# Patient Record
Sex: Female | Born: 1955 | Race: White | Hispanic: No | Smoking: Heavy tobacco smoker
Health system: Southern US, Community
[De-identification: ages and names within clinical notes are randomized; demographics above are authoritative.]

## PROBLEM LIST (undated history)

## (undated) DIAGNOSIS — C801 Malignant (primary) neoplasm, unspecified: Secondary | ICD-10-CM

## (undated) DIAGNOSIS — K859 Acute pancreatitis without necrosis or infection, unspecified: Secondary | ICD-10-CM

## (undated) DIAGNOSIS — F332 Major depressive disorder, recurrent severe without psychotic features: Secondary | ICD-10-CM

## (undated) DIAGNOSIS — F419 Anxiety disorder, unspecified: Secondary | ICD-10-CM

## (undated) DIAGNOSIS — R0600 Dyspnea, unspecified: Secondary | ICD-10-CM

## (undated) DIAGNOSIS — K449 Diaphragmatic hernia without obstruction or gangrene: Secondary | ICD-10-CM

## (undated) DIAGNOSIS — K432 Incisional hernia without obstruction or gangrene: Secondary | ICD-10-CM

## (undated) DIAGNOSIS — K863 Pseudocyst of pancreas: Secondary | ICD-10-CM

## (undated) DIAGNOSIS — F10239 Alcohol dependence with withdrawal, unspecified: Secondary | ICD-10-CM

## (undated) DIAGNOSIS — K8689 Other specified diseases of pancreas: Secondary | ICD-10-CM

## (undated) DIAGNOSIS — R109 Unspecified abdominal pain: Secondary | ICD-10-CM

## (undated) DIAGNOSIS — F1010 Alcohol abuse, uncomplicated: Secondary | ICD-10-CM

## (undated) DIAGNOSIS — R112 Nausea with vomiting, unspecified: Secondary | ICD-10-CM

## (undated) DIAGNOSIS — K7689 Other specified diseases of liver: Secondary | ICD-10-CM

## (undated) DIAGNOSIS — R7989 Other specified abnormal findings of blood chemistry: Secondary | ICD-10-CM

## (undated) DIAGNOSIS — Z9889 Other specified postprocedural states: Secondary | ICD-10-CM

## (undated) DIAGNOSIS — R1011 Right upper quadrant pain: Secondary | ICD-10-CM

## (undated) DIAGNOSIS — K5900 Constipation, unspecified: Secondary | ICD-10-CM

## (undated) DIAGNOSIS — F329 Major depressive disorder, single episode, unspecified: Secondary | ICD-10-CM

## (undated) DIAGNOSIS — C3491 Malignant neoplasm of unspecified part of right bronchus or lung: Principal | ICD-10-CM

## (undated) DIAGNOSIS — Z973 Presence of spectacles and contact lenses: Secondary | ICD-10-CM

## (undated) DIAGNOSIS — M5431 Sciatica, right side: Secondary | ICD-10-CM

## (undated) DIAGNOSIS — K259 Gastric ulcer, unspecified as acute or chronic, without hemorrhage or perforation: Secondary | ICD-10-CM

## (undated) DIAGNOSIS — F32A Depression, unspecified: Secondary | ICD-10-CM

## (undated) DIAGNOSIS — K529 Noninfective gastroenteritis and colitis, unspecified: Secondary | ICD-10-CM

## (undated) DIAGNOSIS — R945 Abnormal results of liver function studies: Secondary | ICD-10-CM

## (undated) DIAGNOSIS — L719 Rosacea, unspecified: Secondary | ICD-10-CM

## (undated) DIAGNOSIS — K760 Fatty (change of) liver, not elsewhere classified: Secondary | ICD-10-CM

## (undated) HISTORY — DX: Presence of spectacles and contact lenses: Z97.3

## (undated) HISTORY — DX: Other specified abnormal findings of blood chemistry: R79.89

## (undated) HISTORY — DX: Malignant neoplasm of unspecified part of right bronchus or lung: C34.91

## (undated) HISTORY — DX: Acute pancreatitis without necrosis or infection, unspecified: K85.90

## (undated) HISTORY — DX: Noninfective gastroenteritis and colitis, unspecified: K52.9

## (undated) HISTORY — DX: Rosacea, unspecified: L71.9

## (undated) HISTORY — PX: TUBAL LIGATION: SHX77

## (undated) HISTORY — DX: Abnormal results of liver function studies: R94.5

## (undated) HISTORY — PX: HERNIA REPAIR: SHX51

## (undated) HISTORY — PX: COLONOSCOPY: SHX174

## (undated) HISTORY — PX: CHOLECYSTECTOMY: SHX55

---

## 2003-08-01 ENCOUNTER — Other Ambulatory Visit: Admission: RE | Admit: 2003-08-01 | Discharge: 2003-08-01 | Payer: Self-pay | Admitting: Family Medicine

## 2004-08-14 ENCOUNTER — Other Ambulatory Visit: Admission: RE | Admit: 2004-08-14 | Discharge: 2004-08-14 | Payer: Self-pay | Admitting: Family Medicine

## 2005-08-18 ENCOUNTER — Other Ambulatory Visit: Admission: RE | Admit: 2005-08-18 | Discharge: 2005-08-18 | Payer: Self-pay | Admitting: Family Medicine

## 2006-04-20 HISTORY — PX: DIAGNOSTIC MAMMOGRAM: HXRAD719

## 2006-09-27 ENCOUNTER — Other Ambulatory Visit: Admission: RE | Admit: 2006-09-27 | Discharge: 2006-09-27 | Payer: Self-pay | Admitting: Obstetrics and Gynecology

## 2006-10-20 ENCOUNTER — Emergency Department (HOSPITAL_COMMUNITY): Admission: EM | Admit: 2006-10-20 | Discharge: 2006-10-20 | Payer: Self-pay | Admitting: Emergency Medicine

## 2007-01-02 ENCOUNTER — Emergency Department (HOSPITAL_COMMUNITY): Admission: EM | Admit: 2007-01-02 | Discharge: 2007-01-02 | Payer: Self-pay | Admitting: Emergency Medicine

## 2007-03-24 ENCOUNTER — Emergency Department (HOSPITAL_COMMUNITY): Admission: EM | Admit: 2007-03-24 | Discharge: 2007-03-24 | Payer: Self-pay | Admitting: Emergency Medicine

## 2007-12-07 ENCOUNTER — Encounter: Payer: Self-pay | Admitting: Internal Medicine

## 2007-12-14 ENCOUNTER — Encounter (INDEPENDENT_AMBULATORY_CARE_PROVIDER_SITE_OTHER): Payer: Self-pay | Admitting: *Deleted

## 2007-12-14 ENCOUNTER — Encounter: Admission: RE | Admit: 2007-12-14 | Discharge: 2007-12-14 | Payer: Self-pay | Admitting: Gastroenterology

## 2008-01-06 ENCOUNTER — Encounter: Payer: Self-pay | Admitting: Internal Medicine

## 2008-06-12 ENCOUNTER — Encounter (INDEPENDENT_AMBULATORY_CARE_PROVIDER_SITE_OTHER): Payer: Self-pay | Admitting: *Deleted

## 2008-06-12 ENCOUNTER — Emergency Department (HOSPITAL_COMMUNITY): Admission: EM | Admit: 2008-06-12 | Discharge: 2008-06-12 | Payer: Self-pay | Admitting: Emergency Medicine

## 2008-06-18 ENCOUNTER — Encounter (INDEPENDENT_AMBULATORY_CARE_PROVIDER_SITE_OTHER): Payer: Self-pay | Admitting: *Deleted

## 2008-06-18 ENCOUNTER — Encounter (INDEPENDENT_AMBULATORY_CARE_PROVIDER_SITE_OTHER): Payer: Self-pay | Admitting: Surgery

## 2008-06-18 ENCOUNTER — Ambulatory Visit (HOSPITAL_COMMUNITY): Admission: RE | Admit: 2008-06-18 | Discharge: 2008-06-18 | Payer: Self-pay | Admitting: Surgery

## 2008-06-25 ENCOUNTER — Encounter: Admission: RE | Admit: 2008-06-25 | Discharge: 2008-06-25 | Payer: Self-pay | Admitting: Surgery

## 2008-06-25 ENCOUNTER — Encounter (INDEPENDENT_AMBULATORY_CARE_PROVIDER_SITE_OTHER): Payer: Self-pay | Admitting: *Deleted

## 2008-09-27 ENCOUNTER — Telehealth: Payer: Self-pay | Admitting: Internal Medicine

## 2008-10-01 DIAGNOSIS — F411 Generalized anxiety disorder: Secondary | ICD-10-CM

## 2008-10-01 DIAGNOSIS — K7689 Other specified diseases of liver: Secondary | ICD-10-CM

## 2008-10-01 DIAGNOSIS — K449 Diaphragmatic hernia without obstruction or gangrene: Secondary | ICD-10-CM

## 2008-10-01 DIAGNOSIS — F1021 Alcohol dependence, in remission: Secondary | ICD-10-CM

## 2008-10-01 DIAGNOSIS — R197 Diarrhea, unspecified: Secondary | ICD-10-CM

## 2008-10-01 DIAGNOSIS — K224 Dyskinesia of esophagus: Secondary | ICD-10-CM

## 2008-10-01 HISTORY — DX: Other specified diseases of liver: K76.89

## 2008-10-01 HISTORY — DX: Diaphragmatic hernia without obstruction or gangrene: K44.9

## 2008-10-10 ENCOUNTER — Telehealth: Payer: Self-pay | Admitting: Internal Medicine

## 2008-10-10 ENCOUNTER — Ambulatory Visit: Payer: Self-pay | Admitting: Internal Medicine

## 2008-10-11 LAB — CONVERTED CEMR LAB
Albumin: 3.6 g/dL (ref 3.5–5.2)
BUN: 13 mg/dL (ref 6–23)
Calcium: 9.4 mg/dL (ref 8.4–10.5)
Chloride: 102 meq/L (ref 96–112)
Glucose, Bld: 81 mg/dL (ref 70–99)
IgA: 626 mg/dL — ABNORMAL HIGH (ref 68–378)
Potassium: 4.4 meq/L (ref 3.5–5.1)
Sed Rate: 23 mm/hr — ABNORMAL HIGH (ref 0–22)
Sodium: 139 meq/L (ref 135–145)
Total Protein: 7.6 g/dL (ref 6.0–8.3)

## 2009-01-04 ENCOUNTER — Inpatient Hospital Stay (HOSPITAL_COMMUNITY): Admission: EM | Admit: 2009-01-04 | Discharge: 2009-01-09 | Payer: Self-pay | Admitting: Emergency Medicine

## 2009-04-05 ENCOUNTER — Telehealth: Payer: Self-pay | Admitting: Internal Medicine

## 2009-08-29 ENCOUNTER — Encounter: Payer: Self-pay | Admitting: Internal Medicine

## 2009-09-15 ENCOUNTER — Inpatient Hospital Stay (HOSPITAL_COMMUNITY): Admission: EM | Admit: 2009-09-15 | Discharge: 2009-09-16 | Payer: Self-pay | Admitting: Emergency Medicine

## 2009-09-15 ENCOUNTER — Encounter (INDEPENDENT_AMBULATORY_CARE_PROVIDER_SITE_OTHER): Payer: Self-pay | Admitting: Internal Medicine

## 2009-09-15 ENCOUNTER — Ambulatory Visit: Payer: Self-pay | Admitting: Internal Medicine

## 2009-09-16 ENCOUNTER — Encounter: Payer: Self-pay | Admitting: Internal Medicine

## 2009-09-16 DIAGNOSIS — R0789 Other chest pain: Secondary | ICD-10-CM

## 2009-09-16 DIAGNOSIS — F3289 Other specified depressive episodes: Secondary | ICD-10-CM | POA: Insufficient documentation

## 2009-09-16 DIAGNOSIS — F329 Major depressive disorder, single episode, unspecified: Secondary | ICD-10-CM

## 2009-09-21 ENCOUNTER — Emergency Department (HOSPITAL_COMMUNITY): Admission: EM | Admit: 2009-09-21 | Discharge: 2009-09-22 | Payer: Self-pay | Admitting: Emergency Medicine

## 2009-09-24 ENCOUNTER — Telehealth (INDEPENDENT_AMBULATORY_CARE_PROVIDER_SITE_OTHER): Payer: Self-pay | Admitting: *Deleted

## 2010-01-17 ENCOUNTER — Inpatient Hospital Stay (HOSPITAL_COMMUNITY): Admission: EM | Admit: 2010-01-17 | Discharge: 2010-02-06 | Payer: Self-pay | Admitting: Emergency Medicine

## 2010-02-18 HISTORY — PX: SHOULDER SURGERY: SHX246

## 2010-03-02 ENCOUNTER — Emergency Department (HOSPITAL_COMMUNITY): Admission: EM | Admit: 2010-03-02 | Discharge: 2010-03-02 | Payer: Self-pay | Admitting: Emergency Medicine

## 2010-03-03 ENCOUNTER — Emergency Department (HOSPITAL_COMMUNITY)
Admission: EM | Admit: 2010-03-03 | Discharge: 2010-03-04 | Payer: Self-pay | Source: Home / Self Care | Admitting: Emergency Medicine

## 2010-03-06 ENCOUNTER — Ambulatory Visit (HOSPITAL_COMMUNITY): Admission: RE | Admit: 2010-03-06 | Discharge: 2010-03-07 | Payer: Self-pay | Admitting: Orthopedic Surgery

## 2010-05-10 ENCOUNTER — Encounter: Payer: Self-pay | Admitting: Internal Medicine

## 2010-05-11 ENCOUNTER — Encounter: Payer: Self-pay | Admitting: Orthopedic Surgery

## 2010-05-22 NOTE — Discharge Summary (Signed)
Summary: Hospital Discharge Update    Hospital Discharge Update:  Date of Admission: 09/15/2009 Date of Discharge: 09/16/2009  Brief Summary:  Patient was admitted secondary to alcohol intoxication, suicidal toughts and also chest pain. She was r/o for ACS with serial CE'z and also with EKG. Patient was chest pain free at the moment of discharge. She was not suicidal after her alcohol intoxication resolved and the decision to make her follow with [facility] as an outpatient was made; she received information forsupport as an outpatient for alcohol cessation from CSW and also details needed for [facility] appointment. She is unassigned and will like to establish care at the [name].  Patient has also hx of chronic diarrhea after cholecystectomy; if problem continue consider start her on cholestyramine.  Lab or other results pending at discharge:  None  Labs needed at follow-up: Basic metabolic panel  Other follow-up issues:  Make sure patient has follow with [name] and that she is taking her medications as directed.  Problem list changes:  Added new problem of CHEST PAIN, NON-CARDIAC (ICD-786.59) Added new problem of DEPRESSION (ICD-311)  Medication list changes:  Added new medication of VITAMIN B-1 100 MG TABS (THIAMINE HCL) Take 1 tablet by mouth once a day Added new medication of FOLIC ACID 1 MG TABS (FOLIC ACID) Take 1 tablet by mouth once a day Added new medication of CELEXA 20 MG TABS (CITALOPRAM HYDROBROMIDE) Take 1 tablet by mouth once a day Added new medication of ASPIR-LOW 81 MG TBEC (ASPIRIN) Take 1 tablet by mouth once a day   Other patient instructions:  -Call [facility] to arrange hospital followup appointment. [phone]) -Take your medications as prescribed. -Stop drinking. -Follow with your appointment at [name]. -Please call 911 and come to Emergency room if you develop suicidal ideation or worsening depression.

## 2010-05-22 NOTE — Progress Notes (Signed)
Summary: Certified Letter Returned Undelivered Mailed 1st Class  Letter undeliverable. Resent by first class mail. Vara Guardian  September 24, 2009 12:37 PM

## 2010-05-22 NOTE — Progress Notes (Signed)
Summary: Dismissal Letter Mailed Certified 08/30/09  Dismissal Letter sent by certified mail. Vara Guardian  September 24, 2009 12:36 PM

## 2010-05-22 NOTE — Letter (Signed)
Summary: Gastroenterology Discharge Letter  [facility]  [address] [place], [place] [address]   Phone: [phone]  Fax: [phone]             08/29/2009 MRN: [id number]  [name] [address] [place], [place]  [address]  [place]  Dear [name],   I find it necessary to inform you that I will no longer be able to provide medical care to you. It has come to my attention that you requested to see another physician for your gastroentestinal needs. I feel that you should seek another physician to take over your care at this time as I do not think that we have a positive physician-patient relationship and that you have little confidence in our medical ability.  Since your condition requires medical attention, I suggest that you place yourself under the care of another physician without delay. If you desire, I will be available for emergency care for 30 days after you receive this letter.  This should give you ample time to select a physician of your choice from the many competent providers in this area. You may want to call the local medical society or [facility]'s physician referral service [phone]) for their assistance in locating a new physician. With your written authorization, I will make a copy of your medical record available to your new physician.   Sincerely,    Hedwig Morton. Juanda Chance, MD

## 2010-05-22 NOTE — Miscellaneous (Signed)
Summary: Admission H and P  INTERNAL MEDICINE ADMISSION HISTORY AND PHYSICAL First Contact: Brooks Sailors [phone]) Second Contact: Vassie Loll [phone]) Attending: Dr. Rogelia Boga  PCP: none  CC: chest tightness / anxiety / SI  HPI: Pt is a 55 yo F with PMH significant for chronic diarrhea, alcohol abuse, who presents with difficulty breathing, chest tightness, and feeling anxious that has been going on about every other day since February when she started her new job.  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  Her symptoms usually last about 1 hr.  Today in addition to tightness she experienced some pain that was 6-7/10.  Along with SOB, chest tightness, and anxiety, she also experiences N/V and diaphoresis.  Vomit is non-bloody. She has chronic diarrhea every since her GB was removed last year.  She says she can spend up 2 hrs in the bathroom many times a week.   She has been having a depressed mood for about 2 yrs which has worsened recently.  With this she has difficulty sleeping and frequent SI, including tonight.  She has thought about suicide and has planned to either jump off a bridge or buy a gun.  She drinks heavily (4-5 glasses of vodka daily, and more on weekends) but denies ever having any withdrawal symptoms.  ALLERGIES: ! 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 Alcohol Use - yes-as above Occupation: IT trainer Patient is a former smoker. Quit 30 yrs ago. Illicit Drug Use - no    FAMILY HISTORY: No FH of Colon Cancer: Family History of Heart Disease: Father (age 57) Mother is healthy   ROS: as per HPI, also denies any hallucinations.  VITALS: T: 98.2 P: 100 BP: 165/96 R: 24 O2SAT: 95% ON: RA  PHYSICAL EXAM: General:  alert, well-developed, and cooperative to examination, somewhat disheveled Head:  normocephalic and atraumatic.   Eyes:  vision grossly intact, pupils equal, pupils round, pupils reactive to light, no injection, ? mild exophthalmos, EOMI Mouth:  pharynx pink and moist, no erythema, and no exudates.   Neck:  supple, full ROM,  no JVD, and no carotid bruits.   Lungs:  normal respiratory effort, no accessory muscle use, normal breath sounds, no crackles, and no wheezes.  Heart:  normal rate, regular rhythm, no murmur, no gallop, and no rub.   Abdomen:  soft, non-tender, normal bowel sounds, no distention, no guarding, no rebound tenderness, no hepatomegaly, and no splenomegaly.   Msk:  no joint swelling, no joint warmth, and no redness over joints.   Pulses:  2+ DP pulses bilaterally Extremities:  No cyanosis, clubbing, edema  Neurologic:  alert & oriented X3, cranial nerves II-XII intact, strength normal in all extremities, sensation intact to light touch.   Skin:  turgor normal and no rashes.   Psych:  Oriented X3, memory intact for recent and remote, normally interactive, good eye contact,  not anxious appearing, but appears depressed   LABS:  WBC                                      8.3               4.0-10.5         K/uL  RBC                                      4.53              3.87-5.11        MIL/uL  Hemoglobin (HGB)                         15.8       h      12.0-15.0        g/dL  Hematocrit (HCT)                         45.8              36.0-46.0        %  MCV                                      101.0      h       78.0-100.0       fL  MCHC                                     34.4              30.0-36.0        g/dL  RDW                                      12.1              11.5-15.5        %  Platelet Count (PLT)                     256               150-400          K/uL  Neutrophils, %                           53                43-77            %  Lymphocytes, %                           36                12-46            %  Monocytes, %  8                 3-12             %  Eosinophils, %                           2                 0-5              %  Basophils, %                             1                 0-1              %  Neutrophils, Absolute                    4.4               1.7-7.7          K/uL  Lymphocytes, Absolute                    3.0               0.7-4.0          K/uL  Monocytes, Absolute                      0.7               0.1-1.0          K/uL  Eosinophils, Absolute                    0.1               0.0-0.7          K/uL  Basophils, Absolute                      0.1               0.0-0.1          K/uL  CKMB, POC                                <1.0       l      1.0-8.0          ng/mL  Troponin I, POC                          <0.05             0.00-0.09        ng/mL  Myoglobin, POC                           47.4              12-200           ng/mL   Pregnancy, Urine-POC                     NEGATIVE   ACTMN                                    <  10.0      l      10-30            ug/mL   Alcohol                                  290        h      0-10             mg/dL   Lipase                                   22                11-59            U/L   Salicylate                               <4.0              2.8-20.0         mg/dL  Color, Urine                             YELLOW            YELLOW  Appearance                               CLEAR             CLEAR  Specific Gravity                         1.008             1.005-1.030  pH                                        6.0               5.0-8.0  Urine Glucose                            NEGATIVE          NEG              mg/dL  Bilirubin                                NEGATIVE          NEG  Ketones                                  NEGATIVE          NEG              mg/dL  Blood                                    NEGATIVE  NEG  Protein                                  NEGATIVE          NEG              mg/dL  Urobilinogen                             0.2               0.0-1.0          mg/dL  Nitrite                                  NEGATIVE          NEG  Leukocytes                               NEGATIVE          NEG   D-Dimer, Fibrin Derivatives              0.37              0.00-0.48        ug/mL-FEU   Protime ( Prothrombin Time)              12.3              11.6-15.2        seconds  INR                                      0.92              0.00-1.49   PTT(a-Partial Thromboplastn Time)        26                24-37            seconds  Amphetamins                              SEE NOTE.         NDT    NONE DETECTED  Barbiturates                             SEE NOTE.         NDT    Oversized comment, see footnote  1  Benzodiazepines                          SEE NOTE.         NDT    NONE DETECTED  Cocaine                                  SEE NOTE.         NDT    NONE DETECTED  Opiates  SEE NOTE.         NDT    NONE DETECTED  Tetrahydrocannabinol                     SEE NOTE.         NDT    NONE DETECTED  IMAGING: 2 View CXR:  IMPRESSION:   No acute cardiopulmonary process.  ASSESSMENT AND PLAN: 1) Chest tightness- Given it's association with anxiety and its onset with the start of her new stressful job, I think this is most consistent with anxiety.  Concerning is that she gets similar sensations with her most exertional activity (mowing the lawn).  This may represent unstable angina or other ACS.  EKG showed no signs of  ischemia.  Despite her significant GI history, these symptoms do not sound consistent with GERD, gastritis, or DES given their association with shortness of breath, although these are also on the differential.  PE would not be this chronic and recurrent.   Admit to tele ECG cycle CE risk stratify with TSH, Lipids, A1c Stress test tomorrow ASA 325mg  po daily Cardiology consult if rules-in     (Heparin and NTG gtt if so)   2) SI- Plan is to rule pt out for ACS, other cardiorespiratory disease before trying to get her in for inpatient psych.  Her presentation seems consistent with MDD, but EtOH may be a complicating factor.  She is currently intoxicated.  Sitter and suicide precautions.  3) Depression- MDD vs. excessive use of depressant substance, alcohol.  Will also chek a TSH to look for medical causes.  Pt will be seen by psych once ruled out as above.  4) EtOH abuse-  Will monitor with CIWA protocol and give Thiamine and folate.  CSW for cessation counseling.  Check C-met.  5) Chronic diarrhea-  This is likely related to being s/p choecystectomy as the pt suspects as chronic bile drainage can irritate the intestines and cause diarrhea.  Will continue to monitor.  Consider cholestyramine 2 gm daily or two times a day.  6)VTE PROPH: lovenox  Attending Physician: I performed and/or observed a history and physical examination of the patient.  I discussed the case with the residents as noted and reviewed the residents' notes.  I agree with the findings and plan--please refer to the attending physician note for more details.  Signature  Printed Name

## 2010-07-01 LAB — DIFFERENTIAL
Basophils Relative: 2 % — ABNORMAL HIGH (ref 0–1)
Basophils Relative: 1 % (ref 0–1)
Eosinophils Absolute: 0.1 10*3/uL (ref 0.0–0.7)
Eosinophils Absolute: 0.1 10*3/uL (ref 0.0–0.7)
Eosinophils Relative: 2 % (ref 0–5)
Eosinophils Relative: 2 % (ref 0–5)
Lymphs Abs: 2.9 10*3/uL (ref 0.7–4.0)
Lymphs Abs: 1.6 10*3/uL (ref 0.7–4.0)
Monocytes Absolute: 0.6 10*3/uL (ref 0.1–1.0)
Monocytes Relative: 9 % (ref 3–12)
Neutrophils Relative %: 38 % — ABNORMAL LOW (ref 43–77)
Neutrophils Relative %: 64 % (ref 43–77)

## 2010-07-01 LAB — CBC
Hemoglobin: 16.4 g/dL — ABNORMAL HIGH (ref 12.0–15.0)
MCH: 34.2 pg — ABNORMAL HIGH (ref 26.0–34.0)
MCH: 33.2 pg (ref 26.0–34.0)
MCHC: 34.4 g/dL (ref 30.0–36.0)
MCHC: 33.3 g/dL (ref 30.0–36.0)
MCV: 99.2 fL (ref 78.0–100.0)
MCV: 99.8 fL (ref 78.0–100.0)
Platelets: 211 10*3/uL (ref 150–400)
RBC: 4.8 MIL/uL (ref 3.87–5.11)
RBC: 4.25 MIL/uL (ref 3.87–5.11)

## 2010-07-01 LAB — BASIC METABOLIC PANEL
BUN: 2 mg/dL — ABNORMAL LOW (ref 6–23)
CO2: 20 mEq/L (ref 19–32)
CO2: 24 mEq/L (ref 19–32)
Calcium: 8.9 mg/dL (ref 8.4–10.5)
Calcium: 8.8 mg/dL (ref 8.4–10.5)
Chloride: 108 mEq/L (ref 96–112)
Chloride: 108 mEq/L (ref 96–112)
Creatinine, Ser: 0.67 mg/dL (ref 0.4–1.2)
Creatinine, Ser: 0.72 mg/dL (ref 0.4–1.2)
GFR calc Af Amer: >60 mL/min (ref >60)
GFR calc Af Amer: >60 mL/min (ref >60)
Glucose, Bld: 113 mg/dL — ABNORMAL HIGH (ref 70–99)
Glucose, Bld: 109 mg/dL — ABNORMAL HIGH (ref 70–99)
Sodium: 144 mEq/L (ref 135–145)

## 2010-07-01 LAB — RAPID URINE DRUG SCREEN, HOSP PERFORMED
Amphetamines: NOT DETECTED
Opiates: NOT DETECTED
Tetrahydrocannabinol: NOT DETECTED

## 2010-07-01 LAB — URINALYSIS, ROUTINE W REFLEX MICROSCOPIC
Bilirubin Urine: NEGATIVE
Glucose, UA: NEGATIVE mg/dL
Nitrite: NEGATIVE
Specific Gravity, Urine: 1.021 (ref 1.005–1.030)
pH: 5.5 (ref 5.0–8.0)

## 2010-07-01 LAB — SURGICAL PCR SCREEN: MRSA, PCR: NEGATIVE

## 2010-07-01 LAB — PROTIME-INR: Prothrombin Time: 11.6 seconds (ref 11.6–15.2)

## 2010-07-01 LAB — ETHANOL: Alcohol, Ethyl (B): 196 mg/dL — ABNORMAL HIGH (ref 0–10)

## 2010-07-01 LAB — SALICYLATE LEVEL: Salicylate Lvl: <4 mg/dL (ref 2.8–20.0)

## 2010-07-01 LAB — GLUCOSE, CAPILLARY: Glucose-Capillary: 137 mg/dL — ABNORMAL HIGH (ref 70–99)

## 2010-07-01 LAB — ACETAMINOPHEN LEVEL: Acetaminophen (Tylenol), Serum: <10 ug/mL — ABNORMAL LOW (ref 10–30)

## 2010-07-02 LAB — GLUCOSE, CAPILLARY
Glucose-Capillary: 116 mg/dL — ABNORMAL HIGH (ref 70–99)
Glucose-Capillary: 117 mg/dL — ABNORMAL HIGH (ref 70–99)
Glucose-Capillary: 121 mg/dL — ABNORMAL HIGH (ref 70–99)
Glucose-Capillary: 121 mg/dL — ABNORMAL HIGH (ref 70–99)
Glucose-Capillary: 128 mg/dL — ABNORMAL HIGH (ref 70–99)
Glucose-Capillary: 131 mg/dL — ABNORMAL HIGH (ref 70–99)
Glucose-Capillary: 135 mg/dL — ABNORMAL HIGH (ref 70–99)
Glucose-Capillary: 146 mg/dL — ABNORMAL HIGH (ref 70–99)
Glucose-Capillary: 148 mg/dL — ABNORMAL HIGH (ref 70–99)
Glucose-Capillary: 155 mg/dL — ABNORMAL HIGH (ref 70–99)
Glucose-Capillary: 20 mg/dL — CL (ref 70–99)

## 2010-07-02 LAB — CBC
HCT: 36.7 % (ref 36.0–46.0)
HCT: 38.5 % (ref 36.0–46.0)
HCT: 39.3 % (ref 36.0–46.0)
Hemoglobin: 12.4 g/dL (ref 12.0–15.0)
Hemoglobin: 13.2 g/dL (ref 12.0–15.0)
Hemoglobin: 13.5 g/dL (ref 12.0–15.0)
MCV: 101.5 fL — ABNORMAL HIGH (ref 78.0–100.0)
RBC: 3.62 MIL/uL — ABNORMAL LOW (ref 3.87–5.11)
RBC: 3.79 MIL/uL — ABNORMAL LOW (ref 3.87–5.11)
RDW: 11.9 % (ref 11.5–15.5)
WBC: 12.9 10*3/uL — ABNORMAL HIGH (ref 4.0–10.5)
WBC: 13.3 10*3/uL — ABNORMAL HIGH (ref 4.0–10.5)

## 2010-07-02 LAB — DIFFERENTIAL
Basophils Absolute: 0.1 10*3/uL (ref 0.0–0.1)
Basophils Relative: 1 % (ref 0–1)
Eosinophils Absolute: 0.3 10*3/uL (ref 0.0–0.7)
Neutro Abs: 9.6 10*3/uL — ABNORMAL HIGH (ref 1.7–7.7)
Neutrophils Relative %: 75 % (ref 43–77)

## 2010-07-02 LAB — LIPASE, BLOOD: Lipase: 40 U/L (ref 11–59)

## 2010-07-02 LAB — COMPREHENSIVE METABOLIC PANEL
ALT: 11 U/L (ref 0–35)
ALT: 14 U/L (ref 0–35)
ALT: 14 U/L (ref 0–35)
AST: 20 U/L (ref 0–37)
AST: 22 U/L (ref 0–37)
AST: 24 U/L (ref 0–37)
Albumin: 2.7 g/dL — ABNORMAL LOW (ref 3.5–5.2)
Alkaline Phosphatase: 73 U/L (ref 39–117)
Alkaline Phosphatase: 76 U/L (ref 39–117)
Alkaline Phosphatase: 76 U/L (ref 39–117)
BUN: 11 mg/dL (ref 6–23)
CO2: 27 mEq/L (ref 19–32)
CO2: 27 mEq/L (ref 19–32)
CO2: 28 mEq/L (ref 19–32)
Calcium: 9 mg/dL (ref 8.4–10.5)
Chloride: 105 mEq/L (ref 96–112)
Chloride: 106 mEq/L (ref 96–112)
Creatinine, Ser: 0.71 mg/dL (ref 0.4–1.2)
GFR calc Af Amer: >60 mL/min (ref >60)
GFR calc Af Amer: >60 mL/min (ref >60)
GFR calc non Af Amer: >60 mL/min (ref >60)
GFR calc non Af Amer: >60 mL/min (ref >60)
Glucose, Bld: 124 mg/dL — ABNORMAL HIGH (ref 70–99)
Glucose, Bld: 143 mg/dL — ABNORMAL HIGH (ref 70–99)
Potassium: 4.1 mEq/L (ref 3.5–5.1)
Potassium: 4.2 mEq/L (ref 3.5–5.1)
Sodium: 137 mEq/L (ref 135–145)
Sodium: 139 mEq/L (ref 135–145)
Sodium: 139 mEq/L (ref 135–145)
Total Bilirubin: 0.3 mg/dL (ref 0.3–1.2)
Total Bilirubin: 0.4 mg/dL (ref 0.3–1.2)
Total Protein: 6.1 g/dL (ref 6.0–8.3)
Total Protein: 6.7 g/dL (ref 6.0–8.3)

## 2010-07-02 LAB — BASIC METABOLIC PANEL
BUN: 8 mg/dL (ref 6–23)
CO2: 29 mEq/L (ref 19–32)
Chloride: 104 mEq/L (ref 96–112)
Creatinine, Ser: 0.61 mg/dL (ref 0.4–1.2)
Glucose, Bld: 122 mg/dL — ABNORMAL HIGH (ref 70–99)
Potassium: 4 mEq/L (ref 3.5–5.1)

## 2010-07-02 LAB — PREALBUMIN: Prealbumin: 9.4 mg/dL — ABNORMAL LOW (ref 18.0–45.0)

## 2010-07-02 LAB — MAGNESIUM
Magnesium: 2.1 mg/dL (ref 1.5–2.5)
Magnesium: 2.2 mg/dL (ref 1.5–2.5)

## 2010-07-03 LAB — GLUCOSE, CAPILLARY
Glucose-Capillary: 100 mg/dL — ABNORMAL HIGH (ref 70–99)
Glucose-Capillary: 101 mg/dL — ABNORMAL HIGH (ref 70–99)
Glucose-Capillary: 103 mg/dL — ABNORMAL HIGH (ref 70–99)
Glucose-Capillary: 104 mg/dL — ABNORMAL HIGH (ref 70–99)
Glucose-Capillary: 104 mg/dL — ABNORMAL HIGH (ref 70–99)
Glucose-Capillary: 104 mg/dL — ABNORMAL HIGH (ref 70–99)
Glucose-Capillary: 107 mg/dL — ABNORMAL HIGH (ref 70–99)
Glucose-Capillary: 108 mg/dL — ABNORMAL HIGH (ref 70–99)
Glucose-Capillary: 108 mg/dL — ABNORMAL HIGH (ref 70–99)
Glucose-Capillary: 111 mg/dL — ABNORMAL HIGH (ref 70–99)
Glucose-Capillary: 115 mg/dL — ABNORMAL HIGH (ref 70–99)
Glucose-Capillary: 117 mg/dL — ABNORMAL HIGH (ref 70–99)
Glucose-Capillary: 117 mg/dL — ABNORMAL HIGH (ref 70–99)
Glucose-Capillary: 118 mg/dL — ABNORMAL HIGH (ref 70–99)
Glucose-Capillary: 118 mg/dL — ABNORMAL HIGH (ref 70–99)
Glucose-Capillary: 120 mg/dL — ABNORMAL HIGH (ref 70–99)
Glucose-Capillary: 120 mg/dL — ABNORMAL HIGH (ref 70–99)
Glucose-Capillary: 121 mg/dL — ABNORMAL HIGH (ref 70–99)
Glucose-Capillary: 124 mg/dL — ABNORMAL HIGH (ref 70–99)
Glucose-Capillary: 124 mg/dL — ABNORMAL HIGH (ref 70–99)
Glucose-Capillary: 125 mg/dL — ABNORMAL HIGH (ref 70–99)
Glucose-Capillary: 127 mg/dL — ABNORMAL HIGH (ref 70–99)
Glucose-Capillary: 129 mg/dL — ABNORMAL HIGH (ref 70–99)
Glucose-Capillary: 130 mg/dL — ABNORMAL HIGH (ref 70–99)
Glucose-Capillary: 131 mg/dL — ABNORMAL HIGH (ref 70–99)
Glucose-Capillary: 132 mg/dL — ABNORMAL HIGH (ref 70–99)
Glucose-Capillary: 132 mg/dL — ABNORMAL HIGH (ref 70–99)
Glucose-Capillary: 134 mg/dL — ABNORMAL HIGH (ref 70–99)
Glucose-Capillary: 134 mg/dL — ABNORMAL HIGH (ref 70–99)
Glucose-Capillary: 135 mg/dL — ABNORMAL HIGH (ref 70–99)
Glucose-Capillary: 136 mg/dL — ABNORMAL HIGH (ref 70–99)
Glucose-Capillary: 137 mg/dL — ABNORMAL HIGH (ref 70–99)
Glucose-Capillary: 137 mg/dL — ABNORMAL HIGH (ref 70–99)
Glucose-Capillary: 150 mg/dL — ABNORMAL HIGH (ref 70–99)
Glucose-Capillary: 152 mg/dL — ABNORMAL HIGH (ref 70–99)
Glucose-Capillary: 64 mg/dL — ABNORMAL LOW (ref 70–99)
Glucose-Capillary: 81 mg/dL (ref 70–99)
Glucose-Capillary: 91 mg/dL (ref 70–99)
Glucose-Capillary: 93 mg/dL (ref 70–99)
Glucose-Capillary: 98 mg/dL (ref 70–99)

## 2010-07-03 LAB — DIFFERENTIAL
Basophils Absolute: 0 10*3/uL (ref 0.0–0.1)
Basophils Absolute: 0.1 10*3/uL (ref 0.0–0.1)
Basophils Absolute: 0.1 10*3/uL (ref 0.0–0.1)
Basophils Relative: 1 % (ref 0–1)
Basophils Relative: 1 % (ref 0–1)
Eosinophils Absolute: 0 10*3/uL (ref 0.0–0.7)
Eosinophils Absolute: 0.1 10*3/uL (ref 0.0–0.7)
Eosinophils Absolute: 0.3 10*3/uL (ref 0.0–0.7)
Eosinophils Absolute: 0.3 10*3/uL (ref 0.0–0.7)
Eosinophils Absolute: 0.5 10*3/uL (ref 0.0–0.7)
Eosinophils Relative: 0 % (ref 0–5)
Eosinophils Relative: 1 % (ref 0–5)
Eosinophils Relative: 2 % (ref 0–5)
Eosinophils Relative: 3 % (ref 0–5)
Lymphocytes Relative: 19 % (ref 12–46)
Lymphs Abs: 2 10*3/uL (ref 0.7–4.0)
Lymphs Abs: 2.2 10*3/uL (ref 0.7–4.0)
Lymphs Abs: 2.2 10*3/uL (ref 0.7–4.0)
Monocytes Absolute: 0.6 10*3/uL (ref 0.1–1.0)
Monocytes Absolute: 0.8 10*3/uL (ref 0.1–1.0)
Monocytes Relative: 6 % (ref 3–12)
Monocytes Relative: 7 % (ref 3–12)
Neutrophils Relative %: 74 % (ref 43–77)
Neutrophils Relative %: 75 % (ref 43–77)

## 2010-07-03 LAB — BASIC METABOLIC PANEL
BUN: 1 mg/dL — ABNORMAL LOW (ref 6–23)
BUN: 11 mg/dL (ref 6–23)
BUN: 2 mg/dL — ABNORMAL LOW (ref 6–23)
BUN: 7 mg/dL (ref 6–23)
CO2: 25 mEq/L (ref 19–32)
CO2: 25 mEq/L (ref 19–32)
CO2: 26 mEq/L (ref 19–32)
Calcium: 8.8 mg/dL (ref 8.4–10.5)
Calcium: 9 mg/dL (ref 8.4–10.5)
Chloride: 104 mEq/L (ref 96–112)
Chloride: 106 mEq/L (ref 96–112)
Chloride: 98 mEq/L (ref 96–112)
Creatinine, Ser: 0.62 mg/dL (ref 0.4–1.2)
Creatinine, Ser: 0.64 mg/dL (ref 0.4–1.2)
Creatinine, Ser: 0.64 mg/dL (ref 0.4–1.2)
GFR calc Af Amer: >60 mL/min (ref >60)
GFR calc Af Amer: >60 mL/min (ref >60)
GFR calc non Af Amer: >60 mL/min (ref >60)
GFR calc non Af Amer: >60 mL/min (ref >60)
Glucose, Bld: 101 mg/dL — ABNORMAL HIGH (ref 70–99)
Glucose, Bld: 118 mg/dL — ABNORMAL HIGH (ref 70–99)
Glucose, Bld: 123 mg/dL — ABNORMAL HIGH (ref 70–99)
Glucose, Bld: 125 mg/dL — ABNORMAL HIGH (ref 70–99)
Potassium: 2.8 mEq/L — ABNORMAL LOW (ref 3.5–5.1)
Potassium: 3.4 mEq/L — ABNORMAL LOW (ref 3.5–5.1)
Potassium: 3.7 mEq/L (ref 3.5–5.1)
Potassium: 3.8 mEq/L (ref 3.5–5.1)
Sodium: 136 mEq/L (ref 135–145)
Sodium: 136 mEq/L (ref 135–145)
Sodium: 138 mEq/L (ref 135–145)

## 2010-07-03 LAB — COMPREHENSIVE METABOLIC PANEL
ALT: 15 U/L (ref 0–35)
ALT: 22 U/L (ref 0–35)
ALT: 39 U/L — ABNORMAL HIGH (ref 0–35)
ALT: 40 U/L — ABNORMAL HIGH (ref 0–35)
ALT: 68 U/L — ABNORMAL HIGH (ref 0–35)
ALT: 89 U/L — ABNORMAL HIGH (ref 0–35)
AST: 104 U/L — ABNORMAL HIGH (ref 0–37)
AST: 168 U/L — ABNORMAL HIGH (ref 0–37)
AST: 22 U/L (ref 0–37)
AST: 23 U/L (ref 0–37)
AST: 26 U/L (ref 0–37)
AST: 43 U/L — ABNORMAL HIGH (ref 0–37)
AST: 49 U/L — ABNORMAL HIGH (ref 0–37)
Albumin: 2.7 g/dL — ABNORMAL LOW (ref 3.5–5.2)
Albumin: 2.7 g/dL — ABNORMAL LOW (ref 3.5–5.2)
Albumin: 3.1 g/dL — ABNORMAL LOW (ref 3.5–5.2)
Albumin: 3.5 g/dL (ref 3.5–5.2)
Alkaline Phosphatase: 111 U/L (ref 39–117)
Alkaline Phosphatase: 94 U/L (ref 39–117)
BUN: 1 mg/dL — ABNORMAL LOW (ref 6–23)
CO2: 22 mEq/L (ref 19–32)
CO2: 24 mEq/L (ref 19–32)
CO2: 27 mEq/L (ref 19–32)
CO2: 28 mEq/L (ref 19–32)
Calcium: 8.7 mg/dL (ref 8.4–10.5)
Calcium: 8.8 mg/dL (ref 8.4–10.5)
Calcium: 8.9 mg/dL (ref 8.4–10.5)
Calcium: 9.2 mg/dL (ref 8.4–10.5)
Chloride: 102 mEq/L (ref 96–112)
Chloride: 104 mEq/L (ref 96–112)
Chloride: 104 mEq/L (ref 96–112)
Chloride: 97 mEq/L (ref 96–112)
Chloride: 99 mEq/L (ref 96–112)
Chloride: 99 mEq/L (ref 96–112)
Creatinine, Ser: 0.57 mg/dL (ref 0.4–1.2)
Creatinine, Ser: 0.6 mg/dL (ref 0.4–1.2)
Creatinine, Ser: 0.73 mg/dL (ref 0.4–1.2)
Creatinine, Ser: 0.73 mg/dL (ref 0.4–1.2)
GFR calc Af Amer: >60 mL/min (ref >60)
GFR calc Af Amer: >60 mL/min (ref >60)
GFR calc Af Amer: >60 mL/min (ref >60)
GFR calc Af Amer: >60 mL/min (ref >60)
GFR calc Af Amer: >60 mL/min (ref >60)
GFR calc Af Amer: >60 mL/min (ref >60)
GFR calc Af Amer: >60 mL/min (ref >60)
GFR calc non Af Amer: >60 mL/min (ref >60)
GFR calc non Af Amer: >60 mL/min (ref >60)
GFR calc non Af Amer: >60 mL/min (ref >60)
GFR calc non Af Amer: >60 mL/min (ref >60)
GFR calc non Af Amer: >60 mL/min (ref >60)
Glucose, Bld: 130 mg/dL — ABNORMAL HIGH (ref 70–99)
Potassium: 2.9 mEq/L — ABNORMAL LOW (ref 3.5–5.1)
Potassium: 3.7 mEq/L (ref 3.5–5.1)
Potassium: 3.9 mEq/L (ref 3.5–5.1)
Sodium: 134 mEq/L — ABNORMAL LOW (ref 135–145)
Sodium: 137 mEq/L (ref 135–145)
Sodium: 137 mEq/L (ref 135–145)
Sodium: 138 mEq/L (ref 135–145)
Sodium: 141 mEq/L (ref 135–145)
Total Bilirubin: 0.9 mg/dL (ref 0.3–1.2)
Total Bilirubin: 1.1 mg/dL (ref 0.3–1.2)
Total Bilirubin: 2.1 mg/dL — ABNORMAL HIGH (ref 0.3–1.2)
Total Bilirubin: 2.2 mg/dL — ABNORMAL HIGH (ref 0.3–1.2)
Total Protein: 6.4 g/dL (ref 6.0–8.3)
Total Protein: 7.1 g/dL (ref 6.0–8.3)

## 2010-07-03 LAB — URINALYSIS, ROUTINE W REFLEX MICROSCOPIC
Bilirubin Urine: NEGATIVE
Glucose, UA: NEGATIVE mg/dL
Glucose, UA: NEGATIVE mg/dL
Hgb urine dipstick: NEGATIVE
Ketones, ur: >80 mg/dL — AB
Ketones, ur: >80 mg/dL — AB
Nitrite: POSITIVE — AB
Protein, ur: 100 mg/dL — AB
Protein, ur: NEGATIVE mg/dL
pH: 6.5 (ref 5.0–8.0)
pH: 8 (ref 5.0–8.0)

## 2010-07-03 LAB — URINE CULTURE
Colony Count: 6000
Culture  Setup Time: 201110061613

## 2010-07-03 LAB — CBC
HCT: 34.5 % — ABNORMAL LOW (ref 36.0–46.0)
HCT: 35.1 % — ABNORMAL LOW (ref 36.0–46.0)
HCT: 37.5 % (ref 36.0–46.0)
HCT: 45.4 % (ref 36.0–46.0)
Hemoglobin: 11.9 g/dL — ABNORMAL LOW (ref 12.0–15.0)
Hemoglobin: 12 g/dL (ref 12.0–15.0)
Hemoglobin: 12.4 g/dL (ref 12.0–15.0)
Hemoglobin: 12.8 g/dL (ref 12.0–15.0)
Hemoglobin: 13 g/dL (ref 12.0–15.0)
Hemoglobin: 13.1 g/dL (ref 12.0–15.0)
Hemoglobin: 13.1 g/dL (ref 12.0–15.0)
Hemoglobin: 16.1 g/dL — ABNORMAL HIGH (ref 12.0–15.0)
MCH: 35 pg — ABNORMAL HIGH (ref 26.0–34.0)
MCH: 35.4 pg — ABNORMAL HIGH (ref 26.0–34.0)
MCH: 35.5 pg — ABNORMAL HIGH (ref 26.0–34.0)
MCH: 35.9 pg — ABNORMAL HIGH (ref 26.0–34.0)
MCH: 36.2 pg — ABNORMAL HIGH (ref 26.0–34.0)
MCHC: 34.3 g/dL (ref 30.0–36.0)
MCHC: 34.3 g/dL (ref 30.0–36.0)
MCHC: 34.4 g/dL (ref 30.0–36.0)
MCHC: 34.8 g/dL (ref 30.0–36.0)
MCHC: 35.2 g/dL (ref 30.0–36.0)
MCV: 102.8 fL — ABNORMAL HIGH (ref 78.0–100.0)
MCV: 102.8 fL — ABNORMAL HIGH (ref 78.0–100.0)
MCV: 103.4 fL — ABNORMAL HIGH (ref 78.0–100.0)
Platelets: 104 10*3/uL — ABNORMAL LOW (ref 150–400)
Platelets: 132 10*3/uL — ABNORMAL LOW (ref 150–400)
Platelets: 423 10*3/uL — ABNORMAL HIGH (ref 150–400)
RBC: 3.35 MIL/uL — ABNORMAL LOW (ref 3.87–5.11)
RBC: 3.36 MIL/uL — ABNORMAL LOW (ref 3.87–5.11)
RBC: 3.55 MIL/uL — ABNORMAL LOW (ref 3.87–5.11)
RBC: 3.59 MIL/uL — ABNORMAL LOW (ref 3.87–5.11)
RBC: 3.64 MIL/uL — ABNORMAL LOW (ref 3.87–5.11)
RBC: 3.66 MIL/uL — ABNORMAL LOW (ref 3.87–5.11)
RBC: 3.7 MIL/uL — ABNORMAL LOW (ref 3.87–5.11)
RBC: 3.7 MIL/uL — ABNORMAL LOW (ref 3.87–5.11)
RBC: 4.47 MIL/uL (ref 3.87–5.11)
RDW: 12 % (ref 11.5–15.5)
RDW: 12.2 % (ref 11.5–15.5)
RDW: 12.2 % (ref 11.5–15.5)
RDW: 12.3 % (ref 11.5–15.5)
RDW: 12.3 % (ref 11.5–15.5)
RDW: 12.4 % (ref 11.5–15.5)
WBC: 10.9 10*3/uL — ABNORMAL HIGH (ref 4.0–10.5)
WBC: 11.9 10*3/uL — ABNORMAL HIGH (ref 4.0–10.5)
WBC: 12 10*3/uL — ABNORMAL HIGH (ref 4.0–10.5)
WBC: 12.1 10*3/uL — ABNORMAL HIGH (ref 4.0–10.5)
WBC: 12.9 10*3/uL — ABNORMAL HIGH (ref 4.0–10.5)
WBC: 13.8 10*3/uL — ABNORMAL HIGH (ref 4.0–10.5)

## 2010-07-03 LAB — AMYLASE: Amylase: 75 U/L (ref 0–105)

## 2010-07-03 LAB — MAGNESIUM
Magnesium: 1.5 mg/dL (ref 1.5–2.5)
Magnesium: 1.7 mg/dL (ref 1.5–2.5)
Magnesium: 2.1 mg/dL (ref 1.5–2.5)
Magnesium: 2.1 mg/dL (ref 1.5–2.5)

## 2010-07-03 LAB — TRIGLYCERIDES: Triglycerides: 151 mg/dL — ABNORMAL HIGH (ref <150)

## 2010-07-03 LAB — URINE MICROSCOPIC-ADD ON

## 2010-07-03 LAB — PHOSPHORUS
Phosphorus: 3.8 mg/dL (ref 2.3–4.6)
Phosphorus: 4.3 mg/dL (ref 2.3–4.6)
Phosphorus: 4.5 mg/dL (ref 2.3–4.6)

## 2010-07-03 LAB — LIPASE, BLOOD: Lipase: 323 U/L — ABNORMAL HIGH (ref 11–59)

## 2010-07-03 LAB — PREALBUMIN: Prealbumin: 9.2 mg/dL — ABNORMAL LOW (ref 18.0–45.0)

## 2010-07-03 LAB — PROTIME-INR
INR: 0.88 (ref 0.00–1.49)
Prothrombin Time: 12.1 seconds (ref 11.6–15.2)

## 2010-07-07 LAB — CBC
HCT: 44.5 % (ref 36.0–46.0)
Hemoglobin: 15.4 g/dL — ABNORMAL HIGH (ref 12.0–15.0)
MCHC: 34.6 g/dL (ref 30.0–36.0)
MCHC: 34.4 g/dL (ref 30.0–36.0)
MCV: 101.2 fL — ABNORMAL HIGH (ref 78.0–100.0)
Platelets: 260 10*3/uL (ref 150–400)
Platelets: 256 10*3/uL (ref 150–400)
RBC: 4.39 MIL/uL (ref 3.87–5.11)
RDW: 12.6 % (ref 11.5–15.5)
RDW: 12.1 % (ref 11.5–15.5)
WBC: 7.8 10*3/uL (ref 4.0–10.5)

## 2010-07-07 LAB — POCT CARDIAC MARKERS
CKMB, poc: <1 ng/mL — ABNORMAL LOW (ref 1.0–8.0)
CKMB, poc: <1 ng/mL — ABNORMAL LOW (ref 1.0–8.0)
Myoglobin, poc: 39.8 ng/mL (ref 12–200)
Myoglobin, poc: 47.4 ng/mL (ref 12–200)
Troponin i, poc: <0.05 ng/mL (ref 0.00–0.09)

## 2010-07-07 LAB — ETHANOL
Alcohol, Ethyl (B): 305 mg/dL — ABNORMAL HIGH (ref 0–10)
Alcohol, Ethyl (B): <5 mg/dL (ref 0–10)
Alcohol, Ethyl (B): 290 mg/dL — ABNORMAL HIGH (ref 0–10)

## 2010-07-07 LAB — PHOSPHORUS: Phosphorus: 3 mg/dL (ref 2.3–4.6)

## 2010-07-07 LAB — COMPREHENSIVE METABOLIC PANEL WITH GFR
ALT: 57 U/L — ABNORMAL HIGH (ref 0–35)
Alkaline Phosphatase: 94 U/L (ref 39–117)
CO2: 27 meq/L (ref 19–32)
Glucose, Bld: 101 mg/dL — ABNORMAL HIGH (ref 70–99)
Potassium: 3.7 meq/L (ref 3.5–5.1)
Sodium: 143 meq/L (ref 135–145)
Total Protein: 7.3 g/dL (ref 6.0–8.3)

## 2010-07-07 LAB — DIFFERENTIAL
Basophils Absolute: 0.1 10*3/uL (ref 0.0–0.1)
Basophils Absolute: 0.1 10*3/uL (ref 0.0–0.1)
Basophils Relative: 1 % (ref 0–1)
Basophils Relative: 1 % (ref 0–1)
Eosinophils Absolute: 0.2 K/uL (ref 0.0–0.7)
Eosinophils Relative: 2 % (ref 0–5)
Lymphocytes Relative: 42 % (ref 12–46)
Lymphocytes Relative: 36 % (ref 12–46)
Lymphs Abs: 3.2 10*3/uL (ref 0.7–4.0)
Monocytes Absolute: 0.7 10*3/uL (ref 0.1–1.0)
Monocytes Relative: 9 % (ref 3–12)
Neutro Abs: 3.6 10*3/uL (ref 1.7–7.7)
Neutro Abs: 4.4 10*3/uL (ref 1.7–7.7)
Neutrophils Relative %: 47 % (ref 43–77)
Neutrophils Relative %: 53 % (ref 43–77)

## 2010-07-07 LAB — URINALYSIS, ROUTINE W REFLEX MICROSCOPIC
Bilirubin Urine: NEGATIVE
Glucose, UA: NEGATIVE mg/dL
Hgb urine dipstick: NEGATIVE
Ketones, ur: NEGATIVE mg/dL
Nitrite: NEGATIVE
Protein, ur: NEGATIVE mg/dL
Protein, ur: NEGATIVE mg/dL
Specific Gravity, Urine: 1.009 (ref 1.005–1.030)
Specific Gravity, Urine: 1.008 (ref 1.005–1.030)
Urobilinogen, UA: 0.2 mg/dL (ref 0.0–1.0)
pH: 6 (ref 5.0–8.0)
pH: 6 (ref 5.0–8.0)

## 2010-07-07 LAB — COMPREHENSIVE METABOLIC PANEL
ALT: 56 U/L — ABNORMAL HIGH (ref 0–35)
AST: 72 U/L — ABNORMAL HIGH (ref 0–37)
AST: 85 U/L — ABNORMAL HIGH (ref 0–37)
Albumin: 3.5 g/dL (ref 3.5–5.2)
Alkaline Phosphatase: 82 U/L (ref 39–117)
BUN: 5 mg/dL — ABNORMAL LOW (ref 6–23)
CO2: 23 mEq/L (ref 19–32)
Calcium: 8.9 mg/dL (ref 8.4–10.5)
Calcium: 8.2 mg/dL — ABNORMAL LOW (ref 8.4–10.5)
Chloride: 108 mEq/L (ref 96–112)
Creatinine, Ser: 0.65 mg/dL (ref 0.4–1.2)
GFR calc Af Amer: >60 mL/min (ref >60)
GFR calc Af Amer: >60 mL/min (ref >60)
GFR calc non Af Amer: >60 mL/min (ref >60)
Glucose, Bld: 95 mg/dL (ref 70–99)
Potassium: 3.8 mEq/L (ref 3.5–5.1)
Sodium: 140 mEq/L (ref 135–145)
Total Bilirubin: 0.3 mg/dL (ref 0.3–1.2)
Total Protein: 6.4 g/dL (ref 6.0–8.3)

## 2010-07-07 LAB — LIPID PANEL
Cholesterol: 241 mg/dL — ABNORMAL HIGH (ref 0–200)
LDL Cholesterol: 128 mg/dL — ABNORMAL HIGH (ref 0–99)
Triglycerides: 190 mg/dL — ABNORMAL HIGH (ref <150)

## 2010-07-07 LAB — POCT I-STAT, CHEM 8
BUN: 9 mg/dL (ref 6–23)
Creatinine, Ser: 1.1 mg/dL (ref 0.4–1.2)
Glucose, Bld: 105 mg/dL — ABNORMAL HIGH (ref 70–99)
Hemoglobin: 16.7 g/dL — ABNORMAL HIGH (ref 12.0–15.0)
TCO2: 26 mmol/L (ref 0–100)

## 2010-07-07 LAB — PROTIME-INR
INR: 0.92 (ref 0.00–1.49)
Prothrombin Time: 12.3 seconds (ref 11.6–15.2)

## 2010-07-07 LAB — RAPID URINE DRUG SCREEN, HOSP PERFORMED
Amphetamines: NOT DETECTED
Amphetamines: NOT DETECTED
Barbiturates: NOT DETECTED
Benzodiazepines: NOT DETECTED
Benzodiazepines: NOT DETECTED
Cocaine: NOT DETECTED
Opiates: NOT DETECTED
Tetrahydrocannabinol: NOT DETECTED
Tetrahydrocannabinol: NOT DETECTED

## 2010-07-07 LAB — CK TOTAL AND CKMB (NOT AT ARMC)
CK, MB: 0.7 ng/mL (ref 0.3–4.0)
Relative Index: INVALID (ref 0.0–2.5)
Relative Index: INVALID (ref 0.0–2.5)
Total CK: 32 U/L (ref 7–177)
Total CK: 38 U/L (ref 7–177)

## 2010-07-07 LAB — D-DIMER, QUANTITATIVE: D-Dimer, Quant: 0.37 ug/mL-FEU (ref 0.00–0.48)

## 2010-07-07 LAB — URINE MICROSCOPIC-ADD ON

## 2010-07-07 LAB — POCT PREGNANCY, URINE: Preg Test, Ur: NEGATIVE

## 2010-07-07 LAB — TROPONIN I
Troponin I: 0.01 ng/mL (ref 0.00–0.06)
Troponin I: 0.01 ng/mL (ref 0.00–0.06)

## 2010-07-07 LAB — ACETAMINOPHEN LEVEL: Acetaminophen (Tylenol), Serum: <10 ug/mL — ABNORMAL LOW (ref 10–30)

## 2010-07-25 LAB — COMPREHENSIVE METABOLIC PANEL
ALT: 52 U/L — ABNORMAL HIGH (ref 0–35)
ALT: 60 U/L — ABNORMAL HIGH (ref 0–35)
AST: 41 U/L — ABNORMAL HIGH (ref 0–37)
AST: 61 U/L — ABNORMAL HIGH (ref 0–37)
AST: 91 U/L — ABNORMAL HIGH (ref 0–37)
Albumin: 2.7 g/dL — ABNORMAL LOW (ref 3.5–5.2)
Albumin: 3 g/dL — ABNORMAL LOW (ref 3.5–5.2)
Albumin: 3.3 g/dL — ABNORMAL LOW (ref 3.5–5.2)
Alkaline Phosphatase: 84 U/L (ref 39–117)
CO2: 27 mEq/L (ref 19–32)
Calcium: 7.7 mg/dL — ABNORMAL LOW (ref 8.4–10.5)
Calcium: 8.1 mg/dL — ABNORMAL LOW (ref 8.4–10.5)
Calcium: 9.1 mg/dL (ref 8.4–10.5)
Chloride: 106 mEq/L (ref 96–112)
Creatinine, Ser: 0.66 mg/dL (ref 0.4–1.2)
Creatinine, Ser: 0.73 mg/dL (ref 0.4–1.2)
GFR calc Af Amer: >60 mL/min (ref >60)
GFR calc Af Amer: >60 mL/min (ref >60)
GFR calc non Af Amer: >60 mL/min (ref >60)
Glucose, Bld: 103 mg/dL — ABNORMAL HIGH (ref 70–99)
Glucose, Bld: 123 mg/dL — ABNORMAL HIGH (ref 70–99)
Potassium: 3.6 mEq/L (ref 3.5–5.1)
Sodium: 135 mEq/L (ref 135–145)
Sodium: 137 mEq/L (ref 135–145)
Sodium: 140 mEq/L (ref 135–145)
Total Bilirubin: 1.1 mg/dL (ref 0.3–1.2)
Total Protein: 6.2 g/dL (ref 6.0–8.3)
Total Protein: 7 g/dL (ref 6.0–8.3)

## 2010-07-25 LAB — CBC
Hemoglobin: 13 g/dL (ref 12.0–15.0)
MCHC: 34.3 g/dL (ref 30.0–36.0)
MCHC: 34.3 g/dL (ref 30.0–36.0)
MCV: 104.1 fL — ABNORMAL HIGH (ref 78.0–100.0)
MCV: 104.3 fL — ABNORMAL HIGH (ref 78.0–100.0)
Platelets: 200 10*3/uL (ref 150–400)
Platelets: 225 10*3/uL (ref 150–400)
RBC: 3.63 MIL/uL — ABNORMAL LOW (ref 3.87–5.11)
RDW: 13 % (ref 11.5–15.5)
WBC: 8.1 10*3/uL (ref 4.0–10.5)

## 2010-07-25 LAB — URINE MICROSCOPIC-ADD ON

## 2010-07-25 LAB — HEPATITIS PANEL, ACUTE
HCV Ab: NEGATIVE
Hep A IgM: NEGATIVE

## 2010-07-25 LAB — URINALYSIS, ROUTINE W REFLEX MICROSCOPIC
Glucose, UA: NEGATIVE mg/dL
Hgb urine dipstick: NEGATIVE
Ketones, ur: 40 mg/dL — AB
pH: 7 (ref 5.0–8.0)

## 2010-07-25 LAB — CERULOPLASMIN: Ceruloplasmin: 23 mg/dL (ref 21–63)

## 2010-07-25 LAB — LIPASE, BLOOD
Lipase: 154 U/L — ABNORMAL HIGH (ref 11–59)
Lipase: 23 U/L (ref 11–59)

## 2010-07-25 LAB — RETICULIN ANTIBODIES, IGA W TITER: Reticulin Ab, IgA: NEGATIVE

## 2010-07-25 LAB — DIFFERENTIAL
Eosinophils Absolute: 0.1 10*3/uL (ref 0.0–0.7)
Lymphs Abs: 1.6 10*3/uL (ref 0.7–4.0)
Monocytes Absolute: 0.9 10*3/uL (ref 0.1–1.0)
Monocytes Relative: 7 % (ref 3–12)
Neutrophils Relative %: 78 % — ABNORMAL HIGH (ref 43–77)

## 2010-07-25 LAB — LIPID PANEL
Cholesterol: 229 mg/dL — ABNORMAL HIGH (ref 0–200)
LDL Cholesterol: 121 mg/dL — ABNORMAL HIGH (ref 0–99)
VLDL: 14 mg/dL (ref 0–40)

## 2010-07-25 LAB — IRON AND TIBC
Iron: 75 ug/dL (ref 42–135)
TIBC: 241 ug/dL — ABNORMAL LOW (ref 250–470)

## 2010-07-25 LAB — TISSUE TRANSGLUTAMINASE, IGA: Tissue Transglutaminase Ab, IgA: 1.3 U/mL (ref <7)

## 2010-07-25 LAB — FERRITIN: Ferritin: 326 ng/mL — ABNORMAL HIGH (ref 10–291)

## 2010-07-25 LAB — ANA: Anti Nuclear Antibody(ANA): POSITIVE — AB

## 2010-08-05 LAB — URINALYSIS, ROUTINE W REFLEX MICROSCOPIC
Glucose, UA: NEGATIVE mg/dL
Ketones, ur: 15 mg/dL — AB
Protein, ur: NEGATIVE mg/dL

## 2010-08-05 LAB — COMPREHENSIVE METABOLIC PANEL
ALT: 46 U/L — ABNORMAL HIGH (ref 0–35)
AST: 71 U/L — ABNORMAL HIGH (ref 0–37)
Alkaline Phosphatase: 101 U/L (ref 39–117)
CO2: 26 mEq/L (ref 19–32)
Chloride: 96 mEq/L (ref 96–112)
Creatinine, Ser: 0.7 mg/dL (ref 0.4–1.2)
GFR calc Af Amer: >60 mL/min (ref >60)
GFR calc non Af Amer: >60 mL/min (ref >60)
Potassium: 2.7 mEq/L — CL (ref 3.5–5.1)
Sodium: 133 mEq/L — ABNORMAL LOW (ref 135–145)
Total Bilirubin: 1.2 mg/dL (ref 0.3–1.2)

## 2010-08-05 LAB — BASIC METABOLIC PANEL
BUN: 5 mg/dL — ABNORMAL LOW (ref 6–23)
Creatinine, Ser: 0.67 mg/dL (ref 0.4–1.2)
GFR calc non Af Amer: >60 mL/min (ref >60)

## 2010-08-05 LAB — HEMOGLOBIN AND HEMATOCRIT, BLOOD: HCT: 42.7 % (ref 36.0–46.0)

## 2010-08-05 LAB — LIPASE, BLOOD: Lipase: 155 U/L — ABNORMAL HIGH (ref 11–59)

## 2010-08-05 LAB — CBC
MCV: 106.4 fL — ABNORMAL HIGH (ref 78.0–100.0)
RBC: 3.83 MIL/uL — ABNORMAL LOW (ref 3.87–5.11)
WBC: 13.2 10*3/uL — ABNORMAL HIGH (ref 4.0–10.5)

## 2010-08-05 LAB — DIFFERENTIAL
Basophils Absolute: 0 10*3/uL (ref 0.0–0.1)
Basophils Relative: 0 % (ref 0–1)
Eosinophils Absolute: 0.2 10*3/uL (ref 0.0–0.7)
Eosinophils Relative: 2 % (ref 0–5)

## 2010-08-05 LAB — URINE MICROSCOPIC-ADD ON

## 2010-08-05 LAB — PREGNANCY, URINE: Preg Test, Ur: NEGATIVE

## 2010-08-19 ENCOUNTER — Ambulatory Visit (INDEPENDENT_AMBULATORY_CARE_PROVIDER_SITE_OTHER): Payer: Commercial Managed Care - PPO | Admitting: Medical

## 2010-08-19 DIAGNOSIS — R7989 Other specified abnormal findings of blood chemistry: Secondary | ICD-10-CM

## 2010-08-19 DIAGNOSIS — K117 Disturbances of salivary secretion: Secondary | ICD-10-CM

## 2010-08-19 DIAGNOSIS — R030 Elevated blood-pressure reading, without diagnosis of hypertension: Secondary | ICD-10-CM

## 2010-08-19 DIAGNOSIS — L723 Sebaceous cyst: Secondary | ICD-10-CM

## 2010-08-23 ENCOUNTER — Encounter: Payer: Self-pay | Admitting: Medical

## 2010-09-01 ENCOUNTER — Ambulatory Visit (INDEPENDENT_AMBULATORY_CARE_PROVIDER_SITE_OTHER): Payer: Commercial Managed Care - PPO | Admitting: Medical

## 2010-09-01 ENCOUNTER — Encounter: Payer: Self-pay | Admitting: Medical

## 2010-09-01 ENCOUNTER — Other Ambulatory Visit: Payer: Self-pay | Admitting: Medical

## 2010-09-01 VITALS — BP 150/100 | HR 80 | Ht 66.0 in | Wt 154.0 lb

## 2010-09-01 DIAGNOSIS — Z1211 Encounter for screening for malignant neoplasm of colon: Secondary | ICD-10-CM

## 2010-09-01 DIAGNOSIS — R7989 Other specified abnormal findings of blood chemistry: Secondary | ICD-10-CM

## 2010-09-01 DIAGNOSIS — R030 Elevated blood-pressure reading, without diagnosis of hypertension: Secondary | ICD-10-CM

## 2010-09-01 DIAGNOSIS — R197 Diarrhea, unspecified: Secondary | ICD-10-CM

## 2010-09-01 DIAGNOSIS — K529 Noninfective gastroenteritis and colitis, unspecified: Secondary | ICD-10-CM | POA: Insufficient documentation

## 2010-09-01 DIAGNOSIS — K117 Disturbances of salivary secretion: Secondary | ICD-10-CM

## 2010-09-01 DIAGNOSIS — R682 Dry mouth, unspecified: Secondary | ICD-10-CM | POA: Insufficient documentation

## 2010-09-01 DIAGNOSIS — Z0000 Encounter for general adult medical examination without abnormal findings: Secondary | ICD-10-CM

## 2010-09-01 LAB — POCT URINALYSIS DIPSTICK
Bilirubin, UA: NEGATIVE
Glucose, UA: NEGATIVE
Leukocytes, UA: NEGATIVE
Nitrite, UA: NEGATIVE
Urobilinogen, UA: NEGATIVE

## 2010-09-01 LAB — HEPATITIS C ANTIBODY: HCV Ab: NEGATIVE

## 2010-09-01 LAB — LIPID PANEL
HDL: 82 mg/dL (ref >39)
LDL Cholesterol: 97 mg/dL (ref 0–99)
Total CHOL/HDL Ratio: 2.5 Ratio
VLDL: 24 mg/dL (ref 0–40)

## 2010-09-01 NOTE — Progress Notes (Signed)
Subjective:   HPI Here for complete physical and recheck from her May 1 visit. Here to discuss the recent lab work we did. Her main complaints are still extreme dryness of her mouth, dry skin, dry eyes. We're also rechecking on her elevated liver tests from last visit. She notes history of elevated liver tests with a pancreatitis episode last year when she was hospitalized, but the labs have not been rechecked since now. She has an appointment pending with orthopedic for her finger cyst. She notes some crusting changing skin lesions on her arms. She denies history of colonoscopy. Otherwise she has been in her usual state of health. She drinks a lot of water.  Past Medical History  Diagnosis Date  . GERD (gastroesophageal reflux disease)   . Rosacea   . Elevated liver function tests   . Wears glasses   . Chronic diarrhea    colonoscopy-never; last mammogram and Pap smear 2008  last menstrual period 2008.  Review of Systems Review of Systems Constitutional: denies fever, chills, sweats, unexpected weight change, anorexia, fatigue Allergy: negative; denies recent sneezing, itching, congestion Dermatology: Positive crusting lesions on the arms, history of sunbathing use when younger ENT: Positive for dry mouth; no runny nose, ear pain, sore throat, hoarseness, sinus pain, teeth pain, tinnitus, hearing loss, epistaxis Cardiology: denies chest pain, palpitations, edema, orthopnea, paroxysmal nocturnal dyspnea Respiratory: denies cough, shortness of breath, dyspnea on exertion, wheezing, hemoptysis Gastroenterology: Positive for chronic diarrhea; denies abdominal pain, nausea, vomiting, constipation, blood in stool, changes in bowel movement, dysphagia Hematology: denies bleeding or bruising problems Musculoskeletal: Positive for right third finger cyst causing pain; otherwise, denies arthralgias, myalgias, joint swelling, back pain, neck pain, cramping, gait changes Ophthalmology: Positive  dryness; denies vision changes, eye redness, itching, discharge Urology: denies dysuria, difficulty urinating, hematuria, urinary frequency, urgency, incontinence Neurology: no headache, weakness, tingling, numbness, speech abnormality, memory loss, falls, dizziness Psychology: denies depressed mood, agitation, sleep problems   Objective:   Physical Exam  BP 150/100  Pulse 80  Ht 5\' 6"  (1.676 m)  Wt 154 lb (69.854 kg)  BMI 24.86 kg/m2  General Appearance:    Alert, cooperative, white female in no distress, appears stated age  Head:    Normocephalic, without obvious abnormality, atraumatic  Eyes:    PERRL, conjunctiva/corneas clear, EOM's intact, fundi    benign  Ears:    Normal TM's and external ear canals  Nose:   Nares normal, mucosa normal, no drainage or sinus   tenderness  Throat:   left buccal mucosa with 1 mm amorphous reddish-brown lesion, similar to small patches of erythema of the right buccal mucosa that does seem to be small hemorrhages possibly from teeth, otherwise lips, mucosa, and tongue normal; teeth and gums normal  Neck:   Supple, no lymphadenopathy;  thyroid:  no   enlargement/tenderness/nodules; no carotid   bruit or JVD  Back:    Spine nontender, no curvature, ROM normal, no CVA     tenderness  Lungs:     Clear to auscultation bilaterally without wheezes, rales or     ronchi; respirations unlabored   Heart:    Regular rate and rhythm, S1 and S2 normal, no murmur, rub   or gallop  Breast Exam:    Deferred   Abdomen:     positive for right upper quadrant surgical scars, Soft, non-tender, non distended, normoactive bowel sounds,    no masses, no hepatosplenomegaly  Genitalia:    Deferred      Extremities:  right volar third finger proximal phalanx with 3 mm round mobile cystic nodule, otherwise upper and lower extremities without tenderness or obvious deformity; No clubbing, cyanosis or edema  Pulses:   2+ and symmetric all extremities  Skin:   bilateral dorsal  forearms with scattered flat brown and pinkish circular to amorphous lesions that seem consistent with solar keratoses, there are a few scattered 2-3 mm round and somewhat scaling slightly raised brownish lesions that most likely represent actinic keratoses, other scattered benign appearing macules of chest back and neck and arms; otherwise skin color, texture, turgor normal, no rashes or lesions  Lymph nodes:   submandibular bilateral somewhat enlarged lymph nodes, nontender, otherwise no enlarged cervical, supraclavicular,  nodes   Neurologic:   CNII-XII intact, normal strength, sensation and gait; reflexes 2+ and symmetric throughout          Psych:   Normal mood, affect, hygiene and grooming.           Assessment & Plan:    Encounter Diagnoses  Name Primary?  . Annual physical exam Yes  . Elevated blood pressure (not hypertension)   . Mouth dryness   . Elevated liver function tests   . Encounter for screening colonoscopy   . Chronic diarrhea     Annual exam-  Discussed healthy lifestyle, diet, exercise, screening and prevention, handout given  In general avoid adding salt to diet, exercise regularly, eat healthy  We can schedule an appointment later regarding mammogram, pap smear, etc, once we have a better handle on your current concerns  Elevated blood pressure-I recommended we start medication today, and discussed risk of uncontrolled hypertension. Instead, she will check her blood pressure at random times, and get me copy of the readings in 1 week.  If average blood pressures are > 120/80, then I will recommend we begin medication to control your blood pressure.    Mouth dryness-additional lab work today, continue drinking water regularly  Elevated LFTs-likely related to her alcohol use, and that she drink no more than one drink per day, additional lab work today  Screening-we have made a referral for her first screening colonoscopy, and to evaluate her chronic  diarrhea  Cyst of finger-she has an appointment pending with orthopedic per last visit on May 1  Skin surveillance-she sees Dr. Terri Piedra in will schedule appointment with him; she has some scattered solar keratoses and actinic keratoses on her arms  Lymph nodes submandibular syncope slightly swollen; we will recheck this at next visit or sooner if she notices them not resolving  Of note, in addition to listed labs below, I also ordered send out test: Anti-scleroderma 70 antibody, anti-centromere antibody.  We will call with lab test results

## 2010-09-01 NOTE — Patient Instructions (Signed)
Check your blood pressure at random times, and get me copy of the readings in 1 week.  If average blood pressures are > 120/80, then I will recommend we begin medication to control your blood pressure.    In general avoid adding salt to diet, exercise regularly, eat healthy  We will call with the additional lab results  We can schedule an appointment later regarding mammogram, pap smear, etc, once we have a better handle on your current concerns   Preventative Care for Adults - Female      MAINTAIN REGULAR HEALTH EXAMS: A routine yearly physical is a good way to check in with your primary care provider about your health and preventive screening. It is also an opportunity to share updates about your health and any concerns you have, and receive a thorough all-over exam.  Most health insurance companies pay for at least some preventative services.  Check with your health plan for specific coverages.  WHAT PREVENTATIVE SERVICES DO WOMEN NEED? Adult women should have their weight and blood pressure checked regularly.  Women age 54 and older should have their cholesterol levels checked regularly. Women should be screened for cervical cancer with a Pap smear and pelvic exam beginning at either age 48, or 3 years after they become sexually activity.   Breast cancer screening generally begins at age 30 with a mammogram and breast exam by your primary care provider.   Beginning at age 53 and continuing to age 77, women should be screened for colorectal cancer.  Certain people may need continued testing until age 34. Updating vaccinations is part of preventative care.  Vaccinations help protect against diseases such as the flu. Osteoporosis is a disease in which the bones lose minerals and strength as we age. Women ages 68 and over should discuss this with their caregivers, as should women after menopause who have other risk factors. Lab tests are generally done as part of preventative care to screen  for anemia and blood disorders, to screen for problems with the kidneys and liver, to screen for bladder problems, to check blood sugar, and to check your cholesterol level. Preventative services generally include counseling about diet, exercise, avoiding tobacco, drugs, excessive alcohol consumption, and sexually transmitted infections.    GENERAL RECOMMENDATIONS FOR GOOD HEALTH:  Healthy diet: Eat a variety of foods, including fruit, vegetables, animal or vegetable protein, such as meat, fish, chicken, and eggs, or beans, lentils, tofu, and grains, such as rice. Drink plenty of water daily. Decrease saturated fat in the diet, avoid lots of red meat, processed foods, sweets, fast foods, and fried foods.  Exercise: Aerobic exercise helps maintain good heart health. At least 30-40 minutes of moderate-intensity exercise is recommended. For example, a brisk walk that increases your heart rate and breathing. This should be done on most days of the week.  Find a type of exercise or a variety of exercises that you enjoy so that it becomes a part of your daily life.  Examples are running, walking, swimming, water aerobics, and biking.  For motivation and support, explore group exercise such as aerobic class, spin class, Zumba, Yoga,or  martial arts, etc.   Set exercise goals for yourself, such as a certain weight goal, walk or run in a race such as a 5k walk/run.  Speak to your primary care provider about exercise goals.  Disease prevention: If you smoke or chew tobacco, find out from your caregiver how to quit. It can literally save your life, no  matter how long you have been a tobacco user. If you do not use tobacco, never begin.  Maintain a healthy diet and normal weight. Increased weight leads to problems with blood pressure and diabetes.  The Body Mass Index or BMI is a way of measuring how much of your body is fat. Having a BMI above 27 increases the risk of heart disease, diabetes, hypertension,  stroke and other problems related to obesity. Your caregiver can help determine your BMI and based on it develop an exercise and dietary program to help you achieve or maintain this important measurement at a healthful level. High blood pressure causes heart and blood vessel problems.  Persistent high blood pressure should be treated with medicine if weight loss and exercise do not work.  Fat and cholesterol leaves deposits in your arteries that can block them. This causes heart disease and vessel disease elsewhere in your body.  If your cholesterol is found to be high, or if you have heart disease or certain other medical conditions, then you may need to have your cholesterol monitored frequently and be treated with medication.  Ask if you should have a cardiac stress test if your history suggests this. A stress test is a test done on a treadmill that looks for heart disease. This test can find disease prior to there being a problem. Menopause can be associated with physical symptoms and risks. Hormone replacement therapy is available to decrease these. You should talk to your caregiver about whether starting or continuing to take hormones is right for you.  Osteoporosis is a disease in which the bones lose minerals and strength as we age. This can result in serious bone fractures. Risk of osteoporosis can be identified using a bone density scan. Women ages 47 and over should discuss this with their caregivers, as should women after menopause who have other risk factors. Ask your caregiver whether you should be taking a calcium supplement and Vitamin D, to reduce the rate of osteoporosis.  Avoid drinking alcohol in excess (more than two drinks per day).  Avoid use of street drugs. Do not share needles with anyone. Ask for professional help if you need assistance or instructions on stopping the use of alcohol, cigarettes, and/or drugs. Brush your teeth twice a day with fluoride toothpaste, and floss once a  day. Good oral hygiene prevents tooth decay and gum disease. The problems can be painful, unattractive, and can cause other health problems. Visit your dentist for a routine oral and dental check up and preventive care every 6-12 months.  Look at your skin regularly.  Use a mirror to look at your back. Notify your caregivers of changes in moles, especially if there are changes in shapes, colors, a size larger than a pencil eraser, an irregular border, or development of new moles.  Safety: Use seatbelts 100% of the time, whether driving or as a passenger.  Use safety devices such as hearing protection if you work in environments with loud noise or significant background noise.  Use safety glasses when doing any work that could send debris in to the eyes.  Use a helmet if you ride a bike or motorcycle.  Use appropriate safety gear for contact sports.  Talk to your caregiver about gun safety. Use sunscreen with a SPF (or skin protection factor) of 15 or greater.  Lighter skinned people are at a greater risk of skin cancer. Don't forget to also wear sunglasses in order to protect your eyes from too much  damaging sunlight. Damaging sunlight can accelerate cataract formation.  Practice safe sex. Use condoms. Condoms are used for birth control and to help reduce the spread of sexually transmitted infections (or STIs).  Some of the STIs are gonorrhea (the clap), chlamydia, syphilis, trichomonas, herpes, HPV (human papilloma virus) and HIV (human immunodeficiency virus) which causes AIDS. The herpes, HIV and HPV are viral illnesses that have no cure. These can result in disability, cancer and death.  Keep carbon monoxide and smoke detectors in your home functioning at all times. Change the batteries every 6 months or use a model that plugs into the wall.   Vaccinations: Stay up to date with your tetanus shots and other required immunizations. You should have a booster for tetanus every 10 years. Be sure to get your  flu shot every year, since 5%-20% of the [place] population comes down with the flu. The flu vaccine changes each year, so being vaccinated once is not enough. Get your shot in the fall, before the flu season peaks.   Other vaccines to consider: Human Papilloma Virus or HPV causes cancer of the cervix, and other infections that can be transmitted from person to person. There is a vaccine for HPV, and females should get immunized between the ages of 1 and 42. It requires a series of 3 shots.  Pneumococcal vaccine to protect against certain types of pneumonia.  This is normally recommended for adults age 61 or older.  However, adults younger than 56 years old with certain underlying conditions such as diabetes, heart or lung disease should also receive the vaccine. Shingles vaccine to protect against Varicella Zoster if you are older than age 18, or younger than 56 years old with certain underlying illness. Hepatitis A vaccine to protect against a form of infection of the liver by a virus acquired from food. Hepatitis B vaccine to protect against a form of infection of the liver by a virus acquired from blood or body fluids, particularly if you work in health care. If you plan to travel internationally, check with your local health department for specific vaccination recommendations.  Cancer Screening: Breast cancer screening is essential to preventive care for women. All women age 71 and older should perform a breast self-exam every month. At age 39 and older, women should have their caregiver complete a breast exam each year. Women at ages 68 and older should have a mammogram (x-ray film) of the breasts. Your caregiver can discuss how often you need mammograms.   Cervical cancer screening includes taking a Pap smear (sample of cells examined under a microscope) from the cervix (end of the uterus). It also includes testing for HPV (Human Papilloma Virus, which can cause cervical cancer). Screening and a  pelvic exam should begin at age 55, or 3 years after a woman becomes sexually active. Screening should occur every year, with a Pap smear but no HPV testing, up to age 22. After age 50, you should have a Pap smear every 3 years with HPV testing, if no HPV was found previously.  Most routine colon cancer screening begins at the age of 55. On a yearly basis, doctors may provide special easy to use take-home tests to check for hidden blood in the stool. Sigmoidoscopy or colonoscopy can detect the earliest forms of colon cancer and is life saving. These tests use a small camera at the end of a tube to directly examine the colon. Speak to your caregiver about this at age 57, when routine screening  begins (and is repeated every 5 years unless early forms of pre-cancerous polyps or small growths are found).

## 2010-09-02 LAB — CENTROMERE ANTIBODIES: Centromere Ab Screen: 3 AU/mL (ref <30)

## 2010-09-02 LAB — ANTI-SCLERODERMA ANTIBODY: Scleroderma (Scl-70) (ENA) Antibody, IgG: <1 AU/mL (ref <30)

## 2010-09-02 LAB — HEPATITIS B CORE ANTIBODY, IGM: Hep B C IgM: NEGATIVE

## 2010-09-02 LAB — ANA: Anti Nuclear Antibody(ANA): NEGATIVE

## 2010-09-02 NOTE — Op Note (Signed)
NAMELAURANNE, BEYERSDORF         ACCOUNT NO.:  [id number]   MEDICAL RECORD NO.:  [id number]          PATIENT TYPE:  AMB   LOCATION:  DAY                          FACILITY:  [name]   PHYSICIAN:  Ardeth Sportsman, MD     DATE OF BIRTH:  1955-07-21   DATE OF PROCEDURE:  06/18/2008  DATE OF DISCHARGE:                               OPERATIVE REPORT   PRIMARY CARE PHYSICIAN:  Stacie Acres. Cliffton Asters, M.D.   REFERRING PHYSICIAN:  Florentina Addison at [facility] emergency  department.   SURGEON:  Ardeth Sportsman, MD   ASSISTANT:  None.   PREOPERATIVE DIAGNOSES:  1. Biliary dyskinesia.  Probable chronic cholecystitis.  2. History of heavy alcohol use in the past.   POSTOPERATIVE DIAGNOSES:  1. Cholecystolithiasis.  2. Chronic cholecystitis.  3. History of heavy alcohol use in the past.  4. Fatty steatohepatitis, possible early cirrhosis.   PROCEDURE PERFORMED:  1. Laparoscopic cholecystectomy (no cholangiogram).  2. 14 gauge core liver biopsies x3.   ANESTHESIA:  1. General anesthesia.  2. Local anesthetic in a field block at all port sites.   SPECIMENS:  1. Gallbladder.  2. Core liver biopsies x3 (Segment IV of the liver).   DRAINS:  None.   ESTIMATED BLOOD LOSS:  10 mL.   COMPLICATIONS:  None apparent.   INDICATIONS:  Ms. Siegmann is a 55 year old female who has had  intermittent severe episodes of abdominal pain more classic for biliary  colic.  She had evaluation in the past that was negative for stones but  more recently had sludge.  She has claimed to be abstinent on her  alcohol consumption which has been intermittently heavy in the past.  She has been followed for reflux disease on proton pump inhibitors and  recently switched to Kapidex without any significant improvement in  symptoms.  Cardiopulmonary function was otherwise good.   Based on otherwise negative differential diagnosis, the anatomy and  physiology of hepatobiliary and pancreatic function was  discussed.  Pathophysiology of cholecystolithiasis and biliary dyskinesia was  discussed.  Options were discussed and recommendations made for  diagnostic laparoscopy with cholecystectomy.  Risks, benefits,  alternatives discussed.  Questions answered and she agreed to proceed.   OPERATIVE FINDINGS:  She had some mild gallbladder wall thickening but  no major adhesions.  She had a very narrow cystic duct that was very  fragile.  She did have small black wafer gallstones consistent with  cholecystolithiasis.  She did have an obvious fatty liver but little bit  of lobular changes concerning for early cirrhosis although no strong  evidence of portal hypertension or significant varices or other major  sequelae.   DESCRIPTION OF PROCEDURE:  Informed consent was confirmed.  The patient  underwent general anesthesia without any difficulty.  She voided just  prior to coming the operating room.  She had sequential compression  devices active during entire case.  She was positioned supine with arms  tucked on the foot board.  Her abdomen was prepped and draped in sterile  fashion.   A 5 mm port was placed on the right upper quadrant using optical entry  technique with the patient in steep reverse Trendelenburg and right-side  up.  Camera inspection revealed no intra-abdominal injury.  Under direct  visualization 5-mm ports were placed in the right flank and through the  prior infraumbilical tubal ligation incision.  A 10 mm port was tunneled  through the falciform ligament of the subxiphoid region.   Gallbladder fundus was grasped and elevated cephalad.  Peritoneal  coverings between the liver and the gallbladder and the anteromedial and  posterior sidewalls was freed off using hook cautery.  A circumferential  dissection was done to free the proximal third of the gallbladder off  the liver bed.  In skeletonizing, I found three obvious structures going  from the gallbladder down to the porta  hepatis.  One was on the  anteromedial wall consistent with the anterior branch of the cystic  artery.  This was skeletonized confirmed.  I put one clip on the  gallbladder side, 2 clips slightly proximal were made and this was  transected.  I found a similar posterior branch and ligation was secured  on the middle part of the posterior lateral wall.  This was freed off.   Further dissection was done to skeletonize the infundibulum.  In doing  this, the cystic duct tore.  I was able to find the cystic duct stump  and elevate it and carefully skeletonize it.  It was rather short.  I  tried to open up the orifice.  It was rather narrowed.  I did bring a 5-  [place] cholangiocatheter subcostal region flushed it and tried to  carefully place it through the cystic duct stump, but I could not safely  do this.  The cystic duct stump was contracted and rather short.  I was  able to get down to the cystic duct/common bile duct junction.  Because  she did not have any frank jaundice and did not have any obvious ductal  dilatation, I elected to not perform cholangiogram at this time as I was  worried about the risk of trying to force a catheter at this moment.  Therefore I placed a 0 PDS Endoloop on the cystic duct stump and 3 clips  right close to the tip of the stump right on top of each other to right  good cystic duct stump closure.   The gallbladder was freed from its main attachments on the liver bed and  brought out the subxiphoid port.  There was some spillage of stones in  doing this.  Over 3 L of copious irrigation was done in the abdomen with  clear return at the end.  Hemostasis was excellent on the liver bed and  the cystic duct and arterial branch stumps.   Given the abnormal liver appearance and not as concerning gallbladder, I  went ahead and did 14 gauge core liver biopsies on the anterior surface  of segment IV of the liver through the same right subcostal stab  incision in three  good cores.  Hemostasis was excellent.  The  capnoperitoneum was evacuated.  Ports were removed.  The subxiphoid port  had contracted to not allow my pinky to pass and it was tunneled at an  angle so I did not feel it required more aggressive closure.  Skin was  closed using 4-0 Monocryl stitch.  Sterile dressings applied.   The patient was extubated and sent to the recovery room in stable  condition.  I discussed postop care with the patient just prior to  surgery.  Per her wishes, I am going to discuss with her sister.      Ardeth Sportsman, MD  Electronically Signed     SCG/MEDQ  D:  06/18/2008  T:  06/18/2008  Job:  [id number]

## 2010-09-05 ENCOUNTER — Other Ambulatory Visit: Payer: Self-pay | Admitting: Medical

## 2010-09-05 ENCOUNTER — Telehealth: Payer: Self-pay | Admitting: Medical

## 2010-09-05 ENCOUNTER — Encounter: Payer: Self-pay | Admitting: Medical

## 2010-09-05 DIAGNOSIS — I10 Essential (primary) hypertension: Secondary | ICD-10-CM

## 2010-09-05 DIAGNOSIS — B001 Herpesviral vesicular dermatitis: Secondary | ICD-10-CM

## 2010-09-05 MED ORDER — LISINOPRIL 5 MG PO TABS: 5.0000 mg | ORAL_TABLET | Freq: Every day | ORAL | Status: DC

## 2010-09-05 MED ORDER — VALACYCLOVIR HCL 500 MG PO TABS: 500.0000 mg | ORAL_TABLET | Freq: Two times a day (BID) | ORAL | Status: AC

## 2010-09-05 NOTE — Telephone Encounter (Signed)
I called and spoke to Mrs. [name] along with office manager Lafonda Mosses.  Apparently Mrs. Treat has called numerous times about her results, was not very polite to office staff, and made some particularly worrisome remarks to the on call message service a few nights ago.     Today I advised her that her tests came back negative for a rheumatologic cause of her dry mouth such as Scleroderma or Sjogren's, she is negative for Hep B and C, her cholesterol was normal, her sed rate was normal, and looking back through records, she has seen by Dr. Marlowe Shores prior with diagnosis of alcohol abuse and fatty liver disease.  CT scan 10/11 showed pancreatitis and fatty liver disease.    At this point, I will send a script for mouthwash to try for her c/o dry mouth.  Advised her to follow up with recheck to GI regarding liver disease.  If dry mouth doesn't improve, she can see dermatology or other specialty.    I also reminded her of her ortho and colonoscopy referrals we made regarding cyst of finger and colonoscopy first screening.  Advised we would send her a letter regarding her lab results.   Magic Mouthwash called into [facility] pharmacy.

## 2010-09-08 ENCOUNTER — Telehealth: Payer: Self-pay | Admitting: Medical

## 2010-09-19 ENCOUNTER — Inpatient Hospital Stay (HOSPITAL_COMMUNITY)
Admission: EM | Admit: 2010-09-19 | Discharge: 2010-09-23 | DRG: 917 | Disposition: A | Discharge status: Home / Self Care | Payer: 59 | Attending: Emergency Medicine | Admitting: Emergency Medicine

## 2010-09-19 ENCOUNTER — Emergency Department (HOSPITAL_COMMUNITY): Payer: 59

## 2010-09-19 DIAGNOSIS — F3289 Other specified depressive episodes: Secondary | ICD-10-CM | POA: Diagnosis present

## 2010-09-19 DIAGNOSIS — K219 Gastro-esophageal reflux disease without esophagitis: Secondary | ICD-10-CM | POA: Diagnosis present

## 2010-09-19 DIAGNOSIS — J9600 Acute respiratory failure, unspecified whether with hypoxia or hypercapnia: Secondary | ICD-10-CM | POA: Diagnosis present

## 2010-09-19 DIAGNOSIS — F918 Other conduct disorders: Secondary | ICD-10-CM | POA: Diagnosis present

## 2010-09-19 DIAGNOSIS — F329 Major depressive disorder, single episode, unspecified: Secondary | ICD-10-CM | POA: Diagnosis present

## 2010-09-19 DIAGNOSIS — I959 Hypotension, unspecified: Secondary | ICD-10-CM | POA: Diagnosis present

## 2010-09-19 DIAGNOSIS — F1010 Alcohol abuse, uncomplicated: Secondary | ICD-10-CM | POA: Diagnosis present

## 2010-09-19 DIAGNOSIS — Z87898 Personal history of other specified conditions: Secondary | ICD-10-CM

## 2010-09-19 DIAGNOSIS — T65891A Toxic effect of other specified substances, accidental (unintentional), initial encounter: Secondary | ICD-10-CM | POA: Diagnosis present

## 2010-09-19 DIAGNOSIS — E876 Hypokalemia: Secondary | ICD-10-CM | POA: Diagnosis present

## 2010-09-19 DIAGNOSIS — R079 Chest pain, unspecified: Secondary | ICD-10-CM | POA: Diagnosis present

## 2010-09-19 DIAGNOSIS — J690 Pneumonitis due to inhalation of food and vomit: Secondary | ICD-10-CM | POA: Diagnosis present

## 2010-09-19 DIAGNOSIS — T510X4A Toxic effect of ethanol, undetermined, initial encounter: Principal | ICD-10-CM | POA: Diagnosis present

## 2010-09-19 LAB — COMPREHENSIVE METABOLIC PANEL
ALT: 56 U/L — ABNORMAL HIGH (ref 0–35)
AST: 76 U/L — ABNORMAL HIGH (ref 0–37)
CO2: 22 mEq/L (ref 19–32)
Calcium: 8.3 mg/dL — ABNORMAL LOW (ref 8.4–10.5)
Chloride: 102 mEq/L (ref 96–112)
GFR calc Af Amer: >60 mL/min (ref >60)
GFR calc non Af Amer: >60 mL/min (ref >60)
Sodium: 138 mEq/L (ref 135–145)

## 2010-09-19 LAB — CBC
Hemoglobin: 15.6 g/dL — ABNORMAL HIGH (ref 12.0–15.0)
MCH: 33.8 pg (ref 26.0–34.0)
Platelets: 222 10*3/uL (ref 150–400)
RBC: 4.61 MIL/uL (ref 3.87–5.11)
WBC: 11.7 10*3/uL — ABNORMAL HIGH (ref 4.0–10.5)

## 2010-09-19 LAB — URINALYSIS, ROUTINE W REFLEX MICROSCOPIC
Bilirubin Urine: NEGATIVE
Glucose, UA: NEGATIVE mg/dL
Hgb urine dipstick: NEGATIVE
Nitrite: NEGATIVE
Specific Gravity, Urine: 1.007 (ref 1.005–1.030)
pH: 6 (ref 5.0–8.0)

## 2010-09-19 LAB — DIFFERENTIAL
Basophils Absolute: 0.1 10*3/uL (ref 0.0–0.1)
Basophils Relative: 1 % (ref 0–1)
Eosinophils Absolute: 0.1 10*3/uL (ref 0.0–0.7)
Neutro Abs: 6.6 10*3/uL (ref 1.7–7.7)
Neutrophils Relative %: 57 % (ref 43–77)

## 2010-09-19 LAB — GLUCOSE, CAPILLARY: Glucose-Capillary: 96 mg/dL (ref 70–99)

## 2010-09-19 LAB — PREGNANCY, URINE: Preg Test, Ur: NEGATIVE

## 2010-09-19 LAB — RAPID URINE DRUG SCREEN, HOSP PERFORMED
Amphetamines: NOT DETECTED
Cocaine: NOT DETECTED
Opiates: NOT DETECTED
Tetrahydrocannabinol: NOT DETECTED

## 2010-09-20 ENCOUNTER — Emergency Department (HOSPITAL_COMMUNITY): Payer: 59

## 2010-09-20 DIAGNOSIS — J9600 Acute respiratory failure, unspecified whether with hypoxia or hypercapnia: Secondary | ICD-10-CM

## 2010-09-20 DIAGNOSIS — I959 Hypotension, unspecified: Secondary | ICD-10-CM

## 2010-09-20 DIAGNOSIS — T65891A Toxic effect of other specified substances, accidental (unintentional), initial encounter: Secondary | ICD-10-CM

## 2010-09-20 DIAGNOSIS — T510X4A Toxic effect of ethanol, undetermined, initial encounter: Secondary | ICD-10-CM

## 2010-09-20 LAB — BLOOD GAS, ARTERIAL
Bicarbonate: 17.8 mEq/L — ABNORMAL LOW (ref 20.0–24.0)
Drawn by: 235321
MECHVT: 0.5 mL
MECHVT: 500 mL
PEEP: 5 cmH2O
PEEP: 5 cmH2O
Patient temperature: 98.6
RATE: 15 resp/min
TCO2: 16.4 mmol/L (ref 0–100)
TCO2: 19.4 mmol/L (ref 0–100)
pCO2 arterial: 37.3 mmHg (ref 35.0–45.0)
pH, Arterial: 7.338 — ABNORMAL LOW (ref 7.350–7.400)
pH, Arterial: 7.299 — ABNORMAL LOW (ref 7.350–7.400)
pH, Arterial: 7.367 (ref 7.350–7.400)

## 2010-09-20 LAB — URINALYSIS, MICROSCOPIC ONLY
Bilirubin Urine: NEGATIVE
Glucose, UA: NEGATIVE mg/dL
Ketones, ur: NEGATIVE mg/dL
Protein, ur: NEGATIVE mg/dL

## 2010-09-20 LAB — LACTIC ACID, PLASMA
Lactic Acid, Venous: 2 mmol/L (ref 0.5–2.2)
Lactic Acid, Venous: 1.4 mmol/L (ref 0.5–2.2)

## 2010-09-20 LAB — CBC
Hemoglobin: 12.2 g/dL (ref 12.0–15.0)
MCHC: 32.8 g/dL (ref 30.0–36.0)
Platelets: 182 10*3/uL (ref 150–400)
RBC: 3.66 MIL/uL — ABNORMAL LOW (ref 3.87–5.11)

## 2010-09-20 LAB — GLUCOSE, CAPILLARY
Glucose-Capillary: 114 mg/dL — ABNORMAL HIGH (ref 70–99)
Glucose-Capillary: 124 mg/dL — ABNORMAL HIGH (ref 70–99)

## 2010-09-20 LAB — BASIC METABOLIC PANEL
BUN: 7 mg/dL (ref 6–23)
CO2: 22 mEq/L (ref 19–32)
GFR calc non Af Amer: >60 mL/min (ref >60)
Glucose, Bld: 72 mg/dL (ref 70–99)
Potassium: 3.2 mEq/L — ABNORMAL LOW (ref 3.5–5.1)

## 2010-09-20 LAB — PROTIME-INR
INR: 0.97 (ref 0.00–1.49)
Prothrombin Time: 13.1 seconds (ref 11.6–15.2)

## 2010-09-20 LAB — CARDIAC PANEL(CRET KIN+CKTOT+MB+TROPI)
CK, MB: 1.9 ng/mL (ref 0.3–4.0)
Relative Index: INVALID (ref 0.0–2.5)
Relative Index: INVALID (ref 0.0–2.5)
Total CK: 69 U/L (ref 7–177)
Troponin I: <0.3 ng/mL (ref <0.30)

## 2010-09-20 LAB — TYPE AND SCREEN: Antibody Screen: NEGATIVE

## 2010-09-20 LAB — MRSA PCR SCREENING: MRSA by PCR: NEGATIVE

## 2010-09-20 LAB — AMYLASE: Amylase: 21 U/L (ref 0–105)

## 2010-09-20 LAB — ABO/RH: ABO/RH(D): A POS

## 2010-09-21 ENCOUNTER — Inpatient Hospital Stay (HOSPITAL_COMMUNITY): Payer: 59

## 2010-09-21 DIAGNOSIS — T510X4A Toxic effect of ethanol, undetermined, initial encounter: Secondary | ICD-10-CM

## 2010-09-21 DIAGNOSIS — J9600 Acute respiratory failure, unspecified whether with hypoxia or hypercapnia: Secondary | ICD-10-CM

## 2010-09-21 DIAGNOSIS — I959 Hypotension, unspecified: Secondary | ICD-10-CM

## 2010-09-21 DIAGNOSIS — F1010 Alcohol abuse, uncomplicated: Secondary | ICD-10-CM

## 2010-09-21 DIAGNOSIS — T65891A Toxic effect of other specified substances, accidental (unintentional), initial encounter: Secondary | ICD-10-CM

## 2010-09-21 LAB — COMPREHENSIVE METABOLIC PANEL
ALT: 42 U/L — ABNORMAL HIGH (ref 0–35)
AST: 42 U/L — ABNORMAL HIGH (ref 0–37)
Calcium: 7.8 mg/dL — ABNORMAL LOW (ref 8.4–10.5)
Creatinine, Ser: 0.62 mg/dL (ref 0.4–1.2)
GFR calc Af Amer: >60 mL/min (ref >60)
GFR calc non Af Amer: >60 mL/min (ref >60)
Sodium: 139 mEq/L (ref 135–145)
Total Protein: 6 g/dL (ref 6.0–8.3)

## 2010-09-21 LAB — GLUCOSE, CAPILLARY
Glucose-Capillary: 131 mg/dL — ABNORMAL HIGH (ref 70–99)
Glucose-Capillary: 140 mg/dL — ABNORMAL HIGH (ref 70–99)
Glucose-Capillary: 132 mg/dL — ABNORMAL HIGH (ref 70–99)

## 2010-09-21 LAB — URINE CULTURE
Culture  Setup Time: 201206021916
Special Requests: NEGATIVE

## 2010-09-21 LAB — CBC
MCH: 33.2 pg (ref 26.0–34.0)
MCHC: 32.9 g/dL (ref 30.0–36.0)
RDW: 11.7 % (ref 11.5–15.5)

## 2010-09-22 ENCOUNTER — Inpatient Hospital Stay (HOSPITAL_COMMUNITY): Payer: 59

## 2010-09-22 DIAGNOSIS — M79609 Pain in unspecified limb: Secondary | ICD-10-CM

## 2010-09-22 LAB — CULTURE, RESPIRATORY W GRAM STAIN

## 2010-09-22 LAB — BASIC METABOLIC PANEL
BUN: 8 mg/dL (ref 6–23)
CO2: 25 mEq/L (ref 19–32)
Chloride: 105 mEq/L (ref 96–112)
Creatinine, Ser: 0.49 mg/dL (ref 0.4–1.2)
Glucose, Bld: 99 mg/dL (ref 70–99)
Potassium: 3 mEq/L — ABNORMAL LOW (ref 3.5–5.1)

## 2010-09-23 DIAGNOSIS — T43694A Poisoning by other psychostimulants, undetermined, initial encounter: Secondary | ICD-10-CM

## 2010-09-23 DIAGNOSIS — F4320 Adjustment disorder, unspecified: Secondary | ICD-10-CM

## 2010-09-23 DIAGNOSIS — J9600 Acute respiratory failure, unspecified whether with hypoxia or hypercapnia: Secondary | ICD-10-CM

## 2010-09-23 DIAGNOSIS — F1910 Other psychoactive substance abuse, uncomplicated: Secondary | ICD-10-CM

## 2010-09-23 LAB — BASIC METABOLIC PANEL
BUN: 6 mg/dL (ref 6–23)
CO2: 28 mEq/L (ref 19–32)
Chloride: 98 mEq/L (ref 96–112)
Glucose, Bld: 86 mg/dL (ref 70–99)
Potassium: 3.4 mEq/L — ABNORMAL LOW (ref 3.5–5.1)

## 2010-09-24 ENCOUNTER — Emergency Department (HOSPITAL_COMMUNITY)
Admission: EM | Admit: 2010-09-24 | Discharge: 2010-09-25 | Disposition: A | Discharge status: Other Acute Inpatient Hospital | Payer: 59 | Attending: Emergency Medicine | Admitting: Emergency Medicine

## 2010-09-24 DIAGNOSIS — R45851 Suicidal ideations: Secondary | ICD-10-CM | POA: Insufficient documentation

## 2010-09-24 DIAGNOSIS — F29 Unspecified psychosis not due to a substance or known physiological condition: Secondary | ICD-10-CM | POA: Insufficient documentation

## 2010-09-24 DIAGNOSIS — F1010 Alcohol abuse, uncomplicated: Secondary | ICD-10-CM | POA: Insufficient documentation

## 2010-09-24 DIAGNOSIS — K219 Gastro-esophageal reflux disease without esophagitis: Secondary | ICD-10-CM | POA: Insufficient documentation

## 2010-09-24 DIAGNOSIS — F329 Major depressive disorder, single episode, unspecified: Secondary | ICD-10-CM | POA: Insufficient documentation

## 2010-09-24 DIAGNOSIS — Z79899 Other long term (current) drug therapy: Secondary | ICD-10-CM | POA: Insufficient documentation

## 2010-09-24 DIAGNOSIS — F3289 Other specified depressive episodes: Secondary | ICD-10-CM | POA: Insufficient documentation

## 2010-09-24 LAB — COMPREHENSIVE METABOLIC PANEL
AST: 39 U/L — ABNORMAL HIGH (ref 0–37)
Alkaline Phosphatase: 124 U/L — ABNORMAL HIGH (ref 39–117)
BUN: 4 mg/dL — ABNORMAL LOW (ref 6–23)
CO2: 19 mEq/L (ref 19–32)
Chloride: 102 mEq/L (ref 96–112)
Creatinine, Ser: 0.63 mg/dL (ref 0.4–1.2)
GFR calc Af Amer: >60 mL/min (ref >60)
GFR calc non Af Amer: >60 mL/min (ref >60)
Potassium: 3.4 mEq/L — ABNORMAL LOW (ref 3.5–5.1)
Total Bilirubin: 0.2 mg/dL — ABNORMAL LOW (ref 0.3–1.2)

## 2010-09-24 LAB — CBC
HCT: 45.4 % (ref 36.0–46.0)
Hemoglobin: 15.6 g/dL — ABNORMAL HIGH (ref 12.0–15.0)
MCH: 33.6 pg (ref 26.0–34.0)
MCHC: 34.4 g/dL (ref 30.0–36.0)
RBC: 4.64 MIL/uL (ref 3.87–5.11)

## 2010-09-24 LAB — RAPID URINE DRUG SCREEN, HOSP PERFORMED
Amphetamines: NOT DETECTED
Barbiturates: NOT DETECTED
Benzodiazepines: NOT DETECTED

## 2010-09-24 LAB — DIFFERENTIAL
Lymphocytes Relative: 42 % (ref 12–46)
Monocytes Absolute: 0.9 10*3/uL (ref 0.1–1.0)
Monocytes Relative: 8 % (ref 3–12)
Neutro Abs: 6 10*3/uL (ref 1.7–7.7)

## 2010-09-25 ENCOUNTER — Inpatient Hospital Stay (HOSPITAL_COMMUNITY)
Admission: AD | Admit: 2010-09-25 | Discharge: 2010-09-29 | DRG: 897 | Disposition: A | Discharge status: Home / Self Care | Payer: 59 | Attending: Psychiatry | Admitting: Psychiatry

## 2010-09-25 DIAGNOSIS — F1994 Other psychoactive substance use, unspecified with psychoactive substance-induced mood disorder: Secondary | ICD-10-CM

## 2010-09-25 DIAGNOSIS — F339 Major depressive disorder, recurrent, unspecified: Secondary | ICD-10-CM

## 2010-09-25 DIAGNOSIS — F1020 Alcohol dependence, uncomplicated: Secondary | ICD-10-CM

## 2010-09-25 DIAGNOSIS — I10 Essential (primary) hypertension: Secondary | ICD-10-CM

## 2010-09-25 DIAGNOSIS — F329 Major depressive disorder, single episode, unspecified: Secondary | ICD-10-CM

## 2010-09-26 LAB — CULTURE, BLOOD (ROUTINE X 2)
Culture  Setup Time: 201206021328
Culture: NO GROWTH
Culture: NO GROWTH

## 2010-09-26 NOTE — H&P (Signed)
[name], [name]            ACCOUNT NO.:  [id number]  MEDICAL RECORD NO.:  [id number]  LOCATION:  2952                          FACILITY:  [facility]  PHYSICIAN:  [name] Gallo, MD     DATE OF BIRTH:  11-26-1955  DATE OF ADMISSION:  09/25/2010 DATE OF DISCHARGE:                      PSYCHIATRIC ADMISSION ASSESSMENT   IDENTIFYING INFORMATION:  55 year old female, single.  This is a voluntary admission.  HISTORY OF PRESENT ILLNESS:  First [facility] admission for [name] who presents requesting detox from alcohol and help getting stabilized.  She was initially admitted to our medical unit last Friday after unintentionally overdosing on pain medication while under the influence of alcohol.  She was discharged on Tuesday with a follow-up plan, but immediately resumed drinking alcohol and called her psychiatrist and was transferred by ambulance to the emergency room.  She reports that she has been going through about 2 half gallons of vodka every week.  Her use has escalated over the course of the past year.  She denies any other substance abuse. She endorses significant depression coinciding with fairly heavy alcohol use for at least a year.  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  Following that she had a series of financial setbacks, caregiver stress with care of her elderly mother and family issues.  She is asking for help regaining sobriety.  PAST PSYCHIATRIC HISTORY:  Currently followed by a counselor [name] at [facility] [address] in [place].  She has a history of alcohol abuse and previously was very active in Alcoholics Anonymous and with 14 years of sobriety.  She denies a history of intentional suicide attempts.  Please see the recent discharge summary from our medical unit where she was discharged on Tuesday, September 22, 2009.  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  Lost her job in 2008.  Primary caregiver for her elderly mother who is now placed in a good assisted-living facility. The patient has a female friend who is supportive and her primary support person.  She has a son named [name] who she has reconciled with after being estranged for an extended period of time.  No current legal problems.  She has significant medical stressors.  FAMILY HISTORY:  Family history not available.  PAST MEDICAL HISTORY:  Primary care physician and is [name], M.D.  Medical problems are hypertension, history of pancreatitis, history of chronic herpes infection.  Past medical history significant for hospital admission for pancreatitis in September 2011 and fractured left shoulder in November 2011 after a fall while inebriated.  CURRENT MEDICATIONS: 1. Gabapentin 100 mg t.i.d. 2. Phenergan 12.5 mg as needed for nausea. 3. Protonix 40 mg b.i.d. 4. Tramadol 50 mg for pain. 5. She also previously had been prescribed diazepam 10 mg three times     a day as needed for muscle spasms.  ALLERGIES:  Drug allergies are none.  PHYSICAL EXAMINATION:  Physical exam was done in the emergency room and is noted in the record.  This is a medium built Caucasian female  who appears about 10 years older than her stated age, tearful today but in full contact with reality. VITAL SIGNS: Admitting vital signs temperature 98.1, pulse 92, respirations 20, blood pressure 143/87. GENERAL: Well-nourished, well-developed female in no acute distress.  CBC WBC 12.5, hemoglobin 15.6, hematocrit 45.4, platelets 312,000. Alcohol level 237 on presentation.  Chemistry normal.  BUN 4, creatinine 0.63.  Liver enzymes had revealed a mild elevation in transaminases with an SGOT of 39.  Other results are unavailable to due to data access problems at this  time.  Urine drug screen negative for all substances.  MENTAL STATUS EXAM:  A fully alert female, pleasant, cooperative. Thinking is sequential.  Speech nonpressured, cooperative, sad appearing, looks depressed, feels depressed and is attributing it right now to multiple losses in the past year and significant alcohol use. Thinking logical, goal directed.  No signs of confusion or delirium.  No psychosis.  Denying suicidal thoughts.  Cognitively she is intact.  Fund of knowledge is satisfactory.  Insight adequate.  Impulse control and judgment normal.  Axis I:  Rule out substance-induced mood disorder.  Alcohol dependence. Axis  II:  Deferred. Axis III:  History of pancreatitis.  Elevated blood pressure, rule out hypertension. Axis IV.  Severe issues with multiple losses and having stable home and supportive friends as an asset. Axis V:  Current 40.  Past year not known.  PLAN:  Plan is to voluntarily admit her with a goal of stabilizing her mood and giving her a safe detox in 4-5 days.  We are going to continue Protonix 40 mg twice a day.  She also takes a Valtrex prophylaxis for chronic herpes which we will continue and will continue lisinopril 5 mg daily.     [name], N.P.   ______________________________ [name] Gallo, MD    MAS/MEDQ  D:  09/25/2010  T:  09/25/2010  Job:  [id number]  Electronically Signed by [name] N.P. on 09/26/2010 05:02:22 PM Electronically Signed by [name] Gallo MD on 09/26/2010 06:15:13 PM

## 2010-09-27 LAB — RAPID STREP SCREEN (MED CTR MEBANE ONLY): Streptococcus, Group A Screen (Direct): NEGATIVE

## 2010-09-27 NOTE — Consult Note (Signed)
[name], [name]            ACCOUNT NO.:  [id number]  MEDICAL RECORD NO.:  [id number]  LOCATION:  1513                         FACILITY:  [facility]  PHYSICIAN:  Eulogio Ditch, MD DATE OF BIRTH:  1955-09-12  DATE OF CONSULTATION: DATE OF DISCHARGE:                                CONSULTATION   REASON FOR CONSULTATION:  Drug overdose.  HISTORY OF PRESENT ILLNESS:  The patient is a 55 year old female with a history of alcohol abuse who was admitted first in the ICU as the patient was intubated.  The patient reported that she took some pills to get high so that she can sleep but she denied that it was a suicide attempt.  The patient also reported she was drunk at that time and was not thinking right.  The patient denies any past psych suicide attempt or admission to psych hospital.  The patient is not following any psychiatrist or psychologist in the outpatient setting.  The patient reported she works in the [employer].  She lives alone.  She has one son but she has disowned him but the patient gave consent to speak with a friend whose name is Brett Canales.  The social worker is going to call this friend.  He will also about the patient at work to see how the patient is doing for the last few months.  The patient denied that she has any alcohol abuse issues.  She denied any social stress.  She reported that she has financial stress but everybody has those kind of stress, so she is not only one as per her. She denied abusing any drugs like cocaine or opioids or heroin.  The patient was irritable during the interview, was not forthcoming in providing all the information.  The patient reported that she do not know what medication she took and how many she took but she was not trying to kill herself.  PAST MEDICAL HISTORY:  History of pancreatitis secondary to alcohol abuse, history of gastroesophageal reflux disease.  PAST PSYCH HISTORY:  History of depression as per medical  records but the patient denied any suicide attempt or any admission to Psychiatry. She denied taking any medications for the mood, not followed by a psychologist or psychiatrist in the outpatient setting.  ALLERGIES:  No known drug allergies.  SUBSTANCE ABUSE HISTORY:  The patient denies abusing any illicit drugs but abusing alcohol.  She denies that she has any abuse issues.  As per her, she drinks as a normal person.  MENTAL STATUS EXAMINATION:  The patient was fairly cooperative during the interview.  Fair eye contact.  No abnormal movements noticed. Speech normal in rate, rhythm and volume.  Mood irritable.  When asking about it, the patient reported that she is in the hospital and she is stressed out and she might lose her job.  Affect mood congruent. Thought process logical and goal directed, not suicidal or homicidal, not internally preoccupied.  Denies hearing any voices.  Is not delusional.  Alert, awake, oriented x3.  Memory, immediate, recent remote fair.  A  Attention and concentration fair.  Abstraction ability fair.  Insight and judgment fair.  DIAGNOSIS:  AXIS I:  Chronic alcohol abuse, depressive disorder,  not otherwise specified. AXIS II:  Deferred. AXIS III:  See medical notes. AXIS IV:  Unspecified. AXIS V:  50.  RECOMMENDATIONS: 1. At this time, we will call the patient's friend Brett Canales and also his     son and we will also call at the job to get more collateral     information on this patient. 2. At this time, sitter can be continued. 3. I will follow up on this patient.  Thanks for involving me in taking care of this patient.     Eulogio Ditch, MD     SA/MEDQ  D:  09/22/2010  T:  09/22/2010  Job:  [id number]  Electronically Signed by Eulogio Ditch  on 09/27/2010 05:58:19 PM

## 2010-10-01 ENCOUNTER — Emergency Department (HOSPITAL_COMMUNITY)
Admission: EM | Admit: 2010-10-01 | Discharge: 2010-10-02 | Disposition: A | Discharge status: Home / Self Care | Payer: 59 | Attending: Emergency Medicine | Admitting: Emergency Medicine

## 2010-10-01 DIAGNOSIS — F1010 Alcohol abuse, uncomplicated: Secondary | ICD-10-CM | POA: Insufficient documentation

## 2010-10-01 DIAGNOSIS — F4320 Adjustment disorder, unspecified: Secondary | ICD-10-CM

## 2010-10-01 DIAGNOSIS — F3289 Other specified depressive episodes: Secondary | ICD-10-CM | POA: Insufficient documentation

## 2010-10-01 DIAGNOSIS — F329 Major depressive disorder, single episode, unspecified: Secondary | ICD-10-CM | POA: Insufficient documentation

## 2010-10-01 DIAGNOSIS — R45851 Suicidal ideations: Secondary | ICD-10-CM | POA: Insufficient documentation

## 2010-10-01 LAB — RAPID URINE DRUG SCREEN, HOSP PERFORMED
Cocaine: NOT DETECTED
Opiates: NOT DETECTED
Tetrahydrocannabinol: NOT DETECTED

## 2010-10-02 DIAGNOSIS — F4320 Adjustment disorder, unspecified: Secondary | ICD-10-CM

## 2010-10-02 NOTE — Discharge Summary (Signed)
  NAMEMONSERRATE, BLASCHKE            ACCOUNT NO.:  [id number]  MEDICAL RECORD NO.:  [id number]  LOCATION:  1610                          FACILITY:  [facility]  PHYSICIAN:  Franchot Gallo, MD     DATE OF BIRTH:  1955-10-15  DATE OF ADMISSION:  09/25/2010 DATE OF DISCHARGE:  09/29/2010                              DISCHARGE SUMMARY   REASON FOR ADMISSION:  This is a 55 year old female admitted wanting detox from alcohol.  She was initially admitted to the medical unit after unintentionally overdosing on pain medicine under the influence of alcohol.  She was discharged and then immediately resumed drinking and called her psychiatrist who had transferred her back to the emergency room.  FINAL IMPRESSION:  Rule out substance-induced mood disorder, alcohol dependence. Axis II: Deferred. Axis III: History of pancreatitis, elevated blood pressure, rule out hypertension. Axis IV: Severe issues with multiple losses, having a stable home and support of friends as an asset. Axis V: 55.  PERTINENT LABORATORY DATA:  Alcohol level was 237 on presentation.  BUN of 4.  Liver enzymes were mildly elevated with an SGOT of 39.  Urine drug screen was negative.  SIGNIFICANT FINDINGS:  The patient was admitted to the adult milieu on the substance abuse program.  We had the Librium protocol in place, continued her Protonix and initiated lisinopril for her elevated blood pressure.  The patient began to improve.  Her sleep was good.  Appetite good.  Having mild depressive symptoms rating it a 2 on a scale of 1 to 10.  Denied any suicidal or homicidal thoughts or auditory hallucinations or delusional thinking.  She did have some complaints of a strep throat.  We did a rapid strep.  The patient was participating in groups and felt that she was doing well.  Denied any cravings.  She was having some remorse over her relapse.  Her sleep and appetite were good. She continued to get better, had some broken sleep due  to roommate snoring and was having no significant withdrawal symptoms.  On day of discharge the patient felt she was ready to be discharged.  Her sleep was good.  Appetite good.  Her depressive symptoms had resolved, rating it a 0 on a scale of 1 to 10.  Adamantly denied any suicidal or homicidal thoughts or delusional thinking.  Anxiety was good and she had no alcohol withdrawal.  Her discharge medications Vistaril 25 mg taking one as needed, Zoloft 15 mg one daily, lisinopril 5 mg daily, multivitamin daily, Protonix 40 mg b.i.d., Valtrex 500 mg daily.  The patient was to stop taking her Flexeril, Lomotil, Neurontin, promethazine, tramadol.  Her follow-up appointment was the patient was to go to the IOP program on June 13th at 8:00 a.m., phone number [phone] and she was to call [facility] at phone number [phone].     Landry Corporal, N.P.   ______________________________ Franchot Gallo, MD    JO/MEDQ  D:  10/01/2010  T:  10/01/2010  Job:  [id number]  Electronically Signed by Limmie PatriciaP. on 10/02/2010 09:09:24 AM Electronically Signed by Franchot Gallo MD on 10/02/2010 04:52:16 PM

## 2010-10-09 ENCOUNTER — Ambulatory Visit (HOSPITAL_BASED_OUTPATIENT_CLINIC_OR_DEPARTMENT_OTHER): Payer: 59 | Admitting: Psychology

## 2010-10-09 DIAGNOSIS — F1020 Alcohol dependence, uncomplicated: Secondary | ICD-10-CM

## 2010-10-16 ENCOUNTER — Encounter (HOSPITAL_BASED_OUTPATIENT_CLINIC_OR_DEPARTMENT_OTHER): Payer: 59 | Admitting: Psychology

## 2010-10-16 DIAGNOSIS — F1020 Alcohol dependence, uncomplicated: Secondary | ICD-10-CM

## 2010-10-23 ENCOUNTER — Encounter (HOSPITAL_BASED_OUTPATIENT_CLINIC_OR_DEPARTMENT_OTHER): Payer: 59 | Admitting: Psychology

## 2010-10-23 DIAGNOSIS — F1020 Alcohol dependence, uncomplicated: Secondary | ICD-10-CM

## 2010-10-23 NOTE — Discharge Summary (Signed)
[name], [name]            ACCOUNT NO.:  [id number]  MEDICAL RECORD NO.:  [id number]  LOCATION:  1513                         FACILITY:  [name]  PHYSICIAN:  Charlaine Dalton. Sherene Sires, MD, FCCPDATE OF BIRTH:  [birth year]  DATE OF ADMISSION:  09/19/2010 DATE OF DISCHARGE:  09/23/2010                              DISCHARGE SUMMARY   DISCHARGE DIAGNOSES: 1. Status post vent-dependent respiratory failure secondary to EtOH. 2. Localized pain. 3. Hypokalemia. 4. Suspected aspiration.  HISTORY OF PRESENT ILLNESS:  [name] is a 55 year old white female who was admitted to [facility] Emergency Department on September 19, 2010.  She has a history of alcohol abuse and was admitted after being intubated in the emergency department.  The patient was reported to have taken some kind of pills and also that she could not sleep and denied that it was a suicide attempt.  Pulmonary Critical Care was asked to follow her at that time.  PAST MEDICAL HISTORY: 1. Pancreatitis secondary to alcohol abuse. 2. History of gastroesophageal reflux disease.  PAST PSYCHIATRIC HISTORY:  History of depression per medical records, but denies suicide attempts.  ALLERGIES:  She has no known allergies.  LABORATORY DATA:  Hemoglobin 12.4, hematocrit 37.7, platelets were 18.4, WBCs 11.6.  Sodium 137, potassium 3.4, was treated prior to discharge, chloride 98, CO2 of 28, BUN of 6, creatinine 0.56, glucose 86, albumin is 2.5, AST is 42, ALT is 42, alkaline phosphatase 94, total bilirubin 0.7.  RADIOGRAPHIC DATA:  Chest x-ray demonstrates improved aeration at lung bases bilaterally.  HOSPITAL COURSE BY DISCHARGE DIAGNOSES: 1. Vent-dependent respiratory failure secondary to alcohol abuse.  Of     note, her alcohol level at the time of admission was 282.  She     required mechanical ventilatory support for approximately 24 hours.     She was successfully extubated, liberated from mechanical     ventilatory  support and transferred to the floor. 2. Self-destructive behavior.  She was evaluated by Dr. Rogers Blocker and     found to be of no danger to herself or others.  She has been     followed by [facility] in the past for followup with     them.  She also follow with primary care physician, Dr. Thayer Headings. 3. Localized pain, was felt to be rib pain secondary to falls.  She     had negative chest x-ray and pain resolved. 4. Hypokalemia.  This was repleted. 5. Suspected aspiration pneumonia, although no chest x-ray did support     it neither did her further findings.  Therefore, she was treated     initially with Augmentin, but this was discontinued prior to     discharge.  DISCHARGE MEDICATIONS: 1. Folic acid 1 mg daily. 2. Ibuprofen 400 mg by mouth every 8 hours as needed. 3. Thiamine 100 mg by mouth daily. 4. Cyclobenzaprine 10 mg 3 times a day as needed. 5. Lorazepam 1 mg 1 tablet daily as needed.  DIET:  Her diet is a heart-healthy diet.  She has been instructed to follow up with Dr. Thayer Headings as anoutpatient, [facility] as an outpatient.  There is no need  for her to follow up with Pulmonary Critical Care Team at this time, she was given a card and phone number if should she have any have any type of difficulty to follow up with a pulmonary critical care team.  She was discharged in improved condition.  Note, this was an extended discharge summary, taking greater than 60 minutes.     Devra Dopp, MSN, ACNP   ______________________________ Charlaine Dalton. Sherene Sires, MD, FCCP    SM/MEDQ  D:  09/23/2010  T:  09/24/2010  Job:  [id number]  cc:   Eulogio Ditch, MD  Thayer Headings, M.D. Fax: [phone]  Electronically Signed by Devra Dopp MSN ACNP on 10/07/2010 06:54:50 PM Electronically Signed by Sandrea Hughs MD FCCP on 10/23/2010 11:20:39 AM

## 2010-10-28 NOTE — Consult Note (Signed)
[name], [name]            ACCOUNT NO.:  [id number]  MEDICAL RECORD NO.:  [id number]           PATIENT TYPE:  I  LOCATION:  1226                         FACILITY:  [facility]  PHYSICIAN:  Orbie Hurst, MD         DATE OF BIRTH:  08-24-1955  DATE OF CONSULTATION: DATE OF DISCHARGE:                                CONSULTATION   REQUESTING PHYSICIAN:  Dr. Cloria Spring from the ER.  REASON FOR CONSULTATION AT ADMISSION:  Drug overdose, acute respiratory failure.  HISTORY OF PRESENT ILLNESS:  The patient is a 55 year old female with a past medical history of alcohol abuse, history of pancreatitis, history of depression, gastroesophageal reflux disease who was brought to [name] via EMS due to altered mental status and drug overdose.  As per the medical record, the patient was drinking at home today and she went to a neighbor's house to ask for interactions between 2 medications.  The neighbor called the pharmacist and the pharmacist called EMS due to the patient was drunk and asking for interactions between medications.  Upon EMS arrival, the patient was less responsive and [facility] and she needed to be intubated and started on mechanical ventilation due to acute respiratory failure.  At [name], initial workup showed CBC with elevated white count of 11.7 and negative acetaminophen level and very elevated alcohol level of 22, negative salicylate level and urine tox screen was negative for amphetamines, barbiturates, benzodiazepines, cocaine, opiates, tetrahydrocannabinol.  Initial ABG showed pH 7.33, pCO2 36, pO2 349, bicarb 19.9, and creatinine was 0.51, and elevated liver enzymes; AST 76, ALT 56, alkaline phosphatase 105 with albumin of 3.2.  Initial chest x-ray showed bibasilar infiltrates and the patient had a mild temperature of 100.7.  The patient has history of episodes of acute pancreatitis likely secondary to alcohol abuse with her  last admission in October 2011 and requiring TPN for bowel rest.  Pulmonary and Critical Care consultation and admission was requested due to drug overdose and acute respiratory failure.  The patient has been taking at home diazepam, Flexeril, Lomotil, Neurontin, Phenergan, Protonix, and tramadol/acetaminophen.  REVIEW OF SYSTEMS:  Other systems were negative except as above in HPI.  PAST MEDICAL HISTORY: 1. History of alcohol abuse. 2. History of episodes of acute pancreatitis likely secondary to     alcohol abuse. 3. History of depression. 4. History of gastroesophageal reflux disease.  PAST SURGICAL HISTORY:  Status post cholecystectomy, status post tubal ligation, status post left displaced distal clavicle fracture, status post open reduction with percutaneous pin fixation of the left distal clavicle fracture.  ALLERGIES:  No known drug allergies.  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  She has history of alcohol abuse and she continues drinking.  There is no history of smoking or illicit drug use.  She has history of being unemployed and she has been drinking more since she lost her job.  FAMILY HISTORY:  Noncontributory.  PHYSICAL EXAMINATION:  GENERAL:  The patient is intubated, sedated, on mechanical ventilation, critically ill. VITAL SIGNS:  Blood pressure is 105/69, heart rate 77,  respiratory rate 15, temperature 100.7, O2 saturation 100%.  PRVC 15, tidal volume 500, PEEP of 5, FIO2 50%. HEENT:  Oral mucosa is dry.  No JVD.  The patient is intubated with no cervical or supraclavicular lymphadenopathy. CARDIOVASCULAR:  Regular rhythm.  S1 and S2 normal. LUNGS:  Mildly decreased breath sounds in the pulmonary bases with no crackles, rhonchi, or wheezes. ABDOMEN:  Soft, nontender, nondistended.  Bowel sounds are active.  No peritoneal signs. EXTREMITIES:  No lower extremity edema.  No calf tenderness. NEUROLOGIC:  The patient is  sedated.  LABORATORY AND IMAGING DATA:  CBC and WBC 11.7, hemoglobin 15.6, hematocrit 45.8, platelet count 222.  Urine pregnancy test negative. Urinalysis with negative leukocytes, negative nitrites, negative ketones.  Acetaminophen level is 115.  CMP:  Sodium 138, potassium 3.2, chloride 102, bicarb 22, glucose 98, BUN 9, creatinine 0.51, alkaline phosphatase 105, AST 76, ALT 56, total protein 7.3, albumin 3.2, calcium 8.3.  Alcohol 282.  Salicylates less than 2.  Drug screen negative for amphetamines, barbiturates, benzodiazepines, cocaine, opiates and tetrahydrocannabinol.  Positive fecal occult blood.  ABG; pH 7.33, pCO2 36, pO2 349, bicarb 19.8.  PRVC 15, tidal volume 500, FIO2 100%, PEEP of 5.  Chest x-ray with endotracheal tube, 1 cm above the carina and mild bibasilar airspace opacities.  ASSESSMENT:  The patient is a 55 year old female with past medical history of alcohol abuse with episodes of recurrent acute pancreatitis, history of severe depression, gastroesophageal reflux disease, who was brought to [facility] after being found with altered mental status and with a possible drug overdose with history of alcohol abuse, who required to be intubated and started on mechanical ventilation due to acute respiratory failure secondary to altered mental status and possible drug overdose. 1. Acute respiratory failure, likely due to alcohol abuse/drug     overdose.  The patient has an elevated alcohol level and she was     found to have several bottles of her medications, less than half of     the initial prescription.  The patient initially came with altered     mental status and needed to be intubated and started on mechanical     ventilation.  Chest x-ray showed bibasilar opacities, which can be     related to acute aspiration pneumonitis.  She has an elevated white     count and she has low-grade fever at 100.7.  The list of the     medications that the patient was  taking at home includes diazepam,     Flexeril, Lomotil, Neurontin, Phenergan, and     tramadol/acetaminophen.  These medications can cause CNS toxicity     and Flexeril can cause cardiac toxicity/encases with tricyclic     antidepressant drug overdose including cardiac arrhythmias with     tachycardia includes V-tach, VFib, atrial tachycardia and     prolongation of the QR.  The patient has a heart rate of 77 at this     point in time, and we will continue following serial EKGs and ABGs.     The patient will continue on mechanical ventilatory support with     therapy with PRVC 15/500/5/50%.  We will repeat chest x-rays and     ABG in the morning. 2. Drug overdose.  Likely, the patient has elevated alcohol level and     we have to rule out drug overdose related to Flexeril.  She has     been negative benzodiazepine and opioids on blood.  We  will be     watching for possible cardiac arrhythmia secondary to Flexeril     overdose.  The patient will remain sedated with propofol and     morphine p.r.n. at this time.  We will start antibiotic treatment     with Zosyn due to chest x-ray with bibasilar infiltrates, elevated     white count and low-grade fever.  We will get blood cultures, urine     cultures and respiratory cultures. 3. Gastrointestinal bleeding.  The patient had a positive stool for     fecal blood and she also had a positive bloody secretions after     aspiration of the NG tube.  We will continue treatment for possible     peptic ulcer disease with Protonix and we will consider GI consult     in the morning.  We will follow CBCs closely.  We will transfuse if     needed.  The patient has a history of alcohol abuse and we have to     rule out gastritis/peptic ulcer disease.  We will get amylase and     lipase. 4. History of alcohol abuse.  The patient has an elevated alcohol     level and she will continue sedated and on mechanical ventilation     at this point in time.  We  will be cautious in a few days to     prevent alcohol withdrawal symptoms and DTs.  The patient will get     amylase and lipase to rule out acute-on-chronic pancreatitis. 5. History of depression.  We will hold antidepressive medications at     this point in time due to possible drug overdose and the patient     will get a Psychiatric consult once she is extubated. 6. Deep venous thrombosis and gastrointestinal prophylaxis.  The     patient will get SCDs and will hold heparin for now due to GI     bleed.  She will be started on Protonix. 7. Fluids, electrolytes, nutrition.  She will get a bolus of 1 liter     of normal saline and she will continue at 125 mL per hour.  We will     replace electrolytes as needed and we will keep her n.p.o. for now.  CODE STATUS:  Full code.  CRITICAL CARE TIME:  60 minutes.     Orbie Hurst, MD     JR/MEDQ  D:  09/20/2010  T:  09/20/2010  Job:  [id number]  Electronically Signed by Rory Percy MD on 10/28/2010 12:46:38 AM

## 2010-11-06 ENCOUNTER — Encounter (HOSPITAL_COMMUNITY): Payer: 59 | Admitting: Psychology

## 2010-11-12 ENCOUNTER — Emergency Department (HOSPITAL_COMMUNITY)
Admission: EM | Admit: 2010-11-12 | Discharge: 2010-11-13 | Disposition: A | Discharge status: Home / Self Care | Payer: 59 | Attending: Emergency Medicine | Admitting: Emergency Medicine

## 2010-11-12 DIAGNOSIS — F1010 Alcohol abuse, uncomplicated: Secondary | ICD-10-CM | POA: Insufficient documentation

## 2010-11-12 LAB — DIFFERENTIAL
Basophils Relative: 1 % (ref 0–1)
Lymphocytes Relative: 48 % — ABNORMAL HIGH (ref 12–46)
Lymphs Abs: 3.6 10*3/uL (ref 0.7–4.0)
Monocytes Absolute: 0.9 10*3/uL (ref 0.1–1.0)
Monocytes Relative: 12 % (ref 3–12)
Neutro Abs: 2.8 10*3/uL (ref 1.7–7.7)
Neutrophils Relative %: 38 % — ABNORMAL LOW (ref 43–77)

## 2010-11-12 LAB — CBC
HCT: 45.7 % (ref 36.0–46.0)
Hemoglobin: 15.4 g/dL — ABNORMAL HIGH (ref 12.0–15.0)
MCH: 32 pg (ref 26.0–34.0)
MCHC: 33.7 g/dL (ref 30.0–36.0)
MCV: 95 fL (ref 78.0–100.0)
RBC: 4.81 MIL/uL (ref 3.87–5.11)

## 2010-11-12 LAB — COMPREHENSIVE METABOLIC PANEL
AST: 73 U/L — ABNORMAL HIGH (ref 0–37)
Albumin: 3.9 g/dL (ref 3.5–5.2)
Alkaline Phosphatase: 131 U/L — ABNORMAL HIGH (ref 39–117)
BUN: 10 mg/dL (ref 6–23)
Chloride: 100 mEq/L (ref 96–112)
Potassium: 3.6 mEq/L (ref 3.5–5.1)
Total Bilirubin: 0.3 mg/dL (ref 0.3–1.2)
Total Protein: 8.7 g/dL — ABNORMAL HIGH (ref 6.0–8.3)

## 2010-11-12 LAB — RAPID URINE DRUG SCREEN, HOSP PERFORMED
Amphetamines: NOT DETECTED
Barbiturates: NOT DETECTED
Opiates: NOT DETECTED
Tetrahydrocannabinol: NOT DETECTED

## 2010-11-12 LAB — ETHANOL: Alcohol, Ethyl (B): 318 mg/dL — ABNORMAL HIGH (ref 0–11)

## 2010-11-27 ENCOUNTER — Inpatient Hospital Stay (HOSPITAL_COMMUNITY)
Admission: EM | Admit: 2010-11-27 | Discharge: 2010-11-29 | DRG: 392 | Discharge status: Against Medical Advice | Payer: 59 | Attending: Internal Medicine | Admitting: Internal Medicine

## 2010-11-27 ENCOUNTER — Encounter (HOSPITAL_COMMUNITY): Payer: Self-pay | Admitting: Radiology

## 2010-11-27 ENCOUNTER — Encounter: Payer: Self-pay | Admitting: Internal Medicine

## 2010-11-27 ENCOUNTER — Emergency Department (HOSPITAL_COMMUNITY): Payer: 59

## 2010-11-27 DIAGNOSIS — F3289 Other specified depressive episodes: Secondary | ICD-10-CM | POA: Diagnosis present

## 2010-11-27 DIAGNOSIS — R1013 Epigastric pain: Principal | ICD-10-CM | POA: Diagnosis present

## 2010-11-27 DIAGNOSIS — R109 Unspecified abdominal pain: Secondary | ICD-10-CM

## 2010-11-27 DIAGNOSIS — K7030 Alcoholic cirrhosis of liver without ascites: Secondary | ICD-10-CM | POA: Diagnosis present

## 2010-11-27 DIAGNOSIS — K219 Gastro-esophageal reflux disease without esophagitis: Secondary | ICD-10-CM | POA: Diagnosis present

## 2010-11-27 DIAGNOSIS — R0902 Hypoxemia: Secondary | ICD-10-CM | POA: Diagnosis present

## 2010-11-27 DIAGNOSIS — F329 Major depressive disorder, single episode, unspecified: Secondary | ICD-10-CM | POA: Diagnosis present

## 2010-11-27 DIAGNOSIS — F1020 Alcohol dependence, uncomplicated: Secondary | ICD-10-CM | POA: Diagnosis present

## 2010-11-27 DIAGNOSIS — K449 Diaphragmatic hernia without obstruction or gangrene: Secondary | ICD-10-CM | POA: Diagnosis present

## 2010-11-27 LAB — DIFFERENTIAL
Eosinophils Relative: 0 % (ref 0–5)
Lymphocytes Relative: 26 % (ref 12–46)
Lymphs Abs: 2.9 10*3/uL (ref 0.7–4.0)
Monocytes Relative: 9 % (ref 3–12)
Neutrophils Relative %: 64 % (ref 43–77)

## 2010-11-27 LAB — COMPREHENSIVE METABOLIC PANEL
Albumin: 3.5 g/dL (ref 3.5–5.2)
BUN: 7 mg/dL (ref 6–23)
Calcium: 9.2 mg/dL (ref 8.4–10.5)
Creatinine, Ser: 0.51 mg/dL (ref 0.50–1.10)
GFR calc Af Amer: >60 mL/min (ref >60)
Glucose, Bld: 98 mg/dL (ref 70–99)
Total Protein: 7.8 g/dL (ref 6.0–8.3)

## 2010-11-27 LAB — URINALYSIS, ROUTINE W REFLEX MICROSCOPIC
Bilirubin Urine: NEGATIVE
Bilirubin Urine: NEGATIVE
Glucose, UA: NEGATIVE mg/dL
Nitrite: NEGATIVE
Nitrite: NEGATIVE
Specific Gravity, Urine: 1.017 (ref 1.005–1.030)
Specific Gravity, Urine: >1.046 — ABNORMAL HIGH (ref 1.005–1.030)
Urobilinogen, UA: 0.2 mg/dL (ref 0.0–1.0)
pH: 7 (ref 5.0–8.0)
pH: 6.5 (ref 5.0–8.0)

## 2010-11-27 LAB — CBC
HCT: 40.7 % (ref 36.0–46.0)
MCH: 33 pg (ref 26.0–34.0)
MCV: 92.5 fL (ref 78.0–100.0)
RBC: 4.4 MIL/uL (ref 3.87–5.11)
WBC: 11.3 10*3/uL — ABNORMAL HIGH (ref 4.0–10.5)

## 2010-11-27 LAB — LIPASE, BLOOD: Lipase: 62 U/L — ABNORMAL HIGH (ref 11–59)

## 2010-11-27 LAB — URINE MICROSCOPIC-ADD ON

## 2010-11-27 LAB — MAGNESIUM: Magnesium: 1.6 mg/dL (ref 1.5–2.5)

## 2010-11-27 LAB — LACTIC ACID, PLASMA
Lactic Acid, Venous: 6.4 mmol/L — ABNORMAL HIGH (ref 0.5–2.2)
Lactic Acid, Venous: 4.6 mmol/L — ABNORMAL HIGH (ref 0.5–2.2)

## 2010-11-27 LAB — SALICYLATE LEVEL: Salicylate Lvl: <2 mg/dL — ABNORMAL LOW (ref 2.8–20.0)

## 2010-11-27 LAB — HEMOGLOBIN A1C: Mean Plasma Glucose: 126 mg/dL — ABNORMAL HIGH (ref <117)

## 2010-11-27 LAB — PROTIME-INR: Prothrombin Time: 12.3 seconds (ref 11.6–15.2)

## 2010-11-27 LAB — PHOSPHORUS: Phosphorus: 4.2 mg/dL (ref 2.3–4.6)

## 2010-11-27 MED ORDER — IOHEXOL 300 MG/ML  SOLN
80.0000 mL | Freq: Once | INTRAMUSCULAR | Status: AC | PRN
  Administered 2010-11-27: 80 mL via INTRAVENOUS

## 2010-11-27 NOTE — H&P (Signed)
Hospital Admission Note Date: 11/27/2010  Patient name: [name] Medical record number: [id number] Date of birth: 10-07-1955 Age: 55 y.o. Gender: female PCP: Dr. Thea Silversmith of [facility] Medical Service: Teaching Service  Attending physician:  Margarito Liner    Resident (R2/R3): Darnelle Maffucci   Pager: [phone] Resident (R1): Delorse Limber  Pager: [phone]  Chief Complaint: Abdominal Pain  History of Present Illness: This is a 55 year old woman with a history of pancreatitis, alcohol abuse, hypertension, and GERD who presents with 2-3 days of progressive epigastric abdominal pain. The patient reports a deep pain that radiates to her back that she says feels similar to the pain she had when she had pancreatitis in October of last year. This pain has been constant in nature and she has not done any relief with ibuprofen. Patient also claims of nausea with no vomiting because she has not been able to eat anything recently, but that the few times she has tried she feels it is worsened her pain. She also complains of chronic diarrhea ever since her episode of pancreatitis. She denies any blood in the diarrhea.  Patient also complains of a left-sided sharp pain in the lateral mid abdomen that is intermittent and less severe in nature but also progressively worsening since Tuesday.  Patient denies any chest pain, shortness of breath, dysuria, urinary frequency, fevers, or chills.  Past Medical Hx:   Diagnosis Date  . GERD (gastroesophageal reflux disease)   . Rosacea   . Elevated liver function tests   . Wears glasses   . Chronic diarrhea   . Pancreatitis 2011    Hospitalization   Past Surgical History  Procedure Date  . Cholecystectomy   . Shoulder surgery 02-18-2010  . Diagnostic mammogram 2008  . Tubal ligation   . Colonoscopy Never    Meds: Says she does not take any at home, only occasional left over tramadol from  surgergy  Allergies: Ciprofloxacin, but patient says she is not aware of any allergies.  Family History  Problem Relation Age of Onset  . Stroke Mother   . Heart disease Father   . Heart disease Sister    father with alcoholism  Social Hx: Social History  . Marital Status: Divorced    Spouse Name: N/A    Number of Children: N/A  . Years of Education: N/A   Occupational History  . Not on file.   Social History Main Topics  . Smoking status: Former Games developer  . Smokeless tobacco: Never Used  . Alcohol Use: Yes     3-4 handles of vodka per week   . Drug Use: No  . Sexually Active: Not on file     Works in data entry and accounting at [employer]    Review of Systems: As per HPI  Physical Exam: Temp 98.3, BP 145/77, heart rate 89, RR 16, O2 sat 97% on room air GEN: No apparent distress.  Alert and oriented x 3.  Pleasant, conversant, and cooperative to exam. HEENT: head is autraumatic and normocephalic.  Neck is supple without palpable masses or lymphadenopathy. EOMI.  PERRLA.  Sclerae anicteric. Oropharynx is without erythema, exudates, or other abnormal lesions. RESP:  Lungs are clear to ascultation bilaterally with good air movement.  No wheezes, ronchi, or rubs. CARDIOVASCULAR: regular rate, normal rhythm.  Clear S1, S2, no murmurs, gallops, or rubs. ABDOMEN: soft, non-tender (pt just got dilaudid), non-distended.  Bowels sounds present in all quadrants and normoactive.  No palpable masses. EXT: warm  and dry.  No clubbing or cyanosis.  No edema in b/l lower extremeties SKIN: Roughly golf ball-sized ecchymoses over left subchondral region. warm and dry with normal turgor.  No rashes. NEURO: CN II-XII grossly intact.  Muscle strength +5/5 in bilateral upper and lower extremities.  Sensation is grossly intact.  No focal deficit.  Lab results: Basic Metabolic Panel: Recent Labs  [name] 11/27/10 1243   NA 141   K 3.7   CL 99   CO2 22   GLUCOSE 98   BUN 7   CREATININE  0.51   CALCIUM 9.2   MG --   PHOS --   Liver Function Tests: Recent Labs  [facility] 11/27/10 1243   AST 177*   ALT 114*   ALKPHOS 155*   BILITOT 0.4   PROT 7.8   ALBUMIN 3.5   Recent Labs  [facility] 11/27/10 1243   LIPASE 62*   AMYLASE --   CBC: Recent Labs  [facility] 11/27/10 1243   WBC 11.3*   NEUTROABS 7.2   HGB 14.5   HCT 40.7   MCV 92.5   PLT 236   Misc. Labs: Lactic Acid, Venous                      6.4        h      0.5-2.2          Mmol/L  12pm Lactic Acid, Venous                      4.6        h      0.5-2.2          Mmol/L   4pm  Urinalysis:  Result Name                              Result     Abnl   Normal Range     Units      Perf. Loc.  Color, Urine                             YELLOW            YELLOW  Appearance                               CLOUDY     a      CLEAR  Specific Gravity                         1.017             1.005-1.030  pH                                       7.0               5.0-8.0  Urine Glucose                            NEGATIVE          NEG              mg/dL  Bilirubin  NEGATIVE          NEG  Ketones                                  40         a      NEG              mg/dL  Blood                                    NEGATIVE          NEG  Protein                                  NEGATIVE          NEG              mg/dL  Urobilinogen                             0.2               0.0-1.0          mg/dL  Nitrite                                  NEGATIVE          NEG  Leukocytes                               MODERATE   a      NEG   Squamous Epithelial / LPF                MANY       a      RARE  WBC / HPF                                11-20             <3               WBC/hpf  RBC / HPF                                0-2               <3               RBC/hpf  Bacteria / HPF                           FEW        a      RARE  Urine-Other                              SEE NOTE.    MUCOUS  PRESENT  Imaging results:  CT abdomen: IMPRESSION:   1.  Stable moderate hiatus hernia, cholecystectomy/bile ducts,   diffuse fatty infiltration of  liver.   2.  Interval resolution previous acute pancreatitis findings with   interval 13 mm pancreatic body marginated - likely pseudocyst.   3.  Incidental tiny mid left renal cystic foci with stable   degenerative disc disease L4-5 and L5-S1.   4.  Otherwise, negative.  Other results: EKG: Normal sinus rhythm unchanged from previous EKGs  Assessment & Plan by Problem: #Abdominal pain Patient appears stable at this time without any pain on exam, although she had gotten pain medicine recently. The differential remains broad, and possible etiologies include pancreatitis (though normal lipase), alcoholic ketoacidosis (anion gap, history of alcohol abuse), alcohol gastritis, GERD (given history), PUD, alcoholic or viral hepatitis, or residual biliary pathology (patient is status post cholecystectomy). CT scan not indicative of acute pancreatitis though a pseudocyst was noted. Will continue to workup, treat pain. --Abdominal ultrasound --Serum osmolality, serum ketone level, salicylate level --Serum ethanol level, methanol level, ethylene glycol level --Repeat UA --Hemoccult stools --Trend liver enzymes --IV protonix  #Anion gap This patient presents with an anion gap 20 as well as a lactate of 6.4.  Possible etiologies include alcoholic ketoacidosis given her history of alcoholism and the ketones in her urine, lactic acidosis secondary to a yet to be defined cause (over one half of cases of alcoholic ketoacidosis have a hypoperfusion-induced lactic acidosis), or some other ingestion. The patient is not diabetic and is not in renal failure. --See above --abg --Trend gap  #Alcohol abuse Patient reports last drink on Tuesday (roughly 2 days ago), and is at risk for withdrawal. We will closely monitor this patient --Thiamine, folate --CIWA  protocol   #PPx --lovenox 40mg  --protonix  #Dispo pending    R2/3______________________________      R1________________________________  ATTENDING: I performed and/or observed a history and physical examination of the patient.  I discussed the case with the residents as noted and reviewed the residents' notes.  I agree with the findings and plan--please refer to the attending physician note for more details.  Signature________________________________  Printed Name_____________________________

## 2010-11-28 ENCOUNTER — Inpatient Hospital Stay (HOSPITAL_COMMUNITY): Payer: 59

## 2010-11-28 ENCOUNTER — Other Ambulatory Visit (HOSPITAL_COMMUNITY): Payer: 59

## 2010-11-28 DIAGNOSIS — R109 Unspecified abdominal pain: Secondary | ICD-10-CM

## 2010-11-28 LAB — GLUCOSE, CAPILLARY

## 2010-11-28 LAB — ALCOHOL, METHYL (METHANOL), BLOOD: Methanol Lvl: NEGATIVE

## 2010-11-28 LAB — BASIC METABOLIC PANEL
BUN: 10 mg/dL (ref 6–23)
Chloride: 102 mEq/L (ref 96–112)
Glucose, Bld: 84 mg/dL (ref 70–99)
Potassium: 3.6 mEq/L (ref 3.5–5.1)

## 2010-11-28 LAB — BLOOD GAS, ARTERIAL
Acid-Base Excess: 4.3 mmol/L — ABNORMAL HIGH (ref 0.0–2.0)
Bicarbonate: 27.9 mEq/L — ABNORMAL HIGH (ref 20.0–24.0)
O2 Saturation: 94.1 %
pO2, Arterial: 64.7 mmHg — ABNORMAL LOW (ref 80.0–100.0)

## 2010-11-28 LAB — CBC
Hemoglobin: 11.9 g/dL — ABNORMAL LOW (ref 12.0–15.0)
MCH: 32.2 pg (ref 26.0–34.0)
MCV: 94.9 fL (ref 78.0–100.0)
Platelets: 168 10*3/uL (ref 150–400)
RBC: 3.7 MIL/uL — ABNORMAL LOW (ref 3.87–5.11)

## 2010-11-28 LAB — ETHYLENE GLYCOL: Ethylene Glycol Lvl: NEGATIVE

## 2010-11-29 ENCOUNTER — Other Ambulatory Visit: Payer: Self-pay | Admitting: Gastroenterology

## 2010-11-29 LAB — COMPREHENSIVE METABOLIC PANEL
Albumin: 2.9 g/dL — ABNORMAL LOW (ref 3.5–5.2)
Alkaline Phosphatase: 148 U/L — ABNORMAL HIGH (ref 39–117)
BUN: 9 mg/dL (ref 6–23)
Chloride: 101 mEq/L (ref 96–112)
Creatinine, Ser: 0.55 mg/dL (ref 0.50–1.10)
GFR calc Af Amer: >60 mL/min (ref >60)
Glucose, Bld: 89 mg/dL (ref 70–99)
Potassium: 3.4 mEq/L — ABNORMAL LOW (ref 3.5–5.1)
Total Bilirubin: 1.2 mg/dL (ref 0.3–1.2)

## 2010-11-29 LAB — CBC
MCH: 32.2 pg (ref 26.0–34.0)
Platelets: 135 10*3/uL — ABNORMAL LOW (ref 150–400)
RBC: 3.66 MIL/uL — ABNORMAL LOW (ref 3.87–5.11)
WBC: 9.1 10*3/uL (ref 4.0–10.5)

## 2010-11-29 LAB — HIV ANTIBODY (ROUTINE TESTING W REFLEX): HIV: NONREACTIVE

## 2010-12-05 LAB — CULTURE, BLOOD (ROUTINE X 2)
Culture  Setup Time: 201208111241
Culture: NO GROWTH
Culture: NO GROWTH

## 2010-12-09 NOTE — Op Note (Signed)
  [name], [name]            ACCOUNT NO.:  [id number]  MEDICAL RECORD NO.:  [id number]  LOCATION:  3312                         FACILITY:  [facility]  PHYSICIAN:  Shirley Friar, MDDATE OF BIRTH:  23-Oct-1955  DATE OF PROCEDURE: DATE OF DISCHARGE:                              OPERATIVE REPORT   PROCEDURE:  Upper endoscopy.  INDICATIONS:  Abdominal pain.  MEDICATIONS:  Cetacaine spray x2, fentanyl 75 mcg IV, Versed 5 mg IV.  FINDINGS:  Endoscope was inserted through oropharynx and esophagus was intubated which was normal in its entirety.  Endoscope was advanced down to the stomach revealed normal-appearing gastric mucosa.  Retroflexion was done which revealed medium-sized hiatal hernia.  Endoscope was straightened, advanced into the duodenal bulb and second portion of duodenum which revealed normal-appearing duodenal mucosa.  Biopsies were taken of the duodenum for histologic purposes to check for celiac sprue. Endoscope was withdrawn to confirm above findings.  ASSESSMENT: 1. Medium-sized hiatal hernia, otherwise normal upper endoscopy. 2. Status post duodenal biopsies to check for celiac sprue.  PLAN:  Supportive care follow up on pathology as an outpatient.     Shirley Friar, MD     VCS/MEDQ  D:  11/29/2010  T:  11/29/2010  Job:  [id number]  cc:   Willis Modena, MD  Electronically Signed by Charlott Rakes MD on 12/09/2010 04:51:14 PM

## 2010-12-16 NOTE — Discharge Summary (Signed)
[name], [name]            ACCOUNT NO.:  [id number]  MEDICAL RECORD NO.:  [id number]  LOCATION:  3312                         FACILITY:  [facility]  PHYSICIAN:  Ileana Roup, M.D.  DATE OF BIRTH:  1956/02/08  DATE OF ADMISSION:  11/27/2010 DATE OF DISCHARGE:  11/29/2010                              DISCHARGE SUMMARY   DISCHARGE DIAGNOSES: 1. Abdominal pain without any identified pathology. 2. History of gastroesophageal reflux disease. 3. History of pancreatitis. 4. History of tubal ligation. 5. History of shoulder surgery. 6. History of cholecystectomy.  DISCHARGE MEDICATIONS:  None as the patient left against medical advice.  DISPOSITION AND FOLLOWUP:  None.  The patient left against medical advice.  CONSULTATION:  Gastroenterology were consulted.  They performed an EGD.  PROCEDURES:  EGD: completed November 29, 2010, which was negative for any  pathology and only displayed hiatal hernia which was already documented  in this patient, and this did not explain her pain.  BRIEF ADMITTING HISTORY AND PHYSICAL:  The patient is a 55 year old female with past medical history of pancreatitis, alcohol abuse, hypertension and GERD who presented with a 2-3 day progressive epigastric pain at the emergency room of [facility].  The patient reported deep pain that radiated to her back.  She said that she felt similar pain when she had the pancreatitis in October 2011.  The pain was constant in nature and was not relieved by over-the-counter nonsteroidal anti-inflammatory medications.  The patient also had nausea but no vomiting and also reported that she has not been able to tolerate any p.o. intake.  She denied any bloody diarrhea.  She also complained of left-sided sharp pain in the lateral midabdomen that was intermittent and less severe in nature but has been progressing for several days prior to admission.  The patient at admission denied any chest pain, shortness of  breath, dysuria, urinary frequency, fevers or chills.  PHYSICAL EXAMINATION:  VITAL SIGNS:  Temperature 98.3, blood pressure 145/77, heart rate 89, respiratory rate 16, O2 saturation 97% on room air. GENERAL:  Appearing in no acute distress, alert and oriented x3. HEENT:  Head is atraumatic and normocephalic. RESPIRATORY:  Lungs were clear to auscultation bilaterally with good air movement.  No wheezing.  No rhonchi or rubs. CARDIOVASCULAR:  Regular rate and rhythm.  No murmurs, rubs or gallops. ABDOMEN:  Soft and nontender.  However, this was after multiple doses of IV morphine.  No distention, no rebound.  Positive bowel sounds present in all quadrants.  EXTREMITIES:  Warm and dry with no clubbing, cyanosis or edema. SKIN:  Normal. NEUROLOGICAL:  Grossly nonfocal.  ADMISSION LABORATORIES:  Sodium 141, potassium 3.7, chloride 99, bicarb 22, glucose 98, BUN 7, creatinine 0.51, calcium 9.1.  White blood cell count 11.3, hemoglobin 14.5, hematocrit 40.7, and platelet count 236.  HOSPITAL COURSE BY PROBLEM:1. Abdominal pain.  At that time the etiology was unclear.  Lipase and     amylase were negative, therefore, this made the diagnosis of     pancreatitis less likely.  An abdominal CT was performed which was     negative for any acute pathology.  Others on the differentials were     alcoholic  gastritis given the patient has a history of severe     alcohol abuse and hepatobiliary etiology was also considered.  Right     upper quadrant ultrasound was done which did not show any     pathology.  GI were consulted and performed an EGD on November 29, 2010.  This did not show any signs of gastritis and the biopsy was     obtained.  Also during the course of her hospitalization, the     patient required multiple doses of IV morphine and complained her     pain was not well controlled.  However, after the EGD was done and     the patient was informed that it was negative, the patient  refused     to stay in the hospital and left the hospital against medical advice. 2. Alcohol abuse.  The patient was placed on CIWA protocol.  She     required several doses of Ativan through the hospitalization and     was given thiamine and folate. 3. History of  gastroesophageal reflux disease.  Given the potential     diagnosis for gastritis, the patient was started on intravenous     Protonix and was continued throughout her hospitalization.  LABORATORY DATA AND VITALS:  On the day the patient left against medical advice, temperature 98.4, blood pressure 128/62, pulse 72, respiratory rate 11, O2 saturation 100% on room air.  White count is 9.1, hemoglobin 11.8, hematocrit 35.2, platelet count 135.  Sodium 140, potassium 3.4, chloride 101, bicarb 29, BUN 9, creatinine 0.55, glucose 98.     Darnelle Maffucci, MD   ______________________________ Ileana Roup, M.D.    PT/MEDQ  D:  11/29/2010  T:  11/29/2010  Job:  [id number]  cc:   Dr. Thea Silversmith  Electronically Signed by Darnelle Maffucci  on 12/07/2010 03:27:11 PM Electronically Signed by Margarito Liner M.D. on 12/16/2010 06:58:20 PM

## 2011-01-14 NOTE — Consult Note (Signed)
[name], [name] NO.:  [id number]  MEDICAL RECORD NO.:  [id number]  LOCATION:  3312                         FACILITY:  [facility]  PHYSICIAN:  Willis Modena, MD     DATE OF BIRTH:  March 28, 1956  DATE OF CONSULTATION:  11/28/2010 DATE OF DISCHARGE:                                CONSULTATION   REFERRING PHYSICIAN:  Ileana Roup, MD  REASON FOR CONSULTATION:  Abdominal pain.  CHIEF COMPLAINT:  Abdominal pain.  HISTORY OF PRESENT ILLNESS:  [name] is a 55 year old female with multiple medical problems including alcohol abuse and history of pancreatitis.  She has had a history of chronic left upper quadrant and epigastric discomfort but this became much worse over the past few days. She actually had a recent bout of similar pain and was diagnosed with pancreatitis.  On presentation today, she has no objective evidence of pancreatitis on CT scan.  Her lipase has been only modestly elevated.  She does endorse a history of diarrhea post dating the cholecystectomy without any overt blood or stool.  She has no nausea, vomiting, dysphagia, odynophagia, loss of appetite or weight loss.  Past medical history, past surgical history, home medications, allergies, family history, social history, review of systems all from the typed out note in the chart done by Dr. Meredith Pel dated November 27, 2010. I have reviewed and I agree.  PHYSICAL EXAMINATION:  VITAL SIGNS:  Blood pressure is 130s to 150s over 70s to 80s, temperature 98.5, heart rate about 90, respiratory rate 14. GENERAL:  Ms. Cousineau is older-appearing and stated age.  She is not acutely toxic-appearing. EYES:  Sclerae anicteric.  Conjunctiva pink. HEENT:  Normocephalic, atraumatic.  No oropharyngeal lesions. NECK:  Supple. LUNGS:  Clear. HEART:  Regular. ABDOMEN:  Soft, mildly protuberant, mild epigastric tenderness. Normoactive bowel sounds with no liver or splenic enlargement.  No bulging flanks to  suggest ascites. EXTREMITIES:  No peripheral cyanosis, clubbing or edema. NEUROLOGIC:  Diffusely weak, nonfocal without lateralizing signs. SKIN:  No rashes or ecchymoses. LYMPHATICS:  No palpable axillary, submandibular, or supraclavicular adenopathy. NEUROLOGIC:  Diffusely weak but nonfocal without lateralizing signs.  No encephalopathy.  LABORATORY STUDIES:  Hemoglobin 11.9, 14.5 on admission, white count is 11.4, platelet count is 168.  Sodium 141, potassium 3.6, chloride 102, bicarb 27, BUN 10, creatinine 0.6.  Total bilirubin 0.4, alk phos 155, AST 177, ALT 114, total protein is 7.8, albumin 3.5, lactic acid is 6.4 on arrival, currently 4.6, lipase is 62.  Salicylate and alcohol levels were negative as was ethylene glycol and methanol levels.  Urine specific gravity is greater than 1.046.  RADIOLOGIC STUDIES:  As mentioned include abdominal ultrasound shows a bile duct enlarged at 10 mm postcholecystectomy, diffusely fatty liver. CT scan again shows diffusely fatty liver.  __________ postcholecystectomy anatomy, small cyst in the body of the pancreas.  IMPRESSION:  [name] is a 55 year old female presenting with acute- on-chronic abdominal pain in the epigastric area.  She has a history of alcoholic pancreatitis but no objective features to suggest that at this time.  PLAN: 1. Agree with supportive management as you are doing with IV fluids,     PPI therapy. 2.  We will pursue upper endoscopy tomorrow. 3. If the upper endoscopy is negative, one could consider supportive     management of her pain with empiric symptom-base therapies. 4. One could consider ultimately outpatient endoscopic ultrasound to     assess for choledocholithiasis and/or chronic pancreatitis, if her     symptoms should persist. 5. The patient's diarrhea is likely postcholecystectomy related.  It     can be treated accordingly with cholestyramine.  Chronic     pancreatitis is another differential  and one might consider a trial     of pancreatic enzymes, both for her pain and for her diarrhea,     should her upper endoscopy tomorrow be negative.  Thanks again for allowing me to participate in Ms. Schnee's care.     Willis Modena, MD     WO/MEDQ  D:  11/28/2010  T:  11/29/2010  Job:  [id number]  Electronically Signed by Willis Modena  on 01/14/2011 05:50:36 PM

## 2011-01-26 LAB — BASIC METABOLIC PANEL
CO2: 23
Calcium: 9.1
Chloride: 104
GFR calc Af Amer: >60
Glucose, Bld: 98
Potassium: 3.7
Sodium: 139

## 2011-01-26 LAB — CBC
HCT: 42
Hemoglobin: 14.5
MCHC: 34.5
MCV: 97.6
RBC: 4.31
RDW: 13.1

## 2011-01-26 LAB — DIFFERENTIAL
Basophils Absolute: 0
Basophils Relative: 0
Eosinophils Relative: 0
Monocytes Absolute: 0.7
Monocytes Relative: 5
Neutro Abs: 9.3 — ABNORMAL HIGH

## 2011-01-26 LAB — RAPID URINE DRUG SCREEN, HOSP PERFORMED
Barbiturates: NOT DETECTED
Opiates: NOT DETECTED

## 2011-02-03 LAB — CBC
HCT: 44.4
MCHC: 34.5
MCV: 97.5
Platelets: 357
RDW: 13.3

## 2011-02-03 LAB — BASIC METABOLIC PANEL
BUN: 10
Chloride: 106
Creatinine, Ser: 0.92
Glucose, Bld: 153 — ABNORMAL HIGH
Potassium: 4.1

## 2011-02-03 LAB — RAPID URINE DRUG SCREEN, HOSP PERFORMED
Amphetamines: NOT DETECTED
Opiates: NOT DETECTED
Tetrahydrocannabinol: NOT DETECTED

## 2011-02-03 LAB — DIFFERENTIAL
Basophils Absolute: 0.2 — ABNORMAL HIGH
Eosinophils Absolute: 0.1
Lymphs Abs: 3.2
Monocytes Absolute: 0.7

## 2011-02-03 LAB — URINALYSIS, ROUTINE W REFLEX MICROSCOPIC
Bilirubin Urine: NEGATIVE
Nitrite: NEGATIVE
Specific Gravity, Urine: 1.016
Urobilinogen, UA: 0.2

## 2011-02-03 LAB — URINE MICROSCOPIC-ADD ON

## 2011-03-17 ENCOUNTER — Encounter (HOSPITAL_COMMUNITY): Payer: Self-pay | Admitting: *Deleted

## 2011-03-17 ENCOUNTER — Inpatient Hospital Stay (HOSPITAL_COMMUNITY)
Admission: EM | Admit: 2011-03-17 | Discharge: 2011-03-26 | DRG: 438 | Disposition: A | Discharge status: Home / Self Care | Payer: Self-pay | Source: Ambulatory Visit | Attending: Internal Medicine | Admitting: Internal Medicine

## 2011-03-17 DIAGNOSIS — R0789 Other chest pain: Secondary | ICD-10-CM

## 2011-03-17 DIAGNOSIS — I959 Hypotension, unspecified: Secondary | ICD-10-CM | POA: Diagnosis not present

## 2011-03-17 DIAGNOSIS — E876 Hypokalemia: Secondary | ICD-10-CM

## 2011-03-17 DIAGNOSIS — F10231 Alcohol dependence with withdrawal delirium: Secondary | ICD-10-CM

## 2011-03-17 DIAGNOSIS — F3289 Other specified depressive episodes: Secondary | ICD-10-CM

## 2011-03-17 DIAGNOSIS — K226 Gastro-esophageal laceration-hemorrhage syndrome: Secondary | ICD-10-CM | POA: Diagnosis not present

## 2011-03-17 DIAGNOSIS — K922 Gastrointestinal hemorrhage, unspecified: Secondary | ICD-10-CM

## 2011-03-17 DIAGNOSIS — D62 Acute posthemorrhagic anemia: Secondary | ICD-10-CM | POA: Diagnosis not present

## 2011-03-17 DIAGNOSIS — K219 Gastro-esophageal reflux disease without esophagitis: Secondary | ICD-10-CM

## 2011-03-17 DIAGNOSIS — K224 Dyskinesia of esophagus: Secondary | ICD-10-CM

## 2011-03-17 DIAGNOSIS — K859 Acute pancreatitis without necrosis or infection, unspecified: Principal | ICD-10-CM

## 2011-03-17 DIAGNOSIS — K862 Cyst of pancreas: Secondary | ICD-10-CM | POA: Diagnosis present

## 2011-03-17 DIAGNOSIS — K449 Diaphragmatic hernia without obstruction or gangrene: Secondary | ICD-10-CM

## 2011-03-17 DIAGNOSIS — F10931 Alcohol use, unspecified with withdrawal delirium: Secondary | ICD-10-CM

## 2011-03-17 DIAGNOSIS — F329 Major depressive disorder, single episode, unspecified: Secondary | ICD-10-CM

## 2011-03-17 DIAGNOSIS — K700 Alcoholic fatty liver: Secondary | ICD-10-CM | POA: Diagnosis present

## 2011-03-17 DIAGNOSIS — R7989 Other specified abnormal findings of blood chemistry: Secondary | ICD-10-CM

## 2011-03-17 DIAGNOSIS — F1021 Alcohol dependence, in remission: Secondary | ICD-10-CM

## 2011-03-17 DIAGNOSIS — R05 Cough: Secondary | ICD-10-CM | POA: Diagnosis not present

## 2011-03-17 DIAGNOSIS — K7689 Other specified diseases of liver: Secondary | ICD-10-CM

## 2011-03-17 DIAGNOSIS — R197 Diarrhea, unspecified: Secondary | ICD-10-CM

## 2011-03-17 DIAGNOSIS — F1020 Alcohol dependence, uncomplicated: Secondary | ICD-10-CM | POA: Diagnosis present

## 2011-03-17 DIAGNOSIS — L719 Rosacea, unspecified: Secondary | ICD-10-CM | POA: Diagnosis present

## 2011-03-17 DIAGNOSIS — F411 Generalized anxiety disorder: Secondary | ICD-10-CM

## 2011-03-17 DIAGNOSIS — F1010 Alcohol abuse, uncomplicated: Secondary | ICD-10-CM

## 2011-03-17 DIAGNOSIS — J9690 Respiratory failure, unspecified, unspecified whether with hypoxia or hypercapnia: Secondary | ICD-10-CM

## 2011-03-17 DIAGNOSIS — K208 Other esophagitis without bleeding: Secondary | ICD-10-CM | POA: Diagnosis present

## 2011-03-17 DIAGNOSIS — R059 Cough, unspecified: Secondary | ICD-10-CM | POA: Diagnosis not present

## 2011-03-17 HISTORY — DX: Alcohol abuse, uncomplicated: F10.10

## 2011-03-17 MED ORDER — ONDANSETRON HCL 4 MG/2ML IJ SOLN: 4.0000 mg | Freq: Once | INTRAMUSCULAR | Status: DC

## 2011-03-17 MED ORDER — HYDROMORPHONE HCL PF 1 MG/ML IJ SOLN
1.0000 mg | Freq: Once | INTRAMUSCULAR | Status: AC
  Administered 2011-03-17: 1 mg via INTRAVENOUS

## 2011-03-17 MED ORDER — ONDANSETRON HCL 4 MG/2ML IJ SOLN
4.0000 mg | Freq: Once | INTRAMUSCULAR | Status: AC
  Administered 2011-03-17 – 2011-03-18 (×2): 4 mg via INTRAVENOUS

## 2011-03-17 NOTE — ED Notes (Signed)
Pt reports epigastric/LUQ pain that began approx 1600 - pt denies n/v/d or fever. Pt reports acute onset of pain while she was at work. Pt restless and grimacing during assessment. Pt w/ hx of cholecystectomy and pancreatitis - states this pain feels different from her pancreatitis.

## 2011-03-17 NOTE — ED Notes (Signed)
Pt wanting to leave, encouraged to stay, plan, process & wait explained, agreed to stay 30 minutes. Alert, interactive, calm, frustrated, c/o abd pain.

## 2011-03-17 NOTE — ED Notes (Signed)
Per EMS - pt from home - pt c/o sharp LUQ pain that began approx1600 - pt also noted to have "lump" approx 2 fingerbreadths below sternum which is tender to palpation. Pt admits to diarrhea x1 episode - denies n/v.

## 2011-03-18 ENCOUNTER — Emergency Department (HOSPITAL_COMMUNITY): Payer: Self-pay

## 2011-03-18 ENCOUNTER — Other Ambulatory Visit: Payer: Self-pay

## 2011-03-18 ENCOUNTER — Encounter (HOSPITAL_COMMUNITY): Payer: Self-pay | Admitting: Radiology

## 2011-03-18 DIAGNOSIS — K219 Gastro-esophageal reflux disease without esophagitis: Secondary | ICD-10-CM | POA: Insufficient documentation

## 2011-03-18 DIAGNOSIS — F1010 Alcohol abuse, uncomplicated: Secondary | ICD-10-CM | POA: Diagnosis present

## 2011-03-18 LAB — COMPREHENSIVE METABOLIC PANEL
ALT: 41 U/L — ABNORMAL HIGH (ref 0–35)
ALT: 53 U/L — ABNORMAL HIGH (ref 0–35)
AST: 45 U/L — ABNORMAL HIGH (ref 0–37)
Albumin: 3.1 g/dL — ABNORMAL LOW (ref 3.5–5.2)
Alkaline Phosphatase: 82 U/L (ref 39–117)
Alkaline Phosphatase: 88 U/L (ref 39–117)
BUN: 9 mg/dL (ref 6–23)
CO2: 23 mEq/L (ref 19–32)
CO2: 27 mEq/L (ref 19–32)
Calcium: 8.5 mg/dL (ref 8.4–10.5)
Chloride: 104 mEq/L (ref 96–112)
Creatinine, Ser: 0.58 mg/dL (ref 0.50–1.10)
GFR calc Af Amer: >90 mL/min (ref >90)
GFR calc Af Amer: >90 mL/min (ref >90)
GFR calc non Af Amer: >90 mL/min (ref >90)
GFR calc non Af Amer: >90 mL/min (ref >90)
Glucose, Bld: 105 mg/dL — ABNORMAL HIGH (ref 70–99)
Glucose, Bld: 98 mg/dL (ref 70–99)
Potassium: 3.1 mEq/L — ABNORMAL LOW (ref 3.5–5.1)
Potassium: 3.9 mEq/L (ref 3.5–5.1)
Sodium: 140 mEq/L (ref 135–145)
Sodium: 139 mEq/L (ref 135–145)
Total Bilirubin: 0.7 mg/dL (ref 0.3–1.2)
Total Protein: 6.6 g/dL (ref 6.0–8.3)
Total Protein: 6.5 g/dL (ref 6.0–8.3)

## 2011-03-18 LAB — CBC
HCT: 40 % (ref 36.0–46.0)
HCT: 37.4 % (ref 36.0–46.0)
Hemoglobin: 13.2 g/dL (ref 12.0–15.0)
MCH: 32.7 pg (ref 26.0–34.0)
MCH: 34 pg (ref 26.0–34.0)
MCHC: 33 g/dL (ref 30.0–36.0)
MCHC: 34 g/dL (ref 30.0–36.0)
MCV: 99 fL (ref 78.0–100.0)
Platelets: 190 10*3/uL (ref 150–400)
RBC: 4.04 MIL/uL (ref 3.87–5.11)
RDW: 11.9 % (ref 11.5–15.5)
RDW: 12 % (ref 11.5–15.5)
WBC: 10.2 10*3/uL (ref 4.0–10.5)

## 2011-03-18 LAB — LIPASE, BLOOD: Lipase: 1626 U/L — ABNORMAL HIGH (ref 11–59)

## 2011-03-18 MED ORDER — FOLIC ACID 1 MG PO TABS
1.0000 mg | ORAL_TABLET | Freq: Every day | ORAL | Status: DC
  Administered 2011-03-18 – 2011-03-19 (×2): 1 mg via ORAL
  Filled 2011-03-18 (×3): qty 1

## 2011-03-18 MED ORDER — LORAZEPAM 2 MG/ML IJ SOLN
0.0000 mg | Freq: Two times a day (BID) | INTRAMUSCULAR | Status: DC
  Administered 2011-03-20: 2 mg via INTRAVENOUS
  Filled 2011-03-18 (×2): qty 1

## 2011-03-18 MED ORDER — MORPHINE SULFATE 2 MG/ML IJ SOLN
4.0000 mg | INTRAMUSCULAR | Status: DC | PRN
  Administered 2011-03-18 – 2011-03-19 (×4): 4 mg via INTRAVENOUS
  Filled 2011-03-18 (×5): qty 2

## 2011-03-18 MED ORDER — VITAMIN B-1 100 MG PO TABS
100.0000 mg | ORAL_TABLET | Freq: Every day | ORAL | Status: DC
  Administered 2011-03-18 – 2011-03-19 (×2): 100 mg via ORAL
  Filled 2011-03-18 (×3): qty 1

## 2011-03-18 MED ORDER — WHITE PETROLATUM GEL
Status: AC
  Filled 2011-03-18: qty 5

## 2011-03-18 MED ORDER — ZOLPIDEM TARTRATE 5 MG PO TABS
5.0000 mg | ORAL_TABLET | Freq: Every evening | ORAL | Status: DC | PRN
  Administered 2011-03-19: 5 mg via ORAL
  Filled 2011-03-18: qty 1

## 2011-03-18 MED ORDER — HYDROMORPHONE HCL PF 1 MG/ML IJ SOLN
1.0000 mg | Freq: Once | INTRAMUSCULAR | Status: AC
  Administered 2011-03-18: 1 mg via INTRAVENOUS
  Filled 2011-03-18: qty 1

## 2011-03-18 MED ORDER — HYDROMORPHONE HCL PF 1 MG/ML IJ SOLN
1.0000 mg | Freq: Once | INTRAMUSCULAR | Status: AC
  Administered 2011-03-18: 1 mg via INTRAVENOUS

## 2011-03-18 MED ORDER — LORAZEPAM 1 MG PO TABS
1.0000 mg | ORAL_TABLET | Freq: Four times a day (QID) | ORAL | Status: DC | PRN
  Administered 2011-03-18: 1 mg via ORAL
  Filled 2011-03-18: qty 1

## 2011-03-18 MED ORDER — ACETAMINOPHEN 650 MG RE SUPP: 650.0000 mg | Freq: Four times a day (QID) | RECTAL | Status: DC | PRN

## 2011-03-18 MED ORDER — PROMETHAZINE HCL 25 MG/ML IJ SOLN
12.5000 mg | INTRAMUSCULAR | Status: AC
  Administered 2011-03-18: 12.5 mg via INTRAVENOUS
  Filled 2011-03-18: qty 1

## 2011-03-18 MED ORDER — POTASSIUM CHLORIDE IN NACL 20-0.9 MEQ/L-% IV SOLN
INTRAVENOUS | Status: DC
  Administered 2011-03-18: 21:00:00 via INTRAVENOUS
  Administered 2011-03-18: 1000 mL via INTRAVENOUS
  Administered 2011-03-19 – 2011-03-24 (×7): via INTRAVENOUS
  Filled 2011-03-18 (×13): qty 1000

## 2011-03-18 MED ORDER — LORAZEPAM 2 MG/ML IJ SOLN
1.0000 mg | Freq: Four times a day (QID) | INTRAMUSCULAR | Status: DC | PRN
  Administered 2011-03-20: 1 mg via INTRAVENOUS

## 2011-03-18 MED ORDER — OXYCODONE HCL 5 MG PO TABS
5.0000 mg | ORAL_TABLET | ORAL | Status: DC | PRN
  Administered 2011-03-18: 5 mg via ORAL
  Filled 2011-03-18 (×2): qty 1

## 2011-03-18 MED ORDER — HYDROMORPHONE HCL PF 1 MG/ML IJ SOLN
INTRAMUSCULAR | Status: AC
  Administered 2011-03-18: 1 mg via INTRAVENOUS
  Filled 2011-03-18: qty 1

## 2011-03-18 MED ORDER — POTASSIUM CHLORIDE 10 MEQ/100ML IV SOLN
10.0000 meq | Freq: Once | INTRAVENOUS | Status: AC
  Administered 2011-03-18: 10 meq via INTRAVENOUS
  Filled 2011-03-18: qty 100

## 2011-03-18 MED ORDER — PROMETHAZINE HCL 25 MG PO TABS: 12.5000 mg | ORAL_TABLET | Freq: Four times a day (QID) | ORAL | Status: DC | PRN

## 2011-03-18 MED ORDER — PROMETHAZINE HCL 25 MG/ML IJ SOLN
12.5000 mg | Freq: Four times a day (QID) | INTRAMUSCULAR | Status: DC | PRN
  Administered 2011-03-19: 12.5 mg via INTRAVENOUS
  Filled 2011-03-18: qty 1

## 2011-03-18 MED ORDER — ONDANSETRON HCL 4 MG/2ML IJ SOLN
INTRAMUSCULAR | Status: AC
  Administered 2011-03-18: 4 mg via INTRAVENOUS
  Filled 2011-03-18: qty 2

## 2011-03-18 MED ORDER — SODIUM CHLORIDE 0.9 % IV BOLUS (SEPSIS)
1000.0000 mL | Freq: Once | INTRAVENOUS | Status: AC
  Administered 2011-03-18: 1000 mL via INTRAVENOUS

## 2011-03-18 MED ORDER — PROCHLORPERAZINE EDISYLATE 5 MG/ML IJ SOLN
10.0000 mg | Freq: Four times a day (QID) | INTRAMUSCULAR | Status: DC | PRN
  Filled 2011-03-18 (×3): qty 2

## 2011-03-18 MED ORDER — M.V.I. ADULT IV INJ
INJECTION | Freq: Once | INTRAVENOUS | Status: AC
  Administered 2011-03-18: 08:00:00 via INTRAVENOUS
  Filled 2011-03-18: qty 1000

## 2011-03-18 MED ORDER — DOCUSATE SODIUM 100 MG PO CAPS
100.0000 mg | ORAL_CAPSULE | Freq: Two times a day (BID) | ORAL | Status: DC
  Administered 2011-03-18 – 2011-03-19 (×3): 100 mg via ORAL
  Filled 2011-03-18 (×3): qty 1

## 2011-03-18 MED ORDER — IOHEXOL 300 MG/ML  SOLN
100.0000 mL | Freq: Once | INTRAMUSCULAR | Status: AC | PRN
  Administered 2011-03-18: 100 mL via INTRAVENOUS

## 2011-03-18 MED ORDER — HEPARIN SODIUM (PORCINE) 5000 UNIT/ML IJ SOLN
5000.0000 [IU] | Freq: Three times a day (TID) | INTRAMUSCULAR | Status: DC
  Administered 2011-03-18 – 2011-03-20 (×7): 5000 [IU] via SUBCUTANEOUS
  Filled 2011-03-18 (×10): qty 1

## 2011-03-18 MED ORDER — SENNA 8.6 MG PO TABS
2.0000 | ORAL_TABLET | Freq: Every day | ORAL | Status: DC | PRN
  Filled 2011-03-18: qty 2

## 2011-03-18 MED ORDER — LORAZEPAM 2 MG/ML IJ SOLN
0.0000 mg | Freq: Four times a day (QID) | INTRAMUSCULAR | Status: AC
  Administered 2011-03-19 (×2): 2 mg via INTRAVENOUS
  Administered 2011-03-20: 1 mg via INTRAVENOUS
  Filled 2011-03-18 (×3): qty 1

## 2011-03-18 MED ORDER — ONDANSETRON HCL 4 MG/2ML IJ SOLN
4.0000 mg | Freq: Four times a day (QID) | INTRAMUSCULAR | Status: DC | PRN
  Administered 2011-03-19 – 2011-03-25 (×4): 4 mg via INTRAVENOUS
  Filled 2011-03-18 (×3): qty 2

## 2011-03-18 MED ORDER — ACETAMINOPHEN 325 MG PO TABS
650.0000 mg | ORAL_TABLET | Freq: Four times a day (QID) | ORAL | Status: DC | PRN
  Administered 2011-03-24: 650 mg via ORAL
  Administered 2011-03-26: 325 mg via ORAL
  Administered 2011-03-26: 650 mg via ORAL
  Filled 2011-03-18: qty 2
  Filled 2011-03-18: qty 1
  Filled 2011-03-18 (×2): qty 2

## 2011-03-18 MED ORDER — THERA M PLUS PO TABS
1.0000 | ORAL_TABLET | Freq: Every day | ORAL | Status: DC
  Administered 2011-03-18 – 2011-03-19 (×2): 1 via ORAL
  Filled 2011-03-18 (×3): qty 1

## 2011-03-18 MED ORDER — ONDANSETRON HCL 4 MG/2ML IJ SOLN
4.0000 mg | Freq: Once | INTRAMUSCULAR | Status: AC
  Administered 2011-03-20: 4 mg via INTRAVENOUS
  Filled 2011-03-18 (×2): qty 2

## 2011-03-18 MED ORDER — HYDROMORPHONE HCL PF 1 MG/ML IJ SOLN
1.0000 mg | INTRAMUSCULAR | Status: DC | PRN
  Administered 2011-03-18 – 2011-03-19 (×5): 1 mg via INTRAVENOUS
  Filled 2011-03-18 (×6): qty 1

## 2011-03-18 MED ORDER — ONDANSETRON HCL 4 MG PO TABS: 4.0000 mg | ORAL_TABLET | Freq: Four times a day (QID) | ORAL | Status: DC | PRN

## 2011-03-18 MED ORDER — ONDANSETRON HCL 4 MG/2ML IJ SOLN
4.0000 mg | Freq: Once | INTRAMUSCULAR | Status: AC
  Administered 2011-03-18: 4 mg via INTRAVENOUS

## 2011-03-18 NOTE — ED Notes (Signed)
Patient is resting comfortably. 

## 2011-03-18 NOTE — ED Notes (Signed)
2L O2 [place] placed on pt.

## 2011-03-18 NOTE — Progress Notes (Signed)
Subjective: Reported that she was feeling better around lunchtime. After I gave her a few tablespoons of Jell-O, I did receive a call back from the nurse saying that the abdominal pain is worse. Now is back NPO.   Physical Exam: Blood pressure 136/71, pulse 76, temperature 98.6 F (37 C), temperature source Oral, resp. rate 20, weight 161.5 kg (356 lb 0.7 oz), SpO2 96.00%. Alert and oriented x3 Non-delirious Calm and cooperative Chest clear to auscultation without wheezes rhonchi crackles Heart regular rate and rhythm without murmurs rubs or gallops Abdomen soft with mild epigastric tenderness - bowel sounds diminished   Investigations:  No results found for this or any previous visit (from the past 240 hour(s)).   Basic Metabolic Panel:  Basename 03/18/11 0952 03/18/11 0030  NA 139 140  K 3.9 3.1*  CL 105 104  CO2 27 23  GLUCOSE 98 105*  BUN 6 9  CREATININE 0.59 0.58  CALCIUM 8.4 8.5  MG -- --  PHOS -- --   Liver Function Tests:  [facility] 03/18/11 0952 03/18/11 0030  AST 81* 45*  ALT 53* 41*  ALKPHOS 88 82  BILITOT 1.2 0.7  PROT 6.5 6.6  ALBUMIN 3.0* 3.1*     CBC:  Basename 03/18/11 0952 03/18/11 0030  WBC 10.1 10.2  NEUTROABS -- --  HGB 12.7 13.2  HCT 37.4 40.0  MCV 100.3* 99.0  PLT 181 190    Ct Abdomen Pelvis W Contrast  03/18/2011  *RADIOLOGY REPORT*  Clinical Data: Mid abdominal pain, with nausea and vomiting.  CT ABDOMEN AND PELVIS WITH CONTRAST  Technique:  Multidetector CT imaging of the abdomen and pelvis was performed following the standard protocol during bolus administration of intravenous contrast.  Contrast: OMNIPAQUE IOHEXOL 300 MG/ML IV SOLN  Comparison: CT of the abdomen and pelvis, and abdominal ultrasound, performed 11/27/2010  Findings: Mild bibasilar atelectasis is noted.  There is marked soft tissue inflammation noted about the head and body of the pancreas, wrapping about the first and second segments of the duodenum, with mild  involvement of the gastric antrum. Multiple rounded fluid collections are identified at the pancreatic head and body, measuring 2.3 cm, 1.7 cm and 0.9 cm in size, likely reflecting small pseudocysts.  There is concern for mild devascularization at the pancreatic head.  There is associated focal wall edema along the second segment of the duodenum, with more diffuse wall thickening noted along the duodenum.  Soft tissue stranding and fluid track laterally and inferiorly along Gerota's fascia on the right side.  The liver and spleen are unremarkable in appearance.  The patient is status post cholecystectomy, with clips noted at the gallbladder fossa.  The common hepatic duct measures 0.9 cm, within normal limits status post cholecystectomy; minimal prominence of the intrahepatic biliary ducts likely also remains within normal limits.  The spleen is unremarkable in appearance.  The adrenal glands are within normal limits.  A few small hypodensities are noted within both kidneys, measuring up to 6 mm in size, likely reflecting small cysts.  Minimal nonspecific perinephric stranding is noted on the left side.  The kidneys are otherwise unremarkable in appearance.  There is no evidence of hydronephrosis.  No renal or ureteral stones are seen.  No free fluid is identified.  The small bowel is unremarkable in appearance.  A small to moderate hiatal hernia is noted, filled with contrast; trace adjacent fluid is nonspecific in appearance. The stomach is filled with contrast and is otherwise grossly unremarkable.  No acute vascular abnormalities are seen.  Scattered calcification is noted along the abdominal aorta and its branches.  The appendix is normal in caliber and filled with air, without evidence for appendicitis.  Minimal diverticulosis is noted at the splenic flexure of the colon, and mild diverticulosis is seen at the proximal sigmoid colon, without evidence of diverticulitis. The colon is otherwise unremarkable in  appearance.  The bladder is mildly distended and grossly unremarkable.  The uterus is within normal limits.  The ovaries are relatively symmetric; no suspicious adnexal masses are seen.  Trace fluid within the pelvis is likely physiologic in nature.  No inguinal lymphadenopathy is seen.  No acute osseous abnormalities are identified.  Vacuum phenomenon and disc space narrowing are noted at L4-L5.  IMPRESSION:  1.  Significant acute pancreatitis noted, involving the head and body of the pancreas. Multiple small pseudocysts noted at the pancreatic head and body, measuring up to 2.3 cm in size.  Likely mild devascularization of the pancreatic head. 2.  Marked associated soft tissue inflammation wraps about the first and second segments of the duodenum, with focal wall edema at the second segment of the duodenum.  Significant soft tissue stranding and fluid track inferiorly and laterally along Gerota's fascia on the right side. 3.  Likely small bilateral renal cysts noted. 4.  Small to moderate hiatal hernia seen; trace nonspecific fluid noted adjacent to the hiatal hernia. 5.  Minimal diverticulosis at the splenic flexure of the colon, and mild diverticulosis of the proximal sigmoid colon, without evidence of diverticulitis. 6.  Mild bibasilar atelectasis.  Findings were discussed with Dr. Raeford Razor at 03:33 a.m. on 03/18/2011.  Original Report Authenticated By: Tonia Ghent, M.D.      Medications: I have reviewed the patient's current medications.  Impression: 55 year old woman with alcohol abuse admitted with moderate pancreatitis. Her radiographic findings are much worse than her clinical findings. Even though the appearance of the CAT scan would suggest severe necrotizing pancreatitis, clinically she does not behave as having such a problem. Clearly she is not ready for liquids yet so we'll continue to hold off on feeding her. We may place a jejunal panda tube in a few days if she continues to be  symptomatic Active Problems:  DEPRESSION  FATTY LIVER DISEASE  Alcohol abuse  Pancreatitis     Plan: Continue bowel rest Continue intravenous fluid Continue symptomatic treatment Watch for delirium tremens      LOS: 1 day   Yoshiye Kraft 03/18/2011, 5:02 PM

## 2011-03-18 NOTE — ED Notes (Signed)
Urine specimen requested 2nd occassion

## 2011-03-18 NOTE — ED Notes (Signed)
Potassium drip completed.

## 2011-03-18 NOTE — ED Notes (Signed)
MD made aware pt in pain.

## 2011-03-18 NOTE — Progress Notes (Signed)
Utilization review complete 

## 2011-03-18 NOTE — H&P (Signed)
PCP:  PCP listed as Carollee Herter, but pt states she saw Shary Decamp to establish  care once earlier this year and hasn't seen him again, doesn't really identify him as  her PCP. She has been admitted to teaching service 11/2010, but [facility] before that.     Chief Complaint:  Abdominal pain   HPI:  55yoF with h/o pancreatitis, alcohol abuse, chronic diarrhea presents with  pancreatitis.    The patient was last admitted 8/9 to 8/11 of this year with epigastric pain that was  similar in nature to pancreatitis she had had in 01/2010. However, lipase/amylase were  negative, and CT scan, RUQ were negative then. GI consultation did an EGD which was  negative except known hiatal hernia. After the EGD was done and she was informed it was  negative, she left AMA. Alcoholic gastritis was suspected.   Per the patient, she had 3-4 drinks on Saturday, and a drink on Monday. She states  she'll drink 3-4 times per week, usually a few drinks of vodka but also some wine with  dinner. She was in rehab for alcohol earlier this year in 09/2010 and was abstinent for  a few months before relapse.   She was doing well and notes Tuesday morning she was feeling well, ate breakfast and  went to work. However, around 4p she started having abdominal/epigastric pain that she  just thought was a stomach ache, for which she took Tylenol without relief. Pain  progressively worsened, for which she sought care.   In the ED vitals were stable. Chemistry panel with K 3.1 otherwise normal including  renal function, LFT's with AST/ALT 45 and 41 o/w normal, lipase 1626, CBC unremarkable.   CTAP with contrast showed "significant acute pancreatitis in the head and body,  multiple small pseudocysts in the head and body up to 2.3 cm in size, with likely mild  devascularization of the head" and also with "marked associated soft tissue  inflammation wraps about the first and second segments of the duodenum, with focal  wall  edema at the second segment of the duodenum.  Significant soft tissue stranding and  fluid track inferiorly and laterally along Gerota's fascia on the right side."  Pt was given 3 rounds of IV Dilaudid, 3 rounds of Zofran, IVF's, and 10 mEq of KCl.    Past Medical History  Diagnosis Date  . GERD (gastroesophageal reflux disease)   . Rosacea   . Elevated liver function tests   . Wears glasses   . Chronic diarrhea   . Pancreatitis 01/2010    2011 admission was second admission, 3rd in 02/2011  . Alcohol abuse     H/o withdrawal     Past Surgical History  Procedure Date  . Cholecystectomy   . Shoulder surgery [id number]  . Diagnostic mammogram 2008  . Tubal ligation   . Colonoscopy Never    Medications:  HOME MEDS: Prior to Admission medications   Not on File    Allergies:  Allergies  Allergen Reactions  . Ciprofloxacin     REACTION: nausea    Social History:   reports that she has quit smoking. She has never used smokeless tobacco. She reports that she drinks about 1.5 - 2 ounces of alcohol per week. She reports that she does not use illicit drugs.  Family History: Family History  Problem Relation Age of Onset  . Stroke Mother   . Heart disease Father   . Heart disease Sister  Physical Exam: Filed Vitals:   03/18/11 0445 03/18/11 0500 03/18/11 0515 03/18/11 0530  BP: 140/87 154/73 150/96 148/88  Pulse: 86 62 78 75  Temp:      TempSrc:      Resp: 26 16 21 10   SpO2: 93% 97% 94% 93%   Blood pressure 148/88, pulse 75, temperature 97.9 F (36.6 C), temperature source Oral, resp. rate 10, SpO2 93.00%.  Gen: Middle aged F, nauseous and vomiting into green bag as I talk to her. Eventually  she settles down and is more comfortable and can relate her story well. No  cardipulmonary distress HEENT: PERRL, EOMI, sclera are a bit muddy but anicteric, mouth moist and normal  appearing.  Lungs: CTAB no w/c/r, other adventitious sounds Heart: RRR no m/g,  benign Abd: Mildly distended with subjective TTP epigastrically and less so in RUQ, negative  Murphy sign, non-peritoneal Extrem: Thin but with normal tone, warm not cool or cyanotic, bilateral radials  palpable Neuro: Alert, conversant, pleasant, moving extremities sponteneously, grossly non-focal    Labs & Imaging Results for orders placed during the hospital encounter of 03/17/11 (from the past 48 hour(s))  CBC     Status: Normal   Collection Time   03/18/11 12:30 AM      Component Value Range Comment   WBC 10.2  4.0 - 10.5 (K/uL)    RBC 4.04  3.87 - 5.11 (MIL/uL)    Hemoglobin 13.2  12.0 - 15.0 (g/dL)    HCT 16.1  09.6 - 04.5 (%)    MCV 99.0  78.0 - 100.0 (fL)    MCH 32.7  26.0 - 34.0 (pg)    MCHC 33.0  30.0 - 36.0 (g/dL)    RDW 40.9  81.1 - 91.4 (%)    Platelets 190  150 - 400 (K/uL)   COMPREHENSIVE METABOLIC PANEL     Status: Abnormal   Collection Time   03/18/11 12:30 AM      Component Value Range Comment   Sodium 140  135 - 145 (mEq/L)    Potassium 3.1 (*) 3.5 - 5.1 (mEq/L)    Chloride 104  96 - 112 (mEq/L)    CO2 23  19 - 32 (mEq/L)    Glucose, Bld 105 (*) 70 - 99 (mg/dL)    BUN 9  6 - 23 (mg/dL)    Creatinine, Ser 7.82  0.50 - 1.10 (mg/dL)    Calcium 8.5  8.4 - 10.5 (mg/dL)    Total Protein 6.6  6.0 - 8.3 (g/dL)    Albumin 3.1 (*) 3.5 - 5.2 (g/dL)    AST 45 (*) 0 - 37 (U/L)    ALT 41 (*) 0 - 35 (U/L)    Alkaline Phosphatase 82  39 - 117 (U/L)    Total Bilirubin 0.7  0.3 - 1.2 (mg/dL)    GFR calc non Af Amer >90  >90 (mL/min)    GFR calc Af Amer >90  >90 (mL/min)   LIPASE, BLOOD     Status: Abnormal   Collection Time   03/18/11 12:30 AM      Component Value Range Comment   Lipase 1626 (*) 11 - 59 (U/L)    Ct Abdomen Pelvis W Contrast  03/18/2011  *RADIOLOGY REPORT*  Clinical Data: Mid abdominal pain, with nausea and vomiting.  CT ABDOMEN AND PELVIS WITH CONTRAST  Technique:  Multidetector CT imaging of the abdomen and pelvis was performed following the  standard protocol during bolus administration of intravenous contrast.  Contrast: OMNIPAQUE IOHEXOL 300 MG/ML IV SOLN  Comparison: CT of the abdomen and pelvis, and abdominal ultrasound, performed 11/27/2010  Findings: Mild bibasilar atelectasis is noted.  There is marked soft tissue inflammation noted about the head and body of the pancreas, wrapping about the first and second segments of the duodenum, with mild involvement of the gastric antrum. Multiple rounded fluid collections are identified at the pancreatic head and body, measuring 2.3 cm, 1.7 cm and 0.9 cm in size, likely reflecting small pseudocysts.  There is concern for mild devascularization at the pancreatic head.  There is associated focal wall edema along the second segment of the duodenum, with more diffuse wall thickening noted along the duodenum.  Soft tissue stranding and fluid track laterally and inferiorly along Gerota's fascia on the right side.  The liver and spleen are unremarkable in appearance.  The patient is status post cholecystectomy, with clips noted at the gallbladder fossa.  The common hepatic duct measures 0.9 cm, within normal limits status post cholecystectomy; minimal prominence of the intrahepatic biliary ducts likely also remains within normal limits.  The spleen is unremarkable in appearance.  The adrenal glands are within normal limits.  A few small hypodensities are noted within both kidneys, measuring up to 6 mm in size, likely reflecting small cysts.  Minimal nonspecific perinephric stranding is noted on the left side.  The kidneys are otherwise unremarkable in appearance.  There is no evidence of hydronephrosis.  No renal or ureteral stones are seen.  No free fluid is identified.  The small bowel is unremarkable in appearance.  A small to moderate hiatal hernia is noted, filled with contrast; trace adjacent fluid is nonspecific in appearance. The stomach is filled with contrast and is otherwise grossly unremarkable.   No acute vascular abnormalities are seen.  Scattered calcification is noted along the abdominal aorta and its branches.  The appendix is normal in caliber and filled with air, without evidence for appendicitis.  Minimal diverticulosis is noted at the splenic flexure of the colon, and mild diverticulosis is seen at the proximal sigmoid colon, without evidence of diverticulitis. The colon is otherwise unremarkable in appearance.  The bladder is mildly distended and grossly unremarkable.  The uterus is within normal limits.  The ovaries are relatively symmetric; no suspicious adnexal masses are seen.  Trace fluid within the pelvis is likely physiologic in nature.  No inguinal lymphadenopathy is seen.  No acute osseous abnormalities are identified.  Vacuum phenomenon and disc space narrowing are noted at L4-L5.  IMPRESSION:  1.  Significant acute pancreatitis noted, involving the head and body of the pancreas. Multiple small pseudocysts noted at the pancreatic head and body, measuring up to 2.3 cm in size.  Likely mild devascularization of the pancreatic head. 2.  Marked associated soft tissue inflammation wraps about the first and second segments of the duodenum, with focal wall edema at the second segment of the duodenum.  Significant soft tissue stranding and fluid track inferiorly and laterally along Gerota's fascia on the right side. 3.  Likely small bilateral renal cysts noted. 4.  Small to moderate hiatal hernia seen; trace nonspecific fluid noted adjacent to the hiatal hernia. 5.  Minimal diverticulosis at the splenic flexure of the colon, and mild diverticulosis of the proximal sigmoid colon, without evidence of diverticulitis. 6.  Mild bibasilar atelectasis.  Findings were discussed with Dr. Raeford Razor at 03:33 a.m. on 03/18/2011.  Original Report Authenticated By: Tonia Ghent, M.D.    Impression Present  on Admission:  .Alcohol abuse .Pancreatitis     55yoF with h/o pancreatitis x2, alcohol  abuse, chronic diarrhea presents with  pancreatitis.    1. Pancreatitis: Likely alcohol abuse. Suspect pt is minimizing her intake. She had CCY  and LFT's are normal, thus I think gallstone etiology is less likely. She has none of  Ranson's criteria (don't know her LDH but others are negative), vitals are stable,  however she does have pseudocysts, evidence of lots of inflammation, and evidence of  "devascularization," thus overall making this a bit more high risk than simple  pancreatitis.    - NPO, IVF's - Pain and nausea control. Pt was nauseous probably due to Dilaudid, switch to  Morphine.  - Would recommend daytime GI consultation given ? desvascularization seen on CT, and  also pseudocysts for risk management to prevent necrosis and worsened pseudocysts.  She'll likely need f/u as outpt with a GI doc  2. Alcohol abuse  - SW and substance abuse consultation  - CIWA scale while admitted - IVF with thiamine, folate, MV, then orally after   3. Pt needs to establish with a PCP.   Regular bed, team 2 Full code, discussed with pt   Other plans as per orders.   Koehn Salehi 03/18/2011, 5:56 AM

## 2011-03-18 NOTE — ED Provider Notes (Signed)
History     CSN: [id number] Arrival date & time: 03/17/2011  9:34 PM   First MD Initiated Contact with Patient 03/17/11 2348      Chief Complaint  Patient presents with  . Abdominal Pain    (Consider location/radiation/quality/duration/timing/severity/associated sxs/prior treatment) Patient is a 55 y.o. female presenting with abdominal pain. The history is provided by the patient.  Abdominal Pain The primary symptoms of the illness include abdominal pain.  Patient relates that she started having a upper abdominal pain at about 4 PM. Pain was severe and radiated through to the back. There is associated nausea and she is vomited once. Pain is described as a dull, achy pain with episodes of severe sharp pain. Pain is 9/10 currently and 10 out of 10 when she has a severe sharp pain. Nothing makes her pain better and nothing makes it worse. She has a history of pancreatitis which was related to alcohol abuse, and she states that this pain is somewhat similar to that. She admits to having 3 drinks 3 days ago and one drink yesterday.  Past Medical History  Diagnosis Date  . GERD (gastroesophageal reflux disease)   . Rosacea   . Elevated liver function tests   . Wears glasses   . Chronic diarrhea   . Pancreatitis 2011    Hospitalization    Past Surgical History  Procedure Date  . Cholecystectomy   . Shoulder surgery [id number]  . Diagnostic mammogram 2008  . Tubal ligation   . Colonoscopy Never    Family History  Problem Relation Age of Onset  . Stroke Mother   . Heart disease Father   . Heart disease Sister     History  Substance Use Topics  . Smoking status: Former Games developer  . Smokeless tobacco: Never Used  . Alcohol Use: Yes     2-10 drinks per week as of 09/01/10    OB History    Grav Para Term Preterm Abortions TAB SAB Ect Mult Living                  Review of Systems  Gastrointestinal: Positive for abdominal pain.  All other systems reviewed and are  negative.    Allergies  Ciprofloxacin  Home Medications  No current outpatient prescriptions on file.  BP 151/83  Pulse 86  Temp(Src) 97.9 F (36.6 C) (Oral)  Resp 19  SpO2 96%  Physical Exam  Nursing note and vitals reviewed.  56 year old female who is resting comfortably and in no acute distress. Vital signs show mild hypertension with blood pressure 151/83. Head is normocephalic and atraumatic. PERRLA, EOMI without any scleral icterus. Oropharynx is clear. Neck is supple without adenopathy or JVD. Lungs are clear without rales, wheezes, or rhonchi. Back is nontender and there is no CVA tenderness. There is no chest wall tenderness. Heart has regular rate and rhythm without murmur. Abdomen is soft, flat, with moderate epigastric tenderness. There is no rebound or guarding. Peristalsis is diminished but present. Extremities have full range of motion, no cyanosis or edema. Skin is warm and moist without rashes. Neurologic: Mental status is normal, cranial nerves are intact, there are no motor or sensory deficits. Psychiatric: No abnormalities of mood or affect.  ED Course  Procedures (including critical care time)  Labs Reviewed  COMPREHENSIVE METABOLIC PANEL - Abnormal; Notable for the following:    Potassium 3.1 (*)    Glucose, Bld 105 (*)    Albumin 3.1 (*)    AST 45 (*)  ALT 41 (*)    All other components within normal limits  LIPASE, BLOOD - Abnormal; Notable for the following:    Lipase 1626 (*)    All other components within normal limits  CBC  URINALYSIS, ROUTINE W REFLEX MICROSCOPIC   Ct Abdomen Pelvis W Contrast  03/18/2011  *RADIOLOGY REPORT*  Clinical Data: Mid abdominal pain, with nausea and vomiting.  CT ABDOMEN AND PELVIS WITH CONTRAST  Technique:  Multidetector CT imaging of the abdomen and pelvis was performed following the standard protocol during bolus administration of intravenous contrast.  Contrast: OMNIPAQUE IOHEXOL 300 MG/ML IV SOLN  Comparison:  CT of the abdomen and pelvis, and abdominal ultrasound, performed 11/27/2010  Findings: Mild bibasilar atelectasis is noted.  There is marked soft tissue inflammation noted about the head and body of the pancreas, wrapping about the first and second segments of the duodenum, with mild involvement of the gastric antrum. Multiple rounded fluid collections are identified at the pancreatic head and body, measuring 2.3 cm, 1.7 cm and 0.9 cm in size, likely reflecting small pseudocysts.  There is concern for mild devascularization at the pancreatic head.  There is associated focal wall edema along the second segment of the duodenum, with more diffuse wall thickening noted along the duodenum.  Soft tissue stranding and fluid track laterally and inferiorly along Gerota's fascia on the right side.  The liver and spleen are unremarkable in appearance.  The patient is status post cholecystectomy, with clips noted at the gallbladder fossa.  The common hepatic duct measures 0.9 cm, within normal limits status post cholecystectomy; minimal prominence of the intrahepatic biliary ducts likely also remains within normal limits.  The spleen is unremarkable in appearance.  The adrenal glands are within normal limits.  A few small hypodensities are noted within both kidneys, measuring up to 6 mm in size, likely reflecting small cysts.  Minimal nonspecific perinephric stranding is noted on the left side.  The kidneys are otherwise unremarkable in appearance.  There is no evidence of hydronephrosis.  No renal or ureteral stones are seen.  No free fluid is identified.  The small bowel is unremarkable in appearance.  A small to moderate hiatal hernia is noted, filled with contrast; trace adjacent fluid is nonspecific in appearance. The stomach is filled with contrast and is otherwise grossly unremarkable.  No acute vascular abnormalities are seen.  Scattered calcification is noted along the abdominal aorta and its branches.  The appendix  is normal in caliber and filled with air, without evidence for appendicitis.  Minimal diverticulosis is noted at the splenic flexure of the colon, and mild diverticulosis is seen at the proximal sigmoid colon, without evidence of diverticulitis. The colon is otherwise unremarkable in appearance.  The bladder is mildly distended and grossly unremarkable.  The uterus is within normal limits.  The ovaries are relatively symmetric; no suspicious adnexal masses are seen.  Trace fluid within the pelvis is likely physiologic in nature.  No inguinal lymphadenopathy is seen.  No acute osseous abnormalities are identified.  Vacuum phenomenon and disc space narrowing are noted at L4-L5.  IMPRESSION:  1.  Significant acute pancreatitis noted, involving the head and body of the pancreas. Multiple small pseudocysts noted at the pancreatic head and body, measuring up to 2.3 cm in size.  Likely mild devascularization of the pancreatic head. 2.  Marked associated soft tissue inflammation wraps about the first and second segments of the duodenum, with focal wall edema at the second segment of the  duodenum.  Significant soft tissue stranding and fluid track inferiorly and laterally along Gerota's fascia on the right side. 3.  Likely small bilateral renal cysts noted. 4.  Small to moderate hiatal hernia seen; trace nonspecific fluid noted adjacent to the hiatal hernia. 5.  Minimal diverticulosis at the splenic flexure of the colon, and mild diverticulosis of the proximal sigmoid colon, without evidence of diverticulitis. 6.  Mild bibasilar atelectasis.  Findings were discussed with Dr. Raeford Razor at 03:33 a.m. on 03/18/2011.  Original Report Authenticated By: Tonia Ghent, M.D.     No diagnosis found.  She's given IV fluids, IV Dilaudid, IV Zofran and IV potassium. She is feeling somewhat better at this point. Consultation has been obtained with [facility] hospitalists who agreed to admit the patient.  MDM  Pancreatitis most  likely secondary to alcohol abuse. Mild elevation of transaminases also consistent with alcohol abuse.        Dione Booze, MD 03/18/11 [phone]

## 2011-03-19 ENCOUNTER — Inpatient Hospital Stay (HOSPITAL_COMMUNITY): Payer: Self-pay

## 2011-03-19 LAB — BASIC METABOLIC PANEL
BUN: 6 mg/dL (ref 6–23)
Calcium: 8.5 mg/dL (ref 8.4–10.5)
GFR calc Af Amer: >90 mL/min (ref >90)
GFR calc non Af Amer: >90 mL/min (ref >90)
Potassium: 3.5 mEq/L (ref 3.5–5.1)

## 2011-03-19 LAB — GLUCOSE, CAPILLARY: Glucose-Capillary: 95 mg/dL (ref 70–99)

## 2011-03-19 LAB — CBC
Hemoglobin: 12.4 g/dL (ref 12.0–15.0)
MCHC: 32 g/dL (ref 30.0–36.0)
Platelets: 177 10*3/uL (ref 150–400)
RDW: 12.1 % (ref 11.5–15.5)

## 2011-03-19 MED ORDER — HYDROMORPHONE HCL PF 1 MG/ML IJ SOLN
1.0000 mg | INTRAMUSCULAR | Status: DC | PRN
  Administered 2011-03-19 – 2011-03-20 (×4): 1 mg via INTRAVENOUS
  Filled 2011-03-19 (×4): qty 1

## 2011-03-19 MED ORDER — SODIUM CHLORIDE 0.9 % IV BOLUS (SEPSIS): 500.0000 mL | Freq: Once | INTRAVENOUS | Status: DC

## 2011-03-19 MED ORDER — LORAZEPAM 2 MG/ML IJ SOLN: 1.0000 mg | Freq: Four times a day (QID) | INTRAMUSCULAR | Status: DC

## 2011-03-19 MED ORDER — SODIUM CHLORIDE 0.9 % IV SOLN
500.0000 mg | Freq: Three times a day (TID) | INTRAVENOUS | Status: DC
  Administered 2011-03-19 – 2011-03-23 (×11): 500 mg via INTRAVENOUS
  Filled 2011-03-19 (×14): qty 500

## 2011-03-19 NOTE — Progress Notes (Signed)
Subjective: Starting to get agitated. Says that nausea and abdominal pain is the same.    Physical Exam: Blood pressure 129/80, pulse 103, temperature 98.3 F (36.8 C), temperature source Oral, resp. rate 20, weight 73.256 kg (161 lb 8 oz), SpO2 93.00%. Alert  Mild agitation, easily reoriented and cooperative Chest clear to auscultation without wheezes rhonchi crackles Heart regular rate and rhythm without murmurs rubs or gallops Abdomen soft with mild epigastric tenderness - bowel sounds diminished   Investigations:  No results found for this or any previous visit (from the past 240 hour(s)).   Basic Metabolic Panel:  Basename 03/19/11 0540 03/18/11 0952  NA 134* 139  K 3.5 3.9  CL 99 105  CO2 26 27  GLUCOSE 92 98  BUN 6 6  CREATININE 0.64 0.59  CALCIUM 8.5 8.4  MG -- --  PHOS -- --   Liver Function Tests:  [facility] 03/18/11 0952 03/18/11 0030  AST 81* 45*  ALT 53* 41*  ALKPHOS 88 82  BILITOT 1.2 0.7  PROT 6.5 6.6  ALBUMIN 3.0* 3.1*     CBC:  Basename 03/19/11 0540 03/18/11 0952  WBC 11.0* 10.1  NEUTROABS -- --  HGB 12.4 12.7  HCT 38.7 37.4  MCV 103.5* 100.3*  PLT 177 181    Ct Abdomen Pelvis W Contrast  03/18/2011  *RADIOLOGY REPORT*  Clinical Data: Mid abdominal pain, with nausea and vomiting.  CT ABDOMEN AND PELVIS WITH CONTRAST  Technique:  Multidetector CT imaging of the abdomen and pelvis was performed following the standard protocol during bolus administration of intravenous contrast.  Contrast: OMNIPAQUE IOHEXOL 300 MG/ML IV SOLN  Comparison: CT of the abdomen and pelvis, and abdominal ultrasound, performed 11/27/2010  Findings: Mild bibasilar atelectasis is noted.  There is marked soft tissue inflammation noted about the head and body of the pancreas, wrapping about the first and second segments of the duodenum, with mild involvement of the gastric antrum. Multiple rounded fluid collections are identified at the pancreatic head and body,  measuring 2.3 cm, 1.7 cm and 0.9 cm in size, likely reflecting small pseudocysts.  There is concern for mild devascularization at the pancreatic head.  There is associated focal wall edema along the second segment of the duodenum, with more diffuse wall thickening noted along the duodenum.  Soft tissue stranding and fluid track laterally and inferiorly along Gerota's fascia on the right side.  The liver and spleen are unremarkable in appearance.  The patient is status post cholecystectomy, with clips noted at the gallbladder fossa.  The common hepatic duct measures 0.9 cm, within normal limits status post cholecystectomy; minimal prominence of the intrahepatic biliary ducts likely also remains within normal limits.  The spleen is unremarkable in appearance.  The adrenal glands are within normal limits.  A few small hypodensities are noted within both kidneys, measuring up to 6 mm in size, likely reflecting small cysts.  Minimal nonspecific perinephric stranding is noted on the left side.  The kidneys are otherwise unremarkable in appearance.  There is no evidence of hydronephrosis.  No renal or ureteral stones are seen.  No free fluid is identified.  The small bowel is unremarkable in appearance.  A small to moderate hiatal hernia is noted, filled with contrast; trace adjacent fluid is nonspecific in appearance. The stomach is filled with contrast and is otherwise grossly unremarkable.  No acute vascular abnormalities are seen.  Scattered calcification is noted along the abdominal aorta and its branches.  The appendix is normal  in caliber and filled with air, without evidence for appendicitis.  Minimal diverticulosis is noted at the splenic flexure of the colon, and mild diverticulosis is seen at the proximal sigmoid colon, without evidence of diverticulitis. The colon is otherwise unremarkable in appearance.  The bladder is mildly distended and grossly unremarkable.  The uterus is within normal limits.  The ovaries  are relatively symmetric; no suspicious adnexal masses are seen.  Trace fluid within the pelvis is likely physiologic in nature.  No inguinal lymphadenopathy is seen.  No acute osseous abnormalities are identified.  Vacuum phenomenon and disc space narrowing are noted at L4-L5.  IMPRESSION:  1.  Significant acute pancreatitis noted, involving the head and body of the pancreas. Multiple small pseudocysts noted at the pancreatic head and body, measuring up to 2.3 cm in size.  Likely mild devascularization of the pancreatic head. 2.  Marked associated soft tissue inflammation wraps about the first and second segments of the duodenum, with focal wall edema at the second segment of the duodenum.  Significant soft tissue stranding and fluid track inferiorly and laterally along Gerota's fascia on the right side. 3.  Likely small bilateral renal cysts noted. 4.  Small to moderate hiatal hernia seen; trace nonspecific fluid noted adjacent to the hiatal hernia. 5.  Minimal diverticulosis at the splenic flexure of the colon, and mild diverticulosis of the proximal sigmoid colon, without evidence of diverticulitis. 6.  Mild bibasilar atelectasis.  Findings were discussed with Dr. Raeford Razor at 03:33 a.m. on 03/18/2011.  Original Report Authenticated By: Tonia Ghent, M.D.      Medications: I have reviewed the patient's current medications.  Impression: 55 year old woman with alcohol abuse admitted with moderate pancreatitis. Her radiographic findings are much worse than her clinical findings. Even though the appearance of the CAT scan would suggest severe necrotizing pancreatitis, clinically she does not behave as having such a problem. Clearly she is not ready for liquids yet so we'll continue to hold off on feeding her. We may place a jejunal panda tube in a few days if she continues to be symptomatic. Starting to develop alcohol withdrawal delirium. Active Problems:  DEPRESSION  FATTY LIVER DISEASE  Alcohol  abuse  Pancreatitis     Plan: Continue bowel rest Continue intravenous fluid Continue symptomatic treatment Watch for delirium tremens Schedule ativan      LOS: 2 days   [name] 03/19/2011, 2:19 PM

## 2011-03-19 NOTE — Progress Notes (Addendum)
03/19/11 1800 called to the room because the patient stated she wanted to  See the charge nurse because she did not feel like she was getting enough pain medicine.  When approached patient could not finish a sentence without falling back asleep,  She did however tell me that she did not get her pain medicine - when actually she just received it.  When i told her that she had just received her medicine she fell back to sleep.  At that time helped straighten out her bed so she looked more comfortable and left her sleeping comfortably.

## 2011-03-20 ENCOUNTER — Other Ambulatory Visit: Payer: Self-pay

## 2011-03-20 ENCOUNTER — Inpatient Hospital Stay (HOSPITAL_COMMUNITY): Payer: Self-pay

## 2011-03-20 DIAGNOSIS — R578 Other shock: Secondary | ICD-10-CM

## 2011-03-20 DIAGNOSIS — F10231 Alcohol dependence with withdrawal delirium: Secondary | ICD-10-CM | POA: Diagnosis not present

## 2011-03-20 DIAGNOSIS — K2901 Acute gastritis with bleeding: Secondary | ICD-10-CM

## 2011-03-20 DIAGNOSIS — K863 Pseudocyst of pancreas: Secondary | ICD-10-CM

## 2011-03-20 DIAGNOSIS — K862 Cyst of pancreas: Secondary | ICD-10-CM

## 2011-03-20 DIAGNOSIS — I509 Heart failure, unspecified: Secondary | ICD-10-CM

## 2011-03-20 DIAGNOSIS — K859 Acute pancreatitis without necrosis or infection, unspecified: Secondary | ICD-10-CM

## 2011-03-20 DIAGNOSIS — D62 Acute posthemorrhagic anemia: Secondary | ICD-10-CM

## 2011-03-20 DIAGNOSIS — K922 Gastrointestinal hemorrhage, unspecified: Secondary | ICD-10-CM | POA: Diagnosis not present

## 2011-03-20 DIAGNOSIS — K920 Hematemesis: Secondary | ICD-10-CM

## 2011-03-20 LAB — GLUCOSE, CAPILLARY

## 2011-03-20 LAB — COMPREHENSIVE METABOLIC PANEL
ALT: 35 U/L (ref 0–35)
Alkaline Phosphatase: 113 U/L (ref 39–117)
BUN: 7 mg/dL (ref 6–23)
CO2: 27 mEq/L (ref 19–32)
Calcium: 8.9 mg/dL (ref 8.4–10.5)
GFR calc Af Amer: >90 mL/min (ref >90)
GFR calc non Af Amer: >90 mL/min (ref >90)
Glucose, Bld: 161 mg/dL — ABNORMAL HIGH (ref 70–99)
Sodium: 136 mEq/L (ref 135–145)

## 2011-03-20 LAB — CBC
HCT: 31.8 % — ABNORMAL LOW (ref 36.0–46.0)
HCT: 28.3 % — ABNORMAL LOW (ref 36.0–46.0)
HCT: 23.8 % — ABNORMAL LOW (ref 36.0–46.0)
Hemoglobin: 13.6 g/dL (ref 12.0–15.0)
Hemoglobin: 8.1 g/dL — ABNORMAL LOW (ref 12.0–15.0)
MCH: 34.2 pg — ABNORMAL HIGH (ref 26.0–34.0)
MCHC: 34.3 g/dL (ref 30.0–36.0)
MCV: 100.3 fL — ABNORMAL HIGH (ref 78.0–100.0)
Platelets: 185 10*3/uL (ref 150–400)
Platelets: 190 10*3/uL (ref 150–400)
RBC: 3.93 MIL/uL (ref 3.87–5.11)
RBC: 2.37 MIL/uL — ABNORMAL LOW (ref 3.87–5.11)
RDW: 11.7 % (ref 11.5–15.5)
RDW: 11.8 % (ref 11.5–15.5)
WBC: 17.8 10*3/uL — ABNORMAL HIGH (ref 4.0–10.5)
WBC: 19 10*3/uL — ABNORMAL HIGH (ref 4.0–10.5)

## 2011-03-20 LAB — BASIC METABOLIC PANEL
Calcium: 9.1 mg/dL (ref 8.4–10.5)
GFR calc Af Amer: >90 mL/min (ref >90)
GFR calc non Af Amer: >90 mL/min (ref >90)
Glucose, Bld: 119 mg/dL — ABNORMAL HIGH (ref 70–99)
Potassium: 3.7 mEq/L (ref 3.5–5.1)
Sodium: 137 mEq/L (ref 135–145)

## 2011-03-20 LAB — CARDIAC PANEL(CRET KIN+CKTOT+MB+TROPI)
Relative Index: INVALID (ref 0.0–2.5)
Relative Index: INVALID (ref 0.0–2.5)
Total CK: 25 U/L (ref 7–177)
Troponin I: <0.3 ng/mL (ref <0.30)

## 2011-03-20 LAB — POCT I-STAT 3, ART BLOOD GAS (G3+)
TCO2: 26 mmol/L (ref 0–100)
pCO2 arterial: 33.9 mmHg — ABNORMAL LOW (ref 35.0–45.0)
pH, Arterial: 7.47 — ABNORMAL HIGH (ref 7.350–7.400)

## 2011-03-20 LAB — LIPASE, BLOOD: Lipase: 172 U/L — ABNORMAL HIGH (ref 11–59)

## 2011-03-20 LAB — LACTIC ACID, PLASMA: Lactic Acid, Venous: 1.1 mmol/L (ref 0.5–2.2)

## 2011-03-20 LAB — PROCALCITONIN: Procalcitonin: 0.6 ng/mL

## 2011-03-20 MED ORDER — SODIUM CHLORIDE 0.9 % IV SOLN
8.0000 mg/h | INTRAVENOUS | Status: DC
  Administered 2011-03-20 – 2011-03-23 (×5): 8 mg/h via INTRAVENOUS
  Filled 2011-03-20 (×14): qty 80

## 2011-03-20 MED ORDER — SODIUM CHLORIDE 0.9 % IV BOLUS (SEPSIS)
500.0000 mL | Freq: Once | INTRAVENOUS | Status: AC
  Administered 2011-03-20: 500 mL via INTRAVENOUS

## 2011-03-20 MED ORDER — VANCOMYCIN HCL 1000 MG IV SOLR
750.0000 mg | Freq: Three times a day (TID) | INTRAVENOUS | Status: DC
  Administered 2011-03-21 – 2011-03-23 (×8): 750 mg via INTRAVENOUS
  Filled 2011-03-20 (×9): qty 750

## 2011-03-20 MED ORDER — LORAZEPAM 2 MG/ML IJ SOLN
1.0000 mg | Freq: Two times a day (BID) | INTRAMUSCULAR | Status: DC
  Administered 2011-03-20 – 2011-03-22 (×5): 1 mg via INTRAVENOUS
  Administered 2011-03-23: 0.5 mg via INTRAVENOUS
  Filled 2011-03-20 (×6): qty 1

## 2011-03-20 MED ORDER — HALOPERIDOL LACTATE 5 MG/ML IJ SOLN
1.0000 mg | INTRAMUSCULAR | Status: DC | PRN
  Administered 2011-03-21: 2 mg via INTRAVENOUS
  Administered 2011-03-21: 2.5 mg via INTRAVENOUS
  Administered 2011-03-23: 4 mg via INTRAVENOUS
  Filled 2011-03-20 (×3): qty 1

## 2011-03-20 MED ORDER — ONDANSETRON HCL 4 MG/2ML IJ SOLN
INTRAMUSCULAR | Status: AC
  Filled 2011-03-20: qty 2

## 2011-03-20 MED ORDER — SODIUM CHLORIDE 0.9 % IV SOLN: 8.0000 mg/h | INTRAVENOUS | Status: DC

## 2011-03-20 MED ORDER — SODIUM CHLORIDE 0.9 % IV SOLN: 1.0000 mg | Freq: Once | INTRAVENOUS | Status: DC

## 2011-03-20 MED ORDER — SODIUM CHLORIDE 0.9 % IJ SOLN
INTRAMUSCULAR | Status: AC
  Administered 2011-03-20: 18:00:00
  Filled 2011-03-20: qty 40

## 2011-03-20 MED ORDER — PANTOPRAZOLE SODIUM 40 MG IV SOLR
40.0000 mg | Freq: Two times a day (BID) | INTRAVENOUS | Status: DC
  Administered 2011-03-20: 40 mg via INTRAVENOUS
  Filled 2011-03-20 (×3): qty 40

## 2011-03-20 MED ORDER — FOLIC ACID 5 MG/ML IJ SOLN
1.0000 mg | Freq: Once | INTRAMUSCULAR | Status: AC
  Administered 2011-03-20: 1 mg via INTRAVENOUS
  Filled 2011-03-20 (×2): qty 0.2

## 2011-03-20 MED ORDER — VANCOMYCIN HCL IN DEXTROSE 1-5 GM/200ML-% IV SOLN
1000.0000 mg | Freq: Two times a day (BID) | INTRAVENOUS | Status: AC
  Administered 2011-03-20: 1000 mg via INTRAVENOUS
  Filled 2011-03-20 (×2): qty 200

## 2011-03-20 MED ORDER — THIAMINE HCL 100 MG/ML IJ SOLN
100.0000 mg | Freq: Every day | INTRAMUSCULAR | Status: DC
  Administered 2011-03-20 – 2011-03-24 (×5): 100 mg via INTRAVENOUS
  Filled 2011-03-20 (×6): qty 1

## 2011-03-20 MED ORDER — M.V.I. ADULT IV INJ
10.0000 mL | Freq: Once | INTRAVENOUS | Status: AC
  Administered 2011-03-20: 10 mL via INTRAVENOUS
  Filled 2011-03-20: qty 10

## 2011-03-20 MED ORDER — SODIUM CHLORIDE 0.9 % IJ SOLN
INTRAMUSCULAR | Status: AC
  Administered 2011-03-20: 18:00:00
  Filled 2011-03-20: qty 30

## 2011-03-20 NOTE — Progress Notes (Signed)
eLink Physician-Brief Progress Note Patient Name: [name] DOB: 1955-04-30 MRN: [id number]  Date of Service  03/20/2011   HPI/Events of Note  Called by RN re: agitated delirium. Pt pulled NGT out. It was not effectively removing the blood clots in her stomach anyway.   eICU Interventions  May leave NGT out for now. PRN Haldol for agitated delirium    Billy Fischer 03/20/2011, 11:55 PM

## 2011-03-20 NOTE — Consult Note (Signed)
ANTIBIOTIC CONSULT NOTE - INITIAL  Pharmacy Consult for Vancomycin Indication: pneumonia    Allergies  Allergen Reactions  . Ciprofloxacin     REACTION: nausea    Patient Measurements: Height: 5\' 6"  (167.6 cm) Weight: 171 lb 8.3 oz (77.8 kg) IBW/kg (Calculated) : 59.3    Vital Signs: Temp: 99.8 F (37.7 C) (11/30 1400) Temp src: Oral (11/30 1400) BP: 152/93 mmHg (11/30 1400) Pulse Rate: 102  (11/30 1500) Intake/Output from previous day: 11/29 0701 - 11/30 0700 In: 1312.5 [P.O.:240; I.V.:872.5; IV Piggyback:200] Out: -  Intake/Output from this shift:    Labs:  Basename 03/20/11 1415 03/20/11 1111 03/20/11 0706 03/19/11 0540  WBC 19.4* -- 17.8* 11.0*  HGB 10.9* -- 13.6 12.4  PLT 195 -- 185 177  LABCREA -- -- -- --  CREATININE -- 0.56 0.58 0.64   Estimated Creatinine Clearance: 82.7 ml/min (by C-G formula based on Cr of 0.56).  Microbiology: No results found for this or any previous visit (from the past 720 hour(s)).  Medical History: Past Medical History  Diagnosis Date  . GERD (gastroesophageal reflux disease)   . Rosacea   . Elevated liver function tests   . Wears glasses   . Chronic diarrhea   . Pancreatitis 01/2010    2011 admission was second admission, 3rd in 02/2011  . Alcohol abuse     H/o withdrawal     Medications:  Scheduled:    . folic acid  1 mg Intravenous Once  . imipenem-cilastatin  500 mg Intravenous Q8H  . LORazepam  0-4 mg Intravenous Q6H  . LORazepam  1 mg Intravenous BID  . multivitamin (MVI) IV infusion (LR or D5LR)  10 mL Intravenous Once  . ondansetron      . ondansetron (ZOFRAN) IV  4 mg Intravenous Once  . sodium chloride  500 mL Intravenous Once  . sodium chloride  500 mL Intravenous Once  . sodium chloride      . sodium chloride      . thiamine  100 mg Intravenous Daily  . vancomycin  1,000 mg Intravenous Q12H  . DISCONTD: folic acid  1 mg Oral Daily  . DISCONTD: folic acid (FOLVITE) IVPB  1 mg Intravenous Once  .  DISCONTD: heparin  5,000 Units Subcutaneous Q8H  . DISCONTD: LORazepam  0-4 mg Intravenous Q12H  . DISCONTD: LORazepam  1 mg Intravenous Q6H  . DISCONTD: multivitamins ther. w/minerals  1 tablet Oral Daily  . DISCONTD: pantoprazole (PROTONIX) IV  40 mg Intravenous Q12H  . DISCONTD: thiamine  100 mg Oral Daily   Anti-infectives     Start     Dose/Rate Route Frequency Ordered Stop   03/20/11 1700   vancomycin (VANCOCIN) IVPB 1000 mg/200 mL premix     Comments: STAT per pharamcy      1,000 mg 200 mL/hr over 60 Minutes Intravenous Every 12 hours 03/20/11 1620     03/19/11 2200   imipenem-cilastatin (PRIMAXIN) 500 mg in sodium chloride 0.9 % 100 mL IVPB        500 mg 200 mL/hr over 30 Minutes Intravenous 3 times per day 03/19/11 1829           Assessment: Patient with fever spike and elevation of WBC.  Concern with pneumonia. Currently on Primaxin. Cultures pending. Vancomycin to be added for enterococcus.  Goal of Therapy:  Vancomycin trough level 15-20 mcg/ml  Plan:  Vancomycin 1gm IV x 1, then Vancomycin 750mg  IV q 8 hours. Follow-up Vancomycin levels at  steady-state. Follow-up cultures/sensitivities.  Pernie Grosso, Elisha Headland, Pharm.D. 03/20/2011 4:37 PM

## 2011-03-20 NOTE — Procedures (Signed)
Central Venous Catheter Insertion Procedure Note [name] [id number] 12-23-1955  Procedure: Insertion of Central Venous Catheter Indications: Drug and/or fluid administration  Procedure Details Consent: Risks of procedure as well as the alternatives and risks of each were explained to the (patient/caregiver).  Consent for procedure obtained. Time Out: Verified patient identification, verified procedure, site/side was marked, verified correct patient position, special equipment/implants available, medications/allergies/relevent history reviewed, required imaging and test results available.  Performed  Maximum sterile technique was used including antiseptics, cap, gloves, gown, hand hygiene, mask and sheet. Skin prep: Chlorhexidine; local anesthetic administered A antimicrobial bonded/coated triple lumen catheter was placed in the right internal jugular vein using the Seldinger technique.  Evaluation Blood flow good Complications: No apparent complications Patient did tolerate procedure well. Chest X-ray ordered to verify placement.  CXR: pending.  [name] 03/20/2011, 5:06 PM  Emergent need Active bleeding, ABX, cvp?  Mcarthur Rossetti. Tyson Alias, MD, FACP Pgr: [phone]  Pulmonary & Critical Care

## 2011-03-20 NOTE — Consults (Signed)
Follow up - Critical Care Medicine Note  Patient Details:    [name] is an 55 y.o. 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  She is also noted to have a PMH of GERD, Rosacea, and elevated LFTs.  She was presented to the ED with abdominal pain and was subsequently admitted for acute exacerbation of pancreatitis on 03/18/11.  Earlier today, she has 2 episodes of coffee ground emesis.  She also has some agitation, complained of left side CP and epigastric pain.  Per internal medicine assessment she was also delirious.  Her care was transferred to [facility]. Acute increased GI bleeding.  Lines, Airways, Drains: 11/30 Left arm peripheral lines x 2 >>> 11/30 NG tube >>>  Anti-infectives:  11/29 Primaxin >>> 11/30 vanc>>>  Other Medications: Ativan Zofran Protonix Thiamin vitamin Compazine Promethazine  Anti-infectives     Start     Dose/Rate Route Frequency Ordered Stop   03/19/11 2200   imipenem-cilastatin (PRIMAXIN) 500 mg in sodium chloride 0.9 % 100 mL IVPB        500 mg 200 mL/hr over 30 Minutes Intravenous 3 times per day 03/19/11 1829            Microbiology: 08/09 MRSA screening >>> Negative 11/29 Blood culture >>> Pending 11/29 Urine culture >>> Pending  Results for orders placed during the hospital encounter of 11/27/10  MRSA PCR SCREENING     Status: Normal   Collection Time   11/27/10  8:46 PM      Component Value Range Status Comment   MRSA by PCR NEGATIVE  NEGATIVE  Final   CULTURE, BLOOD (ROUTINE X 2)     Status: Normal   Collection Time   11/29/10  1:18 AM      Component Value Range Status Comment   Specimen Description BLOOD RIGHT ARM   Final    Special Requests BOTTLES DRAWN AEROBIC AND ANAEROBIC 10CC EACH   Final    Setup Time [id number]   Final    Culture NO GROWTH 5 DAYS   Final    Report Status 12/05/2010 FINAL   Final   CULTURE, BLOOD (ROUTINE X 2)     Status: Normal   Collection Time   11/29/10  1:26 AM      Component Value Range Status Comment   Specimen Description BLOOD RIGHT FOREARM   Final    Special Requests BOTTLES DRAWN AEROBIC AND ANAEROBIC 10CC EACH   Final    Setup Time [id number]   Final    Culture NO GROWTH 5 DAYS   Final    Report Status 12/05/2010 FINAL   Final     Best Practice/Protocols:  GI prophylaxis:  Protonix 03/18/11 >>>  Events: 11/30 Increase delirium, left side chest pain, and coffee ground emesis in AM  Large amount maroon emesis around 3PM  Studies: Ct Abdomen Pelvis W Contrast  03/18/2011  *RADIOLOGY REPORT*  Clinical Data: Mid abdominal pain, with nausea and vomiting.  CT ABDOMEN AND PELVIS WITH CONTRAST  Technique:  Multidetector CT imaging of the abdomen and pelvis was performed following the standard protocol during bolus administration of intravenous contrast.  Contrast: OMNIPAQUE IOHEXOL 300 MG/ML IV SOLN  Comparison: CT of the abdomen and pelvis, and abdominal ultrasound, performed 11/27/2010  Findings: Mild bibasilar atelectasis is noted.  There is marked soft tissue inflammation noted about the head and body of the pancreas, wrapping about the first and second segments of the  duodenum, with mild involvement of the gastric antrum. Multiple rounded fluid collections are identified at the pancreatic head and body, measuring 2.3 cm, 1.7 cm and 0.9 cm in size, likely reflecting small pseudocysts.  There is concern for mild devascularization at the pancreatic head.  There is associated focal wall edema along the second segment of the duodenum, with more diffuse wall thickening noted along the duodenum.  Soft tissue stranding and fluid track laterally and inferiorly along Gerota's fascia on the right side.  The liver and spleen are unremarkable in appearance.  The patient is status post cholecystectomy, with clips noted at the gallbladder fossa.  The common hepatic duct measures 0.9 cm, within normal limits status post  cholecystectomy; minimal prominence of the intrahepatic biliary ducts likely also remains within normal limits.  The spleen is unremarkable in appearance.  The adrenal glands are within normal limits.  A few small hypodensities are noted within both kidneys, measuring up to 6 mm in size, likely reflecting small cysts.  Minimal nonspecific perinephric stranding is noted on the left side.  The kidneys are otherwise unremarkable in appearance.  There is no evidence of hydronephrosis.  No renal or ureteral stones are seen.  No free fluid is identified.  The small bowel is unremarkable in appearance.  A small to moderate hiatal hernia is noted, filled with contrast; trace adjacent fluid is nonspecific in appearance. The stomach is filled with contrast and is otherwise grossly unremarkable.  No acute vascular abnormalities are seen.  Scattered calcification is noted along the abdominal aorta and its branches.  The appendix is normal in caliber and filled with air, without evidence for appendicitis.  Minimal diverticulosis is noted at the splenic flexure of the colon, and mild diverticulosis is seen at the proximal sigmoid colon, without evidence of diverticulitis. The colon is otherwise unremarkable in appearance.  The bladder is mildly distended and grossly unremarkable.  The uterus is within normal limits.  The ovaries are relatively symmetric; no suspicious adnexal masses are seen.  Trace fluid within the pelvis is likely physiologic in nature.  No inguinal lymphadenopathy is seen.  No acute osseous abnormalities are identified.  Vacuum phenomenon and disc space narrowing are noted at L4-L5.  IMPRESSION:  1.  Significant acute pancreatitis noted, involving the head and body of the pancreas. Multiple small pseudocysts noted at the pancreatic head and body, measuring up to 2.3 cm in size.  Likely mild devascularization of the pancreatic head. 2.  Marked associated soft tissue inflammation wraps about the first and second  segments of the duodenum, with focal wall edema at the second segment of the duodenum.  Significant soft tissue stranding and fluid track inferiorly and laterally along Gerota's fascia on the right side. 3.  Likely small bilateral renal cysts noted. 4.  Small to moderate hiatal hernia seen; trace nonspecific fluid noted adjacent to the hiatal hernia. 5.  Minimal diverticulosis at the splenic flexure of the colon, and mild diverticulosis of the proximal sigmoid colon, without evidence of diverticulitis. 6.  Mild bibasilar atelectasis.  Findings were discussed with Dr. Raeford Razor at 03:33 a.m. on 03/18/2011.  Original Report Authenticated By: Tonia Ghent, M.D.   Dg Chest Port 1 View  03/20/2011  *RADIOLOGY REPORT*  Clinical Data: Chest pain  PORTABLE CHEST - 1 VIEW  Comparison: Portable exam 1139 hours compared to 03/19/2011  Findings: Enlargement of cardiac silhouette. Mild pulmonary vascular congestion. Mediastinal contours normal. Decreased lung volumes with bibasilar atelectasis versus infiltrate. Probable small bibasilar effusions.  Upper lungs clear. No pleural effusion or pneumothorax. Bones demineralized.  IMPRESSION: Enlargement of cardiac silhouette. Bibasilar atelectasis versus infiltrate. Aeration in left lower lobe is perhaps slightly increased versus previous study.  Original Report Authenticated By: Lollie Marrow, M.D.   Dg Chest Port 1 View  03/19/2011  *RADIOLOGY REPORT*  Clinical Data: Fever and weakness.  PORTABLE CHEST - 1 VIEW  Comparison: 11/28/2010  Findings: Bilateral lower lobe infiltrates present, slightly more prominently on the left.  There likely are small bilateral pleural effusions, left greater than right.  No edema.  The heart size is normal.  IMPRESSION: Bilateral lower lobe infiltrates.  Original Report Authenticated By: Reola Calkins, M.D.    Consults:     Subjective:    Overnight Issues: This is our initial consult with the patient.  Medical record indicated  that patient was complaining of left sided CP, and epigastric abdominal pain earlier this today.  She also has 2 episodes of coffee ground emesis.  She was noted to have some agitation and increase delirium.  During admission into the ICU, she had a episode of maroon color emesis.  Currently, she complaints of throat pain and LUQ pain.  She states that she want to drinks some water.  Objective:  Vital signs for last 24 hours: Temp:  [98.2 F (36.8 C)-101.3 F (38.5 C)] 99.8 F (37.7 C) (11/30 1400) Pulse Rate:  [103-116] 107  (11/30 1400) Resp:  [16-28] 28  (11/30 1400) BP: (117-160)/(77-93) 152/93 mmHg (11/30 1400) SpO2:  [85 %-97 %] 97 % (11/30 1400) Weight:  [77.8 kg (171 lb 8.3 oz)] 171 lb 8.3 oz (77.8 kg) (11/29 2150)  Hemodynamic parameters for last 24 hours:    Intake/Output from previous day: 11/29 0701 - 11/30 0700 In: 1312.5 [P.O.:240; I.V.:872.5; IV Piggyback:200] Out: -   Intake/Output this shift:    Vent settings for last 24 hours:   Currently NOT on ventilator supports Stable at room air  Physical Exam:  General: Well developed/nourished female with slight agitation and moaning Head:    NCAT Eyes:    EOMI, PERRLA, no jaundice, no discharge Ears:   Intact, no hearing deficit, no discharge Nose:   NG tube in place, no discharge, no congestion Throat:  Midline trachea, normal thyroid, no JVD/LAD Lungs:  CTAB, good aeration, no adventitious sounds Heart:  Tachycardic, RR, no murmurs/rubs/gallops Abdomen:   No bowel sound appreciated, slight distention and decrease softness   Diffuse tenderness to light palpation, no guarding   Complains of nausea Skin:  No itching, no jaundice Neuro:  In pain, AAOx3  Assessment/Plan:   NEURO:  Slightly agitated, seem drowsy, moaning with complain of pain, to some degree ETOH WD evident, no DT's  Likely due to abdominal pain and pancreatitis  Ativan, MD driven WD no CIWA  LFT  Ensure thiamine, folic, mvi  PULM:  ATX No  respiratory distress present  CO2 is within normal limit  Elevation of WBC despite Primaxin is on board   Might have to consider coverage for aspiration pneumonia  Low threshold intubation with bleeding and Mental status, observe for now  CARDIO:   Hypertension  Patients has no previous history  Likely due to ETOH WD  Tele  Monitor for drop in BP with bleeding STAT cbc noted then q4h Lactic acid  RENAL:   No acute process  Will continue to monitor closely with emesis   BMET daily   GI: GI Bleeding  2 episodes of coffee ground emesis this  AM and one maroon color emesis just now  Nausea present  Change Protonix to drips  Type and screen for future transfusion needs  NPO for now, NG tube suction, done  Appreciate GI consult  EGD?  No prior varicies, no octreotide now  Npo   Pancreatitis from ETOH  Lipase at 1626  NPO  Continue fluid resuscitation and pain management  Need cvp  See ID  ID:   Fever spike with elevation WBC, pancreatitis vs pneumonia  Primaxin on board  Sent sputum culture if any  Blood and urine culture are in progress  Add vanc for enteroccus, pseudocysts of concern   HEME: Anemia  Secondary to GI bleeding  Type and screen for transfusion need  STAT cbc  ENDO: No acute process  Consider stress steroids if drop BP  cbg with liver dz  GLOBAL ISSUES:   LOS: 3 days   Additional comments:  Critical Care Total Time:  Parke Poisson 03/20/2011  Mcarthur Rossetti. Tyson Alias, MD, FACP Pgr: [phone] [facility]   *Care during the described time interval was provided by me and/or other providers on the critical care team.  I have reviewed this patient's available data, including medical history, events of note, physical examination and test results as part of my evaluation.

## 2011-03-20 NOTE — Consult Note (Signed)
[name]  Referring Provider: [facility] Ellin Saba, MD) Primary Care Physician:  No primary provider on file. Primary Gastroenterologist:   Gentry Fitz. Patient saw [facility] in 2009 but is in process of being discharged from their practice. Patient FORMALLY discharged from our practice last year.  Reason for [name]:  Pancreatitis, upper GI bleed.  HPI: [name] is a 55 y.o. female with a history of ETOH abuse, recurrent acute pancreatitis, steatohepatitis and chronic diarrhea. She was discharged from our practice a year ago. Patient has a history with [facility] but is being discharged from their practice as well. Patient was admitted in August of this year with epigastric pain, no evidence of acute pancreatitis. An EGD by [facility] was negative. She was readmitted to the hospital two days ago with similar epigastric pain. CTscan reveals significant acute pancreatitis complicated by pseudocysts with mild devascularization of the pancreatic head. There is also marked associated soft tissue inflammation wraps about the first and second segments of the duodenum, with focal wall edema at the second segment of the duodenum. Significant soft tissue stranding and fluid track inferiorly and laterally along Gerota's fascia on the right side. Patient was in a medical bed until she began having chest / epigastric pain and hematemesis today requiring transfer to the unit. According to critical care team, blood initially bright red, now more coffee ground in nature. Patient alert and oriented at times but has exhibited altered mental status at times so history comes mainly from records. Patient hemodynamically stable but hemoglobin down slightly in last several hours from 13.6 to 10.9. She isn't having any melena.   Past Medical History  Diagnosis Date  . GERD (gastroesophageal reflux disease)   . Rosacea   . Chronic diarrhea   . Pancreatitis 01/2010    2011 admission  was second admission, 3rd in 02/2011  . Alcohol abuse     H/o withdrawal     Past Surgical History  Procedure Date  . Cholecystectomy   . Shoulder surgery [id number]  . Diagnostic mammogram 2008  . Tubal ligation   . Colonoscopy Never    Prior to Admission medications   Not on File    Current Facility-Administered Medications  Medication Dose Route Frequency Provider Last Rate Last Dose  . 0.9 % NaCl with KCl 20 mEq/ L  infusion   Intravenous Continuous Carlota Raspberry, MD 75 mL/hr at 03/19/11 2357    . acetaminophen (TYLENOL) tablet 650 mg  650 mg Oral Q6H PRN Carlota Raspberry, MD       Or  . acetaminophen (TYLENOL) suppository 650 mg  650 mg Rectal Q6H PRN Carlota Raspberry, MD      . imipenem-cilastatin (PRIMAXIN) 500 mg in sodium chloride 0.9 % 100 mL IVPB  500 mg Intravenous Q8H Sorin Laza   500 mg at 03/20/11 0500  . LORazepam (ATIVAN) injection 0-4 mg  0-4 mg Intravenous Q6H Carlota Raspberry, MD   1 mg at 03/20/11 0114   Followed by  . LORazepam (ATIVAN) injection 0-4 mg  0-4 mg Intravenous Q12H Carlota Raspberry, MD   2 mg at 03/20/11 0800  . LORazepam (ATIVAN) tablet 1 mg  1 mg Oral Q6H PRN Carlota Raspberry, MD   1 mg at 03/18/11 1824   Or  . LORazepam (ATIVAN) injection 1 mg  1 mg Intravenous Q6H PRN Carlota Raspberry, MD   1 mg at 03/20/11 0411  . ondansetron (ZOFRAN) 4 MG/2ML injection           .  ondansetron (ZOFRAN) injection 4 mg  4 mg Intravenous Once Raeford Razor, MD   4 mg at 03/20/11 1421  . ondansetron (ZOFRAN) tablet 4 mg  4 mg Oral Q6H PRN Carlota Raspberry, MD       Or  . ondansetron [name]) injection 4 mg  4 mg Intravenous Q6H PRN Carlota Raspberry, MD   4 mg at 03/20/11 0801  . pantoprazole (PROTONIX) 80 mg in sodium chloride 0.9 % 250 mL infusion  8 mg/hr Intravenous Continuous Meredith Pel, NP      . prochlorperazine (COMPAZINE) injection 10 mg  10 mg Intravenous Q6H PRN Carlota Raspberry, MD      . promethazine (PHENERGAN) injection 12.5 mg  12.5 mg Intravenous Q6H PRN Carlota Raspberry, MD   12.5 mg at 03/19/11 2105    . sodium chloride 0.9 % bolus 500 mL  500 mL Intravenous Once Sorin Laza      . sodium chloride 0.9 % bolus 500 mL  500 mL Intravenous Once Sorin Laza   500 mL at 03/20/11 1119  . sodium chloride 0.9 % injection           . sodium chloride 0.9 % injection           . thiamine (B-1) injection 100 mg  100 mg Intravenous Daily Sorin Laza   100 mg at 03/20/11 1246  . DISCONTD: folic acid (FOLVITE) tablet 1 mg  1 mg Oral Daily Carlota Raspberry, MD   1 mg at 03/19/11 0951  . DISCONTD: heparin injection 5,000 Units  5,000 Units Subcutaneous Q8H Carlota Raspberry, MD   5,000 Units at 03/20/11 0502  . DISCONTD: HYDROmorphone (DILAUDID) injection 1 mg  1 mg Intravenous Q2H PRN Sorin Laza   1 mg at 03/19/11 1659  . DISCONTD: HYDROmorphone (DILAUDID) injection 1 mg  1 mg Intravenous Q4H PRN Sorin Laza   1 mg at 03/20/11 1040  . DISCONTD: LORazepam (ATIVAN) injection 1 mg  1 mg Intravenous Q6H Sorin Laza      . DISCONTD: morphine 2 MG/ML injection 4 mg  4 mg Intravenous Q4H PRN Carlota Raspberry, MD   4 mg at 03/19/11 0355  . DISCONTD: multivitamins ther. w/minerals tablet 1 tablet  1 tablet Oral Daily Carlota Raspberry, MD   1 tablet at 03/19/11 [phone]  . DISCONTD: oxyCODONE (Oxy IR/ROXICODONE) immediate release tablet 5 mg  5 mg Oral Q4H PRN Carlota Raspberry, MD   5 mg at 03/18/11 1450  . DISCONTD: pantoprazole (PROTONIX) injection 40 mg  40 mg Intravenous Q12H Sorin Laza   40 mg at 03/20/11 1244  . DISCONTD: promethazine (PHENERGAN) tablet 12.5 mg  12.5 mg Oral Q6H PRN Carlota Raspberry, MD      . DISCONTD: senna (SENOKOT) tablet 17.2 mg  2 tablet Oral Daily PRN Carlota Raspberry, MD      . DISCONTD: thiamine (VITAMIN B-1) tablet 100 mg  100 mg Oral Daily Carlota Raspberry, MD   100 mg at 03/19/11 0951  . DISCONTD: zolpidem (AMBIEN) tablet 5 mg  5 mg Oral QHS PRN Carlota Raspberry, MD   5 mg at 03/19/11 0053    Allergies as of 03/17/2011 - Review Complete 03/17/2011  Allergen Reaction Noted  . Ciprofloxacin      Family History  Problem Relation Age of Onset   . Stroke Mother   . Heart disease Father   . Heart disease Sister     History   Social History  . Marital Status: Divorced    Spouse Name:  N/A    Number of Children: N/A  . Years of Education: N/A   Occupational History  . Not on file.   Social History Main Topics  . Smoking status: Former Games developer  . Smokeless tobacco: Never Used  . Alcohol Use: 1.5 - 2.0 oz/week    3-4 drink(s) per week     2-10 drinks per week as of 09/01/10  . Drug Use: No  . Sexually Active: Not on file     Works in data entry and accounting at [employer]   Other Topics Concern  . Not on file   Social History Narrative   Has a son and some friends with whom she is close. Was in rehab in 09/2010 and was sober for a few months after.    Review of Systems: Not obtained secondary to intermittent confusion.  Physical Exam: Vital signs in last 24 hours: Temp:  [98.2 F (36.8 C)-101.3 F (38.5 C)] 99.8 F (37.7 C) (11/30 1400) Pulse Rate:  [102-116] 102  (11/30 1500) Resp:  [16-28] 25  (11/30 1500) BP: (117-160)/(77-93) 152/93 mmHg (11/30 1400) SpO2:  [85 %-97 %] 97 % (11/30 1500) Weight:  [77.8 kg (171 lb 8.3 oz)] 171 lb 8.3 oz (77.8 kg) (11/29 2150) Last BM Date: 03/17/11 General:   Slightly drowsy white female in NAD Head:  Normocephalic and atraumatic. NGT in place to wall suction. Cannister full of  dark red to black blood. . Eyes:   No icterus.   Conjunctiva pink. Ears:  Normal auditory acuity. Neck:  Supple; no masses felt Lungs:  Respirations even and unlabored. Lungs clear to auscultation bilaterlly.   No wheezes, crackles, or rhonchi.  Heart:  Regular rate and rhythm Abdomen:  Soft, nondistended, moderate epigastric tenderness. Normal bowel sounds. No abdominal guarding.No masses or hepatomegaly  Msk:  Symmetrical without gross deformities.  Extremities:  Without edema. Neurologic:  Slightly drowzy. Skin:  Intact without significant lesions or rashes. Cervical Nodes:  No significant  cervical adenopathy. Psych:  Alert and cooperative. Normal mood and affect.   Lab Results:  Basename 03/20/11 1415 03/20/11 0706 03/19/11 0540  WBC 19.4* 17.8* 11.0*  HGB 10.9* 13.6 12.4  HCT 31.8* 40.0 38.7  PLT 195 185 177   BMET  Basename 03/20/11 1111 03/20/11 0706 03/19/11 0540  NA 136 137 134*  K 3.6 3.7 3.5  CL 95* 95* 99  CO2 27 25 26   GLUCOSE 161* 119* 92  BUN 7 6 6   CREATININE 0.56 0.58 0.64  CALCIUM 8.9 9.1 8.5   LFT  Basename 03/20/11 1111  PROT 7.2  ALBUMIN 2.9*  AST 28  ALT 35  ALKPHOS 113  BILITOT 0.9  BILIDIR --  IBILI --    Studies/Results: Dg Chest Port 1 View  03/20/2011  *RADIOLOGY REPORT*  Clinical Data: Chest pain  PORTABLE CHEST - 1 VIEW  Comparison: Portable exam 1139 hours compared to 03/19/2011  Findings: Enlargement of cardiac silhouette. Mild pulmonary vascular congestion. Mediastinal contours normal. Decreased lung volumes with bibasilar atelectasis versus infiltrate. Probable small bibasilar effusions. Upper lungs clear. No pleural effusion or pneumothorax. Bones demineralized.  IMPRESSION: Enlargement of cardiac silhouette. Bibasilar atelectasis versus infiltrate. Aeration in left lower lobe is perhaps slightly increased versus previous study.  Original Report Authenticated By: Lollie Marrow, M.D.   Dg Chest Port 1 View  03/19/2011  *RADIOLOGY REPORT*  Clinical Data: Fever and weakness.  PORTABLE CHEST - 1 VIEW  Comparison: 11/28/2010  Findings: Bilateral lower lobe infiltrates present, slightly  more prominently on the left.  There likely are small bilateral pleural effusions, left greater than right.  No edema.  The heart size is normal.  IMPRESSION: Bilateral lower lobe infiltrates.  Original Report Authenticated By: Reola Calkins, M.D.    Previous Endoscopies: EGD August 2012 [facility]) - hiatal hernia, otherwise normal. Small bowel biopsies normal.  Impression / Plan: 1. Recurrent acute pancreatitis (ETOH suspected) complicated by  pseudocysts and mild devascularization of the pancreatic head. There is also marked associated soft tissue inflammation about the first and second segments of the duodenum, with focal wall edema at the second segment of the duodenum. Duodenal inflammation likely secondary to acute pancreatitis. Continue supportive care, follow up imaging may be needed within next several days.   2. Hematemesis, unclear if initial emesis bloody or not. First blood was bright red followed by more coffee ground. She has almost filled 3 wall cannisters since onset of bleeding but is hemodynamically stable at present. She has had a drop in hematocrit from 40 to 31.8 however. Suspect Mallory Weiss tear. Despite history of ETOH abuse doubt varices as she had a normal EGD in August of this year. PUD is possible.Change PPI to continuous drip, transfuse as needed. Patient may need to be intubated for airway protection per CCM. She will need EGD, ideally after intubation ( if plan is for intubation). Patient unable to sign consent, she apparently has no family available to help with decisions so procedure will need to be deemed medically necessary.  Patient tells me she has a son but isn't in contact with him. 3. Chest pain / epigastric pain. May be related to pancreatitis, esophagitis, PUD. 4. Altered mental status, secondary to ETOH withdrawal?  Sepsis? Workup in progress. On IV antibiotics.   5. Bilateral lobe infiltrates, on antibiotics.  6. Steatohepatitis. Liver biopsy at time of cholecystectomy in 2010 c/w steatohepatitis, patchy periportal fibrosis and perisinusoidal fibrosis.   Thank You,  LOS: 3 days   Willette Cluster  03/20/2011, 4:14 PM  GI ATTENDING HX, CHART, LABS, XRAYS REVIEWED. PATIENT PERSONALLY SEEN AND EXAMINED. AGREE W/ ABOVE H&P. ADMITTED W/ ACUTE ON CHRONIC PANCREATITIS. NOW WITH HEMATEMESIS AFTER EPISODES OF NONBLOODY EMESIS. NO MELENA. HEMODYNAMICALLY STABLE. NO EVIDENCE FOR PSEUDOANEURYSM ON CT. SUSPECT  MALLORY WEISS TEAR. RECOMMEND NPO, IV PPI, ANTIEMETIC. MAY NEED EGD IF THINGS DON'T SETTLE DOWN. WILL FOLLOW . THANKS ... JNP

## 2011-03-20 NOTE — Progress Notes (Signed)
Subjective: Very agitated - itting at the side of the bed - c/o severe left side chest pain and epigastric pain. Vomiting coffee ground emesis.    Physical Exam: Blood pressure 160/82, pulse 103, temperature 98.4 F (36.9 C), temperature source Oral, resp. rate 18, height 5\' 6"  (1.676 m), weight 77.8 kg (171 lb 8.3 oz), SpO2 92.00%. Alert  Unfortunately today she is not oriented to place or time  Chest bilateral sounds - no wheezing Heart tachycardic regular rate and rhythm without murmurs rubs or gallops Abdomen soft with moderate epigastric tenderness - bowel sounds diminished   Investigations:  No results found for this or any previous visit (from the past 240 hour(s)).   Basic Metabolic Panel:  Basename 03/20/11 0706 03/19/11 0540  NA 137 134*  K 3.7 3.5  CL 95* 99  CO2 25 26  GLUCOSE 119* 92  BUN 6 6  CREATININE 0.58 0.64  CALCIUM 9.1 8.5  MG -- --  PHOS -- --   Liver Function Tests:  [facility] 03/18/11 0952 03/18/11 0030  AST 81* 45*  ALT 53* 41*  ALKPHOS 88 82  BILITOT 1.2 0.7  PROT 6.5 6.6  ALBUMIN 3.0* 3.1*     CBC:  Basename 03/20/11 0706 03/19/11 0540  WBC 17.8* 11.0*  NEUTROABS -- --  HGB 13.6 12.4  HCT 40.0 38.7  MCV 101.8* 103.5*  PLT 185 177    Dg Chest Port 1 View  03/19/2011  *RADIOLOGY REPORT*  Clinical Data: Fever and weakness.  PORTABLE CHEST - 1 VIEW  Comparison: 11/28/2010  Findings: Bilateral lower lobe infiltrates present, slightly more prominently on the left.  There likely are small bilateral pleural effusions, left greater than right.  No edema.  The heart size is normal.  IMPRESSION: Bilateral lower lobe infiltrates.  Original Report Authenticated By: Reola Calkins, M.D.      Medications: I have reviewed the patient's current medications.  Impression: 55 year old woman with alcohol abuse admitted with acute pancreatitis, Today she is markedly worse with delirium, severe chest pain, hematemesis, severe chest pain and  abdominal pain    Plan: Transfer to ICU PPI iv GI consult Acute abdominal series Iv fluids Strict NPO    LOS: 3 days   Etheridge Geil 03/20/2011, 10:46 AM

## 2011-03-20 NOTE — Progress Notes (Signed)
eLink Physician-Brief Progress Note Patient Name: [name] DOB: December 10, 1955 MRN: [id number]  Date of Service  03/20/2011   HPI/Events of Note   DVT prophylaxis, GIB  eICU Interventions  Placed SCD   Intervention Category Intermediate Interventions: Best-practice therapies (e.g. DVT, beta blocker, etc.)  Ceili Boshers S. 03/20/2011, 3:29 PM

## 2011-03-21 ENCOUNTER — Encounter (HOSPITAL_COMMUNITY)
Admission: EM | Disposition: A | Discharge status: Home / Self Care | Payer: Self-pay | Source: Ambulatory Visit | Attending: Internal Medicine

## 2011-03-21 ENCOUNTER — Encounter (HOSPITAL_COMMUNITY): Payer: Self-pay | Admitting: *Deleted

## 2011-03-21 ENCOUNTER — Inpatient Hospital Stay (HOSPITAL_COMMUNITY): Payer: Self-pay

## 2011-03-21 DIAGNOSIS — K210 Gastro-esophageal reflux disease with esophagitis: Secondary | ICD-10-CM

## 2011-03-21 DIAGNOSIS — K226 Gastro-esophageal laceration-hemorrhage syndrome: Secondary | ICD-10-CM

## 2011-03-21 HISTORY — PX: ESOPHAGOGASTRODUODENOSCOPY: SHX5428

## 2011-03-21 LAB — CBC
HCT: 22.8 % — ABNORMAL LOW (ref 36.0–46.0)
HCT: 25.2 % — ABNORMAL LOW (ref 36.0–46.0)
HCT: 25.2 % — ABNORMAL LOW (ref 36.0–46.0)
Hemoglobin: 9.7 g/dL — ABNORMAL LOW (ref 12.0–15.0)
Hemoglobin: 8.6 g/dL — ABNORMAL LOW (ref 12.0–15.0)
Hemoglobin: 8.5 g/dL — ABNORMAL LOW (ref 12.0–15.0)
MCH: 32.8 pg (ref 26.0–34.0)
MCH: 34.1 pg — ABNORMAL HIGH (ref 26.0–34.0)
MCHC: 33.7 g/dL (ref 30.0–36.0)
MCV: 100.9 fL — ABNORMAL HIGH (ref 78.0–100.0)
MCV: 97.7 fL (ref 78.0–100.0)
Platelets: 183 10*3/uL (ref 150–400)
RBC: 2.26 MIL/uL — ABNORMAL LOW (ref 3.87–5.11)
RBC: 2.58 MIL/uL — ABNORMAL LOW (ref 3.87–5.11)
RBC: 2.88 MIL/uL — ABNORMAL LOW (ref 3.87–5.11)
RDW: 11.8 % (ref 11.5–15.5)
WBC: 16.5 10*3/uL — ABNORMAL HIGH (ref 4.0–10.5)
WBC: 16.7 10*3/uL — ABNORMAL HIGH (ref 4.0–10.5)
WBC: 17 10*3/uL — ABNORMAL HIGH (ref 4.0–10.5)

## 2011-03-21 LAB — POCT I-STAT 3, ART BLOOD GAS (G3+)
Acid-Base Excess: 1 mmol/L (ref 0.0–2.0)
Bicarbonate: 24.4 meq/L — ABNORMAL HIGH (ref 20.0–24.0)
O2 Saturation: 96 %
Patient temperature: 98.4
TCO2: 25 mmol/L (ref 0–100)
pCO2 arterial: 31.4 mmHg — ABNORMAL LOW (ref 35.0–45.0)
pH, Arterial: 7.498 — ABNORMAL HIGH (ref 7.350–7.400)
pO2, Arterial: 71 mmHg — ABNORMAL LOW (ref 80.0–100.0)

## 2011-03-21 LAB — BASIC METABOLIC PANEL
CO2: 30 mEq/L (ref 19–32)
Calcium: 7.3 mg/dL — ABNORMAL LOW (ref 8.4–10.5)
Creatinine, Ser: 0.53 mg/dL (ref 0.50–1.10)
Glucose, Bld: 135 mg/dL — ABNORMAL HIGH (ref 70–99)

## 2011-03-21 LAB — PREPARE RBC (CROSSMATCH)

## 2011-03-21 LAB — GLUCOSE, CAPILLARY
Glucose-Capillary: 151 mg/dL — ABNORMAL HIGH (ref 70–99)
Glucose-Capillary: 142 mg/dL — ABNORMAL HIGH (ref 70–99)

## 2011-03-21 SURGERY — EGD (ESOPHAGOGASTRODUODENOSCOPY)
Anesthesia: Moderate Sedation

## 2011-03-21 MED ORDER — SODIUM CHLORIDE 0.9 % IJ SOLN
INTRAMUSCULAR | Status: AC
  Administered 2011-03-21: 17:00:00
  Filled 2011-03-21: qty 50

## 2011-03-21 MED ORDER — FENTANYL CITRATE 0.05 MG/ML IJ SOLN
INTRAMUSCULAR | Status: AC
  Administered 2011-03-21: 200 ug
  Filled 2011-03-21: qty 2

## 2011-03-21 MED ORDER — POTASSIUM CHLORIDE 10 MEQ/100ML IV SOLN
10.0000 meq | INTRAVENOUS | Status: AC
  Administered 2011-03-21 (×4): 10 meq via INTRAVENOUS
  Filled 2011-03-21 (×4): qty 100

## 2011-03-21 MED ORDER — SODIUM CHLORIDE 0.9 % IV SOLN
50.0000 ug/h | INTRAVENOUS | Status: DC
  Administered 2011-03-21: 50 ug/h via INTRAVENOUS
  Administered 2011-03-22 (×2): 100 ug/h via INTRAVENOUS
  Filled 2011-03-21 (×3): qty 50

## 2011-03-21 MED ORDER — DIPHENHYDRAMINE HCL 50 MG/ML IJ SOLN
INTRAMUSCULAR | Status: AC
  Filled 2011-03-21: qty 1

## 2011-03-21 MED ORDER — SODIUM CHLORIDE 0.9 % IJ SOLN
INTRAMUSCULAR | Status: AC
  Administered 2011-03-21: 17:00:00
  Filled 2011-03-21: qty 20

## 2011-03-21 MED ORDER — MIDAZOLAM HCL 2 MG/2ML IJ SOLN
INTRAMUSCULAR | Status: AC
  Administered 2011-03-21: 4 mg
  Filled 2011-03-21: qty 4

## 2011-03-21 MED ORDER — PROPOFOL 10 MG/ML IV EMUL
INTRAVENOUS | Status: AC
  Administered 2011-03-21: 70 ug/kg/min via INTRAVENOUS
  Filled 2011-03-21: qty 100

## 2011-03-21 MED ORDER — MIDAZOLAM HCL 2 MG/2ML IJ SOLN
INTRAMUSCULAR | Status: AC
  Administered 2011-03-21: 2 mg
  Filled 2011-03-21: qty 2

## 2011-03-21 MED ORDER — NOREPINEPHRINE BITARTRATE 1 MG/ML IJ SOLN
2.0000 ug/min | INTRAVENOUS | Status: DC
  Administered 2011-03-21: 3 ug/min via INTRAVENOUS
  Administered 2011-03-22: 4 ug/min via INTRAVENOUS
  Administered 2011-03-22: 6 ug/min via INTRAVENOUS
  Filled 2011-03-21 (×3): qty 4

## 2011-03-21 MED ORDER — SODIUM CHLORIDE 0.9 % IV BOLUS (SEPSIS)
500.0000 mL | Freq: Once | INTRAVENOUS | Status: AC
  Administered 2011-03-21: 500 mL via INTRAVENOUS

## 2011-03-21 MED ORDER — FENTANYL CITRATE 0.05 MG/ML IJ SOLN
INTRAMUSCULAR | Status: AC
  Filled 2011-03-21: qty 2

## 2011-03-21 MED ORDER — SODIUM CHLORIDE 0.9 % IJ SOLN
INTRAMUSCULAR | Status: DC | PRN
  Administered 2011-03-21: 15:00:00

## 2011-03-21 MED ORDER — FENTANYL CITRATE 0.05 MG/ML IJ SOLN
INTRAMUSCULAR | Status: AC
  Administered 2011-03-21: 100 ug
  Filled 2011-03-21: qty 4

## 2011-03-21 MED ORDER — EPINEPHRINE HCL 0.1 MG/ML IJ SOLN
INTRAMUSCULAR | Status: AC
  Filled 2011-03-21: qty 10

## 2011-03-21 MED ORDER — PROPOFOL 10 MG/ML IV EMUL
5.0000 ug/kg/min | INTRAVENOUS | Status: DC
  Administered 2011-03-21 (×4): 70 ug/kg/min via INTRAVENOUS
  Administered 2011-03-22: 50 ug/kg/min via INTRAVENOUS
  Administered 2011-03-22 – 2011-03-23 (×5): 60 ug/kg/min via INTRAVENOUS
  Filled 2011-03-21 (×14): qty 100

## 2011-03-21 MED ORDER — MIDAZOLAM HCL 10 MG/2ML IJ SOLN
INTRAMUSCULAR | Status: AC
  Filled 2011-03-21: qty 2

## 2011-03-21 NOTE — Procedures (Signed)
Intubation Procedure Note ZHANE DONLAN [id number] [birth year]  Procedure: Intubation Indications: Airway protection and maintenance  Procedure Details Consent: Risks of procedure as well as the alternatives and risks of each were explained to the (patient/caregiver).  Consent for procedure obtained. Time Out: Verified patient identification, verified procedure, site/side was marked, verified correct patient position, special equipment/implants available, medications/allergies/relevent history reviewed, required imaging and test results available.  Performed  MAC and 4, DL x1, grade II view, ETT passed through cords under direct visualization   Evaluation Hemodynamic Status: BP stable throughout; O2 sats: stable throughout Patient's Current Condition: stable Complications: No apparent complications Patient did tolerate procedure well. Chest X-ray ordered to verify placement.  CXR: pending.   MCQUAID, DOUGLAS 03/21/2011

## 2011-03-21 NOTE — Interval H&P Note (Signed)
History and Physical Interval Note:  03/21/2011 3:06 PM  [name]  has presented today for surgery, with the diagnosis of hematemesis  The various methods of treatment have been discussed with the patient and family. After consideration of risks, benefits and other options for treatment, the patient has consented to  Procedure(s): ESOPHAGOGASTRODUODENOSCOPY (EGD) as a surgical intervention .  The patients' history has been reviewed, patient examined, no change in status, stable for surgery.  I have reviewed the patients' chart and labs.  Questions were answered to the patient's satisfaction.     Yancey Flemings

## 2011-03-21 NOTE — H&P (View-Only) (Signed)
PATIENT WITH SIGNIFICANT INTERVAL BLEEDING. NOW INTUBATED TO PROTECT AIRWAY. PLAN EGD IN ICU... JNP

## 2011-03-21 NOTE — Progress Notes (Signed)
Follow up - Critical Care Medicine Note  Patient Details:    [name] is an 55 y.o. 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  She is also noted to have a PMH of GERD, Rosacea, and elevated LFTs.  She was presented to the ED with abdominal pain and was subsequently admitted for acute exacerbation of pancreatitis on 03/18/11.  Earlier today, she has 2 episodes of coffee ground emesis.  She also has some agitation, complained of left side CP and epigastric pain.  Per internal medicine assessment she was also delirious.  Her care was transferred to [name]. Acute increased GI bleeding.  Lines, Airways, Drains: 11/30 Left arm peripheral lines x 2 >>> 11/30 NG tube >>> 12/1 ETT >>  Anti-infectives:  11/29 Primaxin >>> 11/30 vanc>>>  Other Medications: Ativan Zofran Protonix Thiamin vitamin Compazine Promethazine  Anti-infectives     Start     Dose/Rate Route Frequency Ordered Stop   03/21/11 0100   vancomycin (VANCOCIN) 750 mg in sodium chloride 0.9 % 150 mL IVPB        750 mg 150 mL/hr over 60 Minutes Intravenous Every 8 hours 03/20/11 1647     03/20/11 1700   vancomycin (VANCOCIN) IVPB 1000 mg/200 mL premix     Comments: STAT per pharamcy      1,000 mg 200 mL/hr over 60 Minutes Intravenous Every 12 hours 03/20/11 1620 03/20/11 1827   03/19/11 2200   imipenem-cilastatin (PRIMAXIN) 500 mg in sodium chloride 0.9 % 100 mL IVPB        500 mg 200 mL/hr over 30 Minutes Intravenous 3 times per day 03/19/11 1829            Microbiology: 08/09 MRSA screening >>> Negative 11/29 Blood culture >>> Pending 11/29 Urine culture >>> Pending  Results for orders placed during the hospital encounter of 03/17/11  CULTURE, BLOOD (ROUTINE X 2)     Status: Normal (Preliminary result)   Collection Time   03/19/11  8:11 PM      Component Value Range Status Comment   Specimen Description BLOOD RIGHT ARM   Final    Special Requests BOTTLES DRAWN AEROBIC AND ANAEROBIC 10CC   Final    Setup Time [id number]   Final    Culture     Final    Value:        BLOOD CULTURE RECEIVED NO GROWTH TO DATE CULTURE WILL BE HELD FOR 5 DAYS BEFORE ISSUING A FINAL NEGATIVE REPORT   Report Status PENDING   Incomplete   CULTURE, BLOOD (ROUTINE X 2)     Status: Normal (Preliminary result)   Collection Time   03/19/11  8:11 PM      Component Value Range Status Comment   Specimen Description BLOOD LEFT HAND   Final    Special Requests BOTTLES DRAWN AEROBIC ONLY 10CC   Final    Setup Time [id number]   Final    Culture     Final    Value:        BLOOD CULTURE RECEIVED NO GROWTH TO DATE CULTURE WILL BE HELD FOR 5 DAYS BEFORE ISSUING A FINAL NEGATIVE REPORT   Report Status PENDING   Incomplete   MRSA PCR SCREENING     Status: Normal   Collection Time   03/20/11  5:42 PM      Component Value Range Status Comment   MRSA by PCR NEGATIVE  NEGATIVE  Final  Best Practice/Protocols:  GI prophylaxis:  Protonix 03/18/11 >>>  Events: 11/30 Increase delirium, left side chest pain, and coffee ground emesis in AM  Large amount maroon emesis around 3PM 12/1 bedside endo per gI >  Studies: Ct Abdomen Pelvis W Contrast  03/18/2011  *RADIOLOGY REPORT*  Clinical Data: Mid abdominal pain, with nausea and vomiting.  CT ABDOMEN AND PELVIS WITH CONTRAST  Technique:  Multidetector CT imaging of the abdomen and pelvis was performed following the standard protocol during bolus administration of intravenous contrast.  Contrast: OMNIPAQUE IOHEXOL 300 MG/ML IV SOLN  Comparison: CT of the abdomen and pelvis, and abdominal ultrasound, performed 11/27/2010  Findings: Mild bibasilar atelectasis is noted.  There is marked soft tissue inflammation noted about the head and body of the pancreas, wrapping about the first and second segments of the duodenum, with mild involvement of the gastric antrum. Multiple rounded fluid collections are  identified at the pancreatic head and body, measuring 2.3 cm, 1.7 cm and 0.9 cm in size, likely reflecting small pseudocysts.  There is concern for mild devascularization at the pancreatic head.  There is associated focal wall edema along the second segment of the duodenum, with more diffuse wall thickening noted along the duodenum.  Soft tissue stranding and fluid track laterally and inferiorly along Gerota's fascia on the right side.  The liver and spleen are unremarkable in appearance.  The patient is status post cholecystectomy, with clips noted at the gallbladder fossa.  The common hepatic duct measures 0.9 cm, within normal limits status post cholecystectomy; minimal prominence of the intrahepatic biliary ducts likely also remains within normal limits.  The spleen is unremarkable in appearance.  The adrenal glands are within normal limits.  A few small hypodensities are noted within both kidneys, measuring up to 6 mm in size, likely reflecting small cysts.  Minimal nonspecific perinephric stranding is noted on the left side.  The kidneys are otherwise unremarkable in appearance.  There is no evidence of hydronephrosis.  No renal or ureteral stones are seen.  No free fluid is identified.  The small bowel is unremarkable in appearance.  A small to moderate hiatal hernia is noted, filled with contrast; trace adjacent fluid is nonspecific in appearance. The stomach is filled with contrast and is otherwise grossly unremarkable.  No acute vascular abnormalities are seen.  Scattered calcification is noted along the abdominal aorta and its branches.  The appendix is normal in caliber and filled with air, without evidence for appendicitis.  Minimal diverticulosis is noted at the splenic flexure of the colon, and mild diverticulosis is seen at the proximal sigmoid colon, without evidence of diverticulitis. The colon is otherwise unremarkable in appearance.  The bladder is mildly distended and grossly unremarkable.  The  uterus is within normal limits.  The ovaries are relatively symmetric; no suspicious adnexal masses are seen.  Trace fluid within the pelvis is likely physiologic in nature.  No inguinal lymphadenopathy is seen.  No acute osseous abnormalities are identified.  Vacuum phenomenon and disc space narrowing are noted at L4-L5.  IMPRESSION:  1.  Significant acute pancreatitis noted, involving the head and body of the pancreas. Multiple small pseudocysts noted at the pancreatic head and body, measuring up to 2.3 cm in size.  Likely mild devascularization of the pancreatic head. 2.  Marked associated soft tissue inflammation wraps about the first and second segments of the duodenum, with focal wall edema at the second segment of the duodenum.  Significant soft tissue stranding and  fluid track inferiorly and laterally along Gerota's fascia on the right side. 3.  Likely small bilateral renal cysts noted. 4.  Small to moderate hiatal hernia seen; trace nonspecific fluid noted adjacent to the hiatal hernia. 5.  Minimal diverticulosis at the splenic flexure of the colon, and mild diverticulosis of the proximal sigmoid colon, without evidence of diverticulitis. 6.  Mild bibasilar atelectasis.  Findings were discussed with Dr. Raeford Razor at 03:33 a.m. on 03/18/2011.  Original Report Authenticated By: Tonia Ghent, M.D.   Dg Chest Port 1 View  03/20/2011  *RADIOLOGY REPORT*  Clinical Data: Chest pain  PORTABLE CHEST - 1 VIEW  Comparison: Portable exam 1139 hours compared to 03/19/2011  Findings: Enlargement of cardiac silhouette. Mild pulmonary vascular congestion. Mediastinal contours normal. Decreased lung volumes with bibasilar atelectasis versus infiltrate. Probable small bibasilar effusions. Upper lungs clear. No pleural effusion or pneumothorax. Bones demineralized.  IMPRESSION: Enlargement of cardiac silhouette. Bibasilar atelectasis versus infiltrate. Aeration in left lower lobe is perhaps slightly increased versus  previous study.  Original Report Authenticated By: Lollie Marrow, M.D.   Dg Chest Port 1 View  03/19/2011  *RADIOLOGY REPORT*  Clinical Data: Fever and weakness.  PORTABLE CHEST - 1 VIEW  Comparison: 11/28/2010  Findings: Bilateral lower lobe infiltrates present, slightly more prominently on the left.  There likely are small bilateral pleural effusions, left greater than right.  No edema.  The heart size is normal.  IMPRESSION: Bilateral lower lobe infiltrates.  Original Report Authenticated By: Reola Calkins, M.D.   CXR 12/1 >Endotracheal tube 3.6 cm above the carina.  Bibasilar atelectasis, worse on the left.  Consults: Treatment Team:  Yancey Flemings, MD   Subjective:    Overnight Issues:  GI w/ bedside endo , intubated for airway protection/procedure  Objective:  Vital signs for last 24 hours: Temp:  [98.4 F (36.9 C)-99.6 F (37.6 C)] 98.4 F (36.9 C) (12/01 1234) Pulse Rate:  [91-123] 99  (12/01 1525) Resp:  [16-28] 19  (12/01 1525) BP: (92-192)/(41-79) 109/58 mmHg (12/01 1525) SpO2:  [93 %-100 %] 100 % (12/01 1525) FiO2 (%):  [39.8 %-40.2 %] 40 % (12/01 1525)  Hemodynamic parameters for last 24 hours:    Intake/Output from previous day: 11/30 0701 - 12/01 0700 In: 2012.5 [I.V.:1350; Blood:12.5; IV Piggyback:650] Out: 3300 [Emesis/NG output:3000; Stool:300]  Intake/Output this shift: Total I/O In: 937 [I.V.:175; Blood:612; IV Piggyback:150] Out: -   Vent settings for last 24 hours: Vent Mode:  [-] PRVC FiO2 (%):  [39.8 %-40.2 %] 40 % Set Rate:  [16 bmp] 16 bmp Vt Set:  [470 mL] 470 mL PEEP:  [5 cmH20] 5 cmH20 Plateau Pressure:  [14 cmH20] 14 cmH20 Currently NOT on ventilator supports Stable at room air  Physical Exam:  General: Sedated/intubated  Head:    NCAT Eyes:    EOMI, PERRLA, no jaundice, no discharge Ears:   Intact, no hearing deficit, no discharge Nose:   NG tube in place, no discharge, no congestion Throat:  Midline trachea, normal thyroid, no  JVD/LAD Lungs:  CTAB, good aeration, no adventitious sounds Heart:  Tachycardic, RR, no murmurs/rubs/gallops Abdomen:   No bowel sound appreciated, slight distention and decrease softness      Skin:  No itching, no jaundice Neuro:  In pain, AAOx3  Assessment/Plan:   NEURO:   agitated,intubated for GI procedure On sedation - ETOH WD evident, no DT's  Likely due to abdominal pain and pancreatitis  Ativan, MD driven WD no CIWA  LFT  Ensure thiamine, folic, mvi  PULM:  ATX No respiratory distress present  CO2 is within normal limit  Elevation of WBC despite Primaxin is on board   Follow cultures  Consider abdominal CT in AM if fever persists   CARDIO:   Hypotension    pressors w/ levophed 12/1   Check cvp q4      RENAL:   No acute process  Will continue to monitor closely with emesis   BMET daily  Hypokalemia >replaced   GI: GI Bleeding  Cont protonix   Type and screen for future transfusion needs  NPO for now, NG tube suction, done  Appreciate GI consult: scope now    Pancreatitis from ETOH  Lipase at 1626  NPO  Continue fluid resuscitation and pain management  Need cvp  See ID  ID:   Fever spike with elevation WBC, pancreatitis vs pneumonia  Cont abx    HEME: Anemia  Secondary to GI bleeding Follow cbc      ENDO: No acute process   GLOBAL ISSUES:   LOS: 4 days     Critical Care Total Time:  PARRETT,TAMMY NP PCCM  03/21/2011  Kerria Sapien, Riley Lam

## 2011-03-21 NOTE — Progress Notes (Addendum)
eLink Physician-Brief Progress Note Patient Name: [name] DOB: 01/09/1956 MRN: [id number]  Date of Service  03/21/2011   HPI/Events of Note  Hgb 7.7 >> 6.3. Adm with GIB.   eICU Interventions  Transfuse 2 units RBCs for active bleeding     Billy Fischer 03/21/2011, 6:00 AM

## 2011-03-21 NOTE — Brief Op Note (Signed)
SEE  Pentax report.  Mallory Weiss Tear successfully treated

## 2011-03-21 NOTE — Progress Notes (Signed)
PATIENT WITH SIGNIFICANT INTERVAL BLEEDING. NOW INTUBATED TO PROTECT AIRWAY. PLAN EGD IN ICU... JNP 

## 2011-03-22 ENCOUNTER — Inpatient Hospital Stay (HOSPITAL_COMMUNITY): Payer: Self-pay

## 2011-03-22 DIAGNOSIS — D62 Acute posthemorrhagic anemia: Secondary | ICD-10-CM

## 2011-03-22 DIAGNOSIS — K226 Gastro-esophageal laceration-hemorrhage syndrome: Secondary | ICD-10-CM

## 2011-03-22 DIAGNOSIS — K862 Cyst of pancreas: Secondary | ICD-10-CM

## 2011-03-22 DIAGNOSIS — K210 Gastro-esophageal reflux disease with esophagitis: Secondary | ICD-10-CM

## 2011-03-22 DIAGNOSIS — I509 Heart failure, unspecified: Secondary | ICD-10-CM

## 2011-03-22 DIAGNOSIS — K863 Pseudocyst of pancreas: Secondary | ICD-10-CM

## 2011-03-22 DIAGNOSIS — K920 Hematemesis: Secondary | ICD-10-CM

## 2011-03-22 DIAGNOSIS — K2901 Acute gastritis with bleeding: Secondary | ICD-10-CM

## 2011-03-22 LAB — COMPREHENSIVE METABOLIC PANEL
AST: 15 U/L (ref 0–37)
Albumin: 2 g/dL — ABNORMAL LOW (ref 3.5–5.2)
Alkaline Phosphatase: 63 U/L (ref 39–117)
Chloride: 105 mEq/L (ref 96–112)
Potassium: 3.2 mEq/L — ABNORMAL LOW (ref 3.5–5.1)
Sodium: 136 mEq/L (ref 135–145)
Total Bilirubin: 0.6 mg/dL (ref 0.3–1.2)

## 2011-03-22 LAB — CBC
HCT: 24.9 % — ABNORMAL LOW (ref 36.0–46.0)
HCT: 24.8 % — ABNORMAL LOW (ref 36.0–46.0)
MCH: 33.1 pg (ref 26.0–34.0)
MCV: 97.6 fL (ref 78.0–100.0)
Platelets: 205 10*3/uL (ref 150–400)
Platelets: 214 10*3/uL (ref 150–400)
Platelets: 212 10*3/uL (ref 150–400)
RBC: 2.54 MIL/uL — ABNORMAL LOW (ref 3.87–5.11)
RBC: 2.52 MIL/uL — ABNORMAL LOW (ref 3.87–5.11)
RBC: 2.54 MIL/uL — ABNORMAL LOW (ref 3.87–5.11)
RDW: 17.1 % — ABNORMAL HIGH (ref 11.5–15.5)
RDW: 17.1 % — ABNORMAL HIGH (ref 11.5–15.5)
WBC: 14.5 10*3/uL — ABNORMAL HIGH (ref 4.0–10.5)
WBC: 13.4 10*3/uL — ABNORMAL HIGH (ref 4.0–10.5)

## 2011-03-22 LAB — POCT I-STAT 3, ART BLOOD GAS (G3+)
Bicarbonate: 22.2 mEq/L (ref 20.0–24.0)
Patient temperature: 98.6
TCO2: 23 mmol/L (ref 0–100)
pH, Arterial: 7.413 — ABNORMAL HIGH (ref 7.350–7.400)
pO2, Arterial: 78 mmHg — ABNORMAL LOW (ref 80.0–100.0)

## 2011-03-22 LAB — GLUCOSE, CAPILLARY
Glucose-Capillary: 109 mg/dL — ABNORMAL HIGH (ref 70–99)
Glucose-Capillary: 105 mg/dL — ABNORMAL HIGH (ref 70–99)
Glucose-Capillary: 97 mg/dL (ref 70–99)

## 2011-03-22 MED ORDER — BIOTENE DRY MOUTH MT LIQD
15.0000 mL | Freq: Four times a day (QID) | OROMUCOSAL | Status: DC
  Administered 2011-03-23 – 2011-03-24 (×6): 15 mL via OROMUCOSAL

## 2011-03-22 MED ORDER — CHLORHEXIDINE GLUCONATE 0.12 % MT SOLN
15.0000 mL | Freq: Two times a day (BID) | OROMUCOSAL | Status: DC
  Administered 2011-03-22 – 2011-03-24 (×5): 15 mL via OROMUCOSAL
  Filled 2011-03-22 (×4): qty 15

## 2011-03-22 MED ORDER — POTASSIUM CHLORIDE 10 MEQ/50ML IV SOLN
10.0000 meq | INTRAVENOUS | Status: AC
  Administered 2011-03-22 – 2011-03-23 (×5): 10 meq via INTRAVENOUS
  Filled 2011-03-22 (×4): qty 50

## 2011-03-22 MED ORDER — CHLORHEXIDINE GLUCONATE 0.12 % MT SOLN
15.0000 mL | Freq: Two times a day (BID) | OROMUCOSAL | Status: DC
  Administered 2011-03-22 (×2): 15 mL via OROMUCOSAL
  Filled 2011-03-22 (×2): qty 15

## 2011-03-22 MED ORDER — POTASSIUM CHLORIDE 10 MEQ/50ML IV SOLN
10.0000 meq | INTRAVENOUS | Status: AC
  Administered 2011-03-22: 10 meq via INTRAVENOUS

## 2011-03-22 MED ORDER — SODIUM CHLORIDE 0.9 % IJ SOLN
INTRAMUSCULAR | Status: AC
  Administered 2011-03-22: 10:00:00
  Filled 2011-03-22: qty 20

## 2011-03-22 NOTE — Progress Notes (Signed)
eLink Physician-Brief Progress Note Patient Name: SIDRAH HARDEN DOB: 13-Mar-1956 MRN: [id number]  Date of Service  03/22/2011   HPI/Events of Note   K repletion  eICU Interventions  See orders   Intervention Category Major Interventions: Electrolyte abnormality - evaluation and management Intermediate Interventions: Best-practice therapies (e.g. DVT, beta blocker, etc.)  Shan Levans 03/22/2011, 6:03 AM

## 2011-03-22 NOTE — Progress Notes (Signed)
Follow up - Critical Care Medicine Note  Patient Details:    [name] is an 55 y.o. 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  She is also noted to have a PMH of GERD, Rosacea, and elevated LFTs.  She was presented to the ED with abdominal pain and was subsequently admitted for acute exacerbation of pancreatitis on 03/18/11.  Earlier today, she has 2 episodes of coffee ground emesis.  She also has some agitation, complained of left side CP and epigastric pain.  Per internal medicine assessment she was also delirious.  Her care was transferred to [facility]. Acute increased GI bleeding.  Lines, Airways, Drains: 11/30 Left arm peripheral lines x 2 >>> 11/30 NG tube >>> 12/1 ETT >>  Anti-infectives:  11/29 Primaxin >>> 11/30 vanc>>>  Other Medications: Ativan Zofran Protonix Thiamin vitamin Compazine Promethazine  Anti-infectives     Start     Dose/Rate Route Frequency Ordered Stop   03/21/11 0100   vancomycin (VANCOCIN) 750 mg in sodium chloride 0.9 % 150 mL IVPB        750 mg 150 mL/hr over 60 Minutes Intravenous Every 8 hours 03/20/11 1647     03/20/11 1700   vancomycin (VANCOCIN) IVPB 1000 mg/200 mL premix     Comments: STAT per pharamcy      1,000 mg 200 mL/hr over 60 Minutes Intravenous Every 12 hours 03/20/11 1620 03/20/11 1827   03/19/11 2200   imipenem-cilastatin (PRIMAXIN) 500 mg in sodium chloride 0.9 % 100 mL IVPB        500 mg 200 mL/hr over 30 Minutes Intravenous 3 times per day 03/19/11 1829            Microbiology: 08/09 MRSA screening >>> Negative 11/29 Blood culture >>> Pending 11/29 Urine culture >>> Pending  Results for orders placed during the hospital encounter of 03/17/11  CULTURE, BLOOD (ROUTINE X 2)     Status: Normal (Preliminary result)   Collection Time   03/19/11  8:11 PM      Component Value Range Status Comment   Specimen Description BLOOD RIGHT ARM   Final    Special Requests BOTTLES DRAWN AEROBIC AND ANAEROBIC 10CC   Final    Setup Time [id number]   Final    Culture     Final    Value:        BLOOD CULTURE RECEIVED NO GROWTH TO DATE CULTURE WILL BE HELD FOR 5 DAYS BEFORE ISSUING A FINAL NEGATIVE REPORT   Report Status PENDING   Incomplete   CULTURE, BLOOD (ROUTINE X 2)     Status: Normal (Preliminary result)   Collection Time   03/19/11  8:11 PM      Component Value Range Status Comment   Specimen Description BLOOD LEFT HAND   Final    Special Requests BOTTLES DRAWN AEROBIC ONLY 10CC   Final    Setup Time [id number]   Final    Culture     Final    Value:        BLOOD CULTURE RECEIVED NO GROWTH TO DATE CULTURE WILL BE HELD FOR 5 DAYS BEFORE ISSUING A FINAL NEGATIVE REPORT   Report Status PENDING   Incomplete   MRSA PCR SCREENING     Status: Normal   Collection Time   03/20/11  5:42 PM      Component Value Range Status Comment   MRSA by PCR NEGATIVE  NEGATIVE  Final  Best Practice/Protocols:  GI prophylaxis:  Protonix 03/18/11 >>>  Events: 11/30 Increase delirium, left side chest pain, and coffee ground emesis in AM  Large amount maroon emesis around 3PM 12/1 bedside endo per gI >  Studies: Ct Abdomen Pelvis W Contrast  03/18/2011  *RADIOLOGY REPORT*  Clinical Data: Mid abdominal pain, with nausea and vomiting.  CT ABDOMEN AND PELVIS WITH CONTRAST  Technique:  Multidetector CT imaging of the abdomen and pelvis was performed following the standard protocol during bolus administration of intravenous contrast.  Contrast: OMNIPAQUE IOHEXOL 300 MG/ML IV SOLN  Comparison: CT of the abdomen and pelvis, and abdominal ultrasound, performed 11/27/2010  Findings: Mild bibasilar atelectasis is noted.  There is marked soft tissue inflammation noted about the head and body of the pancreas, wrapping about the first and second segments of the duodenum, with mild involvement of the gastric antrum. Multiple rounded fluid collections are  identified at the pancreatic head and body, measuring 2.3 cm, 1.7 cm and 0.9 cm in size, likely reflecting small pseudocysts.  There is concern for mild devascularization at the pancreatic head.  There is associated focal wall edema along the second segment of the duodenum, with more diffuse wall thickening noted along the duodenum.  Soft tissue stranding and fluid track laterally and inferiorly along Gerota's fascia on the right side.  The liver and spleen are unremarkable in appearance.  The patient is status post cholecystectomy, with clips noted at the gallbladder fossa.  The common hepatic duct measures 0.9 cm, within normal limits status post cholecystectomy; minimal prominence of the intrahepatic biliary ducts likely also remains within normal limits.  The spleen is unremarkable in appearance.  The adrenal glands are within normal limits.  A few small hypodensities are noted within both kidneys, measuring up to 6 mm in size, likely reflecting small cysts.  Minimal nonspecific perinephric stranding is noted on the left side.  The kidneys are otherwise unremarkable in appearance.  There is no evidence of hydronephrosis.  No renal or ureteral stones are seen.  No free fluid is identified.  The small bowel is unremarkable in appearance.  A small to moderate hiatal hernia is noted, filled with contrast; trace adjacent fluid is nonspecific in appearance. The stomach is filled with contrast and is otherwise grossly unremarkable.  No acute vascular abnormalities are seen.  Scattered calcification is noted along the abdominal aorta and its branches.  The appendix is normal in caliber and filled with air, without evidence for appendicitis.  Minimal diverticulosis is noted at the splenic flexure of the colon, and mild diverticulosis is seen at the proximal sigmoid colon, without evidence of diverticulitis. The colon is otherwise unremarkable in appearance.  The bladder is mildly distended and grossly unremarkable.  The  uterus is within normal limits.  The ovaries are relatively symmetric; no suspicious adnexal masses are seen.  Trace fluid within the pelvis is likely physiologic in nature.  No inguinal lymphadenopathy is seen.  No acute osseous abnormalities are identified.  Vacuum phenomenon and disc space narrowing are noted at L4-L5.  IMPRESSION:  1.  Significant acute pancreatitis noted, involving the head and body of the pancreas. Multiple small pseudocysts noted at the pancreatic head and body, measuring up to 2.3 cm in size.  Likely mild devascularization of the pancreatic head. 2.  Marked associated soft tissue inflammation wraps about the first and second segments of the duodenum, with focal wall edema at the second segment of the duodenum.  Significant soft tissue stranding and  fluid track inferiorly and laterally along Gerota's fascia on the right side. 3.  Likely small bilateral renal cysts noted. 4.  Small to moderate hiatal hernia seen; trace nonspecific fluid noted adjacent to the hiatal hernia. 5.  Minimal diverticulosis at the splenic flexure of the colon, and mild diverticulosis of the proximal sigmoid colon, without evidence of diverticulitis. 6.  Mild bibasilar atelectasis.  Findings were discussed with Dr. Raeford Razor at 03:33 a.m. on 03/18/2011.  Original Report Authenticated By: Tonia Ghent, M.D.   Dg Chest Port 1 View  03/20/2011  *RADIOLOGY REPORT*  Clinical Data: Chest pain  PORTABLE CHEST - 1 VIEW  Comparison: Portable exam 1139 hours compared to 03/19/2011  Findings: Enlargement of cardiac silhouette. Mild pulmonary vascular congestion. Mediastinal contours normal. Decreased lung volumes with bibasilar atelectasis versus infiltrate. Probable small bibasilar effusions. Upper lungs clear. No pleural effusion or pneumothorax. Bones demineralized.  IMPRESSION: Enlargement of cardiac silhouette. Bibasilar atelectasis versus infiltrate. Aeration in left lower lobe is perhaps slightly increased versus  previous study.  Original Report Authenticated By: Lollie Marrow, M.D.   Dg Chest Port 1 View  03/19/2011  *RADIOLOGY REPORT*  Clinical Data: Fever and weakness.  PORTABLE CHEST - 1 VIEW  Comparison: 11/28/2010  Findings: Bilateral lower lobe infiltrates present, slightly more prominently on the left.  There likely are small bilateral pleural effusions, left greater than right.  No edema.  The heart size is normal.  IMPRESSION: Bilateral lower lobe infiltrates.  Original Report Authenticated By: Reola Calkins, M.D.   CXR 12/1 >Endotracheal tube 3.6 cm above the carina.  Bibasilar atelectasis, worse on the left.  Consults: Treatment Team:  Yancey Flemings, MD   Subjective:    Overnight Issues:  GI w/ bedside endo , intubated for airway protection/procedure  Objective:  Vital signs for last 24 hours: Temp:  [98.3 F (36.8 C)-99.6 F (37.6 C)] 98.4 F (36.9 C) (12/02 1212) Pulse Rate:  [6-103] 71  (12/02 1200) Resp:  [16-27] 17  (12/02 1200) BP: (77-133)/(43-71) 107/54 mmHg (12/02 1200) SpO2:  [94 %-100 %] 98 % (12/02 1200) FiO2 (%):  [39.8 %-40.3 %] 40 % (12/02 1200) Weight:  [75.9 kg (167 lb 5.3 oz)] 167 lb 5.3 oz (75.9 kg) (12/02 0200)  Hemodynamic parameters for last 24 hours: CVP:  [12 mmHg-289 mmHg] 14 mmHg  Intake/Output from previous day: 12/01 0701 - 12/02 0700 In: 5541.5 [I.V.:2804.5; Blood:612; IV Piggyback:1700] Out: 1005 [Urine:1005]  Intake/Output this shift: Total I/O In: 830.7 [I.V.:530.7; IV Piggyback:300] Out: 685 [Urine:685]  Vent settings for last 24 hours: Vent Mode:  [-] PRVC FiO2 (%):  [39.8 %-40.3 %] 40 % Set Rate:  [16 bmp] 16 bmp Vt Set:  [470 mL] 470 mL PEEP:  [5 cmH20] 5 cmH20 Pressure Support:  [5 cmH20] 5 cmH20 Plateau Pressure:  [14 cmH20-17 cmH20] 15 cmH20 Currently NOT on ventilator supports Stable at room air  Physical Exam:  General: Intubated/agitated on sedation  Head:    NCAT Eyes:      no jaundice, no discharge Ears:      Intact, no hearing deficit, no discharge Nose:   NG tube in place, no discharge, no congestion Throat:  Midline trachea, normal thyroid, no JVD/LAD Lungs:  Coarse BS no wheezing  CARD :  RRR, no murmurs/rubs/gallops Abdomen:   Hypoactive  slight distention      Skin:  No itching, no jaundice Neuro:  Agitated, follows commands   Assessment/Plan:   NEURO:   agitated,intubated for GI procedure On  sedation - ETOH -hx  Plan:   Hope to wean sedation and extubate in am   PULM:  ATX No respiratory distress present  CO2 is within normal limit CXR 12/2 > Slight increased perihilar and lower lobe edema and/or atelectasis.Small left effusion. Limit IVF, can't diurese with levophed  Plan :  Hope to extubate in am, agitation, mental status limiting extubation    Lab 03/22/11 0620 03/21/11 1257 03/20/11 1444  PHART 7.413* 7.498* 7.470*  PCO2ART 34.8* 31.4* 33.9*  PO2ART 78.0* 71.0* 71.0*  HCO3 22.2 24.4* 24.6*  TCO2 23 25 26   O2SAT 96.0 96.0 95.0       CARDIO:   Hypotension    pressors w/ levophed 12/1   Check cvp q4   Cont pressor support to goal map      RENAL:    No acute process  Will continue to monitor closely with emesis   BMET daily  Hypokalemia >replaced   Lab 03/22/11 0417 03/21/11 0418 03/20/11 1111 03/20/11 0706 03/19/11 0540  NA 136 138 136 137 134*  K 3.2* 3.3* -- -- --  CL 105 105 95* 95* 99  CO2 26 30 27 25 26   GLUCOSE 110* 135* 161* 119* 92  BUN 11 16 7 6 6   CREATININE 0.56 0.53 0.56 0.58 0.64  CALCIUM 7.6* 7.3* 8.9 9.1 8.5  MG -- -- -- -- --  PHOS -- -- -- -- --    GI: GI Bleeding  Cont protonix    NPO for now, NG tube suction, done  Appreciate GI consult     Pancreatitis from ETOH  Lipase at 1626  NPO  Change fluids to [facility] given volume up  See ID  ID:   Fever spike with elevation WBC, pancreatitis vs pneumonia  Cont abx   Follow cx data   Consider CT abdomen if WBC up, fever   HEME: Anemia:  Secondary to GI bleeding, stable  now  Follow cbc      Lab 03/22/11 0753 03/22/11 0417 03/22/11 0017  HGB 8.4* 8.3* 8.5*  HCT 24.8* 24.7* 24.9*  WBC 13.5* 13.4* 14.5*  PLT 212 214 205    ENDO: No acute process   GLOBAL ISSUES:   LOS: 5 days     Critical Care Total Time:  PARRETT,TAMMY NP PCCM  03/22/2011  Shyloh Krinke, Riley Lam

## 2011-03-23 ENCOUNTER — Other Ambulatory Visit: Payer: Self-pay

## 2011-03-23 ENCOUNTER — Inpatient Hospital Stay (HOSPITAL_COMMUNITY): Payer: Self-pay

## 2011-03-23 DIAGNOSIS — J9600 Acute respiratory failure, unspecified whether with hypoxia or hypercapnia: Secondary | ICD-10-CM

## 2011-03-23 DIAGNOSIS — Z9911 Dependence on respirator [ventilator] status: Secondary | ICD-10-CM

## 2011-03-23 DIAGNOSIS — F10231 Alcohol dependence with withdrawal delirium: Secondary | ICD-10-CM

## 2011-03-23 LAB — COMPREHENSIVE METABOLIC PANEL
ALT: 13 U/L (ref 0–35)
AST: 21 U/L (ref 0–37)
Albumin: 2 g/dL — ABNORMAL LOW (ref 3.5–5.2)
Alkaline Phosphatase: 80 U/L (ref 39–117)
BUN: 4 mg/dL — ABNORMAL LOW (ref 6–23)
Chloride: 107 mEq/L (ref 96–112)
Potassium: 3.5 mEq/L (ref 3.5–5.1)
Sodium: 139 mEq/L (ref 135–145)
Total Bilirubin: 0.5 mg/dL (ref 0.3–1.2)
Total Protein: 5.2 g/dL — ABNORMAL LOW (ref 6.0–8.3)

## 2011-03-23 LAB — CBC
HCT: 19 % — ABNORMAL LOW (ref 36.0–46.0)
Hemoglobin: 6.3 g/dL — CL (ref 12.0–15.0)
MCH: 33.5 pg (ref 26.0–34.0)
MCHC: 33.2 g/dL (ref 30.0–36.0)

## 2011-03-23 LAB — GLUCOSE, CAPILLARY
Glucose-Capillary: 87 mg/dL (ref 70–99)
Glucose-Capillary: 90 mg/dL (ref 70–99)
Glucose-Capillary: 88 mg/dL (ref 70–99)
Glucose-Capillary: 89 mg/dL (ref 70–99)

## 2011-03-23 LAB — CARDIAC PANEL(CRET KIN+CKTOT+MB+TROPI): CK, MB: 1.7 ng/mL (ref 0.3–4.0)

## 2011-03-23 MED ORDER — LORAZEPAM 2 MG/ML IJ SOLN
0.5000 mg | INTRAMUSCULAR | Status: DC | PRN
  Administered 2011-03-23 – 2011-03-24 (×2): 0.5 mg via INTRAVENOUS
  Administered 2011-03-25 – 2011-03-26 (×2): 1 mg via INTRAVENOUS
  Filled 2011-03-23 (×4): qty 1

## 2011-03-23 MED ORDER — FUROSEMIDE 10 MG/ML IJ SOLN
INTRAMUSCULAR | Status: AC
  Administered 2011-03-23: 40 mg via INTRAVENOUS
  Filled 2011-03-23: qty 4

## 2011-03-23 MED ORDER — POTASSIUM CHLORIDE 10 MEQ/50ML IV SOLN
INTRAVENOUS | Status: AC
  Administered 2011-03-23: 10 meq via INTRAVENOUS
  Filled 2011-03-23: qty 50

## 2011-03-23 MED ORDER — PANTOPRAZOLE SODIUM 40 MG IV SOLR
40.0000 mg | Freq: Two times a day (BID) | INTRAVENOUS | Status: DC
  Administered 2011-03-23 – 2011-03-24 (×3): 40 mg via INTRAVENOUS
  Filled 2011-03-23 (×4): qty 40

## 2011-03-23 MED ORDER — FENTANYL CITRATE 0.05 MG/ML IJ SOLN
12.5000 ug | INTRAMUSCULAR | Status: DC | PRN
  Administered 2011-03-24: 25 ug via INTRAVENOUS
  Administered 2011-03-24: 12.5 ug via INTRAVENOUS
  Administered 2011-03-25: 25 ug via INTRAVENOUS
  Filled 2011-03-23 (×3): qty 2

## 2011-03-23 MED ORDER — SODIUM CHLORIDE 0.9 % IJ SOLN
INTRAMUSCULAR | Status: AC
  Administered 2011-03-23: 10 mL
  Filled 2011-03-23: qty 30

## 2011-03-23 MED ORDER — NITROGLYCERIN 0.4 MG SL SUBL
SUBLINGUAL_TABLET | SUBLINGUAL | Status: AC
  Administered 2011-03-23: 0.4 mg via SUBLINGUAL
  Filled 2011-03-23: qty 25

## 2011-03-23 MED ORDER — FUROSEMIDE 10 MG/ML IJ SOLN
40.0000 mg | Freq: Once | INTRAMUSCULAR | Status: AC
  Administered 2011-03-23: 40 mg via INTRAVENOUS
  Filled 2011-03-23: qty 4

## 2011-03-23 MED ORDER — SODIUM CHLORIDE 0.9 % IJ SOLN
INTRAMUSCULAR | Status: AC
  Administered 2011-03-23: 10 mL
  Filled 2011-03-23: qty 20

## 2011-03-23 MED ORDER — POTASSIUM CHLORIDE 10 MEQ/50ML IV SOLN
10.0000 meq | INTRAVENOUS | Status: AC
  Administered 2011-03-23 (×3): 10 meq via INTRAVENOUS
  Filled 2011-03-23 (×2): qty 50

## 2011-03-23 NOTE — Progress Notes (Signed)
Chaplain Note:  Upon referral from pt nurse, chaplain visited with pt.  Pt was resting in bed and accepted chaplain's offer for visit.  Pt discussed life issues that fall in the realm of pastoral confidence.  Chaplain debriefed with pt nurse about pt's request to contact her son.  Chaplain will continue to follow.   03/23/11 1515  Clinical Encounter Type  Visited With Patient  Visit Type Initial;Spiritual support  Referral From Nurse  Spiritual Encounters  Spiritual Needs Emotional  Stress Factors  Patient Stress Factors Health changes;Family relationships;Loss of control  Family Stress Factors Family relationships    Jordon Bourquin, Ventura Bruns, chaplain resident [phone]

## 2011-03-23 NOTE — Progress Notes (Signed)
eLink Physician-Brief Progress Note Patient Name: [name] DOB: 09-21-1955 MRN: [id number]  Date of Service  03/23/2011   HPI/Events of Note   Atypical chest pain per RN. On and off. Some anxiety component. Last trop 11/30 negative. Relieved by nitro though  eICU Interventions  Repeat ekg (NSR on monitor) repeak ck, trop x 3 x q8h   Intervention Category Major Interventions: Electrolyte abnormality - evaluation and management Intermediate Interventions: Pain - evaluation and management  Partick Musselman 03/23/2011, 6:20 PM

## 2011-03-23 NOTE — Progress Notes (Signed)
Notified by RN of anxiety and agitation.  Complaining of chest pain, no chest pain.  Has prn haldol order but unable to give due to prolonged QTc.  Bedside eval shows some agitation and anxiety.    Will give low dose prn ativan for management of anxiety, monitor carefully for over sedation. Has history of alcohol abuse and prn ativan was used prior to intubation so this will likely be helpful.

## 2011-03-23 NOTE — Procedures (Signed)
Extubation Procedure Note  Patient Details:   Name: FELICIA BLOOMQUIST DOB: 1955-05-15 MRN: [id number]   Airway Documentation:     Evaluation  O2 sats: stable throughout Complications: No apparent complications Patient did tolerate procedure well. Bilateral Breath Sounds: Diminished Suctioning: Airway Yes  Pt extubated at this time per MD order to 4L [place] and tolerated well. No stridor noted. No complications noted. VS stable. HR 89, RR 17, SPo2 95% on 4L [place] and BP 125/64. Pt has adequate cough. RT will continue to monitor.   Loyal Jacobson [facility] 03/23/2011, 9:36 AM

## 2011-03-23 NOTE — Progress Notes (Signed)
Follow up - Critical Care Medicine Note  Patient Details:    [name] is an 55 y.o. 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  She is also noted to have a PMH of GERD, Rosacea, and elevated LFTs.  She was presented to the ED with abdominal pain and was subsequently admitted for acute exacerbation of pancreatitis on 03/18/11.  Earlier today, she has 2 episodes of coffee ground emesis.  She also has some agitation, complained of left side CP and epigastric pain.  Per internal medicine assessment she was also delirious.  Her care was transferred to [facility]. Acute increased GI bleeding.  Lines, Airways, Drains: 11/30 Left arm peripheral lines x 2 >>> 11/30 NG tube >>> 12/1 ETT >> 12/3  Anti-infectives:  11/29 Primaxin >>> 12/3 11/30 vanc>>> 12/3  Microbiology: 08/09 MRSA screening >>> Negative 11/29 Blood culture >>> Pending 11/29 Urine culture >>> Pending  Best Practice/Protocols:  GI prophylaxis:  Protonix 03/18/11 >>>  Events: 11/30 Increase delirium, left side chest pain, and coffee ground emesis in AM  Large amount maroon emesis around 3PM 12/1 bedside endo per gI > Severe erosive esophagitis and Mallory-Weiss tear. Epi injection  Studies: Ct Abdomen Pelvis W Contrast: IMPRESSION:  1.  Significant acute pancreatitis noted, involving the head and body of the pancreas. Multiple small pseudocysts noted at the pancreatic head and body, measuring up to 2.3 cm in size.  Likely mild devascularization of the pancreatic head. 2.  Marked associated soft tissue inflammation wraps about the first and second segments of the duodenum, with focal wall edema at the second segment of the duodenum.  Significant soft tissue stranding and fluid track inferiorly and laterally along Gerota's fascia on the right side. 3.  Likely small bilateral renal cysts noted. 4.  Small to moderate hiatal hernia seen; trace nonspecific fluid noted  adjacent to the hiatal hernia. 5.  Minimal diverticulosis at the splenic flexure of the colon, and mild diverticulosis of the proximal sigmoid colon, without evidence of diverticulitis. 6.  Mild bibasilar atelectasis.    Consults: GI Marina Goodell): 12/1 > 12/3  Subjective:    Overnight Issues:  None. Passed SBT this AM and extubated. Looks good post extubation  Objective:  Vital signs for last 24 hours: Temp:  [98.6 F (37 C)-99.6 F (37.6 C)] 99 F (37.2 C) (12/03 1200) Pulse Rate:  [62-98] 97  (12/03 1600) Resp:  [11-25] 21  (12/03 1600) BP: (86-136)/(46-66) 114/53 mmHg (12/03 1600) SpO2:  [93 %-100 %] 93 % (12/03 1600) FiO2 (%):  [39.8 %-40.2 %] 39.8 % (12/03 0906) Weight:  [76.7 kg (169 lb 1.5 oz)] 169 lb 1.5 oz (76.7 kg) (12/03 0415)  Intake/Output from previous day: 12/02 0701 - 12/03 0700 In: 3558.5 [I.V.:2658.5; IV Piggyback:900] Out: 2520 [Urine:2520]  Intake/Output this shift: Total I/O In: 704.5 [I.V.:354.5; IV Piggyback:350] Out: 2725 [Urine:2725]  Physical Exam:  General: NAD  Head:    NCAT, NAD Eyes:    no jaundice, no discharge Ears:   Intact, no hearing deficit, no discharge Nose:   NG tube in place, no discharge, no congestion Throat:  Midline trachea, normal thyroid, no JVD/LAD Lungs:  Coarse BS no wheezing  CARD :  RRR, no murmurs/rubs/gallops Abdomen:   Hypoactive  slight distention      Skin:  No itching, no jaundice Neuro:  Agitated, follows commands   Assessment/Plan:   NEURO:  Much improved. Minimize sedation  PULM: Extubated and tolerating  CARDIO:  Hypotension resolved     RENAL:  No acute process. Hypokalemia resolved   UGI Bleeding due to erosive esophagitis and Mallory Weiss tear  Not actively bleeding now  Cont protonix lifelong    Pancreatitis from ETOH  Lipase at 1626 initially >> 172. Clinically, unimpressive exam  ID:   Fever spike with elevation WBC: doubt active bacterial process. D/C abx 12/3   HEME: Acute blood loss  anemia - not actively bleeding. No indication for transfusion presently   LOS: 6 days     Critical Care Total Time: 35  Billy Fischer MD PCCM  03/23/2011  Billy Fischer

## 2011-03-23 NOTE — Progress Notes (Signed)
STABLE W/O FURTHER GI BLEEDING. CONTINUE PPI INDEFINITELY (FOR EROSIVE ESOPHAGITIS). WILL SIGN OFF... JNP

## 2011-03-24 ENCOUNTER — Inpatient Hospital Stay (HOSPITAL_COMMUNITY): Payer: Self-pay

## 2011-03-24 DIAGNOSIS — K2901 Acute gastritis with bleeding: Secondary | ICD-10-CM

## 2011-03-24 DIAGNOSIS — F10231 Alcohol dependence with withdrawal delirium: Secondary | ICD-10-CM

## 2011-03-24 DIAGNOSIS — J9600 Acute respiratory failure, unspecified whether with hypoxia or hypercapnia: Secondary | ICD-10-CM

## 2011-03-24 LAB — CARDIAC PANEL(CRET KIN+CKTOT+MB+TROPI)
CK, MB: 1.5 ng/mL (ref 0.3–4.0)
CK, MB: 1.6 ng/mL (ref 0.3–4.0)
Relative Index: INVALID (ref 0.0–2.5)
Total CK: 65 U/L (ref 7–177)

## 2011-03-24 LAB — BASIC METABOLIC PANEL
BUN: 6 mg/dL (ref 6–23)
Chloride: 104 mEq/L (ref 96–112)
Creatinine, Ser: 0.54 mg/dL (ref 0.50–1.10)
GFR calc Af Amer: >90 mL/min (ref >90)
GFR calc non Af Amer: >90 mL/min (ref >90)

## 2011-03-24 LAB — TYPE AND SCREEN
Unit division: 0
Unit division: 0

## 2011-03-24 LAB — GLUCOSE, CAPILLARY
Glucose-Capillary: 85 mg/dL (ref 70–99)
Glucose-Capillary: 114 mg/dL — ABNORMAL HIGH (ref 70–99)

## 2011-03-24 LAB — CBC
HCT: 24.9 % — ABNORMAL LOW (ref 36.0–46.0)
MCHC: 33.3 g/dL (ref 30.0–36.0)
RDW: 15 % (ref 11.5–15.5)

## 2011-03-24 LAB — PROCALCITONIN: Procalcitonin: 0.15 ng/mL

## 2011-03-24 MED ORDER — SODIUM CHLORIDE 0.9 % IJ SOLN
INTRAMUSCULAR | Status: AC
  Administered 2011-03-24: 18:00:00
  Filled 2011-03-24: qty 20

## 2011-03-24 MED ORDER — BENZONATATE 100 MG PO CAPS
100.0000 mg | ORAL_CAPSULE | Freq: Three times a day (TID) | ORAL | Status: DC | PRN
  Administered 2011-03-25 (×3): 100 mg via ORAL
  Filled 2011-03-24 (×3): qty 1

## 2011-03-24 MED ORDER — SODIUM CHLORIDE 0.9 % IJ SOLN
INTRAMUSCULAR | Status: AC
  Administered 2011-03-24: 10 mL
  Filled 2011-03-24: qty 20

## 2011-03-24 MED ORDER — PANTOPRAZOLE SODIUM 40 MG PO TBEC
40.0000 mg | DELAYED_RELEASE_TABLET | Freq: Two times a day (BID) | ORAL | Status: DC
  Administered 2011-03-24 – 2011-03-26 (×4): 40 mg via ORAL
  Filled 2011-03-24 (×4): qty 1

## 2011-03-24 NOTE — Progress Notes (Signed)
eLink Physician-Brief Progress Note Patient Name: ANDILYNN DELAVEGA DOB: [birth year] MRN: [id number]  Date of Service  03/24/2011   HPI/Events of Note   cough  eICU Interventions  Order for tessalon tid prn cough   Intervention Category Major Interventions: Electrolyte abnormality - evaluation and management Intermediate Interventions: Pain - evaluation and management Minor Interventions: Routine modifications to care plan (e.g. PRN medications for pain, fever)  Charmelle Soh 03/24/2011, 11:33 PM

## 2011-03-24 NOTE — Progress Notes (Signed)
Physical Therapy Evaluation Patient Details Name: [name] MRN: [id number] DOB: Nov 27, 1955 Today's Date: 03/24/2011  Problem List:  Patient Active Problem List  Diagnoses  . ANXIETY  . DEPRESSION  . ESOPHAGEAL MOTILITY DISORDER  . HIATAL HERNIA  . FATTY LIVER DISEASE  . CHEST PAIN, NON-CARDIAC  . DIARRHEA  . ALCOHOL ABUSE, HX OF  . Chronic diarrhea  . Mouth dryness  . Alcohol abuse  . Pancreatitis  . Elevated liver function tests  . GERD (gastroesophageal reflux disease)  . GI bleed  . Delirium tremens    Past Medical History:  Past Medical History  Diagnosis Date  . GERD (gastroesophageal reflux disease)   . Rosacea   . Elevated liver function tests   . Wears glasses   . Chronic diarrhea   . Pancreatitis 01/2010    2011 admission was second admission, 3rd in 02/2011  . Alcohol abuse     H/o withdrawal    Past Surgical History:  Past Surgical History  Procedure Date  . Cholecystectomy   . Shoulder surgery [id number]  . Diagnostic mammogram 2008  . Tubal ligation   . Colonoscopy Never    PT Assessment/Plan/Recommendation PT Assessment Clinical Impression Statement: pt is a 55 y/o female admitted with GIB and in need of acute PT and likely HHPT to address issues of weakness and activity tolerance as well as mild balance and gait unsteadiness PT Recommendation/Assessment: Patient will need skilled PT in the acute care venue PT Problem List: Decreased strength;Decreased activity tolerance;Decreased balance;Decreased mobility Barriers to Discharge: Decreased caregiver support PT Therapy Diagnosis : Generalized weakness PT Plan PT Frequency: Min 3X/week PT Treatment/Interventions: Gait training;Stair training;Functional mobility training;Therapeutic activities;Balance training;Patient/family education PT Recommendation Follow Up Recommendations: Home health PT Equipment Recommended: None recommended by PT PT Goals  Acute Rehab PT Goals PT Goal  Formulation: With patient Time For Goal Achievement: 7 days Pt will go Supine/Side to Sit: Independently PT Goal: Supine/Side to Sit - Progress: Other (comment) Pt will go Sit to Stand: Independently PT Goal: Sit to Stand - Progress: Other (comment) Pt will Transfer Bed to Chair/Chair to Bed: Independently PT Transfer Goal: Bed to Chair/Chair to Bed - Progress: Other (comment) Pt will Ambulate: >150 feet;with modified independence;with least restrictive assistive device PT Goal: Ambulate - Progress: Other (comment) Pt will Go Up / Down Stairs: 1-2 stairs;with modified independence;with rail(s) PT Goal: Up/Down Stairs - Progress: Other (comment)  PT Evaluation Precautions/Restrictions    Prior Functioning  Home Living Lives With: Alone Receives Help From: Friend(s) (will look into getting a friend to [place] as needed) Type of Home: House Home Layout: One level;Able to live on main level with bedroom/bathroom Home Access: Stairs to enter Entrance Stairs-Number of Steps: 2 Bathroom Shower/Tub: Engineer, manufacturing systems: Standard Home Adaptive Equipment: None Prior Function Level of Independence: Independent with transfers;Independent with gait;Independent with homemaking with wheelchair;Independent with homemaking with ambulation;Independent with basic ADLs Able to Take Stairs?: Yes Driving: Yes Vocation: Full time employment Cognition Cognition Arousal/Alertness: Awake/alert Overall Cognitive Status: Appears within functional limits for tasks assessed Orientation Level: Oriented X4 Sensation/Coordination Coordination Gross Motor Movements are Fluid and Coordinated: Yes Fine Motor Movements are Fluid and Coordinated: Yes Extremity Assessment RUE Assessment RUE Assessment: Within Functional Limits LUE Assessment LUE Assessment: Within Functional Limits RLE Assessment RLE Assessment: Within Functional Limits LLE Assessment LLE Assessment: Within Functional Limits (bil LE's  generally weak but functional) Mobility (including Balance) Bed Mobility Bed Mobility: Yes Supine to Sit: 6: Modified independent (Device/Increase time)  Sitting - Scoot to Edge of Bed: 7: Independent Transfers Transfers: Yes Sit to Stand: 5: Supervision;With upper extremity assist;From bed Sit to Stand Details (indicate cue type and reason): no cues needed Stand to Sit: 5: Supervision;To bed Ambulation/Gait Ambulation/Gait: Yes Ambulation/Gait Assistance: Other (comment) (min guard assist) Ambulation Distance (Feet): 150 Feet Assistive device: Other (Comment) (pushing IV pole) Gait Pattern: Within Functional Limits (mildly unsteady, slightly worse withou A dev., more guarded) Stairs: No  Posture/Postural Control Posture/Postural Control: No significant limitations Balance Balance Assessed: Yes (mildly unsteady due to generally weakness) Exercise    End of Session PT - End of Session Activity Tolerance: Patient tolerated treatment well Patient left: in bed Nurse Communication: Mobility status for ambulation General Behavior During Session: [facility] for tasks performed Cognition: [name] for tasks performed  [name], [name] 03/24/2011, 12:04 PM  03/24/2011  [name], PT [phone] (pager)

## 2011-03-24 NOTE — Progress Notes (Signed)
Clinical Social Worker met with pt and friend at bedside.  CSW introduced self and explained role.  CSW discussed that pt is transferring to another service line, CSW explained that another CSW will sign on to assist pt with psychosocial needs.  Pt agreeable to this plan.  This CSW signing off.  Angelia Mould, MSW, [place] [phone]

## 2011-03-24 NOTE — Progress Notes (Signed)
Follow up - Critical Care Medicine Note  Patient Details:    [name] is an 55 y.o. 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  She is also noted to have a PMH of GERD, Rosacea, and elevated LFTs.  She was presented to the ED with abdominal pain and was subsequently admitted for acute exacerbation of pancreatitis on 03/18/11. On 11/30, she has 2 episodes of coffee ground emesis and was transferred to ICU under PCCM service.She required intubation for EGD 12/1 due to extreme agitation. Successfully extubated 12/3 Lines, Airways, Drains: 11/30   R IJ CVL >> 12/4 11/30 NG tube >>> 12/3 12/1     ETT >> 12/3  Anti-infectives:  11/29 Primaxin >>> 12/3 11/30 vanc>>> 12/3  Microbiology: 08/09 MRSA screening >>> Negative 11/29 Blood culture >>>NEG 11/29 Urine culture >>> NEG  Best Practice/Protocols:  GI prophylaxis:  Protonix 03/18/11 >>   Events: 11/30 Increase delirium, left side chest pain, and coffee ground emesis in AM  12/1 bedside endo per GI  > Severe erosive esophagitis and Mallory-Weiss tear. Epi injection  Studies: Ct Abdomen Pelvis W Contrast: IMPRESSION:  1.  Significant acute pancreatitis noted, involving the head and body of the pancreas. Multiple small pseudocysts noted at the pancreatic head and body, measuring up to 2.3 cm in size.  Likely mild devascularization of the pancreatic head. 2.  Marked associated soft tissue inflammation wraps about the first and second segments of the duodenum, with focal wall edema at the second segment of the duodenum.  Significant soft tissue stranding and fluid track inferiorly and laterally along Gerota's fascia on the right side. 3.  Likely small bilateral renal cysts noted. 4.  Small to moderate hiatal hernia seen; trace nonspecific fluid noted adjacent to the hiatal hernia. 5.  Minimal diverticulosis at the splenic flexure of the colon, and mild diverticulosis of the  proximal sigmoid colon, without evidence of diverticulitis. 6.  Mild bibasilar atelectasis.    Consults: GI Marina Goodell): 12/1 > 12/3  Subjective:    Overnight Issues:  Calm, appropriate, no distress, no new complaints  Objective:  Vital signs for last 24 hours: Temp:  [98 F (36.7 C)-100.5 F (38.1 C)] 98.3 F (36.8 C) (12/04 0800) Pulse Rate:  [25-97] 85  (12/04 1000) Resp:  [11-25] 24  (12/04 1000) BP: (92-136)/(49-72) 118/72 mmHg (12/04 1000) SpO2:  [90 %-100 %] 98 % (12/04 1000) Weight:  [74.3 kg (163 lb 12.8 oz)] 163 lb 12.8 oz (74.3 kg) (12/04 0522)  Intake/Output from previous day: 12/03 0701 - 12/04 0700 In: 1314.5 [I.V.:824.5; IV Piggyback:490] Out: 3495 [Urine:3495]  Intake/Output this shift: Total I/O In: 130 [I.V.:60; IV Piggyback:70] Out: 120 [Urine:120]  Physical Exam:  General: NAD  Head:    NCAT, NAD Eyes:    no jaundice, no discharge Ears:   Intact, no hearing deficit, no discharge Nose:   NG tube in place, no discharge, no congestion Throat:  Midline trachea, normal thyroid, no JVD/LAD Lungs:  Coarse BS no wheezing  CARD :  RRR, no murmurs/rubs/gallops Abdomen:   Hypoactive  slight distention      Skin:  No itching, no jaundice Neuro:  Agitated, follows commands   Assessment/Plan:   NEURO: Delirium resolved  PULM: Resp failure, resolved  CARDIO: Hypotension resolved     RENAL:  No acute process. Hypokalemia resolved   UGI Bleeding due to erosive esophagitis and Mallory Weiss tear  Not actively bleeding now  Cont protonix  lifelong per GI    Pancreatitis from ETOH  Lipase at 1626 initially >> 172. Clinically, unimpressive exam. No need for abx  ID:   No evidence of active bacterial process. Off abx   HEME: Acute blood loss anemia - not actively bleeding presently.    Discussed with Dr Butler Denmark. Pt is stable for transfer to reg med-surg bed. Will transfer to service of [facility] beginning AM 12/5    LOS: 7 days    Billy Fischer MD PCCM    03/24/2011  Billy Fischer

## 2011-03-25 DIAGNOSIS — J9690 Respiratory failure, unspecified, unspecified whether with hypoxia or hypercapnia: Secondary | ICD-10-CM | POA: Diagnosis not present

## 2011-03-25 LAB — GLUCOSE, CAPILLARY: Glucose-Capillary: 125 mg/dL — ABNORMAL HIGH (ref 70–99)

## 2011-03-25 MED ORDER — CLONAZEPAM 0.5 MG PO TABS
0.5000 mg | ORAL_TABLET | Freq: Two times a day (BID) | ORAL | Status: DC
  Administered 2011-03-25 – 2011-03-26 (×3): 0.5 mg via ORAL
  Filled 2011-03-25 (×3): qty 1

## 2011-03-25 NOTE — Progress Notes (Signed)
PATIENT DETAILS Name: [name] Age: 55 y.o. Sex: female Date of Birth: 08-02-1955 Admit Date: 03/17/2011 PCP:No primary provider on file.  Subjective: Feels much better, lingering cough.  Objective: Vital signs in last 24 hours: Filed Vitals:   03/25/11 0600 03/25/11 0800 03/25/11 0900 03/25/11 1200  BP: 113/65 118/74    Pulse:  82 129   Temp:  99.2 F (37.3 C)  99.1 F (37.3 C)  TempSrc:  Oral  Oral  Resp: 22 21 27    Height:      Weight:      SpO2:  94% 57%     Weight change:   Body mass index is 26.44 kg/(m^2).  Intake/Output from previous day:  Intake/Output Summary (Last 24 hours) at 03/25/11 1359 Last data filed at 03/25/11 0800  Gross per 24 hour  Intake    258 ml  Output      6 ml  Net    252 ml    PHYSICAL EXAM: Gen Exam: Awake and alert with clear speech.   Neck: Supple, No JVD.   Chest: B/L Clear.   CVS: S1 S2 Regular, no murmurs.  Abdomen: soft, BS +, non tender, non distended.  Extremities: no edema, lower extremities warm to touch. Neurologic: Non Focal.   Skin: No Rash.   Wounds: N/A.    CONSULTS:  pulmonary/intensive care GI  LAB RESULTS: CBC  Lab 03/24/11 0421 03/22/11 0753 03/22/11 0417 03/22/11 0017 03/21/11 2017  WBC 9.3 13.5* 13.4* 14.5* 15.9*  HGB 8.3* 8.4* 8.3* 8.5* 8.5*  HCT 24.9* 24.8* 24.7* 24.9* 25.2*  PLT 298 212 214 205 196  MCV 99.2 97.6 98.0 98.0 97.3  MCH 33.1 33.1 32.9 33.5 32.8  MCHC 33.3 33.9 33.6 34.1 33.7  RDW 15.0 17.1* 17.0* 17.1* 17.2*  LYMPHSABS -- -- -- -- --  MONOABS -- -- -- -- --  EOSABS -- -- -- -- --  BASOSABS -- -- -- -- --  BANDABS -- -- -- -- --    Chemistries   Lab 03/24/11 0421 03/23/11 0417 03/22/11 0417 03/21/11 0418 03/20/11 1111  NA 139 139 136 138 136  K 3.4* 3.5 3.2* 3.3* 3.6  CL 104 107 105 105 95*  CO2 25 25 26 30 27   GLUCOSE 81 95 110* 135* 161*  BUN 6 4* 11 16 7   CREATININE 0.54 0.54 0.56 0.53 0.56  CALCIUM 8.0* 8.0* 7.6* 7.3* 8.9  MG -- -- -- -- --     GFR Estimated Creatinine Clearance: 80.9 ml/min (by C-G formula based on Cr of 0.54).  Coagulation profile  Lab 03/20/11 2050  INR 1.11  PROTIME --    Cardiac Enzymes  Lab 03/24/11 0605 03/24/11 0024 03/23/11 1820  CKMB 1.6 1.5 1.7  TROPONINI <0.30 <0.30 <0.30  MYOGLOBIN -- -- --    No results found for this basename: POCBNP:3 in the last 168 hours No results found for this basename: DDIMER:2 in the last 72 hours No results found for this basename: HGBA1C:2 in the last 72 hours No results found for this basename: CHOL:2,HDL:2,LDLCALC:2,TRIG:2,CHOLHDL:2,LDLDIRECT:2 in the last 72 hours No results found for this basename: TSH,T4TOTAL,FREET3,T3FREE,THYROIDAB in the last 72 hours No results found for this basename: VITAMINB12:2,FOLATE:2,FERRITIN:2,TIBC:2,IRON:2,RETICCTPCT:2 in the last 72 hours No results found for this basename: LIPASE:2,AMYLASE:2 in the last 72 hours  Urine Studies No results found for this basename: UACOL:2,UAPR:2,USPG:2,UPH:2,UTP:2,UGL:2,UKET:2,UBIL:2,UHGB:2,UNIT:2,UROB:2,ULEU:2,UEPI:2,UWBC:2,URBC:2,UBAC:2,CAST:2,CRYS:2,UCOM:2,BILUA:2 in the last 72 hours  MICROBIOLOGY: Recent Results (from the past 240 hour(s))  CULTURE, BLOOD (ROUTINE X 2)  Status: Normal (Preliminary result)   Collection Time   03/19/11  8:11 PM      Component Value Range Status Comment   Specimen Description BLOOD RIGHT ARM   Final    Special Requests BOTTLES DRAWN AEROBIC AND ANAEROBIC 10CC   Final    Setup Time [id number]   Final    Culture     Final    Value:        BLOOD CULTURE RECEIVED NO GROWTH TO DATE CULTURE WILL BE HELD FOR 5 DAYS BEFORE ISSUING A FINAL NEGATIVE REPORT   Report Status PENDING   Incomplete   CULTURE, BLOOD (ROUTINE X 2)     Status: Normal (Preliminary result)   Collection Time   03/19/11  8:11 PM      Component Value Range Status Comment   Specimen Description BLOOD LEFT HAND   Final    Special Requests BOTTLES DRAWN AEROBIC ONLY 10CC   Final     Setup Time [id number]   Final    Culture     Final    Value:        BLOOD CULTURE RECEIVED NO GROWTH TO DATE CULTURE WILL BE HELD FOR 5 DAYS BEFORE ISSUING A FINAL NEGATIVE REPORT   Report Status PENDING   Incomplete   MRSA PCR SCREENING     Status: Normal   Collection Time   03/20/11  5:42 PM      Component Value Range Status Comment   MRSA by PCR NEGATIVE  NEGATIVE  Final     RADIOLOGY STUDIES/RESULTS: Ct Abdomen Pelvis W Contrast  03/18/2011  *RADIOLOGY REPORT*  Clinical Data: Mid abdominal pain, with nausea and vomiting.  CT ABDOMEN AND PELVIS WITH CONTRAST  Technique:  Multidetector CT imaging of the abdomen and pelvis was performed following the standard protocol during bolus administration of intravenous contrast.  Contrast: OMNIPAQUE IOHEXOL 300 MG/ML IV SOLN  Comparison: CT of the abdomen and pelvis, and abdominal ultrasound, performed 11/27/2010  Findings: Mild bibasilar atelectasis is noted.  There is marked soft tissue inflammation noted about the head and body of the pancreas, wrapping about the first and second segments of the duodenum, with mild involvement of the gastric antrum. Multiple rounded fluid collections are identified at the pancreatic head and body, measuring 2.3 cm, 1.7 cm and 0.9 cm in size, likely reflecting small pseudocysts.  There is concern for mild devascularization at the pancreatic head.  There is associated focal wall edema along the second segment of the duodenum, with more diffuse wall thickening noted along the duodenum.  Soft tissue stranding and fluid track laterally and inferiorly along Gerota's fascia on the right side.  The liver and spleen are unremarkable in appearance.  The patient is status post cholecystectomy, with clips noted at the gallbladder fossa.  The common hepatic duct measures 0.9 cm, within normal limits status post cholecystectomy; minimal prominence of the intrahepatic biliary ducts likely also remains within normal limits.  The  spleen is unremarkable in appearance.  The adrenal glands are within normal limits.  A few small hypodensities are noted within both kidneys, measuring up to 6 mm in size, likely reflecting small cysts.  Minimal nonspecific perinephric stranding is noted on the left side.  The kidneys are otherwise unremarkable in appearance.  There is no evidence of hydronephrosis.  No renal or ureteral stones are seen.  No free fluid is identified.  The small bowel is unremarkable in appearance.  A small to moderate hiatal hernia is  noted, filled with contrast; trace adjacent fluid is nonspecific in appearance. The stomach is filled with contrast and is otherwise grossly unremarkable.  No acute vascular abnormalities are seen.  Scattered calcification is noted along the abdominal aorta and its branches.  The appendix is normal in caliber and filled with air, without evidence for appendicitis.  Minimal diverticulosis is noted at the splenic flexure of the colon, and mild diverticulosis is seen at the proximal sigmoid colon, without evidence of diverticulitis. The colon is otherwise unremarkable in appearance.  The bladder is mildly distended and grossly unremarkable.  The uterus is within normal limits.  The ovaries are relatively symmetric; no suspicious adnexal masses are seen.  Trace fluid within the pelvis is likely physiologic in nature.  No inguinal lymphadenopathy is seen.  No acute osseous abnormalities are identified.  Vacuum phenomenon and disc space narrowing are noted at L4-L5.  IMPRESSION:  1.  Significant acute pancreatitis noted, involving the head and body of the pancreas. Multiple small pseudocysts noted at the pancreatic head and body, measuring up to 2.3 cm in size.  Likely mild devascularization of the pancreatic head. 2.  Marked associated soft tissue inflammation wraps about the first and second segments of the duodenum, with focal wall edema at the second segment of the duodenum.  Significant soft tissue  stranding and fluid track inferiorly and laterally along Gerota's fascia on the right side. 3.  Likely small bilateral renal cysts noted. 4.  Small to moderate hiatal hernia seen; trace nonspecific fluid noted adjacent to the hiatal hernia. 5.  Minimal diverticulosis at the splenic flexure of the colon, and mild diverticulosis of the proximal sigmoid colon, without evidence of diverticulitis. 6.  Mild bibasilar atelectasis.  Findings were discussed with Dr. Raeford Razor at 03:33 a.m. on 03/18/2011.  Original Report Authenticated By: Tonia Ghent, M.D.   Dg Chest Port 1 View  03/24/2011  *RADIOLOGY REPORT*  Clinical Data: Follow up respiratory failure  PORTABLE CHEST - 1 VIEW  Comparison: 03/23/2011  Findings: Right IJ catheter tip is in the projection of the lower SVC.  The heart size is normal.  Similar appearance of the bilateral, posterior layering effusions.  IMPRESSION:  1.  No change in bilateral pleural effusions  likely due to CHF.  Original Report Authenticated By: Rosealee Albee, M.D.   Dg Chest Port 1 View  03/23/2011  *RADIOLOGY REPORT*  Clinical Data: Intubated.  Check endotracheal tube position.  PORTABLE CHEST - 1 VIEW  Comparison: 03/22/2011.  Findings: Endotracheal tube terminates 3.3 cm above the carina. Right IJ central line tip projects over the SVC.  Heart size stable.  There is diffuse bilateral air space disease with collapse/consolidation in the left lower lobe.  Bilateral pleural effusions.  IMPRESSION: Probable congestive heart failure with left lower lobe collapse/consolidation.  Original Report Authenticated By: Reyes Ivan, M.D.   Dg Chest Port 1 View  03/22/2011  *RADIOLOGY REPORT*  Clinical Data: Intubated.  PORTABLE CHEST - 1 VIEW  Comparison: 03/21/2011  Findings: Cardiomegaly with bilateral perihilar and lower lobe opacities, slightly increased since prior study, presumably atelectasis although edema cannot be excluded.  Small left effusion.  Support devices are  unchanged.  IMPRESSION: Slight increased perihilar and lower lobe edema and/or atelectasis.  Small left effusion.  Original Report Authenticated By: Cyndie Chime, M.D.   Dg Chest Port 1 View  03/21/2011  *RADIOLOGY REPORT*  Clinical Data: Intubation  PORTABLE CHEST - 1 VIEW  Comparison: 03/20/2011  Findings: Endotracheal tube 3.6  cm above the carina.  Stable right IJ central line tip in the SVC region.  NG tube has been removed. Telemetry leads overlie the chest.  Exam is limited with some motion artifact.  Stable lung volumes with residual atelectasis, worse in the left lower lobe.  Small effusions evident without interval enlargement.  No pneumothorax.  Increased gastric distention with air.  IMPRESSION: Endotracheal tube 3.6 cm above the carina.  Bibasilar atelectasis, worse on the left.  Increased gastric distention  Original Report Authenticated By: Judie Petit. Ruel Favors, M.D.   Dg Chest Port 1 View  03/20/2011  *RADIOLOGY REPORT*  Clinical Data: Right IJ catheter placement.  PORTABLE CHEST - 1 VIEW  Comparison: Chest x-ray 03/20/2011.  Findings: The right IJ central venous catheter tip is in the mid distal SVC approximately 3 cm above the cavoatrial junction.  No complicating features.  An NG tube is coursing down into the stomach.  Persistent low lung volumes with vascular crowding and bibasilar atelectasis.  Small effusions are also suspected.  IMPRESSION: Right IJ central venous catheter in good position without complicating features.  Original Report Authenticated By: P. Loralie Champagne, M.D.   Dg Chest Port 1 View  03/20/2011  *RADIOLOGY REPORT*  Clinical Data: Chest pain  PORTABLE CHEST - 1 VIEW  Comparison: Portable exam 1139 hours compared to 03/19/2011  Findings: Enlargement of cardiac silhouette. Mild pulmonary vascular congestion. Mediastinal contours normal. Decreased lung volumes with bibasilar atelectasis versus infiltrate. Probable small bibasilar effusions. Upper lungs clear. No pleural  effusion or pneumothorax. Bones demineralized.  IMPRESSION: Enlargement of cardiac silhouette. Bibasilar atelectasis versus infiltrate. Aeration in left lower lobe is perhaps slightly increased versus previous study.  Original Report Authenticated By: Lollie Marrow, M.D.   Dg Chest Port 1 View  03/19/2011  *RADIOLOGY REPORT*  Clinical Data: Fever and weakness.  PORTABLE CHEST - 1 VIEW  Comparison: 11/28/2010  Findings: Bilateral lower lobe infiltrates present, slightly more prominently on the left.  There likely are small bilateral pleural effusions, left greater than right.  No edema.  The heart size is normal.  IMPRESSION: Bilateral lower lobe infiltrates.  Original Report Authenticated By: Reola Calkins, M.D.    MEDICATIONS: Scheduled Meds:   . pantoprazole  40 mg Oral BID AC  . sodium chloride       Continuous Infusions:   . 0.9 % NaCl with KCl 20 mEq / L 10 mL/hr at 03/25/11 0800   PRN Meds:.acetaminophen, acetaminophen, benzonatate, fentaNYL, LORazepam, ondansetron (ZOFRAN) IV, ondansetron  Antibiotics: Anti-infectives     Start     Dose/Rate Route Frequency Ordered Stop   03/21/11 0100   vancomycin (VANCOCIN) 750 mg in sodium chloride 0.9 % 150 mL IVPB  Status:  Discontinued        750 mg 150 mL/hr over 60 Minutes Intravenous Every 8 hours 03/20/11 1647 03/23/11 0932   03/20/11 1700   vancomycin (VANCOCIN) IVPB 1000 mg/200 mL premix     Comments: STAT per pharamcy      1,000 mg 200 mL/hr over 60 Minutes Intravenous Every 12 hours 03/20/11 1620 03/20/11 1827   03/19/11 2200   imipenem-cilastatin (PRIMAXIN) 500 mg in sodium chloride 0.9 % 100 mL IVPB  Status:  Discontinued        500 mg 200 mL/hr over 30 Minutes Intravenous 3 times per day 03/19/11 1829 03/23/11 0932          Assessment/Plan: Patient Active Hospital Problem List: Pancreatitis    Assessment: secondary to ETOH, currently  resolved   Plan: monitor, tolerating diet,  GI bleed    Assessment:  resolved. Secondary to [employer] Tear and Erosive Esophagitis   Plan: continue with PPI  ANXIETY    Assessment: still very anxious.   Plan: Start Klonopin  DEPRESSION    Assessment: stable, without suicidal or homicidal ideation   Plan: will get psych to evaluate   ALCOHOL ABUSE, HX OF  Assessment: currently no signs of withdrawal,too far out anyways   Plan: counselled extensively.  Respiratory failure    Assessment: S/P VDRF-EXTUBATED 03/23/11    Plan: Monitor, O2 as tolerated.  Delirium tremens    Assessment: no signs of withdrwal   Plan: monitor, counselled extensively   Disposition: D/c home in am  DVT Prophylaxis: No lovenox/heparin-given GI Bleed  Code Status: Full code  Maretta Bees, fifth  MD. 03/25/2011, 1:59 PM

## 2011-03-25 NOTE — Progress Notes (Signed)
Clinical Social Work Psychiatry  Assessment    Presenting Symptoms/Problems: CSW met with patient at bedside for psych consult.  Patient reports she was admitted for history of alcohol abuse causing medical problems, but also feeling depressed/anxious.   Psychiatric History: Patient reports a recent admission into [facility] under the care of Dr. Allena Katz for SA and depression.  From [facility] she was admitted to [facility] where she reports was for more mental health related issues.  Patient reports not currently seeing a provider or taking any medications.  She has only had one admission into mental health hospital.  Is interested in setting up outpatient counseling for SA and depression.    Family Collateral Information: No family present at this time.  Patient reports difficult family dynamics in which her mother was recently placed into a nursing home for advanced dementia.  Patient reports this was very hard for her because she was a the sole caregiver.  Patient reports estrangement from her 2 sisters after mother was placed in NH because of fighting over mother's money and affairs.  Reports she no longer speaks to her two sisters or her mother because of the disagreements and problems.  Does not report anything about her father.    Reports history of alcohol stems from family dynamics and patient dealing with losing her family over arguments.  Reports she is currently in a healthy relationship with her boyfriend of 5 years and has one sone (32) who lives in [place].                       Emotional Health/Current Symptoms:     Suicide/Self harm: Patient reports she is not currently having suicidal ideation, plan or thoughts.  Has had one past attempt/plan why poisoning herself with car fumes.  Patient reports no other attempts of plans.  Feels safe in hospital and again not having any SI.      Psychotic/Dissociative Symptoms:None reported.       Attention/Behavioral Symptoms: Patient reports  feeling depressed with mood and behavior prior to being admitted to the hospital.  Reports poor sleep and fair appetite.  Reports she misses her family and abuses alcohol because this is all she knows and how she copes.  Patient alert and oriented, logical and linear in thought and conversation.        Recent Loss/Stressor: Patient reports she has lost several jobs throughout the year and dealt with job loss/finiaical burden by drinking.  Patient also reports extensive history with two sisters and mother causing a divide between the family, causing patient to feel isolated and lonely at times.      Substance Abuse/Use: Patient reports many years of drinking and denies any other drugs.  Patient reports she currently drinks about 4-5 hard liquor drinks a day, but has cut back because of a new recent job she got in October.  Patient denies any legal problems, does not feel she needs inpatient again like she completed at [name], but is interested in outpatient counseling for SA/mental Health.  Reports she has never been through any type of medical treatment like this before due to her alcohol abuse.       Disposition/Interpretive Summary:  Patient was given education regarding substance abuse and related medical problems.  Patient very aware and understanding of the two and how this is effecting her overall health.  Patient currently not on any medications for the mood, but reports she received benefits at [name] when  she was compliant with medications.  Reports she received prescriptions, but never had them filled and was unable to tell the writer what medications she was specifically on.   Reports she is very interested in outpatient counseling and arranging SA counseling, but is not interested in inpatient.  Reports she is ready to get back home soon and return to her regular job and routine.  Patient very cooperative and pleasant through entire assessment.  Will await psych MD recommendations and  will arranged for outpatient appointment and resources.  Patient reports no safety concerns at this time and CSW contracted for safety with patient in case of future problems.  Will continue to follow.  Substance abuse SBIRT completed.  Ashley Jacobs, MSW LCSWA [phone]  [phone]

## 2011-03-26 DIAGNOSIS — K859 Acute pancreatitis without necrosis or infection, unspecified: Principal | ICD-10-CM

## 2011-03-26 LAB — CULTURE, BLOOD (ROUTINE X 2)
Culture  Setup Time: 201211300103
Culture  Setup Time: 201211300108
Culture: NO GROWTH
Culture: NO GROWTH

## 2011-03-26 MED ORDER — CLONAZEPAM 0.5 MG PO TABS: 0.5000 mg | ORAL_TABLET | Freq: Every day | ORAL | Status: AC

## 2011-03-26 MED ORDER — PANTOPRAZOLE SODIUM 40 MG PO TBEC: 40.0000 mg | DELAYED_RELEASE_TABLET | Freq: Two times a day (BID) | ORAL | Status: DC

## 2011-03-26 MED ORDER — ACETAMINOPHEN 325 MG PO TABS: 650.0000 mg | ORAL_TABLET | Freq: Four times a day (QID) | ORAL | Status: AC | PRN

## 2011-03-26 MED ORDER — BENZONATATE 100 MG PO CAPS: 100.0000 mg | ORAL_CAPSULE | Freq: Three times a day (TID) | ORAL | Status: AC | PRN

## 2011-03-26 NOTE — Discharge Summary (Signed)
PATIENT DETAILS Name: [name] Age: 55 y.o. Sex: female Date of Birth: May 30, 1955 MRN: [id number]. Admit Date: 03/17/2011 Admitting Physician: Lonia Blood PCP:No primary provider on file.  PRIMARY DISCHARGE DIAGNOSIS:  Principal Problem:  *Pancreatitis-secondary to Alcohol Active Problems:  GI bleed  ANXIETY  DEPRESSION  ALCOHOL ABUSE, HX OF  Respiratory failure  FATTY LIVER DISEASE  Alcohol abuse  Delirium tremens      PAST MEDICAL HISTORY: Past Medical History  Diagnosis Date  . GERD (gastroesophageal reflux disease)   . Rosacea   . Elevated liver function tests   . Wears glasses   . Chronic diarrhea   . Pancreatitis 01/2010    2011 admission was second admission, 3rd in 02/2011  . Alcohol abuse     H/o withdrawal     DISCHARGE MEDICATIONS: Current Discharge Medication List    START taking these medications   Details  acetaminophen (TYLENOL) 325 MG tablet Take 2 tablets (650 mg total) by mouth every 6 (six) hours as needed (or Fever >/= 101). Qty: 30 tablet    benzonatate (TESSALON) 100 MG capsule Take 1 capsule (100 mg total) by mouth 3 (three) times daily as needed for cough. Qty: 20 capsule, Refills: 0    clonazePAM (KLONOPIN) 0.5 MG tablet Take 1 tablet (0.5 mg total) by mouth at bedtime. Qty: 30 tablet, Refills: 0    pantoprazole (PROTONIX) 40 MG tablet Take 1 tablet (40 mg total) by mouth 2 (two) times daily before a meal. Qty: 30 tablet, Refills: 0         BRIEF HPI:  See H&P, Labs, Consult and Test reports for all details in brief, patient was admitted for  Abdominal pain secondary to pancreatitis. For further details please see the H&P done on admission.  CONSULTATIONS:   pulmonary/intensive care  PERTINENT RADIOLOGIC STUDIES: Ct Abdomen Pelvis W Contrast  03/18/2011  *RADIOLOGY REPORT*  Clinical Data: Mid abdominal pain, with nausea and vomiting.  CT ABDOMEN AND PELVIS WITH CONTRAST  Technique:  Multidetector CT imaging of the  abdomen and pelvis was performed following the standard protocol during bolus administration of intravenous contrast.  Contrast: OMNIPAQUE IOHEXOL 300 MG/ML IV SOLN  Comparison: CT of the abdomen and pelvis, and abdominal ultrasound, performed 11/27/2010  Findings: Mild bibasilar atelectasis is noted.  There is marked soft tissue inflammation noted about the head and body of the pancreas, wrapping about the first and second segments of the duodenum, with mild involvement of the gastric antrum. Multiple rounded fluid collections are identified at the pancreatic head and body, measuring 2.3 cm, 1.7 cm and 0.9 cm in size, likely reflecting small pseudocysts.  There is concern for mild devascularization at the pancreatic head.  There is associated focal wall edema along the second segment of the duodenum, with more diffuse wall thickening noted along the duodenum.  Soft tissue stranding and fluid track laterally and inferiorly along Gerota's fascia on the right side.  The liver and spleen are unremarkable in appearance.  The patient is status post cholecystectomy, with clips noted at the gallbladder fossa.  The common hepatic duct measures 0.9 cm, within normal limits status post cholecystectomy; minimal prominence of the intrahepatic biliary ducts likely also remains within normal limits.  The spleen is unremarkable in appearance.  The adrenal glands are within normal limits.  A few small hypodensities are noted within both kidneys, measuring up to 6 mm in size, likely reflecting small cysts.  Minimal nonspecific perinephric stranding is noted on the left  side.  The kidneys are otherwise unremarkable in appearance.  There is no evidence of hydronephrosis.  No renal or ureteral stones are seen.  No free fluid is identified.  The small bowel is unremarkable in appearance.  A small to moderate hiatal hernia is noted, filled with contrast; trace adjacent fluid is nonspecific in appearance. The stomach is filled with  contrast and is otherwise grossly unremarkable.  No acute vascular abnormalities are seen.  Scattered calcification is noted along the abdominal aorta and its branches.  The appendix is normal in caliber and filled with air, without evidence for appendicitis.  Minimal diverticulosis is noted at the splenic flexure of the colon, and mild diverticulosis is seen at the proximal sigmoid colon, without evidence of diverticulitis. The colon is otherwise unremarkable in appearance.  The bladder is mildly distended and grossly unremarkable.  The uterus is within normal limits.  The ovaries are relatively symmetric; no suspicious adnexal masses are seen.  Trace fluid within the pelvis is likely physiologic in nature.  No inguinal lymphadenopathy is seen.  No acute osseous abnormalities are identified.  Vacuum phenomenon and disc space narrowing are noted at L4-L5.  IMPRESSION:  1.  Significant acute pancreatitis noted, involving the head and body of the pancreas. Multiple small pseudocysts noted at the pancreatic head and body, measuring up to 2.3 cm in size.  Likely mild devascularization of the pancreatic head. 2.  Marked associated soft tissue inflammation wraps about the first and second segments of the duodenum, with focal wall edema at the second segment of the duodenum.  Significant soft tissue stranding and fluid track inferiorly and laterally along Gerota's fascia on the right side. 3.  Likely small bilateral renal cysts noted. 4.  Small to moderate hiatal hernia seen; trace nonspecific fluid noted adjacent to the hiatal hernia. 5.  Minimal diverticulosis at the splenic flexure of the colon, and mild diverticulosis of the proximal sigmoid colon, without evidence of diverticulitis. 6.  Mild bibasilar atelectasis.  Findings were discussed with Dr. Raeford Razor at 03:33 a.m. on 03/18/2011.  Original Report Authenticated By: Tonia Ghent, M.D.   Dg Chest Port 1 View  03/24/2011  *RADIOLOGY REPORT*  Clinical Data:  Follow up respiratory failure  PORTABLE CHEST - 1 VIEW  Comparison: 03/23/2011  Findings: Right IJ catheter tip is in the projection of the lower SVC.  The heart size is normal.  Similar appearance of the bilateral, posterior layering effusions.  IMPRESSION:  1.  No change in bilateral pleural effusions  likely due to CHF.  Original Report Authenticated By: Rosealee Albee, M.D.   Dg Chest Port 1 View  03/23/2011  *RADIOLOGY REPORT*  Clinical Data: Intubated.  Check endotracheal tube position.  PORTABLE CHEST - 1 VIEW  Comparison: 03/22/2011.  Findings: Endotracheal tube terminates 3.3 cm above the carina. Right IJ central line tip projects over the SVC.  Heart size stable.  There is diffuse bilateral air space disease with collapse/consolidation in the left lower lobe.  Bilateral pleural effusions.  IMPRESSION: Probable congestive heart failure with left lower lobe collapse/consolidation.  Original Report Authenticated By: Reyes Ivan, M.D.   Dg Chest Port 1 View  03/22/2011  *RADIOLOGY REPORT*  Clinical Data: Intubated.  PORTABLE CHEST - 1 VIEW  Comparison: 03/21/2011  Findings: Cardiomegaly with bilateral perihilar and lower lobe opacities, slightly increased since prior study, presumably atelectasis although edema cannot be excluded.  Small left effusion.  Support devices are unchanged.  IMPRESSION: Slight increased perihilar and lower lobe  edema and/or atelectasis.  Small left effusion.  Original Report Authenticated By: Cyndie Chime, M.D.   Dg Chest Port 1 View  03/21/2011  *RADIOLOGY REPORT*  Clinical Data: Intubation  PORTABLE CHEST - 1 VIEW  Comparison: 03/20/2011  Findings: Endotracheal tube 3.6 cm above the carina.  Stable right IJ central line tip in the SVC region.  NG tube has been removed. Telemetry leads overlie the chest.  Exam is limited with some motion artifact.  Stable lung volumes with residual atelectasis, worse in the left lower lobe.  Small effusions evident without  interval enlargement.  No pneumothorax.  Increased gastric distention with air.  IMPRESSION: Endotracheal tube 3.6 cm above the carina.  Bibasilar atelectasis, worse on the left.  Increased gastric distention  Original Report Authenticated By: Judie Petit. Ruel Favors, M.D.   Dg Chest Port 1 View  03/20/2011  *RADIOLOGY REPORT*  Clinical Data: Right IJ catheter placement.  PORTABLE CHEST - 1 VIEW  Comparison: Chest x-ray 03/20/2011.  Findings: The right IJ central venous catheter tip is in the mid distal SVC approximately 3 cm above the cavoatrial junction.  No complicating features.  An NG tube is coursing down into the stomach.  Persistent low lung volumes with vascular crowding and bibasilar atelectasis.  Small effusions are also suspected.  IMPRESSION: Right IJ central venous catheter in good position without complicating features.  Original Report Authenticated By: P. Loralie Champagne, M.D.   Dg Chest Port 1 View  03/20/2011  *RADIOLOGY REPORT*  Clinical Data: Chest pain  PORTABLE CHEST - 1 VIEW  Comparison: Portable exam 1139 hours compared to 03/19/2011  Findings: Enlargement of cardiac silhouette. Mild pulmonary vascular congestion. Mediastinal contours normal. Decreased lung volumes with bibasilar atelectasis versus infiltrate. Probable small bibasilar effusions. Upper lungs clear. No pleural effusion or pneumothorax. Bones demineralized.  IMPRESSION: Enlargement of cardiac silhouette. Bibasilar atelectasis versus infiltrate. Aeration in left lower lobe is perhaps slightly increased versus previous study.  Original Report Authenticated By: Lollie Marrow, M.D.   Dg Chest Port 1 View  03/19/2011  *RADIOLOGY REPORT*  Clinical Data: Fever and weakness.  PORTABLE CHEST - 1 VIEW  Comparison: 11/28/2010  Findings: Bilateral lower lobe infiltrates present, slightly more prominently on the left.  There likely are small bilateral pleural effusions, left greater than right.  No edema.  The heart size is normal.   IMPRESSION: Bilateral lower lobe infiltrates.  Original Report Authenticated By: Reola Calkins, M.D.     PERTINENT LAB RESULTS: CBC:  Basename 03/24/11 0421  WBC 9.3  HGB 8.3*  HCT 24.9*  PLT 298   CMET CMP     Component Value Date/Time   NA 139 03/24/2011 0421   K 3.4* 03/24/2011 0421   CL 104 03/24/2011 0421   CO2 25 03/24/2011 0421   GLUCOSE 81 03/24/2011 0421   BUN 6 03/24/2011 0421   CREATININE 0.54 03/24/2011 0421   CALCIUM 8.0* 03/24/2011 0421   PROT 5.2* 03/23/2011 0417   ALBUMIN 2.0* 03/23/2011 0417   AST 21 03/23/2011 0417   ALT 13 03/23/2011 0417   ALKPHOS 80 03/23/2011 0417   BILITOT 0.5 03/23/2011 0417   GFRNONAA >90 03/24/2011 0421   GFRAA >90 03/24/2011 0421    GFR Estimated Creatinine Clearance: 80.9 ml/min (by C-G formula based on Cr of 0.54). No results found for this basename: LIPASE:2,AMYLASE:2 in the last 72 hours  Basename 03/24/11 0605 03/24/11 0024 03/23/11 1820  CKTOTAL 65 72 96  CKMB 1.6 1.5 1.7  CKMBINDEX -- -- --  TROPONINI <0.30 <0.30 <0.30   No results found for this basename: POCBNP:3 in the last 72 hours No results found for this basename: DDIMER:2 in the last 72 hours No results found for this basename: HGBA1C:2 in the last 72 hours No results found for this basename: CHOL:2,HDL:2,LDLCALC:2,TRIG:2,CHOLHDL:2,LDLDIRECT:2 in the last 72 hours No results found for this basename: TSH,T4TOTAL,FREET3,T3FREE,THYROIDAB in the last 72 hours No results found for this basename: VITAMINB12:2,FOLATE:2,FERRITIN:2,TIBC:2,IRON:2,RETICCTPCT:2 in the last 72 hours Coags: No results found for this basename: PT:2,INR:2 in the last 72 hours Microbiology: Recent Results (from the past 240 hour(s))  CULTURE, BLOOD (ROUTINE X 2)     Status: Normal   Collection Time   03/19/11  8:11 PM      Component Value Range Status Comment   Specimen Description BLOOD RIGHT ARM   Final    Special Requests BOTTLES DRAWN AEROBIC AND ANAEROBIC 10CC   Final    Setup Time  [id number]   Final    Culture NO GROWTH 5 DAYS   Final    Report Status 03/26/2011 FINAL   Final   CULTURE, BLOOD (ROUTINE X 2)     Status: Normal   Collection Time   03/19/11  8:11 PM      Component Value Range Status Comment   Specimen Description BLOOD LEFT HAND   Final    Special Requests BOTTLES DRAWN AEROBIC ONLY 10CC   Final    Setup Time [id number]   Final    Culture NO GROWTH 5 DAYS   Final    Report Status 03/26/2011 FINAL   Final   MRSA PCR SCREENING     Status: Normal   Collection Time   03/20/11  5:42 PM      Component Value Range Status Comment   MRSA by PCR NEGATIVE  NEGATIVE  Final      BRIEF HOSPITAL COURSE:   Principal Problem:  *Pancreatitis -secondary to ETOH,  -resolved with supportive measures.  Active Problems:  GI bleed -Secondary to [employer] Tear and Erosive Esophagitis -EGD done this admission, did confirm above -H/H stable for last few days -Per GI needs PPI indefinitely -tolerating diet with no major problems.   ANXIETY Discussed with Dr Art Buff d/c on Klonopin qhs dosing.   DEPRESSION -oupatient counselling and follow up. Dr Rogers Blocker did not recommend further medications for this at this time.   ALCOHOL ABUSE, HX OF - has been counselled extensively. - Has been seen by psych and the social worker, outpatient resources have been provided to the patient.   Respiratory failure -required Ventilator support this admission, for extreme agitation and DT's -extubated on 03/23/11.     TODAY-DAY OF DISCHARGE:  Subjective:   Kerrilyn Azbill today has no headache,no chest abdominal pain,no new weakness tingling or numbness, feels much better wants to go home today.Continues to do well, with no further abdominal pain or GI bleed. Tolerating regular diet.  Objective:   Blood pressure 118/65, pulse 83, temperature 98.2 F (36.8 C), temperature source Oral, resp. rate 27, height 5\' 6"  (1.676 m), weight 74.3 kg (163 lb 12.8 oz), SpO2  95.00%.  Intake/Output Summary (Last 24 hours) at 03/26/11 1232 Last data filed at 03/25/11 1600  Gross per 24 hour  Intake    480 ml  Output      4 ml  Net    476 ml    Exam Awake Alert, Oriented *3, No new F.N deficits, Normal affect [place].AT,PERRAL Supple Neck,No JVD, No cervical lymphadenopathy appriciated.  Symmetrical  Chest wall movement, Good air movement bilaterally, CTAB RRR,No Gallops,Rubs or new Murmurs, No Parasternal Heave +ve B.Sounds, Abd Soft, Non tender, No organomegaly appriciated, No rebound -guarding or rigidity. No Cyanosis, Clubbing or edema, No new Rash or bruise  DISPOSITION: Home  DISCHARGE INSTRUCTIONS:    Follow-up Information    Call Mental Health: [facility] . (As needed if symptoms worsen)    Contact information:   [address] [place], [place]  [phone]      Call [facility]. (As needed if symptoms worsen for depression and SA counseling)    Contact information:   [phone]         Total Time spent on discharge equals 45 minutes.  SignedJeoffrey Massed 03/26/2011 12:32 PM

## 2011-03-26 NOTE — Progress Notes (Signed)
Physical Therapy Treatment Patient Details Name: [name] MRN: [id number] DOB: 12/23/1955 Today's Date: 03/26/2011  PT Assessment/Plan  PT - Assessment/Plan Comments on Treatment Session: ready for D/C; no f/u needs PT Plan: All goals met and education completed, patient dischaged from PT services Follow Up Recommendations: None Equipment Recommended: None recommended by PT PT Goals  Acute Rehab PT Goals PT Goal: Supine/Side to Sit - Progress: Met PT Goal: Sit to Stand - Progress: Met PT Transfer Goal: Bed to Chair/Chair to Bed - Progress: Met PT Goal: Ambulate - Progress: Met PT Goal: Up/Down Stairs - Progress: Met  PT Treatment Precautions/Restrictions  Precautions Precautions: Other (comment) (minimal fall risk due to mild weakness) Restrictions Weight Bearing Restrictions: No Mobility (including Balance) Bed Mobility Supine to Sit: 7: Independent Sitting - Scoot to Edge of Bed: 7: Independent Transfers Sit to Stand: 7: Independent Stand to Sit: 7: Independent Ambulation/Gait Ambulation/Gait Assistance: 7: Independent Ambulation Distance (Feet): 500 Feet Assistive device: None Stairs: Yes Stairs Assistance: 6: Modified independent (Device/Increase time) Stair Management Technique: Alternating pattern;One rail Right Number of Stairs: 12   Posture/Postural Control Posture/Postural Control: No significant limitations Balance Balance Assessed:  (mild unsteadiness with scanning, but safe) Exercise    End of Session PT - End of Session Activity Tolerance: Patient tolerated treatment well Patient left: in bed Nurse Communication: Mobility status for ambulation;Mobility status for transfers General Behavior During Session: [facility] for tasks performed Cognition: [facility] for tasks performed  [name], [name] 03/26/2011, 10:35 AM  03/26/2011  [place] Bing, PT [phone] (pager)

## 2011-03-26 NOTE — Progress Notes (Signed)
Discharge instructions and new medications reviewed with patient. Patient voices understanding to teaching. Reviewed follow up appointments and referrals with patient. Voices understanding.  Copies of D/C instructions and prescriptions given to patient. Patient D/C to home with significant other driving. Ambulated to door.

## 2011-03-26 NOTE — Progress Notes (Signed)
Utilization review completed.  

## 2011-03-26 NOTE — Consult Note (Signed)
Patient Identification:  [name] Date of Evaluation:  03/26/2011   History of Present Illness:  55 year old Caucasian female who was admitted on the medical floor because of the epigastric pain. Patient has a history of alcohol abuse and dependence. Patient reported that she is feeling much better and she is not going to drink again because of medical issues . She reported sleep and appetite fair but feel anxious and wants some medication for anxiety. I told her she can take Klonopin 1 mg at bedtime and I discussed with the medical team also.  Patient reports this was very hard for her because she was a the sole caregiver. Patient reports estrangement from her 2 sisters after mother was placed in NH because of fighting over mother's money and affairs. Reports she no longer speaks to her two sisters or her mother because of the disagreements and problems. Does not report anything about her father.  Reports history of alcohol stems from family dynamics and patient dealing with losing her family over arguments. Reports she is currently in a healthy relationship with her boyfriend of 5 years and has one sone (32) who lives in [place].  Past psych history  patient has a history of low chronic alcohol abuse and was admitted at behavioral health. Currently patient is not following in the morning in the outpatient setting.  Mental status examination-patient is calm cooperative during interview pleasant on approach she is still have anxiety but is not suicidal homicidal not hallucinating or delusional. Cognition alert awake oriented x3 memory immediate recent remote fair attention concentration fair abstract ability fair insight and judgment intact.   Past Medical History:     Past Medical History  Diagnosis Date  . GERD (gastroesophageal reflux disease)   . Rosacea   . Elevated liver function tests   . Wears glasses   . Chronic diarrhea   . Pancreatitis 01/2010    2011 admission was  second admission, 3rd in 02/2011  . Alcohol abuse     H/o withdrawal        Past Surgical History  Procedure Date  . Cholecystectomy   . Shoulder surgery [id number]  . Diagnostic mammogram 2008  . Tubal ligation   . Colonoscopy Never    Allergies:  Allergies  Allergen Reactions  . Ciprofloxacin     REACTION: nausea    Current Medications:  Prior to Admission medications   Medication Sig Start Date End Date Taking? Authorizing Provider  acetaminophen (TYLENOL) 325 MG tablet Take 2 tablets (650 mg total) by mouth every 6 (six) hours as needed (or Fever >/= 101). 03/26/11 04/05/11  Shanker Ghimire  benzonatate (TESSALON) 100 MG capsule Take 1 capsule (100 mg total) by mouth 3 (three) times daily as needed for cough. 03/26/11 04/02/11  Shanker Ghimire  clonazePAM (KLONOPIN) 0.5 MG tablet Take 1 tablet (0.5 mg total) by mouth at bedtime. 03/26/11 04/25/11  Shanker Ghimire  pantoprazole (PROTONIX) 40 MG tablet Take 1 tablet (40 mg total) by mouth 2 (two) times daily before a meal. 03/26/11 03/25/12  Shanker Ghimire    Social History:    reports that she has quit smoking. She has never used smokeless tobacco. She reports that she drinks about 1.5 - 2 ounces of alcohol per week. She reports that she does not use illicit drugs.   Family History:    Family History  Problem Relation Age of Onset  . Stroke Mother   . Heart disease Father   . Heart disease Sister  DIAGNOSIS:   AXIS I  chronic alcohol dependence, anxiety disorder NOS.   AXIS II  Deffered  AXIS III See medical notes.  AXIS IV  alcohol abuse   AXIS V 51-60 moderate symptoms     Recommendations: Patient can be  discharged to followup at [facility]. klonopin 1 mg at bedtime can be prescribed at the time of discharge.    Eulogio Ditch, MD

## 2011-03-27 NOTE — Progress Notes (Signed)
   CARE MANAGEMENT NOTE 03/27/2011  Patient:  [name], [name]   Account Number:  [id number]  Date Initiated:  03/18/2011  Documentation initiated by:  Donn Pierini  Subjective/Objective Assessment:   Pt admitted with pancreatitis, abd pain     Action/Plan:   PTA pt lived at home  independent with ADLs   Anticipated DC Date:  04/01/2011   Anticipated DC Plan:  HOME/SELF CARE      DC Planning Services  CM consult      Choice offered to / List presented to:             Status of service:  Completed, signed off Medicare Important Message given?   (If response is "NO", the following Medicare IM given date fields will be blank) Date Medicare IM given:   Date Additional Medicare IM given:    Discharge Disposition:  HOME/SELF CARE  Per UR Regulation:  Reviewed for med. necessity/level of care/duration of stay  Comments:  PCP- pt use to [facility] (currently does not have one)  03/25/11- 1500- Donn Pierini RN, BSN [phone] Pt tx out of ICU, psych to consult, CM to continue to follow for d/c needs.  03-24-11 11:15am Avie Arenas, RNBSN [phone] UR Completed.  03/19/11- 1223- Donn Pierini RN, BSN [phone] Spoke with pt at bedside- per conversation pt states that she use to see Dr. Ronne Binning but no longer sees him. Will give pt [facility] number on discharge instructions and also informed pt to contact insurance company for assistance with finding PCP. CM to follow

## 2011-04-07 ENCOUNTER — Encounter (HOSPITAL_COMMUNITY): Payer: Self-pay | Admitting: Internal Medicine

## 2011-07-01 ENCOUNTER — Emergency Department (HOSPITAL_COMMUNITY)
Admission: EM | Admit: 2011-07-01 | Discharge: 2011-07-01 | Disposition: A | Discharge status: Home / Self Care | Payer: Self-pay | Attending: Emergency Medicine | Admitting: Emergency Medicine

## 2011-07-01 ENCOUNTER — Encounter (HOSPITAL_COMMUNITY): Payer: Self-pay | Admitting: Emergency Medicine

## 2011-07-01 DIAGNOSIS — F1010 Alcohol abuse, uncomplicated: Secondary | ICD-10-CM | POA: Insufficient documentation

## 2011-07-01 DIAGNOSIS — R109 Unspecified abdominal pain: Secondary | ICD-10-CM | POA: Insufficient documentation

## 2011-07-01 DIAGNOSIS — R0682 Tachypnea, not elsewhere classified: Secondary | ICD-10-CM | POA: Insufficient documentation

## 2011-07-01 DIAGNOSIS — K2920 Alcoholic gastritis without bleeding: Secondary | ICD-10-CM | POA: Insufficient documentation

## 2011-07-01 DIAGNOSIS — R7989 Other specified abnormal findings of blood chemistry: Secondary | ICD-10-CM | POA: Insufficient documentation

## 2011-07-01 LAB — COMPREHENSIVE METABOLIC PANEL
BUN: 12 mg/dL (ref 6–23)
CO2: 25 mEq/L (ref 19–32)
Calcium: 9.5 mg/dL (ref 8.4–10.5)
Chloride: 98 mEq/L (ref 96–112)
Creatinine, Ser: 0.57 mg/dL (ref 0.50–1.10)
GFR calc Af Amer: >90 mL/min (ref >90)
GFR calc non Af Amer: >90 mL/min (ref >90)
Glucose, Bld: 83 mg/dL (ref 70–99)
Total Bilirubin: 2 mg/dL — ABNORMAL HIGH (ref 0.3–1.2)

## 2011-07-01 LAB — CBC
HCT: 40.6 % (ref 36.0–46.0)
Hemoglobin: 13.3 g/dL (ref 12.0–15.0)
MCV: 87.9 fL (ref 78.0–100.0)
RDW: 17 % — ABNORMAL HIGH (ref 11.5–15.5)
WBC: 8.3 10*3/uL (ref 4.0–10.5)

## 2011-07-01 LAB — LIPASE, BLOOD: Lipase: 39 U/L (ref 11–59)

## 2011-07-01 LAB — DIFFERENTIAL
Basophils Absolute: 0 10*3/uL (ref 0.0–0.1)
Eosinophils Relative: 1 % (ref 0–5)
Lymphocytes Relative: 17 % (ref 12–46)
Lymphs Abs: 1.4 10*3/uL (ref 0.7–4.0)
Monocytes Absolute: 0.7 10*3/uL (ref 0.1–1.0)
Monocytes Relative: 8 % (ref 3–12)

## 2011-07-01 MED ORDER — HYDROMORPHONE HCL PF 1 MG/ML IJ SOLN
1.0000 mg | Freq: Once | INTRAMUSCULAR | Status: AC
  Administered 2011-07-01: 1 mg via INTRAVENOUS
  Filled 2011-07-01: qty 1

## 2011-07-01 MED ORDER — ONDANSETRON HCL 4 MG/2ML IJ SOLN
4.0000 mg | Freq: Once | INTRAMUSCULAR | Status: AC
  Administered 2011-07-01: 4 mg via INTRAVENOUS
  Filled 2011-07-01: qty 2

## 2011-07-01 MED ORDER — LORAZEPAM 1 MG PO TABS: 1.0000 mg | ORAL_TABLET | Freq: Three times a day (TID) | ORAL | Status: AC | PRN

## 2011-07-01 MED ORDER — HYDROMORPHONE HCL 2 MG PO TABS: 2.0000 mg | ORAL_TABLET | ORAL | Status: AC | PRN

## 2011-07-01 MED ORDER — METOCLOPRAMIDE HCL 10 MG PO TABS: 10.0000 mg | ORAL_TABLET | Freq: Four times a day (QID) | ORAL | Status: DC | PRN

## 2011-07-01 MED ORDER — SODIUM CHLORIDE 0.9 % IV SOLN
Freq: Once | INTRAVENOUS | Status: AC
  Administered 2011-07-01: 14:00:00 via INTRAVENOUS

## 2011-07-01 NOTE — ED Notes (Addendum)
Pt c/o of sharp pain throughout abdomin accompanied by nausea, beginning yesterday at 6 pm. Pt reports history of pancreatitis, symptoms are the similar to when she had pancreatitis

## 2011-07-01 NOTE — ED Provider Notes (Signed)
History     CSN: [id number]  Arrival date & time 07/01/11  1059   First MD Initiated Contact with Patient 07/01/11 1252      Chief Complaint  Patient presents with  . Abdominal Pain    hx of pancretis and has been having pain for a few days now     (Consider location/radiation/quality/duration/timing/severity/associated sxs/prior treatment) Patient is a 57 y.o. female presenting with abdominal pain. The history is provided by the patient.  Abdominal Pain The primary symptoms of the illness include abdominal pain.  She has been having upper abdominal pain for the last 2 days. Pain is severe and radiates through to the back. There is associated nausea and vomiting. She rates pain at 8/10 currently and 10 out of 10 at its worst. Nothing makes it better, and is worse if he tries to eat or drink. She has had similar pains like this in the past due to pancreatitis. She admits to drinking a fifth of hard liquor in the 2 days prior to the onset of her pain. She denies fever, chills, sweats. Denies constipation diarrhea. Pain is described as sharp.  Past Medical History  Diagnosis Date  . GERD (gastroesophageal reflux disease)   . Rosacea   . Elevated liver function tests   . Wears glasses   . Chronic diarrhea   . Pancreatitis 01/2010    2011 admission was second admission, 3rd in 02/2011  . Alcohol abuse     H/o withdrawal     Past Surgical History  Procedure Date  . Cholecystectomy   . Shoulder surgery [id number]  . Diagnostic mammogram 2008  . Tubal ligation   . Colonoscopy Never  . Esophagogastroduodenoscopy 03/21/2011    Procedure: ESOPHAGOGASTRODUODENOSCOPY (EGD);  Surgeon: Yancey Flemings, MD;  Location: [facility] ENDOSCOPY;  Service: Endoscopy;  Laterality: N/A;  Patient may need to be done at bedside tomorrow depending on status    Family History  Problem Relation Age of Onset  . Stroke Mother   . Heart disease Father   . Heart disease Sister     History  Substance Use Topics    . Smoking status: Former Games developer  . Smokeless tobacco: Never Used  . Alcohol Use: 1.5 - 2.0 oz/week    3-4 drink(s) per week     2-10 drinks per week as of 09/01/10    OB History    Grav Para Term Preterm Abortions TAB SAB Ect Mult Living                  Review of Systems  Gastrointestinal: Positive for abdominal pain.  All other systems reviewed and are negative.    Allergies  Ciprofloxacin  Home Medications   Current Outpatient Rx  Name Route Sig Dispense Refill  . ACETAMINOPHEN 500 MG PO TABS Oral Take 1,000 mg by mouth every 6 (six) hours as needed. For pain.    Marland Kitchen PANTOPRAZOLE SODIUM 40 MG PO TBEC Oral Take 1 tablet (40 mg total) by mouth 2 (two) times daily before a meal. 30 tablet 0    BP 145/90  Pulse 72  Temp(Src) 98 F (36.7 C) (Oral)  Resp 24  SpO2 100%  Physical Exam  Nursing note and vitals reviewed.  56 year old female who is resting comfortably and in no acute distress. Vital signs are significant for mild tachypnea with respiratory rate of 24, and mild hypertension with blood pressure 140/90. Oxygen saturation is 100% which is normal. Head is normocephalic and atraumatic. PERRLA, EOMI. There  is no scleral icterus. Oropharynx is clear. Neck is nontender and supple without adenopathy. Lungs are clear without rales, wheezes, rhonchi. Back is nontender there's no CVA tenderness. Heart has regular rate and rhythm without murmur. Abdomen is soft, flat, with moderate tenderness across the upper abdomen with forced tenderness present at the epigastric area. There is no rebound or guarding. Extremities have no cyanosis or edema. Skin is warm and dry without rash. Neurologic: Mental status is normal, cranial nerves are intact, there no focal motor or sensory deficits.  ED Course  Procedures (including critical care time)  Results for orders placed during the hospital encounter of 07/01/11  CBC      Component Value Range   WBC 8.3  4.0 - 10.5 (K/uL)   RBC 4.62   3.87 - 5.11 (MIL/uL)   Hemoglobin 13.3  12.0 - 15.0 (g/dL)   HCT 45.4  09.8 - 11.9 (%)   MCV 87.9  78.0 - 100.0 (fL)   MCH 28.8  26.0 - 34.0 (pg)   MCHC 32.8  30.0 - 36.0 (g/dL)   RDW 14.7 (*) 82.9 - 15.5 (%)   Platelets 287  150 - 400 (K/uL)  DIFFERENTIAL      Component Value Range   Neutrophils Relative 74  43 - 77 (%)   Neutro Abs 6.1  1.7 - 7.7 (K/uL)   Lymphocytes Relative 17  12 - 46 (%)   Lymphs Abs 1.4  0.7 - 4.0 (K/uL)   Monocytes Relative 8  3 - 12 (%)   Monocytes Absolute 0.7  0.1 - 1.0 (K/uL)   Eosinophils Relative 1  0 - 5 (%)   Eosinophils Absolute 0.1  0.0 - 0.7 (K/uL)   Basophils Relative 0  0 - 1 (%)   Basophils Absolute 0.0  0.0 - 0.1 (K/uL)  COMPREHENSIVE METABOLIC PANEL      Component Value Range   Sodium 137  135 - 145 (mEq/L)   Potassium 3.2 (*) 3.5 - 5.1 (mEq/L)   Chloride 98  96 - 112 (mEq/L)   CO2 25  19 - 32 (mEq/L)   Glucose, Bld 83  70 - 99 (mg/dL)   BUN 12  6 - 23 (mg/dL)   Creatinine, Ser 5.62  0.50 - 1.10 (mg/dL)   Calcium 9.5  8.4 - 13.0 (mg/dL)   Total Protein 7.7  6.0 - 8.3 (g/dL)   Albumin 3.5  3.5 - 5.2 (g/dL)   AST 865 (*) 0 - 37 (U/L)   ALT 162 (*) 0 - 35 (U/L)   Alkaline Phosphatase 193 (*) 39 - 117 (U/L)   Total Bilirubin 2.0 (*) 0.3 - 1.2 (mg/dL)   GFR calc non Af Amer >90  >90 (mL/min)   GFR calc Af Amer >90  >90 (mL/min)  LIPASE, BLOOD      Component Value Range   Lipase 39  11 - 59 (U/L)   Lipase is come back normal. She had an elevated lipase and recent hospitalization, so it is assumed that her pancreas is still able to generate lipase. At this point, pain seems most likely due to alcoholic gastritis. She's been given IV fluids and IV ondansetron and IV hydromorphone. She got temporary relief from the hydromorphone and required a second dose. She got good relief with that second dose. She is now concerned that her pain will come back and I speak able to be controlled. I have advised her that I cannot admit her because of her fear  of pain. Because  of elevated LFTs, she cannot be given prescription for pain medication with acetaminophen. She's given a prescription for hydromorphone 2 mg to take every 4 hours as needed. She's also requesting Ativan because she is worried she is going to go through withdrawal and prescription is given for Ativan 1 mg to take 3 times a day as needed. She's also given a prescription for Reglan for nausea. She is to return if symptoms are not able to be controlled adequately at home.   1. Abdominal pain   2. Alcoholic gastritis   3. Alcohol abuse   4. 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 Old records have been reviewed and she has been admitted for flareups of alcoholic pancreatitis in the past.        Dione Booze, MD 07/01/11 1801

## 2011-07-01 NOTE — Progress Notes (Signed)
ED CM contacted by [facility] staff to assist with pt questions about pcp.  Cm spoke with pt who confirms [name] is her pcp and last seen in February 2013.  CM discussed pcp generally contacted by [name] MD if assistance needed only.  Pt voiced understanding and appreciation of Cm answering her questions.

## 2011-07-01 NOTE — ED Notes (Signed)
Patient request something to eat or drink. EDP aware and denies request. Patient informed why they can not have food or drink at this moment.

## 2011-07-01 NOTE — Discharge Instructions (Signed)
Stop taking acetaminophen and. Your liver is inflamed and acetaminophen can harm the liver. Return to the emergency department if pain and nausea or not being controlled with the prescriptions given to you.  Abdominal Pain Abdominal pain can be caused by many things. Your caregiver decides the seriousness of your pain by an examination and possibly blood tests and X-rays. Many cases can be observed and treated at home. Most abdominal pain is not caused by a disease and will probably improve without treatment. However, in many cases, more time must pass before a clear cause of the pain can be found. Before that point, it may not be known if you need more testing, or if hospitalization or surgery is needed. HOME CARE INSTRUCTIONS   Do not take laxatives unless directed by your caregiver.   Take pain medicine only as directed by your caregiver.   Only take over-the-counter or prescription medicines for pain, discomfort, or fever as directed by your caregiver.   Try a clear liquid diet (broth, tea, or water) for as long as directed by your caregiver. Slowly move to a bland diet as tolerated.  SEEK IMMEDIATE MEDICAL CARE IF:   The pain does not go away.   You have a fever.   You keep throwing up (vomiting).   The pain is felt only in portions of the abdomen. Pain in the right side could possibly be appendicitis. In an adult, pain in the left lower portion of the abdomen could be colitis or diverticulitis.   You pass bloody or black tarry stools.  MAKE SURE YOU:   Understand these instructions.   Will watch your condition.   Will get help right away if you are not doing well or get worse.  Document Released: 01/14/2005 Document Revised: 03/26/2011 Document Reviewed: 11/23/2007 [facility] Patient Information 2012 [place], [place].  Alcohol Problems Most adults who drink alcohol drink in moderation (not a lot) are at low risk for developing problems related to their drinking. However, all  drinkers, including low-risk drinkers, should know about the health risks connected with drinking alcohol. RECOMMENDATIONS FOR LOW-RISK DRINKING  Drink in moderation. Moderate drinking is defined as follows:   Men - no more than 2 drinks per day.   Nonpregnant women - no more than 1 drink per day.   Over age 20 - no more than 1 drink per day.  A standard drink is 12 grams of pure alcohol, which is equal to a 12 ounce bottle of beer or wine cooler, a 5 ounce glass of wine, or 1.5 ounces of distilled spirits (such as whiskey, brandy, vodka, or rum).  ABSTAIN FROM (DO NOT DRINK) ALCOHOL:  When pregnant or considering pregnancy.   When taking a medication that interacts with alcohol.   If you are alcohol dependent.   A medical condition that prohibits drinking alcohol (such as ulcer, liver disease, or heart disease).  DISCUSS WITH YOUR CAREGIVER:  If you are at risk for coronary heart disease, discuss the potential benefits and risks of alcohol use: Light to moderate drinking is associated with lower rates of coronary heart disease in certain populations (for example, men over age 67 and postmenopausal women). Infrequent or nondrinkers are advised not to begin light to moderate drinking to reduce the risk of coronary heart disease so as to avoid creating an alcohol-related problem. Similar protective effects can likely be gained through proper diet and exercise.   Women and the elderly have smaller amounts of body water than men. As a  result women and the elderly achieve a higher blood alcohol concentration after drinking the same amount of alcohol.   Exposing a fetus to alcohol can cause a broad range of birth defects referred to as Fetal Alcohol Syndrome (FAS) or Alcohol-Related Birth Defects (ARBD). Although FAS/ARBD is connected with excessive alcohol consumption during pregnancy, studies also have reported neurobehavioral problems in infants born to mothers reporting drinking an average of  1 drink per day during pregnancy.   Heavier drinking (the consumption of more than 4 drinks per occasion by men and more than 3 drinks per occasion by women) impairs learning (cognitive) and psychomotor functions and increases the risk of alcohol-related problems, including accidents and injuries.  CAGE QUESTIONS:   Have you ever felt that you should Cut down on your drinking?   Have people Annoyed you by criticizing your drinking?   Have you ever felt bad or Guilty about your drinking?   Have you ever had a drink first thing in the morning to steady your nerves or get rid of a hangover (Eye opener)?  If you answered positively to any of these questions: You may be at risk for alcohol-related problems if alcohol consumption is:   Men: Greater than 14 drinks per week or more than 4 drinks per occasion.   Women: Greater than 7 drinks per week or more than 3 drinks per occasion.  Do you or your family have a medical history of alcohol-related problems, such as:  Blackouts.   Sexual dysfunction.   Depression.   Trauma.   Liver dysfunction.   Sleep disorders.   Hypertension.   Chronic abdominal pain.   Has your drinking ever caused you problems, such as problems with your family, problems with your work (or school) performance, or accidents/injuries?   Do you have a compulsion to drink or a preoccupation with drinking?   Do you have poor control or are you unable to stop drinking once you have started?   Do you have to drink to avoid withdrawal symptoms?   Do you have problems with withdrawal such as tremors, nausea, sweats, or mood disturbances?   Does it take more alcohol than in the past to get you high?   Do you feel a strong urge to drink?   Do you change your plans so that you can have a drink?   Do you ever drink in the morning to relieve the shakes or a hangover?  If you have answered a number of the previous questions positively, it may be time for you to talk  to your caregivers, family, and friends and see if they think you have a problem. Alcoholism is a chemical dependency that keeps getting worse and will eventually destroy your health and relationships. Many alcoholics end up dead, impoverished, or in prison. This is often the end result of all chemical dependency.  Do not be discouraged if you are not ready to take action immediately.   Decisions to change behavior often involve up and down desires to change and feeling like you cannot decide.   Try to think more seriously about your drinking behavior.   Think of the reasons to quit.  WHERE TO GO FOR ADDITIONAL INFORMATION   The National Institute on Alcohol Abuse and Alcoholism (NIAAA)www.niaaa.nih.gov   [employer] on Alcoholism and Drug Dependence (NCADD)www.ncadd.org   American Society of Addiction Medicine (ASAM)www.https://anderson-johnson.com/  Document Released: 04/06/2005 Document Revised: 03/26/2011 Document Reviewed: 11/23/2007 [facility] Patient Information 2012 [place], [place].  RESOURCE GUIDE  Psychological  Services [facility]  [phone] [facility]  [phone] [facility]   [phone] (emergency services [phone])  Substance Abuse [facility]  [phone] [facility] [phone] [facility] [place] [phone] [facility] [phone] [facility]  [phone]  Hydromorphone tablets What is this medicine? HYDROMORPHONE (hye droe MOR fone) is a pain reliever. It is used to treat moderate to severe pain. This medicine may be used for other purposes; ask your health care provider or pharmacist if you have questions. What should I tell my health care provider before I take this medicine? They need to know if you have any of these conditions: -brain tumor -drug abuse or addiction -head injury -heart disease -frequently drink alcohol containing drinks -kidney disease  or problems going to the bathroom -liver disease -lung disease, asthma, or breathing problems -mental problems -an allergic or unusual reaction to lactose, hydromorphone, other opioid analgesics, other medicines, sulfites, foods, dyes, or preservatives -pregnant or trying to get pregnant -breast-feeding How should I use this medicine? Take this medicine by mouth with a glass of water. If the medicine upsets your stomach, take it with food or milk. Follow the directions on the prescription label. Do not take more medicine than you are told to take. Talk to your pediatrician regarding the use of this medicine in children. Special care may be needed. Overdosage: If you think you have taken too much of this medicine contact a poison control center or emergency room at once. NOTE: This medicine is only for you. Do not share this medicine with others. What if I miss a dose? If you miss a dose, take it as soon as you can. If it is almost time for your next dose, take only that dose. Do not take double or extra doses. What may interact with this medicine? -alcohol -antihistamines for allergy, cough and cold -medicines for anesthesia -medicines for depression, anxiety, or psychotic disturbances -medicines for sleep -muscle relaxants -naltrexone -narcotic medicines for pain -phenothiazines like chlorpromazine, mesoridazine, prochlorperazine, thioridazine This list may not describe all possible interactions. Give your health care provider a list of all the medicines, herbs, non-prescription drugs, or dietary supplements you use. Also tell them if you smoke, drink alcohol, or use illegal drugs. Some items may interact with your medicine. What should I watch for while using this medicine? Tell your doctor or health care professional if your pain does not go away, if it gets worse, or if you have new or a different type of pain. You may develop tolerance to the medicine. Tolerance means that you will  need a higher dose of the medicine for pain relief. Tolerance is normal and is expected if you take this medicine for a long time. Do not suddenly stop taking your medicine because you may develop a severe reaction. Your body becomes used to the medicine. This does NOT mean you are addicted. Addiction is a behavior related to getting and using a drug for a non-medical reason. If you have pain, you have a medical reason to take pain medicine. Your doctor will tell you how much medicine to take. If your doctor wants you to stop the medicine, the dose will be slowly lowered over time to avoid any side effects. You may get drowsy or dizzy. Do not drive, use machinery, or do anything that needs mental alertness until you know how this medicine affects you. Do not stand or sit up quickly, especially if you are  an older patient. This reduces the risk of dizzy or fainting spells. Alcohol may interfere with the effect of this medicine. Avoid alcoholic drinks. This medicine will cause constipation. Try to have a bowel movement at least every 2 to 3 days. If you do not have a bowel movement for 3 days, call your doctor or health care professional. Your mouth may get dry. Chewing sugarless gum or sucking hard candy, and drinking plenty of water may help. Contact your doctor if the problem does not go away or is severe. What side effects may I notice from receiving this medicine? Side effects that you should report to your doctor or health care professional as soon as possible: -allergic reactions like skin rash, itching or hives, swelling of the face, lips, or tongue -breathing problems -changes in vision -confusion -feeling faint or lightheaded, falls -seizures -slow or fast heartbeat -trouble passing urine or change in the amount of urine -trouble with balance, talking, walking Side effects that usually do not require medical attention (report to your doctor or health care professional if they continue or are  bothersome): -difficulty sleeping -drowsiness -dry mouth -flushing -headache -itching -loss of appetite -nausea, vomiting This list may not describe all possible side effects. Call your doctor for medical advice about side effects. You may report side effects to FDA at 1-800-FDA-1088. Where should I keep my medicine? Keep out of the reach of children. This medicine can be abused. Keep your medicine in a safe place to protect it from theft. Do not share this medicine with anyone. Selling or giving away this medicine is dangerous and against the law. Store at room temperature between 15 and 30 degrees C (59 and 86 degrees F). Keep container tightly closed. Protect from light. Flush any unused medicines down the toilet. Do not use the medicine after the expiration date. NOTE: This sheet is a summary. It may not cover all possible information. If you have questions about this medicine, talk to your doctor, pharmacist, or health care provider.  2012, [employer] Standard. (03/02/2008 10:24:26 AM)  Metoclopramide tablets What is this medicine? METOCLOPRAMIDE (met oh kloe PRA mide) is used to treat the symptoms of gastroesophageal reflux disease (GERD) like heartburn. It is also used to treat people with slow emptying of the stomach and intestinal tract. This medicine may be used for other purposes; ask your health care provider or pharmacist if you have questions. What should I tell my health care provider before I take this medicine? They need to know if you have any of these conditions: -breast cancer -depression -diabetes -heart failure -high blood pressure -kidney disease -liver disease -Parkinson's disease or a movement disorder -pheochromocytoma -seizures -stomach obstruction, bleeding, or perforation -an unusual or allergic reaction to metoclopramide, procainamide, sulfites, other medicines, foods, dyes, or preservatives -pregnant or trying to get pregnant -breast-feeding How  should I use this medicine? Take this medicine by mouth with a glass of water. Follow the directions on the prescription label. Take this medicine on an empty stomach, about 30 minutes before eating. Take your doses at regular intervals. Do not take your medicine more often than directed. Do not stop taking except on the advice of your doctor or health care professional. A special MedGuide will be given to you by the pharmacist with each prescription and refill. Be sure to read this information carefully each time. Talk to your pediatrician regarding the use of this medicine in children. Special care may be needed. Overdosage: If you think you have taken  too much of this medicine contact a poison control center or emergency room at once. NOTE: This medicine is only for you. Do not share this medicine with others. What if I miss a dose? If you miss a dose, take it as soon as you can. If it is almost time for your next dose, take only that dose. Do not take double or extra doses. What may interact with this medicine? -acetaminophen -cyclosporine -digoxin -medicines for blood pressure -medicines for diabetes, including insulin -medicines for hay fever and other allergies -medicines for depression, especially an Monoamine Oxidase Inhibitor (MAOI) -medicines for Parkinson's disease, like levodopa -medicines for sleep or for pain -tetracycline This list may not describe all possible interactions. Give your health care provider a list of all the medicines, herbs, non-prescription drugs, or dietary supplements you use. Also tell them if you smoke, drink alcohol, or use illegal drugs. Some items may interact with your medicine. What should I watch for while using this medicine? It may take a few weeks for your stomach condition to start to get better. However, do not take this medicine for longer than 12 weeks. The longer you take this medicine, and the more you take it, the greater your chances are of  developing serious side effects. If you are an elderly patient, a female patient, or you have diabetes, you may be at an increased risk for side effects from this medicine. Contact your doctor immediately if you start having movements you cannot control such as lip smacking, rapid movements of the tongue, involuntary or uncontrollable movements of the eyes, head, arms and legs, or muscle twitches and spasms. Patients and their families should watch out for worsening depression or thoughts of suicide. Also watch out for any sudden or severe changes in feelings such as feeling anxious, agitated, panicky, irritable, hostile, aggressive, impulsive, severely restless, overly excited and hyperactive, or not being able to sleep. If this happens, especially at the beginning of treatment or after a change in dose, call your doctor. Do not treat yourself for high fever. Ask your doctor or health care professional for advice. You may get drowsy or dizzy. Do not drive, use machinery, or do anything that needs mental alertness until you know how this drug affects you. Do not stand or sit up quickly, especially if you are an older patient. This reduces the risk of dizzy or fainting spells. Alcohol can make you more drowsy and dizzy. Avoid alcoholic drinks. What side effects may I notice from receiving this medicine? Side effects that you should report to your doctor or health care professional as soon as possible: -allergic reactions like skin rash, itching or hives, swelling of the face, lips, or tongue -abnormal production of milk in females -breast enlargement in both males and females -change in the way you walk -difficulty moving, speaking or swallowing -drooling, lip smacking, or rapid movements of the tongue -excessive sweating -fever -involuntary or uncontrollable movements of the eyes, head, arms and legs -irregular heartbeat or palpitations -muscle twitches and spasms -unusually weak or tired Side  effects that usually do not require medical attention (report to your doctor or health care professional if they continue or are bothersome): -change in sex drive or performance -depressed mood -diarrhea -difficulty sleeping -headache -menstrual changes -restless or nervous This list may not describe all possible side effects. Call your doctor for medical advice about side effects. You may report side effects to FDA at 1-800-FDA-1088. Where should I keep my medicine? Keep out of  the reach of children. Store at room temperature between 20 and 25 degrees C (68 and 77 degrees F). Protect from light. Keep container tightly closed. Throw away any unused medicine after the expiration date. NOTE: This sheet is a summary. It may not cover all possible information. If you have questions about this medicine, talk to your doctor, pharmacist, or health care provider.  2012, Elsevier/Gold Standard. (11/30/2007 4:30:05 PM)  Lorazepam tablets What is this medicine? LORAZEPAM (lor A ze pam) is a benzodiazepine. It is used to treat anxiety. This medicine may be used for other purposes; ask your health care provider or pharmacist if you have questions. What should I tell my health care provider before I take this medicine? They need to know if you have any of these conditions: -alcohol or drug abuse problem -bipolar disorder, depression, psychosis or other mental health condition -glaucoma -kidney or liver disease -lung disease or breathing difficulties -myasthenia gravis -Parkinson's disease -seizures or a history of seizures -suicidal thoughts -an unusual or allergic reaction to lorazepam, other benzodiazepines, foods, dyes, or preservatives -pregnant or trying to get pregnant -breast-feeding How should I use this medicine? Take this medicine by mouth with a glass of water. Follow the directions on the prescription label. If it upsets your stomach, take it with food or milk. Take your medicine at  regular intervals. Do not take it more often than directed. Do not stop taking except on the advice of your doctor or health care professional. Talk to your pediatrician regarding the use of this medicine in children. Special care may be needed. Overdosage: If you think you have taken too much of this medicine contact a poison control center or emergency room at once. NOTE: This medicine is only for you. Do not share this medicine with others. What if I miss a dose? If you miss a dose, take it as soon as you can. If it is almost time for your next dose, take only that dose. Do not take double or extra doses. What may interact with this medicine? -barbiturate medicines for inducing sleep or treating seizures, like phenobarbital -clozapine -medicines for depression, mental problems or psychiatric disturbances -medicines for sleep -phenytoin -probenecid -theophylline -valproic acid This list may not describe all possible interactions. Give your health care provider a list of all the medicines, herbs, non-prescription drugs, or dietary supplements you use. Also tell them if you smoke, drink alcohol, or use illegal drugs. Some items may interact with your medicine. What should I watch for while using this medicine? Visit your doctor or health care professional for regular checks on your progress. Your body may become dependent on this medicine, ask your doctor or health care professional if you still need to take it. However, if you have been taking this medicine regularly for some time, do not suddenly stop taking it. You must gradually reduce the dose or you may get severe side effects. Ask your doctor or health care professional for advice before increasing or decreasing the dose. Even after you stop taking this medicine it can still affect your body for several days. You may get drowsy or dizzy. Do not drive, use machinery, or do anything that needs mental alertness until you know how this medicine  affects you. To reduce the risk of dizzy and fainting spells, do not stand or sit up quickly, especially if you are an older patient. Alcohol may increase dizziness and drowsiness. Avoid alcoholic drinks. Do not treat yourself for coughs, colds or allergies without  asking your doctor or health care professional for advice. Some ingredients can increase possible side effects. What side effects may I notice from receiving this medicine? Side effects that you should report to your doctor or health care professional as soon as possible: -changes in vision -confusion -depression -mood changes, excitability or aggressive behavior -movement difficulty, staggering or jerky movements -muscle cramps -restlessness -weakness or tiredness Side effects that usually do not require medical attention (report to your doctor or health care professional if they continue or are bothersome): -constipation or diarrhea -difficulty sleeping, nightmares -dizziness, drowsiness -headache -nausea, vomiting This list may not describe all possible side effects. Call your doctor for medical advice about side effects. You may report side effects to FDA at 1-800-FDA-1088. Where should I keep my medicine? Keep out of the reach of children. This medicine can be abused. Keep your medicine in a safe place to protect it from theft. Do not share this medicine with anyone. Selling or giving away this medicine is dangerous and against the law. Store at room temperature between 20 and 25 degrees C (68 and 77 degrees F). Protect from light. Keep container tightly closed. Throw away any unused medicine after the expiration date. NOTE: This sheet is a summary. It may not cover all possible information. If you have questions about this medicine, talk to your doctor, pharmacist, or health care provider.  2012, Elsevier/[employer] Standard. (10/07/2007 2:58:20 PM)

## 2011-09-22 ENCOUNTER — Encounter (HOSPITAL_COMMUNITY): Payer: Self-pay | Admitting: *Deleted

## 2011-09-22 ENCOUNTER — Inpatient Hospital Stay (HOSPITAL_COMMUNITY)
Admission: EM | Admit: 2011-09-22 | Discharge: 2011-09-29 | DRG: 439 | Disposition: A | Discharge status: Home / Self Care | Payer: Self-pay | Attending: Internal Medicine | Admitting: Internal Medicine

## 2011-09-22 DIAGNOSIS — K862 Cyst of pancreas: Secondary | ICD-10-CM | POA: Diagnosis present

## 2011-09-22 DIAGNOSIS — F1021 Alcohol dependence, in remission: Secondary | ICD-10-CM

## 2011-09-22 DIAGNOSIS — K859 Acute pancreatitis without necrosis or infection, unspecified: Secondary | ICD-10-CM

## 2011-09-22 DIAGNOSIS — K7689 Other specified diseases of liver: Secondary | ICD-10-CM | POA: Diagnosis present

## 2011-09-22 DIAGNOSIS — F1020 Alcohol dependence, uncomplicated: Secondary | ICD-10-CM | POA: Diagnosis present

## 2011-09-22 DIAGNOSIS — K700 Alcoholic fatty liver: Secondary | ICD-10-CM | POA: Diagnosis present

## 2011-09-22 DIAGNOSIS — N390 Urinary tract infection, site not specified: Secondary | ICD-10-CM | POA: Diagnosis present

## 2011-09-22 DIAGNOSIS — R7402 Elevation of levels of lactic acid dehydrogenase (LDH): Secondary | ICD-10-CM | POA: Diagnosis present

## 2011-09-22 DIAGNOSIS — R740 Nonspecific elevation of levels of transaminase and lactic acid dehydrogenase [LDH]: Secondary | ICD-10-CM

## 2011-09-22 DIAGNOSIS — F329 Major depressive disorder, single episode, unspecified: Secondary | ICD-10-CM | POA: Diagnosis present

## 2011-09-22 DIAGNOSIS — R7401 Elevation of levels of liver transaminase levels: Secondary | ICD-10-CM | POA: Diagnosis present

## 2011-09-22 DIAGNOSIS — K863 Pseudocyst of pancreas: Secondary | ICD-10-CM | POA: Diagnosis present

## 2011-09-22 DIAGNOSIS — E782 Mixed hyperlipidemia: Secondary | ICD-10-CM

## 2011-09-22 DIAGNOSIS — F3289 Other specified depressive episodes: Secondary | ICD-10-CM | POA: Diagnosis present

## 2011-09-22 HISTORY — DX: Other specified postprocedural states: Z98.890

## 2011-09-22 HISTORY — DX: Other specified postprocedural states: R11.2

## 2011-09-22 LAB — URINALYSIS, ROUTINE W REFLEX MICROSCOPIC
Glucose, UA: NEGATIVE mg/dL
Hgb urine dipstick: NEGATIVE
Specific Gravity, Urine: 1.019 (ref 1.005–1.030)

## 2011-09-22 LAB — COMPREHENSIVE METABOLIC PANEL WITH GFR
ALT: 150 U/L — ABNORMAL HIGH (ref 0–35)
AST: 330 U/L — ABNORMAL HIGH (ref 0–37)
Albumin: 3.5 g/dL (ref 3.5–5.2)
Alkaline Phosphatase: 193 U/L — ABNORMAL HIGH (ref 39–117)
BUN: 9 mg/dL (ref 6–23)
CO2: 25 meq/L (ref 19–32)
Calcium: 9.7 mg/dL (ref 8.4–10.5)
Chloride: 95 meq/L — ABNORMAL LOW (ref 96–112)
Creatinine, Ser: 0.73 mg/dL (ref 0.50–1.10)
GFR calc Af Amer: >90 mL/min
GFR calc non Af Amer: >90 mL/min
Glucose, Bld: 116 mg/dL — ABNORMAL HIGH (ref 70–99)
Potassium: 3.6 meq/L (ref 3.5–5.1)
Sodium: 133 meq/L — ABNORMAL LOW (ref 135–145)
Total Bilirubin: 3.5 mg/dL — ABNORMAL HIGH (ref 0.3–1.2)
Total Protein: 7.6 g/dL (ref 6.0–8.3)

## 2011-09-22 LAB — CBC
HCT: 41.9 % (ref 36.0–46.0)
Hemoglobin: 14.2 g/dL (ref 12.0–15.0)
MCH: 31.7 pg (ref 26.0–34.0)
MCHC: 33.9 g/dL (ref 30.0–36.0)
MCV: 93.5 fL (ref 78.0–100.0)
Platelets: 234 K/uL (ref 150–400)
RBC: 4.48 MIL/uL (ref 3.87–5.11)
RDW: 13.6 % (ref 11.5–15.5)
WBC: 9.1 K/uL (ref 4.0–10.5)

## 2011-09-22 LAB — LACTIC ACID, PLASMA: Lactic Acid, Venous: 1.3 mmol/L (ref 0.5–2.2)

## 2011-09-22 LAB — DIFFERENTIAL
Basophils Absolute: 0 K/uL (ref 0.0–0.1)
Basophils Relative: 0 % (ref 0–1)
Eosinophils Absolute: 0.1 K/uL (ref 0.0–0.7)
Eosinophils Relative: 1 % (ref 0–5)
Lymphocytes Relative: 17 % (ref 12–46)
Lymphs Abs: 1.5 K/uL (ref 0.7–4.0)
Monocytes Absolute: 0.8 K/uL (ref 0.1–1.0)
Monocytes Relative: 9 % (ref 3–12)
Neutro Abs: 6.7 K/uL (ref 1.7–7.7)
Neutrophils Relative %: 73 % (ref 43–77)

## 2011-09-22 LAB — URINE MICROSCOPIC-ADD ON

## 2011-09-22 LAB — LIPASE, BLOOD: Lipase: 1259 U/L — ABNORMAL HIGH (ref 11–59)

## 2011-09-22 MED ORDER — FOLIC ACID 1 MG PO TABS
1.0000 mg | ORAL_TABLET | Freq: Every day | ORAL | Status: DC
  Administered 2011-09-22 – 2011-09-29 (×8): 1 mg via ORAL
  Filled 2011-09-22 (×8): qty 1

## 2011-09-22 MED ORDER — ONDANSETRON HCL 4 MG/2ML IJ SOLN
4.0000 mg | Freq: Once | INTRAMUSCULAR | Status: AC
  Administered 2011-09-22: 4 mg via INTRAVENOUS
  Filled 2011-09-22: qty 2

## 2011-09-22 MED ORDER — THIAMINE HCL 100 MG/ML IJ SOLN
Freq: Once | INTRAVENOUS | Status: AC
  Administered 2011-09-22: 17:00:00 via INTRAVENOUS
  Filled 2011-09-22: qty 1000

## 2011-09-22 MED ORDER — ONDANSETRON HCL 4 MG/2ML IJ SOLN
4.0000 mg | Freq: Three times a day (TID) | INTRAMUSCULAR | Status: DC | PRN
  Administered 2011-09-22: 4 mg via INTRAVENOUS
  Filled 2011-09-22: qty 2

## 2011-09-22 MED ORDER — HYDROMORPHONE HCL PF 1 MG/ML IJ SOLN
1.0000 mg | INTRAMUSCULAR | Status: DC | PRN
  Administered 2011-09-22 (×2): 1 mg via INTRAVENOUS
  Filled 2011-09-22 (×2): qty 1

## 2011-09-22 MED ORDER — KCL IN DEXTROSE-NACL 20-5-0.45 MEQ/L-%-% IV SOLN
INTRAVENOUS | Status: DC
  Administered 2011-09-23 – 2011-09-25 (×5): via INTRAVENOUS
  Filled 2011-09-22 (×7): qty 1000

## 2011-09-22 MED ORDER — VITAMIN B-1 100 MG PO TABS
100.0000 mg | ORAL_TABLET | Freq: Every day | ORAL | Status: DC
  Administered 2011-09-23 – 2011-09-29 (×7): 100 mg via ORAL
  Filled 2011-09-22 (×8): qty 1

## 2011-09-22 MED ORDER — HEPARIN SODIUM (PORCINE) 5000 UNIT/ML IJ SOLN
5000.0000 [IU] | Freq: Three times a day (TID) | INTRAMUSCULAR | Status: DC
  Administered 2011-09-22 – 2011-09-29 (×21): 5000 [IU] via SUBCUTANEOUS
  Filled 2011-09-22 (×23): qty 1

## 2011-09-22 MED ORDER — THIAMINE HCL 100 MG/ML IJ SOLN
100.0000 mg | Freq: Every day | INTRAMUSCULAR | Status: DC
Start: 2011-09-22 — End: 2011-09-29
  Administered 2011-09-22 – 2011-09-23 (×2): 100 mg via INTRAVENOUS
  Filled 2011-09-22 (×8): qty 1

## 2011-09-22 MED ORDER — HYDROMORPHONE HCL PF 1 MG/ML IJ SOLN
1.0000 mg | Freq: Once | INTRAMUSCULAR | Status: AC
  Administered 2011-09-22: 1 mg via INTRAVENOUS
  Filled 2011-09-22: qty 1

## 2011-09-22 MED ORDER — METOCLOPRAMIDE HCL 10 MG PO TABS
10.0000 mg | ORAL_TABLET | Freq: Three times a day (TID) | ORAL | Status: DC
  Administered 2011-09-22 – 2011-09-24 (×7): 10 mg via ORAL
  Filled 2011-09-22 (×11): qty 1

## 2011-09-22 MED ORDER — ADULT MULTIVITAMIN W/MINERALS CH
1.0000 | ORAL_TABLET | Freq: Every day | ORAL | Status: DC
  Administered 2011-09-22 – 2011-09-29 (×8): 1 via ORAL
  Filled 2011-09-22 (×8): qty 1

## 2011-09-22 MED ORDER — SODIUM CHLORIDE 0.9 % IV BOLUS (SEPSIS)
1000.0000 mL | Freq: Once | INTRAVENOUS | Status: AC
  Administered 2011-09-22: 1000 mL via INTRAVENOUS

## 2011-09-22 MED ORDER — ONDANSETRON HCL 4 MG PO TABS
4.0000 mg | ORAL_TABLET | Freq: Four times a day (QID) | ORAL | Status: DC | PRN
  Administered 2011-09-23: 4 mg via ORAL
  Filled 2011-09-22: qty 1

## 2011-09-22 MED ORDER — ONDANSETRON HCL 4 MG/2ML IJ SOLN: 4.0000 mg | Freq: Four times a day (QID) | INTRAMUSCULAR | Status: AC | PRN

## 2011-09-22 MED ORDER — LORAZEPAM 2 MG/ML IJ SOLN
1.0000 mg | Freq: Four times a day (QID) | INTRAMUSCULAR | Status: AC | PRN
  Administered 2011-09-22 – 2011-09-24 (×6): 1 mg via INTRAVENOUS
  Filled 2011-09-22 (×7): qty 1

## 2011-09-22 MED ORDER — DEXTROSE 5 % IV SOLN
1.0000 g | INTRAVENOUS | Status: DC
  Administered 2011-09-23 – 2011-09-25 (×3): 1 g via INTRAVENOUS
  Filled 2011-09-22 (×3): qty 10

## 2011-09-22 MED ORDER — DEXTROSE 5 % IV SOLN
1.0000 g | Freq: Once | INTRAVENOUS | Status: AC
  Administered 2011-09-22: 12:00:00 via INTRAVENOUS
  Filled 2011-09-22: qty 10

## 2011-09-22 MED ORDER — SODIUM CHLORIDE 0.9 % IV SOLN
INTRAVENOUS | Status: AC
  Administered 2011-09-22: 13:00:00 via INTRAVENOUS

## 2011-09-22 MED ORDER — LORAZEPAM 1 MG PO TABS
1.0000 mg | ORAL_TABLET | Freq: Four times a day (QID) | ORAL | Status: AC | PRN
  Administered 2011-09-23 – 2011-09-25 (×3): 1 mg via ORAL
  Filled 2011-09-22 (×3): qty 1

## 2011-09-22 MED ORDER — OXYCODONE HCL 5 MG PO TABS
5.0000 mg | ORAL_TABLET | ORAL | Status: DC | PRN
  Administered 2011-09-23 – 2011-09-25 (×5): 5 mg via ORAL
  Filled 2011-09-22 (×6): qty 1

## 2011-09-22 MED ORDER — HYDROMORPHONE HCL PF 1 MG/ML IJ SOLN
1.0000 mg | INTRAMUSCULAR | Status: DC | PRN
  Administered 2011-09-22 – 2011-09-24 (×13): 1 mg via INTRAVENOUS
  Filled 2011-09-22 (×13): qty 1

## 2011-09-22 NOTE — Progress Notes (Signed)
ANTIBIOTIC CONSULT NOTE - INITIAL  Pharmacy Consult for Rocephin Indication: UTI  Allergies  Allergen Reactions  . Ciprofloxacin     REACTION: nausea    Patient Measurements: Height: 5\' 6"  (167.6 cm) Weight: 155 lb 10.3 oz (70.6 kg) IBW/kg (Calculated) : 59.3    Vital Signs: Temp: 98.1 F (36.7 C) (06/04 1314) Temp src: Oral (06/04 1314) BP: 129/84 mmHg (06/04 1314) Pulse Rate: 73  (06/04 1314)        Labs:  Basename 09/22/11 1045  WBC 9.1  HGB 14.2  PLT 234  LABCREA --  CREATININE 0.73   Estimated Creatinine Clearance: 73.5 ml/min (by C-G formula based on Cr of 0.73). No results found for this basename: VANCOTROUGH:2,VANCOPEAK:2,VANCORANDOM:2,GENTTROUGH:2,GENTPEAK:2,GENTRANDOM:2,TOBRATROUGH:2,TOBRAPEAK:2,TOBRARND:2,AMIKACINPEAK:2,AMIKACINTROU:2,AMIKACIN:2, in the last 72 hours   Microbiology: No results found for this or any previous visit (from the past 720 hour(s)).  Medical History: Past Medical History  Diagnosis Date  . GERD (gastroesophageal reflux disease)   . Rosacea   . Elevated liver function tests   . Wears glasses   . Chronic diarrhea   . Pancreatitis 01/2010    2011 admission was second admission, 3rd in 02/2011  . Alcohol abuse     H/o withdrawal   . PONV (postoperative nausea and vomiting)     Medications:  Scheduled:    . sodium chloride   Intravenous STAT  . cefTRIAXone (ROCEPHIN)  IV  1 g Intravenous Once  . folic acid  1 mg Oral Daily  .  HYDROmorphone (DILAUDID) injection  1 mg Intravenous Once  . mulitivitamin with minerals  1 tablet Oral Daily  . ondansetron  4 mg Intravenous Once  . sodium chloride  1,000 mL Intravenous Once  . sodium chloride  1,000 mL Intravenous Once  . thiamine  100 mg Oral Daily   Or  . thiamine  100 mg Intravenous Daily   Infusions:   PRN: HYDROmorphone (DILAUDID) injection, HYDROmorphone (DILAUDID) injection, LORazepam, LORazepam, ondansetron (ZOFRAN) IV Assessment: [name] admitted with  abdominal pain and dysuria  Goal of Therapy:  Erradication of infection  Plan:   Patient received 1 Gram of Rocephin in ER at 1130am.   Continue with Rocephin 1 Gm IV q 24 hours.  Loletta Specter 09/22/2011,3:21 PM

## 2011-09-22 NOTE — H&P (Signed)
PCP:   Thayer Headings, MD, MD   Chief Complaint:  Abdominal discomfort  HPI: Patient is a 56 y/o CF with history of chronic pancreatitis as well as frequent alcohol intake (patient identified herself as an alcoholic) that presents to the ED with 4 day complaint of abdominal discomfort.  She states that it progressively got worse despite taking her home pain regimen.  Pain is located at epigastric area and is worsened when patient eats food.  She also endorses several days of dysuria.  She denies any BRBPR, hematuria, melanotic stools.    While in the ED patient was found to have a lipase level of 1259 with elevated AST/ALT 330/150 and alk phosphate level of 193.  WBC was within normal limits at 9.1 and urinalysis found cloudy urine with positive nitrite and moderate leukocytes.  In the ED was given Rocephin, Dilaudid, Zofran, and normal saline.  Allergies:   Allergies  Allergen Reactions  . Ciprofloxacin     REACTION: nausea      Past Medical History  Diagnosis Date  . GERD (gastroesophageal reflux disease)   . Rosacea   . Elevated liver function tests   . Wears glasses   . Chronic diarrhea   . Pancreatitis 01/2010    2011 admission was second admission, 3rd in 02/2011  . Alcohol abuse     H/o withdrawal   . PONV (postoperative nausea and vomiting)     Past Surgical History  Procedure Date  . Cholecystectomy   . Shoulder surgery [id number]  . Diagnostic mammogram 2008  . Tubal ligation   . Colonoscopy Never  . Esophagogastroduodenoscopy 03/21/2011    Procedure: ESOPHAGOGASTRODUODENOSCOPY (EGD);  Surgeon: Yancey Flemings, MD;  Location: [facility] ENDOSCOPY;  Service: Endoscopy;  Laterality: N/A;  Patient may need to be done at bedside tomorrow depending on status    Prior to Admission medications   Medication Sig Start Date End Date Taking? Authorizing Provider  HYDROmorphone (DILAUDID) 2 MG tablet Take 2 mg by mouth 3 (three) times daily as needed.   Yes Historical Provider, MD    LORazepam (ATIVAN) 1 MG tablet Take 1 mg by mouth 2 (two) times daily as needed.   Yes Historical Provider, MD  metoCLOPramide (REGLAN) 10 MG tablet Take 10 mg by mouth See admin instructions. Take 1 tab 30 min before each meal and once at bedtime as needed   Yes Historical Provider, MD  metoCLOPramide (REGLAN) 10 MG tablet Take 1 tablet (10 mg total) by mouth every 6 (six) hours as needed (nausea). 07/01/11 07/11/11  Dione Booze, MD    Social History:  reports that she quit smoking about 33 years ago. She has never used smokeless tobacco. She reports that she drinks about 1.5 - 2 ounces of alcohol per week. She reports that she does not use illicit drugs.  Family History  Problem Relation Age of Onset  . Stroke Mother   . Heart disease Father   . Heart disease Sister     Review of Systems:  Constitutional: Denies fever, chills, diaphoresis, appetite change and fatigue.  HEENT: Denies photophobia, eye pain, redness, hearing loss, ear pain, congestion, sore throat, rhinorrhea, sneezing, mouth sores, trouble swallowing, neck pain, neck stiffness and tinnitus.   Respiratory: Denies SOB, DOE, cough, chest tightness,  and wheezing.   Cardiovascular: Denies chest pain, palpitations and leg swelling.  Gastrointestinal: Denies nausea, vomiting, abdominal pain, diarrhea, constipation, blood in stool and abdominal distention.  Genitourinary: Denies dysuria, urgency, frequency, hematuria, flank pain and difficulty urinating.  Musculoskeletal: Denies myalgias, back pain, joint swelling, arthralgias and gait problem.  Skin: Denies pallor, rash and wound.  Neurological: Denies dizziness, seizures, syncope, weakness, light-headedness, numbness and headaches.  Hematological: Denies adenopathy. Easy bruising, personal or family bleeding history  Psychiatric/Behavioral: Denies suicidal ideation, mood changes, confusion, nervousness, sleep disturbance and agitation   Physical Exam: Blood pressure 129/84,  pulse 73, temperature 98.1 F (36.7 C), temperature source Oral, resp. rate 18, SpO2 96.00%.  General: Alert, awake, oriented x3, in no acute distress. HEENT: EOMI, PERRLA, No bruits, no goiter. Heart: Regular rate and rhythm, without murmurs, rubs, gallops. Lungs: Clear to auscultation bilaterally. Abdomen: Soft, + tender at epigastric area no rebound tenderness, nondistended, positive bowel sounds. Suprapubic tenderness.  Negative CVA tenderness Extremities: No clubbing cyanosis or edema with positive pedal pulses. Neuro: Grossly intact, nonfocal.  Labs on Admission:  Results for orders placed during the hospital encounter of 09/22/11 (from the past 48 hour(s))  URINALYSIS, ROUTINE W REFLEX MICROSCOPIC     Status: Abnormal   Collection Time   09/22/11 10:30 AM      Component Value Range Comment   Color, Urine ORANGE (*) YELLOW  BIOCHEMICALS MAY BE AFFECTED BY COLOR   APPearance CLOUDY (*) CLEAR     Specific Gravity, Urine 1.019  1.005 - 1.030     pH 6.0  5.0 - 8.0     Glucose, UA NEGATIVE  NEGATIVE (mg/dL)    Hgb urine dipstick NEGATIVE  NEGATIVE     Bilirubin Urine LARGE (*) NEGATIVE     Ketones, ur >80 (*) NEGATIVE (mg/dL)    Protein, ur NEGATIVE  NEGATIVE (mg/dL)    Urobilinogen, UA >1.3 (*) 0.0 - 1.0 (mg/dL)    Nitrite POSITIVE (*) NEGATIVE     Leukocytes, UA MODERATE (*) NEGATIVE    URINE MICROSCOPIC-ADD ON     Status: Abnormal   Collection Time   09/22/11 10:30 AM      Component Value Range Comment   Squamous Epithelial / LPF FEW (*) RARE     WBC, UA 7-10  <3 (WBC/hpf)    Bacteria, UA FEW (*) RARE     Urine-Other MUCOUS PRESENT     CBC     Status: Normal   Collection Time   09/22/11 10:45 AM      Component Value Range Comment   WBC 9.1  4.0 - 10.5 (K/uL)    RBC 4.48  3.87 - 5.11 (MIL/uL)    Hemoglobin 14.2  12.0 - 15.0 (g/dL)    HCT 08.6  57.8 - 46.9 (%)    MCV 93.5  78.0 - 100.0 (fL)    MCH 31.7  26.0 - 34.0 (pg)    MCHC 33.9  30.0 - 36.0 (g/dL)    RDW 62.9  52.8 -  41.3 (%)    Platelets 234  150 - 400 (K/uL)   DIFFERENTIAL     Status: Normal   Collection Time   09/22/11 10:45 AM      Component Value Range Comment   Neutrophils Relative 73  43 - 77 (%)    Neutro Abs 6.7  1.7 - 7.7 (K/uL)    Lymphocytes Relative 17  12 - 46 (%)    Lymphs Abs 1.5  0.7 - 4.0 (K/uL)    Monocytes Relative 9  3 - 12 (%)    Monocytes Absolute 0.8  0.1 - 1.0 (K/uL)    Eosinophils Relative 1  0 - 5 (%)    Eosinophils Absolute 0.1  0.0 - 0.7 (K/uL)    Basophils Relative 0  0 - 1 (%)    Basophils Absolute 0.0  0.0 - 0.1 (K/uL)   COMPREHENSIVE METABOLIC PANEL     Status: Abnormal   Collection Time   09/22/11 10:45 AM      Component Value Range Comment   Sodium 133 (*) 135 - 145 (mEq/L)    Potassium 3.6  3.5 - 5.1 (mEq/L)    Chloride 95 (*) 96 - 112 (mEq/L)    CO2 25  19 - 32 (mEq/L)    Glucose, Bld 116 (*) 70 - 99 (mg/dL)    BUN 9  6 - 23 (mg/dL)    Creatinine, Ser 1.61  0.50 - 1.10 (mg/dL)    Calcium 9.7  8.4 - 10.5 (mg/dL)    Total Protein 7.6  6.0 - 8.3 (g/dL)    Albumin 3.5  3.5 - 5.2 (g/dL)    AST 096 (*) 0 - 37 (U/L)    ALT 150 (*) 0 - 35 (U/L)    Alkaline Phosphatase 193 (*) 39 - 117 (U/L)    Total Bilirubin 3.5 (*) 0.3 - 1.2 (mg/dL)    GFR calc non Af Amer >90  >90 (mL/min)    GFR calc Af Amer >90  >90 (mL/min)   LIPASE, BLOOD     Status: Abnormal   Collection Time   09/22/11 10:45 AM      Component Value Range Comment   Lipase 1259 (*) 11 - 59 (U/L)   LACTIC ACID, PLASMA     Status: Normal   Collection Time   09/22/11 10:45 AM      Component Value Range Comment   Lactic Acid, Venous 1.3  0.5 - 2.2 (mmol/L)     Radiological Exams on Admission: No results found.  Assessment/Plan Active Problems: 1) Pancreatitis - IVF's - NPO  - Calcium within normal limits and patient is afebrile with normal WBC count favoring positive prognosis - Pain control - Advance diet once patient's abdominal discomfort ameliorates. - Obtain triglyceride levels although  suspect that pancreatic flair up was likely due to continued alcohol use.  2) UTI: - Continue Rocephin - Monitor WBC counts and vitals closely - Patient education  3) History of Alcohol Abuse - CIWA protocol - counseled patient to stop drinking alcohol.  4) Elevated liver transaminases -Patient has history of fatty liver but given continued alcohol use this may be related.   - Once pancreatitis subsides will plan on having patient follow up with a gastroenterologist as outpatient.  Time Spent on Admission: > 60 minutes discussing with patient and answering questions > 20 minutes, placing orders, physical exam, medical decision making, documenting, updating information systems, billing.  Penny Pia [facility] Pager: [phone] 09/22/2011, 2:01 PM

## 2011-09-22 NOTE — Plan of Care (Signed)
Problem: Phase I Progression Outcomes Goal: OOB as tolerated unless otherwise ordered Outcome: Completed/Met Date Met:  09/22/11 OOB with staff assistance

## 2011-09-22 NOTE — ED Notes (Addendum)
Pt reports chronic pancreatitis, pain since Sunday. Dilaudid at home, last took at 0630 today, ran out. PCP advised to come to ED. +nausea, denies vomiting.  Pt sts she last drank Friday because she gets DT's, normally drinks about a pint of etoh a week.

## 2011-09-22 NOTE — ED Provider Notes (Signed)
History     CSN: [id number]  Arrival date & time 09/22/11  [id number]   First MD Initiated Contact with Patient 09/22/11 [phone]      Chief Complaint  Patient presents with  . Abdominal Pain  . Nausea    (Consider location/radiation/quality/duration/timing/severity/associated sxs/prior treatment) HPI Comments: History of chronic pancreatitis. States this feels identical. Took all of her dilaudid at home  Patient is a 56 y.o. female presenting with abdominal pain. The history is provided by the patient. No language interpreter was used.  Abdominal Pain The primary symptoms of the illness include abdominal pain and nausea. The primary symptoms of the illness do not include fever, fatigue, shortness of breath, vomiting, diarrhea or dysuria. The current episode started more than 2 days ago (4 days ago). The onset of the illness was gradual. The problem has been gradually worsening.  The abdominal pain began more than 2 days ago. The pain came on gradually. The abdominal pain has been gradually worsening since its onset. The abdominal pain is located in the epigastric region. The abdominal pain does not radiate. The abdominal pain is relieved by nothing. The abdominal pain is exacerbated by eating.  Nausea began 2 days ago. The nausea is associated with eating. The nausea is exacerbated by food.  Symptoms associated with the illness do not include chills, constipation, urgency, frequency or back pain.    Past Medical History  Diagnosis Date  . GERD (gastroesophageal reflux disease)   . Rosacea   . Elevated liver function tests   . Wears glasses   . Chronic diarrhea   . Pancreatitis 01/2010    2011 admission was second admission, 3rd in 02/2011  . Alcohol abuse     H/o withdrawal     Past Surgical History  Procedure Date  . Cholecystectomy   . Shoulder surgery [id number]  . Diagnostic mammogram 2008  . Tubal ligation   . Colonoscopy Never  . Esophagogastroduodenoscopy 03/21/2011   Procedure: ESOPHAGOGASTRODUODENOSCOPY (EGD);  Surgeon: Yancey Flemings, MD;  Location: [facility] ENDOSCOPY;  Service: Endoscopy;  Laterality: N/A;  Patient may need to be done at bedside tomorrow depending on status    Family History  Problem Relation Age of Onset  . Stroke Mother   . Heart disease Father   . Heart disease Sister     History  Substance Use Topics  . Smoking status: Former Games developer  . Smokeless tobacco: Never Used  . Alcohol Use: 1.5 - 2.0 oz/week    3-4 drink(s) per week     pint per week    OB History    Grav Para Term Preterm Abortions TAB SAB Ect Mult Living                  Review of Systems  Constitutional: Negative for fever, chills, activity change, appetite change and fatigue.  HENT: Negative for congestion, sore throat, rhinorrhea, neck pain and neck stiffness.   Respiratory: Negative for cough and shortness of breath.   Cardiovascular: Negative for chest pain and palpitations.  Gastrointestinal: Positive for nausea and abdominal pain. Negative for vomiting, diarrhea and constipation.  Genitourinary: Negative for dysuria, urgency, frequency and flank pain.  Musculoskeletal: Negative for myalgias, back pain and arthralgias.  Neurological: Negative for dizziness, light-headedness, numbness and headaches.  All other systems reviewed and are negative.    Allergies  Ciprofloxacin  Home Medications   Current Outpatient Rx  Name Route Sig Dispense Refill  . HYDROMORPHONE HCL 2 MG PO TABS Oral Take 2  mg by mouth 3 (three) times daily as needed.    Marland Kitchen LORAZEPAM 1 MG PO TABS Oral Take 1 mg by mouth 2 (two) times daily as needed.    Marland Kitchen METOCLOPRAMIDE HCL 10 MG PO TABS Oral Take 10 mg by mouth See admin instructions. Take 1 tab 30 min before each meal and once at bedtime as needed    . METOCLOPRAMIDE HCL 10 MG PO TABS Oral Take 1 tablet (10 mg total) by mouth every 6 (six) hours as needed (nausea). 30 tablet 0    BP 131/84  Pulse 69  Temp(Src) 98.3 F (36.8 C)  (Oral)  Resp 19  SpO2 98%  Physical Exam  Nursing note and vitals reviewed. Constitutional: She is oriented to person, place, and time. She appears well-developed and well-nourished. No distress.  HENT:  Head: Normocephalic and atraumatic.  Mouth/Throat: Oropharynx is clear and moist.  Eyes: Conjunctivae and EOM are normal. Pupils are equal, round, and reactive to light.  Neck: Normal range of motion. Neck supple.  Cardiovascular: Normal rate, regular rhythm and intact distal pulses.  Exam reveals no gallop and no friction rub.   No murmur heard. Pulmonary/Chest: Effort normal and breath sounds normal. No respiratory distress. She exhibits no tenderness.  Abdominal: Soft. Bowel sounds are normal. There is tenderness (epigastric tenderness). There is no rebound and no guarding.  Musculoskeletal: Normal range of motion. She exhibits no edema and no tenderness.  Neurological: She is alert and oriented to person, place, and time. No cranial nerve deficit.  Skin: Skin is warm and dry. No rash noted.    ED Course  Procedures (including critical care time)   Date: 09/22/2011  Rate: 74  Rhythm: normal sinus rhythm  QRS Axis: normal  Intervals: normal  ST/T Wave abnormalities: normal  Conduction Disutrbances:none  Narrative Interpretation:   Old EKG Reviewed: unchanged  Labs Reviewed  COMPREHENSIVE METABOLIC PANEL - Abnormal; Notable for the following:    Sodium 133 (*)    Chloride 95 (*)    Glucose, Bld 116 (*)    AST 330 (*)    ALT 150 (*)    Alkaline Phosphatase 193 (*)    Total Bilirubin 3.5 (*)    All other components within normal limits  LIPASE, BLOOD - Abnormal; Notable for the following:    Lipase 1259 (*)    All other components within normal limits  URINALYSIS, ROUTINE W REFLEX MICROSCOPIC - Abnormal; Notable for the following:    Color, Urine ORANGE (*) BIOCHEMICALS MAY BE AFFECTED BY COLOR   APPearance CLOUDY (*)    Bilirubin Urine LARGE (*)    Ketones, ur >80  (*)    Urobilinogen, UA >8.0 (*)    Nitrite POSITIVE (*)    Leukocytes, UA MODERATE (*)    All other components within normal limits  URINE MICROSCOPIC-ADD ON - Abnormal; Notable for the following:    Squamous Epithelial / LPF FEW (*)    Bacteria, UA FEW (*)    All other components within normal limits  CBC  DIFFERENTIAL  LACTIC ACID, PLASMA   No results found.   1. Pancreatitis, acute   2. Elevated liver function tests   3. UTI (lower urinary tract infection)       MDM  Pancreatitis with a lipase of 1259. Did not perform an ultrasound as she is status post cholecystectomy which is remote. She has a slight elevation of her liver enzymes. She also has a urinary tract infection. Placed on Rocephin. We'll  administered IV fluids as she has evidence of dehydration. She be placed on a maintenance rate. She received 2 L of IV fluids. Pain and nausea medication. Discussed the [facility] who accepted the patient for admission.        Dayton Bailiff, MD 09/22/11 1157

## 2011-09-23 ENCOUNTER — Inpatient Hospital Stay (HOSPITAL_COMMUNITY): Payer: Self-pay

## 2011-09-23 DIAGNOSIS — F1021 Alcohol dependence, in remission: Secondary | ICD-10-CM

## 2011-09-23 DIAGNOSIS — E782 Mixed hyperlipidemia: Secondary | ICD-10-CM

## 2011-09-23 DIAGNOSIS — K859 Acute pancreatitis without necrosis or infection, unspecified: Secondary | ICD-10-CM

## 2011-09-23 DIAGNOSIS — R740 Nonspecific elevation of levels of transaminase and lactic acid dehydrogenase [LDH]: Secondary | ICD-10-CM

## 2011-09-23 LAB — CBC
MCH: 30.7 pg (ref 26.0–34.0)
MCHC: 31.5 g/dL (ref 30.0–36.0)
MCV: 97.4 fL (ref 78.0–100.0)
Platelets: 201 10*3/uL (ref 150–400)
RDW: 13.9 % (ref 11.5–15.5)

## 2011-09-23 LAB — LIPID PANEL
HDL: 49 mg/dL (ref >39)
LDL Cholesterol: 78 mg/dL (ref 0–99)
Total CHOL/HDL Ratio: 3.4 RATIO
Triglycerides: 200 mg/dL — ABNORMAL HIGH (ref <150)

## 2011-09-23 LAB — COMPREHENSIVE METABOLIC PANEL
AST: 111 U/L — ABNORMAL HIGH (ref 0–37)
CO2: 22 mEq/L (ref 19–32)
Calcium: 8.6 mg/dL (ref 8.4–10.5)
Creatinine, Ser: 0.72 mg/dL (ref 0.50–1.10)
GFR calc non Af Amer: >90 mL/min (ref >90)
Total Protein: 6.2 g/dL (ref 6.0–8.3)

## 2011-09-23 MED ORDER — PANTOPRAZOLE SODIUM 40 MG PO TBEC
40.0000 mg | DELAYED_RELEASE_TABLET | Freq: Every day | ORAL | Status: DC
  Administered 2011-09-24 – 2011-09-26 (×3): 40 mg via ORAL
  Filled 2011-09-23 (×5): qty 1

## 2011-09-23 MED ORDER — ONDANSETRON HCL 4 MG PO TABS
4.0000 mg | ORAL_TABLET | ORAL | Status: DC | PRN
  Administered 2011-09-24 – 2011-09-29 (×11): 4 mg via ORAL
  Filled 2011-09-23 (×11): qty 1

## 2011-09-23 MED ORDER — SIMETHICONE 80 MG PO CHEW
160.0000 mg | CHEWABLE_TABLET | Freq: Once | ORAL | Status: AC
  Administered 2011-09-23: 160 mg via ORAL
  Filled 2011-09-23: qty 2

## 2011-09-23 NOTE — Progress Notes (Signed)
Subjective: patient seen and examined today. Still c/o epigastric pain but improved compared to previous days. Has nausea off and on.  Objective:  Vital signs in last 24 hours:  Filed Vitals:   09/22/11 2135 09/23/11 0400 09/23/11 0635 09/23/11 0700  BP: 119/75 118/72 116/73   Pulse: 83 89 91   Temp: 98.4 F (36.9 C)  98.1 F (36.7 C)   TempSrc: Oral  Oral   Resp: 18  18   Height:      Weight:      SpO2: 93%  86% 96%    Intake/Output from previous day:   Intake/Output Summary (Last 24 hours) at 09/23/11 1258 Last data filed at 09/23/11 0847  Gross per 24 hour  Intake 2691.67 ml  Output   1375 ml  Net 1316.67 ml    Physical Exam:  General: middle aged female in no acute distress. HEENT: no pallor, no icterus, moist oral mucosa, no JVD, no lymphadenopathy Heart: Normal  s1 &s2  Regular rate and rhythm, without murmurs, rubs, gallops. Lungs: Clear to auscultation bilaterally. Abdomen: Soft, tender on deep palpation over epigastrium nondistended, positive bowel sounds. Extremities: No clubbing cyanosis or edema with positive pedal pulses. Neuro: Alert, awake, oriented x3, nonfocal. No tremors   Lab Results:  Basic Metabolic Panel:    Component Value Date/Time   NA 134* 09/23/2011 0454   K 4.1 09/23/2011 0454   CL 101 09/23/2011 0454   CO2 22 09/23/2011 0454   BUN 6 09/23/2011 0454   CREATININE 0.72 09/23/2011 0454   GLUCOSE 96 09/23/2011 0454   CALCIUM 8.6 09/23/2011 0454   CBC:    Component Value Date/Time   WBC 8.7 09/23/2011 0454   HGB 11.9* 09/23/2011 0454   HCT 37.8 09/23/2011 0454   PLT 201 09/23/2011 0454   MCV 97.4 09/23/2011 0454   NEUTROABS 6.7 09/22/2011 1045   LYMPHSABS 1.5 09/22/2011 1045   MONOABS 0.8 09/22/2011 1045   EOSABS 0.1 09/22/2011 1045   BASOSABS 0.0 09/22/2011 1045    No results found for this or any previous visit (from the past 240 hour(s)).  Studies/Results: No results found.  Medications: Scheduled Meds:   . sodium chloride   Intravenous STAT  .  cefTRIAXone (ROCEPHIN)  IV  1 g Intravenous Q24H  . folic acid  1 mg Oral Daily  . heparin  5,000 Units Subcutaneous Q8H  . metoCLOPramide  10 mg Oral TID AC & HS  . mulitivitamin with minerals  1 tablet Oral Daily  . pantoprazole  40 mg Oral Q1200  . general admission iv infusion   Intravenous Once  . thiamine  100 mg Oral Daily   Or  . thiamine  100 mg Intravenous Daily   Continuous Infusions:   . dextrose 5 % and 0.45 % NaCl with KCl 20 mEq/L 100 mL/hr at 09/23/11 0321   PRN Meds:.HYDROmorphone (DILAUDID) injection, LORazepam, LORazepam, ondansetron (ZOFRAN) IV, ondansetron, oxyCODONE, DISCONTD:  HYDROmorphone (DILAUDID) injection, DISCONTD: ondansetron (ZOFRAN) IV, DISCONTD: ondansetron  Assessment/Plan:  1) acute recurrent etoh Pancreatitis  - continue NPO  -IV fluids -cont prn dilaudid and oxycodone for pain  -prn zofran - Advance diet once patient's abdominal discomfort imporves - patient has ongoing etoh use and counseled strongly on quitting it.  she was last admitted in nov 2012 at [facility] for acute pancreatitis. EGD done by Dr Marina Goodell at that time showed erosive esophagitis and recommended on chronic PPI  2) UTI:  Urine cx pending - Continue Rocephin ( day  2) - Monitor WBC counts and vitals closely   3) History of Alcohol Abuse  - CIWA protocol  - counseled patient to stop drinking alcohol.   4) Elevated liver transaminases  Likely related to acute alcoholic hepatitis -Patient has history of fatty liver. Check liver [facility] LFTs trending down this am. She should follow up with Dr Marina Goodell as outpatient   DVT prophylaxis   LOS: 1 day   [name] 09/23/2011, 12:58 PM

## 2011-09-24 DIAGNOSIS — K700 Alcoholic fatty liver: Secondary | ICD-10-CM

## 2011-09-24 DIAGNOSIS — K859 Acute pancreatitis without necrosis or infection, unspecified: Secondary | ICD-10-CM

## 2011-09-24 DIAGNOSIS — F1021 Alcohol dependence, in remission: Secondary | ICD-10-CM

## 2011-09-24 DIAGNOSIS — R740 Nonspecific elevation of levels of transaminase and lactic acid dehydrogenase [LDH]: Secondary | ICD-10-CM

## 2011-09-24 LAB — HEPATIC FUNCTION PANEL
ALT: 91 U/L — ABNORMAL HIGH (ref 0–35)
AST: 89 U/L — ABNORMAL HIGH (ref 0–37)
Bilirubin, Direct: 0.4 mg/dL — ABNORMAL HIGH (ref 0.0–0.3)
Total Bilirubin: 0.8 mg/dL (ref 0.3–1.2)

## 2011-09-24 LAB — URINE CULTURE
Colony Count: NO GROWTH
Culture  Setup Time: 201306050825
Culture: NO GROWTH
Special Requests: NORMAL

## 2011-09-24 MED ORDER — HYDROMORPHONE HCL PF 1 MG/ML IJ SOLN
1.0000 mg | Freq: Four times a day (QID) | INTRAMUSCULAR | Status: DC | PRN
  Administered 2011-09-24 – 2011-09-25 (×3): 1 mg via INTRAVENOUS
  Filled 2011-09-24 (×3): qty 1

## 2011-09-24 MED ORDER — METOCLOPRAMIDE HCL 10 MG PO TABS
5.0000 mg | ORAL_TABLET | Freq: Four times a day (QID) | ORAL | Status: DC | PRN
  Administered 2011-09-26 – 2011-09-29 (×6): 5 mg via ORAL
  Filled 2011-09-24 (×6): qty 1

## 2011-09-24 NOTE — Progress Notes (Signed)
Subjective: Patient feels better today regarding her epigastric pain and nausea. No vomiting.   Objective:  Vital signs in last 24 hours:  Filed Vitals:   09/23/11 1433 09/23/11 2200 09/23/11 2240 09/24/11 0640  BP: 137/82 120/73  119/79  Pulse: 84 84  80  Temp: 98.2 F (36.8 C) 98.4 F (36.9 C)  98 F (36.7 C)  TempSrc: Oral Oral  Oral  Resp: 18 20  18   Height:      Weight:      SpO2: 92% 87% 98% 90%    Intake/Output from previous day:   Intake/Output Summary (Last 24 hours) at 09/24/11 1219 Last data filed at 09/23/11 1758  Gross per 24 hour  Intake      0 ml  Output    700 ml  Net   -700 ml    Physical Exam:  General:middle aged  in no acute distress. HEENT: no pallor, no icterus, moist oral mucosa, no JVD, no lymphadenopathy Heart: Normal  s1 &s2  Regular rate and rhythm, without murmurs, rubs, gallops. Lungs: Clear to auscultation bilaterally. Abdomen: Soft, minimal epigastric tenderness , nondistended, positive bowel sounds. Extremities: No clubbing cyanosis or edema with positive pedal pulses. Neuro: Alert, awake, oriented x3, nonfocal. No tremors.   Lab Results:  Basic Metabolic Panel:    Component Value Date/Time   NA 134* 09/23/2011 0454   K 4.1 09/23/2011 0454   CL 101 09/23/2011 0454   CO2 22 09/23/2011 0454   BUN 6 09/23/2011 0454   CREATININE 0.72 09/23/2011 0454   GLUCOSE 96 09/23/2011 0454   CALCIUM 8.6 09/23/2011 0454   CBC:    Component Value Date/Time   WBC 8.7 09/23/2011 0454   HGB 11.9* 09/23/2011 0454   HCT 37.8 09/23/2011 0454   PLT 201 09/23/2011 0454   MCV 97.4 09/23/2011 0454   NEUTROABS 6.7 09/22/2011 1045   LYMPHSABS 1.5 09/22/2011 1045   MONOABS 0.8 09/22/2011 1045   EOSABS 0.1 09/22/2011 1045   BASOSABS 0.0 09/22/2011 1045    Recent Results (from the past 240 hour(s))  URINE CULTURE     Status: Normal   Collection Time   09/23/11  6:04 AM      Component Value Range Status Comment   Specimen Description URINE, CLEAN CATCH   Final    Special  Requests Normal   Final    Culture  Setup Time [id number]   Final    Colony Count NO GROWTH   Final    Culture NO GROWTH   Final    Report Status 09/24/2011 FINAL   Final     Studies/Results: US Abdomen Complete  09/23/2011  *RADIOLOGY REPORT*  Clinical Data:  Elevated LFTs, EtOH hepatitis, pancreatitis  COMPLETE ABDOMINAL ULTRASOUND  Comparison:  CT abdomen pelvis dated 03/18/2011  Findings:  Gallbladder:  Status post cholecystectomy.  Common bile duct:  Measures 13 mm, likely unchanged from prior CT.  Liver:  No focal lesion identified.  Mildly increased parenchymal echogenicity, likely reflecting hepatic steatosis  IVC:  Appears normal.  Pancreas:  Not visualized secondary to overlying bowel gas.  Spleen:  Measures 6.9 cm.  Right Kidney:  Measures 10.5 cm.  No mass or hydronephrosis.  Left Kidney:  Measures 11.0 cm.  No mass or hydronephrosis.  Abdominal aorta:  No aneurysm identified.  IMPRESSION: Status post cholecystectomy.  Common duct measures 13 mm, likely unchanged from prior CT.  Suspected mild hepatic steatosis.  No focal hepatic lesion is seen.  Original Report Authenticated  By: Charline Bills, M.D.    Medications: Scheduled Meds:   . cefTRIAXone (ROCEPHIN)  IV  1 g Intravenous Q24H  . folic acid  1 mg Oral Daily  . heparin  5,000 Units Subcutaneous Q8H  . multivitamin with minerals  1 tablet Oral Daily  . pantoprazole  40 mg Oral Q1200  . simethicone  160 mg Oral Once  . thiamine  100 mg Oral Daily   Or  . thiamine  100 mg Intravenous Daily  . DISCONTD: metoCLOPramide  10 mg Oral TID AC & HS   Continuous Infusions:   . dextrose 5 % and 0.45 % NaCl with KCl 20 mEq/L 100 mL/hr at 09/24/11 0225   PRN Meds:.HYDROmorphone (DILAUDID) injection, LORazepam, LORazepam, metoCLOPramide, ondansetron, oxyCODONE, DISCONTD:  HYDROmorphone (DILAUDID) injection, DISCONTD: ondansetron   Assessment/Plan:  1) acute recurrent etoh Pancreatitis  -cont IV fluids  -cont prn dilaudid will  decrease frequency to q6hrs.  -continue prn zofran and PPI - Start on clear liquids - patient has ongoing etoh use and counseled strongly on quitting it.  she was last admitted in nov 2012 at [facility] for acute pancreatitis. EGD done by Dr Marina Goodell at that time showed erosive esophagitis and recommended on chronic PPI   2) UTI:  Urine cx negative  - Continue Rocephin ( day 3) , d/c abx after today - Monitor WBC counts and vitals closely   3) History of Alcohol Abuse  - CIWA protocol  - counseled patient to stop drinking alcohol.  -no signs of withdrawal currently   4) Elevated liver transaminases  Likely related to acute alcoholic hepatitis  -Patient has history of fatty liver. liver USG shows fatty liver LFTs trending down . She should follow up with Dr Marina Goodell as outpatient  DVT prophylaxis   Full code   LOS: 2 days   Lilliana Turner 09/24/2011, 12:19 PM

## 2011-09-25 DIAGNOSIS — R740 Nonspecific elevation of levels of transaminase and lactic acid dehydrogenase [LDH]: Secondary | ICD-10-CM

## 2011-09-25 DIAGNOSIS — K859 Acute pancreatitis without necrosis or infection, unspecified: Secondary | ICD-10-CM

## 2011-09-25 DIAGNOSIS — K700 Alcoholic fatty liver: Secondary | ICD-10-CM

## 2011-09-25 DIAGNOSIS — F1021 Alcohol dependence, in remission: Secondary | ICD-10-CM

## 2011-09-25 LAB — BASIC METABOLIC PANEL
BUN: <3 mg/dL — ABNORMAL LOW (ref 6–23)
Creatinine, Ser: 0.76 mg/dL (ref 0.50–1.10)
GFR calc Af Amer: >90 mL/min (ref >90)
GFR calc non Af Amer: >90 mL/min (ref >90)
Potassium: 4 mEq/L (ref 3.5–5.1)

## 2011-09-25 LAB — HEPATIC FUNCTION PANEL
Bilirubin, Direct: 0.2 mg/dL (ref 0.0–0.3)
Indirect Bilirubin: 0.3 mg/dL (ref 0.3–0.9)

## 2011-09-25 MED ORDER — HYDROMORPHONE HCL PF 1 MG/ML IJ SOLN
1.0000 mg | Freq: Four times a day (QID) | INTRAMUSCULAR | Status: DC | PRN
  Administered 2011-09-25 – 2011-09-26 (×4): 1 mg via INTRAVENOUS
  Filled 2011-09-25 (×5): qty 1

## 2011-09-25 MED ORDER — OXYCODONE HCL 5 MG PO TABS
5.0000 mg | ORAL_TABLET | Freq: Four times a day (QID) | ORAL | Status: DC | PRN
  Administered 2011-09-25: 5 mg via ORAL
  Filled 2011-09-25 (×2): qty 1

## 2011-09-25 MED ORDER — OXYCODONE HCL 5 MG PO TABS
5.0000 mg | ORAL_TABLET | ORAL | Status: DC | PRN
  Administered 2011-09-25 – 2011-09-29 (×12): 5 mg via ORAL
  Filled 2011-09-25 (×14): qty 1

## 2011-09-25 MED ORDER — OXYCODONE HCL 5 MG PO TABS: 5.0000 mg | ORAL_TABLET | Freq: Once | ORAL | Status: DC

## 2011-09-25 MED ORDER — OXYCODONE HCL 5 MG PO TABS
5.0000 mg | ORAL_TABLET | Freq: Once | ORAL | Status: AC
  Administered 2011-09-25: 5 mg via ORAL

## 2011-09-25 NOTE — Progress Notes (Signed)
Subjective: Patient feeling better today. Epigastric pain has been improving. Still has some nausea but improving.  Objective:  Vital signs in last 24 hours:  Filed Vitals:   09/24/11 0640 09/24/11 1437 09/24/11 2230 09/25/11 0600  BP: 119/79 129/67 127/77 135/80  Pulse: 80 78 86 83  Temp: 98 F (36.7 C) 97.8 F (36.6 C) 98.6 F (37 C) 97.6 F (36.4 C)  TempSrc: Oral Oral Oral Oral  Resp: 18 18 20 20   Height:      Weight:      SpO2: 90% 94% 95% 92%    Intake/Output from previous day:   Intake/Output Summary (Last 24 hours) at 09/25/11 1327 Last data filed at 09/25/11 0600  Gross per 24 hour  Intake   2450 ml  Output      0 ml  Net   2450 ml    Physical Exam:   General:middle aged in no acute distress.  HEENT: no pallor, no icterus, moist oral mucosa, no JVD, no lymphadenopathy  Heart: Normal s1 &s2 Regular rate and rhythm, without murmurs, rubs, gallops.  Lungs: Clear to auscultation bilaterally.  Abdomen: Soft, minimal epigastric tenderness [improving] , nondistended, positive bowel sounds.  Extremities: No clubbing cyanosis or edema with positive pedal pulses.  Neuro: Alert, awake, oriented x3, nonfocal. No tremors.   Lab Results:  Basic Metabolic Panel:    Component Value Date/Time   NA 134* 09/25/2011 0435   K 4.0 09/25/2011 0435   CL 100 09/25/2011 0435   CO2 27 09/25/2011 0435   BUN <3* 09/25/2011 0435   CREATININE 0.76 09/25/2011 0435   GLUCOSE 129* 09/25/2011 0435   CALCIUM 9.1 09/25/2011 0435   CBC:    Component Value Date/Time   WBC 8.7 09/23/2011 0454   HGB 11.9* 09/23/2011 0454   HCT 37.8 09/23/2011 0454   PLT 201 09/23/2011 0454   MCV 97.4 09/23/2011 0454   NEUTROABS 6.7 09/22/2011 1045   LYMPHSABS 1.5 09/22/2011 1045   MONOABS 0.8 09/22/2011 1045   EOSABS 0.1 09/22/2011 1045   BASOSABS 0.0 09/22/2011 1045    Recent Results (from the past 240 hour(s))  URINE CULTURE     Status: Normal   Collection Time   09/23/11  6:04 AM      Component Value Range Status Comment    Specimen Description URINE, CLEAN CATCH   Final    Special Requests Normal   Final    Culture  Setup Time [id number]   Final    Colony Count NO GROWTH   Final    Culture NO GROWTH   Final    Report Status 09/24/2011 FINAL   Final     Studies/Results: US Abdomen Complete  09/23/2011  *RADIOLOGY REPORT*  Clinical Data:  Elevated LFTs, EtOH hepatitis, pancreatitis  COMPLETE ABDOMINAL ULTRASOUND  Comparison:  CT abdomen pelvis dated 03/18/2011  Findings:  Gallbladder:  Status post cholecystectomy.  Common bile duct:  Measures 13 mm, likely unchanged from prior CT.  Liver:  No focal lesion identified.  Mildly increased parenchymal echogenicity, likely reflecting hepatic steatosis  IVC:  Appears normal.  Pancreas:  Not visualized secondary to overlying bowel gas.  Spleen:  Measures 6.9 cm.  Right Kidney:  Measures 10.5 cm.  No mass or hydronephrosis.  Left Kidney:  Measures 11.0 cm.  No mass or hydronephrosis.  Abdominal aorta:  No aneurysm identified.  IMPRESSION: Status post cholecystectomy.  Common duct measures 13 mm, likely unchanged from prior CT.  Suspected mild hepatic steatosis.  No  focal hepatic lesion is seen.  Original Report Authenticated By: Charline Bills, M.D.    Medications: Scheduled Meds:   . folic acid  1 mg Oral Daily  . heparin  5,000 Units Subcutaneous Q8H  . multivitamin with minerals  1 tablet Oral Daily  . oxyCODONE  5 mg Oral Once  . pantoprazole  40 mg Oral Q1200  . thiamine  100 mg Oral Daily   Or  . thiamine  100 mg Intravenous Daily  . DISCONTD: cefTRIAXone (ROCEPHIN)  IV  1 g Intravenous Q24H   Continuous Infusions:   . DISCONTD: dextrose 5 % and 0.45 % NaCl with KCl 20 mEq/L 100 mL/hr at 09/25/11 0830   PRN Meds:.LORazepam, LORazepam, metoCLOPramide, ondansetron, oxyCODONE, DISCONTD:  HYDROmorphone (DILAUDID) injection, DISCONTD: oxyCODONE  Assessment/Plan:  1) acute recurrent etoh Pancreatitis   -Symptoms improving. Discontinued dilaudid. Continue  as needed oxycodone -continue prn zofran and PPI  - Tolerating clear liquids . Advance diet to regular - patient has ongoing etoh use and counseled strongly on quitting it. She informs having participated in different programs including alcoholic anonymous and does not want to seek help or  see a SW at this time. she was last admitted in nov 2012 at [facility] for acute pancreatitis. EGD done by Dr Marina Goodell at that time showed erosive esophagitis and recommended on chronic PPI   2) UTI:  Urine cx negative  - Completed Rocephin   3) History of Alcohol Abuse  - CIWA protocol  - counseled patient to stop drinking alcohol.  -no signs of withdrawal currently   4) Elevated liver transaminases  Likely related to acute alcoholic hepatitis  -Patient has history of fatty liver. liver [facility] shows fatty liver  LFTs trending down . She should follow up with Dr Marina Goodell as outpatient  DVT prophylaxis   Full code    can be discharged home tomorrow if remained stable overnight     LOS: 3 days   [name] 09/25/2011, 1:27 PM

## 2011-09-26 LAB — COMPREHENSIVE METABOLIC PANEL
BUN: <3 mg/dL — ABNORMAL LOW (ref 6–23)
CO2: 27 mEq/L (ref 19–32)
Chloride: 100 mEq/L (ref 96–112)
Creatinine, Ser: 0.8 mg/dL (ref 0.50–1.10)
GFR calc Af Amer: >90 mL/min (ref >90)
GFR calc non Af Amer: 81 mL/min — ABNORMAL LOW (ref >90)
Total Bilirubin: 2.2 mg/dL — ABNORMAL HIGH (ref 0.3–1.2)

## 2011-09-26 LAB — LIPASE, BLOOD: Lipase: 67 U/L — ABNORMAL HIGH (ref 11–59)

## 2011-09-26 MED ORDER — LORAZEPAM 1 MG PO TABS
1.0000 mg | ORAL_TABLET | Freq: Once | ORAL | Status: AC
  Administered 2011-09-26: 1 mg via ORAL
  Filled 2011-09-26: qty 1

## 2011-09-26 MED ORDER — HYDROMORPHONE HCL PF 1 MG/ML IJ SOLN
1.0000 mg | INTRAMUSCULAR | Status: DC | PRN
  Administered 2011-09-26 – 2011-09-27 (×3): 1 mg via INTRAVENOUS
  Filled 2011-09-26 (×3): qty 1

## 2011-09-26 MED ORDER — POTASSIUM CHLORIDE CRYS ER 20 MEQ PO TBCR
40.0000 meq | EXTENDED_RELEASE_TABLET | Freq: Two times a day (BID) | ORAL | Status: DC
  Administered 2011-09-26 – 2011-09-27 (×2): 40 meq via ORAL
  Filled 2011-09-26 (×3): qty 2

## 2011-09-26 MED ORDER — SODIUM CHLORIDE 0.9 % IV SOLN
INTRAVENOUS | Status: DC
  Administered 2011-09-26: 22:00:00 via INTRAVENOUS

## 2011-09-26 NOTE — Progress Notes (Signed)
Pt c/o constant abdominal pain. Pt not tolerating clear liquids. Pt also having moderate tremors and agitation.Pt is very tearful. MD on call at [facility] Notified. Symptoms were addressed.

## 2011-09-26 NOTE — Progress Notes (Signed)
Subjective: Patient seen and examined this morning. Had worsening of her abdominal pain yesterday after advancing the diet. No vomiting but still has nausea. Now switched back to clear liquids and dilaudid resumed.   Objective:  Vital signs in last 24 hours:  Filed Vitals:   09/25/11 0600 09/25/11 1417 09/25/11 2230 09/26/11 0700  BP: 135/80 142/81 125/78 105/63  Pulse: 83 85 78 72  Temp: 97.6 F (36.4 C) 98.4 F (36.9 C) 98.3 F (36.8 C) 98.2 F (36.8 C)  TempSrc: Oral Oral Oral Oral  Resp: 20 20 18 18   Height:      Weight:      SpO2: 92% 93% 93% 96%    Intake/Output from previous day:   Intake/Output Summary (Last 24 hours) at 09/26/11 1024 Last data filed at 09/25/11 1300  Gross per 24 hour  Intake    240 ml  Output      0 ml  Net    240 ml    Physical Exam:  General:middle aged in no acute distress.  HEENT: no pallor, no icterus, moist oral mucosa, no JVD, no lymphadenopathy  Heart: Normal s1 &s2 Regular rate and rhythm, without murmurs, rubs, gallops.  Lungs: Clear to auscultation bilaterally.  Abdomen: Soft, tender epigastrium , nondistended, positive bowel sounds.  Extremities: No clubbing cyanosis or edema with positive pedal pulses.  Neuro: Alert, awake, oriented x3, nonfocal. No tremors.    Lab Results:  Basic Metabolic Panel:    Component Value Date/Time   NA 134* 09/25/2011 0435   K 4.0 09/25/2011 0435   CL 100 09/25/2011 0435   CO2 27 09/25/2011 0435   BUN <3* 09/25/2011 0435   CREATININE 0.76 09/25/2011 0435   GLUCOSE 129* 09/25/2011 0435   CALCIUM 9.1 09/25/2011 0435   CBC:    Component Value Date/Time   WBC 8.7 09/23/2011 0454   HGB 11.9* 09/23/2011 0454   HCT 37.8 09/23/2011 0454   PLT 201 09/23/2011 0454   MCV 97.4 09/23/2011 0454   NEUTROABS 6.7 09/22/2011 1045   LYMPHSABS 1.5 09/22/2011 1045   MONOABS 0.8 09/22/2011 1045   EOSABS 0.1 09/22/2011 1045   BASOSABS 0.0 09/22/2011 1045    Recent Results (from the past 240 hour(s))  URINE CULTURE     Status:  Normal   Collection Time   09/23/11  6:04 AM      Component Value Range Status Comment   Specimen Description URINE, CLEAN CATCH   Final    Special Requests Normal   Final    Culture  Setup Time [id number]   Final    Colony Count NO GROWTH   Final    Culture NO GROWTH   Final    Report Status 09/24/2011 FINAL   Final     Studies/Results: No results found.  Medications: Scheduled Meds:   . folic acid  1 mg Oral Daily  . heparin  5,000 Units Subcutaneous Q8H  . multivitamin with minerals  1 tablet Oral Daily  . oxyCODONE  5 mg Oral Once  . pantoprazole  40 mg Oral Q1200  . thiamine  100 mg Oral Daily   Or  . thiamine  100 mg Intravenous Daily  . DISCONTD: cefTRIAXone (ROCEPHIN)  IV  1 g Intravenous Q24H  . DISCONTD: oxyCODONE  5 mg Oral Once   Continuous Infusions:   . DISCONTD: dextrose 5 % and 0.45 % NaCl with KCl 20 mEq/L 100 mL/hr at 09/25/11 0830   PRN Meds:.HYDROmorphone (DILAUDID) injection, LORazepam, LORazepam,  metoCLOPramide, ondansetron, oxyCODONE, DISCONTD:  HYDROmorphone (DILAUDID) injection, DISCONTD: oxyCODONE, DISCONTD: oxyCODONE  Assessment/Plan:  1) acute recurrent etoh Pancreatitis  Had  elevated lipase of 1200 on presentation.  -patient slowly improved but Symptoms worsened as diet advanced on 6/7 . -resumed diauldid and placed back to clear liquid diet.. Continue as needed oxycodone  -recheck lipase . if symptoms worsen with get CT of the abdomen. -continue prn zofran and PPI  - Tolerating clear liquids . Advance diet to regular  - patient has ongoing etoh use and counseled strongly on quitting it. She informs having participated in different programs including alcoholic anonymous and does not want to seek help or see a SW at this time.  she was last admitted in nov 2012 at [facility] for acute pancreatitis. EGD done by Dr Marina Goodell at that time showed erosive esophagitis and recommended on chronic PPI.  2) UTI:  Urine cx negative  - Completed Rocephin   3)  History of Alcohol Abuse  - CIWA protocol  - counseled patient to stop drinking alcohol.  -no signs of withdrawal currently   4) Elevated liver transaminases  Likely related to acute alcoholic hepatitis  -Patient has history of fatty liver. liver [facility] shows fatty liver  LFTs trending down . She should follow up with Dr Marina Goodell as outpatient  DVT prophylaxis   Full code         LOS: 4 days   Estie Sproule 09/26/2011, 10:24 AM

## 2011-09-27 ENCOUNTER — Inpatient Hospital Stay (HOSPITAL_COMMUNITY): Payer: Self-pay

## 2011-09-27 DIAGNOSIS — R740 Nonspecific elevation of levels of transaminase and lactic acid dehydrogenase [LDH]: Secondary | ICD-10-CM

## 2011-09-27 DIAGNOSIS — K859 Acute pancreatitis without necrosis or infection, unspecified: Secondary | ICD-10-CM

## 2011-09-27 DIAGNOSIS — F1021 Alcohol dependence, in remission: Secondary | ICD-10-CM

## 2011-09-27 MED ORDER — HYDROMORPHONE HCL PF 2 MG/ML IJ SOLN
2.0000 mg | Freq: Once | INTRAMUSCULAR | Status: AC
  Administered 2011-09-27: 2 mg via INTRAVENOUS

## 2011-09-27 MED ORDER — PANTOPRAZOLE SODIUM 40 MG PO TBEC
40.0000 mg | DELAYED_RELEASE_TABLET | Freq: Two times a day (BID) | ORAL | Status: DC
  Administered 2011-09-27 – 2011-09-29 (×5): 40 mg via ORAL
  Filled 2011-09-27 (×7): qty 1

## 2011-09-27 MED ORDER — POTASSIUM CHLORIDE CRYS ER 20 MEQ PO TBCR
40.0000 meq | EXTENDED_RELEASE_TABLET | Freq: Every day | ORAL | Status: DC
  Filled 2011-09-27: qty 2

## 2011-09-27 MED ORDER — LORAZEPAM 1 MG PO TABS
1.0000 mg | ORAL_TABLET | Freq: Two times a day (BID) | ORAL | Status: DC
  Administered 2011-09-27 – 2011-09-29 (×5): 1 mg via ORAL
  Filled 2011-09-27 (×5): qty 1

## 2011-09-27 MED ORDER — SENNOSIDES-DOCUSATE SODIUM 8.6-50 MG PO TABS
1.0000 | ORAL_TABLET | Freq: Two times a day (BID) | ORAL | Status: DC
  Administered 2011-09-27 – 2011-09-29 (×5): 1 via ORAL
  Filled 2011-09-27 (×6): qty 1

## 2011-09-27 MED ORDER — IOHEXOL 300 MG/ML  SOLN
100.0000 mL | Freq: Once | INTRAMUSCULAR | Status: AC | PRN
  Administered 2011-09-27: 100 mL via INTRAVENOUS

## 2011-09-27 MED ORDER — HYDROMORPHONE HCL PF 1 MG/ML IJ SOLN: 1.0000 mg | Freq: Four times a day (QID) | INTRAMUSCULAR | Status: DC | PRN

## 2011-09-27 MED ORDER — HYDROMORPHONE HCL PF 1 MG/ML IJ SOLN
0.5000 mg | INTRAMUSCULAR | Status: DC | PRN
  Administered 2011-09-27: 2 mg via INTRAVENOUS
  Administered 2011-09-27 – 2011-09-29 (×10): 0.5 mg via INTRAVENOUS
  Filled 2011-09-27 (×9): qty 1
  Filled 2011-09-27: qty 2
  Filled 2011-09-27: qty 1

## 2011-09-27 NOTE — Progress Notes (Signed)
Subjective: Patient had worsening abdominal pain yesterday medication adjusted and feels fine on current pain medications. Is willing to try advance diet today  Objective:  Vital signs in last 24 hours:  Filed Vitals:   09/25/11 2230 09/26/11 0700 09/26/11 2225 09/27/11 0616  BP: 125/78 105/63 137/80 111/76  Pulse: 78 72 73 84  Temp: 98.3 F (36.8 C) 98.2 F (36.8 C) 98.4 F (36.9 C) 98.3 F (36.8 C)  TempSrc: Oral Oral Oral Oral  Resp: 18 18 18 20   Height:      Weight:      SpO2: 93% 96% 97% 90%    Intake/Output from previous day:  No intake or output data in the 24 hours ending 09/27/11 1425  Physical Exam:   General:middle aged in no acute distress.  HEENT: no pallor, no icterus, moist oral mucosa, no JVD, no lymphadenopathy  Heart: Normal s1 &s2 Regular rate and rhythm, without murmurs, rubs, gallops.  Lungs: Clear to auscultation bilaterally.  Abdomen: Soft, tender epigastrium , nondistended, positive bowel sounds.  Extremities: No clubbing cyanosis or edema with positive pedal pulses.  Neuro: Alert, awake, oriented x3, nonfocal. No tremors.   Lab Results:  Basic Metabolic Panel:    Component Value Date/Time   NA 136 09/26/2011 1122   K 3.4* 09/26/2011 1122   CL 100 09/26/2011 1122   CO2 27 09/26/2011 1122   BUN <3* 09/26/2011 1122   CREATININE 0.80 09/26/2011 1122   GLUCOSE 105* 09/26/2011 1122   CALCIUM 9.4 09/26/2011 1122   CBC:    Component Value Date/Time   WBC 8.7 09/23/2011 0454   HGB 11.9* 09/23/2011 0454   HCT 37.8 09/23/2011 0454   PLT 201 09/23/2011 0454   MCV 97.4 09/23/2011 0454   NEUTROABS 6.7 09/22/2011 1045   LYMPHSABS 1.5 09/22/2011 1045   MONOABS 0.8 09/22/2011 1045   EOSABS 0.1 09/22/2011 1045   BASOSABS 0.0 09/22/2011 1045    Recent Results (from the past 240 hour(s))  URINE CULTURE     Status: Normal   Collection Time   09/23/11  6:04 AM      Component Value Range Status Comment   Specimen Description URINE, CLEAN CATCH   Final    Special Requests Normal    Final    Culture  Setup Time [id number]   Final    Colony Count NO GROWTH   Final    Culture NO GROWTH   Final    Report Status 09/24/2011 FINAL   Final     Studies/Results: No results found.  Medications: Scheduled Meds:   . folic acid  1 mg Oral Daily  . heparin  5,000 Units Subcutaneous Q8H  . LORazepam  1 mg Oral Once  . LORazepam  1 mg Oral BID  . multivitamin with minerals  1 tablet Oral Daily  . pantoprazole  40 mg Oral BID AC  . potassium chloride  40 mEq Oral Daily  . senna-docusate  1 tablet Oral BID  . thiamine  100 mg Oral Daily   Or  . thiamine  100 mg Intravenous Daily  . DISCONTD: pantoprazole  40 mg Oral Q1200  . DISCONTD: potassium chloride  40 mEq Oral BID   Continuous Infusions:   . sodium chloride 50 mL/hr at 09/26/11 2201   PRN Meds:.HYDROmorphone (DILAUDID) injection, metoCLOPramide, ondansetron, oxyCODONE, DISCONTD:  HYDROmorphone (DILAUDID) injection, DISCONTD:  HYDROmorphone (DILAUDID) injection, DISCONTD:  HYDROmorphone (DILAUDID) injection  Assessment/Plan:  1) acute recurrent etoh Pancreatitis  Had elevated lipase of 1200  on presentation.  -patient slowly improved but Symptoms worsened as diet advanced on 6/7 .  -resumed diauldid and placed back to clear liquid diet.. Continue as needed oxycodone  - lipase Stable -continue prn zofran and PPI  - on  clear liquids . Could  Not tolerate Advance diet yesterday - patient has ongoing etoh use and counseled strongly on quitting it. She informs having participated in different programs including alcoholic anonymous and does not want to seek help or see a SW at this time.  she was last admitted in nov 2012 at [facility] for acute pancreatitis. EGD done by Dr Marina Goodell at that time showed erosive esophagitis and recommended on chronic PPI.  2) UTI:  Urine cx negative  - Completed Rocephin   3) History of Alcohol Abuse  - CIWA protocol  - counseled patient to stop drinking alcohol.  -no signs of withdrawal  currently   4) Elevated liver transaminases  Likely related to acute alcoholic hepatitis  -Patient has history of fatty liver. liver [facility] shows fatty liver  LFTs trending down . She should follow up with Dr Marina Goodell as outpatient  DVT prophylaxis   Full code         LOS: 5 days   [name] 09/27/2011, 2:25 PM

## 2011-09-28 DIAGNOSIS — R740 Nonspecific elevation of levels of transaminase and lactic acid dehydrogenase [LDH]: Secondary | ICD-10-CM

## 2011-09-28 DIAGNOSIS — K859 Acute pancreatitis without necrosis or infection, unspecified: Secondary | ICD-10-CM

## 2011-09-28 DIAGNOSIS — F1021 Alcohol dependence, in remission: Secondary | ICD-10-CM

## 2011-09-28 LAB — COMPREHENSIVE METABOLIC PANEL
ALT: 77 U/L — ABNORMAL HIGH (ref 0–35)
AST: 72 U/L — ABNORMAL HIGH (ref 0–37)
Calcium: 9.4 mg/dL (ref 8.4–10.5)
GFR calc Af Amer: >90 mL/min (ref >90)
Sodium: 135 mEq/L (ref 135–145)
Total Protein: 6.9 g/dL (ref 6.0–8.3)

## 2011-09-28 NOTE — Progress Notes (Signed)
CARE MANAGEMENT NOTE 09/28/2011  Patient:  [name], [name]   Account Number:  [id number]  Date Initiated:  09/23/2011  Documentation initiated by:  Robb Sibal  Subjective/Objective Assessment:   patient with abd pain admitted for source and pain control     Action/Plan:   lives at home   Anticipated DC Date:  09/26/2011   Anticipated DC Plan:  HOME/SELF CARE  In-house referral  NA      DC Planning Services  NA      [facility] Choice  NA   Choice offered to / List presented to:  NA   DME arranged  NA      DME agency  NA     HH arranged  NA      HH agency  NA   Status of service:  In process, will continue to follow Medicare Important Message given?  NA - LOS <3 / Initial given by admissions (If response is "NO", the following Medicare IM given date fields will be blank) Date Medicare IM given:   Date Additional Medicare IM given:    Discharge Disposition:    Per UR Regulation:  Reviewed for med. necessity/level of care/duration of stay  If discussed at Long Length of Stay Meetings, dates discussed:    Comments:  [id number] Marcelle Smiling, RN,BSN,CCM No discharge needs present at time of this review Case Management [phone]

## 2011-09-28 NOTE — Progress Notes (Signed)
Subjective: Patient seen and examined this am. Pain much better today  Objective:  Vital signs in last 24 hours:  Filed Vitals:   09/27/11 1719 09/27/11 2257 09/28/11 0634 09/28/11 1421  BP: 160/77 118/77 108/74 112/75  Pulse: 81 82 76 71  Temp: 98 F (36.7 C) 98.2 F (36.8 C) 98.2 F (36.8 C) 97.8 F (36.6 C)  TempSrc: Oral Oral Oral Oral  Resp: 20 20 20 19   Height:      Weight:      SpO2: 95% 92% 92% 92%    Intake/Output from previous day:   Intake/Output Summary (Last 24 hours) at 09/28/11 1513 Last data filed at 09/28/11 1100  Gross per 24 hour  Intake    480 ml  Output      0 ml  Net    480 ml    Physical Exam:  General:middle aged in no acute distress.  HEENT: no pallor, no icterus, moist oral mucosa, no JVD, no lymphadenopathy  Heart: Normal s1 &s2 Regular rate and rhythm, without murmurs, rubs, gallops.  Lungs: Clear to auscultation bilaterally.  Abdomen: Soft, tender epigastrium , nondistended, positive bowel sounds.  Extremities: No clubbing cyanosis or edema with positive pedal pulses.  Neuro: Alert, awake, oriented x3, nonfocal. No tremors.    Lab Results:  Basic Metabolic Panel:    Component Value Date/Time   NA 135 09/28/2011 0515   K 4.1 09/28/2011 0515   CL 99 09/28/2011 0515   CO2 26 09/28/2011 0515   BUN 3* 09/28/2011 0515   CREATININE 0.69 09/28/2011 0515   GLUCOSE 87 09/28/2011 0515   CALCIUM 9.4 09/28/2011 0515   CBC:    Component Value Date/Time   WBC 8.7 09/23/2011 0454   HGB 11.9* 09/23/2011 0454   HCT 37.8 09/23/2011 0454   PLT 201 09/23/2011 0454   MCV 97.4 09/23/2011 0454   NEUTROABS 6.7 09/22/2011 1045   LYMPHSABS 1.5 09/22/2011 1045   MONOABS 0.8 09/22/2011 1045   EOSABS 0.1 09/22/2011 1045   BASOSABS 0.0 09/22/2011 1045    Recent Results (from the past 240 hour(s))  URINE CULTURE     Status: Normal   Collection Time   09/23/11  6:04 AM      Component Value Range Status Comment   Specimen Description URINE, CLEAN CATCH   Final    Special  Requests Normal   Final    Culture  Setup Time [id number]   Final    Colony Count NO GROWTH   Final    Culture NO GROWTH   Final    Report Status 09/24/2011 FINAL   Final     Studies/Results: Ct Abdomen Pelvis W Contrast  09/28/2011  *RADIOLOGY REPORT*  Clinical Data: Epigastric pain.  Acute pancreatitis.  Chronic diarrhea.  CT ABDOMEN AND PELVIS WITH CONTRAST  Technique:  Multidetector CT imaging of the abdomen and pelvis was performed following the standard protocol during bolus administration of intravenous contrast.  Contrast: OMNIPAQUE IOHEXOL 300 MG/ML  SOLN  Comparison: Ultrasound of 09/23/2011.  CT 03/18/2011.  Findings: Patchy bibasilar atelectasis.  Heart size upper normal, without pericardial or pleural effusion.  Moderate hiatal hernia.  Moderate hepatic steatosis.  No focal liver lesion.  Normal spleen and stomach.  Improved appearance of pancreas since 03/18/2011.  Nearly completely resolved edema surrounding the pancreatic head.  Residual edema suspected on image 36.  Pancreatic head cystic lesion measures 2.2 x 2.0 cm on image 32 versus 1.7 cm on the prior.  A collection  cephalad the pancreatic neck is improved and measures 1.4 cm today; 2.3 cm on the prior. Other presumed pseudocysts have resolved.  The pancreatic duct is upper normal in the region of the head without focal duct obstruction identified.  Cholecystectomy.  Moderate intrahepatic biliary ductal dilatation is new.  The common duct measures 1.3 cm on image 30 versus 9 mm at the level on the prior.  Suspect a dependent 3 mm distal common duct stone on image 38.  No vascular complication.  Mild left adrenal thickening.  Normal right adrenal gland.  Tiny bilateral renal cysts.  Mild celiac axis stenosis with poststenotic dilatation.  Advanced for age the aortic atherosclerosis. No retroperitoneal or retrocrural adenopathy.  Scattered colonic diverticula.  Normal terminal ileum and appendix. Normal small bowel without  abdominal ascites.    No pelvic adenopathy.    Normal urinary bladder and uterus.  No adnexal mass.  No significant free fluid.  No acute osseous abnormality.  IMPRESSION:  1.  Improved appearance of the pancreas.  A pseudocyst within the pancreatic head has enlarged slightly.  Other pseudocysts are improved and resolved.  Decreased peripancreatic inflammation. Recurrent mild pancreatitis cannot be excluded. 2.  Development of common duct dilatation after cholecystectomy. Cannot exclude tiny distal common duct stone.  Ductal dilatation is favored to be secondary to mass effect from the residual pancreatic head pseudocyst. 3.  Hepatic steatosis. 4.  Moderate hiatal hernia.  Original Report Authenticated By: Consuello Bossier, M.D.    Medications: Scheduled Meds:   . folic acid  1 mg Oral Daily  . heparin  5,000 Units Subcutaneous Q8H  .  HYDROmorphone (DILAUDID) injection  2 mg Intravenous Once  . LORazepam  1 mg Oral BID  . multivitamin with minerals  1 tablet Oral Daily  . pantoprazole  40 mg Oral BID AC  . senna-docusate  1 tablet Oral BID  . thiamine  100 mg Oral Daily   Or  . thiamine  100 mg Intravenous Daily  . DISCONTD: potassium chloride  40 mEq Oral Daily   Continuous Infusions:   . sodium chloride 50 mL/hr at 09/26/11 2201   PRN Meds:.HYDROmorphone (DILAUDID) injection, iohexol, metoCLOPramide, ondansetron, oxyCODONE   Assessment/Plan:   1) acute recurrent etoh Pancreatitis  Had elevated lipase of 1200 on presentation.  -patient slowly improved but Symptoms worsened as diet advanced on 6/7 .  -resumed diauldid and placed back to clear liquid diet.. Continue as needed oxycodone  - lipase Stable  -continue prn zofran and PPI  - on full liquids. Will continue  the same - patient has ongoing etoh use and counseled strongly on quitting it. She informs having participated in different programs including alcoholic anonymous and does not want to seek help or see a SW at this time.    she was last admitted in nov 2012 at [facility] for acute pancreatitis. EGD done by Dr Marina Goodell at that time showed erosive esophagitis and recommended on chronic PPI.  CT abdomen done on 6/9 for persistent abdominal pain . Comments as "  A pseudocyst within the  pancreatic head has enlarged slightly. Other pseudocysts are  improved and resolved. Decreased peripancreatic inflammation. " Asked [facility] to evaluate pt but was informed that she was fired from all services for non complaicne    2) UTI:  Urine cx negative  - Completed Rocephin   3) History of Alcohol Abuse  - CIWA protocol  - counseled patient to stop drinking alcohol.  -no signs of withdrawal  currently   4) Elevated liver transaminases  Likely related to acute alcoholic hepatitis  -Patient has history of fatty liver. liver [facility] shows fatty liver  LFTs trending down .   DVT prophylaxis   Full code          LOS: 6 days   Raequon Catanzaro 09/28/2011, 3:13 PM

## 2011-09-29 DIAGNOSIS — N390 Urinary tract infection, site not specified: Secondary | ICD-10-CM | POA: Diagnosis present

## 2011-09-29 DIAGNOSIS — K700 Alcoholic fatty liver: Secondary | ICD-10-CM

## 2011-09-29 DIAGNOSIS — K863 Pseudocyst of pancreas: Secondary | ICD-10-CM | POA: Diagnosis present

## 2011-09-29 DIAGNOSIS — F1021 Alcohol dependence, in remission: Secondary | ICD-10-CM

## 2011-09-29 DIAGNOSIS — E782 Mixed hyperlipidemia: Secondary | ICD-10-CM

## 2011-09-29 DIAGNOSIS — K859 Acute pancreatitis without necrosis or infection, unspecified: Secondary | ICD-10-CM

## 2011-09-29 HISTORY — DX: Pseudocyst of pancreas: K86.3

## 2011-09-29 MED ORDER — LORAZEPAM 0.5 MG PO TABS
0.5000 mg | ORAL_TABLET | Freq: Once | ORAL | Status: AC
  Administered 2011-09-29: 0.5 mg via ORAL
  Filled 2011-09-29: qty 1

## 2011-09-29 MED ORDER — THIAMINE HCL 100 MG PO TABS: 100.0000 mg | ORAL_TABLET | Freq: Every day | ORAL | Status: DC

## 2011-09-29 MED ORDER — METOCLOPRAMIDE HCL 5 MG PO TABS: 5.0000 mg | ORAL_TABLET | Freq: Four times a day (QID) | ORAL | Status: DC | PRN

## 2011-09-29 MED ORDER — OXYCODONE HCL 5 MG PO TABS: 5.0000 mg | ORAL_TABLET | ORAL | Status: AC | PRN

## 2011-09-29 MED ORDER — SENNOSIDES-DOCUSATE SODIUM 8.6-50 MG PO TABS: 1.0000 | ORAL_TABLET | Freq: Every day | ORAL | Status: DC

## 2011-09-29 MED ORDER — PANTOPRAZOLE SODIUM 40 MG PO TBEC: 40.0000 mg | DELAYED_RELEASE_TABLET | Freq: Two times a day (BID) | ORAL | Status: DC

## 2011-09-29 MED ORDER — FOLIC ACID 1 MG PO TABS: 1.0000 mg | ORAL_TABLET | Freq: Every day | ORAL | Status: DC

## 2011-09-29 NOTE — Discharge Summary (Signed)
Patient ID: [name] MRN: [id number] DOB/AGE: 1955-05-05 56 y.o.  Admit date: 09/22/2011 Discharge date: 09/29/2011  Primary Care Physician:  Thayer Headings, MD, MD  Discharge Diagnoses:     Principal Problem: Acute etoh Pancreatitis with pancreatic pseudocyst  Active Problems: Alcohol abuse  UTI (lower urinary tract infection)  Depression Fatty liver    Medication List  As of 09/29/2011  3:23 PM   STOP taking these medications         HYDROmorphone 2 MG tablet      metoCLOPramide 10 MG tablet         TAKE these medications         folic acid 1 MG tablet   Commonly known as: FOLVITE   Take 1 tablet (1 mg total) by mouth daily.      LORazepam 1 MG tablet   Commonly known as: ATIVAN   Take 1 mg by mouth 2 (two) times daily as needed.      metoCLOPramide 5 MG tablet   Commonly known as: REGLAN   Take 1 tablet (5 mg total) by mouth every 6 (six) hours as needed (nausea).      oxyCODONE 5 MG immediate release tablet   Commonly known as: Oxy IR/ROXICODONE   Take 1 tablet (5 mg total) by mouth every 4 (four) hours as needed for pain.      pantoprazole 40 MG tablet   Commonly known as: PROTONIX   Take 1 tablet (40 mg total) by mouth 2 (two) times daily before a meal.      senna-docusate 8.6-50 MG per tablet   Commonly known as: Senokot-S   Take 1 tablet by mouth daily.      thiamine 100 MG tablet   Take 1 tablet (100 mg total) by mouth daily.            Disposition and Follow-up:  Home with outpt PCP follow up  Consults: none  Significant Diagnostic Studies:  RADIOLOGY REPORT*  Clinical Data: Epigastric pain. Acute pancreatitis. Chronic  diarrhea.  CT ABDOMEN AND PELVIS WITH CONTRAST  Technique: Multidetector CT imaging of the abdomen and pelvis was  performed following the standard protocol during bolus  administration of intravenous contrast.  Contrast: OMNIPAQUE IOHEXOL 300 MG/ML SOLN  Comparison: Ultrasound of 09/23/2011. CT  03/18/2011.  Findings: Patchy bibasilar atelectasis. Heart size upper normal,  without pericardial or pleural effusion. Moderate hiatal hernia.  Moderate hepatic steatosis. No focal liver lesion.  Normal spleen and stomach. Improved appearance of pancreas since  03/18/2011. Nearly completely resolved edema surrounding the  pancreatic head. Residual edema suspected on image 36. Pancreatic  head cystic lesion measures 2.2 x 2.0 cm on image 32 versus 1.7 cm  on the prior.  A collection cephalad the pancreatic neck is improved and measures  1.4 cm today; 2.3 cm on the prior. Other presumed pseudocysts have  resolved.  The pancreatic duct is upper normal in the region of the head  without focal duct obstruction identified.  Cholecystectomy. Moderate intrahepatic biliary ductal dilatation  is new. The common duct measures 1.3 cm on image 30 versus 9 mm at  the level on the prior. Suspect a dependent 3 mm distal common  duct stone on image 38.  No vascular complication.  Mild left adrenal thickening. Normal right adrenal gland. Tiny  bilateral renal cysts.  Mild celiac axis stenosis with poststenotic dilatation. Advanced  for age the aortic atherosclerosis. No retroperitoneal or  retrocrural adenopathy.  Scattered colonic diverticula. Normal  terminal ileum and appendix.  Normal small bowel without abdominal ascites.  No pelvic adenopathy. Normal urinary bladder and uterus. No  adnexal mass. No significant free fluid. No acute osseous  abnormality.  IMPRESSION:  1. Improved appearance of the pancreas. A pseudocyst within the  pancreatic head has enlarged slightly. Other pseudocysts are  improved and resolved. Decreased peripancreatic inflammation.  Recurrent mild pancreatitis cannot be excluded.  2. Development of common duct dilatation after cholecystectomy.  Cannot exclude tiny distal common duct stone. Ductal dilatation is  favored to be secondary to mass effect from the residual  pancreatic  head pseudocyst.  3. Hepatic steatosis.  4. Moderate hiatal hernia.   Brief H and P: For complete details please refer to admission H and P, but in brief 56 y/o CF with history of chronic pancreatitis as well as frequent alcohol intake (patient identified herself as an alcoholic) that presents to the ED with 4 day complaint of abdominal discomfort. She states that it progressively got worse despite taking her home pain regimen. Pain is located at epigastric area and is worsened when patient eats food. She also endorses several days of dysuria. She denies any BRBPR, hematuria, melanotic stools.  While in the ED patient was found to have a lipase level of 1259 with elevated AST/ALT 330/150 and alk phosphate level of 193. WBC was within normal limits at 9.1 and urinalysis found cloudy urine with positive nitrite and moderate leukocytes   Physical Exam on Discharge:  Filed Vitals:   09/28/11 0634 09/28/11 1421 09/28/11 2158 09/29/11 1418  BP: 108/74 112/75 110/60 123/67  Pulse: 76 71 64 79  Temp: 98.2 F (36.8 C) 97.8 F (36.6 C) 98.1 F (36.7 C) 98.5 F (36.9 C)  TempSrc: Oral Oral Oral Oral  Resp: 20 19 18 20   Height:      Weight:      SpO2: 92% 92% 94% 94%     Intake/Output Summary (Last 24 hours) at 09/29/11 1523 Last data filed at 09/28/11 2000  Gross per 24 hour  Intake   1000 ml  Output      0 ml  Net   1000 ml    General: Alert, awake, oriented x3, in no acute distress. HEENT: No bruits, no goiter. Heart: Regular rate and rhythm, without murmurs, rubs, gallops. Lungs: Clear to auscultation bilaterally. Abdomen: Soft, mild tenderness over epigastric area,, nondistended, positive bowel sounds. Extremities: No clubbing cyanosis or edema with positive pedal pulses. Neuro: Grossly intact, nonfocal.  CBC:    Component Value Date/Time   WBC 8.7 09/23/2011 0454   HGB 11.9* 09/23/2011 0454   HCT 37.8 09/23/2011 0454   PLT 201 09/23/2011 0454   MCV 97.4 09/23/2011  0454   NEUTROABS 6.7 09/22/2011 1045   LYMPHSABS 1.5 09/22/2011 1045   MONOABS 0.8 09/22/2011 1045   EOSABS 0.1 09/22/2011 1045   BASOSABS 0.0 09/22/2011 1045    Basic Metabolic Panel:    Component Value Date/Time   NA 135 09/28/2011 0515   K 4.1 09/28/2011 0515   CL 99 09/28/2011 0515   CO2 26 09/28/2011 0515   BUN 3* 09/28/2011 0515   CREATININE 0.69 09/28/2011 0515   GLUCOSE 87 09/28/2011 0515   CALCIUM 9.4 09/28/2011 0515    Hospital Course:   1) acute recurrent etoh Pancreatitis  Had elevated lipase of 1200 on presentation.  -patient slowly improved but Symptoms worsened as diet advanced on 6/7 .  -resumed diauldid and placed back to clear liquid diet..- lipase  now  Stable  - patient has ongoing etoh use and counseled strongly on quitting it. She informs having participated in different programs including alcoholic anonymous and does not want to seek help or see a SW at this time.  she was last admitted in nov 2012 at [facility] for acute pancreatitis. EGD done by Dr Marina Goodell at that time showed erosive esophagitis and recommended on chronic PPI.  CT abdomen done on 6/9 for persistent abdominal pain . Comments as " A pseudocyst within the  pancreatic head has enlarged slightly. Other pseudocysts are  improved and resolved. Decreased peripancreatic inflammation. "  Asked [facility] to evaluate pt but was informed that she was fired from all services for non compliance.  her abdominal pan now improving. She has been advanced diet to regular and tolerated well today she can be discharged home on few days of pain medications and follow up as outpatient.    2) UTI:  Urine cx negative  - Completed Rocephin   3) History of Alcohol Abuse  - no signs of withdrawal - counseled patient to stop drinking alcohol.   4) Elevated liver transaminases  Likely related to acute alcoholic hepatitis  -Patient has history of fatty liver. liver [facility] shows fatty liver  LFTs trending down .   Patient stable for  discharge home  with PCP follow up as outpt.   Time spent on Discharge: 45 minutes  Signed: Eddie North 09/29/2011, 3:23 PM

## 2011-09-29 NOTE — Discharge Instructions (Addendum)
Acute Pancreatitis The pancreas is a large gland located behind your stomach. It produces (secretes) enzymes. These enzymes help digest food. It also releases the hormones glucagon and insulin. These hormones help regulate blood sugar. When the pancreas becomes inflamed, the disease is called pancreatitis. Inflammation of the pancreas occurs when enzymes from the pancreas begin attacking and digesting the pancreas. CAUSES  Most cases ofsudden onset (acute) pancreatitis are caused by:  Alcohol abuse.   Gallstones.  Other less common causes are:  Some medications.   Exposure to certain chemicals   Infection.   Damage caused by an accident (trauma).   Surgery of the belly (abdomen).  SYMPTOMS  Acute pancreatitis usually begins with pain in the upper abdomen and may radiate to the back. This pain may last a couple days. The constant pain varies from mild to severe. The acute form of this disease may vary from mild, nonspecific abdominal pain to profound shock with coma. About 1 in 5 cases are severe. These patients become dehydrated and develop low blood pressure. In severe cases, bleeding into the pancreas can lead to shock and death. The lungs, heart, and kidneys may fail. DIAGNOSIS  Your caregiver will form a clinical opinion after giving you an exam. Laboratory work is used to confirm this diagnosis. Often,a digestive enzyme from the pancreas (serum amylase) and other enzymes are elevated. Sugars and fats (lipids) in the blood may be elevated. There may also be changes in the following levels: calcium, magnesium, potassium, chloride and bicarbonate (chemicals in the blood). X-rays, a CT scan, or ultrasound of your abdomen may be necessary to search for other causes of your abdominal pain. TREATMENT  Most pancreatitis requires treatment of symptoms. Most acute attacks last a couple of days. Your caregiver can discuss the treatment options with you.  If complications occur, hospitalization  may be necessary for pain control and intravenous (IV) fluid replacement.   Sometimes, a tube may be put into the stomach to control vomiting.   Food may not be allowed for 3 to 4 days. This gives the pancreas time to rest. Giving the pancreas a rest means there is no stimulation that would produce more enzymes and cause more damage.   Medicines (antibiotics) that kill germs may be given if infection is the cause.   Sometimes, surgery may be required.   Following an acute attack, your caregiver will determine the cause, if possible, and offer suggestions to prevent recurrences.  HOME CARE INSTRUCTIONS   Eat smaller, more frequent meals. This reduces the amount of digestive juices the pancreas produces.   Decrease the amount of fat in your diet. This may help reduce loose, diarrheal stools.   Drink enough water and fluids to keep your urine clear or pale yellow. This is to avoid dehydration which can cause increased pain.   Talk to your caregiver about pain relievers or other medicines that may help.   Avoid anything that may have triggered your pancreatitis (for example, alcohol).   Follow the diet advised by your caregiver. Do not advance the diet too soon.   Take medicines as prescribed.   Get plenty of rest.   Check your blood sugar at home as directed by your caregiver.   If your caregiver has given you a follow-up appointment, it is very important to keep that appointment. Not keeping the appointment could result in a lasting (chronic) or permanent injury, pain, and disability. If there is any problem keeping the appointment, you must call to reschedule.    SEEK MEDICAL CARE IF:   You are not recovering in the time described by your caregiver.   You have persistent pain, weakness, or feel sick to your stomach (nauseous).   You have recovered and then have another bout of pain.  SEEK IMMEDIATE MEDICAL CARE IF:   You are unable to eat or keep fluids down.   Your pain  increases a lot or changes.   You have an oral temperature above 102 F (38.9 C), not controlled by medicine.   Your skin or the white part of your eyes look yellow (jaundice).   You develop vomiting.   You feel dizzy or faint.   Your blood sugar is high (over 300).  MAKE SURE YOU:   Understand these instructions.   Will watch your condition.   Will get help right away if you are not doing well or get worse.  Document Released: 04/06/2005 Document Revised: 03/26/2011 Document Reviewed: 11/18/2007 [facility] Patient Information 2012 [place], [place].Chronic Alcoholism Alcoholism is an addiction to alcohol. Addiction is a medical illness. It is not an one-time incident of heavy drinking that defines the disease of alcoholism.  The characteristics of addictive disease, such as alcoholism, include behaviors that the person finds pleasurable, at least initially. In alcohol addiction, drinking causes chemical changes in brain activity. This can lead to frequent cravings for alcohol. Unfortunately over time, an increased amount of alcohol is needed to produce the pleasure (tolerance). As a result, the person will start to feel uncomfortable symptoms when he or she is not drinking (withdrawal). Over time, a bad cycle develops. When painful withdrawal symptoms start to appear, alcohol is needed to make the symptoms go away. During this process, the person addicted to alcohol may become so used to the effects of alcohol that the usual signs of intoxication such as slurred speech, a staggering walk, or sleepiness may no longer be shown. Many people addicted to alcohol are able to function and even complete tasks. CAUSES   Drinking heavily and frequently.   Other factors like genetics.  SYMPTOMS   Headaches.   Frequent trouble falling or staying asleep (insomnia).   Irritability.   Uncontrolled shaking or movement (tremors).   Forgetting events (brownouts) or passing out (blackouts).    Seizures or hallucinations (delirium tremens).   Problems at work or at home that are related to drinking.   Medical problems related to drinking such as heart disease, stroke, high blood pressure, diabetes, stomach ulcers, bleeding from the GI tract, and liver failure.   Trauma (falls, broken bones, automobile crashes).  TREATMENT  Alcoholism usually gets worse over time and almost never gets better without treatment. Your caregiver can help recommend a course of treatment for you depending on how severe your symptoms are and the level of your alcohol abuse. In some patients, stopping alcohol use or even decreasing use can bring about withdrawal symptoms that are dangerous or even deadly. For this reason, hospitalization is sometimes required to medically stabilize a patient.  If hospitalization is not required, but the risk of withdrawal is high, a detoxification (detox) facility may be recommended as an initial treatment step. In a detox center, medications can be given to protect against seizures and other withdrawal symptoms. Rehabilitation treatment may also be necessary. This is the process of treating the psychological and lifestyle element of addiction. There is both inpatient and outpatient rehabilitation treatment. Inpatient programs help patients through a systematic plan of psychological questioning, both individually and in groups, and at times  using a "twelve-step" format. Outpatient rehabilitation programs have a similar structure and are aimed at promoting continued sobriety and preventing relapse into addiction. Make treatment decisions together with your caregiver. HOME CARE INSTRUCTIONS   If you are concerned about your alcohol use, talk to someone who can help. Trying to quit on your own is not easy. It can even be medically dangerous. This is especially true if you have been drinking heavily for a long time.   Call your caregiver, Alcoholics Anonymous, or other alcoholic  treatment programs for help.   AL-ANON and ALA-TEEN are support groups for friends and family members of an alcohol or drug dependent person. These people also often need help too. For information about these organizations, check your phone directory or the internet. You can also call a local alcohol or chemical dependency treatment center.  Document Released: 05/14/2004 Document Revised: 03/26/2011 Document Reviewed: 09/20/2009 [facility] Patient Information 2012 [place], [place].

## 2011-10-05 ENCOUNTER — Encounter (HOSPITAL_COMMUNITY): Payer: Self-pay | Admitting: Emergency Medicine

## 2011-10-05 ENCOUNTER — Inpatient Hospital Stay (HOSPITAL_COMMUNITY): Payer: Self-pay

## 2011-10-05 ENCOUNTER — Inpatient Hospital Stay (HOSPITAL_COMMUNITY)
Admission: EM | Admit: 2011-10-05 | Discharge: 2011-10-14 | DRG: 439 | Disposition: A | Discharge status: Home / Self Care | Payer: Self-pay | Attending: Internal Medicine | Admitting: Internal Medicine

## 2011-10-05 DIAGNOSIS — F1010 Alcohol abuse, uncomplicated: Secondary | ICD-10-CM

## 2011-10-05 DIAGNOSIS — K760 Fatty (change of) liver, not elsewhere classified: Secondary | ICD-10-CM | POA: Diagnosis present

## 2011-10-05 DIAGNOSIS — K700 Alcoholic fatty liver: Secondary | ICD-10-CM | POA: Diagnosis present

## 2011-10-05 DIAGNOSIS — K863 Pseudocyst of pancreas: Secondary | ICD-10-CM | POA: Diagnosis present

## 2011-10-05 DIAGNOSIS — K8050 Calculus of bile duct without cholangitis or cholecystitis without obstruction: Secondary | ICD-10-CM | POA: Diagnosis present

## 2011-10-05 DIAGNOSIS — K5909 Other constipation: Secondary | ICD-10-CM | POA: Diagnosis present

## 2011-10-05 DIAGNOSIS — T4275XA Adverse effect of unspecified antiepileptic and sedative-hypnotic drugs, initial encounter: Secondary | ICD-10-CM | POA: Diagnosis present

## 2011-10-05 DIAGNOSIS — E871 Hypo-osmolality and hyponatremia: Secondary | ICD-10-CM | POA: Diagnosis present

## 2011-10-05 DIAGNOSIS — K859 Acute pancreatitis without necrosis or infection, unspecified: Principal | ICD-10-CM | POA: Diagnosis present

## 2011-10-05 DIAGNOSIS — K838 Other specified diseases of biliary tract: Secondary | ICD-10-CM | POA: Diagnosis present

## 2011-10-05 DIAGNOSIS — Z9089 Acquired absence of other organs: Secondary | ICD-10-CM

## 2011-10-05 DIAGNOSIS — N390 Urinary tract infection, site not specified: Secondary | ICD-10-CM

## 2011-10-05 DIAGNOSIS — R109 Unspecified abdominal pain: Secondary | ICD-10-CM

## 2011-10-05 DIAGNOSIS — F329 Major depressive disorder, single episode, unspecified: Secondary | ICD-10-CM | POA: Diagnosis present

## 2011-10-05 DIAGNOSIS — K862 Cyst of pancreas: Secondary | ICD-10-CM | POA: Diagnosis present

## 2011-10-05 DIAGNOSIS — K449 Diaphragmatic hernia without obstruction or gangrene: Secondary | ICD-10-CM | POA: Diagnosis present

## 2011-10-05 DIAGNOSIS — R740 Nonspecific elevation of levels of transaminase and lactic acid dehydrogenase [LDH]: Secondary | ICD-10-CM

## 2011-10-05 DIAGNOSIS — K219 Gastro-esophageal reflux disease without esophagitis: Secondary | ICD-10-CM | POA: Diagnosis present

## 2011-10-05 DIAGNOSIS — F1020 Alcohol dependence, uncomplicated: Secondary | ICD-10-CM | POA: Diagnosis present

## 2011-10-05 DIAGNOSIS — Z87891 Personal history of nicotine dependence: Secondary | ICD-10-CM

## 2011-10-05 DIAGNOSIS — K861 Other chronic pancreatitis: Secondary | ICD-10-CM | POA: Diagnosis present

## 2011-10-05 DIAGNOSIS — R112 Nausea with vomiting, unspecified: Secondary | ICD-10-CM

## 2011-10-05 DIAGNOSIS — R945 Abnormal results of liver function studies: Secondary | ICD-10-CM | POA: Diagnosis present

## 2011-10-05 DIAGNOSIS — D62 Acute posthemorrhagic anemia: Secondary | ICD-10-CM | POA: Diagnosis present

## 2011-10-05 DIAGNOSIS — Z881 Allergy status to other antibiotic agents status: Secondary | ICD-10-CM

## 2011-10-05 LAB — URINALYSIS, ROUTINE W REFLEX MICROSCOPIC
Bilirubin Urine: NEGATIVE
Hgb urine dipstick: NEGATIVE
Ketones, ur: NEGATIVE mg/dL
Nitrite: NEGATIVE
Protein, ur: NEGATIVE mg/dL
Specific Gravity, Urine: 1.015 (ref 1.005–1.030)
Urobilinogen, UA: 0.2 mg/dL (ref 0.0–1.0)

## 2011-10-05 LAB — URINE MICROSCOPIC-ADD ON

## 2011-10-05 LAB — COMPREHENSIVE METABOLIC PANEL
Albumin: 3.6 g/dL (ref 3.5–5.2)
BUN: 11 mg/dL (ref 6–23)
Chloride: 95 mEq/L — ABNORMAL LOW (ref 96–112)
Creatinine, Ser: 0.54 mg/dL (ref 0.50–1.10)
GFR calc Af Amer: >90 mL/min (ref >90)
GFR calc non Af Amer: >90 mL/min (ref >90)
Glucose, Bld: 133 mg/dL — ABNORMAL HIGH (ref 70–99)
Total Bilirubin: 3.7 mg/dL — ABNORMAL HIGH (ref 0.3–1.2)

## 2011-10-05 LAB — CBC
HCT: 41.9 % (ref 36.0–46.0)
Hemoglobin: 14.2 g/dL (ref 12.0–15.0)
MCH: 31.3 pg (ref 26.0–34.0)
MCHC: 33.9 g/dL (ref 30.0–36.0)

## 2011-10-05 LAB — DIFFERENTIAL
Basophils Relative: 0 % (ref 0–1)
Lymphs Abs: 1.6 10*3/uL (ref 0.7–4.0)
Monocytes Absolute: 0.8 10*3/uL (ref 0.1–1.0)
Monocytes Relative: 6 % (ref 3–12)
Neutro Abs: 11.5 10*3/uL — ABNORMAL HIGH (ref 1.7–7.7)

## 2011-10-05 LAB — LIPASE, BLOOD: Lipase: 270 U/L — ABNORMAL HIGH (ref 11–59)

## 2011-10-05 LAB — ETHANOL: Alcohol, Ethyl (B): <11 mg/dL (ref 0–11)

## 2011-10-05 LAB — AMYLASE: Amylase: 226 U/L — ABNORMAL HIGH (ref 0–105)

## 2011-10-05 MED ORDER — PANTOPRAZOLE SODIUM 40 MG PO TBEC: 40.0000 mg | DELAYED_RELEASE_TABLET | Freq: Two times a day (BID) | ORAL | Status: DC

## 2011-10-05 MED ORDER — PROMETHAZINE HCL 25 MG/ML IJ SOLN
12.5000 mg | Freq: Once | INTRAMUSCULAR | Status: AC
  Administered 2011-10-05: 12.5 mg via INTRAVENOUS
  Filled 2011-10-05: qty 1

## 2011-10-05 MED ORDER — ONDANSETRON HCL 4 MG/2ML IJ SOLN
4.0000 mg | Freq: Once | INTRAMUSCULAR | Status: AC
  Administered 2011-10-05: 4 mg via INTRAVENOUS
  Filled 2011-10-05: qty 2

## 2011-10-05 MED ORDER — DEXTROSE 5 % IV SOLN
1.0000 g | Freq: Once | INTRAVENOUS | Status: AC
  Administered 2011-10-05: 1 g via INTRAVENOUS
  Filled 2011-10-05: qty 10

## 2011-10-05 MED ORDER — GADOBENATE DIMEGLUMINE 529 MG/ML IV SOLN
14.0000 mL | Freq: Once | INTRAVENOUS | Status: AC | PRN
  Administered 2011-10-05: 14 mL via INTRAVENOUS

## 2011-10-05 MED ORDER — ONDANSETRON HCL 4 MG PO TABS
4.0000 mg | ORAL_TABLET | Freq: Four times a day (QID) | ORAL | Status: DC | PRN
  Administered 2011-10-05 – 2011-10-07 (×5): 4 mg via ORAL
  Filled 2011-10-05 (×5): qty 1

## 2011-10-05 MED ORDER — SODIUM CHLORIDE 0.9 % IV SOLN
INTRAVENOUS | Status: AC
  Administered 2011-10-05: 08:00:00 via INTRAVENOUS
  Administered 2011-10-05: 200 mL via INTRAVENOUS

## 2011-10-05 MED ORDER — METOCLOPRAMIDE HCL 10 MG PO TABS
5.0000 mg | ORAL_TABLET | Freq: Four times a day (QID) | ORAL | Status: DC | PRN
  Administered 2011-10-05 – 2011-10-07 (×5): 5 mg via ORAL
  Filled 2011-10-05 (×4): qty 1
  Filled 2011-10-05: qty 2

## 2011-10-05 MED ORDER — HYDROMORPHONE HCL PF 2 MG/ML IJ SOLN
2.0000 mg | INTRAMUSCULAR | Status: DC | PRN
  Administered 2011-10-05 – 2011-10-07 (×10): 2 mg via INTRAVENOUS
  Filled 2011-10-05 (×10): qty 1

## 2011-10-05 MED ORDER — ENOXAPARIN SODIUM 40 MG/0.4ML ~~LOC~~ SOLN
40.0000 mg | SUBCUTANEOUS | Status: DC
  Administered 2011-10-05 – 2011-10-06 (×2): 40 mg via SUBCUTANEOUS
  Filled 2011-10-05 (×3): qty 0.4

## 2011-10-05 MED ORDER — OXYCODONE HCL 5 MG PO TABS
5.0000 mg | ORAL_TABLET | ORAL | Status: DC | PRN
  Administered 2011-10-05 – 2011-10-10 (×11): 5 mg via ORAL
  Filled 2011-10-05 (×11): qty 1

## 2011-10-05 MED ORDER — PANTOPRAZOLE SODIUM 40 MG IV SOLR
40.0000 mg | INTRAVENOUS | Status: DC
  Administered 2011-10-05 – 2011-10-08 (×4): 40 mg via INTRAVENOUS
  Filled 2011-10-05 (×5): qty 40

## 2011-10-05 MED ORDER — FENTANYL CITRATE 0.05 MG/ML IJ SOLN
50.0000 ug | Freq: Once | INTRAMUSCULAR | Status: AC
Start: 2011-10-05 — End: 2011-10-05
  Administered 2011-10-05: 50 ug via INTRAVENOUS
  Filled 2011-10-05: qty 2

## 2011-10-05 MED ORDER — ONDANSETRON HCL 4 MG/2ML IJ SOLN
4.0000 mg | Freq: Four times a day (QID) | INTRAMUSCULAR | Status: DC | PRN
  Filled 2011-10-05: qty 2

## 2011-10-05 MED ORDER — FOLIC ACID 1 MG PO TABS
1.0000 mg | ORAL_TABLET | Freq: Every day | ORAL | Status: DC
  Filled 2011-10-05: qty 1

## 2011-10-05 MED ORDER — HYDROMORPHONE HCL PF 1 MG/ML IJ SOLN
1.0000 mg | INTRAMUSCULAR | Status: DC | PRN
  Administered 2011-10-05 (×2): 1 mg via INTRAVENOUS
  Filled 2011-10-05 (×2): qty 1

## 2011-10-05 MED ORDER — ONDANSETRON HCL 4 MG/2ML IJ SOLN
4.0000 mg | Freq: Three times a day (TID) | INTRAMUSCULAR | Status: DC | PRN
  Administered 2011-10-05: 4 mg via INTRAVENOUS
  Filled 2011-10-05: qty 2

## 2011-10-05 MED ORDER — HYDROMORPHONE HCL PF 1 MG/ML IJ SOLN
1.0000 mg | Freq: Once | INTRAMUSCULAR | Status: AC
  Administered 2011-10-05: 1 mg via INTRAVENOUS
  Filled 2011-10-05: qty 1

## 2011-10-05 MED ORDER — LORAZEPAM 1 MG PO TABS
1.0000 mg | ORAL_TABLET | Freq: Two times a day (BID) | ORAL | Status: DC | PRN
  Administered 2011-10-05 – 2011-10-10 (×5): 1 mg via ORAL
  Filled 2011-10-05 (×8): qty 1

## 2011-10-05 MED ORDER — HYDROMORPHONE HCL PF 1 MG/ML IJ SOLN: 1.0000 mg | INTRAMUSCULAR | Status: DC | PRN

## 2011-10-05 MED ORDER — SODIUM CHLORIDE 0.9 % IV BOLUS (SEPSIS)
1000.0000 mL | Freq: Once | INTRAVENOUS | Status: AC
  Administered 2011-10-05: 1000 mL via INTRAVENOUS

## 2011-10-05 MED ORDER — VITAMIN B-1 100 MG PO TABS
100.0000 mg | ORAL_TABLET | Freq: Every day | ORAL | Status: DC
  Filled 2011-10-05: qty 1

## 2011-10-05 NOTE — ED Notes (Signed)
Per [facility]- Pt has HX of pancreatitis - started yesterday morning- pt is unable to tolerated PO intake and unable to take her pain meds.

## 2011-10-05 NOTE — H&P (Signed)
[facility] History and Physical  [name] VZC:[id number] DOB: 03-02-1956 DOA: 10/05/2011   PCP: Thayer Headings, MD   Chief Complaint: nausea , vomiting and abdominal pain.  HPI:  56 year old female with h/o pancreatitis, recently discharged from the hospital comes back with worsening abdominal pain associated with nausea and vomiting. She denies any other complaints. She denies  Diarrhea, no fever or chills, denies alcohol intake. On arrival to ED, she was found to have elevated LIPASE. She is bring admitted for recurrent acute on chronic pancreatitis.   Review of Systems:  NEGATIVE Except for HPI.   Past Medical History  Diagnosis Date  . GERD (gastroesophageal reflux disease)   . Rosacea   . Elevated liver function tests   . Wears glasses   . Chronic diarrhea   . Pancreatitis 01/2010    2011 admission was second admission, 3rd in 02/2011  . Alcohol abuse     H/o withdrawal   . PONV (postoperative nausea and vomiting)    Past Surgical History  Procedure Date  . Cholecystectomy   . Shoulder surgery [id number]  . Diagnostic mammogram 2008  . Tubal ligation   . Colonoscopy Never  . Esophagogastroduodenoscopy 03/21/2011    Procedure: ESOPHAGOGASTRODUODENOSCOPY (EGD);  Surgeon: Yancey Flemings, MD;  Location: [facility] ENDOSCOPY;  Service: Endoscopy;  Laterality: N/A;  Patient may need to be done at bedside tomorrow depending on status   Social History:  reports that she quit smoking about 33 years ago. She has never used smokeless tobacco. She reports that she drinks about 1.5 - 2 ounces of alcohol per week. She reports that she does not use illicit drugs.  Allergies  Allergen Reactions  . Ciprofloxacin     REACTION: nausea    Family History  Problem Relation Age of Onset  . Stroke Mother   . Heart disease Father   . Heart disease Sister     Prior to Admission medications   Medication Sig Start Date End Date Taking? Authorizing Provider  folic acid (FOLVITE) 1  MG tablet Take 1 tablet (1 mg total) by mouth daily. 09/29/11 09/28/12 Yes Nishant Dhungel, MD  HYDROmorphone (DILAUDID) 2 MG tablet Take 2 mg by mouth 3 (three) times daily.   Yes Historical Provider, MD  LORazepam (ATIVAN) 1 MG tablet Take 1 mg by mouth 2 (two) times daily as needed. Anxiety   Yes Historical Provider, MD  metoCLOPramide (REGLAN) 5 MG tablet Take 1 tablet (5 mg total) by mouth every 6 (six) hours as needed (nausea). 09/29/11 10/09/11 Yes Nishant Dhungel, MD  oxyCODONE (OXY IR/ROXICODONE) 5 MG immediate release tablet Take 1 tablet (5 mg total) by mouth every 4 (four) hours as needed for pain. 09/29/11 10/09/11 Yes Nishant Dhungel, MD  pantoprazole (PROTONIX) 40 MG tablet Take 1 tablet (40 mg total) by mouth 2 (two) times daily before a meal. 09/29/11 09/28/12 Yes Nishant Dhungel, MD  senna-docusate (SENOKOT-S) 8.6-50 MG per tablet Take 1 tablet by mouth daily. 09/29/11 09/28/12 Yes Nishant Dhungel, MD  thiamine 100 MG tablet Take 1 tablet (100 mg total) by mouth daily. 09/29/11 09/28/12 Yes Nishant Dhungel, MD   Physical Exam: Filed Vitals:   10/05/11 0500 10/05/11 0530 10/05/11 0701 10/05/11 0801  BP: 149/98 150/68 146/81 126/88  Pulse: 66 62 98 68  Temp:   98.3 F (36.8 C) 98.3 F (36.8 C)  TempSrc:   Oral Oral  Resp:    16  Height:    5\' 7"  (1.702 m)  Weight:  69.718 kg (153 lb 11.2 oz)  SpO2: 98% 100% 100% 93%    Constitutional: Vital signs reviewed.  Patient is a well-developed and well-nourished  in no acute distress and cooperative with exam. Alert and oriented x3.  Head: Normocephalic and atraumatic Ear: TM normal bilaterally Mouth: no erythema or exudates, MMM Eyes: PERRL, EOMI, conjunctivae normal, No scleral icterus.  Neck: Supple, Trachea midline normal ROM, No JVD, mass, thyromegaly, or carotid bruit present.  Cardiovascular: RRR, S1 normal, S2 normal, no MRG, pulses symmetric and intact bilaterally Pulmonary/Chest: CTAB, no wheezes, rales, or rhonchi Abdominal:  Soft.tender in the epigatric area.  non-distended, bowel sounds are normal, no masses, organomegaly, or guarding present.  GU: no CVA tenderness Musculoskeletal: No joint deformities, erythema, or stiffness, ROM full and no nontender Hematology: no cervical, inginal, or axillary adenopathy.  Neurological: A&O x3, Strenght is normal and symmetric bilaterally, cranial nerve II-XII are grossly intact, no focal motor deficit, sensory intact to light touch bilaterally.  Skin: Warm, dry and intact. No rash, cyanosis, or clubbing.  Psychiatric: Normal mood and affect.  Labs on Admission:  Basic Metabolic Panel:  Lab 10/05/11 1610  NA 135  K 3.7  CL 95*  CO2 25  GLUCOSE 133*  BUN 11  CREATININE 0.54  CALCIUM 9.9  MG --  PHOS --   Liver Function Tests:  Lab 10/05/11 0305  AST 157*  ALT 81*  ALKPHOS 228*  BILITOT 3.7*  PROT 7.9  ALBUMIN 3.6    Lab 10/05/11 0305  LIPASE 270*  AMYLASE 226*   No results found for this basename: AMMONIA:5 in the last 168 hours CBC:  Lab 10/05/11 0305  WBC 14.0*  NEUTROABS 11.5*  HGB 14.2  HCT 41.9  MCV 92.3  PLT 485*   Cardiac Enzymes: No results found for this basename: CKTOTAL:5,CKMB:5,CKMBINDEX:5,TROPONINI:5 in the last 168 hours BNP: No components found with this basename: POCBNP:5 CBG: No results found for this basename: GLUCAP:5 in the last 168 hours  Radiological Exams on Admission: No results found.   Assessment/Plan 1. Acute on chronic Pancreatitis: NPO, IV fluids with normal saline, pain medications, IV anti emetics, US abdomen and GI consutl called. Plan for MRCP in am. Will repeat lipase levels in am.  2. Elevated liver function tests: MRCP ordered to evaluate for any biliary stones and dilatation of CBD on previous CT ABD AND PLEVIS.  3. DVT Prophylaxis: sq lovenox.   Code Status: full code  Disposition Plan: when medically stable.   Kathlen Mody, MD  [facility] Pager [phone]  If 7PM-7AM,  please contact night-coverage [email or web address] Password [name] 10/05/2011, 9:25 AM

## 2011-10-05 NOTE — ED Provider Notes (Signed)
History     CSN: [id number]  Arrival date & time 10/05/11  7829   First MD Initiated Contact with Patient 10/05/11 0425      Chief Complaint  Patient presents with  . Abdominal Pain  . Nausea  . Emesis  . Pancreatitis    (Consider location/radiation/quality/duration/timing/severity/associated sxs/prior treatment) HPI 56 year old female presents to emergency room complaining of abdominal pain, nausea and vomiting. Patient feels that her pancreatitis is flaring again. Patient patient recently admitted from the fifth to the 11th of this month. Patient felt slightly better when she was discharged, but reports Saturday morning, 2 days ago she had increasing symptoms. She's been unable to keep down any of her medications. Patient denies any alcohol use since being discharged from the hospital. She has had no fevers. Pain is in her upper abdomen radiating into her back. She has had normal bowel movements. Past Medical History  Diagnosis Date  . GERD (gastroesophageal reflux disease)   . Rosacea   . Elevated liver function tests   . Wears glasses   . Chronic diarrhea   . Pancreatitis 01/2010    2011 admission was second admission, 3rd in 02/2011  . Alcohol abuse     H/o withdrawal   . PONV (postoperative nausea and vomiting)     Past Surgical History  Procedure Date  . Cholecystectomy   . Shoulder surgery [id number]  . Diagnostic mammogram 2008  . Tubal ligation   . Colonoscopy Never  . Esophagogastroduodenoscopy 03/21/2011    Procedure: ESOPHAGOGASTRODUODENOSCOPY (EGD);  Surgeon: Yancey Flemings, MD;  Location: [facility] ENDOSCOPY;  Service: Endoscopy;  Laterality: N/A;  Patient may need to be done at bedside tomorrow depending on status    Family History  Problem Relation Age of Onset  . Stroke Mother   . Heart disease Father   . Heart disease Sister     History  Substance Use Topics  . Smoking status: Former Smoker -- 5 years    Quit date: 09/22/1978  . Smokeless tobacco: Never  Used  . Alcohol Use: 1.5 - 2.0 oz/week    3-4 drink(s) per week     gets dt's - can make it up to 1 week at times    OB History    Grav Para Term Preterm Abortions TAB SAB Ect Mult Living                  Review of Systems  All other systems reviewed and are negative.    Allergies  Ciprofloxacin  Home Medications   Current Outpatient Rx  Name Route Sig Dispense Refill  . FOLIC ACID 1 MG PO TABS Oral Take 1 tablet (1 mg total) by mouth daily. 30 tablet 0  . HYDROMORPHONE HCL 2 MG PO TABS Oral Take 2 mg by mouth 3 (three) times daily.    Marland Kitchen LORAZEPAM 1 MG PO TABS Oral Take 1 mg by mouth 2 (two) times daily as needed. Anxiety    . METOCLOPRAMIDE HCL 5 MG PO TABS Oral Take 1 tablet (5 mg total) by mouth every 6 (six) hours as needed (nausea). 20 tablet 0  . OXYCODONE HCL 5 MG PO TABS Oral Take 1 tablet (5 mg total) by mouth every 4 (four) hours as needed for pain. 30 tablet 0  . PANTOPRAZOLE SODIUM 40 MG PO TBEC Oral Take 1 tablet (40 mg total) by mouth 2 (two) times daily before a meal. 30 tablet 2  . SENNOSIDES-DOCUSATE SODIUM 8.6-50 MG PO TABS Oral Take  1 tablet by mouth daily. 10 tablet 0  . THIAMINE HCL 100 MG PO TABS Oral Take 1 tablet (100 mg total) by mouth daily. 30 tablet 0    BP 146/81  Pulse 98  Temp 98.3 F (36.8 C) (Oral)  Resp 16  SpO2 100%  Physical Exam  Nursing note and vitals reviewed. Constitutional: She is oriented to person, place, and time. She appears well-developed and well-nourished.  HENT:  Head: Normocephalic and atraumatic.  Nose: Nose normal.  Mouth/Throat: Oropharynx is clear and moist.  Eyes: Conjunctivae and EOM are normal. Pupils are equal, round, and reactive to light.  Neck: Normal range of motion. Neck supple. No JVD present. No tracheal deviation present. No thyromegaly present.  Cardiovascular: Normal rate, regular rhythm, normal heart sounds and intact distal pulses.  Exam reveals no gallop and no friction rub.   No murmur  heard. Pulmonary/Chest: Effort normal and breath sounds normal. No stridor. No respiratory distress. She has no wheezes. She has no rales. She exhibits no tenderness.  Abdominal: Soft. Bowel sounds are normal. She exhibits no distension and no mass. There is tenderness (tenderness across upper abdomen). There is no rebound and no guarding.  Musculoskeletal: Normal range of motion. She exhibits no edema and no tenderness.  Lymphadenopathy:    She has no cervical adenopathy.  Neurological: She is oriented to person, place, and time. She exhibits normal muscle tone. Coordination normal.  Skin: Skin is dry. No rash noted. No erythema. No pallor.  Psychiatric: She has a normal mood and affect. Her behavior is normal. Judgment and thought content normal.    ED Course  Procedures (including critical care time)  Labs Reviewed  CBC - Abnormal; Notable for the following:    WBC 14.0 (*)     Platelets 485 (*)     All other components within normal limits  DIFFERENTIAL - Abnormal; Notable for the following:    Neutrophils Relative 82 (*)     Neutro Abs 11.5 (*)     Lymphocytes Relative 11 (*)     All other components within normal limits  COMPREHENSIVE METABOLIC PANEL - Abnormal; Notable for the following:    Chloride 95 (*)     Glucose, Bld 133 (*)     AST 157 (*)     ALT 81 (*)     Alkaline Phosphatase 228 (*)     Total Bilirubin 3.7 (*)     All other components within normal limits  AMYLASE - Abnormal; Notable for the following:    Amylase 226 (*)     All other components within normal limits  LIPASE, BLOOD - Abnormal; Notable for the following:    Lipase 270 (*)     All other components within normal limits  URINALYSIS, ROUTINE W REFLEX MICROSCOPIC - Abnormal; Notable for the following:    Color, Urine AMBER (*)  BIOCHEMICALS MAY BE AFFECTED BY COLOR   APPearance CLOUDY (*)     Leukocytes, UA LARGE (*)     All other components within normal limits  URINE MICROSCOPIC-ADD ON -  Abnormal; Notable for the following:    Squamous Epithelial / LPF MANY (*)     Bacteria, UA MANY (*)     All other components within normal limits  ETHANOL  URINE CULTURE   No results found.   1. Pancreatitis   2. Urinary tract infection   3. Bilirubinemia   4. Transaminitis   5. Nausea and vomiting   6. Abdominal pain, acute  MDM  56 year old female with recurrent pancreatitis. We'll admit back to the hospital.        Olivia Mackie, MD 10/05/11 [phone]

## 2011-10-05 NOTE — ED Notes (Signed)
Patient informed of delay of admission

## 2011-10-05 NOTE — ED Notes (Signed)
[id number]<AV> Expected date:10/05/11<BR> Expected time: 2:33 AM<BR> Means of arrival:Ambulance<BR> Comments:<BR> Abdominal pain, hx of pancreatitis.

## 2011-10-05 NOTE — ED Notes (Signed)
Called report to nurse Victorino Dike and she stated that she was unable to take patient due to fact patient only had temporal admission orders. Fleet Contras, CN notified.

## 2011-10-06 ENCOUNTER — Inpatient Hospital Stay (HOSPITAL_COMMUNITY): Payer: Self-pay

## 2011-10-06 ENCOUNTER — Encounter (HOSPITAL_COMMUNITY)
Admission: EM | Disposition: A | Discharge status: Home / Self Care | Payer: Self-pay | Source: Home / Self Care | Attending: Internal Medicine

## 2011-10-06 ENCOUNTER — Encounter (HOSPITAL_COMMUNITY): Payer: Self-pay | Admitting: Gastroenterology

## 2011-10-06 DIAGNOSIS — K859 Acute pancreatitis without necrosis or infection, unspecified: Secondary | ICD-10-CM

## 2011-10-06 DIAGNOSIS — R740 Nonspecific elevation of levels of transaminase and lactic acid dehydrogenase [LDH]: Secondary | ICD-10-CM

## 2011-10-06 DIAGNOSIS — F1010 Alcohol abuse, uncomplicated: Secondary | ICD-10-CM

## 2011-10-06 HISTORY — PX: ERCP: SHX5425

## 2011-10-06 LAB — CBC
HCT: 36.6 % (ref 36.0–46.0)
MCH: 30.5 pg (ref 26.0–34.0)
MCHC: 31.7 g/dL (ref 30.0–36.0)
MCV: 96.3 fL (ref 78.0–100.0)
RDW: 13.9 % (ref 11.5–15.5)

## 2011-10-06 LAB — COMPREHENSIVE METABOLIC PANEL
ALT: 85 U/L — ABNORMAL HIGH (ref 0–35)
AST: 144 U/L — ABNORMAL HIGH (ref 0–37)
Alkaline Phosphatase: 201 U/L — ABNORMAL HIGH (ref 39–117)
CO2: 25 mEq/L (ref 19–32)
GFR calc Af Amer: >90 mL/min (ref >90)
Glucose, Bld: 83 mg/dL (ref 70–99)
Potassium: 3.8 mEq/L (ref 3.5–5.1)
Sodium: 136 mEq/L (ref 135–145)
Total Protein: 6.4 g/dL (ref 6.0–8.3)

## 2011-10-06 LAB — URINE CULTURE: Colony Count: 60000

## 2011-10-06 LAB — PROTIME-INR: INR: 0.97 (ref 0.00–1.49)

## 2011-10-06 SURGERY — ERCP, WITH INTERVENTION IF INDICATED
Anesthesia: Moderate Sedation

## 2011-10-06 MED ORDER — BIOTENE DRY MOUTH MT LIQD: 15.0000 mL | Freq: Two times a day (BID) | OROMUCOSAL | Status: DC

## 2011-10-06 MED ORDER — FENTANYL CITRATE 0.05 MG/ML IJ SOLN
INTRAMUSCULAR | Status: AC
  Filled 2011-10-06: qty 4

## 2011-10-06 MED ORDER — SODIUM CHLORIDE 0.9 % IV SOLN
INTRAVENOUS | Status: AC
  Filled 2011-10-06: qty 1.5

## 2011-10-06 MED ORDER — SODIUM CHLORIDE 0.9 % IV SOLN
500.0000 mg | Freq: Three times a day (TID) | INTRAVENOUS | Status: DC
  Administered 2011-10-06 – 2011-10-09 (×8): 500 mg via INTRAVENOUS
  Filled 2011-10-06 (×10): qty 500

## 2011-10-06 MED ORDER — CHLORHEXIDINE GLUCONATE 0.12 % MT SOLN
15.0000 mL | Freq: Two times a day (BID) | OROMUCOSAL | Status: DC
  Filled 2011-10-06 (×3): qty 15

## 2011-10-06 MED ORDER — SODIUM CHLORIDE 0.9 % IV SOLN
INTRAVENOUS | Status: DC | PRN
  Administered 2011-10-06: 17:00:00

## 2011-10-06 MED ORDER — BUTAMBEN-TETRACAINE-BENZOCAINE 2-2-14 % EX AERO
INHALATION_SPRAY | CUTANEOUS | Status: DC | PRN
  Administered 2011-10-06: 2 via TOPICAL

## 2011-10-06 MED ORDER — MIDAZOLAM HCL 10 MG/2ML IJ SOLN
INTRAMUSCULAR | Status: DC | PRN
  Administered 2011-10-06: 1 mg via INTRAVENOUS
  Administered 2011-10-06 (×2): 2 mg via INTRAVENOUS
  Administered 2011-10-06 (×3): 1 mg via INTRAVENOUS
  Administered 2011-10-06: 2 mg via INTRAVENOUS

## 2011-10-06 MED ORDER — DIPHENHYDRAMINE HCL 50 MG/ML IJ SOLN
INTRAMUSCULAR | Status: AC
  Filled 2011-10-06: qty 1

## 2011-10-06 MED ORDER — DIPHENHYDRAMINE HCL 50 MG/ML IJ SOLN
INTRAMUSCULAR | Status: DC | PRN
  Administered 2011-10-06 (×2): 12.5 mg via INTRAVENOUS
  Administered 2011-10-06: 25 mg via INTRAVENOUS

## 2011-10-06 MED ORDER — SODIUM CHLORIDE 0.9 % IV SOLN
1.5000 g | Freq: Once | INTRAVENOUS | Status: AC
  Administered 2011-10-06: 1.5 g via INTRAVENOUS

## 2011-10-06 MED ORDER — LORAZEPAM 0.5 MG PO TABS
0.5000 mg | ORAL_TABLET | Freq: Once | ORAL | Status: AC
  Administered 2011-10-06: 0.5 mg via ORAL
  Filled 2011-10-06: qty 1

## 2011-10-06 MED ORDER — ONDANSETRON HCL 4 MG/2ML IJ SOLN
INTRAMUSCULAR | Status: AC
  Filled 2011-10-06: qty 2

## 2011-10-06 MED ORDER — BIOTENE DRY MOUTH MT LIQD
15.0000 mL | Freq: Two times a day (BID) | OROMUCOSAL | Status: DC
  Administered 2011-10-06 – 2011-10-14 (×14): 15 mL via OROMUCOSAL

## 2011-10-06 MED ORDER — MIDAZOLAM HCL 10 MG/2ML IJ SOLN
INTRAMUSCULAR | Status: AC
  Filled 2011-10-06: qty 4

## 2011-10-06 MED ORDER — FENTANYL CITRATE 0.05 MG/ML IJ SOLN
INTRAMUSCULAR | Status: DC | PRN
  Administered 2011-10-06 (×2): 12.5 ug via INTRAVENOUS
  Administered 2011-10-06 (×2): 25 ug via INTRAVENOUS

## 2011-10-06 MED ORDER — GLUCAGON HCL (RDNA) 1 MG IJ SOLR
INTRAMUSCULAR | Status: AC
  Filled 2011-10-06: qty 2

## 2011-10-06 MED ORDER — ONDANSETRON HCL 4 MG/2ML IJ SOLN
INTRAMUSCULAR | Status: DC | PRN
  Administered 2011-10-06: 4 mg via INTRAVENOUS

## 2011-10-06 NOTE — Progress Notes (Signed)
Patient back to rm 1320 , alert and awake,no bleeding noted.

## 2011-10-06 NOTE — Op Note (Signed)
[facility] [address] [place], [place]  [address]  ERCP PROCEDURE REPORT  PATIENT:  [name], [name]  MR#:  [id number] BIRTHDATE:  [birth year]  GENDER:  female  ENDOSCOPIST:  Jeani Hawking, MD ASSISTANT:  Tyrone Nine, RN CGRN  PROCEDURE DATE:  10/06/2011 PROCEDURE:  ERCP with removal of stones ASA CLASS:  Class III  INDICATIONS:  choledocholithiasis  MEDICATIONS:   Fentanyl 75 mcg IV, Versed 10 mg IV, Zofran 4 mg IV, Benadryl 50 mg IV TOPICAL ANESTHETIC:  Cetacaine Spray  DESCRIPTION OF PROCEDURE:   After the risks benefits and alternatives of the procedure were thoroughly explained, informed consent was obtained.  The Pentax ERCP [id number] endoscope was introduced through the mouth and advanced to the second portion of the duodenum.  The ampulla was noted to be large and floppy.  Some blood was noted to becoming from the ampulla, prior to any manipulation. The CBD was able to be cannulated during the first pass. The Al Pimple was secured in the right intrahepatic ducts. Contrast injection revealed a dilated CBD at 1.5 cm proximally and then tapering to 1 cm distally. Multiple filling defects were identified in the proximal CBD. A large 2 cm sphincterotomy was created and no bleeding occurred. The initial balloon sweep revealed clots, which were the filling defects. A small stone was identified. There was no significant extrinsic compression. The CBD was cleared of all clots and an occlusion cholangiogram was negative for any filling defects. Stent placment was not felt to be necesssary at this time.  Interestingly, constrast was noted to extravasate around the distal CBD and there was PD filling. Most likely there is a communication between the CBD and the pseudocyst, which is the source of the bleeding. The pseudocyst is hemorrhagic.    The scope was then completely withdrawn from the patient and the procedure  terminated. <<PROCEDUREIMAGES>>  COMPLICATIONS:  None ENDOSCOPIC IMPRESSION: 1) Bd/pc cann. successful RECOMMENDATIONS: 1) Start Primaxin as there was contrast injection into the pseudocyst. 2) Follow HGB and transfuse as necessary. 3) NPO.  She may end up requiring TPN. 4) Follow liver panel. 5) No anticoagulation.  ______________________________ Jeani Hawking, MD  n. Rosalie DoctorJeani Hawking at 10/06/2011 05:27 PM  Beverley Fiedler, [phone]

## 2011-10-06 NOTE — Consult Note (Signed)
Reason for Consult: Jaundice, ETOH pancreatitis, and pseudocyst Referring Physician: [facility].  [name] HPI: This is a 56 year old unassigned female admitted for pancreatitis.  She was just discharged for her ETOH pancreatitis and at that time the CT scan revealed a pseudocyst in the head of the pancreas.  Her blood work from the hepatic standpoint during this most recent admission was mildly abnormal, but currently her TB increased up into the 5 range from a 1-2 range.  Her AST/ALT were higher recently, but they did drop down.  She complained of having worsening abdominal pain and further evaluation with an MRCP revealed a dilated CBD with biliary sludge.  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  As a result of her symptoms and findings a GI consultation was requested.  Of note she was discharged from the [facility].  Past Medical History  Diagnosis Date  . GERD (gastroesophageal reflux disease)   . Rosacea   . Elevated liver function tests   . Wears glasses   . Chronic diarrhea   . Pancreatitis 01/2010    2011 admission was second admission, 3rd in 02/2011  . Alcohol abuse     H/o withdrawal   . PONV (postoperative nausea and vomiting)     Past Surgical History  Procedure Date  . Cholecystectomy   . Shoulder surgery [id number]  . Diagnostic mammogram 2008  . Tubal ligation   . Colonoscopy Never  . Esophagogastroduodenoscopy 03/21/2011    Procedure: ESOPHAGOGASTRODUODENOSCOPY (EGD);  Surgeon: Yancey Flemings, MD;  Location: [facility] ENDOSCOPY;  Service: Endoscopy;  Laterality: N/A;  Patient may need to be done at bedside tomorrow depending on status    Family History  Problem Relation Age of Onset  . Stroke Mother   . Heart disease Father   . Heart failure Father   . Heart disease Sister     Social History:  reports that she quit smoking about 33 years ago. She has never used smokeless tobacco.  She reports that she drinks about 1.5 - 2 ounces of alcohol per week. She reports that she does not use illicit drugs.  Allergies:  Allergies  Allergen Reactions  . Ciprofloxacin     REACTION: nausea    Medications:  Scheduled:   . sodium chloride   Intravenous STAT  . ampicillin-sulbactam (UNASYN) 1.5 g IVPB  1.5 g Intravenous Once  . antiseptic oral rinse  15 mL Mouth Rinse q12n4p  . enoxaparin  40 mg Subcutaneous Q24H  . pantoprazole (PROTONIX) IV  40 mg Intravenous Q24H  . DISCONTD: antiseptic oral rinse  15 mL Mouth Rinse BID  . DISCONTD: chlorhexidine  15 mL Mouth Rinse BID   Continuous:   Results for orders placed during the hospital encounter of 10/05/11 (from the past 24 hour(s))  LIPASE, BLOOD     Status: Normal   Collection Time   10/06/11  3:40 AM      Component Value Range   Lipase 40  11 - 59 U/L  COMPREHENSIVE METABOLIC PANEL     Status: Abnormal   Collection Time   10/06/11  3:49 AM      Component Value Range   Sodium 136  135 - 145 mEq/L   Potassium 3.8  3.5 - 5.1 mEq/L   Chloride 101  96 - 112 mEq/L   CO2 25  19 - 32 mEq/L   Glucose, Bld 83  70 -  99 mg/dL   BUN 8  6 - 23 mg/dL   Creatinine, Ser 9.60  0.50 - 1.10 mg/dL   Calcium 9.0  8.4 - 45.4 mg/dL   Total Protein 6.4  6.0 - 8.3 g/dL   Albumin 2.9 (*) 3.5 - 5.2 g/dL   AST 098 (*) 0 - 37 U/L   ALT 85 (*) 0 - 35 U/L   Alkaline Phosphatase 201 (*) 39 - 117 U/L   Total Bilirubin 5.4 (*) 0.3 - 1.2 mg/dL   GFR calc non Af Amer >90  >90 mL/min   GFR calc Af Amer >90  >90 mL/min  CBC     Status: Abnormal   Collection Time   10/06/11  3:49 AM      Component Value Range   WBC 7.0  4.0 - 10.5 K/uL   RBC 3.80 (*) 3.87 - 5.11 MIL/uL   Hemoglobin 11.6 (*) 12.0 - 15.0 g/dL   HCT 11.9  14.7 - 82.9 %   MCV 96.3  78.0 - 100.0 fL   MCH 30.5  26.0 - 34.0 pg   MCHC 31.7  30.0 - 36.0 g/dL   RDW 56.2  13.0 - 86.5 %   Platelets 335  150 - 400 K/uL  PROTIME-INR     Status: Normal   Collection Time   10/06/11  3:49  AM      Component Value Range   Prothrombin Time 13.1  11.6 - 15.2 seconds   INR 0.97  0.00 - 1.49  APTT     Status: Normal   Collection Time   10/06/11  3:49 AM      Component Value Range   aPTT 30  24 - 37 seconds     US Abdomen Complete  10/05/2011  *RADIOLOGY REPORT*  Clinical Data:  Pancreatitis.  Elevated liver enzymes.  COMPLETE ABDOMINAL ULTRASOUND  Comparison:  CT abdomen and pelvis 09/27/2011 and 09/12.  Findings:  Gallbladder:  Removed.  Common bile duct:  Measures 1.4 cm.  There appears to be some sludge within the common bile duct but no discrete stone is identified.  Liver:  Intrahepatic biliary ductal dilatation is seen as on the most recent CT scan.  No focal liver lesion.  IVC:  Appears normal.  Pancreas:  3.1 cm hypoechoic collection in the pancreatic head is consistent with the small fluid collection seen on the most recent CT scan.  Spleen:  Measures  Right Kidney:  Measures 10.6 cm and appears normal.  Left Kidney:  Measures 11.0 cm and appears normal.  Abdominal aorta:  No aneurysm identified.  IMPRESSION:  1.  Intra and extrahepatic biliary ductal dilatation.  Sludge is identified within the common bile duct.  No ductal stone is seen. 2.  Status post cholecystectomy. 3.  Low attenuation collection in the pancreatic head is seen as on the most recent CT scans compatible with a pseudocyst.  Original Report Authenticated By: Bernadene Bell. D'ALESSIO, M.D.   Mr 3d Recon At Scanner  10/06/2011  *RADIOLOGY REPORT*  Clinical Data:  Pancreatitis.  Abnormal liver function tests. Biliary ductal dilatation.  MRI ABDOMEN WITHOUT CONTRAST (MRCP)  Technique: Multiplanar multisequence MR imaging of the abdomen was performed, including heavily T2-weighted images of the biliary and pancreatic ducts.  Three-dimensional MR images were rendered by post processing of the original MR data.  Comparison:  CT of abdomen pelvis 09/27/2011.  Findings:  There are two pancreatic pseudocysts present, one in the  head of the pancreas (measuring approximately 2.8  x 1.8 x 1.6 cm), and the other in the neck (measuring approximately 1.1 x 1.4 x 1.5 cm).  The largest lesion is in the head of the pancreas demonstrates heterogeneously intermediate and high signal intensity on T1-weighted imaging, and heterogeneously high and low signal intensity on T2-weighted imaging, without significant areas of internal enhancement on post-gadolinium imaging, consistent with some internal debris.  The smaller lesion is more uniformly low signal intensity on T1, high signal intensity on T2, without significant enhancement, suggesting less internal debris. The larger of these two lesions appears to exerts some mass effect upon the distal common bile duct, and there is heterogeneous signal intensity within the distal common bile duct with high signal intensity on T2-weighted images (low on T1) in the nondependent portion of the duct and low signal intensity dependently (high on T1), indicative of layering biliary sludge.  Main pancreatic duct is only mildly dilated at 4 mm. The main pancreatic duct is well visualized in the body and tail of the pancreas, but cannot be visualized distal to the pancreatic pseudocyst in the pancreatic head.  Above the pancreatic head pseudocyst the common bile duct is dilated to 18 mm and there is severe diffuse intrahepatic biliary ductal dilatation.  However, the common bile duct tapers to a more normal caliber (5 mm) below the pancreatic pseudocyst.  Other incidental findings include:  - Multiple sub centimeter renal lesions bilaterally that are low signal intensity on T1-weighted images, high signal intensity on T2- weighted images, and do not enhance, consistent with small renal cysts. - The patient is status post cholecystectomy.  The appearance of the spleen and bilateral adrenal glands is unremarkable. - Mild narrowing at the ostium of the celiac axis again noted. - A moderate sized hiatal hernia.  IMPRESSION:  1.  Severe dilatation of intrahepatic biliary tree and common bile duct which abruptly tapers to a more normal caliber as the distal common bile duct passes adjacent to a complex pancreatic head pseudocyst.  No distal ductal stone is identified.  However, there is a large amount of biliary sludge in the distal common bile duct. 2.  The main pancreatic duct is only mildly dilated, measuring 4 mm.  However, as this duct extends into the head of the pancreas it is not visualized once it comes in proximity to the pancreatic head pseudocyst.  This could suggest significant mass effect upon the duct, or communication of the duct with this pseudocyst.  This pancreatic head pseudocyst is highly complex with internal debris (no evidence of internal enhancement to suggest neoplasm at this time). 3.  Smaller simple pseudocysts in the neck of the pancreas, as above. 4.  Additional incidental findings, as above.  Original Report Authenticated By: Florencia Reasons, M.D.   Mr Abd W/wo Cm/mrcp  10/06/2011  *RADIOLOGY REPORT*  Clinical Data:  Pancreatitis.  Abnormal liver function tests. Biliary ductal dilatation.  MRI ABDOMEN WITHOUT CONTRAST (MRCP)  Technique: Multiplanar multisequence MR imaging of the abdomen was performed, including heavily T2-weighted images of the biliary and pancreatic ducts.  Three-dimensional MR images were rendered by post processing of the original MR data.  Comparison:  CT of abdomen pelvis 09/27/2011.  Findings:  There are two pancreatic pseudocysts present, one in the head of the pancreas (measuring approximately 2.8 x 1.8 x 1.6 cm), and the other in the neck (measuring approximately 1.1 x 1.4 x 1.5 cm).  The largest lesion is in the head of the pancreas demonstrates heterogeneously intermediate and high signal intensity  on T1-weighted imaging, and heterogeneously high and low signal intensity on T2-weighted imaging, without significant areas of internal enhancement on post-gadolinium imaging,  consistent with some internal debris.  The smaller lesion is more uniformly low signal intensity on T1, high signal intensity on T2, without significant enhancement, suggesting less internal debris. The larger of these two lesions appears to exerts some mass effect upon the distal common bile duct, and there is heterogeneous signal intensity within the distal common bile duct with high signal intensity on T2-weighted images (low on T1) in the nondependent portion of the duct and low signal intensity dependently (high on T1), indicative of layering biliary sludge.  Main pancreatic duct is only mildly dilated at 4 mm. The main pancreatic duct is well visualized in the body and tail of the pancreas, but cannot be visualized distal to the pancreatic pseudocyst in the pancreatic head.  Above the pancreatic head pseudocyst the common bile duct is dilated to 18 mm and there is severe diffuse intrahepatic biliary ductal dilatation.  However, the common bile duct tapers to a more normal caliber (5 mm) below the pancreatic pseudocyst.  Other incidental findings include:  - Multiple sub centimeter renal lesions bilaterally that are low signal intensity on T1-weighted images, high signal intensity on T2- weighted images, and do not enhance, consistent with small renal cysts. - The patient is status post cholecystectomy.  The appearance of the spleen and bilateral adrenal glands is unremarkable. - Mild narrowing at the ostium of the celiac axis again noted. - A moderate sized hiatal hernia.  IMPRESSION: 1.  Severe dilatation of intrahepatic biliary tree and common bile duct which abruptly tapers to a more normal caliber as the distal common bile duct passes adjacent to a complex pancreatic head pseudocyst.  No distal ductal stone is identified.  However, there is a large amount of biliary sludge in the distal common bile duct. 2.  The main pancreatic duct is only mildly dilated, measuring 4 mm.  However, as this duct extends  into the head of the pancreas it is not visualized once it comes in proximity to the pancreatic head pseudocyst.  This could suggest significant mass effect upon the duct, or communication of the duct with this pseudocyst.  This pancreatic head pseudocyst is highly complex with internal debris (no evidence of internal enhancement to suggest neoplasm at this time). 3.  Smaller simple pseudocysts in the neck of the pancreas, as above. 4.  Additional incidental findings, as above.  Original Report Authenticated By: Florencia Reasons, M.D.    ROS:  As stated above in the HPI otherwise negative.  Blood pressure 131/87, pulse 18, temperature 98.6 F (37 C), temperature source Oral, resp. rate 18, height 5\' 7"  (1.702 m), weight 69.718 kg (153 lb 11.2 oz), SpO2 92.00%.    PE: Gen: NAD, Alert and Oriented HEENT:  [place]/AT, EOMI Neck: Supple, no LAD Lungs: CTA Bilaterally CV: RRR without M/G/R ABM: Soft, tender in the upper ABM, +BS Ext: No C/C/E  Assessment/Plan: 1) Extrinsic obstruction from the pancreatic pseudocyst. 2) Biliary sludge. 3) Alcoholism. 4) ETOH pancreatitis.  Plan: 1) ERCP today with stent placement.  Kynzley Dowson D 10/06/2011, 4:19 PM

## 2011-10-06 NOTE — Progress Notes (Signed)
[facility] PROGRESS NOTE  [name] BJY:[id number] DOB: 1955/12/02 DOA: 10/05/2011 PCP: Thayer Headings, MD  Assessment/Plan: 1. Acute on chronic Pancreatitis: NPO, IV fluids with normal saline, pain medications, IV anti emetics,GI consutl called. Plan for ERCP TODAY.repeat lipase levels normal. 2. Elevated liver function tests: MRCPabnormal. Going for ERCP today. 3. DVT Prophylaxis: sq lovenox.    Code Status: FULL [name], MD  [facility] Pager [phone]  If 7PM-7AM, please contact night-coverage [email or web address] Password [email or web address] 10/06/2011, 2:05 PM   LOS: 1 day   Brief narrative:  56 year old female with h/o pancreatitis, recently discharged from the hospital comes back with worsening abdominal pain associated with nausea and vomiting. She denies any other complaints. She denies Diarrhea, no fever or chills, denies alcohol intake. On arrival to ED, she was found to have elevated LIPASE. She is bring admitted for recurrent acute on chronic pancreatitis. She was also found to have elevated LFT's with dilatation of the intra and extrahepatic biliary duct dilatation. GI consult called.  Consultants:  GI called  Procedures:  MRCP  Antibiotics:  NONE  HPI/Subjective: Sleeping comfortably.   Objective: Filed Vitals:   10/05/11 0801 10/05/11 1433 10/05/11 2159 10/06/11 0611  BP: 126/88 120/67 130/77 128/81  Pulse: 68 65 67 72  Temp: 98.3 F (36.8 C) 98.1 F (36.7 C) 98.2 F (36.8 C) 98 F (36.7 C)  TempSrc: Oral Oral Oral Oral  Resp: 16 16 16 17   Height: 5\' 7"  (1.702 m)     Weight: 69.718 kg (153 lb 11.2 oz)     SpO2: 93% 95% 93% 94%    Intake/Output Summary (Last 24 hours) at 10/06/11 1405 Last data filed at 10/05/11 1500  Gross per 24 hour  Intake 593.75 ml  Output      0 ml  Net 593.75 ml    Exam:   General:  Drowsy , comfortable  Cardiovascular: s1s2 heard  Respiratory: CTAB, no wheezing or  rhonchi  Abdomen: soft tenderness present. No guarding. BS+  Data Reviewed: Basic Metabolic Panel:  Lab 10/06/11 6962 10/05/11 0305  NA 136 135  K 3.8 3.7  CL 101 95*  CO2 25 25  GLUCOSE 83 133*  BUN 8 11  CREATININE 0.70 0.54  CALCIUM 9.0 9.9  MG -- --  PHOS -- --   Liver Function Tests:  Lab 10/06/11 0349 10/05/11 0305  AST 144* 157*  ALT 85* 81*  ALKPHOS 201* 228*  BILITOT 5.4* 3.7*  PROT 6.4 7.9  ALBUMIN 2.9* 3.6    Lab 10/06/11 0340 10/05/11 0305  LIPASE 40 270*  AMYLASE -- 226*   No results found for this basename: AMMONIA:5 in the last 168 hours CBC:  Lab 10/06/11 0349 10/05/11 0305  WBC 7.0 14.0*  NEUTROABS -- 11.5*  HGB 11.6* 14.2  HCT 36.6 41.9  MCV 96.3 92.3  PLT 335 485*   Cardiac Enzymes: No results found for this basename: CKTOTAL:5,CKMB:5,CKMBINDEX:5,TROPONINI:5 in the last 168 hours BNP (last 3 results) No results found for this basename: PROBNP:3 in the last 8760 hours CBG: No results found for this basename: GLUCAP:5 in the last 168 hours  Recent Results (from the past 240 hour(s))  URINE CULTURE     Status: Normal   Collection Time   10/05/11  3:36 AM      Component Value Range Status Comment   Specimen Description URINE, CLEAN CATCH   Final    Special Requests Normal   Final    Culture  Setup Time [id number]   Final    Colony Count 60,000 COLONIES/ML   Final    Culture     Final    Value: Multiple bacterial morphotypes present, none predominant. Suggest appropriate recollection if clinically indicated.   Report Status 10/06/2011 FINAL   Final      Studies: US Abdomen Complete  10/05/2011  *RADIOLOGY REPORT*  Clinical Data:  Pancreatitis.  Elevated liver enzymes.  COMPLETE ABDOMINAL ULTRASOUND  Comparison:  CT abdomen and pelvis 09/27/2011 and 09/12.  Findings:  Gallbladder:  Removed.  Common bile duct:  Measures 1.4 cm.  There appears to be some sludge within the common bile duct but no discrete stone is identified.  Liver:   Intrahepatic biliary ductal dilatation is seen as on the most recent CT scan.  No focal liver lesion.  IVC:  Appears normal.  Pancreas:  3.1 cm hypoechoic collection in the pancreatic head is consistent with the small fluid collection seen on the most recent CT scan.  Spleen:  Measures  Right Kidney:  Measures 10.6 cm and appears normal.  Left Kidney:  Measures 11.0 cm and appears normal.  Abdominal aorta:  No aneurysm identified.  IMPRESSION:  1.  Intra and extrahepatic biliary ductal dilatation.  Sludge is identified within the common bile duct.  No ductal stone is seen. 2.  Status post cholecystectomy. 3.  Low attenuation collection in the pancreatic head is seen as on the most recent CT scans compatible with a pseudocyst.  Original Report Authenticated By: Bernadene Bell. D'ALESSIO, M.D.   Mr 3d Recon At Scanner  10/06/2011  *RADIOLOGY REPORT*  Clinical Data:  Pancreatitis.  Abnormal liver function tests. Biliary ductal dilatation.  MRI ABDOMEN WITHOUT CONTRAST (MRCP)  Technique: Multiplanar multisequence MR imaging of the abdomen was performed, including heavily T2-weighted images of the biliary and pancreatic ducts.  Three-dimensional MR images were rendered by post processing of the original MR data.  Comparison:  CT of abdomen pelvis 09/27/2011.  Findings:  There are two pancreatic pseudocysts present, one in the head of the pancreas (measuring approximately 2.8 x 1.8 x 1.6 cm), and the other in the neck (measuring approximately 1.1 x 1.4 x 1.5 cm).  The largest lesion is in the head of the pancreas demonstrates heterogeneously intermediate and high signal intensity on T1-weighted imaging, and heterogeneously high and low signal intensity on T2-weighted imaging, without significant areas of internal enhancement on post-gadolinium imaging, consistent with some internal debris.  The smaller lesion is more uniformly low signal intensity on T1, high signal intensity on T2, without significant enhancement,  suggesting less internal debris. The larger of these two lesions appears to exerts some mass effect upon the distal common bile duct, and there is heterogeneous signal intensity within the distal common bile duct with high signal intensity on T2-weighted images (low on T1) in the nondependent portion of the duct and low signal intensity dependently (high on T1), indicative of layering biliary sludge.  Main pancreatic duct is only mildly dilated at 4 mm. The main pancreatic duct is well visualized in the body and tail of the pancreas, but cannot be visualized distal to the pancreatic pseudocyst in the pancreatic head.  Above the pancreatic head pseudocyst the common bile duct is dilated to 18 mm and there is severe diffuse intrahepatic biliary ductal dilatation.  However, the common bile duct tapers to a more normal caliber (5 mm) below the pancreatic pseudocyst.  Other incidental findings include:  - Multiple sub centimeter renal lesions bilaterally  that are low signal intensity on T1-weighted images, high signal intensity on T2- weighted images, and do not enhance, consistent with small renal cysts. - The patient is status post cholecystectomy.  The appearance of the spleen and bilateral adrenal glands is unremarkable. - Mild narrowing at the ostium of the celiac axis again noted. - A moderate sized hiatal hernia.  IMPRESSION: 1.  Severe dilatation of intrahepatic biliary tree and common bile duct which abruptly tapers to a more normal caliber as the distal common bile duct passes adjacent to a complex pancreatic head pseudocyst.  No distal ductal stone is identified.  However, there is a large amount of biliary sludge in the distal common bile duct. 2.  The main pancreatic duct is only mildly dilated, measuring 4 mm.  However, as this duct extends into the head of the pancreas it is not visualized once it comes in proximity to the pancreatic head pseudocyst.  This could suggest significant mass effect upon the  duct, or communication of the duct with this pseudocyst.  This pancreatic head pseudocyst is highly complex with internal debris (no evidence of internal enhancement to suggest neoplasm at this time). 3.  Smaller simple pseudocysts in the neck of the pancreas, as above. 4.  Additional incidental findings, as above.  Original Report Authenticated By: Florencia Reasons, M.D.   Mr Abd W/wo Cm/mrcp  10/06/2011  *RADIOLOGY REPORT*  Clinical Data:  Pancreatitis.  Abnormal liver function tests. Biliary ductal dilatation.  MRI ABDOMEN WITHOUT CONTRAST (MRCP)  Technique: Multiplanar multisequence MR imaging of the abdomen was performed, including heavily T2-weighted images of the biliary and pancreatic ducts.  Three-dimensional MR images were rendered by post processing of the original MR data.  Comparison:  CT of abdomen pelvis 09/27/2011.  Findings:  There are two pancreatic pseudocysts present, one in the head of the pancreas (measuring approximately 2.8 x 1.8 x 1.6 cm), and the other in the neck (measuring approximately 1.1 x 1.4 x 1.5 cm).  The largest lesion is in the head of the pancreas demonstrates heterogeneously intermediate and high signal intensity on T1-weighted imaging, and heterogeneously high and low signal intensity on T2-weighted imaging, without significant areas of internal enhancement on post-gadolinium imaging, consistent with some internal debris.  The smaller lesion is more uniformly low signal intensity on T1, high signal intensity on T2, without significant enhancement, suggesting less internal debris. The larger of these two lesions appears to exerts some mass effect upon the distal common bile duct, and there is heterogeneous signal intensity within the distal common bile duct with high signal intensity on T2-weighted images (low on T1) in the nondependent portion of the duct and low signal intensity dependently (high on T1), indicative of layering biliary sludge.  Main pancreatic duct is  only mildly dilated at 4 mm. The main pancreatic duct is well visualized in the body and tail of the pancreas, but cannot be visualized distal to the pancreatic pseudocyst in the pancreatic head.  Above the pancreatic head pseudocyst the common bile duct is dilated to 18 mm and there is severe diffuse intrahepatic biliary ductal dilatation.  However, the common bile duct tapers to a more normal caliber (5 mm) below the pancreatic pseudocyst.  Other incidental findings include:  - Multiple sub centimeter renal lesions bilaterally that are low signal intensity on T1-weighted images, high signal intensity on T2- weighted images, and do not enhance, consistent with small renal cysts. - The patient is status post cholecystectomy.  The appearance of the  spleen and bilateral adrenal glands is unremarkable. - Mild narrowing at the ostium of the celiac axis again noted. - A moderate sized hiatal hernia.  IMPRESSION: 1.  Severe dilatation of intrahepatic biliary tree and common bile duct which abruptly tapers to a more normal caliber as the distal common bile duct passes adjacent to a complex pancreatic head pseudocyst.  No distal ductal stone is identified.  However, there is a large amount of biliary sludge in the distal common bile duct. 2.  The main pancreatic duct is only mildly dilated, measuring 4 mm.  However, as this duct extends into the head of the pancreas it is not visualized once it comes in proximity to the pancreatic head pseudocyst.  This could suggest significant mass effect upon the duct, or communication of the duct with this pseudocyst.  This pancreatic head pseudocyst is highly complex with internal debris (no evidence of internal enhancement to suggest neoplasm at this time). 3.  Smaller simple pseudocysts in the neck of the pancreas, as above. 4.  Additional incidental findings, as above.  Original Report Authenticated By: Florencia Reasons, M.D.    Scheduled Meds:   . sodium chloride    Intravenous STAT  . antiseptic oral rinse  15 mL Mouth Rinse q12n4p  . enoxaparin  40 mg Subcutaneous Q24H  . pantoprazole (PROTONIX) IV  40 mg Intravenous Q24H  . DISCONTD: antiseptic oral rinse  15 mL Mouth Rinse BID  . DISCONTD: chlorhexidine  15 mL Mouth Rinse BID   Continuous Infusions:

## 2011-10-07 ENCOUNTER — Encounter (HOSPITAL_COMMUNITY): Payer: Self-pay

## 2011-10-07 ENCOUNTER — Encounter (HOSPITAL_COMMUNITY): Payer: Self-pay | Admitting: Gastroenterology

## 2011-10-07 ENCOUNTER — Inpatient Hospital Stay (HOSPITAL_COMMUNITY): Payer: Self-pay

## 2011-10-07 DIAGNOSIS — R740 Nonspecific elevation of levels of transaminase and lactic acid dehydrogenase [LDH]: Secondary | ICD-10-CM

## 2011-10-07 DIAGNOSIS — D62 Acute posthemorrhagic anemia: Secondary | ICD-10-CM | POA: Diagnosis present

## 2011-10-07 DIAGNOSIS — K859 Acute pancreatitis without necrosis or infection, unspecified: Secondary | ICD-10-CM

## 2011-10-07 DIAGNOSIS — F1010 Alcohol abuse, uncomplicated: Secondary | ICD-10-CM

## 2011-10-07 MED ORDER — FAT EMULSION 20 % IV EMUL
240.0000 mL | INTRAVENOUS | Status: AC
  Administered 2011-10-07: 240 mL via INTRAVENOUS
  Filled 2011-10-07: qty 250

## 2011-10-07 MED ORDER — LORAZEPAM 2 MG/ML IJ SOLN
0.0000 mg | Freq: Four times a day (QID) | INTRAMUSCULAR | Status: AC
  Administered 2011-10-07: 1 mg via INTRAVENOUS
  Filled 2011-10-07 (×2): qty 1

## 2011-10-07 MED ORDER — DIPHENHYDRAMINE HCL 12.5 MG/5ML PO ELIX: 12.5000 mg | ORAL_SOLUTION | Freq: Four times a day (QID) | ORAL | Status: DC | PRN

## 2011-10-07 MED ORDER — NALOXONE HCL 0.4 MG/ML IJ SOLN: 0.4000 mg | INTRAMUSCULAR | Status: DC | PRN

## 2011-10-07 MED ORDER — LORAZEPAM 1 MG PO TABS
1.0000 mg | ORAL_TABLET | Freq: Four times a day (QID) | ORAL | Status: AC | PRN
  Administered 2011-10-07 – 2011-10-08 (×3): 1 mg via ORAL
  Filled 2011-10-07 (×5): qty 1

## 2011-10-07 MED ORDER — ZOLPIDEM TARTRATE 5 MG PO TABS
5.0000 mg | ORAL_TABLET | Freq: Every evening | ORAL | Status: DC | PRN
  Administered 2011-10-07: 5 mg via ORAL
  Filled 2011-10-07: qty 1

## 2011-10-07 MED ORDER — LORAZEPAM 2 MG/ML IJ SOLN
1.0000 mg | Freq: Four times a day (QID) | INTRAMUSCULAR | Status: AC | PRN
  Administered 2011-10-07 – 2011-10-09 (×2): 1 mg via INTRAVENOUS
  Filled 2011-10-07 (×2): qty 1

## 2011-10-07 MED ORDER — HYDROMORPHONE 0.3 MG/ML IV SOLN
INTRAVENOUS | Status: DC
  Administered 2011-10-07: 3.81 mg via INTRAVENOUS
  Administered 2011-10-07: 0.7 mg via INTRAVENOUS
  Administered 2011-10-07: 0.8 mg via INTRAVENOUS
  Administered 2011-10-07: 10:00:00 via INTRAVENOUS
  Administered 2011-10-08 (×2): 0.99 mg via INTRAVENOUS
  Administered 2011-10-08: 0.3 mg via INTRAVENOUS
  Administered 2011-10-08: 1.39 mg via INTRAVENOUS
  Administered 2011-10-08: 2.19 mg via INTRAVENOUS
  Administered 2011-10-08: 04:00:00 via INTRAVENOUS
  Administered 2011-10-08: 3.19 mg via INTRAVENOUS
  Administered 2011-10-08: 1.39 mg via INTRAVENOUS
  Administered 2011-10-09: 09:00:00 via INTRAVENOUS
  Administered 2011-10-09: 5.39 mg via INTRAVENOUS
  Administered 2011-10-09: 1.79 mg via INTRAVENOUS
  Administered 2011-10-09: 0.999 mg via INTRAVENOUS
  Administered 2011-10-09: 2 mg via INTRAVENOUS
  Administered 2011-10-09: 19:00:00 via INTRAVENOUS
  Administered 2011-10-09: 3.4 mg via INTRAVENOUS
  Administered 2011-10-10: 2.19 mg via INTRAVENOUS
  Administered 2011-10-10: 4.93 mg via INTRAVENOUS
  Administered 2011-10-10: 1.59 mg via INTRAVENOUS
  Administered 2011-10-10: 2.99 mg via INTRAVENOUS
  Administered 2011-10-10 (×2): via INTRAVENOUS
  Administered 2011-10-10: 1.39 mg via INTRAVENOUS
  Administered 2011-10-11: 3.74 mg via INTRAVENOUS
  Administered 2011-10-11: 3.99 mg via INTRAVENOUS
  Administered 2011-10-11: 22:00:00 via INTRAVENOUS
  Administered 2011-10-11: 2.99 mg via INTRAVENOUS
  Administered 2011-10-11: 4.19 mg via INTRAVENOUS
  Administered 2011-10-11: 5.74 mg via INTRAVENOUS
  Administered 2011-10-11: 03:00:00 via INTRAVENOUS
  Administered 2011-10-11: 2.79 mg via INTRAVENOUS
  Administered 2011-10-12: 07:00:00 via INTRAVENOUS
  Administered 2011-10-12: 6.59 mg via INTRAVENOUS
  Filled 2011-10-07 (×11): qty 25

## 2011-10-07 MED ORDER — LORAZEPAM 2 MG/ML IJ SOLN
0.0000 mg | Freq: Two times a day (BID) | INTRAMUSCULAR | Status: AC
  Administered 2011-10-09: 2 mg via INTRAVENOUS
  Administered 2011-10-10: 1 mg via INTRAVENOUS
  Filled 2011-10-07: qty 1

## 2011-10-07 MED ORDER — DIPHENHYDRAMINE HCL 50 MG/ML IJ SOLN: 12.5000 mg | Freq: Four times a day (QID) | INTRAMUSCULAR | Status: DC | PRN

## 2011-10-07 MED ORDER — VITAMIN B-1 100 MG PO TABS
100.0000 mg | ORAL_TABLET | Freq: Every day | ORAL | Status: DC
  Administered 2011-10-12 – 2011-10-14 (×3): 100 mg via ORAL
  Filled 2011-10-07 (×8): qty 1

## 2011-10-07 MED ORDER — ONDANSETRON HCL 4 MG/2ML IJ SOLN
4.0000 mg | Freq: Four times a day (QID) | INTRAMUSCULAR | Status: DC | PRN
  Administered 2011-10-07 – 2011-10-14 (×16): 4 mg via INTRAVENOUS
  Filled 2011-10-07 (×17): qty 2

## 2011-10-07 MED ORDER — IOHEXOL 300 MG/ML  SOLN: 25.0000 mL | Freq: Once | INTRAMUSCULAR | Status: AC | PRN

## 2011-10-07 MED ORDER — ZINC TRACE METAL 1 MG/ML IV SOLN
INTRAVENOUS | Status: AC
  Administered 2011-10-07: 18:00:00 via INTRAVENOUS
  Filled 2011-10-07: qty 1000

## 2011-10-07 MED ORDER — SODIUM CHLORIDE 0.9 % IJ SOLN: 9.0000 mL | INTRAMUSCULAR | Status: DC | PRN

## 2011-10-07 MED ORDER — FOLIC ACID 5 MG/ML IJ SOLN
1.0000 mg | Freq: Every day | INTRAMUSCULAR | Status: DC
  Administered 2011-10-07 – 2011-10-11 (×5): 1 mg via INTRAVENOUS
  Filled 2011-10-07 (×8): qty 0.2

## 2011-10-07 MED ORDER — THIAMINE HCL 100 MG/ML IJ SOLN
100.0000 mg | Freq: Every day | INTRAMUSCULAR | Status: DC
  Administered 2011-10-07 – 2011-10-11 (×5): 100 mg via INTRAVENOUS
  Filled 2011-10-07 (×7): qty 1

## 2011-10-07 MED ORDER — SODIUM CHLORIDE 0.9 % IV SOLN
1.0000 mg | Freq: Every day | INTRAVENOUS | Status: DC
  Filled 2011-10-07: qty 0.2

## 2011-10-07 NOTE — Progress Notes (Signed)
Attempted PICC insertion in right upper arm (basilic vein) x2.  Unable to advance past axillary vein Sites U. Pt tolerated procedure well.  Will refer to IR for insertion

## 2011-10-07 NOTE — Progress Notes (Signed)
PROGRESS NOTE  [name] WUJ:[id number] DOB: [birth year] DOA: 10/05/2011 PCP: Thayer Headings, MD  Brief narrative: [name] is a 56 year old female with past medical history of alcohol abuse and prior history of pancreatitis and pancreatic pseudocyst in the head of the who was admitted on 10/05/2011 with recurrent pancreatitis. Because of elevated liver function tests, a GI consultation was requested and an MRCP was done 10/06/2011 which showed dilated common bile duct with biliary sludge. She subsequently underwent an ERCP with sphincterotomy on 10/06/2011. There were clots and a small stone noted in her common bile duct, which were cleared during the procedure. The bleeding was felt to be due to a communication between the pseudocyst and the common bile duct.  Interim History: [name] was stable overnight. The nursing staff report that she has been anxious and irritable.   Assessment/Plan: Principal Problem:  *Pancreatitis caused by a combination of alcoholism, obstruction of the common bile duct by blood clots and a small stone, and extrinsic compression of the common bile duct by pancreatic pseudocyst  Status post MRCP/ERCP with sphincterotomy.  Placed on empiric antibiotics do to a hemorrhagic pseudocyst with communication with the common bile duct.  We'll place PICC line and start TPN.  Maintain n.p.o. status otherwise.  PCA will be started for pain control. Active Problems:  Alcohol abuse  Placed on detox IV Ativan protocol per [facility].  Social worker consultation for help with post discharge alcohol counseling.  Elevated liver function tests  Secondary common bile duct obstruction status post ERCP with sphincterotomy. We'll monitor.  GERD (gastroesophageal reflux disease)  Continue IV PPI therapy.  Acute blood loss anemia  Monitor hemoglobin and hematocrit. Secondary to hemorrhagic pseudocyst.   Code Status: Full Code Family Communication: None at  bedside. Disposition Plan: Home, when stable.  Medical Consultants:  Dr. Luisa Hart [name], Gastroenterology  Other consultants:  Pharmacy  Antibiotics:  Primaxin 10/06/2011--->  Unasyn 10/06/1998--->10/06/11  Rocephin 10/05/2011---> 10/05/2011   Subjective  [name] feels anxious. She admits to alcoholism. She has nausea and abdominal pain. She has not moved her bowels in the last few days. Denies flatus.   Objective   Objective: Filed Vitals:   10/06/11 1820 10/06/11 1840 10/06/11 2212 10/07/11 0531  BP: 145/88 145/83 153/67 146/71  Pulse: 88 81 92 81  Temp:  98.1 F (36.7 C) 98.3 F (36.8 C) 97.9 F (36.6 C)  TempSrc:  Oral Oral Oral  Resp: 13 16 17 15   Height:      Weight:      SpO2: 96% 97% 92% 94%   No intake or output data in the 24 hours ending 10/07/11 9562  Exam: Gen:  NAD Cardiovascular:  RRR, No M/R/G Respiratory: Lungs CTAB Gastrointestinal: Abdomen soft, NT/ND with normal active bowel sounds. Extremities: No C/E/C  Data Reviewed: Basic Metabolic Panel:  Lab 10/06/11 1308 10/05/11 0305  NA 136 135  K 3.8 3.7  CL 101 95*  CO2 25 25  GLUCOSE 83 133*  BUN 8 11  CREATININE 0.70 0.54  CALCIUM 9.0 9.9  MG -- --  PHOS -- --   GFR Estimated Creatinine Clearance: 76.4 ml/min (by C-G formula based on Cr of 0.7). Liver Function Tests:  Lab 10/06/11 0349 10/05/11 0305  AST 144* 157*  ALT 85* 81*  ALKPHOS 201* 228*  BILITOT 5.4* 3.7*  PROT 6.4 7.9  ALBUMIN 2.9* 3.6    Lab 10/06/11 0340 10/05/11 0305  LIPASE 40 270*  AMYLASE -- 226*   Coagulation profile  Lab  10/06/11 0349  INR 0.97  PROTIME --    CBC:  Lab 10/06/11 0349 10/05/11 0305  WBC 7.0 14.0*  NEUTROABS -- 11.5*  HGB 11.6* 14.2  HCT 36.6 41.9  MCV 96.3 92.3  PLT 335 485*   Microbiology Recent Results (from the past 240 hour(s))  URINE CULTURE     Status: Normal   Collection Time   10/05/11  3:36 AM      Component Value Range Status Comment   Specimen Description  URINE, CLEAN CATCH   Final    Special Requests Normal   Final    Culture  Setup Time [id number]   Final    Colony Count 60,000 COLONIES/ML   Final    Culture     Final    Value: Multiple bacterial morphotypes present, none predominant. Suggest appropriate recollection if clinically indicated.   Report Status 10/06/2011 FINAL   Final     Procedures and Diagnostic Studies:  US Abdomen Complete 10/05/2011 IMPRESSION:  1.  Intra and extrahepatic biliary ductal dilatation.  Sludge is identified within the common bile duct.  No ductal stone is seen. 2.  Status post cholecystectomy. 3.  Low attenuation collection in the pancreatic head is seen as on the most recent CT scans compatible with a pseudocyst.  Original Report Authenticated By: Bernadene Bell. D'ALESSIO, M.D.    Mr 3d Recon At Scanner 10/06/2011 IMPRESSION: 1.  Severe dilatation of intrahepatic biliary tree and common bile duct which abruptly tapers to a more normal caliber as the distal common bile duct passes adjacent to a complex pancreatic head pseudocyst.  No distal ductal stone is identified.  However, there is a large amount of biliary sludge in the distal common bile duct. 2.  The main pancreatic duct is only mildly dilated, measuring 4 mm.  However, as this duct extends into the head of the pancreas it is not visualized once it comes in proximity to the pancreatic head pseudocyst.  This could suggest significant mass effect upon the duct, or communication of the duct with this pseudocyst.  This pancreatic head pseudocyst is highly complex with internal debris (no evidence of internal enhancement to suggest neoplasm at this time). 3.  Smaller simple pseudocysts in the neck of the pancreas, as above. 4.  Additional incidental findings, as above.  Original Report Authenticated By: Florencia Reasons, M.D.    Dg Ercp With Sphincterotomy 10/06/2011  IMPRESSION: Dilated common bile duct with filling defects in the common hepatic duct which could be  due to stones, sludge, or blood.  Sphincterotomy performed.  These images were submitted for radiologic interpretation only. Please see the procedural report for the amount of contrast and the fluoroscopy time utilized.  Original Report Authenticated By: Camelia Phenes, M.D.    Mr Abd W/wo Cm/mrcp 10/06/2011 IMPRESSION: 1.  Severe dilatation of intrahepatic biliary tree and common bile duct which abruptly tapers to a more normal caliber as the distal common bile duct passes adjacent to a complex pancreatic head pseudocyst.  No distal ductal stone is identified.  However, there is a large amount of biliary sludge in the distal common bile duct. 2.  The main pancreatic duct is only mildly dilated, measuring 4 mm.  However, as this duct extends into the head of the pancreas it is not visualized once it comes in proximity to the pancreatic head pseudocyst.  This could suggest significant mass effect upon the duct, or communication of the duct with this pseudocyst.  This pancreatic head  pseudocyst is highly complex with internal debris (no evidence of internal enhancement to suggest neoplasm at this time). 3.  Smaller simple pseudocysts in the neck of the pancreas, as above. 4.  Additional incidental findings, as above.  Original Report Authenticated By: Florencia Reasons, M.D.    Scheduled Meds:    . ampicillin-sulbactam (UNASYN) 1.5 g IVPB  1.5 g Intravenous Once  . antiseptic oral rinse  15 mL Mouth Rinse q12n4p  . HYDROmorphone PCA 0.3 mg/mL   Intravenous Q4H  . imipenem-cilastatin  500 mg Intravenous Q8H  . LORazepam  0.5 mg Oral Once  . pantoprazole (PROTONIX) IV  40 mg Intravenous Q24H  . DISCONTD: antiseptic oral rinse  15 mL Mouth Rinse BID  . DISCONTD: chlorhexidine  15 mL Mouth Rinse BID  . DISCONTD: enoxaparin  40 mg Subcutaneous Q24H   Continuous Infusions:     LOS: 2 days   Hillery Aldo, MD Pager [phone]  10/07/2011, 9:03 AM

## 2011-10-07 NOTE — Progress Notes (Signed)
INITIAL ADULT NUTRITION ASSESSMENT Date: 10/07/2011   Time: 12:39 PM Reason for Assessment: Consult  ASSESSMENT: Female 56 y.o.  Dx: Pancreatitis  Food/Nutrition Related Hx: Pt unable to eat for 1 day PTA r/t abdominal pain, nausea, and vomiting. Pt reports typically eating 3 meals/day with 1/2 gallon of vodka intake daily. Pt reports she has stopped drinking. Pt denies any changes in weight. Pt recently admitted for alcoholic pancreatitis. Pt getting thiamine and folic acid. Pt denies any problems chewing/swallowing. Pt currently denies any nausea or diarrhea. Pt had ERCP with removal of stones yesterday which showed hemorrhagic pseudocyst. Plan is for prolonged bowel rest. TNA scheduled to start tonight.   Hx:  Past Medical History  Diagnosis Date  . GERD (gastroesophageal reflux disease)   . Rosacea   . Elevated liver function tests   . Wears glasses   . Chronic diarrhea   . Pancreatitis 01/2010    2011 admission was second admission, 3rd in 02/2011  . Alcohol abuse     H/o withdrawal   . PONV (postoperative nausea and vomiting)    Related Meds:  Scheduled Meds:   . ampicillin-sulbactam (UNASYN) 1.5 g IVPB  1.5 g Intravenous Once  . antiseptic oral rinse  15 mL Mouth Rinse q12n4p  . folic acid  1 mg Intravenous Daily  . HYDROmorphone PCA 0.3 mg/mL   Intravenous Q4H  . imipenem-cilastatin  500 mg Intravenous Q8H  . LORazepam  0-4 mg Intravenous Q6H   Followed by  . LORazepam  0-4 mg Intravenous Q12H  . LORazepam  0.5 mg Oral Once  . pantoprazole (PROTONIX) IV  40 mg Intravenous Q24H  . thiamine  100 mg Oral Daily   Or  . thiamine  100 mg Intravenous Daily  . DISCONTD: enoxaparin  40 mg Subcutaneous Q24H  . DISCONTD: folic acid (FOLVITE) IVPB  1 mg Intravenous Daily   Continuous Infusions:   . TPN (CLINIMIX) +/- additives     And  . fat emulsion     PRN Meds:.diphenhydrAMINE, diphenhydrAMINE, LORazepam, LORazepam, LORazepam, naloxone, ondansetron (ZOFRAN) IV,  oxyCODONE, sodium chloride, zolpidem, DISCONTD: butamben-tetracaine-benzocaine, DISCONTD: diphenhydrAMINE, DISCONTD: fentaNYL, DISCONTD:  HYDROmorphone (DILAUDID) injection, DISCONTD: metoCLOPramide, DISCONTD: midazolam, DISCONTD: Omnipaque 350 mg/mL (50 mL) in 0.9% normal saline (50 mL), DISCONTD: ondansetron (ZOFRAN) IV, DISCONTD: ondansetron DISCONTD: ondansetron  Ht: 5\' 7"  (170.2 cm)  Wt: 153 lb 11.2 oz (69.718 kg)  Ideal Wt: 135 lb % Ideal Wt: 113  Usual Wt: 153 lb % Usual Wt: 100   Body mass index is 24.07 kg/(m^2).   Labs:  CMP     Component Value Date/Time   NA 136 10/06/2011 0349   K 3.8 10/06/2011 0349   CL 101 10/06/2011 0349   CO2 25 10/06/2011 0349   GLUCOSE 83 10/06/2011 0349   BUN 8 10/06/2011 0349   CREATININE 0.70 10/06/2011 0349   CALCIUM 9.0 10/06/2011 0349   PROT 6.4 10/06/2011 0349   ALBUMIN 2.9* 10/06/2011 0349   AST 144* 10/06/2011 0349   ALT 85* 10/06/2011 0349   ALKPHOS 201* 10/06/2011 0349   BILITOT 5.4* 10/06/2011 0349   GFRNONAA >90 10/06/2011 0349   GFRAA >90 10/06/2011 0349    Basename 10/06/11 0340  LIPASE 40    No intake or output data in the 24 hours ending 10/07/11 1248  Last BM - 10/04/11  Diet Order: NPO   IVF:    TPN (CLINIMIX) +/- additives  And  fat emulsion    Estimated Nutritional Needs:   Kcal:1775-2050  Protein:105-125g Fluid:1.7-2L  NUTRITION DIAGNOSIS: -Inadequate oral intake (NI-2.1).  Status: Ongoing  RELATED TO: acute pancreatitis with hemorrhagic pseudocyst  AS EVIDENCE BY: NPO  MONITORING/EVALUATION(Goals): TNA to meet >90% of estimated nutritional needs  EDUCATION NEEDS: -No education needs identified at this time  INTERVENTION: TNA per pharmacy. Will monitor.   Dietitian #: [phone]  DOCUMENTATION CODES Per approved criteria  -Not Applicable    Marshall Cork 10/07/2011, 12:39 PM

## 2011-10-07 NOTE — Progress Notes (Signed)
Subjective: Feeling okay.  No problems post procedure, however, she does feel that the current pain medication regimen is not effective for her.  Objective: Vital signs in last 24 hours: Temp:  [97.9 F (36.6 C)-98.8 F (37.1 C)] 97.9 F (36.6 C) (06/19 0531) Pulse Rate:  [18-98] 81  (06/19 0531) Resp:  [13-22] 15  (06/19 0531) BP: (131-162)/(66-101) 146/71 mmHg (06/19 0531) SpO2:  [91 %-100 %] 94 % (06/19 0531) FiO2 (%):  [93 %-100 %] 96 % (06/18 1820) Last BM Date: 10/04/11  Intake/Output from previous day:   Intake/Output this shift:    General appearance: alert and no distress GI: tender in the epigastric region  Lab Results:  Basename 10/06/11 0349 10/05/11 0305  WBC 7.0 14.0*  HGB 11.6* 14.2  HCT 36.6 41.9  PLT 335 485*   BMET  Basename 10/06/11 0349 10/05/11 0305  NA 136 135  K 3.8 3.7  CL 101 95*  CO2 25 25  GLUCOSE 83 133*  BUN 8 11  CREATININE 0.70 0.54  CALCIUM 9.0 9.9   LFT  Basename 10/06/11 0349  PROT 6.4  ALBUMIN 2.9*  AST 144*  ALT 85*  ALKPHOS 201*  BILITOT 5.4*  BILIDIR --  IBILI --   PT/INR  Basename 10/06/11 0349  LABPROT 13.1  INR 0.97   Hepatitis Panel No results found for this basename: HEPBSAG,HCVAB,HEPAIGM,HEPBIGM in the last 72 hours C-Diff No results found for this basename: CDIFFTOX:3 in the last 72 hours Fecal Lactopherrin No results found for this basename: FECLLACTOFRN in the last 72 hours  Studies/Results: US Abdomen Complete  10/05/2011  *RADIOLOGY REPORT*  Clinical Data:  Pancreatitis.  Elevated liver enzymes.  COMPLETE ABDOMINAL ULTRASOUND  Comparison:  CT abdomen and pelvis 09/27/2011 and 09/12.  Findings:  Gallbladder:  Removed.  Common bile duct:  Measures 1.4 cm.  There appears to be some sludge within the common bile duct but no discrete stone is identified.  Liver:  Intrahepatic biliary ductal dilatation is seen as on the most recent CT scan.  No focal liver lesion.  IVC:  Appears normal.  Pancreas:  3.1  cm hypoechoic collection in the pancreatic head is consistent with the small fluid collection seen on the most recent CT scan.  Spleen:  Measures  Right Kidney:  Measures 10.6 cm and appears normal.  Left Kidney:  Measures 11.0 cm and appears normal.  Abdominal aorta:  No aneurysm identified.  IMPRESSION:  1.  Intra and extrahepatic biliary ductal dilatation.  Sludge is identified within the common bile duct.  No ductal stone is seen. 2.  Status post cholecystectomy. 3.  Low attenuation collection in the pancreatic head is seen as on the most recent CT scans compatible with a pseudocyst.  Original Report Authenticated By: Bernadene Bell. D'ALESSIO, M.D.   Mr 3d Recon At Scanner  10/06/2011  *RADIOLOGY REPORT*  Clinical Data:  Pancreatitis.  Abnormal liver function tests. Biliary ductal dilatation.  MRI ABDOMEN WITHOUT CONTRAST (MRCP)  Technique: Multiplanar multisequence MR imaging of the abdomen was performed, including heavily T2-weighted images of the biliary and pancreatic ducts.  Three-dimensional MR images were rendered by post processing of the original MR data.  Comparison:  CT of abdomen pelvis 09/27/2011.  Findings:  There are two pancreatic pseudocysts present, one in the head of the pancreas (measuring approximately 2.8 x 1.8 x 1.6 cm), and the other in the neck (measuring approximately 1.1 x 1.4 x 1.5 cm).  The largest lesion is in the head of the  pancreas demonstrates heterogeneously intermediate and high signal intensity on T1-weighted imaging, and heterogeneously high and low signal intensity on T2-weighted imaging, without significant areas of internal enhancement on post-gadolinium imaging, consistent with some internal debris.  The smaller lesion is more uniformly low signal intensity on T1, high signal intensity on T2, without significant enhancement, suggesting less internal debris. The larger of these two lesions appears to exerts some mass effect upon the distal common bile duct, and there is  heterogeneous signal intensity within the distal common bile duct with high signal intensity on T2-weighted images (low on T1) in the nondependent portion of the duct and low signal intensity dependently (high on T1), indicative of layering biliary sludge.  Main pancreatic duct is only mildly dilated at 4 mm. The main pancreatic duct is well visualized in the body and tail of the pancreas, but cannot be visualized distal to the pancreatic pseudocyst in the pancreatic head.  Above the pancreatic head pseudocyst the common bile duct is dilated to 18 mm and there is severe diffuse intrahepatic biliary ductal dilatation.  However, the common bile duct tapers to a more normal caliber (5 mm) below the pancreatic pseudocyst.  Other incidental findings include:  - Multiple sub centimeter renal lesions bilaterally that are low signal intensity on T1-weighted images, high signal intensity on T2- weighted images, and do not enhance, consistent with small renal cysts. - The patient is status post cholecystectomy.  The appearance of the spleen and bilateral adrenal glands is unremarkable. - Mild narrowing at the ostium of the celiac axis again noted. - A moderate sized hiatal hernia.  IMPRESSION: 1.  Severe dilatation of intrahepatic biliary tree and common bile duct which abruptly tapers to a more normal caliber as the distal common bile duct passes adjacent to a complex pancreatic head pseudocyst.  No distal ductal stone is identified.  However, there is a large amount of biliary sludge in the distal common bile duct. 2.  The main pancreatic duct is only mildly dilated, measuring 4 mm.  However, as this duct extends into the head of the pancreas it is not visualized once it comes in proximity to the pancreatic head pseudocyst.  This could suggest significant mass effect upon the duct, or communication of the duct with this pseudocyst.  This pancreatic head pseudocyst is highly complex with internal debris (no evidence of  internal enhancement to suggest neoplasm at this time). 3.  Smaller simple pseudocysts in the neck of the pancreas, as above. 4.  Additional incidental findings, as above.  Original Report Authenticated By: Florencia Reasons, M.D.   Dg Ercp With Sphincterotomy  10/06/2011  *RADIOLOGY REPORT*  Clinical Data: Pancreatitis  ERCP  Comparison:  MRI 10/05/2011, CT 09/27/2011  Technique:  Multiple spot images obtained with the fluoroscopic device and submitted for interpretation post-procedure.  ERCP was performed by Dr. Elnoria Howard.  Findings: Digital C-arm images were obtained.  Contrast is injected in the common bile duct.  The common bile duct is moderately dilated.  There are filling defects in the upper common bile duct which could be due to stones,  blood or sludge.  Sphincterotomy was performed.  There is increased density overlying the common bile duct  but outside of the duct.  This may be contrast in the bowel from prior CT.  Less likely is extravasated contrast from injection.  This is present at the beginning of the study did not change throughout the study.  IMPRESSION: Dilated common bile duct with filling defects in  the common hepatic duct which could be due to stones, sludge, or blood.  Sphincterotomy performed.  These images were submitted for radiologic interpretation only. Please see the procedural report for the amount of contrast and the fluoroscopy time utilized.  Original Report Authenticated By: Camelia Phenes, M.D.   Mr Abd W/wo Cm/mrcp  10/06/2011  *RADIOLOGY REPORT*  Clinical Data:  Pancreatitis.  Abnormal liver function tests. Biliary ductal dilatation.  MRI ABDOMEN WITHOUT CONTRAST (MRCP)  Technique: Multiplanar multisequence MR imaging of the abdomen was performed, including heavily T2-weighted images of the biliary and pancreatic ducts.  Three-dimensional MR images were rendered by post processing of the original MR data.  Comparison:  CT of abdomen pelvis 09/27/2011.  Findings:  There are  two pancreatic pseudocysts present, one in the head of the pancreas (measuring approximately 2.8 x 1.8 x 1.6 cm), and the other in the neck (measuring approximately 1.1 x 1.4 x 1.5 cm).  The largest lesion is in the head of the pancreas demonstrates heterogeneously intermediate and high signal intensity on T1-weighted imaging, and heterogeneously high and low signal intensity on T2-weighted imaging, without significant areas of internal enhancement on post-gadolinium imaging, consistent with some internal debris.  The smaller lesion is more uniformly low signal intensity on T1, high signal intensity on T2, without significant enhancement, suggesting less internal debris. The larger of these two lesions appears to exerts some mass effect upon the distal common bile duct, and there is heterogeneous signal intensity within the distal common bile duct with high signal intensity on T2-weighted images (low on T1) in the nondependent portion of the duct and low signal intensity dependently (high on T1), indicative of layering biliary sludge.  Main pancreatic duct is only mildly dilated at 4 mm. The main pancreatic duct is well visualized in the body and tail of the pancreas, but cannot be visualized distal to the pancreatic pseudocyst in the pancreatic head.  Above the pancreatic head pseudocyst the common bile duct is dilated to 18 mm and there is severe diffuse intrahepatic biliary ductal dilatation.  However, the common bile duct tapers to a more normal caliber (5 mm) below the pancreatic pseudocyst.  Other incidental findings include:  - Multiple sub centimeter renal lesions bilaterally that are low signal intensity on T1-weighted images, high signal intensity on T2- weighted images, and do not enhance, consistent with small renal cysts. - The patient is status post cholecystectomy.  The appearance of the spleen and bilateral adrenal glands is unremarkable. - Mild narrowing at the ostium of the celiac axis again noted.  - A moderate sized hiatal hernia.  IMPRESSION: 1.  Severe dilatation of intrahepatic biliary tree and common bile duct which abruptly tapers to a more normal caliber as the distal common bile duct passes adjacent to a complex pancreatic head pseudocyst.  No distal ductal stone is identified.  However, there is a large amount of biliary sludge in the distal common bile duct. 2.  The main pancreatic duct is only mildly dilated, measuring 4 mm.  However, as this duct extends into the head of the pancreas it is not visualized once it comes in proximity to the pancreatic head pseudocyst.  This could suggest significant mass effect upon the duct, or communication of the duct with this pseudocyst.  This pancreatic head pseudocyst is highly complex with internal debris (no evidence of internal enhancement to suggest neoplasm at this time). 3.  Smaller simple pseudocysts in the neck of the pancreas, as above. 4.  Additional  incidental findings, as above.  Original Report Authenticated By: Florencia Reasons, M.D.    Medications:  Scheduled:   . ampicillin-sulbactam (UNASYN) 1.5 g IVPB  1.5 g Intravenous Once  . antiseptic oral rinse  15 mL Mouth Rinse q12n4p  . HYDROmorphone PCA 0.3 mg/mL   Intravenous Q4H  . imipenem-cilastatin  500 mg Intravenous Q8H  . LORazepam  0.5 mg Oral Once  . pantoprazole (PROTONIX) IV  40 mg Intravenous Q24H  . DISCONTD: antiseptic oral rinse  15 mL Mouth Rinse BID  . DISCONTD: chlorhexidine  15 mL Mouth Rinse BID  . DISCONTD: enoxaparin  40 mg Subcutaneous Q24H   Continuous:   Assessment/Plan: 1) Hemorrhagic pseudocyst. 2) Hemobilia secondary to the hemorrhagic pseudocyst. 3) ETOH pancreatitis. 4) ETOH abuse.   The patient is stable at this time.  No reports of any fevers and she is on Primaxin.  The hemorrhagic pseudocyst does pose a significant problem and she will need to be monitored closely.  Hopefully with bowel rest/minimal stimulation her issues will resolve.  I  had a long discussion with her about her ETOH abuse and strongly advised her to seek professional counseling.  Plan: 1) Start PCA. 2) Maintain NPO for now. 3) Continue with Primaxin. 4) Follow CBC and liver panel.  LOS: 2 days   Angelicia Lessner D 10/07/2011, 9:07 AM

## 2011-10-07 NOTE — Progress Notes (Signed)
IV team unable to place PICC line.  Contacted IR for PICC line placement.  Pt informed.

## 2011-10-07 NOTE — Progress Notes (Signed)
Pt is aware of NPO order and admits to sipping on water when the nurse is not in the room. She reports being "very hungry" and is thirsty as well. I explained to her the reasoning behind being NPO. She has tolerated those small sips of water with no problems. Pt also c/o severe abd pain and restlessness that is not relieved by Q4hr dilaidud 2mg  IV and oxy IR 5mg  PO Q4hr. Paged on call provider and no additional order was given. Spent 20 minutes w/ the patient discussing her recent procedure and providing education r/t her illness and pain mgmt. Eugene Garnet [facility] 10/07/2011 1:38 AM

## 2011-10-07 NOTE — Progress Notes (Signed)
IV team notified for need of PICC line

## 2011-10-07 NOTE — Progress Notes (Signed)
PARENTERAL NUTRITION CONSULT NOTE - INITIAL  Pharmacy Consult for  TNA Indication: pancreatitis  Allergies  Allergen Reactions  . Ciprofloxacin     REACTION: nausea    Patient Measurements: Height: 5\' 7"  (170.2 cm) Weight: 153 lb 11.2 oz (69.718 kg) IBW/kg (Calculated) : 61.6    Vital Signs: Temp: 97.9 F (36.6 C) (06/19 0531) Temp src: Oral (06/19 0531) BP: 146/71 mmHg (06/19 0531) Pulse Rate: 81  (06/19 0531) Intake/Output from previous day:   Intake/Output from this shift:    Labs:  [name] 10/06/11 0349 10/05/11 0305  WBC 7.0 14.0*  HGB 11.6* 14.2  HCT 36.6 41.9  PLT 335 485*  APTT 30 --  INR 0.97 --     Basename 10/06/11 0349 10/05/11 0305  NA 136 135  K 3.8 3.7  CL 101 95*  CO2 25 25  GLUCOSE 83 133*  BUN 8 11  CREATININE 0.70 0.54  LABCREA -- --  CREAT24HRUR -- --  CALCIUM 9.0 9.9  MG -- --  PHOS -- --  PROT 6.4 7.9  ALBUMIN 2.9* 3.6  AST 144* 157*  ALT 85* 81*  ALKPHOS 201* 228*  BILITOT 5.4* 3.7*  BILIDIR -- --  IBILI -- --  PREALBUMIN -- --  TRIG -- --  CHOLHDL -- --  CHOL -- --   Estimated Creatinine Clearance: 76.4 ml/min (by C-G formula based on Cr of 0.7).   No results found for this basename: GLUCAP:3 in the last 72 hours  Medical History: Past Medical History  Diagnosis Date  . GERD (gastroesophageal reflux disease)   . Rosacea   . Elevated liver function tests   . Wears glasses   . Chronic diarrhea   . Pancreatitis 01/2010    2011 admission was second admission, 3rd in 02/2011  . Alcohol abuse     H/o withdrawal   . PONV (postoperative nausea and vomiting)     Medications:  Scheduled:    . ampicillin-sulbactam (UNASYN) 1.5 g IVPB  1.5 g Intravenous Once  . antiseptic oral rinse  15 mL Mouth Rinse q12n4p  . folic acid  1 mg Intravenous Daily  . HYDROmorphone PCA 0.3 mg/mL   Intravenous Q4H  . imipenem-cilastatin  500 mg Intravenous Q8H  . LORazepam  0-4 mg Intravenous Q6H   Followed by  . LORazepam  0-4 mg  Intravenous Q12H  . LORazepam  0.5 mg Oral Once  . pantoprazole (PROTONIX) IV  40 mg Intravenous Q24H  . thiamine  100 mg Oral Daily   Or  . thiamine  100 mg Intravenous Daily  . DISCONTD: enoxaparin  40 mg Subcutaneous Q24H  . DISCONTD: folic acid (FOLVITE) IVPB  1 mg Intravenous Daily   Infusions:   PRN: diphenhydrAMINE, diphenhydrAMINE, LORazepam, LORazepam, LORazepam, naloxone, ondansetron (ZOFRAN) IV, oxyCODONE, sodium chloride, zolpidem, DISCONTD: butamben-tetracaine-benzocaine, DISCONTD: diphenhydrAMINE, DISCONTD: fentaNYL, DISCONTD:  HYDROmorphone (DILAUDID) injection, DISCONTD: metoCLOPramide, DISCONTD: midazolam, DISCONTD: Omnipaque 350 mg/mL (50 mL) in 0.9% normal saline (50 mL), DISCONTD: ondansetron (ZOFRAN) IV, DISCONTD: ondansetron DISCONTD: ondansetron  Insulin Requirements in the past 24 hours:  None (Not currently on SSI)  Current Nutrition:  NPO No MIVF  Assessment: 55 YOF w/ acute ETOH pancreatitis, TNA per pharmacy for bowel rest  Nutritional Goals:  Awaiting RD assessment  Plan:   Clinimix E 5/20% @ 2ml/hr to start tonight  Lipids/MVI and TE MWF only d/t shortage  TNA lab panel Mon and Faye Ramsay PharmD  [phone] 10/07/2011 12:07 PM

## 2011-10-08 DIAGNOSIS — K859 Acute pancreatitis without necrosis or infection, unspecified: Secondary | ICD-10-CM

## 2011-10-08 DIAGNOSIS — R7401 Elevation of levels of liver transaminase levels: Secondary | ICD-10-CM

## 2011-10-08 DIAGNOSIS — R740 Nonspecific elevation of levels of transaminase and lactic acid dehydrogenase [LDH]: Secondary | ICD-10-CM

## 2011-10-08 DIAGNOSIS — F1010 Alcohol abuse, uncomplicated: Secondary | ICD-10-CM

## 2011-10-08 LAB — COMPREHENSIVE METABOLIC PANEL
ALT: 81 U/L — ABNORMAL HIGH (ref 0–35)
AST: 112 U/L — ABNORMAL HIGH (ref 0–37)
Alkaline Phosphatase: 242 U/L — ABNORMAL HIGH (ref 39–117)
Calcium: 9.4 mg/dL (ref 8.4–10.5)
Potassium: 3.5 mEq/L (ref 3.5–5.1)
Sodium: 133 mEq/L — ABNORMAL LOW (ref 135–145)
Total Protein: 6.8 g/dL (ref 6.0–8.3)

## 2011-10-08 LAB — CBC
Platelets: 294 10*3/uL (ref 150–400)
RBC: 3.79 MIL/uL — ABNORMAL LOW (ref 3.87–5.11)
WBC: 7.7 10*3/uL (ref 4.0–10.5)

## 2011-10-08 LAB — DIFFERENTIAL
Eosinophils Absolute: 0.5 10*3/uL (ref 0.0–0.7)
Lymphocytes Relative: 16 % (ref 12–46)
Lymphs Abs: 1.2 10*3/uL (ref 0.7–4.0)
Neutro Abs: 5.5 10*3/uL (ref 1.7–7.7)
Neutrophils Relative %: 71 % (ref 43–77)

## 2011-10-08 LAB — GLUCOSE, CAPILLARY
Glucose-Capillary: 139 mg/dL — ABNORMAL HIGH (ref 70–99)
Glucose-Capillary: 136 mg/dL — ABNORMAL HIGH (ref 70–99)
Glucose-Capillary: 105 mg/dL — ABNORMAL HIGH (ref 70–99)

## 2011-10-08 LAB — CHOLESTEROL, TOTAL: Cholesterol: 211 mg/dL — ABNORMAL HIGH (ref 0–200)

## 2011-10-08 LAB — PHOSPHORUS: Phosphorus: 3.6 mg/dL (ref 2.3–4.6)

## 2011-10-08 LAB — TRIGLYCERIDES: Triglycerides: 196 mg/dL — ABNORMAL HIGH (ref <150)

## 2011-10-08 MED ORDER — INSULIN ASPART 100 UNIT/ML ~~LOC~~ SOLN
0.0000 [IU] | SUBCUTANEOUS | Status: DC
  Administered 2011-10-08: 1 [IU] via SUBCUTANEOUS
  Administered 2011-10-08 – 2011-10-09 (×2): 2 [IU] via SUBCUTANEOUS
  Administered 2011-10-09: 1 [IU] via SUBCUTANEOUS

## 2011-10-08 MED ORDER — LORAZEPAM 1 MG PO TABS
1.0000 mg | ORAL_TABLET | Freq: Two times a day (BID) | ORAL | Status: DC | PRN
  Administered 2011-10-08 – 2011-10-11 (×7): 1 mg via ORAL
  Filled 2011-10-08 (×3): qty 1

## 2011-10-08 MED ORDER — PANTOPRAZOLE SODIUM 40 MG IV SOLR
40.0000 mg | Freq: Two times a day (BID) | INTRAVENOUS | Status: DC
  Administered 2011-10-08 – 2011-10-11 (×7): 40 mg via INTRAVENOUS
  Filled 2011-10-08 (×10): qty 40

## 2011-10-08 MED ORDER — CLINIMIX E/DEXTROSE (5/20) 5 % IV SOLN
INTRAVENOUS | Status: AC
  Administered 2011-10-08: 19:00:00 via INTRAVENOUS
  Filled 2011-10-08: qty 2000

## 2011-10-08 NOTE — Progress Notes (Signed)
Subjective: Pain is under better control.  Objective: Vital signs in last 24 hours: Temp:  [98 F (36.7 C)-98.4 F (36.9 C)] 98.4 F (36.9 C) (06/20 1250) Pulse Rate:  [63-84] 69  (06/20 1250) Resp:  [14-18] 18  (06/20 1250) BP: (103-134)/(53-70) 113/69 mmHg (06/20 1250) SpO2:  [92 %-98 %] 95 % (06/20 1250) FiO2 (%):  [97 %] 97 % (06/19 1738) Last BM Date: 10/04/11  Intake/Output from previous day: 06/19 0701 - 06/20 0700 In: 302 [IV Piggyback:302] Out: 1 [Urine:1] Intake/Output this shift:    General appearance: alert and no distress GI: tender in the epigastric region  Lab Results:  Basename 10/08/11 0320 10/06/11 0349  WBC 7.7 7.0  HGB 11.5* 11.6*  HCT 35.8* 36.6  PLT 294 335   BMET  Basename 10/08/11 0320 10/06/11 0349  NA 133* 136  K 3.5 3.8  CL 96 101  CO2 27 25  GLUCOSE 173* 83  BUN 6 8  CREATININE 0.57 0.70  CALCIUM 9.4 9.0   LFT  Basename 10/08/11 0320  PROT 6.8  ALBUMIN 3.0*  AST 112*  ALT 81*  ALKPHOS 242*  BILITOT 3.4*  BILIDIR --  IBILI --   PT/INR  Basename 10/06/11 0349  LABPROT 13.1  INR 0.97   Hepatitis Panel No results found for this basename: HEPBSAG,HCVAB,HEPAIGM,HEPBIGM in the last 72 hours C-Diff No results found for this basename: CDIFFTOX:3 in the last 72 hours Fecal Lactopherrin No results found for this basename: FECLLACTOFRN in the last 72 hours  Studies/Results: Ir Veno/ext/uni Right  10/07/2011  *RADIOLOGY REPORT*  Clinical history:Pancreatitis and needs IV access.  PROCEDURE(S): RIGHT UPPER EXTREMITY CENTRAL VENOGRAM; PLACEMENT OF LEFT ARM PICC LINE WITH ULTRASOUND AND FLUOROSCOPIC GUIDANCE  Physician: Rachelle Hora. Henn, MD  Medications:None  Moderate sedation time:None  Fluoroscopy time: 3.2 minutes  Procedure:The procedure was explained to the patient.  The risks and benefits of the procedure were discussed and the patient's questions were addressed.  Informed consent was obtained from the patient.  Ultrasound  demonstrated patent right basilic vein.  The right arm was prepped and draped in a sterile fashion.   Maximal barrier sterile technique was utilized including caps, mask, sterile gowns, sterile gloves, sterile drape, hand hygiene and skin antiseptic.   Skin was anesthetized with lidocaine.  A 21 gauge needle was directed into the basilic vein with ultrasound guidance and a wire advanced into the right subclavian region.  A wire would not advance more centrally.  Peel-away sheath was placed.  A right upper extremity venogram was performed.  Wire would not advance into the right innominate vein.  The vascular sheath was removed with manual compression.  Attention was directed to the left arm.  Ultrasound demonstrated a patent left basilic vein.  The left arm was prepped and draped in a sterile fashion.  Maximal barrier sterile technique was utilized including caps, mask, sterile gowns, sterile gloves, sterile drape, hand hygiene and skin antiseptic.  Skin was anesthetized with lidocaine.  21 gauge needle was directed into the basilic vein with ultrasound guidance.  Nitrex wire easily advanced centrally. However, a peel-away sheath would not easily advance into the vein. Contrast was injected through the sheath as it was slowly withdrawn.  The sheath was found to be within a small branch vessel.  As a result, the sheath was directed into a larger vein. A dual lumen PICC line was cut to 42 cm and easily advanced into the central veins.  Tip of the catheter was placed  in the lower SVC.  Findings:Right central venogram:  The right subclavian vein is very small or may be occluded.  Small veins along the course of the right subclavian vein could represent collaterals.  There is a large collateral vein that drains into the neck.  The SVC is patent.  PICC line tip in the lower SVC.  Complications: None  Impression:Successful placement of a left arm PICC line with ultrasound and fluoroscopic guidance.  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  The right upper extremity drains through a large collateral vessel in the right neck.  The SVC is patent.  The patient is not a candidate for future PICC lines on the right side and would avoid the central venous access through the right subclavian vein.  Original Report Authenticated By: Richarda Overlie, M.D.   Ir Fluoro Guide Cv Line Left  10/07/2011  *RADIOLOGY REPORT*  Clinical history:Pancreatitis and needs IV access.  PROCEDURE(S): RIGHT UPPER EXTREMITY CENTRAL VENOGRAM; PLACEMENT OF LEFT ARM PICC LINE WITH ULTRASOUND AND FLUOROSCOPIC GUIDANCE  Physician: Rachelle Hora. Henn, MD  Medications:None  Moderate sedation time:None  Fluoroscopy time: 3.2 minutes  Procedure:The procedure was explained to the patient.  The risks and benefits of the procedure were discussed and the patient's questions were addressed.  Informed consent was obtained from the patient.  Ultrasound demonstrated patent right basilic vein.  The right arm was prepped and draped in a sterile fashion.   Maximal barrier sterile technique was utilized including caps, mask, sterile gowns, sterile gloves, sterile drape, hand hygiene and skin antiseptic.   Skin was anesthetized with lidocaine.  A 21 gauge needle was directed into the basilic vein with ultrasound guidance and a wire advanced into the right subclavian region.  A wire would not advance more centrally.  Peel-away sheath was placed.  A right upper extremity venogram was performed.  Wire would not advance into the right innominate vein.  The vascular sheath was removed with manual compression.  Attention was directed to the left arm.  Ultrasound demonstrated a patent left basilic vein.  The left arm was prepped and draped in a sterile fashion.  Maximal barrier sterile technique was utilized including caps, mask, sterile gowns, sterile gloves, sterile drape, hand hygiene and skin antiseptic.  Skin was  anesthetized with lidocaine.  21 gauge needle was directed into the basilic vein with ultrasound guidance.  Nitrex wire easily advanced centrally. However, a peel-away sheath would not easily advance into the vein. Contrast was injected through the sheath as it was slowly withdrawn.  The sheath was found to be within a small branch vessel.  As a result, the sheath was directed into a larger vein. A dual lumen PICC line was cut to 42 cm and easily advanced into the central veins.  Tip of the catheter was placed in the lower SVC.  Findings:Right central venogram:  The right subclavian vein is very small or may be occluded.  Small veins along the course of the right subclavian vein could represent collaterals.  There is a large collateral vein that drains into the neck.  The SVC is patent.  PICC line tip in the lower SVC.  Complications: None  Impression:Successful placement of a left arm PICC line with ultrasound and fluoroscopic guidance.  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  The right upper extremity drains through a large collateral vessel in the right neck.  The  SVC is patent.  The patient is not a candidate for future PICC lines on the right side and would avoid the central venous access through the right subclavian vein.  Original Report Authenticated By: Richarda Overlie, M.D.   Ir US Guide Vasc Access Left  10/07/2011  *RADIOLOGY REPORT*  Clinical history:Pancreatitis and needs IV access.  PROCEDURE(S): RIGHT UPPER EXTREMITY CENTRAL VENOGRAM; PLACEMENT OF LEFT ARM PICC LINE WITH ULTRASOUND AND FLUOROSCOPIC GUIDANCE  Physician: Rachelle Hora. Henn, MD  Medications:None  Moderate sedation time:None  Fluoroscopy time: 3.2 minutes  Procedure:The procedure was explained to the patient.  The risks and benefits of the procedure were discussed and the patient's questions were addressed.  Informed consent was obtained from the patient.  Ultrasound  demonstrated patent right basilic vein.  The right arm was prepped and draped in a sterile fashion.   Maximal barrier sterile technique was utilized including caps, mask, sterile gowns, sterile gloves, sterile drape, hand hygiene and skin antiseptic.   Skin was anesthetized with lidocaine.  A 21 gauge needle was directed into the basilic vein with ultrasound guidance and a wire advanced into the right subclavian region.  A wire would not advance more centrally.  Peel-away sheath was placed.  A right upper extremity venogram was performed.  Wire would not advance into the right innominate vein.  The vascular sheath was removed with manual compression.  Attention was directed to the left arm.  Ultrasound demonstrated a patent left basilic vein.  The left arm was prepped and draped in a sterile fashion.  Maximal barrier sterile technique was utilized including caps, mask, sterile gowns, sterile gloves, sterile drape, hand hygiene and skin antiseptic.  Skin was anesthetized with lidocaine.  21 gauge needle was directed into the basilic vein with ultrasound guidance.  Nitrex wire easily advanced centrally. However, a peel-away sheath would not easily advance into the vein. Contrast was injected through the sheath as it was slowly withdrawn.  The sheath was found to be within a small branch vessel.  As a result, the sheath was directed into a larger vein. A dual lumen PICC line was cut to 42 cm and easily advanced into the central veins.  Tip of the catheter was placed in the lower SVC.  Findings:Right central venogram:  The right subclavian vein is very small or may be occluded.  Small veins along the course of the right subclavian vein could represent collaterals.  There is a large collateral vein that drains into the neck.  The SVC is patent.  PICC line tip in the lower SVC.  Complications: None  Impression:Successful placement of a left arm PICC line with ultrasound and fluoroscopic guidance.  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  The right upper extremity drains through a large collateral vessel in the right neck.  The SVC is patent.  The patient is not a candidate for future PICC lines on the right side and would avoid the central venous access through the right subclavian vein.  Original Report Authenticated By: Richarda Overlie, M.D.   Dg Ercp With Sphincterotomy  10/06/2011  *RADIOLOGY REPORT*  Clinical Data: Pancreatitis  ERCP  Comparison:  MRI 10/05/2011, CT 09/27/2011  Technique:  Multiple spot images obtained with the fluoroscopic device and submitted for interpretation post-procedure.  ERCP was performed by Dr. Elnoria Howard.  Findings: Digital C-arm images were obtained.  Contrast is injected in the common bile duct.  The common bile duct is moderately dilated.  There are filling defects in the upper common bile duct which could be due to stones,  blood or sludge.  Sphincterotomy was performed.  There is increased density overlying the common bile duct  but outside of the duct.  This may be contrast in the bowel from prior CT.  Less likely is extravasated contrast from injection.  This is present at the beginning of the study did not change throughout the study.  IMPRESSION: Dilated common bile duct with filling defects in the common hepatic duct which could be due to stones, sludge, or blood.  Sphincterotomy performed.  These images were submitted for radiologic interpretation only. Please see the procedural report for the amount of contrast and the fluoroscopy time utilized.  Original Report Authenticated By: Camelia Phenes, M.D.    Medications:  Scheduled:   . antiseptic oral rinse  15 mL Mouth Rinse q12n4p  . folic acid  1 mg Intravenous Daily  . HYDROmorphone PCA 0.3 mg/mL   Intravenous Q4H  . imipenem-cilastatin  500 mg Intravenous Q8H  . insulin aspart  0-9 Units Subcutaneous Q4H  . LORazepam  0-4 mg Intravenous Q6H   Followed by  .  LORazepam  0-4 mg Intravenous Q12H  . pantoprazole (PROTONIX) IV  40 mg Intravenous Q24H  . thiamine  100 mg Oral Daily   Or  . thiamine  100 mg Intravenous Daily   Continuous:   . TPN (CLINIMIX) +/- additives 40 mL/hr at 10/07/11 1805   And  . fat emulsion 240 mL (10/07/11 1805)  . TPN (CLINIMIX) +/- additives      Assessment/Plan: 1) ETOH pancreatitis. 2) Hemorrhagic pseudocyst. 3) Hemobilia.   She is stable at this time.  No change with her HGB.  Pain is under better control with the PCA.  Plan: 1) Okay to advance to a liquid diet.  If she has pain it will be discontinued. 2) Continue with PCA and supportive care. 3) Monitor for signs/symptoms of ETOH withdrawal.   LOS: 3 days   Harles Evetts D 10/08/2011, 2:25 PM

## 2011-10-08 NOTE — Progress Notes (Signed)
PARENTERAL NUTRITION CONSULT NOTE - Follow Up  Pharmacy Consult for  TNA Indication: pancreatitis  Allergies  Allergen Reactions  . Ciprofloxacin     REACTION: nausea    Patient Measurements: Height: 5\' 7"  (170.2 cm) Weight: 153 lb 11.2 oz (69.718 kg) IBW/kg (Calculated) : 61.6    Vital Signs: Temp: 98.4 F (36.9 C) (06/20 0445) Temp src: Oral (06/20 0445) BP: 127/68 mmHg (06/20 0445) Pulse Rate: 76  (06/20 0445) Intake/Output from previous day: 06/19 0701 - 06/20 0700 In: 302 [IV Piggyback:302] Out: 1 [Urine:1] Intake/Output from this shift:    Labs:  [facility] 10/08/11 0320 10/06/11 0349  WBC 7.7 7.0  HGB 11.5* 11.6*  HCT 35.8* 36.6  PLT 294 335  APTT -- 30  INR -- 0.97     Basename 10/08/11 0320 10/06/11 0349  NA 133* 136  K 3.5 3.8  CL 96 101  CO2 27 25  GLUCOSE 173* 83  BUN 6 8  CREATININE 0.57 0.70  LABCREA -- --  CREAT24HRUR -- --  CALCIUM 9.4 9.0  MG 1.8 --  PHOS 3.6 --  PROT 6.8 6.4  ALBUMIN 3.0* 2.9*  AST 112* 144*  ALT 81* 85*  ALKPHOS 242* 201*  BILITOT 3.4* 5.4*  BILIDIR -- --  IBILI -- --  PREALBUMIN -- --  TRIG -- --  CHOLHDL -- --  CHOL -- --   Estimated Creatinine Clearance: 76.4 ml/min (by C-G formula based on Cr of 0.57).   No results found for this basename: GLUCAP:3 in the last 72 hours  Medical History: Past Medical History  Diagnosis Date  . GERD (gastroesophageal reflux disease)   . Rosacea   . Elevated liver function tests   . Wears glasses   . Chronic diarrhea   . Pancreatitis 01/2010    2011 admission was second admission, 3rd in 02/2011  . Alcohol abuse     H/o withdrawal   . PONV (postoperative nausea and vomiting)     Medications:  Scheduled:     . antiseptic oral rinse  15 mL Mouth Rinse q12n4p  . folic acid  1 mg Intravenous Daily  . HYDROmorphone PCA 0.3 mg/mL   Intravenous Q4H  . imipenem-cilastatin  500 mg Intravenous Q8H  . LORazepam  0-4 mg Intravenous Q6H   Followed by  . LORazepam   0-4 mg Intravenous Q12H  . pantoprazole (PROTONIX) IV  40 mg Intravenous Q24H  . thiamine  100 mg Oral Daily   Or  . thiamine  100 mg Intravenous Daily  . DISCONTD: enoxaparin  40 mg Subcutaneous Q24H  . DISCONTD: folic acid (FOLVITE) IVPB  1 mg Intravenous Daily   Infusions:     . TPN (CLINIMIX) +/- additives 40 mL/hr at 10/07/11 1805   And  . fat emulsion 240 mL (10/07/11 1805)   PRN: diphenhydrAMINE, diphenhydrAMINE, iohexol, LORazepam, LORazepam, LORazepam, LORazepam, naloxone, ondansetron (ZOFRAN) IV, oxyCODONE, sodium chloride, DISCONTD:  HYDROmorphone (DILAUDID) injection, DISCONTD: metoCLOPramide, DISCONTD: ondansetron (ZOFRAN) IV, DISCONTD: ondansetron, DISCONTD: zolpidem  Insulin Requirements in the past 24 hours:  None (Not currently on SSI)  Current Nutrition:  NPO No MIVF  Assessment: 55 YOF w/ acute ETOH pancreatitis, TNA started last night   Glucose elevated  Na low, may be fluid related   Elevated LFT, likely disease related  Nutritional Goals:  Kcal:1775-2050  Protein:105-125g Clinimix E 5/20% @ 37ml/hr + Lipids 20% 35ml/hr (MWF only) = 96g Protein and avg 1896 kcal/day  Plan:   Increase Clinimix E 5/20% to  64ml/hr  SSI q4h  Lipids/MVI and TE MWF only d/t shortage  TNA lab panel Mon and Faye Ramsay PharmD  [phone] 10/08/2011 8:32 AM

## 2011-10-08 NOTE — Progress Notes (Signed)
PROGRESS NOTE  [name]:[id number] DOB: 03/29/1956 DOA: 10/05/2011 PCP: Thayer Headings, MD  Brief narrative: [name] is a 56 year old female with past medical history of alcohol abuse and prior history of pancreatitis and pancreatic pseudocyst in the head of the who was admitted on 10/05/2011 with recurrent pancreatitis. Because of elevated liver function tests, a GI consultation was requested and an MRCP was done 10/06/2011 which showed dilated common bile duct with biliary sludge. She subsequently underwent an ERCP with sphincterotomy on 10/06/2011. There were clots and a small stone noted in her common bile duct, which were cleared during the procedure. The bleeding was felt to be due to a communication between the pseudocyst and the common bile duct.  Interim History: [name] was stable overnight.  Nursing staff reports that the patient has not been consistently using her PCA and that her pain has not been controlled.  RN also reports that she was disoriented and confused after receiving Ambien last night.   Assessment/Plan: Principal Problem:  *Pancreatitis caused by a combination of alcoholism, obstruction of the common bile duct by blood clots and a small stone, and extrinsic compression of the common bile duct by pancreatic pseudocyst  Status post MRCP/ERCP with sphincterotomy.  Placed on empiric antibiotics do to a hemorrhagic pseudocyst with communication with the common bile duct.  PICC line placed and started TPN 10/07/11.  Maintain n.p.o. status otherwise.  PCA started for pain control.  The patient wishes to continue with this method of pain medicine delivery.  Lipase normalized. Active Problems:  Hyponatremia  Monitor and check TSH.  Alcohol abuse  Placed on detox IV Ativan protocol per [facility].  No active DTs.  Social worker consultation for help with post discharge alcohol counseling.  Elevated liver function tests  Secondary common bile duct  obstruction status post ERCP with sphincterotomy.   LFTs declining.  GERD (gastroesophageal reflux disease)  Continue IV PPI therapy.  Acute blood loss anemia  Hemoglobin stable after an initial drop.   Code Status: Full Code Family Communication: None at bedside. Disposition Plan: Home, when stable.  Medical Consultants:  Dr. Luisa Hart hung, Gastroenterology  Other consultants:  Pharmacy  Antibiotics:  Primaxin 10/06/2011--->  Unasyn 10/06/1998--->10/06/11  Rocephin 10/05/2011---> 10/05/2011   Subjective  [name] continues to report abdominal pain, bloating and nausea.  No vomiting.  No tremulousness.   Objective   Objective: Filed Vitals:   10/08/11 0327 10/08/11 0344 10/08/11 0353 10/08/11 0445  BP:    127/68  Pulse: 72   76  Temp:    98.4 F (36.9 C)  TempSrc:    Oral  Resp:  16 16 15   Height:      Weight:      SpO2:  94% 95% 94%    Intake/Output Summary (Last 24 hours) at 10/08/11 0804 Last data filed at 10/07/11 1805  Gross per 24 hour  Intake    302 ml  Output      1 ml  Net    301 ml    Exam: Gen:  NAD Cardiovascular:  RRR, No M/R/G Respiratory: Lungs CTAB Gastrointestinal: Abdomen soft, NT/ND with normal active bowel sounds. Extremities: No C/E/C  Data Reviewed: Basic Metabolic Panel:  Lab 10/08/11 8119 10/06/11 0349 10/05/11 0305  NA 133* 136 135  K 3.5 3.8 --  CL 96 101 95*  CO2 27 25 25   GLUCOSE 173* 83 133*  BUN 6 8 11   CREATININE 0.57 0.70 0.54  CALCIUM 9.4 9.0 9.9  MG  1.8 -- --  PHOS 3.6 -- --   GFR Estimated Creatinine Clearance: 76.4 ml/min (by C-G formula based on Cr of 0.57). Liver Function Tests:  Lab 10/08/11 0320 10/06/11 0349 10/05/11 0305  AST 112* 144* 157*  ALT 81* 85* 81*  ALKPHOS 242* 201* 228*  BILITOT 3.4* 5.4* 3.7*  PROT 6.8 6.4 7.9  ALBUMIN 3.0* 2.9* 3.6    Lab 10/06/11 0340 10/05/11 0305  LIPASE 40 270*  AMYLASE -- 226*   Coagulation profile  Lab 10/06/11 0349  INR 0.97  PROTIME --     CBC:  Lab 10/08/11 0320 10/06/11 0349 10/05/11 0305  WBC 7.7 7.0 14.0*  NEUTROABS 5.5 -- 11.5*  HGB 11.5* 11.6* 14.2  HCT 35.8* 36.6 41.9  MCV 94.5 96.3 92.3  PLT 294 335 485*   Microbiology Recent Results (from the past 240 hour(s))  URINE CULTURE     Status: Normal   Collection Time   10/05/11  3:36 AM      Component Value Range Status Comment   Specimen Description URINE, CLEAN CATCH   Final    Special Requests Normal   Final    Culture  Setup Time [id number]   Final    Colony Count 60,000 COLONIES/ML   Final    Culture     Final    Value: Multiple bacterial morphotypes present, none predominant. Suggest appropriate recollection if clinically indicated.   Report Status 10/06/2011 FINAL   Final     Procedures and Diagnostic Studies:  US Abdomen Complete 10/05/2011 IMPRESSION:  1.  Intra and extrahepatic biliary ductal dilatation.  Sludge is identified within the common bile duct.  No ductal stone is seen. 2.  Status post cholecystectomy. 3.  Low attenuation collection in the pancreatic head is seen as on the most recent CT scans compatible with a pseudocyst.  Original Report Authenticated By: Bernadene Bell. D'ALESSIO, M.D.    Mr 3d Recon At Scanner 10/06/2011 IMPRESSION: 1.  Severe dilatation of intrahepatic biliary tree and common bile duct which abruptly tapers to a more normal caliber as the distal common bile duct passes adjacent to a complex pancreatic head pseudocyst.  No distal ductal stone is identified.  However, there is a large amount of biliary sludge in the distal common bile duct. 2.  The main pancreatic duct is only mildly dilated, measuring 4 mm.  However, as this duct extends into the head of the pancreas it is not visualized once it comes in proximity to the pancreatic head pseudocyst.  This could suggest significant mass effect upon the duct, or communication of the duct with this pseudocyst.  This pancreatic head pseudocyst is highly complex with internal debris  (no evidence of internal enhancement to suggest neoplasm at this time). 3.  Smaller simple pseudocysts in the neck of the pancreas, as above. 4.  Additional incidental findings, as above.  Original Report Authenticated By: Florencia Reasons, M.D.    Dg Ercp With Sphincterotomy 10/06/2011  IMPRESSION: Dilated common bile duct with filling defects in the common hepatic duct which could be due to stones, sludge, or blood.  Sphincterotomy performed.  These images were submitted for radiologic interpretation only. Please see the procedural report for the amount of contrast and the fluoroscopy time utilized.  Original Report Authenticated By: Camelia Phenes, M.D.    Mr Abd W/wo Cm/mrcp 10/06/2011 IMPRESSION: 1.  Severe dilatation of intrahepatic biliary tree and common bile duct which abruptly tapers to a more normal caliber as the distal  common bile duct passes adjacent to a complex pancreatic head pseudocyst.  No distal ductal stone is identified.  However, there is a large amount of biliary sludge in the distal common bile duct. 2.  The main pancreatic duct is only mildly dilated, measuring 4 mm.  However, as this duct extends into the head of the pancreas it is not visualized once it comes in proximity to the pancreatic head pseudocyst.  This could suggest significant mass effect upon the duct, or communication of the duct with this pseudocyst.  This pancreatic head pseudocyst is highly complex with internal debris (no evidence of internal enhancement to suggest neoplasm at this time). 3.  Smaller simple pseudocysts in the neck of the pancreas, as above. 4.  Additional incidental findings, as above.  Original Report Authenticated By: Florencia Reasons, M.D.    Scheduled Meds:    . antiseptic oral rinse  15 mL Mouth Rinse q12n4p  . folic acid  1 mg Intravenous Daily  . HYDROmorphone PCA 0.3 mg/mL   Intravenous Q4H  . imipenem-cilastatin  500 mg Intravenous Q8H  . LORazepam  0-4 mg Intravenous Q6H    Followed by  . LORazepam  0-4 mg Intravenous Q12H  . pantoprazole (PROTONIX) IV  40 mg Intravenous Q24H  . thiamine  100 mg Oral Daily   Or  . thiamine  100 mg Intravenous Daily  . DISCONTD: enoxaparin  40 mg Subcutaneous Q24H  . DISCONTD: folic acid (FOLVITE) IVPB  1 mg Intravenous Daily   Continuous Infusions:    . TPN (CLINIMIX) +/- additives 40 mL/hr at 10/07/11 1805   And  . fat emulsion 240 mL (10/07/11 1805)      LOS: 3 days   Hillery Aldo, MD Pager [phone]  10/08/2011, 8:04 AM

## 2011-10-08 NOTE — Progress Notes (Signed)
Pt called for nurse at 0300 and reported feeling very confused. She did not know where she was. Nurse reoriented pt and helped her tot he bathroom once she stated she was feeling better. Pt did receive an ambien around 2250 on 6/19 which is liekly the cause of her delusions and disorientation. Will continue tot monitor. Eugene Garnet [facility] 10/08/2011 3:26 AM

## 2011-10-09 DIAGNOSIS — F1010 Alcohol abuse, uncomplicated: Secondary | ICD-10-CM

## 2011-10-09 DIAGNOSIS — K859 Acute pancreatitis without necrosis or infection, unspecified: Secondary | ICD-10-CM

## 2011-10-09 DIAGNOSIS — R740 Nonspecific elevation of levels of transaminase and lactic acid dehydrogenase [LDH]: Secondary | ICD-10-CM

## 2011-10-09 LAB — GLUCOSE, CAPILLARY
Glucose-Capillary: 118 mg/dL — ABNORMAL HIGH (ref 70–99)
Glucose-Capillary: 179 mg/dL — ABNORMAL HIGH (ref 70–99)
Glucose-Capillary: 184 mg/dL — ABNORMAL HIGH (ref 70–99)

## 2011-10-09 LAB — COMPREHENSIVE METABOLIC PANEL
ALT: 62 U/L — ABNORMAL HIGH (ref 0–35)
Calcium: 9.3 mg/dL (ref 8.4–10.5)
GFR calc Af Amer: >90 mL/min (ref >90)
Glucose, Bld: 124 mg/dL — ABNORMAL HIGH (ref 70–99)
Sodium: 139 mEq/L (ref 135–145)
Total Protein: 6.8 g/dL (ref 6.0–8.3)

## 2011-10-09 LAB — CBC
HCT: 36.8 % (ref 36.0–46.0)
Hemoglobin: 11.7 g/dL — ABNORMAL LOW (ref 12.0–15.0)
MCHC: 31.8 g/dL (ref 30.0–36.0)
WBC: 8.1 10*3/uL (ref 4.0–10.5)

## 2011-10-09 LAB — TSH: TSH: 2.997 u[IU]/mL (ref 0.350–4.500)

## 2011-10-09 MED ORDER — METOCLOPRAMIDE HCL 5 MG/ML IJ SOLN
5.0000 mg | Freq: Four times a day (QID) | INTRAMUSCULAR | Status: DC | PRN
  Administered 2011-10-09 – 2011-10-12 (×8): 5 mg via INTRAVENOUS
  Filled 2011-10-09 (×8): qty 2

## 2011-10-09 MED ORDER — SODIUM CHLORIDE 0.9 % IV SOLN
Freq: Once | INTRAVENOUS | Status: AC
  Administered 2011-10-09: 500 mL via INTRAVENOUS

## 2011-10-09 MED ORDER — IMIPENEM-CILASTATIN 500 MG IV SOLR
500.0000 mg | Freq: Four times a day (QID) | INTRAVENOUS | Status: DC
  Administered 2011-10-09 – 2011-10-12 (×12): 500 mg via INTRAVENOUS
  Filled 2011-10-09 (×13): qty 500

## 2011-10-09 MED ORDER — FAT EMULSION 20 % IV EMUL
240.0000 mL | INTRAVENOUS | Status: AC
  Administered 2011-10-09: 240 mL via INTRAVENOUS
  Filled 2011-10-09: qty 250

## 2011-10-09 MED ORDER — ZINC TRACE METAL 1 MG/ML IV SOLN
INTRAVENOUS | Status: AC
  Administered 2011-10-09: 18:00:00 via INTRAVENOUS
  Filled 2011-10-09: qty 2000

## 2011-10-09 MED ORDER — ZINC TRACE METAL 1 MG/ML IV SOLN
INTRAVENOUS | Status: DC
  Filled 2011-10-09: qty 2000

## 2011-10-09 MED ORDER — INSULIN ASPART 100 UNIT/ML ~~LOC~~ SOLN
0.0000 [IU] | SUBCUTANEOUS | Status: DC
  Administered 2011-10-09: 3 [IU] via SUBCUTANEOUS
  Administered 2011-10-09: 1 [IU] via SUBCUTANEOUS
  Administered 2011-10-10: 2 [IU] via SUBCUTANEOUS
  Administered 2011-10-10: 3 [IU] via SUBCUTANEOUS
  Administered 2011-10-10: 2 [IU] via SUBCUTANEOUS
  Administered 2011-10-10: 1 [IU] via SUBCUTANEOUS
  Administered 2011-10-11: 2 [IU] via SUBCUTANEOUS
  Administered 2011-10-11: 1 [IU] via SUBCUTANEOUS
  Administered 2011-10-11 (×2): 2 [IU] via SUBCUTANEOUS
  Administered 2011-10-11: 3 [IU] via SUBCUTANEOUS
  Administered 2011-10-11 – 2011-10-12 (×3): 2 [IU] via SUBCUTANEOUS

## 2011-10-09 MED ORDER — METOCLOPRAMIDE HCL 5 MG/ML IJ SOLN
5.0000 mg | Freq: Once | INTRAMUSCULAR | Status: AC
  Administered 2011-10-09: 5 mg via INTRAVENOUS
  Filled 2011-10-09: qty 2

## 2011-10-09 MED ORDER — CLINIMIX E/DEXTROSE (5/20) 5 % IV SOLN
INTRAVENOUS | Status: DC
  Filled 2011-10-09: qty 2000

## 2011-10-09 NOTE — Progress Notes (Signed)
UR done. 

## 2011-10-09 NOTE — Progress Notes (Signed)
PROGRESS NOTE  [name]:[id number] DOB: 1955/10/19 DOA: 10/05/2011 PCP: Thayer Headings, MD  Brief narrative: [name] is a 56 year old female with past medical history of alcohol abuse and prior history of pancreatitis and pancreatic pseudocyst in the head of the who was admitted on 10/05/2011 with recurrent pancreatitis. Because of elevated liver function tests, a GI consultation was requested and an MRCP was done 10/06/2011 which showed dilated common bile duct with biliary sludge. She subsequently underwent an ERCP with sphincterotomy on 10/06/2011. There were clots and a small stone noted in her common bile duct, which were cleared during the procedure. The bleeding was felt to be due to a communication between the pseudocyst and the common bile duct.  Interim History: [name] was stable overnight. Given a dose of Reglan last night for breakthrough nausea.   Assessment/Plan: Principal Problem:  *Pancreatitis caused by a combination of alcoholism, obstruction of the common bile duct by blood clots and a small stone, and extrinsic compression of the common bile duct by pancreatic pseudocyst  Status post MRCP/ERCP with sphincterotomy.  Placed on empiric antibiotics do to a hemorrhagic pseudocyst with communication with the common bile duct.  PICC line placed and started TPN 10/07/11.  Started on CL diet 10/08/11.  Would not advance further given ongoing issues with nausea and abdominal pain.  Continue PCA for pain control.    Lipase normalized. Active Problems:  Hyponatremia  TSH WNL.  Resolved.  Alcohol abuse  Placed on detox IV Ativan protocol per [facility].  No active DTs.  Social worker consultation for help with post discharge alcohol counseling.  Elevated liver function tests  Secondary common bile duct obstruction status post ERCP with sphincterotomy.   LFTs declining.  GERD (gastroesophageal reflux disease)  Continue IV PPI therapy.  Acute blood  loss anemia  Hemoglobin stable after an initial drop.   Code Status: Full Code Family Communication: None at bedside. Disposition Plan: Home, when stable.  Medical Consultants:  Dr. Luisa Hart hung, Gastroenterology  Other consultants:  Pharmacy  Antibiotics:  Primaxin 10/06/2011--->  Unasyn 10/06/1998--->10/06/11  Rocephin 10/05/2011---> 10/05/2011   Subjective  [name] continues to report abdominal pain, bloating and nausea.  No vomiting.  No tremulousness.  Tolerating clear liquids but does report some nausea and abdominal pain post prandially.   Objective   Objective: Filed Vitals:   10/09/11 0040 10/09/11 0430 10/09/11 0621 10/09/11 0921  BP:   101/55   Pulse: 78 60 73   Temp:   97.8 F (36.6 C)   TempSrc:   Oral   Resp: 10 11 10 12   Height:      Weight:      SpO2: 98% 94% 95%     Intake/Output Summary (Last 24 hours) at 10/09/11 8119 Last data filed at 10/09/11 1478  Gross per 24 hour  Intake 2877.67 ml  Output   1200 ml  Net 1677.67 ml    Exam: Gen:  NAD Cardiovascular:  RRR, No M/R/G Respiratory: Lungs CTAB Gastrointestinal: Abdomen soft, NT/ND with normal active bowel sounds. Extremities: No C/E/C  Data Reviewed: Basic Metabolic Panel:  Lab 10/09/11 2956 10/08/11 0320 10/06/11 0349 10/05/11 0305  NA 139 133* 136 135  K 3.5 3.5 -- --  CL 103 96 101 95*  CO2 29 27 25 25   GLUCOSE 124* 173* 83 133*  BUN 8 6 8 11   CREATININE 0.54 0.57 0.70 0.54  CALCIUM 9.3 9.4 9.0 9.9  MG -- 1.8 -- --  PHOS -- 3.6 -- --  GFR Estimated Creatinine Clearance: 76.4 ml/min (by C-G formula based on Cr of 0.54). Liver Function Tests:  Lab 10/09/11 0607 10/08/11 0320 10/06/11 0349 10/05/11 0305  AST 55* 112* 144* 157*  ALT 62* 81* 85* 81*  ALKPHOS 223* 242* 201* 228*  BILITOT 1.5* 3.4* 5.4* 3.7*  PROT 6.8 6.8 6.4 7.9  ALBUMIN 2.8* 3.0* 2.9* 3.6    Lab 10/06/11 0340 10/05/11 0305  LIPASE 40 270*  AMYLASE -- 226*   Coagulation profile  Lab  10/06/11 0349  INR 0.97  PROTIME --    CBC:  Lab 10/09/11 0607 10/08/11 0320 10/06/11 0349 10/05/11 0305  WBC 8.1 7.7 7.0 14.0*  NEUTROABS -- 5.5 -- 11.5*  HGB 11.7* 11.5* 11.6* 14.2  HCT 36.8 35.8* 36.6 41.9  MCV 95.8 94.5 96.3 92.3  PLT 271 294 335 485*   TSH:  Ref. Range 09/15/2009 22:50  TSH Latest Range: 0.350-4.500 uIU/mL 2.727 Test methodology is 3rd generation TSH   Microbiology Recent Results (from the past 240 hour(s))  URINE CULTURE     Status: Normal   Collection Time   10/05/11  3:36 AM      Component Value Range Status Comment   Specimen Description URINE, CLEAN CATCH   Final    Special Requests Normal   Final    Culture  Setup Time [id number]   Final    Colony Count 60,000 COLONIES/ML   Final    Culture     Final    Value: Multiple bacterial morphotypes present, none predominant. Suggest appropriate recollection if clinically indicated.   Report Status 10/06/2011 FINAL   Final     Procedures and Diagnostic Studies:  US Abdomen Complete 10/05/2011 IMPRESSION:  1.  Intra and extrahepatic biliary ductal dilatation.  Sludge is identified within the common bile duct.  No ductal stone is seen. 2.  Status post cholecystectomy. 3.  Low attenuation collection in the pancreatic head is seen as on the most recent CT scans compatible with a pseudocyst.  Original Report Authenticated By: Bernadene Bell. D'ALESSIO, M.D.    Mr 3d Recon At Scanner 10/06/2011 IMPRESSION: 1.  Severe dilatation of intrahepatic biliary tree and common bile duct which abruptly tapers to a more normal caliber as the distal common bile duct passes adjacent to a complex pancreatic head pseudocyst.  No distal ductal stone is identified.  However, there is a large amount of biliary sludge in the distal common bile duct. 2.  The main pancreatic duct is only mildly dilated, measuring 4 mm.  However, as this duct extends into the head of the pancreas it is not visualized once it comes in proximity to the pancreatic  head pseudocyst.  This could suggest significant mass effect upon the duct, or communication of the duct with this pseudocyst.  This pancreatic head pseudocyst is highly complex with internal debris (no evidence of internal enhancement to suggest neoplasm at this time). 3.  Smaller simple pseudocysts in the neck of the pancreas, as above. 4.  Additional incidental findings, as above.  Original Report Authenticated By: Florencia Reasons, M.D.    Dg Ercp With Sphincterotomy 10/06/2011  IMPRESSION: Dilated common bile duct with filling defects in the common hepatic duct which could be due to stones, sludge, or blood.  Sphincterotomy performed.  These images were submitted for radiologic interpretation only. Please see the procedural report for the amount of contrast and the fluoroscopy time utilized.  Original Report Authenticated By: Camelia Phenes, M.D.    Mr Roe Coombs  W/wo Cm/mrcp 10/06/2011 IMPRESSION: 1.  Severe dilatation of intrahepatic biliary tree and common bile duct which abruptly tapers to a more normal caliber as the distal common bile duct passes adjacent to a complex pancreatic head pseudocyst.  No distal ductal stone is identified.  However, there is a large amount of biliary sludge in the distal common bile duct. 2.  The main pancreatic duct is only mildly dilated, measuring 4 mm.  However, as this duct extends into the head of the pancreas it is not visualized once it comes in proximity to the pancreatic head pseudocyst.  This could suggest significant mass effect upon the duct, or communication of the duct with this pseudocyst.  This pancreatic head pseudocyst is highly complex with internal debris (no evidence of internal enhancement to suggest neoplasm at this time). 3.  Smaller simple pseudocysts in the neck of the pancreas, as above. 4.  Additional incidental findings, as above.  Original Report Authenticated By: Florencia Reasons, M.D.    Scheduled Meds:    . sodium chloride   Intravenous  Once  . antiseptic oral rinse  15 mL Mouth Rinse q12n4p  . folic acid  1 mg Intravenous Daily  . HYDROmorphone PCA 0.3 mg/mL   Intravenous Q4H  . imipenem-cilastatin  500 mg Intravenous Q6H  . insulin aspart  0-15 Units Subcutaneous Q4H  . LORazepam  0-4 mg Intravenous Q6H   Followed by  . LORazepam  0-4 mg Intravenous Q12H  . metoCLOPramide (REGLAN) injection  5 mg Intravenous Once  . pantoprazole (PROTONIX) IV  40 mg Intravenous Q12H  . thiamine  100 mg Oral Daily   Or  . thiamine  100 mg Intravenous Daily  . DISCONTD: imipenem-cilastatin  500 mg Intravenous Q8H  . DISCONTD: insulin aspart  0-9 Units Subcutaneous Q4H  . DISCONTD: pantoprazole (PROTONIX) IV  40 mg Intravenous Q24H   Continuous Infusions:    . TPN (CLINIMIX) +/- additives 40 mL/hr at 10/07/11 1805   And  . fat emulsion 240 mL (10/07/11 1805)  . fat emulsion    . TPN (CLINIMIX) +/- additives 60 mL/hr at 10/08/11 1838  . TPN (CLINIMIX) +/- additives    . DISCONTD: TPN (CLINIMIX) +/- additives        LOS: 4 days   Hillery Aldo, MD Pager [phone]  10/09/2011, 9:27 AM

## 2011-10-09 NOTE — Progress Notes (Addendum)
PARENTERAL NUTRITION CONSULT NOTE - Follow Up  Pharmacy Consult for  TNA Indication: pancreatitis  Allergies  Allergen Reactions  . Ciprofloxacin     REACTION: nausea    Patient Measurements: Height: 5\' 7"  (170.2 cm) Weight: 153 lb 11.2 oz (69.718 kg) IBW/kg (Calculated) : 61.6    Vital Signs: Temp: 97.8 F (36.6 C) (06/21 0621) Temp src: Oral (06/21 0621) BP: 101/55 mmHg (06/21 0621) Pulse Rate: 73  (06/21 0621) Intake/Output from previous day: 06/20 0701 - 06/21 0700 In: 2877.7 [P.O.:550; I.V.:61.9; IV Piggyback:202; TPN:689.8] Out: 1200 [Urine:1200] Intake/Output from this shift:    Labs:  [facility] 10/09/11 0607 10/08/11 0320  WBC 8.1 7.7  HGB 11.7* 11.5*  HCT 36.8 35.8*  PLT 271 294  APTT -- --  INR -- --     [name] 10/09/11 0607 10/08/11 0320  NA 139 133*  K 3.5 3.5  CL 103 96  CO2 29 27  GLUCOSE 124* 173*  BUN 8 6  CREATININE 0.54 0.57  LABCREA -- --  CREAT24HRUR -- --  CALCIUM 9.3 9.4  MG -- 1.8  PHOS -- 3.6  PROT 6.8 6.8  ALBUMIN 2.8* 3.0*  AST 55* 112*  ALT 62* 81*  ALKPHOS 223* 242*  BILITOT 1.5* 3.4*  BILIDIR -- --  IBILI -- --  PREALBUMIN -- 18.1  TRIG -- 196*  CHOLHDL -- --  CHOL -- 211*   Estimated Creatinine Clearance: 76.4 ml/min (by C-G formula based on Cr of 0.54).    Basename 10/09/11 0456 10/09/11 0014 10/08/11 2025  GLUCAP 160* 127* 184*    Medical History: Past Medical History  Diagnosis Date  . GERD (gastroesophageal reflux disease)   . Rosacea   . Elevated liver function tests   . Wears glasses   . Chronic diarrhea   . Pancreatitis 01/2010    2011 admission was second admission, 3rd in 02/2011  . Alcohol abuse     H/o withdrawal   . PONV (postoperative nausea and vomiting)     Medications:  Scheduled:     . sodium chloride   Intravenous Once  . antiseptic oral rinse  15 mL Mouth Rinse q12n4p  . folic acid  1 mg Intravenous Daily  . HYDROmorphone PCA 0.3 mg/mL   Intravenous Q4H  .  imipenem-cilastatin  500 mg Intravenous Q8H  . insulin aspart  0-9 Units Subcutaneous Q4H  . LORazepam  0-4 mg Intravenous Q6H   Followed by  . LORazepam  0-4 mg Intravenous Q12H  . metoCLOPramide (REGLAN) injection  5 mg Intravenous Once  . pantoprazole (PROTONIX) IV  40 mg Intravenous Q12H  . thiamine  100 mg Oral Daily   Or  . thiamine  100 mg Intravenous Daily  . DISCONTD: pantoprazole (PROTONIX) IV  40 mg Intravenous Q24H   Infusions:     . TPN (CLINIMIX) +/- additives 40 mL/hr at 10/07/11 1805   And  . fat emulsion 240 mL (10/07/11 1805)  . TPN (CLINIMIX) +/- additives 60 mL/hr at 10/08/11 1838   PRN: diphenhydrAMINE, diphenhydrAMINE, LORazepam, LORazepam, LORazepam, LORazepam, naloxone, ondansetron (ZOFRAN) IV, oxyCODONE, sodium chloride, DISCONTD: zolpidem  Insulin Requirements in the past 24 hours:  6 units SSI since start of SSI yesterday (CBG 139 136 105 184 127 160)  Current Nutrition:  Advanced to CL diet last night, 75% dinner tray No MIVF  Assessment: 55 YOF w/ acute ETOH pancreatitis, TNA started 6/19  CBG still elevated on SSI sensitive scale  Lytes wnl  Elevated LFT, likely  disease related  Nutritional Goals:  Kcal:1775-2050  Protein:105-125g Clinimix E 5/20% @ 32ml/hr + Lipids 20% 27ml/hr (MWF only) = 96g Protein and avg 1896 kcal/day  Plan:   Increase Clinimix E 5/20% to 80 ml/hr (goal rate)  Await further advancement in diet/toleration to wean TNA  SSI q4h - increase to moderate schale  Lipids/MVI and TE MWF only d/t shortage  TNA lab panel Mon and Faye Ramsay PharmD  [phone] 10/09/2011 7:21 AM

## 2011-10-09 NOTE — Progress Notes (Signed)
Subjective: Feeling better.  Some nausea with clear liquids.  PCA is beneficial.  Objective: Vital signs in last 24 hours: Temp:  [97.4 F (36.3 C)-98.4 F (36.9 C)] 97.8 F (36.6 C) (06/21 0621) Pulse Rate:  [60-82] 73  (06/21 0621) Resp:  [10-18] 12  (06/21 0921) BP: (101-119)/(55-80) 101/55 mmHg (06/21 0621) SpO2:  [93 %-98 %] 95 % (06/21 0621) FiO2 (%):  [28 %-94 %] 94 % (06/21 0921) Last BM Date: 10/04/11  Intake/Output from previous day: 06/20 0701 - 06/21 0700 In: 2877.7 [P.O.:550; I.V.:61.9; IV Piggyback:202; TPN:689.8] Out: 1200 [Urine:1200] Intake/Output this shift: Total I/O In: 240 [P.O.:240] Out: 300 [Urine:300]  General appearance: alert and no distress GI: soft, non-tender; bowel sounds normal; no masses,  no organomegaly  Lab Results:  [facility] 10/09/11 0607 10/08/11 0320  WBC 8.1 7.7  HGB 11.7* 11.5*  HCT 36.8 35.8*  PLT 271 294   BMET  Basename 10/09/11 0607 10/08/11 0320  NA 139 133*  K 3.5 3.5  CL 103 96  CO2 29 27  GLUCOSE 124* 173*  BUN 8 6  CREATININE 0.54 0.57  CALCIUM 9.3 9.4   LFT  Basename 10/09/11 0607  PROT 6.8  ALBUMIN 2.8*  AST 55*  ALT 62*  ALKPHOS 223*  BILITOT 1.5*  BILIDIR --  IBILI --   PT/INR No results found for this basename: LABPROT:2,INR:2 in the last 72 hours Hepatitis Panel No results found for this basename: HEPBSAG,HCVAB,HEPAIGM,HEPBIGM in the last 72 hours C-Diff No results found for this basename: CDIFFTOX:3 in the last 72 hours Fecal Lactopherrin No results found for this basename: FECLLACTOFRN in the last 72 hours  Studies/Results: Ir Veno/ext/uni Right  10/07/2011  *RADIOLOGY REPORT*  Clinical history:Pancreatitis and needs IV access.  PROCEDURE(S): RIGHT UPPER EXTREMITY CENTRAL VENOGRAM; PLACEMENT OF LEFT ARM PICC LINE WITH ULTRASOUND AND FLUOROSCOPIC GUIDANCE  Physician: Rachelle Hora. Henn, MD  Medications:None  Moderate sedation time:None  Fluoroscopy time: 3.2 minutes  Procedure:The procedure was  explained to the patient.  The risks and benefits of the procedure were discussed and the patient's questions were addressed.  Informed consent was obtained from the patient.  Ultrasound demonstrated patent right basilic vein.  The right arm was prepped and draped in a sterile fashion.   Maximal barrier sterile technique was utilized including caps, mask, sterile gowns, sterile gloves, sterile drape, hand hygiene and skin antiseptic.   Skin was anesthetized with lidocaine.  A 21 gauge needle was directed into the basilic vein with ultrasound guidance and a wire advanced into the right subclavian region.  A wire would not advance more centrally.  Peel-away sheath was placed.  A right upper extremity venogram was performed.  Wire would not advance into the right innominate vein.  The vascular sheath was removed with manual compression.  Attention was directed to the left arm.  Ultrasound demonstrated a patent left basilic vein.  The left arm was prepped and draped in a sterile fashion.  Maximal barrier sterile technique was utilized including caps, mask, sterile gowns, sterile gloves, sterile drape, hand hygiene and skin antiseptic.  Skin was anesthetized with lidocaine.  21 gauge needle was directed into the basilic vein with ultrasound guidance.  Nitrex wire easily advanced centrally. However, a peel-away sheath would not easily advance into the vein. Contrast was injected through the sheath as it was slowly withdrawn.  The sheath was found to be within a small branch vessel.  As a result, the sheath was directed into a larger vein. A dual  lumen PICC line was cut to 42 cm and easily advanced into the central veins.  Tip of the catheter was placed in the lower SVC.  Findings:Right central venogram:  The right subclavian vein is very small or may be occluded.  Small veins along the course of the right subclavian vein could represent collaterals.  There is a large collateral vein that drains into the neck.  The SVC is  patent.  PICC line tip in the lower SVC.  Complications: None  Impression:Successful placement of a left arm PICC line with ultrasound and fluoroscopic guidance.  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  The right upper extremity drains through a large collateral vessel in the right neck.  The SVC is patent.  The patient is not a candidate for future PICC lines on the right side and would avoid the central venous access through the right subclavian vein.  Original Report Authenticated By: Richarda Overlie, M.D.   Ir Fluoro Guide Cv Line Left  10/07/2011  *RADIOLOGY REPORT*  Clinical history:Pancreatitis and needs IV access.  PROCEDURE(S): RIGHT UPPER EXTREMITY CENTRAL VENOGRAM; PLACEMENT OF LEFT ARM PICC LINE WITH ULTRASOUND AND FLUOROSCOPIC GUIDANCE  Physician: Rachelle Hora. Henn, MD  Medications:None  Moderate sedation time:None  Fluoroscopy time: 3.2 minutes  Procedure:The procedure was explained to the patient.  The risks and benefits of the procedure were discussed and the patient's questions were addressed.  Informed consent was obtained from the patient.  Ultrasound demonstrated patent right basilic vein.  The right arm was prepped and draped in a sterile fashion.   Maximal barrier sterile technique was utilized including caps, mask, sterile gowns, sterile gloves, sterile drape, hand hygiene and skin antiseptic.   Skin was anesthetized with lidocaine.  A 21 gauge needle was directed into the basilic vein with ultrasound guidance and a wire advanced into the right subclavian region.  A wire would not advance more centrally.  Peel-away sheath was placed.  A right upper extremity venogram was performed.  Wire would not advance into the right innominate vein.  The vascular sheath was removed with manual compression.  Attention was directed to the left arm.  Ultrasound demonstrated a patent left basilic vein.  The left arm was prepped and draped in a  sterile fashion.  Maximal barrier sterile technique was utilized including caps, mask, sterile gowns, sterile gloves, sterile drape, hand hygiene and skin antiseptic.  Skin was anesthetized with lidocaine.  21 gauge needle was directed into the basilic vein with ultrasound guidance.  Nitrex wire easily advanced centrally. However, a peel-away sheath would not easily advance into the vein. Contrast was injected through the sheath as it was slowly withdrawn.  The sheath was found to be within a small branch vessel.  As a result, the sheath was directed into a larger vein. A dual lumen PICC line was cut to 42 cm and easily advanced into the central veins.  Tip of the catheter was placed in the lower SVC.  Findings:Right central venogram:  The right subclavian vein is very small or may be occluded.  Small veins along the course of the right subclavian vein could represent collaterals.  There is a large collateral vein that drains into the neck.  The SVC is patent.  PICC line tip in the lower SVC.  Complications: None  Impression:Successful placement of a left arm PICC line with ultrasound and fluoroscopic guidance.  Right upper extremity central venogram demonstrates diffuse stenosis or occlusion  of the right subclavian vein.  The right upper extremity drains through a large collateral vessel in the right neck.  The SVC is patent.  The patient is not a candidate for future PICC lines on the right side and would avoid the central venous access through the right subclavian vein.  Original Report Authenticated By: Richarda Overlie, M.D.   Ir US Guide Vasc Access Left  10/07/2011  *RADIOLOGY REPORT*  Clinical history:Pancreatitis and needs IV access.  PROCEDURE(S): RIGHT UPPER EXTREMITY CENTRAL VENOGRAM; PLACEMENT OF LEFT ARM PICC LINE WITH ULTRASOUND AND FLUOROSCOPIC GUIDANCE  Physician: Rachelle Hora. Henn, MD  Medications:None  Moderate sedation time:None  Fluoroscopy time: 3.2 minutes  Procedure:The procedure was explained to the  patient.  The risks and benefits of the procedure were discussed and the patient's questions were addressed.  Informed consent was obtained from the patient.  Ultrasound demonstrated patent right basilic vein.  The right arm was prepped and draped in a sterile fashion.   Maximal barrier sterile technique was utilized including caps, mask, sterile gowns, sterile gloves, sterile drape, hand hygiene and skin antiseptic.   Skin was anesthetized with lidocaine.  A 21 gauge needle was directed into the basilic vein with ultrasound guidance and a wire advanced into the right subclavian region.  A wire would not advance more centrally.  Peel-away sheath was placed.  A right upper extremity venogram was performed.  Wire would not advance into the right innominate vein.  The vascular sheath was removed with manual compression.  Attention was directed to the left arm.  Ultrasound demonstrated a patent left basilic vein.  The left arm was prepped and draped in a sterile fashion.  Maximal barrier sterile technique was utilized including caps, mask, sterile gowns, sterile gloves, sterile drape, hand hygiene and skin antiseptic.  Skin was anesthetized with lidocaine.  21 gauge needle was directed into the basilic vein with ultrasound guidance.  Nitrex wire easily advanced centrally. However, a peel-away sheath would not easily advance into the vein. Contrast was injected through the sheath as it was slowly withdrawn.  The sheath was found to be within a small branch vessel.  As a result, the sheath was directed into a larger vein. A dual lumen PICC line was cut to 42 cm and easily advanced into the central veins.  Tip of the catheter was placed in the lower SVC.  Findings:Right central venogram:  The right subclavian vein is very small or may be occluded.  Small veins along the course of the right subclavian vein could represent collaterals.  There is a large collateral vein that drains into the neck.  The SVC is patent.  PICC  line tip in the lower SVC.  Complications: None  Impression:Successful placement of a left arm PICC line with ultrasound and fluoroscopic guidance.  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  The right upper extremity drains through a large collateral vessel in the right neck.  The SVC is patent.  The patient is not a candidate for future PICC lines on the right side and would avoid the central venous access through the right subclavian vein.  Original Report Authenticated By: Richarda Overlie, M.D.    Medications:  Scheduled:   . sodium chloride   Intravenous Once  . antiseptic oral rinse  15 mL Mouth Rinse q12n4p  . folic acid  1 mg Intravenous Daily  . HYDROmorphone PCA 0.3 mg/mL   Intravenous Q4H  . imipenem-cilastatin  500 mg Intravenous Q6H  . insulin aspart  0-15 Units Subcutaneous Q4H  . LORazepam  0-4 mg Intravenous Q6H   Followed by  . LORazepam  0-4 mg Intravenous Q12H  . metoCLOPramide (REGLAN) injection  5 mg Intravenous Once  . pantoprazole (PROTONIX) IV  40 mg Intravenous Q12H  . thiamine  100 mg Oral Daily   Or  . thiamine  100 mg Intravenous Daily  . DISCONTD: imipenem-cilastatin  500 mg Intravenous Q8H  . DISCONTD: insulin aspart  0-9 Units Subcutaneous Q4H  . DISCONTD: pantoprazole (PROTONIX) IV  40 mg Intravenous Q24H   Continuous:   . TPN (CLINIMIX) +/- additives 40 mL/hr at 10/07/11 1805   And  . fat emulsion 240 mL (10/07/11 1805)  . fat emulsion    . TPN (CLINIMIX) +/- additives 60 mL/hr at 10/08/11 1838  . TPN (CLINIMIX) +/- additives    . DISCONTD: TPN (CLINIMIX) +/- additives      Assessment/Plan: 1) ETOH pancreatitis with hemorrhagic pseudocyst. 2) Hemobilia secondary to the hemorrhagic pseudocyst.   She continues to improve.  The abdominal pain is less, but she still requires PCA.  Plan: 1) Continue with PCA. 2) Plan on advancing to full liquids tomorrow.  LOS: 4 days    Malina Geers D 10/09/2011, 10:44 AM

## 2011-10-10 DIAGNOSIS — R740 Nonspecific elevation of levels of transaminase and lactic acid dehydrogenase [LDH]: Secondary | ICD-10-CM

## 2011-10-10 DIAGNOSIS — F1010 Alcohol abuse, uncomplicated: Secondary | ICD-10-CM

## 2011-10-10 DIAGNOSIS — K859 Acute pancreatitis without necrosis or infection, unspecified: Secondary | ICD-10-CM

## 2011-10-10 LAB — GLUCOSE, CAPILLARY
Glucose-Capillary: 131 mg/dL — ABNORMAL HIGH (ref 70–99)
Glucose-Capillary: 180 mg/dL — ABNORMAL HIGH (ref 70–99)
Glucose-Capillary: 119 mg/dL — ABNORMAL HIGH (ref 70–99)
Glucose-Capillary: 137 mg/dL — ABNORMAL HIGH (ref 70–99)
Glucose-Capillary: 130 mg/dL — ABNORMAL HIGH (ref 70–99)

## 2011-10-10 MED ORDER — KETOROLAC TROMETHAMINE 30 MG/ML IJ SOLN
30.0000 mg | Freq: Once | INTRAMUSCULAR | Status: AC
  Administered 2011-10-10: 30 mg via INTRAVENOUS
  Filled 2011-10-10: qty 1

## 2011-10-10 MED ORDER — BISACODYL 10 MG RE SUPP
10.0000 mg | Freq: Once | RECTAL | Status: AC
  Administered 2011-10-11: 10 mg via RECTAL
  Filled 2011-10-10: qty 1

## 2011-10-10 MED ORDER — CLINIMIX E/DEXTROSE (5/20) 5 % IV SOLN
INTRAVENOUS | Status: AC
  Administered 2011-10-10: 18:00:00 via INTRAVENOUS
  Filled 2011-10-10: qty 2000

## 2011-10-10 NOTE — Progress Notes (Signed)
Subjective: Patient complains of severe nausea and severe epigastric pain. She tried full liquids at lunch time and has had severe abdominal pain since then along with the nausea.  Objective: Vital signs in last 24 hours: Temp:  [98 F (36.7 C)-98.3 F (36.8 C)] 98 F (36.7 C) (06/22 1455) Pulse Rate:  [63-79] 66  (06/22 1455) Resp:  [9-16] 11  (06/22 1545) BP: (109-121)/(65-72) 121/72 mmHg (06/22 1455) SpO2:  [93 %-99 %] 95 % (06/22 1545) FiO2 (%):  [28 %] 28 % (06/22 0345) Last BM Date: 10/04/11  Intake/Output from previous day: 06/21 0701 - 06/22 0700 In: 3656.6 [P.O.:1230; I.V.:15; IV Piggyback:213; TPN:1311] Out: 4300 [Urine:4300] Intake/Output this shift: Total I/O In: 921.9 [P.O.:360; I.V.:7.9; IV Piggyback:1; TPN:553] Out: 1100 [Urine:1100]  General appearance: alert, cooperative, appears stated age, fatigued and severe distress Resp: clear to auscultation bilaterally Cardio: regular rate and rhythm, S1, S2 normal, no murmur, click, rub or gallop GI: soft with severe epigastric tenderness on palpation with gaurding but without rebound or rigidity; bowel sounds normal; no masses,  no organomegaly Extremities: extremities normal, atraumatic, no cyanosis or edema  Lab Results:  [facility] 10/09/11 0607 10/08/11 0320  WBC 8.1 7.7  HGB 11.7* 11.5*  HCT 36.8 35.8*  PLT 271 294   BMET  Basename 10/09/11 0607 10/08/11 0320  NA 139 133*  K 3.5 3.5  CL 103 96  CO2 29 27  GLUCOSE 124* 173*  BUN 8 6  CREATININE 0.54 0.57  CALCIUM 9.3 9.4   LFT  Basename 10/09/11 0607  PROT 6.8  ALBUMIN 2.8*  AST 55*  ALT 62*  ALKPHOS 223*  BILITOT 1.5*  BILIDIR --  IBILI --   PStudies/Results: No results found.  Medications: I have reviewed the patient's current medications.  Assessment/Plan: Recurrent pancreatitis secondary to alcoholism; history of pancreatic psuedocyst. Patient does not seem to be ready for a diet yet. Continue present care. 2) Acid reflux/hiatal  hernia on PPI's.  3) Esophageal motility disorder.  4) Fatty liver.  LOS: 5 days   Cregg Jutte 10/10/2011, 4:08 PM

## 2011-10-10 NOTE — Progress Notes (Signed)
CSW received referral for services regarding recovery programs for alcoholism.   CSW Assessed. CSW provided referrals for OPT Intensive Recovery Programs.   Pt stated she has participated in [facility] twice before and felt that she never was truly engaged. CSW reiterated the importance in full participation.  Pt agreed to look at referral list and contact CSW with any questions.  CSW provided contact information to Pt.    Leron Croak, LCSWA [employer] [phone]

## 2011-10-10 NOTE — Consult Note (Signed)
Reason for Consult:Alcohol dependence and pancreatitis Referring Physician: Dr. Lorie Phenix [name] is an 56 y.o. female.  HPI: Patient was seen and chart reviewed. Psychiatric consultation was requested when patient was medically stable overnight but patient has been suffering with current complains of severe abdominal pain, nausea and epigastric pain since she had her lunch. She tried full liquids at lunch time and has had severe abdominal pain since then along with the nausea. Patient reported that she was informed to medical staff who has contacted her GI physician and waiting for further evaluation and work up. Patient requested not to continue psych evaluation at this time and willing to see psychiatrist when she was medically comfortable and stable. Patient wish was respected and agree to follow up with her later time.   This is a 56 year old female with past medical history of alcohol abuse and prior history of pancreatitis and pancreatic pseudocyst in the head of the who was admitted on 10/05/2011 with recurrent pancreatitis. She has completed GI consultation and an MRCP was done 10/06/2011 which showed dilated common bile duct with biliary sludge. She subsequently underwent an ERCP with sphincterotomy on 10/06/2011. There were clots and a small stone noted in her common bile duct, which were cleared during the procedure. The patient remains on TNA and PCA dilaudid for pain control.  Patient was found in her bed awake, alert and abdominal discomfort. She is currently receiving intravenous fluids.    Past Medical History  Diagnosis Date  . GERD (gastroesophageal reflux disease)   . Rosacea   . Elevated liver function tests   . Wears glasses   . Chronic diarrhea   . Pancreatitis 01/2010    2011 admission was second admission, 3rd in 02/2011  . Alcohol abuse     H/o withdrawal   . PONV (postoperative nausea and vomiting)     Past Surgical History  Procedure Date  . Cholecystectomy    . Shoulder surgery [id number]  . Diagnostic mammogram 2008  . Tubal ligation   . Colonoscopy Never  . Esophagogastroduodenoscopy 03/21/2011    Procedure: ESOPHAGOGASTRODUODENOSCOPY (EGD);  Surgeon: Yancey Flemings, MD;  Location[facility] ENDOSCOPY;  Service: Endoscopy;  Laterality: N/A;  Patient may need to be done at bedside tomorrow depending on status  . Ercp 10/06/2011    Procedure: ENDOSCOPIC RETROGRADE CHOLANGIOPANCREATOGRAPHY (ERCP);  Surgeon: Theda Belfast, MD;  Location: [facility] ENDOSCOPY;  Service: Endoscopy;  Laterality: N/A;    Family History  Problem Relation Age of Onset  . Stroke Mother   . Heart disease Father   . Heart failure Father   . Heart disease Sister     Social History:  reports that she quit smoking about 33 years ago. She has never used smokeless tobacco. She reports that she drinks about 1.5 - 2 ounces of alcohol per week. She reports that she does not use illicit drugs.  Allergies:  Allergies  Allergen Reactions  . Ciprofloxacin     REACTION: nausea    Medications: I have reviewed the patient's current medications.  Results for orders placed during the hospital encounter of 10/05/11 (from the past 48 hour(s))  GLUCOSE, CAPILLARY     Status: Abnormal   Collection Time   10/08/11  4:47 PM      Component Value Range Comment   Glucose-Capillary 105 (*) 70 - 99 mg/dL   GLUCOSE, CAPILLARY     Status: Abnormal   Collection Time   10/08/11  8:25 PM  Component Value Range Comment   Glucose-Capillary 184 (*) 70 - 99 mg/dL   GLUCOSE, CAPILLARY     Status: Abnormal   Collection Time   10/09/11 12:14 AM      Component Value Range Comment   Glucose-Capillary 127 (*) 70 - 99 mg/dL   GLUCOSE, CAPILLARY     Status: Abnormal   Collection Time   10/09/11  4:56 AM      Component Value Range Comment   Glucose-Capillary 160 (*) 70 - 99 mg/dL   TSH     Status: Normal   Collection Time   10/09/11  6:07 AM      Component Value Range Comment   TSH 2.997  0.350 - 4.500 uIU/mL    COMPREHENSIVE METABOLIC PANEL     Status: Abnormal   Collection Time   10/09/11  6:07 AM      Component Value Range Comment   Sodium 139  135 - 145 mEq/L    Potassium 3.5  3.5 - 5.1 mEq/L    Chloride 103  96 - 112 mEq/L    CO2 29  19 - 32 mEq/L    Glucose, Bld 124 (*) 70 - 99 mg/dL    BUN 8  6 - 23 mg/dL    Creatinine, Ser 1.61  0.50 - 1.10 mg/dL    Calcium 9.3  8.4 - 09.6 mg/dL    Total Protein 6.8  6.0 - 8.3 g/dL    Albumin 2.8 (*) 3.5 - 5.2 g/dL    AST 55 (*) 0 - 37 U/L    ALT 62 (*) 0 - 35 U/L    Alkaline Phosphatase 223 (*) 39 - 117 U/L    Total Bilirubin 1.5 (*) 0.3 - 1.2 mg/dL    GFR calc non Af Amer >90  >90 mL/min    GFR calc Af Amer >90  >90 mL/min   CBC     Status: Abnormal   Collection Time   10/09/11  6:07 AM      Component Value Range Comment   WBC 8.1  4.0 - 10.5 K/uL    RBC 3.84 (*) 3.87 - 5.11 MIL/uL    Hemoglobin 11.7 (*) 12.0 - 15.0 g/dL    HCT 04.5  40.9 - 81.1 %    MCV 95.8  78.0 - 100.0 fL    MCH 30.5  26.0 - 34.0 pg    MCHC 31.8  30.0 - 36.0 g/dL    RDW 91.4  78.2 - 95.6 %    Platelets 271  150 - 400 K/uL   GLUCOSE, CAPILLARY     Status: Abnormal   Collection Time   10/09/11  7:52 AM      Component Value Range Comment   Glucose-Capillary 179 (*) 70 - 99 mg/dL   GLUCOSE, CAPILLARY     Status: Abnormal   Collection Time   10/09/11 11:42 AM      Component Value Range Comment   Glucose-Capillary 110 (*) 70 - 99 mg/dL   GLUCOSE, CAPILLARY     Status: Abnormal   Collection Time   10/09/11  4:16 PM      Component Value Range Comment   Glucose-Capillary 119 (*) 70 - 99 mg/dL   GLUCOSE, CAPILLARY     Status: Abnormal   Collection Time   10/09/11  8:12 PM      Component Value Range Comment   Glucose-Capillary 128 (*) 70 - 99 mg/dL   GLUCOSE, CAPILLARY     Status:  Abnormal   Collection Time   10/09/11 11:43 PM      Component Value Range Comment   Glucose-Capillary 118 (*) 70 - 99 mg/dL   GLUCOSE, CAPILLARY     Status: Abnormal   Collection Time    10/10/11  3:53 AM      Component Value Range Comment   Glucose-Capillary 131 (*) 70 - 99 mg/dL   GLUCOSE, CAPILLARY     Status: Abnormal   Collection Time   10/10/11  7:53 AM      Component Value Range Comment   Glucose-Capillary 180 (*) 70 - 99 mg/dL   GLUCOSE, CAPILLARY     Status: Abnormal   Collection Time   10/10/11 11:26 AM      Component Value Range Comment   Glucose-Capillary 119 (*) 70 - 99 mg/dL     No results found.  Positive for excessive alcohol consumption Blood pressure 109/70, pulse 64, temperature 98.3 F (36.8 C), temperature source Oral, resp. rate 10, height 5\' 7"  (1.702 m), weight 153 lb 11.2 oz (69.718 kg), SpO2 94.00%.   Assessment/Plan: Alcohol dependence Recurrent alcohol pancreatitis  Recommendation: Please call consultation psychiatry upon patient is medically stable and comfortable to cooperate for evaluation. No psychiatric medication management change or disposition plans are recommended a this time.  Liese Dizdarevic,JANARDHAHA R. 10/10/2011, 2:52 PM

## 2011-10-10 NOTE — Progress Notes (Signed)
Clinical Social Work Department BRIEF PSYCHOSOCIAL ASSESSMENT 10/10/2011  Patient:  [name], [name]     Account Number:  [id number]     Admit date:  10/05/2011  Clinical Social Worker:  Leron Croak, CLINICAL SOCIAL WORKER  Date/Time:  10/10/2011 04:35 PM  Referred by:  Physician  Date Referred:  10/10/2011 Referred for  Substance Abuse   Other Referral:   Interview type:  Patient Other interview type:    PSYCHOSOCIAL DATA Living Status:  ALONE Admitted from facility:   Level of care:   Primary support name:  Reinaldo Raddle Primary support relationship to patient:  FRIEND Degree of support available:   fair    CURRENT CONCERNS Current Concerns  Substance Abuse   Other Concerns:    SOCIAL WORK ASSESSMENT / PLAN CSW met with Pt to assess for substance abuse concerns.   Assessment/plan status:  Information/Referral to [facility] Other assessment/ plan:   Information/referral to community resources:   CSW provided referral list for IOPT and contact information for CSW for any additional questions or concerns    PATIENT'S/FAMILY'S RESPONSE TO PLAN OF CARE: Pt was receptive to information referrals that were given. Pt still hesitent about recovery.        Leron Croak, LCSWA [employer] [phone]

## 2011-10-10 NOTE — Progress Notes (Signed)
PROGRESS NOTE  [name] ZOX:[id number] DOB: 1955/10/06 DOA: 10/05/2011 PCP: Thayer Headings, MD  Brief narrative: [name] is a 56 year old female with past medical history of alcohol abuse and prior history of pancreatitis and pancreatic pseudocyst in the head of the who was admitted on 10/05/2011 with recurrent pancreatitis. Because of elevated liver function tests, a GI consultation was requested and an MRCP was done 10/06/2011 which showed dilated common bile duct with biliary sludge. She subsequently underwent an ERCP with sphincterotomy on 10/06/2011. There were clots and a small stone noted in her common bile duct, which were cleared during the procedure. The bleeding was felt to be due to a communication between the pseudocyst and the common bile duct.  The patient remains on TNA and PCA dilaudid for pain control.  Interim History: [name] was stable overnight.    Assessment/Plan: Principal Problem:  *Pancreatitis caused by a combination of alcoholism, obstruction of the common bile duct by blood clots and a small stone, and extrinsic compression of the common bile duct by pancreatic pseudocyst  Status post MRCP/ERCP with sphincterotomy.  Placed on empiric antibiotics do to a hemorrhagic pseudocyst with communication with the common bile duct.  PICC line placed and started TPN 10/07/11.  Started on CL diet 10/08/11.  Try to advance to [name] diet today.  Continue PCA for pain control.    Lipase normalized. Active Problems:  Hyponatremia  TSH WNL.  Resolved.  Alcohol abuse  Placed on detox IV Ativan protocol per [facility].  No active DTs.  Social worker consultation for help with post discharge alcohol counseling.  Elevated liver function tests  Secondary common bile duct obstruction status post ERCP with sphincterotomy.   LFTs declining.  GERD (gastroesophageal reflux disease)  Continue IV PPI therapy.  Acute blood loss anemia  Hemoglobin stable after an  initial drop.   Code Status: Full Code Family Communication: None at bedside. Disposition Plan: Home, when stable.  Medical Consultants:  Dr. Luisa Hart hung, Gastroenterology  Psychiatry  Other consultants:  Pharmacy  Antibiotics:  Primaxin 10/06/2011--->  Unasyn 10/06/1998--->10/06/11  Rocephin 10/05/2011---> 10/05/2011   Subjective  [name] continues to report abdominal pain, bloating and nausea.  No vomiting.  No tremulousness.  Tolerating clear liquids and wants to try full liquids.   Objective   Objective: Filed Vitals:   10/10/11 0055 10/10/11 0345 10/10/11 0514 10/10/11 0806  BP:   109/70   Pulse: 63 79 64   Temp:   98.3 F (36.8 C)   TempSrc:   Oral   Resp: 14 14 12 9   Height:      Weight:      SpO2: 98% 99% 97% 97%    Intake/Output Summary (Last 24 hours) at 10/10/11 0932 Last data filed at 10/10/11 8119  Gross per 24 hour  Intake 4218.56 ml  Output   4300 ml  Net -81.44 ml    Exam: Gen:  NAD Cardiovascular:  RRR, No M/R/G Respiratory: Lungs CTAB Gastrointestinal: Abdomen soft, NT/ND with normal active bowel sounds. Extremities: No C/E/C  Data Reviewed: Basic Metabolic Panel:  Lab 10/09/11 1478 10/08/11 0320 10/06/11 0349 10/05/11 0305  NA 139 133* 136 135  K 3.5 3.5 -- --  CL 103 96 101 95*  CO2 29 27 25 25   GLUCOSE 124* 173* 83 133*  BUN 8 6 8 11   CREATININE 0.54 0.57 0.70 0.54  CALCIUM 9.3 9.4 9.0 9.9  MG -- 1.8 -- --  PHOS -- 3.6 -- --   GFR  Estimated Creatinine Clearance: 76.4 ml/min (by C-G formula based on Cr of 0.54). Liver Function Tests:  Lab 10/09/11 0607 10/08/11 0320 10/06/11 0349 10/05/11 0305  AST 55* 112* 144* 157*  ALT 62* 81* 85* 81*  ALKPHOS 223* 242* 201* 228*  BILITOT 1.5* 3.4* 5.4* 3.7*  PROT 6.8 6.8 6.4 7.9  ALBUMIN 2.8* 3.0* 2.9* 3.6    Lab 10/06/11 0340 10/05/11 0305  LIPASE 40 270*  AMYLASE -- 226*   Coagulation profile  Lab 10/06/11 0349  INR 0.97  PROTIME --    CBC:  Lab 10/09/11  0607 10/08/11 0320 10/06/11 0349 10/05/11 0305  WBC 8.1 7.7 7.0 14.0*  NEUTROABS -- 5.5 -- 11.5*  HGB 11.7* 11.5* 11.6* 14.2  HCT 36.8 35.8* 36.6 41.9  MCV 95.8 94.5 96.3 92.3  PLT 271 294 335 485*   TSH:  Ref. Range 09/15/2009 22:50  TSH Latest Range: 0.350-4.500 uIU/mL 2.727 Test methodology is 3rd generation TSH   Microbiology Recent Results (from the past 240 hour(s))  URINE CULTURE     Status: Normal   Collection Time   10/05/11  3:36 AM      Component Value Range Status Comment   Specimen Description URINE, CLEAN CATCH   Final    Special Requests Normal   Final    Culture  Setup Time [id number]   Final    Colony Count 60,000 COLONIES/ML   Final    Culture     Final    Value: Multiple bacterial morphotypes present, none predominant. Suggest appropriate recollection if clinically indicated.   Report Status 10/06/2011 FINAL   Final     Procedures and Diagnostic Studies:  US Abdomen Complete 10/05/2011 IMPRESSION:  1.  Intra and extrahepatic biliary ductal dilatation.  Sludge is identified within the common bile duct.  No ductal stone is seen. 2.  Status post cholecystectomy. 3.  Low attenuation collection in the pancreatic head is seen as on the most recent CT scans compatible with a pseudocyst.  Original Report Authenticated By: Bernadene Bell. D'ALESSIO, M.D.    Mr 3d Recon At Scanner 10/06/2011 IMPRESSION: 1.  Severe dilatation of intrahepatic biliary tree and common bile duct which abruptly tapers to a more normal caliber as the distal common bile duct passes adjacent to a complex pancreatic head pseudocyst.  No distal ductal stone is identified.  However, there is a large amount of biliary sludge in the distal common bile duct. 2.  The main pancreatic duct is only mildly dilated, measuring 4 mm.  However, as this duct extends into the head of the pancreas it is not visualized once it comes in proximity to the pancreatic head pseudocyst.  This could suggest significant mass effect upon  the duct, or communication of the duct with this pseudocyst.  This pancreatic head pseudocyst is highly complex with internal debris (no evidence of internal enhancement to suggest neoplasm at this time). 3.  Smaller simple pseudocysts in the neck of the pancreas, as above. 4.  Additional incidental findings, as above.  Original Report Authenticated By: Florencia Reasons, M.D.    Dg Ercp With Sphincterotomy 10/06/2011  IMPRESSION: Dilated common bile duct with filling defects in the common hepatic duct which could be due to stones, sludge, or blood.  Sphincterotomy performed.  These images were submitted for radiologic interpretation only. Please see the procedural report for the amount of contrast and the fluoroscopy time utilized.  Original Report Authenticated By: Camelia Phenes, M.D.    Mr Abd W/wo  Cm/mrcp 10/06/2011 IMPRESSION: 1.  Severe dilatation of intrahepatic biliary tree and common bile duct which abruptly tapers to a more normal caliber as the distal common bile duct passes adjacent to a complex pancreatic head pseudocyst.  No distal ductal stone is identified.  However, there is a large amount of biliary sludge in the distal common bile duct. 2.  The main pancreatic duct is only mildly dilated, measuring 4 mm.  However, as this duct extends into the head of the pancreas it is not visualized once it comes in proximity to the pancreatic head pseudocyst.  This could suggest significant mass effect upon the duct, or communication of the duct with this pseudocyst.  This pancreatic head pseudocyst is highly complex with internal debris (no evidence of internal enhancement to suggest neoplasm at this time). 3.  Smaller simple pseudocysts in the neck of the pancreas, as above. 4.  Additional incidental findings, as above.  Original Report Authenticated By: Florencia Reasons, M.D.    Scheduled Meds:    . antiseptic oral rinse  15 mL Mouth Rinse q12n4p  . folic acid  1 mg Intravenous Daily  .  HYDROmorphone PCA 0.3 mg/mL   Intravenous Q4H  . imipenem-cilastatin  500 mg Intravenous Q6H  . insulin aspart  0-15 Units Subcutaneous Q4H  . LORazepam  0-4 mg Intravenous Q6H   Followed by  . LORazepam  0-4 mg Intravenous Q12H  . pantoprazole (PROTONIX) IV  40 mg Intravenous Q12H  . thiamine  100 mg Oral Daily   Or  . thiamine  100 mg Intravenous Daily   Continuous Infusions:    . fat emulsion 240 mL (10/09/11 1757)  . TPN (CLINIMIX) +/- additives 60 mL/hr at 10/08/11 1838  . TPN (CLINIMIX) +/- additives 80 mL/hr at 10/09/11 1756  . TPN (CLINIMIX) +/- additives    . DISCONTD: TPN (CLINIMIX) +/- additives        LOS: 5 days   Hillery Aldo, MD Pager [phone]  10/10/2011, 9:32 AM

## 2011-10-10 NOTE — Progress Notes (Signed)
PARENTERAL NUTRITION CONSULT NOTE - Follow Up  Pharmacy Consult for  TNA Indication: pancreatitis  Allergies  Allergen Reactions  . Ciprofloxacin     REACTION: nausea    Patient Measurements: Height: 5\' 7"  (170.2 cm) Weight: 153 lb 11.2 oz (69.718 kg) IBW/kg (Calculated) : 61.6    Vital Signs: Temp: 98.3 F (36.8 C) (06/22 0514) Temp src: Oral (06/22 0514) BP: 109/70 mmHg (06/22 0514) Pulse Rate: 64  (06/22 0514) Intake/Output from previous day: 06/21 0701 - 06/22 0700 In: 1200 [P.O.:1200] Out: 4300 [Urine:4300] Intake/Output from this shift:    Labs:  [facility] 10/09/11 0607 10/08/11 0320  WBC 8.1 7.7  HGB 11.7* 11.5*  HCT 36.8 35.8*  PLT 271 294  APTT -- --  INR -- --     [name] 10/09/11 0607 10/08/11 0320  NA 139 133*  K 3.5 3.5  CL 103 96  CO2 29 27  GLUCOSE 124* 173*  BUN 8 6  CREATININE 0.54 0.57  LABCREA -- --  CREAT24HRUR -- --  CALCIUM 9.3 9.4  MG -- 1.8  PHOS -- 3.6  PROT 6.8 6.8  ALBUMIN 2.8* 3.0*  AST 55* 112*  ALT 62* 81*  ALKPHOS 223* 242*  BILITOT 1.5* 3.4*  BILIDIR -- --  IBILI -- --  PREALBUMIN -- 18.1  TRIG -- 196*  CHOLHDL -- --  CHOL -- 211*   Estimated Creatinine Clearance: 76.4 ml/min (by C-G formula based on Cr of 0.54).    Medical History: Past Medical History  Diagnosis Date  . GERD (gastroesophageal reflux disease)   . Rosacea   . Elevated liver function tests   . Wears glasses   . Chronic diarrhea   . Pancreatitis 01/2010    2011 admission was second admission, 3rd in 02/2011  . Alcohol abuse     H/o withdrawal   . PONV (postoperative nausea and vomiting)     Medications:  Scheduled:     . antiseptic oral rinse  15 mL Mouth Rinse q12n4p  . folic acid  1 mg Intravenous Daily  . HYDROmorphone PCA 0.3 mg/mL   Intravenous Q4H  . imipenem-cilastatin  500 mg Intravenous Q6H  . insulin aspart  0-15 Units Subcutaneous Q4H  . LORazepam  0-4 mg Intravenous Q6H   Followed by  . LORazepam  0-4 mg  Intravenous Q12H  . pantoprazole (PROTONIX) IV  40 mg Intravenous Q12H  . thiamine  100 mg Oral Daily   Or  . thiamine  100 mg Intravenous Daily  . DISCONTD: imipenem-cilastatin  500 mg Intravenous Q8H   Infusions:     . fat emulsion 240 mL (10/09/11 1757)  . TPN (CLINIMIX) +/- additives 60 mL/hr at 10/08/11 1838  . TPN (CLINIMIX) +/- additives 80 mL/hr at 10/09/11 1756  . DISCONTD: TPN (CLINIMIX) +/- additives    . DISCONTD: TPN (CLINIMIX) +/- additives     PRN: diphenhydrAMINE, diphenhydrAMINE, LORazepam, LORazepam, LORazepam, LORazepam, metoCLOPramide (REGLAN) injection, naloxone, ondansetron (ZOFRAN) IV, oxyCODONE, sodium chloride  Insulin Requirements in the past 24 hours:  5 units SSI required CBG 179, 110, 119, 128, 118, 131  Current Nutrition:  Clear liquids: ate 30-100% trays yesterday, c/o nausea  Clinimix E 5/20% to 80 ml/hr (goal rate)  Lipids 20% at 15ml/hr MWF only  No MIVF  Assessment: [name] w/ acute ETOH pancreatitis, TNA started 6/19  CBG improved with increased SSI  Lytes wnl on 6/21  Elevated LFT, likely disease related  Prealbumin: 18.1 (6/20)  TG: slightly elevated at  196 (6/20)  Nutritional Goals:  Kcal:1775-2050  Protein:105-125g Clinimix E 5/20% @ 67ml/hr + Lipids 20% 35ml/hr (MWF only) = 96g Protein and avg 1896 kcal/day  Plan:   Continue Clinimix E 5/20% to 80 ml/hr (goal rate)  Await further advancement in diet/toleration to wean TNA  Continue SSI q4h - moderate schale  Lipids/MVI and TE MWF only d/t shortage  TNA lab panel Mon and Arline Asp, PharmD, BCPS Pager: [phone] 10/10/2011 7:28 AM

## 2011-10-11 DIAGNOSIS — K859 Acute pancreatitis without necrosis or infection, unspecified: Secondary | ICD-10-CM

## 2011-10-11 DIAGNOSIS — K760 Fatty (change of) liver, not elsewhere classified: Secondary | ICD-10-CM

## 2011-10-11 DIAGNOSIS — F1010 Alcohol abuse, uncomplicated: Secondary | ICD-10-CM

## 2011-10-11 DIAGNOSIS — R740 Nonspecific elevation of levels of transaminase and lactic acid dehydrogenase [LDH]: Secondary | ICD-10-CM

## 2011-10-11 DIAGNOSIS — F329 Major depressive disorder, single episode, unspecified: Secondary | ICD-10-CM | POA: Diagnosis present

## 2011-10-11 DIAGNOSIS — F32A Depression, unspecified: Secondary | ICD-10-CM | POA: Diagnosis present

## 2011-10-11 HISTORY — DX: Fatty (change of) liver, not elsewhere classified: K76.0

## 2011-10-11 LAB — GLUCOSE, CAPILLARY
Glucose-Capillary: 137 mg/dL — ABNORMAL HIGH (ref 70–99)
Glucose-Capillary: 159 mg/dL — ABNORMAL HIGH (ref 70–99)

## 2011-10-11 LAB — BASIC METABOLIC PANEL
Chloride: 103 mEq/L (ref 96–112)
GFR calc Af Amer: >90 mL/min (ref >90)
GFR calc non Af Amer: >90 mL/min (ref >90)
Potassium: 4.2 mEq/L (ref 3.5–5.1)

## 2011-10-11 MED ORDER — CLINIMIX E/DEXTROSE (5/20) 5 % IV SOLN
INTRAVENOUS | Status: AC
  Administered 2011-10-11: 17:00:00 via INTRAVENOUS
  Filled 2011-10-11: qty 2000

## 2011-10-11 MED ORDER — DOCUSATE SODIUM 50 MG/5ML PO LIQD
200.0000 mg | Freq: Two times a day (BID) | ORAL | Status: DC
  Administered 2011-10-11 – 2011-10-14 (×4): 200 mg via ORAL
  Filled 2011-10-11 (×8): qty 20

## 2011-10-11 NOTE — Progress Notes (Signed)
Subjective: Since I last evaluated the patient, she seems to be doing a lot better. She has not had a BM in several days. Nausea and abdominal pain much improved. Some epigastric soreness. Has not ambulated much at all.   Objective: Vital signs in last 24 hours: Temp:  [98 F (36.7 C)-98.4 F (36.9 C)] 98.4 F (36.9 C) (06/23 0503) Pulse Rate:  [66-72] 72  (06/23 0503) Resp:  [8-18] 10  (06/23 0754) BP: (110-121)/(67-72) 110/69 mmHg (06/23 0503) SpO2:  [94 %-99 %] 97 % (06/23 0754) FiO2 (%):  [28 %] 28 % (06/23 0006) Last BM Date: 10/04/11  Intake/Output from previous day: 06/22 0701 - 06/23 0700 In: 1281.9 [P.O.:720; I.V.:7.9; IV Piggyback:1; TPN:553] Out: 3000 [Urine:3000] Intake/Output this shift: Total I/O In: 0  Out: 750 [Urine:750]  General appearance: alert, cooperative, appears stated age and no distress Resp: clear to auscultation bilaterally Cardio: regular rate and rhythm, S1, S2 normal, no murmur, click, rub or gallop GI: soft, non-tender; bowel sounds normal; no masses,  no organomegaly Extremities: extremities normal, atraumatic, no cyanosis or edema  Lab Results:  [facility] 10/09/11 0607  WBC 8.1  HGB 11.7*  HCT 36.8  PLT 271   BMET  Basename 10/11/11 0835 10/09/11 0607  NA 137 139  K 4.2 3.5  CL 103 103  CO2 27 29  GLUCOSE 154* 124*  BUN 12 8  CREATININE 0.53 0.54  CALCIUM 9.2 9.3   LFT  Basename 10/09/11 0607  PROT 6.8  ALBUMIN 2.8*  AST 55*  ALT 62*  ALKPHOS 223*  BILITOT 1.5*  BILIDIR --  IBILI --   Medications: I have reviewed the patient's current medications.  Assessment/Plan: 1) Acute pancreatitis; improving gradually,aortic stenosis per my discussion with Dr. Darnelle Catalan, minimize pain medication and encourage ambulation.  2) Narcotic induced constipation: Try Colace 200 mg PO BID.   3) GERD/Hiatal hernia: On PPI's. 4) History of alcoholism/fatty liver.  LOS: 6 days   Tiphani Mells 10/11/2011, 12:47 PM

## 2011-10-11 NOTE — Progress Notes (Signed)
PARENTERAL NUTRITION CONSULT NOTE - Follow Up  Pharmacy Consult for  TNA Indication: pancreatitis  Allergies  Allergen Reactions  . Ciprofloxacin     REACTION: nausea    Patient Measurements: Height: 5\' 7"  (170.2 cm) Weight: 153 lb 11.2 oz (69.718 kg) IBW/kg (Calculated) : 61.6    Vital Signs: Temp: 98.4 F (36.9 C) (06/23 0503) Temp src: Oral (06/23 0503) BP: 110/69 mmHg (06/23 0503) Pulse Rate: 72  (06/23 0503) Intake/Output from previous day: 06/22 0701 - 06/23 0700 In: 1281.9 [P.O.:720; I.V.:7.9; IV Piggyback:1; TPN:553] Out: 3000 [Urine:3000] Intake/Output from this shift:    Labs:  [facility] 10/09/11 0607  WBC 8.1  HGB 11.7*  HCT 36.8  PLT 271  APTT --  INR --     [facility] 10/09/11 0607  NA 139  K 3.5  CL 103  CO2 29  GLUCOSE 124*  BUN 8  CREATININE 0.54  LABCREA --  CREAT24HRUR --  CALCIUM 9.3  MG --  PHOS --  PROT 6.8  ALBUMIN 2.8*  AST 55*  ALT 62*  ALKPHOS 223*  BILITOT 1.5*  BILIDIR --  IBILI --  PREALBUMIN --  TRIG --  CHOLHDL --  CHOL --   Estimated Creatinine Clearance: 76.4 ml/min (by C-G formula based on Cr of 0.54).    Medical History: Past Medical History  Diagnosis Date  . GERD (gastroesophageal reflux disease)   . Rosacea   . Elevated liver function tests   . Wears glasses   . Chronic diarrhea   . Pancreatitis 01/2010    2011 admission was second admission, 3rd in 02/2011  . Alcohol abuse     H/o withdrawal   . PONV (postoperative nausea and vomiting)     Medications:  Scheduled:     . antiseptic oral rinse  15 mL Mouth Rinse q12n4p  . bisacodyl  10 mg Rectal Once  . folic acid  1 mg Intravenous Daily  . HYDROmorphone PCA 0.3 mg/mL   Intravenous Q4H  . imipenem-cilastatin  500 mg Intravenous Q6H  . insulin aspart  0-15 Units Subcutaneous Q4H  . ketorolac  30 mg Intravenous Once  . LORazepam  0-4 mg Intravenous Q12H  . pantoprazole (PROTONIX) IV  40 mg Intravenous Q12H  . thiamine  100 mg Oral Daily     Or  . thiamine  100 mg Intravenous Daily   Infusions:     . fat emulsion 240 mL (10/09/11 1757)  . TPN (CLINIMIX) +/- additives 80 mL/hr at 10/09/11 1756  . TPN (CLINIMIX) +/- additives 80 mL/hr at 10/10/11 1731   PRN: diphenhydrAMINE, diphenhydrAMINE, LORazepam, LORazepam, LORazepam, metoCLOPramide (REGLAN) injection, naloxone, ondansetron (ZOFRAN) IV, oxyCODONE, sodium chloride, DISCONTD: LORazepam  Insulin Requirements in the past 24 hours:  11 units SSI required CBG 119, 137, 130, 131, 137  Current Nutrition:   Clinimix E 5/20% to 80 ml/hr (goal rate)  Lipids 20% at 24ml/hr MWF only  No MIVF  Diet changed to NPO on 6/22 due to severe nausea/pain with liquids  Assessment: 55 YOF w/ acute ETOH pancreatitis, TNA started 6/19  CBG at goal < 150 SSI  Lytes wnl on 6/21  Elevated LFT, likely disease related  Prealbumin: 18.1 (6/20)  TG: slightly elevated at 196 (6/20)  Nutritional Goals:  Kcal:1775-2050  Protein:105-125g Clinimix E 5/20% @ 103ml/hr + Lipids 20% 50ml/hr (MWF only) = 96g Protein and avg 1896 kcal/day  Plan:   Continue Clinimix E 5/20% to 80 ml/hr (goal rate)  Continue SSI q4h - moderate  schale  Lipids/MVI and TE MWF only d/t shortage  TNA lab panel Mon and Thurs  Loralee Pacas, PharmD, BCPS Pager: [phone] 10/11/2011 7:50 AM

## 2011-10-11 NOTE — Progress Notes (Signed)
PROGRESS NOTE  [name] ZOX:[id number] DOB: [birth year] DOA: 10/05/2011 PCP: Thayer Headings, MD  Brief narrative: Ms. Burd is a 56 year old female with past medical history of alcohol abuse and prior history of pancreatitis and pancreatic pseudocyst in the head of the who was admitted on 10/05/2011 with recurrent pancreatitis. Because of elevated liver function tests, a GI consultation was requested and an MRCP was done 10/06/2011 which showed dilated common bile duct with biliary sludge. She subsequently underwent an ERCP with sphincterotomy on 10/06/2011. There were clots and a small stone noted in her common bile duct, which were cleared during the procedure. The bleeding was felt to be due to a communication between the pseudocyst and the common bile duct.  The patient remains on TNA and PCA dilaudid for pain control.  Interim History: Ms. Tupou had severe pain with diet advancement to [facility] yesterday, prompting [place] to make her NPO again.    Assessment/Plan: Principal Problem:  *Pancreatitis caused by a combination of alcoholism, obstruction of the common bile duct by blood clots and a small stone, and extrinsic compression of the common bile duct by pancreatic pseudocyst  Status post MRCP/ERCP with sphincterotomy.  Placed on empiric antibiotics do to a hemorrhagic pseudocyst with communication with the common bile duct.  PICC line placed and started TPN 10/07/11.  Started on CL diet 10/08/11, advanced to [facility] diet 10/10/11, which she did not tolerate.  Currently NPO again.  Continue PCA for pain control.    Lipase normalized.  Recheck in a.m. Active Problems:  Hyponatremia  TSH WNL.  Resolved.  Alcohol abuse / depression  Placed on detox IV Ativan protocol per [facility].  No active DTs.  Social worker consultation done 10/10/11 for help with post discharge alcohol counseling.  Psychiatrist consulted per patient request due to underlying depression, assessment deferred  10/10/11 due to patient request.  (She was in too much pain to participate)  Elevated liver function tests / Fatty liver  Secondary common bile duct obstruction status post ERCP with sphincterotomy.   LFTs declining.  Counseled on need for abstinence.  GERD (gastroesophageal reflux disease)  Continue IV PPI therapy.  Acute blood loss anemia  Hemoglobin stable after an initial drop.   Code Status: Full Code Family Communication: None at bedside. Disposition Plan: Home, when stable.  Medical Consultants:  Dr. Luisa Hart hung, Gastroenterology  Dr. Darrol Jump, Psychiatry  Other consultants:  Pharmacy  Antibiotics:  Primaxin 10/06/2011--->  Unasyn 10/06/1998--->10/06/11  Rocephin 10/05/2011---> 10/05/2011   Subjective  Ms. Krah says her pain is better today.  Nausea is improved.  No vomiting.  No BM.   Objective   Objective: Filed Vitals:   10/11/11 0418 10/11/11 0503 10/11/11 0754 10/11/11 1259  BP:  110/69    Pulse:  72    Temp:  98.4 F (36.9 C)    TempSrc:  Oral    Resp: 12 14 10 10   Height:      Weight:      SpO2: 98% 96% 97% 99%    Intake/Output Summary (Last 24 hours) at 10/11/11 1302 Last data filed at 10/11/11 1100  Gross per 24 hour  Intake    600 ml  Output   3250 ml  Net  -2650 ml    Exam: Gen:  NAD Cardiovascular:  RRR, No M/R/G Respiratory: Lungs CTAB Gastrointestinal: Abdomen soft, NT/ND with normal active bowel sounds. Extremities: No C/E/C  Data Reviewed: Basic Metabolic Panel:  Lab 10/11/11 8119 10/09/11 1478 10/08/11 0320 10/06/11 2956 10/05/11 2130  NA 137 139 133* 136 135  K 4.2 3.5 -- -- --  CL 103 103 96 101 95*  CO2 27 29 27 25 25   GLUCOSE 154* 124* 173* 83 133*  BUN 12 8 6 8 11   CREATININE 0.53 0.54 0.57 0.70 0.54  CALCIUM 9.2 9.3 9.4 9.0 9.9  MG -- -- 1.8 -- --  PHOS -- -- 3.6 -- --   GFR Estimated Creatinine Clearance: 76.4 ml/min (by C-G formula based on Cr of 0.53). Liver Function Tests:  Lab  10/09/11 0607 10/08/11 0320 10/06/11 0349 10/05/11 0305  AST 55* 112* 144* 157*  ALT 62* 81* 85* 81*  ALKPHOS 223* 242* 201* 228*  BILITOT 1.5* 3.4* 5.4* 3.7*  PROT 6.8 6.8 6.4 7.9  ALBUMIN 2.8* 3.0* 2.9* 3.6    Lab 10/06/11 0340 10/05/11 0305  LIPASE 40 270*  AMYLASE -- 226*   Coagulation profile  Lab 10/06/11 0349  INR 0.97  PROTIME --    CBC:  Lab 10/09/11 0607 10/08/11 0320 10/06/11 0349 10/05/11 0305  WBC 8.1 7.7 7.0 14.0*  NEUTROABS -- 5.5 -- 11.5*  HGB 11.7* 11.5* 11.6* 14.2  HCT 36.8 35.8* 36.6 41.9  MCV 95.8 94.5 96.3 92.3  PLT 271 294 335 485*   TSH:  Ref. Range 09/15/2009 22:50  TSH Latest Range: 0.350-4.500 uIU/mL 2.727 Test methodology is 3rd generation TSH   Microbiology Recent Results (from the past 240 hour(s))  URINE CULTURE     Status: Normal   Collection Time   10/05/11  3:36 AM      Component Value Range Status Comment   Specimen Description URINE, CLEAN CATCH   Final    Special Requests Normal   Final    Culture  Setup Time [id number]   Final    Colony Count 60,000 COLONIES/ML   Final    Culture     Final    Value: Multiple bacterial morphotypes present, none predominant. Suggest appropriate recollection if clinically indicated.   Report Status 10/06/2011 FINAL   Final     Procedures and Diagnostic Studies:  US Abdomen Complete 10/05/2011 IMPRESSION:  1.  Intra and extrahepatic biliary ductal dilatation.  Sludge is identified within the common bile duct.  No ductal stone is seen. 2.  Status post cholecystectomy. 3.  Low attenuation collection in the pancreatic head is seen as on the most recent CT scans compatible with a pseudocyst.  Original Report Authenticated By: Bernadene Bell. D'ALESSIO, M.D.    Mr 3d Recon At Scanner 10/06/2011 IMPRESSION: 1.  Severe dilatation of intrahepatic biliary tree and common bile duct which abruptly tapers to a more normal caliber as the distal common bile duct passes adjacent to a complex pancreatic head pseudocyst.   No distal ductal stone is identified.  However, there is a large amount of biliary sludge in the distal common bile duct. 2.  The main pancreatic duct is only mildly dilated, measuring 4 mm.  However, as this duct extends into the head of the pancreas it is not visualized once it comes in proximity to the pancreatic head pseudocyst.  This could suggest significant mass effect upon the duct, or communication of the duct with this pseudocyst.  This pancreatic head pseudocyst is highly complex with internal debris (no evidence of internal enhancement to suggest neoplasm at this time). 3.  Smaller simple pseudocysts in the neck of the pancreas, as above. 4.  Additional incidental findings, as above.  Original Report Authenticated By: Florencia Reasons, M.D.  Dg Ercp With Sphincterotomy 10/06/2011  IMPRESSION: Dilated common bile duct with filling defects in the common hepatic duct which could be due to stones, sludge, or blood.  Sphincterotomy performed.  These images were submitted for radiologic interpretation only. Please see the procedural report for the amount of contrast and the fluoroscopy time utilized.  Original Report Authenticated By: Camelia Phenes, M.D.    Mr Abd W/wo Cm/mrcp 10/06/2011 IMPRESSION: 1.  Severe dilatation of intrahepatic biliary tree and common bile duct which abruptly tapers to a more normal caliber as the distal common bile duct passes adjacent to a complex pancreatic head pseudocyst.  No distal ductal stone is identified.  However, there is a large amount of biliary sludge in the distal common bile duct. 2.  The main pancreatic duct is only mildly dilated, measuring 4 mm.  However, as this duct extends into the head of the pancreas it is not visualized once it comes in proximity to the pancreatic head pseudocyst.  This could suggest significant mass effect upon the duct, or communication of the duct with this pseudocyst.  This pancreatic head pseudocyst is highly complex with  internal debris (no evidence of internal enhancement to suggest neoplasm at this time). 3.  Smaller simple pseudocysts in the neck of the pancreas, as above. 4.  Additional incidental findings, as above.  Original Report Authenticated By: Florencia Reasons, M.D.    Scheduled Meds:    . antiseptic oral rinse  15 mL Mouth Rinse q12n4p  . bisacodyl  10 mg Rectal Once  . docusate  200 mg Oral BID  . folic acid  1 mg Intravenous Daily  . HYDROmorphone PCA 0.3 mg/mL   Intravenous Q4H  . imipenem-cilastatin  500 mg Intravenous Q6H  . insulin aspart  0-15 Units Subcutaneous Q4H  . ketorolac  30 mg Intravenous Once  . LORazepam  0-4 mg Intravenous Q12H  . pantoprazole (PROTONIX) IV  40 mg Intravenous Q12H  . thiamine  100 mg Oral Daily   Or  . thiamine  100 mg Intravenous Daily   Continuous Infusions:    . fat emulsion 240 mL (10/09/11 1757)  . TPN (CLINIMIX) +/- additives 80 mL/hr at 10/09/11 1756  . TPN (CLINIMIX) +/- additives 80 mL/hr at 10/10/11 1731  . TPN [facility]) +/- additives        LOS: 6 days   Hillery Aldo, MD Pager [phone]  10/11/2011, 1:02 PM

## 2011-10-12 DIAGNOSIS — K859 Acute pancreatitis without necrosis or infection, unspecified: Secondary | ICD-10-CM

## 2011-10-12 DIAGNOSIS — F1010 Alcohol abuse, uncomplicated: Secondary | ICD-10-CM

## 2011-10-12 DIAGNOSIS — F329 Major depressive disorder, single episode, unspecified: Secondary | ICD-10-CM

## 2011-10-12 DIAGNOSIS — R740 Nonspecific elevation of levels of transaminase and lactic acid dehydrogenase [LDH]: Secondary | ICD-10-CM

## 2011-10-12 LAB — DIFFERENTIAL
Basophils Relative: 1 % (ref 0–1)
Lymphs Abs: 2.1 10*3/uL (ref 0.7–4.0)
Monocytes Absolute: 0.9 10*3/uL (ref 0.1–1.0)
Monocytes Relative: 13 % — ABNORMAL HIGH (ref 3–12)
Neutro Abs: 3.1 10*3/uL (ref 1.7–7.7)

## 2011-10-12 LAB — CBC
HCT: 33.2 % — ABNORMAL LOW (ref 36.0–46.0)
Hemoglobin: 10.3 g/dL — ABNORMAL LOW (ref 12.0–15.0)
MCHC: 31 g/dL (ref 30.0–36.0)
RBC: 3.39 MIL/uL — ABNORMAL LOW (ref 3.87–5.11)

## 2011-10-12 LAB — TRIGLYCERIDES: Triglycerides: 141 mg/dL (ref <150)

## 2011-10-12 LAB — PHOSPHORUS: Phosphorus: 3.6 mg/dL (ref 2.3–4.6)

## 2011-10-12 LAB — COMPREHENSIVE METABOLIC PANEL
Alkaline Phosphatase: 233 U/L — ABNORMAL HIGH (ref 39–117)
BUN: 11 mg/dL (ref 6–23)
Chloride: 103 mEq/L (ref 96–112)
GFR calc Af Amer: >90 mL/min (ref >90)
Glucose, Bld: 150 mg/dL — ABNORMAL HIGH (ref 70–99)
Potassium: 4 mEq/L (ref 3.5–5.1)
Total Bilirubin: 1.1 mg/dL (ref 0.3–1.2)

## 2011-10-12 LAB — GLUCOSE, CAPILLARY
Glucose-Capillary: 133 mg/dL — ABNORMAL HIGH (ref 70–99)
Glucose-Capillary: 141 mg/dL — ABNORMAL HIGH (ref 70–99)
Glucose-Capillary: 123 mg/dL — ABNORMAL HIGH (ref 70–99)

## 2011-10-12 LAB — LIPASE, BLOOD: Lipase: 26 U/L (ref 11–59)

## 2011-10-12 LAB — PREALBUMIN: Prealbumin: 17 mg/dL — ABNORMAL LOW (ref 17.0–34.0)

## 2011-10-12 MED ORDER — FOLIC ACID 1 MG PO TABS
1.0000 mg | ORAL_TABLET | Freq: Every day | ORAL | Status: DC
  Administered 2011-10-12 – 2011-10-14 (×3): 1 mg via ORAL
  Filled 2011-10-12 (×3): qty 1

## 2011-10-12 MED ORDER — LORAZEPAM 1 MG PO TABS
1.0000 mg | ORAL_TABLET | Freq: Four times a day (QID) | ORAL | Status: DC | PRN
  Administered 2011-10-12 – 2011-10-14 (×8): 1 mg via ORAL
  Filled 2011-10-12 (×8): qty 1

## 2011-10-12 MED ORDER — METOCLOPRAMIDE HCL 5 MG PO TABS
5.0000 mg | ORAL_TABLET | Freq: Three times a day (TID) | ORAL | Status: DC
  Administered 2011-10-12 – 2011-10-14 (×10): 5 mg via ORAL
  Filled 2011-10-12 (×13): qty 1

## 2011-10-12 MED ORDER — BUPROPION HCL 75 MG PO TABS
75.0000 mg | ORAL_TABLET | Freq: Every day | ORAL | Status: DC
  Administered 2011-10-12 – 2011-10-14 (×3): 75 mg via ORAL
  Filled 2011-10-12 (×3): qty 1

## 2011-10-12 MED ORDER — OXYCODONE HCL 5 MG PO TABS
10.0000 mg | ORAL_TABLET | ORAL | Status: DC | PRN
  Administered 2011-10-12 – 2011-10-14 (×9): 10 mg via ORAL
  Filled 2011-10-12 (×9): qty 2

## 2011-10-12 MED ORDER — HYDROMORPHONE 0.3 MG/ML IV SOLN
INTRAVENOUS | Status: AC
  Administered 2011-10-12: 1.59 mg via INTRAVENOUS

## 2011-10-12 MED ORDER — PANTOPRAZOLE SODIUM 40 MG PO TBEC
40.0000 mg | DELAYED_RELEASE_TABLET | Freq: Every day | ORAL | Status: DC
  Administered 2011-10-12 – 2011-10-14 (×3): 40 mg via ORAL
  Filled 2011-10-12 (×5): qty 1

## 2011-10-12 NOTE — Progress Notes (Signed)
PROGRESS NOTE  [name]:[id number] DOB: [birth year] DOA: 10/05/2011 PCP: Thayer Headings, MD  Brief narrative: [name] is a 56 year old female with past medical history of alcohol abuse and prior history of pancreatitis and pancreatic pseudocyst in the head of the who was admitted on 10/05/2011 with recurrent pancreatitis. Because of elevated liver function tests, a GI consultation was requested and an MRCP was done 10/06/2011 which showed dilated common bile duct with biliary sludge. She subsequently underwent an ERCP with sphincterotomy on 10/06/2011. There were clots and a small stone noted in her common bile duct, which were cleared during the procedure. The bleeding was felt to be due to a communication between the pseudocyst and the common bile duct.  The patient remains on TNA and PCA dilaudid for pain control.  We are weaning these.  Interim History: [name] was stable overnight.    Assessment/Plan: Principal Problem:  *Pancreatitis caused by a combination of alcoholism, obstruction of the common bile duct by blood clots and a small stone, and extrinsic compression of the common bile duct by pancreatic pseudocyst  Status post MRCP/ERCP with sphincterotomy.  Placed on empiric antibiotics do to a hemorrhagic pseudocyst with communication with the common bile duct.  PICC line placed and started TPN 10/07/11.  Started on CL diet 10/08/11, advanced to [name] diet 10/10/11, which she did not tolerate.  Made NPO again 10/11/11.  Advance to CL diet today.  Wean PCA for pain control.  Encourage oral oxycodone for pain control.  Lipase normalized.   Active Problems:  Hyponatremia  TSH WNL.  Resolved.  Alcohol abuse / depression  Placed on detox IV Ativan protocol per [facility].  No active DTs.  Social worker consultation done 10/10/11 for help with post discharge alcohol counseling.  Psychiatrist consulted per patient request due to underlying depression, assessment  deferred 10/10/11 due to patient request.  (She was in too much pain to participate).  Re-consult psychiatry for assessment of depression and alcohol abuse.  Elevated liver function tests / Fatty liver  Secondary common bile duct obstruction status post ERCP with sphincterotomy.   LFTs declining.  Counseled on need for abstinence.  GERD (gastroesophageal reflux disease)  Continue PPI therapy, change to oral route.  Acute blood loss anemia  Hemoglobin stable after an initial drop.   Code Status: Full Code Family Communication: None at bedside. Disposition Plan: Home, when stable.  Medical Consultants:  Dr. Luisa Hart hung, Gastroenterology  Dr. Darrol Jump, Psychiatry  Other consultants:  Pharmacy: TNA management.  Antibiotics:  Primaxin 10/06/2011--->  Unasyn 10/06/1998--->10/06/11  Rocephin 10/05/2011---> 10/05/2011   Subjective  [name] says her pain is better today.  Nausea is improved.  No vomiting.  Bowels moved yesterday.   Objective   Objective: Filed Vitals:   10/11/11 2012 10/11/11 2038 10/12/11 0439 10/12/11 0526  BP: 117/61   102/63  Pulse: 68   64  Temp: 99 F (37.2 C)   98.5 F (36.9 C)  TempSrc: Oral   Oral  Resp: 15 12 12 12   Height:      Weight:      SpO2: 96% 94% 96% 97%    Intake/Output Summary (Last 24 hours) at 10/12/11 0736 Last data filed at 10/11/11 1700  Gross per 24 hour  Intake      0 ml  Output    750 ml  Net   -750 ml    Exam: Gen:  NAD Cardiovascular:  RRR, No M/R/G Respiratory: Lungs CTAB Gastrointestinal: Abdomen soft, NT/ND with  normal active bowel sounds. Extremities: No C/E/C  Data Reviewed: Basic Metabolic Panel:  Lab 10/12/11 8756 10/11/11 0835 10/09/11 0607 10/08/11 0320 10/06/11 0349  NA 138 137 139 133* 136  K 4.0 4.2 -- -- --  CL 103 103 103 96 101  CO2 27 27 29 27 25   GLUCOSE 150* 154* 124* 173* 83  BUN 11 12 8 6 8   CREATININE 0.49* 0.53 0.54 0.57 0.70  CALCIUM 9.3 9.2 9.3 9.4 9.0    MG 1.9 -- -- 1.8 --  PHOS 3.6 -- -- 3.6 --   GFR Estimated Creatinine Clearance: 76.4 ml/min (by C-G formula based on Cr of 0.49). Liver Function Tests:  Lab 10/12/11 0440 10/09/11 0607 10/08/11 0320 10/06/11 0349  AST 44* 55* 112* 144*  ALT 46* 62* 81* 85*  ALKPHOS 233* 223* 242* 201*  BILITOT 1.1 1.5* 3.4* 5.4*  PROT 6.7 6.8 6.8 6.4  ALBUMIN 2.8* 2.8* 3.0* 2.9*    Lab 10/12/11 0440 10/06/11 0340  LIPASE 26 40  AMYLASE -- --   Coagulation profile  Lab 10/06/11 0349  INR 0.97  PROTIME --    CBC:  Lab 10/12/11 0440 10/09/11 0607 10/08/11 0320 10/06/11 0349  WBC 6.8 8.1 7.7 7.0  NEUTROABS 3.1 -- 5.5 --  HGB 10.3* 11.7* 11.5* 11.6*  HCT 33.2* 36.8 35.8* 36.6  MCV 97.9 95.8 94.5 96.3  PLT 227 271 294 335   TSH:  Ref. Range 09/15/2009 22:50  TSH Latest Range: 0.350-4.500 uIU/mL 2.727 Test methodology is 3rd generation TSH   Microbiology Recent Results (from the past 240 hour(s))  URINE CULTURE     Status: Normal   Collection Time   10/05/11  3:36 AM      Component Value Range Status Comment   Specimen Description URINE, CLEAN CATCH   Final    Special Requests Normal   Final    Culture  Setup Time [id number]   Final    Colony Count 60,000 COLONIES/ML   Final    Culture     Final    Value: Multiple bacterial morphotypes present, none predominant. Suggest appropriate recollection if clinically indicated.   Report Status 10/06/2011 FINAL   Final     Procedures and Diagnostic Studies:  US Abdomen Complete 10/05/2011 IMPRESSION:  1.  Intra and extrahepatic biliary ductal dilatation.  Sludge is identified within the common bile duct.  No ductal stone is seen. 2.  Status post cholecystectomy. 3.  Low attenuation collection in the pancreatic head is seen as on the most recent CT scans compatible with a pseudocyst.  Original Report Authenticated By: Bernadene Bell. D'ALESSIO, M.D.    Mr 3d Recon At Scanner 10/06/2011 IMPRESSION: 1.  Severe dilatation of intrahepatic biliary  tree and common bile duct which abruptly tapers to a more normal caliber as the distal common bile duct passes adjacent to a complex pancreatic head pseudocyst.  No distal ductal stone is identified.  However, there is a large amount of biliary sludge in the distal common bile duct. 2.  The main pancreatic duct is only mildly dilated, measuring 4 mm.  However, as this duct extends into the head of the pancreas it is not visualized once it comes in proximity to the pancreatic head pseudocyst.  This could suggest significant mass effect upon the duct, or communication of the duct with this pseudocyst.  This pancreatic head pseudocyst is highly complex with internal debris (no evidence of internal enhancement to suggest neoplasm at this time). 3.  Smaller  simple pseudocysts in the neck of the pancreas, as above. 4.  Additional incidental findings, as above.  Original Report Authenticated By: Florencia Reasons, M.D.    Dg Ercp With Sphincterotomy 10/06/2011  IMPRESSION: Dilated common bile duct with filling defects in the common hepatic duct which could be due to stones, sludge, or blood.  Sphincterotomy performed.  These images were submitted for radiologic interpretation only. Please see the procedural report for the amount of contrast and the fluoroscopy time utilized.  Original Report Authenticated By: Camelia Phenes, M.D.    Mr Abd W/wo Cm/mrcp 10/06/2011 IMPRESSION: 1.  Severe dilatation of intrahepatic biliary tree and common bile duct which abruptly tapers to a more normal caliber as the distal common bile duct passes adjacent to a complex pancreatic head pseudocyst.  No distal ductal stone is identified.  However, there is a large amount of biliary sludge in the distal common bile duct. 2.  The main pancreatic duct is only mildly dilated, measuring 4 mm.  However, as this duct extends into the head of the pancreas it is not visualized once it comes in proximity to the pancreatic head pseudocyst.  This  could suggest significant mass effect upon the duct, or communication of the duct with this pseudocyst.  This pancreatic head pseudocyst is highly complex with internal debris (no evidence of internal enhancement to suggest neoplasm at this time). 3.  Smaller simple pseudocysts in the neck of the pancreas, as above. 4.  Additional incidental findings, as above.  Original Report Authenticated By: Florencia Reasons, M.D.    Scheduled Meds:    . antiseptic oral rinse  15 mL Mouth Rinse q12n4p  . bisacodyl  10 mg Rectal Once  . docusate  200 mg Oral BID  . folic acid  1 mg Intravenous Daily  . HYDROmorphone PCA 0.3 mg/mL   Intravenous Q4H  . imipenem-cilastatin  500 mg Intravenous Q6H  . insulin aspart  0-15 Units Subcutaneous Q4H  . LORazepam  0-4 mg Intravenous Q12H  . pantoprazole (PROTONIX) IV  40 mg Intravenous Q12H  . thiamine  100 mg Oral Daily   Or  . thiamine  100 mg Intravenous Daily   Continuous Infusions:    . TPN (CLINIMIX) +/- additives 80 mL/hr at 10/10/11 1731  . TPN [facility]) +/- additives 80 mL/hr at 10/11/11 1724      LOS: 7 days   Hillery Aldo, MD Pager [phone]  10/12/2011, 7:36 AM

## 2011-10-12 NOTE — Consult Note (Signed)
Patient Identification:  [name] Date of Evaluation:  10/12/2011  Reason for Consult: Alcohol and depression  Referring Provider: Dr. Darnelle Catalan History of Present Illness:pt reports many years of drinking Vodka.  She knows she has pancreatitis, recurrent and has been to rehab programs.  She has been abstinent for 15 years and one day took just one drink.    Past Psychiatric History:depression  Past Medical History:     Past Medical History  Diagnosis Date  . GERD (gastroesophageal reflux disease)   . Rosacea   . Elevated liver function tests   . Wears glasses   . Chronic diarrhea   . Pancreatitis 01/2010    2011 admission was second admission, 3rd in 02/2011  . Alcohol abuse     H/o withdrawal   . PONV (postoperative nausea and vomiting)        Past Surgical History  Procedure Date  . Cholecystectomy   . Shoulder surgery [id number]  . Diagnostic mammogram 2008  . Tubal ligation   . Colonoscopy Never  . Esophagogastroduodenoscopy 03/21/2011    Procedure: ESOPHAGOGASTRODUODENOSCOPY (EGD);  Surgeon: Yancey Flemings, MD;  Location[facility] ENDOSCOPY;  Service: Endoscopy;  Laterality: N/A;  Patient may need to be done at bedside tomorrow depending on status  . Ercp 10/06/2011    Procedure: ENDOSCOPIC RETROGRADE CHOLANGIOPANCREATOGRAPHY (ERCP);  Surgeon: Theda Belfast, MD;  Location: [facility] ENDOSCOPY;  Service: Endoscopy;  Laterality: N/A;    Allergies:  Allergies  Allergen Reactions  . Ciprofloxacin     REACTION: nausea    Current Medications:  Prior to Admission medications   Medication Sig Start Date End Date Taking? Authorizing Provider  folic acid (FOLVITE) 1 MG tablet Take 1 tablet (1 mg total) by mouth daily. 09/29/11 09/28/12 Yes Nishant Dhungel, MD  HYDROmorphone (DILAUDID) 2 MG tablet Take 2 mg by mouth 3 (three) times daily.   Yes Historical Provider, MD  LORazepam (ATIVAN) 1 MG tablet Take 1 mg by mouth 2 (two) times daily as needed. Anxiety   Yes Historical Provider, MD    metoCLOPramide (REGLAN) 5 MG tablet Take 1 tablet (5 mg total) by mouth every 6 (six) hours as needed (nausea). 09/29/11 10/09/11 Yes Nishant Dhungel, MD  pantoprazole (PROTONIX) 40 MG tablet Take 1 tablet (40 mg total) by mouth 2 (two) times daily before a meal. 09/29/11 09/28/12 Yes Nishant Dhungel, MD  senna-docusate (SENOKOT-S) 8.6-50 MG per tablet Take 1 tablet by mouth daily. 09/29/11 09/28/12 Yes Nishant Dhungel, MD  thiamine 100 MG tablet Take 1 tablet (100 mg total) by mouth daily. 09/29/11 09/28/12 Yes Nishant Dhungel, MD    Social History:    reports that she quit smoking about 33 years ago. She has never used smokeless tobacco. She reports that she drinks about 1.5 - 2 ounces of alcohol per week. She reports that she does not use illicit drugs.   Family History:    Family History  Problem Relation Age of Onset  . Stroke Mother   . Heart disease Father   . Heart failure Father   . 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  AXIS IV occupational problems, other psychosocial or environmental problems, problems related to social environment, problems with access to health care services and problems with primary support group  AXIS V 61-70 mild symptoms   Assessment/Plan: Discussed with Dr. Darnelle Catalan Pt is evaluated ~11:00 am  Pt is sitting up  She says she has repeated bouts of pancreatitis from drinking Vodka.  She has been to rehab and  considers returning to [employer] [facility]  She is unemployed and is unable to consider going to a rehab center. She is invited to consider other program available with emphasis on therapy sessions.  RECOMMENDATION:  1. Consider starting Wellbutrin, bupropion, 75mg   Daily; as tolerated titrate to XL 150 mg daily  2. When no longer taking pain medication, specifically opioids, consider Revia naltrexone 50 mg daily.  3. When medically stable, refer pt to psychiatric services accessible to her and discharge to home.  4. Will follow pt.  Rylan Kaufmann J. Ferol Luz, MD Psychiatrist   10/12/2011 7:35 PM

## 2011-10-12 NOTE — Progress Notes (Signed)
Subjective: Feeling better.  She was not able to tolerate a full liquid diet.  Objective: Vital signs in last 24 hours: Temp:  [97.9 F (36.6 C)-99 F (37.2 C)] 98.5 F (36.9 C) (06/24 0526) Pulse Rate:  [64-85] 64  (06/24 0526) Resp:  [7-15] 12  (06/24 0526) BP: (102-121)/(61-65) 102/63 mmHg (06/24 0526) SpO2:  [92 %-99 %] 97 % (06/24 0526) Last BM Date: 10/04/11  Intake/Output from previous day: 06/23 0701 - 06/24 0700 In: 0  Out: 750 [Urine:750] Intake/Output this shift:    General appearance: alert and no distress GI: soft, non-tender; bowel sounds normal; no masses,  no organomegaly  Lab Results:  [name] 10/12/11 0440  WBC 6.8  HGB 10.3*  HCT 33.2*  PLT 227   BMET  Basename 10/12/11 0440 10/11/11 0835  NA 138 137  K 4.0 4.2  CL 103 103  CO2 27 27  GLUCOSE 150* 154*  BUN 11 12  CREATININE 0.49* 0.53  CALCIUM 9.3 9.2   LFT  Basename 10/12/11 0440  PROT 6.7  ALBUMIN 2.8*  AST 44*  ALT 46*  ALKPHOS 233*  BILITOT 1.1  BILIDIR --  IBILI --   PT/INR No results found for this basename: LABPROT:2,INR:2 in the last 72 hours Hepatitis Panel No results found for this basename: HEPBSAG,HCVAB,HEPAIGM,HEPBIGM in the last 72 hours C-Diff No results found for this basename: CDIFFTOX:3 in the last 72 hours Fecal Lactopherrin No results found for this basename: FECLLACTOFRN in the last 72 hours  Studies/Results: No results found.  Medications:  Scheduled:   . antiseptic oral rinse  15 mL Mouth Rinse q12n4p  . bisacodyl  10 mg Rectal Once  . docusate  200 mg Oral BID  . folic acid  1 mg Oral Daily  . HYDROmorphone PCA 0.3 mg/mL   Intravenous Q4H  . LORazepam  0-4 mg Intravenous Q12H  . metoCLOPramide  5 mg Oral TID AC & HS  . pantoprazole  40 mg Oral Q0600  . thiamine  100 mg Oral Daily  . DISCONTD: folic acid  1 mg Intravenous Daily  . DISCONTD: HYDROmorphone PCA 0.3 mg/mL   Intravenous Q4H  . DISCONTD: imipenem-cilastatin  500 mg Intravenous Q6H   . DISCONTD: insulin aspart  0-15 Units Subcutaneous Q4H  . DISCONTD: pantoprazole (PROTONIX) IV  40 mg Intravenous Q12H  . DISCONTD: thiamine  100 mg Intravenous Daily   Continuous:   . TPN (CLINIMIX) +/- additives 80 mL/hr at 10/10/11 1731  . TPN (CLINIMIX) +/- additives 80 mL/hr at 10/11/11 1724    Assessment/Plan: 1) ETOH pancreatitis. 2) Hemobilia. 3) Hemorrhagic pseudocyst.   Overall she is improving.  She can be advanced with her diet as tolerated.  HGB appears to be stable.  No fever and she has been on Primaxin for almost one week.  Plan: 1) D/C Primaxin. 2) Probably D/C PCA tomorrow and then go on PRN pain medications. 3) Okay to advance diet.  LOS: 7 days   [name] D 10/12/2011, 9:52 AM

## 2011-10-13 DIAGNOSIS — R7402 Elevation of levels of lactic acid dehydrogenase (LDH): Secondary | ICD-10-CM

## 2011-10-13 DIAGNOSIS — K859 Acute pancreatitis without necrosis or infection, unspecified: Secondary | ICD-10-CM

## 2011-10-13 DIAGNOSIS — R740 Nonspecific elevation of levels of transaminase and lactic acid dehydrogenase [LDH]: Secondary | ICD-10-CM

## 2011-10-13 DIAGNOSIS — F1010 Alcohol abuse, uncomplicated: Secondary | ICD-10-CM

## 2011-10-13 DIAGNOSIS — F329 Major depressive disorder, single episode, unspecified: Secondary | ICD-10-CM

## 2011-10-13 DIAGNOSIS — F3289 Other specified depressive episodes: Secondary | ICD-10-CM

## 2011-10-13 MED ORDER — BOOST / RESOURCE BREEZE PO LIQD
1.0000 | Freq: Two times a day (BID) | ORAL | Status: DC
  Administered 2011-10-13 – 2011-10-14 (×2): 1 via ORAL

## 2011-10-13 NOTE — Progress Notes (Signed)
Subjective: Feeling okay.  Pain is tolerable, but she did not drink any full liquids.  Objective: Vital signs in last 24 hours: Temp:  [97.8 F (36.6 C)-98.1 F (36.7 C)] 98.1 F (36.7 C) (06/25 1312) Pulse Rate:  [66-73] 70  (06/25 1312) Resp:  [17-18] 18  (06/25 1312) BP: (97-112)/(51-69) 109/69 mmHg (06/25 1312) SpO2:  [95 %-98 %] 98 % (06/25 1312) Last BM Date: 10/04/11  Intake/Output from previous day: 06/24 0701 - 06/25 0700 In: 360 [P.O.:360] Out: 1850 [Urine:1850] Intake/Output this shift: Total I/O In: -  Out: 550 [Urine:550]  General appearance: alert and no distress GI: tender in the epigastric region  Lab Results:  [facility] 10/12/11 0440  WBC 6.8  HGB 10.3*  HCT 33.2*  PLT 227   BMET  Basename 10/12/11 0440 10/11/11 0835  NA 138 137  K 4.0 4.2  CL 103 103  CO2 27 27  GLUCOSE 150* 154*  BUN 11 12  CREATININE 0.49* 0.53  CALCIUM 9.3 9.2   LFT  Basename 10/12/11 0440  PROT 6.7  ALBUMIN 2.8*  AST 44*  ALT 46*  ALKPHOS 233*  BILITOT 1.1  BILIDIR --  IBILI --   PT/INR No results found for this basename: LABPROT:2,INR:2 in the last 72 hours Hepatitis Panel No results found for this basename: HEPBSAG,HCVAB,HEPAIGM,HEPBIGM in the last 72 hours C-Diff No results found for this basename: CDIFFTOX:3 in the last 72 hours Fecal Lactopherrin No results found for this basename: FECLLACTOFRN in the last 72 hours  Studies/Results: No results found.  Medications:  Scheduled:   . antiseptic oral rinse  15 mL Mouth Rinse q12n4p  . buPROPion  75 mg Oral Daily  . docusate  200 mg Oral BID  . feeding supplement  1 Container Oral BID BM  . folic acid  1 mg Oral Daily  . metoCLOPramide  5 mg Oral TID AC & HS  . pantoprazole  40 mg Oral Q0600  . thiamine  100 mg Oral Daily   Continuous:   . TPN (CLINIMIX) +/- additives 40 mL/hr at 10/12/11 1220    Assessment/Plan: 1) ETOH pancreatitis.  2) Hemobilia.  3) Hemorrhagic  pseudocyst.   Plan: 1) Advance diet as tolerated and PRN pain meds. 2) Signing off.  3) Follow up in the office in one month.  LOS: 8 days   [name] D 10/13/2011, 3:56 PM

## 2011-10-13 NOTE — Progress Notes (Addendum)
CSW went to consult the patient in her room. The patient was sleep. CSW called the son Reuel Boom, [phone].  CSW consult about any further need from social work, assistance with addiction treatment and level of family support.  Currently the son feels there is not much he can do, his is being supportive and helping her be involved with AA. His only concerns is with his mother being financially stable enough to pay her bills. CSW will continue to follow up on treatment needs and see what services the patient would like to link to after discharge.   Kayleen Memos. Leighton Ruff [phone]

## 2011-10-13 NOTE — Progress Notes (Signed)
PROGRESS NOTE  [name] WUX:[id number] DOB: 12/18/1955 DOA: 10/05/2011 PCP: Thayer Headings, MD  Brief narrative: [name] is a 56 year old female with past medical history of alcohol abuse and prior history of pancreatitis and pancreatic pseudocyst in the head of the who was admitted on 10/05/2011 with recurrent pancreatitis. Because of elevated liver function tests, a GI consultation was requested and an MRCP was done 10/06/2011 which showed dilated common bile duct with biliary sludge. She subsequently underwent an ERCP with sphincterotomy on 10/06/2011. There were clots and a small stone noted in her common bile duct, which were cleared during the procedure. The bleeding was felt to be due to a communication between the pseudocyst and the common bile duct.  The patient was treated TNA and PCA dilaudid for pain control, which have now been weaned.  We are attempting to advance her diet.  Interim History: [name] was stable overnight.    Assessment/Plan: Principal Problem:  *Pancreatitis caused by a combination of alcoholism, obstruction of the common bile duct by blood clots and a small stone, and extrinsic compression of the common bile duct by pancreatic pseudocyst  Status post MRCP/ERCP with sphincterotomy.  Placed on empiric antibiotics do to a hemorrhagic pseudocyst with communication with the common bile duct.  PICC line placed and started TPN 10/07/11.  Started on CL diet 10/08/11, advanced to [name] diet 10/10/11, which she did not tolerate.  Made NPO again 10/11/11.  Advanced to CL diet 10/12/11 but intake has been poor.  Now on bland diet but has not yet tried solid foods.  Weaned PCA for pain control 10/12/11.  Encourage oral oxycodone for pain control.  Lipase normalized.   Active Problems:  Hyponatremia  TSH WNL.  Resolved.  Alcohol abuse / depression  Placed on detox IV Ativan protocol per [facility].  No active DTs.  Social worker consultation done 10/10/11 for  help with post discharge alcohol counseling.  Psychiatrist consulted per patient request due to underlying depression, assessment deferred 10/10/11 due to patient request.  (She was in too much pain to participate).  Seen by psychiatrist who recommended starting Wellbutrin 75 mg daily (titrate up to 150 mg in 1 week if tolerates) and consideration of treatment with [facility] when off pain medications to help lessen the craving for alcohol.  SW also gave patient information regarding addiction treatment.  Elevated liver function tests / Fatty liver  Secondary common bile duct obstruction status post ERCP with sphincterotomy.   LFTs declining.  Counseled on need for abstinence.  GERD (gastroesophageal reflux disease)  Continue PPI therapy, change to oral route.  Acute blood loss anemia  Hemoglobin stable after an initial drop.   Code Status: Full Code Family Communication: None at bedside. Disposition Plan: Home, when stable.  Medical Consultants:  Dr. Luisa Hart hung, Gastroenterology  Dr. Darrol Jump, Psychiatry  Other consultants:  Pharmacy: TNA management.  Antibiotics:  Primaxin 10/06/2011--->  Unasyn 10/06/1998--->10/06/11  Rocephin 10/05/2011---> 10/05/2011   Subjective  [name] says her pain is improving.  Still has nausea but no vomiting.  Poor appetite.   Objective   Objective: Filed Vitals:   10/13/11 0536 10/13/11 0936 10/13/11 1104 10/13/11 1312  BP: 97/62 112/67 111/67 109/69  Pulse: 67 66 67 70  Temp: 98 F (36.7 C) 97.8 F (36.6 C) 98 F (36.7 C) 98.1 F (36.7 C)  TempSrc: Oral Oral Oral Oral  Resp: 17 17 17 18   Height:      Weight:      SpO2: 96%  95% 96% 98%    Intake/Output Summary (Last 24 hours) at 10/13/11 1716 Last data filed at 10/13/11 1121  Gross per 24 hour  Intake    240 ml  Output   1800 ml  Net  -1560 ml    Exam: Gen:  NAD Cardiovascular:  RRR, No M/R/G Respiratory: Lungs CTAB Gastrointestinal: Abdomen soft,  NT/ND with normal active bowel sounds. Extremities: No C/E/C  Data Reviewed: Basic Metabolic Panel:  Lab 10/12/11 1610 10/11/11 0835 10/09/11 0607 10/08/11 0320  NA 138 137 139 133*  K 4.0 4.2 -- --  CL 103 103 103 96  CO2 27 27 29 27   GLUCOSE 150* 154* 124* 173*  BUN 11 12 8 6   CREATININE 0.49* 0.53 0.54 0.57  CALCIUM 9.3 9.2 9.3 9.4  MG 1.9 -- -- 1.8  PHOS 3.6 -- -- 3.6   GFR Estimated Creatinine Clearance: 76.4 ml/min (by C-G formula based on Cr of 0.49). Liver Function Tests:  Lab 10/12/11 0440 10/09/11 0607 10/08/11 0320  AST 44* 55* 112*  ALT 46* 62* 81*  ALKPHOS 233* 223* 242*  BILITOT 1.1 1.5* 3.4*  PROT 6.7 6.8 6.8  ALBUMIN 2.8* 2.8* 3.0*    Lab 10/12/11 0440  LIPASE 26  AMYLASE --   CBC:  Lab 10/12/11 0440 10/09/11 0607 10/08/11 0320  WBC 6.8 8.1 7.7  NEUTROABS 3.1 -- 5.5  HGB 10.3* 11.7* 11.5*  HCT 33.2* 36.8 35.8*  MCV 97.9 95.8 94.5  PLT 227 271 294   TSH:  Ref. Range 09/15/2009 22:50  TSH Latest Range: 0.350-4.500 uIU/mL 2.727 Test methodology is 3rd generation TSH   Microbiology Recent Results (from the past 240 hour(s))  URINE CULTURE     Status: Normal   Collection Time   10/05/11  3:36 AM      Component Value Range Status Comment   Specimen Description URINE, CLEAN CATCH   Final    Special Requests Normal   Final    Culture  Setup Time [id number]   Final    Colony Count 60,000 COLONIES/ML   Final    Culture     Final    Value: Multiple bacterial morphotypes present, none predominant. Suggest appropriate recollection if clinically indicated.   Report Status 10/06/2011 FINAL   Final     Procedures and Diagnostic Studies:  US Abdomen Complete 10/05/2011 IMPRESSION:  1.  Intra and extrahepatic biliary ductal dilatation.  Sludge is identified within the common bile duct.  No ductal stone is seen. 2.  Status post cholecystectomy. 3.  Low attenuation collection in the pancreatic head is seen as on the most recent CT scans compatible with a  pseudocyst.  Original Report Authenticated By: Bernadene Bell. D'ALESSIO, M.D.    Mr 3d Recon At Scanner 10/06/2011 IMPRESSION: 1.  Severe dilatation of intrahepatic biliary tree and common bile duct which abruptly tapers to a more normal caliber as the distal common bile duct passes adjacent to a complex pancreatic head pseudocyst.  No distal ductal stone is identified.  However, there is a large amount of biliary sludge in the distal common bile duct. 2.  The main pancreatic duct is only mildly dilated, measuring 4 mm.  However, as this duct extends into the head of the pancreas it is not visualized once it comes in proximity to the pancreatic head pseudocyst.  This could suggest significant mass effect upon the duct, or communication of the duct with this pseudocyst.  This pancreatic head pseudocyst is highly complex with internal  debris (no evidence of internal enhancement to suggest neoplasm at this time). 3.  Smaller simple pseudocysts in the neck of the pancreas, as above. 4.  Additional incidental findings, as above.  Original Report Authenticated By: Florencia Reasons, M.D.    Dg Ercp With Sphincterotomy 10/06/2011  IMPRESSION: Dilated common bile duct with filling defects in the common hepatic duct which could be due to stones, sludge, or blood.  Sphincterotomy performed.  These images were submitted for radiologic interpretation only. Please see the procedural report for the amount of contrast and the fluoroscopy time utilized.  Original Report Authenticated By: Camelia Phenes, M.D.    Mr Abd W/wo Cm/mrcp 10/06/2011 IMPRESSION: 1.  Severe dilatation of intrahepatic biliary tree and common bile duct which abruptly tapers to a more normal caliber as the distal common bile duct passes adjacent to a complex pancreatic head pseudocyst.  No distal ductal stone is identified.  However, there is a large amount of biliary sludge in the distal common bile duct. 2.  The main pancreatic duct is only mildly dilated,  measuring 4 mm.  However, as this duct extends into the head of the pancreas it is not visualized once it comes in proximity to the pancreatic head pseudocyst.  This could suggest significant mass effect upon the duct, or communication of the duct with this pseudocyst.  This pancreatic head pseudocyst is highly complex with internal debris (no evidence of internal enhancement to suggest neoplasm at this time). 3.  Smaller simple pseudocysts in the neck of the pancreas, as above. 4.  Additional incidental findings, as above.  Original Report Authenticated By: Florencia Reasons, M.D.    Scheduled Meds:    . antiseptic oral rinse  15 mL Mouth Rinse q12n4p  . buPROPion  75 mg Oral Daily  . docusate  200 mg Oral BID  . feeding supplement  1 Container Oral BID BM  . folic acid  1 mg Oral Daily  . metoCLOPramide  5 mg Oral TID AC & HS  . pantoprazole  40 mg Oral Q0600  . thiamine  100 mg Oral Daily   Continuous Infusions:    . TPN (CLINIMIX) +/- additives 40 mL/hr at 10/12/11 1220      LOS: 8 days   Hillery Aldo, MD Pager [phone]  10/13/2011, 5:16 PM

## 2011-10-13 NOTE — Progress Notes (Signed)
Nutrition Follow-up  Diet Order:  Parke Simmers diet  Patient tolerating clears. Diet advanced to bland diet. Patient with poor PO intake per RN, 20% of clears. Patient reported she has not been eating much. She reported she does not have an appetite. She reported nausea. Patient agreed to try resource breeze.    Meds: Scheduled Meds:   . antiseptic oral rinse  15 mL Mouth Rinse q12n4p  . buPROPion  75 mg Oral Daily  . docusate  200 mg Oral BID  . folic acid  1 mg Oral Daily  . metoCLOPramide  5 mg Oral TID AC & HS  . pantoprazole  40 mg Oral Q0600  . thiamine  100 mg Oral Daily   Continuous Infusions:   . TPN (CLINIMIX) +/- additives 40 mL/hr at 10/12/11 1220   PRN Meds:.LORazepam, naloxone, ondansetron (ZOFRAN) IV, oxyCODONE, sodium chloride, DISCONTD: diphenhydrAMINE, DISCONTD: diphenhydrAMINE  Labs:  CMP     Component Value Date/Time   NA 138 10/12/2011 0440   K 4.0 10/12/2011 0440   CL 103 10/12/2011 0440   CO2 27 10/12/2011 0440   GLUCOSE 150* 10/12/2011 0440   BUN 11 10/12/2011 0440   CREATININE 0.49* 10/12/2011 0440   CALCIUM 9.3 10/12/2011 0440   PROT 6.7 10/12/2011 0440   ALBUMIN 2.8* 10/12/2011 0440   AST 44* 10/12/2011 0440   ALT 46* 10/12/2011 0440   ALKPHOS 233* 10/12/2011 0440   BILITOT 1.1 10/12/2011 0440   GFRNONAA >90 10/12/2011 0440   GFRAA >90 10/12/2011 0440     Intake/Output Summary (Last 24 hours) at 10/13/11 1504 Last data filed at 10/13/11 1121  Gross per 24 hour  Intake    240 ml  Output   1800 ml  Net  -1560 ml    Weight Status:  No new weight.  Re-estimated needs remain the same: 1775-2050 kcal and 105-125 grams of protein  Nutrition Dx:  Inadequate oral intake, Status: continue, Ongoing.   Goal: 1. TNA to meet > 90% of estimated energy needs. -discontinue. 2. PO intake > 75% at meals and supplements.  3. Positive tolerance of PO diet.  Intervention:  Will order patient resource breeze BID.   Monitor: diet tolerance, weights, labs,  I/O's   Adron Bene Pager #:  [phone]

## 2011-10-14 DIAGNOSIS — K859 Acute pancreatitis without necrosis or infection, unspecified: Secondary | ICD-10-CM

## 2011-10-14 DIAGNOSIS — F1010 Alcohol abuse, uncomplicated: Secondary | ICD-10-CM

## 2011-10-14 DIAGNOSIS — F329 Major depressive disorder, single episode, unspecified: Secondary | ICD-10-CM

## 2011-10-14 DIAGNOSIS — R740 Nonspecific elevation of levels of transaminase and lactic acid dehydrogenase [LDH]: Secondary | ICD-10-CM

## 2011-10-14 MED ORDER — ONDANSETRON 8 MG PO TBDP: 4.0000 mg | ORAL_TABLET | Freq: Three times a day (TID) | ORAL | Status: AC | PRN

## 2011-10-14 MED ORDER — BUPROPION HCL 75 MG PO TABS: 75.0000 mg | ORAL_TABLET | Freq: Every day | ORAL | Status: DC

## 2011-10-14 NOTE — Progress Notes (Signed)
Patient to be discharged to home, all discharge medications and instructions reviewed and questions answered. Patient verbalizes understanding.  To be transported to vehicle via wheelchair when ride arrives.

## 2011-10-14 NOTE — Discharge Summary (Signed)
HOSPITAL DISCHARGE SUMMARY  [name]  MRN: [id number]  DOB:05-20-1955  Date of Admission: 10/05/2011 Date of Discharge: 10/14/2011         LOS: 9 days   Attending Physician:  [name]  Patient's PCP:  [name] Headings, MD  Consults: Treatment Team:  [name] Settle, MD  Discharge Diagnoses: Present on Admission:  .Alcohol abuse .Pancreatitis caused by [name] combination of alcoholism, obstruction of the common bile duct by blood clots and [name] small stone, and extrinsic compression of the common bile duct by pancreatic pseudocyst .Elevated liver function tests .Acute blood loss anemia .GERD (gastroesophageal reflux disease) .Depression .Fatty liver   Medication List  As of 10/14/2011  5:48 PM   STOP taking these medications         metoCLOPramide 5 MG tablet      oxyCODONE 5 MG immediate release tablet         TAKE these medications         buPROPion 75 MG tablet   Commonly known as: WELLBUTRIN   Take 1 tablet (75 mg total) by mouth daily.      folic acid 1 MG tablet   Commonly known as: FOLVITE   Take 1 tablet (1 mg total) by mouth daily.      HYDROmorphone 2 MG tablet   Commonly known as: DILAUDID   Take 2 mg by mouth 3 (three) times daily.      LORazepam 1 MG tablet   Commonly known as: ATIVAN   Take 1 mg by mouth 2 (two) times daily as needed. Anxiety      ondansetron 8 MG disintegrating tablet   Commonly known as: ZOFRAN-ODT   Take 0.5-1 tablets (4-8 mg total) by mouth every 8 (eight) hours as needed for nausea.      pantoprazole 40 MG tablet   Commonly known as: PROTONIX   Take 1 tablet (40 mg total) by mouth 2 (two) times daily before [name] meal.      senna-docusate 8.6-50 MG per tablet   Commonly known as: Senokot-S   Take 1 tablet by mouth daily.      thiamine 100 MG tablet   Take 1 tablet (100 mg total) by mouth daily.             Brief Admission History: [name] is [name] 56 year old female with past medical history of  alcohol abuse and prior history of pancreatitis and pancreatic pseudocyst in the head of the who was admitted on 10/05/2011 with recurrent pancreatitis. Because of elevated liver function tests, [name] GI consultation was requested and an MRCP was done 10/06/2011 which showed dilated common bile duct with biliary sludge. She subsequently underwent an ERCP with sphincterotomy on 10/06/2011. There were clots and [name] small stone noted in her common bile duct, which were cleared during the procedure. The bleeding was felt to be due to [name] communication between the pseudocyst and the common bile duct. The patient was treated TNA and PCA dilaudid for pain control, which have now been weaned. We are attempting to advance her diet.  Hospital Course: Present on Admission:  .Alcohol abuse .Pancreatitis caused by [name] combination of alcoholism, obstruction of the common bile duct by blood clots and [name] small stone, and extrinsic compression of the common bile duct by pancreatic pseudocyst .Elevated liver function tests .Acute blood loss anemia .GERD (gastroesophageal reflux disease) .Depression .Fatty liver   1. Acute pancreatitis with pseudocyst: Patient had recurrent acute pancreatitis, and is believed to be caused by  alcohol. Patient did have CT scan of abdomen and pelvis an MRCP and did NOT mention any intrapancreatic duct ectasia consistent with chronic pancreatitis. Interestingly the pancreatitis thought to be caused by combination of alcohol and obstruction of the common bile duct by [name] small stone and blood clots, extrinsic compression from the common bile duct by pancreatic pseudocyst cannot be excluded also as cause of the acute pancreatitis. After the admission to the hospital patient was treated conservatively with n.p.o. status, gastroenterology service was consulted. They recommended MRCP which showed evidence of small gallstone in the CBD. ERCP was done on 10/06/2011 by Dr. Elnoria [name]. The ERCP showed small stone and  bleeding from the pancreatic ampulla prior to any manipulation. 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 Patient has her diet advanced very slowly and carefully. Patient tolerated the diet very well and then she was discharged today.  2. Choledocholithiasis: As mentioned above ERCP with sphincterotomy was done by Dr. Azzie [name] on 10/06/2011 with removal of CBD stone and some debris and blood clots.  3. Alcohol abuse: Patient mentioned that she's been sober for the past 15 years, and [name] day she started to drink again she had the acute pancreatitis and had to come to the hospital. Psychiatry was consulted because she had also an underlying depression, psychiatry recommended to start Wellbutrin 75 mg and titrate up to 150 mg. The clinical social worker was consulted and she counseled the patient regarding patient drinking.  4. Hyponatremia: Patient did have hyponatremia when she came in, this was thought to be secondary to dehydration and this is resolved completely after IV fluids.  5. Elevated liver enzyme: This is likely secondary to CBD obstruction secondary to small stone/blood clots. The LFTs were declined at the time of discharge. Patient also have fatty liver which might be contributing to the elevated LFTs.  6. 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  Day of Discharge BP 109/70  Pulse 79  Temp 98 F (36.7 C) (Oral)  Resp 18  Ht 5\' 7"  (1.702 m)  Wt 69.718 kg (153 lb 11.2 oz)  BMI 24.07 kg/m2  SpO2 98% Physical Exam: GEN: No acute distress, cooperative with exam PSYCH: alert and oriented x4; does not appear anxious does not appear depressed; affect is normal  HEENT: Mucous membranes pink and anicteric;  Mouth: without oral thrush or lesions Eyes: PERRLA; EOM intact;  Neck: no cervical lymphadenopathy nor thyromegaly or carotid bruit; no JVD;  CHEST WALL: No  tenderness, symmetrical to breathing bilaterally CHEST: Normal respiration, clear to auscultation bilaterally  HEART: Regular rate and rhythm; no murmurs, rubs or gallops, S1 and S2 heard  BACK: No kyphosis or scoliosis; no CVA tenderness  ABDOMEN:  soft non-tender; no masses, no organomegaly, normal abdominal bowel sounds; no pannus; no intertriginous candida.  EXTREMITIES: No bone or joint deformity; no edema; no ulcerations.  PULSES: 2+ and symmetric, neurovascularity is intact SKIN: Normal hydration no rash or ulceration, no flushing or suspicious lesions  CNS: Cranial nerves 2-12 grossly intact no focal neurologic deficit, coordination is intact gait not tested    No results found for this or any previous visit (from the past 24 hour(s)).  Disposition: Home   Follow-up Appts: Discharge Orders    Future Orders Please Complete By Expires   Increase activity slowly         Follow-up Information    Follow up with [name] Headings, MD in  1 week.   Contact information:   [address], [address] [place] [address] [phone]          I spent 40 minutes completing paperwork and coordinating discharge efforts.  SignedClydia Llano [name] 10/14/2011, 5:48 PM

## 2011-10-14 NOTE — Progress Notes (Signed)
Clinical Social Work Department CLINICAL SOCIAL WORK PSYCHIATRY SERVICE LINE ASSESSMENT 10/14/2011  Patient:  [name]  Account:  [id number]  Admit Date:  10/05/2011  Clinical Social Worker:  Doroteo Glassman  Date/Time:  10/14/2011 04:29 PM Referred by:    Date referred:  10/14/2011 Reason for Referral  Psychosocial assessment   Presenting Symptoms/Problems (In the person's/family's own words):   Pt reports that she's been drinking since 2008.    Abuse/Neglect/Trauma Comments:   Psychiatric History (check all that apply)  Inpatient/hospitilization   Psychiatric medications:  Current Mental Health Hospitalizations/Previous Mental Health History:   Current provider:   Place and Date:   Current Medications:   See H&P   Previous Impatient Admission/Date/Reason:   Pt reports inpt tx at [facility] and [facility] for [employer].   Emotional Health / Current Symptoms    Suicide/Self Harm  Suicide attempt in past (date/description)   Suicide attempt in the past:   Reports that she unintentionally OD last year.   Other harmful behavior:   Psychotic/Dissociative Symptoms  Other - See comment   Other Psychotic/Dissociative Symptoms:    Attention/Behavioral Symptoms  Within Normal Limits   Other Attention / Behavioral Symptoms:    Cognitive Impairment  Within Normal Limits   Other Cognitive Impairment:    Mood and Adjustment  DEPRESSION    Stress, Anxiety, Trauma, Any Recent Loss/Stressor  Other - See comment   Anxiety (frequency):   Phobia (specify):   Compulsive behavior (specify):   Obsessive behavior (specify):   Other:   Unemployed   Substance Abuse/Use  Current substance use   SBIRT completed (please refer for detailed history):    Self-reported substance use:   Over 2 weeks.  Drink 8-10 oz of liquor daily.   Urinary Drug Screen Completed:  Y Alcohol level:   less than 11    Environmental/Housing/Living Arrangement  Stable housing   Who is in  the home:   None   Emergency contact:  Son    Patient's Strengths and Goals (patient's own words):   Clinical Social Worker's Interpretive Summary:   Pt. reports that she was sober for 15 years until being laid of in 2008.  She began drinking in 2008 and hasn't stopped.  Pt understands how it's affecting her body but states honestly that she doesn't think she'll stop for any length of time.    Pt is interested in inpt tx.  Provided her with [facility] information and scheduled an appt for her for an Ax on October 20, 2011 at 800.  Provided Pt with [facility] information for outpt tx.  Pt intends to go to AA.    Pt reports receiving inpt tx for ETOH at [facility] and [facility].    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 She states that she did land a permanent position in [place] but was let go due to having to take time off to care for her mom and due to having to take time off because she was hung over.    Pt thanked CSW for time and assistance.   Disposition:  Psych Clinical Social Worker signing off  [place], [place] Clinical Social Work [phone]

## 2011-10-14 NOTE — Plan of Care (Signed)
Problem: Food- and Nutrition-Related Knowledge Deficit (NB-1.1) Goal: Nutrition education Formal process to instruct or train a patient/client in a skill or to impart knowledge to help patients/clients voluntarily manage or modify food choices and eating behavior to maintain or improve health.  Outcome: Completed/Met Date Met:  10/14/11 Knowledge deficit resolved 6/26 with diet education.   Comments:  Discussed and provided handout obtained from ADA nutrition care manual for pancreatitis nutrition therapy. Patient was eager to listen and asked good questions. I have also educated the patient on food label reading. She was without any further nutrition related question and verbalized understanding of the nutrition information provided. Expect fair compliance.   RD available for nutrition needs.   [name] [facility] [phone]

## 2011-12-26 ENCOUNTER — Emergency Department (HOSPITAL_COMMUNITY): Payer: Self-pay

## 2011-12-26 ENCOUNTER — Encounter (HOSPITAL_COMMUNITY): Payer: Self-pay | Admitting: *Deleted

## 2011-12-26 ENCOUNTER — Inpatient Hospital Stay (HOSPITAL_COMMUNITY)
Admission: EM | Admit: 2011-12-26 | Discharge: 2012-02-09 | DRG: 326 | Disposition: A | Discharge status: Home Health | Payer: MEDICAID | Attending: General Surgery | Admitting: General Surgery

## 2011-12-26 DIAGNOSIS — G9340 Encephalopathy, unspecified: Secondary | ICD-10-CM | POA: Diagnosis not present

## 2011-12-26 DIAGNOSIS — D649 Anemia, unspecified: Secondary | ICD-10-CM

## 2011-12-26 DIAGNOSIS — D72829 Elevated white blood cell count, unspecified: Secondary | ICD-10-CM

## 2011-12-26 DIAGNOSIS — J9600 Acute respiratory failure, unspecified whether with hypoxia or hypercapnia: Secondary | ICD-10-CM

## 2011-12-26 DIAGNOSIS — F1010 Alcohol abuse, uncomplicated: Secondary | ICD-10-CM

## 2011-12-26 DIAGNOSIS — J90 Pleural effusion, not elsewhere classified: Secondary | ICD-10-CM | POA: Diagnosis not present

## 2011-12-26 DIAGNOSIS — K7200 Acute and subacute hepatic failure without coma: Secondary | ICD-10-CM | POA: Diagnosis not present

## 2011-12-26 DIAGNOSIS — K859 Acute pancreatitis without necrosis or infection, unspecified: Secondary | ICD-10-CM

## 2011-12-26 DIAGNOSIS — F1021 Alcohol dependence, in remission: Secondary | ICD-10-CM

## 2011-12-26 DIAGNOSIS — F3289 Other specified depressive episodes: Secondary | ICD-10-CM

## 2011-12-26 DIAGNOSIS — K3533 Acute appendicitis with perforation and localized peritonitis, with abscess: Principal | ICD-10-CM | POA: Diagnosis present

## 2011-12-26 DIAGNOSIS — E876 Hypokalemia: Secondary | ICD-10-CM | POA: Diagnosis not present

## 2011-12-26 DIAGNOSIS — E873 Alkalosis: Secondary | ICD-10-CM | POA: Diagnosis not present

## 2011-12-26 DIAGNOSIS — M6281 Muscle weakness (generalized): Secondary | ICD-10-CM | POA: Diagnosis not present

## 2011-12-26 DIAGNOSIS — R5381 Other malaise: Secondary | ICD-10-CM

## 2011-12-26 DIAGNOSIS — R188 Other ascites: Secondary | ICD-10-CM | POA: Diagnosis present

## 2011-12-26 DIAGNOSIS — R197 Diarrhea, unspecified: Secondary | ICD-10-CM

## 2011-12-26 DIAGNOSIS — Z6826 Body mass index (BMI) 26.0-26.9, adult: Secondary | ICD-10-CM

## 2011-12-26 DIAGNOSIS — R579 Shock, unspecified: Secondary | ICD-10-CM

## 2011-12-26 DIAGNOSIS — R109 Unspecified abdominal pain: Secondary | ICD-10-CM

## 2011-12-26 DIAGNOSIS — A0472 Enterocolitis due to Clostridium difficile, not specified as recurrent: Secondary | ICD-10-CM

## 2011-12-26 DIAGNOSIS — F329 Major depressive disorder, single episode, unspecified: Secondary | ICD-10-CM | POA: Diagnosis present

## 2011-12-26 DIAGNOSIS — F10231 Alcohol dependence with withdrawal delirium: Secondary | ICD-10-CM

## 2011-12-26 DIAGNOSIS — K921 Melena: Secondary | ICD-10-CM | POA: Diagnosis not present

## 2011-12-26 DIAGNOSIS — K224 Dyskinesia of esophagus: Secondary | ICD-10-CM | POA: Diagnosis present

## 2011-12-26 DIAGNOSIS — A419 Sepsis, unspecified organism: Secondary | ICD-10-CM | POA: Diagnosis not present

## 2011-12-26 DIAGNOSIS — R6521 Severe sepsis with septic shock: Secondary | ICD-10-CM | POA: Diagnosis not present

## 2011-12-26 DIAGNOSIS — R509 Fever, unspecified: Secondary | ICD-10-CM

## 2011-12-26 DIAGNOSIS — K560 Paralytic ileus: Secondary | ICD-10-CM | POA: Diagnosis not present

## 2011-12-26 DIAGNOSIS — D62 Acute posthemorrhagic anemia: Secondary | ICD-10-CM

## 2011-12-26 DIAGNOSIS — J80 Acute respiratory distress syndrome: Secondary | ICD-10-CM

## 2011-12-26 DIAGNOSIS — F1020 Alcohol dependence, uncomplicated: Secondary | ICD-10-CM | POA: Diagnosis present

## 2011-12-26 DIAGNOSIS — R7989 Other specified abnormal findings of blood chemistry: Secondary | ICD-10-CM

## 2011-12-26 DIAGNOSIS — F10931 Alcohol use, unspecified with withdrawal delirium: Secondary | ICD-10-CM

## 2011-12-26 DIAGNOSIS — K862 Cyst of pancreas: Secondary | ICD-10-CM | POA: Diagnosis present

## 2011-12-26 DIAGNOSIS — R0789 Other chest pain: Secondary | ICD-10-CM | POA: Diagnosis present

## 2011-12-26 DIAGNOSIS — Z87891 Personal history of nicotine dependence: Secondary | ICD-10-CM

## 2011-12-26 DIAGNOSIS — K259 Gastric ulcer, unspecified as acute or chronic, without hemorrhage or perforation: Secondary | ICD-10-CM

## 2011-12-26 DIAGNOSIS — E871 Hypo-osmolality and hyponatremia: Secondary | ICD-10-CM

## 2011-12-26 DIAGNOSIS — N179 Acute kidney failure, unspecified: Secondary | ICD-10-CM | POA: Diagnosis not present

## 2011-12-26 DIAGNOSIS — Z9889 Other specified postprocedural states: Secondary | ICD-10-CM

## 2011-12-26 DIAGNOSIS — K449 Diaphragmatic hernia without obstruction or gangrene: Secondary | ICD-10-CM | POA: Diagnosis present

## 2011-12-26 DIAGNOSIS — E872 Acidosis, unspecified: Secondary | ICD-10-CM | POA: Diagnosis not present

## 2011-12-26 DIAGNOSIS — E440 Moderate protein-calorie malnutrition: Secondary | ICD-10-CM | POA: Diagnosis not present

## 2011-12-26 DIAGNOSIS — K863 Pseudocyst of pancreas: Secondary | ICD-10-CM

## 2011-12-26 DIAGNOSIS — R1013 Epigastric pain: Secondary | ICD-10-CM | POA: Diagnosis present

## 2011-12-26 DIAGNOSIS — F1920 Other psychoactive substance dependence, uncomplicated: Secondary | ICD-10-CM | POA: Diagnosis present

## 2011-12-26 DIAGNOSIS — K219 Gastro-esophageal reflux disease without esophagitis: Secondary | ICD-10-CM

## 2011-12-26 DIAGNOSIS — I498 Other specified cardiac arrhythmias: Secondary | ICD-10-CM | POA: Diagnosis not present

## 2011-12-26 DIAGNOSIS — N390 Urinary tract infection, site not specified: Secondary | ICD-10-CM | POA: Diagnosis present

## 2011-12-26 LAB — COMPREHENSIVE METABOLIC PANEL
ALT: 26 U/L (ref 0–35)
AST: 29 U/L (ref 0–37)
Albumin: 3.3 g/dL — ABNORMAL LOW (ref 3.5–5.2)
Alkaline Phosphatase: 96 U/L (ref 39–117)
BUN: 11 mg/dL (ref 6–23)
CO2: 21 mEq/L (ref 19–32)
Calcium: 9.1 mg/dL (ref 8.4–10.5)
Chloride: 97 mEq/L (ref 96–112)
Creatinine, Ser: 0.67 mg/dL (ref 0.50–1.10)
GFR calc Af Amer: >90 mL/min (ref >90)
GFR calc non Af Amer: >90 mL/min (ref >90)
Glucose, Bld: 174 mg/dL — ABNORMAL HIGH (ref 70–99)
Potassium: 3.8 mEq/L (ref 3.5–5.1)
Sodium: 133 mEq/L — ABNORMAL LOW (ref 135–145)
Total Bilirubin: 0.7 mg/dL (ref 0.3–1.2)
Total Protein: 7.3 g/dL (ref 6.0–8.3)

## 2011-12-26 LAB — CBC
HCT: 41.2 % (ref 36.0–46.0)
Hemoglobin: 13.3 g/dL (ref 12.0–15.0)
MCH: 28.4 pg (ref 26.0–34.0)
MCHC: 32.3 g/dL (ref 30.0–36.0)
MCV: 88 fL (ref 78.0–100.0)
Platelets: 297 10*3/uL (ref 150–400)
RBC: 4.68 MIL/uL (ref 3.87–5.11)
RDW: 14.4 % (ref 11.5–15.5)
WBC: 15.8 10*3/uL — ABNORMAL HIGH (ref 4.0–10.5)

## 2011-12-26 LAB — LIPASE, BLOOD: Lipase: 177 U/L — ABNORMAL HIGH (ref 11–59)

## 2011-12-26 MED ORDER — IBUPROFEN 200 MG PO TABS
ORAL_TABLET | ORAL | Status: AC
  Administered 2011-12-26: 600 mg
  Filled 2011-12-26: qty 3

## 2011-12-26 MED ORDER — IBUPROFEN 800 MG PO TABS: 800.0000 mg | ORAL_TABLET | Freq: Once | ORAL | Status: DC

## 2011-12-26 MED ORDER — ONDANSETRON HCL 4 MG/2ML IJ SOLN
4.0000 mg | Freq: Once | INTRAMUSCULAR | Status: AC
  Administered 2011-12-26: 4 mg via INTRAVENOUS
  Filled 2011-12-26: qty 2

## 2011-12-26 MED ORDER — HYDROMORPHONE HCL PF 1 MG/ML IJ SOLN
1.0000 mg | Freq: Once | INTRAMUSCULAR | Status: AC
  Administered 2011-12-26: 1 mg via INTRAVENOUS
  Filled 2011-12-26: qty 1

## 2011-12-26 MED ORDER — SODIUM CHLORIDE 0.9 % IV BOLUS (SEPSIS)
1000.0000 mL | Freq: Once | INTRAVENOUS | Status: AC
  Administered 2011-12-26: 1000 mL via INTRAVENOUS

## 2011-12-26 NOTE — ED Notes (Signed)
BJY:[id number]<FA> Expected date:12/26/11<BR> Expected time: 9:28 PM<BR> Means of arrival:Ambulance<BR> Comments:<BR> RM 8: ABD pain, hx of pancreatitis

## 2011-12-26 NOTE — ED Notes (Signed)
Pt states "pancreatitis" is acting up,  Pt has been drinking alcohol this week,  Last time she was in hospital for this was June,  Pt also noted to have a fever and bodyaches,  Pt is in NAD

## 2011-12-26 NOTE — ED Provider Notes (Signed)
History    55yF with abdominal pain. Onset early this morning. Constant and progressive through out day. Pt with hx of pancreatitis and says feels like similar. Nausea and vomiting. Subjective fever. No diarrhea. No urinary complaints. No CP, coughing or SOB. Continues to drink. Recent admit for same. MRCP was done 10/06/2011 which showed dilated common bile duct with biliary sludge. She subsequently underwent an ERCP with sphincterotomy on 10/06/2011. There were clots and a small stone noted in her common bile duct, which were cleared during the procedure. The bleeding was felt to be due to a communication between the pseudocyst and the common bile duct.   CSN: [id number]  Arrival date & time 12/26/11  2140   First MD Initiated Contact with Patient 12/26/11 2144      Chief Complaint  Patient presents with  . Abdominal Pain    (Consider location/radiation/quality/duration/timing/severity/associated sxs/prior treatment) HPI  Past Medical History  Diagnosis Date  . GERD (gastroesophageal reflux disease)   . Rosacea   . Elevated liver function tests   . Wears glasses   . Chronic diarrhea   . Pancreatitis 01/2010    2011 admission was second admission, 3rd in 02/2011  . Alcohol abuse     H/o withdrawal   . PONV (postoperative nausea and vomiting)     Past Surgical History  Procedure Date  . Cholecystectomy   . Shoulder surgery [id number]  . Diagnostic mammogram 2008  . Tubal ligation   . Colonoscopy Never  . Esophagogastroduodenoscopy 03/21/2011    Procedure: ESOPHAGOGASTRODUODENOSCOPY (EGD);  Surgeon: Yancey Flemings, MD;  Location: [facility] ENDOSCOPY;  Service: Endoscopy;  Laterality: N/A;  Patient may need to be done at bedside tomorrow depending on status  . Ercp 10/06/2011    Procedure: ENDOSCOPIC RETROGRADE CHOLANGIOPANCREATOGRAPHY (ERCP);  Surgeon: Theda Belfast, MD;  Location: [facility] ENDOSCOPY;  Service: Endoscopy;  Laterality: N/A;    Family History  Problem Relation Age of Onset  .  Stroke Mother   . Heart disease Father   . Heart failure Father   . Heart disease Sister     History  Substance Use Topics  . Smoking status: Former Smoker -- 5 years    Quit date: 09/22/1978  . Smokeless tobacco: Never Used  . Alcohol Use: 1.5 - 2.0 oz/week    3-4 drink(s) per week     gets dt's - can make it up to 1 week at times    OB History    Grav Para Term Preterm Abortions TAB SAB Ect Mult Living                  Review of Systems   Review of symptoms negative unless otherwise noted in HPI.   Allergies  Ciprofloxacin  Home Medications   Current Outpatient Rx  Name Route Sig Dispense Refill  . LORAZEPAM 1 MG PO TABS Oral Take 1 mg by mouth 2 (two) times daily as needed. Anxiety    . METOCLOPRAMIDE HCL 10 MG PO TABS Oral Take 10 mg by mouth daily as needed. For nausea    . OXYCODONE HCL 5 MG PO TABS Oral Take 5 mg by mouth every 4 (four) hours as needed.      BP 121/71  Pulse 110  Temp 103 F (39.4 C) (Oral)  Resp 16  SpO2 93%  Physical Exam  Nursing note and vitals reviewed. Constitutional: She appears well-developed and well-nourished. No distress.  HENT:  Head: Normocephalic and atraumatic.  Eyes: Conjunctivae are normal. Right  eye exhibits no discharge. Left eye exhibits no discharge.  Neck: Neck supple.  Cardiovascular: Regular rhythm and normal heart sounds.  Exam reveals no gallop and no friction rub.   No murmur heard.      tachy  Pulmonary/Chest: Effort normal and breath sounds normal. No respiratory distress.  Abdominal: She exhibits no distension. There is tenderness. There is guarding.       Abdomen diffuse tenderness. Voluntary guarding.  Musculoskeletal: She exhibits no edema and no tenderness.  Neurological: She is alert.  Skin: Skin is warm and dry.  Psychiatric: She has a normal mood and affect. Her behavior is normal. Thought content normal.    ED Course  Procedures (including critical care time)  Labs Reviewed  LIPASE,  BLOOD - Abnormal; Notable for the following:    Lipase 177 (*)     All other components within normal limits  COMPREHENSIVE METABOLIC PANEL - Abnormal; Notable for the following:    Sodium 133 (*)     Glucose, Bld 174 (*)     Albumin 3.3 (*)     All other components within normal limits  CBC - Abnormal; Notable for the following:    WBC 15.8 (*)     All other components within normal limits  URINALYSIS, ROUTINE W REFLEX MICROSCOPIC   Dg Abd Acute W/chest  12/26/2011  *RADIOLOGY REPORT*  Clinical Data: Epigastric pain and nausea.  ACUTE ABDOMEN SERIES (ABDOMEN 2 VIEW & CHEST 1 VIEW)  Comparison: CT abdomen and pelvis 09/27/2011.  Findings: Shallow inspiration with atelectasis or infiltration in the lung bases.  Normal heart size and pulmonary vascularity.  No blunting of costophrenic angles.  No pneumothorax.  Mediastinal contours are intact.  Calcification of the aorta.  Esophageal hiatal hernia behind the heart.  Scattered gas and stool in the colon.  No small or large bowel distension.  No abnormal air fluid levels.  No free air in the abdomen.  Surgical clips in the right upper quadrant.  Degenerative changes in the spine and hips.  IMPRESSION: Shallow inspiration with infiltration or atelectasis in the lung bases.  Esophageal hiatal hernia.  Nonobstructing bowel gas pattern.   Original Report Authenticated By: Marlon Pel, M.D.      1. Abdominal pain   2. Pancreatitis   3. Fever       MDM  55yf with abdominal pain. Very tender on exam and pt actually pushing my hands out of the way when I attempt to examine. Will medicate to make more comfortable. XR to eval for free air. NPO, Labs, IVF. Pt with hx of pancreatitis and reports similarity to previous symptoms but given degree of tenderness and fever will CT.  11:36 PM Acute abdomen series with no acute findings. Pt rexamined. Mild improvement of symptoms. Tenderness in epigastrium w/o peritoneal signs. HR now around 100. Source  of fever remains unclear at this time. UA pending. Could possible be suppurative pseudocyst. CT still pending. Pt with no respiratory complaints and clear on exam. Is alcoholic though and vomiting which puts at risk for aspiration but chest on obstruction series looks fine.  BP is good. Will defer from empiric abx at this time. Pt re medicated with pain meds/antiemetics. Suspect will require admit.             Raeford Razor, MD 12/31/11 0500

## 2011-12-27 ENCOUNTER — Encounter (HOSPITAL_COMMUNITY): Payer: Self-pay | Admitting: Internal Medicine

## 2011-12-27 DIAGNOSIS — R1013 Epigastric pain: Secondary | ICD-10-CM | POA: Diagnosis present

## 2011-12-27 DIAGNOSIS — R109 Unspecified abdominal pain: Secondary | ICD-10-CM

## 2011-12-27 DIAGNOSIS — E871 Hypo-osmolality and hyponatremia: Secondary | ICD-10-CM | POA: Diagnosis present

## 2011-12-27 DIAGNOSIS — D72829 Elevated white blood cell count, unspecified: Secondary | ICD-10-CM | POA: Diagnosis present

## 2011-12-27 DIAGNOSIS — K859 Acute pancreatitis without necrosis or infection, unspecified: Secondary | ICD-10-CM | POA: Diagnosis present

## 2011-12-27 LAB — CBC
HCT: 37.7 % (ref 36.0–46.0)
Hemoglobin: 11.8 g/dL — ABNORMAL LOW (ref 12.0–15.0)
MCH: 28.2 pg (ref 26.0–34.0)
MCHC: 31.3 g/dL (ref 30.0–36.0)
MCV: 90 fL (ref 78.0–100.0)
RBC: 4.19 MIL/uL (ref 3.87–5.11)

## 2011-12-27 LAB — URINE MICROSCOPIC-ADD ON

## 2011-12-27 LAB — BASIC METABOLIC PANEL
BUN: 13 mg/dL (ref 6–23)
CO2: 25 mEq/L (ref 19–32)
GFR calc non Af Amer: 77 mL/min — ABNORMAL LOW (ref >90)
Glucose, Bld: 114 mg/dL — ABNORMAL HIGH (ref 70–99)
Potassium: 4.2 mEq/L (ref 3.5–5.1)

## 2011-12-27 LAB — URINALYSIS, ROUTINE W REFLEX MICROSCOPIC
Glucose, UA: NEGATIVE mg/dL
Hgb urine dipstick: NEGATIVE
Ketones, ur: 15 mg/dL — AB
Nitrite: NEGATIVE
Protein, ur: 30 mg/dL — AB
Specific Gravity, Urine: 1.027 (ref 1.005–1.030)
Urobilinogen, UA: 1 mg/dL (ref 0.0–1.0)
pH: 6 (ref 5.0–8.0)

## 2011-12-27 LAB — MAGNESIUM: Magnesium: 1.4 mg/dL — ABNORMAL LOW (ref 1.5–2.5)

## 2011-12-27 MED ORDER — ALUM & MAG HYDROXIDE-SIMETH 200-200-20 MG/5ML PO SUSP: 30.0000 mL | Freq: Four times a day (QID) | ORAL | Status: DC | PRN

## 2011-12-27 MED ORDER — ONDANSETRON HCL 4 MG/2ML IJ SOLN
4.0000 mg | Freq: Four times a day (QID) | INTRAMUSCULAR | Status: DC | PRN
  Administered 2011-12-27 – 2012-02-05 (×19): 4 mg via INTRAVENOUS
  Filled 2011-12-27 (×20): qty 2

## 2011-12-27 MED ORDER — ONDANSETRON HCL 4 MG PO TABS: 4.0000 mg | ORAL_TABLET | Freq: Four times a day (QID) | ORAL | Status: DC | PRN

## 2011-12-27 MED ORDER — SODIUM CHLORIDE 0.9 % IV BOLUS (SEPSIS)
500.0000 mL | Freq: Once | INTRAVENOUS | Status: AC
  Administered 2011-12-27: 500 mL via INTRAVENOUS

## 2011-12-27 MED ORDER — HYDROMORPHONE HCL PF 1 MG/ML IJ SOLN
1.0000 mg | INTRAMUSCULAR | Status: DC | PRN
  Administered 2011-12-27: 1 mg via INTRAVENOUS

## 2011-12-27 MED ORDER — OXYCODONE HCL 5 MG PO TABS
5.0000 mg | ORAL_TABLET | ORAL | Status: DC | PRN
  Administered 2011-12-27 (×2): 5 mg via ORAL
  Filled 2011-12-27 (×2): qty 1

## 2011-12-27 MED ORDER — ACETAMINOPHEN 325 MG PO TABS
650.0000 mg | ORAL_TABLET | Freq: Four times a day (QID) | ORAL | Status: DC | PRN
  Administered 2011-12-30: 650 mg via ORAL
  Filled 2011-12-27 (×2): qty 2

## 2011-12-27 MED ORDER — MAGNESIUM OXIDE 400 (241.3 MG) MG PO TABS
400.0000 mg | ORAL_TABLET | ORAL | Status: AC
  Administered 2011-12-27 – 2011-12-28 (×2): 400 mg via ORAL
  Filled 2011-12-27 (×2): qty 1

## 2011-12-27 MED ORDER — TRAMADOL HCL 50 MG PO TABS
100.0000 mg | ORAL_TABLET | Freq: Three times a day (TID) | ORAL | Status: DC | PRN
  Administered 2011-12-27: 100 mg via ORAL
  Filled 2011-12-27: qty 2

## 2011-12-27 MED ORDER — SODIUM CHLORIDE 0.9 % IV SOLN: INTRAVENOUS | Status: DC

## 2011-12-27 MED ORDER — OXYCODONE HCL 15 MG PO TB12
15.0000 mg | ORAL_TABLET | Freq: Two times a day (BID) | ORAL | Status: DC
  Administered 2011-12-27 – 2011-12-29 (×6): 15 mg via ORAL
  Filled 2011-12-27 (×6): qty 1

## 2011-12-27 MED ORDER — HYDROMORPHONE HCL PF 1 MG/ML IJ SOLN
0.5000 mg | INTRAMUSCULAR | Status: DC | PRN
  Filled 2011-12-27: qty 1

## 2011-12-27 MED ORDER — THIAMINE HCL 100 MG/ML IJ SOLN
100.0000 mg | Freq: Every day | INTRAMUSCULAR | Status: DC
  Administered 2011-12-31 – 2012-01-05 (×6): 100 mg via INTRAVENOUS
  Filled 2011-12-27 (×11): qty 1

## 2011-12-27 MED ORDER — ONDANSETRON HCL 4 MG/2ML IJ SOLN: 4.0000 mg | Freq: Three times a day (TID) | INTRAMUSCULAR | Status: DC | PRN

## 2011-12-27 MED ORDER — ZOLPIDEM TARTRATE 5 MG PO TABS: 5.0000 mg | ORAL_TABLET | Freq: Every evening | ORAL | Status: DC | PRN

## 2011-12-27 MED ORDER — LORAZEPAM 1 MG PO TABS: 1.0000 mg | ORAL_TABLET | Freq: Four times a day (QID) | ORAL | Status: DC | PRN

## 2011-12-27 MED ORDER — SODIUM CHLORIDE 0.9 % IV SOLN
INTRAVENOUS | Status: DC
  Administered 2011-12-27: via INTRAVENOUS

## 2011-12-27 MED ORDER — VITAMIN B-1 100 MG PO TABS
100.0000 mg | ORAL_TABLET | Freq: Every day | ORAL | Status: DC
  Administered 2011-12-27 – 2011-12-30 (×4): 100 mg via ORAL
  Filled 2011-12-27 (×5): qty 1

## 2011-12-27 MED ORDER — HYDROMORPHONE HCL PF 1 MG/ML IJ SOLN: 1.0000 mg | INTRAMUSCULAR | Status: DC | PRN

## 2011-12-27 MED ORDER — HYDROMORPHONE HCL PF 1 MG/ML IJ SOLN
1.0000 mg | Freq: Once | INTRAMUSCULAR | Status: AC
  Administered 2011-12-27: 1 mg via INTRAVENOUS
  Filled 2011-12-27: qty 1

## 2011-12-27 MED ORDER — ENOXAPARIN SODIUM 40 MG/0.4ML ~~LOC~~ SOLN
40.0000 mg | SUBCUTANEOUS | Status: DC
  Administered 2011-12-27 – 2012-01-02 (×7): 40 mg via SUBCUTANEOUS
  Filled 2011-12-27 (×7): qty 0.4

## 2011-12-27 MED ORDER — LORAZEPAM 2 MG/ML IJ SOLN
1.0000 mg | Freq: Four times a day (QID) | INTRAMUSCULAR | Status: DC | PRN
  Administered 2011-12-27 – 2011-12-28 (×4): 1 mg via INTRAVENOUS
  Administered 2011-12-28: 0.5 mg via INTRAVENOUS
  Administered 2011-12-29: 1 mg via INTRAVENOUS
  Filled 2011-12-27 (×6): qty 1

## 2011-12-27 MED ORDER — HYDROMORPHONE HCL PF 2 MG/ML IJ SOLN: 2.0000 mg | INTRAMUSCULAR | Status: DC | PRN

## 2011-12-27 MED ORDER — HYDROMORPHONE HCL PF 1 MG/ML IJ SOLN
0.5000 mg | INTRAMUSCULAR | Status: DC | PRN
  Administered 2011-12-27 (×2): 1 mg via INTRAVENOUS
  Filled 2011-12-27 (×2): qty 1

## 2011-12-27 MED ORDER — KETOROLAC TROMETHAMINE 30 MG/ML IJ SOLN
30.0000 mg | Freq: Four times a day (QID) | INTRAMUSCULAR | Status: DC
  Administered 2011-12-27 – 2011-12-28 (×5): 30 mg via INTRAVENOUS
  Filled 2011-12-27 (×9): qty 1

## 2011-12-27 MED ORDER — HYDROMORPHONE HCL PF 2 MG/ML IJ SOLN
2.0000 mg | INTRAMUSCULAR | Status: DC | PRN
  Administered 2011-12-27: 2 mg via INTRAVENOUS
  Administered 2011-12-27: 1 mg via INTRAVENOUS
  Administered 2011-12-27 – 2011-12-28 (×5): 2 mg via INTRAVENOUS
  Administered 2011-12-28: 1 mg via INTRAVENOUS
  Administered 2011-12-28 – 2011-12-29 (×2): 2 mg via INTRAVENOUS
  Filled 2011-12-27 (×10): qty 1

## 2011-12-27 MED ORDER — SODIUM CHLORIDE 0.9 % IV SOLN
INTRAVENOUS | Status: DC
  Administered 2011-12-27: 1000 mL via INTRAVENOUS
  Administered 2011-12-27: 16:00:00 via INTRAVENOUS
  Administered 2011-12-27: 1000 mL via INTRAVENOUS
  Administered 2011-12-28: 15:00:00 via INTRAVENOUS
  Administered 2011-12-28 (×2): 1000 mL via INTRAVENOUS
  Administered 2011-12-28 – 2011-12-29 (×3): via INTRAVENOUS
  Administered 2011-12-30: 150 mL/h via INTRAVENOUS
  Administered 2011-12-31: 05:00:00 via INTRAVENOUS
  Administered 2012-01-08: 10 mL/h via INTRAVENOUS
  Administered 2012-01-13: 20 mL/h via INTRAVENOUS
  Administered 2012-01-25: 17:00:00 via INTRAVENOUS
  Administered 2012-01-29: 20 mL/h via INTRAVENOUS
  Administered 2012-02-01 – 2012-02-07 (×2): 20 mL via INTRAVENOUS

## 2011-12-27 MED ORDER — IOHEXOL 300 MG/ML  SOLN
100.0000 mL | Freq: Once | INTRAMUSCULAR | Status: AC | PRN
  Administered 2011-12-27: 100 mL via INTRAVENOUS

## 2011-12-27 MED ORDER — FOLIC ACID 1 MG PO TABS
1.0000 mg | ORAL_TABLET | Freq: Every day | ORAL | Status: DC
  Administered 2011-12-27 – 2011-12-30 (×4): 1 mg via ORAL
  Filled 2011-12-27 (×5): qty 1

## 2011-12-27 MED ORDER — ACETAMINOPHEN 650 MG RE SUPP
650.0000 mg | Freq: Four times a day (QID) | RECTAL | Status: DC | PRN
  Administered 2012-01-01 – 2012-01-15 (×8): 650 mg via RECTAL
  Filled 2011-12-27 (×10): qty 1

## 2011-12-27 MED ORDER — ADULT MULTIVITAMIN W/MINERALS CH
1.0000 | ORAL_TABLET | Freq: Every day | ORAL | Status: DC
  Administered 2011-12-27 – 2011-12-30 (×4): 1 via ORAL
  Filled 2011-12-27 (×5): qty 1

## 2011-12-27 MED ORDER — BIOTENE DRY MOUTH MT LIQD
15.0000 mL | Freq: Two times a day (BID) | OROMUCOSAL | Status: DC
  Administered 2011-12-27 – 2011-12-31 (×9): 15 mL via OROMUCOSAL

## 2011-12-27 NOTE — H&P (Signed)
[facility] History and Physical  [name] KGM:[id number] DOB: [birth year] DOA: 12/26/2011  Referring physician: EDP PCP: Thayer Headings, MD   Chief Complaint: ABD Pain  HPI: [name] is a 56 y.o. female who presents with complaints of epigastric ABD Pain which started in the AM along with nausea and vomiting.  She reports not being able to eat or drink due to her pain and nausea. She rated her pain as a 10/10.  She developed fevers and chills in the afternoon.  She has a history of Alcohol abuse but reports that she has not been able to drink more than a few ounces of alcohol due to her pancreatitis.  Her last drink was 2 days ago.    Review of Systems: The patient denies anorexia, weight loss, vision loss, decreased hearing, hoarseness, chest pain, syncope, dyspnea on exertion, peripheral edema, balance deficits, hemoptysis, melena, hematochezia, severe indigestion/heartburn, hematuria, incontinence, genital sores, muscle weakness, suspicious skin lesions, transient blindness, difficulty walking, depression, unusual weight change, abnormal bleeding, enlarged lymph nodes, angioedema, and breast masses.    Past Medical History  Diagnosis Date  . GERD (gastroesophageal reflux disease)   . Rosacea   . Elevated liver function tests   . Wears glasses   . Chronic diarrhea   . Pancreatitis 01/2010    2011 admission was second admission, 3rd in 02/2011  . Alcohol abuse     H/o withdrawal   . PONV (postoperative nausea and vomiting)    Past Surgical History  Procedure Date  . Cholecystectomy   . Shoulder surgery [id number]  . Diagnostic mammogram 2008  . Tubal ligation   . Colonoscopy Never  . Esophagogastroduodenoscopy 03/21/2011    Procedure: ESOPHAGOGASTRODUODENOSCOPY (EGD);  Surgeon: Yancey Flemings, MD;  Location: [name] ENDOSCOPY;  Service: Endoscopy;  Laterality: N/A;  Patient may need to be done at bedside tomorrow depending on status  . Ercp 10/06/2011    Procedure:  ENDOSCOPIC RETROGRADE CHOLANGIOPANCREATOGRAPHY (ERCP);  Surgeon: Theda Belfast, MD;  Location: [facility] ENDOSCOPY;  Service: Endoscopy;  Laterality: N/A;   Allergies  Allergen Reactions  . Ciprofloxacin     REACTION: nausea    Social History:  reports that she quit smoking about 33 years ago. She has never used smokeless tobacco. She reports that she drinks about 1.5 - 2 ounces of alcohol per week. She reports that she does not use illicit drugs.    Family History  Problem Relation Age of Onset  . Stroke Mother   . Heart disease Father   . Heart failure Father   . Heart disease Sister       Prior to Admission medications   Medication Sig Start Date End Date Taking? Authorizing Provider  LORazepam (ATIVAN) 1 MG tablet Take 1 mg by mouth 2 (two) times daily as needed. Anxiety   Yes Historical Provider, MD  metoCLOPramide (REGLAN) 10 MG tablet Take 10 mg by mouth daily as needed. For nausea   Yes Historical Provider, MD  oxyCODONE (OXY IR/ROXICODONE) 5 MG immediate release tablet Take 5 mg by mouth every 4 (four) hours as needed.   Yes Historical Provider, MD   Physical Exam: Filed Vitals:   12/26/11 2142  BP: 121/71  Pulse: 110  Temp: 103 F (39.4 C)  TempSrc: Oral  Resp: 16  SpO2: 93%    Physical Exam:  GEN: Pleasant well nourished and well developed 57 year old Caucasian female examined  and in no acute distress; cooperative with exam Filed Vitals:   12/26/11  2142  BP: 121/71  Pulse: 110  Temp: 103 F (39.4 C)  TempSrc: Oral  Resp: 16  SpO2: 93%   Blood pressure 121/71, pulse 110, temperature 103 F (39.4 C), temperature source Oral, resp. rate 16, last menstrual period 12/08/2006, SpO2 93.00%. PSYCH: She is alert and oriented x4; does not appear anxious does not appear depressed; affect is normal HEENT: Normocephalic and Atraumatic, Mucous membranes pink; PERRLA; EOM intact; Fundi:  Benign;  No scleral icterus, Nares: Patent, Oropharynx: Clear, Fair Dentition, Neck:   FROM, no cervical lymphadenopathy nor thyromegaly or carotid bruit; no JVD; Breasts:: Not examined CHEST WALL: No tenderness CHEST: Normal respiration, clear to auscultation bilaterally HEART: Regular rate and rhythm; no murmurs rubs or gallops BACK: No kyphosis or scoliosis; no CVA tenderness ABDOMEN: Positive Bowel Sounds, Obese, soft non-tender; no masses, no organomegaly.   Rectal Exam: Not done EXTREMITIES: No bone or joint deformity; age-appropriate arthropathy of the hands and knees; no cyanosis, clubbing or edema; no ulcerations. Genitalia: not examined PULSES: 2+ and symmetric SKIN: Normal hydration no rash or ulceration CNS: Cranial nerves 2-12 grossly intact no focal neurologic deficit    Labs on Admission:  Basic Metabolic Panel:  Lab 12/26/11 1610  NA 133*  K 3.8  CL 97  CO2 21  GLUCOSE 174*  BUN 11  CREATININE 0.67  CALCIUM 9.1  MG --  PHOS --   Liver Function Tests:  Lab 12/26/11 2220  AST 29  ALT 26  ALKPHOS 96  BILITOT 0.7  PROT 7.3  ALBUMIN 3.3*    Lab 12/26/11 2220  LIPASE 177*  AMYLASE --   No results found for this basename: AMMONIA:5 in the last 168 hours CBC:  Lab 12/26/11 2220  WBC 15.8*  NEUTROABS --  HGB 13.3  HCT 41.2  MCV 88.0  PLT 297    Radiological Exams on Admission:     Dg Abd Acute W/chest  12/26/2011  *RADIOLOGY REPORT*  Clinical Data: Epigastric pain and nausea.  ACUTE ABDOMEN SERIES (ABDOMEN 2 VIEW & CHEST 1 VIEW)  Comparison: CT abdomen and pelvis 09/27/2011.  Findings: Shallow inspiration with atelectasis or infiltration in the lung bases.  Normal heart size and pulmonary vascularity.  No blunting of costophrenic angles.  No pneumothorax.  Mediastinal contours are intact.  Calcification of the aorta.  Esophageal hiatal hernia behind the heart.  Scattered gas and stool in the colon.  No small or large bowel distension.  No abnormal air fluid levels.  No free air in the abdomen.  Surgical clips in the right upper  quadrant.  Degenerative changes in the spine and hips.  IMPRESSION: Shallow inspiration with infiltration or atelectasis in the lung bases.  Esophageal hiatal hernia.  Nonobstructing bowel gas pattern.   Original Report Authenticated By: Marlon Pel, M.D.      Assessment: Principal Problem:  *Acute pancreatitis Active Problems:  Alcohol abuse  Epigastric abdominal pain  Leukocytosis  Hyponatremia   Plan:    1. Admit to Med/Surg Bed 2. Clear Liquids, Anti-Emetics, Pain Control 3. IVFs 4. Monitor Lipase levels and Electrolytes 5. CIWA Protocol 6.  DVT Prophylaxis   Code Status:  FULL CODE Family Communication: N/A Disposition Plan: Return to Home  Time spent: 60 minutes  Ron Parker [facility] Pager [phone]  If 7PM-7AM, please contact night-coverage [email or web address] Password [email or web address] 12/27/2011, 1:00 AM

## 2011-12-27 NOTE — Progress Notes (Signed)
Patient with continual c/o pain > 8 even after dilaudid 1 mg at 0500 Paged NP about increasing frequency of pain medication. Orders received.

## 2011-12-27 NOTE — Progress Notes (Signed)
Called to inform Dr [name] regarding patient's low BP. She was comfortable leaving IVF going at 193ml/hr and informed me to monitor patient's need for pain medication. Will continue to monitor.

## 2011-12-27 NOTE — ED Notes (Signed)
Victorino Dike RN to return call for report

## 2011-12-27 NOTE — Progress Notes (Signed)
Upon reviewing records, She was admitted on 10/05/2011 with recurrent pancreatitis. Because of elevated liver function tests, a GI consultation was requested and an MRCP was done 10/06/2011 which showed dilated common bile duct with biliary sludge. She subsequently underwent an ERCP with sphincterotomy on 10/06/2011. There were clots and a small stone noted in her common bile duct, which were cleared during the procedure. The bleeding was felt to be due to a communication between the pseudocyst and the common bile duct. The patient was treated TNA and PCA dilaudid for pain control, which was been gradually.   No evidence of pancreatic necrosis or focal abscess therefore hold off on antibiotics for now Monitor liver function tests as well as lipase  Increase Dilaudid for pain control

## 2011-12-28 LAB — CBC
HCT: 38.5 % (ref 36.0–46.0)
Hemoglobin: 12 g/dL (ref 12.0–15.0)
MCH: 28.2 pg (ref 26.0–34.0)
MCHC: 31.2 g/dL (ref 30.0–36.0)
MCV: 90.4 fL (ref 78.0–100.0)
RDW: 14.7 % (ref 11.5–15.5)

## 2011-12-28 LAB — COMPREHENSIVE METABOLIC PANEL
ALT: 18 U/L (ref 0–35)
AST: 24 U/L (ref 0–37)
Albumin: 2.3 g/dL — ABNORMAL LOW (ref 3.5–5.2)
Alkaline Phosphatase: 76 U/L (ref 39–117)
Calcium: 8 mg/dL — ABNORMAL LOW (ref 8.4–10.5)
Glucose, Bld: 111 mg/dL — ABNORMAL HIGH (ref 70–99)
Potassium: 5 mEq/L (ref 3.5–5.1)
Sodium: 134 mEq/L — ABNORMAL LOW (ref 135–145)
Total Protein: 6 g/dL (ref 6.0–8.3)

## 2011-12-28 MED ORDER — HYDROCODONE-ACETAMINOPHEN 10-325 MG PO TABS
1.0000 | ORAL_TABLET | Freq: Four times a day (QID) | ORAL | Status: DC | PRN
  Administered 2011-12-28 (×2): 1 via ORAL
  Filled 2011-12-28 (×3): qty 1

## 2011-12-28 MED ORDER — SODIUM CHLORIDE 0.9 % IV BOLUS (SEPSIS)
500.0000 mL | Freq: Once | INTRAVENOUS | Status: AC
  Administered 2011-12-28: 500 mL via INTRAVENOUS

## 2011-12-28 MED ORDER — MAGNESIUM SULFATE 40 MG/ML IJ SOLN
4.0000 g | Freq: Once | INTRAMUSCULAR | Status: AC
  Administered 2011-12-28: 4 g via INTRAVENOUS
  Filled 2011-12-28: qty 100

## 2011-12-28 NOTE — Progress Notes (Signed)
UR done. 

## 2011-12-28 NOTE — Progress Notes (Signed)
BP 86/56 hr 102--still with no urge to urinated. Paged NP. Order for another 500 cc bolus of NS.

## 2011-12-28 NOTE — Progress Notes (Addendum)
Patient has not voided since she was in the Emergency Room. Does not feel urge to urinated. Checked bladder scan with 240 cc showing with scanner. Paged NP with orders for 500 cc bolus. BP also low.

## 2011-12-28 NOTE — Progress Notes (Signed)
Patient able to void 350 cc of concentrated amber urine but had difficulty starting stream and c/o pain post void. C/o tenderness in lower mid abdomen. Hx of UTI's. Also MG+ 1.4 with labs today--not addressed. Paged NP. Orders received to culture urine and for mag oxide x 2 doses.

## 2011-12-28 NOTE — Progress Notes (Signed)
Patient still with no urge to urinated. Assisted patient to [facility]. Patient unable to void. Bladder scan shows only 178 cc. Labs show increase in creatinine/ BUN and decrease in GFR. BP remains with SBP in 80-90's. Remains tachycardic. Paged NP with orders for 500 cc bolus obtained.

## 2011-12-28 NOTE — Progress Notes (Signed)
[facility] PROGRESS NOTE  [name] RUE:[id number] DOB: 09-15-1955 DOA: 12/26/2011 PCP: Thayer Headings, MD  Assessment/Plan: Principal Problem:  *Acute pancreatitis Active Problems:  Alcohol abuse  Epigastric abdominal pain  Leukocytosis  Hyponatremia  1. Acute. Pancreatitis lipase are still elevated,Liver function otherwise normal. Descalate pain medications because of low blood pressure 2. Acute renal insufficiency continue generous IV hydration, likely in the setting of hypotension . We'll discontinue Toradol 3. Leukocytosis now resolved likely stress response no evidence of abscess 4. Hypomagnesemia replete 5. Alcohol dependence continue CIWA protocol    Code chief insufficiency Status: full Family Communication: family updated about patient's clinical progress Disposition Plan:  As above    Brief narrative: 56 y.o. female who presents with complaints of epigastric ABD Pain which started in the AM along with nausea and vomiting. She reports not being able to eat or drink due to her pain and nausea. She rated her pain as a 10/10. She developed fevers and chills in the afternoon. She has a history of Alcohol abuse but reports that she has not been able to drink more than a few ounces of alcohol due to her pancreatitis. Her last drink was 2 days ago   Consultants:   none   Procedures:   none   Antibiotics:   none   HPI/Subjective:  still complaining of pain   Objective: Filed Vitals:   12/27/11 2100 12/27/11 2310 12/28/11 0207 12/28/11 0847  BP: 104/59 96/55 94/60  98/60  Pulse: 101 101 108 103  Temp:   97.9 F (36.6 C)   TempSrc:   Oral   Resp:   18   Height:      Weight:      SpO2:  93% 94% 90%    Intake/Output Summary (Last 24 hours) at 12/28/11 0856 Last data filed at 12/28/11 0650  Gross per 24 hour  Intake 4693.76 ml  Output    428 ml  Net 4265.76 ml    Exam:  HENT:  Head: Atraumatic.  Nose: Nose normal.  Mouth/Throat:  Oropharynx is clear and moist.  Eyes: Conjunctivae are normal. Pupils are equal, round, and reactive to light. No scleral icterus.  Neck: Neck supple. No tracheal deviation present.  Cardiovascular: Normal rate, regular rhythm, normal heart sounds and intact distal pulses.  Pulmonary/Chest: Effort normal and breath sounds normal. No respiratory distress.  Abdominal: Soft. Normal appearance and bowel sounds are normal. She exhibits no distension. There is no tenderness.  Musculoskeletal: She exhibits no edema and no tenderness.  Neurological: She is alert. No cranial nerve deficit.    Data Reviewed: Basic Metabolic Panel:  Lab 12/28/11 1478 12/27/11 0419 12/26/11 2220  NA 134* 135 133*  K 5.0 4.2 3.8  CL 103 101 97  CO2 20 25 21   GLUCOSE 111* 114* 174*  BUN 25* 13 11  CREATININE 1.34* 0.83 0.67  CALCIUM 8.0* 8.2* 9.1  MG -- 1.4* --  PHOS -- -- --    Liver Function Tests:  Lab 12/28/11 0342 12/26/11 2220  AST 24 29  ALT 18 26  ALKPHOS 76 96  BILITOT 0.4 0.7  PROT 6.0 7.3  ALBUMIN 2.3* 3.3*    Lab 12/28/11 0342 12/26/11 2220  LIPASE 179* 177*  AMYLASE -- --   No results found for this basename: AMMONIA:5 in the last 168 hours  CBC:  Lab 12/28/11 0342 12/27/11 0419 12/26/11 2220  WBC 6.8 11.0* 15.8*  NEUTROABS -- -- --  HGB 12.0 11.8* 13.3  HCT 38.5 37.7  41.2  MCV 90.4 90.0 88.0  PLT 289 255 297    Cardiac Enzymes: No results found for this basename: CKTOTAL:5,CKMB:5,CKMBINDEX:5,TROPONINI:5 in the last 168 hours BNP (last 3 results) No results found for this basename: PROBNP:3 in the last 8760 hours   CBG: No results found for this basename: GLUCAP:5 in the last 168 hours  No results found for this or any previous visit (from the past 240 hour(s)).   Studies: Ct Abdomen Pelvis W Contrast  12/27/2011  *RADIOLOGY REPORT*  Clinical Data: Pain.  Pancreatitis.  White cell count 15.8.  Lipase 177.  Field  CT ABDOMEN AND PELVIS WITH CONTRAST  Technique:   Multidetector CT imaging of the abdomen and pelvis was performed following the standard protocol during bolus administration of intravenous contrast.  Contrast: OMNIPAQUE IOHEXOL 300 MG/ML  SOLN  Comparison: 09/27/2011.  Findings: Atelectasis or infiltration in both lung bases.  Large esophageal hiatal hernia.  There is extensive inflammatory infiltration and edema in the upper abdominal fat surrounding the pancreas and extending into the mesenteric and omental regions.  Changes are consistent with progressing acute pancreatitis.  Peripancreatic fluid collections are again demonstrated consistent with pseudocyst, similar to previous study.  Mild pancreatic ductal dilatation.  There appears to be homogeneous enhancement of the pancreatic parenchyma without evidence of pancreatic necrosis.  No focal abscess.  Mild intra and extrahepatic bile duct dilatation. Portal and mesenteric vessels appear patent.  Surgical absence of the gallbladder.  The liver, spleen, adrenal glands, kidneys, and retroperitoneal lymph nodes are unremarkable.  Calcification of the abdominal aorta without aneurysm.  The stomach, small bowel, and colon are not distended.  No free air in the abdomen.  Pelvis:  Small amount of free fluid in the pelvis, likely related to inflammatory process.  No loculated fluid collections.  Bladder is decompressed.  The uterus and adnexal structures are not enlarged.  The appendix is normal.  No diverticulitis.  No significant lymphadenopathy in the pelvis.  IMPRESSION: Significant increase in upper abdominal inflammatory changes and edema consistent with progression of acute pancreatitis.  Fluid collections consistent with pseudocyst appears stable.   Original Report Authenticated By: Marlon Pel, M.D.    Dg Abd Acute W/chest  12/26/2011  *RADIOLOGY REPORT*  Clinical Data: Epigastric pain and nausea.  ACUTE ABDOMEN SERIES (ABDOMEN 2 VIEW & CHEST 1 VIEW)  Comparison: CT abdomen and pelvis  09/27/2011.  Findings: Shallow inspiration with atelectasis or infiltration in the lung bases.  Normal heart size and pulmonary vascularity.  No blunting of costophrenic angles.  No pneumothorax.  Mediastinal contours are intact.  Calcification of the aorta.  Esophageal hiatal hernia behind the heart.  Scattered gas and stool in the colon.  No small or large bowel distension.  No abnormal air fluid levels.  No free air in the abdomen.  Surgical clips in the right upper quadrant.  Degenerative changes in the spine and hips.  IMPRESSION: Shallow inspiration with infiltration or atelectasis in the lung bases.  Esophageal hiatal hernia.  Nonobstructing bowel gas pattern.   Original Report Authenticated By: Marlon Pel, M.D.     Scheduled Meds:   . antiseptic oral rinse  15 mL Mouth Rinse BID  . enoxaparin (LOVENOX) injection  40 mg Subcutaneous Q24H  . folic acid  1 mg Oral Daily  . ketorolac  30 mg Intravenous Q6H  . magnesium oxide  400 mg Oral Q4H  . multivitamin with minerals  1 tablet Oral Daily  . oxyCODONE  15  mg Oral Q12H  . sodium chloride  500 mL Intravenous Once  . sodium chloride  500 mL Intravenous Once  . sodium chloride  500 mL Intravenous Once  . thiamine  100 mg Oral Daily   Or  . thiamine  100 mg Intravenous Daily  . DISCONTD: sodium chloride   Intravenous STAT   Continuous Infusions:   . sodium chloride 150 mL/hr at 12/28/11 9147    Principal Problem:  *Acute pancreatitis Active Problems:  Alcohol abuse  Epigastric abdominal pain  Leukocytosis  Hyponatremia    Time spent: 40 minutes   [facility]  [facility] Pager [phone]. If 8PM-8AM, please contact night-coverage at [email or web address], password [facility] 12/28/2011, 8:56 AM  LOS: 2 days

## 2011-12-29 LAB — CBC
HCT: 37.1 % (ref 36.0–46.0)
Hemoglobin: 11.4 g/dL — ABNORMAL LOW (ref 12.0–15.0)
MCHC: 30.7 g/dL (ref 30.0–36.0)
MCV: 90.9 fL (ref 78.0–100.0)
WBC: 10.5 10*3/uL (ref 4.0–10.5)

## 2011-12-29 LAB — COMPREHENSIVE METABOLIC PANEL
AST: 26 U/L (ref 0–37)
Albumin: 2.1 g/dL — ABNORMAL LOW (ref 3.5–5.2)
Alkaline Phosphatase: 80 U/L (ref 39–117)
BUN: 34 mg/dL — ABNORMAL HIGH (ref 6–23)
Chloride: 102 mEq/L (ref 96–112)
Potassium: 4.2 mEq/L (ref 3.5–5.1)
Total Protein: 5.6 g/dL — ABNORMAL LOW (ref 6.0–8.3)

## 2011-12-29 LAB — URINE CULTURE: Colony Count: 45000

## 2011-12-29 LAB — LIPASE, BLOOD: Lipase: 126 U/L — ABNORMAL HIGH (ref 11–59)

## 2011-12-29 MED ORDER — LORAZEPAM 2 MG/ML IJ SOLN
1.0000 mg | Freq: Four times a day (QID) | INTRAMUSCULAR | Status: DC | PRN
  Administered 2011-12-30 – 2011-12-31 (×2): 1 mg via INTRAVENOUS
  Filled 2011-12-29 (×3): qty 1

## 2011-12-29 MED ORDER — LEVALBUTEROL HCL 1.25 MG/0.5ML IN NEBU
1.2500 mg | INHALATION_SOLUTION | Freq: Once | RESPIRATORY_TRACT | Status: AC
  Administered 2011-12-30: 1.25 mg via RESPIRATORY_TRACT
  Filled 2011-12-29: qty 0.5

## 2011-12-29 MED ORDER — LORAZEPAM 2 MG/ML IJ SOLN
0.0000 mg | Freq: Four times a day (QID) | INTRAMUSCULAR | Status: AC
  Administered 2011-12-29 – 2011-12-30 (×7): 2 mg via INTRAVENOUS
  Filled 2011-12-29 (×7): qty 1

## 2011-12-29 MED ORDER — LORAZEPAM 1 MG PO TABS: 1.0000 mg | ORAL_TABLET | Freq: Four times a day (QID) | ORAL | Status: DC | PRN

## 2011-12-29 MED ORDER — LORAZEPAM 2 MG/ML IJ SOLN
0.0000 mg | Freq: Two times a day (BID) | INTRAMUSCULAR | Status: DC
  Administered 2011-12-31: 2 mg via INTRAVENOUS

## 2011-12-29 MED ORDER — HYDROMORPHONE HCL PF 2 MG/ML IJ SOLN
2.0000 mg | INTRAMUSCULAR | Status: DC | PRN
  Administered 2011-12-29: 2 mg via INTRAVENOUS
  Filled 2011-12-29: qty 1

## 2011-12-29 NOTE — Progress Notes (Signed)
Patient has not voide since 6 am, bladder scan done, result was 221 ml,no  c/o bladder pressure at this time, encouraged to drink water,will continue to monitor patient,will endorsed accordingly.

## 2011-12-29 NOTE — Progress Notes (Addendum)
[facility] PROGRESS NOTE  [name] ZOX:[id number] DOB: [birth year] DOA: 12/26/2011 PCP: Thayer Headings, MD  Assessment/Plan: Principal Problem:  *Acute pancreatitis Active Problems:  Alcohol abuse  Epigastric abdominal pain  Leukocytosis  Hyponatremia    1. Acute. Pancreatitis lipase improving,Liver function otherwise normal. Descalate pain medications because of low blood pressure, 2. Acute renal insufficiency , improving, continue generous IV hydration, likely in the setting of hypotension . We'll discontinue Toradol 3. Leukocytosis now resolved likely stress response no evidence of abscess on CT scan 4. Hypomagnesemia replete 5. Alcohol dependence continue CIWA protocol, appears to be in withdrawal, confused, hallucinating Code chief insufficiency Status: full  Family Communication: family updated about patient's clinical progress  Disposition Plan: Try to advance diet as tolerated  Brief narrative:  56 y.o. female who presents with complaints of epigastric ABD Pain which started in the AM along with nausea and vomiting. She reports not being able to eat or drink due to her pain and nausea. She rated her pain as a 10/10. She developed fevers and chills in the afternoon. She has a history of Alcohol abuse but reports that she has not been able to drink more than a few ounces of alcohol due to her pancreatitis. Her last drink was 2 days ago  Consultants:  none  Procedures:  none  Antibiotics:  none  HPI/Subjective:  still complaining of pain   Objective: Filed Vitals:   12/29/11 0139 12/29/11 0537 12/29/11 1010 12/29/11 1153  BP: 98/61 108/57 119/56 111/64  Pulse: 98 109 110 105  Temp: 98.6 F (37 C) 99.4 F (37.4 C)    TempSrc: Oral Oral    Resp: 19 18    Height:      Weight:      SpO2: 90% 91%      Intake/Output Summary (Last 24 hours) at 12/29/11 1217 Last data filed at 12/28/11 1300  Gross per 24 hour  Intake   1556 ml  Output      0 ml    Net   1556 ml    Exam:  HENT:  Head: Atraumatic.  Nose: Nose normal.  Mouth/Throat: Oropharynx is clear and moist.  Eyes: Conjunctivae are normal. Pupils are equal, round, and reactive to light. No scleral icterus.  Neck: Neck supple. No tracheal deviation present.  Cardiovascular: Normal rate, regular rhythm, normal heart sounds and intact distal pulses.  Pulmonary/Chest: Effort normal and breath sounds normal. No respiratory distress.  Abdominal: Soft. Normal appearance and bowel sounds are normal. She exhibits no distension. There is no tenderness.  Musculoskeletal: She exhibits no edema and no tenderness.  Neurological: She is alert. No cranial nerve deficit.    Data Reviewed: Basic Metabolic Panel:  Lab 12/29/11 8119 12/28/11 0342 12/27/11 0419 12/26/11 2220  NA 132* 134* 135 133*  K 4.2 5.0 4.2 3.8  CL 102 103 101 97  CO2 19 20 25 21   GLUCOSE 112* 111* 114* 174*  BUN 34* 25* 13 11  CREATININE 1.06 1.34* 0.83 0.67  CALCIUM 7.9* 8.0* 8.2* 9.1  MG -- -- 1.4* --  PHOS -- -- -- --    Liver Function Tests:  Lab 12/29/11 0345 12/28/11 0342 12/26/11 2220  AST 26 24 29   ALT 16 18 26   ALKPHOS 80 76 96  BILITOT 0.3 0.4 0.7  PROT 5.6* 6.0 7.3  ALBUMIN 2.1* 2.3* 3.3*    Lab 12/29/11 0345 12/28/11 0342 12/26/11 2220  LIPASE 126* 179* 177*  AMYLASE -- -- --   No  results found for this basename: AMMONIA:5 in the last 168 hours  CBC:  Lab 12/29/11 0345 12/28/11 0342 12/27/11 0419 12/26/11 2220  WBC 10.5 6.8 11.0* 15.8*  NEUTROABS -- -- -- --  HGB 11.4* 12.0 11.8* 13.3  HCT 37.1 38.5 37.7 41.2  MCV 90.9 90.4 90.0 88.0  PLT 288 289 255 297    Cardiac Enzymes: No results found for this basename: CKTOTAL:5,CKMB:5,CKMBINDEX:5,TROPONINI:5 in the last 168 hours BNP (last 3 results) No results found for this basename: PROBNP:3 in the last 8760 hours   CBG: No results found for this basename: GLUCAP:5 in the last 168 hours  Recent Results (from the past 240  hour(s))  URINE CULTURE     Status: Normal   Collection Time   12/27/11  9:37 PM      Component Value Range Status Comment   Specimen Description URINE, RANDOM   Final    Special Requests NONE   Final    Culture  Setup Time 12/28/2011 09:31   Final    Colony Count 45,000 COLONIES/ML   Final    Culture     Final    Value: Multiple bacterial morphotypes present, none predominant. Suggest appropriate recollection if clinically indicated.   Report Status 12/29/2011 FINAL   Final      Studies: Ct Abdomen Pelvis W Contrast  12/27/2011  *RADIOLOGY REPORT*  Clinical Data: Pain.  Pancreatitis.  White cell count 15.8.  Lipase 177.  Field  CT ABDOMEN AND PELVIS WITH CONTRAST  Technique:  Multidetector CT imaging of the abdomen and pelvis was performed following the standard protocol during bolus administration of intravenous contrast.  Contrast: OMNIPAQUE IOHEXOL 300 MG/ML  SOLN  Comparison: 09/27/2011.  Findings: Atelectasis or infiltration in both lung bases.  Large esophageal hiatal hernia.  There is extensive inflammatory infiltration and edema in the upper abdominal fat surrounding the pancreas and extending into the mesenteric and omental regions.  Changes are consistent with progressing acute pancreatitis.  Peripancreatic fluid collections are again demonstrated consistent with pseudocyst, similar to previous study.  Mild pancreatic ductal dilatation.  There appears to be homogeneous enhancement of the pancreatic parenchyma without evidence of pancreatic necrosis.  No focal abscess.  Mild intra and extrahepatic bile duct dilatation. Portal and mesenteric vessels appear patent.  Surgical absence of the gallbladder.  The liver, spleen, adrenal glands, kidneys, and retroperitoneal lymph nodes are unremarkable.  Calcification of the abdominal aorta without aneurysm.  The stomach, small bowel, and colon are not distended.  No free air in the abdomen.  Pelvis:  Small amount of free fluid in the pelvis,  likely related to inflammatory process.  No loculated fluid collections.  Bladder is decompressed.  The uterus and adnexal structures are not enlarged.  The appendix is normal.  No diverticulitis.  No significant lymphadenopathy in the pelvis.  IMPRESSION: Significant increase in upper abdominal inflammatory changes and edema consistent with progression of acute pancreatitis.  Fluid collections consistent with pseudocyst appears stable.   Original Report Authenticated By: Marlon Pel, M.D.    Dg Abd Acute W/chest  12/26/2011  *RADIOLOGY REPORT*  Clinical Data: Epigastric pain and nausea.  ACUTE ABDOMEN SERIES (ABDOMEN 2 VIEW & CHEST 1 VIEW)  Comparison: CT abdomen and pelvis 09/27/2011.  Findings: Shallow inspiration with atelectasis or infiltration in the lung bases.  Normal heart size and pulmonary vascularity.  No blunting of costophrenic angles.  No pneumothorax.  Mediastinal contours are intact.  Calcification of the aorta.  Esophageal hiatal hernia  behind the heart.  Scattered gas and stool in the colon.  No small or large bowel distension.  No abnormal air fluid levels.  No free air in the abdomen.  Surgical clips in the right upper quadrant.  Degenerative changes in the spine and hips.  IMPRESSION: Shallow inspiration with infiltration or atelectasis in the lung bases.  Esophageal hiatal hernia.  Nonobstructing bowel gas pattern.   Original Report Authenticated By: Marlon Pel, M.D.     Scheduled Meds:   . antiseptic oral rinse  15 mL Mouth Rinse BID  . enoxaparin (LOVENOX) injection  40 mg Subcutaneous Q24H  . folic acid  1 mg Oral Daily  . LORazepam  0-4 mg Intravenous Q6H   Followed by  . LORazepam  0-4 mg Intravenous Q12H  . multivitamin with minerals  1 tablet Oral Daily  . oxyCODONE  15 mg Oral Q12H  . thiamine  100 mg Oral Daily   Or  . thiamine  100 mg Intravenous Daily   Continuous Infusions:   . sodium chloride 150 mL/hr at 12/29/11 1020    Principal Problem:   *Acute pancreatitis Active Problems:  Alcohol abuse  Epigastric abdominal pain  Leukocytosis  Hyponatremia    Time spent: 40 minutes   [facility]  [facility] Pager [phone]. If 8PM-8AM, please contact night-coverage at [email or web address], password [facility] 12/29/2011, 12:17 PM  LOS: 3 days

## 2011-12-29 NOTE — Progress Notes (Signed)
Pt very restless and disoriented.  Multiple attempts to climb out of bed, stating she is going to the "cook off."  Efforts to re-orient pt without success. Pt oriented to self only.   Pt moved to room closest to nurses' station. Safety sitter at bedside. dph

## 2011-12-29 NOTE — Progress Notes (Signed)
Attempted to assess patient to discuss her etoh use/abuse however she was found to be confused/disoriented, attempting to get out of bed, etc. Patient thinks she is "cooking" for her son, Reuel Boom, who is having a party- she is looking for knives and wants to cook baked beans. RN advised of above and I have made Care Coordinator aware of concerns related to patient being at end of hallway and fall risk. Will follow up tomorrow to further assess and assist with plans. Reece Levy, MSW, Theresia Majors [phone]

## 2011-12-30 ENCOUNTER — Inpatient Hospital Stay (HOSPITAL_COMMUNITY): Payer: MEDICAID

## 2011-12-30 LAB — COMPREHENSIVE METABOLIC PANEL
Alkaline Phosphatase: 80 U/L (ref 39–117)
BUN: 37 mg/dL — ABNORMAL HIGH (ref 6–23)
Chloride: 105 mEq/L (ref 96–112)
Creatinine, Ser: 0.98 mg/dL (ref 0.50–1.10)
GFR calc Af Amer: 73 mL/min — ABNORMAL LOW (ref >90)
Glucose, Bld: 124 mg/dL — ABNORMAL HIGH (ref 70–99)
Potassium: 3.9 mEq/L (ref 3.5–5.1)
Total Bilirubin: 0.2 mg/dL — ABNORMAL LOW (ref 0.3–1.2)

## 2011-12-30 LAB — MAGNESIUM: Magnesium: 3 mg/dL — ABNORMAL HIGH (ref 1.5–2.5)

## 2011-12-30 MED ORDER — HYDROMORPHONE HCL PF 2 MG/ML IJ SOLN
2.0000 mg | Freq: Four times a day (QID) | INTRAMUSCULAR | Status: DC | PRN
  Administered 2011-12-31: 2 mg via INTRAVENOUS
  Filled 2011-12-30 (×2): qty 1

## 2011-12-30 MED ORDER — PIPERACILLIN-TAZOBACTAM 3.375 G IVPB
3.3750 g | Freq: Three times a day (TID) | INTRAVENOUS | Status: DC
  Administered 2011-12-30 – 2012-01-23 (×72): 3.375 g via INTRAVENOUS
  Filled 2011-12-30 (×76): qty 50

## 2011-12-30 MED ORDER — BISACODYL 10 MG RE SUPP
10.0000 mg | Freq: Once | RECTAL | Status: AC
  Administered 2011-12-30: 10 mg via RECTAL
  Filled 2011-12-30: qty 1

## 2011-12-30 MED ORDER — BISACODYL 10 MG RE SUPP: 10.0000 mg | Freq: Every day | RECTAL | Status: DC | PRN

## 2011-12-30 MED ORDER — OXYCODONE HCL 10 MG PO TB12
10.0000 mg | ORAL_TABLET | Freq: Two times a day (BID) | ORAL | Status: DC
  Administered 2011-12-30: 10 mg via ORAL
  Filled 2011-12-30 (×2): qty 1

## 2011-12-30 NOTE — Progress Notes (Signed)
Pt had a bladder scan of at 0130 am. Notified MD on call. Had an order to insert foley. Had 900 cc of dark tea colored urine upon insetion. Pt tolerated well and stated felt much better. Will continue to monitor

## 2011-12-30 NOTE — Plan of Care (Signed)
Problem: Phase I Progression Outcomes Goal: Voiding-avoid urinary catheter unless indicated Outcome: Not Met (add Reason) ACUTE URINARY RETENTION ON 12/29/11

## 2011-12-30 NOTE — Progress Notes (Signed)
ANTIBIOTIC CONSULT NOTE - INITIAL  Pharmacy Consult for Zosyn Indication: Pancreatitis  Allergies  Allergen Reactions  . Ciprofloxacin Nausea Only    REACTION: nausea    Patient Measurements: Height: 5\' 6"  (167.6 cm) Weight: 151 lb 10.8 oz (68.8 kg) IBW/kg (Calculated) : 59.3   Vital Signs: Temp: 101.2 F (38.4 C) (09/11 0755) Temp src: Axillary (09/11 0755) BP: 118/58 mmHg (09/11 0755) Pulse Rate: 109  (09/11 0755) Intake/Output from previous day: 09/10 0701 - 09/11 0700 In: 1952 [P.O.:60; I.V.:1872] Out: 1000 [Urine:1000] Intake/Output from this shift:    Labs:  Basename 12/30/11 0335 12/29/11 0345 12/28/11 0342  WBC -- 10.5 6.8  HGB -- 11.4* 12.0  PLT -- 288 289  LABCREA -- -- --  CREATININE 0.98 1.06 1.34*   Estimated Creatinine Clearance: 60 ml/min (by C-G formula based on Cr of 0.98). No results found for this basename: VANCOTROUGH:2,VANCOPEAK:2,VANCORANDOM:2,GENTTROUGH:2,GENTPEAK:2,GENTRANDOM:2,TOBRATROUGH:2,TOBRAPEAK:2,TOBRARND:2,AMIKACINPEAK:2,AMIKACINTROU:2,AMIKACIN:2, in the last 72 hours   Microbiology: Recent Results (from the past 720 hour(s))  URINE CULTURE     Status: Normal   Collection Time   12/27/11  9:37 PM      Component Value Range Status Comment   Specimen Description URINE, RANDOM   Final    Special Requests NONE   Final    Culture  Setup Time 12/28/2011 09:31   Final    Colony Count 45,000 COLONIES/ML   Final    Culture     Final    Value: Multiple bacterial morphotypes present, none predominant. Suggest appropriate recollection if clinically indicated.   Report Status 12/29/2011 FINAL   Final     Medical History: Past Medical History  Diagnosis Date  . GERD (gastroesophageal reflux disease)   . Rosacea   . Elevated liver function tests   . Wears glasses   . Chronic diarrhea   . Alcohol abuse     H/o withdrawal   . PONV (postoperative nausea and vomiting)   . Pancreatitis     Medications:  Anti-infectives     Start      Dose/Rate Route Frequency Ordered Stop   12/30/11 1000   piperacillin-tazobactam (ZOSYN) IVPB 3.375 g        3.375 g 12.5 mL/hr over 240 Minutes Intravenous Every 8 hours 12/30/11 0901           Assessment:  55 yof with acute pancreatitis/alcohol related  Fever 101.2 this am, Cxray with effusions, Ct of abdomen notes inflammation, pancreatic pseudocyst.  Renal insufficiency on admit, requiring foley for urine output.  Urine Cx: multiple species. Blood cx ordered.  Begin Zosyn  Goal of Therapy:  Antibiotics/medications appropriate for renal function.  Plan:  Zosyn 3.375gm q8h-4 hr infusion Monitor renal function closely.  Otho Bellows PharmD Pager [phone] 12/30/2011,9:53 AM

## 2011-12-30 NOTE — Progress Notes (Signed)
[facility] PROGRESS NOTE  [name] ZOX:[id number] DOB: 1955-12-10 DOA: 12/26/2011 PCP: Thayer Headings, MD  Assessment/Plan: Principal Problem:  *Acute pancreatitis Active Problems:  Alcohol abuse  Epigastric abdominal pain  Leukocytosis  Hyponatremia    1. Acute. Pancreatitis lipase improving,Liver function otherwise normal. Descalate pain medications because of low blood pressure, make the patient n.p.o. because of fever 2. Acute renal insufficiency , improving, continue generous IV hydration, likely in the setting of hypotension . We'll discontinue Toradol 3. Leukocytosis now resolved likely stress response no evidence of abscess on CT scan 4. Hypomagnesemia replete 5. Alcohol dependence continue CIWA protocol, appears to be in withdrawal, confused, hallucinating 6. Fever possible aspiration in the setting of hallucinations altered mental status. Blood culture x2, chest x-ray, start Zosyn due to concern about aspiration pneumonia, consider CT abdomen pelvis again to rule out necrotizing pancreatitis. Code chief insufficiency Status: full  Family Communication: family updated about patient's clinical progress  Disposition Plan: Not stable for discharge   Brief narrative:  56 y.o. female who presents with complaints of epigastric ABD Pain which started in the AM along with nausea and vomiting. She reports not being able to eat or drink due to her pain and nausea. She rated her pain as a 10/10. She developed fevers and chills in the afternoon. She has a history of Alcohol abuse but reports that she has not been able to drink more than a few ounces of alcohol due to her pancreatitis. Her last drink was 2 days ago  Consultants:  none  Procedures:  none  Antibiotics:  none   HPI/Subjective: Pt very restless and disoriented. Multiple attempts to climb out of bed, stating she is going to the "cook off." Efforts to re-orient pt without success. Pt oriented to self only.   Pt moved to room closest to nurses' station. Safety sitter at bedside. dph  Objective: Filed Vitals:   12/29/11 2302 12/30/11 0030 12/30/11 0620 12/30/11 0755  BP:   102/52 118/58  Pulse: 110 109 110 109  Temp: 99.6 F (37.6 C)  103 F (39.4 C) 101.2 F (38.4 C)  TempSrc: Oral  Oral Axillary  Resp: 20  20 20   Height:      Weight:      SpO2: 86%  85% 91%    Intake/Output Summary (Last 24 hours) at 12/30/11 0844 Last data filed at 12/30/11 0700  Gross per 24 hour  Intake   1952 ml  Output   1000 ml  Net    952 ml    Exam:  HENT:  Head: Atraumatic.  Nose: Nose normal.  Mouth/Throat: Oropharynx is clear and moist.  Eyes: Conjunctivae are normal. Pupils are equal, round, and reactive to light. No scleral icterus.  Neck: Neck supple. No tracheal deviation present.  Cardiovascular: Normal rate, regular rhythm, normal heart sounds and intact distal pulses.  Pulmonary/Chest: Effort normal and breath sounds normal. No respiratory distress.  Abdominal: Soft. Normal appearance and bowel sounds are normal. She exhibits no distension. There is no tenderness.  Musculoskeletal: She exhibits no edema and no tenderness.  Neurological: She is alert. No cranial nerve deficit.    Data Reviewed: Basic Metabolic Panel:  Lab 12/30/11 8119 12/29/11 0345 12/28/11 0342 12/27/11 0419 12/26/11 2220  NA 133* 132* 134* 135 133*  K 3.9 4.2 5.0 4.2 3.8  CL 105 102 103 101 97  CO2 20 19 20 25 21   GLUCOSE 124* 112* 111* 114* 174*  BUN 37* 34* 25* 13 11  CREATININE  0.98 1.06 1.34* 0.83 0.67  CALCIUM 7.9* 7.9* 8.0* 8.2* 9.1  MG -- -- -- 1.4* --  PHOS -- -- -- -- --    Liver Function Tests:  Lab 12/30/11 0335 12/29/11 0345 12/28/11 0342 12/26/11 2220  AST 18 26 24 29   ALT 11 16 18 26   ALKPHOS 80 80 76 96  BILITOT 0.2* 0.3 0.4 0.7  PROT 5.2* 5.6* 6.0 7.3  ALBUMIN 1.7* 2.1* 2.3* 3.3*    Lab 12/29/11 0345 12/28/11 0342 12/26/11 2220  LIPASE 126* 179* 177*  AMYLASE -- -- --   No  results found for this basename: AMMONIA:5 in the last 168 hours  CBC:  Lab 12/29/11 0345 12/28/11 0342 12/27/11 0419 12/26/11 2220  WBC 10.5 6.8 11.0* 15.8*  NEUTROABS -- -- -- --  HGB 11.4* 12.0 11.8* 13.3  HCT 37.1 38.5 37.7 41.2  MCV 90.9 90.4 90.0 88.0  PLT 288 289 255 297    Cardiac Enzymes: No results found for this basename: CKTOTAL:5,CKMB:5,CKMBINDEX:5,TROPONINI:5 in the last 168 hours BNP (last 3 results) No results found for this basename: PROBNP:3 in the last 8760 hours   CBG: No results found for this basename: GLUCAP:5 in the last 168 hours  Recent Results (from the past 240 hour(s))  URINE CULTURE     Status: Normal   Collection Time   12/27/11  9:37 PM      Component Value Range Status Comment   Specimen Description URINE, RANDOM   Final    Special Requests NONE   Final    Culture  Setup Time 12/28/2011 09:31   Final    Colony Count 45,000 COLONIES/ML   Final    Culture     Final    Value: Multiple bacterial morphotypes present, none predominant. Suggest appropriate recollection if clinically indicated.   Report Status 12/29/2011 FINAL   Final      Studies: Ct Abdomen Pelvis W Contrast  12/27/2011  *RADIOLOGY REPORT*  Clinical Data: Pain.  Pancreatitis.  White cell count 15.8.  Lipase 177.  Field  CT ABDOMEN AND PELVIS WITH CONTRAST  Technique:  Multidetector CT imaging of the abdomen and pelvis was performed following the standard protocol during bolus administration of intravenous contrast.  Contrast: OMNIPAQUE IOHEXOL 300 MG/ML  SOLN  Comparison: 09/27/2011.  Findings: Atelectasis or infiltration in both lung bases.  Large esophageal hiatal hernia.  There is extensive inflammatory infiltration and edema in the upper abdominal fat surrounding the pancreas and extending into the mesenteric and omental regions.  Changes are consistent with progressing acute pancreatitis.  Peripancreatic fluid collections are again demonstrated consistent with pseudocyst,  similar to previous study.  Mild pancreatic ductal dilatation.  There appears to be homogeneous enhancement of the pancreatic parenchyma without evidence of pancreatic necrosis.  No focal abscess.  Mild intra and extrahepatic bile duct dilatation. Portal and mesenteric vessels appear patent.  Surgical absence of the gallbladder.  The liver, spleen, adrenal glands, kidneys, and retroperitoneal lymph nodes are unremarkable.  Calcification of the abdominal aorta without aneurysm.  The stomach, small bowel, and colon are not distended.  No free air in the abdomen.  Pelvis:  Small amount of free fluid in the pelvis, likely related to inflammatory process.  No loculated fluid collections.  Bladder is decompressed.  The uterus and adnexal structures are not enlarged.  The appendix is normal.  No diverticulitis.  No significant lymphadenopathy in the pelvis.  IMPRESSION: Significant increase in upper abdominal inflammatory changes and edema consistent with  progression of acute pancreatitis.  Fluid collections consistent with pseudocyst appears stable.   Original Report Authenticated By: Marlon Pel, M.D.    Dg Abd Acute W/chest  12/26/2011  *RADIOLOGY REPORT*  Clinical Data: Epigastric pain and nausea.  ACUTE ABDOMEN SERIES (ABDOMEN 2 VIEW & CHEST 1 VIEW)  Comparison: CT abdomen and pelvis 09/27/2011.  Findings: Shallow inspiration with atelectasis or infiltration in the lung bases.  Normal heart size and pulmonary vascularity.  No blunting of costophrenic angles.  No pneumothorax.  Mediastinal contours are intact.  Calcification of the aorta.  Esophageal hiatal hernia behind the heart.  Scattered gas and stool in the colon.  No small or large bowel distension.  No abnormal air fluid levels.  No free air in the abdomen.  Surgical clips in the right upper quadrant.  Degenerative changes in the spine and hips.  IMPRESSION: Shallow inspiration with infiltration or atelectasis in the lung bases.  Esophageal hiatal  hernia.  Nonobstructing bowel gas pattern.   Original Report Authenticated By: Marlon Pel, M.D.     Scheduled Meds:   . antiseptic oral rinse  15 mL Mouth Rinse BID  . enoxaparin (LOVENOX) injection  40 mg Subcutaneous Q24H  . folic acid  1 mg Oral Daily  . levalbuterol  1.25 mg Nebulization Once  . LORazepam  0-4 mg Intravenous Q6H   Followed by  . LORazepam  0-4 mg Intravenous Q12H  . multivitamin with minerals  1 tablet Oral Daily  . oxyCODONE  15 mg Oral Q12H  . thiamine  100 mg Oral Daily   Or  . thiamine  100 mg Intravenous Daily   Continuous Infusions:   . sodium chloride 150 mL/hr at 12/29/11 1758    Principal Problem:  *Acute pancreatitis Active Problems:  Alcohol abuse  Epigastric abdominal pain  Leukocytosis  Hyponatremia    Time spent: 40 minutes   [place]  [facility] Pager [phone]. If 8PM-8AM, please contact night-coverage at [email or web address], password [facility] 12/30/2011, 8:44 AM  LOS: 4 days

## 2011-12-30 NOTE — Evaluation (Signed)
Clinical/Bedside Swallow Evaluation Patient Details  Name: [name] MRN: [id number] Date of Birth: 12/27/1955  Today's Date: 12/30/2011 Time: 0950-1020 SLP Time Calculation (min): 30 min  Past Medical History:  Past Medical History  Diagnosis Date  . GERD (gastroesophageal reflux disease)   . Rosacea   . Elevated liver function tests   . Wears glasses   . Chronic diarrhea   . Alcohol abuse     H/o withdrawal   . PONV (postoperative nausea and vomiting)   . Pancreatitis    Past Surgical History:  Past Surgical History  Procedure Date  . Cholecystectomy   . Shoulder surgery [id number]  . Diagnostic mammogram 2008  . Tubal ligation   . Colonoscopy Never  . Esophagogastroduodenoscopy 03/21/2011    Procedure: ESOPHAGOGASTRODUODENOSCOPY (EGD);  Surgeon: Yancey Flemings, MD;  Location: [facility] ENDOSCOPY;  Service: Endoscopy;  Laterality: N/A;  Patient may need to be done at bedside tomorrow depending on status  . Ercp 10/06/2011    Procedure: ENDOSCOPIC RETROGRADE CHOLANGIOPANCREATOGRAPHY (ERCP);  Surgeon: Theda Belfast, MD;  Location: [facility] ENDOSCOPY;  Service: Endoscopy;  Laterality: N/A;   HPI:  [name] is a 56 y.o. female who presents with complaints of epigastric ABD Pain which started in the AM along with nausea and vomiting.  She reports not being able to eat or drink due to her pain and nausea. She rated her pain as a 10/10.  She developed fevers and chills in the afternoon.  She has a history of Alcohol abuse but reports that she has not been able to drink more than a few ounces of alcohol due to her pancreatitis.  Her last drink was 2 days ago.  Patient has h/o  hiatal hernia and GERD.   Assessment / Plan / Recommendation Clinical Impression  No overt s/s of aspiration or oropharyngeal dysphagia at bedside.  Patient does have a history of Hiatal Hernia and GERD.  Question if aspiration of reflux may be source of fever and infiltrates.  Consider esophageal w/u when  patient is able to cooperate.      Aspiration Risk  Mild    Diet Recommendation Thin liquid (Continue clear liquids as tolerated until esophageal w/u.)   Liquid Administration via: Straw Medication Administration: Crushed with puree Supervision: Full supervision/cueing for compensatory strategies Compensations: Slow rate;Small sips/bites Postural Changes and/or Swallow Maneuvers: Seated upright 90 degrees    Other  Recommendations Recommended Consults: Consider esophageal assessment;Consider GI evaluation Oral Care Recommendations: Oral care QID Other Recommendations: Clarify dietary restrictions   Follow Up Recommendations  None    Frequency and Duration        Pertinent Vitals/Pain Abd. Pain.  Patient unable to state level.  RN aware.    SLP Swallow Goals     Swallow Study Prior Functional Status       General HPI: [name] is a 56 y.o. female who presents with complaints of epigastric ABD Pain which started in the AM along with nausea and vomiting.  She reports not being able to eat or drink due to her pain and nausea. She rated her pain as a 10/10.  She developed fevers and chills in the afternoon.  She has a history of Alcohol abuse but reports that she has not been able to drink more than a few ounces of alcohol due to her pancreatitis.  Her last drink was 2 days ago.  Patient has h/o  hiatal hernia and GERD. Type of Study: Bedside swallow evaluation Diet Prior  to this Study: Thin liquids (No diet orders, but nursing has given clear liquids.) Temperature Spikes Noted: Yes Respiratory Status: Supplemental O2 delivered via (comment) History of Recent Intubation: No Behavior/Cognition: Confused;Agitated;Impulsive;Distractible;Requires cueing;Doesn't follow directions;Decreased sustained attention Oral Cavity - Dentition: Adequate natural dentition Self-Feeding Abilities: Needs assist Patient Positioning: Upright in bed Baseline Vocal Quality: Clear Volitional  Cough: Cognitively unable to elicit Volitional Swallow: Able to elicit    Oral/Motor/Sensory Function Overall Oral Motor/Sensory Function: Appears within functional limits for tasks assessed   Ice Chips Ice chips: Not tested   Thin Liquid Thin Liquid: Within functional limits Presentation: Straw;Cup    Nectar Thick Nectar Thick Liquid: Not tested   Honey Thick Honey Thick Liquid: Not tested   Puree Puree: Within functional limits Presentation: Spoon   Solid   GO    Solid: Within functional limits       Chrisandra Wiemers T 12/30/2011,10:36 AM

## 2011-12-30 NOTE — Progress Notes (Signed)
Patient remains confused, restless and unable to participate in a discussion/assessment with me. Sitter at bedside who concurs- I will continue to follow to further assess and asssit. [name], MSW, [facility] [phone]

## 2011-12-31 ENCOUNTER — Inpatient Hospital Stay (HOSPITAL_COMMUNITY): Payer: MEDICAID

## 2011-12-31 DIAGNOSIS — R109 Unspecified abdominal pain: Secondary | ICD-10-CM

## 2011-12-31 DIAGNOSIS — R579 Shock, unspecified: Secondary | ICD-10-CM | POA: Diagnosis present

## 2011-12-31 DIAGNOSIS — J80 Acute respiratory distress syndrome: Secondary | ICD-10-CM | POA: Diagnosis present

## 2011-12-31 DIAGNOSIS — J9589 Other postprocedural complications and disorders of respiratory system, not elsewhere classified: Secondary | ICD-10-CM

## 2011-12-31 DIAGNOSIS — J9600 Acute respiratory failure, unspecified whether with hypoxia or hypercapnia: Secondary | ICD-10-CM | POA: Diagnosis present

## 2011-12-31 DIAGNOSIS — F10231 Alcohol dependence with withdrawal delirium: Secondary | ICD-10-CM

## 2011-12-31 DIAGNOSIS — M7989 Other specified soft tissue disorders: Secondary | ICD-10-CM

## 2011-12-31 DIAGNOSIS — K859 Acute pancreatitis without necrosis or infection, unspecified: Secondary | ICD-10-CM

## 2011-12-31 DIAGNOSIS — F1010 Alcohol abuse, uncomplicated: Secondary | ICD-10-CM

## 2011-12-31 DIAGNOSIS — D62 Acute posthemorrhagic anemia: Secondary | ICD-10-CM

## 2011-12-31 LAB — CBC WITH DIFFERENTIAL/PLATELET
Basophils Relative: 0 % (ref 0–1)
Eosinophils Relative: 0 % (ref 0–5)
HCT: 29.7 % — ABNORMAL LOW (ref 36.0–46.0)
Hemoglobin: 10 g/dL — ABNORMAL LOW (ref 12.0–15.0)
Lymphs Abs: 1.1 10*3/uL (ref 0.7–4.0)
MCV: 83.9 fL (ref 78.0–100.0)
Monocytes Relative: 10 % (ref 3–12)
Platelets: 253 10*3/uL (ref 150–400)
RBC: 3.54 MIL/uL — ABNORMAL LOW (ref 3.87–5.11)
WBC: 15 10*3/uL — ABNORMAL HIGH (ref 4.0–10.5)

## 2011-12-31 LAB — COMPREHENSIVE METABOLIC PANEL
ALT: 9 U/L (ref 0–35)
AST: 22 U/L (ref 0–37)
AST: 18 U/L (ref 0–37)
Albumin: 1.6 g/dL — ABNORMAL LOW (ref 3.5–5.2)
Albumin: 1.6 g/dL — ABNORMAL LOW (ref 3.5–5.2)
Alkaline Phosphatase: 103 U/L (ref 39–117)
BUN: 25 mg/dL — ABNORMAL HIGH (ref 6–23)
Calcium: 7.9 mg/dL — ABNORMAL LOW (ref 8.4–10.5)
Chloride: 107 mEq/L (ref 96–112)
Creatinine, Ser: 0.69 mg/dL (ref 0.50–1.10)
GFR calc non Af Amer: >90 mL/min (ref >90)
Potassium: 3.9 mEq/L (ref 3.5–5.1)
Sodium: 135 mEq/L (ref 135–145)
Total Bilirubin: 0.3 mg/dL (ref 0.3–1.2)
Total Protein: 5.3 g/dL — ABNORMAL LOW (ref 6.0–8.3)
Total Protein: 5.4 g/dL — ABNORMAL LOW (ref 6.0–8.3)

## 2011-12-31 LAB — CBC
HCT: 30.2 % — ABNORMAL LOW (ref 36.0–46.0)
MCH: 27.6 pg (ref 26.0–34.0)
MCV: 85.1 fL (ref 78.0–100.0)
Platelets: 239 10*3/uL (ref 150–400)
RBC: 3.55 MIL/uL — ABNORMAL LOW (ref 3.87–5.11)
RDW: 15.8 % — ABNORMAL HIGH (ref 11.5–15.5)
WBC: 13.1 10*3/uL — ABNORMAL HIGH (ref 4.0–10.5)

## 2011-12-31 LAB — MRSA PCR SCREENING: MRSA by PCR: NEGATIVE

## 2011-12-31 LAB — BLOOD GAS, ARTERIAL
Acid-base deficit: 3.2 mmol/L — ABNORMAL HIGH (ref 0.0–2.0)
Acid-base deficit: 3.8 mmol/L — ABNORMAL HIGH (ref 0.0–2.0)
Bicarbonate: 20.1 mEq/L (ref 20.0–24.0)
Bicarbonate: 21.8 mEq/L (ref 20.0–24.0)
Bicarbonate: 19.1 mEq/L — ABNORMAL LOW (ref 20.0–24.0)
Drawn by: 352351
FIO2: 0.5 %
FIO2: 100 %
O2 Saturation: 88.3 %
Patient temperature: 100.8
Patient temperature: 98.6
TCO2: 18.7 mmol/L (ref 0–100)
TCO2: 17.6 mmol/L (ref 0–100)
pCO2 arterial: 33.4 mmHg — ABNORMAL LOW (ref 35.0–45.0)
pCO2 arterial: 28.6 mmHg — ABNORMAL LOW (ref 35.0–45.0)
pCO2 arterial: 28.6 mmHg — ABNORMAL LOW (ref 35.0–45.0)
pH, Arterial: 7.403 (ref 7.350–7.450)
pH, Arterial: 7.494 — ABNORMAL HIGH (ref 7.350–7.450)
pO2, Arterial: 59 mmHg — ABNORMAL LOW (ref 80.0–100.0)
pO2, Arterial: 81.4 mmHg (ref 80.0–100.0)

## 2011-12-31 LAB — POTASSIUM: Potassium: 3.8 mEq/L (ref 3.5–5.1)

## 2011-12-31 LAB — GLUCOSE, CAPILLARY
Glucose-Capillary: 129 mg/dL — ABNORMAL HIGH (ref 70–99)
Glucose-Capillary: 97 mg/dL (ref 70–99)

## 2011-12-31 LAB — AMYLASE: Amylase: 187 U/L — ABNORMAL HIGH (ref 0–105)

## 2011-12-31 LAB — LIPASE, BLOOD: Lipase: 58 U/L (ref 11–59)

## 2011-12-31 MED ORDER — POTASSIUM CHLORIDE 10 MEQ/50ML IV SOLN
10.0000 meq | INTRAVENOUS | Status: AC
  Administered 2011-12-31 (×4): 10 meq via INTRAVENOUS
  Filled 2011-12-31: qty 200

## 2011-12-31 MED ORDER — FENTANYL CITRATE 0.05 MG/ML IJ SOLN
INTRAMUSCULAR | Status: AC
  Administered 2011-12-31: 100 ug
  Filled 2011-12-31: qty 2

## 2011-12-31 MED ORDER — INSULIN ASPART 100 UNIT/ML ~~LOC~~ SOLN
2.0000 [IU] | SUBCUTANEOUS | Status: DC
  Administered 2011-12-31 – 2012-01-02 (×3): 2 [IU] via SUBCUTANEOUS
  Administered 2012-01-02: 3 [IU] via SUBCUTANEOUS
  Administered 2012-01-02 – 2012-01-03 (×2): 2 [IU] via SUBCUTANEOUS
  Administered 2012-01-04: 4 [IU] via SUBCUTANEOUS
  Administered 2012-01-04: 2 [IU] via SUBCUTANEOUS
  Administered 2012-01-04 (×2): 4 [IU] via SUBCUTANEOUS
  Administered 2012-01-04: 2 [IU] via SUBCUTANEOUS
  Administered 2012-01-05 (×3): 4 [IU] via SUBCUTANEOUS
  Administered 2012-01-05: 2 [IU] via SUBCUTANEOUS
  Administered 2012-01-05: 4 [IU] via SUBCUTANEOUS
  Administered 2012-01-05 – 2012-01-06 (×3): 2 [IU] via SUBCUTANEOUS
  Administered 2012-01-06 – 2012-01-07 (×6): 4 [IU] via SUBCUTANEOUS
  Administered 2012-01-07: 6 [IU] via SUBCUTANEOUS
  Administered 2012-01-07 (×2): 4 [IU] via SUBCUTANEOUS
  Administered 2012-01-07: 6 [IU] via SUBCUTANEOUS
  Administered 2012-01-08: 4 [IU] via SUBCUTANEOUS
  Administered 2012-01-08 (×3): 2 [IU] via SUBCUTANEOUS
  Administered 2012-01-08: 6 [IU] via SUBCUTANEOUS
  Administered 2012-01-08 – 2012-01-09 (×3): 4 [IU] via SUBCUTANEOUS
  Administered 2012-01-09: 2 [IU] via SUBCUTANEOUS
  Administered 2012-01-09: 4 [IU] via SUBCUTANEOUS
  Administered 2012-01-09: 2 [IU] via SUBCUTANEOUS
  Administered 2012-01-09 – 2012-01-10 (×2): 4 [IU] via SUBCUTANEOUS
  Administered 2012-01-10: 2 [IU] via SUBCUTANEOUS
  Administered 2012-01-10: 4 [IU] via SUBCUTANEOUS
  Administered 2012-01-10 – 2012-01-12 (×11): 2 [IU] via SUBCUTANEOUS
  Administered 2012-01-12: 4 [IU] via SUBCUTANEOUS
  Administered 2012-01-13: 2 [IU] via SUBCUTANEOUS
  Administered 2012-01-13: 4 [IU] via SUBCUTANEOUS
  Administered 2012-01-13 – 2012-01-15 (×15): 2 [IU] via SUBCUTANEOUS
  Administered 2012-01-16: 4 [IU] via SUBCUTANEOUS
  Administered 2012-01-16 – 2012-01-17 (×5): 2 [IU] via SUBCUTANEOUS
  Administered 2012-01-18: 4 [IU] via SUBCUTANEOUS

## 2011-12-31 MED ORDER — FENTANYL BOLUS VIA INFUSION
50.0000 ug | Freq: Four times a day (QID) | INTRAVENOUS | Status: DC | PRN
  Administered 2012-01-04: 100 ug via INTRAVENOUS
  Filled 2011-12-31: qty 100

## 2011-12-31 MED ORDER — MIDAZOLAM BOLUS VIA INFUSION
1.0000 mg | INTRAVENOUS | Status: DC | PRN
  Filled 2011-12-31: qty 2

## 2011-12-31 MED ORDER — BIOTENE DRY MOUTH MT LIQD
15.0000 mL | Freq: Four times a day (QID) | OROMUCOSAL | Status: DC
  Administered 2012-01-01 – 2012-02-07 (×97): 15 mL via OROMUCOSAL

## 2011-12-31 MED ORDER — SODIUM CHLORIDE 0.9 % IV SOLN
50.0000 ug/h | INTRAVENOUS | Status: DC
  Administered 2011-12-31: 150 ug/h via INTRAVENOUS
  Administered 2011-12-31 – 2012-01-01 (×2): 50 ug/h via INTRAVENOUS
  Administered 2012-01-01 – 2012-01-02 (×2): 175 ug/h via INTRAVENOUS
  Administered 2012-01-03: 150 ug/h via INTRAVENOUS
  Administered 2012-01-03: 175 ug/h via INTRAVENOUS
  Filled 2011-12-31 (×6): qty 50

## 2011-12-31 MED ORDER — FOLIC ACID 5 MG/ML IJ SOLN
1.0000 mg | Freq: Every day | INTRAMUSCULAR | Status: DC
  Administered 2012-01-01 – 2012-01-25 (×25): 1 mg via INTRAVENOUS
  Filled 2011-12-31 (×31): qty 0.2

## 2011-12-31 MED ORDER — FAMOTIDINE IN NACL 20-0.9 MG/50ML-% IV SOLN
20.0000 mg | Freq: Every day | INTRAVENOUS | Status: DC
  Administered 2011-12-31 – 2012-01-02 (×3): 20 mg via INTRAVENOUS
  Filled 2011-12-31 (×3): qty 50

## 2011-12-31 MED ORDER — SUCCINYLCHOLINE CHLORIDE 20 MG/ML IJ SOLN
INTRAMUSCULAR | Status: AC
  Filled 2011-12-31: qty 10

## 2011-12-31 MED ORDER — LIDOCAINE HCL (CARDIAC) 20 MG/ML IV SOLN
INTRAVENOUS | Status: AC
  Filled 2011-12-31: qty 5

## 2011-12-31 MED ORDER — MIDAZOLAM HCL 2 MG/2ML IJ SOLN
1.0000 mg | Freq: Once | INTRAMUSCULAR | Status: AC
  Administered 2011-12-31: 1 mg via INTRAVENOUS

## 2011-12-31 MED ORDER — ROCURONIUM BROMIDE 50 MG/5ML IV SOLN
INTRAVENOUS | Status: AC
  Administered 2011-12-31: 10 mg
  Filled 2011-12-31: qty 2

## 2011-12-31 MED ORDER — FUROSEMIDE 10 MG/ML IJ SOLN
40.0000 mg | Freq: Once | INTRAMUSCULAR | Status: AC
  Administered 2011-12-31: 40 mg via INTRAVENOUS
  Filled 2011-12-31: qty 4

## 2011-12-31 MED ORDER — FUROSEMIDE 10 MG/ML IJ SOLN
40.0000 mg | Freq: Once | INTRAMUSCULAR | Status: DC
  Filled 2011-12-31: qty 4

## 2011-12-31 MED ORDER — POTASSIUM CHLORIDE 10 MEQ/100ML IV SOLN: 10.0000 meq | INTRAVENOUS | Status: DC

## 2011-12-31 MED ORDER — ETOMIDATE 2 MG/ML IV SOLN
INTRAVENOUS | Status: AC
  Administered 2011-12-31: 20 mg
  Filled 2011-12-31: qty 20

## 2011-12-31 MED ORDER — MIDAZOLAM HCL 5 MG/ML IJ SOLN
INTRAMUSCULAR | Status: AC
  Administered 2011-12-31: 4 mg
  Filled 2011-12-31: qty 1

## 2011-12-31 MED ORDER — SODIUM CHLORIDE 0.9 % IV SOLN
2.0000 mg/h | INTRAVENOUS | Status: DC
  Administered 2011-12-31: 2 mg/h via INTRAVENOUS
  Administered 2012-01-01: 4 mg/h via INTRAVENOUS
  Administered 2012-01-02 (×2): 5 mg/h via INTRAVENOUS
  Administered 2012-01-02 – 2012-01-03 (×2): 4 mg/h via INTRAVENOUS
  Filled 2011-12-31 (×7): qty 10

## 2011-12-31 MED ORDER — VANCOMYCIN HCL 1000 MG IV SOLR
750.0000 mg | Freq: Two times a day (BID) | INTRAVENOUS | Status: DC
  Administered 2011-12-31 – 2012-01-02 (×6): 750 mg via INTRAVENOUS
  Filled 2011-12-31 (×7): qty 750

## 2011-12-31 MED ORDER — SODIUM CHLORIDE 0.9 % IV SOLN
1.0000 mg | Freq: Once | INTRAVENOUS | Status: AC
  Administered 2011-12-31: 1 mg via INTRAVENOUS
  Filled 2011-12-31: qty 0.2

## 2011-12-31 MED ORDER — CHLORHEXIDINE GLUCONATE 0.12 % MT SOLN
15.0000 mL | Freq: Two times a day (BID) | OROMUCOSAL | Status: DC
  Administered 2011-12-31 – 2012-02-09 (×57): 15 mL via OROMUCOSAL
  Filled 2011-12-31 (×80): qty 15

## 2011-12-31 NOTE — Procedures (Signed)
Intubation Procedure Note [name] [id number] Jul 13, 1955  Procedure: Intubation Indications: Respiratory insufficiency  Procedure Details Consent: Unable to obtain consent because of emergent medical necessity. Time Out: Verified patient identification, verified procedure, site/side was marked, verified correct patient position, special equipment/implants available, medications/allergies/relevent history reviewed, required imaging and test results available.  Performed  Maximum sterile technique was used including antiseptics, cap, gloves, hand hygiene and mask.  MAC    Evaluation Hemodynamic Status: BP stable throughout; O2 sats: stable throughout Patient's Current Condition: stable Complications: No apparent complications Patient did tolerate procedure well. Chest X-ray ordered to verify placement.  CXR: pending.   [name] 12/31/2011

## 2011-12-31 NOTE — Progress Notes (Signed)
[facility] PROGRESS NOTE  [name] WUJ:[id number] DOB: 1955/10/31 DOA: 12/26/2011 PCP: Thayer Headings, MD Principal Problem:  *Acute pancreatitis  Active Problems:  Alcohol abuse  Epigastric abdominal pain  Leukocytosis  Hyponatremia       Assessment/Plan:  1. Acute. Pancreatitis lipase improving,Liver function otherwise normal. Descalate pain medications because of low blood pressure, make the patient n.p.o. because of fever, CT shows ileus only, no abcess , 2. Acute renal insufficiency , improving, continue generous IV hydration, likely in the setting of hypotension . 3. Leukocytosis now resolved likely stress response no evidence of abscess on CT scan, possible aspiration?? 4. Hypomagnesemia replete 5. Alcohol dependence , actively withdrawing, continue CIWA protocol, requiring a sitter 6. Fever possible aspiration in the setting of hallucinations altered mental status. Blood culture x2, chest x-ray, started Zosyn due to concern about aspiration pneumonia,  7. Sinus tachycardia due to fever , pain , cardiac enzymes? 8. Ileus , trial of suppository, npo until resolves   9. Bilateral pleural effusions, received one dose of Lasix last night IV fluids discontinued, tachypneic , will tx to step down       Code chief insufficiency Status: full  Family Communication: family updated about patient's clinical progress  Disposition Plan: Not stable for discharge , step dpwn transfer   Brief narrative:  56 y.o. female who presents with complaints of epigastric ABD Pain which started in the AM along with nausea and vomiting. She reports not being able to eat or drink due to her pain and nausea. She rated her pain as a 10/10. She developed fevers and chills in the afternoon. She has a history of Alcohol abuse but reports that she has not been able to drink more than a few ounces of alcohol due to her pancreatitis. Her last drink was 2 days ago  Consultants:  none    Procedures:  none  Antibiotics:  none  HPI/Subjective:  Pt very restless and disoriented. Multiple attempts to climb out of bed, stating she is going to the "cook off." Efforts to re-orient pt without success. Pt oriented to self only.  Pt moved to room closest to nurses' station. Safety sitter at bedside. dph     Objective: Filed Vitals:   12/30/11 1400 12/30/11 1843 12/30/11 1900 12/30/11 2100  BP:  129/68 137/78 124/62  Pulse:  115 110 109  Temp:   99.2 F (37.3 C) 98.7 F (37.1 C)  TempSrc:   Oral Oral  Resp:   24 22  Height:      Weight:      SpO2: 90%  91% 94%    Intake/Output Summary (Last 24 hours) at 12/31/11 0056 Last data filed at 12/31/11 0000  Gross per 24 hour  Intake 2374.94 ml  Output   1750 ml  Net 624.94 ml    Exam:  HENT:  Head: Atraumatic.  Nose: Nose normal.  Mouth/Throat: Oropharynx is clear and moist.  Eyes: Conjunctivae are normal. Pupils are equal, round, and reactive to light. No scleral icterus.  Neck: Neck supple. No tracheal deviation present.  Cardiovascular: Normal rate, regular rhythm, normal heart sounds and intact distal pulses.  Pulmonary/Chest: Effort normal and breath sounds normal. No respiratory distress.  Abdominal: Soft. Normal appearance and bowel sounds are normal. She exhibits no distension. There is no tenderness.  Musculoskeletal: She exhibits no edema and no tenderness.  Neurological: She is confused . No cranial nerve deficit.    Data Reviewed: Basic Metabolic Panel:  Lab 12/30/11 9562 12/29/11 0345  12/28/11 0342 12/27/11 0419 12/26/11 2220  NA 133* 132* 134* 135 133*  K 3.9 4.2 5.0 4.2 3.8  CL 105 102 103 101 97  CO2 20 19 20 25 21   GLUCOSE 124* 112* 111* 114* 174*  BUN 37* 34* 25* 13 11  CREATININE 0.98 1.06 1.34* 0.83 0.67  CALCIUM 7.9* 7.9* 8.0* 8.2* 9.1  MG 3.0* -- -- 1.4* --  PHOS -- -- -- -- --    Liver Function Tests:  Lab 12/30/11 0335 12/29/11 0345 12/28/11 0342 12/26/11 2220  AST 18 26 24  29   ALT 11 16 18 26   ALKPHOS 80 80 76 96  BILITOT 0.2* 0.3 0.4 0.7  PROT 5.2* 5.6* 6.0 7.3  ALBUMIN 1.7* 2.1* 2.3* 3.3*    Lab 12/29/11 0345 12/28/11 0342 12/26/11 2220  LIPASE 126* 179* 177*  AMYLASE -- -- --   No results found for this basename: AMMONIA:5 in the last 168 hours  CBC:  Lab 12/29/11 0345 12/28/11 0342 12/27/11 0419 12/26/11 2220  WBC 10.5 6.8 11.0* 15.8*  NEUTROABS -- -- -- --  HGB 11.4* 12.0 11.8* 13.3  HCT 37.1 38.5 37.7 41.2  MCV 90.9 90.4 90.0 88.0  PLT 288 289 255 297    Cardiac Enzymes: No results found for this basename: CKTOTAL:5,CKMB:5,CKMBINDEX:5,TROPONINI:5 in the last 168 hours BNP (last 3 results) No results found for this basename: PROBNP:3 in the last 8760 hours   CBG: No results found for this basename: GLUCAP:5 in the last 168 hours  Recent Results (from the past 240 hour(s))  URINE CULTURE     Status: Normal   Collection Time   12/27/11  9:37 PM      Component Value Range Status Comment   Specimen Description URINE, RANDOM   Final    Special Requests NONE   Final    Culture  Setup Time 12/28/2011 09:31   Final    Colony Count 45,000 COLONIES/ML   Final    Culture     Final    Value: Multiple bacterial morphotypes present, none predominant. Suggest appropriate recollection if clinically indicated.   Report Status 12/29/2011 FINAL   Final      Studies: Ct Abdomen Pelvis Wo Contrast  12/30/2011  *RADIOLOGY REPORT*  Clinical Data: Abdominal pain.  Nausea and vomiting.  Elevated liver function tests.  Follow-up acute pancreatitis.  CT ABDOMEN AND PELVIS WITHOUT CONTRAST  Technique:  Multidetector CT imaging of the abdomen and pelvis was performed following the standard protocol without intravenous contrast.  Comparison: 12/27/2011  Findings: New small bilateral pleural effusions and bibasilar atelectasis or infiltrates are seen.  A moderate size hiatal hernia is again demonstrated.  Moderate ascites within the abdomen and pelvis has  significantly increased since previous study.  New diffuse body wall edema also noted.  A persistent small focal fluid collection is seen between the posterior wall the gastric antrum and the pancreatic neck which measures approximately 2 x 3 cm and is not significantly changed. This is suspicious for a small pancreatic pseudocyst.  Swelling of the pancreatic head and peripancreatic inflammatory changes remain consistent with acute pancreatitis, but show no significant change.  Surgical clips again seen from prior cholecystectomy.  No definite evidence of biliary dilatation. Liver, spleen, adrenal glands, and kidneys are unremarkable in appearance on this noncontrast study.  No soft tissue masses or lymphadenopathy identified.  Sigmoid diverticulosis is again demonstrated, however there is no evidence of diverticulitis.  Foley catheter is now seen within the bladder which  is collapsed.  Increased colonic ileus pattern is demonstrated.  IMPRESSION:  1. No significant change in appearance of the acute pancreatitis involving the pancreatic head and neck with probable 3 cm pseudocyst along the posterior wall of gastric antrum. 2.  Significant increase in moderate ascites and diffuse body wall edema since prior study.  New bilateral pleural effusions and bibasilar atelectasis or infiltrates also noted. 3.  Worsening colonic ileus pattern. 4.  Moderate hiatal hernia.   Original Report Authenticated By: Danae Orleans, M.D.    Dg Chest 2 View  12/30/2011  *RADIOLOGY REPORT*  Clinical Data: Low grade temperature and wheezes.  CHEST - 2 VIEW  Comparison: 03/24/2011  Findings: Two views of the chest demonstrate bibasilar densities suggestive for pleural fluid.  Prominent interstitial markings may represent mild edema.  There are increased densities in the right lung and cannot exclude airspace disease.  Heart size appears to be upper limits of normal.  Negative for a pneumothorax.  IMPRESSION: There is evidence for  bilateral pleural effusions and cannot exclude mild interstitial edema.  Patchy densities in the right lung may represent asymmetric edema versus infection.   Original Report Authenticated By: Richarda Overlie, M.D.    Ct Abdomen Pelvis W Contrast  12/27/2011  *RADIOLOGY REPORT*  Clinical Data: Pain.  Pancreatitis.  White cell count 15.8.  Lipase 177.  Field  CT ABDOMEN AND PELVIS WITH CONTRAST  Technique:  Multidetector CT imaging of the abdomen and pelvis was performed following the standard protocol during bolus administration of intravenous contrast.  Contrast: OMNIPAQUE IOHEXOL 300 MG/ML  SOLN  Comparison: 09/27/2011.  Findings: Atelectasis or infiltration in both lung bases.  Large esophageal hiatal hernia.  There is extensive inflammatory infiltration and edema in the upper abdominal fat surrounding the pancreas and extending into the mesenteric and omental regions.  Changes are consistent with progressing acute pancreatitis.  Peripancreatic fluid collections are again demonstrated consistent with pseudocyst, similar to previous study.  Mild pancreatic ductal dilatation.  There appears to be homogeneous enhancement of the pancreatic parenchyma without evidence of pancreatic necrosis.  No focal abscess.  Mild intra and extrahepatic bile duct dilatation. Portal and mesenteric vessels appear patent.  Surgical absence of the gallbladder.  The liver, spleen, adrenal glands, kidneys, and retroperitoneal lymph nodes are unremarkable.  Calcification of the abdominal aorta without aneurysm.  The stomach, small bowel, and colon are not distended.  No free air in the abdomen.  Pelvis:  Small amount of free fluid in the pelvis, likely related to inflammatory process.  No loculated fluid collections.  Bladder is decompressed.  The uterus and adnexal structures are not enlarged.  The appendix is normal.  No diverticulitis.  No significant lymphadenopathy in the pelvis.  IMPRESSION: Significant increase in upper abdominal  inflammatory changes and edema consistent with progression of acute pancreatitis.  Fluid collections consistent with pseudocyst appears stable.   Original Report Authenticated By: Marlon Pel, M.D.    Dg Abd Acute W/chest  12/26/2011  *RADIOLOGY REPORT*  Clinical Data: Epigastric pain and nausea.  ACUTE ABDOMEN SERIES (ABDOMEN 2 VIEW & CHEST 1 VIEW)  Comparison: CT abdomen and pelvis 09/27/2011.  Findings: Shallow inspiration with atelectasis or infiltration in the lung bases.  Normal heart size and pulmonary vascularity.  No blunting of costophrenic angles.  No pneumothorax.  Mediastinal contours are intact.  Calcification of the aorta.  Esophageal hiatal hernia behind the heart.  Scattered gas and stool in the colon.  No small or large bowel  distension.  No abnormal air fluid levels.  No free air in the abdomen.  Surgical clips in the right upper quadrant.  Degenerative changes in the spine and hips.  IMPRESSION: Shallow inspiration with infiltration or atelectasis in the lung bases.  Esophageal hiatal hernia.  Nonobstructing bowel gas pattern.   Original Report Authenticated By: Marlon Pel, M.D.     Scheduled Meds:   . antiseptic oral rinse  15 mL Mouth Rinse BID  . bisacodyl  10 mg Rectal Once  . enoxaparin (LOVENOX) injection  40 mg Subcutaneous Q24H  . folic acid  1 mg Oral Daily  . LORazepam  0-4 mg Intravenous Q6H   Followed by  . LORazepam  0-4 mg Intravenous Q12H  . multivitamin with minerals  1 tablet Oral Daily  . oxyCODONE  10 mg Oral Q12H  . piperacillin-tazobactam (ZOSYN)  IV  3.375 g Intravenous Q8H  . thiamine  100 mg Oral Daily   Or  . thiamine  100 mg Intravenous Daily  . DISCONTD: oxyCODONE  15 mg Oral Q12H   Continuous Infusions:   . sodium chloride 75 mL/hr at 12/30/11 1833    Principal Problem:  *Acute pancreatitis Active Problems:  Alcohol abuse  Epigastric abdominal pain  Leukocytosis  Hyponatremia    Time spent: 40  minutes   [facility]  [facility] Pager [phone]. If 8PM-8AM, please contact night-coverage at [email or web address], password [email or web address] 12/31/2011, 12:56 AM  LOS: 5 days

## 2011-12-31 NOTE — Consult Note (Signed)
Name: [name] MRN: [id number] DOB: 1956/03/21    LOS: 5  Referring Provider:  [name] Reason for Referral:  Acute Respiratory Failure  PULMONARY / CRITICAL CARE MEDICINE  HPI:  [name] is a 56 y.o. female who presented to [facility] ER on 12/26/11 with complaints of epigastric pain (10/10), fever, chills, nausea, and vomiting. The patient has a history of ETOH abuse, but reports that she has not been able to drink more than a few ounces of alcohol due to her pancreatitis. Her last drink was on 09/05. MRCP was done 10/06/2011 which showed dilated common bile duct with biliary sludge. She subsequently underwent an ERCP with sphincterotomy on 10/06/2011.   According to the medical record she was confused at the time of admission.  [facility] admitted the patient, she was fluid resuscitated and placed on prophylactic abx, alcohol withdrawal protocol, and pain management.  Initially the patient's lipase improved from 177 to 126, and her LFTs remained normal.  On 9/12 an RRT was called for respiratory decline and lethargy.  The patient was given 40 mg of lasix IV and her oxygen increased to 50% via venti mask.  She was transferred to ICU for closer monitoring.   Past Medical History  Diagnosis Date  . GERD (gastroesophageal reflux disease)   . Rosacea   . Elevated liver function tests   . Wears glasses   . Chronic diarrhea   . Alcohol abuse     H/o withdrawal   . PONV (postoperative nausea and vomiting)   . Pancreatitis    Past Surgical History  Procedure Date  . Cholecystectomy   . Shoulder surgery [id number]  . Diagnostic mammogram 2008  . Tubal ligation   . Colonoscopy Never  . Esophagogastroduodenoscopy 03/21/2011    Procedure: ESOPHAGOGASTRODUODENOSCOPY (EGD);  Surgeon: Yancey Flemings, MD;  Location: [name] ENDOSCOPY;  Service: Endoscopy;  Laterality: N/A;  Patient may need to be done at bedside tomorrow depending on status  . Ercp 10/06/2011    Procedure: ENDOSCOPIC RETROGRADE  CHOLANGIOPANCREATOGRAPHY (ERCP);  Surgeon: Theda Belfast, MD;  Location: [facility] ENDOSCOPY;  Service: Endoscopy;  Laterality: N/A;   Prior to Admission medications   Medication Sig Start Date End Date Taking? Authorizing Provider  LORazepam (ATIVAN) 1 MG tablet Take 1 mg by mouth 2 (two) times daily as needed. Anxiety   Yes Historical Provider, MD  metoCLOPramide (REGLAN) 10 MG tablet Take 10 mg by mouth daily as needed. For nausea   Yes Historical Provider, MD  oxyCODONE (OXY IR/ROXICODONE) 5 MG immediate release tablet Take 5 mg by mouth every 4 (four) hours as needed.   Yes Historical Provider, MD   Allergies Allergies  Allergen Reactions  . Ciprofloxacin Nausea Only    REACTION: nausea    Family History Family History  Problem Relation Age of Onset  . Stroke Mother   . Heart disease Father   . Heart failure Father   . Heart disease Sister    Social History  reports that she quit smoking about 33 years ago. She has never used smokeless tobacco. She reports that she drinks about 1.5 - 2 ounces of alcohol per week. She reports that she does not use illicit drugs.  Review Of Systems:  Unable to perform, confused and lethargic at time of exam  Brief patient description:  A 56 yo patient with history of ETOH abuse who was admitted on 9/7 w/pancreatitis/sepsis. Moved to the ICU on 9/12 w/ persistent SIRS, increased respiratory distress,  and  probable ETOH  withdrawal.   Events Since Admission: 9/12 RRT and subsequent transfer to the ICU.  Current Status: Critical  Vital Signs: Temp:  [98.5 F (36.9 C)-101 F (38.3 C)] 100.1 F (37.8 C) (09/12 0936) Pulse Rate:  [102-115] 103  (09/12 1002) Resp:  [22-24] 22  (09/12 1002) BP: (106-138)/(59-84) 130/69 mmHg (09/12 1002) SpO2:  [86 %-94 %] 93 % (09/12 1002) FiO2 (%):  [50 %] 50 % (09/12 1002)  Physical Examination: General: Sickly appearance, confused and restless with any stimulation HEENT: Supple neck, no JVD, no tracheal  deviation present.  Head: Normocephalic Mouth/Throat: Oropharynx is clear but dry Eyes: Conjunctivae are normal. Pupils are equal, round, and reactive to light. No scleral icterus.   Cardiovascular: Tachycardic, regular rhythm, normal heart sounds, S1 S2 and intact distal pulses.  Pulmonary/Chest: Tachypnea noted, lungs clear with some expiratory wheezing.  Abdominal: Soft but rounded and distended.  Tender to deep palpation. No guarding. Hyperactive bowel sounds. Musculoskeletal: She exhibits no edema and no tenderness.  Neurological: She is oriented x2, restless.  Moves all ext.   Principal Problem:  *Acute pancreatitis Active Problems:  Alcohol abuse  Epigastric abdominal pain  Leukocytosis  Hyponatremia   ASSESSMENT AND PLAN  PULMONARY  Lab 12/31/11 0442  PHART 7.403  PCO2ART 33.4*  PO2ART 59.0*  HCO3 20.1  O2SAT 88.3   O2: 50% via ventiri mask CXR: Pulmonary vascular congestion and edema with small bilateral pleural effusions and basilar atelectasis filtration.  A: 1)  Acute Hypoxic respiratory failure w/ respiratory alkalosis,  in setting of bilateral pulmonary infiltrates. Not sure if this is ALI or edema. There are small bilateral effusions. Not able to protect airway 2)  Probable PNA HCAP vs Aspiration.       P:   Bipap for now and plan on intubation  Ventilate on ARDS protocol  See infection section Need CVP  F/u cxr  CARDIOVASCULAR  Lab 12/31/11 0650  TROPONINI <0.30  LATICACIDVEN --  PROBNP --   ECG:   Sinus Tachycardia at 105 Lines:  Peripherals.  A: SIRS vs prob sepsis      Tachycardia In setting of pancreatitis, may be exacerbated by new pulmonary infection P: Continuous telemetry Cycling troponins  RENAL  Lab 12/31/11 0338 12/30/11 0335 12/29/11 0345 12/28/11 0342 12/27/11 0419  NA 136 133* 132* 134* 135  K 3.9 3.9 -- -- --  CL 107 105 102 103 101  CO2 21 20 19 20 25   BUN 25* 37* 34* 25* 13  CREATININE 0.63 0.98 1.06 1.34* 0.83    CALCIUM 7.8* 7.9* 7.9* 8.0* 8.2*  MG -- 3.0* -- -- 1.4*  PHOS -- -- -- -- --   Intake/Output      09/11 0701 - 09/12 0700 09/12 0701 - 09/13 0700   P.O.     I.V. (mL/kg) 2404.9 (35)    Other     IV Piggyback 100    Total Intake(mL/kg) 2504.9 (36.4)    Urine (mL/kg/hr) 2600 (1.6) 300 (0.9)   Stool 0 3   Total Output 2600 303   Net -95.1 -303        Urine Occurrence 1 x    Stool Occurrence 3 x     Foley:  intact  A: Acute renal insufficiency - likely from previous episodes of hypotension which has now resolved.     Hyponatremia: better   P:  Strict I&O      Recheck CMP and mag      Will hold on further  diuresis given risk for hypovolemia        GASTROINTESTINAL  Lab 12/31/11 0338 12/30/11 0335 12/29/11 0345 12/28/11 0342 12/26/11 2220  AST 22 18 26 24 29   ALT 9 11 16 18 26   ALKPHOS 103 80 80 76 96  BILITOT 0.3 0.2* 0.3 0.4 0.7  PROT 5.3* 5.2* 5.6* 6.0 7.3  ALBUMIN 1.6* 1.7* 2.1* 2.3* 3.3*   Lipase     Component Value Date/Time   LIPASE 126* 12/29/2011 0345   A:  Acute Pancreatitis - lipase levels were improved, liver function test normal P:  Recheck CMP, lipase, and amylase       NPO       Pepcid ordered for GI px       Will eventually need re-imaging of abd/pelvis    HEMATOLOGIC  Lab 12/31/11 0338 12/29/11 0345 12/28/11 0342 12/27/11 0419 12/26/11 2220  HGB 9.8* 11.4* 12.0 11.8* 13.3  HCT 30.2* 37.1 38.5 37.7 41.2  PLT 239 288 289 255 297  INR -- -- -- -- --  APTT -- -- -- -- --   A:  Mild Anemia - likely hemodilution from fluid administration P:  Recheck CBC      Transfuse for HGB less than 7   INFECTIOUS  Lab 12/31/11 0338 12/29/11 0345 12/28/11 0342 12/27/11 0419 12/26/11 2220  WBC 13.1* 10.5 6.8 11.0* 15.8*  PROCALCITON -- -- -- -- --   Cultures: Blood - 9/11>>> Urine - 9/8 - Multiple bacterial morphotypes  Antibiotics: Zosyn 9/11>>> Vanc 9/12>>>  A: SIRS vs sepsis     UTI - positive for leuks, and nitrites, cultures likely  contaminated     Probable PNA  P: CVC placement for drug administration and monitoring of fluid balance.      Follow WBC      Procalcitonin ordered      Urine Culture      Continue empiric ABX r/t possible PNA   ENDOCRINE CBG (last 3)  No results found for this basename: GLUCAP:3 in the last 72 hours A: No issues currently   P: CBG Q4H r/t high risk for hyperglycemia  NEUROLOGIC A: Alcohol withdrawal/ acute encephalopathy      Pain r/t acute pancreatitis     Chronic narcotic denpendence  P: fentanyl     CIWA protocol ordered Q4H    Ativan as indicated via CIWA, w/ consideration for precedex at some point if needed.     Sitter at bedside  BEST PRACTICE / DISPOSITION Level of Care:  ICU Primary Service:  [facility]: CCM Code Status:  Full Diet:  NPO DVT Px: Lovenox, SCDs GI Px:  Pepcid Skin Integrity:  Inact Social / Family:  Windell Moulding, NP Student Pulmonary and Critical Care Medicine [facility] Pager: [phone]  12/31/2011, 11:43 AM  Will place in ICU, full mechanical vent support, f/u ABG, vanc/zosyn, f/u on cultures, IVF and ARDS protocol.  CC time 90 min.  Patient seen and examined, agree with above note.  I dictated the care and orders written for this patient under my direction.  Koren Bound, M.D. [phone]

## 2011-12-31 NOTE — Progress Notes (Signed)
ANTIBIOTIC CONSULT NOTE - INITIAL  Pharmacy Consult for vancomycin (added to Zosyn today) Indication: pneumonia  Allergies  Allergen Reactions  . Ciprofloxacin Nausea Only    REACTION: nausea    Patient Measurements: Height: 5\' 6"  (167.6 cm) Weight: 151 lb 10.8 oz (68.8 kg) IBW/kg (Calculated) : 59.3   Vital Signs: Temp: 100.1 F (37.8 C) (09/12 0936) Temp src: Axillary (09/12 0936) BP: 115/68 mmHg (09/12 0936) Pulse Rate: 109  (09/12 0936) Intake/Output from previous day: 09/11 0701 - 09/12 0700 In: 2504.9 [I.V.:2404.9; IV Piggyback:100] Out: 2600 [Urine:2600] Intake/Output from this shift: Total I/O In: -  Out: 1 [Stool:1]  Labs:  [facility] 12/31/11 0338 12/30/11 0335 12/29/11 0345  WBC 13.1* -- 10.5  HGB 9.8* -- 11.4*  PLT 239 -- 288  LABCREA -- -- --  CREATININE 0.63 0.98 1.06   Estimated Creatinine Clearance: 73.5 ml/min (by C-G formula based on Cr of 0.63). No results found for this basename: VANCOTROUGH:2,VANCOPEAK:2,VANCORANDOM:2,GENTTROUGH:2,GENTPEAK:2,GENTRANDOM:2,TOBRATROUGH:2,TOBRAPEAK:2,TOBRARND:2,AMIKACINPEAK:2,AMIKACINTROU:2,AMIKACIN:2, in the last 72 hours   Microbiology: Recent Results (from the past 720 hour(s))  URINE CULTURE     Status: Normal   Collection Time   12/27/11  9:37 PM      Component Value Range Status Comment   Specimen Description URINE, RANDOM   Final    Special Requests NONE   Final    Culture  Setup Time 12/28/2011 09:31   Final    Colony Count 45,000 COLONIES/ML   Final    Culture     Final    Value: Multiple bacterial morphotypes present, none predominant. Suggest appropriate recollection if clinically indicated.   Report Status 12/29/2011 FINAL   Final   CULTURE, BLOOD (ROUTINE X 2)     Status: Normal (Preliminary result)   Collection Time   12/30/11  9:50 AM      Component Value Range Status Comment   Specimen Description BLOOD RIGHT ARM   Final    Special Requests BOTTLES DRAWN AEROBIC AND ANAEROBIC 5CC   Final    Culture  Setup Time 12/30/2011 14:02   Final    Culture     Final    Value:        BLOOD CULTURE RECEIVED NO GROWTH TO DATE CULTURE WILL BE HELD FOR 5 DAYS BEFORE ISSUING A FINAL NEGATIVE REPORT   Report Status PENDING   Incomplete   CULTURE, BLOOD (ROUTINE X 2)     Status: Normal (Preliminary result)   Collection Time   12/30/11 10:00 AM      Component Value Range Status Comment   Specimen Description BLOOD LEFT ARM   Final    Special Requests BOTTLES DRAWN AEROBIC AND ANAEROBIC 5CC   Final    Culture  Setup Time 12/30/2011 14:02   Final    Culture     Final    Value:        BLOOD CULTURE RECEIVED NO GROWTH TO DATE CULTURE WILL BE HELD FOR 5 DAYS BEFORE ISSUING A FINAL NEGATIVE REPORT   Report Status PENDING   Incomplete     Medical History: Past Medical History  Diagnosis Date  . GERD (gastroesophageal reflux disease)   . Rosacea   . Elevated liver function tests   . Wears glasses   . Chronic diarrhea   . Alcohol abuse     H/o withdrawal   . PONV (postoperative nausea and vomiting)   . Pancreatitis     Medications:  Scheduled:    . antiseptic oral rinse  15 mL Mouth  Rinse BID  . bisacodyl  10 mg Rectal Once  . enoxaparin (LOVENOX) injection  40 mg Subcutaneous Q24H  . folic acid  1 mg Oral Daily  . furosemide  40 mg Intravenous Once  . furosemide  40 mg Intravenous Once  . LORazepam  0-4 mg Intravenous Q6H   Followed by  . LORazepam  0-4 mg Intravenous Q12H  . multivitamin with minerals  1 tablet Oral Daily  . oxyCODONE  10 mg Oral Q12H  . piperacillin-tazobactam (ZOSYN)  IV  3.375 g Intravenous Q8H  . thiamine  100 mg Oral Daily   Or  . thiamine  100 mg Intravenous Daily   Infusions:    . sodium chloride 20 mL/hr at 12/31/11 0508   Assessment: 56 yo female to add vancomycin to Zosyn for fever, increased WBC, and suspected PNA  Goal of Therapy:  Vancomycin trough level 15-20 mcg/ml  Plan:  1) Vancomycin 750mg  IV q12 based on current weight and renal  function 2) check vanc trough at steady state   Hessie Knows, PharmD, BCPS Pager [phone] 12/31/2011 9:57 AM

## 2011-12-31 NOTE — Progress Notes (Signed)
Noted patient with increased labor of breathing and shallow breathes while on 6 Liters of Oxygen (nasal cannula). Vitals taken and Rapid Response Nurse called to assess patient. Follow up call to NP Schorr in to see patient and to advise.  Respiratory placed patient on 50% NRE, Chest Xray completed and IV lasix given per doctor order. See rapid response nurse follow up and plan. Will continue to monitor patient per Doctor orders  and unit protocol.

## 2011-12-31 NOTE — Procedures (Signed)
Central Venous Catheter Insertion Procedure Note [name] [id number] 1956/02/21  Procedure: Insertion of Central Venous Catheter Indications: Drug and/or fluid administration  Procedure Details Consent: Unable to obtain consent because of emergent medical necessity. Time Out: Verified patient identification, verified procedure, site/side was marked, verified correct patient position, special equipment/implants available, medications/allergies/relevent history reviewed, required imaging and test results available.  Performed  Maximum sterile technique was used including antiseptics, cap, gloves, gown, hand hygiene, mask and sheet. Skin prep: Chlorhexidine; local anesthetic administered A antimicrobial bonded/coated triple lumen catheter was placed in the right internal jugular vein using the Seldinger technique.  Evaluation Blood flow good Complications: No apparent complications Patient did tolerate procedure well. Chest X-ray ordered to verify placement.  CXR: pending.  U/S used in placement, picture in chart.  YACOUB,WESAM 12/31/2011, 1:50 PM

## 2011-12-31 NOTE — Progress Notes (Signed)
Pt  Transferred to [id number] for close monitoring, report given via telephone,personally transferred pt to above unit.

## 2011-12-31 NOTE — Procedures (Signed)
Arterial Catheter Insertion Procedure Note ANNECY STEFF [id number] 1956/03/16  Procedure: Insertion of Arterial Catheter  Indications: Blood pressure monitoring  Procedure Details Consent: Unable to obtain consent because of altered level of consciousness. Time Out: Verified patient identification, verified procedure, site/side was marked, verified correct patient position, special equipment/implants available, medications/allergies/relevent history reviewed, required imaging and test results available.  Performed  Maximum sterile technique was used including antiseptics, cap, gloves, gown, hand hygiene, mask and sheet. Skin prep: Chlorhexidine; local anesthetic administered 20 gauge catheter was inserted into right radial artery using the Seldinger technique.  Evaluation Blood flow good; BP tracing good. Complications: No apparent complications.   Koren Bound 12/31/2011

## 2011-12-31 NOTE — Significant Event (Addendum)
Rapid Response Event Note  Overview: Time Called: 0340 Arrival Time: 0345 Event Type: Respiratory;Unknown  Initial Focused Assessment: Called for discussion of patients increased O2 demand and labored respiratory pattern. Pt lethargic. Reviewed lab and Nurse Practitioner on the floor came to evaluate. Sats low with 6L n.c.   Interventions: changed to 50% VM and CXR done. Lasix to be given and IV fluids decreased   Event Summary:Pt stayed in room and event ended at 0500   at      at          Kevan Ny B

## 2011-12-31 NOTE — Progress Notes (Signed)
Event: Notified by RN that pt w/ increased respirations and slight decrease in 02 sats into high 80's from low 90's. Also reported pt appears somewhat more lethargic though has been lethargic and confused at baseline since admission. NP to bedside. Subjective: Pt unable to provide information due to lethargy and confusion. Objective: At bedside pt noted to be somewhat lethargic and confused (which is not new) will open eyes to voice and follow simple commands. Mildly tachypneac. RR- 24-26 w/ 02 sats 88-91% on 6L via [place]. BBS diminished bil w/ crackles at bases. Pt placed on V-Mask at 50% and 02 sats improved to 93-95%. ABG-  PH7.4, pCO2-33.4,p02-59.0, bicarb-20.1. PCXR reveals Increasing pulmonary vascular congestion and edema with small  bilateral pleural effusions and basilar atelectasis filtration. Worsened since CXR from 12/30/11 AM.  Assessment/Plan: 1.Respiratory distress w/ hypoxia: PCXR findings c/w pulmonary edema (w/ small bil pleural effusions). IVF's decreased to KVO, Lasix 40 mg given IVP. Will continue to monitor closely.

## 2012-01-01 ENCOUNTER — Inpatient Hospital Stay (HOSPITAL_COMMUNITY): Payer: MEDICAID

## 2012-01-01 LAB — COMPREHENSIVE METABOLIC PANEL
ALT: 9 U/L (ref 0–35)
Alkaline Phosphatase: 84 U/L (ref 39–117)
CO2: 20 mEq/L (ref 19–32)
GFR calc Af Amer: >90 mL/min (ref >90)
GFR calc non Af Amer: >90 mL/min (ref >90)
Glucose, Bld: 106 mg/dL — ABNORMAL HIGH (ref 70–99)
Potassium: 3.9 mEq/L (ref 3.5–5.1)
Sodium: 137 mEq/L (ref 135–145)
Total Protein: 5.4 g/dL — ABNORMAL LOW (ref 6.0–8.3)

## 2012-01-01 LAB — GLUCOSE, CAPILLARY
Glucose-Capillary: 102 mg/dL — ABNORMAL HIGH (ref 70–99)
Glucose-Capillary: 99 mg/dL (ref 70–99)
Glucose-Capillary: 117 mg/dL — ABNORMAL HIGH (ref 70–99)

## 2012-01-01 LAB — CBC
HCT: 28.9 % — ABNORMAL LOW (ref 36.0–46.0)
MCHC: 32.5 g/dL (ref 30.0–36.0)
RDW: 16.2 % — ABNORMAL HIGH (ref 11.5–15.5)

## 2012-01-01 LAB — BLOOD GAS, ARTERIAL
Drawn by: 232811
MECHVT: 360 mL
PEEP: 12 cmH2O
TCO2: 18.6 mmol/L (ref 0–100)
pCO2 arterial: 33.3 mmHg — ABNORMAL LOW (ref 35.0–45.0)
pH, Arterial: 7.402 (ref 7.350–7.450)

## 2012-01-01 LAB — PHOSPHORUS: Phosphorus: 3.2 mg/dL (ref 2.3–4.6)

## 2012-01-01 MED ORDER — OXEPA PO LIQD
1000.0000 mL | ORAL | Status: DC
  Administered 2012-01-01: 1000 mL
  Filled 2012-01-01 (×2): qty 1000

## 2012-01-01 MED ORDER — PRO-STAT SUGAR FREE PO LIQD
30.0000 mL | Freq: Every day | ORAL | Status: DC
  Administered 2012-01-01: 30 mL
  Filled 2012-01-01 (×2): qty 30

## 2012-01-01 NOTE — Progress Notes (Signed)
Patient's R radial A-line not working properly.  RT unable to draw blood back, readings are inaccurate from cuff pressures.  Patient's BP (cuff pressure) has been stable this shift.  ELink MD notified.  Will continue to monitor.

## 2012-01-01 NOTE — Consult Note (Signed)
Name: [name] MRN: [id number] DOB: January 16, 1956    LOS: 6  Referring Provider:  [name] Reason for Referral:  Acute Respiratory Failure  PULMONARY / CRITICAL CARE MEDICINE  HPI:  [name] is a 56 y.o. female who presented to [facility] ER on 12/26/11 with complaints of epigastric pain (10/10), fever, chills, nausea, and vomiting. The patient has a history of ETOH abuse, but reports that she has not been able to drink more than a few ounces of alcohol due to her pancreatitis. Her last drink was on 09/05. MRCP was done 10/06/2011 which showed dilated common bile duct with biliary sludge. She subsequently underwent an ERCP with sphincterotomy on 10/06/2011.   According to the medical record she was confused at the time of admission.  [facility] admitted the patient, she was fluid resuscitated and placed on prophylactic abx, alcohol withdrawal protocol, and pain management.  Initially the patient's lipase improved from 177 to 126, and her LFTs remained normal.  On 9/12 an RRT was called for respiratory decline and lethargy.  The patient was given 40 mg of lasix IV and her oxygen increased to 50% via venti mask.  She was transferred to ICU for closer monitoring.   Past Medical History  Diagnosis Date  . GERD (gastroesophageal reflux disease)   . Rosacea   . Elevated liver function tests   . Wears glasses   . Chronic diarrhea   . Alcohol abuse     H/o withdrawal   . PONV (postoperative nausea and vomiting)   . Pancreatitis    Past Surgical History  Procedure Date  . Cholecystectomy   . Shoulder surgery [id number]  . Diagnostic mammogram 2008  . Tubal ligation   . Colonoscopy Never  . Esophagogastroduodenoscopy 03/21/2011    Procedure: ESOPHAGOGASTRODUODENOSCOPY (EGD);  Surgeon: Yancey Flemings, MD;  [facility] ENDOSCOPY;  Service: Endoscopy;  Laterality: N/A;  Patient may need to be done at bedside tomorrow depending on status  . Ercp 10/06/2011    Procedure: ENDOSCOPIC RETROGRADE  CHOLANGIOPANCREATOGRAPHY (ERCP);  Surgeon: Theda Belfast, MD;  Location: [facility] ENDOSCOPY;  Service: Endoscopy;  Laterality: N/A;   Prior to Admission medications   Medication Sig Start Date End Date Taking? Authorizing Provider  LORazepam (ATIVAN) 1 MG tablet Take 1 mg by mouth 2 (two) times daily as needed. Anxiety   Yes Historical Provider, MD  metoCLOPramide (REGLAN) 10 MG tablet Take 10 mg by mouth daily as needed. For nausea   Yes Historical Provider, MD  oxyCODONE (OXY IR/ROXICODONE) 5 MG immediate release tablet Take 5 mg by mouth every 4 (four) hours as needed.   Yes Historical Provider, MD   Allergies Allergies  Allergen Reactions  . Ciprofloxacin Nausea Only    REACTION: nausea    Family History Family History  Problem Relation Age of Onset  . Stroke Mother   . Heart disease Father   . Heart failure Father   . Heart disease Sister    Social History  reports that she quit smoking about 33 years ago. She has never used smokeless tobacco. She reports that she drinks about 1.5 - 2 ounces of alcohol per week. She reports that she does not use illicit drugs.  Review Of Systems:  Unable to perform, confused and lethargic at time of exam  Brief patient description:  A 56 yo patient with history of ETOH abuse who was admitted on 9/7 w/pancreatitis/sepsis. Moved to the ICU on 9/12 w/ persistent SIRS, increased respiratory distress,  and  probable ETOH  withdrawal.   Events Since Admission: 9/12 RRT and subsequent transfer to the ICU.  Current Status: Critical  Vital Signs: Temp:  [98.5 F (36.9 C)-100.9 F (38.3 C)] 100.5 F (38.1 C) (09/13 0400) Pulse Rate:  [92-118] 92  (09/13 0800) Resp:  [16-34] 21  (09/13 0800) BP: (106-164)/(61-90) 110/68 mmHg (09/13 0800) SpO2:  [90 %-100 %] 97 % (09/13 0800) Arterial Line BP: (84-134)/(54-92) 84/68 mmHg (09/13 0800) FiO2 (%):  [50 %-100 %] 60 % (09/13 0821) Weight:  [78.6 kg (173 lb 4.5 oz)] 78.6 kg (173 lb 4.5 oz) (09/13  0400)  Physical Examination: General: Sickly appearance, sedated and intubated. HEENT: Supple neck, no JVD, no tracheal deviation present.  Head: Normocephalic Mouth/Throat: Intubated Eyes: Conjunctivae are normal. Pupils are equal, round, and reactive to light. No scleral icterus.   Cardiovascular: Tachycardic, regular rhythm, normal heart sounds, S1 S2 and intact distal pulses.  Pulmonary/Chest: Coarse BS diffusely.  Abdominal: Soft but rounded and distended.  Tender to deep palpation. No guarding. Hyperactive bowel sounds. Musculoskeletal: She exhibits no edema and no tenderness.  Neurological: Sedated but moves all ext to command.   Principal Problem:  *Acute pancreatitis Active Problems:  Alcohol abuse  Epigastric abdominal pain  Leukocytosis  Hyponatremia  Acute respiratory failure  Shock  ARDS (adult respiratory distress syndrome)   ASSESSMENT AND PLAN  PULMONARY  Lab 01/01/12 0350 12/31/11 1540 12/31/11 1210 12/31/11 0442  PHART 7.402 7.440 7.494* 7.403  PCO2ART 33.3* 28.6* 28.6* 33.4*  PO2ART 108.0* 81.4 55.7* 59.0*  HCO3 20.0 19.1* 21.8 20.1  O2SAT 97.6 95.9 91.2 88.3   O2: 50% via ventiri mask CXR: Pulmonary vascular congestion and edema with small bilateral pleural effusions and basilar atelectasis filtration. ETT 9/12>>>  A: 1)  Acute Hypoxic respiratory failure w/ respiratory alkalosis,  in setting of bilateral pulmonary infiltrates. Not sure if this is ALI or edema. There are small bilateral effusions. Not able to protect airway 2)  Probable PNA HCAP vs Aspiration.       P:   Continue full vent support for now. Ventilate on ARDS protocol. See ID section. Monitor CVP and keep on the dry side. F/u cxr and ABG in AM. Will need diureses when BP is more stable.  CARDIOVASCULAR  Lab 12/31/11 1220 12/31/11 0650  TROPONINI <0.30 <0.30  LATICACIDVEN 1.4 --  PROBNP -- --   ECG:   Sinus Tachycardia at 105 Lines:  Peripherals.  A: SIRS vs prob  sepsis      Tachycardia In setting of pancreatitis, may be exacerbated by new pulmonary infection P: Continuous telemetry  RENAL  Lab 01/01/12 0356 12/31/11 2240 12/31/11 1220 12/31/11 0338 12/30/11 0335 12/29/11 0345 12/27/11 0419  NA 137 -- 135 136 133* 132* --  K 3.9 3.8 -- -- -- -- --  CL 106 -- 101 107 105 102 --  CO2 20 -- 23 21 20 19  --  BUN 28* -- 23 25* 37* 34* --  CREATININE 0.68 -- 0.69 0.63 0.98 1.06 --  CALCIUM 7.7* -- 7.9* 7.8* 7.9* 7.9* --  MG 2.4 -- 2.1 -- 3.0* -- 1.4*  PHOS 3.2 -- -- -- -- -- --   Intake/Output      09/12 0701 - 09/13 0700 09/13 0701 - 09/14 0700   I.V. (mL/kg) 408 (5.2) 104 (1.3)   IV Piggyback 675    Total Intake(mL/kg) 1083 (13.8) 104 (1.3)   Urine (mL/kg/hr) 1035 (0.5) 60   Stool 3    Total Output 1038 60  Net +45 +44         Foley:  intact  A: Acute renal insufficiency - likely from previous episodes of hypotension which has now resolved.     Hyponatremia: better   P:  Strict I&O.      BMET in AM      Will hold on further diuresis and keep fluid even.      Replace electrolytes as needed.       GASTROINTESTINAL  Lab 01/01/12 0356 12/31/11 1220 12/31/11 0338 12/30/11 0335 12/29/11 0345  AST 24 18 22 18 26   ALT 9 9 9 11 16   ALKPHOS 84 77 103 80 80  BILITOT 0.4 0.4 0.3 0.2* 0.3  PROT 5.4* 5.4* 5.3* 5.2* 5.6*  ALBUMIN 1.6* 1.6* 1.6* 1.7* 2.1*   Lipase     Component Value Date/Time   LIPASE 58 12/31/2011 1220   A:  Acute Pancreatitis - lipase levels were improved, liver function test normal P:  Recheck CMP, lipase, and amylase       Start TF.       Pepcid ordered for GI px       Will eventually need re-imaging of abd/pelvis when more stable.  HEMATOLOGIC  Lab 01/01/12 0356 12/31/11 1220 12/31/11 0338 12/29/11 0345 12/28/11 0342  HGB 9.4* 10.0* 9.8* 11.4* 12.0  HCT 28.9* 29.7* 30.2* 37.1 38.5  PLT 299 253 239 288 289  INR -- -- -- -- --  APTT -- -- -- -- --   A:  Mild Anemia - likely hemodilution from fluid  administration P:  Recheck CBC      Transfuse for HGB less than 7   INFECTIOUS  Lab 01/01/12 0356 12/31/11 1220 12/31/11 0338 12/29/11 0345 12/28/11 0342  WBC 20.4* 15.0* 13.1* 10.5 6.8  PROCALCITON -- 2.93 -- -- --   Cultures: Blood - 9/11>>> Urine - 9/8 - Multiple bacterial morphotypes  Antibiotics: Zosyn 9/11>>> Vanc 9/12>>>  A: SIRS vs sepsis     UTI - positive for leuks, and nitrites, cultures likely contaminated     Probable PNA  P:  Follow WBC      Urine Culture      Continue empiric ABX r/t possible PNA  ENDOCRINE CBG (last 3)   Basename 01/01/12 0400 12/31/11 2343 12/31/11 2108  GLUCAP 102* 100* 97   A: No issues currently   P: CBG Q4H r/t high risk for hyperglycemia  NEUROLOGIC A: Alcohol withdrawal/ acute encephalopathy      Pain r/t acute pancreatitis     Chronic narcotic denpendence  P: Fentanyl.     CIWA protocol ordered Q4H.     Versed and fentanyl drips.   BEST PRACTICE / DISPOSITION Level of Care:  ICU Primary Service:  [facility] Consultants: CCM Code Status:  Full Diet:  NPO DVT Px: Lovenox, SCDs GI Px:  Pepcid Skin Integrity:  Inact Social / Family:  Unavalible  CC time 35 min.  Alyson Reedy, M.D. [facility] Pulmonary/Critical Care Medicine. Pager: [phone]. After hours pager: [phone].

## 2012-01-01 NOTE — Progress Notes (Signed)
INITIAL ADULT NUTRITION ASSESSMENT Date: 01/01/2012   Time: 11:54 AM Reason for Assessment: Consult for tube feeding initiation and management   ASSESSMENT: Female 56 y.o.  Dx: Acute pancreatitis  INTERVENTION: 1. Initiate TF Oxepa @ 20 ml/hr and increase by 10 ml every 4 hours to a goal rate of 45 ml/hr plus 1 packet prostat daily. Total enteral nutrition to provide 1720 kcal (meets 98% of estimated energy needs), 83 grams protein (meets 97% of estimated energy needs), and 853 ml free water.  2. RD to follow for nutrition plan of care.    Hx:  Past Medical History  Diagnosis Date  . GERD (gastroesophageal reflux disease)   . Rosacea   . Elevated liver function tests   . Wears glasses   . Chronic diarrhea   . Alcohol abuse     H/o withdrawal   . PONV (postoperative nausea and vomiting)   . Pancreatitis     Related Meds:  Scheduled Meds:   . antiseptic oral rinse  15 mL Mouth Rinse QID  . chlorhexidine  15 mL Mouth Rinse BID  . enoxaparin (LOVENOX) injection  40 mg Subcutaneous Q24H  . etomidate      . famotidine (PEPCID) IV  20 mg Intravenous Q1200  . fentaNYL      . folic acid (FOLVITE) IVPB  1 mg Intravenous Once  . folic acid  1 mg Intravenous Daily  . insulin aspart  2-6 Units Subcutaneous Q4H  . lidocaine (cardiac) 100 mg/65ml      . midazolam      . midazolam  1 mg Intravenous Once  . piperacillin-tazobactam (ZOSYN)  IV  3.375 g Intravenous Q8H  . potassium chloride  10 mEq Intravenous Q1 Hr x 4  . rocuronium      . succinylcholine      . thiamine  100 mg Intravenous Daily  . vancomycin  750 mg Intravenous Q12H  . DISCONTD: antiseptic oral rinse  15 mL Mouth Rinse BID  . DISCONTD: folic acid  1 mg Oral Daily  . DISCONTD: furosemide  40 mg Intravenous Once  . DISCONTD: LORazepam  0-4 mg Intravenous Q12H  . DISCONTD: multivitamin with minerals  1 tablet Oral Daily  . DISCONTD: oxyCODONE  10 mg Oral Q12H  . DISCONTD: potassium chloride  10 mEq Intravenous Q1 Hr  x 4  . DISCONTD: thiamine  100 mg Oral Daily   Continuous Infusions:   . sodium chloride 20 mL/hr at 12/31/11 0508  . fentaNYL infusion INTRAVENOUS 175 mcg/hr (01/01/12 1100)  . midazolam (VERSED) infusion 4 mg/hr (01/01/12 1100)   PRN Meds:.acetaminophen, bisacodyl, fentaNYL, midazolam, ondansetron (ZOFRAN) IV, DISCONTD: acetaminophen, DISCONTD: alum & mag hydroxide-simeth, DISCONTD: HYDROcodone-acetaminophen, DISCONTD:  HYDROmorphone (DILAUDID) injection, DISCONTD: LORazepam, DISCONTD: LORazepam, DISCONTD: ondansetron, DISCONTD: oxyCODONE, DISCONTD: traMADol, DISCONTD: zolpidem   Ht: 5\' 6"  (167.6 cm)  Wt: 173 lb 4.5 oz (78.6 kg)  Ideal Wt: 59.09 kg % Ideal Wt: 133% Wt Readings from Last 10 Encounters:  01/01/12 173 lb 4.5 oz (78.6 kg)  10/05/11 153 lb 11.2 oz (69.718 kg)  10/05/11 153 lb 11.2 oz (69.718 kg)  10/05/11 153 lb 11.2 oz (69.718 kg)  09/22/11 155 lb 10.3 oz (70.6 kg)  03/24/11 163 lb 12.8 oz (74.3 kg)  03/24/11 163 lb 12.8 oz (74.3 kg)  09/01/10 154 lb (69.854 kg)  08/19/10 152 lb (68.947 kg)  10/10/08 159 lb 6.1 oz (72.295 kg)  *Weight up significantly over three months, recommend re-weigh patient for accuracy.  Body mass index is 27.97 kg/(m^2). (Overweight)  Food/Nutrition Related Hx: Patient intubated on mechanical ventilation. ARDS protocol. OGT in place. No family present at time of RD visit.   Labs:  CMP     Component Value Date/Time   NA 137 01/01/2012 0356   K 3.9 01/01/2012 0356   CL 106 01/01/2012 0356   CO2 20 01/01/2012 0356   GLUCOSE 106* 01/01/2012 0356   BUN 28* 01/01/2012 0356   CREATININE 0.68 01/01/2012 0356   CALCIUM 7.7* 01/01/2012 0356   PROT 5.4* 01/01/2012 0356   ALBUMIN 1.6* 01/01/2012 0356   AST 24 01/01/2012 0356   ALT 9 01/01/2012 0356   ALKPHOS 84 01/01/2012 0356   BILITOT 0.4 01/01/2012 0356   GFRNONAA >90 01/01/2012 0356   GFRAA >90 01/01/2012 0356    Intake/Output Summary (Last 24 hours) at 01/01/12 1155 Last data filed at  01/01/12 1100  Gross per 24 hour  Intake   1387 ml  Output    795 ml  Net    592 ml     Diet Order:  NPO  Supplements/Tube Feeding: none at this time   IVF:    sodium chloride Last Rate: 20 mL/hr at 12/31/11 0508  fentaNYL infusion INTRAVENOUS Last Rate: 175 mcg/hr (01/01/12 1100)  midazolam (VERSED) infusion Last Rate: 4 mg/hr (01/01/12 1100)   MVe: 11.5; TMax: 38.1   Estimated Nutritional Needs:   Kcal: 1758 Protein: 86-102 grams  Fluid: 1 ml per kcal intake   NUTRITION DIAGNOSIS: -Inadequate oral intake (NI-2.1).  Status: Ongoing  RELATED TO: inability to eat   AS EVIDENCE BY: NPO status and mechanical ventilation.   MONITORING/EVALUATION(Goals): TF initiation/ tolerance, weights, labs, vent status 1. Positive tolerance of TF at goal.  2. Meet > 90% of estimated energy needs with nutrition support.   EDUCATION NEEDS: -No education needs identified at this time  INTERVENTION: 1. Initiate TF Oxepa @ 20 ml/hr and increase by 10 ml every 4 hours to a goal rate of 45 ml/hr plus 1 packet prostat daily. Total enteral nutrition to provide 1720 kcal (meets 98% of estimated energy needs), 83 grams protein (meets 97% of estimated energy needs), and 853 ml free water.  2. RD to follow for nutrition plan of care.    Dietitian [phone]  DOCUMENTATION CODES Per approved criteria  -Not Applicable    Iven Finn [facility] 01/01/2012, 11:54 AM

## 2012-01-01 NOTE — Significant Event (Signed)
A line non-functional.  Acid/base status stable, not on pressors, and oxygenation requirements improving.  Will d/c aline.  No need to re-insert new aline at this time.  Coralyn Helling, MD 01/01/2012, 4:48 PM

## 2012-01-01 NOTE — Clinical Social Work Note (Signed)
CSW discussed case with RN and need to notify family. CSW to follow and assist.   Doreen Salvage, LCSWA ICU/Stepdown Clinical Social Worker [facility] Cell [phone] Hours 8am-1200pm M-F

## 2012-01-01 NOTE — Progress Notes (Signed)
Spoke with emergency contact person, Cletis Media - friend, and son Roben Tatsch about pt's admission in ICU and on ventilator. Both acknowledged information.

## 2012-01-02 ENCOUNTER — Inpatient Hospital Stay (HOSPITAL_COMMUNITY): Payer: MEDICAID

## 2012-01-02 LAB — COMPREHENSIVE METABOLIC PANEL
ALT: 10 U/L (ref 0–35)
ALT: 9 U/L (ref 0–35)
AST: 45 U/L — ABNORMAL HIGH (ref 0–37)
AST: 29 U/L (ref 0–37)
Albumin: 1.6 g/dL — ABNORMAL LOW (ref 3.5–5.2)
Alkaline Phosphatase: 122 U/L — ABNORMAL HIGH (ref 39–117)
CO2: 23 mEq/L (ref 19–32)
Calcium: 7.9 mg/dL — ABNORMAL LOW (ref 8.4–10.5)
Calcium: 7.7 mg/dL — ABNORMAL LOW (ref 8.4–10.5)
GFR calc Af Amer: >90 mL/min (ref >90)
Potassium: 4.8 mEq/L (ref 3.5–5.1)
Sodium: 138 mEq/L (ref 135–145)
Sodium: 138 mEq/L (ref 135–145)
Total Protein: 5.9 g/dL — ABNORMAL LOW (ref 6.0–8.3)
Total Protein: 5.5 g/dL — ABNORMAL LOW (ref 6.0–8.3)

## 2012-01-02 LAB — DIC (DISSEMINATED INTRAVASCULAR COAGULATION)PANEL
D-Dimer, Quant: 3.67 ug/mL-FEU — ABNORMAL HIGH (ref 0.00–0.48)
INR: 1.31 (ref 0.00–1.49)
Platelets: 417 10*3/uL — ABNORMAL HIGH (ref 150–400)
aPTT: 34 seconds (ref 24–37)

## 2012-01-02 LAB — GLUCOSE, CAPILLARY
Glucose-Capillary: 126 mg/dL — ABNORMAL HIGH (ref 70–99)
Glucose-Capillary: 133 mg/dL — ABNORMAL HIGH (ref 70–99)
Glucose-Capillary: 128 mg/dL — ABNORMAL HIGH (ref 70–99)
Glucose-Capillary: 109 mg/dL — ABNORMAL HIGH (ref 70–99)

## 2012-01-02 LAB — BLOOD GAS, ARTERIAL
Acid-base deficit: 3.7 mmol/L — ABNORMAL HIGH (ref 0.0–2.0)
Drawn by: 232811
MECHVT: 360 mL
Patient temperature: 102.5
RATE: 26 resp/min
TCO2: 17 mmol/L (ref 0–100)
pCO2 arterial: 31.3 mmHg — ABNORMAL LOW (ref 35.0–45.0)
pH, Arterial: 7.409 (ref 7.350–7.450)

## 2012-01-02 LAB — CBC
HCT: 32.9 % — ABNORMAL LOW (ref 36.0–46.0)
Hemoglobin: 10.5 g/dL — ABNORMAL LOW (ref 12.0–15.0)
MCH: 27.4 pg (ref 26.0–34.0)
MCHC: 31.9 g/dL (ref 30.0–36.0)
MCHC: 32.3 g/dL (ref 30.0–36.0)
MCV: 85.5 fL (ref 78.0–100.0)
Platelets: 417 10*3/uL — ABNORMAL HIGH (ref 150–400)
RBC: 3.43 MIL/uL — ABNORMAL LOW (ref 3.87–5.11)
RDW: 16.4 % — ABNORMAL HIGH (ref 11.5–15.5)

## 2012-01-02 LAB — PHOSPHORUS: Phosphorus: 4.2 mg/dL (ref 2.3–4.6)

## 2012-01-02 MED ORDER — SODIUM CHLORIDE 0.9 % IV SOLN
80.0000 mg | INTRAVENOUS | Status: AC
  Administered 2012-01-02: 80 mg via INTRAVENOUS
  Filled 2012-01-02: qty 80

## 2012-01-02 MED ORDER — IOHEXOL 300 MG/ML  SOLN
100.0000 mL | Freq: Once | INTRAMUSCULAR | Status: AC | PRN
  Administered 2012-01-02: 100 mL via INTRAVENOUS

## 2012-01-02 MED ORDER — LORAZEPAM 2 MG/ML PO CONC
1.0000 mg | Freq: Two times a day (BID) | ORAL | Status: DC
  Administered 2012-01-02: 1 mg
  Filled 2012-01-02: qty 1

## 2012-01-02 MED ORDER — SODIUM CHLORIDE 0.9 % IV SOLN
8.0000 mg/h | INTRAVENOUS | Status: DC
  Administered 2012-01-02 – 2012-01-03 (×2): 8 mg/h via INTRAVENOUS
  Filled 2012-01-02 (×4): qty 80

## 2012-01-02 MED ORDER — OXEPA PO LIQD
1000.0000 mL | ORAL | Status: DC
  Filled 2012-01-02: qty 1000

## 2012-01-02 NOTE — Progress Notes (Signed)
VASCULAR LAB PRELIMINARY  PRELIMINARY  PRELIMINARY  PRELIMINARY  Bilateral upper extremity venous Dopplers completed.    Preliminary report:  There is no obvious evidence of  DVT or SVT noted in the bilateral upper extremities.  Canda Podgorski, 01/02/2012, 12:12 PM

## 2012-01-02 NOTE — Progress Notes (Signed)
Name: LENAH MESSENGER MRN: [id number] DOB: January 19, 1956    LOS: 7  Referring Provider:  [name] Reason for Referral:  Acute Respiratory Failure  PULMONARY / CRITICAL CARE MEDICINE  HPI:  Otie Headlee is a 56 y.o. female who presented to [facility] ER on 12/26/11 with complaints of epigastric pain (10/10), fever, chills, nausea, and vomiting. The patient has a history of ETOH abuse, but reports that she has not been able to drink more than a few ounces of alcohol due to her pancreatitis. Her last drink was on 09/05. MRCP was done 10/06/2011 which showed dilated common bile duct with biliary sludge. She subsequently underwent an ERCP with sphincterotomy on 10/06/2011.   According to the medical record she was confused at the time of admission.  [facility] admitted the patient, she was fluid resuscitated and placed on prophylactic abx, alcohol withdrawal protocol, and pain management.  Initially the patient's lipase improved from 177 to 126, and her LFTs remained normal.  On 9/12  RRT was called for respiratory decline and lethargy.  The patient was given 40 mg of lasix IV and her oxygen increased to 50% via venti mask.  She was transferred to ICU for closer monitoring.  Events Since Admission: 9/12 RRT and subsequent transfer to the ICU. 9/14 TFs held due to firm abdomen & sub glottic suctioning, WC higher  Current Status: Critical Febrile,  9/14 TFs held due to firm abdomen & sub glottic suctioning, WC higher  Vital Signs: Temp:  [100.4 F (38 C)-103.2 F (39.6 C)] 103.2 F (39.6 C) (09/14 0800) Pulse Rate:  [91-112] 108  (09/14 0800) Resp:  [20-32] 24  (09/14 0800) BP: (99-136)/(59-79) 128/70 mmHg (09/14 0800) SpO2:  [93 %-96 %] 93 % (09/14 0800) Arterial Line BP: (73-128)/(64-95) 85/64 mmHg (09/13 1400) FiO2 (%):  [50 %] 50 % (09/14 0800) Weight:  [79.7 kg (175 lb 11.3 oz)] 79.7 kg (175 lb 11.3 oz) (09/14 0400)  Physical Examination: General: chronically ill  appearance, sedated and  intubated. HEENT: Supple neck, no JVD, no tracheal deviation present.  Head: Normocephalic Mouth/Throat: Intubated Eyes: Conjunctivae are normal. Pupils are equal, round, and reactive to light. No scleral icterus.   Cardiovascular: Tachycardic, regular rhythm, normal heart sounds, S1 S2 and intact distal pulses.  Pulmonary/Chest: Coarse BS diffusely.  Abdominal: Soft but rounded and distended.  Tender to deep palpation. No guarding. Hyperactive bowel sounds. Musculoskeletal: She exhibits no edema and no tenderness.  Neurological: Sedated but moves all ext to command.   Principal Problem:  *Acute pancreatitis Active Problems:  Alcohol abuse  Epigastric abdominal pain  Leukocytosis  Hyponatremia  Acute respiratory failure  Shock  ARDS (adult respiratory distress syndrome)   ASSESSMENT AND PLAN  PULMONARY  Lab 01/02/12 0330 01/01/12 0350 12/31/11 1540 12/31/11 1210 12/31/11 0442  PHART 7.409 7.402 7.440 7.494* 7.403  PCO2ART 31.3* 33.3* 28.6* 28.6* 33.4*  PO2ART 88.5 108.0* 81.4 55.7* 59.0*  HCO3 18.9* 20.0 19.1* 21.8 20.1  O2SAT 95.5 97.6 95.9 91.2 88.3    CXR: 9/14 Increasing bibasilar atelectasis verses airspace disease. ETT 9/12>>>  A: 1)  Acute Hypoxic respiratory failure w/ respiratory alkalosis,  in setting of bilateral pulmonary infiltrates - ARDS  2)  Probable PNA HCAP vs Aspiration.       P:   Ventilate on ARDS protocol. Monitor CVP and keep on the dry side. Drop PEEP per protocol Will need diureses when BP is more stable.  CARDIOVASCULAR  Lab 12/31/11 1220 12/31/11 0650  TROPONINI <0.30 <0.30  LATICACIDVEN  1.4 --  PROBNP -- --   ECG:   Sinus Tachycardia at 105 Lines:  RIJ 9/13 >>  A: SIRS vs prob sepsis      Tachycardia In setting of pancreatitis, may be exacerbated by new pulmonary infection P: Continuous telemetry  RENAL  Lab 01/02/12 0415 01/01/12 0356 12/31/11 1220 12/31/11 0338 12/30/11 0335 12/27/11 0419  NA 138 137 135 136 133* --  K  4.8 3.9 -- -- -- --  CL 107 106 101 107 105 --  CO2 20 20 23 21 20  --  BUN 34* 28* 23 25* 37* --  CREATININE 0.77 0.68 0.69 0.63 0.98 --  CALCIUM 7.9* 7.7* 7.9* 7.8* 7.9* --  MG 2.6* 2.4 2.1 -- 3.0* 1.4*  PHOS 4.2 3.2 -- -- -- --   Intake/Output      09/13 0701 - 09/14 0700 09/14 0701 - 09/15 0700   I.V. (mL/kg) 1134.5 (14.2) 62.5 (0.8)   NG/GT 535    IV Piggyback 350    Total Intake(mL/kg) 2019.5 (25.3) 62.5 (0.8)   Urine (mL/kg/hr) 675 (0.4) 150   Stool     Total Output 675 150   Net +1344.5 -87.5         Foley:  intact  A: Acute renal insufficiency - likely from previous episodes of hypotension which has now resolved.     Hyponatremia: better   P:  Strict I&O.        Will hold on further diuresis and keep fluid even - since contrast being given today      Replace electrolytes as needed.       GASTROINTESTINAL  Lab 01/02/12 0415 01/01/12 0356 12/31/11 1220 12/31/11 0338 12/30/11 0335  AST 45* 24 18 22 18   ALT 10 9 9 9 11   ALKPHOS 122* 84 77 103 80  BILITOT 0.4 0.4 0.4 0.3 0.2*  PROT 5.9* 5.4* 5.4* 5.3* 5.2*  ALBUMIN 1.6* 1.6* 1.6* 1.6* 1.7*   Lipase     Component Value Date/Time   LIPASE 58 12/31/2011 1220   A:  Acute Pancreatitis - lipase levels were improved, liver function test normal P:  Recheck CMP, lipase ina m       Keep TFs @ 20/h, try to obtain post pyloric tube.       Pepcid ordered for GI px       Ct abd with contrast  HEMATOLOGIC  Lab 01/02/12 0415 01/01/12 0356 12/31/11 1220 12/31/11 0338 12/29/11 0345  HGB 10.5* 9.4* 10.0* 9.8* 11.4*  HCT 32.9* 28.9* 29.7* 30.2* 37.1  PLT 400 299 253 239 288  INR -- -- -- -- --  APTT -- -- -- -- --   A:  Mild Anemia - likely hemodilution from fluid administration P:  Recheck CBC      Transfuse for HGB less than 7   INFECTIOUS  Lab 01/02/12 0415 01/01/12 0356 12/31/11 1220 12/31/11 0338 12/29/11 0345  WBC 38.8* 20.4* 15.0* 13.1* 10.5  PROCALCITON -- -- 2.93 -- --   Cultures: Blood - 9/11>>> Urine  - 9/8 - Multiple bacterial morphotypes  Antibiotics: Zosyn 9/11>>> Vanc 9/12>>>  A: SIRS vs sepsis     UTI - positive for leuks, and nitrites, cultures likely contaminated     Probable PNA  P:  Follow WBC      Urine Culture      Continue empiric ABX r/t possible PNA CT abd, chk pct in am DOppler RUE  ENDOCRINE CBG (last 3)   Basename 01/02/12 1610  01/02/12 0303 01/01/12 2328  GLUCAP 162* 133* 126*   A: No issues currently   P: CBG Q4H r/t high risk for hyperglycemia  NEUROLOGIC A: Alcohol withdrawal/ acute encephalopathy      Pain r/t acute pancreatitis     Chronic narcotic denpendence  P: Fentanyl.     CIWA protocol ordered Q4H.     Versed and fentanyl drips - add po ativan to decrease drips.   BEST PRACTICE / DISPOSITION Level of Care:  ICU Primary Service:  PCCM Consultants: CCM Code Status:  Full Diet:  TFs DVT Px: Lovenox, SCDs GI Px:  Pepcid Skin Integrity:  Inact Social / Family:  Unavalible  Care during the described time interval was provided by me and/or other providers on the critical care team.  I have reviewed this patient's available data, including medical history, events of note, physical examination and test results as part of my evaluation  CC time x  40 m  Cyril Mourning MD. Tonny Bollman. [place] [facility] Pager [phone] If no response call [phone]

## 2012-01-03 ENCOUNTER — Encounter (HOSPITAL_COMMUNITY): Payer: Self-pay | Admitting: Anesthesiology

## 2012-01-03 ENCOUNTER — Inpatient Hospital Stay (HOSPITAL_COMMUNITY): Payer: MEDICAID

## 2012-01-03 ENCOUNTER — Inpatient Hospital Stay (HOSPITAL_COMMUNITY): Payer: MEDICAID | Admitting: Anesthesiology

## 2012-01-03 ENCOUNTER — Encounter (HOSPITAL_COMMUNITY): Admission: EM | Disposition: A | Discharge status: Home Health | Payer: Self-pay | Source: Home / Self Care

## 2012-01-03 DIAGNOSIS — K862 Cyst of pancreas: Secondary | ICD-10-CM

## 2012-01-03 DIAGNOSIS — R142 Eructation: Secondary | ICD-10-CM

## 2012-01-03 DIAGNOSIS — R143 Flatulence: Secondary | ICD-10-CM

## 2012-01-03 DIAGNOSIS — K863 Pseudocyst of pancreas: Secondary | ICD-10-CM

## 2012-01-03 DIAGNOSIS — R141 Gas pain: Secondary | ICD-10-CM

## 2012-01-03 HISTORY — PX: LAPAROTOMY: SHX154

## 2012-01-03 LAB — CBC
Platelets: 566 10*3/uL — ABNORMAL HIGH (ref 150–400)
RBC: 3.42 MIL/uL — ABNORMAL LOW (ref 3.87–5.11)
RDW: 16.7 % — ABNORMAL HIGH (ref 11.5–15.5)
WBC: 39.3 10*3/uL — ABNORMAL HIGH (ref 4.0–10.5)

## 2012-01-03 LAB — COMPREHENSIVE METABOLIC PANEL
Alkaline Phosphatase: 137 U/L — ABNORMAL HIGH (ref 39–117)
BUN: 32 mg/dL — ABNORMAL HIGH (ref 6–23)
Chloride: 109 mEq/L (ref 96–112)
Creatinine, Ser: 0.77 mg/dL (ref 0.50–1.10)
GFR calc Af Amer: >90 mL/min (ref >90)
Glucose, Bld: 136 mg/dL — ABNORMAL HIGH (ref 70–99)
Potassium: 4 mEq/L (ref 3.5–5.1)
Total Bilirubin: 0.4 mg/dL (ref 0.3–1.2)

## 2012-01-03 LAB — URINE CULTURE
Colony Count: NO GROWTH
Culture: NO GROWTH
Special Requests: NORMAL

## 2012-01-03 LAB — BLOOD GAS, ARTERIAL
Acid-base deficit: 2.7 mmol/L — ABNORMAL HIGH (ref 0.0–2.0)
Acid-base deficit: 10 mmol/L — ABNORMAL HIGH (ref 0.0–2.0)
Bicarbonate: 20 mEq/L (ref 20.0–24.0)
Bicarbonate: 15.2 mEq/L — ABNORMAL LOW (ref 20.0–24.0)
Drawn by: 308601
FIO2: 0.4 %
FIO2: 0.4 %
MECHVT: 360 mL
O2 Saturation: 90.4 %
O2 Saturation: 92.5 %
PEEP: 5 cmH2O
Patient temperature: 98.6
Patient temperature: 98.6
RATE: 26 resp/min
TCO2: 18.2 mmol/L (ref 0–100)
TCO2: 14.3 mmol/L (ref 0–100)
TCO2: 15 mmol/L (ref 0–100)
pH, Arterial: 7.366 (ref 7.350–7.450)
pO2, Arterial: 76.5 mmHg — ABNORMAL LOW (ref 80.0–100.0)

## 2012-01-03 LAB — BODY FLUID CELL COUNT WITH DIFFERENTIAL
Other Cells, Fluid: UNDETERMINED %
Total Nucleated Cell Count, Fluid: UNDETERMINED cu mm (ref 0–1000)

## 2012-01-03 LAB — GLUCOSE, CAPILLARY: Glucose-Capillary: 109 mg/dL — ABNORMAL HIGH (ref 70–99)

## 2012-01-03 SURGERY — LAPAROTOMY, EXPLORATORY
Anesthesia: General | Site: Abdomen | Wound class: Dirty or Infected

## 2012-01-03 MED ORDER — FENTANYL CITRATE 0.05 MG/ML IJ SOLN
INTRAMUSCULAR | Status: DC | PRN
  Administered 2012-01-03 (×4): 100 ug via INTRAVENOUS

## 2012-01-03 MED ORDER — PANTOPRAZOLE SODIUM 40 MG IV SOLR
40.0000 mg | Freq: Two times a day (BID) | INTRAVENOUS | Status: DC
  Administered 2012-01-04 – 2012-01-05 (×5): 40 mg via INTRAVENOUS
  Filled 2012-01-03 (×7): qty 40

## 2012-01-03 MED ORDER — 0.9 % SODIUM CHLORIDE (POUR BTL) OPTIME
TOPICAL | Status: DC | PRN
  Administered 2012-01-03 (×2): 1000 mL

## 2012-01-03 MED ORDER — NOREPINEPHRINE BITARTRATE 1 MG/ML IJ SOLN
2.0000 ug/min | INTRAVENOUS | Status: DC
  Administered 2012-01-03: 2 ug/min via INTRAVENOUS
  Administered 2012-01-04: 8 ug/min via INTRAVENOUS
  Administered 2012-01-04: 15 ug/min via INTRAVENOUS
  Administered 2012-01-04: 12 ug/min via INTRAVENOUS
  Administered 2012-01-05: 10 ug/min via INTRAVENOUS
  Filled 2012-01-03 (×5): qty 4

## 2012-01-03 MED ORDER — SODIUM CHLORIDE 0.9 % IV BOLUS (SEPSIS)
1000.0000 mL | Freq: Once | INTRAVENOUS | Status: AC
  Administered 2012-01-03: 1000 mL via INTRAVENOUS

## 2012-01-03 MED ORDER — PROPOFOL 10 MG/ML IV EMUL
5.0000 ug/kg/min | INTRAVENOUS | Status: DC
  Administered 2012-01-03: 12 ug/kg/min via INTRAVENOUS
  Filled 2012-01-03: qty 100

## 2012-01-03 MED ORDER — SODIUM CHLORIDE 0.9 % IV BOLUS (SEPSIS)
500.0000 mL | Freq: Once | INTRAVENOUS | Status: AC
  Administered 2012-01-03: 500 mL via INTRAVENOUS

## 2012-01-03 MED ORDER — METRONIDAZOLE IN NACL 5-0.79 MG/ML-% IV SOLN
500.0000 mg | Freq: Three times a day (TID) | INTRAVENOUS | Status: DC
  Administered 2012-01-03 – 2012-01-23 (×59): 500 mg via INTRAVENOUS
  Filled 2012-01-03 (×66): qty 100

## 2012-01-03 MED ORDER — CISATRACURIUM BESYLATE (PF) 10 MG/5ML IV SOLN
INTRAVENOUS | Status: DC | PRN
  Administered 2012-01-03: 6 mg via INTRAVENOUS
  Administered 2012-01-03 (×2): 14 mg via INTRAVENOUS

## 2012-01-03 MED ORDER — PHENYLEPHRINE HCL 10 MG/ML IJ SOLN
30.0000 ug/min | INTRAVENOUS | Status: DC
  Administered 2012-01-03: 30 ug/min via INTRAVENOUS
  Filled 2012-01-03: qty 1

## 2012-01-03 MED ORDER — LACTATED RINGERS IV SOLN
INTRAVENOUS | Status: DC | PRN
  Administered 2012-01-03: 13:00:00 via INTRAVENOUS

## 2012-01-03 MED ORDER — LACTATED RINGERS IV SOLN
INTRAVENOUS | Status: DC | PRN
  Administered 2012-01-03 (×2): via INTRAVENOUS

## 2012-01-03 MED ORDER — SODIUM BICARBONATE 8.4 % IV SOLN
INTRAVENOUS | Status: DC
  Administered 2012-01-03 – 2012-01-04 (×2): via INTRAVENOUS
  Filled 2012-01-03 (×4): qty 150

## 2012-01-03 MED ORDER — PANTOPRAZOLE SODIUM 40 MG IV SOLR: 40.0000 mg | Freq: Two times a day (BID) | INTRAVENOUS | Status: DC

## 2012-01-03 MED ORDER — PHENYLEPHRINE HCL 10 MG/ML IJ SOLN
30.0000 ug/min | INTRAVENOUS | Status: DC
  Administered 2012-01-03: 250 ug/min via INTRAVENOUS
  Filled 2012-01-03 (×2): qty 4

## 2012-01-03 MED ORDER — KETAMINE HCL 10 MG/ML IJ SOLN
INTRAMUSCULAR | Status: DC | PRN
  Administered 2012-01-03 (×2): 10 mg via INTRAVENOUS
  Administered 2012-01-03: 30 mg via INTRAVENOUS

## 2012-01-03 SURGICAL SUPPLY — 42 items
APPLICATOR COTTON TIP 6IN STRL (MISCELLANEOUS) ×2 IMPLANT
BANDAGE GAUZE ELAST BULKY 4 IN (GAUZE/BANDAGES/DRESSINGS) ×2 IMPLANT
BLADE EXTENDED COATED 6.5IN (ELECTRODE) ×2 IMPLANT
BLADE HEX COATED 2.75 (ELECTRODE) ×2 IMPLANT
CANISTER SUCTION 2500CC (MISCELLANEOUS) ×2 IMPLANT
CLOTH BEACON ORANGE TIMEOUT ST (SAFETY) ×2 IMPLANT
COVER MAYO STAND STRL (DRAPES) ×2 IMPLANT
DRAIN CHANNEL RND F F (WOUND CARE) ×4 IMPLANT
DRAPE LAPAROSCOPIC ABDOMINAL (DRAPES) ×2 IMPLANT
DRAPE WARM FLUID 44X44 (DRAPE) ×2 IMPLANT
DRSG PAD ABDOMINAL 8X10 ST (GAUZE/BANDAGES/DRESSINGS) ×2 IMPLANT
ELECT REM PT RETURN 9FT ADLT (ELECTROSURGICAL) ×2
ELECTRODE REM PT RTRN 9FT ADLT (ELECTROSURGICAL) ×1 IMPLANT
GLOVE BIOGEL M 8.0 STRL (GLOVE) ×2 IMPLANT
GLOVE BIOGEL PI IND STRL 7.0 (GLOVE) ×1 IMPLANT
GLOVE BIOGEL PI INDICATOR 7.0 (GLOVE) ×1
GOWN STRL NON-REIN LRG LVL3 (GOWN DISPOSABLE) ×2 IMPLANT
GOWN STRL REIN XL XLG (GOWN DISPOSABLE) ×4 IMPLANT
KIT BASIN OR (CUSTOM PROCEDURE TRAY) ×2 IMPLANT
NS IRRIG 1000ML POUR BTL (IV SOLUTION) ×6 IMPLANT
PACK GENERAL/GYN (CUSTOM PROCEDURE TRAY) ×2 IMPLANT
SPONGE GAUZE 4X4 12PLY (GAUZE/BANDAGES/DRESSINGS) ×2 IMPLANT
SPONGE LAP 18X18 X RAY DECT (DISPOSABLE) ×4 IMPLANT
STAPLER VISISTAT 35W (STAPLE) ×2 IMPLANT
SUCTION POOLE TIP (SUCTIONS) ×2 IMPLANT
SUT ETHILON 2 0 PS N (SUTURE) ×4 IMPLANT
SUT PDS AB 1 CTX 36 (SUTURE) IMPLANT
SUT PDS AB 1 TP1 96 (SUTURE) ×4 IMPLANT
SUT SILK 2 0 (SUTURE) ×1
SUT SILK 2 0 SH CR/8 (SUTURE) ×2 IMPLANT
SUT SILK 2-0 18XBRD TIE 12 (SUTURE) ×1 IMPLANT
SUT SILK 3 0 (SUTURE)
SUT SILK 3 0 SH CR/8 (SUTURE) ×2 IMPLANT
SUT SILK 3-0 18XBRD TIE 12 (SUTURE) IMPLANT
SUT VICRYL 2 0 18  UND BR (SUTURE)
SUT VICRYL 2 0 18 UND BR (SUTURE) IMPLANT
SWAB COLLECTION DEVICE MRSA (MISCELLANEOUS) ×2 IMPLANT
TAPE CLOTH SURG 4X10 WHT LF (GAUZE/BANDAGES/DRESSINGS) ×2 IMPLANT
TOWEL OR 17X26 10 PK STRL BLUE (TOWEL DISPOSABLE) ×4 IMPLANT
TRAY FOLEY CATH 14FRSI W/METER (CATHETERS) IMPLANT
TUBE ANAEROBIC SPECIMEN COL (MISCELLANEOUS) ×2 IMPLANT
YANKAUER SUCT BULB TIP NO VENT (SUCTIONS) ×2 IMPLANT

## 2012-01-03 NOTE — Anesthesia Postprocedure Evaluation (Signed)
  Anesthesia Post-op Note  Patient: [name]  Procedure(s) Performed: Procedure(s) (LRB) with comments: EXPLORATORY LAPAROTOMY (N/A) - debridement of necrotic pancreas and placement of drains x2  Patient Location: ICU  Anesthesia Type: General  Level of Consciousness: sedated, unresponsive and Patient remains intubated per anesthesia plan  Airway and Oxygen Therapy: Patient remains intubated per anesthesia plan  Post-op Pain: Sedated  Post-op Assessment: Post-op Vital signs reviewed and Patient's Cardiovascular Status Stable  Post-op Vital Signs: Reviewed  Complications: No apparent anesthesia complications

## 2012-01-03 NOTE — Progress Notes (Signed)
Attempted to contact patient's son Reuel Boom in the event that we would need to obtain consent for additional testing/procedures.  Left v/m.  Will continue to monitor.

## 2012-01-03 NOTE — Progress Notes (Signed)
Attempts to start an A-line by RT were unsuccessful. Anesthesia was called in to placed the line.

## 2012-01-03 NOTE — Progress Notes (Signed)
Attempts by anesthesia (CRNA)  To place an A-line was unsuccessful. MD made aware.

## 2012-01-03 NOTE — Progress Notes (Signed)
Patient ID: [name], female   DOB: September 26, 1955, 56 y.o.   MRN: [id number] The patient has marked leukocytosis and necrotic pancreatitis. Infalmmation and ascites has increased. Paracentesis is requested. If family cannot be located, I agree with emergent consent for the procedure as it is low risk to the patient. We will limit the tap to 3 L.

## 2012-01-03 NOTE — Transfer of Care (Signed)
Immediate Anesthesia Transfer of Care Note  Patient: [name]  Procedure(s) Performed: Procedure(s) (LRB) with comments: EXPLORATORY LAPAROTOMY (N/A) - debridement of necrotic pancreas and placement of drains x2  Patient Location: PACU and ICU  Anesthesia Type: General  Level of Consciousness: sedated and unresponsive  Airway & Oxygen Therapy: Patient remains intubated per anesthesia plan  Post-op Assessment: Report given to PACU RN and Post -op Vital signs reviewed and stable  Post vital signs: Reviewed and stable  Complications: No apparent anesthesia complications

## 2012-01-03 NOTE — Op Note (Signed)
Surgeon: Wenda Low, MD, FACS  Asst:  none  Anes:  general  Procedure: Exploratory laparotomy with debridement of necrotic pancreas and drainage of the upper abdomen  Diagnosis: Ruptured pseudocyst with peritonitis  Complications: none  EBL:   25 cc  Description of Procedure:  The patient was taken on the ventilator from the ICU directly to OR 1 at [facility].  The patient was taken to or 1 for exploratory laparotomy. After anesthesia was administered and the patient prepped and draped in a timeout performed entered the abdomen using a midline incision.  Upon entering the abdomen I encountered "pea green soup" material that was copious in throughout the entire abdomen. This was not foul-smelling however and I cultured it for both aerobes and anaerobes. I explored the abdomen and found that the source of the inflammation was in the right upper quadrant and in the retro-gastric space. I entered this and found the lesser sac to contain a ruptured pseudocyst. Encountered this bile-stained fluid there. I went ahead and reduced the hiatal hernia and inserted an NG tube into the stomach. A blue of the stomach with air and submerged and saw no evidence of any foregut perforation. Foraminotomy beginning at the ligament of Treitz and took down many fresh a daily of adhesions and debrided this material. Around the bowel to the terminal ileum. I then irrigated with 4 L of salin and I placed 219 Blake drains through separate incisions in the upper abdomen placing one in the retrogastric lesser sac and the other anterior stomach up near the diaphragm.  Sponge and needle counts reported as correct. I closed with double-stranded #1 PDS from above and below and packed the wound open. Patient was transported back to the intensive care unit.   Matt B. Daphine Deutscher, MD, [name], [place] [phone]

## 2012-01-03 NOTE — Progress Notes (Signed)
Name: [name] MRN: [id number] DOB: 07/20/1955  ELECTRONIC ICU PHYSICIAN NOTE  Problem:  C. Diff PCR positive.  Agitated / asynchronous / hypertensive.  Intervention:  Flagyl started.  Versed gtt d/c'd.  Propofol started.  ABG.  Orlean Bradford, M.D. Pulmonary and Critical Care Medicine [facility] Cell: [phone] Pager: [phone]  01/03/2012, 3:25 PM

## 2012-01-03 NOTE — Progress Notes (Signed)
Name: [name] MRN: [id number] DOB: 1955-11-22  ELECTRONIC ICU PHYSICIAN NOTE  Problem:  Metabolic acidosis.  Intervention:  Bicarbonate gtt started. ABG@2100 .  Orlean Bradford, M.D. Pulmonary and Critical Care Medicine [name] Cell: [phone] Pager: [phone]  01/03/2012, 4:56 PM

## 2012-01-03 NOTE — Anesthesia Preprocedure Evaluation (Addendum)
Anesthesia Evaluation  Patient identified by MRN, date of birth, ID band Patient unresponsive    Reviewed: Allergy & Precautions, H&P , NPO status , Patient's Chart, lab work & pertinent test results, Unable to perform ROS - Chart review only  History of Anesthesia Complications (+) PONV  Airway       Dental   Pulmonary neg pulmonary ROS,          Cardiovascular negative cardio ROS      Neuro/Psych PSYCHIATRIC DISORDERS Depression    GI/Hepatic GERD-  Medicated,(+) Cirrhosis -  ascites     ,   Endo/Other  negative endocrine ROS  Renal/GU negative Renal ROS     Musculoskeletal negative musculoskeletal ROS (+)   Abdominal   Peds  Hematology negative hematology ROS (+)   Anesthesia Other Findings   Reproductive/Obstetrics                          Anesthesia Physical Anesthesia Plan  ASA: IV and Emergent  Anesthesia Plan: General   Post-op Pain Management:    Induction: Inhalational  Airway Management Planned: Oral ETT  Additional Equipment: Arterial line  Intra-op Plan:   Post-operative Plan:   Informed Consent: I have reviewed the patients History and Physical, chart, labs and discussed the procedure including the risks, benefits and alternatives for the proposed anesthesia with the patient or authorized representative who has indicated his/her understanding and acceptance.   Only emergency history available and History available from chart only  Plan Discussed with: CRNA and Surgeon  Anesthesia Plan Comments:         Anesthesia Quick Evaluation

## 2012-01-03 NOTE — Preoperative (Signed)
Beta Blockers   Reason not to administer Beta Blockers:Not Applicable 

## 2012-01-03 NOTE — Progress Notes (Signed)
Patient hypotensive, SBPs 40-80s. ELink MD notified.  New orders received.  Will continue to monitor.

## 2012-01-03 NOTE — Progress Notes (Signed)
Name: [name] MRN: [id number] DOB: 1955/07/18    LOS: 8  Referring Provider:  [name] Reason for Referral:  Acute Respiratory Failure  PULMONARY / CRITICAL CARE MEDICINE  HPI:  [name] is a 56 y.o. female who presented to [facility] ER on 12/26/11 with complaints of epigastric pain (10/10), fever, chills, nausea, and vomiting. The patient has a history of ETOH abuse, but reports that she has not been able to drink more than a few ounces of alcohol due to her pancreatitis. Her last drink was on 09/05. MRCP was done 10/06/2011 which showed dilated common bile duct with biliary sludge. She subsequently underwent an ERCP with sphincterotomy on 10/06/2011.   According to the medical record she was confused at the time of admission.  [facility] admitted the patient, she was fluid resuscitated and placed on prophylactic abx, alcohol withdrawal protocol, and pain management.  Initially the patient's lipase improved from 177 to 126, and her LFTs remained normal.  On 9/12  RRT was called for respiratory decline and lethargy.  The patient was given 40 mg of lasix IV and her oxygen increased to 50% via venti mask.  She was transferred to ICU for closer monitoring.  Events Since Admission: 9/12 RRT and subsequent transfer to the ICU. 9/14 TFs held due to firm abdomen & sub glottic suctioning, WC higher 9/14 increased ascites, Persistent edema/inflammation surrounding the head of the pancreas, with an approximate 1.8 x 2.6 cm pseudocyst at the junction of the head and body; this is unchanged from the examination 3 days ago  and has increased slightly since the June, 2013 examination. Developing pseudocyst in the pancreatic head measuring approximately 1.3 x 2.6 cm, Large hiatal hernia. Nasogastric tube looped within the intrathoracic portion of the stomach. Oral contrast passes to rectum, Gaseous distention of the transverse colon.  9/14 doppler UE neg for DVT     Current Status: Critically ill remains  Febrile,    Vital Signs: Temp:  [99.7 F (37.6 C)-102.4 F (39.1 C)] 102 F (38.9 C) (09/15 0800) Pulse Rate:  [89-109] 104  (09/15 0800) Resp:  [17-32] 24  (09/15 0800) BP: (103-140)/(56-74) 107/70 mmHg (09/15 0800) SpO2:  [89 %-94 %] 91 % (09/15 0800) FiO2 (%):  [40 %] 40 % (09/15 0800) Weight:  [79.9 kg (176 lb 2.4 oz)] 79.9 kg (176 lb 2.4 oz) (09/15 0500)  Physical Examination: General: chronically ill  appearance, sedated and intubated. HEENT: Supple neck, no JVD, no tracheal deviation present.  Head: Normocephalic Mouth/Throat: Intubated Eyes: Conjunctivae are normal. Pupils are equal, round, and reactive to light. No scleral icterus.   Cardiovascular: Tachycardic, regular rhythm, normal heart sounds, S1 S2 and intact distal pulses.  Pulmonary/Chest: Coarse BS diffusely.  Abdominal: Soft but rounded and distended.  Tender to deep palpation. No guarding. Hyperactive bowel sounds. Musculoskeletal: She exhibits no edema and no tenderness.  Neurological: Sedated but moves all ext to command.   Principal Problem:  *Acute pancreatitis Active Problems:  Alcohol abuse  Epigastric abdominal pain  Leukocytosis  Hyponatremia  Acute respiratory failure  Shock  ARDS (adult respiratory distress syndrome)   ASSESSMENT AND PLAN  PULMONARY  Lab 01/03/12 0330 01/02/12 0330 01/01/12 0350 12/31/11 1540 12/31/11 1210  PHART 7.449 7.409 7.402 7.440 7.494*  PCO2ART 29.3* 31.3* 33.3* 28.6* 28.6*  PO2ART 59.7* 88.5 108.0* 81.4 55.7*  HCO3 20.0 18.9* 20.0 19.1* 21.8  O2SAT 90.4 95.5 97.6 95.9 91.2    CXR: 9/14 Increasing bibasilar atelectasis verses airspace disease. ETT 9/12>>>  A: 1)  Acute Hypoxic respiratory failure w/ respiratory alkalosis,  in setting of bilateral pulmonary infiltrates - ARDS  2)  Probable PNA HCAP vs Aspiration.       P:   Ventilate on ARDS protocol - down to 40%/PEEP 5. Monitor CVP and keep on the dry side. Will need diureses when  more  stable.  CARDIOVASCULAR  Lab 01/02/12 1752 12/31/11 1220 12/31/11 0650  TROPONINI -- <0.30 <0.30  LATICACIDVEN 1.3 1.4 --  PROBNP -- -- --   ECG:   Sinus Tachycardia at 105 Lines:  RIJ 9/13 >>  A: SIRS vs prob sepsis      Tachycardia In setting of pancreatitis, may be exacerbated by new pulmonary infection P: Continuous telemetry  RENAL  Lab 01/03/12 0550 01/02/12 1752 01/02/12 0415 01/01/12 0356 12/31/11 1220 12/30/11 0335  NA 141 138 138 137 135 --  K 4.0 3.7 -- -- -- --  CL 109 107 107 106 101 --  CO2 21 23 20 20 23  --  BUN 32* 32* 34* 28* 23 --  CREATININE 0.77 0.77 0.77 0.68 0.69 --  CALCIUM 7.6* 7.7* 7.9* 7.7* 7.9* --  MG -- -- 2.6* 2.4 2.1 3.0*  PHOS -- -- 4.2 3.2 -- --   Intake/Output      09/14 0701 - 09/15 0700 09/15 0701 - 09/16 0700   I.V. (mL/kg) 1729 (21.6) 77 (1)   NG/GT     IV Piggyback 550    Total Intake(mL/kg) 2279 (28.5) 77 (1)   Urine (mL/kg/hr) 1300 (0.7)    Emesis/NG output 1    Stool 1    Total Output 1302    Net +977 +77         Foley:  intact  A: Acute renal insufficiency - likely from previous episodes of hypotension which has now resolved.     Hyponatremia: better   P:  Strict I&O.        Will hold on further diuresis and keep fluid even      Replace electrolytes as needed.       GASTROINTESTINAL  Lab 01/03/12 0550 01/02/12 1752 01/02/12 0415 01/01/12 0356 12/31/11 1220  AST 46* 29 45* 24 18  ALT 9 9 10 9 9   ALKPHOS 137* 95 122* 84 77  BILITOT 0.4 0.5 0.4 0.4 0.4  PROT 6.0 5.5* 5.9* 5.4* 5.4*  ALBUMIN 1.6* 1.5* 1.6* 1.6* 1.6*   Lipase     Component Value Date/Time   LIPASE 49 01/03/2012 0550   A:  Acute Pancreatitis - lipase levels  improved, liver function test normal       Protein calorie malnutrition P:  Would like to start TFs @ 20/h, try to obtain post pyloric tube.     Change from protonix drip to q 12h US guided paracentesis today - discussed with IR, too small area of necrosis to FNA, send for cell count,  chemistry, culture        HEMATOLOGIC  Lab 01/03/12 0550 01/02/12 1752 01/02/12 0415 01/01/12 0356 12/31/11 1220  HGB 9.6* 9.4* 10.5* 9.4* 10.0*  HCT 29.3* 29.1* 32.9* 28.9* 29.7*  PLT 566* 417*417* 400 299 253  INR -- 1.31 -- -- --  APTT -- 34 -- -- --   A:  Mild Anemia - likely hemodilution from fluid administration P:  Recheck CBC      Transfuse for HGB less than 7   INFECTIOUS  Lab 01/03/12 0550 01/02/12 1752 01/02/12 0415 01/01/12 0356 12/31/11 1220  WBC  39.3* 38.6* 38.8* 20.4* 15.0*  PROCALCITON 2.69 -- -- -- 2.93   Cultures: Blood - 9/11>>>ng Urine - 9/8 - Multiple bacterial morphotypes  Antibiotics: Zosyn 9/11>>> Vanc 9/12>>>9/15  A: SIRS vs sepsis     UTI - positive for leuks, and nitrites, cultures likely contaminated     Probable PNA  P:  Follow WBC, c diff pcr      Urine Culture      Continue empiric ABX r/t possible PNA   ENDOCRINE CBG (last 3)   Basename 01/03/12 0423 01/03/12 0001 01/02/12 1956  GLUCAP 115* 109* 109*   A: No issues currently   P: CBG Q4H r/t high risk for hyperglycemia  NEUROLOGIC A: Alcohol withdrawal/ acute encephalopathy      Pain r/t acute pancreatitis     Chronic narcotic denpendence  P: Fentanyl.     CIWA protocol ordered Q4H.     Versed and fentanyl drips - add po ativan when able , to decrease drips.   BEST PRACTICE / DISPOSITION Level of Care:  ICU Primary Service:  PCCM Consultants: CCM Code Status:  Full Diet:  TFs DVT Px: Lovenox, SCDs GI Px:  Pepcid Skin Integrity:  Inact Social / Family:  Unavalible  She cannot give consent for procedure.Will make attempts to reach son but apparently he is not involved with her care. Needs paracentesis as diagnostic procedure given fever & rising WC, low risk procedure , no coagulopathy - will provide emergent consent if unable to reach son or if he is unwilling to participate.  Care during the described time interval was provided by me and/or other providers on the  critical care team.  I have reviewed this patient's available data, including medical history, events of note, physical examination and test results as part of my evaluation CO-ordinated with IR CC time x  40 m  Cyril Mourning MD. Tonny Bollman. [place] [facility] Pager [phone] If no response call [phone]

## 2012-01-03 NOTE — Progress Notes (Signed)
Name: [name] MRN: [id number] DOB: [birth year]  ELECTRONIC ICU PHYSICIAN NOTE  Problem:  Hypotension  Intervention:  Propofol d/c'd.  NS bolus 1000 mL.  Neo-Synephrine gtt, goal MAP 60-65.  RT to place A-line.  Orlean Bradford, M.D. Pulmonary and Critical Care Medicine [facility] Cell: [phone] Pager: [phone]  01/03/2012, 8:06 PM

## 2012-01-03 NOTE — Consult Note (Signed)
Reason for Consult:abdominal distention with green ascites, rising white count and sepsis Referring Physician: [facility]   [name] is an 56 y.o. female.  HPI: Admitted with Etoh pancreatitis.  Recent deterioration with rising WBC and sepsis.  For exploratory laparotomy  Past Medical History  Diagnosis Date  . GERD (gastroesophageal reflux disease)   . Rosacea   . Elevated liver function tests   . Wears glasses   . Chronic diarrhea   . Alcohol abuse     H/o withdrawal   . PONV (postoperative nausea and vomiting)   . Pancreatitis     Past Surgical History  Procedure Date  . Cholecystectomy   . Shoulder surgery [id number]  . Diagnostic mammogram 2008  . Tubal ligation   . Colonoscopy Never  . Esophagogastroduodenoscopy 03/21/2011    Procedure: ESOPHAGOGASTRODUODENOSCOPY (EGD);  Surgeon: Yancey Flemings, MD;  Location: [facility] ENDOSCOPY;  Service: Endoscopy;  Laterality: N/A;  Patient may need to be done at bedside tomorrow depending on status  . Ercp 10/06/2011    Procedure: ENDOSCOPIC RETROGRADE CHOLANGIOPANCREATOGRAPHY (ERCP);  Surgeon: Theda Belfast, MD;  Location: [facility] ENDOSCOPY;  Service: Endoscopy;  Laterality: N/A;    Family History  Problem Relation Age of Onset  . Stroke Mother   . Heart disease Father   . Heart failure Father   . Heart disease Sister     Social History:  reports that she quit smoking about 33 years ago. She has never used smokeless tobacco. She reports that she drinks about 1.5 - 2 ounces of alcohol per week. She reports that she does not use illicit drugs.  Allergies:  Allergies  Allergen Reactions  . Ciprofloxacin Nausea Only    REACTION: nausea    Medications: I have reviewed the patient's current medications.  Results for orders placed during the hospital encounter of 12/26/11 (from the past 48 hour(s))  GLUCOSE, CAPILLARY     Status: Normal   Collection Time   01/01/12 11:31 AM      Component Value Range Comment   Glucose-Capillary 99   70 - 99 mg/dL    Comment 1 Documented in Chart      Comment 2 Notify RN     GLUCOSE, CAPILLARY     Status: Abnormal   Collection Time   01/01/12  4:22 PM      Component Value Range Comment   Glucose-Capillary 105 (*) 70 - 99 mg/dL    Comment 1 Documented in Chart      Comment 2 Notify RN     GLUCOSE, CAPILLARY     Status: Abnormal   Collection Time   01/01/12  7:40 PM      Component Value Range Comment   Glucose-Capillary 117 (*) 70 - 99 mg/dL    Comment 1 Documented in Chart      Comment 2 Notify RN     CULTURE, BLOOD (ROUTINE X 2)     Status: Normal (Preliminary result)   Collection Time   01/01/12 10:45 PM      Component Value Range Comment   Specimen Description LEFT ANTECUBITAL      Special Requests BOTTLES DRAWN AEROBIC AND ANAEROBIC 3 CC EA      Culture  Setup Time 01/02/2012 03:49      Culture        Value:        BLOOD CULTURE RECEIVED NO GROWTH TO DATE CULTURE WILL BE HELD FOR 5 DAYS BEFORE ISSUING A FINAL NEGATIVE REPORT   Report  Status PENDING     FUNGUS CULTURE, BLOOD     Status: Normal (Preliminary result)   Collection Time   01/01/12 10:45 PM      Component Value Range Comment   Specimen Description LEFT ANTECUBITAL      Special Requests BOTTLES DRAWN AEROBIC ONLY 3 CC EA      Culture NO FUNGUS ISOLATED;CULTURE IN PROGRESS FOR 7 DAYS      Report Status PENDING     CULTURE, BLOOD (ROUTINE X 2)     Status: Normal (Preliminary result)   Collection Time   01/01/12 10:55 PM      Component Value Range Comment   Specimen Description RIGHT ANTECUBITAL      Special Requests BOTTLES DRAWN AEROBIC AND ANAEROBIC 5 CC EA      Culture  Setup Time 01/02/2012 03:49      Culture        Value:        BLOOD CULTURE RECEIVED NO GROWTH TO DATE CULTURE WILL BE HELD FOR 5 DAYS BEFORE ISSUING A FINAL NEGATIVE REPORT   Report Status PENDING     GLUCOSE, CAPILLARY     Status: Abnormal   Collection Time   01/01/12 11:28 PM      Component Value Range Comment   Glucose-Capillary 126 (*)  70 - 99 mg/dL    Comment 1 Documented in Chart      Comment 2 Notify RN     CULTURE, RESPIRATORY     Status: Normal (Preliminary result)   Collection Time   01/01/12 11:35 PM      Component Value Range Comment   Specimen Description TRACHEAL ASPIRATE      Special Requests Normal      Gram Stain        Value: RARE WBC PRESENT,BOTH PMN AND MONONUCLEAR     RARE SQUAMOUS EPITHELIAL CELLS PRESENT     RARE GRAM POSITIVE COCCI IN PAIRS   Culture PENDING      Report Status PENDING     GLUCOSE, CAPILLARY     Status: Abnormal   Collection Time   01/02/12  3:03 AM      Component Value Range Comment   Glucose-Capillary 133 (*) 70 - 99 mg/dL    Comment 1 Documented in Chart      Comment 2 Notify RN     BLOOD GAS, ARTERIAL     Status: Abnormal   Collection Time   01/02/12  3:30 AM      Component Value Range Comment   FIO2 0.50      Delivery systems VENTILATOR      Mode PRESSURE REGULATED VOLUME CONTROL      VT 360      Rate 26      Peep/cpap 8.0      pH, Arterial 7.409  7.350 - 7.450    pCO2 arterial 31.3 (*) 35.0 - 45.0 mmHg    pO2, Arterial 88.5  80.0 - 100.0 mmHg    Bicarbonate 18.9 (*) 20.0 - 24.0 mEq/L    TCO2 17.0  0 - 100 mmol/L    Acid-base deficit 3.7 (*) 0.0 - 2.0 mmol/L    O2 Saturation 95.5      Patient temperature 102.5      Collection site LEFT RADIAL      Drawn by [id number]      Sample type ARTERIAL      Allens test (pass/fail) PASS  PASS   COMPREHENSIVE METABOLIC PANEL  Status: Abnormal   Collection Time   01/02/12  4:15 AM      Component Value Range Comment   Sodium 138  135 - 145 mEq/L    Potassium 4.8  3.5 - 5.1 mEq/L    Chloride 107  96 - 112 mEq/L    CO2 20  19 - 32 mEq/L    Glucose, Bld 136 (*) 70 - 99 mg/dL    BUN 34 (*) 6 - 23 mg/dL    Creatinine, Ser 4.54  0.50 - 1.10 mg/dL    Calcium 7.9 (*) 8.4 - 10.5 mg/dL    Total Protein 5.9 (*) 6.0 - 8.3 g/dL    Albumin 1.6 (*) 3.5 - 5.2 g/dL    AST 45 (*) 0 - 37 U/L    ALT 10  0 - 35 U/L    Alkaline  Phosphatase 122 (*) 39 - 117 U/L    Total Bilirubin 0.4  0.3 - 1.2 mg/dL    GFR calc non Af Amer >90  >90 mL/min    GFR calc Af Amer >90  >90 mL/min   CBC     Status: Abnormal   Collection Time   01/02/12  4:15 AM      Component Value Range Comment   WBC 38.8 (*) 4.0 - 10.5 K/uL    RBC 3.85 (*) 3.87 - 5.11 MIL/uL    Hemoglobin 10.5 (*) 12.0 - 15.0 g/dL    HCT 09.8 (*) 11.9 - 46.0 %    MCV 85.5  78.0 - 100.0 fL    MCH 27.3  26.0 - 34.0 pg    MCHC 31.9  30.0 - 36.0 g/dL    RDW 14.7 (*) 82.9 - 15.5 %    Platelets 400  150 - 400 K/uL   MAGNESIUM     Status: Abnormal   Collection Time   01/02/12  4:15 AM      Component Value Range Comment   Magnesium 2.6 (*) 1.5 - 2.5 mg/dL   PHOSPHORUS     Status: Normal   Collection Time   01/02/12  4:15 AM      Component Value Range Comment   Phosphorus 4.2  2.3 - 4.6 mg/dL   GLUCOSE, CAPILLARY     Status: Abnormal   Collection Time   01/02/12  8:04 AM      Component Value Range Comment   Glucose-Capillary 162 (*) 70 - 99 mg/dL   GLUCOSE, CAPILLARY     Status: Abnormal   Collection Time   01/02/12  1:01 PM      Component Value Range Comment   Glucose-Capillary 128 (*) 70 - 99 mg/dL    Comment 1 Documented in Chart      Comment 2 Notify RN     GLUCOSE, CAPILLARY     Status: Abnormal   Collection Time   01/02/12  4:23 PM      Component Value Range Comment   Glucose-Capillary 114 (*) 70 - 99 mg/dL    Comment 1 Documented in Chart      Comment 2 Notify RN     OCCULT BLOOD X 1 CARD TO LAB, STOOL     Status: Normal   Collection Time   01/02/12  5:50 PM      Component Value Range Comment   Fecal Occult Bld POSITIVE     CBC     Status: Abnormal   Collection Time   01/02/12  5:52 PM      Component  Value Range Comment   WBC 38.6 (*) 4.0 - 10.5 K/uL    RBC 3.43 (*) 3.87 - 5.11 MIL/uL    Hemoglobin 9.4 (*) 12.0 - 15.0 g/dL    HCT 62.1 (*) 30.8 - 46.0 %    MCV 84.8  78.0 - 100.0 fL    MCH 27.4  26.0 - 34.0 pg    MCHC 32.3  30.0 - 36.0 g/dL     RDW 65.7 (*) 84.6 - 15.5 %    Platelets 417 (*) 150 - 400 K/uL   COMPREHENSIVE METABOLIC PANEL     Status: Abnormal   Collection Time   01/02/12  5:52 PM      Component Value Range Comment   Sodium 138  135 - 145 mEq/L    Potassium 3.7  3.5 - 5.1 mEq/L    Chloride 107  96 - 112 mEq/L    CO2 23  19 - 32 mEq/L    Glucose, Bld 116 (*) 70 - 99 mg/dL    BUN 32 (*) 6 - 23 mg/dL    Creatinine, Ser 9.62  0.50 - 1.10 mg/dL    Calcium 7.7 (*) 8.4 - 10.5 mg/dL    Total Protein 5.5 (*) 6.0 - 8.3 g/dL    Albumin 1.5 (*) 3.5 - 5.2 g/dL    AST 29  0 - 37 U/L    ALT 9  0 - 35 U/L    Alkaline Phosphatase 95  39 - 117 U/L    Total Bilirubin 0.5  0.3 - 1.2 mg/dL    GFR calc non Af Amer >90  >90 mL/min    GFR calc Af Amer >90  >90 mL/min   LACTIC ACID, PLASMA     Status: Normal   Collection Time   01/02/12  5:52 PM      Component Value Range Comment   Lactic Acid, Venous 1.3  0.5 - 2.2 mmol/L   DIC (DISSEMINATED INTRAVASCULAR COAGULATION) PANEL     Status: Abnormal   Collection Time   01/02/12  5:52 PM      Component Value Range Comment   Prothrombin Time 16.5 (*) 11.6 - 15.2 seconds    INR 1.31  0.00 - 1.49    aPTT 34  24 - 37 seconds    Fibrinogen 705 (*) 204 - 475 mg/dL    D-Dimer, Quant 9.52 (*) 0.00 - 0.48 ug/mL-FEU    Platelets 417 (*) 150 - 400 K/uL    Smear Review NO SCHISTOCYTES SEEN     LIPASE, BLOOD     Status: Normal   Collection Time   01/02/12  5:52 PM      Component Value Range Comment   Lipase 41  11 - 59 U/L   GLUCOSE, CAPILLARY     Status: Abnormal   Collection Time   01/02/12  7:56 PM      Component Value Range Comment   Glucose-Capillary 109 (*) 70 - 99 mg/dL    Comment 1 Documented in Chart      Comment 2 Notify RN     GLUCOSE, CAPILLARY     Status: Abnormal   Collection Time   01/03/12 12:01 AM      Component Value Range Comment   Glucose-Capillary 109 (*) 70 - 99 mg/dL    Comment 1 Documented in Chart      Comment 2 Notify RN     BLOOD GAS, ARTERIAL     Status:  Abnormal   Collection Time  01/03/12  3:30 AM      Component Value Range Comment   FIO2 0.40      Delivery systems VENTILATOR      Mode PRESSURE REGULATED VOLUME CONTROL      VT 360      Rate 26      Peep/cpap 5.0      pH, Arterial 7.449  7.350 - 7.450    pCO2 arterial 29.3 (*) 35.0 - 45.0 mmHg    pO2, Arterial 59.7 (*) 80.0 - 100.0 mmHg    Bicarbonate 20.0  20.0 - 24.0 mEq/L    TCO2 18.2  0 - 100 mmol/L    Acid-base deficit 2.7 (*) 0.0 - 2.0 mmol/L    O2 Saturation 90.4      Patient temperature 98.6      Collection site LEFT RADIAL      Drawn by [id number]      Sample type ARTERIAL DRAW      Allens test (pass/fail) PASS  PASS   GLUCOSE, CAPILLARY     Status: Abnormal   Collection Time   01/03/12  4:23 AM      Component Value Range Comment   Glucose-Capillary 115 (*) 70 - 99 mg/dL    Comment 1 Documented in Chart      Comment 2 Notify RN     COMPREHENSIVE METABOLIC PANEL     Status: Abnormal   Collection Time   01/03/12  5:50 AM      Component Value Range Comment   Sodium 141  135 - 145 mEq/L    Potassium 4.0  3.5 - 5.1 mEq/L    Chloride 109  96 - 112 mEq/L    CO2 21  19 - 32 mEq/L    Glucose, Bld 136 (*) 70 - 99 mg/dL    BUN 32 (*) 6 - 23 mg/dL    Creatinine, Ser 9.14  0.50 - 1.10 mg/dL    Calcium 7.6 (*) 8.4 - 10.5 mg/dL    Total Protein 6.0  6.0 - 8.3 g/dL    Albumin 1.6 (*) 3.5 - 5.2 g/dL    AST 46 (*) 0 - 37 U/L    ALT 9  0 - 35 U/L    Alkaline Phosphatase 137 (*) 39 - 117 U/L    Total Bilirubin 0.4  0.3 - 1.2 mg/dL    GFR calc non Af Amer >90  >90 mL/min    GFR calc Af Amer >90  >90 mL/min   CBC     Status: Abnormal   Collection Time   01/03/12  5:50 AM      Component Value Range Comment   WBC 39.3 (*) 4.0 - 10.5 K/uL    RBC 3.42 (*) 3.87 - 5.11 MIL/uL    Hemoglobin 9.6 (*) 12.0 - 15.0 g/dL    HCT 78.2 (*) 95.6 - 46.0 %    MCV 85.7  78.0 - 100.0 fL    MCH 28.1  26.0 - 34.0 pg    MCHC 32.8  30.0 - 36.0 g/dL    RDW 21.3 (*) 08.6 - 15.5 %    Platelets 566 (*)  150 - 400 K/uL   LIPASE, BLOOD     Status: Normal   Collection Time   01/03/12  5:50 AM      Component Value Range Comment   Lipase 49  11 - 59 U/L   PROCALCITONIN     Status: Normal   Collection Time   01/03/12  5:50  AM      Component Value Range Comment   Procalcitonin 2.69       Ct Abdomen Pelvis W Contrast  01/02/2012  **ADDENDUM** CREATED: 01/02/2012 17:36:05  I spoke with the critical care physician by telephone, and in further review of the CT images, the gas bubbles which I initially thought were in the pelvic ascites I believe are actually within a completely collapsed urinary bladder which has been displaced anteriorly by the ascites in the pelvis.  Therefore, I do not believe this represents free intraperitoneal air.  **END ADDENDUM** SIGNED BY: Arnell Sieving, M.D.   01/02/2012  *RADIOLOGY REPORT*  Clinical Data: Follow up pancreatitis.  Epigastric abdominal pain. Leukocytosis with white blood cell count 38,800.  CT ABDOMEN AND PELVIS WITH CONTRAST  Technique:  Multidetector CT imaging of the abdomen and pelvis was performed following the standard protocol during bolus administration of intravenous contrast.  Contrast: OMNIPAQUE IOHEXOL 300 MG/ML. Oral contrast was also administered.  Comparison: CT abdomen and pelvis 12/30/2011, 12/27/2011, 09/27/2011 03/18/2011, 11/27/2010.  Findings: Interval increase in the amount of abdominal and pelvic ascites since the examination 3 days ago, now quite large.  Gas bubbles within the ascitic fluid in the pelvis.  Enhancement of the peritoneum and omentum, with mild thickening.  The fluid has Hounsfield measurement of near 0, indicating simple fluid.  Persistent edema/inflammation surrounding the head of the pancreas, with an approximate 1.8 x 2.6 cm pseudocyst at the junction of the head and body; this is unchanged from the examination 3 days ago and has increased slightly since the June, 2013 examination. Developing pseudocyst in the  pancreatic head measuring approximately 1.3 x 2.6 cm, not present on the June, 2013 examination.  Gallbladder surgically absent.  No unexpected biliary ductal dilation.  Mild diffuse hepatic steatosis without focal hepatic parenchymal abnormality.  Normal appearing spleen, adrenal glands, and kidneys.  Moderate to severe aorto-iliac atherosclerosis without aneurysm.  Large hiatal hernia.  Nasogastric tube looped within the intrathoracic portion of the stomach.  Oral contrast material in the distal esophagus.  Upper normal colon or small bowel loops without obstruction.  Gaseous distention of the transverse colon. Sigmoid colon diverticulosis without evidence of acute diverticulitis.  Uterus and ovaries unremarkable by CT.  Phleboliths in both sides of the low pelvis.  Bone window images demonstrate degenerative changes involving the lower thoracic and lumbar spine. Consolidation in the deep posterior lower lobes bilaterally with air bronchograms.  Small left pleural effusion.  Diffuse body wall edema.  Heart size upper normal.  IMPRESSION:  1. Since the CT 3 days ago, interval increase in the ascites diffusely throughout the abdomen and pelvis.  Gas bubbles are present within the ascitic fluid in the pelvis, presumably from recent tap.  2. Enhancement and mild thickening of the peritoneum and omentum, consistent with inflammation.  The ascitic fluid has a Hounsfield measurement near 0, indicating simple fluid (not hemorrhagic). 3.  Probable acute pancreatitis involving the head of the pancreas. Stable pseudocyst at the junction of the head and body.  Developing pseudocyst in the head of the pancreas. 4.  Diffuse hepatic steatosis without focal hepatic parenchymal abnormality. 5.  Transverse colon ileus. 6.  Sigmoid colon diverticulosis without evidence of acute diverticulitis. 7.  Large hiatal hernia.  The nasogastric tube is looped within the intrathoracic stomach.  Oral contrast material in the distal esophagus is  consistent with reflux. 8.  Bilateral lower lobe atelectasis and/or pneumonia.  Small left pleural effusion. 9.  Age  advanced aorto-iliac atherosclerosis. 10.  Anasarca.   Original Report Authenticated By: Arnell Sieving, M.D.    Dg Chest Port 1 View  01/03/2012  *RADIOLOGY REPORT*  Clinical Data: Acute respiratory distress syndrome, endotracheal tube placement.  PORTABLE CHEST - 1 VIEW  Comparison: January 02, 2012.  Findings: Cardiomediastinal silhouette appears normal. Endotracheal tube is in grossly good position with tip several centimeters above the carina.  Nasogastric tube tip is coiled within proximal stomach.  Right internal jugular catheter line is unchanged in position. Left basilar opacity is unchanged compared to prior exam.  No change is noted in mild bilateral pleural effusions.  IMPRESSION: No change compared to prior exam.   Original Report Authenticated By: Venita Sheffield., M.D.    Dg Chest Port 1 View  01/02/2012  *RADIOLOGY REPORT*  Clinical Data: Endotracheal tube  PORTABLE CHEST - 1 VIEW  Comparison: Yesterday  Findings: Endotracheal tube tip 30.0 cm from the carina.  Lungs markedly under aerated with increasing bibasilar atelectasis and/or airspace disease.  Pleural effusions suspected.  Right internal jugular vein center venous catheter stable.  No pneumothorax.  IMPRESSION: Increasing bibasilar atelectasis verses airspace disease. Otherwise, no significant change.   Original Report Authenticated By: Donavan Burnet, M.D.     Review of Systems  Constitutional: Positive for fever.  Respiratory:       On ventilator  Gastrointestinal: Positive for heartburn, abdominal pain, blood in stool and melena.       Distended with pea soup peritoneal fluid  Genitourinary:       Foley  Psychiatric/Behavioral:       Repeat alcoholic pancreatitis   Blood pressure 150/80, pulse 115, temperature 102 F (38.9 C), temperature source Oral, resp. rate 30, height 5\' 6"  (1.676 m),  weight 176 lb 2.4 oz (79.9 kg), last menstrual period 12/08/2006, SpO2 93.00%. Physical Exam  Constitutional: She appears distressed.  HENT:  Head: Normocephalic and atraumatic.  Eyes: No scleral icterus (paralyzed on ventilator).  Cardiovascular:       tachycardia  Respiratory: She is in respiratory distress.       ventilated  GI: She exhibits distension and ascites.  Neurological:       Chemically obtunded    Assessment/Plan: Clinical deterioration in EtOH pancreatitis now with rising WBC and findings suggesting GI perforation.  Plan exploratory laparotomy  Raaga Maeder B 01/03/2012, 11:06 AM

## 2012-01-03 NOTE — Procedures (Signed)
Successful US guided paracentesis from LLQ.  Yielded 1.5L of turbid brown-green fluid, with free floating debris.  No immediate complications.  Pt tolerated well.   Specimen was sent for labs.  Brayton El PA-C 01/03/2012 10:15 AM

## 2012-01-04 ENCOUNTER — Inpatient Hospital Stay (HOSPITAL_COMMUNITY): Payer: MEDICAID

## 2012-01-04 ENCOUNTER — Encounter (HOSPITAL_COMMUNITY): Payer: Self-pay | Admitting: Surgery

## 2012-01-04 DIAGNOSIS — K863 Pseudocyst of pancreas: Secondary | ICD-10-CM

## 2012-01-04 DIAGNOSIS — R579 Shock, unspecified: Secondary | ICD-10-CM

## 2012-01-04 LAB — BLOOD GAS, ARTERIAL
Acid-base deficit: 5.2 mmol/L — ABNORMAL HIGH (ref 0.0–2.0)
Bicarbonate: 18.5 mEq/L — ABNORMAL LOW (ref 20.0–24.0)
FIO2: 0.4 %
TCO2: 17.4 mmol/L (ref 0–100)
pCO2 arterial: 31.1 mmHg — ABNORMAL LOW (ref 35.0–45.0)
pO2, Arterial: 65.5 mmHg — ABNORMAL LOW (ref 80.0–100.0)

## 2012-01-04 LAB — CULTURE, RESPIRATORY W GRAM STAIN: Special Requests: NORMAL

## 2012-01-04 LAB — GLUCOSE, CAPILLARY
Glucose-Capillary: 117 mg/dL — ABNORMAL HIGH (ref 70–99)
Glucose-Capillary: 127 mg/dL — ABNORMAL HIGH (ref 70–99)
Glucose-Capillary: 169 mg/dL — ABNORMAL HIGH (ref 70–99)
Glucose-Capillary: 185 mg/dL — ABNORMAL HIGH (ref 70–99)

## 2012-01-04 LAB — CBC
MCH: 27.6 pg (ref 26.0–34.0)
MCHC: 31.8 g/dL (ref 30.0–36.0)
Platelets: 422 10*3/uL — ABNORMAL HIGH (ref 150–400)

## 2012-01-04 LAB — BASIC METABOLIC PANEL
BUN: 49 mg/dL — ABNORMAL HIGH (ref 6–23)
Calcium: 6.6 mg/dL — ABNORMAL LOW (ref 8.4–10.5)
GFR calc non Af Amer: 37 mL/min — ABNORMAL LOW (ref >90)
Glucose, Bld: 207 mg/dL — ABNORMAL HIGH (ref 70–99)
Sodium: 141 mEq/L (ref 135–145)

## 2012-01-04 MED ORDER — ALBUMIN HUMAN 25 % IV SOLN
25.0000 g | Freq: Once | INTRAVENOUS | Status: AC
  Administered 2012-01-04: 25 g via INTRAVENOUS
  Filled 2012-01-04: qty 100

## 2012-01-04 MED ORDER — FAT EMULSION 20 % IV EMUL
240.0000 mL | INTRAVENOUS | Status: AC
  Administered 2012-01-04: 240 mL via INTRAVENOUS
  Filled 2012-01-04: qty 250

## 2012-01-04 MED ORDER — FENTANYL BOLUS VIA INFUSION
50.0000 ug | Freq: Four times a day (QID) | INTRAVENOUS | Status: DC | PRN
  Filled 2012-01-04: qty 100

## 2012-01-04 MED ORDER — SODIUM CHLORIDE 0.9 % IV SOLN
2.0000 mg/h | INTRAVENOUS | Status: DC
  Administered 2012-01-04: 6 mg/h via INTRAVENOUS
  Administered 2012-01-04: 2 mg/h via INTRAVENOUS
  Filled 2012-01-04 (×3): qty 10

## 2012-01-04 MED ORDER — ENOXAPARIN SODIUM 30 MG/0.3ML ~~LOC~~ SOLN
30.0000 mg | Freq: Every day | SUBCUTANEOUS | Status: DC
  Administered 2012-01-04: 30 mg via SUBCUTANEOUS
  Filled 2012-01-04 (×2): qty 0.3

## 2012-01-04 MED ORDER — SODIUM CHLORIDE 0.9 % IV SOLN
50.0000 ug/h | INTRAVENOUS | Status: DC
  Administered 2012-01-04: 50 ug/h via INTRAVENOUS
  Administered 2012-01-04: 150 ug/h via INTRAVENOUS
  Administered 2012-01-05: 100 ug/h via INTRAVENOUS
  Filled 2012-01-04 (×4): qty 50

## 2012-01-04 MED ORDER — MIDAZOLAM BOLUS VIA INFUSION
1.0000 mg | INTRAVENOUS | Status: DC | PRN
  Filled 2012-01-04: qty 2

## 2012-01-04 MED ORDER — ZINC TRACE METAL 1 MG/ML IV SOLN
INTRAVENOUS | Status: AC
  Administered 2012-01-04: 18:00:00 via INTRAVENOUS
  Filled 2012-01-04: qty 1000

## 2012-01-04 NOTE — Progress Notes (Signed)
[id number]/Almedia Cordell, RN, BSN, CCM: CHART REVIEWED AND UPDATED. NO DISCHARGE NEEDS PRESENT AT THIS TIME. CASE MANAGEMENT [phone]  

## 2012-01-04 NOTE — Progress Notes (Signed)
1 Day Post-Op  Subjective: POD#1 Remains intubated, sedated, and on pressors  Objective: Vital signs in last 24 hours: Temp:  [97.9 F (36.6 C)-103.2 F (39.6 C)] 103.2 F (39.6 C) (09/16 0800) Pulse Rate:  [35-130] 118  (09/16 0800) Resp:  [14-41] 20  (09/16 0800) BP: (46-221)/(12-102) 109/71 mmHg (09/16 0800) SpO2:  [79 %-100 %] 94 % (09/16 0800) FiO2 (%):  [40 %] 40 % (09/16 0456) Weight:  [175 lb 7.8 oz (79.6 kg)] 175 lb 7.8 oz (79.6 kg) (09/16 0448) Last BM Date: 01/03/12  Intake/Output from previous day: 09/15 0701 - 09/16 0700 In: 8837.1 [I.V.:6039.6; IV Piggyback:2797.5] Out: 1625 [Urine:535; Emesis/NG output:5; Drains:885; Blood:200] Intake/Output this shift: Total I/O In: 259.3 [I.V.:159.3; IV Piggyback:100] Out: 18 [Urine:18]  Intubated,sedated Abdomen distended, drains with greenish tint, thin fluid Dressing dry  Lab Results:   [facility] 01/04/12 0429 01/03/12 0550  WBC 25.0* 39.3*  HGB 9.3* 9.6*  HCT 29.2* 29.3*  PLT 422* 566*   BMET  Basename 01/04/12 0429 01/03/12 0550  NA 141 141  K 4.2 4.0  CL 109 109  CO2 21 21  GLUCOSE 207* 136*  BUN 49* 32*  CREATININE 1.52* 0.77  CALCIUM 6.6* 7.6*   PT/INR  Basename 01/02/12 1752  LABPROT 16.5*  INR 1.31   ABG  Basename 01/04/12 0458 01/03/12 2202  PHART 7.393 7.366  HCO3 18.5* 15.8*    Studies/Results: Ct Abdomen Pelvis W Contrast  01/02/2012  **ADDENDUM** CREATED: 01/02/2012 17:36:05  I spoke with the critical care physician by telephone, and in further review of the CT images, the gas bubbles which I initially thought were in the pelvic ascites I believe are actually within a completely collapsed urinary bladder which has been displaced anteriorly by the ascites in the pelvis.  Therefore, I do not believe this represents free intraperitoneal air.  **END ADDENDUM** SIGNED BY: Arnell Sieving, M.D.   01/02/2012  *RADIOLOGY REPORT*  Clinical Data: Follow up pancreatitis.  Epigastric abdominal  pain. Leukocytosis with white blood cell count 38,800.  CT ABDOMEN AND PELVIS WITH CONTRAST  Technique:  Multidetector CT imaging of the abdomen and pelvis was performed following the standard protocol during bolus administration of intravenous contrast.  Contrast: OMNIPAQUE IOHEXOL 300 MG/ML. Oral contrast was also administered.  Comparison: CT abdomen and pelvis 12/30/2011, 12/27/2011, 09/27/2011 03/18/2011, 11/27/2010.  Findings: Interval increase in the amount of abdominal and pelvic ascites since the examination 3 days ago, now quite large.  Gas bubbles within the ascitic fluid in the pelvis.  Enhancement of the peritoneum and omentum, with mild thickening.  The fluid has Hounsfield measurement of near 0, indicating simple fluid.  Persistent edema/inflammation surrounding the head of the pancreas, with an approximate 1.8 x 2.6 cm pseudocyst at the junction of the head and body; this is unchanged from the examination 3 days ago and has increased slightly since the June, 2013 examination. Developing pseudocyst in the pancreatic head measuring approximately 1.3 x 2.6 cm, not present on the June, 2013 examination.  Gallbladder surgically absent.  No unexpected biliary ductal dilation.  Mild diffuse hepatic steatosis without focal hepatic parenchymal abnormality.  Normal appearing spleen, adrenal glands, and kidneys.  Moderate to severe aorto-iliac atherosclerosis without aneurysm.  Large hiatal hernia.  Nasogastric tube looped within the intrathoracic portion of the stomach.  Oral contrast material in the distal esophagus.  Upper normal colon or small bowel loops without obstruction.  Gaseous distention of the transverse colon. Sigmoid colon diverticulosis without evidence of acute  diverticulitis.  Uterus and ovaries unremarkable by CT.  Phleboliths in both sides of the low pelvis.  Bone window images demonstrate degenerative changes involving the lower thoracic and lumbar spine. Consolidation in the deep  posterior lower lobes bilaterally with air bronchograms.  Small left pleural effusion.  Diffuse body wall edema.  Heart size upper normal.  IMPRESSION:  1. Since the CT 3 days ago, interval increase in the ascites diffusely throughout the abdomen and pelvis.  Gas bubbles are present within the ascitic fluid in the pelvis, presumably from recent tap.  2. Enhancement and mild thickening of the peritoneum and omentum, consistent with inflammation.  The ascitic fluid has a Hounsfield measurement near 0, indicating simple fluid (not hemorrhagic). 3.  Probable acute pancreatitis involving the head of the pancreas. Stable pseudocyst at the junction of the head and body.  Developing pseudocyst in the head of the pancreas. 4.  Diffuse hepatic steatosis without focal hepatic parenchymal abnormality. 5.  Transverse colon ileus. 6.  Sigmoid colon diverticulosis without evidence of acute diverticulitis. 7.  Large hiatal hernia.  The nasogastric tube is looped within the intrathoracic stomach.  Oral contrast material in the distal esophagus is consistent with reflux. 8.  Bilateral lower lobe atelectasis and/or pneumonia.  Small left pleural effusion. 9.  Age advanced aorto-iliac atherosclerosis. 10.  Anasarca.   Original Report Authenticated By: Arnell Sieving, M.D.    US Paracentesis  01/03/2012  *RADIOLOGY REPORT*  Clinical Data: Sepsis, pancreatitis, possible additional intra- abdominal process, ascites  ULTRASOUND GUIDED PARACENTESIS  Comparison:  None  An ultrasound guided paracentesis was thoroughly discussed with the patient and questions answered.  The benefits, risks, alternatives and complications were also discussed.  The patient understands and wishes to proceed with the procedure.  Written consent was obtained.  Ultrasound was performed to localize and mark an adequate pocket of fluid in the left lower quadrant of the abdomen. The fluid did appear to have some debris evident.  The area was then prepped and  draped in the normal sterile fashion.  1% Lidocaine was used for local anesthesia.  Under ultrasound guidance a 19 gauge Yueh catheter was introduced.  Paracentesis was performed.  The catheter was removed and a dressing applied.  Complications:  None  Findings:  A total of approximately 1.5 liters of turbid brown- green fluid, with free floating debris was removed.  A fluid sample was sent for laboratory analysis.  IMPRESSION: Successful ultrasound guided paracentesis yielding 1.5 liters of ascites. However the fluid is concerning for acute intra-abdominal process. Findings discussed with attending/ordering physician.  Read BY Brayton El PA-C   Original Report Authenticated By: Donavan Burnet, M.D.    Dg Chest Port 1 View  01/04/2012  *RADIOLOGY REPORT*  Clinical Data: Increased heart rate.  Decreased oxygen saturation.  PORTABLE CHEST - 1 VIEW  Comparison: Plain film chest 01/03/2012.  Findings: Support tubes and lines are unchanged.  No pneumothorax is identified.  Discoid atelectasis in the lung bases is more notable on the right.  The lungs are otherwise clear.  IMPRESSION:  1.  Support apparatus in good position. 2.  Bibasilar atelectasis.   Original Report Authenticated By: Bernadene Bell. Maricela Curet, M.D.    Dg Chest Port 1 View  01/03/2012  *RADIOLOGY REPORT*  Clinical Data: Acute respiratory distress syndrome, endotracheal tube placement.  PORTABLE CHEST - 1 VIEW  Comparison: January 02, 2012.  Findings: Cardiomediastinal silhouette appears normal. Endotracheal tube is in grossly good position with tip several centimeters above  the carina.  Nasogastric tube tip is coiled within proximal stomach.  Right internal jugular catheter line is unchanged in position. Left basilar opacity is unchanged compared to prior exam.  No change is noted in mild bilateral pleural effusions.  IMPRESSION: No change compared to prior exam.   Original Report Authenticated By: Venita Sheffield., M.D.      Anti-infectives: Anti-infectives     Start     Dose/Rate Route Frequency Ordered Stop   01/03/12 1600   metroNIDAZOLE (FLAGYL) IVPB 500 mg        500 mg 100 mL/hr over 60 Minutes Intravenous Every 8 hours 01/03/12 1525     12/31/11 1100   vancomycin (VANCOCIN) 750 mg in sodium chloride 0.9 % 150 mL IVPB  Status:  Discontinued        750 mg 150 mL/hr over 60 Minutes Intravenous Every 12 hours 12/31/11 0958 01/03/12 0849   12/30/11 1000   piperacillin-tazobactam (ZOSYN) IVPB 3.375 g        3.375 g 12.5 mL/hr over 240 Minutes Intravenous Every 8 hours 12/30/11 0901            Assessment/Plan: s/p exp lap and drainage infected, ruptured pseudocyst    LOS: 9 days   Remains critically ill  Continue supportive measures, NG and bowel rest  Maurina Fawaz A 01/04/2012
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
## 2012-01-04 NOTE — Progress Notes (Signed)
PARENTERAL NUTRITION CONSULT NOTE - INITIAL  Pharmacy Consult for TNA Indication: Pancreatitis  Allergies  Allergen Reactions  . Ciprofloxacin Nausea Only    REACTION: nausea    Patient Measurements: Height: 5\' 6"  (167.6 cm) Weight: 175 lb 7.8 oz (79.6 kg) IBW/kg (Calculated) : 59.3    Vital Signs: Temp: 103.2 F (39.6 C) (09/16 0800) Temp src: Oral (09/16 0800) BP: 125/59 mmHg (09/16 1230) Pulse Rate: 109  (09/16 1230) Intake/Output from previous day: 09/15 0701 - 09/16 0700 In: 8837.1 [I.V.:6039.6; IV Piggyback:2797.5] Out: 1625 [Urine:535; Emesis/NG output:5; Drains:885; Blood:200] Intake/Output from this shift: Total I/O In: 756.5 [I.V.:531.5; IV Piggyback:225] Out: 395 [Urine:128; Drains:267]  Labs:  [name] 01/04/12 0429 01/03/12 0550 01/02/12 1752  WBC 25.0* 39.3* 38.6*  HGB 9.3* 9.6* 9.4*  HCT 29.2* 29.3* 29.1*  PLT 422* 566* 417*417*  APTT -- -- 34  INR -- -- 1.31     Basename 01/04/12 0429 01/03/12 0550 01/02/12 1752 01/02/12 0415  NA 141 141 138 --  K 4.2 4.0 3.7 --  CL 109 109 107 --  CO2 21 21 23  --  GLUCOSE 207* 136* 116* --  BUN 49* 32* 32* --  CREATININE 1.52* 0.77 0.77 --  LABCREA -- -- -- --  CREAT24HRUR -- -- -- --  CALCIUM 6.6* 7.6* 7.7* --  MG -- -- -- 2.6*  PHOS -- -- -- 4.2  PROT -- 6.0 5.5* 5.9*  ALBUMIN -- 1.6* 1.5* 1.6*  AST -- 46* 29 45*  ALT -- 9 9 10   ALKPHOS -- 137* 95 122*  BILITOT -- 0.4 0.5 0.4  BILIDIR -- -- -- --  IBILI -- -- -- --  PREALBUMIN -- -- -- --  TRIG -- -- -- --  CHOLHDL -- -- -- --  CHOL -- -- -- --   Estimated Creatinine Clearance: 44 ml/min (by C-G formula based on Cr of 1.52).    Basename 01/04/12 1157 01/04/12 0901 01/04/12 0405  GLUCAP 124* 153* 185*    Medical History: Past Medical History  Diagnosis Date  . GERD (gastroesophageal reflux disease)   . Rosacea   . Elevated liver function tests   . Wears glasses   . Chronic diarrhea   . Alcohol abuse     H/o withdrawal   . PONV  (postoperative nausea and vomiting)   . Pancreatitis     Medications:  Scheduled:    . albumin human  25 g Intravenous Once  . antiseptic oral rinse  15 mL Mouth Rinse QID  . chlorhexidine  15 mL Mouth Rinse BID  . enoxaparin (LOVENOX) injection  30 mg Subcutaneous Daily  . folic acid  1 mg Intravenous Daily  . insulin aspart  2-6 Units Subcutaneous Q4H  . metronidazole  500 mg Intravenous Q8H  . pantoprazole (PROTONIX) IV  40 mg Intravenous Q12H  . piperacillin-tazobactam (ZOSYN)  IV  3.375 g Intravenous Q8H  . sodium chloride  1,000 mL Intravenous Once  . sodium chloride  1,000 mL Intravenous Once  . sodium chloride  500 mL Intravenous Once  . thiamine  100 mg Intravenous Daily   Infusions:    . sodium chloride 20 mL/hr at 12/31/11 0508  . fat emulsion    . fentaNYL infusion INTRAVENOUS 50 mcg/hr (01/04/12 1216)  . midazolam (VERSED) infusion 6 mg/hr (01/04/12 0700)  . norepinephrine (LEVOPHED) Adult infusion 8 mcg/min (01/04/12 1253)  . TPN (CLINIMIX) +/- additives    . DISCONTD: fentaNYL infusion INTRAVENOUS Stopped (01/03/12 2145)  . DISCONTD:  midazolam (VERSED) infusion 4 mg/hr (01/03/12 1430)  . DISCONTD: phenylephrine (NEO-SYNEPHRINE) Adult infusion 60 mcg/min (01/03/12 2100)  . DISCONTD: phenylephrine (NEO-SYNEPHRINE) Adult infusion 20 mcg/min (01/04/12 0700)  . DISCONTD: propofol Stopped (01/03/12 1950)  . DISCONTD:  sodium bicarbonate infusion 1000 mL 100 mL/hr at 01/04/12 0548   PRN: acetaminophen, fentaNYL, midazolam, ondansetron (ZOFRAN) IV, DISCONTD: 0.9 % irrigation (POUR BTL), DISCONTD: fentaNYL, DISCONTD: midazolam  Assessment:   56 yo female admitted 9/7 w/ ETOH pancreatitis now s/p exploratory lap 9/15 with debridement of necrotic pancreas and drainage of upper abdomen  Pt was on Oxepa tube feeds pre-op, now vent pod1 and to start TNA per pharmacy today  Nutritional Goals:  1762 kcal, 86-95 grams protein  Current nutrition:  NPO  CBGs & Insulin  requirements past 24 hours:  124-185, 10 units used (ICU hyperglycemia protocol)  IVF:  NS @ 10-65ml/hr  Labs:   Electrolytes: wnl  Scr slightly elevated  LFTs wnl 9/15  TGs to be drawn in AM  Prealbumin to be drawn in AM  Plan:  Start Clinimix E 5/15 @ 40 ml/hr   Fat emulsion at 10 ml/hr (MWF only due to ongoing shortage).   TNA to contain standard multivitamins and available trace elements (MWF only due to ongoing shortage).   Continue current SSI.   TNA lab panels in am and q Mondays & Thursdays  Gwen Her PharmD  [phone] 01/04/2012 1:45 PM

## 2012-01-04 NOTE — Progress Notes (Signed)
Nutrition Follow-up  Intervention:  1. TNA per pharmacy. Recommend meet > 90% of estimated energy needs. Recommend diet advancements as medically able.  2. RD to follow for nutrition plan of care.   Assessment:   Patient remains intubated. Discussed patient in rounds. 9/14 TF held due to firm abdomen and sub glottic suctioning. Noted ascites on 9/14. Paracentesis 9/15, 1.5 L removed. Noted per MD pt to start TNA for bowel rest.  MVe: 10.9 TMax: 39.2    Diet Order:  NPO  Meds: Scheduled Meds:   . albumin human  25 g Intravenous Once  . antiseptic oral rinse  15 mL Mouth Rinse QID  . chlorhexidine  15 mL Mouth Rinse BID  . enoxaparin (LOVENOX) injection  30 mg Subcutaneous Daily  . folic acid  1 mg Intravenous Daily  . insulin aspart  2-6 Units Subcutaneous Q4H  . metronidazole  500 mg Intravenous Q8H  . pantoprazole (PROTONIX) IV  40 mg Intravenous Q12H  . piperacillin-tazobactam (ZOSYN)  IV  3.375 g Intravenous Q8H  . sodium chloride  1,000 mL Intravenous Once  . sodium chloride  1,000 mL Intravenous Once  . sodium chloride  500 mL Intravenous Once  . thiamine  100 mg Intravenous Daily   Continuous Infusions:   . sodium chloride 20 mL/hr at 12/31/11 0508  . fentaNYL infusion INTRAVENOUS 250 mcg/hr (01/04/12 0700)  . midazolam (VERSED) infusion 6 mg/hr (01/04/12 0700)  . norepinephrine (LEVOPHED) Adult infusion 15 mcg/min (01/04/12 0600)  . DISCONTD: fentaNYL infusion INTRAVENOUS Stopped (01/03/12 2145)  . DISCONTD: midazolam (VERSED) infusion 4 mg/hr (01/03/12 1430)  . DISCONTD: phenylephrine (NEO-SYNEPHRINE) Adult infusion 60 mcg/min (01/03/12 2100)  . DISCONTD: phenylephrine (NEO-SYNEPHRINE) Adult infusion 20 mcg/min (01/04/12 0700)  . DISCONTD: propofol Stopped (01/03/12 1950)  . DISCONTD:  sodium bicarbonate infusion 1000 mL 100 mL/hr at 01/04/12 0548   PRN Meds:.acetaminophen, fentaNYL, midazolam, ondansetron (ZOFRAN) IV, DISCONTD: 0.9 % irrigation (POUR BTL), DISCONTD:  fentaNYL, DISCONTD: midazolam  Labs:  CMP     Component Value Date/Time   NA 141 01/04/2012 0429   K 4.2 01/04/2012 0429   CL 109 01/04/2012 0429   CO2 21 01/04/2012 0429   GLUCOSE 207* 01/04/2012 0429   BUN 49* 01/04/2012 0429   CREATININE 1.52* 01/04/2012 0429   CALCIUM 6.6* 01/04/2012 0429   PROT 6.0 01/03/2012 0550   ALBUMIN 1.6* 01/03/2012 0550   AST 46* 01/03/2012 0550   ALT 9 01/03/2012 0550   ALKPHOS 137* 01/03/2012 0550   BILITOT 0.4 01/03/2012 0550   GFRNONAA 37* 01/04/2012 0429   GFRAA 43* 01/04/2012 0429     Intake/Output Summary (Last 24 hours) at 01/04/12 1028 Last data filed at 01/04/12 0900  Gross per 24 hour  Intake 8990.4 ml  Output   1583 ml  Net 7407.4 ml    Weight Status:  175 lb.. Weight stable   Re-estimated needs:  1762 kcal, 86-95 grams protein  Nutrition Dx:  Inadequate Oral Intake, Ongoing.   Goal:  1. TNA per pharmacy, meet > 90% of estimated energy needs.  2. Labs WNL.  3. Promote weight maintenance.  4. Positive tolerance of TF. -Discontinue.   Monitor:  TNA per pharmacy, weights, labs, vent status, I/O's, diet advancements.    Iven Finn [facility] [phone]

## 2012-01-04 NOTE — Progress Notes (Signed)
Name: ADRIANNAH STEINKAMP MRN: [id number] DOB: Jul 03, 1955    LOS: 9  Referring Provider:  [name] Reason for Referral:  Acute Respiratory Failure  PULMONARY / CRITICAL CARE MEDICINE  HPI:  Verdelle Valtierra is a 56 y.o. female who presented to [name] ER on 12/26/11 with complaints of epigastric pain (10/10), fever, chills, nausea, vomiting and confusion. The patient has a history of ETOH abuse, but reported that she has not been able to drink more than a few ounces of alcohol due to her pancreatitis. Her last drink was on 09/05. MRCP was done 10/06/2011 which showed dilated common bile duct with biliary sludge. She subsequently underwent an ERCP with sphincterotomy on 10/06/2011.  [facility] admitted the patient, she was fluid resuscitated and placed on prophylactic abx, alcohol withdrawal protocol, and pain management.  Initially the patient's lipase improved from 177 to 126, and her LFTs remained normal.  On 9/12  RRT was called for respiratory decline and lethargy, subsequently required mechanical ventilation & Exploratory laparotomy with debridement of necrotic pancreas and drainage of the upper abdomen.    Events Since Admission: 9/12 RRT and subsequent transfer to the ICU. 9/14 TFs held due to firm abdomen & sub glottic suctioning, WC higher 9/14 increased ascites, Persistent edema/inflammation surrounding the head of the pancreas, with an approximate 1.8 x 2.6 cm pseudocyst at the junction of the head and body; this is unchanged from the examination 3 days ago  and has increased slightly since the June, 2013 examination. Developing pseudocyst in the pancreatic head measuring approximately 1.3 x 2.6 cm, Large hiatal hernia. Nasogastric tube looped within the intrathoracic portion of the stomach. Oral contrast passes to rectum, Gaseous distention of the transverse colon.  9/14 doppler UE neg for DVT  9/15 1.5 L paracentesis - green brown fluid 9/15 Exploratory laparotomy with debridement of necrotic  pancreas and drainage of the upper abdomen      Current Status: Critically ill Post op events noted, requiring pressors now & bicarb drip Febrile Propofol started post op,then stopped due to hypotension Poor UO Agitated on wua, asynchronous with vent   Vital Signs: Temp:  [97.9 F (36.6 C)-103.2 F (39.6 C)] 103.2 F (39.6 C) (09/16 0800) Pulse Rate:  [35-130] 118  (09/16 0800) Resp:  [14-41] 20  (09/16 0800) BP: (46-221)/(12-102) 109/71 mmHg (09/16 0800) SpO2:  [79 %-100 %] 94 % (09/16 0800) FiO2 (%):  [40 %] 40 % (09/16 0456) Weight:  [79.6 kg (175 lb 7.8 oz)] 79.6 kg (175 lb 7.8 oz) (09/16 0448)  Physical Examination: General: chronically ill  appearance, sedated and intubated. HEENT: Supple neck, no JVD, no tracheal deviation present.  Head: Normocephalic Mouth/Throat: Intubated Eyes: Conjunctivae are normal. Pupils are equal, round, and reactive to light. No scleral icterus.   Cardiovascular: Tachycardic, regular rhythm, normal heart sounds, S1 S2 and intact distal pulses.  Pulmonary/Chest: Coarse BS diffusely.  Abdominal: Soft but rounded and distended.  Tender to deep palpation. No guarding. Hyperactive bowel sounds. Midline lap incision with 2 JP drains Musculoskeletal: 1+ edema and no tenderness.  Neurological: Sedated but moves all ext to command.   Principal Problem:  *Acute pancreatitis Active Problems:  Alcohol abuse  Epigastric abdominal pain  Leukocytosis  Hyponatremia  Acute respiratory failure  Shock  ARDS (adult respiratory distress syndrome)   ASSESSMENT AND PLAN  PULMONARY  Lab 01/04/12 0458 01/03/12 2202 01/03/12 1542 01/03/12 0330 01/02/12 0330  PHART 7.393 7.366 7.290* 7.449 7.409  PCO2ART 31.1* 28.2* 32.7* 29.3* 31.3*  PO2ART 65.5* 78.1*  76.5* 59.7* 88.5  HCO3 18.5* 15.8* 15.2* 20.0 18.9*  O2SAT 90.8 95.2 92.5 90.4 95.5    CXR: 9/16  bibasilar atelectasis  ETT 9/12>>>  A: 1)  Acute Hypoxic respiratory failure w/ respiratory  alkalosis,  in setting of bilateral pulmonary infiltrates - ARDS  2)  Probable PNA HCAP vs Aspiration.       P:   Ventilate on ARDS protocol - down to 40%/PEEP 5. No wean while on pressors   CARDIOVASCULAR  Lab 01/02/12 1752 12/31/11 1220 12/31/11 0650  TROPONINI -- <0.30 <0.30  LATICACIDVEN 1.3 1.4 --  PROBNP -- -- --   ECG:   Sinus Tachycardia at 105 Lines:  RIJ 9/13 >>  A: Septic shock     In setting of pancreatitis & ruptured pseudocyst P: Continuous telemetry  RENAL  Lab 01/04/12 0429 01/03/12 0550 01/02/12 1752 01/02/12 0415 01/01/12 0356 12/31/11 1220 12/30/11 0335  NA 141 141 138 138 137 -- --  K 4.2 4.0 -- -- -- -- --  CL 109 109 107 107 106 -- --  CO2 21 21 23 20 20  -- --  BUN 49* 32* 32* 34* 28* -- --  CREATININE 1.52* 0.77 0.77 0.77 0.68 -- --  CALCIUM 6.6* 7.6* 7.7* 7.9* 7.7* -- --  MG -- -- -- 2.6* 2.4 2.1 3.0*  PHOS -- -- -- 4.2 3.2 -- --   Intake/Output      09/15 0701 - 09/16 0700 09/16 0701 - 09/17 0700   I.V. (mL/kg) 6039.6 (75.9) 159.3 (2)   IV Piggyback 2797.5 100   Total Intake(mL/kg) 8837.1 (111) 259.3 (3.3)   Urine (mL/kg/hr) 535 (0.3) 18   Emesis/NG output 5    Drains 885    Stool 0    Blood 200    Total Output 1625 18   Net +7212.1 +241.3        Stool Occurrence      Foley:  intact  A: Acute renal failure - post op     Hyponatremia: resolved  P:  Strict I&O.        Chk fena, aim for CVP 10-12 range -use albumin to avoid 3rd spacing      Dc bicarb      Replace electrolytes as needed.       GASTROINTESTINAL  Lab 01/03/12 0550 01/02/12 1752 01/02/12 0415 01/01/12 0356 12/31/11 1220  AST 46* 29 45* 24 18  ALT 9 9 10 9 9   ALKPHOS 137* 95 122* 84 77  BILITOT 0.4 0.5 0.4 0.4 0.4  PROT 6.0 5.5* 5.9* 5.4* 5.4*  ALBUMIN 1.6* 1.5* 1.6* 1.6* 1.6*   Lipase     Component Value Date/Time   LIPASE 49 01/03/2012 0550   A:  Acute Pancreatitis - lipase levels  improved, liver function test normal       Protein calorie malnutrition        Guiaic pos P:  Bowel rest -NG to LIS     protonix  q 12h     Will need post pyloric panda eventually -      Start TNA       HEMATOLOGIC  Lab 01/04/12 0429 01/03/12 0550 01/02/12 1752 01/02/12 0415 01/01/12 0356  HGB 9.3* 9.6* 9.4* 10.5* 9.4*  HCT 29.2* 29.3* 29.1* 32.9* 28.9*  PLT 422* 566* 417*417* 400 299  INR -- -- 1.31 -- --  APTT -- -- 34 -- --   A:  Mild Anemia - likely hemodilution from fluid administration P:  Recheck  CBC      Transfuse for HGB less than 7 Resume sq lovenox for dvt proph   INFECTIOUS  Lab 01/04/12 0429 01/03/12 0550 01/02/12 1752 01/02/12 0415 01/01/12 0356 12/31/11 1220  WBC 25.0* 39.3* 38.6* 38.8* 20.4* --  PROCALCITON -- 2.69 -- -- -- 2.93   Cultures: Blood - 9/11>>>ng Urine - 9/8 - Multiple bacterial morphotypes c diff pcr 9/14 >> POS 9/15 ascites >>  Antibiotics: Zosyn 9/11>>> Vanc 9/12>>>9/15  A: Septic shock     UTI - positive for leuks, and nitrites, cultures likely contaminated     C diff colitis  P:   Continue empiric zosyn, low threshold to add fungal coverage       Flagyl   ENDOCRINE CBG (last 3)   Basename 01/04/12 0405 01/04/12 0016 01/03/12 2025  GLUCAP 185* 169* 102*   A: No issues currently   P: CBG Q4H r/t high risk for hyperglycemia  NEUROLOGIC A: Alcohol withdrawal/ acute encephalopathy      Pain r/t acute pancreatitis     Chronic narcotic denpendence  P: Versed and fentanyl drips    BEST PRACTICE / DISPOSITION Level of Care:  ICU Primary Service:  PCCM Consultants: CCM Code Status:  Full Diet:  TFs DVT Px: Lovenox, SCDs GI Px:  Pepcid Skin Integrity:  Inact Social / Family:  Unavalible   Care during the described time interval was provided by me and/or other providers on the critical care team.  I have reviewed this patient's available data, including medical history, events of note, physical examination and test results as part of my evaluation CO-ordinated with IR CC time x  40 m  Cyril Mourning MD. Tonny Bollman. [facility] Pager [phone] If no response call [phone]

## 2012-01-05 ENCOUNTER — Inpatient Hospital Stay (HOSPITAL_COMMUNITY): Payer: MEDICAID

## 2012-01-05 LAB — CULTURE, BLOOD (ROUTINE X 2): Culture: NO GROWTH

## 2012-01-05 LAB — SODIUM, URINE, RANDOM: Sodium, Ur: 17 mEq/L

## 2012-01-05 LAB — DIFFERENTIAL
Basophils Absolute: 0.2 10*3/uL — ABNORMAL HIGH (ref 0.0–0.1)
Eosinophils Relative: 0 % (ref 0–5)
Lymphocytes Relative: 11 % — ABNORMAL LOW (ref 12–46)
Lymphs Abs: 1.9 10*3/uL (ref 0.7–4.0)
Monocytes Relative: 2 % — ABNORMAL LOW (ref 3–12)
Neutrophils Relative %: 86 % — ABNORMAL HIGH (ref 43–77)

## 2012-01-05 LAB — COMPREHENSIVE METABOLIC PANEL
ALT: 1087 U/L — ABNORMAL HIGH (ref 0–35)
Albumin: 1.4 g/dL — ABNORMAL LOW (ref 3.5–5.2)
Alkaline Phosphatase: 81 U/L (ref 39–117)
BUN: 41 mg/dL — ABNORMAL HIGH (ref 6–23)
Chloride: 112 mEq/L (ref 96–112)
Potassium: 3 mEq/L — ABNORMAL LOW (ref 3.5–5.1)
Sodium: 143 mEq/L (ref 135–145)
Total Bilirubin: 0.4 mg/dL (ref 0.3–1.2)

## 2012-01-05 LAB — URINE CULTURE: Culture: NO GROWTH

## 2012-01-05 LAB — BLOOD GAS, ARTERIAL
Acid-base deficit: 1.1 mmol/L (ref 0.0–2.0)
Bicarbonate: 22 mEq/L (ref 20.0–24.0)
FIO2: 0.4 %
O2 Saturation: 95.3 %
pCO2 arterial: 31.1 mmHg — ABNORMAL LOW (ref 35.0–45.0)
pO2, Arterial: 74.8 mmHg — ABNORMAL LOW (ref 80.0–100.0)

## 2012-01-05 LAB — CREATININE, URINE, RANDOM: Creatinine, Urine: 69.9 mg/dL

## 2012-01-05 LAB — CHOLESTEROL, TOTAL: Cholesterol: 43 mg/dL (ref 0–200)

## 2012-01-05 LAB — GLUCOSE, CAPILLARY

## 2012-01-05 LAB — CBC
MCHC: 32.6 g/dL (ref 30.0–36.0)
RDW: 16.8 % — ABNORMAL HIGH (ref 11.5–15.5)

## 2012-01-05 LAB — TRIGLYCERIDES: Triglycerides: 131 mg/dL (ref <150)

## 2012-01-05 LAB — PREALBUMIN: Prealbumin: 2.3 mg/dL — ABNORMAL LOW (ref 17.0–34.0)

## 2012-01-05 MED ORDER — POTASSIUM CHLORIDE 10 MEQ/50ML IV SOLN
10.0000 meq | INTRAVENOUS | Status: AC
  Administered 2012-01-05 (×4): 10 meq via INTRAVENOUS
  Filled 2012-01-05 (×4): qty 50

## 2012-01-05 MED ORDER — MIDAZOLAM HCL 5 MG/ML IJ SOLN
1.0000 mg | INTRAMUSCULAR | Status: DC | PRN
  Administered 2012-01-05 (×2): 2 mg via INTRAVENOUS
  Filled 2012-01-05 (×2): qty 1

## 2012-01-05 MED ORDER — POTASSIUM CHLORIDE 10 MEQ/50ML IV SOLN
10.0000 meq | INTRAVENOUS | Status: AC
  Administered 2012-01-05 (×6): 10 meq via INTRAVENOUS
  Filled 2012-01-05 (×5): qty 50

## 2012-01-05 MED ORDER — CLINIMIX E/DEXTROSE (5/20) 5 % IV SOLN
INTRAVENOUS | Status: AC
  Administered 2012-01-05: 17:00:00 via INTRAVENOUS
  Filled 2012-01-05: qty 1000

## 2012-01-05 MED ORDER — POLYVINYL ALCOHOL 1.4 % OP SOLN
1.0000 [drp] | OPHTHALMIC | Status: DC | PRN
  Filled 2012-01-05: qty 15

## 2012-01-05 MED ORDER — POLYVINYL ALCOHOL 1.4 % OP SOLN
1.0000 [drp] | OPHTHALMIC | Status: DC | PRN
  Administered 2012-01-06: 1 [drp] via OPHTHALMIC
  Filled 2012-01-05: qty 15

## 2012-01-05 MED ORDER — IOHEXOL 180 MG/ML  SOLN: 25.0000 mL | Freq: Once | INTRAMUSCULAR | Status: AC | PRN

## 2012-01-05 NOTE — Progress Notes (Signed)
Pt weaned on 5-10  cm PS x 4 hours before she developed increased WOB, agitation, tachypnea, and tachycardia.

## 2012-01-05 NOTE — Progress Notes (Addendum)
ANTIBIOTIC CONSULT NOTE - FOLLOW UP  Pharmacy Consult for Zosyn Indication:  Pancreatitis, PNA  Allergies  Allergen Reactions  . Ciprofloxacin Nausea Only    REACTION: nausea    Patient Measurements: Height: 5\' 6"  (167.6 cm) Weight: 178 lb 12.7 oz (81.1 kg) IBW/kg (Calculated) : 59.3   Vital Signs: Temp: 98.3 F (36.8 C) (09/17 0800) Temp src: Axillary (09/17 0800) BP: 99/55 mmHg (09/17 0800) Pulse Rate: 66  (09/17 0800) Intake/Output from previous day: 09/16 0701 - 09/17 0700 In: 2485.3 [I.V.:1285.3; IV Piggyback:550; TPN:650] Out: 2385 [Urine:1333; Emesis/NG output:200; Drains:852] Intake/Output from this shift: Total I/O In: 177.5 [I.V.:27.5; IV Piggyback:100; TPN:50] Out: 400 [Urine:400]  Labs:  [facility] 01/05/12 0520 01/04/12 0429 01/03/12 0550  WBC 17.0* 25.0* 39.3*  HGB 7.7* 9.3* 9.6*  PLT 313 422* 566*  LABCREA -- -- --  CREATININE 1.26* 1.52* 0.77   Estimated Creatinine Clearance: 53.5 ml/min (by C-G formula based on Cr of 1.26). No results found for this basename: VANCOTROUGH:2,VANCOPEAK:2,VANCORANDOM:2,GENTTROUGH:2,GENTPEAK:2,GENTRANDOM:2,TOBRATROUGH:2,TOBRAPEAK:2,TOBRARND:2,AMIKACINPEAK:2,AMIKACINTROU:2,AMIKACIN:2, in the last 72 hours   Assessment:  55 yof on Day#6 Zosyn for septic shock (ruptured pancreatitic pseudocyst, HCAP vs aspiration), also on Day#2 of Flagyl for C.diff.    Tmax 103.8 @ 1300 on 9/16, afebrile since. WBC improving to 15K.  ARF post-op and Scr improving, CrCl 54 ml/min  9/17 CXR = Increased bibasilar atelectasis  Cultures 9/8 urine >> 45K mult organisms  9/11 blood x 2 >> NG-F 9/12 mrsa pcr >> negative 9/13 urine >> NG-F 9/13 Blood/fungus >> NGTD 9/13 Trach aspirate >> normal flora 9/13 Urine >> NG-F 9/14 CDiff >> Positive 9/15 Abd fluid/paracentesis x 2 >> NGTD 9/16 Blood x 2 >> pending  9/26 Urine >> pending 9/17 Endotracheal >> pending  Plan:   Continue Zosyn 3.375 gm IV q8h  Consider changing Protonix to  C.diff in the setting of C.diff.  Pharmacy will f/u   Geoffry Paradise, PharmD, BCPS Pager: [phone] 9:01 AM Pharmacy #: [phone]

## 2012-01-05 NOTE — Progress Notes (Signed)
PARENTERAL NUTRITION CONSULT NOTE - FOLLOW UP  Pharmacy Consult for TNA Indication: Severe pancreatitis  Allergies  Allergen Reactions  . Ciprofloxacin Nausea Only    REACTION: nausea    Patient Measurements: Height: 5\' 6"  (167.6 cm) Weight: 178 lb 12.7 oz (81.1 kg) IBW/kg (Calculated) : 59.3   Vital Signs: Temp: 99.2 F (37.3 C) (09/17 0346) Temp src: Oral (09/17 0346) BP: 99/55 mmHg (09/17 0800) Pulse Rate: 66  (09/17 0800) Intake/Output from previous day: 09/16 0701 - 09/17 0700 In: 2485.3 [I.V.:1285.3; IV Piggyback:550; TPN:650] Out: 2385 [Urine:1333; Emesis/NG output:200; Drains:852] Intake/Output from this shift: Total I/O In: 177.5 [I.V.:27.5; IV Piggyback:100; TPN:50] Out: 400 [Urine:400]  Labs:  [facility] 01/05/12 0520 01/04/12 0429 01/03/12 0550 01/02/12 1752  WBC 17.0* 25.0* 39.3* --  HGB 7.7* 9.3* 9.6* --  HCT 23.6* 29.2* 29.3* --  PLT 313 422* 566* --  APTT -- -- -- 34  INR -- -- -- 1.31    Basename 01/05/12 0520 01/04/12 0429 01/03/12 0550 01/02/12 1752  NA 143 141 141 --  K 3.0* 4.2 4.0 --  CL 112 109 109 --  CO2 23 21 21  --  GLUCOSE 151* 207* 136* --  BUN 41* 49* 32* --  CREATININE 1.26* 1.52* 0.77 --  LABCREA -- -- -- --  CREAT24HRUR -- -- -- --  CALCIUM 6.6* 6.6* 7.6* --  MG 2.5 -- -- --  PHOS 2.4 -- -- --  PROT 4.6* -- 6.0 5.5*  ALBUMIN 1.4* -- 1.6* 1.5*  AST 2252* -- 46* 29  ALT 1087* -- 9 9  ALKPHOS 81 -- 137* 95  BILITOT 0.4 -- 0.4 0.5  BILIDIR -- -- -- --  IBILI -- -- -- --  PREALBUMIN -- -- -- --  TRIG 131 -- -- --  CHOLHDL -- -- -- --  CHOL 43 -- -- --   Estimated Creatinine Clearance: 53.5 ml/min (by C-G formula based on Cr of 1.26).    Basename 01/05/12 0737 01/05/12 0352 01/05/12 0001  GLUCAP 159* 151* 142*   Insulin Requirements in the past 24 hours:   CBGs 117 - 159, required 28 units of ICU glycemic control SSI (Novolog 2-6 units Q4h)  Nutritional Goals:   RD recs 9/16: 1762 kcal, 86-95 grams  protein  Clinimix 5/20 @ 21ml/hr + IVF 20% MWF will provide 90 g protein/day, 2064 kCal on lipid days, 1584 Kcal on non-lipid days for an average of 1790 Kcal per day/week.  Current nutrition:   Diet: NPO  IVF: NS @ KVO  Assessment:   70 yof admitted 9/8 with epigastric abd pain, fever, chills and N/V. Pt with h/o recurrent pancreatitis and ETOH abuse, s/p ERCP with sphincterotomy on 10/06/2011. Dx with acute pancreatitis this admit, developed ileus. Rapid response called 9/12 for respiratory decline and lethargy - pt transfer to ICU and CCM consulted. Intubated 9/12. 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 Clinical deterioration with rising WBC and findings suggesting GI perforation, underwent exploratory laparotomy with debridement of necrotic pancreas and drainage of infected, ruptured pseudocyst 9/15. TNA started 9/16.    Renal/hepatic function: Scr bumped (monitor), AST/ALT was wnl and bumped to 2252/1087 this AM - ?shock liver 2/2 pancreatitis or TNA? - follow, Alk phost wnl.  Electrolytes:  HypoK, corrected Calcium 8.7,   Pre-Albumin: in process  TG/Cholesterol: wnl 9/17  GI ppx:  Protonix 40 mg IV q12h  Plan:  At 1800 tonight  Change Clinimix to E 5/20 @  40 ml/hr (no rate incr due to hypokalemia)  TNA to contain IV fat emulsion, standard multivitamins and trace elements only on MWF only due to ongoing shortage  KCl x 6 runs   Continue IVF with NS @ KVO   Monitor CBGs - continue MD ordered SSI q4h at this time  TNA labs Monday/Thursdays  Pharmacy will follow up daily  Geoffry Paradise, PharmD, BCPS Pager: [phone] 8:34 AM Pharmacy #: [phone]

## 2012-01-05 NOTE — Progress Notes (Signed)
2 Days Post-Op  Subjective: Remains critical ill on vent and pressore  Objective: Vital signs in last 24 hours: Temp:  [97.5 F (36.4 C)-103.8 F (39.9 C)] 99.2 F (37.3 C) (09/17 0346) Pulse Rate:  [69-118] 69  (09/17 0600) Resp:  [20-34] 25  (09/17 0600) BP: (82-125)/(46-71) 86/58 mmHg (09/17 0600) SpO2:  [93 %-100 %] 96 % (09/17 0600) FiO2 (%):  [40 %] 40 % (09/17 0346) Weight:  [178 lb 12.7 oz (81.1 kg)] 178 lb 12.7 oz (81.1 kg) (09/17 0500) Last BM Date: 01/03/12  Intake/Output from previous day: 09/16 0701 - 09/17 0700 In: 2370.3 [I.V.:1220.3; IV Piggyback:550; TPN:600] Out: 2385 [Urine:1333; Emesis/NG output:200; Drains:852] Intake/Output this shift:    Abdomen with fullness, anasarca, drains serous  Lab Results:   [facility] 01/05/12 0520 01/04/12 0429  WBC 17.0* 25.0*  HGB 7.7* 9.3*  HCT 23.6* 29.2*  PLT 313 422*   BMET  Basename 01/05/12 0520 01/04/12 0429  NA 143 141  K 3.0* 4.2  CL 112 109  CO2 23 21  GLUCOSE 151* 207*  BUN 41* 49*  CREATININE 1.26* 1.52*  CALCIUM 6.6* 6.6*   PT/INR  Basename 01/02/12 1752  LABPROT 16.5*  INR 1.31   ABG  Basename 01/05/12 0335 01/04/12 0458  PHART 7.462* 7.393  HCO3 22.0 18.5*    Studies/Results: US Paracentesis  01/03/2012  *RADIOLOGY REPORT*  Clinical Data: Sepsis, pancreatitis, possible additional intra- abdominal process, ascites  ULTRASOUND GUIDED PARACENTESIS  Comparison:  None  An ultrasound guided paracentesis was thoroughly discussed with the patient and questions answered.  The benefits, risks, alternatives and complications were also discussed.  The patient understands and wishes to proceed with the procedure.  Written consent was obtained.  Ultrasound was performed to localize and mark an adequate pocket of fluid in the left lower quadrant of the abdomen. The fluid did appear to have some debris evident.  The area was then prepped and draped in the normal sterile fashion.  1% Lidocaine was used for  local anesthesia.  Under ultrasound guidance a 19 gauge Yueh catheter was introduced.  Paracentesis was performed.  The catheter was removed and a dressing applied.  Complications:  None  Findings:  A total of approximately 1.5 liters of turbid brown- green fluid, with free floating debris was removed.  A fluid sample was sent for laboratory analysis.  IMPRESSION: Successful ultrasound guided paracentesis yielding 1.5 liters of ascites. However the fluid is concerning for acute intra-abdominal process. Findings discussed with attending/ordering physician.  Read BY Brayton El PA-C   Original Report Authenticated By: Donavan Burnet, M.D.    Dg Chest Port 1 View  01/05/2012  *RADIOLOGY REPORT*  Clinical Data: Atelectasis.  Effusions.  PORTABLE CHEST - 1 VIEW  Comparison: Chest 01/03/2012 and 01/04/2012.  Findings: Support tubes and lines are unchanged.  Lung volumes are lower than on the most recent study with increased basilar atelectasis, worse on the left.  No pneumothorax is identified. Heart size is normal.  IMPRESSION: Increased bibasilar atelectasis.  No other change.   Original Report Authenticated By: Bernadene Bell. Maricela Curet, M.D.    Dg Chest Port 1 View  01/04/2012  *RADIOLOGY REPORT*  Clinical Data: Increased heart rate.  Decreased oxygen saturation.  PORTABLE CHEST - 1 VIEW  Comparison: Plain film chest 01/03/2012.  Findings: Support tubes and lines are unchanged.  No pneumothorax is identified.  Discoid atelectasis in the lung bases is more notable on the right.  The lungs are otherwise clear.  IMPRESSION:  1.  Support apparatus in good position. 2.  Bibasilar atelectasis.   Original Report Authenticated By: Bernadene Bell. Maricela Curet, M.D.     Anti-infectives: Anti-infectives     Start     Dose/Rate Route Frequency Ordered Stop   01/03/12 1600   metroNIDAZOLE (FLAGYL) IVPB 500 mg        500 mg 100 mL/hr over 60 Minutes Intravenous Every 8 hours 01/03/12 1525     12/31/11 1100   vancomycin  (VANCOCIN) 750 mg in sodium chloride 0.9 % 150 mL IVPB  Status:  Discontinued        750 mg 150 mL/hr over 60 Minutes Intravenous Every 12 hours 12/31/11 0958 01/03/12 0849   12/30/11 1000   piperacillin-tazobactam (ZOSYN) IVPB 3.375 g        3.375 g 12.5 mL/hr over 240 Minutes Intravenous Every 8 hours 12/30/11 0901            Assessment/Plan: s/p ex lap for ruptured panc pseudocyst Sepsis  Suspect shock liver given LFT's WBC improved Cr. Improved Continuing care per CCM  LOS: 10 days    Breeana Sawtelle A 01/05/2012

## 2012-01-05 NOTE — Progress Notes (Signed)
Name: [name] MRN: [id number] DOB: 15-Jun-1955    LOS: 10  Referring Provider:  [name] Reason for Referral:  Acute Respiratory Failure  PULMONARY / CRITICAL CARE MEDICINE  HPI:  [name] is a 56 y.o. female who presented to [facility] ER on 12/26/11 with complaints of epigastric pain (10/10), fever, chills, nausea, vomiting and confusion. The patient has a history of ETOH abuse, but reported that she has not been able to drink more than a few ounces of alcohol due to her pancreatitis. Her last drink was on 09/05. MRCP  10/06/2011 showed dilated common bile duct with biliary sludge. She subsequently underwent an ERCP with sphincterotomy on 10/06/2011.  [facility] admitted the patient, she was fluid resuscitated and placed on prophylactic abx, alcohol withdrawal protocol, and pain management.  Initially the patient's lipase improved from 177 to 126, and her LFTs remained normal.  On 9/12  RRT was called for respiratory decline and lethargy, subsequently required mechanical ventilation & Exploratory laparotomy with debridement of necrotic pancreas and drainage of the upper abdomen.    Events Since Admission: 9/12 RRT and subsequent transfer to the ICU. 9/14 TFs held due to firm abdomen & sub glottic suctioning, WC higher 9/14 increased ascites, Persistent edema/inflammation surrounding the head of the pancreas, with an approximate 1.8 x 2.6 cm pseudocyst at the junction of the head and body; this is unchanged from the examination 3 days ago  and has increased slightly since the June, 2013 examination. Developing pseudocyst in the pancreatic head measuring approximately 1.3 x 2.6 cm, Large hiatal hernia. Nasogastric tube looped within the intrathoracic portion of the stomach. Oral contrast passes to rectum, Gaseous distention of the transverse colon.  9/14 doppler UE neg for DVT  9/15 1.5 L paracentesis - green brown fluid 9/15 Exploratory laparotomy with debridement of necrotic pancreas and  drainage of the upper abdomen      Current Status: Critically ill Off pressors now  defervesced  UO picked up  Calm on wua, asynchronous with vent   Vital Signs: Temp:  [97.5 F (36.4 C)-103.8 F (39.9 C)] 98.3 F (36.8 C) (09/17 0800) Pulse Rate:  [66-115] 66  (09/17 0800) Resp:  [21-34] 26  (09/17 0800) BP: (82-125)/(44-68) 99/55 mmHg (09/17 0800) SpO2:  [93 %-100 %] 98 % (09/17 0800) FiO2 (%):  [40 %] 40 % (09/17 0346) Weight:  [81.1 kg (178 lb 12.7 oz)] 81.1 kg (178 lb 12.7 oz) (09/17 0500)  Physical Examination: General: chronically ill  appearance, sedated and intubated. HEENT: Supple neck, no JVD, no tracheal deviation present.  Head: Normocephalic Mouth/Throat: Intubated Eyes: Conjunctivae are normal. Pupils are equal, round, and reactive to light. No scleral icterus.   Cardiovascular: Tachycardic, regular rhythm, normal heart sounds, S1 S2 and intact distal pulses.  Pulmonary/Chest: Coarse BS diffusely.  Abdominal: Soft but rounded and distended.  Tender to deep palpation. No guarding. Hyperactive bowel sounds. Midline lap incision with 2 JP drains -greenish brown fluid Musculoskeletal: 1+ edema and no tenderness.  Neurological: Sedated but moves all ext to command.   Principal Problem:  *Acute pancreatitis Active Problems:  Alcohol abuse  Epigastric abdominal pain  Leukocytosis  Hyponatremia  Acute respiratory failure  Shock  ARDS (adult respiratory distress syndrome)   ASSESSMENT AND PLAN  PULMONARY  Lab 01/05/12 0335 01/04/12 0458 01/03/12 2202 01/03/12 1542 01/03/12 0330  PHART 7.462* 7.393 7.366 7.290* 7.449  PCO2ART 31.1* 31.1* 28.2* 32.7* 29.3*  PO2ART 74.8* 65.5* 78.1* 76.5* 59.7*  HCO3 22.0 18.5* 15.8* 15.2* 20.0  O2SAT 95.3 90.8 95.2 92.5 90.4    CXR: 9/17  bibasilar atelectasis & effusions ETT 9/12>>>  A: 1)  Acute Hypoxic respiratory failure w/ respiratory alkalosis,  in setting of bilateral pulmonary infiltrates - ARDS  2)   Probable PNA HCAP vs Aspiration.       P:   Ventilate on ARDS protocol - down to 40%/PEEP 5. Start SBTs   CARDIOVASCULAR  Lab 01/02/12 1752 12/31/11 1220 12/31/11 0650  TROPONINI -- <0.30 <0.30  LATICACIDVEN 1.3 1.4 --  PROBNP -- -- --   ECG:   Sinus Tachycardia at 105 Lines:  RIJ 9/13 >>  A: Septic shock     In setting of pancreatitis & ruptured pseudocyst P: Continuous telemetry  RENAL  Lab 01/05/12 0520 01/04/12 0429 01/03/12 0550 01/02/12 1752 01/02/12 0415 01/01/12 0356 12/31/11 1220 12/30/11 0335  NA 143 141 141 138 138 -- -- --  K 3.0* 4.2 -- -- -- -- -- --  CL 112 109 109 107 107 -- -- --  CO2 23 21 21 23 20  -- -- --  BUN 41* 49* 32* 32* 34* -- -- --  CREATININE 1.26* 1.52* 0.77 0.77 0.77 -- -- --  CALCIUM 6.6* 6.6* 7.6* 7.7* 7.9* -- -- --  MG 2.5 -- -- -- 2.6* 2.4 2.1 3.0*  PHOS 2.4 -- -- -- 4.2 3.2 -- --   Intake/Output      09/16 0701 - 09/17 0700 09/17 0701 - 09/18 0700   I.V. (mL/kg) 1285.3 (15.8) 27.5 (0.3)   IV Piggyback 550 100   TPN 650 50   Total Intake(mL/kg) 2485.3 (30.6) 177.5 (2.2)   Urine (mL/kg/hr) 1333 (0.7) 400   Emesis/NG output 200    Drains 852    Blood     Total Output 2385 400   Net +100.3 -222.5         Foley:  intact  A: Acute renal failure - post op     Hyponatremia: resolved     Hypokalemia  P:  Strict I&O.        Chk fena       Replaced K  GASTROINTESTINAL  Lab 01/05/12 0520 01/03/12 0550 01/02/12 1752 01/02/12 0415 01/01/12 0356  AST 2252* 46* 29 45* 24  ALT 1087* 9 9 10 9   ALKPHOS 81 137* 95 122* 84  BILITOT 0.4 0.4 0.5 0.4 0.4  PROT 4.6* 6.0 5.5* 5.9* 5.4*  ALBUMIN 1.4* 1.6* 1.5* 1.6* 1.6*   Lipase     Component Value Date/Time   LIPASE 49 01/03/2012 0550   A:  Acute Pancreatitis - lipase levels  improved, liver function test normal       Protein calorie malnutrition       Guiaic pos       Shock Liver P:  Bowel rest -NG to LIS     protonix  q 12h     Will need post pyloric panda eventually -       Started TNA       HEMATOLOGIC  Lab 01/05/12 0520 01/04/12 0429 01/03/12 0550 01/02/12 1752 01/02/12 0415  HGB 7.7* 9.3* 9.6* 9.4* 10.5*  HCT 23.6* 29.2* 29.3* 29.1* 32.9*  PLT 313 422* 566* 417*417* 400  INR -- -- -- 1.31 --  APTT -- -- -- 34 --   A:  Mild Anemia - likely hemodilution from fluid administration P:  Recheck CBC      Transfuse for HGB less than 7     Hold sq lovenox  due to hb drop , use SCDs for dvt proph   INFECTIOUS  Lab 01/05/12 0520 01/04/12 0429 01/03/12 0550 01/02/12 1752 01/02/12 0415 12/31/11 1220  WBC 17.0* 25.0* 39.3* 38.6* 38.8* --  PROCALCITON -- -- 2.69 -- -- 2.93   Cultures: Blood - 9/11>>>ng Urine - 9/8 - Multiple bacterial morphotypes c diff pcr 9/14 >> POS 9/15 ascites >>ng  Antibiotics: Zosyn 9/11>>> Vanc 9/12>>>9/15 Flagyl 9/15 >>  A: Septic shock     UTI - positive for leuks, and nitrites, cultures likely contaminated     C diff colitis  P:   Continue empiric zosyn, low threshold to add fungal coverage but improving fevers & WC       Flagyl   ENDOCRINE CBG (last 3)   Basename 01/05/12 0737 01/05/12 0352 01/05/12 0001  GLUCAP 159* 151* 142*   A: No issues currently   P: CBG Q4H r/t high risk for hyperglycemia  NEUROLOGIC A: Alcohol withdrawal/ acute encephalopathy      Pain r/t acute pancreatitis     Chronic narcotic dependence  P: Versed int and fentanyl drip   Care during the described time interval was provided by me and/or other providers on the critical care team.  I have reviewed this patient's available data, including medical history, events of note, physical examination and test results as part of my evaluation  CC time x  35 m  Cyril Mourning MD. [facility]. [place] Pager [phone] If no response call [phone]

## 2012-01-06 ENCOUNTER — Inpatient Hospital Stay (HOSPITAL_COMMUNITY): Payer: MEDICAID

## 2012-01-06 ENCOUNTER — Encounter (HOSPITAL_COMMUNITY): Payer: Self-pay | Admitting: Gastroenterology

## 2012-01-06 ENCOUNTER — Encounter (HOSPITAL_COMMUNITY): Admission: EM | Disposition: A | Discharge status: Home Health | Payer: Self-pay | Source: Home / Self Care

## 2012-01-06 HISTORY — PX: ESOPHAGOGASTRODUODENOSCOPY: SHX5428

## 2012-01-06 LAB — CBC
HCT: 23 % — ABNORMAL LOW (ref 36.0–46.0)
MCH: 27.2 pg (ref 26.0–34.0)
MCHC: 31.7 g/dL (ref 30.0–36.0)
MCV: 85.8 fL (ref 78.0–100.0)
RDW: 16.8 % — ABNORMAL HIGH (ref 11.5–15.5)

## 2012-01-06 LAB — BASIC METABOLIC PANEL
BUN: 24 mg/dL — ABNORMAL HIGH (ref 6–23)
CO2: 24 mEq/L (ref 19–32)
Chloride: 112 mEq/L (ref 96–112)
Creatinine, Ser: 0.72 mg/dL (ref 0.50–1.10)
Glucose, Bld: 148 mg/dL — ABNORMAL HIGH (ref 70–99)

## 2012-01-06 LAB — GLUCOSE, CAPILLARY
Glucose-Capillary: 144 mg/dL — ABNORMAL HIGH (ref 70–99)
Glucose-Capillary: 154 mg/dL — ABNORMAL HIGH (ref 70–99)
Glucose-Capillary: 174 mg/dL — ABNORMAL HIGH (ref 70–99)

## 2012-01-06 LAB — BODY FLUID CULTURE: Culture: NO GROWTH

## 2012-01-06 LAB — VRE CULTURE: Special Requests: NORMAL

## 2012-01-06 SURGERY — EGD (ESOPHAGOGASTRODUODENOSCOPY)
Anesthesia: Moderate Sedation

## 2012-01-06 MED ORDER — SODIUM CHLORIDE 0.9 % IV SOLN: INTRAVENOUS | Status: DC

## 2012-01-06 MED ORDER — MIDAZOLAM HCL 5 MG/ML IJ SOLN: 2.0000 mg | INTRAMUSCULAR | Status: DC | PRN

## 2012-01-06 MED ORDER — FAT EMULSION 20 % IV EMUL
240.0000 mL | INTRAVENOUS | Status: DC
  Filled 2012-01-06: qty 250

## 2012-01-06 MED ORDER — FENTANYL CITRATE 0.05 MG/ML IJ SOLN
INTRAMUSCULAR | Status: AC
  Filled 2012-01-06: qty 2

## 2012-01-06 MED ORDER — SELENIUM 40 MCG/ML IV SOLN
INTRAVENOUS | Status: DC
  Filled 2012-01-06: qty 2000

## 2012-01-06 MED ORDER — SODIUM CHLORIDE 0.9 % IV SOLN
8.0000 mg/h | INTRAVENOUS | Status: AC
  Administered 2012-01-06 – 2012-01-08 (×4): 8 mg/h via INTRAVENOUS
  Filled 2012-01-06 (×8): qty 80

## 2012-01-06 MED ORDER — MIDAZOLAM HCL 10 MG/2ML IJ SOLN
INTRAMUSCULAR | Status: AC
  Filled 2012-01-06: qty 2

## 2012-01-06 MED ORDER — FAMOTIDINE IN NACL 20-0.9 MG/50ML-% IV SOLN
20.0000 mg | Freq: Two times a day (BID) | INTRAVENOUS | Status: DC
  Administered 2012-01-06 (×2): 20 mg via INTRAVENOUS
  Filled 2012-01-06 (×3): qty 50

## 2012-01-06 MED ORDER — FAT EMULSION 20 % IV EMUL
240.0000 mL | INTRAVENOUS | Status: AC
  Administered 2012-01-06: 240 mL via INTRAVENOUS
  Filled 2012-01-06: qty 250

## 2012-01-06 MED ORDER — FENTANYL CITRATE 0.05 MG/ML IJ SOLN
50.0000 ug | INTRAMUSCULAR | Status: DC | PRN
  Administered 2012-01-06 – 2012-01-09 (×20): 100 ug via INTRAVENOUS
  Administered 2012-01-09: 50 ug via INTRAVENOUS
  Administered 2012-01-09 (×2): 100 ug via INTRAVENOUS
  Administered 2012-01-09: 50 ug via INTRAVENOUS
  Administered 2012-01-09: 100 ug via INTRAVENOUS
  Filled 2012-01-06 (×26): qty 2

## 2012-01-06 MED ORDER — ZINC TRACE METAL 1 MG/ML IV SOLN
INTRAVENOUS | Status: AC
  Administered 2012-01-06: 17:00:00 via INTRAVENOUS
  Filled 2012-01-06: qty 2000

## 2012-01-06 NOTE — Consult Note (Signed)
Subjective:   HPI  The patient is a 56 year old female with a history of alcoholism who has recently undergone pancreatic debridement for pseudocyst rupture a few days ago. She is currently in the ICU and intubated. We are asked to see her in regards to melena which started this morning. She has had a recent drop in H&H over the last couple of days.  Review of Systems Unobtainable   Past Medical History  Diagnosis Date  . GERD (gastroesophageal reflux disease)   . Rosacea   . Elevated liver function tests   . Wears glasses   . Chronic diarrhea   . Alcohol abuse     H/o withdrawal   . PONV (postoperative nausea and vomiting)   . Pancreatitis    Past Surgical History  Procedure Date  . Cholecystectomy   . Shoulder surgery [id number]  . Diagnostic mammogram 2008  . Tubal ligation   . Colonoscopy Never  . Esophagogastroduodenoscopy 03/21/2011    Procedure: ESOPHAGOGASTRODUODENOSCOPY (EGD);  Surgeon: Yancey Flemings, MD;  Location: [facility] ENDOSCOPY;  Service: Endoscopy;  Laterality: N/A;  Patient may need to be done at bedside tomorrow depending on status  . Ercp 10/06/2011    Procedure: ENDOSCOPIC RETROGRADE CHOLANGIOPANCREATOGRAPHY (ERCP);  Surgeon: Theda Belfast, MD;  Location: [facility] ENDOSCOPY;  Service: Endoscopy;  Laterality: N/A;  . Laparotomy 01/03/2012    Procedure: EXPLORATORY LAPAROTOMY;  Surgeon: Valarie Merino, MD;  Location: [facility] ORS;  Service: General;  Laterality: N/A;  debridement of necrotic pancreas and placement of drains x2   History   Social History  . Marital Status: Divorced    Spouse Name: N/A    Number of Children: N/A  . Years of Education: N/A   Occupational History  . Not on file.   Social History Main Topics  . Smoking status: Former Smoker -- 5 years    Quit date: 09/22/1978  . Smokeless tobacco: Never Used  . Alcohol Use: 1.5 - 2.0 oz/week    3-4 drink(s) per week     gets dt's - can make it up to 1 week at times  . Drug Use: No  . Sexually Active: No     Works in data entry and accounting at [employer]   Other Topics Concern  . Not on file   Social History Narrative   Has a son and some friends with whom she is close. Was in rehab in 09/2010 and was sober for a few months after.    family history includes Heart disease in her father and sister; Heart failure in her father; and Stroke in her mother. Current facility-administered medications:0.9 %  sodium chloride infusion, , Intravenous, Continuous, Leanne Chang, NP, Last Rate: 20 mL/hr at 12/31/11 0508;  acetaminophen (TYLENOL) suppository 650 mg, 650 mg, Rectal, Q6H PRN, Ron Parker, MD, 650 mg at 01/04/12 1420;  antiseptic oral rinse (BIOTENE) solution 15 mL, 15 mL, Mouth Rinse, QID, Oretha Milch, MD, 15 mL at 01/06/12 0400 chlorhexidine (PERIDEX) 0.12 % solution 15 mL, 15 mL, Mouth Rinse, BID, Oretha Milch, MD, 15 mL at 01/06/12 0839;  famotidine (PEPCID) IVPB 20 mg, 20 mg, Intravenous, Q12H, Oretha Milch, MD;  fat emulsion 20 % infusion 240 mL, 240 mL, Intravenous, Continuous TPN, Gwen Her, PHARMD, Last Rate: 10 mL/hr at 01/04/12 1900, 240 mL at 01/04/12 1900;  fat emulsion 20 % infusion 240 mL, 240 mL, Intravenous, Continuous TPN, Thuyvan Thi Phan, PHARMD fentaNYL (SUBLIMAZE) injection 50-100 mcg, 50-100 mcg, Intravenous, Q2H  PRN, Oretha Milch, MD;  folic acid injection 1 mg, 1 mg, Intravenous, Daily, Simonne Martinet, NP, 1 mg at 01/06/12 1117;  insulin aspart (novoLOG) injection 2-6 Units, 2-6 Units, Subcutaneous, Q4H, Simonne Martinet, NP, 4 Units at 01/06/12 [phone];  iohexol (OMNIPAQUE) 180 MG/ML injection 25 mL, 25 mL, Oral, Once PRN, Merwyn Katos, MD metroNIDAZOLE (FLAGYL) IVPB 500 mg, 500 mg, Intravenous, Q8H, Lonia Farber, MD, 500 mg at 01/06/12 0839;  midazolam (VERSED) injection 2-4 mg, 2-4 mg, Intravenous, Q2H PRN, Oretha Milch, MD;  norepinephrine (LEVOPHED) 4 mg in dextrose 5 % 250 mL infusion, 2-50 mcg/min, Intravenous, Continuous, Lonia Farber, MD, Last Rate: 37.5 mL/hr at 01/05/12 0718, 10 mcg/min at 01/05/12 0718 ondansetron (ZOFRAN) injection 4 mg, 4 mg, Intravenous, Q6H PRN, Ron Parker, MD, 4 mg at 12/27/11 1849;  piperacillin-tazobactam (ZOSYN) IVPB 3.375 g, 3.375 g, Intravenous, Q8H, Richarda Overlie, MD, 3.375 g at 01/06/12 1117;  polyvinyl alcohol (LIQUIFILM TEARS) 1.4 % ophthalmic solution 1 drop, 1 drop, Both Eyes, PRN, Zigmund Gottron, MD, 1 drop at 01/06/12 0000 potassium chloride 10 mEq in 50 mL *CENTRAL LINE* IVPB, 10 mEq, Intravenous, Q1 Hr x 6, Thuyvan Thi Phan, PHARMD, 10 mEq at 01/05/12 1621;  potassium chloride 10 mEq in 50 mL *CENTRAL LINE* IVPB, 10 mEq, Intravenous, Q1 Hr x 4, Merwyn Katos, MD, 10 mEq at 01/05/12 2229;  TPN (CLINIMIX) +/- additives, , Intravenous, Continuous TPN, Gwen Her, PHARMD, Last Rate: 40 mL/hr at 01/04/12 1807 TPN (CLINIMIX) +/- additives, , Intravenous, Continuous TPN, Thuyvan Thi Phan, PHARMD, Last Rate: 40 mL/hr at 01/05/12 1728;  TPN (CLINIMIX) +/- additives, , Intravenous, Continuous TPN, Thuyvan Thi Phan, PHARMD;  DISCONTD: fat emulsion 20 % infusion 240 mL, 240 mL, Intravenous, Continuous TPN, Thuyvan Stacey Drain, PHARMD DISCONTD: fentaNYL (SUBLIMAZE) 10 mcg/mL in sodium chloride 0.9 % 250 mL infusion, 50-400 mcg/hr, Intravenous, Titrated, Alyson Reedy, MD, Last Rate: 10 mL/hr at 01/06/12 0300, 100 mcg/hr at 01/06/12 0300;  DISCONTD: fentaNYL (SUBLIMAZE) bolus via infusion 50-100 mcg, 50-100 mcg, Intravenous, Q6H PRN, Alyson Reedy, MD;  DISCONTD: midazolam (VERSED) injection 1-2 mg, 1-2 mg, Intravenous, Q2H PRN, Oretha Milch, MD, 2 mg at 01/05/12 1745 DISCONTD: pantoprazole (PROTONIX) injection 40 mg, 40 mg, Intravenous, Q12H, Richarda Overlie, MD, 40 mg at 01/05/12 2147;  DISCONTD: polyvinyl alcohol (LIQUIFILM TEARS) 1.4 % ophthalmic solution 1 drop, 1 drop, Both Eyes, PRN, Merwyn Katos, MD;  DISCONTD: thiamine (B-1) injection 100 mg, 100 mg, Intravenous, Daily,  Ron Parker, MD, 100 mg at 01/05/12 1102 DISCONTD: TPN (CLINIMIX) +/- additives, , Intravenous, Continuous TPN, Thuyvan Stacey Drain, PHARMD Allergies  Allergen Reactions  . Ciprofloxacin Nausea Only    REACTION: nausea     Objective:     BP 111/59  Pulse 91  Temp 99.4 F (37.4 C) (Axillary)  Resp 30  Ht 5\' 6"  (1.676 m)  Wt 79.9 kg (176 lb 2.4 oz)  BMI 28.43 kg/m2  SpO2 96%  LMP 12/08/2006  Not responsive  Heart regular rhythm  Lungs clear  Abdomen soft,  surgical drains  Laboratory No components found with this basename: d1      Assessment:     56 year old female status post recent pancreatic debridement now with melena. We are asked to see her to define the source of melena and determine whether there is an upper GI lesion such as an ulcer, gastritis or other abnormality.      Plan:  EGD later today. Discussed with Dr. Vassie Loll from critical care who is in agreement. I discussed the procedure with her son over the telephone today and obtained telephone consent for EGD.  Lab Results  Component Value Date   HGB 7.3* 01/06/2012   HGB 7.7* 01/05/2012   HGB 9.3* 01/04/2012   HCT 23.0* 01/06/2012   HCT 23.6* 01/05/2012   HCT 29.2* 01/04/2012   ALKPHOS 81 01/05/2012   ALKPHOS 137* 01/03/2012   ALKPHOS 95 01/02/2012   AST 2252* 01/05/2012   AST 46* 01/03/2012   AST 29 01/02/2012   ALT 1087* 01/05/2012   ALT 9 01/03/2012   ALT 9 01/02/2012   AMYLASE 187* 12/31/2011   AMYLASE 226* 10/05/2011   AMYLASE 21 09/20/2010

## 2012-01-06 NOTE — Progress Notes (Signed)
PCCM:   Cased discussed with Dr Evette Cristal while he was performing EGD.  The panda is irritating an ulceration seen at GE junction.  While not caused by the panda, the panda will exacerbate the issue.  Plan   D/c panda Strict NPO Cont TPN for now

## 2012-01-06 NOTE — Consult Note (Signed)
[facility] called about new wound VAC placement, bedside nursing ok to apply.  They will notify us if they need further assistance.  Daja Shuping [place], [place] [phone]

## 2012-01-06 NOTE — Progress Notes (Signed)
Name: [name] MRN: [id number] DOB: 07-02-1955    LOS: 11  Referring Provider:  [name] Reason for Referral:  Acute Respiratory Failure  PULMONARY / CRITICAL CARE MEDICINE  HPI:  [name] is a 56 y.o. female who presented to [facility] ER on 12/26/11 with complaints of epigastric pain (10/10), fever, chills, nausea, vomiting and confusion. The patient has a history of ETOH abuse, but reported that she has not been able to drink more than a few ounces of alcohol due to her pancreatitis. Her last drink was on 09/05. MRCP  10/06/2011 showed dilated common bile duct with biliary sludge. She subsequently underwent an ERCP with sphincterotomy on 10/06/2011.  [facility] admitted the patient, she was fluid resuscitated and placed on prophylactic abx, alcohol withdrawal protocol, and pain management.  Initially the patient's lipase improved from 177 to 126, and her LFTs remained normal.  On 9/12  RRT was called for respiratory decline and lethargy, subsequently required mechanical ventilation & Exploratory laparotomy with debridement of necrotic pancreas and drainage of the upper abdomen.    Events Since Admission: 9/12 RRT and subsequent transfer to the ICU. 9/14 TFs held due to firm abdomen & sub glottic suctioning, WC higher 9/14 increased ascites, Persistent edema/inflammation surrounding the head of the pancreas, with an approximate 1.8 x 2.6 cm pseudocyst at the junction of the head and body;  unchanged from the examination 3 days ago and has increased slightly since the June, 2013 examination. Developing pseudocyst in the pancreatic head measuring approximately 1.3 x 2.6 cm, Large hiatal hernia. Nasogastric tube looped within the intrathoracic portion of the stomach. Oral contrast passes to rectum, Gaseous distention of the transverse colon.  9/14 doppler UE neg for DVT  9/15 1.5 L paracentesis - green brown fluid 9/15 Exploratory laparotomy with debridement of necrotic pancreas and drainage of  the upper abdomen    Current Status: Critically ill Off pressors  Low grade fevers  UO picked up  Calm on wua, asynchronous with vent Dark stool   Vital Signs: Temp:  [97.7 F (36.5 C)-100.1 F (37.8 C)] 99.4 F (37.4 C) (09/18 0800) Pulse Rate:  [80-106] 89  (09/18 0600) Resp:  [19-32] 19  (09/18 0600) BP: (85-177)/(48-118) 98/55 mmHg (09/18 0600) SpO2:  [90 %-97 %] 97 % (09/18 0600) FiO2 (%):  [40 %] 40 % (09/18 0431) Weight:  [79.9 kg (176 lb 2.4 oz)] 79.9 kg (176 lb 2.4 oz) (09/18 0400)  Physical Examination: General: chronically ill  appearance, sedated and intubated. HEENT: Supple neck, no JVD, no tracheal deviation present.  Head: Normocephalic Mouth/Throat: Intubated Eyes: Conjunctivae are normal. Pupils are equal, round, and reactive to light. No scleral icterus.   Cardiovascular: Tachycardic, regular rhythm, normal heart sounds, S1 S2 and intact distal pulses.  Pulmonary/Chest: Coarse BS diffusely.  Abdominal: Soft but rounded and distended.  Tender to deep palpation. No guarding. Hyperactive bowel sounds. Midline lap incision with 2 JP drains - Right drain purulent, left drain serous  Musculoskeletal: 1+ edema and no tenderness.  Neurological: Sedated ,does not follow commands.   Principal Problem:  *Acute pancreatitis Active Problems:  Alcohol abuse  Epigastric abdominal pain  Leukocytosis  Hyponatremia  Acute respiratory failure  Shock  ARDS (adult respiratory distress syndrome)   ASSESSMENT AND PLAN  PULMONARY  Lab 01/05/12 0335 01/04/12 0458 01/03/12 2202 01/03/12 1542 01/03/12 0330  PHART 7.462* 7.393 7.366 7.290* 7.449  PCO2ART 31.1* 31.1* 28.2* 32.7* 29.3*  PO2ART 74.8* 65.5* 78.1* 76.5* 59.7*  HCO3 22.0 18.5* 15.8*  15.2* 20.0  O2SAT 95.3 90.8 95.2 92.5 90.4    CXR: 9/18  bibasilar atelectasis & effusions, panda loops in pharynx ETT 9/12>>>  A: 1)  Acute Hypoxic respiratory failure w/ respiratory alkalosis,  in setting of bilateral  pulmonary infiltrates - ARDS  2)  Probable PNA HCAP vs Aspiration.       P:   dc ARDS protocol - down to 40%/PEEP 5. SBTs - weaning on PS 10/5   CARDIOVASCULAR  Lab 01/02/12 1752 12/31/11 1220 12/31/11 0650  TROPONINI -- <0.30 <0.30  LATICACIDVEN 1.3 1.4 --  PROBNP -- -- --   ECG:   Sinus Tachycardia at 105 Lines:  RIJ 9/13 >>  A: Septic shock     In setting of pancreatitis & ruptured pseudocyst P: Continuous telemetry  RENAL  Lab 01/06/12 0350 01/05/12 0520 01/04/12 0429 01/03/12 0550 01/02/12 1752 01/02/12 0415 01/01/12 0356 12/31/11 1220  NA 143 143 141 141 138 -- -- --  K 3.7 3.0* -- -- -- -- -- --  CL 112 112 109 109 107 -- -- --  CO2 24 23 21 21 23  -- -- --  BUN 24* 41* 49* 32* 32* -- -- --  CREATININE 0.72 1.26* 1.52* 0.77 0.77 -- -- --  CALCIUM 6.8* 6.6* 6.6* 7.6* 7.7* -- -- --  MG -- 2.5 -- -- -- 2.6* 2.4 2.1  PHOS -- 2.4 -- -- -- 4.2 3.2 --   Intake/Output      09/17 0701 - 09/18 0700 09/18 0701 - 09/19 0700   I.V. (mL/kg) 657.4 (8.2)    IV Piggyback 860    TPN 1030    Total Intake(mL/kg) 2547.4 (31.9)    Urine (mL/kg/hr) 2145 (1.1)    Emesis/NG output     Drains 245    Total Output 2390    Net +157.4          Foley:  intact  A: Acute renal failure - post op, resolved, fena c/w pre renal     Hyponatremia: resolved     Hypokalemia  P:  Strict I&O.        Replaced K  GASTROINTESTINAL  Lab 01/05/12 0520 01/03/12 0550 01/02/12 1752 01/02/12 0415 01/01/12 0356  AST 2252* 46* 29 45* 24  ALT 1087* 9 9 10 9   ALKPHOS 81 137* 95 122* 84  BILITOT 0.4 0.4 0.5 0.4 0.4  PROT 4.6* 6.0 5.5* 5.9* 5.4*  ALBUMIN 1.4* 1.6* 1.5* 1.6* 1.6*   Lipase     Component Value Date/Time   LIPASE 49 01/03/2012 0550   A:  Acute Pancreatitis - lipase levels  improved, liver function test normal       Protein calorie malnutrition       Guiaic pos       Shock Liver P:  Bowel rest -NG to LIS     protonix  q 12h     Would prefer post pyloric panda  - hold off TFs      On TNA       HEMATOLOGIC  Lab 01/06/12 0350 01/05/12 0520 01/04/12 0429 01/03/12 0550 01/02/12 1752  HGB 7.3* 7.7* 9.3* 9.6* 9.4*  HCT 23.0* 23.6* 29.2* 29.3* 29.1*  PLT 359 313 422* 566* 417*417*  INR -- -- -- -- 1.31  APTT -- -- -- -- 34   A:  Mild Anemia - Guiaic pos P:  Recheck CBC      Transfuse for HGB less than 7     Held sq lovenox due  to hb drop , use SCDs for dvt proph   INFECTIOUS  Lab 01/06/12 0350 01/05/12 0520 01/04/12 0429 01/03/12 0550 01/02/12 1752 12/31/11 1220  WBC 17.5* 17.0* 25.0* 39.3* 38.6* --  PROCALCITON -- -- -- 2.69 -- 2.93   Cultures: Blood - 9/11>>>ng Urine - 9/8 - Multiple bacterial morphotypes c diff pcr 9/14 >> POS 9/15 ascites >>ng  Antibiotics: Zosyn 9/11>>> Vanc 9/12>>>9/15 Flagyl 9/15 >>  A: Septic shock     UTI - positive for leuks, and nitrites, cultures likely contaminated     C diff colitis  P:   Continue empiric zosyn until drains clear, low threshold to add fungal coverage but improving fevers & WC       Ct Flagyl   ENDOCRINE CBG (last 3)   Basename 01/06/12 0738 01/06/12 0402 01/05/12 2257  GLUCAP 154* 144* 136*   A: No issues currently   P: CBG Q4H r/t high risk for hyperglycemia  NEUROLOGIC A: Alcohol withdrawal/ acute encephalopathy      Pain r/t acute pancreatitis     Chronic narcotic dependence  P: Int Versed and fentanyl    Care during the described time interval was provided by me and/or other providers on the critical care team.  I have reviewed this patient's available data, including medical history, events of note, physical examination and test results as part of my evaluation  CC time x  35 m  Cyril Mourning MD. [facility]. [place] [facility] Pager [phone] If no response call [phone]

## 2012-01-06 NOTE — Progress Notes (Signed)
PARENTERAL NUTRITION CONSULT NOTE - FOLLOW UP  Pharmacy Consult for TNA Indication: Severe pancreatitis  Allergies  Allergen Reactions  . Ciprofloxacin Nausea Only    REACTION: nausea    Patient Measurements: Height: 5\' 6"  (167.6 cm) Weight: 176 lb 2.4 oz (79.9 kg) IBW/kg (Calculated) : 59.3   Vital Signs: Temp: 99.4 F (37.4 C) (09/18 0800) Temp src: Axillary (09/18 0800) BP: 98/55 mmHg (09/18 0600) Pulse Rate: 89  (09/18 0600) Intake/Output from previous day: 09/17 0701 - 09/18 0700 In: 2547.4 [I.V.:657.4; IV Piggyback:860; TPN:1030] Out: 2390 [Urine:2145; Drains:245] Intake/Output from this shift:    Labs:  [facility] 01/06/12 0350 01/05/12 0520 01/04/12 0429  WBC 17.5* 17.0* 25.0*  HGB 7.3* 7.7* 9.3*  HCT 23.0* 23.6* 29.2*  PLT 359 313 422*  APTT -- -- --  INR -- -- --    Basename 01/06/12 0350 01/05/12 1107 01/05/12 0520 01/04/12 0429  NA 143 -- 143 141  K 3.7 -- 3.0* 4.2  CL 112 -- 112 109  CO2 24 -- 23 21  GLUCOSE 148* -- 151* 207*  BUN 24* -- 41* 49*  CREATININE 0.72 -- 1.26* 1.52*  LABCREA -- 69.9 -- --  CREAT24HRUR -- -- -- --  CALCIUM 6.8* -- 6.6* 6.6*  MG -- -- 2.5 --  PHOS -- -- 2.4 --  PROT -- -- 4.6* --  ALBUMIN -- -- 1.4* --  AST -- -- 2252* --  ALT -- -- 1087* --  ALKPHOS -- -- 81 --  BILITOT -- -- 0.4 --  BILIDIR -- -- -- --  IBILI -- -- -- --  PREALBUMIN -- -- 2.3* --  TRIG -- -- 131 --  CHOLHDL -- -- -- --  CHOL -- -- 43 --   Estimated Creatinine Clearance: 83.7 ml/min (by C-G formula based on Cr of 0.72).    Basename 01/06/12 0738 01/06/12 0402 01/05/12 2257  GLUCAP 154* 144* 136*   Insulin Requirements in the past 24 hours:   CBGs 135-164, required 22 units of ICU glycemic control SSI (Novolog 2-6 units Q4h)  Nutritional Goals:   RD recs 9/16: 1762 kcal, 86-95 grams protein  Clinimix 5/20 @ 81ml/hr + IVF 20% MWF will provide 90 g protein/day, 2064 kCal on lipid days, 1584 Kcal on non-lipid days for an average of 1790  Kcal per day/week.  Current nutrition:   Diet: NPO  IVF: NS @ KVO  Assessment:   49 yof admitted 9/8 with epigastric abd pain, fever, chills and N/V. Pt with h/o recurrent pancreatitis and ETOH abuse, s/p ERCP with sphincterotomy on 10/06/2011. Dx with acute pancreatitis this admit, developed ileus. Rapid response called 9/12 for respiratory decline and lethargy - pt transfer to ICU and CCM consulted. Intubated 9/12. 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 Clinical deterioration with rising WBC and findings suggesting GI perforation, underwent exploratory laparotomy with debridement of necrotic pancreas and drainage of infected, ruptured pseudocyst 9/15. TNA started 9/16.    Renal/hepatic function: Scr wnl, AST/ALT was wnl and bumped to 2252/1087 on 9/17 AM - ?shock liver 2/2 pancreatitis per MD (?TNA) - follow, Alk phost wnl.  Electrolytes: Corrected Calcium 8.9, other lytes wnl  Pre-Albumin: 2.3 (9/17)  TG/Cholesterol: wnl 9/17  GI ppx:  Protonix 40 mg IV q12h  OK from surgery standpoint for feeding tube and tube feeds.  CCM noted to continue TNA given ?GI bleed.  Plan:  At 1800 tonight  Increase Clinimix to E 5/20 @  60 ml/hr   TNA to contain IV fat emulsion, standard multivitamins and trace elements only on MWF only due to ongoing shortage   Continue IVF with NS @ KVO   Monitor CBGs - continue MD ordered SSI q4h at this time  TNA labs Monday/Thursdays  Pharmacy will follow up daily  Geoffry Paradise, PharmD, BCPS Pager: [phone] 8:47 AM Pharmacy #: [phone]

## 2012-01-06 NOTE — Op Note (Signed)
[facility] [address] [place], [address]   ENDOSCOPY PROCEDURE REPORT  PATIENT: [name], [name]  MR#: [id number] BIRTHDATE: 1955-07-17 , 55  yrs. old GENDER: Female ENDOSCOPIST: Wandalee Ferdinand, MD REFERRED BY: PROCEDURE DATE:  01/06/2012 PROCEDURE: ASA CLASS: 3 INDICATIONS: melena MEDICATIONS: no sedation used TOPICAL ANESTHETIC: none  DESCRIPTION OF PROCEDURE:   After the risks benefits and alternatives of the procedure were thoroughly explained, informed consent was obtained.  The [id number]  endoscope was introduced through the mouth and advanced to the second portion of the duodenum      , .        The instrument was slowly withdrawn as the mucosa was fully examined.      FINDINGS:.in the duodenum the second portion looked normal and the bulb looked normal. The feeding tube was down in the distal duodenum.  Stomach: The antrum and body looked normal, but on retroflexion of the gastroscope in the cardia of the stomach there was a 10-12 mm ulcer with a friable base but no obvious visible vessel. It appeared that the feeding tube was rubbing across this ulcer when the lumen was more collapsed.  Esophagus: Normal  COMPLICATIONS:none  ENDOSCOPIC IMPRESSION:10-12 mm ulcer in the cardia of the stomach seen on retroflexion with a friable base. At times it appeared that the feeding tube was rubbing across this ulcer.   RECOMMENDATIONS:removed feeding tube so that it will not traumatize this ulcer. For the time being use TPN. Recommend reinstitution of pantoprazole even though she has Clostridium difficile, given the fact that she has this large ulcer in the cardia of the stomach.discussed with Dr. Shan Levans.      _______________________________ Rosalie DoctorWandalee Ferdinand, MD 01/06/2012 5:09 PM

## 2012-01-06 NOTE — Progress Notes (Signed)
3 Days Post-Op  Subjective: Off pressors Remains intubated and sedated Maintaining good UOP  Objective: Vital signs in last 24 hours: Temp:  [97.7 F (36.5 C)-100.1 F (37.8 C)] 99.2 F (37.3 C) (09/18 0400) Pulse Rate:  [66-106] 89  (09/18 0600) Resp:  [19-32] 19  (09/18 0600) BP: (85-177)/(48-118) 98/55 mmHg (09/18 0600) SpO2:  [90 %-98 %] 97 % (09/18 0600) FiO2 (%):  [40 %] 40 % (09/18 0431) Weight:  [176 lb 2.4 oz (79.9 kg)] 176 lb 2.4 oz (79.9 kg) (09/18 0400) Last BM Date: 12/26/11  Intake/Output from previous day: 09/17 0701 - 09/18 0700 In: 2547.4 [I.V.:657.4; IV Piggyback:860; TPN:1030] Out: 2390 [Urine:2145; Drains:245] Intake/Output this shift:    Abdomen still full.  Wound clean. Right drain purulent, left drain serous  Lab Results:   Basename 01/06/12 0350 01/05/12 0520  WBC 17.5* 17.0*  HGB 7.3* 7.7*  HCT 23.0* 23.6*  PLT 359 313   BMET  Basename 01/06/12 0350 01/05/12 0520  NA 143 143  K 3.7 3.0*  CL 112 112  CO2 24 23  GLUCOSE 148* 151*  BUN 24* 41*  CREATININE 0.72 1.26*  CALCIUM 6.8* 6.6*   PT/INR No results found for this basename: LABPROT:2,INR:2 in the last 72 hours ABG  Basename 01/05/12 0335 01/04/12 0458  PHART 7.462* 7.393  HCO3 22.0 18.5*    Studies/Results: Dg Abd 1 View  01/06/2012  *RADIOLOGY REPORT*  Clinical Data: Evaluate pending tube placement  ABDOMEN - 1 VIEW  Comparison: Portable abdomen 01/05/2012  Findings: The feeding tube tip is in the distal body - proximal antrum of the stomach.  The bowel gas pattern is nonspecific. Contrast is noted scattered throughout the nondistended colon.  IMPRESSION: Feeding tube tip in distal body - proximal antrum of the stomach.   Original Report Authenticated By: Juline Patch, M.D.    Dg Abd 1 View  01/05/2012  *RADIOLOGY REPORT*  Clinical Data: Evaluate feeding tube position.  ABDOMEN - 1 VIEW  Comparison: 01/05/2012 fluoroscopic image  Findings: KUB following administration of  Omnipaque contrast via the patient's feeding tube demonstrates the tube tip to now be positioned within the mid to distal stomach.  However, the administered contrast is noted to reflux proximally rather than continue into the duodenum.  There is residual contrast within the colon from a prior examination.  Mild bibasilar opacities/pleural effusions are noted.  Surgical clips right upper quadrant.  IMPRESSION: Feeding tube tip has migrated distally, now located within the mid/distal stomach.  However, administered contrast is noted to reflux into the more proximal stomach rather than continue into the duodenum. Therefore, I would not recommend tube feeds unless the tube tip has passed the gastroduodenal junction. Recommend repeat radiograph in the a.m. to evaluate for tube tip position at that time.   Original Report Authenticated By: Waneta Martins, M.D.    Dg Chest Port 1 View  01/06/2012  *RADIOLOGY REPORT*  Clinical Data: Pleural effusion.  PORTABLE CHEST - 1 VIEW  Comparison: 01/05/2012.  Findings: Patient is rotated to the left.  The endotracheal tube appears similar to the prior exam.  The nasogastric tube has been replaced with a feeding tube and the tip is not visualized.  There is redundant tubing over the thoracic inlet and there may be a coiled loop of the feeding tube in the proximal esophagus or hypopharynx.  Consider retracting and re-advancing the feeding tube for better positioning and to see if the loop produces.  Bibasilar atelectasis remains present.  Right IJ central line remains present.  IMPRESSION: 1.  Endotracheal tube and right IJ central line are unchanged. 2.  Exchange of nasogastric tube for feeding tube.  There appears to be a redundant loop in the hypopharynx and upper esophagus however this could be external to the patient as well.  The inspection is recommended. 3.  Persistent bilateral basilar atelectasis and probable left pleural effusion.  This is a call report.   Original  Report Authenticated By: Andreas Newport, M.D.    Dg Chest Port 1 View  01/05/2012  *RADIOLOGY REPORT*  Clinical Data: Atelectasis.  Effusions.  PORTABLE CHEST - 1 VIEW  Comparison: Chest 01/03/2012 and 01/04/2012.  Findings: Support tubes and lines are unchanged.  Lung volumes are lower than on the most recent study with increased basilar atelectasis, worse on the left.  No pneumothorax is identified. Heart size is normal.  IMPRESSION: Increased bibasilar atelectasis.  No other change.   Original Report Authenticated By: Bernadene Bell. D'ALESSIO, M.D.    Dg Intro Long Gi Tube  01/05/2012  *RADIOLOGY REPORT*  Clinical Data: Feeding tube placement.  DG INTRO LONG GI TUBE  Technique:  Feeding tube placement under fluoroscopy guidance  Fluoroscopy time: 21 seconds  Comparison:  01/02/2012 CT  Findings:  Under real time observation, feeding tube advanced distal to the hiatal hernia, into the intra-abdominal stomach. Unable to advance into the duodenum.  IMPRESSION: Feeding tube projects over the mid stomach. This is a suboptimal position for tube feeds, however recommend a follow-up radiograph to evaluate for distal migration of the tube via peristalsis.   Original Report Authenticated By: Waneta Martins, M.D.     Anti-infectives: Anti-infectives     Start     Dose/Rate Route Frequency Ordered Stop   01/03/12 1600   metroNIDAZOLE (FLAGYL) IVPB 500 mg        500 mg 100 mL/hr over 60 Minutes Intravenous Every 8 hours 01/03/12 1525     12/31/11 1100   vancomycin (VANCOCIN) 750 mg in sodium chloride 0.9 % 150 mL IVPB  Status:  Discontinued        750 mg 150 mL/hr over 60 Minutes Intravenous Every 12 hours 12/31/11 0958 01/03/12 0849   12/30/11 1000   piperacillin-tazobactam (ZOSYN) IVPB 3.375 g        3.375 g 12.5 mL/hr over 240 Minutes Intravenous Every 8 hours 12/30/11 0901            Assessment/Plan: S/p ruptured pseudocyst  WBC stable on iv ABX.  If WBC starts going up, will get a CT scan  to re-evaluate the pancrease and look for any undrained fluid collections  Ok from surgery standpoint for feeding tube and tube feeds  Will order a wound VAC  LOS: 11 days    Starlina Lapre A 01/06/2012

## 2012-01-07 ENCOUNTER — Encounter (HOSPITAL_COMMUNITY): Payer: Self-pay

## 2012-01-07 ENCOUNTER — Inpatient Hospital Stay (HOSPITAL_COMMUNITY): Payer: MEDICAID

## 2012-01-07 ENCOUNTER — Encounter (HOSPITAL_COMMUNITY): Payer: Self-pay | Admitting: Gastroenterology

## 2012-01-07 LAB — GLUCOSE, CAPILLARY
Glucose-Capillary: 169 mg/dL — ABNORMAL HIGH (ref 70–99)
Glucose-Capillary: 171 mg/dL — ABNORMAL HIGH (ref 70–99)
Glucose-Capillary: 218 mg/dL — ABNORMAL HIGH (ref 70–99)

## 2012-01-07 LAB — BASIC METABOLIC PANEL
BUN: 13 mg/dL (ref 6–23)
Calcium: 6.9 mg/dL — ABNORMAL LOW (ref 8.4–10.5)
Creatinine, Ser: 0.56 mg/dL (ref 0.50–1.10)
GFR calc non Af Amer: >90 mL/min (ref >90)
Glucose, Bld: 250 mg/dL — ABNORMAL HIGH (ref 70–99)

## 2012-01-07 LAB — COMPREHENSIVE METABOLIC PANEL
AST: 432 U/L — ABNORMAL HIGH (ref 0–37)
Albumin: 1.3 g/dL — ABNORMAL LOW (ref 3.5–5.2)
Calcium: 7 mg/dL — ABNORMAL LOW (ref 8.4–10.5)
Chloride: 112 mEq/L (ref 96–112)
Creatinine, Ser: 0.57 mg/dL (ref 0.50–1.10)
Total Bilirubin: 0.3 mg/dL (ref 0.3–1.2)
Total Protein: 5 g/dL — ABNORMAL LOW (ref 6.0–8.3)

## 2012-01-07 LAB — TYPE AND SCREEN
PT AG Type: NEGATIVE
Unit division: 0
Unit division: 0

## 2012-01-07 LAB — CBC
HCT: 21.1 % — ABNORMAL LOW (ref 36.0–46.0)
HCT: 25.2 % — ABNORMAL LOW (ref 36.0–46.0)
Hemoglobin: 6.8 g/dL — CL (ref 12.0–15.0)
Hemoglobin: 8.1 g/dL — ABNORMAL LOW (ref 12.0–15.0)
MCH: 27.6 pg (ref 26.0–34.0)
RBC: 2.47 MIL/uL — ABNORMAL LOW (ref 3.87–5.11)
RBC: 2.93 MIL/uL — ABNORMAL LOW (ref 3.87–5.11)
WBC: 17.9 10*3/uL — ABNORMAL HIGH (ref 4.0–10.5)

## 2012-01-07 LAB — PHOSPHORUS: Phosphorus: 1.7 mg/dL — ABNORMAL LOW (ref 2.3–4.6)

## 2012-01-07 LAB — BODY FLUID CULTURE: Gram Stain: NONE SEEN

## 2012-01-07 LAB — CULTURE, RESPIRATORY W GRAM STAIN

## 2012-01-07 LAB — PREPARE RBC (CROSSMATCH)

## 2012-01-07 MED ORDER — MIDAZOLAM HCL 5 MG/ML IJ SOLN
INTRAMUSCULAR | Status: AC
  Administered 2012-01-07: 2 mg
  Filled 2012-01-07: qty 1

## 2012-01-07 MED ORDER — POTASSIUM PHOSPHATE DIBASIC 3 MMOLE/ML IV SOLN
30.0000 mmol | Freq: Once | INTRAVENOUS | Status: AC
  Administered 2012-01-07: 30 mmol via INTRAVENOUS
  Filled 2012-01-07: qty 10

## 2012-01-07 MED ORDER — CLINIMIX E/DEXTROSE (5/20) 5 % IV SOLN
INTRAVENOUS | Status: AC
  Administered 2012-01-07: 17:00:00 via INTRAVENOUS
  Filled 2012-01-07: qty 2000

## 2012-01-07 MED ORDER — FUROSEMIDE 10 MG/ML IJ SOLN
20.0000 mg | Freq: Once | INTRAMUSCULAR | Status: AC
  Administered 2012-01-07: 20 mg via INTRAVENOUS
  Filled 2012-01-07: qty 2

## 2012-01-07 MED ORDER — POTASSIUM CHLORIDE 10 MEQ/50ML IV SOLN
10.0000 meq | INTRAVENOUS | Status: AC
  Administered 2012-01-07 (×3): 10 meq via INTRAVENOUS
  Filled 2012-01-07 (×3): qty 50

## 2012-01-07 MED ORDER — FENTANYL CITRATE 0.05 MG/ML IJ SOLN
INTRAMUSCULAR | Status: AC
  Administered 2012-01-07: 50 ug
  Filled 2012-01-07: qty 2

## 2012-01-07 NOTE — Progress Notes (Signed)
1 Day Post-Op  Subjective: Remains hemodynamically stable, but having fevers Sedated and on vent  Objective: Vital signs in last 24 hours: Temp:  [97.6 F (36.4 C)-101.2 F (38.4 C)] 99 F (37.2 C) (09/19 0400) Pulse Rate:  [91-110] 97  (09/19 0600) Resp:  [18-39] 28  (09/19 0600) BP: (99-156)/(46-81) 130/66 mmHg (09/19 0600) SpO2:  [93 %-97 %] 97 % (09/19 0600) FiO2 (%):  [40 %] 40 % (09/19 0733) Weight:  [167 lb 12.3 oz (76.1 kg)] 167 lb 12.3 oz (76.1 kg) (09/19 0000) Last BM Date: 01/06/12  Intake/Output from previous day: 09/18 0701 - 09/19 0700 In: 1965 [I.V.:405; IV Piggyback:350; TPN:1210] Out: 1275 [Urine:1145; Drains:130] Intake/Output this shift:    VAC in place.  Both drains now with purulence.  Abdomen is softer  Lab Results:   Basename 01/07/12 0430 01/06/12 0350  WBC 17.9* 17.5*  HGB 6.8* 7.3*  HCT 21.1* 23.0*  PLT 302 359   BMET  Basename 01/07/12 0430 01/06/12 0350  NA 143 143  K 3.1* 3.7  CL 112 112  CO2 24 24  GLUCOSE 199* 148*  BUN 15 24*  CREATININE 0.57 0.72  CALCIUM 7.0* 6.8*   PT/INR No results found for this basename: LABPROT:2,INR:2 in the last 72 hours ABG  Basename 01/05/12 0335  PHART 7.462*  HCO3 22.0    Studies/Results: Dg Abd 1 View  01/06/2012  *RADIOLOGY REPORT*  Clinical Data: Evaluate pending tube placement  ABDOMEN - 1 VIEW  Comparison: Portable abdomen 01/05/2012  Findings: The feeding tube tip is in the distal body - proximal antrum of the stomach.  The bowel gas pattern is nonspecific. Contrast is noted scattered throughout the nondistended colon.  IMPRESSION: Feeding tube tip in distal body - proximal antrum of the stomach.   Original Report Authenticated By: Juline Patch, M.D.    Dg Abd 1 View  01/05/2012  *RADIOLOGY REPORT*  Clinical Data: Evaluate feeding tube position.  ABDOMEN - 1 VIEW  Comparison: 01/05/2012 fluoroscopic image  Findings: KUB following administration of Omnipaque contrast via the patient's  feeding tube demonstrates the tube tip to now be positioned within the mid to distal stomach.  However, the administered contrast is noted to reflux proximally rather than continue into the duodenum.  There is residual contrast within the colon from a prior examination.  Mild bibasilar opacities/pleural effusions are noted.  Surgical clips right upper quadrant.  IMPRESSION: Feeding tube tip has migrated distally, now located within the mid/distal stomach.  However, administered contrast is noted to reflux into the more proximal stomach rather than continue into the duodenum. Therefore, I would not recommend tube feeds unless the tube tip has passed the gastroduodenal junction. Recommend repeat radiograph in the a.m. to evaluate for tube tip position at that time.   Original Report Authenticated By: Waneta Martins, M.D.    Dg Chest Port 1 View  01/07/2012  *RADIOLOGY REPORT*  Clinical Data: Endotracheal tube placement.  PORTABLE CHEST - 1 VIEW  Comparison: Chest 01/06/2012.  Findings: Endotracheal tube is in place with tip in good position at the level of the clavicular heads.  Feeding tube is no longer visualized.  Right IJ catheter remains in place.  Left basilar airspace opacity is unchanged.  Right lung appears clear.  No pneumothorax.  IMPRESSION:  1.  ET tube in good position. 2.  No change in left basilar airspace opacity.   Original Report Authenticated By: Bernadene Bell. D'ALESSIO, M.D.    Dg Chest Port 1  View  01/06/2012  *RADIOLOGY REPORT*  Clinical Data: Pleural effusion.  PORTABLE CHEST - 1 VIEW  Comparison: 01/05/2012.  Findings: Patient is rotated to the left.  The endotracheal tube appears similar to the prior exam.  The nasogastric tube has been replaced with a feeding tube and the tip is not visualized.  There is redundant tubing over the thoracic inlet and there may be a coiled loop of the feeding tube in the proximal esophagus or hypopharynx.  Consider retracting and re-advancing the feeding  tube for better positioning and to see if the loop produces.  Bibasilar atelectasis remains present. Right IJ central line remains present.  IMPRESSION: 1.  Endotracheal tube and right IJ central line are unchanged. 2.  Exchange of nasogastric tube for feeding tube.  There appears to be a redundant loop in the hypopharynx and upper esophagus however this could be external to the patient as well.  The inspection is recommended. 3.  Persistent bilateral basilar atelectasis and probable left pleural effusion.  This is a call report.   Original Report Authenticated By: Andreas Newport, M.D.    Dg Intro Long Gi Tube  01/05/2012  *RADIOLOGY REPORT*  Clinical Data: Feeding tube placement.  DG INTRO LONG GI TUBE  Technique:  Feeding tube placement under fluoroscopy guidance  Fluoroscopy time: 21 seconds  Comparison:  01/02/2012 CT  Findings:  Under real time observation, feeding tube advanced distal to the hiatal hernia, into the intra-abdominal stomach. Unable to advance into the duodenum.  IMPRESSION: Feeding tube projects over the mid stomach. This is a suboptimal position for tube feeds, however recommend a follow-up radiograph to evaluate for distal migration of the tube via peristalsis.   Original Report Authenticated By: Waneta Martins, M.D.     Anti-infectives: Anti-infectives     Start     Dose/Rate Route Frequency Ordered Stop   01/03/12 1600   metroNIDAZOLE (FLAGYL) IVPB 500 mg        500 mg 100 mL/hr over 60 Minutes Intravenous Every 8 hours 01/03/12 1525     12/31/11 1100   vancomycin (VANCOCIN) 750 mg in sodium chloride 0.9 % 150 mL IVPB  Status:  Discontinued        750 mg 150 mL/hr over 60 Minutes Intravenous Every 12 hours 12/31/11 0958 01/03/12 0849   12/30/11 1000   piperacillin-tazobactam (ZOSYN) IVPB 3.375 g        3.375 g 12.5 mL/hr over 240 Minutes Intravenous Every 8 hours 12/30/11 0901            Assessment/Plan: s/p Procedure(s) (LRB) with  comments: ESOPHAGOGASTRODUODENOSCOPY (EGD) (N/A) - bedside  Continuing drains, antibiotics, and supportive care. Probable CT scan tomorrow to re-evaluate the pancreas, etc  LOS: 12 days    Bonnee Zertuche A 01/07/2012

## 2012-01-07 NOTE — Progress Notes (Signed)
CARE MANAGEMENT NOTE 01/07/2012  Patient:  [name], [name]   Account Number:  [id number]  Date Initiated:  12/31/2011  Documentation initiated by:  DAVIS,RHONDA  Subjective/Objective Assessment:   pt with hx of etoh abuse and admitted for acute pancreatitis, transferred to step-down due to resp failure and requiring vent-mask at 50% for o2 level to be at 90%     Action/Plan:   lives alone   Anticipated DC Date:  01/10/2012   Anticipated DC Plan:  HOME/SELF CARE  In-house referral  Clinical Social Worker      DC Planning Services  NA      [facility] Choice  NA   Choice offered to / List presented to:  NA   DME arranged  NA      DME agency  NA     HH arranged  NA      HH agency  NA   Status of service:  In process, will continue to follow Medicare Important Message given?  NA - LOS <3 / Initial given by admissions (If response is "NO", the following Medicare IM given date fields will be blank) Date Medicare IM given:   Date Additional Medicare IM given:    Discharge Disposition:    Per UR Regulation:  Reviewed for med. necessity/level of care/duration of stay  If discussed at Long Length of Stay Meetings, dates discussed:    Comments:  [id number] Earlene Plater, RN, BSN, CCM: CHART REVIEWED AND UPDATED. NO DISCHARGE NEEDS PRESENT AT THIS TIME. hgb 6.8, transfusion today, vent wean unsuccessful, unresponsive. CASE MANAGEMENT [phone]  CASE MANAGEMENT [phone]  [id number] Earlene Plater, RN, BSN, CCM: CHART REVIEWED AND UPDATED. NO DISCHARGE NEEDS PRESENT AT THIS TIME.pt had exp.lap with removal of necrotic head of pancrease and removal of large amount of purlent drainage, brought back to icu and on vent. CASE MANAGEMENT [phone]    [id number] Earlene Plater, RN, BSN, CCM: CHART REVIEWED AND UPDATED. NO DISCHARGE NEEDS PRESENT AT THIS TIME. CASE MANAGEMENT [phone]

## 2012-01-07 NOTE — Progress Notes (Signed)
CRITICAL VALUE ALERT  Critical value received: Hbg 6.8  Date of notification:  01/07/12  Time of notification:  0515  Critical value read back:yes  Nurse who received alert:  T. Joseph Art, RN   MD notified (1st page):  Audelia Hives MD  Time of first page:  [phone]  MD notified (2nd page):  Time of second page:  Responding MD:  Audelia Hives  Time MD responded:  [phone]

## 2012-01-07 NOTE — Progress Notes (Signed)
Name: [name] MRN: [id number] DOB: 08/21/1955    LOS: 12  Referring Provider:  [name] Reason for Referral:  Acute Respiratory Failure  PULMONARY / CRITICAL CARE MEDICINE  HPI:  [name] is a 57 y.o. female who presented to [facility] ER on 12/26/11 with complaints of epigastric pain (10/10), fever, chills, nausea, vomiting and confusion. The patient has a history of ETOH abuse, but reported that she has not been able to drink more than a few ounces of alcohol due to her pancreatitis. Her last drink was on 09/05. MRCP  10/06/2011 showed dilated common bile duct with biliary sludge. She subsequently underwent an ERCP with sphincterotomy on 10/06/2011.  [facility] admitted the patient, she was fluid resuscitated and placed on prophylactic abx, alcohol withdrawal protocol, and pain management.  Initially the patient's lipase improved from 177 to 126, and her LFTs remained normal.  On 9/12  RRT was called for respiratory decline and lethargy, subsequently required mechanical ventilation & Exploratory laparotomy with debridement of necrotic pancreas and drainage of the upper abdomen.    Events Since Admission: 9/12 RRT and subsequent transfer to the ICU. 9/14 TFs held due to firm abdomen & sub glottic suctioning, WC higher 9/14 increased ascites, Persistent edema/inflammation surrounding the head of the pancreas, with an approximate 1.8 x 2.6 cm pseudocyst at the junction of the head and body; this is unchanged from the examination 3 days ago  and has increased slightly since the June, 2013 examination. Developing pseudocyst in the pancreatic head measuring approximately 1.3 x 2.6 cm, Large hiatal hernia. Nasogastric tube looped within the intrathoracic portion of the stomach. Oral contrast passes to rectum, Gaseous distention of the transverse colon.  9/14 doppler UE neg for DVT  9/15 1.5 L paracentesis - green brown fluid 9/15 Exploratory laparotomy with debridement of necrotic pancreas and  drainage of the upper abdomen 9/18 EGD (Ganem) - 10-12 mm ulcer in the cardia of the stomach, protonix gtt, removed panda   Current Status: Remains Critically ill Off pressors   Int fevers Tm 101  UO low Off cont sedation x 48 but remains sedate   Vital Signs: Temp:  [97.6 F (36.4 C)-101.2 F (38.4 C)] 99 F (37.2 C) (09/19 0400) Pulse Rate:  [91-110] 97  (09/19 0600) Resp:  [18-39] 28  (09/19 0600) BP: (99-156)/(46-81) 130/66 mmHg (09/19 0600) SpO2:  [93 %-97 %] 97 % (09/19 0600) FiO2 (%):  [40 %] 40 % (09/19 0733) Weight:  [76.1 kg (167 lb 12.3 oz)] 76.1 kg (167 lb 12.3 oz) (09/19 0000)  Physical Examination: General: chronically ill  appearance, intubated. HEENT: Supple neck, no JVD, no tracheal deviation present.  Head: Normocephalic Mouth/Throat: Intubated Eyes: Conjunctivae are normal. Pupils are equal, round, and reactive to light. No scleral icterus.   Cardiovascular: Tachycardic, regular rhythm, normal heart sounds, S1 S2 and intact distal pulses.  Pulmonary/Chest: Coarse BS diffusely.  Abdominal: Soft but rounded and distended.  Tender to deep palpation. No guarding. Hyperactive bowel sounds. Midline lap incision with 2 JP drains -greenish brown fluid Musculoskeletal: 2+ edema and no tenderness.  Neurological: Sedated but  Opens eyes, moves all ext to command.   Principal Problem:  *Acute pancreatitis Active Problems:  Alcohol abuse  Epigastric abdominal pain  Leukocytosis  Hyponatremia  Acute respiratory failure  Shock  ARDS (adult respiratory distress syndrome)   ASSESSMENT AND PLAN  PULMONARY  Lab 01/05/12 0335 01/04/12 0458 01/03/12 2202 01/03/12 1542 01/03/12 0330  PHART 7.462* 7.393 7.366 7.290* 7.449  PCO2ART  31.1* 31.1* 28.2* 32.7* 29.3*  PO2ART 74.8* 65.5* 78.1* 76.5* 59.7*  HCO3 22.0 18.5* 15.8* 15.2* 20.0  O2SAT 95.3 90.8 95.2 92.5 90.4    CXR: 9/17  bibasilar atelectasis & effusions ETT 9/12>>>  A: 1)  Acute Hypoxic respiratory  failure w/ respiratory alkalosis,  in setting of bilateral pulmonary infiltrates - ARDS  2)  Probable PNA HCAP vs Aspiration.       P:   dc'd  ARDS protocol - down to 40%/PEEP 5. ct SBTs -mental status main barrier to extubation   CARDIOVASCULAR  Lab 01/02/12 1752 12/31/11 1220  TROPONINI -- <0.30  LATICACIDVEN 1.3 1.4  PROBNP -- --   ECG:   Sinus Tachycardia at 105 Lines:  RIJ 9/13 >>  A: Septic shock     In setting of pancreatitis & ruptured pseudocyst P: Continuous telemetry  RENAL  Lab 01/07/12 0430 01/06/12 0350 01/05/12 0520 01/04/12 0429 01/03/12 0550 01/02/12 0415 01/01/12 0356 12/31/11 1220  NA 143 143 143 141 141 -- -- --  K 3.1* 3.7 -- -- -- -- -- --  CL 112 112 112 109 109 -- -- --  CO2 24 24 23 21 21  -- -- --  BUN 15 24* 41* 49* 32* -- -- --  CREATININE 0.57 0.72 1.26* 1.52* 0.77 -- -- --  CALCIUM 7.0* 6.8* 6.6* 6.6* 7.6* -- -- --  MG 2.4 -- 2.5 -- -- 2.6* 2.4 2.1  PHOS 1.7* -- 2.4 -- -- 4.2 3.2 --   Intake/Output      09/18 0701 - 09/19 0700 09/19 0701 - 09/20 0700   I.V. (mL/kg) 405 (5.3)    IV Piggyback 350    TPN 1210    Total Intake(mL/kg) 1965 (25.8)    Urine (mL/kg/hr) 1145 (0.6)    Drains 130    Total Output 1275    Net +690          Foley:  intact  A: Acute renal failure - post op     Hyponatremia: resolved     Hypokalemia  P:  Strict I&O.       Replaced K -phos       Lasix 20 mg - low CVP but ana sarca  GASTROINTESTINAL  Lab 01/07/12 0430 01/05/12 0520 01/03/12 0550 01/02/12 1752 01/02/12 0415  AST 432* 2252* 46* 29 45*  ALT 575* 1087* 9 9 10   ALKPHOS 123* 81 137* 95 122*  BILITOT 0.3 0.4 0.4 0.5 0.4  PROT 5.0* 4.6* 6.0 5.5* 5.9*  ALBUMIN 1.3* 1.4* 1.6* 1.5* 1.6*   Lipase     Component Value Date/Time   LIPASE 49 01/03/2012 0550   A:  Acute Pancreatitis - lipase levels  improved, liver function test normal       Protein calorie malnutrition       Guiaic pos -NG dc'd due to gastric ulcer       Shock Liver -improving P:  protonix gtt     Will need post pyloric panda eventually for TFs -(unable to place by IR )     ct TNA       HEMATOLOGIC  Lab 01/07/12 0430 01/06/12 0350 01/05/12 0520 01/04/12 0429 01/03/12 0550 01/02/12 1752  HGB 6.8* 7.3* 7.7* 9.3* 9.6* --  HCT 21.1* 23.0* 23.6* 29.2* 29.3* --  PLT 302 359 313 422* 566* --  INR -- -- -- -- -- 1.31  APTT -- -- -- -- -- 34   A:  Mild Anemia - likely hemodilution from fluid  administration P:  Recheck CBC      Transfuse 1U - goal  HGB 7     Hold sq lovenox due to hb drop , use SCDs for dvt proph -high risk   INFECTIOUS  Lab 01/07/12 0430 01/06/12 0350 01/05/12 0520 01/04/12 0429 01/03/12 0550 12/31/11 1220  WBC 17.9* 17.5* 17.0* 25.0* 39.3* --  PROCALCITON -- -- -- -- 2.69 2.93   Cultures: Blood - 9/11>>>ng Urine - 9/8 - Multiple bacterial morphotypes c diff pcr 9/14 >> POS 9/15 ascites >>ng  Antibiotics: Zosyn 9/11>>> Vanc 9/12>>>9/15 Flagyl 9/15 >>  A: Septic shock -resolved     UTI - positive for leuks, and nitrites, cultures likely contaminated     C diff colitis     Peritonitis  P:   Continue empiric zosyn, low threshold to add fungal coverage but improved WC       Ct Flagyl        Concern for purulent drainage - defer to surgery  ENDOCRINE CBG (last 3)   Basename 01/07/12 0023 01/06/12 2014 01/06/12 1533  GLUCAP 169* 174* 154*   A: No issues currently   P: CBG Q4H r/t high risk for hyperglycemia  NEUROLOGIC A: Alcohol withdrawal/ acute encephalopathy      Pain r/t acute pancreatitis     Chronic narcotic dependence  P: dc  Versed , int fentanyl       chk ammonia - if high start lactulose        Care during the described time interval was provided by me and/or other providers on the critical care team.  I have reviewed this patient's available data, including medical history, events of note, physical examination and test results as part of my evaluation  CC time x  40 m  Cyril Mourning MD. [facility]. [facility] Pager [phone] If no response call [phone]

## 2012-01-07 NOTE — Progress Notes (Signed)
PARENTERAL NUTRITION CONSULT NOTE - FOLLOW UP  Pharmacy Consult for TNA Indication: Severe pancreatitis  Allergies  Allergen Reactions  . Ciprofloxacin Nausea Only    REACTION: nausea   Patient Measurements: Height: 5\' 6"  (167.6 cm) Weight: 167 lb 12.3 oz (76.1 kg) IBW/kg (Calculated) : 59.3   Vital Signs: Temp: 99 F (37.2 C) (09/19 0400) Temp src: Oral (09/19 0400) BP: 130/66 mmHg (09/19 0600) Pulse Rate: 97  (09/19 0600) Intake/Output from previous day: 09/18 0701 - 09/19 0700 In: 1965 [I.V.:405; IV Piggyback:350; TPN:1210] Out: 1275 [Urine:1145; Drains:130] Intake/Output from this shift:    Labs:  [name] 01/07/12 0430 01/06/12 0350 01/05/12 0520  WBC 17.9* 17.5* 17.0*  HGB 6.8* 7.3* 7.7*  HCT 21.1* 23.0* 23.6*  PLT 302 359 313  APTT -- -- --  INR -- -- --    Basename 01/07/12 0430 01/06/12 0350 01/05/12 1107 01/05/12 0520  NA 143 143 -- 143  K 3.1* 3.7 -- 3.0*  CL 112 112 -- 112  CO2 24 24 -- 23  GLUCOSE 199* 148* -- 151*  BUN 15 24* -- 41*  CREATININE 0.57 0.72 -- 1.26*  LABCREA -- -- 69.9 --  CREAT24HRUR -- -- -- --  CALCIUM 7.0* 6.8* -- 6.6*  MG 2.4 -- -- 2.5  PHOS 1.7* -- -- 2.4  PROT 5.0* -- -- 4.6*  ALBUMIN 1.3* -- -- 1.4*  AST 432* -- -- 2252*  ALT 575* -- -- 1087*  ALKPHOS 123* -- -- 81  BILITOT 0.3 -- -- 0.4  BILIDIR -- -- -- --  IBILI -- -- -- --  PREALBUMIN -- -- -- 2.3*  TRIG -- -- -- 131  CHOLHDL -- -- -- --  CHOL -- -- -- 43   Estimated Creatinine Clearance: 81.8 ml/min (by C-G formula based on Cr of 0.57).    Basename 01/07/12 0023 01/06/12 2014 01/06/12 1533  GLUCAP 169* 174* 154*   Insulin Requirements in the past 24 hours:   CBGs 136-174, required 24 units of ICU glycemic control SSI per MD (Novolog 2-6 units Q4h)  Nutritional Goals:   RD recs 9/16: 1762 kcal, 86-95 grams protein  Clinimix 5/20 @ 32ml/hr + IVF 20% MWF will provide 90 g protein/day, 2064 kCal on lipid days, 1584 Kcal on non-lipid days for an average  of 1790 Kcal per day/week.  Current nutrition:   Diet: NPO  IVF: NS @ KVO  Assessment:   75 yof admitted 9/8 with epigastric abd pain, fever, chills and N/V. Pt with h/o recurrent pancreatitis and ETOH abuse, s/p ERCP with sphincterotomy on 10/06/2011. Dx with acute pancreatitis this admit, developed ileus. Rapid response called 9/12 for respiratory decline and lethargy - pt transfer to ICU and CCM consulted. Intubated 9/12. 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 Clinical deterioration with rising WBC and findings suggesting GI perforation, underwent exploratory laparotomy with debridement of necrotic pancreas and drainage of infected, ruptured pseudocyst 9/15. TNA started 9/16.   OK from surgery standpoint for feeding tube and tube feeds but EGD 9/18 with ulcer in the cardia of the stomach - continue TNA  Renal/hepatic fxn: Scr wnl, AST/ALT was wnl and bumped to 2252/1087 on 9/17 AM. ?shock liver 2/2 pancreatitis. Now improved to 432/575.  Alk phos trending up. Lytes: low K and phos (MD repleted), correct calcium 9 (wnl) Pre-Alb: 2.3 (9/17) TG/Cholesterol: wnl 9/17 GI ppx:  Protonix 40 mg IV q12h  Plan:  At 1800 tonight  Continue Clinimix E 5/20 @ 60 ml/hr given electrolytes disturbances.    TNA to contain IV fat emulsion, standard multivitamins and trace elements only on MWF only due to ongoing shortage   Continue IVF with NS @ KVO   MD repleted K and phos, will check levels in AM  Monitor CBGs - continue MD ordered SSI q4h at this time, change if needed  TNA labs Monday/Thursdays  Pharmacy will follow up daily  Geoffry Paradise, PharmD, BCPS Pager: [phone] 8:41 AM Pharmacy #: [phone]

## 2012-01-07 NOTE — Progress Notes (Signed)
RN reports that patient had a brownish stool today. No overt GI bleeding. HGB 6.8. Will be getting a transfusion. EGD showed an ulcer in the cardia of stomach. Continue PPI. Follow for signs of significant bleeding.

## 2012-01-07 NOTE — Progress Notes (Signed)
eLink Physician-Brief Progress Note Patient Name: [name] DOB: [birth year] MRN: [id number]  Date of Service  01/07/2012   HPI/Events of Note  Hgb down to 6.8 from 7.3.  Scoped yesterday with no active bleeding noted but ulcer at [name] site.  Remains HD stable.     eICU Interventions  Plan: Transfuse one unit of pRBC Post-txf CBC   Intervention Category Intermediate Interventions: Bleeding - evaluation and treatment with blood products  Zaydyn Havey 01/07/2012, 5:18 AM

## 2012-01-08 ENCOUNTER — Inpatient Hospital Stay (HOSPITAL_COMMUNITY): Payer: MEDICAID

## 2012-01-08 LAB — GLUCOSE, CAPILLARY
Glucose-Capillary: 141 mg/dL — ABNORMAL HIGH (ref 70–99)
Glucose-Capillary: 146 mg/dL — ABNORMAL HIGH (ref 70–99)
Glucose-Capillary: 161 mg/dL — ABNORMAL HIGH (ref 70–99)
Glucose-Capillary: 178 mg/dL — ABNORMAL HIGH (ref 70–99)
Glucose-Capillary: 205 mg/dL — ABNORMAL HIGH (ref 70–99)

## 2012-01-08 LAB — CULTURE, BLOOD (ROUTINE X 2)
Culture: NO GROWTH
Culture: NO GROWTH

## 2012-01-08 LAB — BASIC METABOLIC PANEL
CO2: 24 mEq/L (ref 19–32)
CO2: 27 mEq/L (ref 19–32)
Calcium: 7.2 mg/dL — ABNORMAL LOW (ref 8.4–10.5)
Chloride: 110 mEq/L (ref 96–112)
Chloride: 102 mEq/L (ref 96–112)
Creatinine, Ser: 0.56 mg/dL (ref 0.50–1.10)
Potassium: 3.3 mEq/L — ABNORMAL LOW (ref 3.5–5.1)
Potassium: 3.3 mEq/L — ABNORMAL LOW (ref 3.5–5.1)
Sodium: 142 mEq/L (ref 135–145)

## 2012-01-08 LAB — ANAEROBIC CULTURE

## 2012-01-08 LAB — CBC
Hemoglobin: 11.5 g/dL — ABNORMAL LOW (ref 12.0–15.0)
MCV: 86.1 fL (ref 78.0–100.0)
Platelets: 277 10*3/uL (ref 150–400)
RBC: 4.17 MIL/uL (ref 3.87–5.11)
WBC: 10.5 10*3/uL (ref 4.0–10.5)

## 2012-01-08 LAB — PHOSPHORUS: Phosphorus: 2.2 mg/dL — ABNORMAL LOW (ref 2.3–4.6)

## 2012-01-08 MED ORDER — PANTOPRAZOLE SODIUM 40 MG IV SOLR
40.0000 mg | Freq: Two times a day (BID) | INTRAVENOUS | Status: DC
  Administered 2012-01-08 – 2012-01-25 (×35): 40 mg via INTRAVENOUS
  Filled 2012-01-08 (×39): qty 40

## 2012-01-08 MED ORDER — FUROSEMIDE 10 MG/ML IJ SOLN
40.0000 mg | Freq: Three times a day (TID) | INTRAMUSCULAR | Status: DC
  Administered 2012-01-08 (×3): 40 mg via INTRAVENOUS
  Filled 2012-01-08 (×4): qty 4

## 2012-01-08 MED ORDER — ZINC TRACE METAL 1 MG/ML IV SOLN
INTRAVENOUS | Status: AC
  Administered 2012-01-08: 18:00:00 via INTRAVENOUS
  Filled 2012-01-08: qty 2000

## 2012-01-08 MED ORDER — MIDAZOLAM HCL 5 MG/ML IJ SOLN
1.0000 mg | INTRAMUSCULAR | Status: DC | PRN
  Administered 2012-01-08 – 2012-01-09 (×8): 2 mg via INTRAVENOUS
  Administered 2012-01-10: 1 mg via INTRAVENOUS
  Administered 2012-01-10: 2 mg via INTRAVENOUS
  Filled 2012-01-08 (×10): qty 1

## 2012-01-08 MED ORDER — PANTOPRAZOLE SODIUM 40 MG IV SOLR: 40.0000 mg | Freq: Two times a day (BID) | INTRAVENOUS | Status: DC

## 2012-01-08 MED ORDER — IOHEXOL 300 MG/ML  SOLN
100.0000 mL | Freq: Once | INTRAMUSCULAR | Status: AC | PRN
  Administered 2012-01-08: 100 mL via INTRAVENOUS

## 2012-01-08 MED ORDER — POTASSIUM CHLORIDE 10 MEQ/50ML IV SOLN
10.0000 meq | INTRAVENOUS | Status: AC
  Administered 2012-01-08 (×3): 10 meq via INTRAVENOUS
  Filled 2012-01-08 (×3): qty 50

## 2012-01-08 MED ORDER — POTASSIUM PHOSPHATE DIBASIC 3 MMOLE/ML IV SOLN
30.0000 mmol | Freq: Once | INTRAVENOUS | Status: AC
  Administered 2012-01-08: 30 mmol via INTRAVENOUS
  Filled 2012-01-08: qty 10

## 2012-01-08 MED ORDER — FAT EMULSION 20 % IV EMUL
240.0000 mL | INTRAVENOUS | Status: AC
  Administered 2012-01-08: 240 mL via INTRAVENOUS
  Filled 2012-01-08: qty 250

## 2012-01-08 NOTE — Progress Notes (Signed)
No reports of further melena and HGB up to 11.5. We will be available if you need [place] further otherwise we will sign off.

## 2012-01-08 NOTE — Progress Notes (Signed)
2 Days Post-Op  Subjective: Now awake on the vent Remains hemodynamically stable off pressors  Objective: Vital signs in last 24 hours: Temp:  [99.6 F (37.6 C)-100.8 F (38.2 C)] 100.2 F (37.9 C) (09/20 0400) Pulse Rate:  [88-111] 91  (09/20 0800) Resp:  [20-36] 29  (09/20 0800) BP: (115-154)/(54-86) 126/61 mmHg (09/20 0800) SpO2:  [96 %-100 %] 98 % (09/20 0800) FiO2 (%):  [40 %] 40 % (09/20 0752) Last BM Date: 01/07/12  Intake/Output from previous day: 09/19 0701 - 09/20 0700 In: 3650 [I.V.:1000; Blood:287.5; IV Piggyback:942.5; TPN:1420] Out: 1962 [Urine:1845; Drains:115; Stool:2] Intake/Output this shift:    Abdomen soft, fluid retention, VAC in place, drains with gross purulence  Lab Results:   Basename 01/08/12 0415 01/07/12 2200  WBC 10.5 18.4*  HGB 11.5* 8.1*  HCT 35.9* 25.2*  PLT 277 313   BMET  Basename 01/08/12 0415 01/07/12 1537  NA 142 139  K 3.3* 3.4*  CL 110 108  CO2 24 23  GLUCOSE 146* 250*  BUN 13 13  CREATININE 0.52 0.56  CALCIUM 7.2* 6.9*   PT/INR No results found for this basename: LABPROT:2,INR:2 in the last 72 hours ABG No results found for this basename: PHART:2,PCO2:2,PO2:2,HCO3:2 in the last 72 hours  Studies/Results: Dg Chest Port 1 View  01/08/2012  *RADIOLOGY REPORT*  Clinical Data: Pleural effusion.  PORTABLE CHEST - 1 VIEW  Comparison: Chest 01/06/2012 and 01/07/2012.  Findings: Support tubes and lines are unchanged. Left pleural effusion and left worse than right basilar airspace disease appear unchanged.  No pneumothorax identified.  IMPRESSION: No interval change.   Original Report Authenticated By: Bernadene Bell. Maricela Curet, M.D.    Dg Chest Port 1 View  01/07/2012  *RADIOLOGY REPORT*  Clinical Data: Endotracheal tube placement.  PORTABLE CHEST - 1 VIEW  Comparison: Chest 01/06/2012.  Findings: Endotracheal tube is in place with tip in good position at the level of the clavicular heads.  Feeding tube is no longer visualized.  Right  IJ catheter remains in place.  Left basilar airspace opacity is unchanged.  Right lung appears clear.  No pneumothorax.  IMPRESSION:  1.  ET tube in good position. 2.  No change in left basilar airspace opacity.   Original Report Authenticated By: Bernadene Bell. Maricela Curet, M.D.     Anti-infectives: Anti-infectives     Start     Dose/Rate Route Frequency Ordered Stop   01/03/12 1600   metroNIDAZOLE (FLAGYL) IVPB 500 mg        500 mg 100 mL/hr over 60 Minutes Intravenous Every 8 hours 01/03/12 1525     12/31/11 1100   vancomycin (VANCOCIN) 750 mg in sodium chloride 0.9 % 150 mL IVPB  Status:  Discontinued        750 mg 150 mL/hr over 60 Minutes Intravenous Every 12 hours 12/31/11 0958 01/03/12 0849   12/30/11 1000   piperacillin-tazobactam (ZOSYN) IVPB 3.375 g        3.375 g 12.5 mL/hr over 240 Minutes Intravenous Every 8 hours 12/30/11 0901            Assessment/Plan: s/p Procedure(s) (LRB) with comments: ESOPHAGOGASTRODUODENOSCOPY (EGD) (N/A) - bedside  Will CT abdomen and pelvis to evaluate pancreas and fluid collections  LOS: 13 days    [name] A 01/08/2012

## 2012-01-08 NOTE — Progress Notes (Addendum)
ANTIBIOTIC CONSULT NOTE - FOLLOW UP  Pharmacy Consult for Zosyn Indication:  Pancreatitis, PNA  Allergies  Allergen Reactions  . Ciprofloxacin Nausea Only    REACTION: nausea    Patient Measurements: Height: 5\' 6"  (167.6 cm) Weight: 167 lb 12.3 oz (76.1 kg) IBW/kg (Calculated) : 59.3   Vital Signs: Temp: 98.2 F (36.8 C) (09/20 0800) Temp src: Oral (09/20 0800) BP: 134/66 mmHg (09/20 1000) Pulse Rate: 91  (09/20 1000) Intake/Output from previous day: 09/19 0701 - 09/20 0700 In: 3650 [I.V.:1000; Blood:287.5; IV Piggyback:942.5; TPN:1420] Out: 1962 [Urine:1845; Drains:115; Stool:2] Intake/Output from this shift: Total I/O In: 255 [I.V.:75; TPN:180] Out: 665 [Urine:650; Drains:15]  Labs:  [facility] 01/08/12 0415 01/07/12 2200 01/07/12 1537 01/07/12 0430  WBC 10.5 18.4* -- 17.9*  HGB 11.5* 8.1* -- 6.8*  PLT 277 313 -- 302  LABCREA -- -- -- --  CREATININE 0.52 -- 0.56 0.57   Estimated Creatinine Clearance: 81.8 ml/min (by C-G formula based on Cr of 0.52). No results found for this basename: VANCOTROUGH:2,VANCOPEAK:2,VANCORANDOM:2,GENTTROUGH:2,GENTPEAK:2,GENTRANDOM:2,TOBRATROUGH:2,TOBRAPEAK:2,TOBRARND:2,AMIKACINPEAK:2,AMIKACINTROU:2,AMIKACIN:2, in the last 72 hours   Cultures 9/8 urine >> 45K mult organisms  9/11 blood x 2 >> NG-F 9/12 mrsa pcr >> negative 9/13 urine >> NG-F 9/13 Blood/fungus >> ngtd 9/13 Trach aspirate >> nl flora 9/13 Urine >> NG-F 9/14 CDiff >> Positive 9/15 Abd fluid/paracentesis x 2 >> ngtd 9/16 Blood x 2 >> ngtd  9/16 Urine >> ng-F 9/16 resp/endotrach >> ngtd    Assessment:  55 yof on Day#9 Zosyn for septic shock (ruptured pancreatitic pseudocyst, HCAP vs aspiration), also on Day#5 of Flagyl for C.diff.    WBC improving.  Scr remains stable and wnl (improved from 9/16 and 9/17)  9/17 CXR = Increased bibasilar atelectasis  Plan:   Continue Zosyn 3.375 gm IV q8h  Pharmacy will f/u  Darrol Angel, PharmD Pager:  [phone] 01/08/2012 11:54 AM
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
## 2012-01-08 NOTE — Clinical Social Work Psychosocial (Signed)
Clinical Social Work Department BRIEF PSYCHOSOCIAL ASSESSMENT 01/08/2012  Patient:  [name], [name]     Account Number:  [id number]     Admit date:  12/26/2011  Clinical Social Worker:  Jodelle Red  Date/Time:  01/08/2012 12:15 PM  Referred by:  Physician  Date Referred:  01/05/2012 Referred for  Substance Abuse   Other Referral:   Interview type:  Family Other interview type:   son called csw back    PSYCHOSOCIAL DATA Living Status:  ALONE Admitted from facility:   Level of care:   Primary support name:  Shelsie Pacholski Primary support relationship to patient:  CHILD, ADULT Degree of support available:   adequate    CURRENT CONCERNS Current Concerns  Behavioral Health Issues  Financial Resources  Post-Acute Placement  Substance Abuse   Other Concerns:    SOCIAL WORK ASSESSMENT / PLAN CSW spoke with son over the phone at length. Son, Reuel Boom lives in [place] and last saw his mom Labor Day. He shared a tenous relationship with his mother and shared multiple frustrations about her life. Pt has one son, no other children. Reuel Boom stated his mother missed his wedding in June due to her drinking and illness. Pt has had multiple hospitalizations for drinking and also been at [facility]. Son reports Pt has been counseled multiple times on the relation to her ETOH use and her health with out success. Pt was to start a new job in August. Son reports he cannot handle hospitals and will not come visit.   Assessment/plan status:  Psychosocial Support/Ongoing Assessment of Needs Other assessment/ plan:   placement for SNF  sub abuse counseling   Information/referral to community resources:    PATIENT'S/FAMILY'S RESPONSE TO PLAN OF CARE: Family frustrated, but very appropriate. Son reports Pt has a sister, Deeann Saint who may help her after she gets out of the hospital. Son not warm on idea of being responsible. He agrees to reach out to CSW as needed. CSW to follow closely.      Doreen Salvage, LCSWA ICU/Stepdown Clinical Social Worker [facility] Cell [phone] Hours 8am-1200pm M-F

## 2012-01-08 NOTE — Progress Notes (Signed)
PARENTERAL NUTRITION CONSULT NOTE - FOLLOW UP  Pharmacy Consult for TNA Indication: Severe Pancreatitis  Allergies  Allergen Reactions  . Ciprofloxacin Nausea Only    REACTION: nausea   Patient Measurements: Height: 5\' 6"  (167.6 cm) Weight: 167 lb 12.3 oz (76.1 kg) IBW/kg (Calculated) : 59.3   Vital Signs: Temp: 100.2 F (37.9 C) (09/20 0400) Temp src: Oral (09/20 0400) BP: 122/54 mmHg (09/20 0600) Pulse Rate: 99  (09/20 0600) Intake/Output from previous day: 09/19 0701 - 09/20 0700 In: 3650 [I.V.:1000; Blood:287.5; IV Piggyback:942.5; TPN:1420] Out: 1962 [Urine:1845; Drains:115; Stool:2] Intake/Output from this shift:    Labs:  Basename 01/08/12 0415 01/07/12 2200 01/07/12 0430  WBC 10.5 18.4* 17.9*  HGB 11.5* 8.1* 6.8*  HCT 35.9* 25.2* 21.1*  PLT 277 313 302  APTT -- -- --  INR -- -- --    Basename 01/08/12 0415 01/07/12 1537 01/07/12 0430 01/05/12 1107  NA 142 139 143 --  K 3.3* 3.4* 3.1* --  CL 110 108 112 --  CO2 24 23 24  --  GLUCOSE 146* 250* 199* --  BUN 13 13 15  --  CREATININE 0.52 0.56 0.57 --  LABCREA -- -- -- 69.9  CREAT24HRUR -- -- -- --  CALCIUM 7.2* 6.9* 7.0* --  MG -- -- 2.4 --  PHOS 2.2* -- 1.7* --  PROT -- -- 5.0* --  ALBUMIN -- -- 1.3* --  AST -- -- 432* --  ALT -- -- 575* --  ALKPHOS -- -- 123* --  BILITOT -- -- 0.3 --  BILIDIR -- -- -- --  IBILI -- -- -- --  PREALBUMIN -- -- -- --  TRIG -- -- -- --  CHOLHDL -- -- -- --  CHOL -- -- -- --   Estimated Creatinine Clearance: 81.8 ml/min (by C-G formula based on Cr of 0.52).    Basename 01/08/12 0320 01/07/12 2332 01/07/12 1936  GLUCAP 146* 166* 171*   Insulin Requirements in the past 24 hours:   CBGs 146-227, required 26 units of ICU glycemic control SSI per MD (Novolog 2-6 units Q4h)  Nutritional Goals:   RD recs 9/16: 1762 kcal, 86-95 grams protein  Clinimix 5/20 @ 94ml/hr + IVF 20% MWF will provide 90 g protein/day, 2064 kCal on lipid days, 1584 Kcal on non-lipid days  for an average of 1790 Kcal per day/week.  Current nutrition:   Diet: NPO  IVF: NS @ KVO  Assessment:   [name] admitted 9/8 with epigastric abd pain, fever, chills and N/V. Pt with h/o recurrent pancreatitis and ETOH abuse, s/p ERCP with sphincterotomy on 10/06/2011. Dx with acute pancreatitis this admit, developed ileus. Rapid response called 9/12 for respiratory decline and lethargy - pt transfer to ICU and CCM consulted. Intubated 9/12. 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 Clinical deterioration with rising WBC and findings suggesting GI perforation, underwent exploratory laparotomy with debridement of necrotic pancreas and drainage of infected, ruptured pseudocyst 9/15. TNA started 9/16.   OK from surgery standpoint for feeding tube and tube feeds but EGD 9/18 with ulcer in the cardia of the stomach - continue TNA  Renal/hepatic fxn: Scr wnl, AST/ALT was wnl and bumped to 2252/1087 on 9/17 AM. ?shock liver 2/2 pancreatitis. Now improved to 432/575 on 9/19.  Alk phos trending up as of 9/19. Lytes: low K and phos (despite MD replacement yesterday), correct calcium 9.4 (wnl) Pre-Alb: 2.3 (9/17) TG/Cholesterol: wnl 9/17 CBGs: Most recent values trending  down (218 - 171 - 166 - 146). Continue to monitor trend. No insulin in TNA currently. GI ppx:  Protonix 8mg /hr (67ml/hr)  Plan:  At 1800 tonight  Continue Clinimix E 5/20 @ 60 ml/hr given electrolytes disturbances. - No further advancement until corrected.   TNA to contain IV fat emulsion, standard multivitamins and trace elements only on MWF only due to ongoing shortage   Continue IVF with NS @ KVO   Repeat KPhos IV x1 today (this will provide ~ 40 mEq K+ also).  Check BMET and Phos in AM  Monitor CBGs - continue MD ordered SSI q4h at this time, change if needed  TNA labs Monday/Thursdays  Pharmacy will follow up  daily  Darrol Angel, PharmD Pager: [phone] 01/08/2012 7:44 AM

## 2012-01-08 NOTE — Progress Notes (Signed)
Name: [name] MRN: [id number] DOB: 11-Dec-1955    LOS: 13  Referring Provider:  [name] Reason for Referral:  Acute Respiratory Failure  PULMONARY / CRITICAL CARE MEDICINE  HPI:  [name] is a 56 y.o. female who presented to [facility] ER on 12/26/11 with complaints of epigastric pain (10/10), fever, chills, nausea, vomiting and confusion. The patient has a history of ETOH abuse, but reported that she has not been able to drink more than a few ounces of alcohol due to her pancreatitis. Her last drink was on 09/05. MRCP  10/06/2011 showed dilated common bile duct with biliary sludge. She subsequently underwent an ERCP with sphincterotomy on 10/06/2011.  [facility] admitted the patient, she was fluid resuscitated and placed on prophylactic abx, alcohol withdrawal protocol, and pain management.  Initially the patient's lipase improved from 177 to 126, and her LFTs remained normal.  On 9/12  RRT was called for respiratory decline and lethargy, subsequently required mechanical ventilation & Exploratory laparotomy with debridement of necrotic pancreas and drainage of the upper abdomen.    Events Since Admission: 9/12 RRT and subsequent transfer to the ICU. 9/14 TFs held due to firm abdomen & sub glottic suctioning, WC higher 9/14 increased ascites, Persistent edema/inflammation surrounding the head of the pancreas, with an approximate 1.8 x 2.6 cm pseudocyst at the junction of the head and body; this is unchanged from the examination 3 days ago  and has increased slightly since the June, 2013 examination. Developing pseudocyst in the pancreatic head measuring approximately 1.3 x 2.6 cm, Large hiatal hernia. Nasogastric tube looped within the intrathoracic portion of the stomach. Oral contrast passes to rectum, Gaseous distention of the transverse colon.  9/14 doppler UE neg for DVT  9/15 1.5 L paracentesis - green brown fluid 9/15 Exploratory laparotomy with debridement of necrotic pancreas and  drainage of the upper abdomen 9/18 EGD (Ganem) - 10-12 mm ulcer in the cardia of the stomach, protonix gtt, removed panda   Current Status: Remains Critically ill Low grade fevers   UO improved with lasix Off cont sedation x 72h - awake on WUA   Vital Signs: Temp:  [99.6 F (37.6 C)-100.8 F (38.2 C)] 100.2 F (37.9 C) (09/20 0400) Pulse Rate:  [88-111] 91  (09/20 0800) Resp:  [20-36] 29  (09/20 0800) BP: (115-154)/(54-86) 126/61 mmHg (09/20 0800) SpO2:  [96 %-100 %] 98 % (09/20 0800) FiO2 (%):  [40 %] 40 % (09/20 0752)  Physical Examination: General: chronically ill  appearance, intubated. HEENT: Supple neck, no JVD, no tracheal deviation present.  Head: Normocephalic Mouth/Throat: Intubated Eyes: Conjunctivae are normal. Pupils are equal, round, and reactive to light. No scleral icterus.   Cardiovascular: Tachycardic, regular rhythm, normal heart sounds, S1 S2 and intact distal pulses.  Pulmonary/Chest: Coarse BS diffusely.  Abdominal: Soft but rounded and distended.  Tender to deep palpation. No guarding. Hyperactive bowel sounds. Midline lap incision with 2 JP drains -greenish brown fluid Musculoskeletal: 2+ edema and no tenderness.  Neurological: Opens eyes, follows command, anxious   Principal Problem:  *Acute pancreatitis Active Problems:  Alcohol abuse  Epigastric abdominal pain  Leukocytosis  Hyponatremia  Acute respiratory failure  Shock  ARDS (adult respiratory distress syndrome)   ASSESSMENT AND PLAN  PULMONARY  Lab 01/05/12 0335 01/04/12 0458 01/03/12 2202 01/03/12 1542 01/03/12 0330  PHART 7.462* 7.393 7.366 7.290* 7.449  PCO2ART 31.1* 31.1* 28.2* 32.7* 29.3*  PO2ART 74.8* 65.5* 78.1* 76.5* 59.7*  HCO3 22.0 18.5* 15.8* 15.2* 20.0  O2SAT 95.3  90.8 95.2 92.5 90.4    CXR: 9/17  bibasilar atelectasis & effusions ETT 9/12>>>  A: 1)  Acute Hypoxic respiratory failure w/ respiratory alkalosis,  in setting of bilateral pulmonary infiltrates -  ARDS  2)  Probable PNA HCAP vs Aspiration.       P:   dc'd  ARDS protocol - down to 40%/PEEP 5. ct SBTs -closer to extubation, MV remains high   CARDIOVASCULAR  Lab 01/02/12 1752  TROPONINI --  LATICACIDVEN 1.3  PROBNP --   ECG:   Sinus Tachycardia at 105 Lines:  RIJ 9/13 >>  A: Septic shock     In setting of pancreatitis & ruptured pseudocyst P: Continuous telemetry  RENAL  Lab 01/08/12 0415 01/07/12 1537 01/07/12 0430 01/06/12 0350 01/05/12 0520 01/02/12 0415  NA 142 139 143 143 143 --  K 3.3* 3.4* -- -- -- --  CL 110 108 112 112 112 --  CO2 24 23 24 24 23  --  BUN 13 13 15  24* 41* --  CREATININE 0.52 0.56 0.57 0.72 1.26* --  CALCIUM 7.2* 6.9* 7.0* 6.8* 6.6* --  MG -- -- 2.4 -- 2.5 2.6*  PHOS 2.2* -- 1.7* -- 2.4 4.2   Intake/Output      09/19 0701 - 09/20 0700 09/20 0701 - 09/21 0700   I.V. (mL/kg) 1000 (13.1)    Blood 287.5    IV Piggyback 942.5    TPN 1420    Total Intake(mL/kg) 3650 (48)    Urine (mL/kg/hr) 1845 (1)    Drains 115    Stool 2    Total Output 1962    Net +1688          Foley:  intact  A: Acute renal failure - post op     Hyponatremia: resolved     Hypokalemia  P:  Strict I&O.       Replaced K -phos       Lasix 40 mg q 8h- low CVP but ana sarca, POS 19 L!  GASTROINTESTINAL  Lab 01/07/12 0430 01/05/12 0520 01/03/12 0550 01/02/12 1752 01/02/12 0415  AST 432* 2252* 46* 29 45*  ALT 575* 1087* 9 9 10   ALKPHOS 123* 81 137* 95 122*  BILITOT 0.3 0.4 0.4 0.5 0.4  PROT 5.0* 4.6* 6.0 5.5* 5.9*  ALBUMIN 1.3* 1.4* 1.6* 1.5* 1.6*   Lipase     Component Value Date/Time   LIPASE 49 01/03/2012 0550   A:  Acute Pancreatitis - lipase levels  improved, liver function test normal       Protein calorie malnutrition       Guiaic pos -NG dc'd due to gastric ulcer       Shock Liver -improving P: protonix gtt -change to q 12h     Will need post pyloric panda eventually for TFs -(unable to place by IR )     ct TNA       HEMATOLOGIC  Lab  01/08/12 0415 01/07/12 2200 01/07/12 0430 01/06/12 0350 01/05/12 0520 01/02/12 1752  HGB 11.5* 8.1* 6.8* 7.3* 7.7* --  HCT 35.9* 25.2* 21.1* 23.0* 23.6* --  PLT 277 313 302 359 313 --  INR -- -- -- -- -- 1.31  APTT -- -- -- -- -- 34   A:  Mild Anemia - likely hemodilution from fluid administration P:  Recheck CBC      Transfuse 1U - goal  HGB 7     Hold sq lovenox due to hb drop/ gastric ulcer ,  use SCDs for dvt proph -high risk   INFECTIOUS  Lab 01/08/12 0415 01/07/12 2200 01/07/12 0430 01/06/12 0350 01/05/12 0520 01/03/12 0550  WBC 10.5 18.4* 17.9* 17.5* 17.0* --  PROCALCITON -- -- -- -- -- 2.69   Cultures: Blood - 9/11>>>ng Urine - 9/8 - Multiple bacterial morphotypes c diff pcr 9/14 >> POS 9/15 ascites >>ng  Antibiotics: Zosyn 9/11>>> Vanc 9/12>>>9/15 Flagyl 9/15 >>  A: Septic shock -resolved     UTI - positive for leuks, and nitrites, cultures likely contaminated     C diff colitis     Peritonitis  P:   Continue empiric zosyn, low threshold to add fungal coverage but improved WC       Ct Flagyl        Concern for purulent drainage - d/w surgery, CT abdomen /pelvis planned 9/20  ENDOCRINE CBG (last 3)   Basename 01/08/12 0804 01/08/12 0320 01/07/12 2332  GLUCAP 149* 146* 166*   A: No issues currently   P: CBG Q4H r/t high risk for hyperglycemia  NEUROLOGIC A: Alcohol withdrawal/ acute encephalopathy -resolved, ammonia nml     Pain r/t acute pancreatitis     Chronic narcotic dependence  P: dc  Versed , int fentanyl           Care during the described time interval was provided by me and/or other providers on the critical care team.  I have reviewed this patient's available data, including medical history, events of note, physical examination and test results as part of my evaluation  CC time x  40 m  Cyril Mourning MD. [facility]. [place] [facility] Pager [phone] If no response call [phone]

## 2012-01-09 ENCOUNTER — Inpatient Hospital Stay (HOSPITAL_COMMUNITY): Payer: MEDICAID

## 2012-01-09 LAB — COMPREHENSIVE METABOLIC PANEL
Albumin: 1.4 g/dL — ABNORMAL LOW (ref 3.5–5.2)
Calcium: 7.4 mg/dL — ABNORMAL LOW (ref 8.4–10.5)
GFR calc Af Amer: >90 mL/min (ref >90)
GFR calc non Af Amer: >90 mL/min (ref >90)
Potassium: 2.9 mEq/L — ABNORMAL LOW (ref 3.5–5.1)
Sodium: 135 mEq/L (ref 135–145)
Total Bilirubin: 0.3 mg/dL (ref 0.3–1.2)
Total Protein: 5.4 g/dL — ABNORMAL LOW (ref 6.0–8.3)

## 2012-01-09 LAB — CBC
Hemoglobin: 8 g/dL — ABNORMAL LOW (ref 12.0–15.0)
MCHC: 33.1 g/dL (ref 30.0–36.0)
Platelets: 338 10*3/uL (ref 150–400)
RDW: 16.6 % — ABNORMAL HIGH (ref 11.5–15.5)

## 2012-01-09 LAB — GLUCOSE, CAPILLARY
Glucose-Capillary: 161 mg/dL — ABNORMAL HIGH (ref 70–99)
Glucose-Capillary: 156 mg/dL — ABNORMAL HIGH (ref 70–99)
Glucose-Capillary: 131 mg/dL — ABNORMAL HIGH (ref 70–99)

## 2012-01-09 LAB — PHOSPHORUS: Phosphorus: 2.9 mg/dL (ref 2.3–4.6)

## 2012-01-09 MED ORDER — CLINIMIX E/DEXTROSE (5/20) 5 % IV SOLN
INTRAVENOUS | Status: AC
  Administered 2012-01-09: 17:00:00 via INTRAVENOUS
  Filled 2012-01-09: qty 2000

## 2012-01-09 MED ORDER — POTASSIUM CHLORIDE 10 MEQ/50ML IV SOLN
10.0000 meq | INTRAVENOUS | Status: AC
  Administered 2012-01-09 (×4): 10 meq via INTRAVENOUS
  Filled 2012-01-09: qty 200

## 2012-01-09 MED ORDER — POTASSIUM CHLORIDE 10 MEQ/50ML IV SOLN
10.0000 meq | INTRAVENOUS | Status: AC
  Administered 2012-01-09 (×2): 10 meq via INTRAVENOUS
  Filled 2012-01-09: qty 100

## 2012-01-09 MED ORDER — FENTANYL CITRATE 0.05 MG/ML IJ SOLN
100.0000 ug | INTRAMUSCULAR | Status: DC | PRN
  Administered 2012-01-09: 200 ug via INTRAVENOUS
  Administered 2012-01-09: 100 ug via INTRAVENOUS
  Administered 2012-01-09: 200 ug via INTRAVENOUS
  Administered 2012-01-10: 100 ug via INTRAVENOUS
  Administered 2012-01-10 (×2): 200 ug via INTRAVENOUS
  Administered 2012-01-10: 100 ug via INTRAVENOUS
  Filled 2012-01-09: qty 2
  Filled 2012-01-09 (×2): qty 4
  Filled 2012-01-09: qty 2
  Filled 2012-01-09: qty 4
  Filled 2012-01-09 (×2): qty 2
  Filled 2012-01-09 (×2): qty 4

## 2012-01-09 NOTE — Progress Notes (Signed)
3 Days Post-Op  Subjective: No issues overnight.    Objective: Vital signs in last 24 hours: Temp:  [98.2 F (36.8 C)-99.8 F (37.7 C)] 98.5 F (36.9 C) (09/21 0400) Pulse Rate:  [81-93] 81  (09/21 0600) Resp:  [19-35] 22  (09/21 0600) BP: (92-144)/(47-119) 92/47 mmHg (09/21 0600) SpO2:  [93 %-99 %] 97 % (09/21 0600) FiO2 (%):  [40 %] 40 % (09/21 0738) Last BM Date: 01/07/12  Intake/Output from previous day: 09/20 0701 - 09/21 0700 In: 2763 [I.V.:265; IV Piggyback:1118; TPN:1380] Out: 7356 [Urine:7270; Drains:85; Stool:1] Intake/Output this shift:    General appearance: alert, cooperative, no distress and intubated but responds appropriately Resp: clear to auscultation bilaterally and on PS 10 with good tidal volumes of about 530 Cardio: normal rate, regular GI: soft, mild distension, wound vac in midline, JP's with thick, milky output, no peritonitis Extremities: SCD's bilaterally  Lab Results:   Basename 01/09/12 0510 01/08/12 0415  WBC 13.7* 10.5  HGB 8.0* 11.5*  HCT 24.2* 35.9*  PLT 338 277   BMET  Basename 01/09/12 0510 01/08/12 1620  NA 135 137  K 2.9* 3.3*  CL 101 102  CO2 26 27  GLUCOSE 160* 185*  BUN 13 12  CREATININE 0.52 0.56  CALCIUM 7.4* 7.3*   PT/INR No results found for this basename: LABPROT:2,INR:2 in the last 72 hours ABG No results found for this basename: PHART:2,PCO2:2,PO2:2,HCO3:2 in the last 72 hours  Studies/Results: Ct Abdomen Pelvis W Contrast  01/08/2012  *RADIOLOGY REPORT*  Clinical Data: Follow-up pancreatitis and abdominal and pelvic fluid collections.  CT ABDOMEN AND PELVIS WITH CONTRAST  Technique:  Multidetector CT imaging of the abdomen and pelvis was performed following the standard protocol during bolus administration of intravenous contrast.  Contrast: OMNIPAQUE IOHEXOL 300 MG/ML  SOLN  Comparison: 09/14/2013and 12/27/2011  Findings: Mild increase in size of small bilateral pleural effusions is seen.  Intraperitoneal  drainage catheters are now seen in place, and decreased ascites is seen within the abdomen pelvis since prior exam.  Foley catheter again seen within the bladder which is collapsed.  A small pseudocyst is again seen in the pancreatic head.  This shows mild enlargement compared to recent study 6 days ago, currently measuring 2.3 x 2.4 cm, but is significantly decreased in size compared to earlier exam on 12/27/2011. No other loculated peri pancreatic fluid collections are identified. There is no evidence of distal pancreatic atrophy or pancreatic ductal dilatation.  Prior cholecystectomy noted.  No evidence of biliary ductal dilatation.  Hepatic steatosis is again demonstrated, without evidence of focal liver mass.  The spleen, adrenal glands, and kidneys are normal in appearance.  No evidence of lymphadenopathy.  Uterus and adnexa are unremarkable.  Sigmoid diverticulosis is noted, however there is no evidence of diverticulitis.  No evidence of bowel obstruction. Diffuse body wall edema again noted. Moderate hiatal hernia again seen.  IMPRESSION:  1.  Decrease in moderate abdominal and pelvic ascites with intraperitoneal drainage catheters now seen in place.  Mild increase in small bilateral pleural effusions. No significant change in diffuse body wall edema. 2.  Persistent small pseudocyst in the area the pancreatic head, which is decreased in size since earlier exam on 12/27/2011. Continued follow-up by CT is recommended. 3.  Ancillary findings again noted including moderate hiatal hernia, hepatic steatosis, and sigmoid diverticulosis.   Original Report Authenticated By: Danae Orleans, M.D.    Dg Chest Port 1 View  01/08/2012  *RADIOLOGY REPORT*  Clinical Data: Pleural  effusion.  PORTABLE CHEST - 1 VIEW  Comparison: Chest 01/06/2012 and 01/07/2012.  Findings: Support tubes and lines are unchanged. Left pleural effusion and left worse than right basilar airspace disease appear unchanged.  No pneumothorax  identified.  IMPRESSION: No interval change.   Original Report Authenticated By: Bernadene Bell. Maricela Curet, M.D.     Anti-infectives: Anti-infectives     Start     Dose/Rate Route Frequency Ordered Stop   01/03/12 1600   metroNIDAZOLE (FLAGYL) IVPB 500 mg        500 mg 100 mL/hr over 60 Minutes Intravenous Every 8 hours 01/03/12 1525     12/31/11 1100   vancomycin (VANCOCIN) 750 mg in sodium chloride 0.9 % 150 mL IVPB  Status:  Discontinued        750 mg 150 mL/hr over 60 Minutes Intravenous Every 12 hours 12/31/11 0958 01/03/12 0849   12/30/11 1000   piperacillin-tazobactam (ZOSYN) IVPB 3.375 g        3.375 g 12.5 mL/hr over 240 Minutes Intravenous Every 8 hours 12/30/11 0901            Assessment/Plan: s/p Procedure(s) (LRB) with comments: ESOPHAGOGASTRODUODENOSCOPY (EGD) (N/A) - bedside Neuro appropriate Lungs clear, continue vent wean HD stable but HGB down to 8 today.  Monitor this and transfuse prn Abdomen appropriate.  Continue drainage.  Continue TPN No new recs  LOS: 14 days    Lodema Pilot DAVID 01/09/2012

## 2012-01-09 NOTE — Progress Notes (Signed)
PARENTERAL NUTRITION CONSULT NOTE - FOLLOW UP  Pharmacy Consult for TNA Indication: Severe Pancreatitis  Allergies  Allergen Reactions  . Ciprofloxacin Nausea Only    REACTION: nausea   Patient Measurements: Height: 5\' 6"  (167.6 cm) Weight: 167 lb 12.3 oz (76.1 kg) IBW/kg (Calculated) : 59.3   Vital Signs: Temp: 98.5 F (36.9 C) (09/21 0400) Temp src: Oral (09/21 0400) BP: 124/58 mmHg (09/21 0400) Pulse Rate: 90  (09/21 0400) Intake/Output from previous day: 09/20 0701 - 09/21 0700 In: 2763 [I.V.:265; IV Piggyback:1118; TPN:1380] Out: 7356 [Urine:7270; Drains:85; Stool:1] Intake/Output from this shift:    Labs:  Basename 01/09/12 0510 01/08/12 0415 01/07/12 2200  WBC 13.7* 10.5 18.4*  HGB 8.0* 11.5* 8.1*  HCT 24.2* 35.9* 25.2*  PLT 338 277 313  APTT -- -- --  INR -- -- --    Basename 01/09/12 0510 01/08/12 1620 01/08/12 0415 01/07/12 0430  NA 135 137 142 --  K 2.9* 3.3* 3.3* --  CL 101 102 110 --  CO2 26 27 24  --  GLUCOSE 160* 185* 146* --  BUN 13 12 13  --  CREATININE 0.52 0.56 0.52 --  LABCREA -- -- -- --  CREAT24HRUR -- -- -- --  CALCIUM 7.4* 7.3* 7.2* --  MG -- -- -- 2.4  PHOS 2.9 -- 2.2* 1.7*  PROT 5.4* -- -- 5.0*  ALBUMIN 1.4* -- -- 1.3*  AST 55* -- -- 432*  ALT 191* -- -- 575*  ALKPHOS 95 -- -- 123*  BILITOT 0.3 -- -- 0.3  BILIDIR -- -- -- --  IBILI -- -- -- --  PREALBUMIN -- -- -- --  TRIG -- -- -- --  CHOLHDL -- -- -- --  CHOL -- -- -- --   Estimated Creatinine Clearance: 81.8 ml/min (by C-G formula based on Cr of 0.52).    Basename 01/09/12 0401 01/08/12 2338 01/08/12 1931  GLUCAP 140* 161* 141*   Insulin Requirements in the past 24 hours:   CBGs 140-205, required 20 units of ICU glycemic control SSI per MD (Novolog 2-6 units Q4h)  Nutritional Goals:   RD recs 9/16: 1762 kcal, 86-95 grams protein  Clinimix 5/20 @ 32ml/hr + IVF 20% MWF will provide 90 g protein/day, 2064 kCal on lipid days, 1584 Kcal on non-lipid days for an  average of 1790 Kcal per day/week.  Current nutrition:   Diet: NPO  IVF: NS @ KVO  Assessment:   [name] admitted 9/8 with epigastric abd pain, fever, chills and N/V. Pt with h/o recurrent pancreatitis and ETOH abuse, s/p ERCP with sphincterotomy on 10/06/2011. Dx with acute pancreatitis this admit, developed ileus. Rapid response called 9/12 for respiratory decline and lethargy - pt transfer to ICU and CCM consulted. Intubated 9/12. 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 Clinical deterioration with rising WBC and findings suggesting GI perforation, underwent exploratory laparotomy with debridement of necrotic pancreas and drainage of infected, ruptured pseudocyst 9/15. TNA started 9/16.   OK from surgery standpoint for feeding tube and tube feeds but EGD 9/18 with ulcer in the cardia of the stomach - continue TNA  Renal/hepatic fxn: Scr wnl, AST/ALT was wnl but bumped to 2252/1087 on 9/17 AM. ?shock liver 2/2 pancreatitis. Now continues to improve and trend down.  Alk phos now wnl. Lytes: K+ lower this am (2.9). Has been started on Lasix 40mg  IV q8h x 4 doses. 40 mEq KCL IV has been ordered for this am.  Will order an additional 20 mEq of IV KCl (further K+ supplementation by MD as PharmD is limited to ordering KCl total per day per TNA protocol). Phos now wnl. correct calcium 9.5 (wnl) Pre-Alb: 2.3 (9/17) TG/Cholesterol: wnl 9/17 CBGs: Continuing to trend down. Goal <150. Continue to monitor trend. No insulin in TNA currently. GI ppx:  Protonix 40mg  IV q12h  Plan:  At 1800 tonight  Continue Clinimix E 5/20 @ 60 ml/hr given persistent hypokalemia. - No further advancement until corrected.   TNA to contain IV fat emulsion, standard multivitamins and trace elements only on MWF only due to ongoing shortage   Continue IVF with NS @ KVO   Give an additional 20 mEq IV KCl this am (total 60 mEq  counting MD's order from earlier this am).  Check BMET, Mg, and Phos in AM  Monitor CBGs - continue MD ordered SSI q4h at this time, change if needed  TNA labs Monday/Thursdays  Pharmacy will follow up daily  Darrol Angel, PharmD Pager: [phone] 01/09/2012 7:17 AM

## 2012-01-09 NOTE — Progress Notes (Signed)
Weaned Pt on 40% t bar per Dr.Wert for 30 minutes.  After 30 minutes her rate was in the 30s and she was using her accessory muscles.  I placed her back on full support on the ventilator.

## 2012-01-09 NOTE — Progress Notes (Signed)
Hypokalemia   K replaced  

## 2012-01-09 NOTE — Progress Notes (Signed)
Name: [name] MRN: [id number] DOB: Mar 22, 1956    LOS: 14  Referring Provider:  [name] Reason for Referral:  Acute Respiratory Failure  PULMONARY / CRITICAL CARE MEDICINE  Brief patient profile:  55 yowf  to [facility] ER on 09/07 with complaints of epigastric pain (10/10), fever, chills, nausea, vomiting and confusion with hx ETOH abuse >Her last drink was on 09/05. MRCP  10/06/2011 showed dilated common bile duct with biliary sludge>  ERCP with sphincterotomy on 10/06/2011.  [facility] admitted the patient, she was fluid resuscitated and placed on prophylactic abx, alcohol withdrawal protocol, and pain management.  Initially the patient's lipase improved from 177 to 126, and her LFTs remained normal.  On 9/12  RRT was called for respiratory decline and lethargy, subsequently required mechanical ventilation & Exploratory laparotomy 9/15  with debridement of necrotic pancreas and drainage.   Events Since Admission: 9/12 RRT and subsequent transfer to the ICU. 9/14 TFs held due to firm abdomen & sub glottic suctioning, WC higher 9/14 increased ascites, Persistent edema/inflammation surrounding the head of the pancreas, with an approximate 1.8 x 2.6 cm pseudocyst at the junction of the head and body; this is unchanged from the examination 3 days ago  and has increased slightly since the June, 2013 examination. Developing pseudocyst in the pancreatic head measuring approximately 1.3 x 2.6 cm, Large hiatal hernia. Nasogastric tube looped within the intrathoracic portion of the stomach. Oral contrast passes to rectum, Gaseous distention of the transverse colon.  9/14 doppler UE neg for DVT  9/15 1.5 L paracentesis - green brown fluid 9/15 Exploratory laparotomy with debridement of necrotic pancreas and drainage of the upper abdomen 9/18 EGD (Ganem) - 10-12 mm ulcer in the cardia of the stomach, protonix gtt, removed panda   Current Status: Remains Critically ill Low grade fevers  awake on  WUA   Vital Signs: Temp:  [98.5 F (36.9 C)-100.3 F (37.9 C)] 100.3 F (37.9 C) (09/21 0800) Pulse Rate:  [81-93] 88  (09/21 0800) Resp:  [19-35] 21  (09/21 0800) BP: (92-144)/(47-119) 108/66 mmHg (09/21 0800) SpO2:  [93 %-99 %] 96 % (09/21 0800) FiO2 (%):  [40 %] 40 % (09/21 0800)  Physical Examination: General: chronically ill  appearance, intubated. HEENT: Supple neck, no JVD, no tracheal deviation present.  Head: Normocephalic Mouth/Throat: Intubated Cardiovascular:  regular rhythm, normal heart sounds, S1 S2 and intact distal pulses.  Pulmonary/Chest: minimal rhonchi Abdominal: Soft  Mod  Tender to deep palpation. No guarding. Hyperactive bowel sounds. Midline lap incision with 2 JP drains -greenish brown fluid Musculoskeletal: 1+ edema and no tenderness.  Neurological: Opens eyes, follows command, anxious   Principal Problem:  *Acute pancreatitis Active Problems:  Alcohol abuse  Epigastric abdominal pain  Leukocytosis  Hyponatremia  Acute respiratory failure  Shock  ARDS (adult respiratory distress syndrome)   ASSESSMENT AND PLAN  PULMONARY  Lab 01/05/12 0335 01/04/12 0458 01/03/12 2202 01/03/12 1542 01/03/12 0330  PHART 7.462* 7.393 7.366 7.290* 7.449  PCO2ART 31.1* 31.1* 28.2* 32.7* 29.3*  PO2ART 74.8* 65.5* 78.1* 76.5* 59.7*  HCO3 22.0 18.5* 15.8* 15.2* 20.0  O2SAT 95.3 90.8 95.2 92.5 90.4   ETT 9/12>>>  CXR: 9/21 1. Stable appearance of bibasilar airspace opacity and low lung  volumes. Support apparatus unchanged.    A: 1)  Acute Hypoxic respiratory failure w/ mild respiratory alkalosis,  in setting of bilateral pulmonary infiltrates - ARDS resolved  2)  Probable PNA HCAP vs Aspiration.       P:  ct SBTs -closer to extubation, MV remains high   CARDIOVASCULAR  Lab 01/02/12 1752  TROPONINI --  LATICACIDVEN 1.3  PROBNP --   ECG:   Sinus Tachycardia at 105 Lines:  RIJ 9/13 >>  A: Septic shock, resolved     In setting of pancreatitis  & ruptured pseudocyst   RENAL  Lab 01/09/12 0510 01/08/12 1620 01/08/12 0415 01/07/12 1537 01/07/12 0430 01/05/12 0520  NA 135 137 142 139 143 --  K 2.9* 3.3* -- -- -- --  CL 101 102 110 108 112 --  CO2 26 27 24 23 24  --  BUN 13 12 13 13 15  --  CREATININE 0.52 0.56 0.52 0.56 0.57 --  CALCIUM 7.4* 7.3* 7.2* 6.9* 7.0* --  MG -- -- -- -- 2.4 2.5  PHOS 2.9 -- 2.2* -- 1.7* 2.4   Intake/Output      09/20 0701 - 09/21 0700 09/21 0701 - 09/22 0700   I.V. (mL/kg) 275 (3.6) 10 (0.1)   Blood     IV Piggyback 1118 200   TPN 1450 140   Total Intake(mL/kg) 2843 (37.4) 350 (4.6)   Urine (mL/kg/hr) 7270 (4) 1150   Drains 85 60   Stool 1    Total Output 7356 1210   Net -4513 -860         CVP:  [5 mmHg] 5 mmHg   Foley:  intact  A: Acute renal failure, resolved     Hyponatremia: resolved     Hypokalemia  P:  Continue to keep negative fluid balance as tolerats.     GASTROINTESTINAL  Lab 01/09/12 0510 01/07/12 0430 01/05/12 0520 01/03/12 0550 01/02/12 1752  AST 55* 432* 2252* 46* 29  ALT 191* 575* 1087* 9 9  ALKPHOS 95 123* 81 137* 95  BILITOT 0.3 0.3 0.4 0.4 0.5  PROT 5.4* 5.0* 4.6* 6.0 5.5*  ALBUMIN 1.4* 1.3* 1.4* 1.6* 1.5*   Lipase     Component Value Date/Time   LIPASE 49 01/03/2012 0550   CT abd 9/20 1. Decrease in moderate abdominal and pelvic ascites with  intraperitoneal drainage catheters now seen in place. Mild  increase in small bilateral pleural effusions. No significant  change in diffuse body wall edema.  2. Persistent small pseudocyst in the area the pancreatic head,  which is decreased in size since earlier exam on 12/27/2011.  Continued follow-up by CT is recommended.  3. Ancillary findings again noted including moderate hiatal  hernia, hepatic steatosis, and sigmoid diverticulosis.  A:  Acute Pancreatitis - lipase levels  improved, liver function test normal       Protein calorie malnutrition       Guiaic pos -NG dc'd due to gastric ulcer       Shock  Liver -improving P: protonix gtt -change to q 12h     Will need post pyloric panda eventually for TFs -(unable to place by IR )     ct TNA       HEMATOLOGIC  Lab 01/09/12 0510 01/08/12 0415 01/07/12 2200 01/07/12 0430 01/06/12 0350 01/02/12 1752  HGB 8.0* 11.5* 8.1* 6.8* 7.3* --  HCT 24.2* 35.9* 25.2* 21.1* 23.0* --  PLT 338 277 313 302 359 --  INR -- -- -- -- -- 1.31  APTT -- -- -- -- -- 34   A:  Mild Anemia - likely hemodilution from fluid administration P:    Holding sq lovenox due to hb drop/ gastric ulcer , use SCDs for dvt proph -high risk  INFECTIOUS  Lab 01/09/12 0510 01/08/12 0415 01/07/12 2200 01/07/12 0430 01/06/12 0350 01/03/12 0550  WBC 13.7* 10.5 18.4* 17.9* 17.5* --  PROCALCITON -- -- -- -- -- 2.69   Cultures: Blood - 9/11>>>ng Urine - 9/8 - Multiple bacterial morphotypes c diff pcr 9/14 >> POS  9/15 ascites >>ng  Antibiotics: Zosyn 9/11>>> Vanc 9/12>>>9/15 Flagyl 9/15 >>  A: Septic shock -resolved     UTI - positive for leuks, and nitrites, cultures likely contaminated     C diff colitis     Peritonitis  P:   Continue empiric zosyn, low threshold to add fungal coverage but improved WC       Ct Flagyl        Abd drains per CCS  ENDOCRINE CBG (last 3)   Basename 01/09/12 0840 01/09/12 0401 01/08/12 2338  GLUCAP 183* 140* 161*   A: No issues currently   P: CBG Q4H r/t high risk for hyperglycemia  NEUROLOGIC A: Alcohol withdrawal/ acute encephalopathy -resolved, ammonia nml     Pain r/t acute pancreatitis     Chronic narcotic dependence  P: dc  Versed , int fentanyl         She is anxious to have vent out and clear on exam so main issue is amt of pain meds/anxiolytics needed at this point > try wall tbar and extubate if tolerates cvp 5 and hard to keep up with K so hold lasix for now  Sandrea Hughs, MD Pulmonary and Critical Care Medicine [facility] Cell [phone]

## 2012-01-10 DIAGNOSIS — A0472 Enterocolitis due to Clostridium difficile, not specified as recurrent: Secondary | ICD-10-CM | POA: Diagnosis not present

## 2012-01-10 LAB — GLUCOSE, CAPILLARY
Glucose-Capillary: 127 mg/dL — ABNORMAL HIGH (ref 70–99)
Glucose-Capillary: 136 mg/dL — ABNORMAL HIGH (ref 70–99)
Glucose-Capillary: 144 mg/dL — ABNORMAL HIGH (ref 70–99)
Glucose-Capillary: 154 mg/dL — ABNORMAL HIGH (ref 70–99)

## 2012-01-10 LAB — BASIC METABOLIC PANEL
BUN: 15 mg/dL (ref 6–23)
Calcium: 7.5 mg/dL — ABNORMAL LOW (ref 8.4–10.5)
Creatinine, Ser: 0.5 mg/dL (ref 0.50–1.10)
GFR calc non Af Amer: >90 mL/min (ref >90)
Glucose, Bld: 135 mg/dL — ABNORMAL HIGH (ref 70–99)
Potassium: 3.4 mEq/L — ABNORMAL LOW (ref 3.5–5.1)

## 2012-01-10 LAB — CBC
MCV: 88 fL (ref 78.0–100.0)
Platelets: 478 10*3/uL — ABNORMAL HIGH (ref 150–400)
RDW: 17 % — ABNORMAL HIGH (ref 11.5–15.5)
WBC: 10.4 10*3/uL (ref 4.0–10.5)

## 2012-01-10 LAB — CULTURE, BLOOD (ROUTINE X 2): Culture: NO GROWTH

## 2012-01-10 LAB — FUNGUS CULTURE, BLOOD: Culture: NO GROWTH

## 2012-01-10 MED ORDER — FENTANYL BOLUS VIA INFUSION
50.0000 ug | Freq: Four times a day (QID) | INTRAVENOUS | Status: DC | PRN
  Filled 2012-01-10: qty 100

## 2012-01-10 MED ORDER — FUROSEMIDE 10 MG/ML IJ SOLN
40.0000 mg | Freq: Once | INTRAMUSCULAR | Status: AC
  Administered 2012-01-10: 40 mg via INTRAVENOUS
  Filled 2012-01-10: qty 4

## 2012-01-10 MED ORDER — FENTANYL CITRATE 0.05 MG/ML IJ SOLN
100.0000 ug | INTRAMUSCULAR | Status: DC | PRN
  Administered 2012-01-10: 100 ug via INTRAVENOUS
  Administered 2012-01-10: 200 ug via INTRAVENOUS
  Administered 2012-01-10 – 2012-01-11 (×6): 100 ug via INTRAVENOUS
  Filled 2012-01-10 (×7): qty 2

## 2012-01-10 MED ORDER — CLINIMIX E/DEXTROSE (5/20) 5 % IV SOLN
INTRAVENOUS | Status: AC
  Administered 2012-01-10: 17:00:00 via INTRAVENOUS
  Filled 2012-01-10 (×2): qty 2000

## 2012-01-10 MED ORDER — POTASSIUM CHLORIDE 10 MEQ/50ML IV SOLN
10.0000 meq | INTRAVENOUS | Status: AC
  Administered 2012-01-10 (×4): 10 meq via INTRAVENOUS
  Filled 2012-01-10: qty 200

## 2012-01-10 MED ORDER — SODIUM CHLORIDE 0.9 % IV SOLN
50.0000 ug/h | INTRAVENOUS | Status: DC
  Administered 2012-01-10: 100 ug/h via INTRAVENOUS
  Filled 2012-01-10: qty 50

## 2012-01-10 MED ORDER — LORAZEPAM 2 MG/ML IJ SOLN
1.0000 mg | Freq: Four times a day (QID) | INTRAMUSCULAR | Status: DC
  Administered 2012-01-10 – 2012-01-11 (×5): 1 mg via INTRAVENOUS
  Filled 2012-01-10 (×5): qty 1

## 2012-01-10 NOTE — Progress Notes (Signed)
Name: [name] MRN: [id number] DOB: 1955/04/25    LOS: 15  Referring Provider:  [name] Reason for Referral:  Acute Respiratory Failure  PULMONARY / CRITICAL CARE MEDICINE  Brief patient profile:  55 yowf  to [facility] ER on 09/07 with complaints of epigastric pain  fever, chills, nausea, vomiting and confusion with hx ETOH abuse >Her last drink was on 09/05. MRCP  10/06/2011 showed dilated common bile duct with biliary sludge>  ERCP with sphincterotomy on 10/06/2011.  [facility] admitted the patient, she was fluid resuscitated and placed on prophylactic abx, alcohol withdrawal protocol, and pain management.  Initially the patient's lipase improved from 177 to 126, and her LFTs remained normal.  On 9/12  RRT was called for respiratory decline and lethargy, subsequently required mechanical ventilation & Exploratory laparotomy 9/15  with debridement of necrotic pancreas and drainage.   Events Since Admission: 9/12 RRT and subsequent transfer to the ICU. 9/14 TFs held due to firm abdomen & sub glottic suctioning, WC higher 9/14 increased ascites, Persistent edema/inflammation surrounding the head of the pancreas, with an approximate 1.8 x 2.6 cm pseudocyst at the junction of the head and body; this is unchanged from the examination 3 days ago  and has increased slightly since the June, 2013 examination. Developing pseudocyst in the pancreatic head measuring approximately 1.3 x 2.6 cm, Large hiatal hernia. Nasogastric tube looped within the intrathoracic portion of the stomach. Oral contrast passes to rectum, Gaseous distention of the transverse colon.  9/14 doppler UE neg for DVT  9/15 1.5 L paracentesis - green brown fluid 9/15 Exploratory laparotomy with debridement of necrotic pancreas and drainage of the upper abdomen 9/18 EGD (Ganem) - 10-12 mm ulcer in the cardia of the stomach, protonix gtt, removed panda   Current Status: Remains Critically ill   awake on WUA c/o lots of abd  pain   Vital Signs: Temp:  [99 F (37.2 C)-100.3 F (37.9 C)] 99 F (37.2 C) (09/22 0400) Pulse Rate:  [76-93] 83  (09/22 0600) Resp:  [17-26] 20  (09/22 0600) BP: (92-120)/(47-81) 98/54 mmHg (09/22 0600) SpO2:  [96 %-100 %] 99 % (09/22 0600) FiO2 (%):  [40 %] 40 % (09/22 0600)  Physical Examination: General: chronically ill  appearance, intubated. HEENT: Supple neck, no JVD, no tracheal deviation present.  Head: Normocephalic Mouth/Throat: Intubated Cardiovascular:  regular rhythm, normal heart sounds, S1 S2 and intact distal pulses.  Pulmonary/Chest: minimal rhonchi Abdominal: Soft  Mod  Tender to deep palpation. No guarding. Hyperactive bowel sounds. Midline lap incision with 2 JP drains -greenish brown fluid Musculoskeletal: 1+ edema and no tenderness.  Neurological: Opens eyes, follows command, anxious   Principal Problem:  *Acute pancreatitis Active Problems:  Alcohol abuse  Epigastric abdominal pain  Leukocytosis  Hyponatremia  Acute respiratory failure  Shock  ARDS (adult respiratory distress syndrome)   ASSESSMENT AND PLAN  PULMONARY  Lab 01/05/12 0335 01/04/12 0458 01/03/12 2202 01/03/12 1542  PHART 7.462* 7.393 7.366 7.290*  PCO2ART 31.1* 31.1* 28.2* 32.7*  PO2ART 74.8* 65.5* 78.1* 76.5*  HCO3 22.0 18.5* 15.8* 15.2*  O2SAT 95.3 90.8 95.2 92.5   ETT 9/12>>>  CXR: 9/21 1. Stable appearance of bibasilar airspace opacity and low lung  volumes. Support apparatus unchanged.    A: 1)  Acute Hypoxic respiratory failure w/ mild respiratory alkalosis,  in setting of bilateral pulmonary infiltrates - ARDS resolved  2)  Probable PNA HCAP vs Aspiration.       P:   ct SBTs -closer to  extubation, MV remains high and Cabd very low, pain control issues and anxiety all contributing to failure to wean effectively   CARDIOVASCULAR No results found for this basename: TROPONINI:5,LATICACIDVEN:5, O2SATVEN:5,PROBNP:5 in the last 168 hours ECG:   Sinus Tachycardia  at 105 Lines:  RIJ 9/13 >>  A: Septic shock, resolved     In setting of pancreatitis & ruptured pseudocyst   RENAL  Lab 01/10/12 0500 01/09/12 0510 01/08/12 1620 01/08/12 0415 01/07/12 1537 01/07/12 0430 01/05/12 0520  NA 135 135 137 142 139 -- --  K 3.4* 2.9* -- -- -- -- --  CL 103 101 102 110 108 -- --  CO2 25 26 27 24 23  -- --  BUN 15 13 12 13 13  -- --  CREATININE 0.50 0.52 0.56 0.52 0.56 -- --  CALCIUM 7.5* 7.4* 7.3* 7.2* 6.9* -- --  MG 1.8 -- -- -- -- 2.4 2.5  PHOS 2.9 2.9 -- 2.2* -- 1.7* 2.4   Intake/Output      09/21 0701 - 09/22 0700 09/22 0701 - 09/23 0700   I.V. (mL/kg) 70 (0.9)    Other 24    IV Piggyback 762.5    TPN 1370    Total Intake(mL/kg) 2226.5 (29.3)    Urine (mL/kg/hr) 2470 (1.4)    Drains 125    Stool 0    Total Output 2595    Net -368.5         Stool Occurrence 1 x     CVP:  [5 mmHg] 5 mmHg   Foley:  intact  A: Acute renal failure, resolved     Hyponatremia: resolved     Hypokalemia  P:  Continue to keep negative fluid balance as tolerates but bp runs low as does K.     GASTROINTESTINAL  Lab 01/09/12 0510 01/07/12 0430 01/05/12 0520  AST 55* 432* 2252*  ALT 191* 575* 1087*  ALKPHOS 95 123* 81  BILITOT 0.3 0.3 0.4  PROT 5.4* 5.0* 4.6*  ALBUMIN 1.4* 1.3* 1.4*   Lipase     Component Value Date/Time   LIPASE 49 01/03/2012 0550   CT abd 9/20 1. Decrease in moderate abdominal and pelvic ascites with  intraperitoneal drainage catheters now seen in place. Mild  increase in small bilateral pleural effusions. No significant  change in diffuse body wall edema.  2. Persistent small pseudocyst in the area the pancreatic head,  which is decreased in size since earlier exam on 12/27/2011.  Continued follow-up by CT is recommended.  3. Ancillary findings again noted including moderate hiatal  hernia, hepatic steatosis, and sigmoid diverticulosis.  A:  Acute Pancreatitis - lipase levels  improved, liver function test normal       Protein  calorie malnutrition       Guiaic pos -NG dc'd due to gastric ulcer       Shock Liver -improving P: protonix gtt -change to q 12h     Will need post pyloric panda eventually for TFs -(unable to place by IR )     ct TNA       HEMATOLOGIC  Lab 01/09/12 0510 01/08/12 0415 01/07/12 2200 01/07/12 0430 01/06/12 0350  HGB 8.0* 11.5* 8.1* 6.8* 7.3*  HCT 24.2* 35.9* 25.2* 21.1* 23.0*  PLT 338 277 313 302 359  INR -- -- -- -- --  APTT -- -- -- -- --   A:  Mild Anemia - likely hemodilution from fluid administration P:    Holding sq lovenox due to hb drop/  gastric ulcer , use SCDs for dvt proph -high risk   INFECTIOUS  Lab 01/09/12 0510 01/08/12 0415 01/07/12 2200 01/07/12 0430 01/06/12 0350  WBC 13.7* 10.5 18.4* 17.9* 17.5*  PROCALCITON -- -- -- -- --   Cultures: Blood - 9/11>>>ng Urine - 9/8 - Multiple bacterial morphotypes c diff pcr 9/14 >> POS 9/15 ascites >>ng  Antibiotics: Zosyn 9/11>>> Vanc 9/12>>>9/15 Flagyl 9/15 >>  A: Septic shock -resolved     UTI - positive for leuks, and nitrites, cultures likely contaminated     C diff colitis     Peritonitis  P:   Continue empiric zosyn, low threshold to add fungal coverage but improved WC       Ct Flagyl        Abd drains per CCS  ENDOCRINE CBG (last 3)   Basename 01/10/12 0340 01/10/12 0001 01/09/12 2002  GLUCAP 154* 144* 131*   A: No issues currently   P: CBG Q4H r/t high risk for hyperglycemia  NEUROLOGIC A: Alcohol withdrawal/ acute encephalopathy -resolved, ammonia nml     Pain r/t acute pancreatitis     Chronic narcotic dependence  P:  int fentanyl  Not effective 9/22 add  maint ativan with prn versed given etoh hx           Sandrea Hughs, MD Pulmonary and Critical Care Medicine [facility] Cell [phone]

## 2012-01-10 NOTE — Progress Notes (Signed)
eLink Physician-Brief Progress Note Patient Name: [name] DOB: 1956-01-13 MRN: [id number]  Date of Service  01/10/2012   HPI/Events of Note   Pain, getting fentanyl q1h  eICU Interventions  Fentanyl gtt      [name] 01/10/2012, 10:46 PM

## 2012-01-10 NOTE — Progress Notes (Signed)
4 Days Post-Op  Subjective: Failed to wean vent yesterday.  Some anxiousness.    Objective: Vital signs in last 24 hours: Temp:  [99 F (37.2 C)-100.3 F (37.9 C)] 99 F (37.2 C) (09/22 0400) Pulse Rate:  [76-93] 83  (09/22 0600) Resp:  [17-26] 20  (09/22 0600) BP: (92-120)/(47-81) 98/54 mmHg (09/22 0600) SpO2:  [96 %-100 %] 99 % (09/22 0600) FiO2 (%):  [40 %] 40 % (09/22 0600) Last BM Date: 01/08/12  Intake/Output from previous day: 09/21 0701 - 09/22 0700 In: 2226.5 [I.V.:70; IV Piggyback:762.5; TPN:1370] Out: 2595 [Urine:2470; Drains:125] Intake/Output this shift:    General appearance: cooperative, no distress and responds appropriately to questions Resp: clear to auscultation bilaterally Cardio: normal rate, regular GI: soft, diffuse tenderness, ND, no change from priior exams.  JP's with milky white drainage, minimal output from left drain, right drain appears thinner than prior. Extremities: SCD's bilat  Lab Results:   Basename 01/09/12 0510 01/08/12 0415  WBC 13.7* 10.5  HGB 8.0* 11.5*  HCT 24.2* 35.9*  PLT 338 277   BMET  Basename 01/10/12 0500 01/09/12 0510  NA 135 135  K 3.4* 2.9*  CL 103 101  CO2 25 26  GLUCOSE 135* 160*  BUN 15 13  CREATININE 0.50 0.52  CALCIUM 7.5* 7.4*   PT/INR No results found for this basename: LABPROT:2,INR:2 in the last 72 hours ABG No results found for this basename: PHART:2,PCO2:2,PO2:2,HCO3:2 in the last 72 hours  Studies/Results: Ct Abdomen Pelvis W Contrast  01/08/2012  *RADIOLOGY REPORT*  Clinical Data: Follow-up pancreatitis and abdominal and pelvic fluid collections.  CT ABDOMEN AND PELVIS WITH CONTRAST  Technique:  Multidetector CT imaging of the abdomen and pelvis was performed following the standard protocol during bolus administration of intravenous contrast.  Contrast: OMNIPAQUE IOHEXOL 300 MG/ML  SOLN  Comparison: 09/14/2013and 12/27/2011  Findings: Mild increase in size of small bilateral pleural  effusions is seen.  Intraperitoneal drainage catheters are now seen in place, and decreased ascites is seen within the abdomen pelvis since prior exam.  Foley catheter again seen within the bladder which is collapsed.  A small pseudocyst is again seen in the pancreatic head.  This shows mild enlargement compared to recent study 6 days ago, currently measuring 2.3 x 2.4 cm, but is significantly decreased in size compared to earlier exam on 12/27/2011. No other loculated peri pancreatic fluid collections are identified. There is no evidence of distal pancreatic atrophy or pancreatic ductal dilatation.  Prior cholecystectomy noted.  No evidence of biliary ductal dilatation.  Hepatic steatosis is again demonstrated, without evidence of focal liver mass.  The spleen, adrenal glands, and kidneys are normal in appearance.  No evidence of lymphadenopathy.  Uterus and adnexa are unremarkable.  Sigmoid diverticulosis is noted, however there is no evidence of diverticulitis.  No evidence of bowel obstruction. Diffuse body wall edema again noted. Moderate hiatal hernia again seen.  IMPRESSION:  1.  Decrease in moderate abdominal and pelvic ascites with intraperitoneal drainage catheters now seen in place.  Mild increase in small bilateral pleural effusions. No significant change in diffuse body wall edema. 2.  Persistent small pseudocyst in the area the pancreatic head, which is decreased in size since earlier exam on 12/27/2011. Continued follow-up by CT is recommended. 3.  Ancillary findings again noted including moderate hiatal hernia, hepatic steatosis, and sigmoid diverticulosis.   Original Report Authenticated By: Danae Orleans, M.D.    Dg Chest Port 1 View  01/09/2012  *RADIOLOGY REPORT*  Clinical Data: Pneumonia.  Pleural effusions.  PORTABLE CHEST - 1 VIEW  Comparison: 01/08/2012  Findings: Endotracheal tube tip 3.7 cm above the carina.  Right IJ line tip projects over the SVC.  Low lung volumes are present, causing  crowding of the pulmonary vasculature.  The patient is rotated to the right on today's exam, resulting in reduced diagnostic sensitivity and specificity. Stable mild atelectasis noted along the right hemidiaphragm. Minimally improved aeration at the left lung base, although retrocardiac airspace opacity persists.  IMPRESSION:  1.  Stable appearance of bibasilar airspace opacity and low lung volumes.  Support apparatus unchanged.   Original Report Authenticated By: Dellia Cloud, M.D.     Anti-infectives: Anti-infectives     Start     Dose/Rate Route Frequency Ordered Stop   01/03/12 1600   metroNIDAZOLE (FLAGYL) IVPB 500 mg        500 mg 100 mL/hr over 60 Minutes Intravenous Every 8 hours 01/03/12 1525     12/31/11 1100   vancomycin (VANCOCIN) 750 mg in sodium chloride 0.9 % 150 mL IVPB  Status:  Discontinued        750 mg 150 mL/hr over 60 Minutes Intravenous Every 12 hours 12/31/11 0958 01/03/12 0849   12/30/11 1000   piperacillin-tazobactam (ZOSYN) IVPB 3.375 g        3.375 g 12.5 mL/hr over 240 Minutes Intravenous Every 8 hours 12/30/11 0901            Assessment/Plan: s/p Procedure(s) (LRB) with comments: ESOPHAGOGASTRODUODENOSCOPY (EGD) (N/A) - bedside continue vent wean.  Consider dobhoff feeds or even consider oral feeds when extubated to see if any change in abdominal exam or drain output.  TPN for now.  LOS: 15 days    [name] DAVID 01/10/2012

## 2012-01-10 NOTE — Progress Notes (Signed)
Received a call from Mariella Saa, MD @ [phone], per MD order modify fentanyl inj q1hr prn & stated"someone will be down to look over patient."

## 2012-01-10 NOTE — Progress Notes (Signed)
PARENTERAL NUTRITION CONSULT NOTE - FOLLOW UP  Pharmacy Consult for TNA Indication: Severe Pancreatitis  Allergies  Allergen Reactions  . Ciprofloxacin Nausea Only    REACTION: nausea   Patient Measurements: Height: 5\' 6"  (167.6 cm) Weight: 167 lb 12.3 oz (76.1 kg) IBW/kg (Calculated) : 59.3   Vital Signs: Temp: 99 F (37.2 C) (09/22 0400) Temp src: Oral (09/22 0400) BP: 98/54 mmHg (09/22 0600) Pulse Rate: 83  (09/22 0600) Intake/Output from previous day: 09/21 0701 - 09/22 0700 In: 2226.5 [I.V.:70; IV Piggyback:762.5; TPN:1370] Out: 2595 [Urine:2470; Drains:125] Intake/Output from this shift:    Labs:  Basename 01/09/12 0510 01/08/12 0415 01/07/12 2200  WBC 13.7* 10.5 18.4*  HGB 8.0* 11.5* 8.1*  HCT 24.2* 35.9* 25.2*  PLT 338 277 313  APTT -- -- --  INR -- -- --    Basename 01/10/12 0500 01/09/12 0510 01/08/12 1620 01/08/12 0415  NA 135 135 137 --  K 3.4* 2.9* 3.3* --  CL 103 101 102 --  CO2 25 26 27  --  GLUCOSE 135* 160* 185* --  BUN 15 13 12  --  CREATININE 0.50 0.52 0.56 --  LABCREA -- -- -- --  CREAT24HRUR -- -- -- --  CALCIUM 7.5* 7.4* 7.3* --  MG 1.8 -- -- --  PHOS 2.9 2.9 -- 2.2*  PROT -- 5.4* -- --  ALBUMIN -- 1.4* -- --  AST -- 55* -- --  ALT -- 191* -- --  ALKPHOS -- 95 -- --  BILITOT -- 0.3 -- --  BILIDIR -- -- -- --  IBILI -- -- -- --  PREALBUMIN -- -- -- --  TRIG -- -- -- --  CHOLHDL -- -- -- --  CHOL -- -- -- --   Estimated Creatinine Clearance: 81.8 ml/min (by C-G formula based on Cr of 0.5).    Basename 01/10/12 0340 01/10/12 0001 01/09/12 2002  GLUCAP 154* 144* 131*   Insulin Requirements in the past 24 hours:   CBGs 131-183, required 20 units of ICU glycemic control SSI per MD (Novolog 2-6 units Q4h)  Nutritional Goals:   RD recs 9/16: 1762 kcal, 86-95 grams protein  Clinimix 5/20 @ 66ml/hr + IVF 20% MWF will provide 90 g protein/day, 2064 kCal on lipid days, 1584 Kcal on non-lipid days for an average of 1790 Kcal per  day/week.  Current nutrition:   Diet: NPO  IVF: NS @ KVO  Clinimix E 5/20 @ 60 ml/hr  Assessment:   [name] admitted 9/8 with epigastric abd pain, fever, chills and N/V. Pt with h/o recurrent pancreatitis and ETOH abuse, s/p ERCP with sphincterotomy on 10/06/2011. Dx with acute pancreatitis this admit, developed ileus. Rapid response called 9/12 for respiratory decline and lethargy - pt transfer to ICU and CCM consulted. Intubated 9/12. 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 Clinical deterioration with rising WBC and findings suggesting GI perforation, underwent exploratory laparotomy with debridement of necrotic pancreas and drainage of infected, ruptured pseudocyst 9/15. TNA started 9/16.   OK from surgery standpoint for feeding tube and tube feeds but EGD 9/18 with ulcer in the cardia of the stomach - continue TNA  Renal/hepatic fxn: Scr wnl, AST/ALT was wnl but bumped to 2252/1087 on 9/17 AM. ?shock liver 2/2 pancreatitis. Now continues to improve and trend down.  Alk phos now wnl. Lytes: K+ improved this am (3.4) but still low. Lasix 40mg  IV q8h x 4 doses ended ~ 24 hours ago.  40 mEq KCL IV has been ordered for this am by MD already. Will not order any more K.  Phos and Mg wnl. Correct calcium 9.5 (wnl) Pre-Alb: 2.3 (9/17) TG/Cholesterol: wnl 9/17 CBGs: Continuing to trend down. Goal <150. Continue to monitor trend. No insulin in TNA currently. GI ppx:  Protonix 40mg  IV q12h  Plan:  At 1800 tonight  Continue Clinimix E 5/20 @ 60 ml/hr given persistent hypokalemia. - No further advancement until corrected.   TNA to contain IV fat emulsion, standard multivitamins and trace elements only on MWF only due to ongoing shortage   Continue IVF with NS @ KVO   Monitor CBGs - continue MD ordered SSI q4h at this time, change if needed  TNA labs Monday/Thursdays  Pharmacy will follow up  daily  Darrol Angel, PharmD Pager: [phone] 01/10/2012 7:28 AM

## 2012-01-10 NOTE — Progress Notes (Signed)
Pt complaining of 10/10 pain in the abdomen. R JP & L JP draining thick purulent drainage. Paged [facility] @ [phone].

## 2012-01-11 ENCOUNTER — Inpatient Hospital Stay (HOSPITAL_COMMUNITY): Payer: MEDICAID

## 2012-01-11 DIAGNOSIS — A0472 Enterocolitis due to Clostridium difficile, not specified as recurrent: Secondary | ICD-10-CM

## 2012-01-11 DIAGNOSIS — F1021 Alcohol dependence, in remission: Secondary | ICD-10-CM

## 2012-01-11 LAB — GLUCOSE, CAPILLARY
Glucose-Capillary: 110 mg/dL — ABNORMAL HIGH (ref 70–99)
Glucose-Capillary: 118 mg/dL — ABNORMAL HIGH (ref 70–99)
Glucose-Capillary: 140 mg/dL — ABNORMAL HIGH (ref 70–99)

## 2012-01-11 LAB — TYPE AND SCREEN
ABO/RH(D): A POS
Antibody Screen: NEGATIVE
Unit division: 0
Unit division: 0

## 2012-01-11 LAB — CBC
HCT: 23.2 % — ABNORMAL LOW (ref 36.0–46.0)
MCHC: 31.9 g/dL (ref 30.0–36.0)
MCV: 88.2 fL (ref 78.0–100.0)
RDW: 17.1 % — ABNORMAL HIGH (ref 11.5–15.5)

## 2012-01-11 LAB — COMPREHENSIVE METABOLIC PANEL
ALT: 83 U/L — ABNORMAL HIGH (ref 0–35)
AST: 28 U/L (ref 0–37)
Alkaline Phosphatase: 77 U/L (ref 39–117)
CO2: 24 mEq/L (ref 19–32)
Chloride: 102 mEq/L (ref 96–112)
GFR calc non Af Amer: >90 mL/min (ref >90)
Sodium: 133 mEq/L — ABNORMAL LOW (ref 135–145)
Total Bilirubin: 0.3 mg/dL (ref 0.3–1.2)

## 2012-01-11 LAB — DIFFERENTIAL
Basophils Absolute: 0.1 10*3/uL (ref 0.0–0.1)
Eosinophils Absolute: 0.3 10*3/uL (ref 0.0–0.7)
Lymphs Abs: 1.6 10*3/uL (ref 0.7–4.0)
Neutro Abs: 8.4 10*3/uL — ABNORMAL HIGH (ref 1.7–7.7)

## 2012-01-11 LAB — PREALBUMIN: Prealbumin: 12.2 mg/dL — ABNORMAL LOW (ref 17.0–34.0)

## 2012-01-11 LAB — CHOLESTEROL, TOTAL: Cholesterol: 81 mg/dL (ref 0–200)

## 2012-01-11 MED ORDER — FENTANYL CITRATE 0.05 MG/ML IJ SOLN
25.0000 ug | INTRAMUSCULAR | Status: DC | PRN
  Administered 2012-01-11: 50 ug via INTRAVENOUS
  Filled 2012-01-11: qty 2

## 2012-01-11 MED ORDER — MENTHOL 3 MG MT LOZG
1.0000 | LOZENGE | OROMUCOSAL | Status: DC | PRN
  Filled 2012-01-11: qty 9

## 2012-01-11 MED ORDER — LORAZEPAM 2 MG/ML IJ SOLN
1.0000 mg | Freq: Three times a day (TID) | INTRAMUSCULAR | Status: DC
  Administered 2012-01-11 – 2012-01-26 (×45): 1 mg via INTRAVENOUS
  Filled 2012-01-11 (×46): qty 1

## 2012-01-11 MED ORDER — ZINC TRACE METAL 1 MG/ML IV SOLN
INTRAVENOUS | Status: AC
  Administered 2012-01-11: 17:00:00 via INTRAVENOUS
  Filled 2012-01-11: qty 2000

## 2012-01-11 MED ORDER — ACETAMINOPHEN 10 MG/ML IV SOLN
1000.0000 mg | Freq: Four times a day (QID) | INTRAVENOUS | Status: AC
  Administered 2012-01-11 – 2012-01-12 (×4): 1000 mg via INTRAVENOUS
  Filled 2012-01-11 (×5): qty 100

## 2012-01-11 MED ORDER — FENTANYL CITRATE 0.05 MG/ML IJ SOLN
25.0000 ug | INTRAMUSCULAR | Status: DC | PRN
  Administered 2012-01-11 – 2012-01-12 (×11): 50 ug via INTRAVENOUS
  Filled 2012-01-11 (×10): qty 2

## 2012-01-11 MED ORDER — FAT EMULSION 20 % IV EMUL
250.0000 mL | INTRAVENOUS | Status: AC
  Administered 2012-01-11: 250 mL via INTRAVENOUS
  Filled 2012-01-11: qty 250

## 2012-01-11 MED ORDER — FUROSEMIDE 10 MG/ML IJ SOLN
40.0000 mg | Freq: Once | INTRAMUSCULAR | Status: AC
  Administered 2012-01-11: 40 mg via INTRAVENOUS
  Filled 2012-01-11: qty 4

## 2012-01-11 MED ORDER — POTASSIUM CHLORIDE 10 MEQ/100ML IV SOLN
10.0000 meq | INTRAVENOUS | Status: AC
  Administered 2012-01-11 (×3): 10 meq via INTRAVENOUS
  Filled 2012-01-11: qty 300

## 2012-01-11 NOTE — Progress Notes (Signed)
Patient extubated to a Gutierrez 2lpm without complications. Cuff deflated, oral suction, extubated. Hr 94, RR 20, o2 sat. 98%. Phonics strong and clear. Will continue to monitor.

## 2012-01-11 NOTE — Progress Notes (Signed)
Name: [name] MRN: [id number] DOB: Jan 07, 1956    LOS: 16  Referring Provider:  [name] Reason for Referral:  Acute Respiratory Failure  PULMONARY / CRITICAL CARE MEDICINE  Brief patient profile:  55 yowf adm by [facility] via [facility] ER on 09/07 with acute pancreatitis. H/O ERCP with sphincterotomy on 10/06/2011. On 9/12  RRT was called for respiratory decline and lethargy, subsequently required mechanical ventilation & Exploratory laparotomy 9/15 with debridement of necrotic pancreas and drainage.   Events Since Admission: 9/12 RRT and subsequent transfer to the ICU. 9/14 TFs held due to firm abdomen & sub glottic suctioning, WC higher 9/14 CT abd:  increased ascites, Persistent edema/inflammation surrounding the head of the pancreas, with an approximate 1.8 x 2.6 cm pseudocyst at the junction of the head and body. Developing pseudocyst in the pancreatic head measuring approximately 1.3 x 2.6 cm, Large hiatal hernia.  9/14 doppler UE neg for DVT 9/15 1.5 L paracentesis - green brown fluid 9/15 Exploratory laparotomy with debridement of necrotic pancreas and drainage of the upper abdomen 9/18 EGD (Ganem) - 10-12 mm ulcer in the cardia of the stomach, protonix gtt, removed panda 9/20 CT abd: Decrease in moderate abdominal and pelvic ascites with intraperitoneal drainage catheters now seen in place. Mild increase in small bilateral pleural effusions. No significant change in diffuse body wall edema. Persistent small pseudocyst in the area the pancreatic head, which is decreased in size since earlier exam 9/23 extubated  Current Status: Remains  Passed SBT. Extubated and looks good immediately post extubation. RASS 0   Vital Signs: Temp:  [98.2 F (36.8 C)-100.1 F (37.8 C)] 99.2 F (37.3 C) (09/23 0800) Pulse Rate:  [81-95] 87  (09/23 0800) Resp:  [15-31] 15  (09/23 0800) BP: (90-117)/(48-72) 102/54 mmHg (09/23 0800) SpO2:  [98 %-100 %] 100 % (09/23 0800) FiO2 (%):  [40 %] 40 % (09/23  0734) Weight:  [78.8 kg (173 lb 11.6 oz)] 78.8 kg (173 lb 11.6 oz) (09/23 0543)  Physical Examination: General: chronically ill  appearance. HEENT: WNL Cardiovascular:  RRR s M.  Pulmonary/Chest: clear anteriorly Abdominal: Mildly distended but soft. Mildly tender diffusely. Hypeoactive bowel sounds. Midline lap incision with 2 JP drains Musculoskeletal: 1-2+ edema and no tenderness.  Neurological: no focal deficits   Principal Problem:  *Acute pancreatitis Active Problems:  Alcohol abuse  Epigastric abdominal pain  Leukocytosis  Hyponatremia  Acute respiratory failure  Shock  ARDS (adult respiratory distress syndrome)  C. difficile colitis   ASSESSMENT AND PLAN  PULMONARY  ETT 9/12>>>9/23  CXR: biasilar atx  A: 1)  VDRF post lap -  2) ARDS resolved 2)  Probable PNA HCAP vs Aspiration.       P:   Monitor closely post- extubation   CARDIOVASCULAR ECG:   No new EKG Lines:  RIJ 9/13 >>  A: Septic shock, resolved P: Monitor    RENAL  Lab 01/11/12 0540 01/10/12 0500 01/09/12 0510 01/08/12 1620 01/08/12 0415 01/07/12 0430 01/05/12 0520  NA 133* 135 135 137 142 -- --  K 3.8 3.4* -- -- -- -- --  CL 102 103 101 102 110 -- --  CO2 24 25 26 27 24  -- --  BUN 16 15 13 12 13  -- --  CREATININE 0.51 0.50 0.52 0.56 0.52 -- --  CALCIUM 7.6* 7.5* 7.4* 7.3* 7.2* -- --  MG 1.8 1.8 -- -- -- 2.4 2.5  PHOS 3.1 2.9 2.9 -- 2.2* 1.7* --    A: Acute renal failure,  resolved     Hyponatremia: resolved     Hypokalemia, resolved  P:  Continue to keep negative fluid balance as tolerates but bp runs low as does K.       9/23 one dose of lasix + 3 runs of kcl  GASTROINTESTINAL  Lab 01/11/12 0540 01/09/12 0510 01/07/12 0430 01/05/12 0520  AST 28 55* 432* 2252*  ALT 83* 191* 575* 1087*  ALKPHOS 77 95 123* 81  BILITOT 0.3 0.3 0.3 0.4  PROT 5.6* 5.4* 5.0* 4.6*  ALBUMIN 1.5* 1.4* 1.3* 1.4*   Lipase     Component Value Date/Time   LIPASE 113* 01/11/2012 0540    A:  Acute  Pancreatitis - lipase levels  improved, liver function test normal       Protein calorie malnutrition       Guiaic pos -NG dc'd due to gastric ulcer       Shock Liver -improving P: protonix gtt -changed to q 12h     cont TNA - begin POs when OK per CCS       HEMATOLOGIC  Lab 01/11/12 0540 01/10/12 0830 01/09/12 0510 01/08/12 0415 01/07/12 2200  HGB 7.4* 7.8* 8.0* 11.5* 8.1*  HCT 23.2* 24.2* 24.2* 35.9* 25.2*  PLT 441* 478* 338 277 313  INR -- -- -- -- --  APTT -- -- -- -- --   A:  Mild Anemia - ICU acquired anemia P:    Holding sq lovenox due to hb drop/ gastric ulcer , use SCDs for dvt proph -high risk Transfuse for Hgb<7.0 if not actively bleeding   INFECTIOUS  Lab 01/11/12 0540 01/10/12 0830 01/09/12 0510 01/08/12 0415 01/07/12 2200  WBC 11.6* 10.4 13.7* 10.5 18.4*  PROCALCITON -- -- -- -- --   Cultures: Blood - 9/11>>>ng Urine - 9/8 - Multiple bacterial morphotypes c diff pcr 9/14 >> POS 9/15 ascites >>ng  Antibiotics: Zosyn 9/11>>> Vanc 9/12>>>9/15 Flagyl 9/15 >>  A: Septic shock -resolved     UTI - positive for leuks, and nitrites, cultures likely contaminated     C diff colitis     Peritonitis  P:   Continue empiric zosyn, low threshold to add fungal coverage but improved WC       Ct IV Flagyl for C diff - consider enteral flagyl or vanc when taking POs        Abd drains per CCS   ENDOCRINE CBG (last 3)   Basename 01/11/12 0748 01/11/12 0338 01/10/12 2331  GLUCAP 125* 132* 127*   A: No issues currently   P: CBG Q4H r/t high risk for hyperglycemia  NEUROLOGIC A: Alcohol withdrawal/ acute encephalopathy -resolved, ammonia nml     Pain r/t acute pancreatitis     Chronic narcotic dependence  P:  int fentanyl             Brett Canales Minor ACNP Adolph Pollack PCCM Pager [phone] till 3 pm If no answer page [phone] 01/11/2012, 9:36 AM    Seen on CCM rounds this morning with resident MD or ACNP above.  Pt examined and database reviewed. I agree with  above findings, assessment and plan as reflected in the note above. 40 mins CCM time  Billy Fischer, MD;  PCCM service; Mobile [phone]

## 2012-01-11 NOTE — Evaluation (Signed)
Physical Therapy Evaluation Patient Details Name: [name] MRN: [id number] DOB: 09-18-1955 Today's Date: 01/11/2012 Time: 6578-4696 PT Time Calculation (min): 30 min  PT Assessment / Plan / Recommendation Clinical Impression  Pt. was admitted for N/V.  respiratory distress, VDRF, s/p exploratory lap for necrotic pancreas. Pt. will benfit from PT to improve in functional mobility to DC to next venue as pt. lives alone.    PT Assessment  Patient needs continued PT services    Follow Up Recommendations  Skilled nursing facility    Barriers to Discharge Decreased caregiver support      Equipment Recommendations  Rolling walker with 5" wheels    Recommendations for Other Services OT consult   Frequency Min 3X/week    Precautions / Restrictions Precautions Precaution Comments: abdominal VAC and drains   Pertinent Vitals/Pain ABD. Pain. Was premedicated. HR 100 sats on RA > 93%      Mobility  Bed Mobility Bed Mobility: Rolling Right;Rolling Left;Supine to Sit;Sit to Supine Rolling Right: 1: +2 Total assist Rolling Right: Patient Percentage: 30% Rolling Left: 1: +2 Total assist Rolling Left: Patient Percentage: 30% Supine to Sit: 1: +2 Total assist Supine to Sit: Patient Percentage: 20% Sit to Supine: 1: +2 Total assist Sit to Supine: Patient Percentage: 10% Details for Bed Mobility Assistance: frequent VC for activity to roll. Transfers Transfers: Not assessed Details for Transfer Assistance: Pt is too weak to stand, barely kept balance    Exercises     PT Diagnosis: Generalized weakness;Difficulty walking  PT Problem List: Decreased strength;Decreased activity tolerance;Decreased range of motion;Decreased balance;Decreased safety awareness;Decreased knowledge of use of DME;Decreased cognition;Pain PT Treatment Interventions: DME instruction;Gait training;Functional mobility training;Therapeutic activities;Patient/family education;Therapeutic exercise;Balance  training   PT Goals Acute Rehab PT Goals PT Goal Formulation: With patient Time For Goal Achievement: 01/25/12 Potential to Achieve Goals: Good Pt will go Supine/Side to Sit: with min assist PT Goal: Supine/Side to Sit - Progress: Goal set today Pt will go Sit to Supine/Side: with min assist PT Goal: Sit to Supine/Side - Progress: Goal set today Pt will go Stand to Sit: with min assist PT Goal: Stand to Sit - Progress: Goal set today Pt will Transfer Bed to Chair/Chair to Bed: with min assist PT Transfer Goal: Bed to Chair/Chair to Bed - Progress: Goal set today Pt will Ambulate: 16 - 50 feet;with min assist;with rolling walker PT Goal: Ambulate - Progress: Goal set today  Visit Information  Last PT Received On: 01/11/12 Assistance Needed: +2    Subjective Data  Subjective: I am weak Patient Stated Goal: to go home   Prior Functioning  Home Living Lives With: Alone Type of Home: House Home Access: Stairs to enter Secretary/administrator of Steps: 3 Home Layout: One level Bathroom Shower/Tub: Engineer, manufacturing systems: Standard Home Adaptive Equipment: None Additional Comments: pt has son  Prior Function Level of Independence: Independent Communication Communication: No difficulties    Cognition  Overall Cognitive Status: Impaired Area of Impairment: Memory;Problem solving Arousal/Alertness: Awake/alert Orientation Level: Place;Situation;Time Behavior During Session: Anxious    Extremity/Trunk Assessment Right Lower Extremity Assessment RLE ROM/Strength/Tone: Deficits RLE ROM/Strength/Tone Deficits: strength at best 3/5 throughout. RLE Sensation: WFL - Light Touch Left Lower Extremity Assessment LLE ROM/Strength/Tone: Deficits LLE ROM/Strength/Tone Deficits: same as R   Balance Static Sitting Balance Static Sitting - Balance Support: Bilateral upper extremity supported Static Sitting - Level of Assistance: 3: Mod assist Static Sitting - Comment/# of  Minutes: x8 min  End of Session PT -  End of Session Activity Tolerance: Patient limited by fatigue Patient left: in bed;with call bell/phone within reach Nurse Communication: Mobility status  GP     Rada Hay 01/11/2012, 3:47 PM  [phone]

## 2012-01-11 NOTE — Progress Notes (Signed)
Patient ID: [name], female   DOB: [birth year], 56 y.o.   MRN: [id number] 5 Days Post-Op  Subjective: Weaning well.  Complains of pain.    Objective: Vital signs in last 24 hours: Temp:  [96.9 F (36.1 C)-100.1 F (37.8 C)] 99.2 F (37.3 C) (09/23 0800) Pulse Rate:  [81-95] 87  (09/23 0800) Resp:  [15-31] 15  (09/23 0800) BP: (90-117)/(48-72) 102/54 mmHg (09/23 0800) SpO2:  [98 %-100 %] 100 % (09/23 0800) FiO2 (%):  [40 %] 40 % (09/23 0734) Weight:  [173 lb 11.6 oz (78.8 kg)] 173 lb 11.6 oz (78.8 kg) (09/23 0543) Last BM Date: 01/11/12  Intake/Output from previous day: 09/22 0701 - 09/23 0700 In: 2220 [I.V.:200; IV Piggyback:450; TPN:1550] Out: 2438 [Urine:2325; Drains:113] Intake/Output this shift: Total I/O In: 250 [I.V.:90; IV Piggyback:100; TPN:60] Out: 250 [Urine:125; Drains:125]  General appearance: cooperative, no distress and responds appropriately to questions Resp: no distress Cardio: normal rate, regular GI: soft, diffuse tenderness, ND, no change from priior exams.  JP's with milky white drainage, minimal output from left drain, right drain purulent Extremities: SCD's bilat  Lab Results:   Basename 01/11/12 0540 01/10/12 0830  WBC 11.6* 10.4  HGB 7.4* 7.8*  HCT 23.2* 24.2*  PLT 441* 478*   BMET  Basename 01/11/12 0540 01/10/12 0500  NA 133* 135  K 3.8 3.4*  CL 102 103  CO2 24 25  GLUCOSE 122* 135*  BUN 16 15  CREATININE 0.51 0.50  CALCIUM 7.6* 7.5*   PT/INR No results found for this basename: LABPROT:2,INR:2 in the last 72 hours ABG No results found for this basename: PHART:2,PCO2:2,PO2:2,HCO3:2 in the last 72 hours  Studies/Results: Dg Chest Port 1 View  01/11/2012  *RADIOLOGY REPORT*  Clinical Data: Endotracheal tube placement.  PORTABLE CHEST - 1 VIEW  Comparison: 01/09/2012.  Findings: Endotracheal tube tip is 30 mm from the carina.  Lung volumes are lower than on prior exam.  Retrocardiac density is present, retrocardiac density  is present likely representing atelectasis. There may be a small left pleural effusion.  Discoid atelectasis over the right hemidiaphragm.  Right IJ central line is unchanged.  IMPRESSION:  1.  Stable support apparatus. 2.  Increasing bibasilar atelectasis.  Probable small left pleural effusion.   Original Report Authenticated By: Andreas Newport, M.D.     Anti-infectives: Anti-infectives     Start     Dose/Rate Route Frequency Ordered Stop   01/03/12 1600   metroNIDAZOLE (FLAGYL) IVPB 500 mg        500 mg 100 mL/hr over 60 Minutes Intravenous Every 8 hours 01/03/12 1525     12/31/11 1100   vancomycin (VANCOCIN) 750 mg in sodium chloride 0.9 % 150 mL IVPB  Status:  Discontinued        750 mg 150 mL/hr over 60 Minutes Intravenous Every 12 hours 12/31/11 0958 01/03/12 0849   12/30/11 1000   piperacillin-tazobactam (ZOSYN) IVPB 3.375 g        3.375 g 12.5 mL/hr over 240 Minutes Intravenous Every 8 hours 12/30/11 0901            Assessment/Plan: s/p Procedure(s) (LRB) with comments: ESOPHAGOGASTRODUODENOSCOPY (EGD) (N/A) - bedside s/p ex lap and pancreatic drainage.  Check lipase today.   Vent wean per CCM Continue drains/ antibiotics If extubates, OK to have sips or ice chips from my standpoint if OK with CCM.  Continue current care.    LOS: 16 days    [name] 01/11/2012

## 2012-01-11 NOTE — Procedures (Signed)
Extubation Procedure Note  Patient Details:   Name: [name] DOB: 1956-02-04 MRN: [id number]   Airway Documentation:  Airway 7.5 mm (Active)  Secured at (cm) 22 cm 01/11/2012  7:34 AM  Measured From Lips 01/11/2012  7:34 AM  Secured Location Center 01/11/2012  7:34 AM  Secured By [employer] 01/11/2012  7:34 AM  Tube Holder Repositioned Yes 01/11/2012  7:34 AM  Cuff Pressure (cm H2O) 24 cm H2O 01/09/2012  7:42 PM    Evaluation  O2 sats: stable throughout Complications: No apparent complications Patient did tolerate procedure well. Bilateral Breath Sounds: RhonchiSuctioning: Oral;Airway Yes  Marvel Plan 01/11/2012, 11:50 AM

## 2012-01-11 NOTE — Progress Notes (Signed)
[id number]/Jahdai Padovano, RN, BSN, CCM: CHART REVIEWED AND UPDATED. NO DISCHARGE NEEDS PRESENT AT THIS TIME. CASE MANAGEMENT [phone] 

## 2012-01-11 NOTE — Plan of Care (Signed)
Problem: Inadequate Intake (NI-2.1) Goal: Food and/or nutrient delivery Individualized approach for food/nutrient provision.  Outcome: Progressing TNA increasing tonight to 75 ml/hr.

## 2012-01-11 NOTE — Progress Notes (Signed)
PARENTERAL NUTRITION CONSULT NOTE - FOLLOW UP  Pharmacy Consult for TNA Indication: Severe Pancreatitis  Allergies  Allergen Reactions  . Ciprofloxacin Nausea Only    REACTION: nausea   Patient Measurements: Height: 5\' 6"  (167.6 cm) Weight: 173 lb 11.6 oz (78.8 kg) (the first was wrong patient) IBW/kg (Calculated) : 59.3   Vital Signs: Temp: 98.2 [name] (36.8 C) (09/23 0400) Temp src: Axillary (09/23 0400) BP: 96/53 mmHg (09/23 0600) Pulse Rate: 89  (09/23 0600) Intake/Output from previous day: 09/22 0701 - 09/23 0700 In: 2150 [I.V.:190; IV Piggyback:450; TPN:1490] Out: 2438 [Urine:2325; Drains:113] Intake/Output from this shift:    Labs:  [facility] 01/11/12 0540 01/10/12 0830 01/09/12 0510  WBC 11.6* 10.4 13.7*  HGB 7.4* 7.8* 8.0*  HCT 23.2* 24.2* 24.2*  PLT 441* 478* 338  APTT -- -- --  INR -- -- --    Basename 01/11/12 0540 01/10/12 0500 01/09/12 0510  NA 133* 135 135  K 3.8 3.4* 2.9*  CL 102 103 101  CO2 24 25 26   GLUCOSE 122* 135* 160*  BUN 16 15 13   CREATININE 0.51 0.50 0.52  LABCREA -- -- --  CREAT24HRUR -- -- --  CALCIUM 7.6* 7.5* 7.4*  MG 1.8 1.8 --  PHOS 3.1 2.9 2.9  PROT 5.6* -- 5.4*  ALBUMIN 1.5* -- 1.4*  AST 28 -- 55*  ALT 83* -- 191*  ALKPHOS 77 -- 95  BILITOT 0.3 -- 0.3  BILIDIR -- -- --  IBILI -- -- --  PREALBUMIN -- -- --  TRIG -- -- --  CHOLHDL -- -- --  CHOL -- -- --   Estimated Creatinine Clearance: 83.2 ml/min (by C-G formula based on Cr of 0.51).    Basename 01/11/12 0338 01/10/12 2331 01/10/12 1912  GLUCAP 132* 127* 136*   Insulin Requirements in the past 24 hours:   CBGs 127-149, required 14 units of ICU HG protocol  Nutritional Goals:   RD recs 9/16: 1762 kcal, 86-95 grams protein  Clinimix 5/20 @ 69ml/hr + IVF 20% MWF will provide 90 g protein/day, 2064 kCal on lipid days, 1584 Kcal on non-lipid days for an average of 1790 Kcal per day/week.  Current nutrition:   Diet: NPO  IVF: NS @ KVO  Clinimix E 5/20 @ 60  ml/hr  Assessment:   56 yo [name] admitted 9/8 with epigastric abd pain, fever, chills and N/V. Pt with h/o recurrent pancreatitis and ETOH abuse, s/p ERCP with sphincterotomy on 10/06/2011. Dx with acute pancreatitis this admit, developed ileus.   Rapid response called 9/12 for respiratory decline and lethargy - pt transfer to ICU and CCM consulted. Intubated 9/12. 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   Clinical deterioration with rising WBC and findings suggesting GI perforation, underwent exploratory laparotomy with debridement of necrotic pancreas and drainage of infected, ruptured pseudocyst 9/15. TNA started 9/16.   Possible conversion to enteral feeds once extubated  Renal: Scr wnl Hepatic fxn: AST/ALT previously elevated ?shock liver 2/2 pancreatitis. Now continues to improve  Lytes: K+ improving will aggressive replacement Pre-Alb: 2.3 (9/17), pending today TG/Cholesterol: wnl 9/17, pending today CBGs: At goal <150  Plan:  At 1800 tonight  Advance Clinimix E 5/20 to 75 ml/hr unless TF initiated  TNA to contain IV fat emulsion, standard multivitamins and trace elements only on MWF only due to ongoing shortage   TNA labs Monday/Thursdays   Loralee Pacas, PharmD, BCPS Pager: [phone] 01/11/2012 7:08 AM

## 2012-01-11 NOTE — Progress Notes (Signed)
Nutrition Follow-up  Intervention:  1. TNA per pharmacy, recommend meet > 90% of estimated energy needs.  2. Recommend diet advancements as medically able.  3. RD to follow for nutrition plan of care.   Assessment:   Patient extubated today. Discussed patient in rounds. Per RN patient allowed ice chips this afternoon. Patient with wound VAC. Noted per MD, panda tube removed 9/18 due to 10-12 mm ulcer in the cardia of the stomach. Patient continues to receive TNA of Clinimix E 5/20 @ 60 ml/hr plus lipids 20% 10 ml/hr MWF, provides a weekly average of 1472 kcal and 72 grams of protein. (Meets 100% of estimated energy needs and 76.5% of estimated protein needs. Per pharmacy, plans to increase TNA to 75 ml/hr tonight. At goal rate of 75 ml/hr plus lipids 20% 10 ml/ hr MWF, TNA to provide a weekly average of 1790 kcal and 90 grams of protein. (Will meets 100% of estimated energy and 96% of estimated protein needs.)  Diet Order:  NPO, TNA Clinimix E 5/20 @ 60 ml/hr plus lipids 20% 10 ml/hr MWF.   Meds: Scheduled Meds:   . antiseptic oral rinse  15 mL Mouth Rinse QID  . chlorhexidine  15 mL Mouth Rinse BID  . folic acid  1 mg Intravenous Daily  . furosemide  40 mg Intravenous Once  . insulin aspart  2-6 Units Subcutaneous Q4H  . LORazepam  1 mg Intravenous Q6H  . metronidazole  500 mg Intravenous Q8H  . pantoprazole (PROTONIX) IV  40 mg Intravenous Q12H  . piperacillin-tazobactam (ZOSYN)  IV  3.375 g Intravenous Q8H  . potassium chloride  10 mEq Intravenous Q1 Hr x 3   Continuous Infusions:   . sodium chloride 10 mL/hr (01/08/12 2138)  . fat emulsion    . TPN (CLINIMIX) +/- additives 60 mL/hr at 01/09/12 1724  . TPN (CLINIMIX) +/- additives 60 mL/hr at 01/10/12 1720  . TPN (CLINIMIX) +/- additives    . DISCONTD: fentaNYL infusion INTRAVENOUS 100 mcg/hr (01/11/12 0100)   PRN Meds:.acetaminophen, fentaNYL, menthol-cetylpyridinium, ondansetron (ZOFRAN) IV, polyvinyl alcohol, DISCONTD:  fentaNYL, DISCONTD: fentaNYL, DISCONTD: midazolam  Labs:  CMP     Component Value Date/Time   NA 133* 01/11/2012 0540   K 3.8 01/11/2012 0540   CL 102 01/11/2012 0540   CO2 24 01/11/2012 0540   GLUCOSE 122* 01/11/2012 0540   BUN 16 01/11/2012 0540   CREATININE 0.51 01/11/2012 0540   CALCIUM 7.6* 01/11/2012 0540   PROT 5.6* 01/11/2012 0540   ALBUMIN 1.5* 01/11/2012 0540   AST 28 01/11/2012 0540   ALT 83* 01/11/2012 0540   ALKPHOS 77 01/11/2012 0540   BILITOT 0.3 01/11/2012 0540   GFRNONAA >90 01/11/2012 0540   GFRAA >90 01/11/2012 0540     Intake/Output Summary (Last 24 hours) at 01/11/12 1059 Last data filed at 01/11/12 1096  Gross per 24 hour  Intake 2097.5 ml  Output   2338 ml  Net -240.5 ml    Weight Status:  173 lb. Weight stable.   Re-estimated needs: 1475-1775 kcal and 94-110 grams protein  Nutrition Dx:  Inadequate Oral Intake, Ongoing.   Goal:  1. Diet advancements as medically able. 2. TNA per pharmacy, Meet > 90% of estimated energy needs. -Not yet meeting, continue.  3. Promote weight maintenance.   Monitor:  TNA per pharmacy, Diet advancements/ tolerance, weights, labs, I/O's   [name] [phone]

## 2012-01-12 ENCOUNTER — Inpatient Hospital Stay (HOSPITAL_COMMUNITY): Payer: MEDICAID

## 2012-01-12 DIAGNOSIS — Z9889 Other specified postprocedural states: Secondary | ICD-10-CM

## 2012-01-12 LAB — GLUCOSE, CAPILLARY
Glucose-Capillary: 139 mg/dL — ABNORMAL HIGH (ref 70–99)
Glucose-Capillary: 118 mg/dL — ABNORMAL HIGH (ref 70–99)

## 2012-01-12 LAB — BASIC METABOLIC PANEL
BUN: 13 mg/dL (ref 6–23)
Chloride: 102 mEq/L (ref 96–112)
Creatinine, Ser: 0.54 mg/dL (ref 0.50–1.10)
GFR calc Af Amer: >90 mL/min (ref >90)
GFR calc non Af Amer: >90 mL/min (ref >90)
Potassium: 3.5 mEq/L (ref 3.5–5.1)

## 2012-01-12 LAB — CBC
MCHC: 32.6 g/dL (ref 30.0–36.0)
RDW: 17 % — ABNORMAL HIGH (ref 11.5–15.5)
WBC: 12.8 10*3/uL — ABNORMAL HIGH (ref 4.0–10.5)

## 2012-01-12 MED ORDER — CLINIMIX E/DEXTROSE (5/20) 5 % IV SOLN
INTRAVENOUS | Status: AC
  Administered 2012-01-12: 17:00:00 via INTRAVENOUS
  Filled 2012-01-12: qty 2000

## 2012-01-12 MED ORDER — HYDROMORPHONE HCL PF 1 MG/ML IJ SOLN
1.0000 mg | INTRAMUSCULAR | Status: DC | PRN
  Administered 2012-01-12 – 2012-01-23 (×101): 1 mg via INTRAVENOUS
  Filled 2012-01-12 (×105): qty 1

## 2012-01-12 MED ORDER — POTASSIUM CHLORIDE 10 MEQ/100ML IV SOLN
10.0000 meq | INTRAVENOUS | Status: AC
  Administered 2012-01-12 (×4): 10 meq via INTRAVENOUS
  Filled 2012-01-12: qty 100
  Filled 2012-01-12: qty 300

## 2012-01-12 MED ORDER — FUROSEMIDE 10 MG/ML IJ SOLN
40.0000 mg | Freq: Once | INTRAMUSCULAR | Status: AC
  Administered 2012-01-12: 40 mg via INTRAVENOUS
  Filled 2012-01-12 (×2): qty 4

## 2012-01-12 NOTE — Progress Notes (Signed)
56 yo female admitted on 12/26/2011 with complex medical course including sepsis protocol, ARDS, extubated on 01/11/2012. Receiving TNA and lipids, runs of potassium for K+ 3.5. Assisted up to chair using sky-lift due to significant generalized weakness, sat up for 2 hours. Requesting pain medication hourly, fentanyl 50 mcg given x 2, dlaludid 1mg  iv given q2h. Routine ativan iv for anxiety, moral support given.

## 2012-01-12 NOTE — Progress Notes (Addendum)
PARENTERAL NUTRITION CONSULT NOTE - FOLLOW UP  Pharmacy Consult for TNA Indication: Severe Pancreatitis  Allergies  Allergen Reactions  . Ciprofloxacin Nausea Only    REACTION: nausea   Patient Measurements: Height: 5\' 6"  (167.6 cm) Weight: 166 lb 10.7 oz (75.6 kg) IBW/kg (Calculated) : 59.3   Vital Signs: Temp: 100.4 F (38 C) (09/24 0400) Temp src: Oral (09/24 0400) BP: 91/43 mmHg (09/24 0600) Pulse Rate: 97  (09/24 0600) Intake/Output from previous day: 09/23 0701 - 09/24 0700 In: 2926.8 [I.V.:230; IV Piggyback:1004; ZOX:0960.4] Out: 2985 [Urine:2835; Drains:150] Intake/Output from this shift:    Labs:  [facility] 01/12/12 0530 01/11/12 0540 01/10/12 0830  WBC 12.8* 11.6* 10.4  HGB 7.5* 7.4* 7.8*  HCT 23.0* 23.2* 24.2*  PLT 465* 441* 478*  APTT -- -- --  INR -- -- --    Basename 01/12/12 0530 01/11/12 0545 01/11/12 0540 01/10/12 0500  NA 132* -- 133* 135  K 3.5 -- 3.8 3.4*  CL 102 -- 102 103  CO2 22 -- 24 25  GLUCOSE 138* -- 122* 135*  BUN 13 -- 16 15  CREATININE 0.54 -- 0.51 0.50  LABCREA -- -- -- --  CREAT24HRUR -- -- -- --  CALCIUM 7.5* -- 7.6* 7.5*  MG -- -- 1.8 1.8  PHOS -- -- 3.1 2.9  PROT -- -- 5.6* --  ALBUMIN -- -- 1.5* --  AST -- -- 28 --  ALT -- -- 83* --  ALKPHOS -- -- 77 --  BILITOT -- -- 0.3 --  BILIDIR -- -- -- --  IBILI -- -- -- --  PREALBUMIN -- -- 12.2* --  TRIG -- 128 -- --  CHOLHDL -- -- -- --  CHOL -- 81 -- --   Estimated Creatinine Clearance: 81.6 ml/min (by C-G formula based on Cr of 0.54).    Basename 01/11/12 2351 01/11/12 2015 01/11/12 1555  GLUCAP 128* 140* 118*   Insulin Requirements in the past 24 hours:   CBGs 110-140, required 8 units of ICU HG protocol  Nutritional Goals:   RD recs 9/16: 1762 kcal, 86-95 grams protein  Reestimated s/p extubation 9/23: 1475-1775 kcal, 94-110 grams protein  Clinimix 5/20 @ 56ml/hr + IVF 20% MWF will provide 90 g protein/day, 2064 kCal on lipid days, 1584 Kcal on non-lipid  days for an average of 1790 Kcal per day/week.  Current nutrition:   Diet: NPO except ice chips  IVF: NS @ KVO  Clinimix E 5/20 @ 60 ml/hr  Assessment:   [name] admitted 9/8 with epigastric abd pain, fever, chills and N/V. Pt with h/o recurrent pancreatitis and ETOH abuse, s/p ERCP with sphincterotomy on 10/06/2011. Dx with acute pancreatitis this admit, developed ileus.   Rapid response called 9/12 for respiratory decline and lethargy - pt transfer to ICU and CCM consulted. Intubated 9/12. 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   Clinical deterioration with rising WBC and findings suggesting GI perforation, underwent exploratory laparotomy with debridement of necrotic pancreas and drainage of infected, ruptured pseudocyst 9/15. TNA started 9/16.   Possible conversion to enteral feeds now that extubated  Renal: Scr wnl Hepatic fxn: AST/ALT previously elevated ?shock liver 2/2 pancreatitis. Now continues to improve  Lytes: K+ remains will aggressive replacement. Na+ low but unable to adjust amount in TNA Pre-Alb: improving, 2.3 (9/17), 12.2 (9/23) TG/Cholesterol: wnl 9/17, 9/23 CBGs: At goal <150  Plan:   In order to meet new estimated needs,  would need to be changed to E5/15 formulation and rate increased to 69ml/hr. However, anticipate that diet can be advanced so will continue at current rate.   Continue Clinimix E 5/20 75 ml/hr unless TF initiated or PO advanced  KCL IV x 4 runs today  TNA to contain IV fat emulsion, standard multivitamins and trace elements only on MWF only due to ongoing shortage   TNA labs Monday/Thursdays   Loralee Pacas, PharmD, BCPS Pager: [phone] 01/12/2012 7:10 AM

## 2012-01-12 NOTE — Evaluation (Signed)
Occupational Therapy Evaluation Patient Details Name: [name] MRN: [id number] DOB: [birth year] Today's Date: 01/12/2012 Time: 4098-1191 OT Time Calculation (min): 24 min  OT Assessment / Plan / Recommendation Clinical Impression  Pt. was admitted for N/V.  respiratory distress, VDRF, s/p exploratory lap for necrotic pancreas. Pt limited by pain and severe weakness. Skilled OT recommended to maximize independence with BADLs to setup-mod A level in prep for d/c to next venue of care.    OT Assessment  Patient needs continued OT Services    Follow Up Recommendations  Skilled nursing facility    Barriers to Discharge Inaccessible home environment;Decreased caregiver support    Equipment Recommendations  3 in 1 bedside comode;Rolling walker with 5" wheels    Recommendations for Other Services    Frequency  Min 2X/week    Precautions / Restrictions Precautions Precautions: Fall Precaution Comments: 2 abd. drains, very weak   Pertinent Vitals/Pain HR elevated to 110 with activity. O2 stable on RA.    ADL  Grooming: Performed;Wash/dry face;Minimal assistance Where Assessed - Grooming: Unsupported sitting Upper Body Bathing: Simulated;Maximal assistance Where Assessed - Upper Body Bathing: Unsupported sitting Lower Body Bathing: Simulated;+2 Total assistance Lower Body Bathing: Patient Percentage: 10% Where Assessed - Lower Body Bathing: Supported sit to stand Upper Body Dressing: Simulated;Maximal assistance Where Assessed - Upper Body Dressing: Unsupported sitting Lower Body Dressing: Simulated;+2 Total assistance Lower Body Dressing: Patient Percentage: 10% Where Assessed - Lower Body Dressing: Supported sit to stand Toilet Transfer: Simulated;+2 Total assistance Toilet Transfer: Patient Percentage: 40% Statistician Method: Surveyor, minerals: Other (comment) (to recliner) Toileting - Clothing Manipulation and Hygiene: Simulated;+2 Total  assistance Toileting - Architect and Hygiene: Patient Percentage: 0% Where Assessed - Toileting Clothing Manipulation and Hygiene: Standing ADL Comments: Attempted to stand with RW but pt stated it was too painful. Pivoted to the chair with +2 A. Pt extremely weak and deconditioned.    OT Diagnosis: Generalized weakness  OT Problem List: Decreased strength;Decreased safety awareness;Decreased activity tolerance;Impaired balance (sitting and/or standing);Decreased knowledge of use of DME or AE;Impaired UE functional use;Pain OT Treatment Interventions: Self-care/ADL training;Therapeutic activities;DME and/or AE instruction;Patient/family education;Balance training   OT Goals Acute Rehab OT Goals OT Goal Formulation: With patient Time For Goal Achievement: 01/26/12 Potential to Achieve Goals: Good ADL Goals Pt Will Perform Grooming: with set-up;Unsupported;Sitting, chair;Sitting, edge of bed (x 3 tasks to improve dynamic sitting balance and act tol.) ADL Goal: Grooming - Progress: Goal set today Pt Will Transfer to Toilet: with mod assist;Squat pivot transfer;Stand pivot transfer;3-in-1 ADL Goal: Toilet Transfer - Progress: Goal set today Pt Will Perform Toileting - Clothing Manipulation: with mod assist;Sitting on 3-in-1 or toilet;Standing ADL Goal: Toileting - Clothing Manipulation - Progress: Goal set today Pt Will Perform Toileting - Hygiene: with mod assist;Sit to stand from 3-in-1/toilet ADL Goal: Toileting - Hygiene - Progress: Goal set today Additional ADL Goal #1: Pt will complete supine to sit with min A. ADL Goal: Additional Goal #1 - Progress: Goal set today Arm Goals Pt Will Complete Theraband Exer: with supervision, verbal cues required/provided;Bilateral upper extremities;to increase strength;1 set;10 reps;Level 1 Theraband Arm Goal: Theraband Exercises - Progress: Goal set today  Visit Information  Last OT Received On: 01/12/12 Assistance Needed: +2 PT/OT  Co-Evaluation/Treatment: Yes    Subjective Data  Subjective: My whole body hurts. Patient Stated Goal: Not asked   Prior Functioning  Vision/Perception  Home Living Lives With: Alone Type of Home: House Home Access: Stairs to enter Entrance  Stairs-Number of Steps: 3 Home Layout: One level Bathroom Shower/Tub: Network engineer: None Additional Comments: Pt has son who will likely not be able to physically assist. Prior Function Level of Independence: Independent Able to Take Stairs?: Yes Communication Communication: No difficulties Dominant Hand: Right      Cognition  Overall Cognitive Status: No family/caregiver present to determine baseline cognitive functioning Arousal/Alertness: Awake/alert Behavior During Session: Anxious Cognition - Other Comments: Pt appears more oriented today.    Extremity/Trunk Assessment Right Upper Extremity Assessment RUE ROM/Strength/Tone: Deficits RUE ROM/Strength/Tone Deficits: strength at best 3/5 throughout Left Upper Extremity Assessment LUE ROM/Strength/Tone: Deficits LUE ROM/Strength/Tone Deficits: strength at best 3/5 throughout   Mobility  Shoulder Instructions  Bed Mobility Bed Mobility: Rolling Right Rolling Right: 1: +2 Total assist Rolling Right: Patient Percentage: 30% Supine to Sit: 1: +2 Total assist Supine to Sit: Patient Percentage: 30% Details for Bed Mobility Assistance: VC for flexing knees to prep to roll. TC to move Legs over edge. lifting assist to right truk. Transfers Sit to Stand: From bed Sit to Stand: Patient Percentage: 40% Stand to Sit: To chair/3-in-1;To bed Details for Transfer Assistance: Attempted to stand at [facility], pt was not able to completely stand nor stay standing. Used bilat. arm hold for pivot to recliner.       Exercise     Balance Static Sitting Balance Static Sitting - Balance Support: Bilateral upper extremity supported Static Sitting -  Level of Assistance: 2: Max assist;4: Min assist Static Sitting - Comment/# of Minutes: gradually able to sit w/ minassist after positioning on edge. Pt. sat x 5 minutes. much pain with pivot to recliner.   End of Session OT - End of Session Activity Tolerance: Patient limited by pain Patient left: in chair;with call bell/phone within reach Nurse Communication: Need for lift equipment  GO     Ephraim Reichel A OTR/L [phone] 01/12/2012, 10:39 AM

## 2012-01-12 NOTE — Progress Notes (Signed)
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  If wound output is high, would recommend replacement of wound vac.    Will reassess wound tomorrow.

## 2012-01-12 NOTE — Progress Notes (Signed)
6 Days Post-Op  Subjective: BP goes down with sedation/analgesic, but it's is normal with stimulation. I/O is + 14 ml.    Objective: Vital signs in last 24 hours: Temp:  [99.3 F (37.4 C)-100.9 F (38.3 C)] 99.4 F (37.4 C) (09/24 1200) Pulse Rate:  [85-101] 94  (09/24 1200) Resp:  [17-24] 23  (09/24 1200) BP: (77-113)/(43-64) 95/51 mmHg (09/24 1200) SpO2:  [90 %-97 %] 97 % (09/24 1200) Weight:  [75.6 kg (166 lb 10.7 oz)] 75.6 kg (166 lb 10.7 oz) (09/24 0000) Last BM Date: 01/11/12  Diet: NPO, TNA for nutrition, TM 100.9 last evening, BP 77-123.  WBC up some anemia about the same.  Intake/Output from previous day: 09/23 0701 - 09/24 0700 In: 3011.8 [I.V.:230; IV Piggyback:1004; TPN:1777.8] Out: 2998 [Urine:2835; Drains:163] Intake/Output this shift: Total I/O In: 965 [IV Piggyback:550; TPN:415] Out: 275 [Urine:275]  General appearance: alert, cooperative and no distress Resp: clear to auscultation bilaterally and anterior exam. GI: less complaints of pain, getting pain med every hour.  open wound with more white drainage at top of open wound,  no significant distension, tender, but not excessive. fascial sutures are also loose.  Lab Results:   Basename 01/12/12 0530 01/11/12 0540  WBC 12.8* 11.6*  HGB 7.5* 7.4*  HCT 23.0* 23.2*  PLT 465* 441*    BMET  Basename 01/12/12 0530 01/11/12 0540  NA 132* 133*  K 3.5 3.8  CL 102 102  CO2 22 24  GLUCOSE 138* 122*  BUN 13 16  CREATININE 0.54 0.51  CALCIUM 7.5* 7.6*   PT/INR No results found for this basename: LABPROT:2,INR:2 in the last 72 hours   Lab 01/11/12 0540 01/09/12 0510 01/07/12 0430  AST 28 55* 432*  ALT 83* 191* 575*  ALKPHOS 77 95 123*  BILITOT 0.3 0.3 0.3  PROT 5.6* 5.4* 5.0*  ALBUMIN 1.5* 1.4* 1.3*     Lipase     Component Value Date/Time   LIPASE 113* 01/11/2012 0540     Studies/Results: Dg Chest Port 1 View  01/12/2012  *RADIOLOGY REPORT*  Clinical Data: Extubation.  PORTABLE CHEST - 1  VIEW  Comparison: 01/11/2012.  Findings: Endotracheal tube removed.  Right IJ central line persists.  Decreased lung volumes are present, expected after extubation.  Persistent bilateral effusions and basilar atelectasis. Cardiomediastinal contours appear unchanged.  IMPRESSION: Interval extubation with expected lower lung volumes.  Right IJ line persists.   Original Report Authenticated By: Andreas Newport, M.D.    Dg Chest Port 1 View  01/11/2012  *RADIOLOGY REPORT*  Clinical Data: Endotracheal tube placement.  PORTABLE CHEST - 1 VIEW  Comparison: 01/09/2012.  Findings: Endotracheal tube tip is 30 mm from the carina.  Lung volumes are lower than on prior exam.  Retrocardiac density is present, retrocardiac density is present likely representing atelectasis. There may be a small left pleural effusion.  Discoid atelectasis over the right hemidiaphragm.  Right IJ central line is unchanged.  IMPRESSION:  1.  Stable support apparatus. 2.  Increasing bibasilar atelectasis.  Probable small left pleural effusion.   Original Report Authenticated By: Andreas Newport, M.D.     Medications:    . acetaminophen  1,000 mg Intravenous Q6H  . antiseptic oral rinse  15 mL Mouth Rinse QID  . chlorhexidine  15 mL Mouth Rinse BID  . folic acid  1 mg Intravenous Daily  . furosemide  40 mg Intravenous Once  . insulin aspart  2-6 Units Subcutaneous Q4H  . LORazepam  1 mg  Intravenous Q8H  . metronidazole  500 mg Intravenous Q8H  . pantoprazole (PROTONIX) IV  40 mg Intravenous Q12H  . piperacillin-tazobactam (ZOSYN)  IV  3.375 g Intravenous Q8H  . potassium chloride  10 mEq Intravenous Q1 Hr x 4    Assessment/Plan Ruptured pseudocyst with peritonitis, s/p Exploratory laparotomy with debridement of necrotic pancreas and drainage of the upper abdomen 01/03/12 Dr. Wenda Low. Post op VDRF on Vent,  extubated 01/11/12 Hx of pancreatitis, and alcohol abuse.  GERD Elevated liver function tests  GERD (gastroesophageal  reflux disease)  Hiatal Hernia 9/18 EGD ([facility]) - 10-12 mm ulcer in the cardia of the stomach, protonix gtt, removed panda    Plan:  We took vac off last PM because of white thick drainage coming thru open portion of the upper abdominal wound.  He has ongoing white thick liquid coming from the drains also.  Discussed with Dr. Donell Beers.  Continue wet to dry dressing, abx and bowel rest.  Doing well currently off vent.   LOS: 17 days    Yachet Mattson 01/12/2012

## 2012-01-12 NOTE — Clinical Social Work Note (Signed)
CSW continues to follow and discussed case with PT. CSW looking for SNF, but CIR may be option as Pt is only 55yo.  CSW to meet with Pt tomorrow to discuss further.   Doreen Salvage, LCSWA ICU/Stepdown Clinical Social Worker [name] Cell [phone] Hours 8am-1200pm M-F

## 2012-01-12 NOTE — Progress Notes (Signed)
Physical Therapy Treatment Patient Details Name: [name] MRN: [id number] DOB: [birth year] Today's Date: 01/12/2012 Time: 7829-5621 PT Time Calculation (min): 28 min  PT Assessment / Plan / Recommendation Comments on Treatment Session  Pt. continues to be very weak, painful. Pt did stand and pivot to recliner today. Recommend CIR consult as pt lives alone.    Follow Up Recommendations  Inpatient Rehab    Barriers to Discharge        Equipment Recommendations  Rolling walker with 5" wheels    Recommendations for Other Services OT consult  Frequency Min 3X/week   Plan Discharge plan needs to be updated;Frequency remains appropriate    Precautions / Restrictions Precautions Precaution Comments: 2 abd. drains, very weak   Pertinent Vitals/Pain 09/10 abdomen when transferring to recliner. To get more medication.    Mobility  Bed Mobility Bed Mobility: Rolling Right Rolling Right: 1: +2 Total assist Rolling Right: Patient Percentage: 30% Supine to Sit: 1: +2 Total assist Supine to Sit: Patient Percentage: 30% Details for Bed Mobility Assistance: VC for flexing knees to prep to roll. TC to move Legs over edge. lifting assist to right truk. Transfers Transfers: Sit to Stand;Stand to [employer] Transfers Sit to Stand: From bed Sit to Stand: Patient Percentage: 40% Stand to Sit: To chair/3-in-1;To bed Stand Pivot Transfers: 1: +2 Total assist Stand Pivot Transfers: Patient Percentage: 50% Details for Transfer Assistance: Attempted to stand at [facility], pt was not able to completely stand nor stay standing. Used bilat. arm hold for pivot to recliner. Ambulation/Gait Ambulation/Gait Assistance: Not tested (comment)    Exercises     PT Diagnosis:    PT Problem List:   PT Treatment Interventions:     PT Goals Acute Rehab PT Goals Pt will go Supine/Side to Sit: with min assist PT Goal: Supine/Side to Sit - Progress: Progressing toward goal Pt will go Sit to Stand:  with min assist PT Goal: Sit to Stand - Progress: Goal set today Pt will go Stand to Sit: with min assist PT Goal: Stand to Sit - Progress: Progressing toward goal Pt will Transfer Bed to Chair/Chair to Bed: with min assist PT Transfer Goal: Bed to Chair/Chair to Bed - Progress: Progressing toward goal  Visit Information  Last PT Received On: 01/12/12 Assistance Needed: +2    Subjective Data  Subjective: I can't get up w/ all of the lines   Cognition  Overall Cognitive Status: Impaired Arousal/Alertness: Awake/alert Behavior During Session: Anxious Cognition - Other Comments: Pt appears more oriented today.    Balance  Static Sitting Balance Static Sitting - Balance Support: Bilateral upper extremity supported Static Sitting - Level of Assistance: 2: Max assist;4: Min assist Static Sitting - Comment/# of Minutes: gradually able to sit w/ minassist after positioning on edge. Pt. sat x 5 minutes. much pain with pivot to recliner.  End of Session PT - End of Session Activity Tolerance: Patient limited by fatigue;Patient limited by pain Patient left: in chair;with call bell/phone within reach (on maxisky pad.) Nurse Communication: Need for lift equipment;Mobility status   GP     Rada Hay 01/12/2012, 9:45 AM

## 2012-01-12 NOTE — Progress Notes (Signed)
Name: [name] MRN: [id number] DOB: 1955-10-12    LOS: 17  Referring Provider:  [name] Reason for Referral:  Acute Respiratory Failure  PULMONARY / CRITICAL CARE MEDICINE  Brief patient profile:  55 yowf adm by [facility] via [facility] on 09/07 with acute pancreatitis. H/O ERCP with sphincterotomy on 10/06/2011. On 9/12  RRT was called for respiratory decline and lethargy, subsequently required mechanical ventilation & Exploratory laparotomy 9/15 with debridement of necrotic pancreas and drainage.   Events Since Admission: 9/12 RRT and subsequent transfer to the ICU. 9/14 TFs held due to firm abdomen & sub glottic suctioning, WC higher 9/14 CT abd:  increased ascites, Persistent edema/inflammation surrounding the head of the pancreas, with an approximate 1.8 x 2.6 cm pseudocyst at the junction of the head and body. Developing pseudocyst in the pancreatic head measuring approximately 1.3 x 2.6 cm, Large hiatal hernia.  9/14 doppler UE neg for DVT 9/15 1.5 L paracentesis - green brown fluid 9/15 Exploratory laparotomy with debridement of necrotic pancreas and drainage of the upper abdomen 9/18 EGD (Ganem) - 10-12 mm ulcer in the cardia of the stomach, protonix gtt, removed panda 9/20 CT abd: Decrease in moderate abdominal and pelvic ascites with intraperitoneal drainage catheters now seen in place. Mild increase in small bilateral pleural effusions. No significant change in diffuse body wall edema. Persistent small pseudocyst in the area the pancreatic head, which is decreased in size since earlier exam 9/23 extubated 9/24 dc fentanyl and start dilaudid.  9/24 change to SDU status. Current Status: Remains  NAD   Vital Signs: Temp:  [98.9 F (37.2 C)-100.9 F (38.3 C)] 99.7 F (37.6 C) (09/24 0800) Pulse Rate:  [85-101] 96  (09/24 0700) Resp:  [16-24] 19  (09/24 0700) BP: (77-113)/(41-60) 92/44 mmHg (09/24 0700) SpO2:  [91 %-99 %] 93 % (09/24 0700) FiO2 (%):  [2 %] 2 % (09/23  1100) Weight:  [75.6 kg (166 lb 10.7 oz)] 75.6 kg (166 lb 10.7 oz) (09/24 0000)  Physical Examination: General: chronically ill  appearance. HEENT: WNL Cardiovascular:  RRR s M.  Pulmonary/Chest: clear anteriorly, decreased bs bases Abdominal: Mildly distended but soft. Mildly tender diffusely. Hypeoactive bowel sounds. Midline lap incision with 2 JP drains Musculoskeletal: 1-2+ edema and no tenderness.  Neurological: no focal deficits, reports less pain   Principal Problem:  *Acute pancreatitis Active Problems:  Alcohol abuse  Epigastric abdominal pain  Leukocytosis  Hyponatremia  Acute respiratory failure  Shock  ARDS (adult respiratory distress syndrome)  C. difficile colitis   ASSESSMENT AND PLAN  PULMONARY  ETT 9/12>>>9/23  CXR:  Dg Chest Port 1 View  01/12/2012  *RADIOLOGY REPORT*  Clinical Data: Extubation.  PORTABLE CHEST - 1 VIEW  Comparison: 01/11/2012.  Findings: Endotracheal tube removed.  Right IJ central line persists.  Decreased lung volumes are present, expected after extubation.  Persistent bilateral effusions and basilar atelectasis. Cardiomediastinal contours appear unchanged.  IMPRESSION: Interval extubation with expected lower lung volumes.  Right IJ line persists.   Original Report Authenticated By: Andreas Newport, M.D.      A: 1)  VDRF post lap -  2) ARDS resolved 2)  Probable PNA HCAP vs Aspiration.       P:   Monitor closely post- extubation   CARDIOVASCULAR ECG:   No new EKG Lines:  RIJ 9/13 >>  A: Septic shock, resolved P: Monitor    RENAL  Lab 01/12/12 0530 01/11/12 0540 01/10/12 0500 01/09/12 0510 01/08/12 1620 01/08/12 0415 01/07/12 0430  NA 132* 133* 135 135 137 -- --  K 3.5 3.8 -- -- -- -- --  CL 102 102 103 101 102 -- --  CO2 22 24 25 26 27  -- --  BUN 13 16 15 13 12  -- --  CREATININE 0.54 0.51 0.50 0.52 0.56 -- --  CALCIUM 7.5* 7.6* 7.5* 7.4* 7.3* -- --  MG -- 1.8 1.8 -- -- -- 2.4  PHOS -- 3.1 2.9 2.9 -- 2.2* 1.7*     A: Acute renal failure, resolved     Hyponatremia: resolved     Hypokalemia, resolved  P:  Continue to keep negative fluid balance as tolerates but bp runs low as does K.       9/23 one dose of lasix + 3 runs of kcl  Intake/Output Summary (Last 24 hours) at 01/12/12 0906 Last data filed at 01/12/12 0600  Gross per 24 hour  Intake 2596.75 ml  Output   2748 ml  Net -151.25 ml    GASTROINTESTINAL  Lab 01/11/12 0540 01/09/12 0510 01/07/12 0430  AST 28 55* 432*  ALT 83* 191* 575*  ALKPHOS 77 95 123*  BILITOT 0.3 0.3 0.3  PROT 5.6* 5.4* 5.0*  ALBUMIN 1.5* 1.4* 1.3*   Lipase     Component Value Date/Time   LIPASE 113* 01/11/2012 0540    A:  Acute Pancreatitis - lipase levels  improved, liver function test normal       Protein calorie malnutrition       Guiaic pos -NG dc'd due to gastric ulcer       Shock Liver -improving P: protonix gtt -changed to q 12h     cont TNA - begin POs when OK per CCS       HEMATOLOGIC  Lab 01/12/12 0530 01/11/12 0540 01/10/12 0830 01/09/12 0510 01/08/12 0415  HGB 7.5* 7.4* 7.8* 8.0* 11.5*  HCT 23.0* 23.2* 24.2* 24.2* 35.9*  PLT 465* 441* 478* 338 277  INR -- -- -- -- --  APTT -- -- -- -- --   A:  Mild Anemia - ICU acquired anemia P:    Holding sq lovenox due to hb drop/ gastric ulcer , use SCDs for dvt proph -high risk Transfuse for Hgb<7.0 if not actively bleeding   INFECTIOUS  Lab 01/12/12 0530 01/11/12 0540 01/10/12 0830 01/09/12 0510 01/08/12 0415  WBC 12.8* 11.6* 10.4 13.7* 10.5  PROCALCITON -- -- -- -- --   Cultures: Blood - 9/11>>>ng Urine - 9/8 - Multiple bacterial morphotypes c diff pcr 9/14 >> POS 9/15 ascites >>ng  Antibiotics: Zosyn 9/11>>> Vanc 9/12>>>9/15 Flagyl 9/15 >>  A: Septic shock -resolved     UTI - positive for leuks, and nitrites, cultures likely contaminated     C diff colitis     Peritonitis  P:   Continue empiric zosyn, low threshold to add fungal coverage but improved WC       Ct IV  Flagyl for C diff - consider enteral flagyl or vanc when taking POs        Abd drains per CCS   ENDOCRINE CBG (last 3)   Basename 01/12/12 0748 01/11/12 2351 01/11/12 2015  GLUCAP 158* 128* 140*   A: No issues currently   P: CBG Q4H r/t high risk for hyperglycemia  NEUROLOGIC A: Alcohol withdrawal/ acute encephalopathy -resolved, ammonia nml     Pain r/t acute pancreatitis     Chronic narcotic dependence  P:  int fentanyl  Switch to dilaudid 9/24            [facility] Minor ACNP Adolph Pollack PCCM Pager [phone] till 3 pm If no answer page [phone] 01/12/2012, 9:01 AM     Seen on CCM rounds this morning with resident MD or ACNP above.  Pt examined and database reviewed. I agree with above findings, assessment and plan as reflected in the note above. Tolerating extubation well  Billy Fischer, MD;  PCCM service; Mobile [phone]

## 2012-01-13 ENCOUNTER — Inpatient Hospital Stay (HOSPITAL_COMMUNITY): Payer: MEDICAID

## 2012-01-13 LAB — GLUCOSE, CAPILLARY
Glucose-Capillary: 138 mg/dL — ABNORMAL HIGH (ref 70–99)
Glucose-Capillary: 129 mg/dL — ABNORMAL HIGH (ref 70–99)
Glucose-Capillary: 152 mg/dL — ABNORMAL HIGH (ref 70–99)

## 2012-01-13 LAB — CBC
HCT: 22.3 % — ABNORMAL LOW (ref 36.0–46.0)
MCH: 28.4 pg (ref 26.0–34.0)
MCHC: 32.7 g/dL (ref 30.0–36.0)
MCV: 86.8 fL (ref 78.0–100.0)
Platelets: 506 10*3/uL — ABNORMAL HIGH (ref 150–400)
RDW: 17.1 % — ABNORMAL HIGH (ref 11.5–15.5)
WBC: 14.7 10*3/uL — ABNORMAL HIGH (ref 4.0–10.5)

## 2012-01-13 LAB — BASIC METABOLIC PANEL
BUN: 13 mg/dL (ref 6–23)
CO2: 21 mEq/L (ref 19–32)
Calcium: 7.7 mg/dL — ABNORMAL LOW (ref 8.4–10.5)
Creatinine, Ser: 0.54 mg/dL (ref 0.50–1.10)

## 2012-01-13 MED ORDER — ZINC TRACE METAL 1 MG/ML IV SOLN
INTRAVENOUS | Status: AC
  Administered 2012-01-13: 18:00:00 via INTRAVENOUS
  Filled 2012-01-13: qty 2000

## 2012-01-13 MED ORDER — HYDROMORPHONE HCL PF 1 MG/ML IJ SOLN
0.5000 mg | Freq: Once | INTRAMUSCULAR | Status: AC
Start: 2012-01-13 — End: 2012-01-13
  Administered 2012-01-13: 0.5 mg via INTRAVENOUS

## 2012-01-13 MED ORDER — OCTREOTIDE ACETATE 100 MCG/ML IJ SOLN
100.0000 ug | Freq: Three times a day (TID) | INTRAMUSCULAR | Status: DC
  Administered 2012-01-13 – 2012-02-02 (×54): 100 ug via SUBCUTANEOUS
  Filled 2012-01-13 (×78): qty 1

## 2012-01-13 MED ORDER — FAT EMULSION 20 % IV EMUL
240.0000 mL | INTRAVENOUS | Status: AC
  Administered 2012-01-13: 240 mL via INTRAVENOUS
  Filled 2012-01-13: qty 250

## 2012-01-13 NOTE — Progress Notes (Signed)
Physical Therapy Treatment Patient Details Name: [name] MRN: [id number] DOB: 1956/04/15 Today's Date: 01/13/2012 Time: 1191-4782 PT Time Calculation (min): 29 min  PT Assessment / Plan / Recommendation Comments on Treatment Session  Pt. is improving in mobility and standing and pivoting. Pt  c/o increased pain with standing up. Pt. will benfit from post acute rehab, possibly CIR.     Follow Up Recommendations  Inpatient Rehab    Barriers to Discharge        Equipment Recommendations  Rolling walker with 5" wheels;3 in 1 bedside comode    Recommendations for Other Services OT consult  Frequency Min 3X/week   Plan Discharge plan remains appropriate;Frequency remains appropriate    Precautions / Restrictions Precautions Precautions: Fall Precaution Comments: 2 abd. drains, very weak  Restrictions Weight Bearing Restrictions: No   Pertinent Vitals/Pain Max HR 115 sats 96%1.5  pain 8/10 with premedication. stated pain less after in recliner and not moving.    Mobility  Bed Mobility Rolling Right: 3: Mod assist;With rail Supine to Sit: 2: Max assist Supine to Sit: Patient Percentage: 30% Details for Bed Mobility Assistance: verbal cues to roll and reach for rail. required assirstance to get to a siiting postion to protect abdomen. Transfers Transfers: Sit to Stand;Stand to Sit Sit to Stand: 1: +2 Total assist;From bed Sit to Stand: Patient Percentage: 50% Stand to Sit: To chair/3-in-1 Stand Pivot Transfers: 1: +2 Total assist Stand Pivot Transfers: Patient Percentage: 50% Details for Transfer Assistance: Pt  stood better , continues to have increased pain upon standing which appears to  make pivoting more difficult.  Pt . used RW for support today. Ambulation/Gait Ambulation/Gait Assistance: Not tested (comment)    Exercises General Exercises - Lower Extremity Ankle Circles/Pumps: AROM;Right;Left;10 reps;Supine Short Arc Quad: AROM;Right;Left;10  reps;Supine Heel Slides: AROM;Both;10 reps;Supine   PT Diagnosis:    PT Problem List:   PT Treatment Interventions:     PT Goals Acute Rehab PT Goals Pt will go Supine/Side to Sit: with min assist PT Goal: Supine/Side to Sit - Progress: Progressing toward goal Pt will go Sit to Supine/Side: with min assist PT Goal: Sit to Supine/Side - Progress: Progressing toward goal Pt will go Sit to Stand: with min assist PT Goal: Sit to Stand - Progress: Progressing toward goal Pt will go Stand to Sit: with min assist PT Goal: Stand to Sit - Progress: Progressing toward goal Pt will Transfer Bed to Chair/Chair to Bed: with min assist PT Transfer Goal: Bed to Chair/Chair to Bed - Progress: Progressing toward goal  Visit Information  Last PT Received On: 01/13/12 Assistance Needed: +2    Subjective Data  Subjective: I will try.   Cognition  Overall Cognitive Status: Difficult to assess Arousal/Alertness: Awake/alert Behavior During Session: Anxious    Balance  Static Sitting Balance Static Sitting - Balance Support: Feet supported;Bilateral upper extremity supported Static Sitting - Level of Assistance: 4: Min assist Static Sitting - Comment/# of Minutes:  pt. was able to gain sitting balance  today w/ less support initiially, could scoot to edge of bed with min assistance  End of Session PT - End of Session Activity Tolerance: Patient limited by pain Patient left: in chair;with call bell/phone within reach (on lift pad.) Nurse Communication: Mobility status;Need for lift equipment   GP     Rada Hay 01/13/2012, 10:30 AM

## 2012-01-13 NOTE — Progress Notes (Signed)
ANTIBIOTIC CONSULT NOTE - FOLLOW UP  Pharmacy Consult for Zosyn Indication:  Pancreatitis, PNA  Allergies  Allergen Reactions  . Ciprofloxacin Nausea Only    REACTION: nausea    Patient Measurements: Height: 5\' 6"  (167.6 cm) Weight: 166 lb 10.7 oz (75.6 kg) IBW/kg (Calculated) : 59.3   Vital Signs: Temp: 100.1 F (37.8 C) (09/25 0800) Temp src: Oral (09/25 0800) BP: 122/69 mmHg (09/25 1000) Pulse Rate: 103  (09/25 1000) Intake/Output from previous day: 09/24 0701 - 09/25 0700 In: 2640 [I.V.:180; IV Piggyback:850; TPN:1590] Out: 2382 [Urine:2375; Drains:7] Intake/Output from this shift: Total I/O In: 392.5 [I.V.:80; IV Piggyback:12.5; TPN:300] Out: -   Labs:  Basename 01/13/12 0329 01/12/12 0530 01/11/12 0540  WBC 14.7* 12.8* 11.6*  HGB 7.3* 7.5* 7.4*  PLT 506* 465* 441*  LABCREA -- -- --  CREATININE 0.54 0.54 0.51   Estimated Creatinine Clearance: 81.6 ml/min (by C-G formula based on Cr of 0.54). No results found for this basename: VANCOTROUGH:2,VANCOPEAK:2,VANCORANDOM:2,GENTTROUGH:2,GENTPEAK:2,GENTRANDOM:2,TOBRATROUGH:2,TOBRAPEAK:2,TOBRARND:2,AMIKACINPEAK:2,AMIKACINTROU:2,AMIKACIN:2, in the last 72 hours   Cultures 9/8 urine >> 45K mult organisms  9/11 blood x 2 >> NG-F 9/12 mrsa pcr >> negative 9/13 urine >> NG-F 9/13 Blood/fungus >> ngtd 9/13 Trach aspirate >> nl flora 9/13 Urine >> NG-F 9/14 CDiff >> Positive 9/15 Abd fluid/paracentesis x 2 >> ngtd 9/16 Blood x 2 >> ngtd  9/16 Urine >> ng-F 9/16 resp/endotrach >> ngtd    Assessment:  55 yof on Day#14 Zosyn for septic shock (ruptured pancreatitic pseudocyst, HCAP vs aspiration), also on Day#10 of Flagyl for C.diff.    WBC rising some.  Scr remains stable and wnl (improved from 9/16 and 9/17)  9/17 CXR = Increased bibasilar atelectasis  Plan:   Continue Zosyn 3.375 gm IV q8h  Pharmacy will sign off   Hessie Knows, PharmD, BCPS Pager [phone] 01/13/2012 11:37 AM;

## 2012-01-13 NOTE — Progress Notes (Signed)
Name: [name] MRN: [id number] DOB: Mar 30, 1956    LOS: 18  Referring Provider:  [name] Reason for Referral:  Acute Respiratory Failure  PULMONARY / CRITICAL CARE MEDICINE  Brief patient profile:  55 yowf adm by [facility] via [facility] on 09/07 with acute pancreatitis. H/O ERCP with sphincterotomy on 10/06/2011. On 9/12  RRT was called for respiratory decline and lethargy, subsequently required mechanical ventilation & Exploratory laparotomy 9/15 with debridement of necrotic pancreas and drainage.   Events Since Admission: 9/12 RRT and subsequent transfer to the ICU. 9/14 TFs held due to firm abdomen & sub glottic suctioning, WC higher 9/14 CT abd:  increased ascites, Persistent edema/inflammation surrounding the head of the pancreas, with an approximate 1.8 x 2.6 cm pseudocyst at the junction of the head and body. Developing pseudocyst in the pancreatic head measuring approximately 1.3 x 2.6 cm, Large hiatal hernia.  9/14 doppler UE neg for DVT 9/15 1.5 L paracentesis - green brown fluid 9/15 Exploratory laparotomy with debridement of necrotic pancreas and drainage of the upper abdomen 9/18 EGD (Ganem) - 10-12 mm ulcer in the cardia of the stomach, protonix gtt, removed panda 9/20 CT abd: Decrease in moderate abdominal and pelvic ascites with intraperitoneal drainage catheters now seen in place. Mild increase in small bilateral pleural effusions. No significant change in diffuse body wall edema. Persistent small pseudocyst in the area the pancreatic head, which is decreased in size since earlier exam 9/23 extubated 9/24 dc fentanyl and start dilaudid.  9/24 change to SDU status. Current Status: Remains  NAD   Vital Signs: Temp:  [98.5 F (36.9 C)-100.4 F (38 C)] 100.4 F (38 C) (09/25 0400) Pulse Rate:  [88-107] 98  (09/25 0600) Resp:  [18-24] 20  (09/25 0600) BP: (82-131)/(42-84) 119/56 mmHg (09/25 0600) SpO2:  [90 %-97 %] 94 % (09/25 0600)  Physical Examination: General:  chronically ill  appearance. HEENT: WNL Cardiovascular:  RRR s M.  Pulmonary/Chest: clear anteriorly, decreased bs bases Abdominal: Mildly distended but soft. Mildly tender diffusely. Hypeoactive bowel sounds. Midline lap incision with 2 JP drains with green drainage  Musculoskeletal: 1-2+ edema and no tenderness.  Neurological: no focal deficits, reports less pain   Principal Problem:  *Acute pancreatitis Active Problems:  Alcohol abuse  Epigastric abdominal pain  Leukocytosis  Hyponatremia  Acute respiratory failure  Shock  ARDS (adult respiratory distress syndrome)  C. difficile colitis  S/P laparotomy   ASSESSMENT AND PLAN  PULMONARY  ETT 9/12>>>9/23  CXR:  Dg Chest Port 1 View  9/25 no change    A: 1)  VDRF post lap -  2) ARDS resolved 2)  Probable PNA HCAP vs Aspiration.       P:   Monitor closely post- extubation   CARDIOVASCULAR ECG:   No new EKG Lines:  RIJ 9/13 >>  A: Septic shock, resolved P: Monitor    RENAL  Lab 01/13/12 0329 01/12/12 0530 01/11/12 0540 01/10/12 0500 01/09/12 0510 01/08/12 0415 01/07/12 0430  NA 132* 132* 133* 135 135 -- --  K 3.6 3.5 -- -- -- -- --  CL 102 102 102 103 101 -- --  CO2 21 22 24 25 26  -- --  BUN 13 13 16 15 13  -- --  CREATININE 0.54 0.54 0.51 0.50 0.52 -- --  CALCIUM 7.7* 7.5* 7.6* 7.5* 7.4* -- --  MG 1.7 -- 1.8 1.8 -- -- 2.4  PHOS 2.9 -- 3.1 2.9 2.9 2.2* --    A: Acute renal failure, resolved  Hyponatremia: resolved     Hypokalemia, resolved  P:  Continue to keep negative fluid balance as tolerates but bp runs low as does K.       9/23 one dose of lasix + 3 runs of kcl  Intake/Output Summary (Last 24 hours) at 01/13/12 0825 Last data filed at 01/13/12 0700  Gross per 24 hour  Intake   2355 ml  Output   2307 ml  Net     48 ml    GASTROINTESTINAL  Lab 01/11/12 0540 01/09/12 0510 01/07/12 0430  AST 28 55* 432*  ALT 83* 191* 575*  ALKPHOS 77 95 123*  BILITOT 0.3 0.3 0.3  PROT 5.6* 5.4*  5.0*  ALBUMIN 1.5* 1.4* 1.3*   Lipase     Component Value Date/Time   LIPASE 113* 01/11/2012 0540    A:  Acute Pancreatitis - lipase levels  improved, liver function test normal       Protein calorie malnutrition       Guiaic pos -NG dc'd due to gastric ulcer       Shock Liver -improving P: protonix gtt -changed to q 12h     cont TNA - begin POs when OK per CCS     9/25 consider transfer to floor  HEMATOLOGIC  Lab 01/13/12 0329 01/12/12 0530 01/11/12 0540 01/10/12 0830 01/09/12 0510  HGB 7.3* 7.5* 7.4* 7.8* 8.0*  HCT 22.3* 23.0* 23.2* 24.2* 24.2*  PLT 506* 465* 441* 478* 338  INR -- -- -- -- --  APTT -- -- -- -- --   A:  Mild Anemia - ICU acquired anemia P:    Holding sq lovenox due to hb drop/ gastric ulcer , use SCDs for dvt proph -high risk Transfuse for Hgb<7.0 if not actively bleeding   INFECTIOUS  Lab 01/13/12 0329 01/12/12 0530 01/11/12 0540 01/10/12 0830 01/09/12 0510  WBC 14.7* 12.8* 11.6* 10.4 13.7*  PROCALCITON -- -- -- -- --   Cultures: Blood - 9/11>>>ng Urine - 9/8 - Multiple bacterial morphotypes c diff pcr 9/14 >> POS 9/15 ascites >>ng  Antibiotics: Zosyn 9/11>>> Vanc 9/12>>>9/15 Flagyl 9/15 >>  A: Septic shock -resolved     UTI - positive for leuks, and nitrites, cultures likely contaminated     C diff colitis     Peritonitis  P:   Continue empiric zosyn, low threshold to add fungal coverage but improved WC       Ct IV Flagyl for C diff - consider enteral flagyl or vanc when taking POs        Abd drains per CCS   ENDOCRINE CBG (last 3)   Basename 01/13/12 0323 01/12/12 2359 01/12/12 2046  GLUCAP 125* 139* 118*   A: No issues currently   P: CBG Q4H r/t high risk for hyperglycemia  NEUROLOGIC A: Alcohol withdrawal/ acute encephalopathy -resolved, ammonia nml     Pain r/t acute pancreatitis     Chronic narcotic dependence  P:  int fentanyl         Switch to dilaudid 9/24            [facility] Minor ACNP Adolph Pollack PCCM Pager  [phone] till 3 pm If no answer page [phone] 01/13/2012, 8:25 AM   Transfer out of ICU/SDU and to [facility],The service in AM 9/26. PCCM will sign off as of 9/26 AM. Please call if we can be of further assistance  Billy Fischer, MD ; [facility] [phone].  After 5:30 PM or weekends, call [phone]

## 2012-01-13 NOTE — Progress Notes (Signed)
Report given to Trina,RN on 5W will get ready to transport.

## 2012-01-13 NOTE — Progress Notes (Signed)
eLink Physician-Brief Progress Note Patient Name: [name] DOB: 1955-09-26 MRN: [id number]  Date of Service  01/13/2012   HPI/Events of Note   Pain after dressing change  eICU Interventions  Additional dil 0.5   Intervention Category Minor Interventions: Agitation / anxiety - evaluation and management;Routine modifications to care plan (e.g. PRN medications for pain, fever)  Nelda Bucks. 01/13/2012, 5:35 PM

## 2012-01-13 NOTE — Progress Notes (Signed)
Introduced spiritual care as resource on [employer].  Pt receptive to chaplain follow up.  Will continue to follow during admission.

## 2012-01-13 NOTE — Progress Notes (Signed)
PARENTERAL NUTRITION CONSULT NOTE - FOLLOW UP  Pharmacy Consult for TNA Indication: Severe Pancreatitis  Allergies  Allergen Reactions  . Ciprofloxacin Nausea Only    REACTION: nausea   Patient Measurements: Height: 5\' 6"  (167.6 cm) Weight: 166 lb 10.7 oz (75.6 kg) IBW/kg (Calculated) : 59.3   Vital Signs: Temp: 100.1 F (37.8 C) (09/25 0800) Temp src: Oral (09/25 0800) BP: 119/56 mmHg (09/25 0600) Pulse Rate: 98  (09/25 0600) Intake/Output from previous day: 09/24 0701 - 09/25 0700 In: 2640 [I.V.:180; IV Piggyback:850; TPN:1590] Out: 2382 [Urine:2375; Drains:7] Intake/Output from this shift: Total I/O In: 170 [I.V.:20; TPN:150] Out: -   Labs:  Basename 01/13/12 0329 01/12/12 0530 01/11/12 0540  WBC 14.7* 12.8* 11.6*  HGB 7.3* 7.5* 7.4*  HCT 22.3* 23.0* 23.2*  PLT 506* 465* 441*  APTT -- -- --  INR -- -- --    Basename 01/13/12 0329 01/12/12 0530 01/11/12 0545 01/11/12 0540  NA 132* 132* -- 133*  K 3.6 3.5 -- 3.8  CL 102 102 -- 102  CO2 21 22 -- 24  GLUCOSE 124* 138* -- 122*  BUN 13 13 -- 16  CREATININE 0.54 0.54 -- 0.51  LABCREA -- -- -- --  CREAT24HRUR -- -- -- --  CALCIUM 7.7* 7.5* -- 7.6*  MG 1.7 -- -- 1.8  PHOS 2.9 -- -- 3.1  PROT -- -- -- 5.6*  ALBUMIN -- -- -- 1.5*  AST -- -- -- 28  ALT -- -- -- 83*  ALKPHOS -- -- -- 77  BILITOT -- -- -- 0.3  BILIDIR -- -- -- --  IBILI -- -- -- --  PREALBUMIN -- -- -- 12.2*  TRIG -- -- 128 --  CHOLHDL -- -- -- --  CHOL -- -- 81 --   Estimated Creatinine Clearance: 81.6 ml/min (by C-G formula based on Cr of 0.54).    Basename 01/13/12 0835 01/13/12 0323 01/12/12 2359  GLUCAP 138* 125* 139*   Insulin Requirements in the past 24 hours:   CBGs <150, required 12 units of ICU HG protocol last 24  Nutritional Goals:   RD recs 9/16: 1762 kcal, 86-95 grams protein  Reestimated s/p extubation 9/23: 1475-1775 kcal, 94-110 grams protein  Clinimix 5/15 @ 43ml/hr + Lipid 20% MWF will provide 96 g  protein/day, 1843 kCal on lipid days, 1363 Kcal on non-lipid days for an average of 1569 Kcal per day/week.  Current nutrition:   Diet: NPO except ice chips  IVF: NS @ KVO  Clinimix E 5/20 @ 75 ml/hr  Assessment:   [name] admitted 9/8 with epigastric abd pain, fever, chills and N/V. Pt with h/o recurrent pancreatitis and ETOH abuse, s/p ERCP with sphincterotomy on 10/06/2011. Dx with acute pancreatitis this admit, developed ileus.   Rapid response called 9/12 for respiratory decline and lethargy - pt transfer to ICU and CCM consulted. Intubated 9/12. 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   Clinical deterioration with rising WBC and findings suggesting GI perforation, underwent exploratory laparotomy with debridement of necrotic pancreas and drainage of infected, ruptured pseudocyst 9/15. TNA started 9/16.   Possible conversion to enteral feeds now that extubated  Renal: Scr wnl Hepatic fxn: AST/ALT previously elevated ?shock liver 2/2 pancreatitis. Now continues to improve per 9/23 labs Lytes: K+ remains will aggressive replacement. Na+ low but unable to adjust amount in TNA Pre-Alb: improving, 2.3 (9/17), 12.2 (9/23) TG/Cholesterol: wnl 9/17, 9/23 CBGs: At goal <150  Plan:    Will change Clinimix to E 5/15 at 80 ml/hr to provide new dietitian goals including increased protein needs to further help with wound healing  PO advancement per CCS  TNA to contain IV fat emulsion, standard multivitamins and trace elements only on MWF only due to ongoing shortage   TNA labs Monday/Thursdays   Hessie Knows, PharmD, BCPS Pager [phone] 01/13/2012 10:17 AM

## 2012-01-13 NOTE — Clinical Social Work Note (Signed)
CSW met with Pt to reassess needs now that she was extubated. Pt lives alone and has a limited support network consisting of one son.  She appears to understand that she will need skilled PT, however is currently self pay. She stated she has not applied for medicaid to date, but has worked with [facility] in the past. [place]. Counselor plans to see her later to day to assist with medicaid application. CSW will begin LOG process for SNF. SNF placement will be difficult as most SNFs that accept LOG do not do TNA.  CSW discussed ETOH use with Pt. She states she was drinking heavily prior to this admission. She stated she appears to understand that she can no longer drink. Per her son, Pt has had several admissions were she was on the vent due to ETOH use. This appears to be a pattern. Pt not interested in inpt rehab for ETOH currently.  CSW to begin SNF search and LOG process.   Doreen Salvage, LCSWA ICU/Stepdown Clinical Social Worker [facility] Cell [phone] Hours 8am-1200pm M-F

## 2012-01-13 NOTE — Progress Notes (Signed)
Change back to wound vac to see if controls wound drainage better.   Keep NPO.   Add sandostatin.

## 2012-01-13 NOTE — Progress Notes (Signed)
7 Days Post-Op  Subjective: Says she feels bad, didn't get up much yesterday,  Complaining of pain issues a great deal.  Objective: Vital signs in last 24 hours: Temp:  [98.5 F (36.9 C)-100.4 F (38 C)] 100.4 F (38 C) (09/25 0400) Pulse Rate:  [88-107] 98  (09/25 0600) Resp:  [17-24] 20  (09/25 0600) BP: (82-131)/(42-84) 119/56 mmHg (09/25 0600) SpO2:  [90 %-97 %] 94 % (09/25 0600) Last BM Date: 01/11/12  Nothing recorded from Drain, but yesterday afternoon they were full of ongoing white-green tinged cloudy thick fluid. Diet: NPO/TNA for nutrition, +265 ml yesterday. Low grade fever, 99.2 - 100.4, BP stable. WBC slowly rising, H/H stable K+ 3.6, she got 4 runs of K+ yesterday. CXR  shows no change from yesterday.  Intake/Output from previous day: 09/24 0701 - 09/25 0700 In: 2640 [I.V.:180; IV Piggyback:850; TPN:1590] Out: 2375 [Urine:2375] Intake/Output this shift:    General appearance: alert and no distress Resp: clear to auscultation bilaterally and anterior exam. GI: open wound still has ongoing white thick drainage, loose fascial sutrures, with white fibrous base of open wound.  BS hypoactive.  Lab Results:   Basename 01/13/12 0329 01/12/12 0530  WBC 14.7* 12.8*  HGB 7.3* 7.5*  HCT 22.3* 23.0*  PLT 506* 465*    BMET  Basename 01/13/12 0329 01/12/12 0530  NA 132* 132*  K 3.6 3.5  CL 102 102  CO2 21 22  GLUCOSE 124* 138*  BUN 13 13  CREATININE 0.54 0.54  CALCIUM 7.7* 7.5*   PT/INR No results found for this basename: LABPROT:2,INR:2 in the last 72 hours   Lab 01/11/12 0540 01/09/12 0510 01/07/12 0430  AST 28 55* 432*  ALT 83* 191* 575*  ALKPHOS 77 95 123*  BILITOT 0.3 0.3 0.3  PROT 5.6* 5.4* 5.0*  ALBUMIN 1.5* 1.4* 1.3*     Lipase     Component Value Date/Time   LIPASE 113* 01/11/2012 0540     Studies/Results: Dg Chest Port 1 View  01/13/2012  *RADIOLOGY REPORT*  Clinical Data: Atelectasis.  PORTABLE CHEST - 1 VIEW  Comparison:  01/12/2012.  Findings: Right IJ central line appears unchanged.  Low lung volumes are present with greater than left basilar atelectasis. Drain projects over the left chest and left upper quadrant. Cardiomediastinal contours are unchanged.  IMPRESSION: No interval change.   Original Report Authenticated By: Andreas Newport, M.D.    Dg Chest Port 1 View  01/12/2012  *RADIOLOGY REPORT*  Clinical Data: Extubation.  PORTABLE CHEST - 1 VIEW  Comparison: 01/11/2012.  Findings: Endotracheal tube removed.  Right IJ central line persists.  Decreased lung volumes are present, expected after extubation.  Persistent bilateral effusions and basilar atelectasis. Cardiomediastinal contours appear unchanged.  IMPRESSION: Interval extubation with expected lower lung volumes.  Right IJ line persists.   Original Report Authenticated By: Andreas Newport, M.D.     Medications:    . acetaminophen  1,000 mg Intravenous Q6H  . antiseptic oral rinse  15 mL Mouth Rinse QID  . chlorhexidine  15 mL Mouth Rinse BID  . folic acid  1 mg Intravenous Daily  . furosemide  40 mg Intravenous Once  . insulin aspart  2-6 Units Subcutaneous Q4H  . LORazepam  1 mg Intravenous Q8H  . metronidazole  500 mg Intravenous Q8H  . pantoprazole (PROTONIX) IV  40 mg Intravenous Q12H  . piperacillin-tazobactam (ZOSYN)  IV  3.375 g Intravenous Q8H  . potassium chloride  10 mEq Intravenous Q1 Hr x  4    Assessment/Plan Ruptured pseudocyst with peritonitis, s/p Exploratory laparotomy with debridement of necrotic pancreas and drainage of the upper abdomen 01/03/12 Dr. Wenda Low.  Post op VDRF on Vent, extubated 01/11/12  Hx of pancreatitis, and alcohol abuse.  GERD  Elevated liver function tests  GERD (gastroesophageal reflux disease)  Hiatal Hernia  9/18 EGD (Ganem) - 10-12 mm ulcer in the cardia of the stomach, protonix gtt, removed panda    Plan:  Dr.Byerly will decide on placing Wound Vac back on.  Drainage from the abdominal drains also  continue to have thick white-green tinted drainage.. She is currently stable hemodynamically. PT is seeing.    LOS: 18 days    [name] 01/13/2012

## 2012-01-13 NOTE — Progress Notes (Signed)
Incision noted to abd measurements length 15cm width top 5cm bottom 5.5cm depth top 2.5 depth bottom 3cm, tunneling at 1 o'clock 3.5 cm area is pink with slight red tissue some yellow noted at top and bottom of incision.  Area with bridge like area in the center.   Wound vac applied as ordered.

## 2012-01-14 DIAGNOSIS — K219 Gastro-esophageal reflux disease without esophagitis: Secondary | ICD-10-CM

## 2012-01-14 DIAGNOSIS — D72829 Elevated white blood cell count, unspecified: Secondary | ICD-10-CM

## 2012-01-14 LAB — GLUCOSE, CAPILLARY
Glucose-Capillary: 140 mg/dL — ABNORMAL HIGH (ref 70–99)
Glucose-Capillary: 135 mg/dL — ABNORMAL HIGH (ref 70–99)

## 2012-01-14 LAB — COMPREHENSIVE METABOLIC PANEL
Albumin: 1.5 g/dL — ABNORMAL LOW (ref 3.5–5.2)
Alkaline Phosphatase: 96 U/L (ref 39–117)
BUN: 12 mg/dL (ref 6–23)
Calcium: 7.9 mg/dL — ABNORMAL LOW (ref 8.4–10.5)
Creatinine, Ser: 0.5 mg/dL (ref 0.50–1.10)
GFR calc Af Amer: >90 mL/min (ref >90)
Glucose, Bld: 114 mg/dL — ABNORMAL HIGH (ref 70–99)
Total Protein: 6 g/dL (ref 6.0–8.3)

## 2012-01-14 LAB — PHOSPHORUS: Phosphorus: 3.4 mg/dL (ref 2.3–4.6)

## 2012-01-14 LAB — MAGNESIUM: Magnesium: 1.9 mg/dL (ref 1.5–2.5)

## 2012-01-14 MED ORDER — CLINIMIX E/DEXTROSE (5/15) 5 % IV SOLN
INTRAVENOUS | Status: AC
  Administered 2012-01-14: 18:00:00 via INTRAVENOUS
  Filled 2012-01-14: qty 2000

## 2012-01-14 MED ORDER — SODIUM CHLORIDE 0.9 % IJ SOLN
10.0000 mL | INTRAMUSCULAR | Status: DC | PRN
  Administered 2012-01-14 – 2012-01-22 (×15): 10 mL
  Administered 2012-01-23: 20 mL
  Administered 2012-01-23: 30 mL
  Administered 2012-01-25 – 2012-02-08 (×6): 10 mL

## 2012-01-14 NOTE — Progress Notes (Signed)
[facility] Progress Note  01/14/2012  Pt transferred from PCCM Service Brief patient profile:  55 yowf adm by [facility] via [facility] ER on 09/07 with acute pancreatitis. H/O ERCP with sphincterotomy on 10/06/2011. On 9/12 RRT was called for respiratory decline and lethargy, subsequently required mechanical ventilation & Exploratory laparotomy 9/15 with debridement of necrotic pancreas and drainage.  Events Since Admission:  9/12 RRT and subsequent transfer to the ICU.  9/14 TFs held due to firm abdomen & sub glottic suctioning, WC higher  9/14 CT abd: increased ascites, Persistent edema/inflammation surrounding the head of the pancreas, with an approximate 1.8 x 2.6 cm pseudocyst at the junction of the head and body. Developing pseudocyst in the pancreatic head measuring approximately 1.3 x 2.6 cm, Large hiatal hernia.  9/14 doppler UE neg for DVT  9/15 1.5 L paracentesis - green brown fluid  9/15 Exploratory laparotomy with debridement of necrotic pancreas and drainage of the upper abdomen  9/18 EGD ([facility]anem) - 10-12 mm ulcer in the cardia of the stomach, protonix gtt, removed panda  9/20 CT abd: Decrease in moderate abdominal and pelvic ascites with intraperitoneal drainage catheters now seen in place. Mild increase in small bilateral pleural effusions. No significant change in diffuse body wall edema. Persistent small pseudocyst in the area the pancreatic head, which is decreased in size since earlier exam  9/23 extubated  9/24 dc fentanyl and start dilaudid.  9/24 change to SDU status.  Subjective: Pt reports no changes, no improvement in abd pain reported, sig drain output  Objective:  Vital signs in last 24 hours: Filed Vitals:   01/13/12 2345 01/14/12 0140 01/14/12 0455 01/14/12 1447  BP: 116/59 102/56 111/53 112/54  Pulse: 97 94 92 90  Temp: 100.5 F (38.1 C) 99.6 F (37.6 C) 99.1 F (37.3 C) 99.8 F (37.7 C)  TempSrc: Oral Oral Oral Oral  Resp: 18 16 18 18   Height:      Weight:       SpO2:  95% 95% 93%   Weight change:   Intake/Output Summary (Last 24 hours) at 01/14/12 1624 Last data filed at 01/14/12 1500  Gross per 24 hour  Intake 992.33 ml  Output   1555 ml  Net -562.67 ml   Lab Results  Component Value Date   HGBA1C 6.0* 11/27/2010   HGBA1C  Value: 5.5 (NOTE)                                                                       According to the ADA Clinical Practice Recommendations for 2011, when HbA1c is used as a screening test:   >=6.5%   Diagnostic of Diabetes Mellitus           (if abnormal result  is confirmed)  5.7-6.4%   Increased risk of developing Diabetes Mellitus  References:Diagnosis and Classification of Diabetes Mellitus,Diabetes Care,2011,34(Suppl 1):S62-S69 and Standards of Medical Care in         Diabetes - 2011,Diabetes Care,2011,34  (Suppl 1):S11-S61. 01/18/2010   HGBA1C  Value: 5.4 (NOTE)  According to the ADA Clinical Practice Recommendations for 2011, when HbA1c is used as a screening test:   >=6.5%   Diagnostic of Diabetes Mellitus           (if abnormal result  is confirmed)  5.7-6.4%   Increased risk of developing Diabetes Mellitus  References:Diagnosis and Classification of Diabetes Mellitus,Diabetes Care,2011,34(Suppl 1):S62-S69 and Standards of Medical Care in         Diabetes - 2011,Diabetes Care,2011,34  (Suppl 1):S11-S61. 09/15/2009   Lab Results  Component Value Date   LDLCALC 78 09/23/2011   CREATININE 0.50 01/14/2012    Review of Systems As above, otherwise all reviewed and reported negative  Physical Exam General: chronically ill appearance.  HEENT: WNL  Cardiovascular: RRR s M.  Pulmonary/Chest: clear anteriorly, decreased bs bases  Abdominal: Mildly distended but soft. tender diffusely. Hypoactive bowel sounds. Midline lap incision with 2 JP drains with green drainage  Musculoskeletal: 1-2+ edema and no tenderness.  Neurological: no focal deficits,  reports less pain  Lab Results: Results for orders placed during the hospital encounter of 12/26/11 (from the past 24 hour(s))  GLUCOSE, CAPILLARY     Status: Abnormal   Collection Time   01/13/12  7:58 PM      Component Value Range   Glucose-Capillary 152 (*) 70 - 99 mg/dL   Comment 1 Notify RN    GLUCOSE, CAPILLARY     Status: Abnormal   Collection Time   01/13/12 11:43 PM      Component Value Range   Glucose-Capillary 137 (*) 70 - 99 mg/dL  GLUCOSE, CAPILLARY     Status: Abnormal   Collection Time   01/14/12  3:59 AM      Component Value Range   Glucose-Capillary 121 (*) 70 - 99 mg/dL  COMPREHENSIVE METABOLIC PANEL     Status: Abnormal   Collection Time   01/14/12  5:00 AM      Component Value Range   Sodium 131 (*) 135 - 145 mEq/L   Potassium 3.8  3.5 - 5.1 mEq/L   Chloride 101  96 - 112 mEq/L   CO2 22  19 - 32 mEq/L   Glucose, Bld 114 (*) 70 - 99 mg/dL   BUN 12  6 - 23 mg/dL   Creatinine, Ser 0.27  0.50 - 1.10 mg/dL   Calcium 7.9 (*) 8.4 - 10.5 mg/dL   Total Protein 6.0  6.0 - 8.3 g/dL   Albumin 1.5 (*) 3.5 - 5.2 g/dL   AST 29  0 - 37 U/L   ALT 35  0 - 35 U/L   Alkaline Phosphatase 96  39 - 117 U/L   Total Bilirubin 0.2 (*) 0.3 - 1.2 mg/dL   GFR calc non Af Amer >90  >90 mL/min   GFR calc Af Amer >90  >90 mL/min  MAGNESIUM     Status: Normal   Collection Time   01/14/12  5:00 AM      Component Value Range   Magnesium 1.9  1.5 - 2.5 mg/dL  PHOSPHORUS     Status: Normal   Collection Time   01/14/12  5:00 AM      Component Value Range   Phosphorus 3.4  2.3 - 4.6 mg/dL    Micro Results: Recent Results (from the past 240 hour(s))  URINE CULTURE     Status: Normal   Collection Time   01/04/12  4:59 PM      Component Value Range Status Comment  Specimen Description URINE, CATHETERIZED   Final    Special Requests zosyn,flagyl   Final    Culture  Setup Time 01/05/2012 02:21   Final    Colony Count NO GROWTH   Final    Culture NO GROWTH   Final    Report Status  01/05/2012 FINAL   Final     Medications:  Scheduled Meds:   . antiseptic oral rinse  15 mL Mouth Rinse QID  . chlorhexidine  15 mL Mouth Rinse BID  . folic acid  1 mg Intravenous Daily  .  HYDROmorphone (DILAUDID) injection  0.5 mg Intravenous Once  . insulin aspart  2-6 Units Subcutaneous Q4H  . LORazepam  1 mg Intravenous Q8H  . metronidazole  500 mg Intravenous Q8H  . octreotide  100 mcg Subcutaneous Q8H  . pantoprazole (PROTONIX) IV  40 mg Intravenous Q12H  . piperacillin-tazobactam (ZOSYN)  IV  3.375 g Intravenous Q8H   Continuous Infusions:   . sodium chloride 20 mL/hr (01/13/12 0042)  . TPN (CLINIMIX) +/- additives 80 mL/hr at 01/13/12 1732   And  . fat emulsion 240 mL (01/13/12 1733)  . TPN (CLINIMIX) +/- additives 75 mL/hr at 01/12/12 1727  . TPN (CLINIMIX) +/- additives     PRN Meds:.acetaminophen, HYDROmorphone (DILAUDID) injection, menthol-cetylpyridinium, ondansetron (ZOFRAN) IV, polyvinyl alcohol, sodium chloride  Assessment/Plan:  Ruptured pancreatic pseudocyst with peritonitis,  -s/p Exploratory laparotomy with debridement of necrotic pancreas and drainage of the upper abdomen 01/03/12 Dr. Wenda Low.  -Post op drainage from pseudocyst  -Post op VDRF on Vent, extubated 01/11/12  Day 11 flagyl  Day 15 Zosyn - continue TNA per surgery - continue NPO - continue pain meds IV  Anemia  - currently stable, monitoring H/H= 7.3/22.3   C. Diff colitis - continue flagyl IV - enteral flagyl or oral vanc when taking p.o.   Septic Shock - resolved now  Chronic Narcotic Dependence - IV dilaudid for pain  ARDS / Acute Resp Failure - pt off vent since 9/23 -monitoring closely post extubation  Hx of pancreatitis, and alcohol abuse.   Hyponatremia - volume overload - monitoring closely - BMP in AM  GERD  - continue PPI every 12 hours  Elevated liver function tests   - LFTs have returned to normal  Hiatal Hernia  9/18 EGD ([facility]) - 10-12 mm ulcer in  the cardia of the stomach, protonix gtt, removed panda    LOS: 19 days   Manpreet Strey 01/14/2012, 4:24 PM   Cleora Fleet, MD, CDE, FAAFP [facility] [place], [place]  [phone]

## 2012-01-14 NOTE — Progress Notes (Signed)
8 Days Post-Op  Subjective: No real change.  Objective: Vital signs in last 24 hours: Temp:  [97.9 F (36.6 C)-100.5 F (38.1 C)] 99.1 F (37.3 C) (09/26 0455) Pulse Rate:  [92-103] 92  (09/26 0455) Resp:  [16-21] 18  (09/26 0455) BP: (102-122)/(53-69) 111/53 mmHg (09/26 0455) SpO2:  [94 %-100 %] 95 % (09/26 0455) Last BM Date: 01/13/12  205 recorded from drains, +BM recorded yesterday, TM 100.5, VSS, labs OK albumin is low, no cbc, will recheck tomorrow.  Intake/Output from previous day: 09/25 0701 - 09/26 0700 In: 1152.3 [I.V.:200; IV Piggyback:125; TPN:827.3] Out: 2156 [Urine:1950; Drains:205; Stool:1] Intake/Output this shift:    General appearance: alert, cooperative and no distress Resp: clear to auscultation bilaterally GI: Drains show same white colored drainage, wound vac in place.+BS, +BM yesterday. Mentation is slow.  She ask questions after you answer them.  Lab Results:   Basename 01/13/12 0329 01/12/12 0530  WBC 14.7* 12.8*  HGB 7.3* 7.5*  HCT 22.3* 23.0*  PLT 506* 465*    BMET  Basename 01/14/12 0500 01/13/12 0329  NA 131* 132*  K 3.8 3.6  CL 101 102  CO2 22 21  GLUCOSE 114* 124*  BUN 12 13  CREATININE 0.50 0.54  CALCIUM 7.9* 7.7*   PT/INR No results found for this basename: LABPROT:2,INR:2 in the last 72 hours   Lab 01/14/12 0500 01/11/12 0540 01/09/12 0510  AST 29 28 55*  ALT 35 83* 191*  ALKPHOS 96 77 95  BILITOT 0.2* 0.3 0.3  PROT 6.0 5.6* 5.4*  ALBUMIN 1.5* 1.5* 1.4*     Lipase     Component Value Date/Time   LIPASE 113* 01/11/2012 0540     Studies/Results: Dg Chest Port 1 View  01/13/2012  *RADIOLOGY REPORT*  Clinical Data: Atelectasis.  PORTABLE CHEST - 1 VIEW  Comparison: 01/12/2012.  Findings: Right IJ central line appears unchanged.  Low lung volumes are present with greater than left basilar atelectasis. Drain projects over the left chest and left upper quadrant. Cardiomediastinal contours are unchanged.  IMPRESSION: No  interval change.   Original Report Authenticated By: Andreas Newport, M.D.     Medications:    . antiseptic oral rinse  15 mL Mouth Rinse QID  . chlorhexidine  15 mL Mouth Rinse BID  . folic acid  1 mg Intravenous Daily  .  HYDROmorphone (DILAUDID) injection  0.5 mg Intravenous Once  . insulin aspart  2-6 Units Subcutaneous Q4H  . LORazepam  1 mg Intravenous Q8H  . metronidazole  500 mg Intravenous Q8H  . octreotide  100 mcg Subcutaneous Q8H  . pantoprazole (PROTONIX) IV  40 mg Intravenous Q12H  . piperacillin-tazobactam (ZOSYN)  IV  3.375 g Intravenous Q8H    Assessment/Plan Ruptured pseudocyst with peritonitis, s/p Exploratory laparotomy with debridement of necrotic pancreas and drainage of the upper abdomen 01/03/12 Dr. Wenda Low.  Post op drainage from pseudocyst Post op VDRF on Vent, extubated 01/11/12  Anemia H/H= 7.3/22.3 Hx of pancreatitis, and alcohol abuse.  GERD  Elevated liver function tests  GERD (gastroesophageal reflux disease)  Hiatal Hernia  9/18 EGD (Ganem) - 10-12 mm ulcer in the cardia of the stomach, protonix gtt, removed panda  Day 11 flagyl Day 15 Zosyn   Plan:  Continue current RX. Increase time OOB, PT seeing pt now. IS q1h, recheck CBC in AM    LOS: 19 days    Carrin Vannostrand 01/14/2012

## 2012-01-14 NOTE — Progress Notes (Signed)
Medical tx for pancreatitis. 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    Keep NPO for now due to copious pancreatic secretions in drains and wound.

## 2012-01-14 NOTE — Progress Notes (Signed)
PARENTERAL NUTRITION CONSULT NOTE - FOLLOW UP  Pharmacy Consult for TNA Indication: Severe Pancreatitis  Allergies  Allergen Reactions  . Ciprofloxacin Nausea Only    REACTION: nausea   Patient Measurements: Height: 5\' 6"  (167.6 cm) Weight: 166 lb 10.7 oz (75.6 kg) IBW/kg (Calculated) : 59.3   Vital Signs: Temp: 99.1 F (37.3 C) (09/26 0455) Temp src: Oral (09/26 0455) BP: 111/53 mmHg (09/26 0455) Pulse Rate: 92  (09/26 0455) Intake/Output from previous day: 09/25 0701 - 09/26 0700 In: 1152.3 [I.V.:200; IV Piggyback:125; TPN:827.3] Out: 2156 [Urine:1950; Drains:205; Stool:1] Intake/Output from this shift:    Labs:  [facility] 01/13/12 0329 01/12/12 0530  WBC 14.7* 12.8*  HGB 7.3* 7.5*  HCT 22.3* 23.0*  PLT 506* 465*  APTT -- --  INR -- --    Basename 01/14/12 0500 01/13/12 0329 01/12/12 0530  NA 131* 132* 132*  K 3.8 3.6 3.5  CL 101 102 102  CO2 22 21 22   GLUCOSE 114* 124* 138*  BUN 12 13 13   CREATININE 0.50 0.54 0.54  LABCREA -- -- --  CREAT24HRUR -- -- --  CALCIUM 7.9* 7.7* 7.5*  MG 1.9 1.7 --  PHOS 3.4 2.9 --  PROT 6.0 -- --  ALBUMIN 1.5* -- --  AST 29 -- --  ALT 35 -- --  ALKPHOS 96 -- --  BILITOT 0.2* -- --  BILIDIR -- -- --  IBILI -- -- --  PREALBUMIN -- -- --  TRIG -- -- --  CHOLHDL -- -- --  CHOL -- -- --   Estimated Creatinine Clearance: 81.6 ml/min (by C-G formula based on Cr of 0.5).    Basename 01/14/12 0359 01/13/12 2343 01/13/12 1958  GLUCAP 121* 137* 152*   Insulin Requirements in the past 24 hours:   CBGs <150, required 10 units of ICU HG protocol last 24  Nutritional Goals:   RD recs 9/16: 1762 kcal, 86-95 grams protein  Reestimated s/p extubation 9/23: 1475-1775 kcal, 94-110 grams protein  Clinimix 5/15 @ 17ml/hr + Lipid 20% MWF will provide 96 g protein/day, 1843 kCal on lipid days, 1363 Kcal on non-lipid days for an average of 1569 Kcal per day/week.  Current nutrition:   Diet: NPO except ice chips  IVF: NS @  KVO  Clinimix E 5/20 @ 75 ml/hr  Assessment:   [name] admitted 9/8 with epigastric abd pain, fever, chills and N/V. Pt with h/o recurrent pancreatitis and ETOH abuse, s/p ERCP with sphincterotomy on 10/06/2011. Dx with acute pancreatitis this admit, developed ileus.   Rapid response called 9/12 for respiratory decline and lethargy - pt transfer to ICU and CCM consulted. Intubated 9/12. 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   Clinical deterioration with rising WBC and findings suggesting GI perforation, underwent exploratory laparotomy with debridement of necrotic pancreas and drainage of infected, ruptured pseudocyst 9/15. TNA started 9/16.   Possible conversion to enteral feeds now that extubated  Renal: Scr wnl Hepatic fxn: AST/ALT previously elevated ?shock liver 2/2 pancreatitis. Now WNL Lytes: K+ remains wnl. Na+ low but unable to adjust amount in TNA Pre-Alb: improving, 2.3 (9/17), 12.2 (9/23) TG/Cholesterol: wnl 9/17, 9/23 CBGs: At goal <150  Plan:    Continue clinimix to E 5/15 at 80 ml/hr  PO advancement per CCS  TNA to contain IV fat emulsion, standard multivitamins and trace elements only on MWF only due to ongoing shortage   TNA labs Monday/Thursdays  Koriana Stepien, Loma Messing  PharmD Pager #: [phone] 8:59 AM 01/14/2012

## 2012-01-15 DIAGNOSIS — K859 Acute pancreatitis without necrosis or infection, unspecified: Secondary | ICD-10-CM

## 2012-01-15 DIAGNOSIS — R5381 Other malaise: Secondary | ICD-10-CM

## 2012-01-15 DIAGNOSIS — E871 Hypo-osmolality and hyponatremia: Secondary | ICD-10-CM

## 2012-01-15 LAB — COMPREHENSIVE METABOLIC PANEL
BUN: 12 mg/dL (ref 6–23)
Calcium: 7.8 mg/dL — ABNORMAL LOW (ref 8.4–10.5)
Creatinine, Ser: 0.52 mg/dL (ref 0.50–1.10)
GFR calc Af Amer: >90 mL/min (ref >90)
GFR calc non Af Amer: >90 mL/min (ref >90)
Glucose, Bld: 136 mg/dL — ABNORMAL HIGH (ref 70–99)
Sodium: 133 mEq/L — ABNORMAL LOW (ref 135–145)
Total Protein: 5.8 g/dL — ABNORMAL LOW (ref 6.0–8.3)

## 2012-01-15 LAB — CBC
HCT: 22 % — ABNORMAL LOW (ref 36.0–46.0)
Hemoglobin: 6.8 g/dL — CL (ref 12.0–15.0)
MCH: 27.1 pg (ref 26.0–34.0)
MCHC: 30.9 g/dL (ref 30.0–36.0)
MCV: 87.6 fL (ref 78.0–100.0)

## 2012-01-15 LAB — PREPARE RBC (CROSSMATCH)

## 2012-01-15 LAB — GLUCOSE, CAPILLARY
Glucose-Capillary: 124 mg/dL — ABNORMAL HIGH (ref 70–99)
Glucose-Capillary: 131 mg/dL — ABNORMAL HIGH (ref 70–99)
Glucose-Capillary: 134 mg/dL — ABNORMAL HIGH (ref 70–99)

## 2012-01-15 MED ORDER — FAT EMULSION 20 % IV EMUL
250.0000 mL | INTRAVENOUS | Status: AC
  Administered 2012-01-15: 250 mL via INTRAVENOUS
  Filled 2012-01-15: qty 250

## 2012-01-15 MED ORDER — ACETAMINOPHEN 10 MG/ML IV SOLN
1000.0000 mg | Freq: Four times a day (QID) | INTRAVENOUS | Status: AC
  Administered 2012-01-15 – 2012-01-16 (×4): 1000 mg via INTRAVENOUS
  Filled 2012-01-15 (×5): qty 100

## 2012-01-15 MED ORDER — ZINC TRACE METAL 1 MG/ML IV SOLN
INTRAVENOUS | Status: AC
  Administered 2012-01-15: 18:00:00 via INTRAVENOUS
  Filled 2012-01-15: qty 2000

## 2012-01-15 NOTE — Progress Notes (Addendum)
[facility] Progress Note  01/15/2012  Pt transferred from PCCM Service  Brief patient profile:  55 yowf adm by [facility] via [facility] on 09/07 with acute pancreatitis. H/O ERCP with sphincterotomy on 10/06/2011. On 9/12 RRT was called for respiratory decline and lethargy, subsequently required mechanical ventilation & Exploratory laparotomy 9/15 with debridement of necrotic pancreas and drainage.  Events Since Admission:  9/12 RRT and subsequent transfer to the ICU.  9/14 TFs held due to firm abdomen & sub glottic suctioning, WC higher  9/14 CT abd: increased ascites, Persistent edema/inflammation surrounding the head of the pancreas, with an approximate 1.8 x 2.6 cm pseudocyst at the junction of the head and body. Developing pseudocyst in the pancreatic head measuring approximately 1.3 x 2.6 cm, Large hiatal hernia.  9/14 doppler UE neg for DVT  9/15 1.5 L paracentesis - green brown fluid  9/15 Exploratory laparotomy with debridement of necrotic pancreas and drainage of the upper abdomen  9/18 EGD ([name]) - 10-12 mm ulcer in the cardia of the stomach, protonix gtt, removed panda  9/20 CT abd: Decrease in moderate abdominal and pelvic ascites with intraperitoneal drainage catheters now seen in place. Mild increase in small bilateral pleural effusions. No significant change in diffuse body wall edema. Persistent small pseudocyst in the area the pancreatic head, which is decreased in size since earlier exam  9/23 extubated  9/24 dc fentanyl and start dilaudid.  9/24 change to SDU status. 9/26 transferred to [facility]  Subjective: Pt was sitting up in a chair today.  She says she is passing small amount of flatus, she says no BM, feels very weak, PRBCs ordered  Objective:  Vital signs in last 24 hours: Filed Vitals:   01/14/12 0455 01/14/12 1447 01/14/12 2245 01/15/12 0522  BP: 111/53 112/54 135/81 121/55  Pulse: 92 90 106 90  Temp: 99.1 F (37.3 C) 99.8 F (37.7 C) 98.7 F (37.1 C) 98.6 F (37  C)  TempSrc: Oral Oral Oral Oral  Resp: 18 18 18 18   Height:      Weight:      SpO2: 95% 93% 93% 91%   Weight change:   Intake/Output Summary (Last 24 hours) at 01/15/12 1203 Last data filed at 01/15/12 0500  Gross per 24 hour  Intake   2700 ml  Output   3205 ml  Net   -505 ml   Lab Results  Component Value Date   HGBA1C 6.0* 11/27/2010   HGBA1C  Value: 5.5 (NOTE)                                                                       According to the ADA Clinical Practice Recommendations for 2011, when HbA1c is used as a screening test:   >=6.5%   Diagnostic of Diabetes Mellitus           (if abnormal result  is confirmed)  5.7-6.4%   Increased risk of developing Diabetes Mellitus  References:Diagnosis and Classification of Diabetes Mellitus,Diabetes Care,2011,34(Suppl 1):S62-S69 and Standards of Medical Care in         Diabetes - 2011,Diabetes Care,2011,34  (Suppl 1):S11-S61. 01/18/2010   HGBA1C  Value: 5.4 (NOTE)  According to the ADA Clinical Practice Recommendations for 2011, when HbA1c is used as a screening test:   >=6.5%   Diagnostic of Diabetes Mellitus           (if abnormal result  is confirmed)  5.7-6.4%   Increased risk of developing Diabetes Mellitus  References:Diagnosis and Classification of Diabetes Mellitus,Diabetes Care,2011,34(Suppl 1):S62-S69 and Standards of Medical Care in         Diabetes - 2011,Diabetes Care,2011,34  (Suppl 1):S11-S61. 09/15/2009   Lab Results  Component Value Date   LDLCALC 78 09/23/2011   CREATININE 0.52 01/15/2012    Review of Systems As above, otherwise all reviewed and reported negative  Physical Exam General: chronically ill appearance.  HEENT: WNL  Cardiovascular: RRR s M.  Pulmonary/Chest: clear anteriorly, shallow BS, decreased bs bases  Abdominal: Mildly distended, tender diffusely, Hypoactive bowel sounds. Midline lap incision with 2 JP drains with green drainage    Musculoskeletal: 1-2+ edema and no tenderness.  Neurological: no focal deficits, reports less pain  Lab Results: Results for orders placed during the hospital encounter of 12/26/11 (from the past 24 hour(s))  GLUCOSE, CAPILLARY     Status: Abnormal   Collection Time   01/14/12  4:28 PM      Component Value Range   Glucose-Capillary 140 (*) 70 - 99 mg/dL   Comment 1 Notify RN    GLUCOSE, CAPILLARY     Status: Abnormal   Collection Time   01/14/12  7:44 PM      Component Value Range   Glucose-Capillary 135 (*) 70 - 99 mg/dL   Comment 1 Notify RN    GLUCOSE, CAPILLARY     Status: Abnormal   Collection Time   01/14/12 11:55 PM      Component Value Range   Glucose-Capillary 131 (*) 70 - 99 mg/dL   Comment 1 Notify RN    CBC     Status: Abnormal   Collection Time   01/15/12  5:08 AM      Component Value Range   WBC 13.3 (*) 4.0 - 10.5 K/uL   RBC 2.51 (*) 3.87 - 5.11 MIL/uL   Hemoglobin 6.8 (*) 12.0 - 15.0 g/dL   HCT 16.1 (*) 09.6 - 04.5 %   MCV 87.6  78.0 - 100.0 fL   MCH 27.1  26.0 - 34.0 pg   MCHC 30.9  30.0 - 36.0 g/dL   RDW 40.9 (*) 81.1 - 91.4 %   Platelets 606 (*) 150 - 400 K/uL  COMPREHENSIVE METABOLIC PANEL     Status: Abnormal   Collection Time   01/15/12  5:08 AM      Component Value Range   Sodium 133 (*) 135 - 145 mEq/L   Potassium 3.8  3.5 - 5.1 mEq/L   Chloride 102  96 - 112 mEq/L   CO2 23  19 - 32 mEq/L   Glucose, Bld 136 (*) 70 - 99 mg/dL   BUN 12  6 - 23 mg/dL   Creatinine, Ser 7.82  0.50 - 1.10 mg/dL   Calcium 7.8 (*) 8.4 - 10.5 mg/dL   Total Protein 5.8 (*) 6.0 - 8.3 g/dL   Albumin 1.4 (*) 3.5 - 5.2 g/dL   AST 28  0 - 37 U/L   ALT 27  0 - 35 U/L   Alkaline Phosphatase 84  39 - 117 U/L   Total Bilirubin 0.3  0.3 - 1.2 mg/dL   GFR calc non Af Amer >90  >90  mL/min   GFR calc Af Amer >90  >90 mL/min  GLUCOSE, CAPILLARY     Status: Abnormal   Collection Time   01/15/12  5:12 AM      Component Value Range   Glucose-Capillary 128 (*) 70 - 99 mg/dL  TYPE  AND SCREEN     Status: Normal (Preliminary result)   Collection Time   01/15/12  6:45 AM      Component Value Range   ABO/RH(D) A POS     Antibody Screen NEG     Sample Expiration 01/18/2012     Unit Number [id number]     Blood Component Type RED CELLS,LR     Unit division 00     Status of Unit ALLOCATED     Transfusion Status OK TO TRANSFUSE     Crossmatch Result Compatible    PREPARE RBC (CROSSMATCH)     Status: Normal   Collection Time   01/15/12  7:00 AM      Component Value Range   Order Confirmation ORDER PROCESSED BY BLOOD BANK      Micro Results: No results found for this or any previous visit (from the past 240 hour(s)).  Medications:  Scheduled Meds:   . acetaminophen  1,000 mg Intravenous Q6H  . antiseptic oral rinse  15 mL Mouth Rinse QID  . chlorhexidine  15 mL Mouth Rinse BID  . folic acid  1 mg Intravenous Daily  . insulin aspart  2-6 Units Subcutaneous Q4H  . LORazepam  1 mg Intravenous Q8H  . metronidazole  500 mg Intravenous Q8H  . octreotide  100 mcg Subcutaneous Q8H  . pantoprazole (PROTONIX) IV  40 mg Intravenous Q12H  . piperacillin-tazobactam (ZOSYN)  IV  3.375 g Intravenous Q8H   Continuous Infusions:   . sodium chloride 20 mL/hr (01/13/12 0042)  . TPN (CLINIMIX) +/- additives 80 mL/hr at 01/13/12 1732   And  . fat emulsion 240 mL (01/13/12 1733)  . TPN (CLINIMIX) +/- additives     And  . fat emulsion    . TPN (CLINIMIX) +/- additives 80 mL/hr at 01/14/12 1751   PRN Meds:.acetaminophen, HYDROmorphone (DILAUDID) injection, menthol-cetylpyridinium, ondansetron (ZOFRAN) IV, polyvinyl alcohol, sodium chloride  Assessment/Plan:  Ruptured pancreatic pseudocyst with peritonitis   -s/p Exploratory laparotomy with debridement of necrotic pancreas and drainage of the upper abdomen 01/03/12 Dr. Wenda Low.   -Post op drainage from pseudocyst   -Post op VDRF on Vent, extubated 01/11/12   Day 12 flagyl   Day 16 Zosyn - continue TNA per surgery -  continue NPO - continue pain meds IV  Anemia  - ordered 1 unit PRBCs today  - following CBC  Gastric Ulcer - continue BID PPI treatment - had been holding lovenox secondary to anemia  Deconditioning/Weakness/Immobility - CIR consult appreciated, pt may be candidate for inpatient rehab - CCS team is increasing ambulation - SCDs for DVT prophylaxis  C. Diff colitis - continue flagyl IV - enteral flagyl or oral vanc when taking p.o.   Septic Shock - resolved now  Chronic Narcotic Dependence - IV dilaudid for pain  ARDS / Acute Resp Failure - pt off vent since 9/23 -monitoring closely post extubation  Hx of pancreatitis, and alcohol abuse.   Hyponatremia - volume overloaded - monitoring closely - BMP in AM  GERD   - continue PPI every 12 hours (pt has a gastric ulcer)   Elevated liver function tests   - LFTs have returned to normal  Hiatal  Hernia  9/18 EGD ([name]) - 10-12 mm ulcer in the cardia of the stomach, protonix gtt, removed panda     LOS: 20 days   Clanford Johnson 01/15/2012, 12:03 PM  Cleora Fleet, MD, CDE, FAAFP [name] [place], [place]  [phone]

## 2012-01-15 NOTE — Progress Notes (Signed)
Occupational Therapy Treatment Patient Details Name: [name] MRN: [id number] DOB: January 26, 1956 Today's Date: 01/15/2012 Time: 1040-1106 OT Time Calculation (min): 26 min  OT Assessment / Plan / Recommendation Comments on Treatment Session Pt progressing slowly. con't to recommend CIR.    Follow Up Recommendations  Skilled nursing facility;Inpatient Rehab    Barriers to Discharge       Equipment Recommendations  Rolling walker with 5" wheels;3 in 1 bedside comode    Recommendations for Other Services Rehab consult  Frequency Min 2X/week   Plan Discharge plan remains appropriate    Precautions / Restrictions Precautions Precautions: Fall Precaution Comments: abdominal surgery, drains L/R abdomen, wound vac Restrictions Weight Bearing Restrictions: No   Pertinent Vitals/Pain C/o 8/10 pain in abdomen. RN made aware and repositioned for comfort.    ADL  Grooming: Performed;Wash/dry face;Brushing hair;Minimal assistance;Teeth care Where Assessed - Grooming: Unsupported sitting Toilet Transfer: Simulated;+2 Total assistance Toileting - Clothing Manipulation and Hygiene: +2 Total assistance Toileting - Clothing Manipulation and Hygiene: Patient Percentage: 30% Where Assessed - Toileting Clothing Manipulation and Hygiene: Standing ADL Comments: Pt took several side steps with RW with +2 A before pivoting to chair. Pt required min A to comb back of hair from fatigue. While brushing teeth, pt drooled water and toothpaste all over self requiring propmpting to clean self up.    OT Diagnosis:    OT Problem List:   OT Treatment Interventions:     OT Goals ADL Goals ADL Goal: Grooming - Progress: Progressing toward goals ADL Goal: Toilet Transfer - Progress: Progressing toward goals ADL Goal: Toileting - Clothing Manipulation - Progress: Progressing toward goals ADL Goal: Toileting - Hygiene - Progress: Progressing toward goals ADL Goal: Additional Goal #1 - Progress:  Progressing toward goals  Visit Information  Last OT Received On: 01/15/12 Assistance Needed: +2 PT/OT Co-Evaluation/Treatment: Yes    Subjective Data  Subjective: My abdomen still hurts a lot.   Prior Functioning       Cognition  Overall Cognitive Status: No family/caregiver present to determine baseline cognitive functioning Area of Impairment: Awareness of errors Arousal/Alertness: Awake/alert Orientation Level: Appears intact for tasks assessed Behavior During Session: [facility] for tasks performed Awareness of Errors: Assistance required to identify errors made;Assistance required to correct errors made    Mobility  Shoulder Instructions Bed Mobility Bed Mobility: Rolling Right;Right Sidelying to Sit Rolling Right: 4: Min assist;With rail Right Sidelying to Sit: 1: +2 Total assist Right Sidelying to Sit: Patient Percentage: 40% Details for Bed Mobility Assistance: VCs safety, technique, hand placement. Assist for bil LEs off bed and trunk to upright. Utilized bed pad for scooting, positioning Transfers Sit to Stand: 1: +2 Total assist;From elevated surface;From bed Sit to Stand: Patient Percentage: 60% Stand to Sit: 1: +2 Total assist;To chair/3-in-1 Details for Transfer Assistance: VCS safety, technique, hand placement. Assist with rise, stabilize, control descent.        Exercises      Balance Balance Balance Assessed: Yes Static Sitting Balance Static Sitting - Balance Support: Feet supported Static Sitting - Level of Assistance: 5: Stand by assistance Static Sitting - Comment/# of Minutes: Sat EOB 7-8 minutes. Pt able to brush teech. Fatigues easily.    End of Session OT - End of Session Activity Tolerance: Patient limited by pain Patient left: in chair;with call bell/phone within reach Nurse Communication: Mobility status  GO     Yvonne Petite A OTR/L [phone] 01/15/2012, 1:29 PM

## 2012-01-15 NOTE — Plan of Care (Signed)
Problem: Phase III Progression Outcomes Goal: Pain controlled on oral analgesia Outcome: Not Progressing Still requiring IV dilaudid every 2 hours, MD aware, IV Tylenol started  Goal: Tolerating diet Outcome: Not Applicable Date Met:  01/15/12 Still NPO; on TPN

## 2012-01-15 NOTE — Progress Notes (Signed)
Physical Therapy Treatment Patient Details Name: [name] MRN: [id number] DOB: January 24, 1956 Today's Date: 01/15/2012 Time: 1040-1104 PT Time Calculation (min): 24 min  PT Assessment / Plan / Recommendation Comments on Treatment Session  Continue to recommend CIR    Follow Up Recommendations  Inpatient Rehab    Barriers to Discharge        Equipment Recommendations  Rolling walker with 5" wheels;3 in 1 bedside comode    Recommendations for Other Services OT consult  Frequency Min 3X/week   Plan Discharge plan remains appropriate    Precautions / Restrictions Precautions Precautions: Fall Precaution Comments: abdominal surgery, drains L/R abdomen, wound vac Restrictions Weight Bearing Restrictions: No   Pertinent Vitals/Pain     Mobility  Bed Mobility Bed Mobility: Rolling Right;Right Sidelying to Sit Rolling Right: 4: Min assist;With rail Right Sidelying to Sit: 1: +2 Total assist Right Sidelying to Sit: Patient Percentage: 40% Details for Bed Mobility Assistance: VCs safety, technique, hand placement. Assist for bil LEs off bed and trunk to upright. Utilized bed pad for scooting, positioning Transfers Transfers: Sit to Stand;Stand to Sit Sit to Stand: 1: +2 Total assist;From elevated surface;From bed Sit to Stand: Patient Percentage: 60% Stand to Sit: 1: +2 Total assist;To chair/3-in-1 Stand Pivot Transfers: 1: +2 Total assist Stand Pivot Transfers: Patient Percentage: 50% Details for Transfer Assistance: VCS safety, technique, hand placement. Assist with rise, stabilize, control descent.  Ambulation/Gait Ambulation/Gait Assistance: 1: +2 Total assist Ambulation/Gait: Patient Percentage: 50% Ambulation Distance (Feet): 3 Feet Assistive device: Rolling walker Ambulation/Gait Assistance Details: 3-5 side steps and pivotal steps to recliner with RW. VCs safety, technique. Assist to stabilize and maneuver with RW    Exercises General Exercises - Lower  Extremity Ankle Circles/Pumps: AROM;Both;10 reps;Seated Quad Sets: AROM;Both;10 reps;Seated Hip ABduction/ADduction: AAROM;Both;10 reps;Seated   PT Diagnosis:    PT Problem List:   PT Treatment Interventions:     PT Goals Acute Rehab PT Goals Pt will go Supine/Side to Sit: with min assist PT Goal: Supine/Side to Sit - Progress: Progressing toward goal Pt will go Sit to Stand: with min assist PT Goal: Sit to Stand - Progress: Progressing toward goal Pt will go Stand to Sit: with min assist PT Goal: Stand to Sit - Progress: Progressing toward goal Pt will Transfer Bed to Chair/Chair to Bed: with min assist PT Transfer Goal: Bed to Chair/Chair to Bed - Progress: Progressing toward goal Pt will Ambulate: 16 - 50 feet;with min assist;with rolling walker PT Goal: Ambulate - Progress: Progressing toward goal  Visit Information  Last PT Received On: 01/15/12 Assistance Needed: +1    Subjective Data  Subjective: "I feel weak in the middle" Patient Stated Goal: Home   Cognition  Arousal/Alertness: Awake/alert Behavior During Session: [facility] for tasks performed    Balance  Balance Balance Assessed: Yes Static Sitting Balance Static Sitting - Balance Support: Feet supported Static Sitting - Level of Assistance: 5: Stand by assistance Static Sitting - Comment/# of Minutes: Sat EOB 7-8 minutes. Pt able to brush teech. Fatigues easily.   End of Session PT - End of Session Activity Tolerance: Patient limited by fatigue;Patient limited by pain Patient left: in chair;with call bell/phone within reach   GP     Rebeca Alert [facility] 01/15/2012, 11:19 AM [phone]

## 2012-01-15 NOTE — Progress Notes (Signed)
PARENTERAL NUTRITION CONSULT NOTE - FOLLOW UP  Pharmacy Consult for TNA Indication: Severe Pancreatitis  Allergies  Allergen Reactions  . Ciprofloxacin Nausea Only    REACTION: nausea   Patient Measurements: Height: 5\' 6"  (167.6 cm) Weight: 166 lb 10.7 oz (75.6 kg) IBW/kg (Calculated) : 59.3   Vital Signs: Temp: 98.6 F (37 C) (09/27 0522) Temp src: Oral (09/27 0522) BP: 121/55 mmHg (09/27 0522) Pulse Rate: 90  (09/27 0522) Intake/Output from previous day: 09/26 0701 - 09/27 0700 In: 2850 [I.V.:420; IV Piggyback:450; TPN:1980] Out: 3205 [Urine:3120; Drains:85] Intake/Output from this shift:    Labs:  [facility] 01/15/12 0508 01/13/12 0329  WBC 13.3* 14.7*  HGB 6.8* 7.3*  HCT 22.0* 22.3*  PLT 606* 506*  APTT -- --  INR -- --    Basename 01/15/12 0508 01/14/12 0500 01/13/12 0329  NA 133* 131* 132*  K 3.8 3.8 3.6  CL 102 101 102  CO2 23 22 21   GLUCOSE 136* 114* 124*  BUN 12 12 13   CREATININE 0.52 0.50 0.54  LABCREA -- -- --  CREAT24HRUR -- -- --  CALCIUM 7.8* 7.9* 7.7*  MG -- 1.9 1.7  PHOS -- 3.4 2.9  PROT 5.8* 6.0 --  ALBUMIN 1.4* 1.5* --  AST 28 29 --  ALT 27 35 --  ALKPHOS 84 96 --  BILITOT 0.3 0.2* --  BILIDIR -- -- --  IBILI -- -- --  PREALBUMIN -- -- --  TRIG -- -- --  CHOLHDL -- -- --  CHOL -- -- --   Estimated Creatinine Clearance: 81.6 ml/min (by C-G formula based on Cr of 0.52).    Basename 01/15/12 0512 01/14/12 2355 01/14/12 1944  GLUCAP 128* 131* 135*   Insulin Requirements in the past 24 hours:   CBGs <150, required 12 units of ICU HG protocol last 24  Nutritional Goals:   RD recs 9/16: 1762 kcal, 86-95 grams protein  Reestimated s/p extubation 9/23: 1475-1775 kcal, 94-110 grams protein  Clinimix 5/15 @ 4ml/hr + Lipid 20% MWF will provide 96 g protein/day, 1843 kCal on lipid days, 1363 Kcal on non-lipid days for an average of 1569 Kcal per day/week.  Current nutrition:   Diet: NPO except ice chips  IVF: NS @  KVO  Clinimix E 5/20 @ 80 ml/hr  Assessment:   [name] admitted 9/8 with epigastric abd pain, fever, chills and N/V. Pt with h/o recurrent pancreatitis and ETOH abuse, s/p ERCP with sphincterotomy on 10/06/2011. Dx with acute pancreatitis this admit, developed ileus.   Rapid response called 9/12 for respiratory decline and lethargy - pt transfer to ICU and CCM consulted. Intubated 9/12. 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   Clinical deterioration with rising WBC and findings suggesting GI perforation, underwent exploratory laparotomy with debridement of necrotic pancreas and drainage of infected, ruptured pseudocyst 9/15. TNA started 9/16.   Possible conversion to enteral feeds now that extubated  Renal: Scr wnl, I/O 2850/3205, UOP=3.1L, Drainage = 85ml Hepatic fxn: AST/ALT previously elevated ?shock liver 2/2 pancreatitis. Now WNL Lytes: K+ remains wnl; Ca= 7.8, but corrects to 9.88 adj for alb; Na+ low but unable to adjust in TNA Pre-Alb: improving, 2.3 (9/17), 12.2 (9/23) TG/Cholesterol: wnl 9/17, 9/23 CBGs: At goal <150  Plan:    Continue clinimix to E 5/15 at 80 ml/hr  PO advancement per CCS  TNA to contain IV fat emulsion, standard multivitamins and trace elements only on MWF only  due to ongoing shortage   TNA labs Monday/Thursdays  Dannielle Huh PharmD Pager #: [phone] 7:07 AM 01/15/2012

## 2012-01-15 NOTE — Progress Notes (Signed)
CRITICAL VALUE ALERT  Critical value received: Hgb 6.8  Date of notification:  01/15/12  Time of notification:  0616  Critical value read back:yes  Nurse who received alert:  Lucillie Garfinkel  MD notified (1st page):    Time of first page:  0620  MD notified (2nd page):  Time of second page:  Responding MD:  Merdis Delay  Time MD responded: [phone]

## 2012-01-15 NOTE — Progress Notes (Signed)
9 Days Post-Op  Subjective: No real change, tears very easily, we talked a little about dilaudid use, she is afraid of "suffering," and not being able to tolerate pain. Also notes she's very weak. Could not bathe alone, needed help.  Objective: Vital signs in last 24 hours: Temp:  [98.6 F (37 C)-99.8 F (37.7 C)] 98.6 F (37 C) (09/27 0522) Pulse Rate:  [90-106] 90  (09/27 0522) Resp:  [18] 18  (09/27 0522) BP: (112-135)/(54-81) 121/55 mmHg (09/27 0522) SpO2:  [91 %-93 %] 91 % (09/27 0522) Last BM Date: 01/13/12  Much less drainage today, down to 85 Ml recorded. NPO, on TNA., labs stable, TM 99.6, VSS Taking dilaudid every 2 hours 11mg  yesterday. Intake/Output from previous day: 09/26 0701 - 09/27 0700 In: 2850 [I.V.:420; IV Piggyback:450; TPN:1980] Out: 3205 [Urine:3120; Drains:85] Intake/Output this shift:    General appearance: alert, cooperative, no distress and very anxious. GI: open abdominal wound examined,  it is clean with good granular tissue along the walls, the mid portion is closing.  the base at both ends of the wound are well drained, but white fibrous nonviable tissue, and loose facsial sutures noted at base both ends.    Lab Results:   [name] 01/15/12 0508 01/13/12 0329  WBC 13.3* 14.7*  HGB 6.8* 7.3*  HCT 22.0* 22.3*  PLT 606* 506*    BMET  Basename 01/15/12 0508 01/14/12 0500  NA 133* 131*  K 3.8 3.8  CL 102 101  CO2 23 22  GLUCOSE 136* 114*  BUN 12 12  CREATININE 0.52 0.50  CALCIUM 7.8* 7.9*   PT/INR No results found for this basename: LABPROT:2,INR:2 in the last 72 hours   Lab 01/15/12 0508 01/14/12 0500 01/11/12 0540 01/09/12 0510  AST 28 29 28  55*  ALT 27 35 83* 191*  ALKPHOS 84 96 77 95  BILITOT 0.3 0.2* 0.3 0.3  PROT 5.8* 6.0 5.6* 5.4*  ALBUMIN 1.4* 1.5* 1.5* 1.4*     Lipase     Component Value Date/Time   LIPASE 113* 01/11/2012 0540     Studies/Results: No results found.  Medications:    . antiseptic oral rinse  15  mL Mouth Rinse QID  . chlorhexidine  15 mL Mouth Rinse BID  . folic acid  1 mg Intravenous Daily  . insulin aspart  2-6 Units Subcutaneous Q4H  . LORazepam  1 mg Intravenous Q8H  . metronidazole  500 mg Intravenous Q8H  . octreotide  100 mcg Subcutaneous Q8H  . pantoprazole (PROTONIX) IV  40 mg Intravenous Q12H  . piperacillin-tazobactam (ZOSYN)  IV  3.375 g Intravenous Q8H    Assessment/Plan Ruptured pseudocyst with peritonitis, s/p Exploratory laparotomy with debridement of necrotic pancreas and drainage of the upper abdomen 01/03/12 Dr. Wenda Low.  Post op drainage from pseudocyst  Post op VDRF on Vent, extubated 01/11/12  Anemia H/H= 7.3/22.3  Hx of pancreatitis, and alcohol abuse.  GERD  Elevated liver function tests  GERD (gastroesophageal reflux disease)  Hiatal Hernia  9/18 EGD (Ganem) - 10-12 mm ulcer in the cardia of the stomach, protonix gtt, removed panda  Day 12 flagyl  Day 16 Zosyn   Plan:  We will replace the vac on the wound, it seems to be well drained.  Her H/H is slowly declining, she is on MVI, but no iron supplements.  She is not on chemical DVT prophylaxis. With her hct, i will defer to medicine on this.She is obviously very inactive.  We have OT  and PT consults and they are following, up in chair orders, will add ambulation orders.  LOS: 20 days    [name] 01/15/2012

## 2012-01-15 NOTE — Progress Notes (Signed)
Slow improvement.  On TNA.  Drainage decreasing.  Still with a fair amount of pain.

## 2012-01-15 NOTE — Consult Note (Signed)
Physical Medicine and Rehabilitation Consult Reason for Consult: Deconditioning/ pancreatitis Referring Physician: [facility]   HPI: [name] is a 56 y.o. female admitted 12/27/2011 with history of alcohol abuse, chronic diarrhea and recurrent pancreatitis with MRCP done 10/06/2011 he showed dilated common bile duct with biliary sludge as well as ERCP with sphincterotomy in June of 2013. Presented 12/27/2011 with epigastric abdominal pain with nausea vomiting. Patient reported poor appetite. She developed chills and  fever of 103. Noted lipase level 177. Acute abdomen series showed nonobstructing bowel gas pattern . On 12/31/2011 patient with respiratory decline and lethargy with critical care medicine consulted and patient transferred to ICU for close monitoring. Urinalysis positive for leukocytes and nitrites culture likely contaminated as well as chest x-ray showing probable pneumonia and patient placed on broad-spectrum antibiotics. Close monitoring for alcohol withdrawal with Ativan as indicated . Patient developed abdominal distention with ascites as well as elevated white count suspect sepsis. CT abdomen pelvis showing probable acute pancreatitis involving the head of the pancreas. A nasogastric tube was placed for nutritional support. Patient underwent exploratory laparotomy with debridement of necrotic pancreas and drainage of the upper abdomen with findings of ruptured pseudocyst with peritonitis 01/03/2012 per Dr. Luretha Murphy. Patient remained intubated for a time and extubated 01/11/2012. Developed melena with gastroenterology services again followup Dr. Evette Cristal. Patient underwent endoscopy 01/06/2012 with findings of 10-12 mm ulcer in the cardia of the stomach. At that time it was advised to remove nasogastric tube and begin TNA for nutrition. Patient remains on Flagyl for C. difficile colitis. Patient with anemia 6.8 on 01/15/2012 and plan transfuse today. Physical therapy evaluation  completed 01/11/2012 and occupational therapy on 01/12/2012. Physical therapy has recommended physical medicine rehabilitation consult to consider inpatient rehabilitation services   Review of Systems  Constitutional: Positive for fever and chills.  Gastrointestinal: Positive for heartburn, nausea, vomiting, abdominal pain and diarrhea.  Neurological: Positive for weakness.  All other systems reviewed and are negative.   Past Medical History  Diagnosis Date  . GERD (gastroesophageal reflux disease)   . Rosacea   . Elevated liver function tests   . Wears glasses   . Chronic diarrhea   . Alcohol abuse     H/o withdrawal   . PONV (postoperative nausea and vomiting)   . Pancreatitis    Past Surgical History  Procedure Date  . Cholecystectomy   . Shoulder surgery [id number]  . Diagnostic mammogram 2008  . Tubal ligation   . Colonoscopy Never  . Esophagogastroduodenoscopy 03/21/2011    Procedure: ESOPHAGOGASTRODUODENOSCOPY (EGD);  Surgeon: Yancey Flemings, MD;  Location: [facility] ENDOSCOPY;  Service: Endoscopy;  Laterality: N/A;  Patient may need to be done at bedside tomorrow depending on status  . Ercp 10/06/2011    Procedure: ENDOSCOPIC RETROGRADE CHOLANGIOPANCREATOGRAPHY (ERCP);  Surgeon: Theda Belfast, MD;  Location: [facility] ENDOSCOPY;  Service: Endoscopy;  Laterality: N/A;  . Laparotomy 01/03/2012    Procedure: EXPLORATORY LAPAROTOMY;  Surgeon: Valarie Merino, MD;  Location: [facility] ORS;  Service: General;  Laterality: N/A;  debridement of necrotic pancreas and placement of drains x2  . Esophagogastroduodenoscopy 01/06/2012    Procedure: ESOPHAGOGASTRODUODENOSCOPY (EGD);  Surgeon: Graylin Shiver, MD;  Location: [facility] ENDOSCOPY;  Service: Endoscopy;  Laterality: N/A;  bedside   Family History  Problem Relation Age of Onset  . Stroke Mother   . Heart disease Father   . Heart failure Father   . Heart disease Sister    Social History:  reports that she quit smoking about  33 years ago. She has never used  smokeless tobacco. She reports that she drinks about 1.5 - 2 ounces of alcohol per week. She reports that she does not use illicit drugs. Allergies:  Allergies  Allergen Reactions  . Ciprofloxacin Nausea Only    REACTION: nausea   Medications Prior to Admission  Medication Sig Dispense Refill  . LORazepam (ATIVAN) 1 MG tablet Take 1 mg by mouth 2 (two) times daily as needed. Anxiety      . metoCLOPramide (REGLAN) 10 MG tablet Take 10 mg by mouth daily as needed. For nausea      . oxyCODONE (OXY IR/ROXICODONE) 5 MG immediate release tablet Take 5 mg by mouth every 4 (four) hours as needed.        Home: Home Living Lives With: Alone Type of Home: House Home Access: Stairs to enter Secretary/administrator of Steps: 3 Home Layout: One level Bathroom Shower/Tub: Engineer, manufacturing systems: Standard Home Adaptive Equipment: None Additional Comments: Pt has son who will likely not be able to physically assist.  Functional History: Prior Function Able to Take Stairs?: Yes Functional Status:  Mobility: Bed Mobility Bed Mobility: Rolling Right Rolling Right: 3: Mod assist;With rail Rolling Right: Patient Percentage: 30% Rolling Left: 1: +2 Total assist Rolling Left: Patient Percentage: 30% Supine to Sit: 2: Max assist Supine to Sit: Patient Percentage: 30% Sit to Supine: 1: +2 Total assist Sit to Supine: Patient Percentage: 10% Transfers Transfers: Sit to Stand;Stand to Sit Sit to Stand: 1: +2 Total assist;From bed Sit to Stand: Patient Percentage: 50% Stand to Sit: To chair/3-in-1 Stand Pivot Transfers: 1: +2 Total assist Stand Pivot Transfers: Patient Percentage: 50% Ambulation/Gait Ambulation/Gait Assistance: Not tested (comment)    ADL: ADL Grooming: Performed;Wash/dry face;Minimal assistance Where Assessed - Grooming: Unsupported sitting Upper Body Bathing: Simulated;Maximal assistance Where Assessed - Upper Body Bathing: Unsupported sitting Lower Body Bathing:  Simulated;+2 Total assistance Where Assessed - Lower Body Bathing: Supported sit to stand Upper Body Dressing: Simulated;Maximal assistance Where Assessed - Upper Body Dressing: Unsupported sitting Lower Body Dressing: Simulated;+2 Total assistance Where Assessed - Lower Body Dressing: Supported sit to stand Toilet Transfer: Simulated;+2 Total assistance Toilet Transfer Method: Surveyor, minerals: Other (comment) (to recliner) ADL Comments: Attempted to stand with RW but pt stated it was too painful. Pivoted to the chair with +2 A. Pt extremely weak and deconditioned.  Cognition: Cognition Arousal/Alertness: Awake/alert Orientation Level: Oriented X4 Cognition Overall Cognitive Status: Difficult to assess Area of Impairment: Memory;Problem solving Arousal/Alertness: Awake/alert Orientation Level: Place;Situation;Time Behavior During Session: Anxious Cognition - Other Comments: Pt appears more oriented today.  Blood pressure 121/55, pulse 90, temperature 98.6 F (37 C), temperature source Oral, resp. rate 18, height 5\' 6"  (1.676 m), weight 75.6 kg (166 lb 10.7 oz), last menstrual period 12/08/2006, SpO2 91.00%. Physical Exam  Vitals reviewed. Constitutional:       frail female in no acute distress  HENT:  Head: Normocephalic.  Eyes:       Pupils reactive to light  Neck: Neck supple. No thyromegaly present.  Cardiovascular: Normal rate and regular rhythm.   Pulmonary/Chest: Breath sounds normal. She has no wheezes.  Abdominal: Bowel sounds are normal. There is no tenderness.  Musculoskeletal: She exhibits no edema.  Neurological: She is alert.  Reflex Scores:      Tricep reflexes are 1+ on the right side and 1+ on the left side.      Bicep reflexes are 1+ on the right side and  1+ on the left side.      Brachioradialis reflexes are 1+ on the right side and 1+ on the left side.      Patellar reflexes are 1+ on the right side and 1+ on the left side.       Achilles reflexes are 1+ on the right side and 1+ on the left side.      Patient is oriented to age date of birth in place. She is some decrease in recall of events and is slow to respond to some questions but for the most part was cognitively appropriate. She follows three-step commands. She has generalized weakness. 3 to 3+ proximally to 4/5 distally. Denies sensory loss in legs  Skin:       Abdominal wound clean with pink granulation tissue. Other wounds healing.   Psychiatric:       Noted flat affect but cooperative during exam    Results for orders placed during the hospital encounter of 12/26/11 (from the past 24 hour(s))  GLUCOSE, CAPILLARY     Status: Abnormal   Collection Time   01/14/12  8:21 AM      Component Value Range   Glucose-Capillary 144 (*) 70 - 99 mg/dL  GLUCOSE, CAPILLARY     Status: Abnormal   Collection Time   01/14/12 12:01 PM      Component Value Range   Glucose-Capillary 141 (*) 70 - 99 mg/dL  GLUCOSE, CAPILLARY     Status: Abnormal   Collection Time   01/14/12  4:28 PM      Component Value Range   Glucose-Capillary 140 (*) 70 - 99 mg/dL   Comment 1 Notify RN    GLUCOSE, CAPILLARY     Status: Abnormal   Collection Time   01/14/12  7:44 PM      Component Value Range   Glucose-Capillary 135 (*) 70 - 99 mg/dL   Comment 1 Notify RN    GLUCOSE, CAPILLARY     Status: Abnormal   Collection Time   01/14/12 11:55 PM      Component Value Range   Glucose-Capillary 131 (*) 70 - 99 mg/dL   Comment 1 Notify RN    CBC     Status: Abnormal (Preliminary result)   Collection Time   01/15/12  5:08 AM      Component Value Range   WBC 13.3 (*) 4.0 - 10.5 K/uL   RBC 2.51 (*) 3.87 - 5.11 MIL/uL   Hemoglobin PENDING  12.0 - 15.0 g/dL   HCT 16.1 (*) 09.6 - 04.5 %   MCV 87.6  78.0 - 100.0 fL   MCH 27.1  26.0 - 34.0 pg   MCHC 30.9  30.0 - 36.0 g/dL   RDW 40.9 (*) 81.1 - 91.4 %   Platelets 606 (*) 150 - 400 K/uL  GLUCOSE, CAPILLARY     Status: Abnormal   Collection Time    01/15/12  5:12 AM      Component Value Range   Glucose-Capillary 128 (*) 70 - 99 mg/dL   No results found.  Assessment/Plan: Diagnosis: severe deconditioning related to pancreatitis and multiple medical, s/p ex lap 1. Does the need for close, 24 hr/day medical supervision in concert with the patient's rehab needs make it unreasonable for this patient to be served in a less intensive setting? Yes and Potentially 2. Co-Morbidities requiring supervision/potential complications: ETOH abuse, wound, ARDS, C diff 3. Due to bladder management, bowel management, safety, skin/wound care, disease management, medication administration,  pain management and patient education, does the patient require 24 hr/day rehab nursing? Yes 4. Does the patient require coordinated care of a physician, rehab nurse, PT (1-2 hrs/day, 5 days/week) and OT (1-2 hrs/day, 5 days/week) to address physical and functional deficits in the context of the above medical diagnosis(es)? Yes Addressing deficits in the following areas: balance, endurance, locomotion, strength, transferring, bowel/bladder control, bathing, dressing, feeding, grooming, toileting and psychosocial support 5. Can the patient actively participate in an intensive therapy program of at least 3 hrs of therapy per day at least 5 days per week? Yes 6. The potential for patient to make measurable gains while on inpatient rehab is good 7. Anticipated functional outcomes upon discharge from inpatient rehab are mod I to min assist with PT, mod I to min assist with OT, n/a with SLP. 8. Estimated rehab length of stay to reach the above functional goals is: 2-3 weeks 9. Does the patient have adequate social supports to accommodate these discharge functional goals? Potentially 10. Anticipated D/C setting: Home 11. Anticipated post D/C treatments: HH therapy 12. Overall Rehab/Functional Prognosis: good  RECOMMENDATIONS: This patient's condition is appropriate for continued  rehabilitative care in the following setting: CIR Patient has agreed to participate in recommended program. Yes Note that insurance prior authorization may be required for reimbursement for recommended care.  Comment:Rehab RN to follow up.    Ivory Broad, MD     01/15/2012

## 2012-01-16 LAB — GLUCOSE, CAPILLARY
Glucose-Capillary: 117 mg/dL — ABNORMAL HIGH (ref 70–99)
Glucose-Capillary: 127 mg/dL — ABNORMAL HIGH (ref 70–99)
Glucose-Capillary: 104 mg/dL — ABNORMAL HIGH (ref 70–99)
Glucose-Capillary: 163 mg/dL — ABNORMAL HIGH (ref 70–99)
Glucose-Capillary: 123 mg/dL — ABNORMAL HIGH (ref 70–99)

## 2012-01-16 LAB — BASIC METABOLIC PANEL
CO2: 23 mEq/L (ref 19–32)
Calcium: 7.9 mg/dL — ABNORMAL LOW (ref 8.4–10.5)
Chloride: 103 mEq/L (ref 96–112)
Creatinine, Ser: 0.47 mg/dL — ABNORMAL LOW (ref 0.50–1.10)
Glucose, Bld: 131 mg/dL — ABNORMAL HIGH (ref 70–99)

## 2012-01-16 LAB — TYPE AND SCREEN
ABO/RH(D): A POS
Antibody Screen: NEGATIVE
Unit division: 0

## 2012-01-16 LAB — CBC
Hemoglobin: 7.9 g/dL — ABNORMAL LOW (ref 12.0–15.0)
MCHC: 33.3 g/dL (ref 30.0–36.0)
RDW: 16.8 % — ABNORMAL HIGH (ref 11.5–15.5)
WBC: 14.2 10*3/uL — ABNORMAL HIGH (ref 4.0–10.5)

## 2012-01-16 MED ORDER — CLINIMIX E/DEXTROSE (5/15) 5 % IV SOLN
INTRAVENOUS | Status: AC
  Administered 2012-01-16: 17:00:00 via INTRAVENOUS
  Filled 2012-01-16: qty 2000

## 2012-01-16 NOTE — Progress Notes (Signed)
10 Days Post-Op  Subjective: Seems to be doing a little better with pain control.  Complains that pain is worse when she gets up and moving. Also notes she's very weak.   Objective: Vital signs in last 24 hours: Temp:  [98 F (36.7 C)-99 F (37.2 C)] 99 F (37.2 C) (09/28 0556) Pulse Rate:  [85-91] 87  (09/28 0556) Resp:  [18-20] 18  (09/28 0556) BP: (105-122)/(53-69) 112/69 mmHg (09/28 0556) SpO2:  [93 %-98 %] 93 % (09/28 0556) FiO2 (%):  [2 %] 2 % (09/28 0556) Last BM Date: 01/13/12  NPO, on TNA., wbc trending up, may need repeat CT if continues to go up Taking dilaudid every 2 hours 11mg  yesterday. Intake/Output from previous day: 09/27 0701 - 09/28 0700 In: -  Out: 2950 [Urine:2950] Intake/Output this shift: Total I/O In: -  Out: 45 [Drains:45]  General appearance: alert, cooperative, no distress and very anxious. GI: abd soft, wound vac in place JP's with purulent output  Lab Results:   [facility] 01/16/12 0559 01/15/12 0508  WBC 14.2* 13.3*  HGB 7.9* 6.8*  HCT 23.7* 22.0*  PLT 645* 606*    BMET  Basename 01/16/12 0559 01/15/12 0508  NA 134* 133*  K 3.6 3.8  CL 103 102  CO2 23 23  GLUCOSE 131* 136*  BUN 12 12  CREATININE 0.47* 0.52  CALCIUM 7.9* 7.8*   PT/INR No results found for this basename: LABPROT:2,INR:2 in the last 72 hours   Lab 01/15/12 0508 01/14/12 0500 01/11/12 0540  AST 28 29 28   ALT 27 35 83*  ALKPHOS 84 96 77  BILITOT 0.3 0.2* 0.3  PROT 5.8* 6.0 5.6*  ALBUMIN 1.4* 1.5* 1.5*     Lipase     Component Value Date/Time   LIPASE 113* 01/11/2012 0540     Studies/Results: No results found.  Medications:    . acetaminophen  1,000 mg Intravenous Q6H  . antiseptic oral rinse  15 mL Mouth Rinse QID  . chlorhexidine  15 mL Mouth Rinse BID  . folic acid  1 mg Intravenous Daily  . insulin aspart  2-6 Units Subcutaneous Q4H  . LORazepam  1 mg Intravenous Q8H  . metronidazole  500 mg Intravenous Q8H  . octreotide  100 mcg  Subcutaneous Q8H  . pantoprazole (PROTONIX) IV  40 mg Intravenous Q12H  . piperacillin-tazobactam (ZOSYN)  IV  3.375 g Intravenous Q8H    Assessment/Plan Ruptured pseudocyst with peritonitis, s/p Exploratory laparotomy with debridement of necrotic pancreas and drainage of the upper abdomen 01/03/12 Dr. Wenda Low.  Post op drainage from pseudocyst  Post op VDRF on Vent, extubated 01/11/12  Anemia H/H= 7.3/22.3  Hx of pancreatitis, and alcohol abuse.  GERD  Elevated liver function tests  Hiatal Hernia  9/18 EGD ([facility]) - 10-12 mm ulcer in the cardia of the stomach, protonix gtt, removed panda  Day 13 flagyl  Day 17 Zosyn   Plan: Cont OT and PT consults, Cont current pain regimen  LOS: 21 days    [name] C. 01/16/2012

## 2012-01-16 NOTE — Progress Notes (Signed)
[facility] Progress Note  01/16/2012  Pt transferred from [facility] Service  Brief patient profile:  55 yowf adm by [facility] via [facility] ER on 09/07 with acute pancreatitis. H/O ERCP with sphincterotomy on 10/06/2011. On 9/12 RRT was called for respiratory decline and lethargy, subsequently required mechanical ventilation & Exploratory laparotomy 9/15 with debridement of necrotic pancreas and drainage.  Events Since Admission:  9/12 RRT and subsequent transfer to the ICU.  9/14 TFs held due to firm abdomen & sub glottic suctioning, WC higher  9/14 CT abd: increased ascites, Persistent edema/inflammation surrounding the head of the pancreas, with an approximate 1.8 x 2.6 cm pseudocyst at the junction of the head and body. Developing pseudocyst in the pancreatic head measuring approximately 1.3 x 2.6 cm, Large hiatal hernia.  9/14 doppler UE neg for DVT  9/15 1.5 L paracentesis - green brown fluid  9/15 Exploratory laparotomy with debridement of necrotic pancreas and drainage of the upper abdomen  9/18 EGD ([facility]) - 10-12 mm ulcer in the cardia of the stomach, protonix gtt, removed panda  9/20 CT abd: Decrease in moderate abdominal and pelvic ascites with intraperitoneal drainage catheters now seen in place. Mild increase in small bilateral pleural effusions. No significant change in diffuse body wall edema. Persistent small pseudocyst in the area the pancreatic head, which is decreased in size since earlier exam  9/23 extubated  9/24 dc fentanyl and start dilaudid.  9/24 change to SDU status.  9/26 [facility]   Subjective: Pt is ambulating more and says she feels like her appetite is coming back.  No BM but some flatus.    Objective:  Vital signs in last 24 hours: Filed Vitals:   01/15/12 1534 01/15/12 1703 01/15/12 2045 01/16/12 0556  BP: 106/55 122/63 112/56 112/69  Pulse: 88 91 85 87  Temp: 98.3 F (36.8 C) 98.7 F (37.1 C) 98.7 F (37.1 C) 99 F (37.2 C)  TempSrc: Oral Oral Oral  Oral  Resp: 18 18 18 18   Height:      Weight:      SpO2: 98% 93% 93% 93%   Weight change:   Intake/Output Summary (Last 24 hours) at 01/16/12 0857 Last data filed at 01/16/12 0559  Gross per 24 hour  Intake      0 ml  Output   2950 ml  Net  -2950 ml   Lab Results  Component Value Date   HGBA1C 6.0* 11/27/2010   HGBA1C  Value: 5.5 (NOTE)                                                                       According to the ADA Clinical Practice Recommendations for 2011, when HbA1c is used as a screening test:   >=6.5%   Diagnostic of Diabetes Mellitus           (if abnormal result  is confirmed)  5.7-6.4%   Increased risk of developing Diabetes Mellitus  References:Diagnosis and Classification of Diabetes Mellitus,Diabetes Care,2011,34(Suppl 1):S62-S69 and Standards of Medical Care in         Diabetes - 2011,Diabetes Care,2011,34  (Suppl 1):S11-S61. 01/18/2010   HGBA1C  Value: 5.4 (NOTE)  According to the ADA Clinical Practice Recommendations for 2011, when HbA1c is used as a screening test:   >=6.5%   Diagnostic of Diabetes Mellitus           (if abnormal result  is confirmed)  5.7-6.4%   Increased risk of developing Diabetes Mellitus  References:Diagnosis and Classification of Diabetes Mellitus,Diabetes Care,2011,34(Suppl 1):S62-S69 and Standards of Medical Care in         Diabetes - 2011,Diabetes Care,2011,34  (Suppl 1):S11-S61. 09/15/2009   Lab Results  Component Value Date   LDLCALC 78 09/23/2011   CREATININE 0.47* 01/16/2012    Review of Systems As above, otherwise all reviewed and reported negative  Physical Exam General: awake, alert, no distress, cooperative  HEENT: WNL  Cardiovascular: RRR s M.  Pulmonary/Chest: clear anteriorly, shallow BS, decreased bs bases  Abdominal: Mildly distended, tender diffusely, active bowel sounds heard,  Midline lap incision with 2 JP drains with drainage  Musculoskeletal: 1-2+  edema and no tenderness.  Neurological: no focal deficits, reports less pain  Lab Results: Results for orders placed during the hospital encounter of 12/26/11 (from the past 24 hour(s))  GLUCOSE, CAPILLARY     Status: Abnormal   Collection Time   01/15/12 12:18 PM      Component Value Range   Glucose-Capillary 107 (*) 70 - 99 mg/dL  GLUCOSE, CAPILLARY     Status: Abnormal   Collection Time   01/15/12  5:05 PM      Component Value Range   Glucose-Capillary 134 (*) 70 - 99 mg/dL   Comment 1 Notify RN    GLUCOSE, CAPILLARY     Status: Abnormal   Collection Time   01/15/12  7:47 PM      Component Value Range   Glucose-Capillary 124 (*) 70 - 99 mg/dL  GLUCOSE, CAPILLARY     Status: Abnormal   Collection Time   01/15/12  8:43 PM      Component Value Range   Glucose-Capillary 113 (*) 70 - 99 mg/dL  GLUCOSE, CAPILLARY     Status: Abnormal   Collection Time   01/16/12 12:48 AM      Component Value Range   Glucose-Capillary 126 (*) 70 - 99 mg/dL  GLUCOSE, CAPILLARY     Status: Abnormal   Collection Time   01/16/12  5:54 AM      Component Value Range   Glucose-Capillary 117 (*) 70 - 99 mg/dL  BASIC METABOLIC PANEL     Status: Abnormal   Collection Time   01/16/12  5:59 AM      Component Value Range   Sodium 134 (*) 135 - 145 mEq/L   Potassium 3.6  3.5 - 5.1 mEq/L   Chloride 103  96 - 112 mEq/L   CO2 23  19 - 32 mEq/L   Glucose, Bld 131 (*) 70 - 99 mg/dL   BUN 12  6 - 23 mg/dL   Creatinine, Ser 7.25 (*) 0.50 - 1.10 mg/dL   Calcium 7.9 (*) 8.4 - 10.5 mg/dL   GFR calc non Af Amer >90  >90 mL/min   GFR calc Af Amer >90  >90 mL/min  CBC     Status: Abnormal   Collection Time   01/16/12  5:59 AM      Component Value Range   WBC 14.2 (*) 4.0 - 10.5 K/uL   RBC 2.63 (*) 3.87 - 5.11 MIL/uL   Hemoglobin 7.9 (*) 12.0 - 15.0 g/dL   HCT 36.6 (*) 44.0 -  46.0 %   MCV 90.1  78.0 - 100.0 fL   MCH 30.0  26.0 - 34.0 pg   MCHC 33.3  30.0 - 36.0 g/dL   RDW 16.1 (*) 09.6 - 04.5 %   Platelets 645  (*) 150 - 400 K/uL  GLUCOSE, CAPILLARY     Status: Abnormal   Collection Time   01/16/12  8:01 AM      Component Value Range   Glucose-Capillary 127 (*) 70 - 99 mg/dL    Micro Results: No results found for this or any previous visit (from the past 240 hour(s)).  Medications:  Scheduled Meds:   . acetaminophen  1,000 mg Intravenous Q6H  . antiseptic oral rinse  15 mL Mouth Rinse QID  . chlorhexidine  15 mL Mouth Rinse BID  . folic acid  1 mg Intravenous Daily  . insulin aspart  2-6 Units Subcutaneous Q4H  . LORazepam  1 mg Intravenous Q8H  . metronidazole  500 mg Intravenous Q8H  . octreotide  100 mcg Subcutaneous Q8H  . pantoprazole (PROTONIX) IV  40 mg Intravenous Q12H  . piperacillin-tazobactam (ZOSYN)  IV  3.375 g Intravenous Q8H   Continuous Infusions:   . sodium chloride 20 mL/hr (01/13/12 0042)  . TPN (CLINIMIX) +/- additives 80 mL/hr at 01/15/12 1746   And  . fat emulsion 250 mL (01/15/12 1746)  . TPN (CLINIMIX) +/- additives 80 mL/hr at 01/14/12 1751  . TPN (CLINIMIX) +/- additives     PRN Meds:.acetaminophen, HYDROmorphone (DILAUDID) injection, menthol-cetylpyridinium, ondansetron (ZOFRAN) IV, polyvinyl alcohol, sodium chloride  Assessment/Plan: Ruptured pancreatic pseudocyst with peritonitis  -s/p Exploratory laparotomy with debridement of necrotic pancreas and drainage of the upper abdomen 01/03/12 Dr. Wenda Low.  -Post op drainage from pseudocyst  -Post op VDRF on Vent, extubated 01/11/12  Day 13 flagyl  Day 17 Zosyn  - continue nutrition mgmt per surgery  - NPO for now - pain meds IV   Anemia  - s/p 1 unit PRBC - following CBC   Gastric Ulcer  - continue BID PPI treatment  - holding lovenox secondary to anemia   Deconditioning/Weakness/Immobility  - CIR consult appreciated, pt may be candidate for inpatient rehab  - [facility] team is increasing ambulation  - SCDs for DVT prophylaxis  - encouraged more ambulation today with pt and nurse  C. Diff  colitis  - continue flagyl IV  - enteral flagyl or oral vanc when taking p.o.   Septic Shock  - resolved now   Chronic Narcotic Dependence  - IV dilaudid for pain   ARDS / Acute Resp Failure  - pt off vent since 9/23  -monitoring closely post extubation   Hx of pancreatitis, and alcohol abuse.   Hyponatremia  - volume overloaded  - monitoring closely  - BMP in AM   GERD  - continue PPI every 12 hours (pt has a gastric ulcer)   Elevated liver function tests  - LFTs have returned to normal   Hiatal Hernia  9/18 EGD ([facility]) - 10-12 mm ulcer in the cardia of the stomach, protonix gtt, removed panda    LOS: 21 days   Clanford Johnson 01/16/2012, 8:57 AM  Cleora Fleet, MD, CDE, FAAFP [facility] [place], [place]  [phone]

## 2012-01-16 NOTE — Progress Notes (Signed)
PARENTERAL NUTRITION CONSULT NOTE - FOLLOW UP  Pharmacy Consult for TNA Indication: Severe Pancreatitis  Allergies  Allergen Reactions  . Ciprofloxacin Nausea Only    REACTION: nausea   Patient Measurements: Height: 5\' 6"  (167.6 cm) Weight: 166 lb 10.7 oz (75.6 kg) IBW/kg (Calculated) : 59.3   Vital Signs: Temp: 99 F (37.2 C) (09/28 0556) Temp src: Oral (09/28 0556) BP: 112/69 mmHg (09/28 0556) Pulse Rate: 87  (09/28 0556) Intake/Output from previous day: 09/27 0701 - 09/28 0700 In: -  Out: 2950 [Urine:2950] Intake/Output from this shift:    Labs:  [name] 01/16/12 0559 01/15/12 0508  WBC 14.2* 13.3*  HGB 7.9* 6.8*  HCT 23.7* 22.0*  PLT 645* 606*  APTT -- --  INR -- --    Basename 01/16/12 0559 01/15/12 0508 01/14/12 0500  NA 134* 133* 131*  K 3.6 3.8 3.8  CL 103 102 101  CO2 23 23 22   GLUCOSE 131* 136* 114*  BUN 12 12 12   CREATININE 0.47* 0.52 0.50  LABCREA -- -- --  CREAT24HRUR -- -- --  CALCIUM 7.9* 7.8* 7.9*  MG -- -- 1.9  PHOS -- -- 3.4  PROT -- 5.8* 6.0  ALBUMIN -- 1.4* 1.5*  AST -- 28 29  ALT -- 27 35  ALKPHOS -- 84 96  BILITOT -- 0.3 0.2*  BILIDIR -- -- --  IBILI -- -- --  PREALBUMIN -- -- --  TRIG -- -- --  CHOLHDL -- -- --  CHOL -- -- --   Estimated Creatinine Clearance: 81.6 ml/min (by C-G formula based on Cr of 0.47).    Basename 01/16/12 0048 01/15/12 2043 01/15/12 1947  GLUCAP 126* 113* 124*   Insulin Requirements in the past 24 hours:   CBGs <150, required 8 units of ICU HG protocol last 24  Nutritional Goals:   RD recs 9/16: 1762 kcal, 86-95 grams protein  Reestimated s/p extubation 9/23: 1475-1775 kcal, 94-110 grams protein  Clinimix 5/15 @ 59ml/hr + Lipid 20% MWF will provide 96 g protein/day, 1843 kCal on lipid days, 1363 Kcal on non-lipid days for an average of 1569 Kcal per day/week.  Current nutrition:   Diet: NPO except ice chips  IVF: NS @ KVO  Clinimix E 5/20 @ 80 ml/hr  Assessment:   [name]  admitted 9/8 with epigastric abd pain, fever, chills and N/V. Pt with h/o recurrent pancreatitis and ETOH abuse, s/p ERCP with sphincterotomy on 10/06/2011. Dx with acute pancreatitis this admit, developed ileus.   Rapid response called 9/12 for respiratory decline and lethargy - pt transfer to ICU and CCM consulted. Intubated 9/12. 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   Clinical deterioration with rising WBC and findings suggesting GI perforation, underwent exploratory laparotomy with debridement of necrotic pancreas and drainage of infected, ruptured pseudocyst 9/15. TNA started 9/16.   Possible conversion to enteral feeds now that extubated  Renal: Scr wnl, I/O 2850/3205, UOP=3.1L, Drainage = 85ml Hepatic fxn: AST/ALT previously elevated ?shock liver 2/2 pancreatitis. Now WNL Lytes: K+ remains wnl; Na+ low but unable to adjust in TNA Pre-Alb: improving, 2.3 (9/17), 12.2 (9/23) TG/Cholesterol: wnl 9/17, 9/23 CBGs: At goal <150  Plan:    Continue clinimix to E 5/15 at 80 ml/hr  PO advancement per CCS  TNA to contain IV fat emulsion, standard multivitamins and trace elements only on MWF only due to ongoing shortage   TNA labs Monday/Thursdays  Hessie Knows,  PharmD, BCPS Pager [phone] 01/16/2012 7:35 AM

## 2012-01-17 ENCOUNTER — Inpatient Hospital Stay (HOSPITAL_COMMUNITY): Payer: MEDICAID

## 2012-01-17 DIAGNOSIS — R7989 Other specified abnormal findings of blood chemistry: Secondary | ICD-10-CM

## 2012-01-17 LAB — BASIC METABOLIC PANEL
Calcium: 7.7 mg/dL — ABNORMAL LOW (ref 8.4–10.5)
GFR calc Af Amer: >90 mL/min (ref >90)
GFR calc non Af Amer: >90 mL/min (ref >90)
Potassium: 3.5 mEq/L (ref 3.5–5.1)
Sodium: 132 mEq/L — ABNORMAL LOW (ref 135–145)

## 2012-01-17 LAB — GLUCOSE, CAPILLARY
Glucose-Capillary: 108 mg/dL — ABNORMAL HIGH (ref 70–99)
Glucose-Capillary: 140 mg/dL — ABNORMAL HIGH (ref 70–99)

## 2012-01-17 LAB — CBC
Hemoglobin: 7.8 g/dL — ABNORMAL LOW (ref 12.0–15.0)
MCHC: 32.5 g/dL (ref 30.0–36.0)
Platelets: 637 10*3/uL — ABNORMAL HIGH (ref 150–400)
RDW: 16.9 % — ABNORMAL HIGH (ref 11.5–15.5)

## 2012-01-17 MED ORDER — ENOXAPARIN SODIUM 30 MG/0.3ML ~~LOC~~ SOLN
30.0000 mg | SUBCUTANEOUS | Status: DC
  Administered 2012-01-17 – 2012-01-18 (×2): 30 mg via SUBCUTANEOUS
  Filled 2012-01-17 (×2): qty 0.3

## 2012-01-17 MED ORDER — CLINIMIX E/DEXTROSE (5/15) 5 % IV SOLN
INTRAVENOUS | Status: AC
  Administered 2012-01-17: 17:00:00 via INTRAVENOUS
  Filled 2012-01-17: qty 2000

## 2012-01-17 NOTE — Progress Notes (Signed)
PARENTERAL NUTRITION CONSULT NOTE - FOLLOW UP  Pharmacy Consult for TNA Indication: Severe Pancreatitis  Allergies  Allergen Reactions  . Ciprofloxacin Nausea Only    REACTION: nausea   Patient Measurements: Height: 5\' 6"  (167.6 cm) Weight: 166 lb 10.7 oz (75.6 kg) IBW/kg (Calculated) : 59.3   Vital Signs: Temp: 98.4 F (36.9 C) (09/29 0602) Temp src: Oral (09/29 0602) BP: 111/73 mmHg (09/29 0602) Pulse Rate: 83  (09/29 0602) Intake/Output from previous day: 09/28 0701 - 09/29 0700 In: 1280 [I.V.:240; TPN:1040] Out: 1595 [Urine:1550; Drains:45] Intake/Output from this shift:    Labs:  [facility] 01/17/12 0500 01/16/12 0559 01/15/12 0508  WBC 15.2* 14.2* 13.3*  HGB 7.8* 7.9* 6.8*  HCT 24.0* 23.7* 22.0*  PLT 637* 645* 606*  APTT -- -- --  INR -- -- --    Basename 01/17/12 0500 01/16/12 0559 01/15/12 0508  NA 132* 134* 133*  K 3.5 3.6 3.8  CL 101 103 102  CO2 23 23 23   GLUCOSE 127* 131* 136*  BUN 12 12 12   CREATININE 0.49* 0.47* 0.52  LABCREA -- -- --  CREAT24HRUR -- -- --  CALCIUM 7.7* 7.9* 7.8*  MG -- -- --  PHOS -- -- --  PROT -- -- 5.8*  ALBUMIN -- -- 1.4*  AST -- -- 28  ALT -- -- 27  ALKPHOS -- -- 84  BILITOT -- -- 0.3  BILIDIR -- -- --  IBILI -- -- --  PREALBUMIN -- -- --  TRIG -- -- --  CHOLHDL -- -- --  CHOL -- -- --   Estimated Creatinine Clearance: 81.6 ml/min (by C-G formula based on Cr of 0.49).    Basename 01/17/12 0413 01/16/12 2357 01/16/12 2007  GLUCAP 131* 140* 123*   Insulin Requirements in the past 24 hours:   CBGs <150, required 8 units of ICU HG protocol last 24  Nutritional Goals:   RD recs 9/16: 1762 kcal, 86-95 grams protein  Reestimated s/p extubation 9/23: 1475-1775 kcal, 94-110 grams protein  Clinimix 5/15 @ 66ml/hr + Lipid 20% MWF will provide 96 g protein/day, 1843 kCal on lipid days, 1363 Kcal on non-lipid days for an average of 1569 Kcal per day/week.  Current nutrition:   Diet: NPO except ice chips  IVF:  NS @ KVO  Clinimix E 5/20 @ 80 ml/hr  Assessment:   [name] admitted 9/8 with epigastric abd pain, fever, chills and N/V. Pt with h/o recurrent pancreatitis and ETOH abuse, s/p ERCP with sphincterotomy on 10/06/2011. Dx with acute pancreatitis this admit, developed ileus.   Rapid response called 9/12 for respiratory decline and lethargy - pt transfer to ICU and CCM consulted. Intubated 9/12. 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   Clinical deterioration with rising WBC and findings suggesting GI perforation, underwent exploratory laparotomy with debridement of necrotic pancreas and drainage of infected, ruptured pseudocyst 9/15. TNA started 9/16.   Possible conversion to enteral feeds now that extubated  Renal: Scr wnl Hepatic fxn: AST/ALT previously elevated ?shock liver 2/2 pancreatitis. Now WNL Lytes: K+ remains wnl; Na+ low but unable to adjust in TNA Pre-Alb: improving, 2.3 (9/17), 12.2 (9/23) TG/Cholesterol: wnl 9/17, 9/23 CBGs: At goal <150  Plan:    Continue clinimix to E 5/15 at 80 ml/hr  PO advancement per CCS  TNA to contain IV fat emulsion, standard multivitamins and trace elements only on MWF only due to ongoing shortage   TNA labs Monday/Thursdays  Hessie Knows, PharmD, BCPS Pager [phone] 01/17/2012 7:36 AM

## 2012-01-17 NOTE — Progress Notes (Signed)
[facility] Progress Note  01/17/2012  Pt transferred from PCCM Service  Brief patient profile:  55 yowf adm by [facility] via [facility] ER on 09/07 with acute pancreatitis. H/O ERCP with sphincterotomy on 10/06/2011. On 9/12 RRT was called for respiratory decline and lethargy, subsequently required mechanical ventilation & Exploratory laparotomy 9/15 with debridement of necrotic pancreas and drainage.  Events Since Admission:  9/12 RRT and subsequent transfer to the ICU.  9/14 TFs held due to firm abdomen & sub glottic suctioning, WC higher  9/14 CT abd: increased ascites, Persistent edema/inflammation surrounding the head of the pancreas, with an approximate 1.8 x 2.6 cm pseudocyst at the junction of the head and body. Developing pseudocyst in the pancreatic head measuring approximately 1.3 x 2.6 cm, Large hiatal hernia.  9/14 doppler UE neg for DVT  9/15 1.5 L paracentesis - green brown fluid  9/15 Exploratory laparotomy with debridement of necrotic pancreas and drainage of the upper abdomen  9/18 EGD (Ganem) - 10-12 mm ulcer in the cardia of the stomach, protonix gtt, removed panda  9/20 CT abd: Decrease in moderate abdominal and pelvic ascites with intraperitoneal drainage catheters now seen in place. Mild increase in small bilateral pleural effusions. No significant change in diffuse body wall edema. Persistent small pseudocyst in the area the pancreatic head, which is decreased in size since earlier exam  9/23 extubated  9/24 dc fentanyl and start dilaudid.  9/24 change to SDU status.  9/26 transferred to [name]   Subjective: Pt started sips today.  Pt says that she is not sure about having the foley removed because she isn't ambulating well.  Still very weak.    Objective:  Vital signs in last 24 hours: Filed Vitals:   01/16/12 1410 01/16/12 2237 01/17/12 0602 01/17/12 1448  BP: 117/66 116/73 111/73 127/56  Pulse: 93 90 83 85  Temp: 98.2 F (36.8 C) 98.9 F (37.2 C) 98.4 F (36.9 C)  98.4 F (36.9 C)  TempSrc: Oral Oral Oral Oral  Resp: 18 18 18 16   Height:      Weight:      SpO2: 93% 90% 90% 93%   Weight change:   Intake/Output Summary (Last 24 hours) at 01/17/12 1521 Last data filed at 01/17/12 0700  Gross per 24 hour  Intake   1040 ml  Output   1550 ml  Net   -510 ml   Lab Results  Component Value Date   HGBA1C 6.0* 11/27/2010   HGBA1C  Value: 5.5 (NOTE)                                                                       According to the ADA Clinical Practice Recommendations for 2011, when HbA1c is used as a screening test:   >=6.5%   Diagnostic of Diabetes Mellitus           (if abnormal result  is confirmed)  5.7-6.4%   Increased risk of developing Diabetes Mellitus  References:Diagnosis and Classification of Diabetes Mellitus,Diabetes Care,2011,34(Suppl 1):S62-S69 and Standards of Medical Care in         Diabetes - 2011,Diabetes Care,2011,34  (Suppl 1):S11-S61. 01/18/2010   HGBA1C  Value: 5.4 (NOTE)  According to the ADA Clinical Practice Recommendations for 2011, when HbA1c is used as a screening test:   >=6.5%   Diagnostic of Diabetes Mellitus           (if abnormal result  is confirmed)  5.7-6.4%   Increased risk of developing Diabetes Mellitus  References:Diagnosis and Classification of Diabetes Mellitus,Diabetes Care,2011,34(Suppl 1):S62-S69 and Standards of Medical Care in         Diabetes - 2011,Diabetes Care,2011,34  (Suppl 1):S11-S61. 09/15/2009   Lab Results  Component Value Date   LDLCALC 78 09/23/2011   CREATININE 0.49* 01/17/2012    Review of Systems As above, otherwise all reviewed and reported negative  Physical Exam General: awake, alert, no distress, cooperative  HEENT: WNL  Cardiovascular: RRR s M.  Pulmonary/Chest: clear anteriorly, shallow BS, decreased bs bases  Abdominal: a little less distended, tender diffusely, active bowel sounds heard, Midline lap incision with 2  JP drains with drainage  Musculoskeletal: 1-2+ edema and no tenderness.  Neurological: no focal deficits, reports less pain  Lab Results: Results for orders placed during the hospital encounter of 12/26/11 (from the past 24 hour(s))  GLUCOSE, CAPILLARY     Status: Abnormal   Collection Time   01/16/12  4:22 PM      Component Value Range   Glucose-Capillary 163 (*) 70 - 99 mg/dL  GLUCOSE, CAPILLARY     Status: Abnormal   Collection Time   01/16/12  8:07 PM      Component Value Range   Glucose-Capillary 123 (*) 70 - 99 mg/dL  GLUCOSE, CAPILLARY     Status: Abnormal   Collection Time   01/16/12 11:57 PM      Component Value Range   Glucose-Capillary 140 (*) 70 - 99 mg/dL  GLUCOSE, CAPILLARY     Status: Abnormal   Collection Time   01/17/12  4:13 AM      Component Value Range   Glucose-Capillary 131 (*) 70 - 99 mg/dL  CBC     Status: Abnormal   Collection Time   01/17/12  5:00 AM      Component Value Range   WBC 15.2 (*) 4.0 - 10.5 K/uL   RBC 2.69 (*) 3.87 - 5.11 MIL/uL   Hemoglobin 7.8 (*) 12.0 - 15.0 g/dL   HCT 16.1 (*) 09.6 - 04.5 %   MCV 89.2  78.0 - 100.0 fL   MCH 29.0  26.0 - 34.0 pg   MCHC 32.5  30.0 - 36.0 g/dL   RDW 40.9 (*) 81.1 - 91.4 %   Platelets 637 (*) 150 - 400 K/uL  BASIC METABOLIC PANEL     Status: Abnormal   Collection Time   01/17/12  5:00 AM      Component Value Range   Sodium 132 (*) 135 - 145 mEq/L   Potassium 3.5  3.5 - 5.1 mEq/L   Chloride 101  96 - 112 mEq/L   CO2 23  19 - 32 mEq/L   Glucose, Bld 127 (*) 70 - 99 mg/dL   BUN 12  6 - 23 mg/dL   Creatinine, Ser 7.82 (*) 0.50 - 1.10 mg/dL   Calcium 7.7 (*) 8.4 - 10.5 mg/dL   GFR calc non Af Amer >90  >90 mL/min   GFR calc Af Amer >90  >90 mL/min  GLUCOSE, CAPILLARY     Status: Abnormal   Collection Time   01/17/12  7:51 AM      Component Value Range   Glucose-Capillary 145 (*)  70 - 99 mg/dL  GLUCOSE, CAPILLARY     Status: Abnormal   Collection Time   01/17/12 11:50 AM      Component Value Range     Glucose-Capillary 129 (*) 70 - 99 mg/dL    Micro Results: No results found for this or any previous visit (from the past 240 hour(s)).  Medications:  Scheduled Meds:   . antiseptic oral rinse  15 mL Mouth Rinse QID  . chlorhexidine  15 mL Mouth Rinse BID  . enoxaparin (LOVENOX) injection  30 mg Subcutaneous Q24H  . folic acid  1 mg Intravenous Daily  . insulin aspart  2-6 Units Subcutaneous Q4H  . LORazepam  1 mg Intravenous Q8H  . metronidazole  500 mg Intravenous Q8H  . octreotide  100 mcg Subcutaneous Q8H  . pantoprazole (PROTONIX) IV  40 mg Intravenous Q12H  . piperacillin-tazobactam (ZOSYN)  IV  3.375 g Intravenous Q8H   Continuous Infusions:   . sodium chloride 20 mL/hr (01/13/12 0042)  . TPN (CLINIMIX) +/- additives 80 mL/hr at 01/15/12 1746   And  . fat emulsion 250 mL (01/15/12 1746)  . TPN (CLINIMIX) +/- additives 80 mL/hr at 01/16/12 1724  . TPN (CLINIMIX) +/- additives     PRN Meds:.acetaminophen, HYDROmorphone (DILAUDID) injection, menthol-cetylpyridinium, ondansetron (ZOFRAN) IV, polyvinyl alcohol, sodium chloride  Assessment/Plan: Ruptured pancreatic pseudocyst with peritonitis  -s/p Exploratory laparotomy with debridement of necrotic pancreas and drainage of the upper abdomen 01/03/12 Dr. Wenda Low.  -Post op drainage from pseudocyst  -Post op VDRF on Vent, extubated 01/11/12  Day 13 flagyl  Day 17 Zosyn  - continue nutrition mgmt per surgery  - NPO for now  - pain meds IV   Anemia  - s/p 1 unit PRBC  - following CBC  - Hg about 7.5  Gastric Ulcer  - continue BID PPI treatment  - holding lovenox secondary to anemia   Deconditioning/Weakness/Immobility  - CIR consult appreciated, pt may be candidate for inpatient rehab  - CCS team is increasing ambulation  - SCDs for DVT prophylaxis  - encouraged more ambulation today with pt and nurse   C. Diff colitis  - continue flagyl IV for now - enteral flagyl or oral vanc when tolerating p.o.  -  repeat c.diff test ordered 9/29  Septic Shock  - resolved now   Chronic Narcotic Dependence  - IV dilaudid for pain  - pain mgmt per surgery  ARDS / Acute Resp Failure  - pt off vent since 9/23  -monitoring closely post extubation   Hx of pancreatitis, and alcohol abuse.   Hyponatremia  - volume overloaded  - monitoring closely  - BMP in AM   GERD  - continue PPI every 12 hours (pt has a gastric ulcer)   Elevated liver function tests  - LFTs have returned to normal   Hiatal Hernia  9/18 EGD (Ganem) - 10-12 mm ulcer in the cardia of the stomach, protonix gtt, removed panda    LOS: 22 days   Clanford Johnson 01/17/2012, 3:21 PM  Cleora Fleet, MD, CDE, FAAFP [name] [place], [place]  [phone]

## 2012-01-17 NOTE — Progress Notes (Signed)
General Surgery Note  LOS: 22 days  POD# 14 Room - 1504  Assessment/Plan: 1.  Exploratory lap, debridement of necrotic pancreas - M. Daphine Deutscher - 01/03/2012  For ruptured pseudocyst.  On Flagyl (day 14) and Zosyn (day 18).  2 abdominal drains, both draining thick creamy fluid.  Will plan repeat CT scan in AM to evaluate drains, pancreatic necrosis, and GI integrity.  The patient is still NPO.   I will start sips from the floor today.  2.  Gastric ulcer in cardia  Seen on endo - S. Ganem - 01/03/2012 3.  Pancreatitis  4.  Alcohol abuse  5.  Leukocytosis   WBC - 15,200 - 01/17/2012  6.  Anemia  Hgb - 7.8 - 01/17/2012 7.  Acute respiratory failure   Extubated - 01/11/2012 8.  C. difficile colitis   Will repeat C.Diff to see if we can get her off isolation. 9.  Malnutrition  On TPN.  Labs pending for tomorrow. 10.  DVT prophylaxis - to start Lovenox.  Subjective:  Slowing doing better.  Still weak.  Does not ambulate enough. Objective:   Filed Vitals:   01/17/12 0602  BP: 111/73  Pulse: 83  Temp: 98.4 F (36.9 C)  Resp: 18     Intake/Output from previous day:  09/28 0701 - 09/29 0700 In: 1280 [I.V.:240; TPN:1040] Out: 1595 [Urine:1550; Drains:45]  Intake/Output this shift:      Physical Exam:   General: WN older WF who is alert and oriented.    HEENT: Normal. Pupils equal. .   Lungs: Clear   Abdomen: Mild distention.  2 Blake drains - one mid right abdomen and one mid left abdomen.   Wound: Mid line wound with VAC.  They change the VAC MWF.   Psychiatric: Has normal mood and affect.   Lab Results:    Basename 01/17/12 0500 01/16/12 0559  WBC 15.2* 14.2*  HGB 7.8* 7.9*  HCT 24.0* 23.7*  PLT 637* 645*    BMET   Basename 01/17/12 0500 01/16/12 0559  NA 132* 134*  K 3.5 3.6  CL 101 103  CO2 23 23  GLUCOSE 127* 131*  BUN 12 12  CREATININE 0.49* 0.47*  CALCIUM 7.7* 7.9*    PT/INR  No results found for this basename: LABPROT:2,INR:2 in the last 72  hours  ABG  No results found for this basename: PHART:2,PCO2:2,PO2:2,HCO3:2 in the last 72 hours   Studies/Results:  No results found.   Anti-infectives:   Anti-infectives     Start     Dose/Rate Route Frequency Ordered Stop   01/03/12 1600   metroNIDAZOLE (FLAGYL) IVPB 500 mg        500 mg 100 mL/hr over 60 Minutes Intravenous Every 8 hours 01/03/12 1525     12/31/11 1100   vancomycin (VANCOCIN) 750 mg in sodium chloride 0.9 % 150 mL IVPB  Status:  Discontinued        750 mg 150 mL/hr over 60 Minutes Intravenous Every 12 hours 12/31/11 0958 01/03/12 0849   12/30/11 1000  piperacillin-tazobactam (ZOSYN) IVPB 3.375 g       3.375 g 12.5 mL/hr over 240 Minutes Intravenous Every 8 hours 12/30/11 0901            Ovidio Kin, MD, FACS Pager: [phone],   [facility] Office: [phone] 01/17/2012

## 2012-01-17 NOTE — Progress Notes (Signed)
Politely refusing foley D/C-doesn't feel ready to get up to bedside toilet a lot.

## 2012-01-18 ENCOUNTER — Inpatient Hospital Stay (HOSPITAL_COMMUNITY): Payer: MEDICAID

## 2012-01-18 DIAGNOSIS — F329 Major depressive disorder, single episode, unspecified: Secondary | ICD-10-CM

## 2012-01-18 DIAGNOSIS — R197 Diarrhea, unspecified: Secondary | ICD-10-CM

## 2012-01-18 LAB — DIFFERENTIAL
Basophils Absolute: 0.2 10*3/uL — ABNORMAL HIGH (ref 0.0–0.1)
Basophils Relative: 1 % (ref 0–1)
Lymphs Abs: 2 10*3/uL (ref 0.7–4.0)
Monocytes Relative: 9 % (ref 3–12)
Neutro Abs: 11.8 10*3/uL — ABNORMAL HIGH (ref 1.7–7.7)

## 2012-01-18 LAB — CBC
HCT: 23.5 % — ABNORMAL LOW (ref 36.0–46.0)
Hemoglobin: 7.5 g/dL — ABNORMAL LOW (ref 12.0–15.0)
MCV: 89.7 fL (ref 78.0–100.0)
RDW: 17.4 % — ABNORMAL HIGH (ref 11.5–15.5)
WBC: 15.7 10*3/uL — ABNORMAL HIGH (ref 4.0–10.5)

## 2012-01-18 LAB — GLUCOSE, CAPILLARY
Glucose-Capillary: 110 mg/dL — ABNORMAL HIGH (ref 70–99)
Glucose-Capillary: 111 mg/dL — ABNORMAL HIGH (ref 70–99)
Glucose-Capillary: 151 mg/dL — ABNORMAL HIGH (ref 70–99)
Glucose-Capillary: 119 mg/dL — ABNORMAL HIGH (ref 70–99)

## 2012-01-18 LAB — PREALBUMIN: Prealbumin: 11.4 mg/dL — ABNORMAL LOW (ref 17.0–34.0)

## 2012-01-18 LAB — COMPREHENSIVE METABOLIC PANEL
ALT: 17 U/L (ref 0–35)
AST: 28 U/L (ref 0–37)
Albumin: 1.6 g/dL — ABNORMAL LOW (ref 3.5–5.2)
CO2: 22 mEq/L (ref 19–32)
Calcium: 8.1 mg/dL — ABNORMAL LOW (ref 8.4–10.5)
Chloride: 103 mEq/L (ref 96–112)
GFR calc non Af Amer: >90 mL/min (ref >90)
Sodium: 134 mEq/L — ABNORMAL LOW (ref 135–145)
Total Bilirubin: 0.4 mg/dL (ref 0.3–1.2)

## 2012-01-18 LAB — CHOLESTEROL, TOTAL: Cholesterol: 83 mg/dL (ref 0–200)

## 2012-01-18 LAB — TRIGLYCERIDES: Triglycerides: 130 mg/dL (ref <150)

## 2012-01-18 MED ORDER — FAT EMULSION 20 % IV EMUL
250.0000 mL | INTRAVENOUS | Status: AC
  Administered 2012-01-18: 250 mL via INTRAVENOUS
  Filled 2012-01-18: qty 250

## 2012-01-18 MED ORDER — IOHEXOL 300 MG/ML  SOLN
100.0000 mL | Freq: Once | INTRAMUSCULAR | Status: AC | PRN
  Administered 2012-01-18: 100 mL via INTRAVENOUS

## 2012-01-18 MED ORDER — ENOXAPARIN SODIUM 40 MG/0.4ML ~~LOC~~ SOLN
40.0000 mg | SUBCUTANEOUS | Status: DC
  Administered 2012-01-20 – 2012-01-26 (×7): 40 mg via SUBCUTANEOUS
  Filled 2012-01-18 (×8): qty 0.4

## 2012-01-18 MED ORDER — ENOXAPARIN SODIUM 40 MG/0.4ML ~~LOC~~ SOLN: 40.0000 mg | SUBCUTANEOUS | Status: DC

## 2012-01-18 MED ORDER — INSULIN ASPART 100 UNIT/ML ~~LOC~~ SOLN
2.0000 [IU] | Freq: Four times a day (QID) | SUBCUTANEOUS | Status: DC
  Administered 2012-01-19: 2 [IU] via SUBCUTANEOUS
  Administered 2012-01-20: 4 [IU] via SUBCUTANEOUS
  Administered 2012-01-20 – 2012-01-25 (×12): 2 [IU] via SUBCUTANEOUS

## 2012-01-18 MED ORDER — ZINC TRACE METAL 1 MG/ML IV SOLN
INTRAVENOUS | Status: AC
  Administered 2012-01-18: 18:00:00 via INTRAVENOUS
  Filled 2012-01-18: qty 2000

## 2012-01-18 NOTE — Progress Notes (Addendum)
12 Days Post-Op  Subjective: No real change, refused to have foley out, says she wasn't up all weekend and can't get OOB by herself. 9 mg dilaudid yesterday and 4 mg, so far today. Objective: Vital signs in last 24 hours: Temp:  [98.4 F (36.9 C)-99.8 F (37.7 C)] 99.1 F (37.3 C) (09/30 0600) Pulse Rate:  [85-93] 89  (09/30 0600) Resp:  [16-18] 18  (09/30 0600) BP: (123-131)/(56-76) 123/74 mmHg (09/30 0600) SpO2:  [91 %-93 %] 91 % (09/30 0600) Last BM Date: 01/18/12  Intake/Output from previous day: 09/29 0701 - 09/30 0700 In: 10 [I.V.:10] Out: 3560 [Urine:3500; Drains:60] Intake/Output this shift:    General appearance: alert, cooperative and mild distress Resp: clear to auscultation bilaterally and anterior GI: open wound, looks good, bottom portion is sealing up.  sides of the wound looks very good.  The base of the wound shows loose fascial sutrures, and the white, fibrous, nonviable tissue at the base.  No drainage in the base of the wound.  Drainage from each JP is perhaps a little clearer than last week.  Lab Results:   Basename 01/18/12 0510 01/17/12 0500  WBC 15.7* 15.2*  HGB 7.5* 7.8*  HCT 23.5* 24.0*  PLT 636* 637*    BMET  Basename 01/18/12 0510 01/17/12 0500  NA 134* 132*  K 3.5 3.5  CL 103 101  CO2 22 23  GLUCOSE 116* 127*  BUN 12 12  CREATININE 0.50 0.49*  CALCIUM 8.1* 7.7*   PT/INR No results found for this basename: LABPROT:2,INR:2 in the last 72 hours   Lab 01/18/12 0510 01/15/12 0508 01/14/12 0500  AST 28 28 29   ALT 17 27 35  ALKPHOS 101 84 96  BILITOT 0.4 0.3 0.2*  PROT 6.3 5.8* 6.0  ALBUMIN 1.6* 1.4* 1.5*     Lipase     Component Value Date/Time   LIPASE 113* 01/11/2012 0540     Studies/Results: No results found.  Medications:    . antiseptic oral rinse  15 mL Mouth Rinse QID  . chlorhexidine  15 mL Mouth Rinse BID  . enoxaparin (LOVENOX) injection  30 mg Subcutaneous Q24H  . folic acid  1 mg Intravenous Daily  .  insulin aspart  2-6 Units Subcutaneous Q6H  . LORazepam  1 mg Intravenous Q8H  . metronidazole  500 mg Intravenous Q8H  . octreotide  100 mcg Subcutaneous Q8H  . pantoprazole (PROTONIX) IV  40 mg Intravenous Q12H  . piperacillin-tazobactam (ZOSYN)  IV  3.375 g Intravenous Q8H  . DISCONTD: insulin aspart  2-6 Units Subcutaneous Q4H    Assessment/Plan Ruptured pseudocyst with peritonitis, s/p Exploratory laparotomy with debridement of necrotic pancreas and drainage of the upper abdomen 01/03/12 Dr. Wenda Low.  Post op drainage from pseudocyst  WBC still up Post op VDRF on Vent, extubated 01/11/12  Anemia H/H= 7.3/22.3  Hx of pancreatitis, and alcohol abuse.  GERD  Elevated liver function tests  GERD (gastroesophageal reflux disease)  Hiatal Hernia  9/18 EGD (Ganem) - 10-12 mm ulcer in the cardia of the stomach, protonix gtt, removed panda  Day 15 flagyl, started 9/15 Day 19 Zosyn,  started 9/11   Plan:  Get her up and moving.  This is going to be a big issue.  OT/PT both ordered.  CT scan today.  CT shows extensive loculated fluid both sides of abdomen, most prominent is LUQ.  Bilat effusions also.  Will ask IR to see and percutaneous drain placement for intraabdominal fluid collection.  Defer effusions to Medicine.Recheck labs AM, after drain placed.  LOS: 23 days    [name] 01/18/2012

## 2012-01-18 NOTE — Progress Notes (Addendum)
PARENTERAL NUTRITION CONSULT NOTE - FOLLOW UP  Pharmacy Consult for TNA Indication: Severe Pancreatitis  Allergies  Allergen Reactions  . Ciprofloxacin Nausea Only    REACTION: nausea   Patient Measurements: Height: 5\' 6"  (167.6 cm) Weight: 166 lb 10.7 oz (75.6 kg) IBW/kg (Calculated) : 59.3   Vital Signs: Temp: 99.1 F (37.3 C) (09/30 0600) Temp src: Oral (09/30 0600) BP: 123/74 mmHg (09/30 0600) Pulse Rate: 89  (09/30 0600) Intake/Output from previous day: 09/29 0701 - 09/30 0700 In: 10 [I.V.:10] Out: 3560 [Urine:3500; Drains:60] Intake/Output from this shift:    Labs:  Basename 01/18/12 0510 01/17/12 0500 01/16/12 0559  WBC 15.7* 15.2* 14.2*  HGB 7.5* 7.8* 7.9*  HCT 23.5* 24.0* 23.7*  PLT 636* 637* 645*  APTT -- -- --  INR -- -- --    Basename 01/18/12 0510 01/17/12 0500 01/16/12 0559  NA 134* 132* 134*  K 3.5 3.5 3.6  CL 103 101 103  CO2 22 23 23   GLUCOSE 116* 127* 131*  BUN 12 12 12   CREATININE 0.50 0.49* 0.47*  LABCREA -- -- --  CREAT24HRUR -- -- --  CALCIUM 8.1* 7.7* 7.9*  MG 1.8 -- --  PHOS 3.6 -- --  PROT 6.3 -- --  ALBUMIN 1.6* -- --  AST 28 -- --  ALT 17 -- --  ALKPHOS 101 -- --  BILITOT 0.4 -- --  BILIDIR -- -- --  IBILI -- -- --  PREALBUMIN -- -- --  TRIG -- -- --  CHOLHDL -- -- --  CHOL -- -- --   Estimated Creatinine Clearance: 81.6 ml/min (by C-G formula based on Cr of 0.5).    Basename 01/18/12 0420 01/18/12 0018 01/17/12 2047  GLUCAP 111* 151* 110*   Insulin Requirements in the past 24 hours:   CBGs <150, required 8 units of ICU HG protocol last 24  Nutritional Goals:   RD recs 9/16: 1762 kcal, 86-95 grams protein  Reestimated s/p extubation 9/23: 1475-1775 kcal, 94-110 grams protein  Clinimix 5/15 @ 58ml/hr + Lipid 20% MWF will provide 96 g protein/day, 1843 kCal on lipid days, 1363 Kcal on non-lipid days for an average of 1569 Kcal per day/week.  Current nutrition:   Diet: NPO except ice chips  IVF: NS @  KVO  Clinimix E 5/20 @ 80 ml/hr  Assessment:   [name] admitted 9/8 with epigastric abd pain, fever, chills and N/V. Pt with h/o recurrent pancreatitis and ETOH abuse, s/p ERCP with sphincterotomy on 10/06/2011. Dx with acute pancreatitis this admit, developed ileus.   Rapid response called 9/12 for respiratory decline and lethargy - pt transfer to ICU and CCM consulted. Intubated 9/12. 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   Clinical deterioration with rising WBC and findings suggesting GI perforation, underwent exploratory laparotomy with debridement of necrotic pancreas and drainage of infected, ruptured pseudocyst 9/15. TNA started 9/16.   Possible conversion to enteral feeds now that extubated  Renal: Scr wnl, I/O = incomplete, excellent UOP, drains 60ml Hepatic fxn: AST/ALT previously elevated ?shock liver 2/2 pancreatitis. Now WNL Lytes: Na+ slightly low but unable to adjust in TNA, Ca = 8.1 but 10.02 when corrected for albumin Pre-Alb: improving, 2.3 (9/17), 12.2 (9/23) TG/Cholesterol: wnl 9/17, 9/23 CBGs: At goal <150  Plan:    Continue clinimix to E 5/15 at 80 ml/hr  PO advancement per CCS, started on sips/chips 9/29  TNA to contain IV fat emulsion, standard  multivitamins and trace elements only on MWF only due to ongoing shortage   TNA labs Monday/Thursdays  Follow-up trigs/prealbumin (pending from this am lab draw)  Change CBGs to q6h, stable and within goal 150mg /dl   Juliette Alcide, PharmD, BCPS.   Pager: [phone] 01/18/2012 7:09 AM

## 2012-01-18 NOTE — Progress Notes (Signed)
Rehab admissions - Evaluated for possible admission.  Patient currently on TPN and diet has not been advanced yet.   She lives alone and is likely to need assistance post discharge.  Currently requiring total assist + 2 for mobility.  Not medically ready for inpatient rehab or discharge at this point.  I will follow progress for now.  Call me for questions.  #[phone]

## 2012-01-18 NOTE — H&P (Signed)
[name] is an 56 y.o. female.   Chief Complaint: pancreatitis; exp laparotomy 9/15 - necrotic pancreas and 2 drains New collection in abdomen Scheduled for drain placement by IR HPI: GERD; high LFTs; etoh; pancreatitis  Past Medical History  Diagnosis Date  . GERD (gastroesophageal reflux disease)   . Rosacea   . Elevated liver function tests   . Wears glasses   . Chronic diarrhea   . Alcohol abuse     H/o withdrawal   . PONV (postoperative nausea and vomiting)   . Pancreatitis     Past Surgical History  Procedure Date  . Cholecystectomy   . Shoulder surgery [id number]  . Diagnostic mammogram 2008  . Tubal ligation   . Colonoscopy Never  . Esophagogastroduodenoscopy 03/21/2011    Procedure: ESOPHAGOGASTRODUODENOSCOPY (EGD);  Surgeon: Yancey Flemings, MD;  Location: [facility] ENDOSCOPY;  Service: Endoscopy;  Laterality: N/A;  Patient may need to be done at bedside tomorrow depending on status  . Ercp 10/06/2011    Procedure: ENDOSCOPIC RETROGRADE CHOLANGIOPANCREATOGRAPHY (ERCP);  Surgeon: Theda Belfast, MD;  Location: [facility] ENDOSCOPY;  Service: Endoscopy;  Laterality: N/A;  . Laparotomy 01/03/2012    Procedure: EXPLORATORY LAPAROTOMY;  Surgeon: Valarie Merino, MD;  Location: [facility] ORS;  Service: General;  Laterality: N/A;  debridement of necrotic pancreas and placement of drains x2  . Esophagogastroduodenoscopy 01/06/2012    Procedure: ESOPHAGOGASTRODUODENOSCOPY (EGD);  Surgeon: Graylin Shiver, MD;  Location: [facility] ENDOSCOPY;  Service: Endoscopy;  Laterality: N/A;  bedside    Family History  Problem Relation Age of Onset  . Stroke Mother   . Heart disease Father   . Heart failure Father   . Heart disease Sister    Social History:  reports that she quit smoking about 33 years ago. She has never used smokeless tobacco. She reports that she drinks about 1.5 - 2 ounces of alcohol per week. She reports that she does not use illicit drugs.  Allergies:  Allergies  Allergen Reactions  .  Ciprofloxacin Nausea Only    REACTION: nausea    Medications Prior to Admission  Medication Sig Dispense Refill  . LORazepam (ATIVAN) 1 MG tablet Take 1 mg by mouth 2 (two) times daily as needed. Anxiety      . metoCLOPramide (REGLAN) 10 MG tablet Take 10 mg by mouth daily as needed. For nausea      . oxyCODONE (OXY IR/ROXICODONE) 5 MG immediate release tablet Take 5 mg by mouth every 4 (four) hours as needed.        Results for orders placed during the hospital encounter of 12/26/11 (from the past 48 hour(s))  GLUCOSE, CAPILLARY     Status: Abnormal   Collection Time   01/16/12  8:07 PM      Component Value Range Comment   Glucose-Capillary 123 (*) 70 - 99 mg/dL   GLUCOSE, CAPILLARY     Status: Abnormal   Collection Time   01/16/12 11:57 PM      Component Value Range Comment   Glucose-Capillary 140 (*) 70 - 99 mg/dL   GLUCOSE, CAPILLARY     Status: Abnormal   Collection Time   01/17/12  4:13 AM      Component Value Range Comment   Glucose-Capillary 131 (*) 70 - 99 mg/dL   CBC     Status: Abnormal   Collection Time   01/17/12  5:00 AM      Component Value Range Comment   WBC 15.2 (*) 4.0 - 10.5  K/uL    RBC 2.69 (*) 3.87 - 5.11 MIL/uL    Hemoglobin 7.8 (*) 12.0 - 15.0 g/dL    HCT 16.1 (*) 09.6 - 46.0 %    MCV 89.2  78.0 - 100.0 fL    MCH 29.0  26.0 - 34.0 pg    MCHC 32.5  30.0 - 36.0 g/dL    RDW 04.5 (*) 40.9 - 15.5 %    Platelets 637 (*) 150 - 400 K/uL   BASIC METABOLIC PANEL     Status: Abnormal   Collection Time   01/17/12  5:00 AM      Component Value Range Comment   Sodium 132 (*) 135 - 145 mEq/L    Potassium 3.5  3.5 - 5.1 mEq/L    Chloride 101  96 - 112 mEq/L    CO2 23  19 - 32 mEq/L    Glucose, Bld 127 (*) 70 - 99 mg/dL    BUN 12  6 - 23 mg/dL    Creatinine, Ser 8.11 (*) 0.50 - 1.10 mg/dL    Calcium 7.7 (*) 8.4 - 10.5 mg/dL    GFR calc non Af Amer >90  >90 mL/min    GFR calc Af Amer >90  >90 mL/min   GLUCOSE, CAPILLARY     Status: Abnormal   Collection Time    01/17/12  7:51 AM      Component Value Range Comment   Glucose-Capillary 145 (*) 70 - 99 mg/dL   GLUCOSE, CAPILLARY     Status: Abnormal   Collection Time   01/17/12 11:50 AM      Component Value Range Comment   Glucose-Capillary 129 (*) 70 - 99 mg/dL   GLUCOSE, CAPILLARY     Status: Abnormal   Collection Time   01/17/12  4:23 PM      Component Value Range Comment   Glucose-Capillary 108 (*) 70 - 99 mg/dL   GLUCOSE, CAPILLARY     Status: Abnormal   Collection Time   01/17/12  8:47 PM      Component Value Range Comment   Glucose-Capillary 110 (*) 70 - 99 mg/dL   GLUCOSE, CAPILLARY     Status: Abnormal   Collection Time   01/18/12 12:18 AM      Component Value Range Comment   Glucose-Capillary 151 (*) 70 - 99 mg/dL   GLUCOSE, CAPILLARY     Status: Abnormal   Collection Time   01/18/12  4:20 AM      Component Value Range Comment   Glucose-Capillary 111 (*) 70 - 99 mg/dL   COMPREHENSIVE METABOLIC PANEL     Status: Abnormal   Collection Time   01/18/12  5:10 AM      Component Value Range Comment   Sodium 134 (*) 135 - 145 mEq/L    Potassium 3.5  3.5 - 5.1 mEq/L    Chloride 103  96 - 112 mEq/L    CO2 22  19 - 32 mEq/L    Glucose, Bld 116 (*) 70 - 99 mg/dL    BUN 12  6 - 23 mg/dL    Creatinine, Ser 9.14  0.50 - 1.10 mg/dL    Calcium 8.1 (*) 8.4 - 10.5 mg/dL    Total Protein 6.3  6.0 - 8.3 g/dL    Albumin 1.6 (*) 3.5 - 5.2 g/dL    AST 28  0 - 37 U/L    ALT 17  0 - 35 U/L    Alkaline Phosphatase 101  39 - 117 U/L    Total Bilirubin 0.4  0.3 - 1.2 mg/dL    GFR calc non Af Amer >90  >90 mL/min    GFR calc Af Amer >90  >90 mL/min   MAGNESIUM     Status: Normal   Collection Time   01/18/12  5:10 AM      Component Value Range Comment   Magnesium 1.8  1.5 - 2.5 mg/dL   PHOSPHORUS     Status: Normal   Collection Time   01/18/12  5:10 AM      Component Value Range Comment   Phosphorus 3.6  2.3 - 4.6 mg/dL   CBC     Status: Abnormal   Collection Time   01/18/12  5:10 AM       Component Value Range Comment   WBC 15.7 (*) 4.0 - 10.5 K/uL    RBC 2.62 (*) 3.87 - 5.11 MIL/uL    Hemoglobin 7.5 (*) 12.0 - 15.0 g/dL    HCT 41.3 (*) 24.4 - 46.0 %    MCV 89.7  78.0 - 100.0 fL    MCH 28.6  26.0 - 34.0 pg    MCHC 31.9  30.0 - 36.0 g/dL    RDW 01.0 (*) 27.2 - 15.5 %    Platelets 636 (*) 150 - 400 K/uL   DIFFERENTIAL     Status: Abnormal   Collection Time   01/18/12  5:10 AM      Component Value Range Comment   Neutrophils Relative 75  43 - 77 %    Lymphocytes Relative 13  12 - 46 %    Monocytes Relative 9  3 - 12 %    Eosinophils Relative 2  0 - 5 %    Basophils Relative 1  0 - 1 %    Neutro Abs 11.8 (*) 1.7 - 7.7 K/uL    Lymphs Abs 2.0  0.7 - 4.0 K/uL    Monocytes Absolute 1.4 (*) 0.1 - 1.0 K/uL    Eosinophils Absolute 0.3  0.0 - 0.7 K/uL    Basophils Absolute 0.2 (*) 0.0 - 0.1 K/uL    RBC Morphology POLYCHROMASIA PRESENT   SPHEROCYTES   WBC Morphology MILD LEFT SHIFT (1-5% METAS, OCC MYELO, OCC BANDS)     CHOLESTEROL, TOTAL     Status: Normal   Collection Time   01/18/12  5:10 AM      Component Value Range Comment   Cholesterol 83  0 - 200 mg/dL   TRIGLYCERIDES     Status: Normal   Collection Time   01/18/12  5:10 AM      Component Value Range Comment   Triglycerides 130  <150 mg/dL   PREALBUMIN     Status: Abnormal   Collection Time   01/18/12  5:10 AM      Component Value Range Comment   Prealbumin 11.4 (*) 17.0 - 34.0 mg/dL   GLUCOSE, CAPILLARY     Status: Abnormal   Collection Time   01/18/12 12:41 PM      Component Value Range Comment   Glucose-Capillary 114 (*) 70 - 99 mg/dL    Ct Abdomen Pelvis W Contrast  01/18/2012  *RADIOLOGY REPORT*  Clinical Data: Acute pancreatitis.  Epigastric pain with fever. Pancreatic pseudocyst.  CT ABDOMEN AND PELVIS WITH CONTRAST  Technique:  Multidetector CT imaging of the abdomen and pelvis was performed following the standard protocol during bolus administration of intravenous contrast.  Contrast: OMNIPAQUE  IOHEXOL  300 MG/ML  SOLN  Comparison: CT scan dated 01/02/2012  Findings: The patient has significantly increased moderate bilateral pleural effusions with secondary atelectasis at the lung bases.  Heart size is normal. No change in the large hiatal hernia.  Two pseudocyst drainage catheters are present in the upper abdomen. There has been slight decrease in the fluid along the left pericolic gutter.  The patient now has increased enhancement of the margins of the loculated fluid in the abdomen and pelvis.  There is a fluid collection which extends from the overlying the top of the right lobe of the liver down the right pericolic gutter into the pelvis.  This collection measures 35 x 17 x 5.3 cm.  The drainage catheter in the left upper quadrant is within the left upper quadrant recess at this fluid collection that I suspect that the fluid with draining pattern with a midline pelvic catheter.  There is a small amount of residual pancreatic fluid around the mid abdominal catheter.  This has decreased.  The liver, pancreas, adrenal glands, and kidneys are normal and stable.  Small pancreatic pseudocyst in the head of the pancreas is better defined measures 16 mm.  No dilated bowel.  Uterus and ovaries are normal.  No acute osseous abnormality.  IMPRESSION:  1.  The patient has developed an enhancing rim around the extensive loculated fluid in both sides of the abdomen consistent with a pseudocyst.  The most prominent portion of the collection is in the right side of the abdomen and the pelvis.  The draining in the left upper quadrant is within the recess at this collection but I suspect another drain in the pelvis might be useful. 2.  Slightly better definition of the small pseudocyst in the head of the pancreas. 3.  Interval development of moderate bilateral pleural effusions.   Original Report Authenticated By: Gwynn Burly, M.D.     Review of Systems  Constitutional: Positive for fever and weight loss.    Respiratory: Negative for cough and shortness of breath.   Cardiovascular: Negative for chest pain.  Gastrointestinal: Positive for nausea and abdominal pain.  Neurological: Positive for weakness. Negative for headaches.    Blood pressure 123/74, pulse 89, temperature 99.1 F (37.3 C), temperature source Oral, resp. rate 18, height 5\' 6"  (1.676 m), weight 166 lb 10.7 oz (75.6 kg), last menstrual period 12/08/2006, SpO2 91.00%. Physical Exam  Constitutional: She is oriented to person, place, and time.  Cardiovascular: Normal rate, regular rhythm and normal heart sounds.   No murmur heard. Respiratory: Effort normal and breath sounds normal. She has no wheezes.  GI: Soft. Bowel sounds are normal. There is tenderness.  Musculoskeletal: Normal range of motion.  Neurological: She is alert and oriented to person, place, and time.  Psychiatric: She has a normal mood and affect. Her behavior is normal. Judgment and thought content normal.     Assessment/Plan Necrotic pancreatitis Debridement 9/15 with 2 drains Now new abd collection on CT Pt scheduled for drain placement in am Pt aware of procedure benefits and risks and agreeable to proceed Consent signed and in chart  Topanga Alvelo A 01/18/2012, 4:37 PM

## 2012-01-18 NOTE — Progress Notes (Signed)
Physical Therapy Treatment Patient Details Name: [name] MRN: [id number] DOB: [birth year] Today's Date: 01/18/2012 Time: 7829-5621 PT Time Calculation (min): 17 min  PT Assessment / Plan / Recommendation Comments on Treatment Session  Able to initiate ambulation this session. Pt fatigues easily. Encouraged OOB/sitting up in recliner-pt declined due to fatigue. If CIR is not an option, then recommend SNF    Follow Up Recommendations  Post acute inpatient rehab. SNF if CIR is not an option    Barriers to Discharge        Equipment Recommendations  Rolling walker with 5" wheels;3 in 1 bedside comode    Recommendations for Other Services OT consult  Frequency Min 3X/week   Plan Discharge plan remains appropriate    Precautions / Restrictions Precautions Precautions: Fall Precaution Comments: abdominal surgery, drains, wound vac   Pertinent Vitals/Pain Abdominal pain-unrated    Mobility  Bed Mobility Bed Mobility: Rolling Right Rolling Right: 5: Supervision Right Sidelying to Sit: 3: Mod assist Sit to Supine: 4: Min assist Details for Bed Mobility Assistance: Assist for trunk to upright and LEs onto bed. Increased time.  Transfers Transfers: Sit to Stand;Stand to Sit Sit to Stand: 1: +2 Total assist;From bed Sit to Stand: Patient Percentage: 70% Stand to Sit: 1: +2 Total assist;To bed Stand to Sit: Patient Percentage: 70% Details for Transfer Assistance: VCs safety, hand placement. Assist to rise, stabilize, control descent Ambulation/Gait Ambulation/Gait Assistance: 1: +2 Total assist Ambulation/Gait: Patient Percentage: 80% Assistive device: Rolling walker Ambulation/Gait Assistance Details: VCs safety. Assist to stabilize/support pt and maneuver with Rw, especially during turns. Fatigues easily.  Gait Pattern: Step-through pattern;Decreased stride length;Decreased step length - right;Decreased step length - left;Narrow base of support    Exercises General  Exercises - Lower Extremity Ankle Circles/Pumps: AROM;Both;10 reps;Supine Quad Sets: AROM;Both;10 reps;Supine Short Arc Quad: AROM;Both;10 reps;Supine Long Arc Quad: AROM;Both;10 reps;Supine Hip ABduction/ADduction: AROM;Both;10 reps;Supine   PT Diagnosis:    PT Problem List:   PT Treatment Interventions:     PT Goals    Visit Information  Last PT Received On: 01/18/12 Assistance Needed: +1    Subjective Data  Subjective: "I can't walk any distance" Patient Stated Goal: Home   Cognition  Overall Cognitive Status: Appears within functional limits for tasks assessed/performed Arousal/Alertness: Awake/alert Orientation Level: Appears intact for tasks assessed Behavior During Session: [name] for tasks performed    Balance     End of Session PT - End of Session Activity Tolerance: Patient limited by fatigue Patient left: in bed;with call bell/phone within reach   GP     Rebeca Alert [facility] 01/18/2012, 4:23 PM [phone]

## 2012-01-18 NOTE — Progress Notes (Addendum)
[facility] Progress Note  01/18/2012 Pt transferred from PCCM Service  Brief patient profile:  55 yowf adm by [facility] via [facility] on 09/07 with acute pancreatitis. H/O ERCP with sphincterotomy on 10/06/2011. On 9/12 RRT was called for respiratory decline and lethargy, subsequently required mechanical ventilation & Exploratory laparotomy 9/15 with debridement of necrotic pancreas and drainage.  Events Since Admission:  9/12 RRT and subsequent transfer to the ICU.  9/14 TFs held due to firm abdomen & sub glottic suctioning, WC higher  9/14 CT abd: increased ascites, Persistent edema/inflammation surrounding the head of the pancreas, with an approximate 1.8 x 2.6 cm pseudocyst at the junction of the head and body. Developing pseudocyst in the pancreatic head measuring approximately 1.3 x 2.6 cm, Large hiatal hernia.  9/14 doppler UE neg for DVT  9/15 1.5 L paracentesis - green brown fluid  9/15 Exploratory laparotomy with debridement of necrotic pancreas and drainage of the upper abdomen  9/18 EGD (Ganem) - 10-12 mm ulcer in the cardia of the stomach, protonix gtt, removed panda  9/20 CT abd: Decrease in moderate abdominal and pelvic ascites with intraperitoneal drainage catheters now seen in place. Mild increase in small bilateral pleural effusions. No significant change in diffuse body wall edema. Persistent small pseudocyst in the area the pancreatic head, which is decreased in size since earlier exam  9/23 extubated  9/24 dc fentanyl and start dilaudid.  9/24 change to SDU status.  9/26 transferred to [facility]   Subjective: Pt's foley has been discontinued and she is sitting in chair today.   No complaints today.    Objective:  Vital signs in last 24 hours: Filed Vitals:   01/17/12 0602 01/17/12 1448 01/17/12 2200 01/18/12 0600  BP: 111/73 127/56 131/76 123/74  Pulse: 83 85 93 89  Temp: 98.4 F (36.9 C) 98.4 F (36.9 C) 99.8 F (37.7 C) 99.1 F (37.3 C)  TempSrc: Oral Oral Oral Oral    Resp: 18 16 18 18   Height:      Weight:      SpO2: 90% 93% 93% 91%   Weight change:   Intake/Output Summary (Last 24 hours) at 01/18/12 1411 Last data filed at 01/18/12 0600  Gross per 24 hour  Intake     10 ml  Output   3500 ml  Net  -3490 ml   Lab Results  Component Value Date   HGBA1C 6.0* 11/27/2010   HGBA1C  Value: 5.5 (NOTE)                                                                       According to the ADA Clinical Practice Recommendations for 2011, when HbA1c is used as a screening test:   >=6.5%   Diagnostic of Diabetes Mellitus           (if abnormal result  is confirmed)  5.7-6.4%   Increased risk of developing Diabetes Mellitus  References:Diagnosis and Classification of Diabetes Mellitus,Diabetes Care,2011,34(Suppl 1):S62-S69 and Standards of Medical Care in         Diabetes - 2011,Diabetes Care,2011,34  (Suppl 1):S11-S61. 01/18/2010   HGBA1C  Value: 5.4 (NOTE)  According to the ADA Clinical Practice Recommendations for 2011, when HbA1c is used as a screening test:   >=6.5%   Diagnostic of Diabetes Mellitus           (if abnormal result  is confirmed)  5.7-6.4%   Increased risk of developing Diabetes Mellitus  References:Diagnosis and Classification of Diabetes Mellitus,Diabetes Care,2011,34(Suppl 1):S62-S69 and Standards of Medical Care in         Diabetes - 2011,Diabetes Care,2011,34  (Suppl 1):S11-S61. 09/15/2009   Lab Results  Component Value Date   LDLCALC 78 09/23/2011   CREATININE 0.50 01/18/2012    Review of Systems As above, otherwise all reviewed and reported negative  Physical Exam General: awake, alert, no distress, cooperative  HEENT: WNL  Cardiovascular: RRR s M.  Pulmonary/Chest: clear anteriorly, shallow BS, decreased bs bases  Abdominal: open wound improving in appearance, abd less distended, tender diffusely, active bowel sounds heard, Midline lap incision with 2 JP drains with  drainage  Musculoskeletal: 1-2+ edema and no tenderness.  Neurological: no focal deficits, reports less pain  Lab Results: Results for orders placed during the hospital encounter of 12/26/11 (from the past 24 hour(s))  GLUCOSE, CAPILLARY     Status: Abnormal   Collection Time   01/17/12  4:23 PM      Component Value Range   Glucose-Capillary 108 (*) 70 - 99 mg/dL  GLUCOSE, CAPILLARY     Status: Abnormal   Collection Time   01/17/12  8:47 PM      Component Value Range   Glucose-Capillary 110 (*) 70 - 99 mg/dL  GLUCOSE, CAPILLARY     Status: Abnormal   Collection Time   01/18/12 12:18 AM      Component Value Range   Glucose-Capillary 151 (*) 70 - 99 mg/dL  GLUCOSE, CAPILLARY     Status: Abnormal   Collection Time   01/18/12  4:20 AM      Component Value Range   Glucose-Capillary 111 (*) 70 - 99 mg/dL  COMPREHENSIVE METABOLIC PANEL     Status: Abnormal   Collection Time   01/18/12  5:10 AM      Component Value Range   Sodium 134 (*) 135 - 145 mEq/L   Potassium 3.5  3.5 - 5.1 mEq/L   Chloride 103  96 - 112 mEq/L   CO2 22  19 - 32 mEq/L   Glucose, Bld 116 (*) 70 - 99 mg/dL   BUN 12  6 - 23 mg/dL   Creatinine, Ser 4.09  0.50 - 1.10 mg/dL   Calcium 8.1 (*) 8.4 - 10.5 mg/dL   Total Protein 6.3  6.0 - 8.3 g/dL   Albumin 1.6 (*) 3.5 - 5.2 g/dL   AST 28  0 - 37 U/L   ALT 17  0 - 35 U/L   Alkaline Phosphatase 101  39 - 117 U/L   Total Bilirubin 0.4  0.3 - 1.2 mg/dL   GFR calc non Af Amer >90  >90 mL/min   GFR calc Af Amer >90  >90 mL/min  MAGNESIUM     Status: Normal   Collection Time   01/18/12  5:10 AM      Component Value Range   Magnesium 1.8  1.5 - 2.5 mg/dL  PHOSPHORUS     Status: Normal   Collection Time   01/18/12  5:10 AM      Component Value Range   Phosphorus 3.6  2.3 - 4.6 mg/dL  CBC     Status:  Abnormal   Collection Time   01/18/12  5:10 AM      Component Value Range   WBC 15.7 (*) 4.0 - 10.5 K/uL   RBC 2.62 (*) 3.87 - 5.11 MIL/uL   Hemoglobin 7.5 (*) 12.0 -  15.0 g/dL   HCT 16.1 (*) 09.6 - 04.5 %   MCV 89.7  78.0 - 100.0 fL   MCH 28.6  26.0 - 34.0 pg   MCHC 31.9  30.0 - 36.0 g/dL   RDW 40.9 (*) 81.1 - 91.4 %   Platelets 636 (*) 150 - 400 K/uL  DIFFERENTIAL     Status: Abnormal   Collection Time   01/18/12  5:10 AM      Component Value Range   Neutrophils Relative 75  43 - 77 %   Lymphocytes Relative 13  12 - 46 %   Monocytes Relative 9  3 - 12 %   Eosinophils Relative 2  0 - 5 %   Basophils Relative 1  0 - 1 %   Neutro Abs 11.8 (*) 1.7 - 7.7 K/uL   Lymphs Abs 2.0  0.7 - 4.0 K/uL   Monocytes Absolute 1.4 (*) 0.1 - 1.0 K/uL   Eosinophils Absolute 0.3  0.0 - 0.7 K/uL   Basophils Absolute 0.2 (*) 0.0 - 0.1 K/uL   RBC Morphology POLYCHROMASIA PRESENT     WBC Morphology MILD LEFT SHIFT (1-5% METAS, OCC MYELO, OCC BANDS)    CHOLESTEROL, TOTAL     Status: Normal   Collection Time   01/18/12  5:10 AM      Component Value Range   Cholesterol 83  0 - 200 mg/dL  TRIGLYCERIDES     Status: Normal   Collection Time   01/18/12  5:10 AM      Component Value Range   Triglycerides 130  <150 mg/dL  PREALBUMIN     Status: Abnormal   Collection Time   01/18/12  5:10 AM      Component Value Range   Prealbumin 11.4 (*) 17.0 - 34.0 mg/dL  GLUCOSE, CAPILLARY     Status: Abnormal   Collection Time   01/18/12 12:41 PM      Component Value Range   Glucose-Capillary 114 (*) 70 - 99 mg/dL    Micro Results: No results found for this or any previous visit (from the past 240 hour(s)).  Medications:  Scheduled Meds:   . antiseptic oral rinse  15 mL Mouth Rinse QID  . chlorhexidine  15 mL Mouth Rinse BID  . enoxaparin (LOVENOX) injection  30 mg Subcutaneous Q24H  . folic acid  1 mg Intravenous Daily  . insulin aspart  2-6 Units Subcutaneous Q6H  . LORazepam  1 mg Intravenous Q8H  . metronidazole  500 mg Intravenous Q8H  . octreotide  100 mcg Subcutaneous Q8H  . pantoprazole (PROTONIX) IV  40 mg Intravenous Q12H  . piperacillin-tazobactam (ZOSYN)  IV   3.375 g Intravenous Q8H  . DISCONTD: insulin aspart  2-6 Units Subcutaneous Q4H   Continuous Infusions:   . sodium chloride 20 mL/hr (01/13/12 0042)  . TPN (CLINIMIX) +/- additives     And  . fat emulsion    . TPN (CLINIMIX) +/- additives 80 mL/hr at 01/16/12 1724  . TPN (CLINIMIX) +/- additives 80 mL/hr at 01/17/12 1658   PRN Meds:.acetaminophen, HYDROmorphone (DILAUDID) injection, iohexol, menthol-cetylpyridinium, ondansetron (ZOFRAN) IV, polyvinyl alcohol, sodium chloride  Assessment/Plan: Ruptured pancreatic pseudocyst with peritonitis  -s/p Exploratory laparotomy with debridement of  necrotic pancreas and drainage of the upper abdomen 01/03/12 Dr. Wenda Low.  -Post op drainage from pseudocyst  -Post op VDRF on Vent, extubated 01/11/12  Day 15 flagyl  Day 19 Zosyn  - continue nutrition mgmt per surgery  - pt tolerating sips of clears now  - pain meds IV (mgmt per [facility])  Anemia  - s/p 1 unit PRBC  - following CBC  - Hg about 7.5   Gastric Ulcer  - continue BID PPI treatment   Deconditioning/Weakness/Immobility  - CIR consult appreciated, pt may be candidate for inpatient rehab  - [facility] team is increasing ambulation  - SCDs for DVT prophylaxis  - encouraged more ambulation  C. Diff colitis  - continue flagyl IV for now  - enteral flagyl or oral vanc when tolerating p.o.  - repeat c.diff test ordered 9/29 still pending  Septic Shock  - resolved now   Chronic Narcotic Dependence  - IV dilaudid for pain  - pain mgmt per surgery   ARDS / Acute Resp Failure  - pt off vent since 9/23  -monitoring closely post extubation   Hx of pancreatitis, and alcohol abuse.   Hyponatremia  - volume overloaded  - monitoring closely  - BMP in AM  - slightly improved  GERD  - continue PPI every 12 hours (pt has a gastric ulcer)   Elevated liver function tests  - LFTs have returned to normal  Hiatal Hernia  9/18 EGD (Ganem) - 10-12 mm ulcer in the cardia of the stomach,  protonix gtt, removed panda    LOS: 23 days   Lanisha Stepanian 01/18/2012, 2:11 PM  Cleora Fleet, MD, CDE, FAAFP [facility] [place], [place]  [phone]

## 2012-01-18 NOTE — Progress Notes (Signed)
SEEMS WELL DRAINED WILL CHECK CT

## 2012-01-18 NOTE — Progress Notes (Signed)
Nutrition Follow-up  Intervention: TPN per pharmacy. Diet advancement per MD. Recommend MD discuss with pt possible medications for bloating and gas pain as pt reports this has been an ongoing issue for her. Pt's weight up 15 pounds since admission, noted pt with edema. Will monitor.   TPN: Clinimix E 5/15 @ 80 ml/hr.  Lipids (20% IVFE @ 10 ml/hr), multivitamins, and trace elements are provided 3 times weekly (MWF) due to national backorder.  Provides 1569 kcal and 96 grams protein daily (based on weekly average).  Meets 106% minimum estimated kcal and 102% minimum estimated protein needs.  Additional IVF with NS @ 10-20 ml/hr.  Diet Order: NPO except ice chips, sips with medications   - Pt reports tolerating ice chips well, taking it slow, denies any nausea. Pt's main c/o is bloating and on/off gas pain. Pt reports having BM in the bed yesterday. Pt with mild pitting generalized edema, non-pitting RUE and LUE edema, and mild pitting RLE and LLE edema.   - Noted pt with persistent low sodium - PALB relatively stable compared to last week - Lipase trending up - CBGs well controlled  Meds: Scheduled Meds:   . antiseptic oral rinse  15 mL Mouth Rinse QID  . chlorhexidine  15 mL Mouth Rinse BID  . enoxaparin (LOVENOX) injection  40 mg Subcutaneous Q24H  . folic acid  1 mg Intravenous Daily  . insulin aspart  2-6 Units Subcutaneous Q6H  . LORazepam  1 mg Intravenous Q8H  . metronidazole  500 mg Intravenous Q8H  . octreotide  100 mcg Subcutaneous Q8H  . pantoprazole (PROTONIX) IV  40 mg Intravenous Q12H  . piperacillin-tazobactam (ZOSYN)  IV  3.375 g Intravenous Q8H  . DISCONTD: enoxaparin (LOVENOX) injection  30 mg Subcutaneous Q24H  . DISCONTD: insulin aspart  2-6 Units Subcutaneous Q4H   Continuous Infusions:   . sodium chloride 20 mL/hr (01/13/12 0042)  . TPN (CLINIMIX) +/- additives     And  . fat emulsion    . TPN (CLINIMIX) +/- additives 80 mL/hr at 01/16/12 1724  . TPN  (CLINIMIX) +/- additives 80 mL/hr at 01/17/12 1658   PRN Meds:.acetaminophen, HYDROmorphone (DILAUDID) injection, iohexol, menthol-cetylpyridinium, ondansetron (ZOFRAN) IV, polyvinyl alcohol, sodium chloride  Labs:  CMP     Component Value Date/Time   NA 134* 01/18/2012 0510   K 3.5 01/18/2012 0510   CL 103 01/18/2012 0510   CO2 22 01/18/2012 0510   GLUCOSE 116* 01/18/2012 0510   BUN 12 01/18/2012 0510   CREATININE 0.50 01/18/2012 0510   CALCIUM 8.1* 01/18/2012 0510   PROT 6.3 01/18/2012 0510   ALBUMIN 1.6* 01/18/2012 0510   AST 28 01/18/2012 0510   ALT 17 01/18/2012 0510   ALKPHOS 101 01/18/2012 0510   BILITOT 0.4 01/18/2012 0510   GFRNONAA >90 01/18/2012 0510   GFRAA >90 01/18/2012 0510   Lipase  Date Value Range Status  01/11/2012 113* 11 - 59 U/L Final   Prealbumin  Date Value Range Status  01/18/2012 11.4* 17.0 - 34.0 mg/dL Final   CBG (last 3)   Basename 01/18/12 1241 01/18/12 0420 01/18/12 0018  GLUCAP 114* 111* 151*      Intake/Output Summary (Last 24 hours) at 01/18/12 1428 Last data filed at 01/18/12 1400  Gross per 24 hour  Intake     10 ml  Output   3525 ml  Net  -3515 ml   Last BM - 9/30  Weight Status:   9/23 173 lb 11.6 oz  9/24 166 lb 10.7 oz  Estimated needs:   1475-1775 calories 94-110g protein  Nutrition Dx: Inadequate oral intake now r/t severe pancreatitis AEB NPO.   Goal:  1. Diet advancement - not met 2. TPN to meet >90% of estimated energy needs - met 3. Promote weight maintenance - met, pt with weight gain since admission, however likely mostly fluid related  Monitor: Weights, labs, diet advancement, BM, TPN, lipase   Levon Hedger MS, RD, LDN [phone] Pager [phone] After Hours Pager

## 2012-01-19 ENCOUNTER — Inpatient Hospital Stay (HOSPITAL_COMMUNITY): Payer: MEDICAID

## 2012-01-19 DIAGNOSIS — E46 Unspecified protein-calorie malnutrition: Secondary | ICD-10-CM

## 2012-01-19 LAB — COMPREHENSIVE METABOLIC PANEL
BUN: 13 mg/dL (ref 6–23)
CO2: 22 mEq/L (ref 19–32)
Calcium: 8.2 mg/dL — ABNORMAL LOW (ref 8.4–10.5)
GFR calc Af Amer: >90 mL/min (ref >90)
GFR calc non Af Amer: >90 mL/min (ref >90)
Glucose, Bld: 108 mg/dL — ABNORMAL HIGH (ref 70–99)
Total Protein: 6.8 g/dL (ref 6.0–8.3)

## 2012-01-19 LAB — CBC
HCT: 24.4 % — ABNORMAL LOW (ref 36.0–46.0)
Hemoglobin: 7.7 g/dL — ABNORMAL LOW (ref 12.0–15.0)
MCHC: 31.6 g/dL (ref 30.0–36.0)
RDW: 18 % — ABNORMAL HIGH (ref 11.5–15.5)
WBC: 16.8 10*3/uL — ABNORMAL HIGH (ref 4.0–10.5)

## 2012-01-19 LAB — GLUCOSE, CAPILLARY
Glucose-Capillary: 128 mg/dL — ABNORMAL HIGH (ref 70–99)
Glucose-Capillary: 137 mg/dL — ABNORMAL HIGH (ref 70–99)

## 2012-01-19 MED ORDER — CLINIMIX E/DEXTROSE (5/15) 5 % IV SOLN
INTRAVENOUS | Status: AC
  Administered 2012-01-19: 17:00:00 via INTRAVENOUS
  Filled 2012-01-19: qty 2000

## 2012-01-19 MED ORDER — MIDAZOLAM HCL 5 MG/5ML IJ SOLN
INTRAMUSCULAR | Status: AC | PRN
  Administered 2012-01-19: 2 mg via INTRAVENOUS

## 2012-01-19 MED ORDER — FENTANYL CITRATE 0.05 MG/ML IJ SOLN
INTRAMUSCULAR | Status: AC | PRN
  Administered 2012-01-19: 100 ug via INTRAVENOUS

## 2012-01-19 NOTE — ED Notes (Signed)
STEWARD SCALE 6 

## 2012-01-19 NOTE — ED Notes (Signed)
STEWARD SCALE 5 

## 2012-01-19 NOTE — Progress Notes (Signed)
WILL NEED ADDITIONAL PERC DRAIN

## 2012-01-19 NOTE — Procedures (Signed)
Interventional Radiology Procedure Note  Procedure: Successful placement of a 25 cm long, 15F Cook APD modified with additional sideholes so that ~20cm of the tube has holes for drainage.  Approximately 300 mL purulent fluid aspirated and sent for cx.  Drain connected to gravity Complications: None Recommendations: - Flush tube at least once per shift with 10 mL saline and re-connect to gravity drain - Record output - Follow cx  Signed,  Sterling Big, MD Vascular & Interventional Radiologist [name] Radiology

## 2012-01-19 NOTE — Progress Notes (Signed)
Occupational Therapy Note See pt reporting pain this am and also scheduled for procedure later. Will also check back for OT at a later time. Judithann Sauger OTR/L [phone] 01/19/2012

## 2012-01-19 NOTE — ED Notes (Signed)
Steward scale =6 

## 2012-01-19 NOTE — Progress Notes (Signed)
PT Cancellation Note  Treatment cancelled today due to medical issues with patient which prohibited therapy-pt c/o pain (notified RN)-requested PT check back another time. Pt is also scheduled for procedure sometime today. Will check back later today or tomorrow. Thanks.  Rebeca Alert [facility] 01/19/2012, 9:55 AM [phone]

## 2012-01-19 NOTE — Progress Notes (Signed)
13 Days Post-Op  Subjective: No real change, complains of being sore.  Asking about ice chips.  Objective: Vital signs in last 24 hours: Temp:  [98.2 F (36.8 C)-99.3 F (37.4 C)] 98.2 F (36.8 C) (10/01 0551) Pulse Rate:  [89-94] 94  (10/01 0551) Resp:  [18] 18  (10/01 0551) BP: (103-125)/(52-71) 103/52 mmHg (10/01 0551) SpO2:  [94 %-97 %] 94 % (10/01 0551) Last BM Date: 01/18/12  She is having bowel movements, NPO on TNA, afebrile, Tm 99.8, VSS. C diff still pending. Intake/Output from previous day: 09/30 0701 - 10/01 0700 In: -  Out: 1900 [Urine:1750; Drains:150] Intake/Output this shift:    General appearance: alert, cooperative and no distress Resp: clear to auscultation bilaterally and BS down in bases. GI: soft, mildly tender, few BS. Drains about the same  Lab Results:   Basename 01/19/12 0451 01/18/12 0510  WBC 16.8* 15.7*  HGB 7.7* 7.5*  HCT 24.4* 23.5*  PLT 623* 636*    BMET  Basename 01/19/12 0451 01/18/12 0510  NA 132* 134*  K 3.6 3.5  CL 101 103  CO2 22 22  GLUCOSE 108* 116*  BUN 13 12  CREATININE 0.49* 0.50  CALCIUM 8.2* 8.1*   PT/INR No results found for this basename: LABPROT:2,INR:2 in the last 72 hours   Lab 01/19/12 0451 01/18/12 0510 01/15/12 0508 01/14/12 0500  AST 29 28 28 29   ALT 16 17 27  35  ALKPHOS 96 101 84 96  BILITOT 0.4 0.4 0.3 0.2*  PROT 6.8 6.3 5.8* 6.0  ALBUMIN 1.8* 1.6* 1.4* 1.5*     Lipase     Component Value Date/Time   LIPASE 113* 01/11/2012 0540     Studies/Results: Ct Abdomen Pelvis W Contrast  01/18/2012  *RADIOLOGY REPORT*  Clinical Data: Acute pancreatitis.  Epigastric pain with fever. Pancreatic pseudocyst.  CT ABDOMEN AND PELVIS WITH CONTRAST  Technique:  Multidetector CT imaging of the abdomen and pelvis was performed following the standard protocol during bolus administration of intravenous contrast.  Contrast: OMNIPAQUE IOHEXOL 300 MG/ML  SOLN  Comparison: CT scan dated 01/02/2012  Findings: The  patient has significantly increased moderate bilateral pleural effusions with secondary atelectasis at the lung bases.  Heart size is normal. No change in the large hiatal hernia.  Two pseudocyst drainage catheters are present in the upper abdomen. There has been slight decrease in the fluid along the left pericolic gutter.  The patient now has increased enhancement of the margins of the loculated fluid in the abdomen and pelvis.  There is a fluid collection which extends from the overlying the top of the right lobe of the liver down the right pericolic gutter into the pelvis.  This collection measures 35 x 17 x 5.3 cm.  The drainage catheter in the left upper quadrant is within the left upper quadrant recess at this fluid collection that I suspect that the fluid with draining pattern with a midline pelvic catheter.  There is a small amount of residual pancreatic fluid around the mid abdominal catheter.  This has decreased.  The liver, pancreas, adrenal glands, and kidneys are normal and stable.  Small pancreatic pseudocyst in the head of the pancreas is better defined measures 16 mm.  No dilated bowel.  Uterus and ovaries are normal.  No acute osseous abnormality.  IMPRESSION:  1.  The patient has developed an enhancing rim around the extensive loculated fluid in both sides of the abdomen consistent with a pseudocyst.  The most prominent portion  of the collection is in the right side of the abdomen and the pelvis.  The draining in the left upper quadrant is within the recess at this collection but I suspect another drain in the pelvis might be useful. 2.  Slightly better definition of the small pseudocyst in the head of the pancreas. 3.  Interval development of moderate bilateral pleural effusions.   Original Report Authenticated By: Gwynn Burly, M.D.     Medications:    . antiseptic oral rinse  15 mL Mouth Rinse QID  . chlorhexidine  15 mL Mouth Rinse BID  . enoxaparin (LOVENOX) injection  40 mg  Subcutaneous Q24H  . folic acid  1 mg Intravenous Daily  . insulin aspart  2-6 Units Subcutaneous Q6H  . LORazepam  1 mg Intravenous Q8H  . metronidazole  500 mg Intravenous Q8H  . octreotide  100 mcg Subcutaneous Q8H  . pantoprazole (PROTONIX) IV  40 mg Intravenous Q12H  . piperacillin-tazobactam (ZOSYN)  IV  3.375 g Intravenous Q8H  . DISCONTD: enoxaparin (LOVENOX) injection  30 mg Subcutaneous Q24H  . DISCONTD: enoxaparin (LOVENOX) injection  40 mg Subcutaneous Q24H    Assessment/Plan Ruptured pseudocyst with peritonitis, s/p Exploratory laparotomy with debridement of necrotic pancreas and drainage of the upper abdomen 01/03/12 Dr. Wenda Low.  Post op drainage from pseudocyst  WBC still up with intraabdominal fluid collection on CT 01/18/12, for IR drain 01/19/12. Post op VDRF on Vent, extubated 01/11/12  Anemia H/H= 7.3/22.3  Hx of pancreatitis, and alcohol abuse.  GERD  Elevated liver function tests  GERD (gastroesophageal reflux disease)  Hiatal Hernia  9/18 EGD (Ganem) - 10-12 mm ulcer in the cardia of the stomach, protonix gtt, removed panda  Day 16 flagyl, started 9/15  Day 20 Zosyn, started 9/11  Plan:  Ir drain today, continue current RX.      LOS: 24 days    [name] 01/19/2012

## 2012-01-19 NOTE — Progress Notes (Signed)
Drain today.  Continue supportive care

## 2012-01-19 NOTE — Progress Notes (Signed)
[facility] Progress Note  01/19/2012  Pt transferred from PCCM Service  Brief patient profile:  55 yowf adm by [facility] via [facility] on 09/07 with acute pancreatitis. H/O ERCP with sphincterotomy on 10/06/2011. On 9/12 RRT was called for respiratory decline and lethargy, subsequently required mechanical ventilation & Exploratory laparotomy 9/15 with debridement of necrotic pancreas and drainage.  Events Since Admission:  9/12 RRT and subsequent transfer to the ICU.  9/14 TFs held due to firm abdomen & sub glottic suctioning, WC higher  9/14 CT abd: increased ascites, Persistent edema/inflammation surrounding the head of the pancreas, with an approximate 1.8 x 2.6 cm pseudocyst at the junction of the head and body. Developing pseudocyst in the pancreatic head measuring approximately 1.3 x 2.6 cm, Large hiatal hernia.  9/14 doppler UE neg for DVT  9/15 1.5 L paracentesis - green brown fluid  9/15 Exploratory laparotomy with debridement of necrotic pancreas and drainage of the upper abdomen  9/18 EGD (Ganem) - 10-12 mm ulcer in the cardia of the stomach, protonix gtt, removed panda  9/20 CT abd: Decrease in moderate abdominal and pelvic ascites with intraperitoneal drainage catheters now seen in place. Mild increase in small bilateral pleural effusions. No significant change in diffuse body wall edema. Persistent small pseudocyst in the area the pancreatic head, which is decreased in size since earlier exam  9/23 extubated  9/24 dc fentanyl and start dilaudid.  9/24 change to SDU status.  9/26 transferred to [facility]  10/1 Additional Drain Placed by IR  Subjective: Pt to go to IR for drain placement this morning.    Objective:  Vital signs in last 24 hours: Filed Vitals:   01/18/12 0600 01/18/12 1400 01/18/12 2230 01/19/12 0551  BP: 123/74 125/71 112/62 103/52  Pulse: 89 92 89 94  Temp: 99.1 F (37.3 C) 98.9 F (37.2 C) 99.3 F (37.4 C) 98.2 F (36.8 C)  TempSrc: Oral Oral Oral   Resp: 18  18 18 18   Height:      Weight:      SpO2: 91% 95% 97% 94%   Weight change:   Intake/Output Summary (Last 24 hours) at 01/19/12 1610 Last data filed at 01/18/12 1818  Gross per 24 hour  Intake      0 ml  Output   1900 ml  Net  -1900 ml   Lab Results  Component Value Date   HGBA1C 6.0* 11/27/2010   HGBA1C  Value: 5.5 (NOTE)                                                                       According to the ADA Clinical Practice Recommendations for 2011, when HbA1c is used as a screening test:   >=6.5%   Diagnostic of Diabetes Mellitus           (if abnormal result  is confirmed)  5.7-6.4%   Increased risk of developing Diabetes Mellitus  References:Diagnosis and Classification of Diabetes Mellitus,Diabetes Care,2011,34(Suppl 1):S62-S69 and Standards of Medical Care in         Diabetes - 2011,Diabetes Care,2011,34  (Suppl 1):S11-S61. 01/18/2010   HGBA1C  Value: 5.4 (NOTE)  According to the ADA Clinical Practice Recommendations for 2011, when HbA1c is used as a screening test:   >=6.5%   Diagnostic of Diabetes Mellitus           (if abnormal result  is confirmed)  5.7-6.4%   Increased risk of developing Diabetes Mellitus  References:Diagnosis and Classification of Diabetes Mellitus,Diabetes Care,2011,34(Suppl 1):S62-S69 and Standards of Medical Care in         Diabetes - 2011,Diabetes Care,2011,34  (Suppl 1):S11-S61. 09/15/2009   Lab Results  Component Value Date   LDLCALC 78 09/23/2011   CREATININE 0.49* 01/19/2012    Review of Systems As above, otherwise all reviewed and reported negative  Physical Exam General: awake, alert, no distress, cooperative  HEENT: NCAT, MMM   Cardiovascular: RRR s M.  Pulmonary/Chest: clear anteriorly, shallow BS, decreased bs bases  Abdominal: open wound improving in appearance, abd less distended, still tender diffusely, active bowel sounds heard, Midline lap incision with 2 JP drains with  drainage  Musculoskeletal: 1-2+ edema and no tenderness.  Neurological: no focal deficits, reports less pain  Lab Results: Results for orders placed during the hospital encounter of 12/26/11 (from the past 24 hour(s))  GLUCOSE, CAPILLARY     Status: Abnormal   Collection Time   01/18/12 12:41 PM      Component Value Range   Glucose-Capillary 114 (*) 70 - 99 mg/dL  GLUCOSE, CAPILLARY     Status: Abnormal   Collection Time   01/18/12  6:29 PM      Component Value Range   Glucose-Capillary 119 (*) 70 - 99 mg/dL  GLUCOSE, CAPILLARY     Status: Abnormal   Collection Time   01/19/12 12:35 AM      Component Value Range   Glucose-Capillary 137 (*) 70 - 99 mg/dL   Comment 1 Notify RN    COMPREHENSIVE METABOLIC PANEL     Status: Abnormal   Collection Time   01/19/12  4:51 AM      Component Value Range   Sodium 132 (*) 135 - 145 mEq/L   Potassium 3.6  3.5 - 5.1 mEq/L   Chloride 101  96 - 112 mEq/L   CO2 22  19 - 32 mEq/L   Glucose, Bld 108 (*) 70 - 99 mg/dL   BUN 13  6 - 23 mg/dL   Creatinine, Ser 1.61 (*) 0.50 - 1.10 mg/dL   Calcium 8.2 (*) 8.4 - 10.5 mg/dL   Total Protein 6.8  6.0 - 8.3 g/dL   Albumin 1.8 (*) 3.5 - 5.2 g/dL   AST 29  0 - 37 U/L   ALT 16  0 - 35 U/L   Alkaline Phosphatase 96  39 - 117 U/L   Total Bilirubin 0.4  0.3 - 1.2 mg/dL   GFR calc non Af Amer >90  >90 mL/min   GFR calc Af Amer >90  >90 mL/min  CBC     Status: Abnormal   Collection Time   01/19/12  4:51 AM      Component Value Range   WBC 16.8 (*) 4.0 - 10.5 K/uL   RBC 2.72 (*) 3.87 - 5.11 MIL/uL   Hemoglobin 7.7 (*) 12.0 - 15.0 g/dL   HCT 09.6 (*) 04.5 - 40.9 %   MCV 89.7  78.0 - 100.0 fL   MCH 28.3  26.0 - 34.0 pg   MCHC 31.6  30.0 - 36.0 g/dL   RDW 81.1 (*) 91.4 - 78.2 %   Platelets 623 (*)  150 - 400 K/uL  GLUCOSE, CAPILLARY     Status: Abnormal   Collection Time   01/19/12  5:33 AM      Component Value Range   Glucose-Capillary 107 (*) 70 - 99 mg/dL    Micro Results: No results found for this  or any previous visit (from the past 240 hour(s)).  Medications:  Scheduled Meds:   . antiseptic oral rinse  15 mL Mouth Rinse QID  . chlorhexidine  15 mL Mouth Rinse BID  . enoxaparin (LOVENOX) injection  40 mg Subcutaneous Q24H  . folic acid  1 mg Intravenous Daily  . insulin aspart  2-6 Units Subcutaneous Q6H  . LORazepam  1 mg Intravenous Q8H  . metronidazole  500 mg Intravenous Q8H  . octreotide  100 mcg Subcutaneous Q8H  . pantoprazole (PROTONIX) IV  40 mg Intravenous Q12H  . piperacillin-tazobactam (ZOSYN)  IV  3.375 g Intravenous Q8H  . DISCONTD: enoxaparin (LOVENOX) injection  30 mg Subcutaneous Q24H  . DISCONTD: enoxaparin (LOVENOX) injection  40 mg Subcutaneous Q24H   Continuous Infusions:   . sodium chloride 20 mL/hr (01/13/12 0042)  . TPN (CLINIMIX) +/- additives 80 mL/hr at 01/18/12 1748   And  . fat emulsion 250 mL (01/18/12 1748)  . TPN (CLINIMIX) +/- additives 80 mL/hr at 01/17/12 1658  . TPN (CLINIMIX) +/- additives     PRN Meds:.acetaminophen, HYDROmorphone (DILAUDID) injection, iohexol, menthol-cetylpyridinium, ondansetron (ZOFRAN) IV, polyvinyl alcohol, sodium chloride  Assessment/Plan: Ruptured pancreatic pseudocyst with peritonitis  -s/p Exploratory laparotomy with debridement of necrotic pancreas and drainage of the upper abdomen 01/03/12 Dr. Wenda Low.  -Post op drainage from pseudocyst  -Post op VDRF on Vent, extubated 01/11/12  Day 15 flagyl  Day 19 Zosyn  - continue nutrition mgmt per surgery  - pt tolerating sips of clears now  - pain meds IV (mgmt per CCS)  - CT abdomen 9/30: new collection in abdomen - pt to go to IR for new drain placement this morning  Anemia  - s/p 1 unit PRBC 9/28  - following CBC  - Hg holding stable about 7.5   Gastric Ulcer  - continue BID PPI treatment   Deconditioning/Weakness/Immobility  - CIR consult appreciated, pt may be candidate for inpatient rehab  - [facility] team is increasing ambulation  - SCDs for DVT  prophylaxis  - encouraged more ambulation   C. Diff colitis  - continue flagyl IV for now  - enteral flagyl or oral vanc when tolerating p.o.  - repeat c.diff test ordered 9/29 still pending   Septic Shock  - resolved now   Chronic Narcotic Dependence  - IV dilaudid for pain  - pain mgmt per surgery   ARDS / Acute Resp Failure  - pt off vent since 9/23  -monitoring closely post extubation   Hx of pancreatitis, and alcohol abuse  Hyponatremia  - volume overloaded  - monitoring closely  - BMP in AM  - slightly improved   GERD  - continue PPI every 12 hours (pt has a gastric ulcer)   Elevated liver function tests  - LFTs have returned to normal   Hiatal Hernia  9/18 EGD ([facility]em) - 10-12 mm ulcer in the cardia of the stomach, protonix gtt, removed panda    LOS: 24 days   Isaak Delmundo 01/19/2012, 8:07 AM  Cleora Fleet, MD, CDE, FAAFP [name] [place], [place]  [phone]

## 2012-01-19 NOTE — Progress Notes (Signed)
PARENTERAL NUTRITION CONSULT NOTE - FOLLOW UP  Pharmacy Consult for TNA Indication: Severe Pancreatitis  Allergies  Allergen Reactions  . Ciprofloxacin Nausea Only    REACTION: nausea   Patient Measurements: Height: 5\' 6"  (167.6 cm) Weight: 166 lb 10.7 oz (75.6 kg) IBW/kg (Calculated) : 59.3   Vital Signs: Temp: 98.2 F (36.8 C) (10/01 0551) Temp src: Oral (09/30 2230) BP: 103/52 mmHg (10/01 0551) Pulse Rate: 94  (10/01 0551) Intake/Output from previous day: 09/30 0701 - 10/01 0700 In: -  Out: 1900 [Urine:1750; Drains:150] Intake/Output from this shift:    Labs:  Basename 01/19/12 0451 01/18/12 0510 01/17/12 0500  WBC 16.8* 15.7* 15.2*  HGB 7.7* 7.5* 7.8*  HCT 24.4* 23.5* 24.0*  PLT 623* 636* 637*  APTT -- -- --  INR -- -- --    Basename 01/19/12 0451 01/18/12 0510 01/17/12 0500  NA 132* 134* 132*  K 3.6 3.5 3.5  CL 101 103 101  CO2 22 22 23   GLUCOSE 108* 116* 127*  BUN 13 12 12   CREATININE 0.49* 0.50 0.49*  LABCREA -- -- --  CREAT24HRUR -- -- --  CALCIUM 8.2* 8.1* 7.7*  MG -- 1.8 --  PHOS -- 3.6 --  PROT 6.8 6.3 --  ALBUMIN 1.8* 1.6* --  AST 29 28 --  ALT 16 17 --  ALKPHOS 96 101 --  BILITOT 0.4 0.4 --  BILIDIR -- -- --  IBILI -- -- --  PREALBUMIN -- 11.4* --  TRIG -- 130 --  CHOLHDL -- -- --  CHOL -- 83 --   Estimated Creatinine Clearance: 81.6 ml/min (by C-G formula based on Cr of 0.49).    Basename 01/19/12 0533 01/19/12 0035 01/18/12 1829  GLUCAP 107* 137* 119*   Insulin Requirements in the past 24 hours:   CBGs <150, required 0 units of SSI last 24  Nutritional Goals:   RD recs 9/16: 1762 kcal, 86-95 grams protein  Reestimated s/p extubation 9/23: 1475-1775 kcal, 94-110 grams protein  Clinimix 5/15 @ 24ml/hr + Lipid 20% MWF will provide 96 g protein/day, 1843 kCal on lipid days, 1363 Kcal on non-lipid days for an average of 1569 Kcal per day/week.  Current nutrition:   Diet: NPO except ice chips  IVF: NS @ KVO  Clinimix E  5/15 @ 80 ml/hr  Assessment:   [name] admitted 9/8 with epigastric abd pain, fever, chills and N/V. Pt with h/o recurrent pancreatitis and ETOH abuse, s/p ERCP with sphincterotomy on 10/06/2011. Dx with acute pancreatitis this admit, developed ileus.   Rapid response called 9/12 for respiratory decline and lethargy - pt transfer to ICU and CCM consulted. Intubated 9/12. 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   Clinical deterioration with rising WBC and findings suggesting GI perforation, underwent exploratory laparotomy with debridement of necrotic pancreas and drainage of infected, ruptured pseudocyst 9/15. TNA started 9/16.   9/30 CT abd = extensive loculated fluid in both sides of the abdomen consistent with a pseudocyst, IR to place 3rd drain 10/1  Renal: Scr wnl, I/O = incomplete, good UOP, drains Hepatic fxn: AST/ALT previously elevated ?shock liver 2/2 pancreatitis. Now WNL Lytes: Na+ slightly low but unable to adjust in TNA, Ca = 8.2 but 9.96 when corrected for albumin Pre-Alb: improving, 2.3 (9/17), 12.2 (9/23), 11.4 (9/30) TG/Cholesterol: WNL on  9/17, 9/23, 9/30 CBGs: At goal <150  Plan:    Continue clinimix to E 5/15 at 80  ml/hr, latest prealbumin reveals maintaining protein stores (level remains mod decreased)  PO advancement per CCS, started on sips/chips 9/29  TNA to contain IV fat emulsion, standard multivitamins and trace elements only on MWF only due to ongoing shortage   TNA labs Monday/Thursdays  Juliette Alcide, PharmD, BCPS.   Pager: [phone] 01/19/2012 7:11 AM

## 2012-01-20 DIAGNOSIS — K259 Gastric ulcer, unspecified as acute or chronic, without hemorrhage or perforation: Secondary | ICD-10-CM

## 2012-01-20 DIAGNOSIS — R5381 Other malaise: Secondary | ICD-10-CM

## 2012-01-20 HISTORY — DX: Gastric ulcer, unspecified as acute or chronic, without hemorrhage or perforation: K25.9

## 2012-01-20 LAB — GLUCOSE, CAPILLARY
Glucose-Capillary: 111 mg/dL — ABNORMAL HIGH (ref 70–99)
Glucose-Capillary: 117 mg/dL — ABNORMAL HIGH (ref 70–99)
Glucose-Capillary: 130 mg/dL — ABNORMAL HIGH (ref 70–99)
Glucose-Capillary: 160 mg/dL — ABNORMAL HIGH (ref 70–99)

## 2012-01-20 LAB — CBC
HCT: 23 % — ABNORMAL LOW (ref 36.0–46.0)
Hemoglobin: 7.2 g/dL — ABNORMAL LOW (ref 12.0–15.0)
MCH: 28.2 pg (ref 26.0–34.0)
MCV: 90.2 fL (ref 78.0–100.0)
Platelets: 538 10*3/uL — ABNORMAL HIGH (ref 150–400)
RBC: 2.55 MIL/uL — ABNORMAL LOW (ref 3.87–5.11)
WBC: 13.9 10*3/uL — ABNORMAL HIGH (ref 4.0–10.5)

## 2012-01-20 LAB — COMPREHENSIVE METABOLIC PANEL
AST: 28 U/L (ref 0–37)
Albumin: 1.7 g/dL — ABNORMAL LOW (ref 3.5–5.2)
BUN: 15 mg/dL (ref 6–23)
Calcium: 8.1 mg/dL — ABNORMAL LOW (ref 8.4–10.5)
Chloride: 101 mEq/L (ref 96–112)
Creatinine, Ser: 0.48 mg/dL — ABNORMAL LOW (ref 0.50–1.10)
Total Protein: 6.4 g/dL (ref 6.0–8.3)

## 2012-01-20 MED ORDER — ZINC TRACE METAL 1 MG/ML IV SOLN
INTRAVENOUS | Status: AC
  Administered 2012-01-20: 17:00:00 via INTRAVENOUS
  Filled 2012-01-20: qty 2000

## 2012-01-20 MED ORDER — FAT EMULSION 20 % IV EMUL
240.0000 mL | INTRAVENOUS | Status: AC
  Administered 2012-01-20: 240 mL via INTRAVENOUS
  Filled 2012-01-20: qty 250

## 2012-01-20 NOTE — Progress Notes (Signed)
Occupational Therapy Treatment Patient Details Name: [name] MRN: [id number] DOB: 1955-11-12 Today's Date: 01/20/2012 Time: 4098-1191 OT Time Calculation (min): 24 min  OT Assessment / Plan / Recommendation Comments on Treatment Session Pt progressing slowly but still limited by pain and fatigue. Transferred into the bathroom to 3int today with assist.     Follow Up Recommendations  Inpatient Rehab;Skilled nursing facility    Barriers to Discharge       Equipment Recommendations  Rolling walker with 5" wheels;3 in 1 bedside comode    Recommendations for Other Services    Frequency Min 2X/week   Plan Discharge plan remains appropriate    Precautions / Restrictions Precautions Precautions: Fall Precaution Comments: abdominal surgery, drains, wound vac        ADL  Grooming: Performed;Wash/dry hands;+2 Total assistance;Other (comment) (for safety as pt fatigues quickly) Grooming: Patient Percentage: 80% Where Assessed - Grooming: Unsupported standing Lower Body Dressing: Simulated;Min guard;Other (comment) (to pull up socks higher. bilaterally.) Where Assessed - Lower Body Dressing: Unsupported sitting Toilet Transfer: Performed;+2 Total assistance Toilet Transfer: Patient Percentage: 80% Toilet Transfer Equipment: Raised toilet seat with arms (or 3-in-1 over toilet);Other (comment) (min to stand from 3in1. +2 total 80% to get into bathroom) Toileting - Clothing Manipulation and Hygiene: +2 Total assistance Toileting - Clothing Manipulation and Hygiene: Patient Percentage: 80% Where Assessed - Toileting Clothing Manipulation and Hygiene: Sit to stand from 3-in-1 or toilet Equipment Used: Rolling walker ADL Comments: +2 for safety due to multiple lines/wound vac and also because she fatigues so rapidly. Pt up to 3in1 in bathroom and then stepped over to sink with RW then starts reporting increased fatigue. Assisted to recliner to sit up. Verbal cues for hand placement  and safety with RW    OT Diagnosis:    OT Problem List:   OT Treatment Interventions:     OT Goals ADL Goals Pt Will Perform Grooming: with min assist;Standing at sink;Unsupported ADL Goal: Grooming - Progress: Other (comment) (goal revised - pt transferring into bathroom now) Pt Will Transfer to Toilet: with min assist;Ambulation;with DME;3-in-1 ADL Goal: Toilet Transfer - Progress: Other (comment) (goal revised. pt transferring into bathroom now) ADL Goal: Toileting - Clothing Manipulation - Progress: Progressing toward goals ADL Goal: Additional Goal #1 - Progress: Met  Visit Information  Last OT Received On: 01/20/12 Assistance Needed: +2 PT/OT Co-Evaluation/Treatment: Yes    Subjective Data  Subjective: my stomach still hurts alot Patient Stated Goal: wants to get up to toilet   Prior Functioning       Cognition  Overall Cognitive Status: Appears within functional limits for tasks assessed/performed Arousal/Alertness: Awake/alert Behavior During Session: [facility] for tasks performed    Mobility  Shoulder Instructions Bed Mobility Rolling Right: 5: Supervision Right Sidelying to Sit: 4: Min assist Details for Bed Mobility Assistance: min assist for trunk to upright. min verbal cues for technique. Transfers Transfers: Sit to Stand;Stand to Sit Sit to Stand: 4: Min assist;With upper extremity assist;From bed;From chair/3-in-1 Stand to Sit: 4: Min assist;With upper extremity assist;To chair/3-in-1 Details for Transfer Assistance: verbal cues for hand placement, stepping all the way back to chair/3in1 before sitting, and min assist to stabiize through transitions.        Exercises      Balance     End of Session OT - End of Session Activity Tolerance: Patient limited by fatigue;Patient limited by pain Patient left: in chair;with call bell/phone within reach  GO     Larina Bras, Sabino Gasser  [phone] 01/20/2012, 12:36 PM

## 2012-01-20 NOTE — Progress Notes (Signed)
PARENTERAL NUTRITION CONSULT NOTE - FOLLOW UP  Pharmacy Consult for TNA Indication: Severe Pancreatitis  Allergies  Allergen Reactions  . Ciprofloxacin Nausea Only    REACTION: nausea   Patient Measurements: Height: 5\' 6"  (167.6 cm) Weight: 166 lb 10.7 oz (75.6 kg) IBW/kg (Calculated) : 59.3   Vital Signs: Temp: 98.4 F (36.9 C) (10/02 0500) Temp src: Oral (10/02 0500) BP: 104/60 mmHg (10/02 0500) Pulse Rate: 87  (10/02 0500) Intake/Output from previous day: 10/01 0701 - 10/02 0700 In: 2308.3 [I.V.:278.3; IV Piggyback:150; TPN:1680] Out: 630 [Urine:600; Drains:30] Intake/Output from this shift:    Labs:  [facility] 01/20/12 0450 01/19/12 0451 01/18/12 0510  WBC 13.9* 16.8* 15.7*  HGB 7.2* 7.7* 7.5*  HCT 23.0* 24.4* 23.5*  PLT 538* 623* 636*  APTT -- -- --  INR -- -- --    Basename 01/20/12 0450 01/19/12 0451 01/18/12 0510  NA 132* 132* 134*  K 3.7 3.6 3.5  CL 101 101 103  CO2 22 22 22   GLUCOSE 104* 108* 116*  BUN 15 13 12   CREATININE 0.48* 0.49* 0.50  LABCREA -- -- --  CREAT24HRUR -- -- --  CALCIUM 8.1* 8.2* 8.1*  MG 1.8 -- 1.8  PHOS -- -- 3.6  PROT 6.4 6.8 6.3  ALBUMIN 1.7* 1.8* 1.6*  AST 28 29 28   ALT 13 16 17   ALKPHOS 101 96 101  BILITOT 0.3 0.4 0.4  BILIDIR -- -- --  IBILI -- -- --  PREALBUMIN -- -- 11.4*  TRIG -- -- 130  CHOLHDL -- -- --  CHOL -- -- 83   Estimated Creatinine Clearance: 81.6 ml/min (by C-G formula based on Cr of 0.48).    Basename 01/20/12 0544 01/20/12 0026 01/19/12 1754  GLUCAP 111* 117* 128*   Insulin Requirements in the past 24 hours:   CBGs <150, required 2 units of SSI last 24  Nutritional Goals:   RD recs 9/16: 1762 kcal, 86-95 grams protein  Reestimated s/p extubation 9/23: 1475-1775 kcal, 94-110 grams protein  Clinimix 5/15 @ 81ml/hr + Lipid 20% MWF will provide 96 g protein/day, 1843 kCal on lipid days, 1363 Kcal on non-lipid days for an average of 1569 Kcal per day/week.  Current nutrition:   Diet: NPO  except ice chips  IVF: NS @ KVO  Clinimix E 5/15 @ 80 ml/hr  Assessment:   [name] with h/o recurrent pancreatitis and ETOH abuse, s/p ERCP with sphincterotomy on 10/06/2011 here this admit with acute pancreatitis, developed ileus. Rapid response called 9/12 for respiratory decline and lethargy - pt transfer to ICU and CCM consulted. Intubated 9/12. 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   Pt underwent exploratory laparotomy with debridement of necrotic pancreas and drainage of infected, ruptured pseudocyst 9/15. TNA started 9/16.   9/30 CT abd = extensive loculated fluid in both sides of the abdomen consistent with a pseudocyst.  Additional drained (3rd) placed by IR 10/1, total drains output = 60 ml  Renal: Scr wnl/stable Hepatic fxn: AST/ALT previously elevated ?shock liver 2/2 pancreatitis. Now WNL Lytes: Na+ slightly low but unable to adjust in TNA, Corrected Calcium wnl Pre-Alb: improving, 2.3 (9/17), 12.2 (9/23), 11.4 (9/30) TG/Cholesterol: WNL on  9/17, 9/23, 9/30 CBGs: At goal <150  Plan:    Continue clinimix to E 5/15 at 80 ml/hr, latest prealbumin reveals maintaining protein stores (level remains mod decreased)  PO advancement per CCS, started on sips/chips 9/29  TNA to contain  IV fat emulsion, standard multivitamins and trace elements only on MWF only due to ongoing shortage   TNA labs Monday/Thursdays  Pharmacy will follow up   Geoffry Paradise, PharmD, BCPS Pager: [phone] 7:46 AM Pharmacy #: [phone]

## 2012-01-20 NOTE — Progress Notes (Signed)
Subjective: Patient states that she feels as though she is improving in terms of her medical status as well as her function. She states that the pain that she was experiencing this morning has been alleviated since interventional radiology has been adjusted to drain that was placed yesterday. She has however been reluctant to increase her mobility. I had a long conversation with the patient regarding the importance of improving mobility so as to the deconditioning.  Objective: Filed Vitals:   01/19/12 2230 01/20/12 0500 01/20/12 1207 01/20/12 1700  BP: 108/69 104/60 104/52 112/64  Pulse: 92 87 91 91  Temp: 98 F (36.7 C) 98.4 F (36.9 C) 97.8 F (36.6 C) 98.1 F (36.7 C)  TempSrc: Oral Oral Oral Oral  Resp: 18 18 18 18   Height:      Weight:      SpO2: 94% 96% 94% 93%   Weight change:   Intake/Output Summary (Last 24 hours) at 01/20/12 1737 Last data filed at 01/20/12 1200  Gross per 24 hour  Intake 1278.33 ml  Output    930 ml  Net 348.33 ml    General: Alert, awake, oriented x3, in no acute distress.  HEENT: [place]/AT PEERL, EOMI Heart: Regular rate and rhythm.  Lungs: Clear to auscultation Abdomen: Mildly distended, positive bowel sounds. And won't with good granulation. Midline laparotomy incision with 2 JP drains and drainage. Neuro: No focal neurological deficits noted. Musculoskeletal: No warm swelling or erythema around joints, no spinal tenderness noted.    Lab Results:  Basename 01/20/12 0450 01/19/12 0451 01/18/12 0510  NA 132* 132* --  K 3.7 3.6 --  CL 101 101 --  CO2 22 22 --  GLUCOSE 104* 108* --  BUN 15 13 --  CREATININE 0.48* 0.49* --  CALCIUM 8.1* 8.2* --  MG 1.8 -- 1.8  PHOS -- -- 3.6    Basename 01/20/12 0450 01/19/12 0451  AST 28 29  ALT 13 16  ALKPHOS 101 96  BILITOT 0.3 0.4  PROT 6.4 6.8  ALBUMIN 1.7* 1.8*   No results found for this basename: LIPASE:2,AMYLASE:2 in the last 72 hours  Basename 01/20/12 0450 01/19/12 0451 01/18/12 0510    WBC 13.9* 16.8* --  NEUTROABS -- -- 11.8*  HGB 7.2* 7.7* --  HCT 23.0* 24.4* --  MCV 90.2 89.7 --  PLT 538* 623* --   No results found for this basename: CKTOTAL:3,CKMB:3,CKMBINDEX:3,TROPONINI:3 in the last 72 hours No components found with this basename: POCBNP:3 No results found for this basename: DDIMER:2 in the last 72 hours No results found for this basename: HGBA1C:2 in the last 72 hours  Basename 01/18/12 0510  CHOL 83  HDL --  LDLCALC --  TRIG 130  CHOLHDL --  LDLDIRECT --   No results found for this basename: TSH,T4TOTAL,FREET3,T3FREE,THYROIDAB in the last 72 hours No results found for this basename: VITAMINB12:2,FOLATE:2,FERRITIN:2,TIBC:2,IRON:2,RETICCTPCT:2 in the last 72 hours  Micro Results: Recent Results (from the past 240 hour(s))  ANAEROBIC CULTURE     Status: Normal (Preliminary result)   Collection Time   01/19/12  2:00 PM      Component Value Range Status Comment   Specimen Description PERITONEAL CAVITY   Final    Special Requests Normal   Final    Gram Stain     Final    Value: FEW WBC PRESENT,BOTH PMN AND MONONUCLEAR     NO ORGANISMS SEEN   Culture     Final    Value: NO ANAEROBES ISOLATED; CULTURE IN  PROGRESS FOR 5 DAYS   Report Status PENDING   Incomplete   BODY FLUID CULTURE     Status: Normal (Preliminary result)   Collection Time   01/19/12  2:00 PM      Component Value Range Status Comment   Specimen Description FLUID PERITONEAL CAVITY   Final    Special Requests NONE   Final    Gram Stain     Final    Value: FEW WBC PRESENT,BOTH PMN AND MONONUCLEAR     NO ORGANISMS SEEN   Culture NO GROWTH   Final    Report Status PENDING   Incomplete     Studies/Results: Ct Abdomen Pelvis Wo Contrast  12/30/2011  *RADIOLOGY REPORT*  Clinical Data: Abdominal pain.  Nausea and vomiting.  Elevated liver function tests.  Follow-up acute pancreatitis.  CT ABDOMEN AND PELVIS WITHOUT CONTRAST  Technique:  Multidetector CT imaging of the abdomen and pelvis was  performed following the standard protocol without intravenous contrast.  Comparison: 12/27/2011  Findings: New small bilateral pleural effusions and bibasilar atelectasis or infiltrates are seen.  A moderate size hiatal hernia is again demonstrated.  Moderate ascites within the abdomen and pelvis has significantly increased since previous study.  New diffuse body wall edema also noted.  A persistent small focal fluid collection is seen between the posterior wall the gastric antrum and the pancreatic neck which measures approximately 2 x 3 cm and is not significantly changed. This is suspicious for a small pancreatic pseudocyst.  Swelling of the pancreatic head and peripancreatic inflammatory changes remain consistent with acute pancreatitis, but show no significant change.  Surgical clips again seen from prior cholecystectomy.  No definite evidence of biliary dilatation. Liver, spleen, adrenal glands, and kidneys are unremarkable in appearance on this noncontrast study.  No soft tissue masses or lymphadenopathy identified.  Sigmoid diverticulosis is again demonstrated, however there is no evidence of diverticulitis.  Foley catheter is now seen within the bladder which is collapsed.  Increased colonic ileus pattern is demonstrated.  IMPRESSION:  1. No significant change in appearance of the acute pancreatitis involving the pancreatic head and neck with probable 3 cm pseudocyst along the posterior wall of gastric antrum. 2.  Significant increase in moderate ascites and diffuse body wall edema since prior study.  New bilateral pleural effusions and bibasilar atelectasis or infiltrates also noted. 3.  Worsening colonic ileus pattern. 4.  Moderate hiatal hernia.   Original Report Authenticated By: Danae Orleans, M.D.    Dg Chest 2 View  12/30/2011  *RADIOLOGY REPORT*  Clinical Data: Low grade temperature and wheezes.  CHEST - 2 VIEW  Comparison: 03/24/2011  Findings: Two views of the chest demonstrate bibasilar  densities suggestive for pleural fluid.  Prominent interstitial markings may represent mild edema.  There are increased densities in the right lung and cannot exclude airspace disease.  Heart size appears to be upper limits of normal.  Negative for a pneumothorax.  IMPRESSION: There is evidence for bilateral pleural effusions and cannot exclude mild interstitial edema.  Patchy densities in the right lung may represent asymmetric edema versus infection.   Original Report Authenticated By: Richarda Overlie, M.D.    Dg Abd 1 View  01/06/2012  *RADIOLOGY REPORT*  Clinical Data: Evaluate pending tube placement  ABDOMEN - 1 VIEW  Comparison: Portable abdomen 01/05/2012  Findings: The feeding tube tip is in the distal body - proximal antrum of the stomach.  The bowel gas pattern is nonspecific. Contrast is noted scattered throughout  the nondistended colon.  IMPRESSION: Feeding tube tip in distal body - proximal antrum of the stomach.   Original Report Authenticated By: Juline Patch, M.D.    Dg Abd 1 View  01/05/2012  *RADIOLOGY REPORT*  Clinical Data: Evaluate feeding tube position.  ABDOMEN - 1 VIEW  Comparison: 01/05/2012 fluoroscopic image  Findings: KUB following administration of Omnipaque contrast via the patient's feeding tube demonstrates the tube tip to now be positioned within the mid to distal stomach.  However, the administered contrast is noted to reflux proximally rather than continue into the duodenum.  There is residual contrast within the colon from a prior examination.  Mild bibasilar opacities/pleural effusions are noted.  Surgical clips right upper quadrant.  IMPRESSION: Feeding tube tip has migrated distally, now located within the mid/distal stomach.  However, administered contrast is noted to reflux into the more proximal stomach rather than continue into the duodenum. Therefore, I would not recommend tube feeds unless the tube tip has passed the gastroduodenal junction. Recommend repeat radiograph  in the a.m. to evaluate for tube tip position at that time.   Original Report Authenticated By: Waneta Martins, M.D.    Ct Guided Abscess Drain  01/19/2012  *RADIOLOGY REPORT*  CT GUIDED ABSCESS DRAIN PLACEMENT  Date: 01/19/2012  Clinical History: History of necrotic pancreatitis 10 days status post exploratory laparotomy, pancreatic necrosectomy, and placement of to abdominal drains.  She now has a large peripherally enhancing loculated peritoneal fluid collection concerning for abscess. Percutaneous drain is requested.  Procedures Performed: 1. CT guided drain placement  Interventional Radiologist:  Sterling Big, MD  Sedation: Moderate (conscious) sedation was used.  4 mg Versed, 200 mcg Fentanyl were administered intravenously.  The patient's vital signs were monitored continuously by radiology nursing throughout the procedure.  Sedation Time: 25 minutes  PROCEDURE/FINDINGS:   Informed consent was obtained from the patient following explanation of the procedure, risks, benefits and alternatives. The patient understands, agrees and consents for the procedure. All questions were addressed. A time out was performed.  Maximal barrier sterile technique utilized including caps, mask, sterile gowns, sterile gloves, large sterile drape, hand hygiene, and betadine skin prep.  A planning axial CT scan was performed.  The large right hemi abdominal peritoneal fluid collection was identified.  An appropriate skin entry site was selected and marked.  Local anesthesia was achieved with infiltration of 1% lidocaine.  Using CT guidance, the fluid collection was punctured with an 18 gauge trocar needle.  A guide wire was advanced caudally within the fluid collection down into the anatomic pelvis.  The tract was then serially dilated to 12 [place] and a 12-French Cook all-purpose drainage catheter was advanced over the wire and positioned within the anatomic pelvis.  The 12-French Cook drainage catheter was modified  with multiple additional side holes covering approximately 20 cm of its length to facilitate complete drainage of the fluid collection and prevent loculation into pockets. Approximately 300 ml of purulent fluid was initially aspirated. The tube was connected to gravity drainage and secured to the skin with both Prolene suture and a plastic bumper.  The patient tolerated the procedure well, there was no immediate complication. She was returned to her room in stable condition.  A sample material was sent for culture.  IMPRESSION:  Successful placement of a 69 French Cook all-purpose drainage catheter modified with additional side holes into the right abdominal peritoneal abscess.  Approximately 300 ml of purulent material was aspirated.  A sample was sent  for culture.  Signed,  Sterling Big, MD Vascular & Interventional Radiologist [name] Radiology   Original Report Authenticated By: Threasa Beards Abdomen Pelvis W Contrast  01/18/2012  *RADIOLOGY REPORT*  Clinical Data: Acute pancreatitis.  Epigastric pain with fever. Pancreatic pseudocyst.  CT ABDOMEN AND PELVIS WITH CONTRAST  Technique:  Multidetector CT imaging of the abdomen and pelvis was performed following the standard protocol during bolus administration of intravenous contrast.  Contrast: OMNIPAQUE IOHEXOL 300 MG/ML  SOLN  Comparison: CT scan dated 01/02/2012  Findings: The patient has significantly increased moderate bilateral pleural effusions with secondary atelectasis at the lung bases.  Heart size is normal. No change in the large hiatal hernia.  Two pseudocyst drainage catheters are present in the upper abdomen. There has been slight decrease in the fluid along the left pericolic gutter.  The patient now has increased enhancement of the margins of the loculated fluid in the abdomen and pelvis.  There is a fluid collection which extends from the overlying the top of the right lobe of the liver down the right pericolic gutter into the  pelvis.  This collection measures 35 x 17 x 5.3 cm.  The drainage catheter in the left upper quadrant is within the left upper quadrant recess at this fluid collection that I suspect that the fluid with draining pattern with a midline pelvic catheter.  There is a small amount of residual pancreatic fluid around the mid abdominal catheter.  This has decreased.  The liver, pancreas, adrenal glands, and kidneys are normal and stable.  Small pancreatic pseudocyst in the head of the pancreas is better defined measures 16 mm.  No dilated bowel.  Uterus and ovaries are normal.  No acute osseous abnormality.  IMPRESSION:  1.  The patient has developed an enhancing rim around the extensive loculated fluid in both sides of the abdomen consistent with a pseudocyst.  The most prominent portion of the collection is in the right side of the abdomen and the pelvis.  The draining in the left upper quadrant is within the recess at this collection but I suspect another drain in the pelvis might be useful. 2.  Slightly better definition of the small pseudocyst in the head of the pancreas. 3.  Interval development of moderate bilateral pleural effusions.   Original Report Authenticated By: Gwynn Burly, M.D.    Ct Abdomen Pelvis W Contrast  01/08/2012  *RADIOLOGY REPORT*  Clinical Data: Follow-up pancreatitis and abdominal and pelvic fluid collections.  CT ABDOMEN AND PELVIS WITH CONTRAST  Technique:  Multidetector CT imaging of the abdomen and pelvis was performed following the standard protocol during bolus administration of intravenous contrast.  Contrast: OMNIPAQUE IOHEXOL 300 MG/ML  SOLN  Comparison: 09/14/2013and 12/27/2011  Findings: Mild increase in size of small bilateral pleural effusions is seen.  Intraperitoneal drainage catheters are now seen in place, and decreased ascites is seen within the abdomen pelvis since prior exam.  Foley catheter again seen within the bladder which is collapsed.  A small pseudocyst  is again seen in the pancreatic head.  This shows mild enlargement compared to recent study 6 days ago, currently measuring 2.3 x 2.4 cm, but is significantly decreased in size compared to earlier exam on 12/27/2011. No other loculated peri pancreatic fluid collections are identified. There is no evidence of distal pancreatic atrophy or pancreatic ductal dilatation.  Prior cholecystectomy noted.  No evidence of biliary ductal dilatation.  Hepatic steatosis is again demonstrated, without evidence  of focal liver mass.  The spleen, adrenal glands, and kidneys are normal in appearance.  No evidence of lymphadenopathy.  Uterus and adnexa are unremarkable.  Sigmoid diverticulosis is noted, however there is no evidence of diverticulitis.  No evidence of bowel obstruction. Diffuse body wall edema again noted. Moderate hiatal hernia again seen.  IMPRESSION:  1.  Decrease in moderate abdominal and pelvic ascites with intraperitoneal drainage catheters now seen in place.  Mild increase in small bilateral pleural effusions. No significant change in diffuse body wall edema. 2.  Persistent small pseudocyst in the area the pancreatic head, which is decreased in size since earlier exam on 12/27/2011. Continued follow-up by CT is recommended. 3.  Ancillary findings again noted including moderate hiatal hernia, hepatic steatosis, and sigmoid diverticulosis.   Original Report Authenticated By: Danae Orleans, M.D.    Ct Abdomen Pelvis W Contrast  01/02/2012  **ADDENDUM** CREATED: 01/02/2012 17:36:05  I spoke with the critical care physician by telephone, and in further review of the CT images, the gas bubbles which I initially thought were in the pelvic ascites I believe are actually within a completely collapsed urinary bladder which has been displaced anteriorly by the ascites in the pelvis.  Therefore, I do not believe this represents free intraperitoneal air.  **END ADDENDUM** SIGNED BY: Arnell Sieving, M.D.   01/02/2012   *RADIOLOGY REPORT*  Clinical Data: Follow up pancreatitis.  Epigastric abdominal pain. Leukocytosis with white blood cell count 38,800.  CT ABDOMEN AND PELVIS WITH CONTRAST  Technique:  Multidetector CT imaging of the abdomen and pelvis was performed following the standard protocol during bolus administration of intravenous contrast.  Contrast: OMNIPAQUE IOHEXOL 300 MG/ML. Oral contrast was also administered.  Comparison: CT abdomen and pelvis 12/30/2011, 12/27/2011, 09/27/2011 03/18/2011, 11/27/2010.  Findings: Interval increase in the amount of abdominal and pelvic ascites since the examination 3 days ago, now quite large.  Gas bubbles within the ascitic fluid in the pelvis.  Enhancement of the peritoneum and omentum, with mild thickening.  The fluid has Hounsfield measurement of near 0, indicating simple fluid.  Persistent edema/inflammation surrounding the head of the pancreas, with an approximate 1.8 x 2.6 cm pseudocyst at the junction of the head and body; this is unchanged from the examination 3 days ago and has increased slightly since the June, 2013 examination. Developing pseudocyst in the pancreatic head measuring approximately 1.3 x 2.6 cm, not present on the June, 2013 examination.  Gallbladder surgically absent.  No unexpected biliary ductal dilation.  Mild diffuse hepatic steatosis without focal hepatic parenchymal abnormality.  Normal appearing spleen, adrenal glands, and kidneys.  Moderate to severe aorto-iliac atherosclerosis without aneurysm.  Large hiatal hernia.  Nasogastric tube looped within the intrathoracic portion of the stomach.  Oral contrast material in the distal esophagus.  Upper normal colon or small bowel loops without obstruction.  Gaseous distention of the transverse colon. Sigmoid colon diverticulosis without evidence of acute diverticulitis.  Uterus and ovaries unremarkable by CT.  Phleboliths in both sides of the low pelvis.  Bone window images demonstrate degenerative  changes involving the lower thoracic and lumbar spine. Consolidation in the deep posterior lower lobes bilaterally with air bronchograms.  Small left pleural effusion.  Diffuse body wall edema.  Heart size upper normal.  IMPRESSION:  1. Since the CT 3 days ago, interval increase in the ascites diffusely throughout the abdomen and pelvis.  Gas bubbles are present within the ascitic fluid in the pelvis, presumably from recent tap.  2. Enhancement and mild thickening of the peritoneum and omentum, consistent with inflammation.  The ascitic fluid has a Hounsfield measurement near 0, indicating simple fluid (not hemorrhagic). 3.  Probable acute pancreatitis involving the head of the pancreas. Stable pseudocyst at the junction of the head and body.  Developing pseudocyst in the head of the pancreas. 4.  Diffuse hepatic steatosis without focal hepatic parenchymal abnormality. 5.  Transverse colon ileus. 6.  Sigmoid colon diverticulosis without evidence of acute diverticulitis. 7.  Large hiatal hernia.  The nasogastric tube is looped within the intrathoracic stomach.  Oral contrast material in the distal esophagus is consistent with reflux. 8.  Bilateral lower lobe atelectasis and/or pneumonia.  Small left pleural effusion. 9.  Age advanced aorto-iliac atherosclerosis. 10.  Anasarca.   Original Report Authenticated By: Arnell Sieving, M.D.    Ct Abdomen Pelvis W Contrast  12/27/2011  *RADIOLOGY REPORT*  Clinical Data: Pain.  Pancreatitis.  White cell count 15.8.  Lipase 177.  Field  CT ABDOMEN AND PELVIS WITH CONTRAST  Technique:  Multidetector CT imaging of the abdomen and pelvis was performed following the standard protocol during bolus administration of intravenous contrast.  Contrast: OMNIPAQUE IOHEXOL 300 MG/ML  SOLN  Comparison: 09/27/2011.  Findings: Atelectasis or infiltration in both lung bases.  Large esophageal hiatal hernia.  There is extensive inflammatory infiltration and edema in the upper  abdominal fat surrounding the pancreas and extending into the mesenteric and omental regions.  Changes are consistent with progressing acute pancreatitis.  Peripancreatic fluid collections are again demonstrated consistent with pseudocyst, similar to previous study.  Mild pancreatic ductal dilatation.  There appears to be homogeneous enhancement of the pancreatic parenchyma without evidence of pancreatic necrosis.  No focal abscess.  Mild intra and extrahepatic bile duct dilatation. Portal and mesenteric vessels appear patent.  Surgical absence of the gallbladder.  The liver, spleen, adrenal glands, kidneys, and retroperitoneal lymph nodes are unremarkable.  Calcification of the abdominal aorta without aneurysm.  The stomach, small bowel, and colon are not distended.  No free air in the abdomen.  Pelvis:  Small amount of free fluid in the pelvis, likely related to inflammatory process.  No loculated fluid collections.  Bladder is decompressed.  The uterus and adnexal structures are not enlarged.  The appendix is normal.  No diverticulitis.  No significant lymphadenopathy in the pelvis.  IMPRESSION: Significant increase in upper abdominal inflammatory changes and edema consistent with progression of acute pancreatitis.  Fluid collections consistent with pseudocyst appears stable.   Original Report Authenticated By: Marlon Pel, M.D.    US Paracentesis  01/03/2012  *RADIOLOGY REPORT*  Clinical Data: Sepsis, pancreatitis, possible additional intra- abdominal process, ascites  ULTRASOUND GUIDED PARACENTESIS  Comparison:  None  An ultrasound guided paracentesis was thoroughly discussed with the patient and questions answered.  The benefits, risks, alternatives and complications were also discussed.  The patient understands and wishes to proceed with the procedure.  Written consent was obtained.  Ultrasound was performed to localize and mark an adequate pocket of fluid in the left lower quadrant of the abdomen.  The fluid did appear to have some debris evident.  The area was then prepped and draped in the normal sterile fashion.  1% Lidocaine was used for local anesthesia.  Under ultrasound guidance a 19 gauge Yueh catheter was introduced.  Paracentesis was performed.  The catheter was removed and a dressing applied.  Complications:  None  Findings:  A total of approximately 1.5 liters of  turbid brown- green fluid, with free floating debris was removed.  A fluid sample was sent for laboratory analysis.  IMPRESSION: Successful ultrasound guided paracentesis yielding 1.5 liters of ascites. However the fluid is concerning for acute intra-abdominal process. Findings discussed with attending/ordering physician.  Read BY Brayton El PA-C   Original Report Authenticated By: Donavan Burnet, M.D.    Dg Chest Port 1 View  01/13/2012  *RADIOLOGY REPORT*  Clinical Data: Atelectasis.  PORTABLE CHEST - 1 VIEW  Comparison: 01/12/2012.  Findings: Right IJ central line appears unchanged.  Low lung volumes are present with greater than left basilar atelectasis. Drain projects over the left chest and left upper quadrant. Cardiomediastinal contours are unchanged.  IMPRESSION: No interval change.   Original Report Authenticated By: Andreas Newport, M.D.    Dg Chest Port 1 View  01/12/2012  *RADIOLOGY REPORT*  Clinical Data: Extubation.  PORTABLE CHEST - 1 VIEW  Comparison: 01/11/2012.  Findings: Endotracheal tube removed.  Right IJ central line persists.  Decreased lung volumes are present, expected after extubation.  Persistent bilateral effusions and basilar atelectasis. Cardiomediastinal contours appear unchanged.  IMPRESSION: Interval extubation with expected lower lung volumes.  Right IJ line persists.   Original Report Authenticated By: Andreas Newport, M.D.    Dg Chest Port 1 View  01/11/2012  *RADIOLOGY REPORT*  Clinical Data: Endotracheal tube placement.  PORTABLE CHEST - 1 VIEW  Comparison: 01/09/2012.  Findings: Endotracheal  tube tip is 30 mm from the carina.  Lung volumes are lower than on prior exam.  Retrocardiac density is present, retrocardiac density is present likely representing atelectasis. There may be a small left pleural effusion.  Discoid atelectasis over the right hemidiaphragm.  Right IJ central line is unchanged.  IMPRESSION:  1.  Stable support apparatus. 2.  Increasing bibasilar atelectasis.  Probable small left pleural effusion.   Original Report Authenticated By: Andreas Newport, M.D.    Dg Chest Port 1 View  01/09/2012  *RADIOLOGY REPORT*  Clinical Data: Pneumonia.  Pleural effusions.  PORTABLE CHEST - 1 VIEW  Comparison: 01/08/2012  Findings: Endotracheal tube tip 3.7 cm above the carina.  Right IJ line tip projects over the SVC.  Low lung volumes are present, causing crowding of the pulmonary vasculature.  The patient is rotated to the right on today's exam, resulting in reduced diagnostic sensitivity and specificity. Stable mild atelectasis noted along the right hemidiaphragm. Minimally improved aeration at the left lung base, although retrocardiac airspace opacity persists.  IMPRESSION:  1.  Stable appearance of bibasilar airspace opacity and low lung volumes.  Support apparatus unchanged.   Original Report Authenticated By: Dellia Cloud, M.D.    Dg Chest Port 1 View  01/08/2012  *RADIOLOGY REPORT*  Clinical Data: Pleural effusion.  PORTABLE CHEST - 1 VIEW  Comparison: Chest 01/06/2012 and 01/07/2012.  Findings: Support tubes and lines are unchanged. Left pleural effusion and left worse than right basilar airspace disease appear unchanged.  No pneumothorax identified.  IMPRESSION: No interval change.   Original Report Authenticated By: Bernadene Bell. Maricela Curet, M.D.    Dg Chest Port 1 View  01/07/2012  *RADIOLOGY REPORT*  Clinical Data: Endotracheal tube placement.  PORTABLE CHEST - 1 VIEW  Comparison: Chest 01/06/2012.  Findings: Endotracheal tube is in place with tip in good position at the level of  the clavicular heads.  Feeding tube is no longer visualized.  Right IJ catheter remains in place.  Left basilar airspace opacity is unchanged.  Right lung appears clear.  No  pneumothorax.  IMPRESSION:  1.  ET tube in good position. 2.  No change in left basilar airspace opacity.   Original Report Authenticated By: Bernadene Bell. Maricela Curet, M.D.    Dg Chest Port 1 View  01/06/2012  *RADIOLOGY REPORT*  Clinical Data: Pleural effusion.  PORTABLE CHEST - 1 VIEW  Comparison: 01/05/2012.  Findings: Patient is rotated to the left.  The endotracheal tube appears similar to the prior exam.  The nasogastric tube has been replaced with a feeding tube and the tip is not visualized.  There is redundant tubing over the thoracic inlet and there may be a coiled loop of the feeding tube in the proximal esophagus or hypopharynx.  Consider retracting and re-advancing the feeding tube for better positioning and to see if the loop produces.  Bibasilar atelectasis remains present. Right IJ central line remains present.  IMPRESSION: 1.  Endotracheal tube and right IJ central line are unchanged. 2.  Exchange of nasogastric tube for feeding tube.  There appears to be a redundant loop in the hypopharynx and upper esophagus however this could be external to the patient as well.  The inspection is recommended. 3.  Persistent bilateral basilar atelectasis and probable left pleural effusion.  This is a call report.   Original Report Authenticated By: Andreas Newport, M.D.    Dg Chest Port 1 View  01/05/2012  *RADIOLOGY REPORT*  Clinical Data: Atelectasis.  Effusions.  PORTABLE CHEST - 1 VIEW  Comparison: Chest 01/03/2012 and 01/04/2012.  Findings: Support tubes and lines are unchanged.  Lung volumes are lower than on the most recent study with increased basilar atelectasis, worse on the left.  No pneumothorax is identified. Heart size is normal.  IMPRESSION: Increased bibasilar atelectasis.  No other change.   Original Report Authenticated By:  Bernadene Bell. Maricela Curet, M.D.    Dg Chest Port 1 View  01/04/2012  *RADIOLOGY REPORT*  Clinical Data: Increased heart rate.  Decreased oxygen saturation.  PORTABLE CHEST - 1 VIEW  Comparison: Plain film chest 01/03/2012.  Findings: Support tubes and lines are unchanged.  No pneumothorax is identified.  Discoid atelectasis in the lung bases is more notable on the right.  The lungs are otherwise clear.  IMPRESSION:  1.  Support apparatus in good position. 2.  Bibasilar atelectasis.   Original Report Authenticated By: Bernadene Bell. Maricela Curet, M.D.    Dg Chest Port 1 View  01/03/2012  *RADIOLOGY REPORT*  Clinical Data: Acute respiratory distress syndrome, endotracheal tube placement.  PORTABLE CHEST - 1 VIEW  Comparison: January 02, 2012.  Findings: Cardiomediastinal silhouette appears normal. Endotracheal tube is in grossly good position with tip several centimeters above the carina.  Nasogastric tube tip is coiled within proximal stomach.  Right internal jugular catheter line is unchanged in position. Left basilar opacity is unchanged compared to prior exam.  No change is noted in mild bilateral pleural effusions.  IMPRESSION: No change compared to prior exam.   Original Report Authenticated By: Venita Sheffield., M.D.    Dg Chest Port 1 View  01/02/2012  *RADIOLOGY REPORT*  Clinical Data: Endotracheal tube  PORTABLE CHEST - 1 VIEW  Comparison: Yesterday  Findings: Endotracheal tube tip 30.0 cm from the carina.  Lungs markedly under aerated with increasing bibasilar atelectasis and/or airspace disease.  Pleural effusions suspected.  Right internal jugular vein center venous catheter stable.  No pneumothorax.  IMPRESSION: Increasing bibasilar atelectasis verses airspace disease. Otherwise, no significant change.   Original Report Authenticated By: Donavan Burnet, M.D.  Dg Chest Port 1 View  01/01/2012  *RADIOLOGY REPORT*  Clinical Data: Evaluate endotracheal tube placement.  PORTABLE CHEST - 1 VIEW   Comparison: Chest x-ray 12/31/2011  Findings: An endotracheal tube is in place with tip 4.2 cm above the carina. There is a right-sided internal jugular central venous catheter with tip terminating in the superior cavoatrial junction. A nasogastric tube is seen extending into the stomach, however, the tip of the nasogastric tube extends below the lower margin of the image.  Lung volumes remain low, and there are bibasilar opacities, favored to represent atelectasis (underlying airspace consolidation is difficult to exclude, but is not favored.  Small bilateral pleural effusions are unchanged.  Pulmonary vascular congestion, without frank pulmonary edema.  Heart size appears upper limits of normal.  Mediastinal contours are unremarkable.  Atherosclerosis in the thoracic aorta.  IMPRESSION: 1.  Support apparatus, as above. 2.  Low lung volumes with persistent bibasilar atelectasis and small bilateral pleural effusions. 3.  Atherosclerosis.   Original Report Authenticated By: Florencia Reasons, M.D.    Dg Chest Port 1 View  12/31/2011  *RADIOLOGY REPORT*  Clinical Data: 56 year old female endotracheal tube.  Line placement.  Acute respiratory failure.  PORTABLE CHEST - 1 VIEW  Comparison: 12/31/2011 and earlier.  Findings: Semi upright AP portable view 1429 hours.  The patient is known debated.  Endotracheal tube tip is in good position between the level of clavicles and carina.  Enteric tube courses to the abdomen, partially looped within the known hiatal hernia but terminates below the level of the diaphragm (probably distal stomach).  Right IJ central line placed, distal tip felt to be at the T6-T7 level corresponding to the SVC.  Linear opacity below the level is felt related the spinous processes.  Bilateral pleural effusions.  Bibasilar collapse or consolidation. No pneumothorax.  Stable increased interstitial markings which may relate to vascular congestion.  Stable cardiac size and mediastinal contours.   IMPRESSION: 1.  Endotracheal tube and enteric tube appear in good position. 2.  Right IJ central line tip felt to terminate at the SVC level, linear opacity below the level felt related to the spinous processes. 3.  Bilateral pleural effusions and lower lobe collapse consolidation with superimposed vascular congestion.   Original Report Authenticated By: Harley Hallmark, M.D.    Dg Chest Port 1 View  12/31/2011  *RADIOLOGY REPORT*  Clinical Data: Shortness of breath.  PORTABLE CHEST - 1 VIEW  Comparison: 12/30/2011  Findings: Shallow inspiration.  Cardiac enlargement with pulmonary vascular congestion and developing perihilar edema more prominent since previous study.  Small bilateral pleural effusions with basilar atelectasis or infiltration.  No pneumothorax.  IMPRESSION: Increasing pulmonary vascular congestion and edema with small bilateral pleural effusions and basilar atelectasis filtration.   Original Report Authenticated By: Marlon Pel, M.D.    Dg Abd Acute W/chest  12/26/2011  *RADIOLOGY REPORT*  Clinical Data: Epigastric pain and nausea.  ACUTE ABDOMEN SERIES (ABDOMEN 2 VIEW & CHEST 1 VIEW)  Comparison: CT abdomen and pelvis 09/27/2011.  Findings: Shallow inspiration with atelectasis or infiltration in the lung bases.  Normal heart size and pulmonary vascularity.  No blunting of costophrenic angles.  No pneumothorax.  Mediastinal contours are intact.  Calcification of the aorta.  Esophageal hiatal hernia behind the heart.  Scattered gas and stool in the colon.  No small or large bowel distension.  No abnormal air fluid levels.  No free air in the abdomen.  Surgical clips in the right upper  quadrant.  Degenerative changes in the spine and hips.  IMPRESSION: Shallow inspiration with infiltration or atelectasis in the lung bases.  Esophageal hiatal hernia.  Nonobstructing bowel gas pattern.   Original Report Authenticated By: Marlon Pel, M.D.    Dg Intro Long Gi Tube  01/05/2012   *RADIOLOGY REPORT*  Clinical Data: Feeding tube placement.  DG INTRO LONG GI TUBE  Technique:  Feeding tube placement under fluoroscopy guidance  Fluoroscopy time: 21 seconds  Comparison:  01/02/2012 CT  Findings:  Under real time observation, feeding tube advanced distal to the hiatal hernia, into the intra-abdominal stomach. Unable to advance into the duodenum.  IMPRESSION: Feeding tube projects over the mid stomach. This is a suboptimal position for tube feeds, however recommend a follow-up radiograph to evaluate for distal migration of the tube via peristalsis.   Original Report Authenticated By: Waneta Martins, M.D.     Medications: I have reviewed the patient's current medications. Scheduled Meds:   . antiseptic oral rinse  15 mL Mouth Rinse QID  . chlorhexidine  15 mL Mouth Rinse BID  . enoxaparin (LOVENOX) injection  40 mg Subcutaneous Q24H  . folic acid  1 mg Intravenous Daily  . insulin aspart  2-6 Units Subcutaneous Q6H  . LORazepam  1 mg Intravenous Q8H  . metronidazole  500 mg Intravenous Q8H  . octreotide  100 mcg Subcutaneous Q8H  . pantoprazole (PROTONIX) IV  40 mg Intravenous Q12H  . piperacillin-tazobactam (ZOSYN)  IV  3.375 g Intravenous Q8H   Continuous Infusions:   . sodium chloride 20 mL/hr (01/13/12 0042)  . TPN (CLINIMIX) +/- additives 80 mL/hr at 01/20/12 1729   And  . fat emulsion 240 mL (01/20/12 1730)  . TPN (CLINIMIX) +/- additives 80 mL/hr at 01/18/12 1748   And  . fat emulsion 250 mL (01/18/12 1748)  . TPN (CLINIMIX) +/- additives 80 mL/hr at 01/19/12 1722   PRN Meds:.acetaminophen, HYDROmorphone (DILAUDID) injection, menthol-cetylpyridinium, ondansetron (ZOFRAN) IV, polyvinyl alcohol, sodium chloride Assessment/Plan: Patient Active Hospital Problem List:  Ruptured pancreatic pseudocyst with peritonitis:  - patient has had a very complicated course. Currently she is on day #16 no Flagyl day #20 of Zosyn. The duration of antibiotic treatment will be  determined by Gen. surgery and is unspecified at this time. -Patient had a new area of collection in the abdomen and had a JP drain placed by interventional radiology yesterday morning which has had about 30 cc of fluid out. -Nutrition management per surgery   Anemia: -s/p transfusion of one unit packed blood cells on 9/28 -Hemoglobin drifted down to 7.2 today. We'll continue to monitor and transfuse as necessary    Gastric ulcer: -Patient status post EGD which showed gastric ulcer.  -Continue twice a day PPI treatment   C. difficile colitis: -As noted above patient on day #16 of Flagyl which is a completed course of treatment for C. Difficile -Repeat C. difficile from 9/29 still pending as patient has had no further stool.      LOS: 25 days

## 2012-01-20 NOTE — Progress Notes (Signed)
Rehab admissions - Continuing to follow progress.  Noted additional drain placed yesterday.  Noted PT/OT held yesterday.  Noted patient to try clear liquids today.  Not appropriate for inpatient rehab admission at this point.  Call me for questions.  #[phone]

## 2012-01-20 NOTE — Progress Notes (Signed)
Changed patient's wound VAC dressing. After removing black foam gauze, was able to observe a pink wound bed with minimal serous drainage. Applied new foam and tegaderm dressing to site after irrigating wound. Wound measures 4in by 1 in wide. Reconnected to wound vac apparatus and resumed suctioning at .

## 2012-01-20 NOTE — Progress Notes (Signed)
Physical Therapy Treatment Patient Details Name: [name] MRN: [id number] DOB: 1955/07/11 Today's Date: 01/20/2012 Time: 4098-1191 PT Time Calculation (min): 25 min  PT Assessment / Plan / Recommendation Comments on Treatment Session  Pt continues to demonstrate general weakness and limited activity tolerance. Hgb 7.2 on today (MD considering transfusion). Mobility slowly improving. Not sure if CIR will be an option. Will likely need SNF.     Follow Up Recommendations  Skilled Nursing Facility    Barriers to Discharge        Equipment Recommendations  Rolling walker with 5" wheels;3 in 1 bedside comode    Recommendations for Other Services    Frequency Min 3X/week   Plan Discharge plan remains appropriate    Precautions / Restrictions Precautions Precautions: Fall;Other (comment) Precaution Comments: abdominal surgery, drains (3), wound vac Restrictions Weight Bearing Restrictions: No   Pertinent Vitals/Pain 7-8/10 abdomen    Mobility  Bed Mobility Bed Mobility: Rolling Right;Right Sidelying to Sit Rolling Right: 5: Supervision Right Sidelying to Sit: 4: Min assist Details for Bed Mobility Assistance: Assist for trunk to upright. VCs safety, technique, hand placmeent.  Transfers Transfers: Sit to Stand;Stand to Sit Sit to Stand: 4: Min assist;From bed;From chair/3-in-1 Stand to Sit: 4: Min assist;To bed;To chair/3-in-1 Details for Transfer Assistance: x2. VCs safety, technique, hand placement. assist to rise, stabilize, control descent.  Ambulation/Gait Ambulation/Gait Assistance: 1: +2 Total assist Ambulation/Gait: Patient Percentage: 80% Ambulation Distance (Feet): 15 Feet (x 2. ) Ambulation/Gait Assistance Details: VCs safety, posture, distance from RW. Assist to stabilize/support pt and to maneuver with RW. Fatigues very easily. Increased anxiety. +2 for safety, line management. Gait Pattern: Step-through pattern;Decreased stride length;Decreased step  length - right;Decreased step length - left;Trunk flexed;Narrow base of support    Exercises General Exercises - Lower Extremity Long Arc Quad: AROM;Both;10 reps;Seated   PT Diagnosis:    PT Problem List:   PT Treatment Interventions:     PT Goals Acute Rehab PT Goals Pt will go Supine/Side to Sit: with supervision PT Goal: Supine/Side to Sit - Progress: Updated due to goal met Pt will go Sit to Supine/Side: with supervision PT Goal: Sit to Supine/Side - Progress: Updated due to goal met Pt will go Sit to Stand: with supervision PT Goal: Sit to Stand - Progress: Updated due to goal met Pt will go Stand to Sit: with supervision PT Goal: Stand to Sit - Progress: Updated due to goals met Pt will Transfer Bed to Chair/Chair to Bed: with min assist PT Transfer Goal: Bed to Chair/Chair to Bed - Progress: Progressing toward goal Pt will Ambulate: 16 - 50 feet;with min assist;with rolling walker PT Goal: Ambulate - Progress: Progressing toward goal  Visit Information  Last PT Received On: 01/20/12 Assistance Needed: +1 PT/OT Co-Evaluation/Treatment: Yes    Subjective Data  Subjective: "I feel weak" Patient Stated Goal: Get better. Less pain   Cognition  Overall Cognitive Status: Appears within functional limits for tasks assessed/performed Arousal/Alertness: Awake/[name] Orientation Level: Appears intact for tasks assessed Behavior During Session: [facility] for tasks performed    Balance     End of Session PT - End of Session Activity Tolerance: Patient limited by fatigue;Patient limited by pain Patient left: in chair;with call bell/phone within reach   GP     [name] [facility] 01/20/2012, 1:28 PM [phone]

## 2012-01-20 NOTE — Progress Notes (Signed)
Start clears continue supportive care.

## 2012-01-20 NOTE — Progress Notes (Signed)
14 Days Post-Op  Subjective: Complains of being sore, but is getting up to use bedside commode.  Objective: Vital signs in last 24 hours: Temp:  [98 F (36.7 C)-99.4 F (37.4 C)] 98.4 F (36.9 C) (10/02 0500) Pulse Rate:  [87-97] 87  (10/02 0500) Resp:  [13-18] 18  (10/02 0500) BP: (97-108)/(48-69) 104/60 mmHg (10/02 0500) SpO2:  [90 %-98 %] 96 % (10/02 0500) Last BM Date: 01/18/12  2 stools, afebrile, VSS, WBC improving, surgical drains not recorded completely.  The one on the right is still cloudy white, the one on the  Left is a little clearer.  Her new perc drain has about 75-100 ml in bag, 300 ml from drain placement. Afebrile, VSS, WBC down some. H/H stable.  Intake/Output from previous day: 10/01 0701 - 10/02 0700 In: 2308.3 [I.V.:278.3; IV Piggyback:150; TPN:1680] Out: 630 [Urine:600; Drains:30] Intake/Output this shift:    General appearance: alert, cooperative and no distress Resp: clear to auscultation bilaterally GI: soft, +BS, +BM, tender as always, more so now with new drain.  Wound vac looked good on Monday.  Lab Results:   [facility] 01/20/12 0450 01/19/12 0451  WBC 13.9* 16.8*  HGB 7.2* 7.7*  HCT 23.0* 24.4*  PLT 538* 623*    BMET  Basename 01/20/12 0450 01/19/12 0451  NA 132* 132*  K 3.7 3.6  CL 101 101  CO2 22 22  GLUCOSE 104* 108*  BUN 15 13  CREATININE 0.48* 0.49*  CALCIUM 8.1* 8.2*   PT/INR No results found for this basename: LABPROT:2,INR:2 in the last 72 hours   Lab 01/20/12 0450 01/19/12 0451 01/18/12 0510 01/15/12 0508 01/14/12 0500  AST 28 29 28 28 29   ALT 13 16 17 27  35  ALKPHOS 101 96 101 84 96  BILITOT 0.3 0.4 0.4 0.3 0.2*  PROT 6.4 6.8 6.3 5.8* 6.0  ALBUMIN 1.7* 1.8* 1.6* 1.4* 1.5*     Lipase     Component Value Date/Time   LIPASE 113* 01/11/2012 0540     Studies/Results: Ct Guided Abscess Drain  01/19/2012  *RADIOLOGY REPORT*  CT GUIDED ABSCESS DRAIN PLACEMENT  Date: 01/19/2012  Clinical History: History of necrotic  pancreatitis 10 days status post exploratory laparotomy, pancreatic necrosectomy, and placement of to abdominal drains.  She now has a large peripherally enhancing loculated peritoneal fluid collection concerning for abscess. Percutaneous drain is requested.  Procedures Performed: 1. CT guided drain placement  Interventional Radiologist:  Sterling Big, MD  Sedation: Moderate (conscious) sedation was used.  4 mg Versed, 200 mcg Fentanyl were administered intravenously.  The patient's vital signs were monitored continuously by radiology nursing throughout the procedure.  Sedation Time: 25 minutes  PROCEDURE/FINDINGS:   Informed consent was obtained from the patient following explanation of the procedure, risks, benefits and alternatives. The patient understands, agrees and consents for the procedure. All questions were addressed. A time out was performed.  Maximal barrier sterile technique utilized including caps, mask, sterile gowns, sterile gloves, large sterile drape, hand hygiene, and betadine skin prep.  A planning axial CT scan was performed.  The large right hemi abdominal peritoneal fluid collection was identified.  An appropriate skin entry site was selected and marked.  Local anesthesia was achieved with infiltration of 1% lidocaine.  Using CT guidance, the fluid collection was punctured with an 18 gauge trocar needle.  A guide wire was advanced caudally within the fluid collection down into the anatomic pelvis.  The tract was then serially dilated to 54 [place] and  a 12-French Cook all-purpose drainage catheter was advanced over the wire and positioned within the anatomic pelvis.  The 12-French Cook drainage catheter was modified with multiple additional side holes covering approximately 20 cm of its length to facilitate complete drainage of the fluid collection and prevent loculation into pockets. Approximately 300 ml of purulent fluid was initially aspirated. The tube was connected to gravity  drainage and secured to the skin with both Prolene suture and a plastic bumper.  The patient tolerated the procedure well, there was no immediate complication. She was returned to her room in stable condition.  A sample material was sent for culture.  IMPRESSION:  Successful placement of a 11 French Cook all-purpose drainage catheter modified with additional side holes into the right abdominal peritoneal abscess.  Approximately 300 ml of purulent material was aspirated.  A sample was sent for culture.  Signed,  Sterling Big, MD Vascular & Interventional Radiologist [facility] Radiology   Original Report Authenticated By: Threasa Beards Abdomen Pelvis W Contrast  01/18/2012  *RADIOLOGY REPORT*  Clinical Data: Acute pancreatitis.  Epigastric pain with fever. Pancreatic pseudocyst.  CT ABDOMEN AND PELVIS WITH CONTRAST  Technique:  Multidetector CT imaging of the abdomen and pelvis was performed following the standard protocol during bolus administration of intravenous contrast.  Contrast: OMNIPAQUE IOHEXOL 300 MG/ML  SOLN  Comparison: CT scan dated 01/02/2012  Findings: The patient has significantly increased moderate bilateral pleural effusions with secondary atelectasis at the lung bases.  Heart size is normal. No change in the large hiatal hernia.  Two pseudocyst drainage catheters are present in the upper abdomen. There has been slight decrease in the fluid along the left pericolic gutter.  The patient now has increased enhancement of the margins of the loculated fluid in the abdomen and pelvis.  There is a fluid collection which extends from the overlying the top of the right lobe of the liver down the right pericolic gutter into the pelvis.  This collection measures 35 x 17 x 5.3 cm.  The drainage catheter in the left upper quadrant is within the left upper quadrant recess at this fluid collection that I suspect that the fluid with draining pattern with a midline pelvic catheter.  There is a small  amount of residual pancreatic fluid around the mid abdominal catheter.  This has decreased.  The liver, pancreas, adrenal glands, and kidneys are normal and stable.  Small pancreatic pseudocyst in the head of the pancreas is better defined measures 16 mm.  No dilated bowel.  Uterus and ovaries are normal.  No acute osseous abnormality.  IMPRESSION:  1.  The patient has developed an enhancing rim around the extensive loculated fluid in both sides of the abdomen consistent with a pseudocyst.  The most prominent portion of the collection is in the right side of the abdomen and the pelvis.  The draining in the left upper quadrant is within the recess at this collection but I suspect another drain in the pelvis might be useful. 2.  Slightly better definition of the small pseudocyst in the head of the pancreas. 3.  Interval development of moderate bilateral pleural effusions.   Original Report Authenticated By: Gwynn Burly, M.D.     Medications:    . antiseptic oral rinse  15 mL Mouth Rinse QID  . chlorhexidine  15 mL Mouth Rinse BID  . enoxaparin (LOVENOX) injection  40 mg Subcutaneous Q24H  . folic acid  1 mg Intravenous Daily  .  insulin aspart  2-6 Units Subcutaneous Q6H  . LORazepam  1 mg Intravenous Q8H  . metronidazole  500 mg Intravenous Q8H  . octreotide  100 mcg Subcutaneous Q8H  . pantoprazole (PROTONIX) IV  40 mg Intravenous Q12H  . piperacillin-tazobactam (ZOSYN)  IV  3.375 g Intravenous Q8H    Assessment/Plan Ruptured pseudocyst with peritonitis, s/p Exploratory laparotomy with debridement of necrotic pancreas and drainage of the upper abdomen 01/03/12 Dr. Wenda Low.  Post op drainage from pseudocyst  WBC still up with intraabdominal fluid collection on CT 01/18/12, for IR drain 01/19/12. 300 ml purulent drainage. Post op VDRF on Vent, extubated 01/11/12  Anemia H/H= 7.3/22.3  Hx of pancreatitis, and alcohol abuse.  GERD  Elevated liver function tests  GERD (gastroesophageal  reflux disease)  Hiatal Hernia  9/18 EGD ([facility]) - 10-12 mm ulcer in the cardia of the stomach, protonix gtt, removed panda  Day 17 flagyl, started 9/15  Day 21 Zosyn, started 9/11    Plan:  Continue drains, TNA, starts sips of clears, I will discuss FE+ IV with Dr. Luisa Hart, vs transfusion she's almost down to 7.0   LOS: 25 days    Jamin Panther 01/20/2012

## 2012-01-20 NOTE — Progress Notes (Signed)
14 Days Post-Op  Subjective: Pt doing ok; still has some abd pain at drain site   Objective: Vital signs in last 24 hours: Temp:  [98 F (36.7 C)-99.4 F (37.4 C)] 98.4 F (36.9 C) (10/02 0500) Pulse Rate:  [87-97] 87  (10/02 0500) Resp:  [13-18] 18  (10/02 0500) BP: (97-108)/(48-69) 104/60 mmHg (10/02 0500) SpO2:  [90 %-98 %] 96 % (10/02 0500) Last BM Date: 01/18/12  Intake/Output from previous day: 10/01 0701 - 10/02 0700 In: 2308.3 [I.V.:278.3; IV Piggyback:150; TPN:1680] Out: 630 [Urine:600; Drains:30] Intake/Output this shift: Total I/O In: -  Out: 450 [Urine:450]  Right abd/perit drain intact, output 30 cc's today turbid yellow fluid, cx's pend, insertion site ok; drain flushed with 5 cc's sterile NS with little return  Lab Results:   [facility] 01/20/12 0450 01/19/12 0451  WBC 13.9* 16.8*  HGB 7.2* 7.7*  HCT 23.0* 24.4*  PLT 538* 623*   BMET  Basename 01/20/12 0450 01/19/12 0451  NA 132* 132*  K 3.7 3.6  CL 101 101  CO2 22 22  GLUCOSE 104* 108*  BUN 15 13  CREATININE 0.48* 0.49*  CALCIUM 8.1* 8.2*   PT/INR No results found for this basename: LABPROT:2,INR:2 in the last 72 hours ABG No results found for this basename: PHART:2,PCO2:2,PO2:2,HCO3:2 in the last 72 hours Recent Results (from the past 240 hour(s))  ANAEROBIC CULTURE     Status: Normal (Preliminary result)   Collection Time   01/19/12  2:00 PM      Component Value Range Status Comment   Specimen Description PERITONEAL CAVITY   Final    Special Requests Normal   Final    Gram Stain     Final    Value: FEW WBC PRESENT,BOTH PMN AND MONONUCLEAR     NO ORGANISMS SEEN   Culture PENDING   Incomplete    Report Status PENDING   Incomplete   BODY FLUID CULTURE     Status: Normal (Preliminary result)   Collection Time   01/19/12  2:00 PM      Component Value Range Status Comment   Specimen Description FLUID PERITONEAL CAVITY   Final    Special Requests NONE   Final    Gram Stain     Final    Value: FEW WBC PRESENT,BOTH PMN AND MONONUCLEAR     NO ORGANISMS SEEN   Culture PENDING   Incomplete    Report Status PENDING   Incomplete     Studies/Results: Ct Guided Abscess Drain  01/19/2012  *RADIOLOGY REPORT*  CT GUIDED ABSCESS DRAIN PLACEMENT  Date: 01/19/2012  Clinical History: History of necrotic pancreatitis 10 days status post exploratory laparotomy, pancreatic necrosectomy, and placement of to abdominal drains.  She now has a large peripherally enhancing loculated peritoneal fluid collection concerning for abscess. Percutaneous drain is requested.  Procedures Performed: 1. CT guided drain placement  Interventional Radiologist:  Sterling Big, MD  Sedation: Moderate (conscious) sedation was used.  4 mg Versed, 200 mcg Fentanyl were administered intravenously.  The patient's vital signs were monitored continuously by radiology nursing throughout the procedure.  Sedation Time: 25 minutes  PROCEDURE/FINDINGS:   Informed consent was obtained from the patient following explanation of the procedure, risks, benefits and alternatives. The patient understands, agrees and consents for the procedure. All questions were addressed. A time out was performed.  Maximal barrier sterile technique utilized including caps, mask, sterile gowns, sterile gloves, large sterile drape, hand hygiene, and betadine skin prep.  A planning axial  CT scan was performed.  The large right hemi abdominal peritoneal fluid collection was identified.  An appropriate skin entry site was selected and marked.  Local anesthesia was achieved with infiltration of 1% lidocaine.  Using CT guidance, the fluid collection was punctured with an 18 gauge trocar needle.  A guide wire was advanced caudally within the fluid collection down into the anatomic pelvis.  The tract was then serially dilated to 12 [place] and a 12-French Cook all-purpose drainage catheter was advanced over the wire and positioned within the anatomic pelvis.  The  12-French Cook drainage catheter was modified with multiple additional side holes covering approximately 20 cm of its length to facilitate complete drainage of the fluid collection and prevent loculation into pockets. Approximately 300 ml of purulent fluid was initially aspirated. The tube was connected to gravity drainage and secured to the skin with both Prolene suture and a plastic bumper.  The patient tolerated the procedure well, there was no immediate complication. She was returned to her room in stable condition.  A sample material was sent for culture.  IMPRESSION:  Successful placement of a 52 French Cook all-purpose drainage catheter modified with additional side holes into the right abdominal peritoneal abscess.  Approximately 300 ml of purulent material was aspirated.  A sample was sent for culture.  Signed,  Sterling Big, MD Vascular & Interventional Radiologist [name] Radiology   Original Report Authenticated By: Threasa Beards Abdomen Pelvis W Contrast  01/18/2012  *RADIOLOGY REPORT*  Clinical Data: Acute pancreatitis.  Epigastric pain with fever. Pancreatic pseudocyst.  CT ABDOMEN AND PELVIS WITH CONTRAST  Technique:  Multidetector CT imaging of the abdomen and pelvis was performed following the standard protocol during bolus administration of intravenous contrast.  Contrast: OMNIPAQUE IOHEXOL 300 MG/ML  SOLN  Comparison: CT scan dated 01/02/2012  Findings: The patient has significantly increased moderate bilateral pleural effusions with secondary atelectasis at the lung bases.  Heart size is normal. No change in the large hiatal hernia.  Two pseudocyst drainage catheters are present in the upper abdomen. There has been slight decrease in the fluid along the left pericolic gutter.  The patient now has increased enhancement of the margins of the loculated fluid in the abdomen and pelvis.  There is a fluid collection which extends from the overlying the top of the right lobe of the  liver down the right pericolic gutter into the pelvis.  This collection measures 35 x 17 x 5.3 cm.  The drainage catheter in the left upper quadrant is within the left upper quadrant recess at this fluid collection that I suspect that the fluid with draining pattern with a midline pelvic catheter.  There is a small amount of residual pancreatic fluid around the mid abdominal catheter.  This has decreased.  The liver, pancreas, adrenal glands, and kidneys are normal and stable.  Small pancreatic pseudocyst in the head of the pancreas is better defined measures 16 mm.  No dilated bowel.  Uterus and ovaries are normal.  No acute osseous abnormality.  IMPRESSION:  1.  The patient has developed an enhancing rim around the extensive loculated fluid in both sides of the abdomen consistent with a pseudocyst.  The most prominent portion of the collection is in the right side of the abdomen and the pelvis.  The draining in the left upper quadrant is within the recess at this collection but I suspect another drain in the pelvis might be useful. 2.  Slightly better  definition of the small pseudocyst in the head of the pancreas. 3.  Interval development of moderate bilateral pleural effusions.   Original Report Authenticated By: Gwynn Burly, M.D.     Anti-infectives: Anti-infectives     Start     Dose/Rate Route Frequency Ordered Stop   01/03/12 1600   metroNIDAZOLE (FLAGYL) IVPB 500 mg        500 mg 100 mL/hr over 60 Minutes Intravenous Every 8 hours 01/03/12 1525     12/31/11 1100   vancomycin (VANCOCIN) 750 mg in sodium chloride 0.9 % 150 mL IVPB  Status:  Discontinued        750 mg 150 mL/hr over 60 Minutes Intravenous Every 12 hours 12/31/11 0958 01/03/12 0849   12/30/11 1000  piperacillin-tazobactam (ZOSYN) IVPB 3.375 g       3.375 g 12.5 mL/hr over 240 Minutes Intravenous Every 8 hours 12/30/11 0901            Assessment/Plan: s/p right abd /perit fluid coll drainage 10/1; check final cx's,  monitor labs; cont drain flushes; ? tranfuse; once output declines obtain f/u CT to assess adequacy of drainage; other plans as per CCS.   LOS: 25 days    ALLRED,D [name] 01/20/2012

## 2012-01-21 DIAGNOSIS — D649 Anemia, unspecified: Secondary | ICD-10-CM | POA: Diagnosis present

## 2012-01-21 LAB — COMPREHENSIVE METABOLIC PANEL
ALT: 12 U/L (ref 0–35)
AST: 23 U/L (ref 0–37)
Albumin: 1.7 g/dL — ABNORMAL LOW (ref 3.5–5.2)
Calcium: 8.1 mg/dL — ABNORMAL LOW (ref 8.4–10.5)
GFR calc Af Amer: >90 mL/min (ref >90)
Sodium: 134 mEq/L — ABNORMAL LOW (ref 135–145)
Total Protein: 6.6 g/dL (ref 6.0–8.3)

## 2012-01-21 LAB — CBC
Hemoglobin: 7.7 g/dL — ABNORMAL LOW (ref 12.0–15.0)
MCHC: 30.8 g/dL (ref 30.0–36.0)
RDW: 18.4 % — ABNORMAL HIGH (ref 11.5–15.5)

## 2012-01-21 LAB — GLUCOSE, CAPILLARY: Glucose-Capillary: 125 mg/dL — ABNORMAL HIGH (ref 70–99)

## 2012-01-21 LAB — PREPARE RBC (CROSSMATCH)

## 2012-01-21 MED ORDER — FUROSEMIDE 10 MG/ML IJ SOLN
20.0000 mg | Freq: Once | INTRAMUSCULAR | Status: AC
  Administered 2012-01-21: 20 mg via INTRAVENOUS
  Filled 2012-01-21: qty 2

## 2012-01-21 MED ORDER — CLINIMIX E/DEXTROSE (5/15) 5 % IV SOLN
INTRAVENOUS | Status: AC
  Administered 2012-01-21: 17:00:00 via INTRAVENOUS
  Filled 2012-01-21: qty 2000

## 2012-01-21 NOTE — Progress Notes (Signed)
Subjective: Pt ok, feeling some better today. C/o occasional soreness with 'all drains'  Objective: Physical Exam: BP 104/57  Pulse 87  Temp 98.6 F (37 C) (Oral)  Resp 18  Ht 5\' 6"  (1.676 m)  Wt 166 lb 10.7 oz (75.6 kg)  BMI 26.90 kg/m2  SpO2 95%  LMP 12/08/2006  Temp to 101.2 last pm Rt abd drain(IR) intact, site clean, mildly sore at suture. Output cloudy yellow, 150cc recorded yesterday, about 15cc in bag now.   Labs: CBC  Basename 01/20/12 0450 01/19/12 0451  WBC 13.9* 16.8*  HGB 7.2* 7.7*  HCT 23.0* 24.4*  PLT 538* 623*   BMET  Basename 01/21/12 0500 01/20/12 0450  NA 134* 132*  K 3.7 3.7  CL 102 101  CO2 24 22  GLUCOSE 110* 104*  BUN 12 15  CREATININE 0.52 0.48*  CALCIUM 8.1* 8.1*   LFT  Basename 01/21/12 0500  PROT 6.6  ALBUMIN 1.7*  AST 23  ALT 12  ALKPHOS 91  BILITOT 0.3  BILIDIR --  IBILI --  LIPASE --   PT/INR No results found for this basename: LABPROT:2,INR:2 in the last 72 hours   Studies/Results: Ct Guided Abscess Drain  01/19/2012  *RADIOLOGY REPORT*  CT GUIDED ABSCESS DRAIN PLACEMENT  Date: 01/19/2012  Clinical History: History of necrotic pancreatitis 10 days status post exploratory laparotomy, pancreatic necrosectomy, and placement of to abdominal drains.  She now has a large peripherally enhancing loculated peritoneal fluid collection concerning for abscess. Percutaneous drain is requested.  Procedures Performed: 1. CT guided drain placement  Interventional Radiologist:  Sterling Big, MD  Sedation: Moderate (conscious) sedation was used.  4 mg Versed, 200 mcg Fentanyl were administered intravenously.  The patient's vital signs were monitored continuously by radiology nursing throughout the procedure.  Sedation Time: 25 minutes  PROCEDURE/FINDINGS:   Informed consent was obtained from the patient following explanation of the procedure, risks, benefits and alternatives. The patient understands, agrees and consents for the  procedure. All questions were addressed. A time out was performed.  Maximal barrier sterile technique utilized including caps, mask, sterile gowns, sterile gloves, large sterile drape, hand hygiene, and betadine skin prep.  A planning axial CT scan was performed.  The large right hemi abdominal peritoneal fluid collection was identified.  An appropriate skin entry site was selected and marked.  Local anesthesia was achieved with infiltration of 1% lidocaine.  Using CT guidance, the fluid collection was punctured with an 18 gauge trocar needle.  A guide wire was advanced caudally within the fluid collection down into the anatomic pelvis.  The tract was then serially dilated to 12 [place] and a 12-French Cook all-purpose drainage catheter was advanced over the wire and positioned within the anatomic pelvis.  The 12-French Cook drainage catheter was modified with multiple additional side holes covering approximately 20 cm of its length to facilitate complete drainage of the fluid collection and prevent loculation into pockets. Approximately 300 ml of purulent fluid was initially aspirated. The tube was connected to gravity drainage and secured to the skin with both Prolene suture and a plastic bumper.  The patient tolerated the procedure well, there was no immediate complication. She was returned to her room in stable condition.  A sample material was sent for culture.  IMPRESSION:  Successful placement of a 65 French Cook all-purpose drainage catheter modified with additional side holes into the right abdominal peritoneal abscess.  Approximately 300 ml of purulent material was aspirated.  A sample was sent  for culture.  Signed,  Sterling Big, MD Vascular & Interventional Radiologist [facility] Radiology   Original Report Authenticated By: Vilma Prader     Assessment/Plan: s/p right abd /perit fluid coll drainage 10/1 Cx pending Once output declines, repeat imaging to reassess Will follow along.    LOS: 26  days    Brayton El PA-C 01/21/2012 9:41 AM

## 2012-01-21 NOTE — Progress Notes (Signed)
PARENTERAL NUTRITION CONSULT NOTE - FOLLOW UP  Pharmacy Consult for TNA Indication: Severe Pancreatitis  Allergies  Allergen Reactions  . Ciprofloxacin Nausea Only    REACTION: nausea   Patient Measurements: Height: 5\' 6"  (167.6 cm) Weight: 166 lb 10.7 oz (75.6 kg) IBW/kg (Calculated) : 59.3   Vital Signs: Temp: 98.6 F (37 C) (10/03 0600) Temp src: Oral (10/03 0600) BP: 104/57 mmHg (10/03 0600) Pulse Rate: 87  (10/03 0600) Intake/Output from previous day: 10/02 0701 - 10/03 0700 In: 2838.7 [P.O.:720; TPN:2118.7] Out: 635 [Urine:450; Drains:185] Intake/Output from this shift:    Labs:  [facility] 01/20/12 0450 01/19/12 0451  WBC 13.9* 16.8*  HGB 7.2* 7.7*  HCT 23.0* 24.4*  PLT 538* 623*  APTT -- --  INR -- --    Basename 01/21/12 0500 01/20/12 0450 01/19/12 0451  NA 134* 132* 132*  K 3.7 3.7 3.6  CL 102 101 101  CO2 24 22 22   GLUCOSE 110* 104* 108*  BUN 12 15 13   CREATININE 0.52 0.48* 0.49*  LABCREA -- -- --  CREAT24HRUR -- -- --  CALCIUM 8.1* 8.1* 8.2*  MG 2.0 1.8 --  PHOS 3.5 -- --  PROT 6.6 6.4 6.8  ALBUMIN 1.7* 1.7* 1.8*  AST 23 28 29   ALT 12 13 16   ALKPHOS 91 101 96  BILITOT 0.3 0.3 0.4  BILIDIR -- -- --  IBILI -- -- --  PREALBUMIN -- -- --  TRIG -- -- --  CHOLHDL -- -- --  CHOL -- -- --   Estimated Creatinine Clearance: 81.6 ml/min (by C-G formula based on Cr of 0.52).    Basename 01/21/12 0410 01/20/12 2258 01/20/12 1744  GLUCAP 128* 130* 160*   Insulin Requirements in the past 24 hours:   CBGs 111-160, required 8 units of SSI last 24h  Nutritional Goals:   RD recs 9/16: 1762 kcal, 86-95 grams protein  Reestimated s/p extubation 9/23: 1475-1775 kcal, 94-110 grams protein  Clinimix 5/15 @ 10ml/hr + Lipid 20% MWF will provide 96 g protein/day, 1843 kCal on lipid days, 1363 Kcal on non-lipid days for an average of 1569 Kcal per day/week.  Current nutrition:   Diet: Clear Liquid started 10/3 AM  IVF: NS @ KVO  Clinimix E 5/15 @  80 ml/hr  Assessment:   [name] with h/o recurrent pancreatitis and ETOH abuse, s/p ERCP with sphincterotomy on 10/06/2011 here this admit with acute pancreatitis, developed ileus. Rapid response called 9/12 for respiratory decline and lethargy - pt transfer to ICU and CCM consulted. Intubated 9/12. 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   Pt underwent exploratory laparotomy with debridement of necrotic pancreas and drainage of infected, ruptured pseudocyst 9/15. TNA started 9/16.   9/30 CT abd = extensive loculated fluid in both sides of the abdomen consistent with a pseudocyst.  Additional drained (3rd) placed by IR 10/1, total drains output = 185 ml. Plan to obtain f/u CT once output declines.    Diet advanced to clear liquids  Renal: Scr wnl/stable Hepatic fxn: AST/ALT previously elevated ?shock liver 2/2 pancreatitis. Now WNL Lytes: Na+ slightly low but unable to adjust in TNA, Corrected Calcium wnl Pre-Alb: improving, 2.3 (9/17), 12.2 (9/23), 11.4 (9/30) TG/Cholesterol: WNL on  9/17, 9/23, 9/30 CBGs: ok on SSI  Plan:    Continue clinimix to E 5/15 at 80 ml/hr, latest prealbumin reveals maintaining protein stores (level remains mod decreased)  PO advancement per CCS, will  monitor  TNA to contain IV fat emulsion, standard multivitamins and trace elements only on MWF only due to ongoing shortage   TNA labs Monday/Thursdays  Pharmacy will follow up   Geoffry Paradise, PharmD, BCPS Pager: [phone] 8:02 AM Pharmacy #: [phone]

## 2012-01-21 NOTE — Progress Notes (Signed)
Subjective: Patient states that she feels better today. In general she appears to have much more energy today and has been up several times at the nurses ambulating around the room.  Interval history: Patient continues to be anemic with a hemoglobin down to 7.7. Blood has been ordered by general surgeon the patient is presently receiving blood.   Objective: Filed Vitals:   01/21/12 0600 01/21/12 1610 01/21/12 1641 01/21/12 1717  BP: 104/57 99/63 102/65 100/63  Pulse: 87 87 86 85  Temp: 98.6 F (37 C) 98.8 F (37.1 C) 99.1 F (37.3 C) 98.9 F (37.2 C)  TempSrc: Oral Oral Oral Oral  Resp: 18 20 20 20   Height:      Weight:      SpO2: 95% 94%     Weight change:   Intake/Output Summary (Last 24 hours) at 01/21/12 1821 Last data filed at 01/21/12 1717  Gross per 24 hour  Intake 3648.67 ml  Output     35 ml  Net 3613.67 ml    General: Alert, awake, oriented x3, in no acute distress.  HEENT: [place]/AT PEERL, EOMI Heart: Regular rate and rhythm.  Lungs: Clear to auscultation Abdomen: Mildly distended, positive bowel sounds. And won't with good granulation. Midline laparotomy incision with 2 JP drains and drainage. Neuro: No focal neurological deficits noted. Musculoskeletal: No warm swelling or erythema around joints, no spinal tenderness noted.    Lab Results:  Basename 01/21/12 0500 01/20/12 0450  NA 134* 132*  K 3.7 3.7  CL 102 101  CO2 24 22  GLUCOSE 110* 104*  BUN 12 15  CREATININE 0.52 0.48*  CALCIUM 8.1* 8.1*  MG 2.0 1.8  PHOS 3.5 --    Basename 01/21/12 0500 01/20/12 0450  AST 23 28  ALT 12 13  ALKPHOS 91 101  BILITOT 0.3 0.3  PROT 6.6 6.4  ALBUMIN 1.7* 1.7*   No results found for this basename: LIPASE:2,AMYLASE:2 in the last 72 hours  Basename 01/21/12 0950 01/20/12 0450  WBC 13.3* 13.9*  NEUTROABS -- --  HGB 7.7* 7.2*  HCT 25.0* 23.0*  MCV 90.3 90.2  PLT 568* 538*   No results found for this basename: CKTOTAL:3,CKMB:3,CKMBINDEX:3,TROPONINI:3 in  the last 72 hours No components found with this basename: POCBNP:3 No results found for this basename: DDIMER:2 in the last 72 hours No results found for this basename: HGBA1C:2 in the last 72 hours No results found for this basename: CHOL:2,HDL:2,LDLCALC:2,TRIG:2,CHOLHDL:2,LDLDIRECT:2 in the last 72 hours No results found for this basename: TSH,T4TOTAL,FREET3,T3FREE,THYROIDAB in the last 72 hours No results found for this basename: VITAMINB12:2,FOLATE:2,FERRITIN:2,TIBC:2,IRON:2,RETICCTPCT:2 in the last 72 hours  Micro Results: Recent Results (from the past 240 hour(s))  ANAEROBIC CULTURE     Status: Normal (Preliminary result)   Collection Time   01/19/12  2:00 PM      Component Value Range Status Comment   Specimen Description PERITONEAL CAVITY   Final    Special Requests Normal   Final    Gram Stain     Final    Value: FEW WBC PRESENT,BOTH PMN AND MONONUCLEAR     NO ORGANISMS SEEN   Culture     Final    Value: NO ANAEROBES ISOLATED; CULTURE IN PROGRESS FOR 5 DAYS   Report Status PENDING   Incomplete   BODY FLUID CULTURE     Status: Normal (Preliminary result)   Collection Time   01/19/12  2:00 PM      Component Value Range Status Comment   Specimen Description  FLUID PERITONEAL CAVITY   Final    Special Requests NONE   Final    Gram Stain     Final    Value: FEW WBC PRESENT,BOTH PMN AND MONONUCLEAR     NO ORGANISMS SEEN   Culture NO GROWTH 1 DAY   Final    Report Status PENDING   Incomplete     Studies/Results: Ct Abdomen Pelvis Wo Contrast  12/30/2011  *RADIOLOGY REPORT*  Clinical Data: Abdominal pain.  Nausea and vomiting.  Elevated liver function tests.  Follow-up acute pancreatitis.  CT ABDOMEN AND PELVIS WITHOUT CONTRAST  Technique:  Multidetector CT imaging of the abdomen and pelvis was performed following the standard protocol without intravenous contrast.  Comparison: 12/27/2011  Findings: New small bilateral pleural effusions and bibasilar atelectasis or infiltrates are  seen.  A moderate size hiatal hernia is again demonstrated.  Moderate ascites within the abdomen and pelvis has significantly increased since previous study.  New diffuse body wall edema also noted.  A persistent small focal fluid collection is seen between the posterior wall the gastric antrum and the pancreatic neck which measures approximately 2 x 3 cm and is not significantly changed. This is suspicious for a small pancreatic pseudocyst.  Swelling of the pancreatic head and peripancreatic inflammatory changes remain consistent with acute pancreatitis, but show no significant change.  Surgical clips again seen from prior cholecystectomy.  No definite evidence of biliary dilatation. Liver, spleen, adrenal glands, and kidneys are unremarkable in appearance on this noncontrast study.  No soft tissue masses or lymphadenopathy identified.  Sigmoid diverticulosis is again demonstrated, however there is no evidence of diverticulitis.  Foley catheter is now seen within the bladder which is collapsed.  Increased colonic ileus pattern is demonstrated.  IMPRESSION:  1. No significant change in appearance of the acute pancreatitis involving the pancreatic head and neck with probable 3 cm pseudocyst along the posterior wall of gastric antrum. 2.  Significant increase in moderate ascites and diffuse body wall edema since prior study.  New bilateral pleural effusions and bibasilar atelectasis or infiltrates also noted. 3.  Worsening colonic ileus pattern. 4.  Moderate hiatal hernia.   Original Report Authenticated By: Danae Orleans, M.D.    Dg Chest 2 View  12/30/2011  *RADIOLOGY REPORT*  Clinical Data: Low grade temperature and wheezes.  CHEST - 2 VIEW  Comparison: 03/24/2011  Findings: Two views of the chest demonstrate bibasilar densities suggestive for pleural fluid.  Prominent interstitial markings may represent mild edema.  There are increased densities in the right lung and cannot exclude airspace disease.  Heart size  appears to be upper limits of normal.  Negative for a pneumothorax.  IMPRESSION: There is evidence for bilateral pleural effusions and cannot exclude mild interstitial edema.  Patchy densities in the right lung may represent asymmetric edema versus infection.   Original Report Authenticated By: Richarda Overlie, M.D.    Dg Abd 1 View  01/06/2012  *RADIOLOGY REPORT*  Clinical Data: Evaluate pending tube placement  ABDOMEN - 1 VIEW  Comparison: Portable abdomen 01/05/2012  Findings: The feeding tube tip is in the distal body - proximal antrum of the stomach.  The bowel gas pattern is nonspecific. Contrast is noted scattered throughout the nondistended colon.  IMPRESSION: Feeding tube tip in distal body - proximal antrum of the stomach.   Original Report Authenticated By: Juline Patch, M.D.    Dg Abd 1 View  01/05/2012  *RADIOLOGY REPORT*  Clinical Data: Evaluate feeding tube position.  ABDOMEN - 1 VIEW  Comparison: 01/05/2012 fluoroscopic image  Findings: KUB following administration of Omnipaque contrast via the patient's feeding tube demonstrates the tube tip to now be positioned within the mid to distal stomach.  However, the administered contrast is noted to reflux proximally rather than continue into the duodenum.  There is residual contrast within the colon from a prior examination.  Mild bibasilar opacities/pleural effusions are noted.  Surgical clips right upper quadrant.  IMPRESSION: Feeding tube tip has migrated distally, now located within the mid/distal stomach.  However, administered contrast is noted to reflux into the more proximal stomach rather than continue into the duodenum. Therefore, I would not recommend tube feeds unless the tube tip has passed the gastroduodenal junction. Recommend repeat radiograph in the a.m. to evaluate for tube tip position at that time.   Original Report Authenticated By: Waneta Martins, M.D.    Ct Guided Abscess Drain  01/19/2012  *RADIOLOGY REPORT*  CT GUIDED  ABSCESS DRAIN PLACEMENT  Date: 01/19/2012  Clinical History: History of necrotic pancreatitis 10 days status post exploratory laparotomy, pancreatic necrosectomy, and placement of to abdominal drains.  She now has a large peripherally enhancing loculated peritoneal fluid collection concerning for abscess. Percutaneous drain is requested.  Procedures Performed: 1. CT guided drain placement  Interventional Radiologist:  Sterling Big, MD  Sedation: Moderate (conscious) sedation was used.  4 mg Versed, 200 mcg Fentanyl were administered intravenously.  The patient's vital signs were monitored continuously by radiology nursing throughout the procedure.  Sedation Time: 25 minutes  PROCEDURE/FINDINGS:   Informed consent was obtained from the patient following explanation of the procedure, risks, benefits and alternatives. The patient understands, agrees and consents for the procedure. All questions were addressed. A time out was performed.  Maximal barrier sterile technique utilized including caps, mask, sterile gowns, sterile gloves, large sterile drape, hand hygiene, and betadine skin prep.  A planning axial CT scan was performed.  The large right hemi abdominal peritoneal fluid collection was identified.  An appropriate skin entry site was selected and marked.  Local anesthesia was achieved with infiltration of 1% lidocaine.  Using CT guidance, the fluid collection was punctured with an 18 gauge trocar needle.  A guide wire was advanced caudally within the fluid collection down into the anatomic pelvis.  The tract was then serially dilated to 12 [place] and a 12-French Cook all-purpose drainage catheter was advanced over the wire and positioned within the anatomic pelvis.  The 12-French Cook drainage catheter was modified with multiple additional side holes covering approximately 20 cm of its length to facilitate complete drainage of the fluid collection and prevent loculation into pockets. Approximately 300 ml of  purulent fluid was initially aspirated. The tube was connected to gravity drainage and secured to the skin with both Prolene suture and a plastic bumper.  The patient tolerated the procedure well, there was no immediate complication. She was returned to her room in stable condition.  A sample material was sent for culture.  IMPRESSION:  Successful placement of a 56 French Cook all-purpose drainage catheter modified with additional side holes into the right abdominal peritoneal abscess.  Approximately 300 ml of purulent material was aspirated.  A sample was sent for culture.  Signed,  Sterling Big, MD Vascular & Interventional Radiologist [facility]   Original Report Authenticated By: Threasa Beards Abdomen Pelvis W Contrast  01/18/2012  *RADIOLOGY REPORT*  Clinical Data: Acute pancreatitis.  Epigastric pain with fever. Pancreatic pseudocyst.  CT ABDOMEN AND PELVIS WITH CONTRAST  Technique:  Multidetector CT imaging of the abdomen and pelvis was performed following the standard protocol during bolus administration of intravenous contrast.  Contrast: OMNIPAQUE IOHEXOL 300 MG/ML  SOLN  Comparison: CT scan dated 01/02/2012  Findings: The patient has significantly increased moderate bilateral pleural effusions with secondary atelectasis at the lung bases.  Heart size is normal. No change in the large hiatal hernia.  Two pseudocyst drainage catheters are present in the upper abdomen. There has been slight decrease in the fluid along the left pericolic gutter.  The patient now has increased enhancement of the margins of the loculated fluid in the abdomen and pelvis.  There is a fluid collection which extends from the overlying the top of the right lobe of the liver down the right pericolic gutter into the pelvis.  This collection measures 35 x 17 x 5.3 cm.  The drainage catheter in the left upper quadrant is within the left upper quadrant recess at this fluid collection that I suspect that the fluid  with draining pattern with a midline pelvic catheter.  There is a small amount of residual pancreatic fluid around the mid abdominal catheter.  This has decreased.  The liver, pancreas, adrenal glands, and kidneys are normal and stable.  Small pancreatic pseudocyst in the head of the pancreas is better defined measures 16 mm.  No dilated bowel.  Uterus and ovaries are normal.  No acute osseous abnormality.  IMPRESSION:  1.  The patient has developed an enhancing rim around the extensive loculated fluid in both sides of the abdomen consistent with a pseudocyst.  The most prominent portion of the collection is in the right side of the abdomen and the pelvis.  The draining in the left upper quadrant is within the recess at this collection but I suspect another drain in the pelvis might be useful. 2.  Slightly better definition of the small pseudocyst in the head of the pancreas. 3.  Interval development of moderate bilateral pleural effusions.   Original Report Authenticated By: Gwynn Burly, M.D.    Ct Abdomen Pelvis W Contrast  01/08/2012  *RADIOLOGY REPORT*  Clinical Data: Follow-up pancreatitis and abdominal and pelvic fluid collections.  CT ABDOMEN AND PELVIS WITH CONTRAST  Technique:  Multidetector CT imaging of the abdomen and pelvis was performed following the standard protocol during bolus administration of intravenous contrast.  Contrast: OMNIPAQUE IOHEXOL 300 MG/ML  SOLN  Comparison: 09/14/2013and 12/27/2011  Findings: Mild increase in size of small bilateral pleural effusions is seen.  Intraperitoneal drainage catheters are now seen in place, and decreased ascites is seen within the abdomen pelvis since prior exam.  Foley catheter again seen within the bladder which is collapsed.  A small pseudocyst is again seen in the pancreatic head.  This shows mild enlargement compared to recent study 6 days ago, currently measuring 2.3 x 2.4 cm, but is significantly decreased in size compared to earlier  exam on 12/27/2011. No other loculated peri pancreatic fluid collections are identified. There is no evidence of distal pancreatic atrophy or pancreatic ductal dilatation.  Prior cholecystectomy noted.  No evidence of biliary ductal dilatation.  Hepatic steatosis is again demonstrated, without evidence of focal liver mass.  The spleen, adrenal glands, and kidneys are normal in appearance.  No evidence of lymphadenopathy.  Uterus and adnexa are unremarkable.  Sigmoid diverticulosis is noted, however there is no evidence of diverticulitis.  No evidence of bowel obstruction. Diffuse body wall edema  again noted. Moderate hiatal hernia again seen.  IMPRESSION:  1.  Decrease in moderate abdominal and pelvic ascites with intraperitoneal drainage catheters now seen in place.  Mild increase in small bilateral pleural effusions. No significant change in diffuse body wall edema. 2.  Persistent small pseudocyst in the area the pancreatic head, which is decreased in size since earlier exam on 12/27/2011. Continued follow-up by CT is recommended. 3.  Ancillary findings again noted including moderate hiatal hernia, hepatic steatosis, and sigmoid diverticulosis.   Original Report Authenticated By: Danae Orleans, M.D.    Ct Abdomen Pelvis W Contrast  01/02/2012  **ADDENDUM** CREATED: 01/02/2012 17:36:05  I spoke with the critical care physician by telephone, and in further review of the CT images, the gas bubbles which I initially thought were in the pelvic ascites I believe are actually within a completely collapsed urinary bladder which has been displaced anteriorly by the ascites in the pelvis.  Therefore, I do not believe this represents free intraperitoneal air.  **END ADDENDUM** SIGNED BY: Arnell Sieving, M.D.   01/02/2012  *RADIOLOGY REPORT*  Clinical Data: Follow up pancreatitis.  Epigastric abdominal pain. Leukocytosis with white blood cell count 38,800.  CT ABDOMEN AND PELVIS WITH CONTRAST  Technique:  Multidetector  CT imaging of the abdomen and pelvis was performed following the standard protocol during bolus administration of intravenous contrast.  Contrast: OMNIPAQUE IOHEXOL 300 MG/ML. Oral contrast was also administered.  Comparison: CT abdomen and pelvis 12/30/2011, 12/27/2011, 09/27/2011 03/18/2011, 11/27/2010.  Findings: Interval increase in the amount of abdominal and pelvic ascites since the examination 3 days ago, now quite large.  Gas bubbles within the ascitic fluid in the pelvis.  Enhancement of the peritoneum and omentum, with mild thickening.  The fluid has Hounsfield measurement of near 0, indicating simple fluid.  Persistent edema/inflammation surrounding the head of the pancreas, with an approximate 1.8 x 2.6 cm pseudocyst at the junction of the head and body; this is unchanged from the examination 3 days ago and has increased slightly since the June, 2013 examination. Developing pseudocyst in the pancreatic head measuring approximately 1.3 x 2.6 cm, not present on the June, 2013 examination.  Gallbladder surgically absent.  No unexpected biliary ductal dilation.  Mild diffuse hepatic steatosis without focal hepatic parenchymal abnormality.  Normal appearing spleen, adrenal glands, and kidneys.  Moderate to severe aorto-iliac atherosclerosis without aneurysm.  Large hiatal hernia.  Nasogastric tube looped within the intrathoracic portion of the stomach.  Oral contrast material in the distal esophagus.  Upper normal colon or small bowel loops without obstruction.  Gaseous distention of the transverse colon. Sigmoid colon diverticulosis without evidence of acute diverticulitis.  Uterus and ovaries unremarkable by CT.  Phleboliths in both sides of the low pelvis.  Bone window images demonstrate degenerative changes involving the lower thoracic and lumbar spine. Consolidation in the deep posterior lower lobes bilaterally with air bronchograms.  Small left pleural effusion.  Diffuse body wall edema.  Heart  size upper normal.  IMPRESSION:  1. Since the CT 3 days ago, interval increase in the ascites diffusely throughout the abdomen and pelvis.  Gas bubbles are present within the ascitic fluid in the pelvis, presumably from recent tap.  2. Enhancement and mild thickening of the peritoneum and omentum, consistent with inflammation.  The ascitic fluid has a Hounsfield measurement near 0, indicating simple fluid (not hemorrhagic). 3.  Probable acute pancreatitis involving the head of the pancreas. Stable pseudocyst at the junction of the head and  body.  Developing pseudocyst in the head of the pancreas. 4.  Diffuse hepatic steatosis without focal hepatic parenchymal abnormality. 5.  Transverse colon ileus. 6.  Sigmoid colon diverticulosis without evidence of acute diverticulitis. 7.  Large hiatal hernia.  The nasogastric tube is looped within the intrathoracic stomach.  Oral contrast material in the distal esophagus is consistent with reflux. 8.  Bilateral lower lobe atelectasis and/or pneumonia.  Small left pleural effusion. 9.  Age advanced aorto-iliac atherosclerosis. 10.  Anasarca.   Original Report Authenticated By: Arnell Sieving, M.D.    Ct Abdomen Pelvis W Contrast  12/27/2011  *RADIOLOGY REPORT*  Clinical Data: Pain.  Pancreatitis.  White cell count 15.8.  Lipase 177.  Field  CT ABDOMEN AND PELVIS WITH CONTRAST  Technique:  Multidetector CT imaging of the abdomen and pelvis was performed following the standard protocol during bolus administration of intravenous contrast.  Contrast: OMNIPAQUE IOHEXOL 300 MG/ML  SOLN  Comparison: 09/27/2011.  Findings: Atelectasis or infiltration in both lung bases.  Large esophageal hiatal hernia.  There is extensive inflammatory infiltration and edema in the upper abdominal fat surrounding the pancreas and extending into the mesenteric and omental regions.  Changes are consistent with progressing acute pancreatitis.  Peripancreatic fluid collections are again  demonstrated consistent with pseudocyst, similar to previous study.  Mild pancreatic ductal dilatation.  There appears to be homogeneous enhancement of the pancreatic parenchyma without evidence of pancreatic necrosis.  No focal abscess.  Mild intra and extrahepatic bile duct dilatation. Portal and mesenteric vessels appear patent.  Surgical absence of the gallbladder.  The liver, spleen, adrenal glands, kidneys, and retroperitoneal lymph nodes are unremarkable.  Calcification of the abdominal aorta without aneurysm.  The stomach, small bowel, and colon are not distended.  No free air in the abdomen.  Pelvis:  Small amount of free fluid in the pelvis, likely related to inflammatory process.  No loculated fluid collections.  Bladder is decompressed.  The uterus and adnexal structures are not enlarged.  The appendix is normal.  No diverticulitis.  No significant lymphadenopathy in the pelvis.  IMPRESSION: Significant increase in upper abdominal inflammatory changes and edema consistent with progression of acute pancreatitis.  Fluid collections consistent with pseudocyst appears stable.   Original Report Authenticated By: Marlon Pel, M.D.    US Paracentesis  01/03/2012  *RADIOLOGY REPORT*  Clinical Data: Sepsis, pancreatitis, possible additional intra- abdominal process, ascites  ULTRASOUND GUIDED PARACENTESIS  Comparison:  None  An ultrasound guided paracentesis was thoroughly discussed with the patient and questions answered.  The benefits, risks, alternatives and complications were also discussed.  The patient understands and wishes to proceed with the procedure.  Written consent was obtained.  Ultrasound was performed to localize and mark an adequate pocket of fluid in the left lower quadrant of the abdomen. The fluid did appear to have some debris evident.  The area was then prepped and draped in the normal sterile fashion.  1% Lidocaine was used for local anesthesia.  Under ultrasound guidance a 19  gauge Yueh catheter was introduced.  Paracentesis was performed.  The catheter was removed and a dressing applied.  Complications:  None  Findings:  A total of approximately 1.5 liters of turbid brown- green fluid, with free floating debris was removed.  A fluid sample was sent for laboratory analysis.  IMPRESSION: Successful ultrasound guided paracentesis yielding 1.5 liters of ascites. However the fluid is concerning for acute intra-abdominal process. Findings discussed with attending/ordering physician.  Read BY Caryn Bee  Bruning PA-C   Original Report Authenticated By: Donavan Burnet, M.D.    Dg Chest Port 1 View  01/13/2012  *RADIOLOGY REPORT*  Clinical Data: Atelectasis.  PORTABLE CHEST - 1 VIEW  Comparison: 01/12/2012.  Findings: Right IJ central line appears unchanged.  Low lung volumes are present with greater than left basilar atelectasis. Drain projects over the left chest and left upper quadrant. Cardiomediastinal contours are unchanged.  IMPRESSION: No interval change.   Original Report Authenticated By: Andreas Newport, M.D.    Dg Chest Port 1 View  01/12/2012  *RADIOLOGY REPORT*  Clinical Data: Extubation.  PORTABLE CHEST - 1 VIEW  Comparison: 01/11/2012.  Findings: Endotracheal tube removed.  Right IJ central line persists.  Decreased lung volumes are present, expected after extubation.  Persistent bilateral effusions and basilar atelectasis. Cardiomediastinal contours appear unchanged.  IMPRESSION: Interval extubation with expected lower lung volumes.  Right IJ line persists.   Original Report Authenticated By: Andreas Newport, M.D.    Dg Chest Port 1 View  01/11/2012  *RADIOLOGY REPORT*  Clinical Data: Endotracheal tube placement.  PORTABLE CHEST - 1 VIEW  Comparison: 01/09/2012.  Findings: Endotracheal tube tip is 30 mm from the carina.  Lung volumes are lower than on prior exam.  Retrocardiac density is present, retrocardiac density is present likely representing atelectasis. There may be a  small left pleural effusion.  Discoid atelectasis over the right hemidiaphragm.  Right IJ central line is unchanged.  IMPRESSION:  1.  Stable support apparatus. 2.  Increasing bibasilar atelectasis.  Probable small left pleural effusion.   Original Report Authenticated By: Andreas Newport, M.D.    Dg Chest Port 1 View  01/09/2012  *RADIOLOGY REPORT*  Clinical Data: Pneumonia.  Pleural effusions.  PORTABLE CHEST - 1 VIEW  Comparison: 01/08/2012  Findings: Endotracheal tube tip 3.7 cm above the carina.  Right IJ line tip projects over the SVC.  Low lung volumes are present, causing crowding of the pulmonary vasculature.  The patient is rotated to the right on today's exam, resulting in reduced diagnostic sensitivity and specificity. Stable mild atelectasis noted along the right hemidiaphragm. Minimally improved aeration at the left lung base, although retrocardiac airspace opacity persists.  IMPRESSION:  1.  Stable appearance of bibasilar airspace opacity and low lung volumes.  Support apparatus unchanged.   Original Report Authenticated By: Dellia Cloud, M.D.    Dg Chest Port 1 View  01/08/2012  *RADIOLOGY REPORT*  Clinical Data: Pleural effusion.  PORTABLE CHEST - 1 VIEW  Comparison: Chest 01/06/2012 and 01/07/2012.  Findings: Support tubes and lines are unchanged. Left pleural effusion and left worse than right basilar airspace disease appear unchanged.  No pneumothorax identified.  IMPRESSION: No interval change.   Original Report Authenticated By: Bernadene Bell. Maricela Curet, M.D.    Dg Chest Port 1 View  01/07/2012  *RADIOLOGY REPORT*  Clinical Data: Endotracheal tube placement.  PORTABLE CHEST - 1 VIEW  Comparison: Chest 01/06/2012.  Findings: Endotracheal tube is in place with tip in good position at the level of the clavicular heads.  Feeding tube is no longer visualized.  Right IJ catheter remains in place.  Left basilar airspace opacity is unchanged.  Right lung appears clear.  No pneumothorax.   IMPRESSION:  1.  ET tube in good position. 2.  No change in left basilar airspace opacity.   Original Report Authenticated By: Bernadene Bell. Maricela Curet, M.D.    Dg Chest Port 1 View  01/06/2012  *RADIOLOGY REPORT*  Clinical Data: Pleural effusion.  PORTABLE CHEST - 1 VIEW  Comparison: 01/05/2012.  Findings: Patient is rotated to the left.  The endotracheal tube appears similar to the prior exam.  The nasogastric tube has been replaced with a feeding tube and the tip is not visualized.  There is redundant tubing over the thoracic inlet and there may be a coiled loop of the feeding tube in the proximal esophagus or hypopharynx.  Consider retracting and re-advancing the feeding tube for better positioning and to see if the loop produces.  Bibasilar atelectasis remains present. Right IJ central line remains present.  IMPRESSION: 1.  Endotracheal tube and right IJ central line are unchanged. 2.  Exchange of nasogastric tube for feeding tube.  There appears to be a redundant loop in the hypopharynx and upper esophagus however this could be external to the patient as well.  The inspection is recommended. 3.  Persistent bilateral basilar atelectasis and probable left pleural effusion.  This is a call report.   Original Report Authenticated By: Andreas Newport, M.D.    Dg Chest Port 1 View  01/05/2012  *RADIOLOGY REPORT*  Clinical Data: Atelectasis.  Effusions.  PORTABLE CHEST - 1 VIEW  Comparison: Chest 01/03/2012 and 01/04/2012.  Findings: Support tubes and lines are unchanged.  Lung volumes are lower than on the most recent study with increased basilar atelectasis, worse on the left.  No pneumothorax is identified. Heart size is normal.  IMPRESSION: Increased bibasilar atelectasis.  No other change.   Original Report Authenticated By: Bernadene Bell. Maricela Curet, M.D.    Dg Chest Port 1 View  01/04/2012  *RADIOLOGY REPORT*  Clinical Data: Increased heart rate.  Decreased oxygen saturation.  PORTABLE CHEST - 1 VIEW  Comparison:  Plain film chest 01/03/2012.  Findings: Support tubes and lines are unchanged.  No pneumothorax is identified.  Discoid atelectasis in the lung bases is more notable on the right.  The lungs are otherwise clear.  IMPRESSION:  1.  Support apparatus in good position. 2.  Bibasilar atelectasis.   Original Report Authenticated By: Bernadene Bell. Maricela Curet, M.D.    Dg Chest Port 1 View  01/03/2012  *RADIOLOGY REPORT*  Clinical Data: Acute respiratory distress syndrome, endotracheal tube placement.  PORTABLE CHEST - 1 VIEW  Comparison: January 02, 2012.  Findings: Cardiomediastinal silhouette appears normal. Endotracheal tube is in grossly good position with tip several centimeters above the carina.  Nasogastric tube tip is coiled within proximal stomach.  Right internal jugular catheter line is unchanged in position. Left basilar opacity is unchanged compared to prior exam.  No change is noted in mild bilateral pleural effusions.  IMPRESSION: No change compared to prior exam.   Original Report Authenticated By: Venita Sheffield., M.D.    Dg Chest Port 1 View  01/02/2012  *RADIOLOGY REPORT*  Clinical Data: Endotracheal tube  PORTABLE CHEST - 1 VIEW  Comparison: Yesterday  Findings: Endotracheal tube tip 30.0 cm from the carina.  Lungs markedly under aerated with increasing bibasilar atelectasis and/or airspace disease.  Pleural effusions suspected.  Right internal jugular vein center venous catheter stable.  No pneumothorax.  IMPRESSION: Increasing bibasilar atelectasis verses airspace disease. Otherwise, no significant change.   Original Report Authenticated By: Donavan Burnet, M.D.    Dg Chest Port 1 View  01/01/2012  *RADIOLOGY REPORT*  Clinical Data: Evaluate endotracheal tube placement.  PORTABLE CHEST - 1 VIEW  Comparison: Chest x-ray 12/31/2011  Findings: An endotracheal tube is in place with tip 4.2 cm above the carina. There is a  right-sided internal jugular central venous catheter with tip terminating in  the superior cavoatrial junction. A nasogastric tube is seen extending into the stomach, however, the tip of the nasogastric tube extends below the lower margin of the image.  Lung volumes remain low, and there are bibasilar opacities, favored to represent atelectasis (underlying airspace consolidation is difficult to exclude, but is not favored.  Small bilateral pleural effusions are unchanged.  Pulmonary vascular congestion, without frank pulmonary edema.  Heart size appears upper limits of normal.  Mediastinal contours are unremarkable.  Atherosclerosis in the thoracic aorta.  IMPRESSION: 1.  Support apparatus, as above. 2.  Low lung volumes with persistent bibasilar atelectasis and small bilateral pleural effusions. 3.  Atherosclerosis.   Original Report Authenticated By: Florencia Reasons, M.D.    Dg Chest Port 1 View  12/31/2011  *RADIOLOGY REPORT*  Clinical Data: 56 year old female endotracheal tube.  Line placement.  Acute respiratory failure.  PORTABLE CHEST - 1 VIEW  Comparison: 12/31/2011 and earlier.  Findings: Semi upright AP portable view 1429 hours.  The patient is known debated.  Endotracheal tube tip is in good position between the level of clavicles and carina.  Enteric tube courses to the abdomen, partially looped within the known hiatal hernia but terminates below the level of the diaphragm (probably distal stomach).  Right IJ central line placed, distal tip felt to be at the T6-T7 level corresponding to the SVC.  Linear opacity below the level is felt related the spinous processes.  Bilateral pleural effusions.  Bibasilar collapse or consolidation. No pneumothorax.  Stable increased interstitial markings which may relate to vascular congestion.  Stable cardiac size and mediastinal contours.  IMPRESSION: 1.  Endotracheal tube and enteric tube appear in good position. 2.  Right IJ central line tip felt to terminate at the SVC level, linear opacity below the level felt related to the spinous  processes. 3.  Bilateral pleural effusions and lower lobe collapse consolidation with superimposed vascular congestion.   Original Report Authenticated By: Harley Hallmark, M.D.    Dg Chest Port 1 View  12/31/2011  *RADIOLOGY REPORT*  Clinical Data: Shortness of breath.  PORTABLE CHEST - 1 VIEW  Comparison: 12/30/2011  Findings: Shallow inspiration.  Cardiac enlargement with pulmonary vascular congestion and developing perihilar edema more prominent since previous study.  Small bilateral pleural effusions with basilar atelectasis or infiltration.  No pneumothorax.  IMPRESSION: Increasing pulmonary vascular congestion and edema with small bilateral pleural effusions and basilar atelectasis filtration.   Original Report Authenticated By: Marlon Pel, M.D.    Dg Abd Acute W/chest  12/26/2011  *RADIOLOGY REPORT*  Clinical Data: Epigastric pain and nausea.  ACUTE ABDOMEN SERIES (ABDOMEN 2 VIEW & CHEST 1 VIEW)  Comparison: CT abdomen and pelvis 09/27/2011.  Findings: Shallow inspiration with atelectasis or infiltration in the lung bases.  Normal heart size and pulmonary vascularity.  No blunting of costophrenic angles.  No pneumothorax.  Mediastinal contours are intact.  Calcification of the aorta.  Esophageal hiatal hernia behind the heart.  Scattered gas and stool in the colon.  No small or large bowel distension.  No abnormal air fluid levels.  No free air in the abdomen.  Surgical clips in the right upper quadrant.  Degenerative changes in the spine and hips.  IMPRESSION: Shallow inspiration with infiltration or atelectasis in the lung bases.  Esophageal hiatal hernia.  Nonobstructing bowel gas pattern.   Original Report Authenticated By: Marlon Pel, M.D.    Dg Intro Long  Gi Tube  01/05/2012  *RADIOLOGY REPORT*  Clinical Data: Feeding tube placement.  DG INTRO LONG GI TUBE  Technique:  Feeding tube placement under fluoroscopy guidance  Fluoroscopy time: 21 seconds  Comparison:  01/02/2012 CT   Findings:  Under real time observation, feeding tube advanced distal to the hiatal hernia, into the intra-abdominal stomach. Unable to advance into the duodenum.  IMPRESSION: Feeding tube projects over the mid stomach. This is a suboptimal position for tube feeds, however recommend a follow-up radiograph to evaluate for distal migration of the tube via peristalsis.   Original Report Authenticated By: Waneta Martins, M.D.     Medications: I have reviewed the patient's current medications. Scheduled Meds:    . antiseptic oral rinse  15 mL Mouth Rinse QID  . chlorhexidine  15 mL Mouth Rinse BID  . enoxaparin (LOVENOX) injection  40 mg Subcutaneous Q24H  . folic acid  1 mg Intravenous Daily  . furosemide  20 mg Intravenous Once  . insulin aspart  2-6 Units Subcutaneous Q6H  . LORazepam  1 mg Intravenous Q8H  . metronidazole  500 mg Intravenous Q8H  . octreotide  100 mcg Subcutaneous Q8H  . pantoprazole (PROTONIX) IV  40 mg Intravenous Q12H  . piperacillin-tazobactam (ZOSYN)  IV  3.375 g Intravenous Q8H   Continuous Infusions:    . sodium chloride 20 mL/hr (01/13/12 0042)  . TPN (CLINIMIX) +/- additives 80 mL/hr at 01/20/12 1729   And  . fat emulsion 240 mL (01/20/12 1730)  . TPN (CLINIMIX) +/- additives 80 mL/hr at 01/21/12 1713   PRN Meds:.acetaminophen, HYDROmorphone (DILAUDID) injection, menthol-cetylpyridinium, ondansetron (ZOFRAN) IV, polyvinyl alcohol, sodium chloride Assessment/Plan: Patient Active Hospital Problem List:  Ruptured pancreatic pseudocyst with peritonitis:  - patient has had a very complicated course. Currently she is on day #16 no Flagyl day #20 of Zosyn. The duration of antibiotic treatment will be determined by Gen. surgery and is unspecified at this time. -Patient had a new area of collection in the abdomen and had a JP drain placed by interventional radiology yesterday morning which has had about 30 cc of fluid out. -Nutrition management per surgery    Anemia: -s/p transfusion of one unit packed blood cells on 9/28 -Hemoglobin drifted down to 7.2 today. Patient currently receiving blood transfusion per surgery.  -We'll continue to monitor     Gastric ulcer: -Patient status post EGD which showed gastric ulcer.  -Continue twice a day PPI treatment   C. difficile colitis: -As noted above patient on day #16 of Flagyl which is a completed course of treatment for C. Difficile -Repeat C. difficile from 9/29 still pending as patient has had no further stool.      LOS: 26 days

## 2012-01-21 NOTE — Progress Notes (Signed)
Looks a little better.  Will be long process.

## 2012-01-21 NOTE — Progress Notes (Signed)
Occupational Therapy Treatment Patient Details Name: [name] MRN: [id number] DOB: July 12, 1955 Today's Date: 01/21/2012 Time: 4098-1191 OT Time Calculation (min): 13 min  OT Assessment / Plan / Recommendation Comments on Treatment Session Pt progressing slowly, limited by fatigue. Unsure if pt would be able to tolerate CIR at this point.    Follow Up Recommendations  Skilled nursing facility    Barriers to Discharge       Equipment Recommendations  Rolling walker with 5" wheels;3 in 1 bedside comode    Recommendations for Other Services    Frequency Min 2X/week   Plan Discharge plan remains appropriate    Precautions / Restrictions Precautions Precautions: Fall Precaution Comments: 3 abdominal drains and wound vac Restrictions Weight Bearing Restrictions: No   Pertinent Vitals/Pain Reported 7/10 abdominal pain. Repositioned for comfort.    ADL  Grooming: Performed;Brushing hair Where Assessed - Grooming: Unsupported sitting Lower Body Dressing: Performed;Set up Where Assessed - Lower Body Dressing: Other (comment);Supine, head of bed flat (Pt was able to flex hips to bring feet closer to don socks.) Toilet Transfer: Performed;Minimal assistance Toilet Transfer Method: Stand pivot Toilet Transfer Equipment: Other (comment) (to recliner) ADL Comments: Pt continues to fatigue quickly. Only agreeable to OOB to chair today. Pt's hgb 7.7. Also supposed to receive blood later.    OT Diagnosis:    OT Problem List:   OT Treatment Interventions:     OT Goals ADL Goals ADL Goal: Grooming - Progress: Progressing toward goals Pt Will Transfer to Toilet: with supervision ADL Goal: Toilet Transfer - Progress: Updated due to goal met Additional ADL Goal #1: Pt will complete all aspects of bathing and dressing with setup/min A. ADL Goal: Additional Goal #1 - Progress: Goal set today  Visit Information  Last OT Received On: 01/21/12 Assistance Needed: +1    Subjective  Data  Subjective: I think I can do a whole lot today.   Prior Functioning       Cognition  Overall Cognitive Status: Appears within functional limits for tasks assessed/performed Arousal/Alertness: Awake/alert Orientation Level: Appears intact for tasks assessed Behavior During Session: [facility] for tasks performed    Mobility  Shoulder Instructions Bed Mobility Rolling Right: 5: Supervision Right Sidelying to Sit: 4: Min assist;HOB flat;With rails Transfers Sit to Stand: 4: Min assist;With upper extremity assist;From bed Stand to Sit: 4: Min assist;With upper extremity assist;With armrests;To chair/3-in-1 Details for Transfer Assistance: Min VCs for safety and hand placement.       Exercises      Balance Static Sitting Balance Static Sitting - Balance Support: No upper extremity supported;Feet supported Static Sitting - Level of Assistance: 6: Modified independent (Device/Increase time)   End of Session OT - End of Session Activity Tolerance: Patient limited by fatigue;Patient limited by pain Patient left: with call bell/phone within reach  GO     [name] A OTR/L [phone] 01/21/2012, 12:05 PM

## 2012-01-21 NOTE — Progress Notes (Signed)
Pt states that she misunderstood the financial counselor and stated that she is willing to apply for M'ca[employer].  Provided Pt with a M'caid application.    CSW to continue to follow.  Providence Crosby, LCSWA Clinical Social Work [phone]

## 2012-01-21 NOTE — Progress Notes (Signed)
15 Days Post-Op  Subjective: She looks better, not complaining of pain as much.  Would like to be rid of new Perc drain, seems more upbeat.  Objective: Vital signs in last 24 hours: Temp:  [97.8 F (36.6 C)-101.2 F (38.4 C)] 98.6 F (37 C) (10/03 0600) Pulse Rate:  [87-91] 87  (10/03 0600) Resp:  [18-20] 18  (10/03 0600) BP: (93-112)/(51-64) 104/57 mmHg (10/03 0600) SpO2:  [93 %-95 %] 95 % (10/03 0600) Last BM Date: 01/18/12  +BM, 150 ml from Perc Drain in abdomen, 25 Ml drain 1 and 10 Ml drain 2. Temp up to 101.2, single spike, VSS,  CMP is OK, Checking cbc, type and screen Intake/Output from previous day: 10/02 0701 - 10/03 0700 In: 2838.7 [P.O.:720; TPN:2118.7] Out: 635 [Urine:450; Drains:185] Intake/Output this shift:    General appearance: alert, cooperative and no distress GI: soft, tender, complaining of Perc site most.  less drainage from all the drains.  Lab Results:   [facility] 01/20/12 0450 01/19/12 0451  WBC 13.9* 16.8*  HGB 7.2* 7.7*  HCT 23.0* 24.4*  PLT 538* 623*    BMET  Basename 01/21/12 0500 01/20/12 0450  NA 134* 132*  K 3.7 3.7  CL 102 101  CO2 24 22  GLUCOSE 110* 104*  BUN 12 15  CREATININE 0.52 0.48*  CALCIUM 8.1* 8.1*   PT/INR No results found for this basename: LABPROT:2,INR:2 in the last 72 hours   Lab 01/21/12 0500 01/20/12 0450 01/19/12 0451 01/18/12 0510 01/15/12 0508  AST 23 28 29 28 28   ALT 12 13 16 17 27   ALKPHOS 91 101 96 101 84  BILITOT 0.3 0.3 0.4 0.4 0.3  PROT 6.6 6.4 6.8 6.3 5.8*  ALBUMIN 1.7* 1.7* 1.8* 1.6* 1.4*     Lipase     Component Value Date/Time   LIPASE 113* 01/11/2012 0540     Studies/Results: Ct Guided Abscess Drain  01/19/2012  *RADIOLOGY REPORT*  CT GUIDED ABSCESS DRAIN PLACEMENT  Date: 01/19/2012  Clinical History: History of necrotic pancreatitis 10 days status post exploratory laparotomy, pancreatic necrosectomy, and placement of to abdominal drains.  She now has a large peripherally enhancing  loculated peritoneal fluid collection concerning for abscess. Percutaneous drain is requested.  Procedures Performed: 1. CT guided drain placement  Interventional Radiologist:  Sterling Big, MD  Sedation: Moderate (conscious) sedation was used.  4 mg Versed, 200 mcg Fentanyl were administered intravenously.  The patient's vital signs were monitored continuously by radiology nursing throughout the procedure.  Sedation Time: 25 minutes  PROCEDURE/FINDINGS:   Informed consent was obtained from the patient following explanation of the procedure, risks, benefits and alternatives. The patient understands, agrees and consents for the procedure. All questions were addressed. A time out was performed.  Maximal barrier sterile technique utilized including caps, mask, sterile gowns, sterile gloves, large sterile drape, hand hygiene, and betadine skin prep.  A planning axial CT scan was performed.  The large right hemi abdominal peritoneal fluid collection was identified.  An appropriate skin entry site was selected and marked.  Local anesthesia was achieved with infiltration of 1% lidocaine.  Using CT guidance, the fluid collection was punctured with an 18 gauge trocar needle.  A guide wire was advanced caudally within the fluid collection down into the anatomic pelvis.  The tract was then serially dilated to 12 [place] and a 12-[place] all-purpose drainage catheter was advanced over the wire and positioned within the anatomic pelvis.  The 12-French Cook drainage catheter was  modified with multiple additional side holes covering approximately 20 cm of its length to facilitate complete drainage of the fluid collection and prevent loculation into pockets. Approximately 300 ml of purulent fluid was initially aspirated. The tube was connected to gravity drainage and secured to the skin with both Prolene suture and a plastic bumper.  The patient tolerated the procedure well, there was no immediate complication. She was  returned to her room in stable condition.  A sample material was sent for culture.  IMPRESSION:  Successful placement of a 22 [place] all-purpose drainage catheter modified with additional side holes into the right abdominal peritoneal abscess.  Approximately 300 ml of purulent material was aspirated.  A sample was sent for culture.  Signed,  Sterling Big, MD Vascular & Interventional Radiologist [facility] Radiology   Original Report Authenticated By: Vilma Prader     Medications:    . antiseptic oral rinse  15 mL Mouth Rinse QID  . chlorhexidine  15 mL Mouth Rinse BID  . enoxaparin (LOVENOX) injection  40 mg Subcutaneous Q24H  . folic acid  1 mg Intravenous Daily  . insulin aspart  2-6 Units Subcutaneous Q6H  . LORazepam  1 mg Intravenous Q8H  . metronidazole  500 mg Intravenous Q8H  . octreotide  100 mcg Subcutaneous Q8H  . pantoprazole (PROTONIX) IV  40 mg Intravenous Q12H  . piperacillin-tazobactam (ZOSYN)  IV  3.375 g Intravenous Q8H    Assessment/Plan Ruptured pseudocyst with peritonitis, s/p Exploratory laparotomy with debridement of necrotic pancreas and drainage of the upper abdomen 01/03/12 Dr. Wenda Low.  Post op drainage from pseudocyst  WBC still up with intraabdominal fluid collection on CT 01/18/12, for IR drain 01/19/12. 300 ml purulent drainage.  Post op VDRF on Vent, extubated 01/11/12  Anemia H/H= 7.3/22.3  Hx of pancreatitis, and alcohol abuse.  GERD  Elevated liver function tests  GERD (gastroesophageal reflux disease)  Hiatal Hernia  9/18 EGD ([facility]) - 10-12 mm ulcer in the cardia of the stomach, protonix gtt, removed panda  Day 18 flagyl, started 9/15  Day 22 Zosyn, started 9/11   Plan:  Clear liquids, recheck CBC, and if Hbg still down transfuse.  Continue current course.  I will look at wound vac site in AM.  LOS: 26 days    [name] 01/21/2012

## 2012-01-22 ENCOUNTER — Inpatient Hospital Stay (HOSPITAL_COMMUNITY): Payer: MEDICAID

## 2012-01-22 LAB — TYPE AND SCREEN
ABO/RH(D): A POS
DAT, IgG: POSITIVE
Unit division: 0
Unit division: 0

## 2012-01-22 LAB — CBC
HCT: 30.1 % — ABNORMAL LOW (ref 36.0–46.0)
Hemoglobin: 9.9 g/dL — ABNORMAL LOW (ref 12.0–15.0)
RBC: 3.44 MIL/uL — ABNORMAL LOW (ref 3.87–5.11)
WBC: 11.2 10*3/uL — ABNORMAL HIGH (ref 4.0–10.5)

## 2012-01-22 LAB — BASIC METABOLIC PANEL
BUN: 11 mg/dL (ref 6–23)
CO2: 26 mEq/L (ref 19–32)
Chloride: 98 mEq/L (ref 96–112)
Glucose, Bld: 175 mg/dL — ABNORMAL HIGH (ref 70–99)
Potassium: 3.8 mEq/L (ref 3.5–5.1)

## 2012-01-22 LAB — GLUCOSE, CAPILLARY
Glucose-Capillary: 113 mg/dL — ABNORMAL HIGH (ref 70–99)
Glucose-Capillary: 148 mg/dL — ABNORMAL HIGH (ref 70–99)

## 2012-01-22 MED ORDER — FAT EMULSION 20 % IV EMUL
240.0000 mL | INTRAVENOUS | Status: AC
  Administered 2012-01-22: 240 mL via INTRAVENOUS
  Filled 2012-01-22: qty 250

## 2012-01-22 MED ORDER — FUROSEMIDE 20 MG PO TABS
20.0000 mg | ORAL_TABLET | Freq: Every day | ORAL | Status: DC
  Administered 2012-01-22 – 2012-02-09 (×19): 20 mg via ORAL
  Filled 2012-01-22 (×19): qty 1

## 2012-01-22 MED ORDER — OXYCODONE-ACETAMINOPHEN 5-325 MG PO TABS
1.0000 | ORAL_TABLET | ORAL | Status: DC | PRN
  Administered 2012-01-22: 2 via ORAL
  Filled 2012-01-22: qty 2

## 2012-01-22 MED ORDER — MANGANESE CHLORIDE 0.1 MG/ML IV SOLN
INTRAVENOUS | Status: AC
  Administered 2012-01-22: 17:00:00 via INTRAVENOUS
  Filled 2012-01-22: qty 2000

## 2012-01-22 NOTE — Progress Notes (Signed)
Slow but steady progress.  Drainage decreasing .  Try to slowly increase diet.

## 2012-01-22 NOTE — Progress Notes (Addendum)
16 Days Post-Op  Subjective: No real change, complains of some occasional SOB  Has O2 on this AM, she has had some effusion on xrays in past.  Objective: Vital signs in last 24 hours: Temp:  [98.6 F (37 C)-100 F (37.8 C)] 99.3 F (37.4 C) (10/04 0630) Pulse Rate:  [85-89] 86  (10/04 0630) Resp:  [18-20] 18  (10/04 0630) BP: (99-119)/(63-71) 109/66 mmHg (10/04 0630) SpO2:  [92 %-94 %] 92 % (10/04 0630) Last BM Date: 01/20/12   460 PO recorded, 95 ml from drains, Tm 99.3, labs look better  Intake/Output from previous day: 10/03 0701 - 10/04 0700 In: 5568 [P.O.:460; I.V.:1391.7; Blood:675; IV Piggyback:1200; TPN:1841.3] Out: 95 [Drains:95] Intake/Output this shift:    General appearance: alert, cooperative and mild distress Resp: BS down in both bases, no rales. GI: tender as usual, but tolerating some clear fluids, drainage as note is down, but looks about the same.  Lab Results:   Basename 01/22/12 0800 01/21/12 0950  WBC 11.2* 13.3*  HGB 9.9* 7.7*  HCT 30.1* 25.0*  PLT 483* 568*    BMET  Basename 01/22/12 0800 01/21/12 0500  NA 132* 134*  K 3.8 3.7  CL 98 102  CO2 26 24  GLUCOSE 175* 110*  BUN 11 12  CREATININE 0.53 0.52  CALCIUM 8.2* 8.1*   PT/INR No results found for this basename: LABPROT:2,INR:2 in the last 72 hours   Lab 01/21/12 0500 01/20/12 0450 01/19/12 0451 01/18/12 0510  AST 23 28 29 28   ALT 12 13 16 17   ALKPHOS 91 101 96 101  BILITOT 0.3 0.3 0.4 0.4  PROT 6.6 6.4 6.8 6.3  ALBUMIN 1.7* 1.7* 1.8* 1.6*     Lipase     Component Value Date/Time   LIPASE 113* 01/11/2012 0540     Studies/Results: No results found.  Medications:    . antiseptic oral rinse  15 mL Mouth Rinse QID  . chlorhexidine  15 mL Mouth Rinse BID  . enoxaparin (LOVENOX) injection  40 mg Subcutaneous Q24H  . folic acid  1 mg Intravenous Daily  . furosemide  20 mg Intravenous Once  . insulin aspart  2-6 Units Subcutaneous Q6H  . LORazepam  1 mg Intravenous Q8H    . metronidazole  500 mg Intravenous Q8H  . octreotide  100 mcg Subcutaneous Q8H  . pantoprazole (PROTONIX) IV  40 mg Intravenous Q12H  . piperacillin-tazobactam (ZOSYN)  IV  3.375 g Intravenous Q8H    Assessment/Plan Ruptured pseudocyst with peritonitis, s/p Exploratory laparotomy with debridement of necrotic pancreas and drainage of the upper abdomen 01/03/12 Dr. Wenda Low.  Post op drainage from pseudocyst  WBC still up with intraabdominal fluid collection on CT 01/18/12, for IR drain 01/19/12. 300 ml purulent drainage.  Post op VDRF on Vent, extubated 01/11/12  Anemia H/H= 7.3/22.3 Transfused on 01/21/12. Hx of pancreatitis, and alcohol abuse.  GERD  Elevated liver function tests  GERD (gastroesophageal reflux disease)  Hiatal Hernia  9/18 EGD ([facility]) - 10-12 mm ulcer in the cardia of the stomach, protonix gtt, removed panda  Current ABX: flagyl, started 9/15,  Zosyn, started 9/11  Plan:  Change dressing today, add some PO lasix, recheck CXR, continue to mobilize, discuss increasing diet. She will need a repeat CT scan next week. Start some PO pain meds also. Continue TNA.   Dressing change of abd wound done, and it is about 40% reduction in the size of the wound.  It still has some fibrous tissue  base of the wound both at the top and bottom of the wound.  This is decreasing in size, fascial sutures are still loose. The bottom portion of the wound is closing faster that the top  Middle portion between is closing very nicely and looks good.1415 hrs.    LOS: 27 days    [name] 01/22/2012

## 2012-01-22 NOTE — Progress Notes (Signed)
CSW continuing to follow for SNF needs when medically stable. Due to need for Letter of guarantee and complex medical needs, CSW is working with Chiropodist, Wandra Mannan for possible placement options.  Dany Walther C. Chelcie Estorga MSW, LCSW [phone]

## 2012-01-22 NOTE — Progress Notes (Signed)
Subjective: Patient states that she feels better today. In general she appears to have much more energy today and has been up several times at the nurses ambulating around the room.   Objective: Filed Vitals:   01/21/12 2145 01/21/12 2230 01/22/12 0630 01/22/12 1405  BP: 113/71 110/70 109/66 116/75  Pulse: 85 86 86 83  Temp: 98.6 F (37 C) 100 F (37.8 C) 99.3 F (37.4 C) 98.9 F (37.2 C)  TempSrc: Oral Oral Oral Oral  Resp: 18 18 18 20   Height:      Weight:      SpO2: 92%  92% 95%   Weight change:   Intake/Output Summary (Last 24 hours) at 01/22/12 1803 Last data filed at 01/22/12 1128  Gross per 24 hour  Intake   4758 ml  Output    845 ml  Net   3913 ml    General: Alert, awake, oriented x3, in no acute distress.  HEENT: Powers/AT PEERL, EOMI Heart: Regular rate and rhythm.  Lungs: Clear to auscultation Abdomen: Mildly distended, positive bowel sounds. And won't with good granulation. Midline laparotomy incision with 2 JP drains and drainage. Neuro: No focal neurological deficits noted. Musculoskeletal: No warm swelling or erythema around joints, no spinal tenderness noted.    Lab Results:  Basename 01/22/12 0800 01/21/12 0500 01/20/12 0450  NA 132* 134* --  K 3.8 3.7 --  CL 98 102 --  CO2 26 24 --  GLUCOSE 175* 110* --  BUN 11 12 --  CREATININE 0.53 0.52 --  CALCIUM 8.2* 8.1* --  MG -- 2.0 1.8  PHOS -- 3.5 --    Basename 01/21/12 0500 01/20/12 0450  AST 23 28  ALT 12 13  ALKPHOS 91 101  BILITOT 0.3 0.3  PROT 6.6 6.4  ALBUMIN 1.7* 1.7*   No results found for this basename: LIPASE:2,AMYLASE:2 in the last 72 hours  Basename 01/22/12 0800 01/21/12 0950  WBC 11.2* 13.3*  NEUTROABS -- --  HGB 9.9* 7.7*  HCT 30.1* 25.0*  MCV 87.5 90.3  PLT 483* 568*   No results found for this basename: CKTOTAL:3,CKMB:3,CKMBINDEX:3,TROPONINI:3 in the last 72 hours No components found with this basename: POCBNP:3 No results found for this basename: DDIMER:2 in the  last 72 hours No results found for this basename: HGBA1C:2 in the last 72 hours No results found for this basename: CHOL:2,HDL:2,LDLCALC:2,TRIG:2,CHOLHDL:2,LDLDIRECT:2 in the last 72 hours No results found for this basename: TSH,T4TOTAL,FREET3,T3FREE,THYROIDAB in the last 72 hours No results found for this basename: VITAMINB12:2,FOLATE:2,FERRITIN:2,TIBC:2,IRON:2,RETICCTPCT:2 in the last 72 hours  Micro Results: Recent Results (from the past 240 hour(s))  ANAEROBIC CULTURE     Status: Normal (Preliminary result)   Collection Time   01/19/12  2:00 PM      Component Value Range Status Comment   Specimen Description PERITONEAL CAVITY   Final    Special Requests Normal   Final    Gram Stain     Final    Value: FEW WBC PRESENT,BOTH PMN AND MONONUCLEAR     NO ORGANISMS SEEN   Culture     Final    Value: NO ANAEROBES ISOLATED; CULTURE IN PROGRESS FOR 5 DAYS   Report Status PENDING   Incomplete   BODY FLUID CULTURE     Status: Normal (Preliminary result)   Collection Time   01/19/12  2:00 PM      Component Value Range Status Comment   Specimen Description FLUID PERITONEAL CAVITY   Final    Special Requests NONE  Final    Gram Stain     Final    Value: FEW WBC PRESENT,BOTH PMN AND MONONUCLEAR     NO ORGANISMS SEEN   Culture NO GROWTH 1 DAY   Final    Report Status PENDING   Incomplete     Studies/Results: Ct Abdomen Pelvis Wo Contrast  12/30/2011  *RADIOLOGY REPORT*  Clinical Data: Abdominal pain.  Nausea and vomiting.  Elevated liver function tests.  Follow-up acute pancreatitis.  CT ABDOMEN AND PELVIS WITHOUT CONTRAST  Technique:  Multidetector CT imaging of the abdomen and pelvis was performed following the standard protocol without intravenous contrast.  Comparison: 12/27/2011  Findings: New small bilateral pleural effusions and bibasilar atelectasis or infiltrates are seen.  A moderate size hiatal hernia is again demonstrated.  Moderate ascites within the abdomen and pelvis has  significantly increased since previous study.  New diffuse body wall edema also noted.  A persistent small focal fluid collection is seen between the posterior wall the gastric antrum and the pancreatic neck which measures approximately 2 x 3 cm and is not significantly changed. This is suspicious for a small pancreatic pseudocyst.  Swelling of the pancreatic head and peripancreatic inflammatory changes remain consistent with acute pancreatitis, but show no significant change.  Surgical clips again seen from prior cholecystectomy.  No definite evidence of biliary dilatation. Liver, spleen, adrenal glands, and kidneys are unremarkable in appearance on this noncontrast study.  No soft tissue masses or lymphadenopathy identified.  Sigmoid diverticulosis is again demonstrated, however there is no evidence of diverticulitis.  Foley catheter is now seen within the bladder which is collapsed.  Increased colonic ileus pattern is demonstrated.  IMPRESSION:  1. No significant change in appearance of the acute pancreatitis involving the pancreatic head and neck with probable 3 cm pseudocyst along the posterior wall of gastric antrum. 2.  Significant increase in moderate ascites and diffuse body wall edema since prior study.  New bilateral pleural effusions and bibasilar atelectasis or infiltrates also noted. 3.  Worsening colonic ileus pattern. 4.  Moderate hiatal hernia.   Original Report Authenticated By: Danae Orleans, M.D.    Dg Chest 2 View  12/30/2011  *RADIOLOGY REPORT*  Clinical Data: Low grade temperature and wheezes.  CHEST - 2 VIEW  Comparison: 03/24/2011  Findings: Two views of the chest demonstrate bibasilar densities suggestive for pleural fluid.  Prominent interstitial markings may represent mild edema.  There are increased densities in the right lung and cannot exclude airspace disease.  Heart size appears to be upper limits of normal.  Negative for a pneumothorax.  IMPRESSION: There is evidence for  bilateral pleural effusions and cannot exclude mild interstitial edema.  Patchy densities in the right lung may represent asymmetric edema versus infection.   Original Report Authenticated By: Richarda Overlie, M.D.    Dg Abd 1 View  01/06/2012  *RADIOLOGY REPORT*  Clinical Data: Evaluate pending tube placement  ABDOMEN - 1 VIEW  Comparison: Portable abdomen 01/05/2012  Findings: The feeding tube tip is in the distal body - proximal antrum of the stomach.  The bowel gas pattern is nonspecific. Contrast is noted scattered throughout the nondistended colon.  IMPRESSION: Feeding tube tip in distal body - proximal antrum of the stomach.   Original Report Authenticated By: Juline Patch, M.D.    Dg Abd 1 View  01/05/2012  *RADIOLOGY REPORT*  Clinical Data: Evaluate feeding tube position.  ABDOMEN - 1 VIEW  Comparison: 01/05/2012 fluoroscopic image  Findings: KUB following administration  of Omnipaque contrast via the patient's feeding tube demonstrates the tube tip to now be positioned within the mid to distal stomach.  However, the administered contrast is noted to reflux proximally rather than continue into the duodenum.  There is residual contrast within the colon from a prior examination.  Mild bibasilar opacities/pleural effusions are noted.  Surgical clips right upper quadrant.  IMPRESSION: Feeding tube tip has migrated distally, now located within the mid/distal stomach.  However, administered contrast is noted to reflux into the more proximal stomach rather than continue into the duodenum. Therefore, I would not recommend tube feeds unless the tube tip has passed the gastroduodenal junction. Recommend repeat radiograph in the a.m. to evaluate for tube tip position at that time.   Original Report Authenticated By: Waneta Martins, M.D.    Ct Guided Abscess Drain  01/19/2012  *RADIOLOGY REPORT*  CT GUIDED ABSCESS DRAIN PLACEMENT  Date: 01/19/2012  Clinical History: History of necrotic pancreatitis 10 days  status post exploratory laparotomy, pancreatic necrosectomy, and placement of to abdominal drains.  She now has a large peripherally enhancing loculated peritoneal fluid collection concerning for abscess. Percutaneous drain is requested.  Procedures Performed: 1. CT guided drain placement  Interventional Radiologist:  Sterling Big, MD  Sedation: Moderate (conscious) sedation was used.  4 mg Versed, 200 mcg Fentanyl were administered intravenously.  The patient's vital signs were monitored continuously by radiology nursing throughout the procedure.  Sedation Time: 25 minutes  PROCEDURE/FINDINGS:   Informed consent was obtained from the patient following explanation of the procedure, risks, benefits and alternatives. The patient understands, agrees and consents for the procedure. All questions were addressed. A time out was performed.  Maximal barrier sterile technique utilized including caps, mask, sterile gowns, sterile gloves, large sterile drape, hand hygiene, and betadine skin prep.  A planning axial CT scan was performed.  The large right hemi abdominal peritoneal fluid collection was identified.  An appropriate skin entry site was selected and marked.  Local anesthesia was achieved with infiltration of 1% lidocaine.  Using CT guidance, the fluid collection was punctured with an 18 gauge trocar needle.  A guide wire was advanced caudally within the fluid collection down into the anatomic pelvis.  The tract was then serially dilated to 12 [place] and a 12-French Cook all-purpose drainage catheter was advanced over the wire and positioned within the anatomic pelvis.  The 12-French Cook drainage catheter was modified with multiple additional side holes covering approximately 20 cm of its length to facilitate complete drainage of the fluid collection and prevent loculation into pockets. Approximately 300 ml of purulent fluid was initially aspirated. The tube was connected to gravity drainage and secured to the  skin with both Prolene suture and a plastic bumper.  The patient tolerated the procedure well, there was no immediate complication. She was returned to her room in stable condition.  A sample material was sent for culture.  IMPRESSION:  Successful placement of a 52 French Cook all-purpose drainage catheter modified with additional side holes into the right abdominal peritoneal abscess.  Approximately 300 ml of purulent material was aspirated.  A sample was sent for culture.  Signed,  Sterling Big, MD Vascular & Interventional Radiologist [facility]   Original Report Authenticated By: Threasa Beards Abdomen Pelvis W Contrast  01/18/2012  *RADIOLOGY REPORT*  Clinical Data: Acute pancreatitis.  Epigastric pain with fever. Pancreatic pseudocyst.  CT ABDOMEN AND PELVIS WITH CONTRAST  Technique:  Multidetector CT imaging of  the abdomen and pelvis was performed following the standard protocol during bolus administration of intravenous contrast.  Contrast: OMNIPAQUE IOHEXOL 300 MG/ML  SOLN  Comparison: CT scan dated 01/02/2012  Findings: The patient has significantly increased moderate bilateral pleural effusions with secondary atelectasis at the lung bases.  Heart size is normal. No change in the large hiatal hernia.  Two pseudocyst drainage catheters are present in the upper abdomen. There has been slight decrease in the fluid along the left pericolic gutter.  The patient now has increased enhancement of the margins of the loculated fluid in the abdomen and pelvis.  There is a fluid collection which extends from the overlying the top of the right lobe of the liver down the right pericolic gutter into the pelvis.  This collection measures 35 x 17 x 5.3 cm.  The drainage catheter in the left upper quadrant is within the left upper quadrant recess at this fluid collection that I suspect that the fluid with draining pattern with a midline pelvic catheter.  There is a small amount of residual pancreatic  fluid around the mid abdominal catheter.  This has decreased.  The liver, pancreas, adrenal glands, and kidneys are normal and stable.  Small pancreatic pseudocyst in the head of the pancreas is better defined measures 16 mm.  No dilated bowel.  Uterus and ovaries are normal.  No acute osseous abnormality.  IMPRESSION:  1.  The patient has developed an enhancing rim around the extensive loculated fluid in both sides of the abdomen consistent with a pseudocyst.  The most prominent portion of the collection is in the right side of the abdomen and the pelvis.  The draining in the left upper quadrant is within the recess at this collection but I suspect another drain in the pelvis might be useful. 2.  Slightly better definition of the small pseudocyst in the head of the pancreas. 3.  Interval development of moderate bilateral pleural effusions.   Original Report Authenticated By: Gwynn Burly, M.D.    Ct Abdomen Pelvis W Contrast  01/08/2012  *RADIOLOGY REPORT*  Clinical Data: Follow-up pancreatitis and abdominal and pelvic fluid collections.  CT ABDOMEN AND PELVIS WITH CONTRAST  Technique:  Multidetector CT imaging of the abdomen and pelvis was performed following the standard protocol during bolus administration of intravenous contrast.  Contrast: OMNIPAQUE IOHEXOL 300 MG/ML  SOLN  Comparison: 09/14/2013and 12/27/2011  Findings: Mild increase in size of small bilateral pleural effusions is seen.  Intraperitoneal drainage catheters are now seen in place, and decreased ascites is seen within the abdomen pelvis since prior exam.  Foley catheter again seen within the bladder which is collapsed.  A small pseudocyst is again seen in the pancreatic head.  This shows mild enlargement compared to recent study 6 days ago, currently measuring 2.3 x 2.4 cm, but is significantly decreased in size compared to earlier exam on 12/27/2011. No other loculated peri pancreatic fluid collections are identified. There is no  evidence of distal pancreatic atrophy or pancreatic ductal dilatation.  Prior cholecystectomy noted.  No evidence of biliary ductal dilatation.  Hepatic steatosis is again demonstrated, without evidence of focal liver mass.  The spleen, adrenal glands, and kidneys are normal in appearance.  No evidence of lymphadenopathy.  Uterus and adnexa are unremarkable.  Sigmoid diverticulosis is noted, however there is no evidence of diverticulitis.  No evidence of bowel obstruction. Diffuse body wall edema again noted. Moderate hiatal hernia again seen.  IMPRESSION:  1.  Decrease  in moderate abdominal and pelvic ascites with intraperitoneal drainage catheters now seen in place.  Mild increase in small bilateral pleural effusions. No significant change in diffuse body wall edema. 2.  Persistent small pseudocyst in the area the pancreatic head, which is decreased in size since earlier exam on 12/27/2011. Continued follow-up by CT is recommended. 3.  Ancillary findings again noted including moderate hiatal hernia, hepatic steatosis, and sigmoid diverticulosis.   Original Report Authenticated By: Danae Orleans, M.D.    Ct Abdomen Pelvis W Contrast  01/02/2012  **ADDENDUM** CREATED: 01/02/2012 17:36:05  I spoke with the critical care physician by telephone, and in further review of the CT images, the gas bubbles which I initially thought were in the pelvic ascites I believe are actually within a completely collapsed urinary bladder which has been displaced anteriorly by the ascites in the pelvis.  Therefore, I do not believe this represents free intraperitoneal air.  **END ADDENDUM** SIGNED BY: Arnell Sieving, M.D.   01/02/2012  *RADIOLOGY REPORT*  Clinical Data: Follow up pancreatitis.  Epigastric abdominal pain. Leukocytosis with white blood cell count 38,800.  CT ABDOMEN AND PELVIS WITH CONTRAST  Technique:  Multidetector CT imaging of the abdomen and pelvis was performed following the standard protocol during bolus  administration of intravenous contrast.  Contrast: OMNIPAQUE IOHEXOL 300 MG/ML. Oral contrast was also administered.  Comparison: CT abdomen and pelvis 12/30/2011, 12/27/2011, 09/27/2011 03/18/2011, 11/27/2010.  Findings: Interval increase in the amount of abdominal and pelvic ascites since the examination 3 days ago, now quite large.  Gas bubbles within the ascitic fluid in the pelvis.  Enhancement of the peritoneum and omentum, with mild thickening.  The fluid has Hounsfield measurement of near 0, indicating simple fluid.  Persistent edema/inflammation surrounding the head of the pancreas, with an approximate 1.8 x 2.6 cm pseudocyst at the junction of the head and body; this is unchanged from the examination 3 days ago and has increased slightly since the June, 2013 examination. Developing pseudocyst in the pancreatic head measuring approximately 1.3 x 2.6 cm, not present on the June, 2013 examination.  Gallbladder surgically absent.  No unexpected biliary ductal dilation.  Mild diffuse hepatic steatosis without focal hepatic parenchymal abnormality.  Normal appearing spleen, adrenal glands, and kidneys.  Moderate to severe aorto-iliac atherosclerosis without aneurysm.  Large hiatal hernia.  Nasogastric tube looped within the intrathoracic portion of the stomach.  Oral contrast material in the distal esophagus.  Upper normal colon or small bowel loops without obstruction.  Gaseous distention of the transverse colon. Sigmoid colon diverticulosis without evidence of acute diverticulitis.  Uterus and ovaries unremarkable by CT.  Phleboliths in both sides of the low pelvis.  Bone window images demonstrate degenerative changes involving the lower thoracic and lumbar spine. Consolidation in the deep posterior lower lobes bilaterally with air bronchograms.  Small left pleural effusion.  Diffuse body wall edema.  Heart size upper normal.  IMPRESSION:  1. Since the CT 3 days ago, interval increase in the ascites  diffusely throughout the abdomen and pelvis.  Gas bubbles are present within the ascitic fluid in the pelvis, presumably from recent tap.  2. Enhancement and mild thickening of the peritoneum and omentum, consistent with inflammation.  The ascitic fluid has a Hounsfield measurement near 0, indicating simple fluid (not hemorrhagic). 3.  Probable acute pancreatitis involving the head of the pancreas. Stable pseudocyst at the junction of the head and body.  Developing pseudocyst in the head of the pancreas. 4.  Diffuse  hepatic steatosis without focal hepatic parenchymal abnormality. 5.  Transverse colon ileus. 6.  Sigmoid colon diverticulosis without evidence of acute diverticulitis. 7.  Large hiatal hernia.  The nasogastric tube is looped within the intrathoracic stomach.  Oral contrast material in the distal esophagus is consistent with reflux. 8.  Bilateral lower lobe atelectasis and/or pneumonia.  Small left pleural effusion. 9.  Age advanced aorto-iliac atherosclerosis. 10.  Anasarca.   Original Report Authenticated By: Arnell Sieving, M.D.    Ct Abdomen Pelvis W Contrast  12/27/2011  *RADIOLOGY REPORT*  Clinical Data: Pain.  Pancreatitis.  White cell count 15.8.  Lipase 177.  Field  CT ABDOMEN AND PELVIS WITH CONTRAST  Technique:  Multidetector CT imaging of the abdomen and pelvis was performed following the standard protocol during bolus administration of intravenous contrast.  Contrast: OMNIPAQUE IOHEXOL 300 MG/ML  SOLN  Comparison: 09/27/2011.  Findings: Atelectasis or infiltration in both lung bases.  Large esophageal hiatal hernia.  There is extensive inflammatory infiltration and edema in the upper abdominal fat surrounding the pancreas and extending into the mesenteric and omental regions.  Changes are consistent with progressing acute pancreatitis.  Peripancreatic fluid collections are again demonstrated consistent with pseudocyst, similar to previous study.  Mild pancreatic ductal dilatation.   There appears to be homogeneous enhancement of the pancreatic parenchyma without evidence of pancreatic necrosis.  No focal abscess.  Mild intra and extrahepatic bile duct dilatation. Portal and mesenteric vessels appear patent.  Surgical absence of the gallbladder.  The liver, spleen, adrenal glands, kidneys, and retroperitoneal lymph nodes are unremarkable.  Calcification of the abdominal aorta without aneurysm.  The stomach, small bowel, and colon are not distended.  No free air in the abdomen.  Pelvis:  Small amount of free fluid in the pelvis, likely related to inflammatory process.  No loculated fluid collections.  Bladder is decompressed.  The uterus and adnexal structures are not enlarged.  The appendix is normal.  No diverticulitis.  No significant lymphadenopathy in the pelvis.  IMPRESSION: Significant increase in upper abdominal inflammatory changes and edema consistent with progression of acute pancreatitis.  Fluid collections consistent with pseudocyst appears stable.   Original Report Authenticated By: Marlon Pel, M.D.    US Paracentesis  01/03/2012  *RADIOLOGY REPORT*  Clinical Data: Sepsis, pancreatitis, possible additional intra- abdominal process, ascites  ULTRASOUND GUIDED PARACENTESIS  Comparison:  None  An ultrasound guided paracentesis was thoroughly discussed with the patient and questions answered.  The benefits, risks, alternatives and complications were also discussed.  The patient understands and wishes to proceed with the procedure.  Written consent was obtained.  Ultrasound was performed to localize and mark an adequate pocket of fluid in the left lower quadrant of the abdomen. The fluid did appear to have some debris evident.  The area was then prepped and draped in the normal sterile fashion.  1% Lidocaine was used for local anesthesia.  Under ultrasound guidance a 19 gauge Yueh catheter was introduced.  Paracentesis was performed.  The catheter was removed and a dressing  applied.  Complications:  None  Findings:  A total of approximately 1.5 liters of turbid brown- green fluid, with free floating debris was removed.  A fluid sample was sent for laboratory analysis.  IMPRESSION: Successful ultrasound guided paracentesis yielding 1.5 liters of ascites. However the fluid is concerning for acute intra-abdominal process. Findings discussed with attending/ordering physician.  Read BY Brayton El PA-C   Original Report Authenticated By: Donavan Burnet, M.D.  Dg Chest Port 1 View  01/13/2012  *RADIOLOGY REPORT*  Clinical Data: Atelectasis.  PORTABLE CHEST - 1 VIEW  Comparison: 01/12/2012.  Findings: Right IJ central line appears unchanged.  Low lung volumes are present with greater than left basilar atelectasis. Drain projects over the left chest and left upper quadrant. Cardiomediastinal contours are unchanged.  IMPRESSION: No interval change.   Original Report Authenticated By: Andreas Newport, M.D.    Dg Chest Port 1 View  01/12/2012  *RADIOLOGY REPORT*  Clinical Data: Extubation.  PORTABLE CHEST - 1 VIEW  Comparison: 01/11/2012.  Findings: Endotracheal tube removed.  Right IJ central line persists.  Decreased lung volumes are present, expected after extubation.  Persistent bilateral effusions and basilar atelectasis. Cardiomediastinal contours appear unchanged.  IMPRESSION: Interval extubation with expected lower lung volumes.  Right IJ line persists.   Original Report Authenticated By: Andreas Newport, M.D.    Dg Chest Port 1 View  01/11/2012  *RADIOLOGY REPORT*  Clinical Data: Endotracheal tube placement.  PORTABLE CHEST - 1 VIEW  Comparison: 01/09/2012.  Findings: Endotracheal tube tip is 30 mm from the carina.  Lung volumes are lower than on prior exam.  Retrocardiac density is present, retrocardiac density is present likely representing atelectasis. There may be a small left pleural effusion.  Discoid atelectasis over the right hemidiaphragm.  Right IJ central line is  unchanged.  IMPRESSION:  1.  Stable support apparatus. 2.  Increasing bibasilar atelectasis.  Probable small left pleural effusion.   Original Report Authenticated By: Andreas Newport, M.D.    Dg Chest Port 1 View  01/09/2012  *RADIOLOGY REPORT*  Clinical Data: Pneumonia.  Pleural effusions.  PORTABLE CHEST - 1 VIEW  Comparison: 01/08/2012  Findings: Endotracheal tube tip 3.7 cm above the carina.  Right IJ line tip projects over the SVC.  Low lung volumes are present, causing crowding of the pulmonary vasculature.  The patient is rotated to the right on today's exam, resulting in reduced diagnostic sensitivity and specificity. Stable mild atelectasis noted along the right hemidiaphragm. Minimally improved aeration at the left lung base, although retrocardiac airspace opacity persists.  IMPRESSION:  1.  Stable appearance of bibasilar airspace opacity and low lung volumes.  Support apparatus unchanged.   Original Report Authenticated By: Dellia Cloud, M.D.    Dg Chest Port 1 View  01/08/2012  *RADIOLOGY REPORT*  Clinical Data: Pleural effusion.  PORTABLE CHEST - 1 VIEW  Comparison: Chest 01/06/2012 and 01/07/2012.  Findings: Support tubes and lines are unchanged. Left pleural effusion and left worse than right basilar airspace disease appear unchanged.  No pneumothorax identified.  IMPRESSION: No interval change.   Original Report Authenticated By: Bernadene Bell. Maricela Curet, M.D.    Dg Chest Port 1 View  01/07/2012  *RADIOLOGY REPORT*  Clinical Data: Endotracheal tube placement.  PORTABLE CHEST - 1 VIEW  Comparison: Chest 01/06/2012.  Findings: Endotracheal tube is in place with tip in good position at the level of the clavicular heads.  Feeding tube is no longer visualized.  Right IJ catheter remains in place.  Left basilar airspace opacity is unchanged.  Right lung appears clear.  No pneumothorax.  IMPRESSION:  1.  ET tube in good position. 2.  No change in left basilar airspace opacity.   Original Report  Authenticated By: Bernadene Bell. Maricela Curet, M.D.    Dg Chest Port 1 View  01/06/2012  *RADIOLOGY REPORT*  Clinical Data: Pleural effusion.  PORTABLE CHEST - 1 VIEW  Comparison: 01/05/2012.  Findings: Patient is rotated to  the left.  The endotracheal tube appears similar to the prior exam.  The nasogastric tube has been replaced with a feeding tube and the tip is not visualized.  There is redundant tubing over the thoracic inlet and there may be a coiled loop of the feeding tube in the proximal esophagus or hypopharynx.  Consider retracting and re-advancing the feeding tube for better positioning and to see if the loop produces.  Bibasilar atelectasis remains present. Right IJ central line remains present.  IMPRESSION: 1.  Endotracheal tube and right IJ central line are unchanged. 2.  Exchange of nasogastric tube for feeding tube.  There appears to be a redundant loop in the hypopharynx and upper esophagus however this could be external to the patient as well.  The inspection is recommended. 3.  Persistent bilateral basilar atelectasis and probable left pleural effusion.  This is a call report.   Original Report Authenticated By: Andreas Newport, M.D.    Dg Chest Port 1 View  01/05/2012  *RADIOLOGY REPORT*  Clinical Data: Atelectasis.  Effusions.  PORTABLE CHEST - 1 VIEW  Comparison: Chest 01/03/2012 and 01/04/2012.  Findings: Support tubes and lines are unchanged.  Lung volumes are lower than on the most recent study with increased basilar atelectasis, worse on the left.  No pneumothorax is identified. Heart size is normal.  IMPRESSION: Increased bibasilar atelectasis.  No other change.   Original Report Authenticated By: Bernadene Bell. Maricela Curet, M.D.    Dg Chest Port 1 View  01/04/2012  *RADIOLOGY REPORT*  Clinical Data: Increased heart rate.  Decreased oxygen saturation.  PORTABLE CHEST - 1 VIEW  Comparison: Plain film chest 01/03/2012.  Findings: Support tubes and lines are unchanged.  No pneumothorax is  identified.  Discoid atelectasis in the lung bases is more notable on the right.  The lungs are otherwise clear.  IMPRESSION:  1.  Support apparatus in good position. 2.  Bibasilar atelectasis.   Original Report Authenticated By: Bernadene Bell. Maricela Curet, M.D.    Dg Chest Port 1 View  01/03/2012  *RADIOLOGY REPORT*  Clinical Data: Acute respiratory distress syndrome, endotracheal tube placement.  PORTABLE CHEST - 1 VIEW  Comparison: January 02, 2012.  Findings: Cardiomediastinal silhouette appears normal. Endotracheal tube is in grossly good position with tip several centimeters above the carina.  Nasogastric tube tip is coiled within proximal stomach.  Right internal jugular catheter line is unchanged in position. Left basilar opacity is unchanged compared to prior exam.  No change is noted in mild bilateral pleural effusions.  IMPRESSION: No change compared to prior exam.   Original Report Authenticated By: Venita Sheffield., M.D.    Dg Chest Port 1 View  01/02/2012  *RADIOLOGY REPORT*  Clinical Data: Endotracheal tube  PORTABLE CHEST - 1 VIEW  Comparison: Yesterday  Findings: Endotracheal tube tip 30.0 cm from the carina.  Lungs markedly under aerated with increasing bibasilar atelectasis and/or airspace disease.  Pleural effusions suspected.  Right internal jugular vein center venous catheter stable.  No pneumothorax.  IMPRESSION: Increasing bibasilar atelectasis verses airspace disease. Otherwise, no significant change.   Original Report Authenticated By: Donavan Burnet, M.D.    Dg Chest Port 1 View  01/01/2012  *RADIOLOGY REPORT*  Clinical Data: Evaluate endotracheal tube placement.  PORTABLE CHEST - 1 VIEW  Comparison: Chest x-ray 12/31/2011  Findings: An endotracheal tube is in place with tip 4.2 cm above the carina. There is a right-sided internal jugular central venous catheter with tip terminating in the superior cavoatrial junction.  A nasogastric tube is seen extending into the stomach, however,  the tip of the nasogastric tube extends below the lower margin of the image.  Lung volumes remain low, and there are bibasilar opacities, favored to represent atelectasis (underlying airspace consolidation is difficult to exclude, but is not favored.  Small bilateral pleural effusions are unchanged.  Pulmonary vascular congestion, without frank pulmonary edema.  Heart size appears upper limits of normal.  Mediastinal contours are unremarkable.  Atherosclerosis in the thoracic aorta.  IMPRESSION: 1.  Support apparatus, as above. 2.  Low lung volumes with persistent bibasilar atelectasis and small bilateral pleural effusions. 3.  Atherosclerosis.   Original Report Authenticated By: Florencia Reasons, M.D.    Dg Chest Port 1 View  12/31/2011  *RADIOLOGY REPORT*  Clinical Data: 56 year old female endotracheal tube.  Line placement.  Acute respiratory failure.  PORTABLE CHEST - 1 VIEW  Comparison: 12/31/2011 and earlier.  Findings: Semi upright AP portable view 1429 hours.  The patient is known debated.  Endotracheal tube tip is in good position between the level of clavicles and carina.  Enteric tube courses to the abdomen, partially looped within the known hiatal hernia but terminates below the level of the diaphragm (probably distal stomach).  Right IJ central line placed, distal tip felt to be at the T6-T7 level corresponding to the SVC.  Linear opacity below the level is felt related the spinous processes.  Bilateral pleural effusions.  Bibasilar collapse or consolidation. No pneumothorax.  Stable increased interstitial markings which may relate to vascular congestion.  Stable cardiac size and mediastinal contours.  IMPRESSION: 1.  Endotracheal tube and enteric tube appear in good position. 2.  Right IJ central line tip felt to terminate at the SVC level, linear opacity below the level felt related to the spinous processes. 3.  Bilateral pleural effusions and lower lobe collapse consolidation with superimposed  vascular congestion.   Original Report Authenticated By: Harley Hallmark, M.D.    Dg Chest Port 1 View  12/31/2011  *RADIOLOGY REPORT*  Clinical Data: Shortness of breath.  PORTABLE CHEST - 1 VIEW  Comparison: 12/30/2011  Findings: Shallow inspiration.  Cardiac enlargement with pulmonary vascular congestion and developing perihilar edema more prominent since previous study.  Small bilateral pleural effusions with basilar atelectasis or infiltration.  No pneumothorax.  IMPRESSION: Increasing pulmonary vascular congestion and edema with small bilateral pleural effusions and basilar atelectasis filtration.   Original Report Authenticated By: Marlon Pel, M.D.    Dg Abd Acute W/chest  12/26/2011  *RADIOLOGY REPORT*  Clinical Data: Epigastric pain and nausea.  ACUTE ABDOMEN SERIES (ABDOMEN 2 VIEW & CHEST 1 VIEW)  Comparison: CT abdomen and pelvis 09/27/2011.  Findings: Shallow inspiration with atelectasis or infiltration in the lung bases.  Normal heart size and pulmonary vascularity.  No blunting of costophrenic angles.  No pneumothorax.  Mediastinal contours are intact.  Calcification of the aorta.  Esophageal hiatal hernia behind the heart.  Scattered gas and stool in the colon.  No small or large bowel distension.  No abnormal air fluid levels.  No free air in the abdomen.  Surgical clips in the right upper quadrant.  Degenerative changes in the spine and hips.  IMPRESSION: Shallow inspiration with infiltration or atelectasis in the lung bases.  Esophageal hiatal hernia.  Nonobstructing bowel gas pattern.   Original Report Authenticated By: Marlon Pel, M.D.    Dg Intro Long Gi Tube  01/05/2012  *RADIOLOGY REPORT*  Clinical Data: Feeding tube placement.  DG INTRO LONG GI TUBE  Technique:  Feeding tube placement under fluoroscopy guidance  Fluoroscopy time: 21 seconds  Comparison:  01/02/2012 CT  Findings:  Under real time observation, feeding tube advanced distal to the hiatal hernia, into the  intra-abdominal stomach. Unable to advance into the duodenum.  IMPRESSION: Feeding tube projects over the mid stomach. This is a suboptimal position for tube feeds, however recommend a follow-up radiograph to evaluate for distal migration of the tube via peristalsis.   Original Report Authenticated By: Waneta Martins, M.D.     Medications: I have reviewed the patient's current medications. Scheduled Meds:    . antiseptic oral rinse  15 mL Mouth Rinse QID  . chlorhexidine  15 mL Mouth Rinse BID  . enoxaparin (LOVENOX) injection  40 mg Subcutaneous Q24H  . folic acid  1 mg Intravenous Daily  . furosemide  20 mg Intravenous Once  . furosemide  20 mg Oral Daily  . insulin aspart  2-6 Units Subcutaneous Q6H  . LORazepam  1 mg Intravenous Q8H  . metronidazole  500 mg Intravenous Q8H  . octreotide  100 mcg Subcutaneous Q8H  . pantoprazole (PROTONIX) IV  40 mg Intravenous Q12H  . piperacillin-tazobactam (ZOSYN)  IV  3.375 g Intravenous Q8H   Continuous Infusions:    . sodium chloride 20 mL/hr (01/13/12 0042)  . TPN (CLINIMIX) +/- additives 80 mL/hr at 01/22/12 1723   And  . fat emulsion 240 mL (01/22/12 1723)  . TPN (CLINIMIX) +/- additives 80 mL/hr at 01/21/12 1713   PRN Meds:.acetaminophen, HYDROmorphone (DILAUDID) injection, menthol-cetylpyridinium, ondansetron (ZOFRAN) IV, oxyCODONE-acetaminophen, polyvinyl alcohol, sodium chloride Assessment/Plan: Patient Active Hospital Problem List:  Ruptured pancreatic pseudocyst with peritonitis:  - patient has had a very complicated course. Currently she is on day #16 no Flagyl day #20 of Zosyn. The duration of antibiotic treatment will be determined by Gen. surgery and is unspecified at this time. -Patient had a new area of collection in the abdomen and had a JP drain placed by interventional radiology yesterday morning which has had about 30 cc of fluid out. -Nutrition management per surgery   Anemia: -s/p transfusion of one unit packed  blood cells on 9/28 -Hemoglobin drifted down to 7.2 today. Patient currently receiving blood transfusion per surgery.  -We'll continue to monitor     Gastric ulcer: -Patient status post EGD which showed gastric ulcer.  -Continue twice a day PPI treatment   C. difficile colitis: -As noted above patient on day #16 of Flagyl which is a completed course of treatment for C. Difficile -Repeat C. difficile from 9/29 still pending as patient has had no further stool.    the majority this patient's care is under that surgery. I last surgeon is to assume care of this patient as we're adding nothing to the patient's therapeutic plan  and management   LOS: 27 days

## 2012-01-22 NOTE — Progress Notes (Signed)
Physical Therapy Treatment Patient Details Name: [name] MRN: [id number] DOB: 07-02-1955 Today's Date: 01/22/2012 Time: 4098-1191 PT Time Calculation (min): 26 min  PT Assessment / Plan / Recommendation Comments on Treatment Session  Slow and steady progress is being made. Continue to recommend SNF for rehab.     Follow Up Recommendations  Post acute inpatient rehab    Barriers to Discharge        Equipment Recommendations  Rolling walker with 5" wheels;3 in 1 bedside comode    Recommendations for Other Services    Frequency Min 3X/week   Plan Discharge plan remains appropriate    Precautions / Restrictions Precautions Precautions: Fall Precaution Comments: 3 abdominal drains and wound vac Restrictions Weight Bearing Restrictions: No   Pertinent Vitals/Pain 10/10 abdomen while sitting EOB. RN gave meds before ambulation    Mobility  Bed Mobility Bed Mobility: Rolling Right;Right Sidelying to Sit Rolling Right: 5: Supervision Right Sidelying to Sit: 4: Min assist Details for Bed Mobility Assistance: Assist for trunk to upright. VCs safety, technique, hand placement Transfers Transfers: Sit to Stand;Stand to Sit Sit to Stand: 4: Min assist;4: Min guard;From bed;From chair/3-in-1 Stand to Sit: 4: Min guard;4: Min assist;To chair/3-in-1 Details for Transfer Assistance: x 2. VCs safety, technique, hand placement. A needed to rise from bed, but not recliner. A needed to control descent only when pt does not use hands/armrests. Ambulation/Gait Ambulation/Gait Assistance: 4: Min assist Ambulation Distance (Feet): 55 Feet (x 2.) Ambulation/Gait Assistance Details: VCs safety, distance from RW, posture. Assist to stabilize throughout ambulation and to maneuver RW intermittently. Seated rest break between walks.  Gait Pattern: Step-through pattern;Decreased stride length;Decreased step length - left;Decreased step length - right;Trunk flexed;Narrow base of support      Exercises General Exercises - Lower Extremity Ankle Circles/Pumps: AROM;Both;10 reps;Seated Quad Sets: AROM;Seated;10 reps;Both Long Arc Quad: AROM;Both;10 reps;Seated Hip ABduction/ADduction: AROM;Both;10 reps;Seated   PT Diagnosis:    PT Problem List:   PT Treatment Interventions:     PT Goals Acute Rehab PT Goals Pt will go Supine/Side to Sit: with supervision PT Goal: Supine/Side to Sit - Progress: Progressing toward goal Pt will go Sit to Stand: with supervision PT Goal: Sit to Stand - Progress: Progressing toward goal Pt will go Stand to Sit: with supervision PT Goal: Stand to Sit - Progress: Progressing toward goal Pt will Ambulate: 16 - 50 feet;with min assist;with rolling walker PT Goal: Ambulate - Progress: Progressing toward goal  Visit Information  Last PT Received On: 01/22/12 Assistance Needed: +1    Subjective Data  Subjective: "I live alone. I can't go home by myself" Patient Stated Goal: Get better. Less pain   Cognition  Overall Cognitive Status: Appears within functional limits for tasks assessed/performed Arousal/Alertness: Awake/alert Orientation Level: Appears intact for tasks assessed Behavior During Session: [facility] for tasks performed    Balance     End of Session PT - End of Session Activity Tolerance: Patient limited by fatigue;Patient tolerated treatment well Patient left: in chair;with call bell/phone within reach   GP     Rebeca Alert [facility] 01/22/2012, 11:54 AM [phone]

## 2012-01-22 NOTE — Progress Notes (Signed)
Subjective: Pt ok, feeling some better today. C/o occasional soreness with 'all drains'  Objective: Physical Exam: BP 104/57  Pulse 87  Temp 98.6 F (37 C) (Oral)  Resp 18  Ht 5\' 6"  (1.676 m)  Wt 166 lb 10.7 oz (75.6 kg)  BMI 26.90 kg/m2  SpO2 95%  LMP 12/08/2006  Rt abd drain(IR) intact, site clean, mildly sore at suture. Output cloudy yellow, 75cc recorded yesterday   Labs: CBC  Basename 01/22/12 0800 01/21/12 0950  WBC 11.2* 13.3*  HGB 9.9* 7.7*  HCT 30.1* 25.0*  PLT 483* 568*   BMET  Basename 01/22/12 0800 01/21/12 0500  NA 132* 134*  K 3.8 3.7  CL 98 102  CO2 26 24  GLUCOSE 175* 110*  BUN 11 12  CREATININE 0.53 0.52  CALCIUM 8.2* 8.1*   LFT  Basename 01/21/12 0500  PROT 6.6  ALBUMIN 1.7*  AST 23  ALT 12  ALKPHOS 91  BILITOT 0.3  BILIDIR --  IBILI --  LIPASE --   PT/INR No results found for this basename: LABPROT:2,INR:2 in the last 72 hours   Studies/Results: No results found.  Assessment/Plan: s/p right abd /perit fluid coll drainage 10/1 Output starting to decline Cx pending, WBC slowly trending to normal Once output declines, repeat imaging to reassess Will follow along.    LOS: 27 days    Brayton El PA-C 01/22/2012 10:13 AM

## 2012-01-22 NOTE — Progress Notes (Signed)
PARENTERAL NUTRITION CONSULT NOTE - FOLLOW UP  Pharmacy Consult for TNA Indication: Severe Pancreatitis  Allergies  Allergen Reactions  . Ciprofloxacin Nausea Only    REACTION: nausea   Patient Measurements: Height: 5\' 6"  (167.6 cm) Weight: 166 lb 10.7 oz (75.6 kg) IBW/kg (Calculated) : 59.3   Vital Signs: Temp: 99.3 F (37.4 C) (10/04 0630) Temp src: Oral (10/04 0630) BP: 109/66 mmHg (10/04 0630) Pulse Rate: 86  (10/04 0630) Intake/Output from previous day: 10/03 0701 - 10/04 0700 In: 5568 [P.O.:460; I.V.:1391.7; Blood:675; IV Piggyback:1200; TPN:1841.3] Out: 95 [Drains:95] Intake/Output from this shift:    Labs:  [name] 01/22/12 0800 01/21/12 0950 01/20/12 0450  WBC 11.2* 13.3* 13.9*  HGB 9.9* 7.7* 7.2*  HCT 30.1* 25.0* 23.0*  PLT 483* 568* 538*  APTT -- -- --  INR -- -- --    Basename 01/22/12 0800 01/21/12 0500 01/20/12 0450  NA 132* 134* 132*  K 3.8 3.7 3.7  CL 98 102 101  CO2 26 24 22   GLUCOSE 175* 110* 104*  BUN 11 12 15   CREATININE 0.53 0.52 0.48*  LABCREA -- -- --  CREAT24HRUR -- -- --  CALCIUM 8.2* 8.1* 8.1*  MG -- 2.0 1.8  PHOS -- 3.5 --  PROT -- 6.6 6.4  ALBUMIN -- 1.7* 1.7*  AST -- 23 28  ALT -- 12 13  ALKPHOS -- 91 101  BILITOT -- 0.3 0.3  BILIDIR -- -- --  IBILI -- -- --  PREALBUMIN -- -- --  TRIG -- -- --  CHOLHDL -- -- --  CHOL -- -- --  Corrected Calcium = 10 Estimated Creatinine Clearance: 81.6 ml/min (by C-G formula based on Cr of 0.53).    Basename 01/22/12 0645 01/22/12 0003 01/21/12 1824  GLUCAP 113* 135* 134*   Insulin Requirements in the past 24 hours:  CBGs < 150, required 4 units of SSI last 24h  Nutritional Goals:  RD recs 9/16: 1762 kcal, 86-95 grams protein Reestimated s/p extubation 9/30: 1475-1775 kcal, 94-110 grams protein Clinimix 5/15 @ 35ml/hr + Lipid 20% MWF will provide 96 g protein/day, 1843 kCal on lipid days, 1363 Kcal on non-lipid days for an average of 1569 Kcal per day/week.  Current  nutrition:  Diet: Clear Liquid started 10/3  mIVF: NS @ KVO Clinimix E 5/15 @ 80 ml/hr +  IVFE 20% @ 10 ml/hr on MWF  Assessment:  [name] with h/o recurrent pancreatitis and ETOH abuse, s/p ERCP with sphincterotomy on 10/06/2011 here this admit with acute pancreatitis, developed ileus. Rapid response called 9/12 for respiratory decline and lethargy - pt transfer to ICU and CCM consulted. Intubated 9/12. 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   Pt underwent exploratory laparotomy with debridement of necrotic pancreas and drainage of infected, ruptured pseudocyst 9/15. TNA started 9/16.  9/30 CT abd = extensive loculated fluid in both sides of the abdomen consistent with a pseudocyst.  Additional drained (3rd) placed by IR 10/1. Plan f/u CT once output declines. Drain output improving, 95 ml yesterday. Diet advanced to clear liquids.  Labs Renal: Scr wnl/stable Hepatic fxn: AST/ALT previously elevated ?shock liver 2/2 pancreatitis, now WNL Electrolytes: Na+ slightly low but unable to adjust in TNA, other lytes wnl Pre-Alb: improving, 2.3 (9/17), 12.2 (9/23), 11.4 (9/30) TG/Cholesterol: WNL on  9/17, 9/23, 9/30 CBGs: at goal, requiring minimal insulin   Plan:    Continue clinimix to E 5/15 at 80 ml/hr, latest prealbumin  reveals maintaining protein stores (level remains mod decreased)  PO advancement per CCS, will monitor  TNA to contain IV fat emulsion, standard multivitamins and trace elements only on MWF only due to ongoing shortage   TNA labs Monday/Thursdays  Pharmacy will follow up  Geoffry Paradise, PharmD, BCPS Pager: [phone] 8:57 AM Pharmacy #: [phone]

## 2012-01-22 NOTE — Progress Notes (Signed)
Slowly improving

## 2012-01-23 DIAGNOSIS — E440 Moderate protein-calorie malnutrition: Secondary | ICD-10-CM

## 2012-01-23 LAB — BODY FLUID CULTURE: Culture: NO GROWTH

## 2012-01-23 LAB — GLUCOSE, CAPILLARY
Glucose-Capillary: 98 mg/dL (ref 70–99)
Glucose-Capillary: 133 mg/dL — ABNORMAL HIGH (ref 70–99)

## 2012-01-23 MED ORDER — ACETAMINOPHEN 500 MG PO TABS
1000.0000 mg | ORAL_TABLET | Freq: Three times a day (TID) | ORAL | Status: DC
  Administered 2012-01-23 – 2012-02-09 (×18): 1000 mg via ORAL
  Filled 2012-01-23 (×61): qty 2

## 2012-01-23 MED ORDER — MENTHOL 3 MG MT LOZG
1.0000 | LOZENGE | OROMUCOSAL | Status: DC | PRN
  Filled 2012-01-23: qty 9

## 2012-01-23 MED ORDER — LIP MEDEX EX OINT
1.0000 "application " | TOPICAL_OINTMENT | Freq: Two times a day (BID) | CUTANEOUS | Status: DC
  Administered 2012-01-23 – 2012-02-09 (×35): 1 via TOPICAL
  Filled 2012-01-23: qty 7

## 2012-01-23 MED ORDER — RISAQUAD PO CAPS
1.0000 | ORAL_CAPSULE | Freq: Every day | ORAL | Status: DC
  Administered 2012-01-23 – 2012-02-09 (×18): 1 via ORAL
  Filled 2012-01-23 (×18): qty 1

## 2012-01-23 MED ORDER — OXYCODONE HCL 5 MG PO TABS
5.0000 mg | ORAL_TABLET | ORAL | Status: DC | PRN
  Administered 2012-01-26 – 2012-01-27 (×3): 10 mg via ORAL
  Filled 2012-01-23 (×3): qty 2

## 2012-01-23 MED ORDER — MAGIC MOUTHWASH
15.0000 mL | Freq: Four times a day (QID) | ORAL | Status: DC | PRN
  Filled 2012-01-23: qty 15

## 2012-01-23 MED ORDER — DIPHENHYDRAMINE HCL 50 MG/ML IJ SOLN
12.5000 mg | Freq: Four times a day (QID) | INTRAMUSCULAR | Status: DC | PRN
  Administered 2012-02-06: 12.5 mg via INTRAVENOUS
  Administered 2012-02-07 – 2012-02-09 (×3): 25 mg via INTRAVENOUS
  Filled 2012-01-23 (×4): qty 1

## 2012-01-23 MED ORDER — CLINIMIX E/DEXTROSE (5/15) 5 % IV SOLN
INTRAVENOUS | Status: AC
  Administered 2012-01-23: 17:00:00 via INTRAVENOUS
  Filled 2012-01-23: qty 2000

## 2012-01-23 MED ORDER — PIPERACILLIN-TAZOBACTAM 3.375 G IVPB
3.3750 g | Freq: Three times a day (TID) | INTRAVENOUS | Status: AC
  Administered 2012-01-23 – 2012-01-30 (×21): 3.375 g via INTRAVENOUS
  Filled 2012-01-23 (×21): qty 50

## 2012-01-23 MED ORDER — LIP MEDEX EX OINT
TOPICAL_OINTMENT | CUTANEOUS | Status: AC
  Filled 2012-01-23: qty 7

## 2012-01-23 MED ORDER — OXYCODONE HCL 5 MG PO TABS: 5.0000 mg | ORAL_TABLET | ORAL | Status: DC | PRN

## 2012-01-23 MED ORDER — HYDROMORPHONE HCL PF 1 MG/ML IJ SOLN
1.0000 mg | INTRAMUSCULAR | Status: DC | PRN
  Administered 2012-01-23 – 2012-01-24 (×4): 1 mg via INTRAVENOUS
  Administered 2012-01-24: 2 mg via INTRAVENOUS
  Administered 2012-01-24 (×4): 1 mg via INTRAVENOUS
  Administered 2012-01-25: 2 mg via INTRAVENOUS
  Administered 2012-01-25 (×2): 1 mg via INTRAVENOUS
  Administered 2012-01-25 (×2): 2 mg via INTRAVENOUS
  Administered 2012-01-25: 1 mg via INTRAVENOUS
  Administered 2012-01-25: 2 mg via INTRAVENOUS
  Administered 2012-01-25: 1 mg via INTRAVENOUS
  Administered 2012-01-25 – 2012-01-26 (×4): 2 mg via INTRAVENOUS
  Administered 2012-01-26: 1 mg via INTRAVENOUS
  Administered 2012-01-26: 2 mg via INTRAVENOUS
  Administered 2012-01-26: 1 mg via INTRAVENOUS
  Administered 2012-01-26 (×3): 2 mg via INTRAVENOUS
  Administered 2012-01-27 (×2): 1 mg via INTRAVENOUS
  Administered 2012-01-27: 2 mg via INTRAVENOUS
  Administered 2012-01-27 (×3): 1 mg via INTRAVENOUS
  Administered 2012-01-28 – 2012-01-29 (×9): 2 mg via INTRAVENOUS
  Filled 2012-01-23 (×3): qty 1
  Filled 2012-01-23 (×2): qty 2
  Filled 2012-01-23: qty 1
  Filled 2012-01-23: qty 2
  Filled 2012-01-23 (×4): qty 1
  Filled 2012-01-23 (×2): qty 2
  Filled 2012-01-23: qty 1
  Filled 2012-01-23: qty 2
  Filled 2012-01-23: qty 1
  Filled 2012-01-23 (×4): qty 2
  Filled 2012-01-23 (×4): qty 1
  Filled 2012-01-23: qty 2
  Filled 2012-01-23 (×2): qty 1
  Filled 2012-01-23 (×3): qty 2
  Filled 2012-01-23: qty 1
  Filled 2012-01-23 (×3): qty 2
  Filled 2012-01-23: qty 1
  Filled 2012-01-23 (×4): qty 2
  Filled 2012-01-23 (×2): qty 1
  Filled 2012-01-23 (×2): qty 2
  Filled 2012-01-23: qty 1

## 2012-01-23 NOTE — Progress Notes (Signed)
17 Days Post-Op  Subjective: abd abscess drain placed 10/1 Better   Objective: Vital signs in last 24 hours: Temp:  [98.9 F (37.2 C)-99.1 F (37.3 C)] 99.1 F (37.3 C) (10/05 0555) Pulse Rate:  [83-86] 86  (10/05 0555) Resp:  [20] 20  (10/05 0555) BP: (102-116)/(61-75) 102/61 mmHg (10/05 0555) SpO2:  [93 %-95 %] 93 % (10/05 0555) Last BM Date: 01/20/12  Intake/Output from previous day: 10/04 0701 - 10/05 0700 In: -  Out: 1840 [Urine:1750; Drains:90] Intake/Output this shift: Total I/O In: 1350 [I.V.:30; IV Piggyback:150; TPN:1170] Out: -   PE:  VSS; afeb Output from IR drain 50 cc yesterday 25 cc in bag now: mucous/yellow Site clean and dry Cx neg  Lab Results:   Basename 01/22/12 0800 01/21/12 0950  WBC 11.2* 13.3*  HGB 9.9* 7.7*  HCT 30.1* 25.0*  PLT 483* 568*   BMET  Basename 01/22/12 0800 01/21/12 0500  NA 132* 134*  K 3.8 3.7  CL 98 102  CO2 26 24  GLUCOSE 175* 110*  BUN 11 12  CREATININE 0.53 0.52  CALCIUM 8.2* 8.1*   PT/INR No results found for this basename: LABPROT:2,INR:2 in the last 72 hours ABG No results found for this basename: PHART:2,PCO2:2,PO2:2,HCO3:2 in the last 72 hours  Studies/Results: Dg Chest 2 View  01/22/2012  *RADIOLOGY REPORT*  Clinical Data: There is of breath with bilateral effusions.  Chest pain with deep inspiration  CHEST - 2 VIEW  Comparison: 01/13/2012  Findings: A right internal jugular CVP is unchanged in position with the tip projecting over the upper right atrium. Low lung volumes are present and taking this into consideration, heart and mediastinal contours are stable with the heart at the upper limits of normal in size and mild aortic ectasia noted.  Bilateral pleural effusions are present with associated bibasilar atelectasis/infiltrate. No interval change in the overall pattern is noted in comparison with the most recent exam.  The upper lung zones remain clear.  A surgical drain is noted overlying the left upper  quadrant and surgical clips are seen in the right upper quadrant.  IMPRESSION: Bilateral pleural effusions with unchanged bibasilar atelectasis/infiltrate.   Original Report Authenticated By: Bertha Stakes, M.D.     Anti-infectives: Anti-infectives     Start     Dose/Rate Route Frequency Ordered Stop   01/03/12 1600   metroNIDAZOLE (FLAGYL) IVPB 500 mg        500 mg 100 mL/hr over 60 Minutes Intravenous Every 8 hours 01/03/12 1525     12/31/11 1100   vancomycin (VANCOCIN) 750 mg in sodium chloride 0.9 % 150 mL IVPB  Status:  Discontinued        750 mg 150 mL/hr over 60 Minutes Intravenous Every 12 hours 12/31/11 0958 01/03/12 0849   12/30/11 1000   piperacillin-tazobactam (ZOSYN) IVPB 3.375 g        3.375 g 12.5 mL/hr over 240 Minutes Intravenous Every 8 hours 12/30/11 0901            Assessment/Plan: s/p Procedure(s) (LRB) with comments: ESOPHAGOGASTRODUODENOSCOPY (EGD) (N/A) - bedside   LOS: 28 days   RLQ abscess drain placed in IR 10/1 Feels some better today Drain intact and significant output Will follow for now  Tykesha Konicki A 01/23/2012

## 2012-01-23 NOTE — Progress Notes (Signed)
RIJ CVC removed per order.  No evidence of bleeding or infection at insertion site.  Suture site with slight redness and inflammation present.  Petrolateum guaze and 4x4 placed over site after pressure held.  Pt verbalizes understanding of site care, to remove p 48 hrs, signs of infection and to call RN if bleeding noted via Teachback method.  Marcelino Duster, RN aware of need for VS and intervention.

## 2012-01-23 NOTE — Progress Notes (Signed)
PARENTERAL NUTRITION CONSULT NOTE - FOLLOW UP  Pharmacy Consult for TNA Indication: Severe Pancreatitis  Allergies  Allergen Reactions  . Ciprofloxacin Nausea Only    REACTION: nausea   Patient Measurements: Height: 5\' 6"  (167.6 cm) Weight: 166 lb 10.7 oz (75.6 kg) IBW/kg (Calculated) : 59.3   Vital Signs: Temp: 99.1 F (37.3 C) (10/05 0555) Temp src: Oral (10/05 0555) BP: 102/61 mmHg (10/05 0555) Pulse Rate: 86  (10/05 0555) Intake/Output from previous day: 10/04 0701 - 10/05 0700 In: -  Out: 1800 [Urine:1750; Drains:50] Intake/Output from this shift: Total I/O In: -  Out: 1050 [Urine:1000; Drains:50]  Labs:  [facility] 01/22/12 0800 01/21/12 0950  WBC 11.2* 13.3*  HGB 9.9* 7.7*  HCT 30.1* 25.0*  PLT 483* 568*  APTT -- --  INR -- --    Basename 01/22/12 0800 01/21/12 0500  NA 132* 134*  K 3.8 3.7  CL 98 102  CO2 26 24  GLUCOSE 175* 110*  BUN 11 12  CREATININE 0.53 0.52  LABCREA -- --  CREAT24HRUR -- --  CALCIUM 8.2* 8.1*  MG -- 2.0  PHOS -- 3.5  PROT -- 6.6  ALBUMIN -- 1.7*  AST -- 23  ALT -- 12  ALKPHOS -- 91  BILITOT -- 0.3  BILIDIR -- --  IBILI -- --  PREALBUMIN -- --  TRIG -- --  CHOLHDL -- --  CHOL -- --  Corrected Calcium = 10 Estimated Creatinine Clearance: 81.6 ml/min (by C-G formula based on Cr of 0.53).    Basename 01/23/12 0600 01/22/12 1811 01/22/12 0645  GLUCAP 137* 148* 113*   Insulin Requirements in the past 24 hours:  CBGs < 150, required 6 units of SSI last 24h  Nutritional Goals:  RD recs 9/16: 1762 kcal, 86-95 grams protein Reestimated s/p extubation 9/30: 1475-1775 kcal, 94-110 grams protein Clinimix 5/15 @ 62ml/hr + Lipid 20% MWF will provide 96 g protein/day, 1843 kCal on lipid days, 1363 Kcal on non-lipid days for an average of 1569 Kcal per day/week.  Current nutrition:  Diet: Clear Liquid started 10/3  mIVF: NS @ KVO Clinimix E 5/15 @ 80 ml/hr +  IVFE 20% @ 10 ml/hr on MWF  Assessment:  [name] with h/o  recurrent pancreatitis and ETOH abuse, s/p ERCP with sphincterotomy on 10/06/2011 here this admit with acute pancreatitis, developed ileus. Rapid response called 9/12 for respiratory decline and lethargy - pt transfer to ICU and CCM consulted. Intubated 9/12. 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   Pt underwent exploratory laparotomy with debridement of necrotic pancreas and drainage of infected, ruptured pseudocyst 9/15. TNA started 9/16.  9/30 CT abd = extensive loculated fluid in both sides of the abdomen consistent with a pseudocyst.  Additional drained (3rd) placed by IR 10/1. Plan f/u CT once output declines. Drain output improving, no output recorded 10/4. Diet advanced to clear liquids but no intake recorded 10/4.  Labs: Renal: Scr wnl/stable Hepatic fxn: AST/ALT previously elevated ?shock liver 2/2 pancreatitis, now WNL Electrolytes: Na+ slightly low but unable to adjust in TNA, other lytes wnl Pre-Alb: improving, 2.3 (9/17), 12.2 (9/23), 11.4 (9/30) TG/Cholesterol: WNL on  9/17, 9/23, 9/30 CBGs: at goal, requiring minimal insulin   Plan:    Continue clinimix to E 5/15 at 80 ml/hr, latest prealbumin reveals maintaining protein stores (level remains mod decreased)  PO advancement per CCS, will monitor  TNA to contain IV fat emulsion, standard multivitamins and  trace elements only on MWF only due to ongoing shortage   TNA labs Monday/Thursdays  Pharmacy will follow up  Loralee Pacas, PharmD, BCPS Pager: [phone] 01/23/2012 6:49 AM

## 2012-01-23 NOTE — Progress Notes (Addendum)
[name] [id number] 27-Jun-1955   Subjective:  Sore Afraid to get up with pain but wants to try more No nausea  Objective:  Vital signs:  Filed Vitals:   01/21/12 2230 01/22/12 0630 01/22/12 1405 01/23/12 0555  BP: 110/70 109/66 116/75 102/61  Pulse: 86 86 83 86  Temp: 100 F (37.8 C) 99.3 F (37.4 C) 98.9 F (37.2 C) 99.1 F (37.3 C)  TempSrc: Oral Oral Oral Oral  Resp: 18 18 20 20   Height:      Weight:      SpO2:  92% 95% 93%    Last BM Date: 01/23/12  Intake/Output   Yesterday:  10/04 0701 - 10/05 0700 In: -  Out: 1840 [Urine:1750; Drains:90] This shift:  Total I/O In: 1350 [I.V.:30; IV Piggyback:150; TPN:1170] Out: -   Bowel function:  Flatus: y  BM: n  Physical Exam:  General: Pt awake/alert/oriented x4 in no acute distress Eyes: PERRL, normal EOM.  Sclera clear.  No icterus Neuro: CN II-XII intact w/o focal sensory/motor deficits. Lymph: No head/neck/groin lymphadenopathy Psych:  No delerium/psychosis/paranoia HENT: Normocephalic, Mucus membranes moist.  No thrush Neck: Supple, No tracheal deviation Chest: No chest wall pain w good excursion CV:  Pulses intact.  Regular rhythm Abdomen: Soft.  Nondistended.  Mildly tender at incisions only.  No incarcerated hernias.  Wound vac in place.  Drains with thick tan output Ext:  SCDs BLE.  No mjr edema.  No cyanosis Skin: No petechiae / purpurae  Problem List:  Principal Problem:  *Pancreatitis caused by a combination of alcoholism, obstruction of the common bile duct by blood clots and a small stone, and extrinsic compression of the common bile duct by pancreatic pseudocyst Active Problems:  Alcohol abuse  Pseudocyst of pancreas  Acute pancreatitis  Epigastric abdominal pain  Leukocytosis  Hyponatremia  Acute respiratory failure  Shock  ARDS (adult respiratory distress syndrome)  C. difficile colitis  S/P laparotomy  Gastric ulcer  Physical deconditioning  Anemia  Protein-calorie malnutrition, moderate   Assessment  [name]  56 y.o. female  17 Days Post-Op    Procedure(s): Pancreatic debridement  Slowly recovering  Plan:  -inc nonnarcotic pain control -TNA as primary nutrition - severe malnutrition slowing recovery -cont wound vac -d/c neck central line with new PICC -PT/OT/Rehab -ABx 7 more days s/p drainage of last abscess.  Add fluconazole if Candida found.  Flagyl redundant w Zosyn - d/c -VTE prophylaxis- SCDs, etc -mobilize as tolerated to help recovery  Will f/u Monday = 2 days  Ardeth Sportsman, M.D., F.A.C.S. Gastrointestinal and Minimally Invasive Surgery [facility] [address], [address] [place], [place] [address] [phone] Main / Paging [phone] Voice Mail   01/23/2012  CARE TEAM:  PCP: Thayer Headings, MD  Outpatient Care Team: Patient Care Team: Thayer Headings, MD as PCP - General (Internal Medicine)  Inpatient Treatment Team: Treatment Team: Attending Provider: Altha Harm, MD; Consulting Physician: Bishop Limbo, MD; Social Worker: Vennie Homans, [place]; Attending Physician: Alison Murray, MD; Registered Nurse: Virl Cagey, RN; Technician: Pearline Cables, NT; Registered Nurse: Para March, RN; Case Manager: Trish Mage, RN; Dietitian: Lavena Bullion, RD; Technician: Fayrene Helper, NT; Rounding Team: Delmer Islam, MD; Registered Nurse: Threasa Heads, RN; Technician: Beatriz Stallion, NT; Technician: Philis Nettle, NT; Respiratory Therapist: Renold Genta, RRT; Registered Nurse: Christianne Borrow, RN; Technician: Berlinda Last, NT   Results:   Labs:  Results for orders placed during the hospital encounter of 12/26/11 (from the past 48 hour(s))  GLUCOSE, CAPILLARY     Status: Abnormal   Collection Time   01/21/12 11:54 AM      Component Value Range Comment   Glucose-Capillary 128 (*) 70 - 99  mg/dL    Comment 1 Notify RN     PREPARE RBC (CROSSMATCH)     Status: Normal   Collection Time   01/21/12 12:00 PM      Component Value Range Comment   Order Confirmation ORDER PROCESSED BY BLOOD BANK     GLUCOSE, CAPILLARY     Status: Abnormal   Collection Time   01/21/12  6:24 PM      Component Value Range Comment   Glucose-Capillary 134 (*) 70 - 99 mg/dL   GLUCOSE, CAPILLARY     Status: Abnormal   Collection Time   01/22/12 12:03 AM      Component Value Range Comment   Glucose-Capillary 135 (*) 70 - 99 mg/dL    Comment 1 Notify RN     GLUCOSE, CAPILLARY     Status: Abnormal   Collection Time   01/22/12  6:45 AM      Component Value Range Comment   Glucose-Capillary 113 (*) 70 - 99 mg/dL   CBC     Status: Abnormal   Collection Time   01/22/12  8:00 AM      Component Value Range Comment   WBC 11.2 (*) 4.0 - 10.5 K/uL    RBC 3.44 (*) 3.87 - 5.11 MIL/uL    Hemoglobin 9.9 (*) 12.0 - 15.0 g/dL    HCT 16.1 (*) 09.6 - 46.0 %    MCV 87.5  78.0 - 100.0 fL    MCH 28.8  26.0 - 34.0 pg    MCHC 32.9  30.0 - 36.0 g/dL    RDW 04.5 (*) 40.9 - 15.5 %    Platelets 483 (*) 150 - 400 K/uL   BASIC METABOLIC PANEL     Status: Abnormal   Collection Time   01/22/12  8:00 AM      Component Value Range Comment   Sodium 132 (*) 135 - 145 mEq/L    Potassium 3.8  3.5 - 5.1 mEq/L    Chloride 98  96 - 112 mEq/L    CO2 26  19 - 32 mEq/L    Glucose, Bld 175 (*) 70 - 99 mg/dL    BUN 11  6 - 23 mg/dL    Creatinine, Ser 8.11  0.50 - 1.10 mg/dL    Calcium 8.2 (*) 8.4 - 10.5 mg/dL    GFR calc non Af Amer >90  >90 mL/min    GFR calc Af Amer >90  >90 mL/min   GLUCOSE, CAPILLARY     Status: Normal   Collection Time   01/22/12 11:35 AM      Component Value Range Comment   Glucose-Capillary 98  70 - 99 mg/dL    Comment 1 Notify RN     GLUCOSE, CAPILLARY     Status: Abnormal   Collection Time   01/22/12  6:11 PM      Component Value Range Comment   Glucose-Capillary 148 (*) 70 - 99 mg/dL   GLUCOSE,  CAPILLARY     Status: Abnormal   Collection Time   01/23/12 12:19 AM      Component Value Range Comment   Glucose-Capillary 125 (*) 70 - 99 mg/dL   GLUCOSE, CAPILLARY  Status: Abnormal   Collection Time   01/23/12  6:00 AM      Component Value Range Comment   Glucose-Capillary 137 (*) 70 - 99 mg/dL   GLUCOSE, CAPILLARY     Status: Abnormal   Collection Time   01/23/12  7:42 AM      Component Value Range Comment   Glucose-Capillary 147 (*) 70 - 99 mg/dL     Imaging / Studies: Dg Chest 2 View  01/22/2012  *RADIOLOGY REPORT*  Clinical Data: There is of breath with bilateral effusions.  Chest pain with deep inspiration  CHEST - 2 VIEW  Comparison: 01/13/2012  Findings: A right internal jugular CVP is unchanged in position with the tip projecting over the upper right atrium. Low lung volumes are present and taking this into consideration, heart and mediastinal contours are stable with the heart at the upper limits of normal in size and mild aortic ectasia noted.  Bilateral pleural effusions are present with associated bibasilar atelectasis/infiltrate. No interval change in the overall pattern is noted in comparison with the most recent exam.  The upper lung zones remain clear.  A surgical drain is noted overlying the left upper quadrant and surgical clips are seen in the right upper quadrant.  IMPRESSION: Bilateral pleural effusions with unchanged bibasilar atelectasis/infiltrate.   Original Report Authenticated By: Bertha Stakes, M.D.     Medications / Allergies: per chart  Antibiotics: Anti-infectives     Start     Dose/Rate Route Frequency Ordered Stop   01/03/12 1600   metroNIDAZOLE (FLAGYL) IVPB 500 mg        500 mg 100 mL/hr over 60 Minutes Intravenous Every 8 hours 01/03/12 1525     12/31/11 1100   vancomycin (VANCOCIN) 750 mg in sodium chloride 0.9 % 150 mL IVPB  Status:  Discontinued        750 mg 150 mL/hr over 60 Minutes Intravenous Every 12 hours 12/31/11 0958 01/03/12  0849   12/30/11 1000  piperacillin-tazobactam (ZOSYN) IVPB 3.375 g       3.375 g 12.5 mL/hr over 240 Minutes Intravenous Every 8 hours 12/30/11 0901

## 2012-01-23 NOTE — Progress Notes (Addendum)
Subjective: Patient states that she feels better today. In general she appears to have much more energy today and has been up several times at the nurses ambulating around the room.   Objective: Filed Vitals:   01/22/12 1405 01/23/12 0555 01/23/12 1428 01/23/12 1839  BP: 116/75 102/61 116/76 115/72  Pulse: 83 86 77 79  Temp: 98.9 F (37.2 C) 99.1 F (37.3 C) 98.4 F (36.9 C) 98.4 F (36.9 C)  TempSrc: Oral Oral Oral Oral  Resp: 20 20 18 18   Height:      Weight:      SpO2: 95% 93% 92% 93%   Weight change:   Intake/Output Summary (Last 24 hours) at 01/23/12 1905 Last data filed at 01/23/12 1721  Gross per 24 hour  Intake   1370 ml  Output   1090 ml  Net    280 ml    General: Alert, awake, oriented x3, in no acute distress.  HEENT: [place]/AT PEERL, EOMI Heart: Regular rate and rhythm.  Lungs: Clear to auscultation Abdomen: Mildly distended, positive bowel sounds. And won't with good granulation. Midline laparotomy incision with 2 JP drains and drainage. Neuro: No focal neurological deficits noted. Musculoskeletal: No warm swelling or erythema around joints, no spinal tenderness noted. I've so the patient ambulating around the room and she seems to be doing much better    Lab Results:  Basename 01/22/12 0800 01/21/12 0500  NA 132* 134*  K 3.8 3.7  CL 98 102  CO2 26 24  GLUCOSE 175* 110*  BUN 11 12  CREATININE 0.53 0.52  CALCIUM 8.2* 8.1*  MG -- 2.0  PHOS -- 3.5    Basename 01/21/12 0500  AST 23  ALT 12  ALKPHOS 91  BILITOT 0.3  PROT 6.6  ALBUMIN 1.7*   No results found for this basename: LIPASE:2,AMYLASE:2 in the last 72 hours  Basename 01/22/12 0800 01/21/12 0950  WBC 11.2* 13.3*  NEUTROABS -- --  HGB 9.9* 7.7*  HCT 30.1* 25.0*  MCV 87.5 90.3  PLT 483* 568*   No results found for this basename: CKTOTAL:3,CKMB:3,CKMBINDEX:3,TROPONINI:3 in the last 72 hours No components found with this basename: POCBNP:3 No results found for this basename: DDIMER:2  in the last 72 hours No results found for this basename: HGBA1C:2 in the last 72 hours No results found for this basename: CHOL:2,HDL:2,LDLCALC:2,TRIG:2,CHOLHDL:2,LDLDIRECT:2 in the last 72 hours No results found for this basename: TSH,T4TOTAL,FREET3,T3FREE,THYROIDAB in the last 72 hours No results found for this basename: VITAMINB12:2,FOLATE:2,FERRITIN:2,TIBC:2,IRON:2,RETICCTPCT:2 in the last 72 hours  Micro Results: Recent Results (from the past 240 hour(s))  ANAEROBIC CULTURE     Status: Normal (Preliminary result)   Collection Time   01/19/12  2:00 PM      Component Value Range Status Comment   Specimen Description PERITONEAL CAVITY   Final    Special Requests Normal   Final    Gram Stain     Final    Value: FEW WBC PRESENT,BOTH PMN AND MONONUCLEAR     NO ORGANISMS SEEN   Culture     Final    Value: NO ANAEROBES ISOLATED; CULTURE IN PROGRESS FOR 5 DAYS   Report Status PENDING   Incomplete   BODY FLUID CULTURE     Status: Normal   Collection Time   01/19/12  2:00 PM      Component Value Range Status Comment   Specimen Description FLUID PERITONEAL CAVITY   Final    Special Requests NONE   Final  Gram Stain     Final    Value: FEW WBC PRESENT,BOTH PMN AND MONONUCLEAR     NO ORGANISMS SEEN   Culture NO GROWTH 3 DAYS   Final    Report Status 01/23/2012 FINAL   Final     Studies/Results: Ct Abdomen Pelvis Wo Contrast  12/30/2011  *RADIOLOGY REPORT*  Clinical Data: Abdominal pain.  Nausea and vomiting.  Elevated liver function tests.  Follow-up acute pancreatitis.  CT ABDOMEN AND PELVIS WITHOUT CONTRAST  Technique:  Multidetector CT imaging of the abdomen and pelvis was performed following the standard protocol without intravenous contrast.  Comparison: 12/27/2011  Findings: New small bilateral pleural effusions and bibasilar atelectasis or infiltrates are seen.  A moderate size hiatal hernia is again demonstrated.  Moderate ascites within the abdomen and pelvis has significantly  increased since previous study.  New diffuse body wall edema also noted.  A persistent small focal fluid collection is seen between the posterior wall the gastric antrum and the pancreatic neck which measures approximately 2 x 3 cm and is not significantly changed. This is suspicious for a small pancreatic pseudocyst.  Swelling of the pancreatic head and peripancreatic inflammatory changes remain consistent with acute pancreatitis, but show no significant change.  Surgical clips again seen from prior cholecystectomy.  No definite evidence of biliary dilatation. Liver, spleen, adrenal glands, and kidneys are unremarkable in appearance on this noncontrast study.  No soft tissue masses or lymphadenopathy identified.  Sigmoid diverticulosis is again demonstrated, however there is no evidence of diverticulitis.  Foley catheter is now seen within the bladder which is collapsed.  Increased colonic ileus pattern is demonstrated.  IMPRESSION:  1. No significant change in appearance of the acute pancreatitis involving the pancreatic head and neck with probable 3 cm pseudocyst along the posterior wall of gastric antrum. 2.  Significant increase in moderate ascites and diffuse body wall edema since prior study.  New bilateral pleural effusions and bibasilar atelectasis or infiltrates also noted. 3.  Worsening colonic ileus pattern. 4.  Moderate hiatal hernia.   Original Report Authenticated By: Danae Orleans, M.D.    Dg Chest 2 View  12/30/2011  *RADIOLOGY REPORT*  Clinical Data: Low grade temperature and wheezes.  CHEST - 2 VIEW  Comparison: 03/24/2011  Findings: Two views of the chest demonstrate bibasilar densities suggestive for pleural fluid.  Prominent interstitial markings may represent mild edema.  There are increased densities in the right lung and cannot exclude airspace disease.  Heart size appears to be upper limits of normal.  Negative for a pneumothorax.  IMPRESSION: There is evidence for bilateral pleural  effusions and cannot exclude mild interstitial edema.  Patchy densities in the right lung may represent asymmetric edema versus infection.   Original Report Authenticated By: Richarda Overlie, M.D.    Dg Abd 1 View  01/06/2012  *RADIOLOGY REPORT*  Clinical Data: Evaluate pending tube placement  ABDOMEN - 1 VIEW  Comparison: Portable abdomen 01/05/2012  Findings: The feeding tube tip is in the distal body - proximal antrum of the stomach.  The bowel gas pattern is nonspecific. Contrast is noted scattered throughout the nondistended colon.  IMPRESSION: Feeding tube tip in distal body - proximal antrum of the stomach.   Original Report Authenticated By: Juline Patch, M.D.    Dg Abd 1 View  01/05/2012  *RADIOLOGY REPORT*  Clinical Data: Evaluate feeding tube position.  ABDOMEN - 1 VIEW  Comparison: 01/05/2012 fluoroscopic image  Findings: KUB following administration of Omnipaque contrast  via the patient's feeding tube demonstrates the tube tip to now be positioned within the mid to distal stomach.  However, the administered contrast is noted to reflux proximally rather than continue into the duodenum.  There is residual contrast within the colon from a prior examination.  Mild bibasilar opacities/pleural effusions are noted.  Surgical clips right upper quadrant.  IMPRESSION: Feeding tube tip has migrated distally, now located within the mid/distal stomach.  However, administered contrast is noted to reflux into the more proximal stomach rather than continue into the duodenum. Therefore, I would not recommend tube feeds unless the tube tip has passed the gastroduodenal junction. Recommend repeat radiograph in the a.m. to evaluate for tube tip position at that time.   Original Report Authenticated By: Waneta Martins, M.D.    Ct Guided Abscess Drain  01/19/2012  *RADIOLOGY REPORT*  CT GUIDED ABSCESS DRAIN PLACEMENT  Date: 01/19/2012  Clinical History: History of necrotic pancreatitis 10 days status post exploratory  laparotomy, pancreatic necrosectomy, and placement of to abdominal drains.  She now has a large peripherally enhancing loculated peritoneal fluid collection concerning for abscess. Percutaneous drain is requested.  Procedures Performed: 1. CT guided drain placement  Interventional Radiologist:  Sterling Big, MD  Sedation: Moderate (conscious) sedation was used.  4 mg Versed, 200 mcg Fentanyl were administered intravenously.  The patient's vital signs were monitored continuously by radiology nursing throughout the procedure.  Sedation Time: 25 minutes  PROCEDURE/FINDINGS:   Informed consent was obtained from the patient following explanation of the procedure, risks, benefits and alternatives. The patient understands, agrees and consents for the procedure. All questions were addressed. A time out was performed.  Maximal barrier sterile technique utilized including caps, mask, sterile gowns, sterile gloves, large sterile drape, hand hygiene, and betadine skin prep.  A planning axial CT scan was performed.  The large right hemi abdominal peritoneal fluid collection was identified.  An appropriate skin entry site was selected and marked.  Local anesthesia was achieved with infiltration of 1% lidocaine.  Using CT guidance, the fluid collection was punctured with an 18 gauge trocar needle.  A guide wire was advanced caudally within the fluid collection down into the anatomic pelvis.  The tract was then serially dilated to 12 [place] and a 12-French Cook all-purpose drainage catheter was advanced over the wire and positioned within the anatomic pelvis.  The 12-French Cook drainage catheter was modified with multiple additional side holes covering approximately 20 cm of its length to facilitate complete drainage of the fluid collection and prevent loculation into pockets. Approximately 300 ml of purulent fluid was initially aspirated. The tube was connected to gravity drainage and secured to the skin with both Prolene  suture and a plastic bumper.  The patient tolerated the procedure well, there was no immediate complication. She was returned to her room in stable condition.  A sample material was sent for culture.  IMPRESSION:  Successful placement of a 34 French Cook all-purpose drainage catheter modified with additional side holes into the right abdominal peritoneal abscess.  Approximately 300 ml of purulent material was aspirated.  A sample was sent for culture.  Signed,  Sterling Big, MD Vascular & Interventional Radiologist [facility]   Original Report Authenticated By: Threasa Beards Abdomen Pelvis W Contrast  01/18/2012  *RADIOLOGY REPORT*  Clinical Data: Acute pancreatitis.  Epigastric pain with fever. Pancreatic pseudocyst.  CT ABDOMEN AND PELVIS WITH CONTRAST  Technique:  Multidetector CT imaging of the abdomen and  pelvis was performed following the standard protocol during bolus administration of intravenous contrast.  Contrast: OMNIPAQUE IOHEXOL 300 MG/ML  SOLN  Comparison: CT scan dated 01/02/2012  Findings: The patient has significantly increased moderate bilateral pleural effusions with secondary atelectasis at the lung bases.  Heart size is normal. No change in the large hiatal hernia.  Two pseudocyst drainage catheters are present in the upper abdomen. There has been slight decrease in the fluid along the left pericolic gutter.  The patient now has increased enhancement of the margins of the loculated fluid in the abdomen and pelvis.  There is a fluid collection which extends from the overlying the top of the right lobe of the liver down the right pericolic gutter into the pelvis.  This collection measures 35 x 17 x 5.3 cm.  The drainage catheter in the left upper quadrant is within the left upper quadrant recess at this fluid collection that I suspect that the fluid with draining pattern with a midline pelvic catheter.  There is a small amount of residual pancreatic fluid around the mid  abdominal catheter.  This has decreased.  The liver, pancreas, adrenal glands, and kidneys are normal and stable.  Small pancreatic pseudocyst in the head of the pancreas is better defined measures 16 mm.  No dilated bowel.  Uterus and ovaries are normal.  No acute osseous abnormality.  IMPRESSION:  1.  The patient has developed an enhancing rim around the extensive loculated fluid in both sides of the abdomen consistent with a pseudocyst.  The most prominent portion of the collection is in the right side of the abdomen and the pelvis.  The draining in the left upper quadrant is within the recess at this collection but I suspect another drain in the pelvis might be useful. 2.  Slightly better definition of the small pseudocyst in the head of the pancreas. 3.  Interval development of moderate bilateral pleural effusions.   Original Report Authenticated By: Gwynn Burly, M.D.    Ct Abdomen Pelvis W Contrast  01/08/2012  *RADIOLOGY REPORT*  Clinical Data: Follow-up pancreatitis and abdominal and pelvic fluid collections.  CT ABDOMEN AND PELVIS WITH CONTRAST  Technique:  Multidetector CT imaging of the abdomen and pelvis was performed following the standard protocol during bolus administration of intravenous contrast.  Contrast: OMNIPAQUE IOHEXOL 300 MG/ML  SOLN  Comparison: 09/14/2013and 12/27/2011  Findings: Mild increase in size of small bilateral pleural effusions is seen.  Intraperitoneal drainage catheters are now seen in place, and decreased ascites is seen within the abdomen pelvis since prior exam.  Foley catheter again seen within the bladder which is collapsed.  A small pseudocyst is again seen in the pancreatic head.  This shows mild enlargement compared to recent study 6 days ago, currently measuring 2.3 x 2.4 cm, but is significantly decreased in size compared to earlier exam on 12/27/2011. No other loculated peri pancreatic fluid collections are identified. There is no evidence of distal  pancreatic atrophy or pancreatic ductal dilatation.  Prior cholecystectomy noted.  No evidence of biliary ductal dilatation.  Hepatic steatosis is again demonstrated, without evidence of focal liver mass.  The spleen, adrenal glands, and kidneys are normal in appearance.  No evidence of lymphadenopathy.  Uterus and adnexa are unremarkable.  Sigmoid diverticulosis is noted, however there is no evidence of diverticulitis.  No evidence of bowel obstruction. Diffuse body wall edema again noted. Moderate hiatal hernia again seen.  IMPRESSION:  1.  Decrease in moderate abdominal  and pelvic ascites with intraperitoneal drainage catheters now seen in place.  Mild increase in small bilateral pleural effusions. No significant change in diffuse body wall edema. 2.  Persistent small pseudocyst in the area the pancreatic head, which is decreased in size since earlier exam on 12/27/2011. Continued follow-up by CT is recommended. 3.  Ancillary findings again noted including moderate hiatal hernia, hepatic steatosis, and sigmoid diverticulosis.   Original Report Authenticated By: Danae Orleans, M.D.    Ct Abdomen Pelvis W Contrast  01/02/2012  **ADDENDUM** CREATED: 01/02/2012 17:36:05  I spoke with the critical care physician by telephone, and in further review of the CT images, the gas bubbles which I initially thought were in the pelvic ascites I believe are actually within a completely collapsed urinary bladder which has been displaced anteriorly by the ascites in the pelvis.  Therefore, I do not believe this represents free intraperitoneal air.  **END ADDENDUM** SIGNED BY: Arnell Sieving, M.D.   01/02/2012  *RADIOLOGY REPORT*  Clinical Data: Follow up pancreatitis.  Epigastric abdominal pain. Leukocytosis with white blood cell count 38,800.  CT ABDOMEN AND PELVIS WITH CONTRAST  Technique:  Multidetector CT imaging of the abdomen and pelvis was performed following the standard protocol during bolus administration of  intravenous contrast.  Contrast: OMNIPAQUE IOHEXOL 300 MG/ML. Oral contrast was also administered.  Comparison: CT abdomen and pelvis 12/30/2011, 12/27/2011, 09/27/2011 03/18/2011, 11/27/2010.  Findings: Interval increase in the amount of abdominal and pelvic ascites since the examination 3 days ago, now quite large.  Gas bubbles within the ascitic fluid in the pelvis.  Enhancement of the peritoneum and omentum, with mild thickening.  The fluid has Hounsfield measurement of near 0, indicating simple fluid.  Persistent edema/inflammation surrounding the head of the pancreas, with an approximate 1.8 x 2.6 cm pseudocyst at the junction of the head and body; this is unchanged from the examination 3 days ago and has increased slightly since the June, 2013 examination. Developing pseudocyst in the pancreatic head measuring approximately 1.3 x 2.6 cm, not present on the June, 2013 examination.  Gallbladder surgically absent.  No unexpected biliary ductal dilation.  Mild diffuse hepatic steatosis without focal hepatic parenchymal abnormality.  Normal appearing spleen, adrenal glands, and kidneys.  Moderate to severe aorto-iliac atherosclerosis without aneurysm.  Large hiatal hernia.  Nasogastric tube looped within the intrathoracic portion of the stomach.  Oral contrast material in the distal esophagus.  Upper normal colon or small bowel loops without obstruction.  Gaseous distention of the transverse colon. Sigmoid colon diverticulosis without evidence of acute diverticulitis.  Uterus and ovaries unremarkable by CT.  Phleboliths in both sides of the low pelvis.  Bone window images demonstrate degenerative changes involving the lower thoracic and lumbar spine. Consolidation in the deep posterior lower lobes bilaterally with air bronchograms.  Small left pleural effusion.  Diffuse body wall edema.  Heart size upper normal.  IMPRESSION:  1. Since the CT 3 days ago, interval increase in the ascites diffusely throughout  the abdomen and pelvis.  Gas bubbles are present within the ascitic fluid in the pelvis, presumably from recent tap.  2. Enhancement and mild thickening of the peritoneum and omentum, consistent with inflammation.  The ascitic fluid has a Hounsfield measurement near 0, indicating simple fluid (not hemorrhagic). 3.  Probable acute pancreatitis involving the head of the pancreas. Stable pseudocyst at the junction of the head and body.  Developing pseudocyst in the head of the pancreas. 4.  Diffuse hepatic steatosis without  focal hepatic parenchymal abnormality. 5.  Transverse colon ileus. 6.  Sigmoid colon diverticulosis without evidence of acute diverticulitis. 7.  Large hiatal hernia.  The nasogastric tube is looped within the intrathoracic stomach.  Oral contrast material in the distal esophagus is consistent with reflux. 8.  Bilateral lower lobe atelectasis and/or pneumonia.  Small left pleural effusion. 9.  Age advanced aorto-iliac atherosclerosis. 10.  Anasarca.   Original Report Authenticated By: Arnell Sieving, M.D.    Ct Abdomen Pelvis W Contrast  12/27/2011  *RADIOLOGY REPORT*  Clinical Data: Pain.  Pancreatitis.  White cell count 15.8.  Lipase 177.  Field  CT ABDOMEN AND PELVIS WITH CONTRAST  Technique:  Multidetector CT imaging of the abdomen and pelvis was performed following the standard protocol during bolus administration of intravenous contrast.  Contrast: OMNIPAQUE IOHEXOL 300 MG/ML  SOLN  Comparison: 09/27/2011.  Findings: Atelectasis or infiltration in both lung bases.  Large esophageal hiatal hernia.  There is extensive inflammatory infiltration and edema in the upper abdominal fat surrounding the pancreas and extending into the mesenteric and omental regions.  Changes are consistent with progressing acute pancreatitis.  Peripancreatic fluid collections are again demonstrated consistent with pseudocyst, similar to previous study.  Mild pancreatic ductal dilatation.  There appears to be  homogeneous enhancement of the pancreatic parenchyma without evidence of pancreatic necrosis.  No focal abscess.  Mild intra and extrahepatic bile duct dilatation. Portal and mesenteric vessels appear patent.  Surgical absence of the gallbladder.  The liver, spleen, adrenal glands, kidneys, and retroperitoneal lymph nodes are unremarkable.  Calcification of the abdominal aorta without aneurysm.  The stomach, small bowel, and colon are not distended.  No free air in the abdomen.  Pelvis:  Small amount of free fluid in the pelvis, likely related to inflammatory process.  No loculated fluid collections.  Bladder is decompressed.  The uterus and adnexal structures are not enlarged.  The appendix is normal.  No diverticulitis.  No significant lymphadenopathy in the pelvis.  IMPRESSION: Significant increase in upper abdominal inflammatory changes and edema consistent with progression of acute pancreatitis.  Fluid collections consistent with pseudocyst appears stable.   Original Report Authenticated By: Marlon Pel, M.D.    US Paracentesis  01/03/2012  *RADIOLOGY REPORT*  Clinical Data: Sepsis, pancreatitis, possible additional intra- abdominal process, ascites  ULTRASOUND GUIDED PARACENTESIS  Comparison:  None  An ultrasound guided paracentesis was thoroughly discussed with the patient and questions answered.  The benefits, risks, alternatives and complications were also discussed.  The patient understands and wishes to proceed with the procedure.  Written consent was obtained.  Ultrasound was performed to localize and mark an adequate pocket of fluid in the left lower quadrant of the abdomen. The fluid did appear to have some debris evident.  The area was then prepped and draped in the normal sterile fashion.  1% Lidocaine was used for local anesthesia.  Under ultrasound guidance a 19 gauge Yueh catheter was introduced.  Paracentesis was performed.  The catheter was removed and a dressing applied.   Complications:  None  Findings:  A total of approximately 1.5 liters of turbid brown- green fluid, with free floating debris was removed.  A fluid sample was sent for laboratory analysis.  IMPRESSION: Successful ultrasound guided paracentesis yielding 1.5 liters of ascites. However the fluid is concerning for acute intra-abdominal process. Findings discussed with attending/ordering physician.  Read BY Brayton El PA-C   Original Report Authenticated By: Donavan Burnet, M.D.    Dg  Chest Port 1 View  01/13/2012  *RADIOLOGY REPORT*  Clinical Data: Atelectasis.  PORTABLE CHEST - 1 VIEW  Comparison: 01/12/2012.  Findings: Right IJ central line appears unchanged.  Low lung volumes are present with greater than left basilar atelectasis. Drain projects over the left chest and left upper quadrant. Cardiomediastinal contours are unchanged.  IMPRESSION: No interval change.   Original Report Authenticated By: Andreas Newport, M.D.    Dg Chest Port 1 View  01/12/2012  *RADIOLOGY REPORT*  Clinical Data: Extubation.  PORTABLE CHEST - 1 VIEW  Comparison: 01/11/2012.  Findings: Endotracheal tube removed.  Right IJ central line persists.  Decreased lung volumes are present, expected after extubation.  Persistent bilateral effusions and basilar atelectasis. Cardiomediastinal contours appear unchanged.  IMPRESSION: Interval extubation with expected lower lung volumes.  Right IJ line persists.   Original Report Authenticated By: Andreas Newport, M.D.    Dg Chest Port 1 View  01/11/2012  *RADIOLOGY REPORT*  Clinical Data: Endotracheal tube placement.  PORTABLE CHEST - 1 VIEW  Comparison: 01/09/2012.  Findings: Endotracheal tube tip is 30 mm from the carina.  Lung volumes are lower than on prior exam.  Retrocardiac density is present, retrocardiac density is present likely representing atelectasis. There may be a small left pleural effusion.  Discoid atelectasis over the right hemidiaphragm.  Right IJ central line is unchanged.   IMPRESSION:  1.  Stable support apparatus. 2.  Increasing bibasilar atelectasis.  Probable small left pleural effusion.   Original Report Authenticated By: Andreas Newport, M.D.    Dg Chest Port 1 View  01/09/2012  *RADIOLOGY REPORT*  Clinical Data: Pneumonia.  Pleural effusions.  PORTABLE CHEST - 1 VIEW  Comparison: 01/08/2012  Findings: Endotracheal tube tip 3.7 cm above the carina.  Right IJ line tip projects over the SVC.  Low lung volumes are present, causing crowding of the pulmonary vasculature.  The patient is rotated to the right on today's exam, resulting in reduced diagnostic sensitivity and specificity. Stable mild atelectasis noted along the right hemidiaphragm. Minimally improved aeration at the left lung base, although retrocardiac airspace opacity persists.  IMPRESSION:  1.  Stable appearance of bibasilar airspace opacity and low lung volumes.  Support apparatus unchanged.   Original Report Authenticated By: Dellia Cloud, M.D.    Dg Chest Port 1 View  01/08/2012  *RADIOLOGY REPORT*  Clinical Data: Pleural effusion.  PORTABLE CHEST - 1 VIEW  Comparison: Chest 01/06/2012 and 01/07/2012.  Findings: Support tubes and lines are unchanged. Left pleural effusion and left worse than right basilar airspace disease appear unchanged.  No pneumothorax identified.  IMPRESSION: No interval change.   Original Report Authenticated By: Bernadene Bell. Maricela Curet, M.D.    Dg Chest Port 1 View  01/07/2012  *RADIOLOGY REPORT*  Clinical Data: Endotracheal tube placement.  PORTABLE CHEST - 1 VIEW  Comparison: Chest 01/06/2012.  Findings: Endotracheal tube is in place with tip in good position at the level of the clavicular heads.  Feeding tube is no longer visualized.  Right IJ catheter remains in place.  Left basilar airspace opacity is unchanged.  Right lung appears clear.  No pneumothorax.  IMPRESSION:  1.  ET tube in good position. 2.  No change in left basilar airspace opacity.   Original Report  Authenticated By: Bernadene Bell. Maricela Curet, M.D.    Dg Chest Port 1 View  01/06/2012  *RADIOLOGY REPORT*  Clinical Data: Pleural effusion.  PORTABLE CHEST - 1 VIEW  Comparison: 01/05/2012.  Findings: Patient is rotated to the  left.  The endotracheal tube appears similar to the prior exam.  The nasogastric tube has been replaced with a feeding tube and the tip is not visualized.  There is redundant tubing over the thoracic inlet and there may be a coiled loop of the feeding tube in the proximal esophagus or hypopharynx.  Consider retracting and re-advancing the feeding tube for better positioning and to see if the loop produces.  Bibasilar atelectasis remains present. Right IJ central line remains present.  IMPRESSION: 1.  Endotracheal tube and right IJ central line are unchanged. 2.  Exchange of nasogastric tube for feeding tube.  There appears to be a redundant loop in the hypopharynx and upper esophagus however this could be external to the patient as well.  The inspection is recommended. 3.  Persistent bilateral basilar atelectasis and probable left pleural effusion.  This is a call report.   Original Report Authenticated By: Andreas Newport, M.D.    Dg Chest Port 1 View  01/05/2012  *RADIOLOGY REPORT*  Clinical Data: Atelectasis.  Effusions.  PORTABLE CHEST - 1 VIEW  Comparison: Chest 01/03/2012 and 01/04/2012.  Findings: Support tubes and lines are unchanged.  Lung volumes are lower than on the most recent study with increased basilar atelectasis, worse on the left.  No pneumothorax is identified. Heart size is normal.  IMPRESSION: Increased bibasilar atelectasis.  No other change.   Original Report Authenticated By: Bernadene Bell. Maricela Curet, M.D.    Dg Chest Port 1 View  01/04/2012  *RADIOLOGY REPORT*  Clinical Data: Increased heart rate.  Decreased oxygen saturation.  PORTABLE CHEST - 1 VIEW  Comparison: Plain film chest 01/03/2012.  Findings: Support tubes and lines are unchanged.  No pneumothorax is  identified.  Discoid atelectasis in the lung bases is more notable on the right.  The lungs are otherwise clear.  IMPRESSION:  1.  Support apparatus in good position. 2.  Bibasilar atelectasis.   Original Report Authenticated By: Bernadene Bell. Maricela Curet, M.D.    Dg Chest Port 1 View  01/03/2012  *RADIOLOGY REPORT*  Clinical Data: Acute respiratory distress syndrome, endotracheal tube placement.  PORTABLE CHEST - 1 VIEW  Comparison: January 02, 2012.  Findings: Cardiomediastinal silhouette appears normal. Endotracheal tube is in grossly good position with tip several centimeters above the carina.  Nasogastric tube tip is coiled within proximal stomach.  Right internal jugular catheter line is unchanged in position. Left basilar opacity is unchanged compared to prior exam.  No change is noted in mild bilateral pleural effusions.  IMPRESSION: No change compared to prior exam.   Original Report Authenticated By: Venita Sheffield., M.D.    Dg Chest Port 1 View  01/02/2012  *RADIOLOGY REPORT*  Clinical Data: Endotracheal tube  PORTABLE CHEST - 1 VIEW  Comparison: Yesterday  Findings: Endotracheal tube tip 30.0 cm from the carina.  Lungs markedly under aerated with increasing bibasilar atelectasis and/or airspace disease.  Pleural effusions suspected.  Right internal jugular vein center venous catheter stable.  No pneumothorax.  IMPRESSION: Increasing bibasilar atelectasis verses airspace disease. Otherwise, no significant change.   Original Report Authenticated By: Donavan Burnet, M.D.    Dg Chest Port 1 View  01/01/2012  *RADIOLOGY REPORT*  Clinical Data: Evaluate endotracheal tube placement.  PORTABLE CHEST - 1 VIEW  Comparison: Chest x-ray 12/31/2011  Findings: An endotracheal tube is in place with tip 4.2 cm above the carina. There is a right-sided internal jugular central venous catheter with tip terminating in the superior cavoatrial junction. A  nasogastric tube is seen extending into the stomach, however,  the tip of the nasogastric tube extends below the lower margin of the image.  Lung volumes remain low, and there are bibasilar opacities, favored to represent atelectasis (underlying airspace consolidation is difficult to exclude, but is not favored.  Small bilateral pleural effusions are unchanged.  Pulmonary vascular congestion, without frank pulmonary edema.  Heart size appears upper limits of normal.  Mediastinal contours are unremarkable.  Atherosclerosis in the thoracic aorta.  IMPRESSION: 1.  Support apparatus, as above. 2.  Low lung volumes with persistent bibasilar atelectasis and small bilateral pleural effusions. 3.  Atherosclerosis.   Original Report Authenticated By: Florencia Reasons, M.D.    Dg Chest Port 1 View  12/31/2011  *RADIOLOGY REPORT*  Clinical Data: 56 year old female endotracheal tube.  Line placement.  Acute respiratory failure.  PORTABLE CHEST - 1 VIEW  Comparison: 12/31/2011 and earlier.  Findings: Semi upright AP portable view 1429 hours.  The patient is known debated.  Endotracheal tube tip is in good position between the level of clavicles and carina.  Enteric tube courses to the abdomen, partially looped within the known hiatal hernia but terminates below the level of the diaphragm (probably distal stomach).  Right IJ central line placed, distal tip felt to be at the T6-T7 level corresponding to the SVC.  Linear opacity below the level is felt related the spinous processes.  Bilateral pleural effusions.  Bibasilar collapse or consolidation. No pneumothorax.  Stable increased interstitial markings which may relate to vascular congestion.  Stable cardiac size and mediastinal contours.  IMPRESSION: 1.  Endotracheal tube and enteric tube appear in good position. 2.  Right IJ central line tip felt to terminate at the SVC level, linear opacity below the level felt related to the spinous processes. 3.  Bilateral pleural effusions and lower lobe collapse consolidation with superimposed  vascular congestion.   Original Report Authenticated By: Harley Hallmark, M.D.    Dg Chest Port 1 View  12/31/2011  *RADIOLOGY REPORT*  Clinical Data: Shortness of breath.  PORTABLE CHEST - 1 VIEW  Comparison: 12/30/2011  Findings: Shallow inspiration.  Cardiac enlargement with pulmonary vascular congestion and developing perihilar edema more prominent since previous study.  Small bilateral pleural effusions with basilar atelectasis or infiltration.  No pneumothorax.  IMPRESSION: Increasing pulmonary vascular congestion and edema with small bilateral pleural effusions and basilar atelectasis filtration.   Original Report Authenticated By: Marlon Pel, M.D.    Dg Abd Acute W/chest  12/26/2011  *RADIOLOGY REPORT*  Clinical Data: Epigastric pain and nausea.  ACUTE ABDOMEN SERIES (ABDOMEN 2 VIEW & CHEST 1 VIEW)  Comparison: CT abdomen and pelvis 09/27/2011.  Findings: Shallow inspiration with atelectasis or infiltration in the lung bases.  Normal heart size and pulmonary vascularity.  No blunting of costophrenic angles.  No pneumothorax.  Mediastinal contours are intact.  Calcification of the aorta.  Esophageal hiatal hernia behind the heart.  Scattered gas and stool in the colon.  No small or large bowel distension.  No abnormal air fluid levels.  No free air in the abdomen.  Surgical clips in the right upper quadrant.  Degenerative changes in the spine and hips.  IMPRESSION: Shallow inspiration with infiltration or atelectasis in the lung bases.  Esophageal hiatal hernia.  Nonobstructing bowel gas pattern.   Original Report Authenticated By: Marlon Pel, M.D.    Dg Intro Long Gi Tube  01/05/2012  *RADIOLOGY REPORT*  Clinical Data: Feeding tube placement.  DG  INTRO LONG GI TUBE  Technique:  Feeding tube placement under fluoroscopy guidance  Fluoroscopy time: 21 seconds  Comparison:  01/02/2012 CT  Findings:  Under real time observation, feeding tube advanced distal to the hiatal hernia, into the  intra-abdominal stomach. Unable to advance into the duodenum.  IMPRESSION: Feeding tube projects over the mid stomach. This is a suboptimal position for tube feeds, however recommend a follow-up radiograph to evaluate for distal migration of the tube via peristalsis.   Original Report Authenticated By: Waneta Martins, M.D.     Medications: I have reviewed the patient's current medications. Scheduled Meds:    . acetaminophen  1,000 mg Oral TID  . acidophilus  1 capsule Oral Daily  . antiseptic oral rinse  15 mL Mouth Rinse QID  . chlorhexidine  15 mL Mouth Rinse BID  . enoxaparin (LOVENOX) injection  40 mg Subcutaneous Q24H  . folic acid  1 mg Intravenous Daily  . furosemide  20 mg Oral Daily  . insulin aspart  2-6 Units Subcutaneous Q6H  . lip balm  1 application Topical BID  . lip balm      . LORazepam  1 mg Intravenous Q8H  . octreotide  100 mcg Subcutaneous Q8H  . pantoprazole (PROTONIX) IV  40 mg Intravenous Q12H  . piperacillin-tazobactam (ZOSYN)  IV  3.375 g Intravenous Q8H  . DISCONTD: metronidazole  500 mg Intravenous Q8H  . DISCONTD: piperacillin-tazobactam (ZOSYN)  IV  3.375 g Intravenous Q8H   Continuous Infusions:    . sodium chloride 20 mL/hr (01/13/12 0042)  . TPN (CLINIMIX) +/- additives 80 mL/hr at 01/22/12 1723   And  . fat emulsion 240 mL (01/22/12 1723)  . TPN (CLINIMIX) +/- additives 80 mL/hr at 01/23/12 1712   PRN Meds:.acetaminophen, diphenhydrAMINE, HYDROmorphone (DILAUDID) injection, magic mouthwash, menthol-cetylpyridinium, ondansetron (ZOFRAN) IV, oxyCODONE, polyvinyl alcohol, sodium chloride, DISCONTD:  HYDROmorphone (DILAUDID) injection, DISCONTD: menthol-cetylpyridinium, DISCONTD: oxyCODONE, DISCONTD: oxyCODONE-acetaminophen Assessment/Plan: Patient Active Hospital Problem List:  Ruptured pancreatic pseudocyst with peritonitis:  - patient has had a very complicated course. Currently she is on day #16 no Flagyl day #20 of Zosyn. The duration of  antibiotic treatment will be determined by Gen. surgery and is unspecified at this time. -Patient had a new area of collection in the abdomen and had a JP drain placed by interventional radiology yesterday morning which has had about 30 cc of fluid out. -Nutrition management per surgery   Anemia: -s/p transfusion of one unit packed blood cells on 9/28 -Hemoglobin drifted down to 7.2 today. Patient currently receiving blood transfusion per surgery.  -We'll continue to monitor     Gastric ulcer: -Patient status post EGD which showed gastric ulcer.  -Continue twice a day PPI treatment   C. difficile colitis: -As noted above patient on day #16 of Flagyl which is a completed course of treatment for C. Difficile -Repeat C. difficile from 9/29 still pending as patient has had no further stool.    the majority this patient's care is under that surgery. I will ask the surgeon is to assume care of this patient as we're adding nothing to the patient's therapeutic plan  and management   LOS: 28 days

## 2012-01-24 LAB — ANAEROBIC CULTURE

## 2012-01-24 LAB — GLUCOSE, CAPILLARY
Glucose-Capillary: 101 mg/dL — ABNORMAL HIGH (ref 70–99)
Glucose-Capillary: 136 mg/dL — ABNORMAL HIGH (ref 70–99)
Glucose-Capillary: 141 mg/dL — ABNORMAL HIGH (ref 70–99)

## 2012-01-24 MED ORDER — CLINIMIX E/DEXTROSE (5/15) 5 % IV SOLN
INTRAVENOUS | Status: AC
  Administered 2012-01-24: 17:00:00 via INTRAVENOUS
  Filled 2012-01-24: qty 2000

## 2012-01-24 NOTE — Progress Notes (Signed)
Subjective: Patient states that she feels well.  Interval history: I've spoken with Dr. gross at surgery who feels that at this time the patient still requires acute hospitalization given that the lacerating her for abscess was only 48 hours ago. He does not believe that at this time the patient is a candidate for LTAC.  Objective: Filed Vitals:   01/23/12 2218 01/24/12 0651 01/24/12 1430 01/24/12 1751  BP: 125/78 121/69 97/51 134/70  Pulse: 79 88 73   Temp: 98.1 F (36.7 C) 99.4 F (37.4 C)    TempSrc: Oral Oral    Resp: 18 18 18    Height:      Weight:      SpO2: 92% 92% 94%    Weight change:   Intake/Output Summary (Last 24 hours) at 01/24/12 1848 Last data filed at 01/24/12 1600  Gross per 24 hour  Intake   1200 ml  Output    300 ml  Net    900 ml    General: Alert, awake, oriented x3, in no acute distress.  HEENT: [place]/AT PEERL, EOMI Heart: Regular rate and rhythm.  Lungs: Clear to auscultation Abdomen: Mildly distended, wound VAC in place. Drains were tan colored output. Neuro: No focal neurological deficits noted. Musculoskeletal: No warm swelling or erythema around joints, no spinal tenderness noted. I've so the patient ambulating around the room and she seems to be doing much better    Lab Results:  Basename 01/22/12 0800  NA 132*  K 3.8  CL 98  CO2 26  GLUCOSE 175*  BUN 11  CREATININE 0.53  CALCIUM 8.2*  MG --  PHOS --   No results found for this basename: AST:2,ALT:2,ALKPHOS:2,BILITOT:2,PROT:2,ALBUMIN:2 in the last 72 hours No results found for this basename: LIPASE:2,AMYLASE:2 in the last 72 hours  Basename 01/22/12 0800  WBC 11.2*  NEUTROABS --  HGB 9.9*  HCT 30.1*  MCV 87.5  PLT 483*   No results found for this basename: CKTOTAL:3,CKMB:3,CKMBINDEX:3,TROPONINI:3 in the last 72 hours No components found with this basename: POCBNP:3 No results found for this basename: DDIMER:2 in the last 72 hours No results found for this basename: HGBA1C:2  in the last 72 hours No results found for this basename: CHOL:2,HDL:2,LDLCALC:2,TRIG:2,CHOLHDL:2,LDLDIRECT:2 in the last 72 hours No results found for this basename: TSH,T4TOTAL,FREET3,T3FREE,THYROIDAB in the last 72 hours No results found for this basename: VITAMINB12:2,FOLATE:2,FERRITIN:2,TIBC:2,IRON:2,RETICCTPCT:2 in the last 72 hours  Micro Results: Recent Results (from the past 240 hour(s))  ANAEROBIC CULTURE     Status: Normal   Collection Time   01/19/12  2:00 PM      Component Value Range Status Comment   Specimen Description PERITONEAL CAVITY   Final    Special Requests Normal   Final    Gram Stain     Final    Value: FEW WBC PRESENT,BOTH PMN AND MONONUCLEAR     NO ORGANISMS SEEN   Culture NO ANAEROBES ISOLATED   Final    Report Status 01/24/2012 FINAL   Final   BODY FLUID CULTURE     Status: Normal   Collection Time   01/19/12  2:00 PM      Component Value Range Status Comment   Specimen Description FLUID PERITONEAL CAVITY   Final    Special Requests NONE   Final    Gram Stain     Final    Value: FEW WBC PRESENT,BOTH PMN AND MONONUCLEAR     NO ORGANISMS SEEN   Culture NO GROWTH 3 DAYS  Final    Report Status 01/23/2012 FINAL   Final   CLOSTRIDIUM DIFFICILE BY PCR     Status: Normal   Collection Time   01/24/12 10:05 AM      Component Value Range Status Comment   C difficile by pcr NEGATIVE  NEGATIVE Final     Studies/Results: Ct Abdomen Pelvis Wo Contrast  12/30/2011  *RADIOLOGY REPORT*  Clinical Data: Abdominal pain.  Nausea and vomiting.  Elevated liver function tests.  Follow-up acute pancreatitis.  CT ABDOMEN AND PELVIS WITHOUT CONTRAST  Technique:  Multidetector CT imaging of the abdomen and pelvis was performed following the standard protocol without intravenous contrast.  Comparison: 12/27/2011  Findings: New small bilateral pleural effusions and bibasilar atelectasis or infiltrates are seen.  A moderate size hiatal hernia is again demonstrated.  Moderate ascites  within the abdomen and pelvis has significantly increased since previous study.  New diffuse body wall edema also noted.  A persistent small focal fluid collection is seen between the posterior wall the gastric antrum and the pancreatic neck which measures approximately 2 x 3 cm and is not significantly changed. This is suspicious for a small pancreatic pseudocyst.  Swelling of the pancreatic head and peripancreatic inflammatory changes remain consistent with acute pancreatitis, but show no significant change.  Surgical clips again seen from prior cholecystectomy.  No definite evidence of biliary dilatation. Liver, spleen, adrenal glands, and kidneys are unremarkable in appearance on this noncontrast study.  No soft tissue masses or lymphadenopathy identified.  Sigmoid diverticulosis is again demonstrated, however there is no evidence of diverticulitis.  Foley catheter is now seen within the bladder which is collapsed.  Increased colonic ileus pattern is demonstrated.  IMPRESSION:  1. No significant change in appearance of the acute pancreatitis involving the pancreatic head and neck with probable 3 cm pseudocyst along the posterior wall of gastric antrum. 2.  Significant increase in moderate ascites and diffuse body wall edema since prior study.  New bilateral pleural effusions and bibasilar atelectasis or infiltrates also noted. 3.  Worsening colonic ileus pattern. 4.  Moderate hiatal hernia.   Original Report Authenticated By: Danae Orleans, M.D.    Dg Chest 2 View  12/30/2011  *RADIOLOGY REPORT*  Clinical Data: Low grade temperature and wheezes.  CHEST - 2 VIEW  Comparison: 03/24/2011  Findings: Two views of the chest demonstrate bibasilar densities suggestive for pleural fluid.  Prominent interstitial markings may represent mild edema.  There are increased densities in the right lung and cannot exclude airspace disease.  Heart size appears to be upper limits of normal.  Negative for a pneumothorax.   IMPRESSION: There is evidence for bilateral pleural effusions and cannot exclude mild interstitial edema.  Patchy densities in the right lung may represent asymmetric edema versus infection.   Original Report Authenticated By: Richarda Overlie, M.D.    Dg Abd 1 View  01/06/2012  *RADIOLOGY REPORT*  Clinical Data: Evaluate pending tube placement  ABDOMEN - 1 VIEW  Comparison: Portable abdomen 01/05/2012  Findings: The feeding tube tip is in the distal body - proximal antrum of the stomach.  The bowel gas pattern is nonspecific. Contrast is noted scattered throughout the nondistended colon.  IMPRESSION: Feeding tube tip in distal body - proximal antrum of the stomach.   Original Report Authenticated By: Juline Patch, M.D.    Dg Abd 1 View  01/05/2012  *RADIOLOGY REPORT*  Clinical Data: Evaluate feeding tube position.  ABDOMEN - 1 VIEW  Comparison: 01/05/2012 fluoroscopic image  Findings: KUB following administration of Omnipaque contrast via the patient's feeding tube demonstrates the tube tip to now be positioned within the mid to distal stomach.  However, the administered contrast is noted to reflux proximally rather than continue into the duodenum.  There is residual contrast within the colon from a prior examination.  Mild bibasilar opacities/pleural effusions are noted.  Surgical clips right upper quadrant.  IMPRESSION: Feeding tube tip has migrated distally, now located within the mid/distal stomach.  However, administered contrast is noted to reflux into the more proximal stomach rather than continue into the duodenum. Therefore, I would not recommend tube feeds unless the tube tip has passed the gastroduodenal junction. Recommend repeat radiograph in the a.m. to evaluate for tube tip position at that time.   Original Report Authenticated By: Waneta Martins, M.D.    Ct Guided Abscess Drain  01/19/2012  *RADIOLOGY REPORT*  CT GUIDED ABSCESS DRAIN PLACEMENT  Date: 01/19/2012  Clinical History: History of  necrotic pancreatitis 10 days status post exploratory laparotomy, pancreatic necrosectomy, and placement of to abdominal drains.  She now has a large peripherally enhancing loculated peritoneal fluid collection concerning for abscess. Percutaneous drain is requested.  Procedures Performed: 1. CT guided drain placement  Interventional Radiologist:  Sterling Big, MD  Sedation: Moderate (conscious) sedation was used.  4 mg Versed, 200 mcg Fentanyl were administered intravenously.  The patient's vital signs were monitored continuously by radiology nursing throughout the procedure.  Sedation Time: 25 minutes  PROCEDURE/FINDINGS:   Informed consent was obtained from the patient following explanation of the procedure, risks, benefits and alternatives. The patient understands, agrees and consents for the procedure. All questions were addressed. A time out was performed.  Maximal barrier sterile technique utilized including caps, mask, sterile gowns, sterile gloves, large sterile drape, hand hygiene, and betadine skin prep.  A planning axial CT scan was performed.  The large right hemi abdominal peritoneal fluid collection was identified.  An appropriate skin entry site was selected and marked.  Local anesthesia was achieved with infiltration of 1% lidocaine.  Using CT guidance, the fluid collection was punctured with an 18 gauge trocar needle.  A guide wire was advanced caudally within the fluid collection down into the anatomic pelvis.  The tract was then serially dilated to 12 [place] and a 12-French Cook all-purpose drainage catheter was advanced over the wire and positioned within the anatomic pelvis.  The 12-French Cook drainage catheter was modified with multiple additional side holes covering approximately 20 cm of its length to facilitate complete drainage of the fluid collection and prevent loculation into pockets. Approximately 300 ml of purulent fluid was initially aspirated. The tube was connected to  gravity drainage and secured to the skin with both Prolene suture and a plastic bumper.  The patient tolerated the procedure well, there was no immediate complication. She was returned to her room in stable condition.  A sample material was sent for culture.  IMPRESSION:  Successful placement of a 70 French Cook all-purpose drainage catheter modified with additional side holes into the right abdominal peritoneal abscess.  Approximately 300 ml of purulent material was aspirated.  A sample was sent for culture.  Signed,  Sterling Big, MD Vascular & Interventional Radiologist [facility]   Original Report Authenticated By: Threasa Beards Abdomen Pelvis W Contrast  01/18/2012  *RADIOLOGY REPORT*  Clinical Data: Acute pancreatitis.  Epigastric pain with fever. Pancreatic pseudocyst.  CT ABDOMEN AND PELVIS WITH CONTRAST  Technique:  Multidetector CT imaging of the abdomen and pelvis was performed following the standard protocol during bolus administration of intravenous contrast.  Contrast: OMNIPAQUE IOHEXOL 300 MG/ML  SOLN  Comparison: CT scan dated 01/02/2012  Findings: The patient has significantly increased moderate bilateral pleural effusions with secondary atelectasis at the lung bases.  Heart size is normal. No change in the large hiatal hernia.  Two pseudocyst drainage catheters are present in the upper abdomen. There has been slight decrease in the fluid along the left pericolic gutter.  The patient now has increased enhancement of the margins of the loculated fluid in the abdomen and pelvis.  There is a fluid collection which extends from the overlying the top of the right lobe of the liver down the right pericolic gutter into the pelvis.  This collection measures 35 x 17 x 5.3 cm.  The drainage catheter in the left upper quadrant is within the left upper quadrant recess at this fluid collection that I suspect that the fluid with draining pattern with a midline pelvic catheter.  There is a  small amount of residual pancreatic fluid around the mid abdominal catheter.  This has decreased.  The liver, pancreas, adrenal glands, and kidneys are normal and stable.  Small pancreatic pseudocyst in the head of the pancreas is better defined measures 16 mm.  No dilated bowel.  Uterus and ovaries are normal.  No acute osseous abnormality.  IMPRESSION:  1.  The patient has developed an enhancing rim around the extensive loculated fluid in both sides of the abdomen consistent with a pseudocyst.  The most prominent portion of the collection is in the right side of the abdomen and the pelvis.  The draining in the left upper quadrant is within the recess at this collection but I suspect another drain in the pelvis might be useful. 2.  Slightly better definition of the small pseudocyst in the head of the pancreas. 3.  Interval development of moderate bilateral pleural effusions.   Original Report Authenticated By: Gwynn Burly, M.D.    Ct Abdomen Pelvis W Contrast  01/08/2012  *RADIOLOGY REPORT*  Clinical Data: Follow-up pancreatitis and abdominal and pelvic fluid collections.  CT ABDOMEN AND PELVIS WITH CONTRAST  Technique:  Multidetector CT imaging of the abdomen and pelvis was performed following the standard protocol during bolus administration of intravenous contrast.  Contrast: OMNIPAQUE IOHEXOL 300 MG/ML  SOLN  Comparison: 09/14/2013and 12/27/2011  Findings: Mild increase in size of small bilateral pleural effusions is seen.  Intraperitoneal drainage catheters are now seen in place, and decreased ascites is seen within the abdomen pelvis since prior exam.  Foley catheter again seen within the bladder which is collapsed.  A small pseudocyst is again seen in the pancreatic head.  This shows mild enlargement compared to recent study 6 days ago, currently measuring 2.3 x 2.4 cm, but is significantly decreased in size compared to earlier exam on 12/27/2011. No other loculated peri pancreatic fluid  collections are identified. There is no evidence of distal pancreatic atrophy or pancreatic ductal dilatation.  Prior cholecystectomy noted.  No evidence of biliary ductal dilatation.  Hepatic steatosis is again demonstrated, without evidence of focal liver mass.  The spleen, adrenal glands, and kidneys are normal in appearance.  No evidence of lymphadenopathy.  Uterus and adnexa are unremarkable.  Sigmoid diverticulosis is noted, however there is no evidence of diverticulitis.  No evidence of bowel obstruction. Diffuse body wall edema again noted. Moderate hiatal hernia again seen.  IMPRESSION:  1.  Decrease in moderate abdominal and pelvic ascites with intraperitoneal drainage catheters now seen in place.  Mild increase in small bilateral pleural effusions. No significant change in diffuse body wall edema. 2.  Persistent small pseudocyst in the area the pancreatic head, which is decreased in size since earlier exam on 12/27/2011. Continued follow-up by CT is recommended. 3.  Ancillary findings again noted including moderate hiatal hernia, hepatic steatosis, and sigmoid diverticulosis.   Original Report Authenticated By: Danae Orleans, M.D.    Ct Abdomen Pelvis W Contrast  01/02/2012  **ADDENDUM** CREATED: 01/02/2012 17:36:05  I spoke with the critical care physician by telephone, and in further review of the CT images, the gas bubbles which I initially thought were in the pelvic ascites I believe are actually within a completely collapsed urinary bladder which has been displaced anteriorly by the ascites in the pelvis.  Therefore, I do not believe this represents free intraperitoneal air.  **END ADDENDUM** SIGNED BY: Arnell Sieving, M.D.   01/02/2012  *RADIOLOGY REPORT*  Clinical Data: Follow up pancreatitis.  Epigastric abdominal pain. Leukocytosis with white blood cell count 38,800.  CT ABDOMEN AND PELVIS WITH CONTRAST  Technique:  Multidetector CT imaging of the abdomen and pelvis was performed following  the standard protocol during bolus administration of intravenous contrast.  Contrast: OMNIPAQUE IOHEXOL 300 MG/ML. Oral contrast was also administered.  Comparison: CT abdomen and pelvis 12/30/2011, 12/27/2011, 09/27/2011 03/18/2011, 11/27/2010.  Findings: Interval increase in the amount of abdominal and pelvic ascites since the examination 3 days ago, now quite large.  Gas bubbles within the ascitic fluid in the pelvis.  Enhancement of the peritoneum and omentum, with mild thickening.  The fluid has Hounsfield measurement of near 0, indicating simple fluid.  Persistent edema/inflammation surrounding the head of the pancreas, with an approximate 1.8 x 2.6 cm pseudocyst at the junction of the head and body; this is unchanged from the examination 3 days ago and has increased slightly since the June, 2013 examination. Developing pseudocyst in the pancreatic head measuring approximately 1.3 x 2.6 cm, not present on the June, 2013 examination.  Gallbladder surgically absent.  No unexpected biliary ductal dilation.  Mild diffuse hepatic steatosis without focal hepatic parenchymal abnormality.  Normal appearing spleen, adrenal glands, and kidneys.  Moderate to severe aorto-iliac atherosclerosis without aneurysm.  Large hiatal hernia.  Nasogastric tube looped within the intrathoracic portion of the stomach.  Oral contrast material in the distal esophagus.  Upper normal colon or small bowel loops without obstruction.  Gaseous distention of the transverse colon. Sigmoid colon diverticulosis without evidence of acute diverticulitis.  Uterus and ovaries unremarkable by CT.  Phleboliths in both sides of the low pelvis.  Bone window images demonstrate degenerative changes involving the lower thoracic and lumbar spine. Consolidation in the deep posterior lower lobes bilaterally with air bronchograms.  Small left pleural effusion.  Diffuse body wall edema.  Heart size upper normal.  IMPRESSION:  1. Since the CT 3 days ago,  interval increase in the ascites diffusely throughout the abdomen and pelvis.  Gas bubbles are present within the ascitic fluid in the pelvis, presumably from recent tap.  2. Enhancement and mild thickening of the peritoneum and omentum, consistent with inflammation.  The ascitic fluid has a Hounsfield measurement near 0, indicating simple fluid (not hemorrhagic). 3.  Probable acute pancreatitis involving the head of the pancreas. Stable pseudocyst at the junction of the head and body.  Developing pseudocyst in the head of the pancreas.  4.  Diffuse hepatic steatosis without focal hepatic parenchymal abnormality. 5.  Transverse colon ileus. 6.  Sigmoid colon diverticulosis without evidence of acute diverticulitis. 7.  Large hiatal hernia.  The nasogastric tube is looped within the intrathoracic stomach.  Oral contrast material in the distal esophagus is consistent with reflux. 8.  Bilateral lower lobe atelectasis and/or pneumonia.  Small left pleural effusion. 9.  Age advanced aorto-iliac atherosclerosis. 10.  Anasarca.   Original Report Authenticated By: Arnell Sieving, M.D.    Ct Abdomen Pelvis W Contrast  12/27/2011  *RADIOLOGY REPORT*  Clinical Data: Pain.  Pancreatitis.  White cell count 15.8.  Lipase 177.  Field  CT ABDOMEN AND PELVIS WITH CONTRAST  Technique:  Multidetector CT imaging of the abdomen and pelvis was performed following the standard protocol during bolus administration of intravenous contrast.  Contrast: OMNIPAQUE IOHEXOL 300 MG/ML  SOLN  Comparison: 09/27/2011.  Findings: Atelectasis or infiltration in both lung bases.  Large esophageal hiatal hernia.  There is extensive inflammatory infiltration and edema in the upper abdominal fat surrounding the pancreas and extending into the mesenteric and omental regions.  Changes are consistent with progressing acute pancreatitis.  Peripancreatic fluid collections are again demonstrated consistent with pseudocyst, similar to previous study.   Mild pancreatic ductal dilatation.  There appears to be homogeneous enhancement of the pancreatic parenchyma without evidence of pancreatic necrosis.  No focal abscess.  Mild intra and extrahepatic bile duct dilatation. Portal and mesenteric vessels appear patent.  Surgical absence of the gallbladder.  The liver, spleen, adrenal glands, kidneys, and retroperitoneal lymph nodes are unremarkable.  Calcification of the abdominal aorta without aneurysm.  The stomach, small bowel, and colon are not distended.  No free air in the abdomen.  Pelvis:  Small amount of free fluid in the pelvis, likely related to inflammatory process.  No loculated fluid collections.  Bladder is decompressed.  The uterus and adnexal structures are not enlarged.  The appendix is normal.  No diverticulitis.  No significant lymphadenopathy in the pelvis.  IMPRESSION: Significant increase in upper abdominal inflammatory changes and edema consistent with progression of acute pancreatitis.  Fluid collections consistent with pseudocyst appears stable.   Original Report Authenticated By: Marlon Pel, M.D.    US Paracentesis  01/03/2012  *RADIOLOGY REPORT*  Clinical Data: Sepsis, pancreatitis, possible additional intra- abdominal process, ascites  ULTRASOUND GUIDED PARACENTESIS  Comparison:  None  An ultrasound guided paracentesis was thoroughly discussed with the patient and questions answered.  The benefits, risks, alternatives and complications were also discussed.  The patient understands and wishes to proceed with the procedure.  Written consent was obtained.  Ultrasound was performed to localize and mark an adequate pocket of fluid in the left lower quadrant of the abdomen. The fluid did appear to have some debris evident.  The area was then prepped and draped in the normal sterile fashion.  1% Lidocaine was used for local anesthesia.  Under ultrasound guidance a 19 gauge Yueh catheter was introduced.  Paracentesis was performed.  The  catheter was removed and a dressing applied.  Complications:  None  Findings:  A total of approximately 1.5 liters of turbid brown- green fluid, with free floating debris was removed.  A fluid sample was sent for laboratory analysis.  IMPRESSION: Successful ultrasound guided paracentesis yielding 1.5 liters of ascites. However the fluid is concerning for acute intra-abdominal process. Findings discussed with attending/ordering physician.  Read BY Brayton El PA-C   Original Report Authenticated By: Marlowe Aschoff  Bonnielee Haff, M.D.    Dg Chest Port 1 View  01/13/2012  *RADIOLOGY REPORT*  Clinical Data: Atelectasis.  PORTABLE CHEST - 1 VIEW  Comparison: 01/12/2012.  Findings: Right IJ central line appears unchanged.  Low lung volumes are present with greater than left basilar atelectasis. Drain projects over the left chest and left upper quadrant. Cardiomediastinal contours are unchanged.  IMPRESSION: No interval change.   Original Report Authenticated By: Andreas Newport, M.D.    Dg Chest Port 1 View  01/12/2012  *RADIOLOGY REPORT*  Clinical Data: Extubation.  PORTABLE CHEST - 1 VIEW  Comparison: 01/11/2012.  Findings: Endotracheal tube removed.  Right IJ central line persists.  Decreased lung volumes are present, expected after extubation.  Persistent bilateral effusions and basilar atelectasis. Cardiomediastinal contours appear unchanged.  IMPRESSION: Interval extubation with expected lower lung volumes.  Right IJ line persists.   Original Report Authenticated By: Andreas Newport, M.D.    Dg Chest Port 1 View  01/11/2012  *RADIOLOGY REPORT*  Clinical Data: Endotracheal tube placement.  PORTABLE CHEST - 1 VIEW  Comparison: 01/09/2012.  Findings: Endotracheal tube tip is 30 mm from the carina.  Lung volumes are lower than on prior exam.  Retrocardiac density is present, retrocardiac density is present likely representing atelectasis. There may be a small left pleural effusion.  Discoid atelectasis over the right  hemidiaphragm.  Right IJ central line is unchanged.  IMPRESSION:  1.  Stable support apparatus. 2.  Increasing bibasilar atelectasis.  Probable small left pleural effusion.   Original Report Authenticated By: Andreas Newport, M.D.    Dg Chest Port 1 View  01/09/2012  *RADIOLOGY REPORT*  Clinical Data: Pneumonia.  Pleural effusions.  PORTABLE CHEST - 1 VIEW  Comparison: 01/08/2012  Findings: Endotracheal tube tip 3.7 cm above the carina.  Right IJ line tip projects over the SVC.  Low lung volumes are present, causing crowding of the pulmonary vasculature.  The patient is rotated to the right on today's exam, resulting in reduced diagnostic sensitivity and specificity. Stable mild atelectasis noted along the right hemidiaphragm. Minimally improved aeration at the left lung base, although retrocardiac airspace opacity persists.  IMPRESSION:  1.  Stable appearance of bibasilar airspace opacity and low lung volumes.  Support apparatus unchanged.   Original Report Authenticated By: Dellia Cloud, M.D.    Dg Chest Port 1 View  01/08/2012  *RADIOLOGY REPORT*  Clinical Data: Pleural effusion.  PORTABLE CHEST - 1 VIEW  Comparison: Chest 01/06/2012 and 01/07/2012.  Findings: Support tubes and lines are unchanged. Left pleural effusion and left worse than right basilar airspace disease appear unchanged.  No pneumothorax identified.  IMPRESSION: No interval change.   Original Report Authenticated By: Bernadene Bell. Maricela Curet, M.D.    Dg Chest Port 1 View  01/07/2012  *RADIOLOGY REPORT*  Clinical Data: Endotracheal tube placement.  PORTABLE CHEST - 1 VIEW  Comparison: Chest 01/06/2012.  Findings: Endotracheal tube is in place with tip in good position at the level of the clavicular heads.  Feeding tube is no longer visualized.  Right IJ catheter remains in place.  Left basilar airspace opacity is unchanged.  Right lung appears clear.  No pneumothorax.  IMPRESSION:  1.  ET tube in good position. 2.  No change in left  basilar airspace opacity.   Original Report Authenticated By: Bernadene Bell. Maricela Curet, M.D.    Dg Chest Port 1 View  01/06/2012  *RADIOLOGY REPORT*  Clinical Data: Pleural effusion.  PORTABLE CHEST - 1 VIEW  Comparison: 01/05/2012.  Findings: Patient is rotated to the left.  The endotracheal tube appears similar to the prior exam.  The nasogastric tube has been replaced with a feeding tube and the tip is not visualized.  There is redundant tubing over the thoracic inlet and there may be a coiled loop of the feeding tube in the proximal esophagus or hypopharynx.  Consider retracting and re-advancing the feeding tube for better positioning and to see if the loop produces.  Bibasilar atelectasis remains present. Right IJ central line remains present.  IMPRESSION: 1.  Endotracheal tube and right IJ central line are unchanged. 2.  Exchange of nasogastric tube for feeding tube.  There appears to be a redundant loop in the hypopharynx and upper esophagus however this could be external to the patient as well.  The inspection is recommended. 3.  Persistent bilateral basilar atelectasis and probable left pleural effusion.  This is a call report.   Original Report Authenticated By: Andreas Newport, M.D.    Dg Chest Port 1 View  01/05/2012  *RADIOLOGY REPORT*  Clinical Data: Atelectasis.  Effusions.  PORTABLE CHEST - 1 VIEW  Comparison: Chest 01/03/2012 and 01/04/2012.  Findings: Support tubes and lines are unchanged.  Lung volumes are lower than on the most recent study with increased basilar atelectasis, worse on the left.  No pneumothorax is identified. Heart size is normal.  IMPRESSION: Increased bibasilar atelectasis.  No other change.   Original Report Authenticated By: Bernadene Bell. Maricela Curet, M.D.    Dg Chest Port 1 View  01/04/2012  *RADIOLOGY REPORT*  Clinical Data: Increased heart rate.  Decreased oxygen saturation.  PORTABLE CHEST - 1 VIEW  Comparison: Plain film chest 01/03/2012.  Findings: Support tubes and lines  are unchanged.  No pneumothorax is identified.  Discoid atelectasis in the lung bases is more notable on the right.  The lungs are otherwise clear.  IMPRESSION:  1.  Support apparatus in good position. 2.  Bibasilar atelectasis.   Original Report Authenticated By: Bernadene Bell. Maricela Curet, M.D.    Dg Chest Port 1 View  01/03/2012  *RADIOLOGY REPORT*  Clinical Data: Acute respiratory distress syndrome, endotracheal tube placement.  PORTABLE CHEST - 1 VIEW  Comparison: January 02, 2012.  Findings: Cardiomediastinal silhouette appears normal. Endotracheal tube is in grossly good position with tip several centimeters above the carina.  Nasogastric tube tip is coiled within proximal stomach.  Right internal jugular catheter line is unchanged in position. Left basilar opacity is unchanged compared to prior exam.  No change is noted in mild bilateral pleural effusions.  IMPRESSION: No change compared to prior exam.   Original Report Authenticated By: Venita Sheffield., M.D.    Dg Chest Port 1 View  01/02/2012  *RADIOLOGY REPORT*  Clinical Data: Endotracheal tube  PORTABLE CHEST - 1 VIEW  Comparison: Yesterday  Findings: Endotracheal tube tip 30.0 cm from the carina.  Lungs markedly under aerated with increasing bibasilar atelectasis and/or airspace disease.  Pleural effusions suspected.  Right internal jugular vein center venous catheter stable.  No pneumothorax.  IMPRESSION: Increasing bibasilar atelectasis verses airspace disease. Otherwise, no significant change.   Original Report Authenticated By: Donavan Burnet, M.D.    Dg Chest Port 1 View  01/01/2012  *RADIOLOGY REPORT*  Clinical Data: Evaluate endotracheal tube placement.  PORTABLE CHEST - 1 VIEW  Comparison: Chest x-ray 12/31/2011  Findings: An endotracheal tube is in place with tip 4.2 cm above the carina. There is a right-sided internal jugular central venous catheter with tip terminating  in the superior cavoatrial junction. A nasogastric tube is seen  extending into the stomach, however, the tip of the nasogastric tube extends below the lower margin of the image.  Lung volumes remain low, and there are bibasilar opacities, favored to represent atelectasis (underlying airspace consolidation is difficult to exclude, but is not favored.  Small bilateral pleural effusions are unchanged.  Pulmonary vascular congestion, without frank pulmonary edema.  Heart size appears upper limits of normal.  Mediastinal contours are unremarkable.  Atherosclerosis in the thoracic aorta.  IMPRESSION: 1.  Support apparatus, as above. 2.  Low lung volumes with persistent bibasilar atelectasis and small bilateral pleural effusions. 3.  Atherosclerosis.   Original Report Authenticated By: Florencia Reasons, M.D.    Dg Chest Port 1 View  12/31/2011  *RADIOLOGY REPORT*  Clinical Data: 56 year old female endotracheal tube.  Line placement.  Acute respiratory failure.  PORTABLE CHEST - 1 VIEW  Comparison: 12/31/2011 and earlier.  Findings: Semi upright AP portable view 1429 hours.  The patient is known debated.  Endotracheal tube tip is in good position between the level of clavicles and carina.  Enteric tube courses to the abdomen, partially looped within the known hiatal hernia but terminates below the level of the diaphragm (probably distal stomach).  Right IJ central line placed, distal tip felt to be at the T6-T7 level corresponding to the SVC.  Linear opacity below the level is felt related the spinous processes.  Bilateral pleural effusions.  Bibasilar collapse or consolidation. No pneumothorax.  Stable increased interstitial markings which may relate to vascular congestion.  Stable cardiac size and mediastinal contours.  IMPRESSION: 1.  Endotracheal tube and enteric tube appear in good position. 2.  Right IJ central line tip felt to terminate at the SVC level, linear opacity below the level felt related to the spinous processes. 3.  Bilateral pleural effusions and lower lobe  collapse consolidation with superimposed vascular congestion.   Original Report Authenticated By: Harley Hallmark, M.D.    Dg Chest Port 1 View  12/31/2011  *RADIOLOGY REPORT*  Clinical Data: Shortness of breath.  PORTABLE CHEST - 1 VIEW  Comparison: 12/30/2011  Findings: Shallow inspiration.  Cardiac enlargement with pulmonary vascular congestion and developing perihilar edema more prominent since previous study.  Small bilateral pleural effusions with basilar atelectasis or infiltration.  No pneumothorax.  IMPRESSION: Increasing pulmonary vascular congestion and edema with small bilateral pleural effusions and basilar atelectasis filtration.   Original Report Authenticated By: Marlon Pel, M.D.    Dg Abd Acute W/chest  12/26/2011  *RADIOLOGY REPORT*  Clinical Data: Epigastric pain and nausea.  ACUTE ABDOMEN SERIES (ABDOMEN 2 VIEW & CHEST 1 VIEW)  Comparison: CT abdomen and pelvis 09/27/2011.  Findings: Shallow inspiration with atelectasis or infiltration in the lung bases.  Normal heart size and pulmonary vascularity.  No blunting of costophrenic angles.  No pneumothorax.  Mediastinal contours are intact.  Calcification of the aorta.  Esophageal hiatal hernia behind the heart.  Scattered gas and stool in the colon.  No small or large bowel distension.  No abnormal air fluid levels.  No free air in the abdomen.  Surgical clips in the right upper quadrant.  Degenerative changes in the spine and hips.  IMPRESSION: Shallow inspiration with infiltration or atelectasis in the lung bases.  Esophageal hiatal hernia.  Nonobstructing bowel gas pattern.   Original Report Authenticated By: Marlon Pel, M.D.    Dg Intro Long Gi Tube  01/05/2012  *RADIOLOGY REPORT*  Clinical  Data: Feeding tube placement.  DG INTRO LONG GI TUBE  Technique:  Feeding tube placement under fluoroscopy guidance  Fluoroscopy time: 21 seconds  Comparison:  01/02/2012 CT  Findings:  Under real time observation, feeding tube advanced  distal to the hiatal hernia, into the intra-abdominal stomach. Unable to advance into the duodenum.  IMPRESSION: Feeding tube projects over the mid stomach. This is a suboptimal position for tube feeds, however recommend a follow-up radiograph to evaluate for distal migration of the tube via peristalsis.   Original Report Authenticated By: Waneta Martins, M.D.     Medications: I have reviewed the patient's current medications. Scheduled Meds:    . acetaminophen  1,000 mg Oral TID  . acidophilus  1 capsule Oral Daily  . antiseptic oral rinse  15 mL Mouth Rinse QID  . chlorhexidine  15 mL Mouth Rinse BID  . enoxaparin (LOVENOX) injection  40 mg Subcutaneous Q24H  . folic acid  1 mg Intravenous Daily  . furosemide  20 mg Oral Daily  . insulin aspart  2-6 Units Subcutaneous Q6H  . lip balm  1 application Topical BID  . lip balm      . LORazepam  1 mg Intravenous Q8H  . octreotide  100 mcg Subcutaneous Q8H  . pantoprazole (PROTONIX) IV  40 mg Intravenous Q12H  . piperacillin-tazobactam (ZOSYN)  IV  3.375 g Intravenous Q8H   Continuous Infusions:    . sodium chloride 20 mL/hr (01/13/12 0042)  . TPN (CLINIMIX) +/- additives 80 mL/hr at 01/23/12 1712  . TPN (CLINIMIX) +/- additives 80 mL/hr at 01/24/12 1718   PRN Meds:.acetaminophen, diphenhydrAMINE, HYDROmorphone (DILAUDID) injection, magic mouthwash, menthol-cetylpyridinium, ondansetron (ZOFRAN) IV, oxyCODONE, polyvinyl alcohol, sodium chloride Assessment/Plan: Patient Active Hospital Problem List:  Ruptured pancreatic pseudocyst with peritonitis:  - patient has had a very complicated course. Currently she is on day #20 of Zosyn. Flagyl discontinued yesterday. The duration of antibiotic treatment will be determined by Gen. surgery and is unspecified at this time. -Patient had a new area of collection in the abdomen and had a JP drain placed by interventional radiology yesterday morning which has had about 30 cc of fluid  out. -Nutrition management per surgery   Anemia: -s/p transfusion of one unit packed blood cells on 9/28 -Hemoglobin drifted down to 7.2 today. Patient currently receiving blood transfusion per surgery.  -We'll continue to monitor     Gastric ulcer: -Patient status post EGD which showed gastric ulcer.  -Continue twice a day PPI treatment   C. difficile colitis: -Patient has been fully treated with more than 14 days of Flagyl. -Repeat C. difficile negative   Nutrition:  -Patient continues on TNA per surgery. Blood sugars associated with TNA use well-controlled at this time.    The current management of this patient's active medical problems are solely under general surgery.. I will ask the surgeon is to assume care of this patient as we're adding nothing to the patient's therapeutic plan  and management   LOS: 29 days

## 2012-01-24 NOTE — Progress Notes (Signed)
PARENTERAL NUTRITION CONSULT NOTE - FOLLOW UP  Pharmacy Consult for TNA Indication: Severe Pancreatitis  Allergies  Allergen Reactions  . Ciprofloxacin Nausea Only    REACTION: nausea   Patient Measurements: Height: 5\' 6"  (167.6 cm) Weight: 166 lb 10.7 oz (75.6 kg) IBW/kg (Calculated) : 59.3   Vital Signs: Temp: 98.1 F (36.7 C) (10/05 2218) Temp src: Oral (10/05 2218) BP: 125/78 mmHg (10/05 2218) Pulse Rate: 79  (10/05 2218) Intake/Output from previous day: 10/05 0701 - 10/06 0700 In: 1370 [I.V.:50; IV Piggyback:150; TPN:1170] Out: 300 [Urine:300] Intake/Output from this shift: Total I/O In: -  Out: 300 [Urine:300]  Labs:  [facility] 01/22/12 0800 01/21/12 0950  WBC 11.2* 13.3*  HGB 9.9* 7.7*  HCT 30.1* 25.0*  PLT 483* 568*  APTT -- --  INR -- --    Basename 01/22/12 0800  NA 132*  K 3.8  CL 98  CO2 26  GLUCOSE 175*  BUN 11  CREATININE 0.53  LABCREA --  CREAT24HRUR --  CALCIUM 8.2*  MG --  PHOS --  PROT --  ALBUMIN --  AST --  ALT --  ALKPHOS --  BILITOT --  BILIDIR --  IBILI --  PREALBUMIN --  TRIG --  CHOLHDL --  CHOL --  Corrected Calcium = 10 Estimated Creatinine Clearance: 81.6 ml/min (by C-G formula based on Cr of 0.53).    Basename 01/24/12 0015 01/23/12 1747 01/23/12 1218  GLUCAP 143* 133* 129*   Insulin Requirements in the past 24 hours:  CBGs < 150, required 8 units of SSI last 24h  Nutritional Goals:  RD recs 9/16: 1762 kcal, 86-95 grams protein Reestimated s/p extubation 9/30: 1475-1775 kcal, 94-110 grams protein Clinimix 5/15 @ 75ml/hr + Lipid 20% MWF will provide 96 g protein/day, 1843 kCal on lipid days, 1363 Kcal on non-lipid days for an average of 1569 Kcal per day/week.  Current nutrition:  Diet: Clear Liquid started 10/3  mIVF: NS @ KVO Clinimix E 5/15 @ 80 ml/hr +  IVFE 20% @ 10 ml/hr on MWF  Assessment:  [name] with h/o recurrent pancreatitis and ETOH abuse, s/p ERCP with sphincterotomy on 10/06/2011 here this  admit with acute pancreatitis, developed ileus. Rapid response called 9/12 for respiratory decline and lethargy - pt transfer to ICU and CCM consulted. Intubated 9/12. 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   Pt underwent exploratory laparotomy with debridement of necrotic pancreas and drainage of infected, ruptured pseudocyst 9/15. TNA started 9/16.  9/30 CT abd = extensive loculated fluid in both sides of the abdomen consistent with a pseudocyst.  Additional drained placed by IR 10/1, 10/3. Diet advanced to clear liquids but no intake recorded 10/4-10/5.  Other medication issues: Octreotide added 9/25, is this still necessary? Can folic acid be changed to po?  Labs: (10/4) Renal: Scr wnl/stable Hepatic fxn: AST/ALT previously elevated ?shock liver 2/2 pancreatitis, now WNL Electrolytes: Na+ slightly low but unable to adjust in TNA, other lytes wnl Pre-Alb: improving, 2.3 (9/17), 12.2 (9/23), 11.4 (9/30), recheck in am TG/Cholesterol: WNL on  9/17, 9/23, 9/30, recheck in am CBGs: at goal, requiring minimal insulin   Plan:    Continue clinimix to E 5/15 at 80 ml/hr, latest prealbumin reveals maintaining protein stores (level remains mod decreased)  PO advancement per CCS, will monitor  TNA to contain IV fat emulsion, standard multivitamins and trace elements only on MWF only due to ongoing shortage   TNA  labs Monday/Thursdays  Pharmacy will follow up  Loralee Pacas, PharmD, BCPS Pager: [phone] 01/24/2012 6:46 AM

## 2012-01-24 NOTE — Progress Notes (Signed)
Per pts RN, pt picking at dressing right IJ.  Reinforced instructions to keep on 48 hours and can be removed Monday evening.  Dressing loose, reapplied.

## 2012-01-25 LAB — COMPREHENSIVE METABOLIC PANEL
ALT: 6 U/L (ref 0–35)
AST: 15 U/L (ref 0–37)
CO2: 26 mEq/L (ref 19–32)
Calcium: 8.4 mg/dL (ref 8.4–10.5)
Chloride: 102 mEq/L (ref 96–112)
GFR calc Af Amer: >90 mL/min (ref >90)
GFR calc non Af Amer: >90 mL/min (ref >90)
Glucose, Bld: 111 mg/dL — ABNORMAL HIGH (ref 70–99)
Sodium: 135 mEq/L (ref 135–145)
Total Bilirubin: 0.4 mg/dL (ref 0.3–1.2)

## 2012-01-25 LAB — CBC
HCT: 32.5 % — ABNORMAL LOW (ref 36.0–46.0)
MCH: 27.8 pg (ref 26.0–34.0)
MCHC: 31.1 g/dL (ref 30.0–36.0)
MCV: 89.5 fL (ref 78.0–100.0)
Platelets: 448 10*3/uL — ABNORMAL HIGH (ref 150–400)
RDW: 17.9 % — ABNORMAL HIGH (ref 11.5–15.5)
WBC: 9.8 10*3/uL (ref 4.0–10.5)

## 2012-01-25 LAB — CHOLESTEROL, TOTAL: Cholesterol: 90 mg/dL (ref 0–200)

## 2012-01-25 LAB — DIFFERENTIAL
Basophils Absolute: 0.1 10*3/uL (ref 0.0–0.1)
Basophils Relative: 1 % (ref 0–1)
Eosinophils Absolute: 0.2 10*3/uL (ref 0.0–0.7)
Eosinophils Relative: 2 % (ref 0–5)
Monocytes Absolute: 1 10*3/uL (ref 0.1–1.0)

## 2012-01-25 LAB — GLUCOSE, CAPILLARY
Glucose-Capillary: 119 mg/dL — ABNORMAL HIGH (ref 70–99)
Glucose-Capillary: 104 mg/dL — ABNORMAL HIGH (ref 70–99)

## 2012-01-25 MED ORDER — FOLIC ACID 1 MG PO TABS
1.0000 mg | ORAL_TABLET | Freq: Every day | ORAL | Status: DC
  Administered 2012-01-25 – 2012-02-09 (×16): 1 mg via ORAL
  Filled 2012-01-25 (×16): qty 1

## 2012-01-25 MED ORDER — INSULIN ASPART 100 UNIT/ML ~~LOC~~ SOLN
2.0000 [IU] | Freq: Three times a day (TID) | SUBCUTANEOUS | Status: DC
  Administered 2012-01-25 – 2012-01-28 (×6): 2 [IU] via SUBCUTANEOUS
  Administered 2012-01-29: 4 [IU] via SUBCUTANEOUS
  Administered 2012-01-29 – 2012-01-31 (×6): 2 [IU] via SUBCUTANEOUS

## 2012-01-25 MED ORDER — FAT EMULSION 20 % IV EMUL
250.0000 mL | INTRAVENOUS | Status: AC
  Administered 2012-01-25: 250 mL via INTRAVENOUS
  Filled 2012-01-25: qty 250

## 2012-01-25 MED ORDER — ADULT MULTIVITAMIN W/MINERALS CH
1.0000 | ORAL_TABLET | Freq: Every day | ORAL | Status: DC
  Administered 2012-01-25 – 2012-02-09 (×16): 1 via ORAL
  Filled 2012-01-25 (×16): qty 1

## 2012-01-25 MED ORDER — ENSURE COMPLETE PO LIQD
237.0000 mL | Freq: Two times a day (BID) | ORAL | Status: DC
  Administered 2012-01-25: 237 mL via ORAL

## 2012-01-25 MED ORDER — CLINIMIX E/DEXTROSE (5/15) 5 % IV SOLN
INTRAVENOUS | Status: AC
  Administered 2012-01-25: 19:00:00 via INTRAVENOUS
  Filled 2012-01-25: qty 2000

## 2012-01-25 NOTE — Progress Notes (Signed)
PARENTERAL NUTRITION CONSULT NOTE - FOLLOW UP  Pharmacy Consult for TNA Indication: Severe Pancreatitis  Allergies  Allergen Reactions  . Ciprofloxacin Nausea Only    REACTION: nausea   Patient Measurements: Height: 5\' 6"  (167.6 cm) Weight: 166 lb 10.7 oz (75.6 kg) IBW/kg (Calculated) : 59.3   Vital Signs: Temp: 98.4 F (36.9 C) (10/07 0630) Temp src: Oral (10/07 0630) BP: 108/70 mmHg (10/07 0630) Pulse Rate: 79  (10/07 0630) Intake/Output from previous day: 10/06 0701 - 10/07 0700 In: 1200 [I.V.:240; TPN:960] Out: 800 [Urine:800] Intake/Output from this shift:    Labs:  [facility] 01/25/12 0540  WBC 9.8  HGB 10.1*  HCT 32.5*  PLT 448*  APTT --  INR --    Basename 01/25/12 0606 01/25/12 0540  NA -- 135  K -- 4.0  CL -- 102  CO2 -- 26  GLUCOSE -- 111*  BUN -- 11  CREATININE -- 0.50  LABCREA -- --  CREAT24HRUR -- --  CALCIUM -- 8.4  MG -- 1.9  PHOS -- 3.5  PROT -- 7.1  ALBUMIN -- 1.9*  AST -- 15  ALT -- 6  ALKPHOS -- 81  BILITOT -- 0.4  BILIDIR -- --  IBILI -- --  PREALBUMIN -- --  TRIG 152* --  CHOLHDL -- --  CHOL 90 --  Corrected Calcium = 10 Estimated Creatinine Clearance: 81.6 ml/min (by C-G formula based on Cr of 0.5).    Basename 01/25/12 0556 01/25/12 0001 01/24/12 2129  GLUCAP 119* 141* 118*   Insulin Requirements in the past 24 hours:  CBGs < 150, required 4 units of SSI last 24h  Nutritional Goals:  RD recs 9/16: 1762 kcal, 86-95 grams protein Reestimated s/p extubation 9/30: 1475-1775 kcal, 94-110 grams protein Clinimix 5/15 @ 22ml/hr + Lipid 20% MWF will provide 96 g protein/day, 1843 kCal on lipid days, 1363 Kcal on non-lipid days for an average of 1569 Kcal per day/week.  Current nutrition:  Diet: Full Liquid started 10/7 mIVF: NS @ KVO Clinimix E 5/15 @ 80 ml/hr +  IVFE 20% @ 10 ml/hr on MWF  Assessment:  [name] with h/o recurrent pancreatitis and ETOH abuse, s/p ERCP with sphincterotomy on 10/06/2011 here this admit with  acute pancreatitis, developed ileus. Rapid response called 9/12 for respiratory decline and lethargy - pt transfer to ICU and CCM consulted. Intubated 9/12. 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   Pt underwent exploratory laparotomy with debridement of necrotic pancreas and drainage of infected, ruptured pseudocyst 9/15. TNA started 9/16.  9/30 CT abd = extensive loculated fluid in both sides of the abdomen consistent with a pseudocyst.  Additional drained placed by IR 10/1, 10/3. Advancing diet but low intake so far.   Other medication issues: Octreotide added 9/25, is this still necessary?  Labs: (10/4) Renal: Scr wnl/stable Hepatic fxn: AST/ALT improved to wnl. Electrolytes: wnl. Pre-Alb: pending. 2.3 (9/17), 12.2 (9/23), 11.4 (9/30). TG/Cholesterol: TG slightly above normal range but okay on TNA.  CBGs: at goal, requiring minimal insulin   Plan:    Continue clinimix to E 5/15 at 80 ml/hr.  IV fat emulsion on MWF only due to ongoing shortage.  Tolerating PO meds - change folic acid and MVI/TE to oral route.   F/u tolerance of diet for tapering of TNA soon.  MD: consider changing the following to oral route: Protonix, scheduled Ativan.  TNA labs Monday/Thursdays.  Bmet in am.  Charolotte Eke,  PharmD, pager [phone]. 01/25/2012,9:43 AM.

## 2012-01-25 NOTE — Progress Notes (Signed)
Nutrition Follow-up  Intervention: TPN per pharmacy. Diet advancement per MD. Ensure Complete BID. Hopefully as intake improves, TPN can be weaned. Recommend MD adding Florastor to help with gut function. Will monitor.   Diet Order: Full liquid  TPN: Clinimix E 5/15 @ 80 ml/hr. Lipids (20% IVFE @ 10 ml/hr), multivitamins, and trace elements are provided 3 times weekly (MWF) due to national backorder. Provides 1569 kcal and 96 grams protein daily (based on weekly average). Meets 106% minimum estimated kcal and 102% minimum estimated protein needs.  Additional IVF with NS @ 10-20 ml/hr.  - Pt had CT guided abscess drain placed on 10/1 for pseudocyst. Pt reports tolerating clear liquid diet well and denies any nausea. Pt states she had a jello for breakfast and does not like the broths because they are too salty. Pt will order full liquid tray for lunch. Pt states she is not hungry however states she may have had hunger pains on/off or it was just regular pain.   - CBGs controlled - Albumin low but stable, not likely to improve soon r/t inflammatory processes of her illnesses  - Pt with slightly elevated triglycerides - No new lipase ordered since 01/11/12  Meds: Scheduled Meds:   . acetaminophen  1,000 mg Oral TID  . acidophilus  1 capsule Oral Daily  . antiseptic oral rinse  15 mL Mouth Rinse QID  . chlorhexidine  15 mL Mouth Rinse BID  . enoxaparin (LOVENOX) injection  40 mg Subcutaneous Q24H  . folic acid  1 mg Oral Daily  . furosemide  20 mg Oral Daily  . insulin aspart  2-6 Units Subcutaneous TID AC & HS  . lip balm  1 application Topical BID  . LORazepam  1 mg Intravenous Q8H  . multivitamin with minerals  1 tablet Oral Daily  . octreotide  100 mcg Subcutaneous Q8H  . pantoprazole (PROTONIX) IV  40 mg Intravenous Q12H  . piperacillin-tazobactam (ZOSYN)  IV  3.375 g Intravenous Q8H  . DISCONTD: folic acid  1 mg Intravenous Daily  . DISCONTD: insulin aspart  2-6 Units Subcutaneous  Q6H   Continuous Infusions:   . sodium chloride 20 mL/hr (01/13/12 0042)  . TPN (CLINIMIX) +/- additives     And  . fat emulsion    . TPN (CLINIMIX) +/- additives 80 mL/hr at 01/23/12 1712  . TPN (CLINIMIX) +/- additives 80 mL/hr at 01/24/12 1718   PRN Meds:.acetaminophen, diphenhydrAMINE, HYDROmorphone (DILAUDID) injection, magic mouthwash, menthol-cetylpyridinium, ondansetron (ZOFRAN) IV, oxyCODONE, polyvinyl alcohol, sodium chloride  Labs:  CMP     Component Value Date/Time   NA 135 01/25/2012 0540   K 4.0 01/25/2012 0540   CL 102 01/25/2012 0540   CO2 26 01/25/2012 0540   GLUCOSE 111* 01/25/2012 0540   BUN 11 01/25/2012 0540   CREATININE 0.50 01/25/2012 0540   CALCIUM 8.4 01/25/2012 0540   PROT 7.1 01/25/2012 0540   ALBUMIN 1.9* 01/25/2012 0540   AST 15 01/25/2012 0540   ALT 6 01/25/2012 0540   ALKPHOS 81 01/25/2012 0540   BILITOT 0.4 01/25/2012 0540   GFRNONAA >90 01/25/2012 0540   GFRAA >90 01/25/2012 0540   Lipase  Date Value Range Status  01/11/2012 113* 11 - 59 U/L Final     Prealbumin  Date Value Range Status  01/18/2012 11.4* 17.0 - 34.0 mg/dL Final   CBG (last 3)   Basename 01/25/12 0556 01/25/12 0001 01/24/12 2129  GLUCAP 119* 141* 118*      Intake/Output Summary (  Last 24 hours) at 01/25/12 0950 Last data filed at 01/25/12 0500  Gross per 24 hour  Intake   1200 ml  Output    800 ml  Net    400 ml   Last BM - 10/6, diarrhea per pt report   Weight Status:  No new weights  Re-estimated needs:   1775-1900 calories 110-120g protein  Nutrition Dx: Inadequate oral intake now r/t severe pancreatitis AEB NPO - no longer appropriate as pt now on full liquids.   New nutrition dx: Inadequate oral intake r/t poor appetite AEB <50% meal intake.   Goal:  1. Diet advancement - met, pt now on full liquids.  2. TPN to meet >90% of estimated energy needs - met  3. Promote weight maintenance - met, pt with weight gain since admission, however likely mostly fluid  related   Monitor: Weights, labs, diet advancement, intake, BM, TPN, lipase  Levon Hedger MS, RD, LDN [phone] Pager [phone] After Hours Pager

## 2012-01-25 NOTE — Progress Notes (Addendum)
Wound vac changed as ordered area healing, with min. Amt bleeding, red in color. Area measures 12cm in length and 3.5cm at top, bottom 3cm of wound.   At top of wound at 12o'clock yellow area noted with stiches exposed measures 2.5cm x 1.5cm.

## 2012-01-25 NOTE — Progress Notes (Signed)
Patient ID: [name], female   DOB: December 26, 1955, 56 y.o.   MRN: [id number] 19 Days Post-Op  Subjective: Pt feels ok, some intermittent pain.  Tolerating clear liquids, just not eating very much secondary to decreased appetite.  Objective: Vital signs in last 24 hours: Temp:  [98 F (36.7 C)-98.4 F (36.9 C)] 98.4 F (36.9 C) (10/07 0630) Pulse Rate:  [73-80] 79  (10/07 0630) Resp:  [18] 18  (10/07 0630) BP: (97-134)/(51-71) 108/70 mmHg (10/07 0630) SpO2:  [92 %-95 %] 92 % (10/07 0630) Last BM Date: 01/24/12  Intake/Output from previous day: 10/06 0701 - 10/07 0700 In: 1200 [I.V.:240; TPN:960] Out: 800 [Urine:800] Intake/Output this shift:    PE: Abd: soft, VAC in place, drains with tan cloudy purulent looking material in all 3 drains.  Lab Results:   [facility] 01/25/12 0540  WBC 9.8  HGB 10.1*  HCT 32.5*  PLT 448*   BMET  Basename 01/25/12 0540  NA 135  K 4.0  CL 102  CO2 26  GLUCOSE 111*  BUN 11  CREATININE 0.50  CALCIUM 8.4   PT/INR No results found for this basename: LABPROT:2,INR:2 in the last 72 hours CMP     Component Value Date/Time   NA 135 01/25/2012 0540   K 4.0 01/25/2012 0540   CL 102 01/25/2012 0540   CO2 26 01/25/2012 0540   GLUCOSE 111* 01/25/2012 0540   BUN 11 01/25/2012 0540   CREATININE 0.50 01/25/2012 0540   CALCIUM 8.4 01/25/2012 0540   PROT 7.1 01/25/2012 0540   ALBUMIN 1.9* 01/25/2012 0540   AST 15 01/25/2012 0540   ALT 6 01/25/2012 0540   ALKPHOS 81 01/25/2012 0540   BILITOT 0.4 01/25/2012 0540   GFRNONAA >90 01/25/2012 0540   GFRAA >90 01/25/2012 0540   Lipase     Component Value Date/Time   LIPASE 113* 01/11/2012 0540    Studies/Results: No results found.  Anti-infectives: Anti-infectives     Start     Dose/Rate Route Frequency Ordered Stop   01/23/12 1800  piperacillin-tazobactam (ZOSYN) IVPB 3.375 g       3.375 g 12.5 mL/hr over 240 Minutes Intravenous Every 8 hours 01/23/12 1155 01/30/12 1759   01/03/12 1600    metroNIDAZOLE (FLAGYL) IVPB 500 mg  Status:  Discontinued        500 mg 100 mL/hr over 60 Minutes Intravenous Every 8 hours 01/03/12 1525 01/23/12 1155   12/31/11 1100   vancomycin (VANCOCIN) 750 mg in sodium chloride 0.9 % 150 mL IVPB  Status:  Discontinued        750 mg 150 mL/hr over 60 Minutes Intravenous Every 12 hours 12/31/11 0958 01/03/12 0849   12/30/11 1000   piperacillin-tazobactam (ZOSYN) IVPB 3.375 g  Status:  Discontinued        3.375 g 12.5 mL/hr over 240 Minutes Intravenous Every 8 hours 12/30/11 0901 01/23/12 1155           Assessment/Plan Ruptured pseudocyst with peritonitis, s/p Exploratory laparotomy with debridement of necrotic pancreas and drainage of the upper abdomen 01/03/12 Dr. Wenda Low.   Post op drainage from pseudocyst   WBC still up with intraabdominal fluid collection on CT 01/18/12, for IR drain 01/19/12. 300 ml purulent drainage.   Patient has 3 drains - 2 placed intraoperatively and one placed by IR.  VAC on wound (changed MWF)   Post op VDRF on Vent, extubated 01/11/12  Anemia H/H= 7.3/22.3 Transfused on 01/21/12.  Hx of pancreatitis, and alcohol  abuse.  GERD  Elevated liver function tests  GERD (gastroesophageal reflux disease)  Hiatal Hernia  9/18 EGD ([facility]) - 10-12 mm ulcer in the cardia of the stomach, protonix gtt, removed panda  Current ABX: flagyl, started 9/15, Zosyn, started 9/11 PCM/TNA  Plan: 1. Will try full liquids today.  Patient not eating much clears due to decreased appetite, not due to intolerance. 2. Cont TNA for now as she is not taking in sufficient amounts to maintain caloric needs. 3. Cont drains, will need repeat CT scan towards middle of this week.   LOS: 30 days    OSBORNE,KELLY E 01/25/2012, 8:14 AM Pager: [phone]  Patient making slow progress.  To advance diet.  Agree with above.  Ovidio Kin, MD, [facility] Surgery Pager: [phone] Office phone:  [phone]

## 2012-01-25 NOTE — Progress Notes (Signed)
Occupational Therapy Treatment Patient Details Name: [name] MRN: [id number] DOB: [birth year] Today's Date: 01/25/2012 Time: 5784-6962 OT Time Calculation (min): 29 min  OT Assessment / Plan / Recommendation Comments on Treatment Session Pt tolerated session well with better activity tolerance but still limited by pain.     Follow Up Recommendations  Inpatient Rehab;Skilled nursing facility    Barriers to Discharge       Equipment Recommendations  Rolling walker with 5" wheels;3 in 1 bedside comode    Recommendations for Other Services    Frequency Min 2X/week   Plan Discharge plan remains appropriate    Precautions / Restrictions Precautions Precautions: Fall Precaution Comments: 3 abdominal drains and wound vac Restrictions Weight Bearing Restrictions: No        ADL  Upper Body Bathing: Performed;Chest;Right arm;Left arm;Abdomen;Set up;Supervision/safety;Other (comment) (supervision for lines) Where Assessed - Upper Body Bathing: Unsupported sitting Lower Body Bathing: Performed;Min guard Where Assessed - Lower Body Bathing: Supported sit to stand Upper Body Dressing: Simulated;Set up Where Assessed - Upper Body Dressing: Unsupported sitting Lower Body Dressing: Simulated;Set up;Other (comment) (socks only) Where Assessed - Lower Body Dressing: Unsupported sitting Toilet Transfer: Performed;Minimal assistance Toilet Transfer Method: Stand pivot Toilet Transfer Equipment: Bedside commode;Other (comment) (from EOB to [facility] and back to EOB. Then to chair across room.) Toileting - Clothing Manipulation and Hygiene: Performed;Min guard Where Assessed - Toileting Clothing Manipulation and Hygiene: Sit to stand from 3-in-1 or toilet Equipment Used: Rolling walker ADL Comments: Pt up to EOB to perform bath and then pivoted to [facility] and back to EOB to finish bath. Then used RW to transfer around the end of the bed to the other side of bed to sit in recliner. Tolerated  activity well with some fatigue but more limited by pain. 10/10 briefly with reaching to wring out washcloth with some twisting at abdomen. Down to 6 with rest.     OT Diagnosis:    OT Problem List:   OT Treatment Interventions:     OT Goals ADL Goals ADL Goal: Grooming - Progress: Not met (required +2 when performed for safety) ADL Goal: Toilet Transfer - Progress: Progressing toward goals ADL Goal: Toileting - Clothing Manipulation - Progress: Met ADL Goal: Toileting - Hygiene - Progress: Met ADL Goal: Additional Goal #1 - Progress: Met (clinical judgement that she can reach feet to don/doff socks and would be able to start pants over feet and could stand to wash periareas similar to pulling up pants. Would only need assist to manage lines/drains) Arm Goals Arm Goal: Theraband Exercises - Progress:  (have not yet addressed.) Miscellaneous OT Goals Miscellaneous OT Goal #1: updated goal 01/26/12 Pt will perform grooming standing at the sink with min guard assist.  OT Goal: Miscellaneous Goal #1 - Progress: Goal set today Miscellaneous OT Goal #2: Updated goal 01/26/12 Pt will peform all aspects of B/D including own setup with min guard assist. OT Goal: Miscellaneous Goal #2 - Progress: Goal set today Miscellaneous OT Goal #3: Updated goal 01/26/12 pt will transfer on and off the 3in1 over toilet with min guard assist OT Goal: Miscellaneous Goal #3 - Progress: Goal set today Miscellaneous OT Goal #4: Updated goal 01/26/12 Pt will perform yellow theraband exercises with supervision for 10 repts and 2 sets to improve activity tolerance for ADL.  OT Goal: Miscellaneous Goal #4 - Progress: Goal set today  Visit Information  Last OT Received On: 01/25/12 Assistance Needed: +1    Subjective Data  Subjective: sure.  I want to do what I can Patient Stated Goal: to get free of some of the drains and lines to do more   Prior Functioning       Cognition  Overall Cognitive Status: Appears within  functional limits for tasks assessed/performed Arousal/Alertness: Awake/alert Behavior During Session: [facility] for tasks performed    Mobility  Shoulder Instructions Bed Mobility Bed Mobility: Supine to Sit Rolling Right: 5: Supervision Transfers Transfers: Sit to Stand;Stand to Sit Sit to Stand: 4: Min guard;With upper extremity assist;From bed;From chair/3-in-1 Stand to Sit: 4: Min guard;With upper extremity assist;To chair/3-in-1;To bed Details for Transfer Assistance: min verbal cues for hand placement to stand from EOB.        Exercises      Balance     End of Session OT - End of Session Activity Tolerance: Patient limited by pain Patient left: in chair;with call bell/phone within reach  GO     [name] [phone] 01/25/2012, 9:59 AM

## 2012-01-25 NOTE — Progress Notes (Signed)
Rehab admissions - Making good functional gains.  Continues with 3 drains.  On clear liquids and continued TNA.  Lives alone with little support.  I feel patient will need SNF at discharge.  I will sign off for inpatient rehab at this time.  Call me for questions.  #[phone]

## 2012-01-25 NOTE — Progress Notes (Signed)
19 Days Post-Op  Subjective: Tender at drain site - "sorest part of my belly"  Objective: Vital signs in last 24 hours: Temp:  [98 F (36.7 C)-98.4 F (36.9 C)] 98.4 F (36.9 C) (10/07 0630) Pulse Rate:  [73-80] 79  (10/07 0630) Resp:  [18] 18  (10/07 0630) BP: (97-134)/(51-71) 108/70 mmHg (10/07 0630) SpO2:  [92 %-95 %] 92 % (10/07 0630) Last BM Date: 01/24/12  Intake/Output from previous day: 10/06 0701 - 10/07 0700 In: 1200 [I.V.:240; TPN:960] Out: 800 [Urine:800]    PE:  Drains intact with insertion sites clean and dry. Stitch from surgical JP drain is close to insertion site of IR drain - area with erythema and tender to touch.  Output declining from right drain - was 90 ml for several days then down to 50 ml 10/5.  No recordings for yesterday.  Tan colored fluid in bag.  Few bowel sounds.  Lab Results:   [facility] 01/25/12 0540  WBC 9.8  HGB 10.1*  HCT 32.5*  PLT 448*   BMET  Basename 01/25/12 0540  NA 135  K 4.0  CL 102  CO2 26  GLUCOSE 111*  BUN 11  CREATININE 0.50  CALCIUM 8.4     Studies/Results:  RADIOLOGY REPORT*  CT GUIDED ABSCESS DRAIN PLACEMENT  Date: 01/19/2012  Clinical History: History of necrotic pancreatitis 10 days status  post exploratory laparotomy, pancreatic necrosectomy, and placement  of to abdominal drains. She now has a large peripherally enhancing  loculated peritoneal fluid collection concerning for abscess.  Percutaneous drain is requested.  Procedures Performed:  1. CT guided drain placement  Interventional Radiologist: Sterling Big, MD  Sedation: Moderate (conscious) sedation was used. 4 mg Versed, 200  mcg Fentanyl were administered intravenously. The patient's vital  signs were monitored continuously by radiology nursing throughout  the procedure.  Sedation Time: 25 minutes  PROCEDURE/FINDINGS:  Informed consent was obtained from the patient following  explanation of the procedure, risks, benefits and  alternatives.  The patient understands, agrees and consents for the procedure.  All questions were addressed. A time out was performed.  Maximal barrier sterile technique utilized including caps, mask,  sterile gowns, sterile gloves, large sterile drape, hand hygiene,  and betadine skin prep.  A planning axial CT scan was performed.  The large right hemi abdominal peritoneal fluid collection was  identified. An appropriate skin entry site was selected and  marked. Local anesthesia was achieved with infiltration of 1%  lidocaine. Using CT guidance, the fluid collection was punctured  with an 18 gauge trocar needle. A guide wire was advanced caudally  within the fluid collection down into the anatomic pelvis. The  tract was then serially dilated to 12 [place] and a 12-[place]  all-purpose drainage catheter was advanced over the wire and  positioned within the anatomic pelvis. The 12-[place] drainage  catheter was modified with multiple additional side holes covering  approximately 20 cm of its length to facilitate complete drainage  of the fluid collection and prevent loculation into pockets.  Approximately 300 ml of purulent fluid was initially aspirated.  The tube was connected to gravity drainage and secured to the skin  with both Prolene suture and a plastic bumper.  The patient tolerated the procedure well, there was no immediate  complication. She was returned to her room in stable condition. A  sample material was sent for culture.  IMPRESSION:  Successful placement of a 31 [place] Cook all-purpose drainage  catheter modified with  additional side holes into the right  abdominal peritoneal abscess.  Approximately 300 ml of purulent material was aspirated. A sample  was sent for culture.  Signed,  Sterling Big, MD  Vascular & Interventional Radiologist  [facility] Radiology  Original Report Authenticated By: Vilma Prader      Anti-infectives: Anti-infectives      Start     Dose/Rate Route Frequency Ordered Stop   01/23/12 1800  piperacillin-tazobactam (ZOSYN) IVPB 3.375 g       3.375 g 12.5 mL/hr over 240 Minutes Intravenous Every 8 hours 01/23/12 1155 01/30/12 1759   01/03/12 1600   metroNIDAZOLE (FLAGYL) IVPB 500 mg  Status:  Discontinued        500 mg 100 mL/hr over 60 Minutes Intravenous Every 8 hours 01/03/12 1525 01/23/12 1155   12/31/11 1100   vancomycin (VANCOCIN) 750 mg in sodium chloride 0.9 % 150 mL IVPB  Status:  Discontinued        750 mg 150 mL/hr over 60 Minutes Intravenous Every 12 hours 12/31/11 0958 01/03/12 0849   12/30/11 1000   piperacillin-tazobactam (ZOSYN) IVPB 3.375 g  Status:  Discontinued        3.375 g 12.5 mL/hr over 240 Minutes Intravenous Every 8 hours 12/30/11 0901 01/23/12 1155          Assessment/Plan: Necrotic pancreatitis with abdominal collection s/p percutaneous drainage 01/19/12.  Continue drain and flushes - will order warm and cold compresses to site to help with pain.  CAMPBELL,PAMELA D 01/25/2012

## 2012-01-25 NOTE — Progress Notes (Signed)
PT Cancellation Note  Patient Details Name: [name] MRN: [id number] DOB: 01/25/1956   Cancelled Treatment:     Pt declined to participate this p.m.-"I'm just not up to it right now. I've had a busy day." Pt requested PT check back on tomorrow.    Rebeca Alert [facility] 01/25/2012, 4:09 PM [phone]

## 2012-01-25 NOTE — Progress Notes (Signed)
Subjective: Patient states that she feels well.  Interval history: I've spoken with Dr. gross at surgery who feels that at this time the patient still requires acute hospitalization given that the lacerating her for abscess was only 48 hours ago. He does not believe that at this time the patient is a candidate for LTAC.  Objective: Filed Vitals:   01/24/12 1751 01/24/12 2308 01/25/12 0630 01/25/12 1429  BP: 134/70 109/71 108/70 100/66  Pulse:  80 79 75  Temp:  98 F (36.7 C) 98.4 F (36.9 C) 98 F (36.7 C)  TempSrc:  Oral Oral Oral  Resp:  18 18 20   Height:      Weight:      SpO2:  95% 92% 97%   Weight change:   Intake/Output Summary (Last 24 hours) at 01/25/12 1824 Last data filed at 01/25/12 1422  Gross per 24 hour  Intake    640 ml  Output    730 ml  Net    -90 ml    General: Alert, awake, oriented x3, in no acute distress.  HEENT: [place]/AT PEERL, EOMI Heart: Regular rate and rhythm.  Lungs: Clear to auscultation Abdomen: Mildly distended. Abdominal wound observed while dressing being changed. There is red beefy tissue with good granulation. No drainage noted. Wound appears more contracted than observed 2 days ago. Drains still draining tan colored output. Neuro: No focal neurological deficits noted. Musculoskeletal: No warm swelling or erythema around joints, no spinal tenderness noted. I've so the patient ambulating around the room and she seems to be doing much better    Lab Results:  [name] 01/25/12 0540  NA 135  K 4.0  CL 102  CO2 26  GLUCOSE 111*  BUN 11  CREATININE 0.50  CALCIUM 8.4  MG 1.9  PHOS 3.5    Basename 01/25/12 0540  AST 15  ALT 6  ALKPHOS 81  BILITOT 0.4  PROT 7.1  ALBUMIN 1.9*   No results found for this basename: LIPASE:2,AMYLASE:2 in the last 72 hours  Basename 01/25/12 0540  WBC 9.8  NEUTROABS 6.5  HGB 10.1*  HCT 32.5*  MCV 89.5  PLT 448*   No results found for this basename: CKTOTAL:3,CKMB:3,CKMBINDEX:3,TROPONINI:3 in  the last 72 hours No components found with this basename: POCBNP:3 No results found for this basename: DDIMER:2 in the last 72 hours No results found for this basename: HGBA1C:2 in the last 72 hours  Basename 01/25/12 0606  CHOL 90  HDL --  LDLCALC --  TRIG 152*  CHOLHDL --  LDLDIRECT --   No results found for this basename: TSH,T4TOTAL,FREET3,T3FREE,THYROIDAB in the last 72 hours No results found for this basename: VITAMINB12:2,FOLATE:2,FERRITIN:2,TIBC:2,IRON:2,RETICCTPCT:2 in the last 72 hours  Micro Results: Recent Results (from the past 240 hour(s))  ANAEROBIC CULTURE     Status: Normal   Collection Time   01/19/12  2:00 PM      Component Value Range Status Comment   Specimen Description PERITONEAL CAVITY   Final    Special Requests Normal   Final    Gram Stain     Final    Value: FEW WBC PRESENT,BOTH PMN AND MONONUCLEAR     NO ORGANISMS SEEN   Culture NO ANAEROBES ISOLATED   Final    Report Status 01/24/2012 FINAL   Final   BODY FLUID CULTURE     Status: Normal   Collection Time   01/19/12  2:00 PM      Component Value Range Status Comment   Specimen  Description FLUID PERITONEAL CAVITY   Final    Special Requests NONE   Final    Gram Stain     Final    Value: FEW WBC PRESENT,BOTH PMN AND MONONUCLEAR     NO ORGANISMS SEEN   Culture NO GROWTH 3 DAYS   Final    Report Status 01/23/2012 FINAL   Final   CLOSTRIDIUM DIFFICILE BY PCR     Status: Normal   Collection Time   01/24/12 10:05 AM      Component Value Range Status Comment   C difficile by pcr NEGATIVE  NEGATIVE Final     Studies/Results:     Medications: I have reviewed the patient's current medications. Scheduled Meds:    . acetaminophen  1,000 mg Oral TID  . acidophilus  1 capsule Oral Daily  . antiseptic oral rinse  15 mL Mouth Rinse QID  . chlorhexidine  15 mL Mouth Rinse BID  . enoxaparin (LOVENOX) injection  40 mg Subcutaneous Q24H  . feeding supplement  237 mL Oral BID BM  . folic acid  1 mg  Oral Daily  . furosemide  20 mg Oral Daily  . insulin aspart  2-6 Units Subcutaneous TID AC & HS  . lip balm  1 application Topical BID  . LORazepam  1 mg Intravenous Q8H  . multivitamin with minerals  1 tablet Oral Daily  . octreotide  100 mcg Subcutaneous Q8H  . pantoprazole (PROTONIX) IV  40 mg Intravenous Q12H  . piperacillin-tazobactam (ZOSYN)  IV  3.375 g Intravenous Q8H  . DISCONTD: folic acid  1 mg Intravenous Daily  . DISCONTD: insulin aspart  2-6 Units Subcutaneous Q6H   Continuous Infusions:    . sodium chloride 20 mL/hr at 01/25/12 1639  . TPN (CLINIMIX) +/- additives     And  . fat emulsion    . TPN (CLINIMIX) +/- additives 80 mL/hr at 01/24/12 1718   PRN Meds:.acetaminophen, diphenhydrAMINE, HYDROmorphone (DILAUDID) injection, magic mouthwash, menthol-cetylpyridinium, ondansetron (ZOFRAN) IV, oxyCODONE, polyvinyl alcohol, sodium chloride Assessment/Plan: Patient Active Hospital Problem List:  Ruptured pancreatic pseudocyst with peritonitis:  - patient has had a very complicated course. Currently she is on day #20 of Zosyn. Flagyl discontinued yesterday. The duration of antibiotic treatment will be determined by Gen. surgery and is unspecified at this time. -Patient had a new area of collection in the abdomen and had a JP drain placed by interventional radiology yesterday morning which has had about 30 cc of fluid out. -Nutrition management per surgery   Anemia: -s/p transfusion of one unit packed blood cells on 9/28 -Hemoglobin drifted down to 7.2 today. Patient currently receiving blood transfusion per surgery.  -We'll continue to monitor     Gastric ulcer: -Patient status post EGD which showed gastric ulcer.  -Continue twice a day PPI treatment   C. difficile colitis: -Patient has been fully treated with more than 14 days of Flagyl. -Repeat C. difficile negative   Nutrition:  -Patient continues on TNA per surgery. Blood sugars associated with TNA use  well-controlled at this time.    I've spoken with Dr. Ezzard Standing about taking the patient over to the surgery service as her total care is being directed by the surgeons and the internal medicine services and nothing to the patient's management at this time.   LOS: 30 days

## 2012-01-26 LAB — GLUCOSE, CAPILLARY
Glucose-Capillary: 122 mg/dL — ABNORMAL HIGH (ref 70–99)
Glucose-Capillary: 124 mg/dL — ABNORMAL HIGH (ref 70–99)
Glucose-Capillary: 148 mg/dL — ABNORMAL HIGH (ref 70–99)
Glucose-Capillary: 132 mg/dL — ABNORMAL HIGH (ref 70–99)

## 2012-01-26 LAB — BASIC METABOLIC PANEL
BUN: 12 mg/dL (ref 6–23)
CO2: 25 mEq/L (ref 19–32)
Chloride: 98 mEq/L (ref 96–112)
Creatinine, Ser: 0.49 mg/dL — ABNORMAL LOW (ref 0.50–1.10)
Glucose, Bld: 118 mg/dL — ABNORMAL HIGH (ref 70–99)
Potassium: 3.8 mEq/L (ref 3.5–5.1)

## 2012-01-26 MED ORDER — CLINIMIX E/DEXTROSE (5/15) 5 % IV SOLN
INTRAVENOUS | Status: AC
  Administered 2012-01-26: 18:00:00 via INTRAVENOUS
  Filled 2012-01-26: qty 2000

## 2012-01-26 MED ORDER — PANTOPRAZOLE SODIUM 40 MG PO TBEC
40.0000 mg | DELAYED_RELEASE_TABLET | Freq: Every day | ORAL | Status: DC
  Administered 2012-01-26 – 2012-02-09 (×15): 40 mg via ORAL
  Filled 2012-01-26 (×15): qty 1

## 2012-01-26 MED ORDER — BOOST / RESOURCE BREEZE PO LIQD
1.0000 | Freq: Three times a day (TID) | ORAL | Status: DC
  Administered 2012-01-26 – 2012-01-27 (×3): 1 via ORAL
  Filled 2012-01-26 (×9): qty 1

## 2012-01-26 MED ORDER — LORAZEPAM 1 MG PO TABS: 1.0000 mg | ORAL_TABLET | Freq: Two times a day (BID) | ORAL | Status: DC

## 2012-01-26 MED ORDER — LORAZEPAM 1 MG PO TABS
1.0000 mg | ORAL_TABLET | Freq: Three times a day (TID) | ORAL | Status: DC
  Administered 2012-01-26 – 2012-02-09 (×42): 1 mg via ORAL
  Filled 2012-01-26 (×42): qty 1

## 2012-01-26 NOTE — Progress Notes (Signed)
Patient ID: [name], female   DOB: 02-Oct-1955, 56 y.o.   MRN: [id number] 20 Days Post-Op  Subjective: Pt feels ok this morning.  Didn't like ensure, but tolerated some of the full liquids.  Still not eating much  Objective: Vital signs in last 24 hours: Temp:  [98 F (36.7 C)-98.6 F (37 C)] 98.6 F (37 C) (10/08 0526) Pulse Rate:  [75-81] 81  (10/08 0526) Resp:  [18-20] 18  (10/08 0526) BP: (100-111)/(55-66) 111/55 mmHg (10/08 0526) SpO2:  [96 %-97 %] 96 % (10/08 0526) Last BM Date: 01/24/12  Intake/Output from previous day: 10/07 0701 - 10/08 0700 In: 2045 [P.O.:180; I.V.:230; IV Piggyback:50; TPN:1585] Out: 850 [Urine:400; Drains:450] Intake/Output this shift:    PE: Abd: stable, drains still with tan cloudy output.  Lab Results:   [name] 01/25/12 0540  WBC 9.8  HGB 10.1*  HCT 32.5*  PLT 448*   BMET  Basename 01/26/12 0500 01/25/12 0540  NA 132* 135  K 3.8 4.0  CL 98 102  CO2 25 26  GLUCOSE 118* 111*  BUN 12 11  CREATININE 0.49* 0.50  CALCIUM 8.5 8.4   PT/INR No results found for this basename: LABPROT:2,INR:2 in the last 72 hours CMP     Component Value Date/Time   NA 132* 01/26/2012 0500   K 3.8 01/26/2012 0500   CL 98 01/26/2012 0500   CO2 25 01/26/2012 0500   GLUCOSE 118* 01/26/2012 0500   BUN 12 01/26/2012 0500   CREATININE 0.49* 01/26/2012 0500   CALCIUM 8.5 01/26/2012 0500   PROT 7.1 01/25/2012 0540   ALBUMIN 1.9* 01/25/2012 0540   AST 15 01/25/2012 0540   ALT 6 01/25/2012 0540   ALKPHOS 81 01/25/2012 0540   BILITOT 0.4 01/25/2012 0540   GFRNONAA >90 01/26/2012 0500   GFRAA >90 01/26/2012 0500   Lipase     Component Value Date/Time   LIPASE 113* 01/11/2012 0540       Studies/Results: No results found.  Anti-infectives: Anti-infectives     Start     Dose/Rate Route Frequency Ordered Stop   01/23/12 1800  piperacillin-tazobactam (ZOSYN) IVPB 3.375 g       3.375 g 12.5 mL/hr over 240 Minutes Intravenous Every 8 hours 01/23/12  1155 01/30/12 1759   01/03/12 1600   metroNIDAZOLE (FLAGYL) IVPB 500 mg  Status:  Discontinued        500 mg 100 mL/hr over 60 Minutes Intravenous Every 8 hours 01/03/12 1525 01/23/12 1155   12/31/11 1100   vancomycin (VANCOCIN) 750 mg in sodium chloride 0.9 % 150 mL IVPB  Status:  Discontinued        750 mg 150 mL/hr over 60 Minutes Intravenous Every 12 hours 12/31/11 0958 01/03/12 0849   12/30/11 1000   piperacillin-tazobactam (ZOSYN) IVPB 3.375 g  Status:  Discontinued        3.375 g 12.5 mL/hr over 240 Minutes Intravenous Every 8 hours 12/30/11 0901 01/23/12 1155           Assessment/Plan  1. S/p drainage of ruptured pancreatic pseudocyst 2. Intra-abdominal fluid collection, s/p perc drain 3. PCM/TNA 4. Anxiety  Plan: 1. Cont TNA for now as patient still not taking in enough PO calories to stop this yet.  Will get a calorie count and change ensure to resource as patient doesn't like ensure 2. Will order a CT scan for tomorrow to relook at this abscess in abdomen. 3. Once patient eating and off TNA will anticipate dc to  SNF.   LOS: 31 days    OSBORNE,KELLY E 01/26/2012, 8:56 AM Pager: [phone]  PO intake is modest.  Complains of pain in the epigastrium.  Needs to ambulate more.  For CT scan tomorrow to check drainage.  Ovidio Kin, MD, [facility] Surgery Pager: [phone] Office phone:  [phone]

## 2012-01-26 NOTE — Progress Notes (Signed)
20 Days Post-Op  Subjective: Pt doing fair; still has intermittent abd pain; no nausea/vomiting; tolerates smaller better than larger portions of liquids/yogurt/jello ; slept well  Objective: Vital signs in last 24 hours: Temp:  [98 F (36.7 C)-98.6 F (37 C)] 98.6 F (37 C) (10/08 0526) Pulse Rate:  [75-81] 81  (10/08 0526) Resp:  [18-20] 18  (10/08 0526) BP: (100-111)/(55-66) 111/55 mmHg (10/08 0526) SpO2:  [96 %-97 %] 96 % (10/08 0526) Last BM Date: 01/24/12  Intake/Output from previous day: 10/07 0701 - 10/08 0700 In: 2045 [P.O.:180; I.V.:230; IV Piggyback:50; TPN:1585] Out: 850 [Urine:400; Drains:450] Intake/Output this shift:    Rt abd drain intact, output about 30 cc's cloudy beige fluid in bag today; perit fluid cx's -neg  Lab Results:   [facility] 01/25/12 0540  WBC 9.8  HGB 10.1*  HCT 32.5*  PLT 448*   BMET  Basename 01/26/12 0500 01/25/12 0540  NA 132* 135  K 3.8 4.0  CL 98 102  CO2 25 26  GLUCOSE 118* 111*  BUN 12 11  CREATININE 0.49* 0.50  CALCIUM 8.5 8.4   PT/INR No results found for this basename: LABPROT:2,INR:2 in the last 72 hours ABG No results found for this basename: PHART:2,PCO2:2,PO2:2,HCO3:2 in the last 72 hours Recent Results (from the past 240 hour(s))  ANAEROBIC CULTURE     Status: Normal   Collection Time   01/19/12  2:00 PM      Component Value Range Status Comment   Specimen Description PERITONEAL CAVITY   Final    Special Requests Normal   Final    Gram Stain     Final    Value: FEW WBC PRESENT,BOTH PMN AND MONONUCLEAR     NO ORGANISMS SEEN   Culture NO ANAEROBES ISOLATED   Final    Report Status 01/24/2012 FINAL   Final   BODY FLUID CULTURE     Status: Normal   Collection Time   01/19/12  2:00 PM      Component Value Range Status Comment   Specimen Description FLUID PERITONEAL CAVITY   Final    Special Requests NONE   Final    Gram Stain     Final    Value: FEW WBC PRESENT,BOTH PMN AND MONONUCLEAR     NO ORGANISMS SEEN     Culture NO GROWTH 3 DAYS   Final    Report Status 01/23/2012 FINAL   Final   CLOSTRIDIUM DIFFICILE BY PCR     Status: Normal   Collection Time   01/24/12 10:05 AM      Component Value Range Status Comment   C difficile by pcr NEGATIVE  NEGATIVE Final     Studies/Results: No results found.  Anti-infectives: Anti-infectives     Start     Dose/Rate Route Frequency Ordered Stop   01/23/12 1800   piperacillin-tazobactam (ZOSYN) IVPB 3.375 g        3.375 g 12.5 mL/hr over 240 Minutes Intravenous Every 8 hours 01/23/12 1155 01/30/12 1759   01/03/12 1600   metroNIDAZOLE (FLAGYL) IVPB 500 mg  Status:  Discontinued        500 mg 100 mL/hr over 60 Minutes Intravenous Every 8 hours 01/03/12 1525 01/23/12 1155   12/31/11 1100   vancomycin (VANCOCIN) 750 mg in sodium chloride 0.9 % 150 mL IVPB  Status:  Discontinued        750 mg 150 mL/hr over 60 Minutes Intravenous Every 12 hours 12/31/11 0958 01/03/12 0849   12/30/11 1000  piperacillin-tazobactam (ZOSYN) IVPB 3.375 g  Status:  Discontinued        3.375 g 12.5 mL/hr over 240 Minutes Intravenous Every 8 hours 12/30/11 0901 01/23/12 1155          Assessment/Plan: s/p right peritoneal abscess drainage 10/1; check f/u CT on 10/9 to assess adequacy of drainage; ambulate   LOS: 31 days    Merlin Golden,D [facility] 01/26/2012

## 2012-01-26 NOTE — Progress Notes (Signed)
Physical Therapy Treatment Patient Details Name: [name] MRN: [id number] DOB: 03-Jul-1955 Today's Date: 01/26/2012 Time: 1125-1150 PT Time Calculation (min): 25 min  PT Assessment / Plan / Recommendation Comments on Treatment Session  continuing to progress with mobility. Will need continued rehab at SNF    Follow Up Recommendations  Post acute inpatient     Does the patient have the potential to tolerate intense rehabilitation  No, Recommend SNF  Barriers to Discharge        Equipment Recommendations  Rolling walker with 5" wheels;3 in 1 bedside comode    Recommendations for Other Services    Frequency Min 3X/week   Plan Discharge plan remains appropriate    Precautions / Restrictions Precautions Precautions: Fall Precaution Comments: abdominal drains (3), wound vac Restrictions Weight Bearing Restrictions: No   Pertinent Vitals/Pain Abdomen 7/10    Mobility  Bed Mobility Bed Mobility: Supine to Sit Supine to Sit: HOB elevated;With rails;4: Min guard Details for Bed Mobility Assistance: Assist to monitor lines, leads. Increased time to get trunk to upright position, but no assist needed.  Transfers Transfers: Sit to Stand;Stand to Sit Sit to Stand: 4: Min guard;From bed;With upper extremity assist Stand to Sit: 4: Min assist;To chair/3-in-1;With upper extremity assist Details for Transfer Assistance: Assist to control descent. VCs safety, hand placement Ambulation/Gait Ambulation/Gait Assistance: 4: Min guard Ambulation Distance (Feet): 115 Feet Assistive device: Rolling walker Ambulation/Gait Assistance Details: VCs safety. Slow gait speed. 1 standing rest break after ~60 feet. Dyspnea 2/4 with ambulation.  Gait Pattern: Decreased stride length;Step-through pattern    Exercises General Exercises - Lower Extremity-12 reps each Ankle Circles/Pumps: AROM;Both;Seated Quad Sets: AROM;Both;Seated Long Arc Quad: AROM;Both;Seated Hip ABduction/ADduction:  AROM;Both;Seated   PT Diagnosis:    PT Problem List:   PT Treatment Interventions:     PT Goals Acute Rehab PT Goals Pt will go Supine/Side to Sit: with supervision PT Goal: Supine/Side to Sit - Progress: Progressing toward goal Pt will go Sit to Stand: with supervision PT Goal: Sit to Stand - Progress: Progressing toward goal Pt will go Stand to Sit: with supervision PT Goal: Stand to Sit - Progress: Progressing toward goal Pt will Ambulate: >150 feet;with supervision;with least restrictive assistive device PT Goal: Ambulate - Progress: Updated due to goal met  Visit Information  Last PT Received On: 01/26/12 Assistance Needed: +1    Subjective Data  Subjective: "I'm getting better. It's just the pain...." Patient Stated Goal: Less pain. Continue to get better   Cognition  Overall Cognitive Status: Appears within functional limits for tasks assessed/performed Arousal/Alertness: Awake/alert Orientation Level: Oriented X4 / Intact Behavior During Session: [facility] for tasks performed    Balance     End of Session PT - End of Session Activity Tolerance: Patient limited by pain;Patient limited by fatigue Patient left: in chair;with call bell/phone within reach   GP     Rebeca Alert [facility] 01/26/2012, 12:09 PM [phone]

## 2012-01-26 NOTE — Progress Notes (Signed)
PARENTERAL NUTRITION CONSULT NOTE - FOLLOW UP  Pharmacy Consult for TNA Indication: Severe Pancreatitis  Allergies  Allergen Reactions  . Ciprofloxacin Nausea Only    REACTION: nausea   Patient Measurements: Height: 5\' 6"  (167.6 cm) Weight: 166 lb 10.7 oz (75.6 kg) IBW/kg (Calculated) : 59.3   Vital Signs: Temp: 98.6 F (37 C) (10/08 0526) Temp src: Oral (10/08 0526) BP: 111/55 mmHg (10/08 0526) Pulse Rate: 81  (10/08 0526) Intake/Output from previous day: 10/07 0701 - 10/08 0700 In: 2045 [P.O.:180; I.V.:230; IV Piggyback:50; TPN:1585] Out: 850 [Urine:400; Drains:450] Intake/Output from this shift:    Labs:  [facility] 01/25/12 0540  WBC 9.8  HGB 10.1*  HCT 32.5*  PLT 448*  APTT --  INR --    Basename 01/26/12 0500 01/25/12 0606 01/25/12 0540  NA 132* -- 135  K 3.8 -- 4.0  CL 98 -- 102  CO2 25 -- 26  GLUCOSE 118* -- 111*  BUN 12 -- 11  CREATININE 0.49* -- 0.50  LABCREA -- -- --  CREAT24HRUR -- -- --  CALCIUM 8.5 -- 8.4  MG -- -- 1.9  PHOS -- -- 3.5  PROT -- -- 7.1  ALBUMIN -- -- 1.9*  AST -- -- 15  ALT -- -- 6  ALKPHOS -- -- 81  BILITOT -- -- 0.4  BILIDIR -- -- --  IBILI -- -- --  PREALBUMIN -- -- 9.9*  TRIG -- 152* --  CHOLHDL -- -- --  CHOL -- 90 --  Corrected Calcium = 10 Estimated Creatinine Clearance: 81.6 ml/min (by C-G formula based on Cr of 0.49).    Basename 01/26/12 0748 01/26/12 0617 01/25/12 2233  GLUCAP 148* 124* 122*   Insulin Requirements in the past 24 hours:  CBGs < 150, required 4 units of SSI last 24h.  Nutritional Goals:  RD recs 9/16: 1762 kcal, 86-95 grams protein Reestimated s/p extubation 9/30: 1475-1775 kcal, 94-110 grams protein Clinimix 5/15 @ 60ml/hr + Lipid 20% MWF will provide 96 g protein/day, 1843 kCal on lipid days, 1363 Kcal on non-lipid days for an average of 1569 Kcal per day/week.  Current nutrition:  Diet: Full Liquid (10/7), Ensure. mIVF: NS @ KVO Clinimix E 5/15 @ 80 ml/hr +  IVFE 20% @ 10 ml/hr on  MWF  Assessment:  [name] with h/o recurrent pancreatitis and ETOH abuse, s/p ERCP with sphincterotomy on 10/06/2011 here this admit with acute pancreatitis, developed ileus. Rapid response called 9/12 for respiratory decline and lethargy - pt transfer to ICU and CCM consulted. Intubated 9/12. 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   Pt underwent exploratory laparotomy with debridement of necrotic pancreas and drainage of infected, ruptured pseudocyst 9/15. TNA started 9/16.  9/30 CT abd = extensive loculated fluid in both sides of the abdomen consistent with a pseudocyst.  Additional drained placed by IR 10/1, 10/3.   Keeping on FL diet for now. Doesn't like Ensure, switching to Resource. Starting calorie count.    Other medication issues: Octreotide added 9/25, is this still necessary?  Labs: (10/4) Renal: Scr wnl/stable Hepatic fxn: AST/ALT improved to wnl. Electrolytes: Na remains low. Pre-Alb: pending. 2.3 (9/17), 12.2 (9/23), 11.4 (9/30), 9.9(10/7). TG/Cholesterol: TGs ok.  CBGs: at goal, requiring minimal insulin   Plan:    Continue clinimix to E 5/15 at 80 ml/hr.  IV fat emulsion on MWF only due to ongoing shortage.  Tolerating oral MVI.  F/u tolerance of diet for  tapering of TNA soon.  MD: consider changing the following to oral route: Protonix, scheduled Ativan.  TNA labs Monday/Thursdays.  Charolotte Eke, PharmD, pager [phone]. 01/26/2012,7:59 AM.

## 2012-01-26 NOTE — Progress Notes (Signed)
Calorie Count Note  48 hour calorie count ordered.  Diet: Full liquid  Supplements: Resource Breeze TID   TPN: Clinimix E 5/15 @ 80 ml/hr. Lipids (20% IVFE @ 10 ml/hr), multivitamins, and trace elements are provided 3 times weekly (MWF) due to national backorder. Provides 1569 kcal and 96 grams protein daily (based on weekly average). Meets 88% minimum estimated kcal and 87% minimum estimated protein needs.  - Noted pt did not like Ensure and was switched to [employer]. Attempted to meet with pt, however pt was crying and stated she was in pain - notified RN. No meal tickets recorded in calorie count for RD to analyze. No documented meal intake today per nursing. Will continue to monitor and analyze calorie count results.   Levon Hedger MS, RD, LDN [phone] Pager [phone] After Hours Pager

## 2012-01-27 ENCOUNTER — Other Ambulatory Visit (HOSPITAL_COMMUNITY): Payer: Self-pay

## 2012-01-27 ENCOUNTER — Inpatient Hospital Stay (HOSPITAL_COMMUNITY): Payer: MEDICAID

## 2012-01-27 LAB — GLUCOSE, CAPILLARY: Glucose-Capillary: 113 mg/dL — ABNORMAL HIGH (ref 70–99)

## 2012-01-27 MED ORDER — ENOXAPARIN SODIUM 40 MG/0.4ML ~~LOC~~ SOLN
40.0000 mg | SUBCUTANEOUS | Status: DC
  Administered 2012-01-27 – 2012-02-08 (×13): 40 mg via SUBCUTANEOUS
  Filled 2012-01-27 (×14): qty 0.4

## 2012-01-27 MED ORDER — IOHEXOL 300 MG/ML  SOLN
100.0000 mL | Freq: Once | INTRAMUSCULAR | Status: AC | PRN
  Administered 2012-01-27: 100 mL via INTRAVENOUS

## 2012-01-27 MED ORDER — FAT EMULSION 20 % IV EMUL
250.0000 mL | INTRAVENOUS | Status: AC
  Administered 2012-01-27: 250 mL via INTRAVENOUS
  Filled 2012-01-27: qty 250

## 2012-01-27 MED ORDER — CLINIMIX E/DEXTROSE (5/15) 5 % IV SOLN
INTRAVENOUS | Status: AC
  Filled 2012-01-27: qty 2000

## 2012-01-27 NOTE — Progress Notes (Signed)
As a courtesy to the Surgery service, this is a summary of the patient's hospital course up to this point.The patient was not seen or evaluated by Internal Medicine today but was transferred to the Surgical service.  [name] MRN: [id number] DOB/AGE: 1955-11-26 56 y.o.  Admit date: 12/26/2011 Discharge date: 01/27/2012  Primary Care Physician:  Thayer Headings, MD   Discharge Diagnoses:   Patient Active Problem List  Diagnosis  . DEPRESSION  . ESOPHAGEAL MOTILITY DISORDER  . HIATAL HERNIA  . FATTY LIVER DISEASE  . CHEST PAIN, NON-CARDIAC  . DIARRHEA  . ALCOHOL ABUSE, HX OF  . Alcohol abuse  . Pancreatitis caused by a combination of alcoholism, obstruction of the common bile duct by blood clots and a small stone, and extrinsic compression of the common bile duct by pancreatic pseudocyst  . Elevated liver function tests  . GERD (gastroesophageal reflux disease)  . Delirium tremens  . UTI (lower urinary tract infection)  . Pseudocyst of pancreas  . Acute blood loss anemia  . Depression  . Fatty liver  . Acute pancreatitis  . Epigastric abdominal pain  . Leukocytosis  . Hyponatremia  . Acute respiratory failure  . Shock  . ARDS (adult respiratory distress syndrome)  . C. difficile colitis  . S/P laparotomy  . Gastric ulcer  . Physical deconditioning  . Anemia  . Protein-calorie malnutrition, moderate          OTHER PROCEDURES: Events Since Admission:  9/12 RRT and subsequent transfer to the ICU.  9/14 TFs held due to firm abdomen & sub glottic suctioning, WC higher  9/14 CT abd: increased ascites, Persistent edema/inflammation surrounding the head of the pancreas, with an approximate 1.8 x 2.6 cm pseudocyst at the junction of the head and body. Developing pseudocyst in the pancreatic head measuring approximately 1.3 x 2.6 cm, Large hiatal hernia.  9/14 doppler UE neg for DVT  9/15 1.5 L paracentesis - green brown fluid  9/15 Exploratory laparotomy with  debridement of necrotic pancreas and drainage of the upper abdomen  9/18 EGD (Ganem) - 10-12 mm ulcer in the cardia of the stomach, protonix gtt, removed panda  9/20 CT abd: Decrease in moderate abdominal and pelvic ascites with intraperitoneal drainage catheters now seen in place. Mild increase in small bilateral pleural effusions. No significant change in diffuse body wall edema. Persistent small pseudocyst in the area the pancreatic head, which is decreased in size since earlier exam  9/23 extubated  9/24 dc fentanyl and start dilaudid.  9/24 change to SDU status.  9/26 transferred to [facility]  10/1 Additional Drain Placed by IR      BRIEF ADMITTING H & P: 55 yowf  H/O ERCP with sphincterotomy on 10/06/2011. She was adm [facility] via [facility] on 09/07 with acute pancreatitis. On 9/12 RRT was called for respiratory decline and lethargy, subsequently required mechanical ventilation & Exploratory laparotomy 9/15 with debridement of necrotic pancreas and drainage   Hospital Course:  Present on Admission:  Ruptured pancreatic pseudocyst with peritonitis  -s/p Exploratory laparotomy with debridement of necrotic pancreas and drainage of the upper abdomen 01/03/12 Dr. Wenda Low.  -Post op drainage from pseudocyst  -Post op VDRF on Vent, extubated 01/11/12  -Completed  15 days of flagyl   -Currently on day 27 of  Zosyn  - continue nutrition mgmt per surgery-TNA - pt tolerating  Clears liquids - pain meds IV (mgmt per CCS)  - CT abdomen 9/30: new collection in abdomen -Pt had new  drain placemed  By IR on 10/1  Anemia  - s/p 3 unit PRBC 9/28, 10/4 - following CBC  - Hg holding stable about 9.5-10 after transfusions   Gastric Ulcer/GERD -EGD showed Gastric ulcer - continue BID PPI treatment   Deconditioning/Weakness/Immobility  - CIR consult appreciated, pt not a  candidate for inpatient rehab.  - Plans for SNF at time of D/C - CCS team is increasing ambulation  - SCDs for DVT prophylaxis  -  encouraged more ambulation   C. Diff colitis  - completed 15 days of Flagyl  - repeat c.diff test was negative isolation discontinued.   Septic Shock  - resolved now   Chronic Narcotic Dependence  - IV dilaudid for pain  - pain mgmt per surgery   ARDS / Acute Resp Failure  - pt off vent since 9/23  -monitoring closely post extubation  -now without oxygen requirements  Hx of pancreatitis, and alcohol abuse  -See above -Counseled against further ETOH use.  Hyponatremia  - volume overloaded  - monitoring closely  - BMP in AM  -improved   Elevated liver function tests  - LFTs have returned to normal   Hiatal Hernia  9/18 EGD (Ganem) - 10-12 mm ulcer in the cardia of the stomach, protonix gtt, removed panda    Disposition and Follow-up:  SNF at time of discharge   Bill- No Charge for this summary note.  Signed: Wyndham Santilli A. 01/27/2012, 1:57 PM

## 2012-01-27 NOTE — Progress Notes (Signed)
Calorie Count Note  Intervention: Encouraged continued excellent intake. Encouraged increased Resource Breeze intake for additional protein. Will monitor.   48 hour calorie count ordered. Nothing recorded in the calorie count envelope. RN states pt has not eaten anything all day r/t having CT scan, however RN was in the process of giving pt a [employer]. Pt was trying to remember what she had yesterday and how much of her meals she ate. Pt states she ate 50% of her meals and drank 100% of 1 Ensure Complete, which made her sick. Noted pt now on regular diet with plans to order solid foods at dinner.   Diet: Regular  Supplements: Resource Breeze TID  10/8 Breakfast: 127 calories, 4g protein Lunch: 183 calories, 1g protein Dinner: 1440 calories, 30g protein (pt ordered 2 dinner trays) Supplements: 350 calories, 13g protein  Total intake: 2100 kcal (118% of minimum estimated needs)  48 protein (44% of minimum estimated needs)  Nutrition Dx: Inadequate oral intake - improving   Goal: 1. Diet advancement - met, pt on regular diet 2. TPN to meet >90% of estimated energy needs - met as closely as possible using premixed formula  3. Promote weight maintenance - no new weights since 9/24  TPN: Clinimix E 5/15 @ 80 ml/hr. Lipids (20% IVFE @ 10 ml/hr), multivitamins, and trace elements are provided 3 times weekly (MWF) due to national backorder. Provides 1569 kcal and 96 grams protein daily (based on weekly average). Meets 88% minimum estimated kcal and 87% minimum estimated protein needs.  Levon Hedger MS, RD, LDN [phone] Pager [phone] After Hours Pager

## 2012-01-27 NOTE — Progress Notes (Signed)
Patient ID: [name], female   DOB: [birth year], 56 y.o.   MRN: [id number] 21 Days Post-Op  Subjective: Pt having some pain this morning.  Ate some yogurt, jello, juices, italian ice, etc yesterday.  Tolerated this well.  Unclear how much she actually ate.  Objective: Vital signs in last 24 hours: Temp:  [98.3 F (36.8 C)-98.6 F (37 C)] 98.6 F (37 C) (10/09 0645) Pulse Rate:  [81-88] 88  (10/09 0645) Resp:  [16-20] 20  (10/09 0645) BP: (105-132)/(55-75) 109/55 mmHg (10/09 0645) SpO2:  [93 %-100 %] 100 % (10/09 0645) Last BM Date: 01/24/12  Intake/Output from previous day: 10/08 0701 - 10/09 0700 In: 1315.8 [P.O.:420; IV Piggyback:50; TPN:845.8] Out: 1570 [Urine:1500; Drains:70] Intake/Output this shift:    PE: Abd: soft, VAC in place, all drains still with tan cloudy output.  Lab Results:   [facility] 01/25/12 0540  WBC 9.8  HGB 10.1*  HCT 32.5*  PLT 448*   BMET  Basename 01/26/12 0500 01/25/12 0540  NA 132* 135  K 3.8 4.0  CL 98 102  CO2 25 26  GLUCOSE 118* 111*  BUN 12 11  CREATININE 0.49* 0.50  CALCIUM 8.5 8.4   PT/INR No results found for this basename: LABPROT:2,INR:2 in the last 72 hours CMP     Component Value Date/Time   NA 132* 01/26/2012 0500   K 3.8 01/26/2012 0500   CL 98 01/26/2012 0500   CO2 25 01/26/2012 0500   GLUCOSE 118* 01/26/2012 0500   BUN 12 01/26/2012 0500   CREATININE 0.49* 01/26/2012 0500   CALCIUM 8.5 01/26/2012 0500   PROT 7.1 01/25/2012 0540   ALBUMIN 1.9* 01/25/2012 0540   AST 15 01/25/2012 0540   ALT 6 01/25/2012 0540   ALKPHOS 81 01/25/2012 0540   BILITOT 0.4 01/25/2012 0540   GFRNONAA >90 01/26/2012 0500   GFRAA >90 01/26/2012 0500   Lipase     Component Value Date/Time   LIPASE 113* 01/11/2012 0540       Studies/Results: No results found.  Anti-infectives: Anti-infectives     Start     Dose/Rate Route Frequency Ordered Stop   01/23/12 1800  piperacillin-tazobactam (ZOSYN) IVPB 3.375 g       3.375 g 12.5  mL/hr over 240 Minutes Intravenous Every 8 hours 01/23/12 1155 01/30/12 1759   01/03/12 1600   metroNIDAZOLE (FLAGYL) IVPB 500 mg  Status:  Discontinued        500 mg 100 mL/hr over 60 Minutes Intravenous Every 8 hours 01/03/12 1525 01/23/12 1155   12/31/11 1100   vancomycin (VANCOCIN) 750 mg in sodium chloride 0.9 % 150 mL IVPB  Status:  Discontinued        750 mg 150 mL/hr over 60 Minutes Intravenous Every 12 hours 12/31/11 0958 01/03/12 0849   12/30/11 1000   piperacillin-tazobactam (ZOSYN) IVPB 3.375 g  Status:  Discontinued        3.375 g 12.5 mL/hr over 240 Minutes Intravenous Every 8 hours 12/30/11 0901 01/23/12 1155           Assessment/Plan  1. S/p ex lap with pancreatic pseudocyst drainage 2. Intra-abdominal abscess, s/p perc drain 3. PCM/TNA  PO intake interrupted today because of CT scan 4. DVT prophy - Lovenox  Plan: 1. Advance to solid diet today so patient can choose what she wants 2. Repeat CT scan today to re-evaluate this abscess drain. 3. Cont TNA, until calorie count completed to determine how well the patient is eating.  LOS: 32 days    OSBORNE,KELLY E 01/27/2012, 7:45 AM Pager: [phone]  CT today shows:  Improving thick-walled abdominal fluid collections.  1) Dominant collection in the mid abdomen has an indwelling pigtail  drainage catheter in satisfactory position. This catheter enters along the right lateral abdomen.  2.) Second drainage catheter enters to the left mid abdomen, courses through the left upper abdominal fluid component, but terminates  outside the fluid collection. Consider repositioning by withdrawing the catheter approximately 15 cm.  3.) Third drainage catheter enters the abdomen just to the right of midline and is without residual fluid collection. Consider complete removal. Will review with radiologist tomorrow to make plans about the drains. Appears to be in pretty good spirits.  Ovidio Kin, MD, [name]  Surgery Pager: [phone] Office phone:  [phone]

## 2012-01-27 NOTE — Progress Notes (Signed)
PT Cancellation Note  Patient Details Name: JORIE ZEE MRN: [id number] DOB: Feb 01, 1956   Cancelled Treatment:     Two attempts for PT tx session today. Pt awaiting procedure earlier today and pt experiencing nausea this afternoon. Requested PT check back another time. Will check back on tomorrow. Thanks.    Rebeca Alert [facility] 01/27/2012, 3:05 PM [phone]

## 2012-01-27 NOTE — Progress Notes (Signed)
Paged IV team to investigate PICC line IVAC states occluded on patient side

## 2012-01-27 NOTE — Progress Notes (Signed)
Occupational Therapy Treatment Patient Details Name: [name] MRN: [id number] DOB: 1955/07/26 Today's Date: 01/27/2012 Time: 4098-1191 OT Time Calculation (min): 18 min  OT Assessment / Plan / Recommendation Comments on Treatment Session Pt continues to be limited abdominal pain and fatigue. Recommend ST SNF at d/c.    Follow Up Recommendations  Skilled nursing facility    Barriers to Discharge       Equipment Recommendations  Rolling walker with 5" wheels;3 in 1 bedside comode    Recommendations for Other Services    Frequency Min 2X/week   Plan Discharge plan remains appropriate    Precautions / Restrictions Precautions Precautions: Fall Precaution Comments: 3 abdominal drains and 3 wound vacs. Restrictions Weight Bearing Restrictions: No   Pertinent Vitals/Pain Pt reported 8/10 abdominal pain. Repositioned for comfort.    ADL  Grooming: Performed;Wash/dry hands;Brushing hair Where Assessed - Grooming: Unsupported standing Upper Body Dressing: Performed;Set up Where Assessed - Upper Body Dressing: Unsupported sitting Lower Body Dressing: Performed;Set up (donned socks while sitting in bed) Where Assessed - Lower Body Dressing: Unsupported sitting Toilet Transfer: Performed;Min guard Acupuncturist: Comfort height toilet;Grab bars Toileting - Clothing Manipulation and Hygiene: Performed;Min guard Where Assessed - Engineer, mining and Hygiene: Sit to stand from 3-in-1 or toilet Equipment Used: Rolling walker ADL Comments: Pt continuing to c/o fatigue. Was able to ambulate to the bathroom with minguard A and RW.    OT Diagnosis:    OT Problem List:   OT Treatment Interventions:     OT Goals Acute Rehab OT Goals Time For Goal Achievement: 02/10/12 Potential to Achieve Goals: Good ADL Goals Pt Will Perform Grooming: with modified independence;Standing at sink ADL Goal: Grooming - Progress: Goal set today Pt Will Transfer to  Toilet: with modified independence;Comfort height toilet;3-in-1;Ambulation ADL Goal: Toilet Transfer - Progress: Goal set today Pt Will Perform Toileting - Clothing Manipulation: with modified independence;Sitting on 3-in-1 or toilet;Standing ADL Goal: Toileting - Clothing Manipulation - Progress: Updated due to goal met Pt Will Perform Toileting - Hygiene: with modified independence ADL Goal: Toileting - Hygiene - Progress: Updated due to goal met ADL Goal: Additional Goal #1 - Progress: Updated due to goal met  Visit Information  Last OT Received On: 01/27/12 Assistance Needed: +1    Subjective Data  Subjective: You can help me get to the bathroom.   Prior Functioning       Cognition  Overall Cognitive Status: Appears within functional limits for tasks assessed/performed Arousal/Alertness: Awake/alert Orientation Level: Appears intact for tasks assessed Behavior During Session: [facility] for tasks performed    Mobility  Shoulder Instructions Bed Mobility Sit to Supine: HOB elevated;With rail;4: Min guard Details for Bed Mobility Assistance: VCs for hand placement and some A for line management. Transfers Sit to Stand: 4: Min guard;With upper extremity assist;From bed;From toilet Stand to Sit: 4: Min guard;With upper extremity assist;To chair/3-in-1;To toilet Details for Transfer Assistance: Min cues for hand placement, safety.       Exercises      Balance Static Sitting Balance Static Sitting - Balance Support: No upper extremity supported;Feet supported Static Sitting - Level of Assistance: 6: Modified independent (Device/Increase time)   End of Session OT - End of Session Activity Tolerance: Patient limited by pain Patient left: in chair;with call bell/phone within reach  GO     Mercia Dowe A OTR/L [phone] 01/27/2012, 10:52 AM

## 2012-01-27 NOTE — Progress Notes (Signed)
PARENTERAL NUTRITION CONSULT NOTE - FOLLOW UP  Pharmacy Consult for TNA Indication: Severe Pancreatitis  Allergies  Allergen Reactions  . Ciprofloxacin Nausea Only    REACTION: nausea   Patient Measurements: Height: 5\' 6"  (167.6 cm) Weight: 166 lb 10.7 oz (75.6 kg) IBW/kg (Calculated) : 59.3   Vital Signs: Temp: 98.6 F (37 C) (10/09 0645) Temp src: Oral (10/09 0645) BP: 109/55 mmHg (10/09 0645) Pulse Rate: 88  (10/09 0645) Intake/Output from previous day: 10/08 0701 - 10/09 0700 In: 1315.8 [P.O.:420; IV Piggyback:50; TPN:845.8] Out: 1570 [Urine:1500; Drains:70] Intake/Output from this shift:    Labs:  [facility] 01/25/12 0540  WBC 9.8  HGB 10.1*  HCT 32.5*  PLT 448*  APTT --  INR --    Basename 01/26/12 0500 01/25/12 0606 01/25/12 0540  NA 132* -- 135  K 3.8 -- 4.0  CL 98 -- 102  CO2 25 -- 26  GLUCOSE 118* -- 111*  BUN 12 -- 11  CREATININE 0.49* -- 0.50  LABCREA -- -- --  CREAT24HRUR -- -- --  CALCIUM 8.5 -- 8.4  MG -- -- 1.9  PHOS -- -- 3.5  PROT -- -- 7.1  ALBUMIN -- -- 1.9*  AST -- -- 15  ALT -- -- 6  ALKPHOS -- -- 81  BILITOT -- -- 0.4  BILIDIR -- -- --  IBILI -- -- --  PREALBUMIN -- -- 9.9*  TRIG -- 152* --  CHOLHDL -- -- --  CHOL -- 90 --  Corrected Calcium = 10 Estimated Creatinine Clearance: 81.6 ml/min (by C-G formula based on Cr of 0.49).    Basename 01/27/12 0656 01/26/12 2116 01/26/12 1727  GLUCAP 116* 116* 132*   Insulin Requirements in the past 24 hours:  CBGs < 150, required 4 units of SSI last 24h.  Nutritional Goals:  RD recs 9/16: 1762 kcal, 86-95 grams protein Reestimated s/p extubation 9/30: 1475-1775 kcal, 94-110 grams protein Clinimix 5/15 @ 25ml/hr + Lipid 20% MWF will provide 96 g protein/day, 1843 kCal on lipid days, 1363 Kcal on non-lipid days for an average of 1569 Kcal per day/week.  Current nutrition:  Diet: to advanced to reg diet 10/9. mIVF: NS @ KVO Clinimix E 5/15 @ 80 ml/hr +  IVFE 20% @ 10 ml/hr on  MWF  Assessment:  [name] with h/o recurrent pancreatitis and ETOH abuse, s/p ERCP with sphincterotomy on 10/06/2011 here this admit with acute pancreatitis, developed ileus. Rapid response called 9/12 for respiratory decline and lethargy - pt transfer to ICU and CCM consulted. Intubated 9/12. 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   Pt underwent exploratory laparotomy with debridement of necrotic pancreas and drainage of infected, ruptured pseudocyst 9/15. TNA started 9/16.  9/30 CT abd = extensive loculated fluid in both sides of the abdomen consistent with a pseudocyst.  Additional drain placed by IR 10/1, 10/3.   To try solid diet today. Continuing calorie count.    Other medication issues: Octreotide added 9/25, is this still necessary? Is pancrealipase indicated?  Labs: No labs drawn today. Renal: Scr wnl/stable Hepatic fxn: AST/ALT improved to wnl. Electrolytes: Na remains low. Pre-Alb: pending. 2.3 (9/17), 12.2 (9/23), 11.4 (9/30), 9.9(10/7). TG/Cholesterol: TGs ok.  CBGs: at goal, requiring minimal insulin   Plan:    Continue Clinimix E 5/15 at 80 ml/hr.  IV fat emulsion on MWF only due to ongoing shortage.  Tolerating oral MVI.  F/u tolerance of diet for tapering  of TNA soon.  TNA labs Monday/Thursdays.  Charolotte Eke, PharmD, pager [phone]. 01/27/2012,7:58 AM.

## 2012-01-27 NOTE — Progress Notes (Signed)
21 Days Post-Op  Subjective: Pt c/o nausea after drinking boost supplement  Objective: Vital signs in last 24 hours: Temp:  [98.1 F (36.7 C)-98.6 F (37 C)] 98.3 F (36.8 C) (10/09 1435) Pulse Rate:  [81-88] 84  (10/09 1435) Resp:  [16-20] 18  (10/09 1435) BP: (105-132)/(55-75) 118/68 mmHg (10/09 1435) SpO2:  [93 %-100 %] 96 % (10/09 1435) Last BM Date: 01/24/12  Intake/Output from previous day: 10/08 0701 - 10/09 0700 In: 1315.8 [P.O.:420; IV Piggyback:50; TPN:845.8] Out: 1570 [Urine:1500; Drains:70] Intake/Output this shift: Total I/O In: -  Out: 600 [Urine:600]  Right peritoneal drain intact, output 20 cc's tan fluid, insertion site with mild erythema  Lab Results:   [facility] 01/25/12 0540  WBC 9.8  HGB 10.1*  HCT 32.5*  PLT 448*   BMET  Basename 01/26/12 0500 01/25/12 0540  NA 132* 135  K 3.8 4.0  CL 98 102  CO2 25 26  GLUCOSE 118* 111*  BUN 12 11  CREATININE 0.49* 0.50  CALCIUM 8.5 8.4   PT/INR No results found for this basename: LABPROT:2,INR:2 in the last 72 hours ABG No results found for this basename: PHART:2,PCO2:2,PO2:2,HCO3:2 in the last 72 hours Results for orders placed during the hospital encounter of 12/26/11  URINE CULTURE     Status: Normal   Collection Time   12/27/11  9:37 PM      Component Value Range Status Comment   Specimen Description URINE, RANDOM   Final    Special Requests NONE   Final    Culture  Setup Time 12/28/2011 09:31   Final    Colony Count 45,000 COLONIES/ML   Final    Culture     Final    Value: Multiple bacterial morphotypes present, none predominant. Suggest appropriate recollection if clinically indicated.   Report Status 12/29/2011 FINAL   Final   CULTURE, BLOOD (ROUTINE X 2)     Status: Normal   Collection Time   12/30/11  9:50 AM      Component Value Range Status Comment   Specimen Description BLOOD RIGHT ARM   Final    Special Requests BOTTLES DRAWN AEROBIC AND ANAEROBIC 5CC   Final    Culture  Setup Time  12/30/2011 14:02   Final    Culture NO GROWTH 5 DAYS   Final    Report Status 01/05/2012 FINAL   Final   CULTURE, BLOOD (ROUTINE X 2)     Status: Normal   Collection Time   12/30/11 10:00 AM      Component Value Range Status Comment   Specimen Description BLOOD LEFT ARM   Final    Special Requests BOTTLES DRAWN AEROBIC AND ANAEROBIC 5CC   Final    Culture  Setup Time 12/30/2011 14:02   Final    Culture NO GROWTH 5 DAYS   Final    Report Status 01/05/2012 FINAL   Final   MRSA PCR SCREENING     Status: Normal   Collection Time   12/31/11 11:27 AM      Component Value Range Status Comment   MRSA by PCR NEGATIVE  NEGATIVE Final   URINE CULTURE     Status: Normal   Collection Time   01/01/12  9:30 PM      Component Value Range Status Comment   Specimen Description URINE, CATHETERIZED   Final    Special Requests Normal   Final    Culture  Setup Time 01/02/2012 01:51   Final    Colony  Count NO GROWTH   Final    Culture NO GROWTH   Final    Report Status 01/03/2012 FINAL   Final   CULTURE, BLOOD (ROUTINE X 2)     Status: Normal   Collection Time   01/01/12 10:45 PM      Component Value Range Status Comment   Specimen Description LEFT ANTECUBITAL   Final    Special Requests BOTTLES DRAWN AEROBIC AND ANAEROBIC 3 CC EA   Final    Culture  Setup Time 01/02/2012 03:49   Final    Culture NO GROWTH 5 DAYS   Final    Report Status 01/08/2012 FINAL   Final   FUNGUS CULTURE, BLOOD     Status: Normal   Collection Time   01/01/12 10:45 PM      Component Value Range Status Comment   Specimen Description LEFT ANTECUBITAL   Final    Special Requests BOTTLES DRAWN AEROBIC ONLY 3 CC EA   Final    Culture NO GROWTH 7 DAYS   Final    Report Status 01/10/2012 FINAL   Final   CULTURE, BLOOD (ROUTINE X 2)     Status: Normal   Collection Time   01/01/12 10:55 PM      Component Value Range Status Comment   Specimen Description RIGHT ANTECUBITAL   Final    Special Requests BOTTLES DRAWN AEROBIC AND  ANAEROBIC 5 CC EA   Final    Culture  Setup Time 01/02/2012 03:49   Final    Culture NO GROWTH 5 DAYS   Final    Report Status 01/08/2012 FINAL   Final   CULTURE, RESPIRATORY     Status: Normal   Collection Time   01/01/12 11:35 PM      Component Value Range Status Comment   Specimen Description TRACHEAL ASPIRATE   Final    Special Requests Normal   Final    Gram Stain     Final    Value: RARE WBC PRESENT,BOTH PMN AND MONONUCLEAR     RARE SQUAMOUS EPITHELIAL CELLS PRESENT     RARE GRAM POSITIVE COCCI IN PAIRS   Culture Non-Pathogenic Oropharyngeal-type Flora Isolated.   Final    Report Status 01/04/2012 FINAL   Final   VRE CULTURE     Status: Normal   Collection Time   01/02/12  3:07 AM      Component Value Range Status Comment   Specimen Description RECTAL SWAB   Final    Special Requests Normal   Final    Culture NEGATIVE FOR VANCOMYCIN RESISTANT ENTEROCOCCI   Final    Report Status 01/06/2012 FINAL   Final   CLOSTRIDIUM DIFFICILE BY PCR     Status: Abnormal   Collection Time   01/02/12  4:54 PM      Component Value Range Status Comment   C difficile by pcr POSITIVE (*) NEGATIVE Final   BODY FLUID CULTURE     Status: Normal   Collection Time   01/03/12 10:00 AM      Component Value Range Status Comment   Specimen Description ASCITIC   Final    Special Requests NONE   Final    Gram Stain     Final    Value: NO WBC SEEN     NO ORGANISMS SEEN   Culture NO GROWTH 3 DAYS   Final    Report Status 01/06/2012 FINAL   Final   BODY FLUID CULTURE     Status:  Normal   Collection Time   01/03/12  1:51 PM      Component Value Range Status Comment   Specimen Description PERITONEAL   Final    Special Requests NONE   Final    Gram Stain     Final    Value: NO WBC SEEN     NO ORGANISMS SEEN   Culture NO GROWTH 3 DAYS   Final    Report Status 01/07/2012 FINAL   Final   ANAEROBIC CULTURE     Status: Normal   Collection Time   01/03/12  1:52 PM      Component Value Range Status Comment     Specimen Description PERITONEAL FLUID   Final    Special Requests NONE   Final    Gram Stain     Final    Value: NO WBC SEEN     NO ORGANISMS SEEN   Culture NO ANAEROBES ISOLATED   Final    Report Status 01/08/2012 FINAL   Final   CULTURE, BLOOD (ROUTINE X 2)     Status: Normal   Collection Time   01/04/12  2:55 PM      Component Value Range Status Comment   Specimen Description BLOOD LEFT HAND   Final    Special Requests BOTTLES DRAWN AEROBIC AND ANAEROBIC 5.5CC   Final    Culture  Setup Time 01/04/2012 23:02   Final    Culture NO GROWTH 5 DAYS   Final    Report Status 01/10/2012 FINAL   Final   CULTURE, BLOOD (ROUTINE X 2)     Status: Normal   Collection Time   01/04/12  3:00 PM      Component Value Range Status Comment   Specimen Description BLOOD LEFT HAND   Final    Special Requests BOTTLES DRAWN AEROBIC AND ANAEROBIC 5.5CC   Final    Culture  Setup Time 01/04/2012 23:29   Final    Culture NO GROWTH 5 DAYS   Final    Report Status 01/10/2012 FINAL   Final   CULTURE, RESPIRATORY     Status: Normal   Collection Time   01/04/12  3:37 PM      Component Value Range Status Comment   Specimen Description ENDOTRACHEAL   Final    Special Requests NONE   Final    Gram Stain     Final    Value: RARE WBC PRESENT, PREDOMINANTLY MONONUCLEAR     NO SQUAMOUS EPITHELIAL CELLS SEEN     NO ORGANISMS SEEN   Culture Non-Pathogenic Oropharyngeal-type Flora Isolated.   Final    Report Status 01/07/2012 FINAL   Final   URINE CULTURE     Status: Normal   Collection Time   01/04/12  4:59 PM      Component Value Range Status Comment   Specimen Description URINE, CATHETERIZED   Final    Special Requests zosyn,flagyl   Final    Culture  Setup Time 01/05/2012 02:21   Final    Colony Count NO GROWTH   Final    Culture NO GROWTH   Final    Report Status 01/05/2012 FINAL   Final   ANAEROBIC CULTURE     Status: Normal   Collection Time   01/19/12  2:00 PM      Component Value Range Status Comment    Specimen Description PERITONEAL CAVITY   Final    Special Requests Normal   Final    Gram Stain  Final    Value: FEW WBC PRESENT,BOTH PMN AND MONONUCLEAR     NO ORGANISMS SEEN   Culture NO ANAEROBES ISOLATED   Final    Report Status 01/24/2012 FINAL   Final   BODY FLUID CULTURE     Status: Normal   Collection Time   01/19/12  2:00 PM      Component Value Range Status Comment   Specimen Description FLUID PERITONEAL CAVITY   Final    Special Requests NONE   Final    Gram Stain     Final    Value: FEW WBC PRESENT,BOTH PMN AND MONONUCLEAR     NO ORGANISMS SEEN   Culture NO GROWTH 3 DAYS   Final    Report Status 01/23/2012 FINAL   Final   CLOSTRIDIUM DIFFICILE BY PCR     Status: Normal   Collection Time   01/24/12 10:05 AM      Component Value Range Status Comment   C difficile by pcr NEGATIVE  NEGATIVE Final     Studies/Results: Ct Abdomen Pelvis W Contrast  01/27/2012  *RADIOLOGY REPORT*  Clinical Data: Follow up abscess status post drain  CT ABDOMEN AND PELVIS WITH CONTRAST  Technique:  Multidetector CT imaging of the abdomen and pelvis was performed following the standard protocol during bolus administration of intravenous contrast.  Contrast: OMNIPAQUE IOHEXOL 300 MG/ML  SOLN  Comparison: 01/18/2012  Findings: Small right pleural effusion, decreased.  Small to moderate left pleural effusion, unchanged.  Basilar atelectasis in the bilateral lower lobes.  Liver, spleen, and right adrenal gland are within normal limits. Stable mild nodularity of the left adrenal gland (series 2/image 23).  Pancreas is notable for a hypoenhancing lesion in the pancreatic head measuring approximately 13 mm (series 2/image 33), grossly unchanged, possibly reflecting a small pseudocyst.  Surrounding peripancreatic fluid/stranding associated with the pancreatic head, improved.  Kidneys are within normal limits.  No hydronephrosis.  No evidence of bowel obstruction.  3.2 x 10.4 cm thick-walled fluid  collection in the mid abdomen (series 2/image 53), previously 6.2 x 15.6 cm.  New indwelling pigtail drainage catheter (when compared to prior diagnostic CT). Additional fluid tracks nearly along the right lateral abdominal wall/inferior liver (coronal image 24).  Associated 7.5 x 2.0 cm thick-walled fluid collection along the lateral aspect of the spleen (series 2/image 22), previously 11.0 x 2.7 cm.  A drainage catheter courses through this collection and terminates in the left anterior abdomen (series 2/image 17).  A third drainage catheter enters the right mid anterior abdominal wall and terminates in the right anterior abdomen (series 2/image 40).  No associated residual fluid collection.  Atherosclerotic calcifications of the abdominal aorta and branch vessels.  Scattered small abdominal/retroperitoneal lymph nodes which do not meet pathologic CT size criteria.  Uterus and bilateral ovaries are unremarkable.  Bladder is notable for nondependent gas (series 2/image 71), correlate for recent intervention.  IMPRESSION: Improving thick-walled abdominal fluid collections, as described above.  Dominant collection in the mid abdomen has an indwelling pigtail drainage catheter in satisfactory position.  This catheter enters along the right lateral abdomen.  Second drainage catheter enters to the left mid abdomen, courses through the left upper abdominal fluid component, but terminates outside the fluid collection.  Consider repositioning by withdrawing the catheter approximately 15 cm.  Third drainage catheter enters the abdomen just to the right of midline and is without residual fluid collection.  Consider complete removal.   Original Report Authenticated By: Charline Bills, M.D.  Anti-infectives: Anti-infectives     Start     Dose/Rate Route Frequency Ordered Stop   01/23/12 1800  piperacillin-tazobactam (ZOSYN) IVPB 3.375 g       3.375 g 12.5 mL/hr over 240 Minutes Intravenous Every 8 hours 01/23/12  1155 01/30/12 1759   01/03/12 1600   metroNIDAZOLE (FLAGYL) IVPB 500 mg  Status:  Discontinued        500 mg 100 mL/hr over 60 Minutes Intravenous Every 8 hours 01/03/12 1525 01/23/12 1155   12/31/11 1100   vancomycin (VANCOCIN) 750 mg in sodium chloride 0.9 % 150 mL IVPB  Status:  Discontinued        750 mg 150 mL/hr over 60 Minutes Intravenous Every 12 hours 12/31/11 0958 01/03/12 0849   12/30/11 1000   piperacillin-tazobactam (ZOSYN) IVPB 3.375 g  Status:  Discontinued        3.375 g 12.5 mL/hr over 240 Minutes Intravenous Every 8 hours 12/30/11 0901 01/23/12 1155          Assessment/Plan: s/p right peritoneal fluid coll drainage 10/1; cont IR placed drain as residual fluid remains on today's CT(see above report); other drain(s) management as per [facility]CS.   LOS: 32 days    Chavis Tessler,D [name] 01/27/2012

## 2012-01-28 LAB — COMPREHENSIVE METABOLIC PANEL
Albumin: 1.9 g/dL — ABNORMAL LOW (ref 3.5–5.2)
BUN: 11 mg/dL (ref 6–23)
CO2: 27 mEq/L (ref 19–32)
Chloride: 100 mEq/L (ref 96–112)
Creatinine, Ser: 0.51 mg/dL (ref 0.50–1.10)
GFR calc Af Amer: >90 mL/min (ref >90)
GFR calc non Af Amer: >90 mL/min (ref >90)
Glucose, Bld: 110 mg/dL — ABNORMAL HIGH (ref 70–99)
Total Bilirubin: 0.3 mg/dL (ref 0.3–1.2)

## 2012-01-28 LAB — GLUCOSE, CAPILLARY
Glucose-Capillary: 130 mg/dL — ABNORMAL HIGH (ref 70–99)
Glucose-Capillary: 99 mg/dL (ref 70–99)

## 2012-01-28 LAB — MAGNESIUM: Magnesium: 2 mg/dL (ref 1.5–2.5)

## 2012-01-28 LAB — PHOSPHORUS: Phosphorus: 4 mg/dL (ref 2.3–4.6)

## 2012-01-28 MED ORDER — CLINIMIX E/DEXTROSE (5/15) 5 % IV SOLN: INTRAVENOUS | Status: DC

## 2012-01-28 MED ORDER — HYDROCODONE-ACETAMINOPHEN 5-325 MG PO TABS
1.0000 | ORAL_TABLET | ORAL | Status: DC | PRN
  Administered 2012-01-28 – 2012-01-30 (×6): 2 via ORAL
  Filled 2012-01-28: qty 2
  Filled 2012-01-28: qty 1
  Filled 2012-01-28 (×6): qty 2

## 2012-01-28 MED ORDER — CLINIMIX E/DEXTROSE (5/15) 5 % IV SOLN
INTRAVENOUS | Status: DC
  Filled 2012-01-28: qty 2000

## 2012-01-28 MED ORDER — CLINIMIX E/DEXTROSE (5/15) 5 % IV SOLN
INTRAVENOUS | Status: AC
  Administered 2012-01-28: 18:00:00 via INTRAVENOUS
  Filled 2012-01-28: qty 1000

## 2012-01-28 NOTE — Progress Notes (Signed)
Physical Therapy Treatment Patient Details Name: [name] MRN: [id number] DOB: October 12, 1955 Today's Date: 01/28/2012 Time: 4098-1191 PT Time Calculation (min): 10 min  PT Assessment / Plan / Recommendation Comments on Treatment Session  Initiated standing exercises this session. Unable to ambulate due to IV pole low battery/needed charging immediately. Stand pivot x 2 without device. Continue to recommend SNF    Follow Up Recommendations  Post acute inpatient     Does the patient have the potential to tolerate intense rehabilitation  No, Recommend SNF  Barriers to Discharge        Equipment Recommendations  Rolling walker with 5" wheels;3 in 1 bedside comode    Recommendations for Other Services    Frequency Min 3X/week   Plan Discharge plan remains appropriate    Precautions / Restrictions     Pertinent Vitals/Pain     Mobility  Transfers Transfers: Stand Pivot Transfers Stand Pivot Transfers: 4: Min guard Details for Transfer Assistance: Stand pivot, bed <> BSC, no device.  Ambulation/Gait Ambulation/Gait Assistance Details: Unable to ambulate due to IV pole battery too low/needed recharging.     Exercises General Exercises - Lower Extremity-standing while holding onto RW Ankle Circles/Pumps: AROM;Both;10 reps;Seated Long Arc Quad: AROM;Both;10 reps;Seated Hip Flexion/Marching: AROM;Both;10 reps;Standing Heel Raises: AROM;Both;10 reps;Standing Mini-Sqauts: AROM;Both;10 reps;Standing Other Exercises Other Exercises: Knee flexion, 10 reps, both, standing   PT Diagnosis:    PT Problem List:   PT Treatment Interventions:     PT Goals Acute Rehab PT Goals Pt will go Supine/Side to Sit: with modified independence PT Goal: Supine/Side to Sit - Progress: Updated due to goal met Pt will go Sit to Supine/Side: with modified independence PT Goal: Sit to Supine/Side - Progress: Updated due to goal met Pt will go Sit to Stand: with modified independence PT  Goal: Sit to Stand - Progress: Updated due to goal met Pt will go Stand to Sit: with modified independence PT Goal: Stand to Sit - Progress: Updated due to goals met Pt will Transfer Bed to Chair/Chair to Bed: with modified independence PT Transfer Goal: Bed to Chair/Chair to Bed - Progress: Updated due to goal met Pt will Ambulate: >150 feet;with modified independence;with least restrictive assistive device PT Goal: Ambulate - Progress: Updated due to goal met  Visit Information  Last PT Received On: 01/28/12 Assistance Needed: +1    Subjective Data  Subjective: "I could go to the bathroom myself if it wasn't for all this stuff" Patient Stated Goal: Less pain   Cognition       Balance     End of Session PT - End of Session Activity Tolerance: Patient tolerated treatment well Patient left: in bed;with call bell/phone within reach   GP     Rebeca Alert [facility] 01/28/2012, 3:35 PM [phone]

## 2012-01-28 NOTE — Progress Notes (Signed)
Calorie Count Note  48 hour calorie count ordered, ends today  Diet: Regular  10/10 Breakfast: 150 calories, 8g protein Lunch: 140 calories, 2g protein (pt consumed 1 ice cream)  Total intake: 290 kcal (16% of minimum estimated needs)  10g protein (9% of minimum estimated needs)   Levon Hedger MS, RD, LDN [phone] Pager [phone] After Hours Pager

## 2012-01-28 NOTE — Progress Notes (Signed)
22 Days Post-Op  Subjective: Pt without new c/o; still has some nausea after eating; has had reglan in past prior to eating with decreased nausea per pt  Objective: Vital signs in last 24 hours: Temp:  [98.1 F (36.7 C)-99.8 F (37.7 C)] 99.6 F (37.6 C) (10/10 0558) Pulse Rate:  [79-86] 79  (10/10 0558) Resp:  [18-20] 20  (10/10 0558) BP: (112-121)/(55-72) 120/67 mmHg (10/10 0558) SpO2:  [92 %-96 %] 92 % (10/10 0217) Last BM Date: 01/27/12  Intake/Output from previous day: 10/09 0701 - 10/10 0700 In: 240 [P.O.:240] Out: 600 [Urine:600] Intake/Output this shift:    Rt lat perit drain intact, output about 25 cc's today; drain flushed with 5 cc's sterile NS without difficulty and return of same amt (reddish-beige fluid)  Lab Results:  No results found for this basename: WBC:2,HGB:2,HCT:2,PLT:2 in the last 72 hours BMET  Basename 01/28/12 0407 01/26/12 0500  NA 135 132*  K 3.8 3.8  CL 100 98  CO2 27 25  GLUCOSE 110* 118*  BUN 11 12  CREATININE 0.51 0.49*  CALCIUM 8.8 8.5   PT/INR No results found for this basename: LABPROT:2,INR:2 in the last 72 hours ABG No results found for this basename: PHART:2,PCO2:2,PO2:2,HCO3:2 in the last 72 hours Results for orders placed during the hospital encounter of 12/26/11  URINE CULTURE     Status: Normal   Collection Time   12/27/11  9:37 PM      Component Value Range Status Comment   Specimen Description URINE, RANDOM   Final    Special Requests NONE   Final    Culture  Setup Time 12/28/2011 09:31   Final    Colony Count 45,000 COLONIES/ML   Final    Culture     Final    Value: Multiple bacterial morphotypes present, none predominant. Suggest appropriate recollection if clinically indicated.   Report Status 12/29/2011 FINAL   Final   CULTURE, BLOOD (ROUTINE X 2)     Status: Normal   Collection Time   12/30/11  9:50 AM      Component Value Range Status Comment   Specimen Description BLOOD RIGHT ARM   Final    Special Requests  BOTTLES DRAWN AEROBIC AND ANAEROBIC 5CC   Final    Culture  Setup Time 12/30/2011 14:02   Final    Culture NO GROWTH 5 DAYS   Final    Report Status 01/05/2012 FINAL   Final   CULTURE, BLOOD (ROUTINE X 2)     Status: Normal   Collection Time   12/30/11 10:00 AM      Component Value Range Status Comment   Specimen Description BLOOD LEFT ARM   Final    Special Requests BOTTLES DRAWN AEROBIC AND ANAEROBIC 5CC   Final    Culture  Setup Time 12/30/2011 14:02   Final    Culture NO GROWTH 5 DAYS   Final    Report Status 01/05/2012 FINAL   Final   MRSA PCR SCREENING     Status: Normal   Collection Time   12/31/11 11:27 AM      Component Value Range Status Comment   MRSA by PCR NEGATIVE  NEGATIVE Final   URINE CULTURE     Status: Normal   Collection Time   01/01/12  9:30 PM      Component Value Range Status Comment   Specimen Description URINE, CATHETERIZED   Final    Special Requests Normal   Final    Culture  Setup Time 01/02/2012 01:51   Final    Colony Count NO GROWTH   Final    Culture NO GROWTH   Final    Report Status 01/03/2012 FINAL   Final   CULTURE, BLOOD (ROUTINE X 2)     Status: Normal   Collection Time   01/01/12 10:45 PM      Component Value Range Status Comment   Specimen Description LEFT ANTECUBITAL   Final    Special Requests BOTTLES DRAWN AEROBIC AND ANAEROBIC 3 CC EA   Final    Culture  Setup Time 01/02/2012 03:49   Final    Culture NO GROWTH 5 DAYS   Final    Report Status 01/08/2012 FINAL   Final   FUNGUS CULTURE, BLOOD     Status: Normal   Collection Time   01/01/12 10:45 PM      Component Value Range Status Comment   Specimen Description LEFT ANTECUBITAL   Final    Special Requests BOTTLES DRAWN AEROBIC ONLY 3 CC EA   Final    Culture NO GROWTH 7 DAYS   Final    Report Status 01/10/2012 FINAL   Final   CULTURE, BLOOD (ROUTINE X 2)     Status: Normal   Collection Time   01/01/12 10:55 PM      Component Value Range Status Comment   Specimen Description RIGHT  ANTECUBITAL   Final    Special Requests BOTTLES DRAWN AEROBIC AND ANAEROBIC 5 CC EA   Final    Culture  Setup Time 01/02/2012 03:49   Final    Culture NO GROWTH 5 DAYS   Final    Report Status 01/08/2012 FINAL   Final   CULTURE, RESPIRATORY     Status: Normal   Collection Time   01/01/12 11:35 PM      Component Value Range Status Comment   Specimen Description TRACHEAL ASPIRATE   Final    Special Requests Normal   Final    Gram Stain     Final    Value: RARE WBC PRESENT,BOTH PMN AND MONONUCLEAR     RARE SQUAMOUS EPITHELIAL CELLS PRESENT     RARE GRAM POSITIVE COCCI IN PAIRS   Culture Non-Pathogenic Oropharyngeal-type Flora Isolated.   Final    Report Status 01/04/2012 FINAL   Final   VRE CULTURE     Status: Normal   Collection Time   01/02/12  3:07 AM      Component Value Range Status Comment   Specimen Description RECTAL SWAB   Final    Special Requests Normal   Final    Culture NEGATIVE FOR VANCOMYCIN RESISTANT ENTEROCOCCI   Final    Report Status 01/06/2012 FINAL   Final   CLOSTRIDIUM DIFFICILE BY PCR     Status: Abnormal   Collection Time   01/02/12  4:54 PM      Component Value Range Status Comment   C difficile by pcr POSITIVE (*) NEGATIVE Final   BODY FLUID CULTURE     Status: Normal   Collection Time   01/03/12 10:00 AM      Component Value Range Status Comment   Specimen Description ASCITIC   Final    Special Requests NONE   Final    Gram Stain     Final    Value: NO WBC SEEN     NO ORGANISMS SEEN   Culture NO GROWTH 3 DAYS   Final    Report Status 01/06/2012 FINAL  Final   BODY FLUID CULTURE     Status: Normal   Collection Time   01/03/12  1:51 PM      Component Value Range Status Comment   Specimen Description PERITONEAL   Final    Special Requests NONE   Final    Gram Stain     Final    Value: NO WBC SEEN     NO ORGANISMS SEEN   Culture NO GROWTH 3 DAYS   Final    Report Status 01/07/2012 FINAL   Final   ANAEROBIC CULTURE     Status: Normal   Collection  Time   01/03/12  1:52 PM      Component Value Range Status Comment   Specimen Description PERITONEAL FLUID   Final    Special Requests NONE   Final    Gram Stain     Final    Value: NO WBC SEEN     NO ORGANISMS SEEN   Culture NO ANAEROBES ISOLATED   Final    Report Status 01/08/2012 FINAL   Final   CULTURE, BLOOD (ROUTINE X 2)     Status: Normal   Collection Time   01/04/12  2:55 PM      Component Value Range Status Comment   Specimen Description BLOOD LEFT HAND   Final    Special Requests BOTTLES DRAWN AEROBIC AND ANAEROBIC 5.5CC   Final    Culture  Setup Time 01/04/2012 23:02   Final    Culture NO GROWTH 5 DAYS   Final    Report Status 01/10/2012 FINAL   Final   CULTURE, BLOOD (ROUTINE X 2)     Status: Normal   Collection Time   01/04/12  3:00 PM      Component Value Range Status Comment   Specimen Description BLOOD LEFT HAND   Final    Special Requests BOTTLES DRAWN AEROBIC AND ANAEROBIC 5.5CC   Final    Culture  Setup Time 01/04/2012 23:29   Final    Culture NO GROWTH 5 DAYS   Final    Report Status 01/10/2012 FINAL   Final   CULTURE, RESPIRATORY     Status: Normal   Collection Time   01/04/12  3:37 PM      Component Value Range Status Comment   Specimen Description ENDOTRACHEAL   Final    Special Requests NONE   Final    Gram Stain     Final    Value: RARE WBC PRESENT, PREDOMINANTLY MONONUCLEAR     NO SQUAMOUS EPITHELIAL CELLS SEEN     NO ORGANISMS SEEN   Culture Non-Pathogenic Oropharyngeal-type Flora Isolated.   Final    Report Status 01/07/2012 FINAL   Final   URINE CULTURE     Status: Normal   Collection Time   01/04/12  4:59 PM      Component Value Range Status Comment   Specimen Description URINE, CATHETERIZED   Final    Special Requests zosyn,flagyl   Final    Culture  Setup Time 01/05/2012 02:21   Final    Colony Count NO GROWTH   Final    Culture NO GROWTH   Final    Report Status 01/05/2012 FINAL   Final   ANAEROBIC CULTURE     Status: Normal   Collection  Time   01/19/12  2:00 PM      Component Value Range Status Comment   Specimen Description PERITONEAL CAVITY   Final    Special  Requests Normal   Final    Gram Stain     Final    Value: FEW WBC PRESENT,BOTH PMN AND MONONUCLEAR     NO ORGANISMS SEEN   Culture NO ANAEROBES ISOLATED   Final    Report Status 01/24/2012 FINAL   Final   BODY FLUID CULTURE     Status: Normal   Collection Time   01/19/12  2:00 PM      Component Value Range Status Comment   Specimen Description FLUID PERITONEAL CAVITY   Final    Special Requests NONE   Final    Gram Stain     Final    Value: FEW WBC PRESENT,BOTH PMN AND MONONUCLEAR     NO ORGANISMS SEEN   Culture NO GROWTH 3 DAYS   Final    Report Status 01/23/2012 FINAL   Final   CLOSTRIDIUM DIFFICILE BY PCR     Status: Normal   Collection Time   01/24/12 10:05 AM      Component Value Range Status Comment   C difficile by pcr NEGATIVE  NEGATIVE Final     Studies/Results: Ct Abdomen Pelvis W Contrast  01/27/2012  *RADIOLOGY REPORT*  Clinical Data: Follow up abscess status post drain  CT ABDOMEN AND PELVIS WITH CONTRAST  Technique:  Multidetector CT imaging of the abdomen and pelvis was performed following the standard protocol during bolus administration of intravenous contrast.  Contrast: OMNIPAQUE IOHEXOL 300 MG/ML  SOLN  Comparison: 01/18/2012  Findings: Small right pleural effusion, decreased.  Small to moderate left pleural effusion, unchanged.  Basilar atelectasis in the bilateral lower lobes.  Liver, spleen, and right adrenal gland are within normal limits. Stable mild nodularity of the left adrenal gland (series 2/image 23).  Pancreas is notable for a hypoenhancing lesion in the pancreatic head measuring approximately 13 mm (series 2/image 33), grossly unchanged, possibly reflecting a small pseudocyst.  Surrounding peripancreatic fluid/stranding associated with the pancreatic head, improved.  Kidneys are within normal limits.  No hydronephrosis.  No  evidence of bowel obstruction.  3.2 x 10.4 cm thick-walled fluid collection in the mid abdomen (series 2/image 53), previously 6.2 x 15.6 cm.  New indwelling pigtail drainage catheter (when compared to prior diagnostic CT). Additional fluid tracks nearly along the right lateral abdominal wall/inferior liver (coronal image 24).  Associated 7.5 x 2.0 cm thick-walled fluid collection along the lateral aspect of the spleen (series 2/image 22), previously 11.0 x 2.7 cm.  A drainage catheter courses through this collection and terminates in the left anterior abdomen (series 2/image 17).  A third drainage catheter enters the right mid anterior abdominal wall and terminates in the right anterior abdomen (series 2/image 40).  No associated residual fluid collection.  Atherosclerotic calcifications of the abdominal aorta and branch vessels.  Scattered small abdominal/retroperitoneal lymph nodes which do not meet pathologic CT size criteria.  Uterus and bilateral ovaries are unremarkable.  Bladder is notable for nondependent gas (series 2/image 71), correlate for recent intervention.  IMPRESSION: Improving thick-walled abdominal fluid collections, as described above.  Dominant collection in the mid abdomen has an indwelling pigtail drainage catheter in satisfactory position.  This catheter enters along the right lateral abdomen.  Second drainage catheter enters to the left mid abdomen, courses through the left upper abdominal fluid component, but terminates outside the fluid collection.  Consider repositioning by withdrawing the catheter approximately 15 cm.  Third drainage catheter enters the abdomen just to the right of midline and is without residual fluid  collection.  Consider complete removal.   Original Report Authenticated By: Charline Bills, M.D.     Anti-infectives: Anti-infectives     Start     Dose/Rate Route Frequency Ordered Stop   01/23/12 1800   piperacillin-tazobactam (ZOSYN) IVPB 3.375 g        3.375  g 12.5 mL/hr over 240 Minutes Intravenous Every 8 hours 01/23/12 1155 01/30/12 1759   01/03/12 1600   metroNIDAZOLE (FLAGYL) IVPB 500 mg  Status:  Discontinued        500 mg 100 mL/hr over 60 Minutes Intravenous Every 8 hours 01/03/12 1525 01/23/12 1155   12/31/11 1100   vancomycin (VANCOCIN) 750 mg in sodium chloride 0.9 % 150 mL IVPB  Status:  Discontinued        750 mg 150 mL/hr over 60 Minutes Intravenous Every 12 hours 12/31/11 0958 01/03/12 0849   12/30/11 1000   piperacillin-tazobactam (ZOSYN) IVPB 3.375 g  Status:  Discontinued        3.375 g 12.5 mL/hr over 240 Minutes Intravenous Every 8 hours 12/30/11 0901 01/23/12 1155          Assessment/Plan: s/p right peritoneal fluid coll drainage 10/1; cont IR drain for now with f/u CT in 5-7 days; additional drain management per CCS; ambulate/OOB   LOS: 33 days    Patryk Conant,D [facility] 01/28/2012

## 2012-01-28 NOTE — Progress Notes (Addendum)
PARENTERAL NUTRITION CONSULT NOTE - FOLLOW UP  Pharmacy Consult for TNA Indication: Severe Pancreatitis  Allergies  Allergen Reactions  . Ciprofloxacin Nausea Only    REACTION: nausea   Patient Measurements: Height: 5\' 6"  (167.6 cm) Weight: 166 lb 10.7 oz (75.6 kg) IBW/kg (Calculated) : 59.3   Vital Signs: Temp: 99.6 F (37.6 C) (10/10 0558) Temp src: Oral (10/10 0558) BP: 120/67 mmHg (10/10 0558) Pulse Rate: 79  (10/10 0558) Intake/Output from previous day: 10/09 0701 - 10/10 0700 In: 240 [P.O.:240] Out: 600 [Urine:600] Intake/Output from this shift:    Labs: No results found for this basename: WBC:3,HGB:3,HCT:3,PLT:3,APTT:3,INR:3 in the last 72 hours  Basename 01/28/12 0407 01/26/12 0500  NA 135 132*  K 3.8 3.8  CL 100 98  CO2 27 25  GLUCOSE 110* 118*  BUN 11 12  CREATININE 0.51 0.49*  LABCREA -- --  CREAT24HRUR -- --  CALCIUM 8.8 8.5  MG 2.0 --  PHOS 4.0 --  PROT 7.3 --  ALBUMIN 1.9* --  AST 18 --  ALT 6 --  ALKPHOS 124* --  BILITOT 0.3 --  BILIDIR -- --  IBILI -- --  PREALBUMIN -- --  TRIG -- --  CHOLHDL -- --  CHOL -- --  Corrected Calcium = 10 Estimated Creatinine Clearance: 81.6 ml/min (by C-G formula based on Cr of 0.51).    Basename 01/27/12 2311 01/27/12 1723 01/27/12 1231  GLUCAP 145* 113* 113*   Insulin Requirements in the past 24 hours:  CBGs < 150, required 0 units of SSI last 24h.  Nutritional Goals:  RD recs 9/16: 1762 kcal, 86-95 grams protein Reestimated s/p extubation 9/30: 1475-1775 kcal, 94-110 grams protein Clinimix 5/15 @ 72ml/hr + Lipid 20% MWF will provide 96 g protein/day, 1843 kCal on lipid days, 1363 Kcal on non-lipid days for an average of 1569 Kcal per day/week.  Current nutrition:  Diet: to advanced to reg diet 10/9. mIVF: NS @ KVO Clinimix E 5/15 @ 80 ml/hr +  IVFE 20% @ 10 ml/hr on MWF  Assessment:  [name] with h/o recurrent pancreatitis and ETOH abuse, s/p ERCP with sphincterotomy on 10/06/2011 here this  admit with acute pancreatitis, developed ileus. Rapid response called 9/12 for respiratory decline and lethargy - pt transfer to ICU and CCM consulted. Intubated 9/12. 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   Pt underwent exploratory laparotomy with debridement of necrotic pancreas and drainage of infected, ruptured pseudocyst 9/15. TNA started 9/16.  9/30 CT abd = extensive loculated fluid in both sides of the abdomen consistent with a pseudocyst.  Additional drain placed by IR 10/1, 10/3.   Pt advanced to solid diet on 10/9. Continuing calorie count.    Other medication issues: Octreotide added 9/25, is this still necessary? Is pancrealipase indicated?  Labs:  Renal: Scr wnl/stable Hepatic fxn: AST/ALT improved to wnl. Electrolytes: Na remains low. Pre-Alb: pending. 2.3 (9/17), 12.2 (9/23), 11.4 (9/30), 9.9(10/7). TG/Cholesterol: TGs ok.  CBGs: at goal, requiring minimal insulin   Plan:    Continue Clinimix E 5/15 at 80 ml/hr.  IV fat emulsion on MWF only due to ongoing shortage.  Tolerating oral MVI.  F/u tolerance of diet for tapering of TNA soon.  TNA labs Monday/Thursdays.  Darrol Angel, PharmD Pager: [phone]  01/28/2012,7:16 AM.   ADDENDUM: Order to being weaning TNA. Will begin wean at 6pm tonight with the new bag.  1) At 18:00 decrease Clinimix E 5/15 to  40 ml/hr. 2) F/U PO diet tolerance and calorie count tomorrow - hopefully can fully d/c TNA soon.  Darrol Angel, PharmD Pager: [phone] 01/28/2012 12:00 PM

## 2012-01-28 NOTE — Progress Notes (Signed)
Patient information faxed to [facility] to see if they can accept under letter of guarantee.  [name] MSW, LCSW [phone]

## 2012-01-28 NOTE — Progress Notes (Signed)
Patient ID: [name], female   DOB: Nov 12, 1955, 56 y.o.   MRN: [id number] 22 Days Post-Op  Subjective: Pt c/o oral pain medication not helping too much.  Tolerated yogurt and cheerios for breakfast  Objective: Vital signs in last 24 hours: Temp:  [98.2 F (36.8 C)-99.8 F (37.7 C)] 99.6 F (37.6 C) (10/10 0558) Pulse Rate:  [79-86] 79  (10/10 0558) Resp:  [18-20] 20  (10/10 0558) BP: (117-121)/(55-68) 120/67 mmHg (10/10 0558) SpO2:  [92 %-96 %] 92 % (10/10 0217) Last BM Date: 01/27/12  Intake/Output from previous day: 10/09 0701 - 10/10 0700 In: 240 [P.O.:240] Out: 600 [Urine:600] Intake/Output this shift:    PE: Abd: soft, all drains with tan purulent appearing output, otherwise stable.  Lab Results:  No results found for this basename: WBC:2,HGB:2,HCT:2,PLT:2 in the last 72 hours BMET  Basename 01/28/12 0407 01/26/12 0500  NA 135 132*  K 3.8 3.8  CL 100 98  CO2 27 25  GLUCOSE 110* 118*  BUN 11 12  CREATININE 0.51 0.49*  CALCIUM 8.8 8.5   PT/INR No results found for this basename: LABPROT:2,INR:2 in the last 72 hours CMP     Component Value Date/Time   NA 135 01/28/2012 0407   K 3.8 01/28/2012 0407   CL 100 01/28/2012 0407   CO2 27 01/28/2012 0407   GLUCOSE 110* 01/28/2012 0407   BUN 11 01/28/2012 0407   CREATININE 0.51 01/28/2012 0407   CALCIUM 8.8 01/28/2012 0407   PROT 7.3 01/28/2012 0407   ALBUMIN 1.9* 01/28/2012 0407   AST 18 01/28/2012 0407   ALT 6 01/28/2012 0407   ALKPHOS 124* 01/28/2012 0407   BILITOT 0.3 01/28/2012 0407   GFRNONAA >90 01/28/2012 0407   GFRAA >90 01/28/2012 0407   Lipase     Component Value Date/Time   LIPASE 113* 01/11/2012 0540       Studies/Results: Ct Abdomen Pelvis W Contrast  01/27/2012  *RADIOLOGY REPORT*  Clinical Data: Follow up abscess status post drain  CT ABDOMEN AND PELVIS WITH CONTRAST  Technique:  Multidetector CT imaging of the abdomen and pelvis was performed following the standard protocol  during bolus administration of intravenous contrast.  Contrast: OMNIPAQUE IOHEXOL 300 MG/ML  SOLN  Comparison: 01/18/2012  Findings: Small right pleural effusion, decreased.  Small to moderate left pleural effusion, unchanged.  Basilar atelectasis in the bilateral lower lobes.  Liver, spleen, and right adrenal gland are within normal limits. Stable mild nodularity of the left adrenal gland (series 2/image 23).  Pancreas is notable for a hypoenhancing lesion in the pancreatic head measuring approximately 13 mm (series 2/image 33), grossly unchanged, possibly reflecting a small pseudocyst.  Surrounding peripancreatic fluid/stranding associated with the pancreatic head, improved.  Kidneys are within normal limits.  No hydronephrosis.  No evidence of bowel obstruction.  3.2 x 10.4 cm thick-walled fluid collection in the mid abdomen (series 2/image 53), previously 6.2 x 15.6 cm.  New indwelling pigtail drainage catheter (when compared to prior diagnostic CT). Additional fluid tracks nearly along the right lateral abdominal wall/inferior liver (coronal image 24).  Associated 7.5 x 2.0 cm thick-walled fluid collection along the lateral aspect of the spleen (series 2/image 22), previously 11.0 x 2.7 cm.  A drainage catheter courses through this collection and terminates in the left anterior abdomen (series 2/image 17).  A third drainage catheter enters the right mid anterior abdominal wall and terminates in the right anterior abdomen (series 2/image 40).  No associated residual fluid collection.  Atherosclerotic calcifications of the abdominal aorta and branch vessels.  Scattered small abdominal/retroperitoneal lymph nodes which do not meet pathologic CT size criteria.  Uterus and bilateral ovaries are unremarkable.  Bladder is notable for nondependent gas (series 2/image 71), correlate for recent intervention.  IMPRESSION: Improving thick-walled abdominal fluid collections, as described above.  Dominant collection in  the mid abdomen has an indwelling pigtail drainage catheter in satisfactory position.  This catheter enters along the right lateral abdomen.  Second drainage catheter enters to the left mid abdomen, courses through the left upper abdominal fluid component, but terminates outside the fluid collection.  Consider repositioning by withdrawing the catheter approximately 15 cm.  Third drainage catheter enters the abdomen just to the right of midline and is without residual fluid collection.  Consider complete removal.   Original Report Authenticated By: Charline Bills, M.D.     Anti-infectives: Anti-infectives     Start     Dose/Rate Route Frequency Ordered Stop   01/23/12 1800  piperacillin-tazobactam (ZOSYN) IVPB 3.375 g       3.375 g 12.5 mL/hr over 240 Minutes Intravenous Every 8 hours 01/23/12 1155 01/30/12 1759   01/03/12 1600   metroNIDAZOLE (FLAGYL) IVPB 500 mg  Status:  Discontinued        500 mg 100 mL/hr over 60 Minutes Intravenous Every 8 hours 01/03/12 1525 01/23/12 1155   12/31/11 1100   vancomycin (VANCOCIN) 750 mg in sodium chloride 0.9 % 150 mL IVPB  Status:  Discontinued        750 mg 150 mL/hr over 60 Minutes Intravenous Every 12 hours 12/31/11 0958 01/03/12 0849   12/30/11 1000   piperacillin-tazobactam (ZOSYN) IVPB 3.375 g  Status:  Discontinued        3.375 g 12.5 mL/hr over 240 Minutes Intravenous Every 8 hours 12/30/11 0901 01/23/12 1155          Assessment/Plan  1.s/p ruptured pancreatic pseudocyst  Exploratory lap, debridement of necrotic pancreas - M. Daphine Deutscher - 01/03/2012   Perc drain (3rd drain) - 01/18/2012 2. PCM/TNA  Prealbumin - 9.9 - 01/25/2012 3. DVT - Pro, lovenox 4. Multiple abdominal fluid collections 5. 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 Ganem - 01/03/2012  6. Alcohol abuse     Plan: 1. CT scan noted, still with multiple fluid collections for which all of her drains need to remain in place. 2. Await calorie count to determine when we can  start weaning TNA, spoke to nutrition.  She feels it is ok to start weaning the TNA 3. Try to adjust oral pain meds to help with pain better 4. SW consult for SNF placement, hopefully early next week.   LOS: 33 days    OSBORNE,KELLY E 01/28/2012, 9:46 AM Pager: [phone]  Slowly getting better,, though today she says she is sorer.  The major obstacle now is poor po intake.  She also still has some pain with eating.  Ovidio Kin, MD, [facility] Pager: [phone] Office phone:  [phone]

## 2012-01-29 LAB — GLUCOSE, CAPILLARY: Glucose-Capillary: 112 mg/dL — ABNORMAL HIGH (ref 70–99)

## 2012-01-29 MED ORDER — HYDROMORPHONE HCL PF 1 MG/ML IJ SOLN
0.5000 mg | INTRAMUSCULAR | Status: DC | PRN
  Administered 2012-01-29 – 2012-02-05 (×55): 0.5 mg via INTRAVENOUS
  Filled 2012-01-29 (×56): qty 1

## 2012-01-29 MED ORDER — FAT EMULSION 20 % IV EMUL
250.0000 mL | INTRAVENOUS | Status: AC
  Administered 2012-01-29: 250 mL via INTRAVENOUS
  Filled 2012-01-29: qty 250

## 2012-01-29 MED ORDER — CLINIMIX E/DEXTROSE (5/15) 5 % IV SOLN
INTRAVENOUS | Status: AC
  Administered 2012-01-29: 18:00:00 via INTRAVENOUS
  Filled 2012-01-29: qty 1000

## 2012-01-29 NOTE — Progress Notes (Signed)
Patient ID: [name], female   DOB: [birth year], 56 y.o.   MRN: [id number] 23 Days Post-Op  Subjective: Pt feels well today.  Didn't try vicodin yesterday.  Ate some more food yesterday and tolerated it well.  Objective: Vital signs in last 24 hours: Temp:  [98.1 F (36.7 C)-98.5 F (36.9 C)] 98.5 F (36.9 C) (10/11 0553) Pulse Rate:  [74-86] 86  (10/11 0553) Resp:  [18-20] 20  (10/11 0553) BP: (100-117)/(54-65) 113/54 mmHg (10/11 0553) SpO2:  [89 %-94 %] 89 % (10/11 0553) Last BM Date: 01/27/12  Intake/Output from previous day: 10/10 0701 - 10/11 0700 In: 240 [P.O.:240] Out: 580 [Urine:500; Drains:80] Intake/Output this shift:    PE: Abd: soft, drains are all stable with continued tan purulent appearing drainage.  Wound VAC dc and wound is mostly clean and closing well.  The superior portion does have some fibrin tissue over her fascial sutures.  Lab Results:  No results found for this basename: WBC:2,HGB:2,HCT:2,PLT:2 in the last 72 hours BMET  [facility] 01/28/12 0407  NA 135  K 3.8  CL 100  CO2 27  GLUCOSE 110*  BUN 11  CREATININE 0.51  CALCIUM 8.8   PT/INR No results found for this basename: LABPROT:2,INR:2 in the last 72 hours CMP     Component Value Date/Time   NA 135 01/28/2012 0407   K 3.8 01/28/2012 0407   CL 100 01/28/2012 0407   CO2 27 01/28/2012 0407   GLUCOSE 110* 01/28/2012 0407   BUN 11 01/28/2012 0407   CREATININE 0.51 01/28/2012 0407   CALCIUM 8.8 01/28/2012 0407   PROT 7.3 01/28/2012 0407   ALBUMIN 1.9* 01/28/2012 0407   AST 18 01/28/2012 0407   ALT 6 01/28/2012 0407   ALKPHOS 124* 01/28/2012 0407   BILITOT 0.3 01/28/2012 0407   GFRNONAA >90 01/28/2012 0407   GFRAA >90 01/28/2012 0407   Lipase     Component Value Date/Time   LIPASE 113* 01/11/2012 0540    Studies/Results: Ct Abdomen Pelvis W Contrast  01/27/2012  *RADIOLOGY REPORT*  Clinical Data: Follow up abscess status post drain  CT ABDOMEN AND PELVIS WITH CONTRAST   Technique:  Multidetector CT imaging of the abdomen and pelvis was performed following the standard protocol during bolus administration of intravenous contrast.  Contrast: OMNIPAQUE IOHEXOL 300 MG/ML  SOLN  Comparison: 01/18/2012  Findings: Small right pleural effusion, decreased.  Small to moderate left pleural effusion, unchanged.  Basilar atelectasis in the bilateral lower lobes.  Liver, spleen, and right adrenal gland are within normal limits. Stable mild nodularity of the left adrenal gland (series 2/image 23).  Pancreas is notable for a hypoenhancing lesion in the pancreatic head measuring approximately 13 mm (series 2/image 33), grossly unchanged, possibly reflecting a small pseudocyst.  Surrounding peripancreatic fluid/stranding associated with the pancreatic head, improved.  Kidneys are within normal limits.  No hydronephrosis.  No evidence of bowel obstruction.  3.2 x 10.4 cm thick-walled fluid collection in the mid abdomen (series 2/image 53), previously 6.2 x 15.6 cm.  New indwelling pigtail drainage catheter (when compared to prior diagnostic CT). Additional fluid tracks nearly along the right lateral abdominal wall/inferior liver (coronal image 24).  Associated 7.5 x 2.0 cm thick-walled fluid collection along the lateral aspect of the spleen (series 2/image 22), previously 11.0 x 2.7 cm.  A drainage catheter courses through this collection and terminates in the left anterior abdomen (series 2/image 17).  A third drainage catheter enters the right mid anterior abdominal wall  and terminates in the right anterior abdomen (series 2/image 40).  No associated residual fluid collection.  Atherosclerotic calcifications of the abdominal aorta and branch vessels.  Scattered small abdominal/retroperitoneal lymph nodes which do not meet pathologic CT size criteria.  Uterus and bilateral ovaries are unremarkable.  Bladder is notable for nondependent gas (series 2/image 71), correlate for recent intervention.   IMPRESSION: Improving thick-walled abdominal fluid collections, as described above.  Dominant collection in the mid abdomen has an indwelling pigtail drainage catheter in satisfactory position.  This catheter enters along the right lateral abdomen.  Second drainage catheter enters to the left mid abdomen, courses through the left upper abdominal fluid component, but terminates outside the fluid collection.  Consider repositioning by withdrawing the catheter approximately 15 cm.  Third drainage catheter enters the abdomen just to the right of midline and is without residual fluid collection.  Consider complete removal.   Original Report Authenticated By: Charline Bills, M.D.     Anti-infectives: Anti-infectives     Start     Dose/Rate Route Frequency Ordered Stop   01/23/12 1800  piperacillin-tazobactam (ZOSYN) IVPB 3.375 g       3.375 g 12.5 mL/hr over 240 Minutes Intravenous Every 8 hours 01/23/12 1155 01/30/12 1759   01/03/12 1600   metroNIDAZOLE (FLAGYL) IVPB 500 mg  Status:  Discontinued        500 mg 100 mL/hr over 60 Minutes Intravenous Every 8 hours 01/03/12 1525 01/23/12 1155   12/31/11 1100   vancomycin (VANCOCIN) 750 mg in sodium chloride 0.9 % 150 mL IVPB  Status:  Discontinued        750 mg 150 mL/hr over 60 Minutes Intravenous Every 12 hours 12/31/11 0958 01/03/12 0849   12/30/11 1000   piperacillin-tazobactam (ZOSYN) IVPB 3.375 g  Status:  Discontinued        3.375 g 12.5 mL/hr over 240 Minutes Intravenous Every 8 hours 12/30/11 0901 01/23/12 1155           Assessment/Plan  1. S/p ex lap with debridement of ruptured pancreatic pseudocyst - M. Daphine Deutscher - 01/03/2012  3rd drain (right abdomen) placed percutaneously - 01/18/2012 2. PCM/TNA 3. Deconditioning 4. dvt proph - Lovenox   Plan: 1. Will dc VAC today.  Wound is fairly shallow and there is some fibrin tissue at superior aspect of wound that I would like to try and clean up with WD dressing changes.   2. Cont TNA  at weaned rate.  I'm hoping to be able to dc TNA within the next day or two. 3. Cont all drains 4. Patient still on abx therapy.  Will d/w MD length of treatment, etc 5. Will also discuss if octreotide is still necessary.  LOS: 34 days    OSBORNE,KELLY E 01/29/2012, 7:51 AM Pager: [phone]  Agree with above.  Ovidio Kin, MD, [name] Surgery Pager: [phone] Office phone:  [phone]

## 2012-01-29 NOTE — Progress Notes (Signed)
Nutrition Brief Note  Diet: Regular   TPN: Clinimix E 5/15 @ 40 ml/hr.  Lipids (20% IVFE @ 10 ml/hr), multivitamins, and trace elements are provided 3 times weekly (MWF) due to national backorder.  Provides 888 kcal and 48 grams protein daily (based on weekly average).  Meets 50% minimum estimated kcal and 44% minimum estimated protein needs.  Additional IVF with NS @ 10-20 ml/hr.  - Met with pt this morning to see how she did with the rest of her meals yesterday. Pt states she ordered mashed potatoes, corn muffin, green beans, and milk. Pt drank all of the milk and consumed all of the mashed potatoes, however only ate 30% of the other 2 items - a total estimated 452 calories, 19g protein for the meal. It appears pt's intake slowly improving with solid foods. Pt's only main c/o with nutrition is that if she is in too much pain she does not want to eat. PA and nursing working hard to get pt's pain under control. Noted TPN running at weaned rate. RD to monitor intake over the weekend.   Levon Hedger MS, RD, LDN [phone] Pager [phone] After Hours Pager

## 2012-01-29 NOTE — Progress Notes (Addendum)
PARENTERAL NUTRITION CONSULT NOTE - FOLLOW UP  Pharmacy Consult for TNA Indication: Severe Pancreatitis  Allergies  Allergen Reactions  . Ciprofloxacin Nausea Only    REACTION: nausea   Patient Measurements: Height: 5\' 6"  (167.6 cm) Weight: 166 lb 10.7 oz (75.6 kg) IBW/kg (Calculated) : 59.3   Vital Signs: Temp: 98.3 F (36.8 C) (10/11 0210) Temp src: Oral (10/11 0210) BP: 117/54 mmHg (10/11 0210) Pulse Rate: 79  (10/11 0210) Intake/Output from previous day: 10/10 0701 - 10/11 0700 In: 240 [P.O.:240] Out: 500 [Urine:500] Intake/Output from this shift:    Labs: No results found for this basename: WBC:3,HGB:3,HCT:3,PLT:3,APTT:3,INR:3 in the last 72 hours  Basename 01/28/12 0407  NA 135  K 3.8  CL 100  CO2 27  GLUCOSE 110*  BUN 11  CREATININE 0.51  LABCREA --  CREAT24HRUR --  CALCIUM 8.8  MG 2.0  PHOS 4.0  PROT 7.3  ALBUMIN 1.9*  AST 18  ALT 6  ALKPHOS 124*  BILITOT 0.3  BILIDIR --  IBILI --  PREALBUMIN --  TRIG --  CHOLHDL --  CHOL --  Corrected Calcium = 10 Estimated Creatinine Clearance: 81.6 ml/min (by C-G formula based on Cr of 0.51).    Basename 01/29/12 0552 01/29/12 0011 01/28/12 2125  GLUCAP 102* 112* 99   Insulin Requirements in the past 24 hours:  CBGs < 150, required 0 units of SSI last 24h.  Nutritional Goals:  RD recs 9/16: 1762 kcal, 86-95 grams protein Reestimated s/p extubation 9/30: 1475-1775 kcal, 94-110 grams protein Clinimix 5/15 @ 66ml/hr + Lipid 20% MWF will provide 96 g protein/day, 1843 kCal on lipid days, 1363 Kcal on non-lipid days for an average of 1569 Kcal per day/week.  Current nutrition:  Diet: to advanced to reg diet 10/9. mIVF: NS @ KVO Clinimix E 5/15 weaned to 40 ml/hr +  IVFE 20% @ 10 ml/hr on MWF  Assessment:  [name] with h/o recurrent pancreatitis and ETOH abuse, s/p ERCP with sphincterotomy on 10/06/2011 here this admit with acute pancreatitis, developed ileus. Rapid response called 9/12 for respiratory  decline and lethargy - pt transfer to ICU and CCM consulted. Intubated 9/12. 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   Pt underwent exploratory laparotomy with debridement of necrotic pancreas and drainage of infected, ruptured pseudocyst 9/15. TNA started 9/16.  9/30 CT abd = extensive loculated fluid in both sides of the abdomen consistent with a pseudocyst.  Additional drain placed by IR 10/1, 10/3.   Pt advanced to solid diet on 10/9. 48 hour calorie count completed, will f/u with plan.    Other medication issues: Octreotide added 9/25, is this still necessary? Is pancrealipase indicated?  Labs:  Renal: Scr wnl/stable Hepatic fxn: AST/ALT improved to wnl. Electrolytes: Na remains low. Pre-Alb: 2.3 (9/17), 12.2 (9/23), 11.4 (9/30), 9.9(10/7). TG/Cholesterol: TGs ok.  CBGs: at goal, requiring minimal insulin   Plan:    Continue Clinimix E 5/15 at 40 ml/hr   IV fat emulsion on MWF only due to ongoing shortage.  Tolerating oral MVI.  F/u tolerance of diet for tapering of TNA soon.  TNA labs Monday/Thursdays.   Janita Camberos, Loma Messing PharmD Pager #: [phone] 7:16 AM 01/29/2012

## 2012-01-30 LAB — GLUCOSE, CAPILLARY
Glucose-Capillary: 128 mg/dL — ABNORMAL HIGH (ref 70–99)
Glucose-Capillary: 135 mg/dL — ABNORMAL HIGH (ref 70–99)
Glucose-Capillary: 124 mg/dL — ABNORMAL HIGH (ref 70–99)

## 2012-01-30 MED ORDER — CLINIMIX E/DEXTROSE (5/15) 5 % IV SOLN
INTRAVENOUS | Status: AC
  Administered 2012-01-30: 17:00:00 via INTRAVENOUS
  Filled 2012-01-30: qty 1000

## 2012-01-30 NOTE — Progress Notes (Signed)
Calorie Count Note  Breakfast: 345 kcal, 17 grams protein  Lunch: reports she didn't feel hungry so she didn't eat Snack: 100 kcal, 4 grams protein  Total intake: 445 kcal 21 grams protein   Per MD note continue TPN for now.   Pt seems to be tolerating po's and appears to choose not to eat.   Kendell Bane RD, LDN, CNSC [phone] Weekend/After Hours Pager

## 2012-01-30 NOTE — Progress Notes (Signed)
Occupational Therapy Treatment Patient Details Name: [name] MRN: [id number] DOB: [birth year] Today's Date: 01/30/2012 Time: 4696-2952 OT Time Calculation (min): 16 min  OT Assessment / Plan / Recommendation Comments on Treatment Session working with orange theraband tied at [facility] for posterior delts and triceps.  Handout given.  Encouraged pt to have staff make sure that PICC lines are free prior to exercise.  Resistance used from 90 degrees and below without any pulling on incision site.      Follow Up Recommendations  Skilled nursing facility    Barriers to Discharge       Equipment Recommendations  Rolling walker with 5" wheels;3 in 1 bedside comode    Recommendations for Other Services    Frequency     Plan      Precautions / Restrictions Precautions Precautions: Fall Restrictions Weight Bearing Restrictions: No   Pertinent Vitals/Pain Premedicated.  No pain working at bed level today    ADL  Transfers/Ambulation Related to ADLs: Pt had been up ambulating earlier and had done all adls.  States she wants to get arms stronger.  Provided orange theraband and instructions for posterior delts and triceps at 90 degrees and below.  Pt is working on shoulder flexion--lifting to wfls without straining stomach.  Theraband did not bother stomach area.      OT Diagnosis:    OT Problem List:   OT Treatment Interventions:     OT Goals Arm Goals Pt Will Complete Theraband Exer: with supervision, verbal cues required/provided;Bilateral upper extremities;to increase strength;1 set;10 reps;Level 1 Theraband Arm Goal: Theraband Exercises - Progress: Other (comment) (re-set goal today)  Visit Information  Last OT Received On: 01/30/12 Assistance Needed: +1    Subjective Data      Prior Functioning       Cognition       Mobility  Shoulder Instructions         Exercises  General Exercises - Upper Extremity Shoulder Extension: Theraband;10 reps (orange tied at  [facility]; 90 degrees pulling down) Elbow Extension: Theraband;10 reps (orange tied at [facility], 90 degrees, straightening elbow)   Balance     End of Session OT - End of Session Activity Tolerance: Patient tolerated treatment well Patient left: in bed;with call bell/phone within reach  GO     [name] 01/30/2012, 4:15 PM [name], OTR/L [phone] 01/30/2012

## 2012-01-30 NOTE — Progress Notes (Signed)
PARENTERAL NUTRITION CONSULT NOTE - FOLLOW UP  Pharmacy Consult for TNA Indication: Severe Pancreatitis  Allergies  Allergen Reactions  . Ciprofloxacin Nausea Only    REACTION: nausea   Patient Measurements: Height: 5\' 6"  (167.6 cm) Weight: 166 lb 10.7 oz (75.6 kg) IBW/kg (Calculated) : 59.3   Vital Signs: Temp: 98.8 F (37.1 C) (10/12 0612) Temp src: Oral (10/12 0612) BP: 122/72 mmHg (10/12 0612) Pulse Rate: 81  (10/12 0612) Intake/Output from previous day: 10/11 0701 - 10/12 0700 In: 240 [P.O.:240] Out: -  Intake/Output from this shift:    Labs: No results found for this basename: WBC:3,HGB:3,HCT:3,PLT:3,APTT:3,INR:3 in the last 72 hours  Basename 01/28/12 0407  NA 135  K 3.8  CL 100  CO2 27  GLUCOSE 110*  BUN 11  CREATININE 0.51  LABCREA --  CREAT24HRUR --  CALCIUM 8.8  MG 2.0  PHOS 4.0  PROT 7.3  ALBUMIN 1.9*  AST 18  ALT 6  ALKPHOS 124*  BILITOT 0.3  BILIDIR --  IBILI --  PREALBUMIN --  TRIG --  CHOLHDL --  CHOL --  Corrected Calcium = 10 Estimated Creatinine Clearance: 81.6 ml/min (by C-G formula based on Cr of 0.51).    Basename 01/29/12 1815 01/29/12 1128 01/29/12 0552  GLUCAP 135* 118* 102*   Insulin Requirements in the past 24 hours:  CBGs < 150, required 0 units of SSI last 24h.  Nutritional Goals:  RD recs 9/16: 1762 kcal, 86-95 grams protein Reestimated s/p extubation 9/30: 1475-1775 kcal, 94-110 grams protein Clinimix 5/15 @ 1ml/hr + Lipid 20% MWF will provide 96 g protein/day, 1843 kCal on lipid days, 1363 Kcal on non-lipid days for an average of 1569 Kcal per day/week.  Current nutrition:  Diet: to advanced to reg diet 10/9. - PO intake is improving /progressing per RD note mIVF: NS @ KVO Clinimix E 5/15 weaned to 40 ml/hr +  IVFE 20% @ 10 ml/hr on MWF   Assessment:  [name] with h/o recurrent pancreatitis and ETOH abuse, s/p ERCP with sphincterotomy on 10/06/2011 here this admit with acute pancreatitis, developed ileus.  Rapid response called 9/12 for respiratory decline and lethargy - pt transfer to ICU and CCM consulted. Intubated 9/12. 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   Pt underwent exploratory laparotomy with debridement of necrotic pancreas and drainage of infected, ruptured pseudocyst 9/15. TNA started 9/16.  9/30 CT abd = extensive loculated fluid in both sides of the abdomen consistent with a pseudocyst.  Additional drain placed by IR 10/1, 10/3.   TNA is now running at a weaned rate as patient works toward po intake goals  Labs based on 10/10:  Renal: Scr wnl/stable Hepatic fxn: AST/ALT improved to wnl. Electrolytes: Na remains low. Pre-Alb: 2.3 (9/17), 12.2 (9/23), 11.4 (9/30), 9.9(10/7). TG/Cholesterol: TGs ok.  CBGs: at goal, requiring minimal insulin   Plan:    Continue Clinimix E 5/15 at 40 ml/hr - Will follow up plan for today with surgery team  IV fat emulsion on MWF only due to ongoing shortage.  Tolerating oral MVI.  F/u tolerance of diet for tapering of TNA soon.  TNA labs Monday/Thursdays.   Deontay Ladnier, Loma Messing PharmD Pager #: [phone] 6:42 AM 01/30/2012

## 2012-01-30 NOTE — Progress Notes (Signed)
Subjective: Pt tearful about slow progress.   Objective: Physical Exam: BP 157/82  Pulse 86  Temp 97.6 F (36.4 C) (Oral)  Resp 18  Ht 5\' 6"  (1.676 m)  Wt 166 lb 10.7 oz (75.6 kg)  BMI 26.90 kg/m2  SpO2 94%  LMP 12/08/2006 Rt lat perit drain intact, site clean. Some serosanguinous output with purulent stranding noted    Labs: CBC No results found for this basename: WBC:2,HGB:2,HCT:2,PLT:2 in the last 72 hours BMET  [facility] 01/28/12 0407  NA 135  K 3.8  CL 100  CO2 27  GLUCOSE 110*  BUN 11  CREATININE 0.51  CALCIUM 8.8   LFT  Basename 01/28/12 0407  PROT 7.3  ALBUMIN 1.9*  AST 18  ALT 6  ALKPHOS 124*  BILITOT 0.3  BILIDIR --  IBILI --  LIPASE --   PT/INR No results found for this basename: LABPROT:2,INR:2 in the last 72 hours   Studies/Results: No results found.  Assessment/Plan: s/p right peritoneal fluid coll drainage 10/1; cont IR drain for now with f/u CT in 5-7 days; additional drain management per CCS; ambulate/OOB      LOS: 35 days    Brayton El PA-C 01/30/2012 8:39 AM

## 2012-01-30 NOTE — Progress Notes (Signed)
24 Days Post-Op  Subjective: Having some epigastric pain after eating.  Objective: Vital signs in last 24 hours: Temp:  [97.6 F (36.4 C)-99.8 F (37.7 C)] 97.6 F (36.4 C) (10/12 0653) Pulse Rate:  [71-86] 86  (10/12 0653) Resp:  [18-20] 18  (10/12 0653) BP: (92-157)/(48-82) 157/82 mmHg (10/12 0653) SpO2:  [92 %-94 %] 94 % (10/12 0653) Last BM Date: 01/29/12  Intake/Output from previous day: 10/11 0701 - 10/12 0700 In: 240 [P.O.:240] Out: -  Intake/Output this shift:    PE: Abd-soft, open wound is fairly clean, all drains with yellowish output  Lab Results:  No results found for this basename: WBC:2,HGB:2,HCT:2,PLT:2 in the last 72 hours BMET  [name] 01/28/12 0407  NA 135  K 3.8  CL 100  CO2 27  GLUCOSE 110*  BUN 11  CREATININE 0.51  CALCIUM 8.8   PT/INR No results found for this basename: LABPROT:2,INR:2 in the last 72 hours Comprehensive Metabolic Panel:    Component Value Date/Time   NA 135 01/28/2012 0407   K 3.8 01/28/2012 0407   CL 100 01/28/2012 0407   CO2 27 01/28/2012 0407   BUN 11 01/28/2012 0407   CREATININE 0.51 01/28/2012 0407   GLUCOSE 110* 01/28/2012 0407   CALCIUM 8.8 01/28/2012 0407   AST 18 01/28/2012 0407   ALT 6 01/28/2012 0407   ALKPHOS 124* 01/28/2012 0407   BILITOT 0.3 01/28/2012 0407   PROT 7.3 01/28/2012 0407   ALBUMIN 1.9* 01/28/2012 0407     Studies/Results: No results found.  Anti-infectives: Anti-infectives     Start     Dose/Rate Route Frequency Ordered Stop   01/23/12 1800  piperacillin-tazobactam (ZOSYN) IVPB 3.375 g       3.375 g 12.5 mL/hr over 240 Minutes Intravenous Every 8 hours 01/23/12 1155 01/30/12 1759   01/03/12 1600   metroNIDAZOLE (FLAGYL) IVPB 500 mg  Status:  Discontinued        500 mg 100 mL/hr over 60 Minutes Intravenous Every 8 hours 01/03/12 1525 01/23/12 1155   12/31/11 1100   vancomycin (VANCOCIN) 750 mg in sodium chloride 0.9 % 150 mL IVPB  Status:  Discontinued        750 mg 150  mL/hr over 60 Minutes Intravenous Every 12 hours 12/31/11 0958 01/03/12 0849   12/30/11 1000   piperacillin-tazobactam (ZOSYN) IVPB 3.375 g  Status:  Discontinued        3.375 g 12.5 mL/hr over 240 Minutes Intravenous Every 8 hours 12/30/11 0901 01/23/12 1155          Assessment Principal Problem:  1. S/p ex lap with debridement of ruptured pancreatic pseudocyst - M. Daphine Deutscher - 01/03/2012  3rd drain (right abdomen) placed percutaneously - 01/18/2012 -still having some pain after eating; afebrile on antibiotics. 2. PCM/TNA  3. Deconditioning  4. dvt proph - Lovenox    LOS: 35 days   Plan: Continue TPN. Check cbc tomorrow and if WBC normal will stop antibiotics.   Jette Lewan J 01/30/2012

## 2012-01-31 LAB — GLUCOSE, CAPILLARY: Glucose-Capillary: 117 mg/dL — ABNORMAL HIGH (ref 70–99)

## 2012-01-31 LAB — CBC
Platelets: 512 10*3/uL — ABNORMAL HIGH (ref 150–400)
RBC: 3.54 MIL/uL — ABNORMAL LOW (ref 3.87–5.11)
RDW: 17.2 % — ABNORMAL HIGH (ref 11.5–15.5)
WBC: 9.5 10*3/uL (ref 4.0–10.5)

## 2012-01-31 MED ORDER — HYDROMORPHONE HCL PF 1 MG/ML IJ SOLN
0.5000 mg | Freq: Once | INTRAMUSCULAR | Status: AC
  Administered 2012-01-31: 0.5 mg via INTRAVENOUS

## 2012-01-31 MED ORDER — FENTANYL 25 MCG/HR TD PT72
25.0000 ug | MEDICATED_PATCH | TRANSDERMAL | Status: DC
  Administered 2012-01-31 – 2012-02-09 (×4): 25 ug via TRANSDERMAL
  Filled 2012-01-31 (×4): qty 1

## 2012-01-31 MED ORDER — CLINIMIX E/DEXTROSE (5/15) 5 % IV SOLN
INTRAVENOUS | Status: AC
  Administered 2012-01-31: 18:00:00 via INTRAVENOUS
  Filled 2012-01-31: qty 1000

## 2012-01-31 NOTE — Progress Notes (Signed)
25 Days Post-Op  Subjective: Still having intermittent sharp epigastric pains.  Objective: Vital signs in last 24 hours: Temp:  [98.1 F (36.7 C)-99.3 F (37.4 C)] 98.5 F (36.9 C) (10/13 0537) Pulse Rate:  [73-78] 74  (10/13 0537) Resp:  [16-18] 16  (10/13 0537) BP: (110-118)/(57-72) 112/67 mmHg (10/13 0537) SpO2:  [91 %-94 %] 91 % (10/13 0537) Last BM Date: 01/30/12  Intake/Output from previous day: 10/12 0701 - 10/13 0700 In: 851.8 [TPN:851.8] Out: 310 [Urine:300; Drains:10] Intake/Output this shift:    PE: Abd-soft, all drains with thin yellowish output  Lab Results:   Basename 01/31/12 0422  WBC 9.5  HGB 9.7*  HCT 31.7*  PLT 512*   BMET No results found for this basename: NA:2,K:2,CL:2,CO2:2,GLUCOSE:2,BUN:2,CREATININE:2,CALCIUM:2 in the last 72 hours PT/INR No results found for this basename: LABPROT:2,INR:2 in the last 72 hours Comprehensive Metabolic Panel:    Component Value Date/Time   NA 135 01/28/2012 0407   K 3.8 01/28/2012 0407   CL 100 01/28/2012 0407   CO2 27 01/28/2012 0407   BUN 11 01/28/2012 0407   CREATININE 0.51 01/28/2012 0407   GLUCOSE 110* 01/28/2012 0407   CALCIUM 8.8 01/28/2012 0407   AST 18 01/28/2012 0407   ALT 6 01/28/2012 0407   ALKPHOS 124* 01/28/2012 0407   BILITOT 0.3 01/28/2012 0407   PROT 7.3 01/28/2012 0407   ALBUMIN 1.9* 01/28/2012 0407     Studies/Results: No results found.  Anti-infectives: Anti-infectives     Start     Dose/Rate Route Frequency Ordered Stop   01/23/12 1800  piperacillin-tazobactam (ZOSYN) IVPB 3.375 g       3.375 g 12.5 mL/hr over 240 Minutes Intravenous Every 8 hours 01/23/12 1155 01/30/12 1452   01/03/12 1600   metroNIDAZOLE (FLAGYL) IVPB 500 mg  Status:  Discontinued        500 mg 100 mL/hr over 60 Minutes Intravenous Every 8 hours 01/03/12 1525 01/23/12 1155   12/31/11 1100   vancomycin (VANCOCIN) 750 mg in sodium chloride 0.9 % 150 mL IVPB  Status:  Discontinued        750 mg 150  mL/hr over 60 Minutes Intravenous Every 12 hours 12/31/11 0958 01/03/12 0849   12/30/11 1000   piperacillin-tazobactam (ZOSYN) IVPB 3.375 g  Status:  Discontinued        3.375 g 12.5 mL/hr over 240 Minutes Intravenous Every 8 hours 12/30/11 0901 01/23/12 1155          Assessment Principal Problem:  1. S/p ex lap with debridement of ruptured pancreatic pseudocyst - M. Daphine Deutscher - 01/03/2012  3rd drain (right abdomen) placed percutaneously - 01/18/2012 -still having some pain after eating; afebrile on antibiotics; WBC normal. 2. PCM/TNA  3. Deconditioning  4. dvt proph - Lovenox    LOS: 36 days   Plan: Continue TPN. Stop antibiotics.  Continue Octreotide.  Fentanyl patch.   Kelsea Mousel J 01/31/2012

## 2012-01-31 NOTE — Progress Notes (Signed)
Calorie Count Note  Dinner (10/12): 150 kcal, 4 g protein Breakfast (10/13): Did not eat Lunch (10/13): Has not ordered at this time, not hungry.   TPN reduced to Clinimix 5/15 at 40 ml/hr with lipids at 10 ml/hr MWF providing an average of 888 kcal, 48 g protein.   Patient reports severe abdominal pain and nausea today. She does not want to eat.   Linnell Fulling, RD, LDN Pager #: [phone] After-Hours Pager #: [phone]

## 2012-01-31 NOTE — Progress Notes (Signed)
PARENTERAL NUTRITION CONSULT NOTE - FOLLOW UP  Pharmacy Consult for TNA Indication: Severe Pancreatitis  Allergies  Allergen Reactions  . Ciprofloxacin Nausea Only    REACTION: nausea   Patient Measurements: Height: 5\' 6"  (167.6 cm) Weight: 166 lb 10.7 oz (75.6 kg) IBW/kg (Calculated) : 59.3   Vital Signs: Temp: 98.5 F (36.9 C) (10/13 0537) Temp src: Oral (10/13 0537) BP: 112/67 mmHg (10/13 0537) Pulse Rate: 74  (10/13 0537) Intake/Output from previous day: 10/12 0701 - 10/13 0700 In: 851.8 [TPN:851.8] Out: 310 [Urine:300; Drains:10] Intake/Output from this shift: Total I/O In: -  Out: 300 [Urine:300]  Labs:  [facility] 01/31/12 0422  WBC 9.5  HGB 9.7*  HCT 31.7*  PLT 512*  APTT --  INR --   No results found for this basename: NA:3,K:3,CL:3,CO2:3,GLUCOSE:3,BUN:3,CREATININE:3,LABCREA:3,CREAT24HRUR:3,CALCIUM:3,MG:3,PHOS:3,PROT:3,ALBUMIN:3,AST:3,ALT:3,ALKPHOS:3,BILITOT:3,BILIDIR:3,IBILI:3,PREALBUMIN:3,TRIG:3,CHOLHDL:3,CHOL:3 in the last 72 hoursCorrected Calcium = 10 Estimated Creatinine Clearance: 81.6 ml/min (by C-G formula based on Cr of 0.51).    Basename 01/31/12 0600 01/30/12 2151 01/30/12 1650  GLUCAP 117* 124* 135*   Insulin Requirements in the past 24 hours:  CBGs < 150, required 0 units of SSI last 24h.  Nutritional Goals:  RD recs 9/16: 1762 kcal, 86-95 grams protein Reestimated s/p extubation 9/30: 1475-1775 kcal, 94-110 grams protein Clinimix 5/15 @ 69ml/hr + Lipid 20% MWF will provide 96 g protein/day, 1843 kCal on lipid days, 1363 Kcal on non-lipid days for an average of 1569 Kcal per day/week.  Current nutrition:  Diet: to advanced to reg diet 10/9. - Per RD note, patient seems to be tolerating po's but appears to choose to note eat.  Per surgery he is still having pain with eating.  mIVF: NS @ KVO Clinimix E 5/15 weaned to 40 ml/hr +  IVFE 20% @ 10 ml/hr on MWF   Assessment:  [name] with h/o recurrent pancreatitis and ETOH abuse, s/p ERCP  with sphincterotomy on 10/06/2011 here this admit with acute pancreatitis, developed ileus. Rapid response called 9/12 for respiratory decline and lethargy - pt transfer to ICU and CCM consulted. Intubated 9/12. 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   Pt underwent exploratory laparotomy with debridement of necrotic pancreas and drainage of infected, ruptured pseudocyst 9/15. TNA started 9/16.  9/30 CT abd = extensive loculated fluid in both sides of the abdomen consistent with a pseudocyst.  Additional drain placed by IR 10/1, 10/3.   TNA is now running at a weaned rate as patient works toward po intake goals  Labs based on 10/10:  Renal: Scr wnl/stable Hepatic fxn: AST/ALT improved to wnl. Electrolytes: Na remains low. Pre-Alb: 2.3 (9/17), 12.2 (9/23), 11.4 (9/30), 9.9(10/7). TG/Cholesterol: TGs ok.  CBGs: at goal, requiring minimal insulin   Plan:    Continue Clinimix E 5/15 at 40 ml/hr - Will follow up plan for today with surgery team  IV fat emulsion on MWF only due to ongoing shortage.  Tolerating oral MVI.  F/u tolerance of diet for tapering of TNA soon.  TNA labs Monday/Thursdays.   Kaston Faughn, Loma Messing PharmD Pager #: [phone] 6:36 AM 01/31/2012

## 2012-02-01 LAB — DIFFERENTIAL
Basophils Absolute: 0.1 10*3/uL (ref 0.0–0.1)
Basophils Relative: 1 % (ref 0–1)
Eosinophils Absolute: 0.3 10*3/uL (ref 0.0–0.7)
Eosinophils Relative: 3 % (ref 0–5)
Lymphs Abs: 2.8 10*3/uL (ref 0.7–4.0)
Neutrophils Relative %: 55 % (ref 43–77)

## 2012-02-01 LAB — COMPREHENSIVE METABOLIC PANEL
Alkaline Phosphatase: 119 U/L — ABNORMAL HIGH (ref 39–117)
BUN: 9 mg/dL (ref 6–23)
CO2: 25 mEq/L (ref 19–32)
Calcium: 8.9 mg/dL (ref 8.4–10.5)
GFR calc Af Amer: >90 mL/min (ref >90)
GFR calc non Af Amer: >90 mL/min (ref >90)
Glucose, Bld: 109 mg/dL — ABNORMAL HIGH (ref 70–99)
Potassium: 3.6 mEq/L (ref 3.5–5.1)
Total Protein: 7.6 g/dL (ref 6.0–8.3)

## 2012-02-01 LAB — CHOLESTEROL, TOTAL: Cholesterol: 140 mg/dL (ref 0–200)

## 2012-02-01 LAB — CBC
MCH: 28 pg (ref 26.0–34.0)
MCV: 89.5 fL (ref 78.0–100.0)
Platelets: 470 10*3/uL — ABNORMAL HIGH (ref 150–400)
RBC: 3.54 MIL/uL — ABNORMAL LOW (ref 3.87–5.11)
RDW: 16.9 % — ABNORMAL HIGH (ref 11.5–15.5)

## 2012-02-01 LAB — TRIGLYCERIDES: Triglycerides: 155 mg/dL — ABNORMAL HIGH (ref <150)

## 2012-02-01 LAB — MAGNESIUM: Magnesium: 1.8 mg/dL (ref 1.5–2.5)

## 2012-02-01 LAB — GLUCOSE, CAPILLARY: Glucose-Capillary: 114 mg/dL — ABNORMAL HIGH (ref 70–99)

## 2012-02-01 MED ORDER — CLINIMIX E/DEXTROSE (5/15) 5 % IV SOLN
INTRAVENOUS | Status: AC
  Administered 2012-02-01: 18:00:00 via INTRAVENOUS
  Filled 2012-02-01: qty 2000

## 2012-02-01 MED ORDER — FAT EMULSION 20 % IV EMUL
240.0000 mL | INTRAVENOUS | Status: AC
  Administered 2012-02-01: 240 mL via INTRAVENOUS
  Filled 2012-02-01: qty 250

## 2012-02-01 NOTE — Progress Notes (Signed)
Patient interviewed and examined, agree with PA note above.  Mariella Saa MD, FACS  02/01/2012 1:52 PM

## 2012-02-01 NOTE — Progress Notes (Signed)
Patient ID: [name], female   DOB: Feb 18, 1956, 56 y.o.   MRN: [id number] 26 Days Post-Op  Subjective: Pt feels better today after decreasing diet back to clear liquids.  Pain is less.  Objective: Vital signs in last 24 hours: Temp:  [98.2 F (36.8 C)-98.7 F (37.1 C)] 98.5 F (36.9 C) (10/14 0600) Pulse Rate:  [76-81] 76  (10/14 0600) Resp:  [18] 18  (10/14 0600) BP: (108-116)/(67-73) 108/67 mmHg (10/14 0600) SpO2:  [90 %-96 %] 90 % (10/14 0600) Last BM Date: 01/31/12  Intake/Output from previous day: 10/13 0701 - 10/14 0700 In: 934.8 [TPN:934.8] Out: 850 [Urine:850] Intake/Output this shift:    PE: Abd: soft, all drains still with tan, cloudy, purulent appearing drainage, +BS, wound is mostly clean except the superior base has some slough present.  Lab Results:   Basename 02/01/12 0530 01/31/12 0422  WBC 9.1 9.5  HGB 9.9* 9.7*  HCT 31.7* 31.7*  PLT 470* 512*   BMET  Basename 02/01/12 0530  NA 136  K 3.6  CL 101  CO2 25  GLUCOSE 109*  BUN 9  CREATININE 0.50  CALCIUM 8.9   PT/INR No results found for this basename: LABPROT:2,INR:2 in the last 72 hours CMP     Component Value Date/Time   NA 136 02/01/2012 0530   K 3.6 02/01/2012 0530   CL 101 02/01/2012 0530   CO2 25 02/01/2012 0530   GLUCOSE 109* 02/01/2012 0530   BUN 9 02/01/2012 0530   CREATININE 0.50 02/01/2012 0530   CALCIUM 8.9 02/01/2012 0530   PROT 7.6 02/01/2012 0530   ALBUMIN 2.1* 02/01/2012 0530   AST 29 02/01/2012 0530   ALT 10 02/01/2012 0530   ALKPHOS 119* 02/01/2012 0530   BILITOT 0.2* 02/01/2012 0530   GFRNONAA >90 02/01/2012 0530   GFRAA >90 02/01/2012 0530   Lipase     Component Value Date/Time   LIPASE 113* 01/11/2012 0540       Studies/Results: No results found.  Anti-infectives: Anti-infectives     Start     Dose/Rate Route Frequency Ordered Stop   01/23/12 1800  piperacillin-tazobactam (ZOSYN) IVPB 3.375 g       3.375 g 12.5 mL/hr over 240 Minutes  Intravenous Every 8 hours 01/23/12 1155 01/30/12 1452   01/03/12 1600   metroNIDAZOLE (FLAGYL) IVPB 500 mg  Status:  Discontinued        500 mg 100 mL/hr over 60 Minutes Intravenous Every 8 hours 01/03/12 1525 01/23/12 1155   12/31/11 1100   vancomycin (VANCOCIN) 750 mg in sodium chloride 0.9 % 150 mL IVPB  Status:  Discontinued        750 mg 150 mL/hr over 60 Minutes Intravenous Every 12 hours 12/31/11 0958 01/03/12 0849   12/30/11 1000   piperacillin-tazobactam (ZOSYN) IVPB 3.375 g  Status:  Discontinued        3.375 g 12.5 mL/hr over 240 Minutes Intravenous Every 8 hours 12/30/11 0901 01/23/12 1155           Assessment/Plan  1. S/p ex lap with debridement for ruptured pancreatic pseudocyst 2. PCM/TNA 3. Deconditioning  Plan: 1. Cont clear liquids and TNA. 2. SW looking into [facility], etc.  Patient will likely have to be dc on TNA as she is unable to currently advance her diet and take in enough calories to be weaned at this time. 3. Will have CM look into LTAC to see if she qualifies for this as well.   LOS: 37  days    Timeka Goette E 02/01/2012, 7:37 AM Pager: [phone]

## 2012-02-01 NOTE — Progress Notes (Signed)
Physical Therapy Treatment Patient Details Name: [name] MRN: [id number] DOB: May 26, 1955 Today's Date: 02/01/2012 Time: 4098-1191 PT Time Calculation (min): 24 min  PT Assessment / Plan / Recommendation Comments on Treatment Session  Continuing to progress well. Will plan to assess ambulation without RW on next visit (pt would like to try). Continue to recommend SNF/LTACH    Follow Up Recommendations  Post acute inpatient     Does the patient have the potential to tolerate intense rehabilitation  No, Recommend SNF/LTACH  Barriers to Discharge        Equipment Recommendations  Rolling walker with 5" wheels;3 in 1 bedside comode    Recommendations for Other Services    Frequency Min 3X/week   Plan Discharge plan remains appropriate    Precautions / Restrictions Precautions Precaution Comments: abdominal drains (3). Pt no longer has wound vac.  Restrictions Weight Bearing Restrictions: No   Pertinent Vitals/Pain Abdominal pain-medicated just prior to session    Mobility  Bed Mobility Bed Mobility: Supine to Sit Rolling Right: 5: Supervision Transfers Transfers: Sit to Stand;Stand to Sit Sit to Stand: 5: Supervision;From bed;With upper extremity assist;With armrests Stand to Sit: 5: Supervision;To bed;To chair/3-in-1;With armrests;With upper extremity assist Stand Pivot Transfers: 4: Min guard Details for Transfer Assistance: VCs safety, hand placement.  Ambulation/Gait Ambulation/Gait Assistance: 4: Min guard Ambulation Distance (Feet): 250 Feet Assistive device: Rolling walker Ambulation/Gait Assistance Details: good gait speed. good use of rw. will plan to assess ambulation without RW on next visit Gait Pattern: Decreased stride length;Step-through pattern    Exercises General Exercises - Lower Extremity Hip ABduction/ADduction: AROM;10 reps;Standing Hip Flexion/Marching: AROM;Both;Standing;10 reps Heel Raises: AROM;Both;10 reps;Standing Mini-Sqauts:  AROM;10 reps;Standing Other Exercises Other Exercises: Knee flexion, 10 reps, both, standing   PT Diagnosis:    PT Problem List:   PT Treatment Interventions:     PT Goals Acute Rehab PT Goals Pt will go Supine/Side to Sit: with modified independence PT Goal: Supine/Side to Sit - Progress: Progressing toward goal Pt will go Sit to Stand: with modified independence PT Goal: Sit to Stand - Progress: Progressing toward goal Pt will go Stand to Sit: with modified independence PT Goal: Stand to Sit - Progress: Progressing toward goal Pt will Transfer Bed to Chair/Chair to Bed: with modified independence PT Transfer Goal: Bed to Chair/Chair to Bed - Progress: Progressing toward goal Pt will Ambulate: >150 feet;with modified independence;with least restrictive assistive device PT Goal: Ambulate - Progress: Progressing toward goal  Visit Information  Last PT Received On: 02/01/12 Assistance Needed: +1    Subjective Data  Subjective: "I've been getting up to the [facility] myself" Patient Stated Goal: Less pain   Cognition  Overall Cognitive Status: Appears within functional limits for tasks assessed/performed Arousal/Alertness: Awake/alert Orientation Level: Appears intact for tasks assessed Behavior During Session: [facility] for tasks performed    Balance     End of Session PT - End of Session Activity Tolerance: Patient tolerated treatment well Patient left: in chair;with call bell/phone within reach   GP     Rebeca Alert [facility] 02/01/2012, 11:41 AM [phone]

## 2012-02-01 NOTE — Progress Notes (Addendum)
PARENTERAL NUTRITION CONSULT NOTE - FOLLOW UP  Pharmacy Consult for TNA Indication: Severe Pancreatitis  Allergies  Allergen Reactions  . Ciprofloxacin Nausea Only    REACTION: nausea   Patient Measurements: Height: 5\' 6"  (167.6 cm) Weight: 166 lb 10.7 oz (75.6 kg) IBW/kg (Calculated) : 59.3   Vital Signs: Temp: 98.5 F (36.9 C) (10/14 0600) Temp src: Oral (10/14 0600) BP: 108/67 mmHg (10/14 0600) Pulse Rate: 76  (10/14 0600) Intake/Output from previous day: 10/13 0701 - 10/14 0700 In: 934.8 [TPN:934.8] Out: 850 [Urine:850] Intake/Output from this shift:    Labs:  [facility] 02/01/12 0530 01/31/12 0422  WBC 9.1 9.5  HGB 9.9* 9.7*  HCT 31.7* 31.7*  PLT 470* 512*  APTT -- --  INR -- --    Basename 02/01/12 0531 02/01/12 0530  NA -- 136  K -- 3.6  CL -- 101  CO2 -- 25  GLUCOSE -- 109*  BUN -- 9  CREATININE -- 0.50  LABCREA -- --  CREAT24HRUR -- --  CALCIUM -- 8.9  MG -- 1.8  PHOS -- 4.3  PROT -- 7.6  ALBUMIN -- 2.1*  AST -- 29  ALT -- 10  ALKPHOS -- 119*  BILITOT -- 0.2*  BILIDIR -- --  IBILI -- --  PREALBUMIN -- --  TRIG 155* --  CHOLHDL -- --  CHOL 140 --   Estimated Creatinine Clearance: 81.6 ml/min (by C-G formula based on Cr of 0.5).    Basename 02/01/12 0554 02/01/12 0016 01/31/12 2152  GLUCAP 109* 122* 100*   Insulin Requirements in the past 24 hours:  CBGs < 150, required 2 units of SSI last 24h.  Nutritional Goals:  RD recs 9/16: 1762 kcal, 86-95 grams protein Reestimated s/p extubation 9/30: 1475-1775 kcal, 94-110 grams protein Clinimix 5/15 @ 66ml/hr + Lipid 20% MWF will provide 96 g protein/day, 1843 kCal on lipid days, 1363 Kcal on non-lipid days for an average of 1569 Kcal per day/week.  Current nutrition:  Diet: decreased back to clear liquids.  Per surgery, pain is less following diet change.  mIVF: NS @ KVO Clinimix E 5/15 weaned to 40 ml/hr +  IVFE 20% @ 10 ml/hr on MWF  Assessment:  [name] with h/o recurrent  pancreatitis and ETOH abuse, s/p ERCP with sphincterotomy on 10/06/2011 here this admit with acute pancreatitis, developed ileus. Rapid response called 9/12 for respiratory decline and lethargy - pt transfer to ICU and CCM consulted. Intubated 9/12. 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   Pt underwent exploratory laparotomy with debridement of necrotic pancreas and drainage of infected, ruptured pseudocyst 9/15. TNA started 9/16.  9/30 CT abd = extensive loculated fluid in both sides of the abdomen consistent with a pseudocyst.  Additional drain placed by IR 10/1, 10/3.  Patient is unable to tolerate advancement in diet to regular diet (continued pain with eating) and per surgery, will most likely be discharged on TNA.  TNA has been running at a weaned rate to help patient work toward po intake goals (to increase appetite).  Diet has been reduced back to clears.  Minimal PO intake recorded over past two days.  Patient refusing to eat d/t pain and nausea.  Labs based on 10/10:  Renal: Scr wnl/stable Hepatic fxn: AST/ALT wnl, alk phos trending down Electrolytes: WNL Pre-Alb: 2.3 (9/17), 12.2 (9/23), 11.4 (9/30), 9.9(10/7). TG/Cholesterol: TG mildly elevated at 155, cholesterol WNL CBGs: at goal (<150 mg/dL), requiring minimal  insulin   Plan:    Considering that patient has not been tolerating diet advancements and has had minimal PO intake over past 2 days, will increase Clinimix E 5/15 back to goal rate of 80 ml/hr for adequate nutrition support.  Per discussion with surgery, do not anticipate that patient will be receiving much nutrition from CLD so will increase TNA rate to provide 100% nutrition parenterally.    IV fat emulsion on MWF only due to ongoing shortage.  Tolerating oral MVI.  TNA labs Monday/Thursdays.  BMET in AM.  Will f/u for diet advancement and wean TNA as appropriate.   Clance Boll, PharmD, BCPS Pager: [phone] 02/01/2012 9:28 AM

## 2012-02-01 NOTE — Progress Notes (Signed)
Called [facility] and [facility], they do not take Medicaid Pending patients.

## 2012-02-02 LAB — MAGNESIUM: Magnesium: 1.7 mg/dL (ref 1.5–2.5)

## 2012-02-02 LAB — BASIC METABOLIC PANEL
CO2: 26 mEq/L (ref 19–32)
Calcium: 8.9 mg/dL (ref 8.4–10.5)
Chloride: 99 mEq/L (ref 96–112)
Potassium: 3.8 mEq/L (ref 3.5–5.1)
Sodium: 133 mEq/L — ABNORMAL LOW (ref 135–145)

## 2012-02-02 LAB — GLUCOSE, CAPILLARY
Glucose-Capillary: 116 mg/dL — ABNORMAL HIGH (ref 70–99)
Glucose-Capillary: 113 mg/dL — ABNORMAL HIGH (ref 70–99)
Glucose-Capillary: 93 mg/dL (ref 70–99)
Glucose-Capillary: 100 mg/dL — ABNORMAL HIGH (ref 70–99)

## 2012-02-02 LAB — PHOSPHORUS: Phosphorus: 4.1 mg/dL (ref 2.3–4.6)

## 2012-02-02 MED ORDER — CLINIMIX E/DEXTROSE (5/15) 5 % IV SOLN
INTRAVENOUS | Status: AC
  Administered 2012-02-02: 17:00:00 via INTRAVENOUS
  Filled 2012-02-02: qty 2000

## 2012-02-02 MED ORDER — METOCLOPRAMIDE HCL 5 MG/ML IJ SOLN
5.0000 mg | Freq: Three times a day (TID) | INTRAMUSCULAR | Status: AC
  Administered 2012-02-02 – 2012-02-05 (×9): 5 mg via INTRAVENOUS
  Filled 2012-02-02 (×3): qty 1
  Filled 2012-02-02 (×6): qty 2
  Filled 2012-02-02 (×2): qty 1
  Filled 2012-02-02: qty 2
  Filled 2012-02-02: qty 1

## 2012-02-02 NOTE — Progress Notes (Signed)
PARENTERAL NUTRITION CONSULT NOTE - FOLLOW UP  Pharmacy Consult for TNA Indication: Severe Pancreatitis  Allergies  Allergen Reactions  . Ciprofloxacin Nausea Only    REACTION: nausea   Patient Measurements: Height: 5\' 6"  (167.6 cm) Weight: 166 lb 10.7 oz (75.6 kg) IBW/kg (Calculated) : 59.3   Vital Signs: Temp: 99.2 F (37.3 C) (10/15 0608) Temp src: Oral (10/15 0608) BP: 104/66 mmHg (10/15 0608) Pulse Rate: 76  (10/15 0608) Intake/Output from previous day: 10/14 0701 - 10/15 0700 In: -  Out: 1050 [Urine:1050] Intake/Output from this shift:    Labs:  [facility] 02/01/12 0530 01/31/12 0422  WBC 9.1 9.5  HGB 9.9* 9.7*  HCT 31.7* 31.7*  PLT 470* 512*  APTT -- --  INR -- --    Basename 02/02/12 0331 02/01/12 0531 02/01/12 0530  NA 133* -- 136  K 3.8 -- 3.6  CL 99 -- 101  CO2 26 -- 25  GLUCOSE 104* -- 109*  BUN 12 -- 9  CREATININE 0.47* -- 0.50  LABCREA -- -- --  CREAT24HRUR -- -- --  CALCIUM 8.9 -- 8.9  MG 1.7 -- 1.8  PHOS 4.1 -- 4.3  PROT -- -- 7.6  ALBUMIN -- -- 2.1*  AST -- -- 29  ALT -- -- 10  ALKPHOS -- -- 119*  BILITOT -- -- 0.2*  BILIDIR -- -- --  IBILI -- -- --  PREALBUMIN -- -- 11.7*  TRIG -- 155* --  CHOLHDL -- -- --  CHOL -- 140 --   Estimated Creatinine Clearance: 81.6 ml/min (by C-G formula based on Cr of 0.47).    Basename 02/02/12 0612 02/02/12 0013 02/01/12 1649  GLUCAP 122* 116* 114*   Insulin Requirements in the past 24 hours:  CBGs < 150, required 0 units of SSI last 24h.  Nutritional Goals:  RD recs 9/16: 1762 kcal, 86-95 grams protein Reestimated s/p extubation 9/30: 1475-1775 kcal, 94-110 grams protein Clinimix 5/15 @ 62ml/hr + Lipid 20% MWF will provide 96 g protein/day, 1843 kCal on lipid days, 1363 Kcal on non-lipid days for an average of 1569 Kcal per day/week.  Current nutrition:  Diet: advancing to full liquids again today.  Patient previously could not tolerate, but pain better controlled today and patient  hungry. mIVF: NS @ KVO Clinimix E 5/15 80 ml/hr +  IVFE 20% @ 10 ml/hr on MWF  Assessment:  [name] with h/o recurrent pancreatitis and ETOH abuse, s/p ERCP with sphincterotomy on 10/06/2011 here this admit with acute pancreatitis, developed ileus. Rapid response called 9/12 for respiratory decline and lethargy - pt transfer to ICU and CCM consulted. Intubated 9/12. 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   Pt underwent exploratory laparotomy with debridement of necrotic pancreas and drainage of infected, ruptured pseudocyst 9/15. TNA started 9/16.  9/30 CT abd = extensive loculated fluid in both sides of the abdomen consistent with a pseudocyst.  Additional drain placed by IR 10/1, 10/3.  TNA had been running at a weaned rate to help patient work toward po intake goals (to increase appetite) but patient could not tolerate (d/t pain/nausea) and diet was reduced back to CLD.  TNA was increased back up to goal rate yesterday after discussion with surgery to provide 100% nutrition parenterally since did not anticipate that patient will be receiving much nutrition from CLD with her minimal intake.  Patient refusing PANDA tube for TFs.  Surgery attempting to advance diet again today  to full liquids.  If tolerates, will wean TNA rate again.  Surgery discussing possibility that patient may need to be discharged to SNF on TNA.  Renal: Scr wnl/stable Hepatic fxn: AST/ALT wnl, alk phos trending down Electrolytes: Na 133 (unable to correct with pre-mixed TNA), all other lytes WNL Pre-Alb: 2.3 (9/17), 12.2 (9/23), 11.4 (9/30), 9.9 (10/7), 11.7 (10/14) TG/Cholesterol: TG mildly elevated at 155, cholesterol WNL CBGs: at goal (<150 mg/dL), requiring minimal insulin   Plan:    Continue Clinimix E 5/15 at 80 ml/hr.  F/u if patient tolerates advancement in diet to begin wean.  IV fat emulsion on MWF only due to ongoing  shortage.  Tolerating oral MVI.  TNA labs Monday/Thursdays.  CMET in AM.   Clance Boll, PharmD, BCPS Pager: [phone] 02/02/2012 8:20 AM

## 2012-02-02 NOTE — Progress Notes (Signed)
Pt request to not receive Tylenol 1000 mg anymore. Pt stated that she no longer wants to receive due to questionable liver damage progression.  Jake Fuhrmann M

## 2012-02-02 NOTE — Progress Notes (Signed)
27 Days Post-Op  Subjective: Pt doing fairly well; still has some intermittent abd pain; no N/V;occ loose stools  Objective: Vital signs in last 24 hours: Temp:  [98.6 F (37 C)-99.2 F (37.3 C)] 99.2 F (37.3 C) (10/15 0608) Pulse Rate:  [74-76] 76  (10/15 0608) Resp:  [18-20] 18  (10/15 1610) BP: (97-112)/(54-70) 104/66 mmHg (10/15 0608) SpO2:  [92 %-94 %] 92 % (10/15 9604) Last BM Date: 02/01/12  Intake/Output from previous day: 10/14 0701 - 10/15 0700 In: -  Out: 1050 [Urine:1050] Intake/Output this shift:    Right perit drain with about 20-30 cc's in bag at present; drain flushed with 5 cc's sterile NS with return of same amt  Lab Results:   [facility] 02/01/12 0530 01/31/12 0422  WBC 9.1 9.5  HGB 9.9* 9.7*  HCT 31.7* 31.7*  PLT 470* 512*   BMET  Basename 02/02/12 0331 02/01/12 0530  NA 133* 136  K 3.8 3.6  CL 99 101  CO2 26 25  GLUCOSE 104* 109*  BUN 12 9  CREATININE 0.47* 0.50  CALCIUM 8.9 8.9   PT/INR No results found for this basename: LABPROT:2,INR:2 in the last 72 hours ABG No results found for this basename: PHART:2,PCO2:2,PO2:2,HCO3:2 in the last 72 hours  Studies/Results: No results found. Results for orders placed during the hospital encounter of 12/26/11  URINE CULTURE     Status: Normal   Collection Time   12/27/11  9:37 PM      Component Value Range Status Comment   Specimen Description URINE, RANDOM   Final    Special Requests NONE   Final    Culture  Setup Time 12/28/2011 09:31   Final    Colony Count 45,000 COLONIES/ML   Final    Culture     Final    Value: Multiple bacterial morphotypes present, none predominant. Suggest appropriate recollection if clinically indicated.   Report Status 12/29/2011 FINAL   Final   CULTURE, BLOOD (ROUTINE X 2)     Status: Normal   Collection Time   12/30/11  9:50 AM      Component Value Range Status Comment   Specimen Description BLOOD RIGHT ARM   Final    Special Requests BOTTLES DRAWN AEROBIC AND  ANAEROBIC 5CC   Final    Culture  Setup Time 12/30/2011 14:02   Final    Culture NO GROWTH 5 DAYS   Final    Report Status 01/05/2012 FINAL   Final   CULTURE, BLOOD (ROUTINE X 2)     Status: Normal   Collection Time   12/30/11 10:00 AM      Component Value Range Status Comment   Specimen Description BLOOD LEFT ARM   Final    Special Requests BOTTLES DRAWN AEROBIC AND ANAEROBIC 5CC   Final    Culture  Setup Time 12/30/2011 14:02   Final    Culture NO GROWTH 5 DAYS   Final    Report Status 01/05/2012 FINAL   Final   MRSA PCR SCREENING     Status: Normal   Collection Time   12/31/11 11:27 AM      Component Value Range Status Comment   MRSA by PCR NEGATIVE  NEGATIVE Final   URINE CULTURE     Status: Normal   Collection Time   01/01/12  9:30 PM      Component Value Range Status Comment   Specimen Description URINE, CATHETERIZED   Final    Special Requests Normal  Final    Culture  Setup Time 01/02/2012 01:51   Final    Colony Count NO GROWTH   Final    Culture NO GROWTH   Final    Report Status 01/03/2012 FINAL   Final   CULTURE, BLOOD (ROUTINE X 2)     Status: Normal   Collection Time   01/01/12 10:45 PM      Component Value Range Status Comment   Specimen Description LEFT ANTECUBITAL   Final    Special Requests BOTTLES DRAWN AEROBIC AND ANAEROBIC 3 CC EA   Final    Culture  Setup Time 01/02/2012 03:49   Final    Culture NO GROWTH 5 DAYS   Final    Report Status 01/08/2012 FINAL   Final   FUNGUS CULTURE, BLOOD     Status: Normal   Collection Time   01/01/12 10:45 PM      Component Value Range Status Comment   Specimen Description LEFT ANTECUBITAL   Final    Special Requests BOTTLES DRAWN AEROBIC ONLY 3 CC EA   Final    Culture NO GROWTH 7 DAYS   Final    Report Status 01/10/2012 FINAL   Final   CULTURE, BLOOD (ROUTINE X 2)     Status: Normal   Collection Time   01/01/12 10:55 PM      Component Value Range Status Comment   Specimen Description RIGHT ANTECUBITAL   Final     Special Requests BOTTLES DRAWN AEROBIC AND ANAEROBIC 5 CC EA   Final    Culture  Setup Time 01/02/2012 03:49   Final    Culture NO GROWTH 5 DAYS   Final    Report Status 01/08/2012 FINAL   Final   CULTURE, RESPIRATORY     Status: Normal   Collection Time   01/01/12 11:35 PM      Component Value Range Status Comment   Specimen Description TRACHEAL ASPIRATE   Final    Special Requests Normal   Final    Gram Stain     Final    Value: RARE WBC PRESENT,BOTH PMN AND MONONUCLEAR     RARE SQUAMOUS EPITHELIAL CELLS PRESENT     RARE GRAM POSITIVE COCCI IN PAIRS   Culture Non-Pathogenic Oropharyngeal-type Flora Isolated.   Final    Report Status 01/04/2012 FINAL   Final   VRE CULTURE     Status: Normal   Collection Time   01/02/12  3:07 AM      Component Value Range Status Comment   Specimen Description RECTAL SWAB   Final    Special Requests Normal   Final    Culture NEGATIVE FOR VANCOMYCIN RESISTANT ENTEROCOCCI   Final    Report Status 01/06/2012 FINAL   Final   CLOSTRIDIUM DIFFICILE BY PCR     Status: Abnormal   Collection Time   01/02/12  4:54 PM      Component Value Range Status Comment   C difficile by pcr POSITIVE (*) NEGATIVE Final   BODY FLUID CULTURE     Status: Normal   Collection Time   01/03/12 10:00 AM      Component Value Range Status Comment   Specimen Description ASCITIC   Final    Special Requests NONE   Final    Gram Stain     Final    Value: NO WBC SEEN     NO ORGANISMS SEEN   Culture NO GROWTH 3 DAYS   Final  Report Status 01/06/2012 FINAL   Final   BODY FLUID CULTURE     Status: Normal   Collection Time   01/03/12  1:51 PM      Component Value Range Status Comment   Specimen Description PERITONEAL   Final    Special Requests NONE   Final    Gram Stain     Final    Value: NO WBC SEEN     NO ORGANISMS SEEN   Culture NO GROWTH 3 DAYS   Final    Report Status 01/07/2012 FINAL   Final   ANAEROBIC CULTURE     Status: Normal   Collection Time   01/03/12  1:52 PM        Component Value Range Status Comment   Specimen Description PERITONEAL FLUID   Final    Special Requests NONE   Final    Gram Stain     Final    Value: NO WBC SEEN     NO ORGANISMS SEEN   Culture NO ANAEROBES ISOLATED   Final    Report Status 01/08/2012 FINAL   Final   CULTURE, BLOOD (ROUTINE X 2)     Status: Normal   Collection Time   01/04/12  2:55 PM      Component Value Range Status Comment   Specimen Description BLOOD LEFT HAND   Final    Special Requests BOTTLES DRAWN AEROBIC AND ANAEROBIC 5.5CC   Final    Culture  Setup Time 01/04/2012 23:02   Final    Culture NO GROWTH 5 DAYS   Final    Report Status 01/10/2012 FINAL   Final   CULTURE, BLOOD (ROUTINE X 2)     Status: Normal   Collection Time   01/04/12  3:00 PM      Component Value Range Status Comment   Specimen Description BLOOD LEFT HAND   Final    Special Requests BOTTLES DRAWN AEROBIC AND ANAEROBIC 5.5CC   Final    Culture  Setup Time 01/04/2012 23:29   Final    Culture NO GROWTH 5 DAYS   Final    Report Status 01/10/2012 FINAL   Final   CULTURE, RESPIRATORY     Status: Normal   Collection Time   01/04/12  3:37 PM      Component Value Range Status Comment   Specimen Description ENDOTRACHEAL   Final    Special Requests NONE   Final    Gram Stain     Final    Value: RARE WBC PRESENT, PREDOMINANTLY MONONUCLEAR     NO SQUAMOUS EPITHELIAL CELLS SEEN     NO ORGANISMS SEEN   Culture Non-Pathogenic Oropharyngeal-type Flora Isolated.   Final    Report Status 01/07/2012 FINAL   Final   URINE CULTURE     Status: Normal   Collection Time   01/04/12  4:59 PM      Component Value Range Status Comment   Specimen Description URINE, CATHETERIZED   Final    Special Requests zosyn,flagyl   Final    Culture  Setup Time 01/05/2012 02:21   Final    Colony Count NO GROWTH   Final    Culture NO GROWTH   Final    Report Status 01/05/2012 FINAL   Final   ANAEROBIC CULTURE     Status: Normal   Collection Time   01/19/12  2:00 PM       Component Value Range Status Comment   Specimen Description PERITONEAL CAVITY  Final    Special Requests Normal   Final    Gram Stain     Final    Value: FEW WBC PRESENT,BOTH PMN AND MONONUCLEAR     NO ORGANISMS SEEN   Culture NO ANAEROBES ISOLATED   Final    Report Status 01/24/2012 FINAL   Final   BODY FLUID CULTURE     Status: Normal   Collection Time   01/19/12  2:00 PM      Component Value Range Status Comment   Specimen Description FLUID PERITONEAL CAVITY   Final    Special Requests NONE   Final    Gram Stain     Final    Value: FEW WBC PRESENT,BOTH PMN AND MONONUCLEAR     NO ORGANISMS SEEN   Culture NO GROWTH 3 DAYS   Final    Report Status 01/23/2012 FINAL   Final   CLOSTRIDIUM DIFFICILE BY PCR     Status: Normal   Collection Time   01/24/12 10:05 AM      Component Value Range Status Comment   C difficile by pcr NEGATIVE  NEGATIVE Final     Anti-infectives: Anti-infectives     Start     Dose/Rate Route Frequency Ordered Stop   01/23/12 1800  piperacillin-tazobactam (ZOSYN) IVPB 3.375 g       3.375 g 12.5 mL/hr over 240 Minutes Intravenous Every 8 hours 01/23/12 1155 01/30/12 1452   01/03/12 1600   metroNIDAZOLE (FLAGYL) IVPB 500 mg  Status:  Discontinued        500 mg 100 mL/hr over 60 Minutes Intravenous Every 8 hours 01/03/12 1525 01/23/12 1155   12/31/11 1100   vancomycin (VANCOCIN) 750 mg in sodium chloride 0.9 % 150 mL IVPB  Status:  Discontinued        750 mg 150 mL/hr over 60 Minutes Intravenous Every 12 hours 12/31/11 0958 01/03/12 0849   12/30/11 1000   piperacillin-tazobactam (ZOSYN) IVPB 3.375 g  Status:  Discontinued        3.375 g 12.5 mL/hr over 240 Minutes Intravenous Every 8 hours 12/30/11 0901 01/23/12 1155          Assessment/Plan: s/p right peritoneal fluid collection drainage 10/1 (last CT 10/9); would recheck CT in next 2-3 days to assess adequacy of drainage   LOS: 38 days    Halley Shepheard,D [facility] 02/02/2012

## 2012-02-02 NOTE — Progress Notes (Signed)
Occupational Therapy Treatment Patient Details Name: [name] MRN: [id number] DOB: [birth year] Today's Date: 02/02/2012 Time: 4098-1191 OT Time Calculation (min): 16 min  OT Assessment / Plan / Recommendation Comments on Treatment Session Pt demonstrated proper form with theraband for posterior delts and triceps.      Follow Up Recommendations  Skilled nursing facility    Barriers to Discharge       Equipment Recommendations  Rolling walker with 5" wheels;3 in 1 bedside comode    Recommendations for Other Services    Frequency     Plan      Precautions / Restrictions Precautions Precautions: Fall Restrictions Weight Bearing Restrictions: No   Pertinent Vitals/Pain Sore abdomen; repositioned    ADL  Grooming: Performed;Wash/dry hands;Supervision/safety Where Assessed - Grooming: Unsupported standing Toilet Transfer: Research scientist (life sciences) Method: Sit to Barista: Raised toilet seat with arms (or 3-in-1 over toilet) Toileting - Clothing Manipulation and Hygiene: Performed;Independent Where Assessed - Toileting Clothing Manipulation and Hygiene: Sit on 3-in-1 or toilet Transfers/Ambulation Related to ADLs: ambulated to bathroom with supervision    OT Diagnosis:    OT Problem List:   OT Treatment Interventions:     OT Goals Acute Rehab OT Goals Time For Goal Achievement: 02/10/12 ADL Goals Pt Will Perform Grooming: with modified independence;Standing at sink ADL Goal: Grooming - Progress: Progressing toward goals Pt Will Transfer to Toilet: with modified independence;Comfort height toilet;3-in-1;Ambulation ADL Goal: Toilet Transfer - Progress: Progressing toward goals Pt Will Perform Toileting - Hygiene: with modified independence ADL Goal: Toileting - Hygiene - Progress: Met Arm Goals Pt Will Complete Theraband Exer: with supervision, verbal cues required/provided;Bilateral upper extremities;to increase  strength;1 set;10 reps;Level 1 Theraband Arm Goal: Theraband Exercises - Progress: Met (pt independent with 2 exercises given)  Visit Information  Last OT Received On: 02/02/12 Assistance Needed: +1    Subjective Data      Prior Functioning       Cognition  Overall Cognitive Status: Appears within functional limits for tasks assessed/performed Arousal/Alertness: Awake/alert Orientation Level: Appears intact for tasks assessed Behavior During Session: [facility] for tasks performed    Mobility  Shoulder Instructions Bed Mobility Bed Mobility: Supine to Sit Rolling Right: 5: Supervision Transfers Sit to Stand: 5: Supervision;From toilet;From bed       Exercises  General Exercises - Upper Extremity Shoulder Extension: Theraband;10 reps Elbow Extension: Theraband;10 reps   Balance     End of Session OT - End of Session Activity Tolerance: Patient tolerated treatment well Patient left: in bed;with call bell/phone within reach  GO     Marcellene Shivley 02/02/2012, 4:12 PM [name], OTR/L [phone] 02/02/2012

## 2012-02-02 NOTE — Progress Notes (Signed)
CSW continuing to follow for discharge planning. Patient information has been sent to [facility]. Patient is progressing in physical therapy. Patient has applied for medicaid.  Niclas Markell C. Janika Jedlicka MSW, LCSW [phone]

## 2012-02-02 NOTE — Progress Notes (Signed)
Patient ID: [name], female   DOB: 1956/03/31, 56 y.o.   MRN: [id number] 27 Days Post-Op  Subjective: Pt feels better today.  Pain better controlled.  Got hungry yesterday.  Objective: Vital signs in last 24 hours: Temp:  [98.6 F (37 C)-99.2 F (37.3 C)] 99.2 F (37.3 C) (10/15 0608) Pulse Rate:  [74-76] 76  (10/15 0608) Resp:  [18-20] 18  (10/15 9562) BP: (97-112)/(54-70) 104/66 mmHg (10/15 0608) SpO2:  [92 %-94 %] 92 % (10/15 1308) Last BM Date: 02/01/12  Intake/Output from previous day: 10/14 0701 - 10/15 0700 In: -  Out: 1050 [Urine:1050] Intake/Output this shift:    PE: Abd: soft, stable, all drains with tan cloudy output, minimal output currently as they have just been emptied.  Lab Results:   Basename 02/01/12 0530 01/31/12 0422  WBC 9.1 9.5  HGB 9.9* 9.7*  HCT 31.7* 31.7*  PLT 470* 512*   BMET  Basename 02/02/12 0331 02/01/12 0530  NA 133* 136  K 3.8 3.6  CL 99 101  CO2 26 25  GLUCOSE 104* 109*  BUN 12 9  CREATININE 0.47* 0.50  CALCIUM 8.9 8.9   PT/INR No results found for this basename: LABPROT:2,INR:2 in the last 72 hours CMP     Component Value Date/Time   NA 133* 02/02/2012 0331   K 3.8 02/02/2012 0331   CL 99 02/02/2012 0331   CO2 26 02/02/2012 0331   GLUCOSE 104* 02/02/2012 0331   BUN 12 02/02/2012 0331   CREATININE 0.47* 02/02/2012 0331   CALCIUM 8.9 02/02/2012 0331   PROT 7.6 02/01/2012 0530   ALBUMIN 2.1* 02/01/2012 0530   AST 29 02/01/2012 0530   ALT 10 02/01/2012 0530   ALKPHOS 119* 02/01/2012 0530   BILITOT 0.2* 02/01/2012 0530   GFRNONAA >90 02/02/2012 0331   GFRAA >90 02/02/2012 0331   Lipase     Component Value Date/Time   LIPASE 113* 01/11/2012 0540       Studies/Results: No results found.  Anti-infectives: Anti-infectives     Start     Dose/Rate Route Frequency Ordered Stop   01/23/12 1800  piperacillin-tazobactam (ZOSYN) IVPB 3.375 g       3.375 g 12.5 mL/hr over 240 Minutes Intravenous Every 8  hours 01/23/12 1155 01/30/12 1452   01/03/12 1600   metroNIDAZOLE (FLAGYL) IVPB 500 mg  Status:  Discontinued        500 mg 100 mL/hr over 60 Minutes Intravenous Every 8 hours 01/03/12 1525 01/23/12 1155   12/31/11 1100   vancomycin (VANCOCIN) 750 mg in sodium chloride 0.9 % 150 mL IVPB  Status:  Discontinued        750 mg 150 mL/hr over 60 Minutes Intravenous Every 12 hours 12/31/11 0958 01/03/12 0849   12/30/11 1000   piperacillin-tazobactam (ZOSYN) IVPB 3.375 g  Status:  Discontinued        3.375 g 12.5 mL/hr over 240 Minutes Intravenous Every 8 hours 12/30/11 0901 01/23/12 1155           Assessment/Plan  1. Multiple abdominal fluid collections, s/p ex lap for ruptured pancreatic pseudocyst 2. PCM/TNA  Plan: 1. D/w patient the possibility of placing a PANDA tube for TFs as this would be a better way to feed her.  She refuses this.  She has done this before, several years ago, and hated it. 2. Will try to advance diet to full liquids today and see how she does.  She is feeling better today. 3. Drainage output  is not being documented.  No output documented since 10-13.  These fluids collections were big enough that she should still be having some output from these drains.  Will check and make sure the IR drain is being flushed. 4. If patient unable to fully come off of TNA, will need to be dc to SNF on TNA.   5. Will dc octreotide today and see how her drainage output does.  Will add Reglan.  LOS: 38 days    [name] E 02/02/2012, 8:04 AM Pager: [phone]

## 2012-02-02 NOTE — Progress Notes (Signed)
Patient interviewed and examined, agree with PA note above.  Mariella Saa MD, FACS  02/02/2012 12:41 PM

## 2012-02-02 NOTE — Progress Notes (Addendum)
Physical Therapy Treatment Patient Details Name: [name] MRN: [id number] DOB: 23-Sep-1955 Today's Date: 02/02/2012 Time: 7829-5621 PT Time Calculation (min): 24 min  PT Assessment / Plan / Recommendation Comments on Treatment Session  Progressed to ambulation without RW this session. Pt did require 1 hand support, so used IV pole. Unsteady at times but no LOB. Deferred exercises this session due to pt c/o some increased pain.     Follow Up Recommendations  Post acute inpatient     Does the patient have the potential to tolerate intense rehabilitation  No, Recommend SNF  Barriers to Discharge        Equipment Recommendations  Rolling walker with 5" wheels;3 in 1 bedside comode    Recommendations for Other Services OT consult  Frequency Min 3X/week   Plan Discharge plan remains appropriate    Precautions / Restrictions Precautions Precautions: Fall Restrictions Weight Bearing Restrictions: No   Pertinent Vitals/Pain Abdominal pain-7/10    Mobility  Bed Mobility Bed Mobility: Supine to Sit Supine to Sit: 5: Supervision Details for Bed Mobility Assistance: Supervision for line/drain management Transfers Transfers: Sit to Stand;Stand to Sit Sit to Stand: 5: Supervision;From toilet;From bed Stand to Sit: 5: Supervision;To toilet;To chair/3-in-1 Details for Transfer Assistance: x 2. VCS safety, hand placement Ambulation/Gait Ambulation/Gait Assistance: 4: Min assist Ambulation Distance (Feet): 350 Feet Assistive device: 1 person hand held assist Ambulation/Gait Assistance Details: Pt held onto IV pole while ambulating this session. VCs safety, pacing, step length. NO LOB. Assist to stabilize and maneuver with IV pole intermittently Gait Pattern: Decreased stride length;Decreased step length - right;Decreased step length - left    Exercises     PT Diagnosis:    PT Problem List:   PT Treatment Interventions:     PT Goals Acute Rehab PT Goals Pt will go  Supine/Side to Sit: with modified independence PT Goal: Supine/Side to Sit - Progress: Progressing toward goal Pt will go Sit to Stand: with modified independence PT Goal: Sit to Stand - Progress: Progressing toward goal Pt will go Stand to Sit: with modified independence PT Goal: Stand to Sit - Progress: Progressing toward goal Pt will Ambulate: >150 feet;with modified independence;with least restrictive assistive device PT Goal: Ambulate - Progress: Progressing toward goal  Visit Information  Last PT Received On: 02/02/12 Assistance Needed: +1    Subjective Data  Subjective: "My stomach is just hurting some today" Patient Stated Goal: Less pain.    Cognition  Overall Cognitive Status: Appears within functional limits for tasks assessed/performed Arousal/Alertness: Awake/alert Orientation Level: Appears intact for tasks assessed Behavior During Session: [facility] for tasks performed    Balance     End of Session PT - End of Session Activity Tolerance: Patient limited by pain Patient left: in chair;with call bell/phone within reach   GP     Rebeca Alert [facility] 02/02/2012, 9:55 AM [phone]

## 2012-02-03 LAB — GLUCOSE, CAPILLARY: Glucose-Capillary: 104 mg/dL — ABNORMAL HIGH (ref 70–99)

## 2012-02-03 LAB — COMPREHENSIVE METABOLIC PANEL
ALT: 9 U/L (ref 0–35)
AST: 24 U/L (ref 0–37)
Albumin: 2.2 g/dL — ABNORMAL LOW (ref 3.5–5.2)
Calcium: 9.1 mg/dL (ref 8.4–10.5)
GFR calc Af Amer: >90 mL/min (ref >90)
Sodium: 134 mEq/L — ABNORMAL LOW (ref 135–145)
Total Protein: 7.7 g/dL (ref 6.0–8.3)

## 2012-02-03 MED ORDER — OXYCODONE HCL 5 MG PO TABS
5.0000 mg | ORAL_TABLET | ORAL | Status: DC | PRN
  Administered 2012-02-04 – 2012-02-06 (×10): 10 mg via ORAL
  Administered 2012-02-06: 5 mg via ORAL
  Administered 2012-02-06 – 2012-02-09 (×15): 10 mg via ORAL
  Filled 2012-02-03 (×16): qty 2
  Filled 2012-02-03: qty 1
  Filled 2012-02-03 (×4): qty 2
  Filled 2012-02-03: qty 1
  Filled 2012-02-03 (×6): qty 2

## 2012-02-03 MED ORDER — INSULIN ASPART 100 UNIT/ML ~~LOC~~ SOLN: 0.0000 [IU] | Freq: Every day | SUBCUTANEOUS | Status: DC

## 2012-02-03 MED ORDER — CLINIMIX E/DEXTROSE (5/15) 5 % IV SOLN
INTRAVENOUS | Status: AC
  Administered 2012-02-03: 18:00:00 via INTRAVENOUS
  Filled 2012-02-03: qty 2000

## 2012-02-03 MED ORDER — FAT EMULSION 20 % IV EMUL
250.0000 mL | INTRAVENOUS | Status: AC
  Administered 2012-02-03: 250 mL via INTRAVENOUS
  Filled 2012-02-03: qty 250

## 2012-02-03 MED ORDER — INSULIN ASPART 100 UNIT/ML ~~LOC~~ SOLN: 0.0000 [IU] | Freq: Three times a day (TID) | SUBCUTANEOUS | Status: DC

## 2012-02-03 NOTE — Progress Notes (Signed)
CSW met with patient. Patient is alert and oriented X3. CSW discussed the possibility of patient learning to handle drains herself so that she can go home instead of SNF, as patient is walking 250 feet. Patient states that there is no way she can handle herself at home as she has no strength. CSW stated that patient walked 250 feet so that she is getting some of her strength back. Patient states that she still has an IV in. CSW explained that that will either be discontinued prior to discharge or that it can be managed at home. Patient continues to be adamant about the fact that she can not manage herself at home.  [name] C. Viyan Rosamond MSW, LCSW [phone]

## 2012-02-03 NOTE — Progress Notes (Signed)
Patient ID: [name], female   DOB: 1955-05-05, 56 y.o.   MRN: [id number] 28 Days Post-Op  Subjective: Pt tolerated full liquids well yesterday.  No increase in pain.  Slight nausea, but nothing significant.  Pain well controlled with fentanyl patch  Objective: Vital signs in last 24 hours: Temp:  [98.3 F (36.8 C)-98.7 F (37.1 C)] 98.7 F (37.1 C) (10/16 0626) Pulse Rate:  [81-87] 82  (10/16 0626) Resp:  [18-19] 18  (10/16 0626) BP: (101-114)/(59-74) 114/74 mmHg (10/16 0626) SpO2:  [92 %-96 %] 92 % (10/16 0626) Last BM Date: 02/01/12  Intake/Output from previous day: 10/15 0701 - 10/16 0700 In: 2011 [I.V.:380; IV Piggyback:31; TPN:1600] Out: 465 [Urine:450; Drains:15] Intake/Output this shift:    PE: Abd: soft, NT, ND, wound is stable, JPs 1 and 2 with scant tan cloudy drainage, gravity bag with serosang output.  Lab Results:   Basename 02/01/12 0530  WBC 9.1  HGB 9.9*  HCT 31.7*  PLT 470*   BMET  Basename 02/03/12 0445 02/02/12 0331  NA 134* 133*  K 3.6 3.8  CL 100 99  CO2 26 26  GLUCOSE 110* 104*  BUN 11 12  CREATININE 0.48* 0.47*  CALCIUM 9.1 8.9   PT/INR No results found for this basename: LABPROT:2,INR:2 in the last 72 hours CMP     Component Value Date/Time   NA 134* 02/03/2012 0445   K 3.6 02/03/2012 0445   CL 100 02/03/2012 0445   CO2 26 02/03/2012 0445   GLUCOSE 110* 02/03/2012 0445   BUN 11 02/03/2012 0445   CREATININE 0.48* 02/03/2012 0445   CALCIUM 9.1 02/03/2012 0445   PROT 7.7 02/03/2012 0445   ALBUMIN 2.2* 02/03/2012 0445   AST 24 02/03/2012 0445   ALT 9 02/03/2012 0445   ALKPHOS 114 02/03/2012 0445   BILITOT 0.2* 02/03/2012 0445   GFRNONAA >90 02/03/2012 0445   GFRAA >90 02/03/2012 0445   Lipase     Component Value Date/Time   LIPASE 113* 01/11/2012 0540       Studies/Results: No results found.  Anti-infectives: Anti-infectives     Start     Dose/Rate Route Frequency Ordered Stop   01/23/12 1800   piperacillin-tazobactam (ZOSYN) IVPB 3.375 g       3.375 g 12.5 mL/hr over 240 Minutes Intravenous Every 8 hours 01/23/12 1155 01/30/12 1452   01/03/12 1600   metroNIDAZOLE (FLAGYL) IVPB 500 mg  Status:  Discontinued        500 mg 100 mL/hr over 60 Minutes Intravenous Every 8 hours 01/03/12 1525 01/23/12 1155   12/31/11 1100   vancomycin (VANCOCIN) 750 mg in sodium chloride 0.9 % 150 mL IVPB  Status:  Discontinued        750 mg 150 mL/hr over 60 Minutes Intravenous Every 12 hours 12/31/11 0958 01/03/12 0849   12/30/11 1000   piperacillin-tazobactam (ZOSYN) IVPB 3.375 g  Status:  Discontinued        3.375 g 12.5 mL/hr over 240 Minutes Intravenous Every 8 hours 12/30/11 0901 01/23/12 1155           Assessment/Plan  1. Multiple abdominal fluid collections with drains in place 2. S/p ex lap with pancreatic debridement 3. PCM/TNA  Plan: 1. Cont TNA 2. Cont full liquids today and see how she continues to do.  If well, will look at trying solid food again tomorrow. 3. Cont pain patch as this seems to be helping her pain quite well. 4. Plan repeat CT scan  next week 5. Cont to record her drain outputs accurately.   LOS: 39 days    Edie Darley E 02/03/2012, 7:39 AM Pager: [phone]

## 2012-02-03 NOTE — Progress Notes (Signed)
Physical Therapy Treatment Patient Details Name: [name] MRN: [id number] DOB: 1955/09/18 Today's Date: 02/03/2012 Time: 1215-1232 PT Time Calculation (min): 17 min  PT Assessment / Plan / Recommendation Comments on Treatment Session  Increased gait distance to 375' pushing IV pole, try cane next visit. Progressing well. Abdominal pain limits activity tolerance.     Follow Up Recommendations  Post acute inpatient     Does the patient have the potential to tolerate intense rehabilitation  No, Recommend SNF  Barriers to Discharge        Equipment Recommendations  Rolling walker with 5" wheels;3 in 1 bedside comode    Recommendations for Other Services OT consult  Frequency Min 3X/week   Plan Discharge plan remains appropriate    Precautions / Restrictions Precautions Precautions: Fall Restrictions Weight Bearing Restrictions: No   Pertinent Vitals/Pain **6/10 abdominal pain with walking Pt stated she had pain meds earlier this morning.*    Mobility  Transfers Transfers: Stand to Sit;Sit to Stand Sit to Stand: 6: Modified independent (Device/Increase time);From chair/3-in-1;With armrests;With upper extremity assist Stand to Sit: 6: Modified independent (Device/Increase time);To chair/3-in-1;With upper extremity assist;With armrests Ambulation/Gait Ambulation/Gait Assistance: 5: Supervision Ambulation Distance (Feet): 375 Feet Assistive device: Other (Comment) (IV pole LUE) Gait velocity: WFL General Gait Details: supervision 2* pt reported feeling "wobbly", no LOB    Exercises     PT Diagnosis:    PT Problem List:   PT Treatment Interventions:     PT Goals Acute Rehab PT Goals Pt will go Supine/Side to Sit: with modified independence Pt will go Sit to Stand: with modified independence PT Goal: Sit to Stand - Progress: Met Pt will go Stand to Sit: with modified independence PT Goal: Stand to Sit - Progress: Met Pt will Transfer Bed to Chair/Chair to  Bed: with modified independence Pt will Ambulate: >150 feet;with modified independence;with least restrictive assistive device PT Goal: Ambulate - Progress: Progressing toward goal  Visit Information  Last PT Received On: 02/03/12 Assistance Needed: +1    Subjective Data  Subjective: My stomach muscles are really weak, but my legs feel stronger.    Cognition  Overall Cognitive Status: Appears within functional limits for tasks assessed/performed Arousal/Alertness: Awake/alert Orientation Level: Appears intact for tasks assessed Behavior During Session: [facility] for tasks performed    Balance     End of Session PT - End of Session Activity Tolerance: Patient limited by pain Patient left: in chair;with call bell/phone within reach   GP     Tamala Ser 02/03/2012, 12:38 PM [phone]

## 2012-02-03 NOTE — Progress Notes (Signed)
PARENTERAL NUTRITION CONSULT NOTE - FOLLOW UP  Pharmacy Consult for TNA Indication: Severe Pancreatitis  Allergies  Allergen Reactions  . Ciprofloxacin Nausea Only    REACTION: nausea   Patient Measurements: Height: 5\' 6"  (167.6 cm) Weight: 166 lb 10.7 oz (75.6 kg) IBW/kg (Calculated) : 59.3   Vital Signs: Temp: 98.7 F (37.1 C) (10/16 0626) Temp src: Oral (10/16 0626) BP: 114/74 mmHg (10/16 0626) Pulse Rate: 82  (10/16 0626) Intake/Output from previous day: 10/15 0701 - 10/16 0700 In: 2011 [I.V.:380; IV Piggyback:31; TPN:1600] Out: 465 [Urine:450; Drains:15] Intake/Output from this shift:    Labs:  [facility] 02/01/12 0530  WBC 9.1  HGB 9.9*  HCT 31.7*  PLT 470*  APTT --  INR --    Basename 02/03/12 0445 02/02/12 0331 02/01/12 0531 02/01/12 0530  NA 134* 133* -- 136  K 3.6 3.8 -- 3.6  CL 100 99 -- 101  CO2 26 26 -- 25  GLUCOSE 110* 104* -- 109*  BUN 11 12 -- 9  CREATININE 0.48* 0.47* -- 0.50  LABCREA -- -- -- --  CREAT24HRUR -- -- -- --  CALCIUM 9.1 8.9 -- 8.9  MG -- 1.7 -- 1.8  PHOS -- 4.1 -- 4.3  PROT 7.7 -- -- 7.6  ALBUMIN 2.2* -- -- 2.1*  AST 24 -- -- 29  ALT 9 -- -- 10  ALKPHOS 114 -- -- 119*  BILITOT 0.2* -- -- 0.2*  BILIDIR -- -- -- --  IBILI -- -- -- --  PREALBUMIN -- -- -- 11.7*  TRIG -- -- 155* --  CHOLHDL -- -- -- --  CHOL -- -- 140 --   Estimated Creatinine Clearance: 81.6 ml/min (by C-G formula based on Cr of 0.48).    Basename 02/02/12 2133 02/02/12 1647 02/02/12 1147  GLUCAP 104* 100* 93   Insulin Requirements in the past 24 hours:  CBGs < 150, required 0 units of SSI last 24h.  Nutritional Goals:  RD recs 9/16: 1762 kcal, 86-95 grams protein Reestimated s/p extubation 9/30: 1475-1775 kcal, 94-110 grams protein Clinimix 5/15 @ 82ml/hr + Lipid 20% MWF will provide 96 g protein/day, 1843 kCal on lipid days, 1363 Kcal on non-lipid days for an average of 1569 Kcal per day/week.  Current nutrition:  Diet: advancing to full  liquids again today.  Patient previously could not tolerate, but pain better controlled today and patient hungry. mIVF: NS @ KVO Clinimix E 5/15 80 ml/hr +  IVFE 20% @ 10 ml/hr on MWF  Assessment:  [name] with h/o recurrent pancreatitis and ETOH abuse, s/p ERCP with sphincterotomy on 10/06/2011 here this admit with acute pancreatitis, developed ileus. Rapid response called 9/12 for respiratory decline and lethargy - pt transfer to ICU and CCM consulted. Intubated 9/12. 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   Pt underwent exploratory laparotomy with debridement of necrotic pancreas and drainage of infected, ruptured pseudocyst 9/15. TNA started 9/16.  9/30 CT abd = extensive loculated fluid in both sides of the abdomen consistent with a pseudocyst.  Additional drain placed by IR 10/1, 10/3.  TNA had been running at a weaned rate to help patient work toward po intake goals (to increase appetite) but patient could not tolerate (d/t pain/nausea) and diet was reduced back to CLD.  TNA was increased back up to goal rate 10/14 after discussion with surgery to provide 100% nutrition parenterally since did not anticipate that patient will be receiving much  nutrition from CLD with her minimal intake.  Patient refusing PANDA tube for TFs.  Surgery attempting to advance diet again on 10/15 to full liquids. May possibly try solids 10/17 if continues to do OK on fulls. IIf tolerates, will wean TNA rate.  Surgery discussing possibility that patient may need to be discharged to SNF on TNA.  Renal: Scr wnl/stable Hepatic fxn: AST/ALT wnl, alk phos trending down Electrolytes: Na 134 (unable to correct with pre-mixed TNA), Corr Ca+ = 10.5 (at ULN) Pre-Alb: 2.3 (9/17), 12.2 (9/23), 11.4 (9/30), 9.9 (10/7), 11.7 (10/14) TG/Cholesterol: TG mildly elevated at 155, cholesterol WNL CBGs: at goal (<150 mg/dL), requiring minimal  insulin   Plan:    Continue Clinimix E 5/15 at 80 ml/hr as oral intake inadequate  IV fat emulsion on MWF only due to ongoing shortage.  Tolerating oral vitamin + minerals thus no MVI or TE in TPN  TNA labs Monday/Thursdays.    Juliette Alcide, PharmD, BCPS.   Pager: [phone] 02/03/2012 7:09 AM

## 2012-02-03 NOTE — Progress Notes (Addendum)
28 Days Post-Op  Subjective: Pt doing fair; still has intermittent bouts of abd pain , sometimes mod in degree; tol liquids ok, more abd pain with solids  Objective: Vital signs in last 24 hours: Temp:  [98.3 F (36.8 C)-98.7 F (37.1 C)] 98.7 F (37.1 C) (10/16 1559) Pulse Rate:  [82-87] 86  (10/16 1559) Resp:  [18] 18  (10/16 1559) BP: (102-114)/(66-74) 102/66 mmHg (10/16 1559) SpO2:  [91 %-94 %] 91 % (10/16 1559) Last BM Date: 02/01/12  Intake/Output from previous day: 10/15 0701 - 10/16 0700 In: 2011 [I.V.:380; IV Piggyback:31; TPN:1600] Out: 465 [Urine:450; Drains:15] Intake/Output this shift: Total I/O In: -  Out: 400 [Urine:400]  Rt perit drain intact, output minimal today; drain flushed with 5 cc's sterile NS with return of same amt  Lab Results:   Basename 02/01/12 0530  WBC 9.1  HGB 9.9*  HCT 31.7*  PLT 470*   BMET  Basename 02/03/12 0445 02/02/12 0331  NA 134* 133*  K 3.6 3.8  CL 100 99  CO2 26 26  GLUCOSE 110* 104*  BUN 11 12  CREATININE 0.48* 0.47*  CALCIUM 9.1 8.9   PT/INR No results found for this basename: LABPROT:2,INR:2 in the last 72 hours ABG No results found for this basename: PHART:2,PCO2:2,PO2:2,HCO3:2 in the last 72 hours Results for orders placed during the hospital encounter of 12/26/11  URINE CULTURE     Status: Normal   Collection Time   12/27/11  9:37 PM      Component Value Range Status Comment   Specimen Description URINE, RANDOM   Final    Special Requests NONE   Final    Culture  Setup Time 12/28/2011 09:31   Final    Colony Count 45,000 COLONIES/ML   Final    Culture     Final    Value: Multiple bacterial morphotypes present, none predominant. Suggest appropriate recollection if clinically indicated.   Report Status 12/29/2011 FINAL   Final   CULTURE, BLOOD (ROUTINE X 2)     Status: Normal   Collection Time   12/30/11  9:50 AM      Component Value Range Status Comment   Specimen Description BLOOD RIGHT ARM   Final    Special Requests BOTTLES DRAWN AEROBIC AND ANAEROBIC 5CC   Final    Culture  Setup Time 12/30/2011 14:02   Final    Culture NO GROWTH 5 DAYS   Final    Report Status 01/05/2012 FINAL   Final   CULTURE, BLOOD (ROUTINE X 2)     Status: Normal   Collection Time   12/30/11 10:00 AM      Component Value Range Status Comment   Specimen Description BLOOD LEFT ARM   Final    Special Requests BOTTLES DRAWN AEROBIC AND ANAEROBIC 5CC   Final    Culture  Setup Time 12/30/2011 14:02   Final    Culture NO GROWTH 5 DAYS   Final    Report Status 01/05/2012 FINAL   Final   MRSA PCR SCREENING     Status: Normal   Collection Time   12/31/11 11:27 AM      Component Value Range Status Comment   MRSA by PCR NEGATIVE  NEGATIVE Final   URINE CULTURE     Status: Normal   Collection Time   01/01/12  9:30 PM      Component Value Range Status Comment   Specimen Description URINE, CATHETERIZED   Final    Special Requests  Normal   Final    Culture  Setup Time 01/02/2012 01:51   Final    Colony Count NO GROWTH   Final    Culture NO GROWTH   Final    Report Status 01/03/2012 FINAL   Final   CULTURE, BLOOD (ROUTINE X 2)     Status: Normal   Collection Time   01/01/12 10:45 PM      Component Value Range Status Comment   Specimen Description LEFT ANTECUBITAL   Final    Special Requests BOTTLES DRAWN AEROBIC AND ANAEROBIC 3 CC EA   Final    Culture  Setup Time 01/02/2012 03:49   Final    Culture NO GROWTH 5 DAYS   Final    Report Status 01/08/2012 FINAL   Final   FUNGUS CULTURE, BLOOD     Status: Normal   Collection Time   01/01/12 10:45 PM      Component Value Range Status Comment   Specimen Description LEFT ANTECUBITAL   Final    Special Requests BOTTLES DRAWN AEROBIC ONLY 3 CC EA   Final    Culture NO GROWTH 7 DAYS   Final    Report Status 01/10/2012 FINAL   Final   CULTURE, BLOOD (ROUTINE X 2)     Status: Normal   Collection Time   01/01/12 10:55 PM      Component Value Range Status Comment   Specimen  Description RIGHT ANTECUBITAL   Final    Special Requests BOTTLES DRAWN AEROBIC AND ANAEROBIC 5 CC EA   Final    Culture  Setup Time 01/02/2012 03:49   Final    Culture NO GROWTH 5 DAYS   Final    Report Status 01/08/2012 FINAL   Final   CULTURE, RESPIRATORY     Status: Normal   Collection Time   01/01/12 11:35 PM      Component Value Range Status Comment   Specimen Description TRACHEAL ASPIRATE   Final    Special Requests Normal   Final    Gram Stain     Final    Value: RARE WBC PRESENT,BOTH PMN AND MONONUCLEAR     RARE SQUAMOUS EPITHELIAL CELLS PRESENT     RARE GRAM POSITIVE COCCI IN PAIRS   Culture Non-Pathogenic Oropharyngeal-type Flora Isolated.   Final    Report Status 01/04/2012 FINAL   Final   VRE CULTURE     Status: Normal   Collection Time   01/02/12  3:07 AM      Component Value Range Status Comment   Specimen Description RECTAL SWAB   Final    Special Requests Normal   Final    Culture NEGATIVE FOR VANCOMYCIN RESISTANT ENTEROCOCCI   Final    Report Status 01/06/2012 FINAL   Final   CLOSTRIDIUM DIFFICILE BY PCR     Status: Abnormal   Collection Time   01/02/12  4:54 PM      Component Value Range Status Comment   C difficile by pcr POSITIVE (*) NEGATIVE Final   BODY FLUID CULTURE     Status: Normal   Collection Time   01/03/12 10:00 AM      Component Value Range Status Comment   Specimen Description ASCITIC   Final    Special Requests NONE   Final    Gram Stain     Final    Value: NO WBC SEEN     NO ORGANISMS SEEN   Culture NO GROWTH 3 DAYS   Final  Report Status 01/06/2012 FINAL   Final   BODY FLUID CULTURE     Status: Normal   Collection Time   01/03/12  1:51 PM      Component Value Range Status Comment   Specimen Description PERITONEAL   Final    Special Requests NONE   Final    Gram Stain     Final    Value: NO WBC SEEN     NO ORGANISMS SEEN   Culture NO GROWTH 3 DAYS   Final    Report Status 01/07/2012 FINAL   Final   ANAEROBIC CULTURE     Status:  Normal   Collection Time   01/03/12  1:52 PM      Component Value Range Status Comment   Specimen Description PERITONEAL FLUID   Final    Special Requests NONE   Final    Gram Stain     Final    Value: NO WBC SEEN     NO ORGANISMS SEEN   Culture NO ANAEROBES ISOLATED   Final    Report Status 01/08/2012 FINAL   Final   CULTURE, BLOOD (ROUTINE X 2)     Status: Normal   Collection Time   01/04/12  2:55 PM      Component Value Range Status Comment   Specimen Description BLOOD LEFT HAND   Final    Special Requests BOTTLES DRAWN AEROBIC AND ANAEROBIC 5.5CC   Final    Culture  Setup Time 01/04/2012 23:02   Final    Culture NO GROWTH 5 DAYS   Final    Report Status 01/10/2012 FINAL   Final   CULTURE, BLOOD (ROUTINE X 2)     Status: Normal   Collection Time   01/04/12  3:00 PM      Component Value Range Status Comment   Specimen Description BLOOD LEFT HAND   Final    Special Requests BOTTLES DRAWN AEROBIC AND ANAEROBIC 5.5CC   Final    Culture  Setup Time 01/04/2012 23:29   Final    Culture NO GROWTH 5 DAYS   Final    Report Status 01/10/2012 FINAL   Final   CULTURE, RESPIRATORY     Status: Normal   Collection Time   01/04/12  3:37 PM      Component Value Range Status Comment   Specimen Description ENDOTRACHEAL   Final    Special Requests NONE   Final    Gram Stain     Final    Value: RARE WBC PRESENT, PREDOMINANTLY MONONUCLEAR     NO SQUAMOUS EPITHELIAL CELLS SEEN     NO ORGANISMS SEEN   Culture Non-Pathogenic Oropharyngeal-type Flora Isolated.   Final    Report Status 01/07/2012 FINAL   Final   URINE CULTURE     Status: Normal   Collection Time   01/04/12  4:59 PM      Component Value Range Status Comment   Specimen Description URINE, CATHETERIZED   Final    Special Requests zosyn,flagyl   Final    Culture  Setup Time 01/05/2012 02:21   Final    Colony Count NO GROWTH   Final    Culture NO GROWTH   Final    Report Status 01/05/2012 FINAL   Final   ANAEROBIC CULTURE     Status:  Normal   Collection Time   01/19/12  2:00 PM      Component Value Range Status Comment   Specimen Description PERITONEAL CAVITY  Final    Special Requests Normal   Final    Gram Stain     Final    Value: FEW WBC PRESENT,BOTH PMN AND MONONUCLEAR     NO ORGANISMS SEEN   Culture NO ANAEROBES ISOLATED   Final    Report Status 01/24/2012 FINAL   Final   BODY FLUID CULTURE     Status: Normal   Collection Time   01/19/12  2:00 PM      Component Value Range Status Comment   Specimen Description FLUID PERITONEAL CAVITY   Final    Special Requests NONE   Final    Gram Stain     Final    Value: FEW WBC PRESENT,BOTH PMN AND MONONUCLEAR     NO ORGANISMS SEEN   Culture NO GROWTH 3 DAYS   Final    Report Status 01/23/2012 FINAL   Final   CLOSTRIDIUM DIFFICILE BY PCR     Status: Normal   Collection Time   01/24/12 10:05 AM      Component Value Range Status Comment   C difficile by pcr NEGATIVE  NEGATIVE Final     Studies/Results: No results found.  Anti-infectives: Anti-infectives     Start     Dose/Rate Route Frequency Ordered Stop   01/23/12 1800  piperacillin-tazobactam (ZOSYN) IVPB 3.375 g       3.375 g 12.5 mL/hr over 240 Minutes Intravenous Every 8 hours 01/23/12 1155 01/30/12 1452   01/03/12 1600   metroNIDAZOLE (FLAGYL) IVPB 500 mg  Status:  Discontinued        500 mg 100 mL/hr over 60 Minutes Intravenous Every 8 hours 01/03/12 1525 01/23/12 1155   12/31/11 1100   vancomycin (VANCOCIN) 750 mg in sodium chloride 0.9 % 150 mL IVPB  Status:  Discontinued        750 mg 150 mL/hr over 60 Minutes Intravenous Every 12 hours 12/31/11 0958 01/03/12 0849   12/30/11 1000   piperacillin-tazobactam (ZOSYN) IVPB 3.375 g  Status:  Discontinued        3.375 g 12.5 mL/hr over 240 Minutes Intravenous Every 8 hours 12/30/11 0901 01/23/12 1155          Assessment/Plan: s/p right peritoneal drainage cath 10/1; would recheck CT later this week to assess adequacy of drainage esp in light of  cont abd pain and decreased drain output (last CT 10/9).   LOS: 39 days    Almond Fitzgibbon,D [facility] 02/03/2012

## 2012-02-04 LAB — GLUCOSE, CAPILLARY
Glucose-Capillary: 113 mg/dL — ABNORMAL HIGH (ref 70–99)
Glucose-Capillary: 118 mg/dL — ABNORMAL HIGH (ref 70–99)
Glucose-Capillary: 116 mg/dL — ABNORMAL HIGH (ref 70–99)

## 2012-02-04 LAB — CBC
HCT: 32.4 % — ABNORMAL LOW (ref 36.0–46.0)
MCV: 89.8 fL (ref 78.0–100.0)
RDW: 16.8 % — ABNORMAL HIGH (ref 11.5–15.5)
WBC: 11.1 10*3/uL — ABNORMAL HIGH (ref 4.0–10.5)

## 2012-02-04 LAB — COMPREHENSIVE METABOLIC PANEL
ALT: 9 U/L (ref 0–35)
AST: 21 U/L (ref 0–37)
CO2: 22 mEq/L (ref 19–32)
Calcium: 9.1 mg/dL (ref 8.4–10.5)
Chloride: 101 mEq/L (ref 96–112)
GFR calc non Af Amer: >90 mL/min (ref >90)
Sodium: 134 mEq/L — ABNORMAL LOW (ref 135–145)

## 2012-02-04 MED ORDER — CLINIMIX E/DEXTROSE (5/15) 5 % IV SOLN
INTRAVENOUS | Status: AC
  Administered 2012-02-04: 18:00:00 via INTRAVENOUS
  Filled 2012-02-04: qty 2000

## 2012-02-04 NOTE — Progress Notes (Signed)
Patient ID: [name], female   DOB: [birth year], 56 y.o.   MRN: [id number] 29 Days Post-Op  Subjective: Pt feels better.  Tolerated full liquids for 3 full meals yesterday.  Pain is better controlled.  Did some exercises and walked without her walker some yesterday.  Objective: Vital signs in last 24 hours: Temp:  [98.2 F (36.8 C)-98.7 F (37.1 C)] 98.2 F (36.8 C) (10/17 0530) Pulse Rate:  [86-90] 88  (10/17 0530) Resp:  [18] 18  (10/17 0530) BP: (100-107)/(64-66) 107/65 mmHg (10/17 0530) SpO2:  [91 %-92 %] 91 % (10/17 0530) Last BM Date: 02/03/12  Intake/Output from previous day: 10/16 0701 - 10/17 0700 In: 2520 [P.O.:120; TPN:2400] Out: 1457.5 [Urine:1400; Drains:57.5] Intake/Output this shift:    PE: Abd: soft, wound is stable, drains with minimal output, drain 3 - 25cc/24h, drain 2 -18cc/24h, drain 1 - 14.5cc/24h.  1 and 2 with more tan cloudy output, 3 with serosang type output  Lab Results:   [facility] 02/04/12 0535  WBC 11.1*  HGB 10.2*  HCT 32.4*  PLT 480*   BMET  Basename 02/04/12 0535 02/03/12 0445  NA 134* 134*  K 3.9 3.6  CL 101 100  CO2 22 26  GLUCOSE 110* 110*  BUN 10 11  CREATININE 0.45* 0.48*  CALCIUM 9.1 9.1   PT/INR No results found for this basename: LABPROT:2,INR:2 in the last 72 hours CMP     Component Value Date/Time   NA 134* 02/04/2012 0535   K 3.9 02/04/2012 0535   CL 101 02/04/2012 0535   CO2 22 02/04/2012 0535   GLUCOSE 110* 02/04/2012 0535   BUN 10 02/04/2012 0535   CREATININE 0.45* 02/04/2012 0535   CALCIUM 9.1 02/04/2012 0535   PROT 7.8 02/04/2012 0535   ALBUMIN 2.3* 02/04/2012 0535   AST 21 02/04/2012 0535   ALT 9 02/04/2012 0535   ALKPHOS 122* 02/04/2012 0535   BILITOT 0.2* 02/04/2012 0535   GFRNONAA >90 02/04/2012 0535   GFRAA >90 02/04/2012 0535   Lipase     Component Value Date/Time   LIPASE 113* 01/11/2012 0540       Studies/Results: No results found.  Anti-infectives: Anti-infectives    Start     Dose/Rate Route Frequency Ordered Stop   01/23/12 1800  piperacillin-tazobactam (ZOSYN) IVPB 3.375 g       3.375 g 12.5 mL/hr over 240 Minutes Intravenous Every 8 hours 01/23/12 1155 01/30/12 1452   01/03/12 1600   metroNIDAZOLE (FLAGYL) IVPB 500 mg  Status:  Discontinued        500 mg 100 mL/hr over 60 Minutes Intravenous Every 8 hours 01/03/12 1525 01/23/12 1155   12/31/11 1100   vancomycin (VANCOCIN) 750 mg in sodium chloride 0.9 % 150 mL IVPB  Status:  Discontinued        750 mg 150 mL/hr over 60 Minutes Intravenous Every 12 hours 12/31/11 0958 01/03/12 0849   12/30/11 1000   piperacillin-tazobactam (ZOSYN) IVPB 3.375 g  Status:  Discontinued        3.375 g 12.5 mL/hr over 240 Minutes Intravenous Every 8 hours 12/30/11 0901 01/23/12 1155           Assessment/Plan  1. Intra-abdominal fluid collections, s/p perc drain 2. S/p pancreatic debridement for ruptured pseudocyst 3. PCM/TNA  Plan: 1. Advance to bland diet today.  If tolerates this will start weaning TNA tomorrow. 2. Encouraged patient to try po pain medication today along with fentanyl patch.   LOS: 40 days  Shayona Hibbitts E 02/04/2012, 7:41 AM Pager: [phone]

## 2012-02-04 NOTE — Progress Notes (Signed)
PARENTERAL NUTRITION CONSULT NOTE - FOLLOW UP  Pharmacy Consult for TNA Indication: Severe Pancreatitis  Allergies  Allergen Reactions  . Ciprofloxacin Nausea Only    REACTION: nausea   Patient Measurements: Height: 5\' 6"  (167.6 cm) Weight: 166 lb 10.7 oz (75.6 kg) IBW/kg (Calculated) : 59.3   Vital Signs: Temp: 98.2 F (36.8 C) (10/17 0530) Temp src: Oral (10/17 0530) BP: 107/65 mmHg (10/17 0530) Pulse Rate: 88  (10/17 0530) Intake/Output from previous day: 10/16 0701 - 10/17 0700 In: 2520 [P.O.:120; TPN:2400] Out: 1457.5 [Urine:1400; Drains:57.5] Intake/Output from this shift:    Labs:  [facility] 02/04/12 0535  WBC 11.1*  HGB 10.2*  HCT 32.4*  PLT 480*  APTT --  INR --    Basename 02/04/12 0535 02/03/12 0445 02/02/12 0331  NA 134* 134* 133*  K 3.9 3.6 3.8  CL 101 100 99  CO2 22 26 26   GLUCOSE 110* 110* 104*  BUN 10 11 12   CREATININE 0.45* 0.48* 0.47*  LABCREA -- -- --  CREAT24HRUR -- -- --  CALCIUM 9.1 9.1 8.9  MG 1.8 -- 1.7  PHOS 4.1 -- 4.1  PROT 7.8 7.7 --  ALBUMIN 2.3* 2.2* --  AST 21 24 --  ALT 9 9 --  ALKPHOS 122* 114 --  BILITOT 0.2* 0.2* --  BILIDIR -- -- --  IBILI -- -- --  PREALBUMIN -- -- --  TRIG -- -- --  CHOLHDL -- -- --  CHOL -- -- --   Estimated Creatinine Clearance: 81.6 ml/min (by C-G formula based on Cr of 0.45).    Basename 02/04/12 0740 02/03/12 2058 02/03/12 1727  GLUCAP 113* 119* 94   Insulin Requirements in the past 24 hours:  CBGs < 150, required 0 units of SSI last 24h.  Nutritional Goals:  RD recs 9/16: 1762 kcal, 86-95 grams protein Reestimated s/p extubation 9/30: 1475-1775 kcal, 94-110 grams protein Clinimix 5/15 @ 58ml/hr + Lipid 20% MWF will provide 96 g protein/day, 1843 kCal on lipid days, 1363 Kcal on non-lipid days for an average of 1569 Kcal per day/week.  Current nutrition:  Diet: advanced to bland diet 10/17 am. Did well/24h with full liquids mIVF: NS @ KVO Clinimix E 5/15 80 ml/hr +  IVFE 20% @  10 ml/hr on MWF  Assessment:  [name] with h/o recurrent pancreatitis and ETOH abuse, s/p ERCP with sphincterotomy on 10/06/2011 here this admit with acute pancreatitis, developed ileus. Rapid response called 9/12 for respiratory decline and lethargy - pt transfer to ICU and CCM consulted. Intubated 9/12. 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   Pt underwent exploratory laparotomy with debridement of necrotic pancreas and drainage of infected, ruptured pseudocyst 9/15. TNA started 9/16.  9/30 CT abd = extensive loculated fluid in both sides of the abdomen consistent with a pseudocyst.  Additional drain placed by IR 10/1, 10/3.  TNA had been running at a weaned rate to help patient work toward po intake goals (to increase appetite) but patient could not tolerate (d/t pain/nausea) and diet was reduced back to CLD.  TNA was increased back up to goal rate 10/14 after discussion with surgery to provide 100% nutrition parenterally since did not anticipate that patient will be receiving much nutrition from CLD with her minimal intake.  Patient refusing PANDA tube for TFs.  Did well on Full liquids last 24h, diet adv to bland diet. If tolerates, will wean TNA tom Per CCS note.  Renal: Scr wnl/stable Hepatic fxn: AST/ALT wnl, alk phos slightly increased. (Note on scheduled APAP 3gm/day (no PRN APAP being admin) Electrolytes: Na 134 (unable to correct with pre-mixed TNA), Corr Ca+ = 10.5 (at ULN) Pre-Alb: 2.3 (9/17), 12.2 (9/23), 11.4 (9/30), 9.9 (10/7), 11.7 (10/14) TG/Cholesterol: TG mildly elevated at 155, cholesterol WNL CBGs: at goal (<150 mg/dL), requiring minimal insulin   Plan:    Continue Clinimix E 5/15 at 80 ml/hr, wean tom if does well with bland diet  IV fat emulsion on MWF only due to ongoing shortage.  Tolerating oral vitamin + minerals thus no MVI or TE in TPN  TNA labs Monday/Thursdays.    Juliette Alcide, PharmD, BCPS.   Pager: [phone] 02/04/2012 8:16 AM

## 2012-02-04 NOTE — Progress Notes (Signed)
Patient interviewed and examined, agree with PA note above.  Mariella Saa MD, FACS  02/04/2012 5:25 PM

## 2012-02-04 NOTE — Progress Notes (Signed)
PT Cancellation Note  Patient Details Name: ARMETTA VOLTA MRN: [id number] DOB: 15-Sep-1955   Cancelled Treatment:     Attempted PT tx session this pm. Pt declined to participate due to pain-"they changed my pain medicine this afternoon, and its just not helping. I'm in too much pain to do anything." Recommended to pt that she discuss this with MD on tomorrow. Will check back another day. Thanks.    Rebeca Alert [facility] 02/04/2012, 2:11 PM [phone]

## 2012-02-04 NOTE — Progress Notes (Signed)
Nutrition Follow-up  Intervention: TPN per pharmacy. Encouraged gradually increasing intake. Hopefully as pt's intake continues to improve, TPN can be weaned. Recommend re-weigh pt. Will monitor.   Diet Order:  Parke Simmers  TPN with Clinimix E 5/15 @ 80 ml/hr.  Lipids (20% IVFE @ 10 ml/hr), multivitamins, and trace elements are provided 3 times weekly (MWF) due to national backorder.  Provides 1569 kcal and 96 grams protein daily (based on weekly average).  Meets 88% minimum estimated kcal and 87% minimum estimated protein needs.  Additional IVF with NS @ 10-20 ml/hr.  - Met with pt this morning who stated she had done well with the full liquids yesterday. Observed that pt had consumed a few bites of eggs this morning and most of her muffin on her breakfast tray. Pt stated she had a little discomfort after eating it.   - CBGs well controlled - PALB improved from 9.9 mg/dL on 16/1 to 09.6 mg/dL on 04/54 - Triglycerides elevated  Meds: Scheduled Meds:   . acetaminophen  1,000 mg Oral TID  . acidophilus  1 capsule Oral Daily  . antiseptic oral rinse  15 mL Mouth Rinse QID  . chlorhexidine  15 mL Mouth Rinse BID  . enoxaparin (LOVENOX) injection  40 mg Subcutaneous Q24H  . fentaNYL  25 mcg Transdermal Q72H  . folic acid  1 mg Oral Daily  . furosemide  20 mg Oral Daily  . insulin aspart  0-15 Units Subcutaneous TID WC  . insulin aspart  0-5 Units Subcutaneous QHS  . lip balm  1 application Topical BID  . LORazepam  1 mg Oral TID  . metoCLOPramide (REGLAN) injection  5 mg Intravenous Q8H  . multivitamin with minerals  1 tablet Oral Daily  . pantoprazole  40 mg Oral Daily   Continuous Infusions:   . sodium chloride 20 mL (02/01/12 0310)  . fat emulsion 250 mL (02/03/12 1812)  . TPN (CLINIMIX) +/- additives 80 mL/hr at 02/02/12 1717  . TPN (CLINIMIX) +/- additives 80 mL/hr at 02/03/12 1812  . TPN (CLINIMIX) +/- additives     PRN Meds:.acetaminophen, diphenhydrAMINE, HYDROmorphone  (DILAUDID) injection, magic mouthwash, menthol-cetylpyridinium, ondansetron (ZOFRAN) IV, oxyCODONE, polyvinyl alcohol, sodium chloride  Labs:  CMP     Component Value Date/Time   NA 134* 02/04/2012 0535   K 3.9 02/04/2012 0535   CL 101 02/04/2012 0535   CO2 22 02/04/2012 0535   GLUCOSE 110* 02/04/2012 0535   BUN 10 02/04/2012 0535   CREATININE 0.45* 02/04/2012 0535   CALCIUM 9.1 02/04/2012 0535   PROT 7.8 02/04/2012 0535   ALBUMIN 2.3* 02/04/2012 0535   AST 21 02/04/2012 0535   ALT 9 02/04/2012 0535   ALKPHOS 122* 02/04/2012 0535   BILITOT 0.2* 02/04/2012 0535   GFRNONAA >90 02/04/2012 0535   GFRAA >90 02/04/2012 0535   Prealbumin  Date Value Range Status  02/01/2012 11.7* 17.0 - 34.0 mg/dL Final   CBG (last 3)   Basename 02/04/12 1119 02/04/12 0740 02/03/12 2058  GLUCAP 118* 113* 119*      Intake/Output Summary (Last 24 hours) at 02/04/12 1607 Last data filed at 02/04/12 1222  Gross per 24 hour  Intake   2640 ml  Output 1057.5 ml  Net 1582.5 ml   Last BM - 10/16  Weight Status:  No new weights  Estimated needs:   1775-1900 calories 110-120g protein  Nutrition Dx: Inadequate oral intake - ongoing  Goal:   1. Diet advancement - met, pt on bland  diet 2. TPN to meet >90% of estimated energy needs - met as close as possible with premixed formula 3. Promote weight maintenance - unsure, no new weights since 9/24  New goal: Pt will consume >50% of meals.   Monitor: Weights, labs, intake, TPN   Levon Hedger MS, RD, LDN [phone] Pager [phone] After Hours Pager

## 2012-02-05 LAB — GLUCOSE, CAPILLARY: Glucose-Capillary: 102 mg/dL — ABNORMAL HIGH (ref 70–99)

## 2012-02-05 MED ORDER — ONDANSETRON HCL 4 MG/2ML IJ SOLN
4.0000 mg | INTRAMUSCULAR | Status: DC | PRN
  Administered 2012-02-05 – 2012-02-08 (×12): 4 mg via INTRAVENOUS
  Filled 2012-02-05 (×12): qty 2

## 2012-02-05 MED ORDER — FAT EMULSION 20 % IV EMUL
250.0000 mL | INTRAVENOUS | Status: AC
  Administered 2012-02-05: 250 mL via INTRAVENOUS
  Filled 2012-02-05: qty 250

## 2012-02-05 MED ORDER — CLINIMIX E/DEXTROSE (5/15) 5 % IV SOLN
INTRAVENOUS | Status: AC
  Administered 2012-02-05: 17:00:00 via INTRAVENOUS
  Filled 2012-02-05: qty 1000

## 2012-02-05 NOTE — Progress Notes (Signed)
PARENTERAL NUTRITION CONSULT NOTE - FOLLOW UP  Pharmacy Consult for TNA Indication: Severe Pancreatitis  Allergies  Allergen Reactions  . Ciprofloxacin Nausea Only    REACTION: nausea   Patient Measurements: Height: 5\' 6"  (167.6 cm) Weight: 166 lb 10.7 oz (75.6 kg) IBW/kg (Calculated) : 59.3   Vital Signs: Temp: 98.2 F (36.8 C) (10/18 0600) Temp src: Oral (10/18 0600) BP: 107/64 mmHg (10/18 0600) Pulse Rate: 86  (10/18 0600) Intake/Output from previous day: 10/17 0701 - 10/18 0700 In: 120 [P.O.:120] Out: 22.5 [Drains:22.5] Intake/Output from this shift:    Labs:  [facility] 02/04/12 0535  WBC 11.1*  HGB 10.2*  HCT 32.4*  PLT 480*  APTT --  INR --    Basename 02/04/12 0535 02/03/12 0445  NA 134* 134*  K 3.9 3.6  CL 101 100  CO2 22 26  GLUCOSE 110* 110*  BUN 10 11  CREATININE 0.45* 0.48*  LABCREA -- --  CREAT24HRUR -- --  CALCIUM 9.1 9.1  MG 1.8 --  PHOS 4.1 --  PROT 7.8 7.7  ALBUMIN 2.3* 2.2*  AST 21 24  ALT 9 9  ALKPHOS 122* 114  BILITOT 0.2* 0.2*  BILIDIR -- --  IBILI -- --  PREALBUMIN -- --  TRIG -- --  CHOLHDL -- --  CHOL -- --   Estimated Creatinine Clearance: 81.6 ml/min (by C-G formula based on Cr of 0.45).    Basename 02/04/12 2128 02/04/12 1635 02/04/12 1119  GLUCAP 128* 116* 118*   Insulin Requirements in the past 24 hours:  CBGs < 150, required 0 units of SSI last 24h.  Nutritional Goals:  RD recs 9/16: 1762 kcal, 86-95 grams protein Reestimated s/p extubation 9/30: 1475-1775 kcal, 94-110 grams protein Clinimix 5/15 @ 3ml/hr + Lipid 20% MWF will provide 96 g protein/day, 1843 kCal on lipid days, 1363 Kcal on non-lipid days for an average of 1569 Kcal per day/week.  Current nutrition:  Diet: advanced to bland diet 10/17 am. Did well/24h with full liquids mIVF: NS @ KVO Clinimix E 5/15 80 ml/hr +  IVFE 20% @ 10 ml/hr on MWF  Assessment:  [name] with h/o recurrent pancreatitis and ETOH abuse, s/p ERCP with sphincterotomy on  10/06/2011 here this admit with acute pancreatitis, developed ileus. Rapid response called 9/12 for respiratory decline and lethargy - pt transfer to ICU and CCM consulted. Intubated 9/12. 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   Pt underwent exploratory laparotomy with debridement of necrotic pancreas and drainage of infected, ruptured pseudocyst 9/15. TNA started 9/16.  9/30 CT abd = extensive loculated fluid in both sides of the abdomen consistent with a pseudocyst.  Additional drain placed by IR 10/1, 10/3.  TNA had been running at a weaned rate to help patient work toward po intake goals (to increase appetite) but patient could not tolerate (d/t pain/nausea) and diet was reduced back to CLD.  TNA was increased back up to goal rate 10/14 after discussion with surgery to provide 100% nutrition parenterally since did not anticipate that patient will be receiving much nutrition from CLD with her minimal intake.  Patient refusing PANDA tube for TFs.  Did well on full liquids and diet advanced to [place] 10/17.  Doing OK on bland ate baked potato last pm and yogurt, milk and muffin for breakfast  Renal: Scr wnl/stable Hepatic fxn: AST/ALT wnl, alk phos slightly increased. (Note on scheduled APAP 3gm/day (no PRN APAP being admin)  Electrolytes: No new labs this am, previously: Na 134 (unable to correct with pre-mixed TNA), Corr Ca+ = 10.5 (at [facility]) Pre-Alb: 2.3 (9/17), 12.2 (9/23), 11.4 (9/30), 9.9 (10/7), 11.7 (10/14) TG/Cholesterol: TG mildly elevated at 155, cholesterol WNL CBGs: at goal (<150 mg/dL), requiring minimal insulin   Plan:    Decrease Clinimix E 5/15 to 40 ml/hr, plans to hopefully stop TPN this weekend if continues to do well  ?? Start calorie count if appropriate  IV fat emulsion on MWF only due to ongoing shortage.  Tolerating oral vitamin + minerals thus no MVI or TE in TPN  TNA labs  Monday/Thursdays.   Juliette Alcide, PharmD, BCPS.   Pager: [phone] 02/05/2012 7:11 AM

## 2012-02-05 NOTE — Progress Notes (Signed)
Occupational Therapy Treatment Patient Details Name: [name] MRN: [id number] DOB: [birth year] Today's Date: 02/05/2012 Time: 4098-1191 OT Time Calculation (min): 35 min  OT Assessment / Plan / Recommendation Comments on Treatment Session      Follow Up Recommendations   Home Health OT;     Barriers to Discharge       Equipment Recommendations  Rolling walker with 5" wheels (pt does not want 3:1)    Recommendations for Other Services    Frequency  2x/week   Plan      Precautions / Restrictions Precautions Precautions: Fall Precaution Comments: abdominal drains (3) Restrictions Weight Bearing Restrictions: No   Pertinent Vitals/Pain Abdomen painful--received medication but not working like last one did    ADL  ADL Comments: focus of tx today was on energy conservation and addressing concerns for d/c home.  Pt has limited support.  Reviewed energy conservation and gave pt handout.  She is already able to do basic adl with set up (per her report), but she is sometimes limited by both endurance and especially pain.  Discussed what she will need to be able to do for herself:  food prepartion:  she has a crockpot and can have someone do grocery shopping.  Discussed storage heights that are best.   discussed use of crockpot bags for easier clean up and pouring water into crockpot to soak until someone can help her.  She does not want 3:1 at home:  feels it wont fit and doesn't think she'll need it.  Also discussed elevated toilet seat if needed.  Educated on reacher for use with retrieving items and using to put clothes into and out of dryer.  Pt feels she can let most of her housework go.  She is concerned about being alone, if something was to happen:  such as a fall.  Encouraged her to keep cell phone or cordless phone with her at all times.  Pt verbalizes understanding of energy conservation.  Her primary concern is pain management as she doesn't want to fall into a cycle where  she stays in bed all of the time.     OT Diagnosis:    OT Problem List:   OT Treatment Interventions:     OT Goals Miscellaneous OT Goals Miscellaneous OT Goal #1: Pt will verbalize 3 energy conservation strategies for use at home OT Goal: Miscellaneous Goal #1 - Progress: Goal set today Miscellaneous OT Goal #2: Patient will verbalize 2 strategies she can use to increase safety at home OT Goal: Miscellaneous Goal #2 - Progress: Goal set today  Visit Information  Last OT Received On: 02/05/12 Assistance Needed: +1    Subjective Data      Prior Functioning       Cognition  Overall Cognitive Status: Appears within functional limits for tasks assessed/performed Arousal/Alertness: Awake/alert Orientation Level: Appears intact for tasks assessed Behavior During Session: [facility] for tasks performed    Mobility  Shoulder Instructions       Exercises      Balance    End of Session OT - End of Session Activity Tolerance: Patient limited by pain Patient left: in bed;with call bell/phone within reach  GO     Lesean Woolverton 02/05/2012, 3:04 PM [name], OTR/L [phone] 02/05/2012

## 2012-02-05 NOTE — Progress Notes (Signed)
Patient ID: [name], female   DOB: 03-Nov-1955, 56 y.o.   MRN: [id number] 30 Days Post-Op  Subjective: Pt c/o nausea; however, she says that's from pain because of PICC line dressing change.  Otherwise, her pain is better controlled with oxy IR yesterday.  Tolerating regular diet with no increase in pain.    Objective: Vital signs in last 24 hours: Temp:  [98.2 F (36.8 C)-98.4 F (36.9 C)] 98.2 F (36.8 C) (10/18 0600) Pulse Rate:  [86-87] 86  (10/18 0600) Resp:  [20] 20  (10/18 0600) BP: (100-107)/(63-64) 107/64 mmHg (10/18 0600) SpO2:  [92 %-93 %] 93 % (10/18 0600) Last BM Date: 02/03/12  Intake/Output from previous day: 10/17 0701 - 10/18 0700 In: 120 [P.O.:120] Out: 22.5 [Drains:22.5] Intake/Output this shift:    PE: Abd: soft, stable, wound is stable, drains all with small amount of tan purulent drainage except radiology drain is mostly serosang with some particles present  Lab Results:   [facility] 02/04/12 0535  WBC 11.1*  HGB 10.2*  HCT 32.4*  PLT 480*   BMET  Basename 02/04/12 0535 02/03/12 0445  NA 134* 134*  K 3.9 3.6  CL 101 100  CO2 22 26  GLUCOSE 110* 110*  BUN 10 11  CREATININE 0.45* 0.48*  CALCIUM 9.1 9.1   PT/INR No results found for this basename: LABPROT:2,INR:2 in the last 72 hours CMP     Component Value Date/Time   NA 134* 02/04/2012 0535   K 3.9 02/04/2012 0535   CL 101 02/04/2012 0535   CO2 22 02/04/2012 0535   GLUCOSE 110* 02/04/2012 0535   BUN 10 02/04/2012 0535   CREATININE 0.45* 02/04/2012 0535   CALCIUM 9.1 02/04/2012 0535   PROT 7.8 02/04/2012 0535   ALBUMIN 2.3* 02/04/2012 0535   AST 21 02/04/2012 0535   ALT 9 02/04/2012 0535   ALKPHOS 122* 02/04/2012 0535   BILITOT 0.2* 02/04/2012 0535   GFRNONAA >90 02/04/2012 0535   GFRAA >90 02/04/2012 0535   Lipase     Component Value Date/Time   LIPASE 113* 01/11/2012 0540       Studies/Results: No results found.  Anti-infectives: Anti-infectives     Start      Dose/Rate Route Frequency Ordered Stop   01/23/12 1800  piperacillin-tazobactam (ZOSYN) IVPB 3.375 g       3.375 g 12.5 mL/hr over 240 Minutes Intravenous Every 8 hours 01/23/12 1155 01/30/12 1452   01/03/12 1600   metroNIDAZOLE (FLAGYL) IVPB 500 mg  Status:  Discontinued        500 mg 100 mL/hr over 60 Minutes Intravenous Every 8 hours 01/03/12 1525 01/23/12 1155   12/31/11 1100   vancomycin (VANCOCIN) 750 mg in sodium chloride 0.9 % 150 mL IVPB  Status:  Discontinued        750 mg 150 mL/hr over 60 Minutes Intravenous Every 12 hours 12/31/11 0958 01/03/12 0849   12/30/11 1000   piperacillin-tazobactam (ZOSYN) IVPB 3.375 g  Status:  Discontinued        3.375 g 12.5 mL/hr over 240 Minutes Intravenous Every 8 hours 12/30/11 0901 01/23/12 1155           Assessment/Plan  1. S/p ex lap with pancreatic debridement for perf pseudocyst. 2. Significant social issues 3. PCM/TNA 4. Multiple intra-abdominal fluid collections  Plan: 1. [place], CSW, Windell Moulding, [place], and myself had a very extensive discussion with the patient over disposition issues.  The patient has legitimate concerns about how she is going  to be able to care for herself at home.  Unfortunately, she does not qualify for SNF.  SW is going to try and arrange meals on wheels and other resources to help the patient as much as possible upon dc home.  CM is also getting HH PT, OT, RN, aid, and SW set up to assist the patient at home as well. 2. Wean TNA to 50% today and plan to wean to off over the weekend 3. Anticipate dc home on Monday after repeat CT scan.  Will get CT scan prior to dc to assist patient so she doesn't have to come back later next week as an outpatient, when she will unlikely be able to schedule transportation to bring her here. 4. Patient is scared, but understands the position we are in.  She is anticipating home next Mon or Tuesday.   LOS: 41 days    Lamae Fosco E 02/05/2012, 11:52 AM Pager: [phone]

## 2012-02-05 NOTE — Progress Notes (Signed)
Home health services will be provided by [facility]. These will include HHPT/OT/RN, bath aide and MSW. Pt stated she has a friend who can assist her at home. I encouraged her to get out of the bed often and ambulate in the halls with staff so that she can get used to it. Also encouraged her to learn how to do her dressing change and take care of her drains.

## 2012-02-05 NOTE — Progress Notes (Signed)
CSW met with patient, in addition to Barnetta Chapel, [place] and Windell Moulding, Sports coach. Discussed that due to patient progressing well in physical therapy, patient no long meets criteria for a letter of guarantee and will need to be discharged home upon medical clearance (CSW had discussed this with CSW director, hope rife). Patient was very concerned, the team provided patient with resources that may help and discussed options at length. Patient is agreeable to discharge home and understands that this will be happening next week.  [name] C. Francie Keeling MSW, LCSW [phone]

## 2012-02-05 NOTE — Progress Notes (Addendum)
Physical Therapy Treatment Patient Details Name: ARLYN BUMPUS MRN: [id number] DOB: 1955-07-05 Today's Date: 02/05/2012 Time: 1201-1227 PT Time Calculation (min): 26 min  PT Assessment / Plan / Recommendation Comments on Treatment Session  Case manager reported pt my have issues using RW in home due to limited space. Would still recommend HHPT assessing home environment and follow-up for possibility of RW use for added safety with mobility. Still feel pt would benefit from some assistance at home.-possibly home health aide.     Follow Up Recommendations  Post acute inpatient-Skilled nursing facility (if at all possible). However, pt and case manager state plan is for home. If home then, HHPT with at least intermittent supervision.      Does the patient have the potential to tolerate intense rehabilitation  No, Recommend SNF  Barriers to Discharge        Equipment Recommendations  Rolling walker with 5" wheels;3 in 1 bedside comode; Home Health Aide. Ambulance transport home if stair negotiation is an issue.   Recommendations for Other Services OT consult  Frequency Min 3X/week   Plan Discharge plan remains appropriate    Precautions / Restrictions Precautions Precautions: Fall Precaution Comments: abdominal drains (3) Restrictions Weight Bearing Restrictions: No   Pertinent Vitals/Pain 9/10 abdomen    Mobility  Bed Mobility Bed Mobility: Supine to Sit Supine to Sit: 6: Modified independent (Device/Increase time);HOB elevated;With rails Transfers Transfers: Sit to Stand;Stand to Sit Sit to Stand: 6: Modified independent (Device/Increase time);From bed;From toilet Stand to Sit: 6: Modified independent (Device/Increase time);To chair/3-in-1;To toilet Ambulation/Gait Ambulation/Gait Assistance: 4: Min guard Ambulation Distance (Feet): 400 Feet Assistive device: None Ambulation/Gait Assistance Details: Ambulation without assistive device this session. Pt did well-no  LOB. Gait is guarded with decreased armswing. Attempted ambulation with straight cane but pt had difficulty with technique, sequencing-not improving gait pattern at all.  Gait Pattern: Decreased stride length;Decreased step length - right;Decreased step length - left    Exercises     PT Diagnosis:    PT Problem List:   PT Treatment Interventions:     PT Goals Acute Rehab PT Goals Pt will go Supine/Side to Sit: with modified independence;with HOB 0 degrees PT Goal: Supine/Side to Sit - Progress: Updated due to goal met Pt will go Sit to Supine/Side: with modified independence;with HOB 0 degrees PT Goal: Sit to Supine/Side - Progress: Updated due to goal met PT Goal: Sit to Stand - Progress: Met PT Goal: Stand to Sit - Progress: Met Pt will Ambulate: >150 feet;with modified independence;with least restrictive assistive device PT Goal: Ambulate - Progress: Progressing toward goal  Visit Information  Last PT Received On: 02/05/12 Assistance Needed: +1    Subjective Data  Subjective: "Well...they're sending me home Mon or Tues" Patient Stated Goal: Less pain.    Cognition  Overall Cognitive Status: Appears within functional limits for tasks assessed/performed Arousal/Alertness: Awake/alert Orientation Level: Appears intact for tasks assessed Behavior During Session: [facility] for tasks performed    Balance  Balance Balance Assessed: Yes Dynamic Standing Balance Dynamic Standing - Balance Support: No upper extremity supported Dynamic Standing - Level of Assistance: 5: Stand by assistance Dynamic Standing - Comments: had pt navigate obstacles (around and stepping over) x 2 run throughs. Side-stepping, backwards walking x2 20 feet High Level Balance High Level Balance Activites: Side stepping;Backward walking;Direction changes;Turns  End of Session PT - End of Session Activity Tolerance: Patient limited by fatigue Patient left: in chair;with call bell/phone within reach   GP  Rebeca Alert [facility] 02/05/2012, 1:26 PM [phone]

## 2012-02-05 NOTE — Progress Notes (Signed)
30 Days Post-Op  Subjective: Pt without new c/o; still has some intermittent nausea/abd pain  Objective: Vital signs in last 24 hours: Temp:  [98.2 F (36.8 C)-98.4 F (36.9 C)] 98.2 F (36.8 C) (10/18 0600) Pulse Rate:  [86-87] 86  (10/18 0600) Resp:  [20] 20  (10/18 0600) BP: (100-107)/(63-64) 107/64 mmHg (10/18 0600) SpO2:  [92 %-93 %] 93 % (10/18 0600) Last BM Date: 02/03/12  Intake/Output from previous day: 10/17 0701 - 10/18 0700 In: 120 [P.O.:120] Out: 22.5 [Drains:22.5] Intake/Output this shift:    Rt perit drain intact, output 20 cc's today, insertion site ok  Lab Results:   [facility] 02/04/12 0535  WBC 11.1*  HGB 10.2*  HCT 32.4*  PLT 480*   BMET  Basename 02/04/12 0535 02/03/12 0445  NA 134* 134*  K 3.9 3.6  CL 101 100  CO2 22 26  GLUCOSE 110* 110*  BUN 10 11  CREATININE 0.45* 0.48*  CALCIUM 9.1 9.1   PT/INR No results found for this basename: LABPROT:2,INR:2 in the last 72 hours ABG No results found for this basename: PHART:2,PCO2:2,PO2:2,HCO3:2 in the last 72 hours  Studies/Results: No results found. Results for orders placed during the hospital encounter of 12/26/11  URINE CULTURE     Status: Normal   Collection Time   12/27/11  9:37 PM      Component Value Range Status Comment   Specimen Description URINE, RANDOM   Final    Special Requests NONE   Final    Culture  Setup Time 12/28/2011 09:31   Final    Colony Count 45,000 COLONIES/ML   Final    Culture     Final    Value: Multiple bacterial morphotypes present, none predominant. Suggest appropriate recollection if clinically indicated.   Report Status 12/29/2011 FINAL   Final   CULTURE, BLOOD (ROUTINE X 2)     Status: Normal   Collection Time   12/30/11  9:50 AM      Component Value Range Status Comment   Specimen Description BLOOD RIGHT ARM   Final    Special Requests BOTTLES DRAWN AEROBIC AND ANAEROBIC 5CC   Final    Culture  Setup Time 12/30/2011 14:02   Final    Culture NO GROWTH  5 DAYS   Final    Report Status 01/05/2012 FINAL   Final   CULTURE, BLOOD (ROUTINE X 2)     Status: Normal   Collection Time   12/30/11 10:00 AM      Component Value Range Status Comment   Specimen Description BLOOD LEFT ARM   Final    Special Requests BOTTLES DRAWN AEROBIC AND ANAEROBIC 5CC   Final    Culture  Setup Time 12/30/2011 14:02   Final    Culture NO GROWTH 5 DAYS   Final    Report Status 01/05/2012 FINAL   Final   MRSA PCR SCREENING     Status: Normal   Collection Time   12/31/11 11:27 AM      Component Value Range Status Comment   MRSA by PCR NEGATIVE  NEGATIVE Final   URINE CULTURE     Status: Normal   Collection Time   01/01/12  9:30 PM      Component Value Range Status Comment   Specimen Description URINE, CATHETERIZED   Final    Special Requests Normal   Final    Culture  Setup Time 01/02/2012 01:51   Final    Colony Count NO GROWTH  Final    Culture NO GROWTH   Final    Report Status 01/03/2012 FINAL   Final   CULTURE, BLOOD (ROUTINE X 2)     Status: Normal   Collection Time   01/01/12 10:45 PM      Component Value Range Status Comment   Specimen Description LEFT ANTECUBITAL   Final    Special Requests BOTTLES DRAWN AEROBIC AND ANAEROBIC 3 CC EA   Final    Culture  Setup Time 01/02/2012 03:49   Final    Culture NO GROWTH 5 DAYS   Final    Report Status 01/08/2012 FINAL   Final   FUNGUS CULTURE, BLOOD     Status: Normal   Collection Time   01/01/12 10:45 PM      Component Value Range Status Comment   Specimen Description LEFT ANTECUBITAL   Final    Special Requests BOTTLES DRAWN AEROBIC ONLY 3 CC EA   Final    Culture NO GROWTH 7 DAYS   Final    Report Status 01/10/2012 FINAL   Final   CULTURE, BLOOD (ROUTINE X 2)     Status: Normal   Collection Time   01/01/12 10:55 PM      Component Value Range Status Comment   Specimen Description RIGHT ANTECUBITAL   Final    Special Requests BOTTLES DRAWN AEROBIC AND ANAEROBIC 5 CC EA   Final    Culture  Setup Time  01/02/2012 03:49   Final    Culture NO GROWTH 5 DAYS   Final    Report Status 01/08/2012 FINAL   Final   CULTURE, RESPIRATORY     Status: Normal   Collection Time   01/01/12 11:35 PM      Component Value Range Status Comment   Specimen Description TRACHEAL ASPIRATE   Final    Special Requests Normal   Final    Gram Stain     Final    Value: RARE WBC PRESENT,BOTH PMN AND MONONUCLEAR     RARE SQUAMOUS EPITHELIAL CELLS PRESENT     RARE GRAM POSITIVE COCCI IN PAIRS   Culture Non-Pathogenic Oropharyngeal-type Flora Isolated.   Final    Report Status 01/04/2012 FINAL   Final   VRE CULTURE     Status: Normal   Collection Time   01/02/12  3:07 AM      Component Value Range Status Comment   Specimen Description RECTAL SWAB   Final    Special Requests Normal   Final    Culture NEGATIVE FOR VANCOMYCIN RESISTANT ENTEROCOCCI   Final    Report Status 01/06/2012 FINAL   Final   CLOSTRIDIUM DIFFICILE BY PCR     Status: Abnormal   Collection Time   01/02/12  4:54 PM      Component Value Range Status Comment   C difficile by pcr POSITIVE (*) NEGATIVE Final   BODY FLUID CULTURE     Status: Normal   Collection Time   01/03/12 10:00 AM      Component Value Range Status Comment   Specimen Description ASCITIC   Final    Special Requests NONE   Final    Gram Stain     Final    Value: NO WBC SEEN     NO ORGANISMS SEEN   Culture NO GROWTH 3 DAYS   Final    Report Status 01/06/2012 FINAL   Final   BODY FLUID CULTURE     Status: Normal   Collection Time  01/03/12  1:51 PM      Component Value Range Status Comment   Specimen Description PERITONEAL   Final    Special Requests NONE   Final    Gram Stain     Final    Value: NO WBC SEEN     NO ORGANISMS SEEN   Culture NO GROWTH 3 DAYS   Final    Report Status 01/07/2012 FINAL   Final   ANAEROBIC CULTURE     Status: Normal   Collection Time   01/03/12  1:52 PM      Component Value Range Status Comment   Specimen Description PERITONEAL FLUID   Final      Special Requests NONE   Final    Gram Stain     Final    Value: NO WBC SEEN     NO ORGANISMS SEEN   Culture NO ANAEROBES ISOLATED   Final    Report Status 01/08/2012 FINAL   Final   CULTURE, BLOOD (ROUTINE X 2)     Status: Normal   Collection Time   01/04/12  2:55 PM      Component Value Range Status Comment   Specimen Description BLOOD LEFT HAND   Final    Special Requests BOTTLES DRAWN AEROBIC AND ANAEROBIC 5.5CC   Final    Culture  Setup Time 01/04/2012 23:02   Final    Culture NO GROWTH 5 DAYS   Final    Report Status 01/10/2012 FINAL   Final   CULTURE, BLOOD (ROUTINE X 2)     Status: Normal   Collection Time   01/04/12  3:00 PM      Component Value Range Status Comment   Specimen Description BLOOD LEFT HAND   Final    Special Requests BOTTLES DRAWN AEROBIC AND ANAEROBIC 5.5CC   Final    Culture  Setup Time 01/04/2012 23:29   Final    Culture NO GROWTH 5 DAYS   Final    Report Status 01/10/2012 FINAL   Final   CULTURE, RESPIRATORY     Status: Normal   Collection Time   01/04/12  3:37 PM      Component Value Range Status Comment   Specimen Description ENDOTRACHEAL   Final    Special Requests NONE   Final    Gram Stain     Final    Value: RARE WBC PRESENT, PREDOMINANTLY MONONUCLEAR     NO SQUAMOUS EPITHELIAL CELLS SEEN     NO ORGANISMS SEEN   Culture Non-Pathogenic Oropharyngeal-type Flora Isolated.   Final    Report Status 01/07/2012 FINAL   Final   URINE CULTURE     Status: Normal   Collection Time   01/04/12  4:59 PM      Component Value Range Status Comment   Specimen Description URINE, CATHETERIZED   Final    Special Requests zosyn,flagyl   Final    Culture  Setup Time 01/05/2012 02:21   Final    Colony Count NO GROWTH   Final    Culture NO GROWTH   Final    Report Status 01/05/2012 FINAL   Final   ANAEROBIC CULTURE     Status: Normal   Collection Time   01/19/12  2:00 PM      Component Value Range Status Comment   Specimen Description PERITONEAL CAVITY   Final     Special Requests Normal   Final    Gram Stain     Final  Value: FEW WBC PRESENT,BOTH PMN AND MONONUCLEAR     NO ORGANISMS SEEN   Culture NO ANAEROBES ISOLATED   Final    Report Status 01/24/2012 FINAL   Final   BODY FLUID CULTURE     Status: Normal   Collection Time   01/19/12  2:00 PM      Component Value Range Status Comment   Specimen Description FLUID PERITONEAL CAVITY   Final    Special Requests NONE   Final    Gram Stain     Final    Value: FEW WBC PRESENT,BOTH PMN AND MONONUCLEAR     NO ORGANISMS SEEN   Culture NO GROWTH 3 DAYS   Final    Report Status 01/23/2012 FINAL   Final   CLOSTRIDIUM DIFFICILE BY PCR     Status: Normal   Collection Time   01/24/12 10:05 AM      Component Value Range Status Comment   C difficile by pcr NEGATIVE  NEGATIVE Final     Anti-infectives: Anti-infectives     Start     Dose/Rate Route Frequency Ordered Stop   01/23/12 1800   piperacillin-tazobactam (ZOSYN) IVPB 3.375 g        3.375 g 12.5 mL/hr over 240 Minutes Intravenous Every 8 hours 01/23/12 1155 01/30/12 1452   01/03/12 1600   metroNIDAZOLE (FLAGYL) IVPB 500 mg  Status:  Discontinued        500 mg 100 mL/hr over 60 Minutes Intravenous Every 8 hours 01/03/12 1525 01/23/12 1155   12/31/11 1100   vancomycin (VANCOCIN) 750 mg in sodium chloride 0.9 % 150 mL IVPB  Status:  Discontinued        750 mg 150 mL/hr over 60 Minutes Intravenous Every 12 hours 12/31/11 0958 01/03/12 0849   12/30/11 1000   piperacillin-tazobactam (ZOSYN) IVPB 3.375 g  Status:  Discontinued        3.375 g 12.5 mL/hr over 240 Minutes Intravenous Every 8 hours 12/30/11 0901 01/23/12 1155          Assessment/Plan: s/p right peritoneal fluid coll drainage 10/1; check f/u CT on 10/21 as per [facility] note   LOS: 41 days    ALLRED,D [facility] 02/05/2012

## 2012-02-05 NOTE — Progress Notes (Signed)
Patient interviewed and examined, agree with PA note above. Currently some increase in pain.  Will check lab in AM, observe for now Mariella Saa MD, [facility]  02/05/2012 7:57 PM

## 2012-02-06 LAB — CBC WITH DIFFERENTIAL/PLATELET
Basophils Absolute: 0.1 10*3/uL (ref 0.0–0.1)
Basophils Relative: 1 % (ref 0–1)
Eosinophils Absolute: 0.4 10*3/uL (ref 0.0–0.7)
HCT: 32 % — ABNORMAL LOW (ref 36.0–46.0)
Hemoglobin: 10 g/dL — ABNORMAL LOW (ref 12.0–15.0)
MCH: 28 pg (ref 26.0–34.0)
MCHC: 31.3 g/dL (ref 30.0–36.0)
Monocytes Absolute: 1 10*3/uL (ref 0.1–1.0)
Monocytes Relative: 10 % (ref 3–12)
Neutrophils Relative %: 53 % (ref 43–77)
RDW: 16.8 % — ABNORMAL HIGH (ref 11.5–15.5)

## 2012-02-06 LAB — COMPREHENSIVE METABOLIC PANEL
ALT: 9 U/L (ref 0–35)
AST: 22 U/L (ref 0–37)
Alkaline Phosphatase: 119 U/L — ABNORMAL HIGH (ref 39–117)
CO2: 25 mEq/L (ref 19–32)
Calcium: 9.3 mg/dL (ref 8.4–10.5)
GFR calc Af Amer: >90 mL/min (ref >90)
GFR calc non Af Amer: >90 mL/min (ref >90)
Glucose, Bld: 111 mg/dL — ABNORMAL HIGH (ref 70–99)
Potassium: 4 mEq/L (ref 3.5–5.1)
Sodium: 133 mEq/L — ABNORMAL LOW (ref 135–145)

## 2012-02-06 LAB — GLUCOSE, CAPILLARY
Glucose-Capillary: 127 mg/dL — ABNORMAL HIGH (ref 70–99)
Glucose-Capillary: 92 mg/dL (ref 70–99)

## 2012-02-06 NOTE — Progress Notes (Signed)
TNA Protocol   Ms. Trickey has been on TNA starting 9/16 for pancreatitis.  Per Surgery on Monday, the plan was to wean TNA to 40 ml/hr with plan to wean to off this weekend.    Communicated with RN, pt with 'good' diet this AM, eating her breakfast.  Communicated with Dr. Johna Sheriff, ok to wean TNA to off tonight at 1800.  Plan:  Continue current TNA at rate of 40 ml/hr, at 1800 tonight, will wean TNA to OFF.  Will d/c TNA labs.    Pharmacy will sign off  Thanks  Geoffry Paradise, PharmD, BCPS Pager: [phone] 11:26 AM Pharmacy #: [phone]

## 2012-02-06 NOTE — Progress Notes (Signed)
Patient ID: [name], female   DOB: 1956/01/26, 56 y.o.   MRN: [id number] [facility] Surgery Progress Note:   31 Days Post-Op  Subjective: Mental status is clear.  Pain resolution remains an issue Objective: Vital signs in last 24 hours: Temp:  [98.4 F (36.9 C)-98.7 F (37.1 C)] 98.4 F (36.9 C) (10/19 0619) Pulse Rate:  [83-92] 83  (10/19 0619) Resp:  [18-20] 20  (10/19 0619) BP: (95-108)/(52-69) 99/52 mmHg (10/19 0619) SpO2:  [93 %-94 %] 93 % (10/18 2223)  Intake/Output from previous day: 10/18 0701 - 10/19 0700 In: 5239.6 [I.V.:1457.3; IV Piggyback:4; BJY:7829.5] Out: 2150 [Urine:2150] Intake/Output this shift:    Physical Exam: Work of breathing is normal.  3 drains in place that have minimal output that is debris ridden and somewhat cloudy.    Lab Results:  Results for orders placed during the hospital encounter of 12/26/11 (from the past 48 hour(s))  GLUCOSE, CAPILLARY     Status: Abnormal   Collection Time   02/04/12  7:40 AM      Component Value Range Comment   Glucose-Capillary 113 (*) 70 - 99 mg/dL    Comment 1 Notify RN     GLUCOSE, CAPILLARY     Status: Abnormal   Collection Time   02/04/12 11:19 AM      Component Value Range Comment   Glucose-Capillary 118 (*) 70 - 99 mg/dL    Comment 1 Notify RN     GLUCOSE, CAPILLARY     Status: Abnormal   Collection Time   02/04/12  4:35 PM      Component Value Range Comment   Glucose-Capillary 116 (*) 70 - 99 mg/dL    Comment 1 Notify RN     GLUCOSE, CAPILLARY     Status: Abnormal   Collection Time   02/04/12  9:28 PM      Component Value Range Comment   Glucose-Capillary 128 (*) 70 - 99 mg/dL    Comment 1 Notify RN     GLUCOSE, CAPILLARY     Status: Abnormal   Collection Time   02/05/12  8:11 AM      Component Value Range Comment   Glucose-Capillary 109 (*) 70 - 99 mg/dL   GLUCOSE, CAPILLARY     Status: Abnormal   Collection Time   02/05/12 12:38 PM      Component Value Range Comment   Glucose-Capillary 116 (*) 70 - 99 mg/dL   GLUCOSE, CAPILLARY     Status: Abnormal   Collection Time   02/05/12  5:28 PM      Component Value Range Comment   Glucose-Capillary 102 (*) 70 - 99 mg/dL   GLUCOSE, CAPILLARY     Status: Abnormal   Collection Time   02/05/12 11:12 PM      Component Value Range Comment   Glucose-Capillary 105 (*) 70 - 99 mg/dL   COMPREHENSIVE METABOLIC PANEL     Status: Abnormal   Collection Time   02/06/12  5:11 AM      Component Value Range Comment   Sodium 133 (*) 135 - 145 mEq/L    Potassium 4.0  3.5 - 5.1 mEq/L    Chloride 100  96 - 112 mEq/L    CO2 25  19 - 32 mEq/L    Glucose, Bld 111 (*) 70 - 99 mg/dL    BUN 10  6 - 23 mg/dL    Creatinine, Ser 6.21  0.50 - 1.10 mg/dL    Calcium  9.3  8.4 - 10.5 mg/dL    Total Protein 7.9  6.0 - 8.3 g/dL    Albumin 2.3 (*) 3.5 - 5.2 g/dL    AST 22  0 - 37 U/L    ALT 9  0 - 35 U/L    Alkaline Phosphatase 119 (*) 39 - 117 U/L    Total Bilirubin 0.2 (*) 0.3 - 1.2 mg/dL    GFR calc non Af Amer >90  >90 mL/min    GFR calc Af Amer >90  >90 mL/min   CBC WITH DIFFERENTIAL     Status: Abnormal   Collection Time   02/06/12  5:11 AM      Component Value Range Comment   WBC 10.2  4.0 - 10.5 K/uL    RBC 3.57 (*) 3.87 - 5.11 MIL/uL    Hemoglobin 10.0 (*) 12.0 - 15.0 g/dL    HCT 16.1 (*) 09.6 - 46.0 %    MCV 89.6  78.0 - 100.0 fL    MCH 28.0  26.0 - 34.0 pg    MCHC 31.3  30.0 - 36.0 g/dL    RDW 04.5 (*) 40.9 - 15.5 %    Platelets 467 (*) 150 - 400 K/uL    Neutrophils Relative 53  43 - 77 %    Neutro Abs 5.4  1.7 - 7.7 K/uL    Lymphocytes Relative 33  12 - 46 %    Lymphs Abs 3.3  0.7 - 4.0 K/uL    Monocytes Relative 10  3 - 12 %    Monocytes Absolute 1.0  0.1 - 1.0 K/uL    Eosinophils Relative 4  0 - 5 %    Eosinophils Absolute 0.4  0.0 - 0.7 K/uL    Basophils Relative 1  0 - 1 %    Basophils Absolute 0.1  0.0 - 0.1 K/uL     Radiology/Results: No results found.  Anti-infectives: Anti-infectives     Start      Dose/Rate Route Frequency Ordered Stop   01/23/12 1800  piperacillin-tazobactam (ZOSYN) IVPB 3.375 g       3.375 g 12.5 mL/hr over 240 Minutes Intravenous Every 8 hours 01/23/12 1155 01/30/12 1452   01/03/12 1600   metroNIDAZOLE (FLAGYL) IVPB 500 mg  Status:  Discontinued        500 mg 100 mL/hr over 60 Minutes Intravenous Every 8 hours 01/03/12 1525 01/23/12 1155   12/31/11 1100   vancomycin (VANCOCIN) 750 mg in sodium chloride 0.9 % 150 mL IVPB  Status:  Discontinued        750 mg 150 mL/hr over 60 Minutes Intravenous Every 12 hours 12/31/11 0958 01/03/12 0849   12/30/11 1000   piperacillin-tazobactam (ZOSYN) IVPB 3.375 g  Status:  Discontinued        3.375 g 12.5 mL/hr over 240 Minutes Intravenous Every 8 hours 12/30/11 0901 01/23/12 1155          Assessment/Plan: Problem List: Patient Active Problem List  Diagnosis  . DEPRESSION  . ESOPHAGEAL MOTILITY DISORDER  . HIATAL HERNIA  . FATTY LIVER DISEASE  . CHEST PAIN, NON-CARDIAC  . DIARRHEA  . ALCOHOL ABUSE, HX OF  . Alcohol abuse  . Pancreatitis caused by a combination of alcoholism, obstruction of the common bile duct by blood clots and a small stone, and extrinsic compression of the common bile duct by pancreatic pseudocyst  . Elevated liver function tests  . GERD (gastroesophageal reflux disease)  . Delirium tremens  .  UTI (lower urinary tract infection)  . Pseudocyst of pancreas  . Acute blood loss anemia  . Depression  . Fatty liver  . Acute pancreatitis  . Epigastric abdominal pain  . Leukocytosis  . Hyponatremia  . Acute respiratory failure  . Shock  . ARDS (adult respiratory distress syndrome)  . C. difficile colitis  . S/P laparotomy  . Gastric ulcer  . Physical deconditioning  . Anemia  . Protein-calorie malnutrition, moderate    Pancreatitis with drains in place that may remain there for a long time.  Many of her problems listed above have resolved.   31 Days Post-Op    LOS: 42 days   Matt  B. Daphine Deutscher, MD, [facility] [phone] beeper [phone]  02/06/2012 7:15 AM

## 2012-02-06 NOTE — Progress Notes (Signed)
At this time, I flush lower left abd. Drain (the non-J-p-type drain) with sterile saline with clean technique.  This drain is easily flushed--no resistense met--and it returns pink, cloudy drng. With bits of sm. Mucoid tissue vs. Purulence.  This procedure, which was performed with 10cc nss causes no pain to pt., and is tol. Quite well.

## 2012-02-07 LAB — GLUCOSE, CAPILLARY
Glucose-Capillary: 93 mg/dL (ref 70–99)
Glucose-Capillary: 106 mg/dL — ABNORMAL HIGH (ref 70–99)

## 2012-02-07 NOTE — Progress Notes (Signed)
32 Days Post-Op  Subjective: Pt without new c/o  Objective: Vital signs in last 24 hours: Temp:  [98.1 F (36.7 C)-99.5 F (37.5 C)] 98.1 F (36.7 C) (10/20 0600) Pulse Rate:  [87-91] 91  (10/20 0600) Resp:  [18] 18  (10/20 0600) BP: (96-98)/(60-65) 96/62 mmHg (10/20 0600) SpO2:  [90 %-93 %] 90 % (10/20 0600) Last BM Date: 02/05/12  Intake/Output from previous day: 10/19 0701 - 10/20 0700 In: 508 [I.V.:492; IV Piggyback:6] Out: 480 [Urine:450] Intake/Output this shift: Total I/O In: 120 [P.O.:120] Out: -   Rt perit drain intact, output about 30 cc's  Lab Results:   Basename 02/06/12 0511  WBC 10.2  HGB 10.0*  HCT 32.0*  PLT 467*   BMET  Basename 02/06/12 0511  NA 133*  K 4.0  CL 100  CO2 25  GLUCOSE 111*  BUN 10  CREATININE 0.51  CALCIUM 9.3   PT/INR No results found for this basename: LABPROT:2,INR:2 in the last 72 hours ABG No results found for this basename: PHART:2,PCO2:2,PO2:2,HCO3:2 in the last 72 hours Results for orders placed during the hospital encounter of 12/26/11  URINE CULTURE     Status: Normal   Collection Time   12/27/11  9:37 PM      Component Value Range Status Comment   Specimen Description URINE, RANDOM   Final    Special Requests NONE   Final    Culture  Setup Time 12/28/2011 09:31   Final    Colony Count 45,000 COLONIES/ML   Final    Culture     Final    Value: Multiple bacterial morphotypes present, none predominant. Suggest appropriate recollection if clinically indicated.   Report Status 12/29/2011 FINAL   Final   CULTURE, BLOOD (ROUTINE X 2)     Status: Normal   Collection Time   12/30/11  9:50 AM      Component Value Range Status Comment   Specimen Description BLOOD RIGHT ARM   Final    Special Requests BOTTLES DRAWN AEROBIC AND ANAEROBIC 5CC   Final    Culture  Setup Time 12/30/2011 14:02   Final    Culture NO GROWTH 5 DAYS   Final    Report Status 01/05/2012 FINAL   Final   CULTURE, BLOOD (ROUTINE X 2)     Status:  Normal   Collection Time   12/30/11 10:00 AM      Component Value Range Status Comment   Specimen Description BLOOD LEFT ARM   Final    Special Requests BOTTLES DRAWN AEROBIC AND ANAEROBIC 5CC   Final    Culture  Setup Time 12/30/2011 14:02   Final    Culture NO GROWTH 5 DAYS   Final    Report Status 01/05/2012 FINAL   Final   MRSA PCR SCREENING     Status: Normal   Collection Time   12/31/11 11:27 AM      Component Value Range Status Comment   MRSA by PCR NEGATIVE  NEGATIVE Final   URINE CULTURE     Status: Normal   Collection Time   01/01/12  9:30 PM      Component Value Range Status Comment   Specimen Description URINE, CATHETERIZED   Final    Special Requests Normal   Final    Culture  Setup Time 01/02/2012 01:51   Final    Colony Count NO GROWTH   Final    Culture NO GROWTH   Final    Report Status 01/03/2012  FINAL   Final   CULTURE, BLOOD (ROUTINE X 2)     Status: Normal   Collection Time   01/01/12 10:45 PM      Component Value Range Status Comment   Specimen Description LEFT ANTECUBITAL   Final    Special Requests BOTTLES DRAWN AEROBIC AND ANAEROBIC 3 CC EA   Final    Culture  Setup Time 01/02/2012 03:49   Final    Culture NO GROWTH 5 DAYS   Final    Report Status 01/08/2012 FINAL   Final   FUNGUS CULTURE, BLOOD     Status: Normal   Collection Time   01/01/12 10:45 PM      Component Value Range Status Comment   Specimen Description LEFT ANTECUBITAL   Final    Special Requests BOTTLES DRAWN AEROBIC ONLY 3 CC EA   Final    Culture NO GROWTH 7 DAYS   Final    Report Status 01/10/2012 FINAL   Final   CULTURE, BLOOD (ROUTINE X 2)     Status: Normal   Collection Time   01/01/12 10:55 PM      Component Value Range Status Comment   Specimen Description RIGHT ANTECUBITAL   Final    Special Requests BOTTLES DRAWN AEROBIC AND ANAEROBIC 5 CC EA   Final    Culture  Setup Time 01/02/2012 03:49   Final    Culture NO GROWTH 5 DAYS   Final    Report Status 01/08/2012 FINAL   Final     CULTURE, RESPIRATORY     Status: Normal   Collection Time   01/01/12 11:35 PM      Component Value Range Status Comment   Specimen Description TRACHEAL ASPIRATE   Final    Special Requests Normal   Final    Gram Stain     Final    Value: RARE WBC PRESENT,BOTH PMN AND MONONUCLEAR     RARE SQUAMOUS EPITHELIAL CELLS PRESENT     RARE GRAM POSITIVE COCCI IN PAIRS   Culture Non-Pathogenic Oropharyngeal-type Flora Isolated.   Final    Report Status 01/04/2012 FINAL   Final   VRE CULTURE     Status: Normal   Collection Time   01/02/12  3:07 AM      Component Value Range Status Comment   Specimen Description RECTAL SWAB   Final    Special Requests Normal   Final    Culture NEGATIVE FOR VANCOMYCIN RESISTANT ENTEROCOCCI   Final    Report Status 01/06/2012 FINAL   Final   CLOSTRIDIUM DIFFICILE BY PCR     Status: Abnormal   Collection Time   01/02/12  4:54 PM      Component Value Range Status Comment   C difficile by pcr POSITIVE (*) NEGATIVE Final   BODY FLUID CULTURE     Status: Normal   Collection Time   01/03/12 10:00 AM      Component Value Range Status Comment   Specimen Description ASCITIC   Final    Special Requests NONE   Final    Gram Stain     Final    Value: NO WBC SEEN     NO ORGANISMS SEEN   Culture NO GROWTH 3 DAYS   Final    Report Status 01/06/2012 FINAL   Final   BODY FLUID CULTURE     Status: Normal   Collection Time   01/03/12  1:51 PM      Component Value Range Status  Comment   Specimen Description PERITONEAL   Final    Special Requests NONE   Final    Gram Stain     Final    Value: NO WBC SEEN     NO ORGANISMS SEEN   Culture NO GROWTH 3 DAYS   Final    Report Status 01/07/2012 FINAL   Final   ANAEROBIC CULTURE     Status: Normal   Collection Time   01/03/12  1:52 PM      Component Value Range Status Comment   Specimen Description PERITONEAL FLUID   Final    Special Requests NONE   Final    Gram Stain     Final    Value: NO WBC SEEN     NO ORGANISMS SEEN    Culture NO ANAEROBES ISOLATED   Final    Report Status 01/08/2012 FINAL   Final   CULTURE, BLOOD (ROUTINE X 2)     Status: Normal   Collection Time   01/04/12  2:55 PM      Component Value Range Status Comment   Specimen Description BLOOD LEFT HAND   Final    Special Requests BOTTLES DRAWN AEROBIC AND ANAEROBIC 5.5CC   Final    Culture  Setup Time 01/04/2012 23:02   Final    Culture NO GROWTH 5 DAYS   Final    Report Status 01/10/2012 FINAL   Final   CULTURE, BLOOD (ROUTINE X 2)     Status: Normal   Collection Time   01/04/12  3:00 PM      Component Value Range Status Comment   Specimen Description BLOOD LEFT HAND   Final    Special Requests BOTTLES DRAWN AEROBIC AND ANAEROBIC 5.5CC   Final    Culture  Setup Time 01/04/2012 23:29   Final    Culture NO GROWTH 5 DAYS   Final    Report Status 01/10/2012 FINAL   Final   CULTURE, RESPIRATORY     Status: Normal   Collection Time   01/04/12  3:37 PM      Component Value Range Status Comment   Specimen Description ENDOTRACHEAL   Final    Special Requests NONE   Final    Gram Stain     Final    Value: RARE WBC PRESENT, PREDOMINANTLY MONONUCLEAR     NO SQUAMOUS EPITHELIAL CELLS SEEN     NO ORGANISMS SEEN   Culture Non-Pathogenic Oropharyngeal-type Flora Isolated.   Final    Report Status 01/07/2012 FINAL   Final   URINE CULTURE     Status: Normal   Collection Time   01/04/12  4:59 PM      Component Value Range Status Comment   Specimen Description URINE, CATHETERIZED   Final    Special Requests zosyn,flagyl   Final    Culture  Setup Time 01/05/2012 02:21   Final    Colony Count NO GROWTH   Final    Culture NO GROWTH   Final    Report Status 01/05/2012 FINAL   Final   ANAEROBIC CULTURE     Status: Normal   Collection Time   01/19/12  2:00 PM      Component Value Range Status Comment   Specimen Description PERITONEAL CAVITY   Final    Special Requests Normal   Final    Gram Stain     Final    Value: FEW WBC PRESENT,BOTH PMN AND  MONONUCLEAR     NO ORGANISMS  SEEN   Culture NO ANAEROBES ISOLATED   Final    Report Status 01/24/2012 FINAL   Final   BODY FLUID CULTURE     Status: Normal   Collection Time   01/19/12  2:00 PM      Component Value Range Status Comment   Specimen Description FLUID PERITONEAL CAVITY   Final    Special Requests NONE   Final    Gram Stain     Final    Value: FEW WBC PRESENT,BOTH PMN AND MONONUCLEAR     NO ORGANISMS SEEN   Culture NO GROWTH 3 DAYS   Final    Report Status 01/23/2012 FINAL   Final   CLOSTRIDIUM DIFFICILE BY PCR     Status: Normal   Collection Time   01/24/12 10:05 AM      Component Value Range Status Comment   C difficile by pcr NEGATIVE  NEGATIVE Final     Studies/Results: No results found.  Anti-infectives: Anti-infectives     Start     Dose/Rate Route Frequency Ordered Stop   01/23/12 1800  piperacillin-tazobactam (ZOSYN) IVPB 3.375 g       3.375 g 12.5 mL/hr over 240 Minutes Intravenous Every 8 hours 01/23/12 1155 01/30/12 1452   01/03/12 1600   metroNIDAZOLE (FLAGYL) IVPB 500 mg  Status:  Discontinued        500 mg 100 mL/hr over 60 Minutes Intravenous Every 8 hours 01/03/12 1525 01/23/12 1155   12/31/11 1100   vancomycin (VANCOCIN) 750 mg in sodium chloride 0.9 % 150 mL IVPB  Status:  Discontinued        750 mg 150 mL/hr over 60 Minutes Intravenous Every 12 hours 12/31/11 0958 01/03/12 0849   12/30/11 1000   piperacillin-tazobactam (ZOSYN) IVPB 3.375 g  Status:  Discontinued        3.375 g 12.5 mL/hr over 240 Minutes Intravenous Every 8 hours 12/30/11 0901 01/23/12 1155          Assessment/Plan: s/p right peritoneal fluid coll drainage 10/1; check f/u CT 10/21 with poss drain injection   LOS: 43 days    ALLRED,D [facility] 02/07/2012

## 2012-02-07 NOTE — Progress Notes (Signed)
Patient ID: [name], female   DOB: [birth year], 56 y.o.   MRN: [id number] 32 Days Post-Op  Subjective: Eating pretty well with just some occasional nausea. Has ongoing pain but pretty well controlled with meds TNA off  Objective: Vital signs in last 24 hours: Temp:  [98.1 F (36.7 C)-99.5 F (37.5 C)] 98.1 F (36.7 C) (10/20 0600) Pulse Rate:  [87-91] 91  (10/20 0600) Resp:  [18] 18  (10/20 0600) BP: (96-98)/(60-65) 96/62 mmHg (10/20 0600) SpO2:  [90 %-93 %] 90 % (10/20 0600) Last BM Date: 02/05/12 ("2 days ago")  Intake/Output from previous day: 10/19 0701 - 10/20 0700 In: 508 [I.V.:492; IV Piggyback:6] Out: 480 [Urine:450] Intake/Output this shift:    General appearance: alert, cooperative and no distress GI: normal findings: soft, non-tender Incision/Wound: Packed, clean.  Minimal drainage from two JPs.  20cc/d srous drainage from lower drain  Lab Results:   [facility] 02/06/12 0511  WBC 10.2  HGB 10.0*  HCT 32.0*  PLT 467*   BMET  Basename 02/06/12 0511  NA 133*  K 4.0  CL 100  CO2 25  GLUCOSE 111*  BUN 10  CREATININE 0.51  CALCIUM 9.3   Lab Results  Component Value Date   ALT 9 02/06/2012   AST 22 02/06/2012   ALKPHOS 119* 02/06/2012   BILITOT 0.2* 02/06/2012     Studies/Results: No results found.  Anti-infectives: Anti-infectives     Start     Dose/Rate Route Frequency Ordered Stop   01/23/12 1800  piperacillin-tazobactam (ZOSYN) IVPB 3.375 g       3.375 g 12.5 mL/hr over 240 Minutes Intravenous Every 8 hours 01/23/12 1155 01/30/12 1452   01/03/12 1600   metroNIDAZOLE (FLAGYL) IVPB 500 mg  Status:  Discontinued        500 mg 100 mL/hr over 60 Minutes Intravenous Every 8 hours 01/03/12 1525 01/23/12 1155   12/31/11 1100   vancomycin (VANCOCIN) 750 mg in sodium chloride 0.9 % 150 mL IVPB  Status:  Discontinued        750 mg 150 mL/hr over 60 Minutes Intravenous Every 12 hours 12/31/11 0958 01/03/12 0849   12/30/11 1000    piperacillin-tazobactam (ZOSYN) IVPB 3.375 g  Status:  Discontinued        3.375 g 12.5 mL/hr over 240 Minutes Intravenous Every 8 hours 12/30/11 0901 01/23/12 1155          Assessment/Plan: s/p Procedure(s): Open drainage infected pancreatic pseudocyst Continued improvement Should be ready for discharge soon Would repeat CT this week, consider injecting drains if abscess resolved and remove drains if no pancreatic duct communication    LOS: 43 days    Demontre Padin T 02/07/2012

## 2012-02-08 ENCOUNTER — Inpatient Hospital Stay (HOSPITAL_COMMUNITY): Payer: MEDICAID

## 2012-02-08 LAB — GLUCOSE, CAPILLARY: Glucose-Capillary: 82 mg/dL (ref 70–99)

## 2012-02-08 MED ORDER — OXYCODONE HCL 5 MG PO TABS: 5.0000 mg | ORAL_TABLET | ORAL | Status: DC | PRN

## 2012-02-08 MED ORDER — PROMETHAZINE HCL 12.5 MG PO TABS: 12.5000 mg | ORAL_TABLET | Freq: Four times a day (QID) | ORAL | Status: DC | PRN

## 2012-02-08 MED ORDER — METOCLOPRAMIDE HCL 10 MG PO TABS: 5.0000 mg | ORAL_TABLET | Freq: Three times a day (TID) | ORAL | Status: DC

## 2012-02-08 MED ORDER — FENTANYL 25 MCG/HR TD PT72: 1.0000 | MEDICATED_PATCH | TRANSDERMAL | Status: DC

## 2012-02-08 MED ORDER — METOCLOPRAMIDE HCL 5 MG PO TABS
5.0000 mg | ORAL_TABLET | Freq: Three times a day (TID) | ORAL | Status: DC
  Administered 2012-02-08 – 2012-02-09 (×3): 5 mg via ORAL
  Filled 2012-02-08 (×6): qty 1

## 2012-02-08 MED ORDER — LORAZEPAM 1 MG PO TABS: 1.0000 mg | ORAL_TABLET | Freq: Three times a day (TID) | ORAL | Status: DC

## 2012-02-08 MED ORDER — IOHEXOL 300 MG/ML  SOLN
100.0000 mL | Freq: Once | INTRAMUSCULAR | Status: AC | PRN
  Administered 2012-02-08: 100 mL via INTRAVENOUS

## 2012-02-08 NOTE — Progress Notes (Signed)
Patient ID: [name], female   DOB: [birth year], 56 y.o.   MRN: [id number] 33 Days Post-Op  Subjective: Pt feels ok.  A little bit of nausea, which is chronic.  Otherwise feels ok.  Objective: Vital signs in last 24 hours: Temp:  [98.1 F (36.7 C)-99.4 F (37.4 C)] 98.1 F (36.7 C) (10/21 0600) Pulse Rate:  [81-90] 85  (10/21 0600) Resp:  [18] 18  (10/21 0600) BP: (97-101)/(64-65) 97/65 mmHg (10/21 0600) SpO2:  [90 %-92 %] 90 % (10/21 0600) Last BM Date: 02/05/12  Intake/Output from previous day: 10/20 0701 - 10/21 0700 In: 380 [P.O.:120; I.V.:260] Out: -  Intake/Output this shift:    PE: Abd: soft, JP 1 and 2 with tan cloudy purulent drainage. Perc drain with serosang output and some tissue present.  Wound is mostly clean.  Superior portion with some fibrin.  Fascial sutures are minimally visible.  Lab Results:   [facility] 02/06/12 0511  WBC 10.2  HGB 10.0*  HCT 32.0*  PLT 467*   BMET  Basename 02/06/12 0511  NA 133*  K 4.0  CL 100  CO2 25  GLUCOSE 111*  BUN 10  CREATININE 0.51  CALCIUM 9.3   PT/INR No results found for this basename: LABPROT:2,INR:2 in the last 72 hours CMP     Component Value Date/Time   NA 133* 02/06/2012 0511   K 4.0 02/06/2012 0511   CL 100 02/06/2012 0511   CO2 25 02/06/2012 0511   GLUCOSE 111* 02/06/2012 0511   BUN 10 02/06/2012 0511   CREATININE 0.51 02/06/2012 0511   CALCIUM 9.3 02/06/2012 0511   PROT 7.9 02/06/2012 0511   ALBUMIN 2.3* 02/06/2012 0511   AST 22 02/06/2012 0511   ALT 9 02/06/2012 0511   ALKPHOS 119* 02/06/2012 0511   BILITOT 0.2* 02/06/2012 0511   GFRNONAA >90 02/06/2012 0511   GFRAA >90 02/06/2012 0511   Lipase     Component Value Date/Time   LIPASE 113* 01/11/2012 0540       Studies/Results: No results found.  Anti-infectives: Anti-infectives     Start     Dose/Rate Route Frequency Ordered Stop   01/23/12 1800  piperacillin-tazobactam (ZOSYN) IVPB 3.375 g       3.375 g 12.5 mL/hr  over 240 Minutes Intravenous Every 8 hours 01/23/12 1155 01/30/12 1452   01/03/12 1600   metroNIDAZOLE (FLAGYL) IVPB 500 mg  Status:  Discontinued        500 mg 100 mL/hr over 60 Minutes Intravenous Every 8 hours 01/03/12 1525 01/23/12 1155   12/31/11 1100   vancomycin (VANCOCIN) 750 mg in sodium chloride 0.9 % 150 mL IVPB  Status:  Discontinued        750 mg 150 mL/hr over 60 Minutes Intravenous Every 12 hours 12/31/11 0958 01/03/12 0849   12/30/11 1000   piperacillin-tazobactam (ZOSYN) IVPB 3.375 g  Status:  Discontinued        3.375 g 12.5 mL/hr over 240 Minutes Intravenous Every 8 hours 12/30/11 0901 01/23/12 1155           Assessment/Plan  1. S/p ex lap with debridement of pancreas for ruptured pancreatic pseudocyst. 2. PCM, improving 3. Deconditioning 4. Multiple intra-abd fluid collections  Plan: 1. Repeat CT scan today. 2. Patient is going to arrange for a ride to come pick her up tomorrow.  If not, we will need to get the patient a taxi voucher to get her home. 3. Follow up with Dr. Daphine Deutscher next week.  LOS: 44 days    Javelle Donigan E 02/08/2012, 8:21 AM Pager: [phone]

## 2012-02-08 NOTE — Discharge Summary (Signed)
Patient ID: [name] MRN: [id number] DOB/AGE: Jun 30, 1955 56 y.o.  Admit date: 12/26/2011 Discharge date: 02/09/2012  Of note, most information is obtained by internal medicine's interim dc summary that covered from 12-26-2011 until we took over the patient on 01-26-12  Procedures:  9/12 RRT and subsequent transfer to the ICU.  9/14 TFs held due to firm abdomen & sub glottic suctioning, WC higher  9/14 CT abd: increased ascites, Persistent edema/inflammation surrounding the head of the pancreas, with an approximate 1.8 x 2.6 cm pseudocyst at the junction of the head and body. Developing pseudocyst in the pancreatic head measuring approximately 1.3 x 2.6 cm, Large hiatal hernia.  9/14 doppler UE neg for DVT  9/15 1.5 L paracentesis - green brown fluid  9/15 Exploratory laparotomy with debridement of necrotic pancreas and drainage of the upper abdomen  9/18 EGD (Ganem) - 10-12 mm ulcer in the cardia of the stomach, protonix gtt, removed panda  9/20 CT abd: Decrease in moderate abdominal and pelvic ascites with intraperitoneal drainage catheters now seen in place. Mild increase in small bilateral pleural effusions. No significant change in diffuse body wall edema. Persistent small pseudocyst in the area the pancreatic head, which is decreased in size since earlier exam  9/23 extubated  9/24 dc fentanyl and start dilaudid.  9/24 change to SDU status.  9/26 transferred to [name]  10/1 Additional Drain Placed by IR   Consults: pulmonary/intensive care, GI and general surgery  Reason for Admission: 55 yowf H/O ERCP with sphincterotomy on 10/06/2011. She was adm by [facility] via [facility] ER on 09/07 with acute pancreatitis. On 9/12 RRT was called for respiratory decline and lethargy, subsequently required mechanical ventilation & Exploratory laparotomy 9/15 with debridement of necrotic pancreas and drainage  Hospital Course:  Present on Admission:  Ruptured pancreatic pseudocyst with peritonitis  -s/p  Exploratory laparotomy with debridement of necrotic pancreas and drainage of the upper abdomen 01/03/12 Dr. Wenda Low.  -Post op drainage from pseudocyst  -Post op VDRF on Vent, extubated 01/11/12  -Completed 15 days of flagyl  -Currently on day 27 of Zosyn  - continue nutrition mgmt per surgery-TNA  - pt tolerating Clears liquids  - pain meds IV (mgmt per CCS)  - CT abdomen 9/30: new collection in abdomen -Pt had new drain placemed By IR on 10/1  The patient diet was advanced up to a solid diet from clear liquids.  This caused her an increase in abdominal pain.  Her diet was backed off to clears again.  Her pain medicine was adjusted from IV dilaudid to a fentanyl patch and oral oxycodone.  This seemed to control her pain much better and her diet was once again able to be advanced back up to a solid.  Once she was able to take in enough calories, her TNA was able to be weaned.  She continues to have all 3 drains in place as repeat CT scans have not shown enough resolution of these fluid collections to remove any of these prior to discharge home.  She is otherwise stable for discharge home on 02-09-12.  She will have extensive HH set up along with meals on wheels and an ambulance service availability for transport to and from appointments.  This has been set up by SW and CM as the patient is not able to go to SNF.  Anemia  - s/p 3 unit PRBC 9/28, 10/4  - following CBC  - Hg holding stable about 9.5-10 after transfusions  Gastric Ulcer/GERD  -  EGD showed Gastric ulcer  - continue BID PPI treatment  Deconditioning/Weakness/Immobility  - CIR consult appreciated, pt not a candidate for inpatient rehab.  - Plans for SNF at time of D/C  - CCS team is increasing ambulation  - SCDs for DVT prophylaxis  - encouraged more ambulation  C. Diff colitis  - completed 15 days of Flagyl  - repeat c.diff test was negative isolation discontinued.  Septic Shock  - resolved now  Chronic Narcotic Dependence    - IV dilaudid for pain  - pain mgmt per surgery  -pt able to go home with fentanyl patch and oral oxycodone ARDS / Acute Resp Failure  - pt off vent since 9/23  -monitoring closely post extubation  -now without oxygen requirements  Hx of pancreatitis, and alcohol abuse  -See above  -Counseled against further ETOH use.  Hyponatremia  - volume overloaded  - monitoring closely  - BMP in AM  -improved  Elevated liver function tests  - LFTs have returned to normal  Hiatal Hernia  9/18 EGD (Ganem) - 10-12 mm ulcer in the cardia of the stomach, protonix gtt, removed panda    Discharge Diagnoses:  Patient Active Problem List   Diagnosis   .  DEPRESSION   .  ESOPHAGEAL MOTILITY DISORDER   .  HIATAL HERNIA   .  FATTY LIVER DISEASE   .  CHEST PAIN, NON-CARDIAC   .  DIARRHEA   .  ALCOHOL ABUSE, HX OF   .  Alcohol abuse   .  Pancreatitis caused by a combination of alcoholism, obstruction of the common bile duct by blood clots and a small stone, and extrinsic compression of the common bile duct by pancreatic pseudocyst   .  Elevated liver function tests   .  GERD (gastroesophageal reflux disease)   .  Delirium tremens   .  UTI (lower urinary tract infection)   .  Pseudocyst of pancreas   .  Acute blood loss anemia   .  Depression   .  Fatty liver   .  Acute pancreatitis   .  Epigastric abdominal pain   .  Leukocytosis   .  Hyponatremia   .  Acute respiratory failure   .  Shock   .  ARDS (adult respiratory distress syndrome)   .  C. difficile colitis   .  S/P laparotomy   .  Gastric ulcer   .  Physical deconditioning   .  Anemia   .  Protein-calorie malnutrition, moderate   Intra-abdominal abscesses, s/p perc drain S/p ex lap with pancreatic debridement   Discharge Medications:   Medication List     As of 02/08/2012 11:28 AM    TAKE these medications         fentaNYL 25 MCG/HR   Commonly known as: DURAGESIC - dosed mcg/hr   Place 1 patch (25 mcg total) onto the  skin every 3 (three) days.      LORazepam 1 MG tablet   Commonly known as: ATIVAN   Take 1 mg by mouth 2 (two) times daily as needed. Anxiety      LORazepam 1 MG tablet   Commonly known as: ATIVAN   Take 1 tablet (1 mg total) by mouth 3 (three) times daily.      metoCLOPramide 10 MG tablet   Commonly known as: REGLAN   Take 0.5 tablets (5 mg total) by mouth 3 (three) times daily before meals. For nausea  oxyCODONE 5 MG immediate release tablet   Commonly known as: Oxy IR/ROXICODONE   Take 1-2 tablets (5-10 mg total) by mouth every 4 (four) hours as needed.      promethazine 12.5 MG tablet   Commonly known as: PHENERGAN   Take 1-2 tablets (12.5-25 mg total) by mouth every 6 (six) hours as needed for nausea.        Discharge Instructions:     Follow-up Information    Follow up with Valarie Merino, MD. On 02/18/2012. (12:20pm, arrive at 12:00pm)    Contact information:   [address] [place] [address] [phone]          Signed: Letha Cape 02/08/2012, 11:28 AM

## 2012-02-08 NOTE — Progress Notes (Signed)
Physical Therapy Treatment Patient Details Name: [name] MRN: [id number] DOB: Sep 17, 1955 Today's Date: 02/08/2012 Time: 7829-5621 PT Time Calculation (min): 30 min  PT Assessment / Plan / Recommendation Comments on Treatment Session  Stability/balance slowly improving. Pt still unsteady intermittently. will continue to gait train without AD. Pt has stated she does not feel she can use a walker (will not fit) in her home. Pt stated she has 4 steps to enter home through one entrance; other entrance has 4 flights-will plan on using entrance with least steps. Encouraged pt to ambulate in hallway with nursing (with or without holding onto IV pole). HHPT, home health aide.     Follow Up Recommendations  Home health PT with Intermittent Supervision     Does the patient have the potential to tolerate intense rehabilitation     Barriers to Discharge        Equipment Recommendations       Recommendations for Other Services    Frequency Min 3X/week   Plan Discharge plan remains appropriate    Precautions / Restrictions Precautions Precautions: Fall Restrictions Weight Bearing Restrictions: No   Pertinent Vitals/Pain 8/10 abdomen    Mobility  Bed Mobility Bed Mobility: Supine to Sit Supine to Sit: 6: Modified independent (Device/Increase time) Transfers Transfers: Sit to Stand;Stand to Sit Sit to Stand: 6: Modified independent (Device/Increase time) Stand to Sit: 6: Modified independent (Device/Increase time) Ambulation/Gait Ambulation/Gait Assistance: 4: Min guard;5: Supervision Ambulation Distance (Feet): 400 Feet Ambulation/Gait Assistance Details: Min-guard for longer distance. Supervision for shorter distances (bed to bathroom). Slow gait speed. Still guarded at times.  General Gait Details: Had pt perform head turns, changes in direction (forward, backwards, turns), stop-n-go, slowed/faster speeds while ambulating.  Stairs: Yes Stairs Assistance: 5:  Supervision Stair Management Technique: One rail Left Number of Stairs: 4  (2x2)    Exercises General Exercises - Lower Extremity Long Arc Quad: AROM;Both;15 reps;Seated Hip ABduction/ADduction: AROM;Both;15 reps;Standing Hip Flexion/Marching: AROM;Both;15 reps;Standing Heel Raises: AROM;Both;15 reps;Standing Mini-Sqauts: AROM;Both;Standing Other Exercises Other Exercises: Knee flexion, 15 reps, standing, both    PT Diagnosis:    PT Problem List:   PT Treatment Interventions:     PT Goals Acute Rehab PT Goals Pt will go Supine/Side to Sit: with modified independence;with HOB 0 degrees PT Goal: Supine/Side to Sit - Progress: Progressing toward goal Pt will go Sit to Stand: with modified independence PT Goal: Sit to Stand - Progress: Met Pt will go Stand to Sit: with modified independence PT Goal: Stand to Sit - Progress: Met Pt will Ambulate: >150 feet;with modified independence PT Goal: Ambulate - Progress: Progressing toward goal Pt will Go Up / Down Stairs: 3-5 stairs;with modified independence;with rail(s) PT Goal: Up/Down Stairs - Progress: Goal set today  Visit Information  Last PT Received On: 02/08/12 Assistance Needed: +1    Subjective Data  Subjective: "I think I'm leaving tomorrow" Patient Stated Goal: Less pain.    Cognition  Overall Cognitive Status: Appears within functional limits for tasks assessed/performed Arousal/Alertness: Awake/alert Orientation Level: Appears intact for tasks assessed Behavior During Session: [name] for tasks performed    Balance  High Level Balance High Level Balance Activites: Backward walking;Direction changes;Turns;Sudden stops;Head turns  End of Session PT - End of Session Activity Tolerance: Patient tolerated treatment well Patient left: in chair;with call bell/phone within reach   GP     Rebeca Alert [facility] 02/08/2012, 2:50 PM [phone]

## 2012-02-08 NOTE — Progress Notes (Signed)
Occupational Therapy Treatment Patient Details Name: [name] MRN: [id number] DOB: 1956/01/19 Today's Date: 02/08/2012 Time: 6144-3154 OT Time Calculation (min): 15 min  OT Assessment / Plan / Recommendation Comments on Treatment Session Pt continuing to make steady progress towards goals. Con't to recommend HHOT for home safey eval.    Follow Up Recommendations  Home health OT    Barriers to Discharge       Equipment Recommendations  None recommended by OT    Recommendations for Other Services    Frequency Min 2X/week   Plan Discharge plan remains appropriate    Precautions / Restrictions Precautions Precautions: Fall Precaution Comments: abdominal drains Restrictions Weight Bearing Restrictions: No   Pertinent Vitals/Pain Reported 7/10 abdominal pain. Repositioned for comfort.    ADL  Grooming: Performed;Wash/dry hands;Teeth care;Independent Toilet Transfer: Simulated;Independent Toilet Transfer Method: Sit to Barista: Other (comment) (simulated with low tub chair.) Toileting - Clothing Manipulation and Hygiene: Performed;Independent Transfers/Ambulation Related to ADLs: Pt ambulated to the bathroom independently pushing IV pole. ADL Comments: Unable to complete full bath. Pt on her way to a procedure. Pt was able to rise from low toilet (simulated using low tub chair) without difficulty or UE assist.    OT Diagnosis:    OT Problem List:   OT Treatment Interventions:     OT Goals ADL Goals ADL Goal: Grooming - Progress: Met ADL Goal: Toilet Transfer - Progress: Met ADL Goal: Toileting - Clothing Manipulation - Progress: Met ADL Goal: Toileting - Hygiene - Progress: Met ADL Goal: Additional Goal #1 - Progress: Met Miscellaneous OT Goals OT Goal: Miscellaneous Goal #1 - Progress: Met OT Goal: Miscellaneous Goal #3 - Progress: Met OT Goal: Miscellaneous Goal #4 - Progress: Met Miscellaneous OT Goal #5: Pt will complete all  aspects of bathing and dressing including item retrieval at mod I level. OT Goal: Miscellaneous Goal #5 - Progress: Goal set today  Visit Information  Last OT Received On: 02/08/12 Assistance Needed: +1    Subjective Data  Subjective: I don't think I'm going to be able to take a shower because of this wound.   Prior Functioning       Cognition  Overall Cognitive Status: Appears within functional limits for tasks assessed/performed Arousal/Alertness: Awake/alert Orientation Level: Appears intact for tasks assessed Behavior During Session: [facility] for tasks performed    Mobility  Shoulder Instructions Bed Mobility Bed Mobility: Supine to Sit Supine to Sit: 6: Modified independent (Device/Increase time) Transfers Sit to Stand: 7: Independent;6: Modified independent (Device/Increase time);From elevated surface;From toilet;From chair/3-in-1 Stand to Sit: 6: Modified independent (Device/Increase time);7: Independent;To chair/3-in-1;To toilet       Exercises     Balance    End of Session OT - End of Session Activity Tolerance: Patient tolerated treatment well Patient left: in chair;with call bell/phone within reach  GO     Kenneth Cuaresma A OTR/L [phone] 02/08/2012, 3:59 PM

## 2012-02-09 NOTE — Progress Notes (Signed)
Patient discharged to home.  Reviewed discharge instructions with patient.  Instructed patient to pick up prescriptions on her way out of the hospital. Patient verbalizes understanding.  No further questions about discharge instructions at this time.  Right abdominal drain to gravity flushed with 10cc's of normal saline as instructed.  All abdominal dressings changed prior to discharge.  Patient escorted to lobby via wheelchair.  Patient discharged.

## 2012-02-18 ENCOUNTER — Ambulatory Visit (INDEPENDENT_AMBULATORY_CARE_PROVIDER_SITE_OTHER): Payer: Self-pay | Admitting: Surgery

## 2012-02-18 ENCOUNTER — Encounter (INDEPENDENT_AMBULATORY_CARE_PROVIDER_SITE_OTHER): Payer: Self-pay | Admitting: Surgery

## 2012-02-18 VITALS — BP 128/68 | HR 112 | Temp 98.0°F | Resp 18 | Ht 66.0 in | Wt 143.0 lb

## 2012-02-18 DIAGNOSIS — K859 Acute pancreatitis without necrosis or infection, unspecified: Secondary | ICD-10-CM

## 2012-02-18 NOTE — Progress Notes (Signed)
followup for [name] on whom I operated for pancreatitis. Her 2 JPs placed on the surgery her draining very little. I went ahead and remove those. He CT-guided drain is still in place. I told her she can start showering. I will repeat a CT scan on her and one week and determine if we can pull out his percutaneous drain.

## 2012-02-18 NOTE — Patient Instructions (Signed)
Acute Pancreatitis  Acute pancreatitis is a disease in which the pancreas becomes suddenly inflamed. The pancreas is a large gland located behind your stomach. The pancreas produces enzymes that help digest food. The pancreas also releases the hormones glucagon and insulin that help regulate blood sugar. Damage to the pancreas occurs when the digestive enzymes from the pancreas are activated and begin attacking the pancreas before being released into the intestine. Most acute attacks last a couple of days and can cause serious complications. Some people become dehydrated and develop low blood pressure. In severe cases, bleeding into the pancreas can lead to shock and can be life-threatening. The lungs, heart, and kidneys may fail.  CAUSES   Pancreatitis can happen to anyone. In some cases, the cause is unknown. Most cases are caused by:   Alcohol abuse.   Gallstones.  Other less common causes are:   Certain medicines.   Exposure to certain chemicals.   Infection.   Damage caused by an accident (trauma).   Abdominal surgery.  SYMPTOMS    Pain in the upper abdomen that may radiate to the back.   Tenderness and swelling of the abdomen.   Nausea and vomiting.  DIAGNOSIS   Your caregiver will perform a physical exam. Blood and stool tests may be done to confirm the diagnosis. Imaging tests may also be done, such as X-rays, CT scans, or an ultrasound of the abdomen.  TREATMENT   Treatment usually requires a stay in the hospital. Treatment may include:   Pain medicine.   Fluid replacement through an intravenous line (IV).   Placing a tube in the stomach to remove stomach contents and control vomiting.   Not eating for 3 or 4 days. This gives your pancreas a rest, because enzymes are not being produced that can cause further damage.   Antibiotic medicines if your condition is caused by an infection.   Surgery of the pancreas or gallbladder.  HOME CARE INSTRUCTIONS    Follow the diet advised by your  caregiver. This may involve avoiding alcohol and decreasing the amount of fat in your diet.   Eat smaller, more frequent meals. This reduces the amount of digestive juices the pancreas produces.   Drink enough fluids to keep your urine clear or pale yellow.   Only take over-the-counter or prescription medicines as directed by your caregiver.   Avoid drinking alcohol if it caused your condition.   Do not smoke.   Get plenty of rest.   Check your blood sugar at home as directed by your caregiver.   Keep all follow-up appointments as directed by your caregiver.  SEEK MEDICAL CARE IF:    You do not recover as quickly as expected.   You develop new or worsening symptoms.   You have persistent pain, weakness, or nausea.   You recover and then have another episode of pain.  SEEK IMMEDIATE MEDICAL CARE IF:    You are unable to eat or keep fluids down.   Your pain becomes severe.   You have a fever or persistent symptoms for more than 2 to 3 days.   You have a fever and your symptoms suddenly get worse.   Your skin or the white part of your eyes turn yellow (jaundice).   You develop vomiting.   You feel dizzy, or you faint.   Your blood sugar is high (over 300 mg/dL).  MAKE SURE YOU:    Understand these instructions.   Will watch your condition.   

## 2012-02-22 ENCOUNTER — Telehealth (INDEPENDENT_AMBULATORY_CARE_PROVIDER_SITE_OTHER): Payer: Self-pay | Admitting: General Surgery

## 2012-02-22 NOTE — Telephone Encounter (Signed)
Pt's pharmacist called to report that pt had Rx from another MD for Oxycodone # 120 issued 02/09/12.  (She had already picked up the Rx issued by [facility] for # 30.)  Pharmacist stated she will explain to the pt why they or any other pharmacy will not fill new script yet.  FYI.

## 2012-02-22 NOTE — Telephone Encounter (Signed)
Pt calling for pian med refill.  Per standing orders, called in Hydrocodone  5/325 mg,  # 30, 1-2 po Q 4-6 H prn pain, no refill to [facility]:  [phone].

## 2012-02-22 NOTE — Telephone Encounter (Signed)
Pt calling from the pharmacy.  She cannot take Tylenol, secondary to existing liver damage.  Paged Dr. Daphine Deutscher for orders.

## 2012-02-23 ENCOUNTER — Telehealth (INDEPENDENT_AMBULATORY_CARE_PROVIDER_SITE_OTHER): Payer: Self-pay | Admitting: General Surgery

## 2012-02-23 ENCOUNTER — Ambulatory Visit
Admission: RE | Admit: 2012-02-23 | Discharge: 2012-02-23 | Disposition: A | Discharge status: Home / Self Care | Payer: No Typology Code available for payment source | Source: Ambulatory Visit | Attending: Surgery | Admitting: Surgery

## 2012-02-23 DIAGNOSIS — K859 Acute pancreatitis without necrosis or infection, unspecified: Secondary | ICD-10-CM

## 2012-02-23 MED ORDER — IOHEXOL 300 MG/ML  SOLN
100.0000 mL | Freq: Once | INTRAMUSCULAR | Status: AC | PRN
  Administered 2012-02-23: 100 mL via INTRAVENOUS

## 2012-02-23 NOTE — Telephone Encounter (Signed)
Pt called to report she has just returned from the CT scan.  The site of her drain is slightly reddened and is "seeping fluid."  She denies fever at this time.  Advised to reinforce the dressing as needed, monitor the drainage and the site.  Call back if develop fever > 101 F or symptoms of infection are present.

## 2012-02-23 NOTE — Telephone Encounter (Signed)
Message copied by Littie Deeds on Tue Feb 23, 2012  5:19 PM ------      Message from: Leanne Chang      Created: Tue Feb 23, 2012  4:20 PM      Regarding: Dr Daphine Deutscher      Contact: [phone]       Want CT Scan results. Will drainage tube be removed?

## 2012-02-23 NOTE — Telephone Encounter (Signed)
Called pt and explained that Dr. Daphine Deutscher will review her CT first thing when he gets back into the office and that I will call her tomorrow and let her know of the results and whether or not we can bring her in this week and remove her drain.

## 2012-02-24 ENCOUNTER — Telehealth (INDEPENDENT_AMBULATORY_CARE_PROVIDER_SITE_OTHER): Payer: Self-pay | Admitting: General Surgery

## 2012-02-24 NOTE — Telephone Encounter (Signed)
LMOM asking pt to return my call. °

## 2012-02-25 ENCOUNTER — Other Ambulatory Visit (INDEPENDENT_AMBULATORY_CARE_PROVIDER_SITE_OTHER): Payer: Self-pay | Admitting: Surgery

## 2012-02-25 ENCOUNTER — Ambulatory Visit (INDEPENDENT_AMBULATORY_CARE_PROVIDER_SITE_OTHER): Payer: Self-pay | Admitting: General Surgery

## 2012-02-25 ENCOUNTER — Telehealth (INDEPENDENT_AMBULATORY_CARE_PROVIDER_SITE_OTHER): Payer: Self-pay | Admitting: General Surgery

## 2012-02-25 ENCOUNTER — Encounter (INDEPENDENT_AMBULATORY_CARE_PROVIDER_SITE_OTHER): Payer: Self-pay | Admitting: General Surgery

## 2012-02-25 VITALS — BP 112/76 | HR 70 | Temp 97.6°F | Resp 14 | Ht 66.0 in | Wt 136.0 lb

## 2012-02-25 DIAGNOSIS — K651 Peritoneal abscess: Secondary | ICD-10-CM

## 2012-02-25 DIAGNOSIS — Z9889 Other specified postprocedural states: Secondary | ICD-10-CM

## 2012-02-25 DIAGNOSIS — K859 Acute pancreatitis without necrosis or infection, unspecified: Secondary | ICD-10-CM

## 2012-02-25 MED ORDER — OXYCODONE HCL 5 MG PO TABS: 5.0000 mg | ORAL_TABLET | ORAL | Status: DC | PRN

## 2012-02-25 NOTE — Patient Instructions (Signed)
Drink boost or protein shakes. Take a multivitamin daily.

## 2012-02-25 NOTE — Telephone Encounter (Signed)
Pt returned my call and I explained that Dr. Daphine Deutscher reviewed her CT scan and that she may have her Drain removed.  He does want IR to remove this drain so I informed her that I will get an appt set up for that.  She then informed me that she is having drainage, bleeding, and a foul odor come from her exp lap incision site.  I informed her that I would like her to come into urgent office to have this checked to make sure she doesn't have an infection.

## 2012-02-25 NOTE — Telephone Encounter (Signed)
Spoke with Inetta Fermo from IR and explained that Dr. Daphine Deutscher wanted them to remove this pt's drain.  She said they could work her in tomorrow 11/8 at 3:00.  I then  Called the pt and informed her of this.

## 2012-02-25 NOTE — Progress Notes (Signed)
Subjective:     Patient ID: [name], female   DOB: May 13, 1955, 56 y.o.   MRN: [id number]  HPI  She presents today because she is concerned about some drainage from her midline incision. She's not been having some fever but spent having some chills. She had a recent CT done and tomorrow she is going to have the percutaneous drain removed.   Review of Systems  She is not eating well.     Objective:   Physical Exam Gen.-thin female in no acute distress. She is afebrile.  Abdomen-midline incision with small superficial open area and a little bit of yellowish drainage but no evidence of infection. A saline damp to dry dressing was applied. Drain output on the right side is serous.    Assessment:     No evidence of wound infection and wound is healing well by secondary intention. She does not have much appetite. Still having some abdominal pain.    Plan:     I encouraged her to drink boost her protein shakes. We'll refill her OxyIR. I recommended she take a multivitamin daily. Return visit with Dr. Daphine Deutscher in 2 weeks.

## 2012-02-25 NOTE — Telephone Encounter (Signed)
Pt called to clarify care of incision until next OV with Dr. Daphine Deutscher on 03/11/12.  Explained wound healing from the inside out and her wound being open to drain.  She understands to remove the dressing, shower with soap and water, rinse well and pat dry.  She will call for any signs of infection.

## 2012-02-25 NOTE — Telephone Encounter (Signed)
LMOM letting pt know her next appt will be 11/22 at 4:40.

## 2012-02-26 ENCOUNTER — Ambulatory Visit (HOSPITAL_COMMUNITY)
Admission: RE | Admit: 2012-02-26 | Discharge: 2012-02-26 | Disposition: A | Discharge status: Home / Self Care | Payer: Self-pay | Source: Ambulatory Visit | Attending: Surgery | Admitting: Surgery

## 2012-02-26 DIAGNOSIS — K859 Acute pancreatitis without necrosis or infection, unspecified: Secondary | ICD-10-CM

## 2012-02-26 DIAGNOSIS — K651 Peritoneal abscess: Secondary | ICD-10-CM

## 2012-02-26 NOTE — Progress Notes (Signed)
Patient ID: [name], female   DOB: 11/22/1955, 56 y.o.   MRN: [id number] Per [facility] order, the pt's right abdominal peritoneal drain was removed in its entirety without immediate complications. A gauze dressing was applied to the site.

## 2012-03-02 ENCOUNTER — Telehealth (INDEPENDENT_AMBULATORY_CARE_PROVIDER_SITE_OTHER): Payer: Self-pay

## 2012-03-02 NOTE — Telephone Encounter (Signed)
She was just given 50 tablets 6 days ago.  I am not comfortable prescribing more.  I would prefer that Dr Abbey Chatters review this tomorrow, since he is more familiar with her case.

## 2012-03-02 NOTE — Telephone Encounter (Signed)
Pt notified of Dr Maurine Minister request and that rx request is being sent to Dr Abbey Chatters and Cyndra Numbers for review and recommendation. Pt states she understands and will await call back tomorrow.

## 2012-03-02 NOTE — Telephone Encounter (Signed)
Pt called stating she would like a refill on her oxycodone 5mg . Pt states she still has a lot of pain when she is up doing much and has 4 tablets left. Pt advised I will send request to Urg MD for review. Pt can be reached at [phone].

## 2012-03-04 ENCOUNTER — Telehealth (INDEPENDENT_AMBULATORY_CARE_PROVIDER_SITE_OTHER): Payer: Self-pay

## 2012-03-04 NOTE — Telephone Encounter (Signed)
Patient calling in to check the status of her pain medication refill request for Oxycodone. Patient called on 03/02/12 requesting a refill for Oxycodone, request was submitted & reviewed with Dr. Abbey Chatters on 03/02/12 and request was denied.  Patient received a prescription on 02/25/12 for Oxycodone #50.  Patient states she takes 4 a day, patient advised that her refill request will need to be reviewed by Dr. Daphine Deutscher.  Patient was unhappy with this decision and questioned why we would not refill her pain medication (Oxycodone) Patient was advised that her last refill was on 02/25/12  #50 and it has only been 8 day's and will need to be reviewed by Dr. Daphine Deutscher.  Patient is aware that Dr. Ermalene Searing not available this week and that i will forward the message to both Dr Daphine Deutscher and his nurse Penni Bombard. (patient s/p exploratory laparotomy 01/03/12 and peritoneal abscess and pancreatitis 02/26/12)

## 2012-03-04 NOTE — Telephone Encounter (Signed)
Return incoming call -  Patient calling into office requesting a refill on her Oxycodone.  Patient last refill was on 02/25/12 Oxycodone #50.  Prescription request was submitted for review to Dr. Abbey Chatters on 03/02/12 and denied.  Patient has been notified on 2 different occasions that her refill request has been denied and it will be submitted to Dr. Daphine Deutscher for further review.  Patient also advised she may need to go to the emergency room for further evaluation if she continues to have pain that is not controlled by narcotic pain relievers.

## 2012-03-07 ENCOUNTER — Other Ambulatory Visit (INDEPENDENT_AMBULATORY_CARE_PROVIDER_SITE_OTHER): Payer: Self-pay | Admitting: Surgery

## 2012-03-07 ENCOUNTER — Other Ambulatory Visit (INDEPENDENT_AMBULATORY_CARE_PROVIDER_SITE_OTHER): Payer: Self-pay | Admitting: General Surgery

## 2012-03-07 DIAGNOSIS — K859 Acute pancreatitis without necrosis or infection, unspecified: Secondary | ICD-10-CM

## 2012-03-07 MED ORDER — TRAMADOL HCL 50 MG PO TABS: 50.0000 mg | ORAL_TABLET | Freq: Four times a day (QID) | ORAL | Status: AC | PRN

## 2012-03-08 NOTE — Telephone Encounter (Signed)
Pt would like a refill on this med...please advise

## 2012-03-09 ENCOUNTER — Other Ambulatory Visit (INDEPENDENT_AMBULATORY_CARE_PROVIDER_SITE_OTHER): Payer: Self-pay | Admitting: General Surgery

## 2012-03-09 ENCOUNTER — Telehealth (INDEPENDENT_AMBULATORY_CARE_PROVIDER_SITE_OTHER): Payer: Self-pay | Admitting: General Surgery

## 2012-03-09 NOTE — Telephone Encounter (Signed)
Pt called for Phenergan refill.  Per Dr. Daphine Deutscher, will refill Phenergan 12.5 mg tabs, # 30 , 1 po Q6H prn nausea, no refill.  Spoke with pharmacist at [facility]:  [phone].  No more refills from Dr. Daphine Deutscher on this meds.  Pt instructed also: no more refill of this med; she is to call her PCP.

## 2012-03-09 NOTE — Telephone Encounter (Signed)
This Rx has already been filled 

## 2012-03-11 ENCOUNTER — Encounter (INDEPENDENT_AMBULATORY_CARE_PROVIDER_SITE_OTHER): Payer: Self-pay | Admitting: Surgery

## 2012-03-11 ENCOUNTER — Ambulatory Visit (INDEPENDENT_AMBULATORY_CARE_PROVIDER_SITE_OTHER): Payer: Self-pay | Admitting: Surgery

## 2012-03-11 VITALS — BP 126/72 | HR 68 | Temp 98.9°F | Resp 14 | Ht 66.0 in | Wt 133.6 lb

## 2012-03-11 DIAGNOSIS — K859 Acute pancreatitis without necrosis or infection, unspecified: Secondary | ICD-10-CM

## 2012-03-11 NOTE — Progress Notes (Signed)
[name] 56 y.o.  Body mass index is 21.56 kg/(m^2).  Patient Active Problem List  Diagnosis  . DEPRESSION  . ESOPHAGEAL MOTILITY DISORDER  . HIATAL HERNIA  . FATTY LIVER DISEASE  . CHEST PAIN, NON-CARDIAC  . DIARRHEA  . ALCOHOL ABUSE, HX OF  . Alcohol abuse  . Pancreatitis caused by a combination of alcoholism, obstruction of the common bile duct by blood clots and a small stone, and extrinsic compression of the common bile duct by pancreatic pseudocyst  . Elevated liver function tests  . GERD (gastroesophageal reflux disease)  . Delirium tremens  . UTI (lower urinary tract infection)  . Pseudocyst of pancreas  . Acute blood loss anemia  . Depression  . Fatty liver  . Acute pancreatitis  . Hyponatremia  . S/P laparotomy  . Gastric ulcer  . Physical deconditioning  . Anemia  . Protein-calorie malnutrition, moderate    Allergies  Allergen Reactions  . Ciprofloxacin Nausea Only    Past Surgical History  Procedure Date  . Cholecystectomy   . Shoulder surgery [id number]  . Diagnostic mammogram 2008  . Tubal ligation   . Colonoscopy Never  . Esophagogastroduodenoscopy 03/21/2011    Procedure: ESOPHAGOGASTRODUODENOSCOPY (EGD);  Surgeon: Yancey Flemings, MD;  Location: [facility] ENDOSCOPY;  Service: Endoscopy;  Laterality: N/A;  Patient may need to be done at bedside tomorrow depending on status  . Ercp 10/06/2011    Procedure: ENDOSCOPIC RETROGRADE CHOLANGIOPANCREATOGRAPHY (ERCP);  Surgeon: Theda Belfast, MD;  Location: [facility] ENDOSCOPY;  Service: Endoscopy;  Laterality: N/A;  . Laparotomy 01/03/2012    Procedure: EXPLORATORY LAPAROTOMY;  Surgeon: Valarie Merino, MD;  Location: [facility] ORS;  Service: General;  Laterality: N/A;  debridement of necrotic pancreas and placement of drains x2  . Esophagogastroduodenoscopy 01/06/2012    Procedure: ESOPHAGOGASTRODUODENOSCOPY (EGD);  Surgeon: Graylin Shiver, MD;  Location: [facility] ENDOSCOPY;  Service: Endoscopy;  Laterality: N/A;  bedside    MACKENZIE,BRIAN, MD No diagnosis found.  Doing much better.  Still having some right sided pain intermittently.  She is constipated-no greasy stools so I doubt that she has pancreatic insufficiency.  Incision is not quite healed in the umbilicus and I treated with silver nitrate Will see back in 2 months Matt B. Daphine Deutscher, MD, [facility] [phone] beeper [phone]  03/11/2012 5:48 PM

## 2012-03-18 ENCOUNTER — Other Ambulatory Visit (INDEPENDENT_AMBULATORY_CARE_PROVIDER_SITE_OTHER): Payer: Self-pay | Admitting: General Surgery

## 2012-05-13 ENCOUNTER — Encounter (INDEPENDENT_AMBULATORY_CARE_PROVIDER_SITE_OTHER): Payer: Self-pay | Admitting: Surgery

## 2012-06-04 ENCOUNTER — Other Ambulatory Visit: Payer: Self-pay

## 2013-02-23 ENCOUNTER — Other Ambulatory Visit: Payer: Self-pay

## 2013-04-20 HISTORY — PX: OTHER SURGICAL HISTORY: SHX169

## 2013-10-01 ENCOUNTER — Emergency Department (HOSPITAL_COMMUNITY)
Admission: EM | Admit: 2013-10-01 | Discharge: 2013-10-02 | Disposition: A | Discharge status: Other Acute Inpatient Hospital | Payer: Managed Care, Other (non HMO) | Attending: Emergency Medicine | Admitting: Emergency Medicine

## 2013-10-01 ENCOUNTER — Encounter (HOSPITAL_COMMUNITY): Payer: Self-pay | Admitting: Emergency Medicine

## 2013-10-01 ENCOUNTER — Emergency Department (HOSPITAL_COMMUNITY): Payer: Managed Care, Other (non HMO)

## 2013-10-01 DIAGNOSIS — Z789 Other specified health status: Secondary | ICD-10-CM | POA: Insufficient documentation

## 2013-10-01 DIAGNOSIS — Z8719 Personal history of other diseases of the digestive system: Secondary | ICD-10-CM | POA: Insufficient documentation

## 2013-10-01 DIAGNOSIS — R062 Wheezing: Secondary | ICD-10-CM | POA: Insufficient documentation

## 2013-10-01 DIAGNOSIS — F3289 Other specified depressive episodes: Secondary | ICD-10-CM | POA: Insufficient documentation

## 2013-10-01 DIAGNOSIS — F1020 Alcohol dependence, uncomplicated: Secondary | ICD-10-CM | POA: Insufficient documentation

## 2013-10-01 DIAGNOSIS — Z872 Personal history of diseases of the skin and subcutaneous tissue: Secondary | ICD-10-CM | POA: Insufficient documentation

## 2013-10-01 DIAGNOSIS — F329 Major depressive disorder, single episode, unspecified: Secondary | ICD-10-CM | POA: Insufficient documentation

## 2013-10-01 DIAGNOSIS — Z87891 Personal history of nicotine dependence: Secondary | ICD-10-CM | POA: Insufficient documentation

## 2013-10-01 DIAGNOSIS — F411 Generalized anxiety disorder: Secondary | ICD-10-CM | POA: Insufficient documentation

## 2013-10-01 DIAGNOSIS — F1210 Cannabis abuse, uncomplicated: Secondary | ICD-10-CM | POA: Insufficient documentation

## 2013-10-01 LAB — RAPID URINE DRUG SCREEN, HOSP PERFORMED
Amphetamines: NOT DETECTED
Barbiturates: NOT DETECTED
Benzodiazepines: NOT DETECTED
Cocaine: NOT DETECTED
Opiates: NOT DETECTED
Tetrahydrocannabinol: POSITIVE — AB

## 2013-10-01 LAB — CBC
HCT: 43.3 % (ref 36.0–46.0)
Hemoglobin: 14.6 g/dL (ref 12.0–15.0)
MCH: 32 pg (ref 26.0–34.0)
MCHC: 33.7 g/dL (ref 30.0–36.0)
MCV: 95 fL (ref 78.0–100.0)
PLATELETS: 321 10*3/uL (ref 150–400)
RBC: 4.56 MIL/uL (ref 3.87–5.11)
RDW: 12.7 % (ref 11.5–15.5)
WBC: 12 10*3/uL — ABNORMAL HIGH (ref 4.0–10.5)

## 2013-10-01 LAB — ACETAMINOPHEN LEVEL: Acetaminophen (Tylenol), Serum: <15 ug/mL (ref 10–30)

## 2013-10-01 LAB — COMPREHENSIVE METABOLIC PANEL
ALT: 14 U/L (ref 0–35)
AST: 18 U/L (ref 0–37)
Albumin: 3.6 g/dL (ref 3.5–5.2)
Alkaline Phosphatase: 96 U/L (ref 39–117)
BUN: 10 mg/dL (ref 6–23)
CALCIUM: 9 mg/dL (ref 8.4–10.5)
CO2: 20 meq/L (ref 19–32)
CREATININE: 0.67 mg/dL (ref 0.50–1.10)
Chloride: 99 mEq/L (ref 96–112)
GFR calc Af Amer: >90 mL/min (ref >90)
Glucose, Bld: 122 mg/dL — ABNORMAL HIGH (ref 70–99)
Potassium: 4 mEq/L (ref 3.7–5.3)
Sodium: 135 mEq/L — ABNORMAL LOW (ref 137–147)
Total Bilirubin: <0.2 mg/dL — ABNORMAL LOW (ref 0.3–1.2)
Total Protein: 7.7 g/dL (ref 6.0–8.3)

## 2013-10-01 LAB — SALICYLATE LEVEL

## 2013-10-01 LAB — ETHANOL: Alcohol, Ethyl (B): 201 mg/dL — ABNORMAL HIGH (ref 0–11)

## 2013-10-01 MED ORDER — ZOLPIDEM TARTRATE 5 MG PO TABS: 5.0000 mg | ORAL_TABLET | Freq: Every evening | ORAL | Status: DC | PRN

## 2013-10-01 MED ORDER — LORAZEPAM 1 MG PO TABS
0.0000 mg | ORAL_TABLET | Freq: Four times a day (QID) | ORAL | Status: DC
Start: 2013-10-02 — End: 2013-10-02

## 2013-10-01 MED ORDER — THIAMINE HCL 100 MG/ML IJ SOLN: 100.0000 mg | Freq: Every day | INTRAMUSCULAR | Status: DC

## 2013-10-01 MED ORDER — ONDANSETRON HCL 4 MG PO TABS: 4.0000 mg | ORAL_TABLET | Freq: Three times a day (TID) | ORAL | Status: DC | PRN

## 2013-10-01 MED ORDER — IBUPROFEN 200 MG PO TABS: 600.0000 mg | ORAL_TABLET | Freq: Three times a day (TID) | ORAL | Status: DC | PRN

## 2013-10-01 MED ORDER — NICOTINE 21 MG/24HR TD PT24
21.0000 mg | MEDICATED_PATCH | Freq: Every day | TRANSDERMAL | Status: DC
Start: 2013-10-02 — End: 2013-10-02

## 2013-10-01 MED ORDER — ALUM & MAG HYDROXIDE-SIMETH 200-200-20 MG/5ML PO SUSP
30.0000 mL | ORAL | Status: DC | PRN
Start: 2013-10-01 — End: 2013-10-02

## 2013-10-01 MED ORDER — VITAMIN B-1 100 MG PO TABS: 100.0000 mg | ORAL_TABLET | Freq: Every day | ORAL | Status: DC

## 2013-10-01 MED ORDER — ACETAMINOPHEN 325 MG PO TABS: 650.0000 mg | ORAL_TABLET | ORAL | Status: DC | PRN

## 2013-10-01 MED ORDER — LORAZEPAM 1 MG PO TABS
0.0000 mg | ORAL_TABLET | Freq: Two times a day (BID) | ORAL | Status: DC
Start: 2013-10-01 — End: 2013-10-02
  Administered 2013-10-01: 1 mg via ORAL
  Filled 2013-10-01: qty 1

## 2013-10-01 NOTE — ED Provider Notes (Signed)
CSN: [id number]     Arrival date & time 10/01/13  2124 History  This chart was scribed for non-physician practitioner, Antonietta Breach, PA-C working with Kalman Drape, MD by Frederich Balding, ED scribe. This patient was seen in room WTR4/WLPT4 and the patient's care was started at 11:23 PM.   Chief Complaint  Patient presents with  . Alcohol Intoxication   The history is provided by the patient. No language interpreter was used.   HPI Comments: [name] is a 58 y.o. female who presents to the Emergency Department requesting detox from alcohol. States her last drink was about two hours ago. She drinks a few bottles of wine or a six pack of beer per day. Pt states she has been drinking heavily for pretty much her whole life and has gone thru rehab several times. States she started drinking again 4 days ago. Pt states there is not a certain stressor that starts her drinking. States she has smoked marijuana but denies other illicit drug use. Denies SI/HI. Pt states her cigarette use has increased over the last week. States she has noticed mild wheezing when she lays down. Denies other physical complaints. Denies history of seizures with withdrawal.   Past Medical History  Diagnosis Date  . GERD (gastroesophageal reflux disease)   . Rosacea   . Elevated liver function tests   . Wears glasses   . Chronic diarrhea   . Alcohol abuse     H/o withdrawal   . PONV (postoperative nausea and vomiting)   . Pancreatitis    Past Surgical History  Procedure Laterality Date  . Cholecystectomy    . Shoulder surgery  [id number]  . Diagnostic mammogram  2008  . Tubal ligation    . Colonoscopy  Never  . Esophagogastroduodenoscopy  03/21/2011    Procedure: ESOPHAGOGASTRODUODENOSCOPY (EGD);  Surgeon: Scarlette Shorts, MD;  Location: [facility] ENDOSCOPY;  Service: Endoscopy;  Laterality: N/A;  Patient may need to be done at bedside tomorrow depending on status  . Ercp  10/06/2011    Procedure: ENDOSCOPIC RETROGRADE  CHOLANGIOPANCREATOGRAPHY (ERCP);  Surgeon: Beryle Beams, MD;  Location: [facility] ENDOSCOPY;  Service: Endoscopy;  Laterality: N/A;  . Laparotomy  01/03/2012    Procedure: EXPLORATORY LAPAROTOMY;  Surgeon: Pedro Earls, MD;  Location: [facility] ORS;  Service: General;  Laterality: N/A;  debridement of necrotic pancreas and placement of drains x2  . Esophagogastroduodenoscopy  01/06/2012    Procedure: ESOPHAGOGASTRODUODENOSCOPY (EGD);  Surgeon: Wonda Horner, MD;  Location: [facility] ENDOSCOPY;  Service: Endoscopy;  Laterality: N/A;  bedside   Family History  Problem Relation Age of Onset  . Stroke Mother   . Heart disease Father   . Heart failure Father   . Heart disease Sister    History  Substance Use Topics  . Smoking status: Former Smoker -- 5 years    Quit date: 09/22/1978  . Smokeless tobacco: Never Used  . Alcohol Use: 1.5 - 2.0 oz/week    3-4 drink(s) per week     Comment: gets dt's - can make it up to 1 week at times   OB History   Grav Para Term Preterm Abortions TAB SAB Ect Mult Living                  Review of Systems  Respiratory: Positive for wheezing.   Psychiatric/Behavioral: Negative for suicidal ideas.  All other systems reviewed and are negative.   Allergies  Ciprofloxacin  Home Medications   Prior to  Admission medications   Medication Sig Start Date End Date Taking? Authorizing Provider  ibuprofen (ADVIL,MOTRIN) 200 MG tablet Take 400 mg by mouth every 6 (six) hours as needed (for pain.).   Yes Historical Provider, MD   BP 118/72  Pulse 85  Temp(Src) 98.1 F (36.7 C) (Oral)  Resp 18  Ht 5\' 6"  (1.676 m)  Wt 150 lb (68.04 kg)  BMI 24.22 kg/m2  SpO2 98%  LMP 12/08/2006  Physical Exam  Nursing note and vitals reviewed. Constitutional: She is oriented to person, place, and time. She appears well-developed and well-nourished. No distress.  HENT:  Head: Normocephalic and atraumatic.  Eyes: Conjunctivae and EOM are normal. No scleral icterus.  Neck: Normal  range of motion.  Cardiovascular: Normal rate, regular rhythm and normal heart sounds.   Pulmonary/Chest: Effort normal and breath sounds normal. No respiratory distress. She has no wheezes. She has no rales.  Abdominal: Soft. She exhibits no distension. There is no tenderness. There is no rebound and no guarding.  Soft, nontender. No masses.  Musculoskeletal: Normal range of motion.  Neurological: She is alert and oriented to person, place, and time.  GCS 15. Speech is goal oriented. Patient moves extremities without ataxia. She ambulates with normal gait.  Skin: Skin is warm and dry. No rash noted. She is not diaphoretic. No erythema. No pallor.  Psychiatric: Her speech is normal. Her mood appears anxious. She exhibits a depressed mood. She expresses no homicidal and no suicidal ideation. She expresses no suicidal plans and no homicidal plans.  Patient tearful.    ED Course  Procedures (including critical care time)  DIAGNOSTIC STUDIES: Oxygen Saturation is 96% on RA, normal by my interpretation.    COORDINATION OF CARE: 11:27 PM-Discussed treatment plan which includes chest xray and speaking with an ACT team member with pt at bedside and pt agreed to plan.   Labs Review Labs Reviewed  CBC - Abnormal; Notable for the following:    WBC 12.0 (*)    All other components within normal limits  COMPREHENSIVE METABOLIC PANEL - Abnormal; Notable for the following:    Sodium 135 (*)    Glucose, Bld 122 (*)    Total Bilirubin <0.2 (*)    All other components within normal limits  ETHANOL - Abnormal; Notable for the following:    Alcohol, Ethyl (B) 201 (*)    All other components within normal limits  SALICYLATE LEVEL - Abnormal; Notable for the following:    Salicylate Lvl <0.6 (*)    All other components within normal limits  URINE RAPID DRUG SCREEN (HOSP PERFORMED) - Abnormal; Notable for the following:    Tetrahydrocannabinol POSITIVE (*)    All other components within normal  limits  ACETAMINOPHEN LEVEL    Imaging Review Dg Chest 2 View  10/02/2013   CLINICAL DATA:  Shortness of breath, cough and wheezing. History of smoking.  EXAM: CHEST  2 VIEW  COMPARISON:  Chest radiograph performed 01/22/2012  FINDINGS: The lungs are well-aerated and clear. There is no evidence of focal opacification, pleural effusion or pneumothorax.  The heart is normal in size; the mediastinal contour is within normal limits. No acute osseous abnormalities are seen. Clips are noted within the right upper quadrant, reflecting prior cholecystectomy.  A small to moderate hiatal hernia is noted.  IMPRESSION: 1. No acute cardiopulmonary process seen. 2. Small to moderate hiatal hernia noted.   Electronically Signed   By: Garald Balding M.D.   On: 10/02/2013 00:19  EKG Interpretation None      MDM   Final diagnoses:  Alcohol dependence    Patient presents for alcohol detox and rehab placement. Ethanol 201. Patient medically cleared. Accepted at [facility] for inpatient tx. Accepting physician Dr. Sabra Heck.  I personally performed the services described in this documentation, which was scribed in my presence. The recorded information has been reviewed and is accurate.   Filed Vitals:   10/01/13 2135 10/01/13 2331 10/01/13 2342  BP: 130/87  118/72  Pulse: 88 85 85  Temp: 98.1 F (36.7 C)    TempSrc: Oral    Resp: 19  18  Height: 5\' 6"  (1.676 m)    Weight: 150 lb (68.04 kg)    SpO2: 96%  98%     Antonietta Breach, PA-C 10/02/13 0022

## 2013-10-01 NOTE — ED Notes (Signed)
Pt arrived to the ED with a complaint of detox.  Pt had her last drink about an hour ago.

## 2013-10-01 NOTE — BH Assessment (Signed)
Spoke with Antonietta Breach, PA-C who stated that patient is requesting detox from alcohol. Pt reported that if she doesn't get help she will die. Assessment will be initiated.

## 2013-10-02 ENCOUNTER — Encounter (HOSPITAL_COMMUNITY): Payer: Self-pay | Admitting: *Deleted

## 2013-10-02 ENCOUNTER — Inpatient Hospital Stay (HOSPITAL_COMMUNITY)
Admission: AD | Admit: 2013-10-02 | Discharge: 2013-10-10 | DRG: 897 | Disposition: A | Discharge status: Home / Self Care | Payer: 59 | Source: Intra-hospital | Attending: Psychiatry | Admitting: Psychiatry

## 2013-10-02 DIAGNOSIS — F10230 Alcohol dependence with withdrawal, uncomplicated: Secondary | ICD-10-CM

## 2013-10-02 DIAGNOSIS — F339 Major depressive disorder, recurrent, unspecified: Secondary | ICD-10-CM | POA: Diagnosis present

## 2013-10-02 DIAGNOSIS — F410 Panic disorder [episodic paroxysmal anxiety] without agoraphobia: Secondary | ICD-10-CM | POA: Diagnosis present

## 2013-10-02 DIAGNOSIS — F411 Generalized anxiety disorder: Secondary | ICD-10-CM | POA: Diagnosis present

## 2013-10-02 DIAGNOSIS — Z8249 Family history of ischemic heart disease and other diseases of the circulatory system: Secondary | ICD-10-CM

## 2013-10-02 DIAGNOSIS — F10939 Alcohol use, unspecified with withdrawal, unspecified: Secondary | ICD-10-CM | POA: Diagnosis present

## 2013-10-02 DIAGNOSIS — F10239 Alcohol dependence with withdrawal, unspecified: Principal | ICD-10-CM | POA: Diagnosis present

## 2013-10-02 DIAGNOSIS — Z823 Family history of stroke: Secondary | ICD-10-CM

## 2013-10-02 DIAGNOSIS — K219 Gastro-esophageal reflux disease without esophagitis: Secondary | ICD-10-CM | POA: Diagnosis present

## 2013-10-02 DIAGNOSIS — F10930 Alcohol use, unspecified with withdrawal, uncomplicated: Secondary | ICD-10-CM

## 2013-10-02 DIAGNOSIS — F1320 Sedative, hypnotic or anxiolytic dependence, uncomplicated: Secondary | ICD-10-CM | POA: Diagnosis present

## 2013-10-02 DIAGNOSIS — F10232 Alcohol dependence with withdrawal with perceptual disturbance: Secondary | ICD-10-CM

## 2013-10-02 DIAGNOSIS — F1020 Alcohol dependence, uncomplicated: Secondary | ICD-10-CM | POA: Diagnosis present

## 2013-10-02 DIAGNOSIS — F10932 Alcohol use, unspecified with withdrawal with perceptual disturbance: Secondary | ICD-10-CM

## 2013-10-02 DIAGNOSIS — G4700 Insomnia, unspecified: Secondary | ICD-10-CM | POA: Diagnosis present

## 2013-10-02 DIAGNOSIS — F1994 Other psychoactive substance use, unspecified with psychoactive substance-induced mood disorder: Secondary | ICD-10-CM | POA: Diagnosis present

## 2013-10-02 DIAGNOSIS — F332 Major depressive disorder, recurrent severe without psychotic features: Secondary | ICD-10-CM

## 2013-10-02 HISTORY — DX: Alcohol dependence with withdrawal, unspecified: F10.239

## 2013-10-02 HISTORY — DX: Alcohol use, unspecified with withdrawal, unspecified: F10.939

## 2013-10-02 LAB — AMYLASE: AMYLASE: 23 U/L (ref 0–105)

## 2013-10-02 LAB — LIPASE, BLOOD: Lipase: 24 U/L (ref 11–59)

## 2013-10-02 MED ORDER — ONDANSETRON 4 MG PO TBDP: 4.0000 mg | ORAL_TABLET | Freq: Four times a day (QID) | ORAL | Status: DC | PRN

## 2013-10-02 MED ORDER — HYDROXYZINE HCL 25 MG PO TABS
25.0000 mg | ORAL_TABLET | Freq: Four times a day (QID) | ORAL | Status: DC | PRN
  Administered 2013-10-02 – 2013-10-09 (×9): 25 mg via ORAL
  Filled 2013-10-02 (×10): qty 1

## 2013-10-02 MED ORDER — NICOTINE 21 MG/24HR TD PT24
21.0000 mg | MEDICATED_PATCH | Freq: Every day | TRANSDERMAL | Status: DC
  Administered 2013-10-02 – 2013-10-10 (×10): 21 mg via TRANSDERMAL
  Filled 2013-10-02 (×12): qty 1

## 2013-10-02 MED ORDER — CHLORDIAZEPOXIDE HCL 25 MG PO CAPS: 25.0000 mg | ORAL_CAPSULE | ORAL | Status: DC

## 2013-10-02 MED ORDER — ACETAMINOPHEN 325 MG PO TABS: 650.0000 mg | ORAL_TABLET | Freq: Four times a day (QID) | ORAL | Status: DC | PRN

## 2013-10-02 MED ORDER — LOPERAMIDE HCL 2 MG PO CAPS: 2.0000 mg | ORAL_CAPSULE | ORAL | Status: DC | PRN

## 2013-10-02 MED ORDER — ONDANSETRON 4 MG PO TBDP
4.0000 mg | ORAL_TABLET | Freq: Four times a day (QID) | ORAL | Status: AC | PRN
  Administered 2013-10-02 – 2013-10-06 (×5): 4 mg via ORAL
  Filled 2013-10-02 (×5): qty 1

## 2013-10-02 MED ORDER — CHLORDIAZEPOXIDE HCL 25 MG PO CAPS: 25.0000 mg | ORAL_CAPSULE | Freq: Every day | ORAL | Status: DC

## 2013-10-02 MED ORDER — CHLORDIAZEPOXIDE HCL 25 MG PO CAPS
ORAL_CAPSULE | ORAL | Status: AC
  Filled 2013-10-02: qty 2

## 2013-10-02 MED ORDER — TRAZODONE HCL 50 MG PO TABS
50.0000 mg | ORAL_TABLET | Freq: Every evening | ORAL | Status: DC | PRN
  Administered 2013-10-02 – 2013-10-09 (×9): 50 mg via ORAL
  Filled 2013-10-02 (×5): qty 1
  Filled 2013-10-02: qty 6
  Filled 2013-10-02 (×4): qty 1

## 2013-10-02 MED ORDER — ALUM & MAG HYDROXIDE-SIMETH 200-200-20 MG/5ML PO SUSP: 30.0000 mL | ORAL | Status: DC | PRN

## 2013-10-02 MED ORDER — ACETAMINOPHEN 325 MG PO TABS: 650.0000 mg | ORAL_TABLET | ORAL | Status: DC | PRN

## 2013-10-02 MED ORDER — CHLORDIAZEPOXIDE HCL 25 MG PO CAPS
50.0000 mg | ORAL_CAPSULE | Freq: Once | ORAL | Status: AC
  Administered 2013-10-02: 50 mg via ORAL
  Filled 2013-10-02: qty 2

## 2013-10-02 MED ORDER — MAGNESIUM HYDROXIDE 400 MG/5ML PO SUSP
30.0000 mL | Freq: Every day | ORAL | Status: DC | PRN
  Administered 2013-10-08 (×2): 30 mL via ORAL

## 2013-10-02 MED ORDER — LOPERAMIDE HCL 2 MG PO CAPS: 2.0000 mg | ORAL_CAPSULE | ORAL | Status: AC | PRN

## 2013-10-02 MED ORDER — CHLORDIAZEPOXIDE HCL 25 MG PO CAPS: 50.0000 mg | ORAL_CAPSULE | Freq: Once | ORAL | Status: DC

## 2013-10-02 MED ORDER — CHLORDIAZEPOXIDE HCL 25 MG PO CAPS
25.0000 mg | ORAL_CAPSULE | Freq: Three times a day (TID) | ORAL | Status: DC
  Administered 2013-10-03 (×2): 25 mg via ORAL
  Filled 2013-10-02 (×3): qty 1

## 2013-10-02 MED ORDER — THIAMINE HCL 100 MG/ML IJ SOLN: 100.0000 mg | Freq: Every day | INTRAMUSCULAR | Status: DC

## 2013-10-02 MED ORDER — CHLORDIAZEPOXIDE HCL 25 MG PO CAPS: 25.0000 mg | ORAL_CAPSULE | Freq: Three times a day (TID) | ORAL | Status: DC

## 2013-10-02 MED ORDER — CHLORDIAZEPOXIDE HCL 25 MG PO CAPS: 25.0000 mg | ORAL_CAPSULE | Freq: Four times a day (QID) | ORAL | Status: DC | PRN

## 2013-10-02 MED ORDER — IBUPROFEN 600 MG PO TABS
600.0000 mg | ORAL_TABLET | Freq: Three times a day (TID) | ORAL | Status: DC | PRN
  Administered 2013-10-03 – 2013-10-07 (×4): 600 mg via ORAL
  Filled 2013-10-02 (×4): qty 1

## 2013-10-02 MED ORDER — ADULT MULTIVITAMIN W/MINERALS CH: 1.0000 | ORAL_TABLET | Freq: Every day | ORAL | Status: DC

## 2013-10-02 MED ORDER — VITAMIN B-1 100 MG PO TABS
100.0000 mg | ORAL_TABLET | Freq: Every day | ORAL | Status: DC
  Administered 2013-10-02 – 2013-10-10 (×9): 100 mg via ORAL
  Filled 2013-10-02 (×10): qty 1

## 2013-10-02 MED ORDER — ALUM & MAG HYDROXIDE-SIMETH 200-200-20 MG/5ML PO SUSP
30.0000 mL | ORAL | Status: DC | PRN
  Administered 2013-10-02 – 2013-10-06 (×2): 30 mL via ORAL

## 2013-10-02 MED ORDER — CHLORDIAZEPOXIDE HCL 25 MG PO CAPS
25.0000 mg | ORAL_CAPSULE | Freq: Four times a day (QID) | ORAL | Status: DC | PRN
Start: 2013-10-02 — End: 2013-10-03
  Administered 2013-10-02 (×2): 25 mg via ORAL
  Filled 2013-10-02 (×2): qty 1

## 2013-10-02 MED ORDER — CHLORDIAZEPOXIDE HCL 25 MG PO CAPS: 25.0000 mg | ORAL_CAPSULE | Freq: Four times a day (QID) | ORAL | Status: DC

## 2013-10-02 MED ORDER — HYDROXYZINE HCL 25 MG PO TABS: 25.0000 mg | ORAL_TABLET | Freq: Four times a day (QID) | ORAL | Status: DC | PRN

## 2013-10-02 MED ORDER — ADULT MULTIVITAMIN W/MINERALS CH
1.0000 | ORAL_TABLET | Freq: Every day | ORAL | Status: DC
  Administered 2013-10-02 – 2013-10-10 (×9): 1 via ORAL
  Filled 2013-10-02 (×10): qty 1

## 2013-10-02 MED ORDER — CHLORDIAZEPOXIDE HCL 25 MG PO CAPS
25.0000 mg | ORAL_CAPSULE | Freq: Four times a day (QID) | ORAL | Status: AC
  Administered 2013-10-02 – 2013-10-03 (×4): 25 mg via ORAL
  Filled 2013-10-02 (×4): qty 1

## 2013-10-02 NOTE — BHH Group Notes (Signed)
[facility] LCSW Aftercare Discharge Planning Group Note   10/02/2013  8:45 AM  Participation Quality:  Did Not Attend - pt sleeping in her room  Regan Lemming, LCSW 10/02/2013 9:56 AM

## 2013-10-02 NOTE — Progress Notes (Signed)
D) Pt. C/o general malaise, withdrawal and anxiety.  Appearance is disheveled, hygiene poor. Pt. Spoke about being clean and sober for 15 years and then having a relapse after her pancreas surgery in 2013.  Pt. Verbalizes no other stressors at that time.    A) Pt. Offered emotional support. PRN and scheduled meds given as ordered and per request. Comfort measures encouraged.  R) Pt. Receptive and cooperative.  Able to verbalize no thoughts of SI, and states "I came here to live, not to die".  Pt. Continues on q 15 min observations and is safe at this time.

## 2013-10-02 NOTE — Progress Notes (Signed)
Patient ID: [name], female   DOB: 01-Feb-1956, 58 y.o.   MRN: [id number] Pt did not attend group.

## 2013-10-02 NOTE — H&P (Signed)
Psychiatric Admission Assessment Adult  Patient Identification:  [name] Date of Evaluation:  10/02/2013 Chief Complaint:  ETOH USE DISORDER,SEV History of Present Illness:: 58 Y/O female who admits she has been drinking too much as much "as she can" beer and wine (12 pack , 2 ( 1.5 L of wine easily). Drinking every day for almost year and a half. She was abstinent for couple of months after she had surgery for her pancreatitis. States she was placed on Opioids and Benzodiazepines. When they did not refill them anymore it triggered the relapse. Was "straight for 15 years" before that  Past history of cocaine, and "all kind" of drugs. States she has lost good jobs because of her alcohol use. Now she has a job she loves and does not want to lose it. She has been noticing she is not as sharp as she used to be since she has been drinking this much. States the other reason why to quit is what she is doing to her body Associated Signs/Synptoms: Depression Symptoms:  depressed mood, anhedonia, insomnia, fatigue, anxiety, insomnia, loss of energy/fatigue, disturbed sleep, decreased appetite, (Hypo) Manic Symptoms:  Labiality of Mood, Anxiety Symptoms:  Excessive Worry, Has has panic attacks Psychotic Symptoms:  Denies PTSD Symptoms: Negative Total Time spent with patient: 45 minutes  Psychiatric Specialty Exam: Physical Exam  Review of Systems  Constitutional: Positive for malaise/fatigue and diaphoresis.  Eyes: Positive for blurred vision.  Respiratory: Positive for cough.        Smokes a pack a day sometimes more  Cardiovascular: Positive for chest pain and palpitations.  Gastrointestinal: Positive for abdominal pain and diarrhea.  Genitourinary: Negative.   Musculoskeletal: Positive for joint pain.       Knees pain  Skin: Negative.   Neurological: Positive for tremors, weakness and headaches.  Endo/Heme/Allergies: Negative.   Psychiatric/Behavioral: Positive for depression  and substance abuse. The patient is nervous/anxious and has insomnia.     Blood pressure 148/87, pulse 96, temperature 98 F (36.7 C), temperature source Oral, resp. rate 18, height $RemoveBe'5\' 5"'kngkeVEdf$  (1.651 m), weight 73.936 kg (163 lb), last menstrual period 12/08/2006.Body mass index is 27.12 kg/(m^2).  General Appearance: Disheveled  Eye Sport and exercise psychologist::  Fair  Speech:  Clear and Coherent and Slow  Volume:  Decreased  Mood:  Anxious and Depressed  Affect:  Restricted  Thought Process:  Coherent and Goal Directed  Orientation:  Full (Time, Place, and Person)  Thought Content:  worries, symtpoms, concerns  Suicidal Thoughts:  No  Homicidal Thoughts:  No  Memory:  Immediate;   Fair Recent;   Fair Remote;   Fair  Judgement:  Fair  Insight:  Present  Psychomotor Activity:  Restlessness  Concentration:  Fair  Recall:  [employer] of Knowledge:NA  Language: Fair  Akathisia:  No  Handed:    AIMS (if indicated):     Assets:  Desire for Improvement Housing Talents/Skills Vocational/Educational  Sleep:  Number of Hours: 2.5    Musculoskeletal: Strength & Muscle Tone: within normal limits Gait & Station: normal Patient leans: N/A  Past Psychiatric History: Diagnosis:  Hospitalizations: [facility], In [place] (does not remembers)  Outpatient Care: Denies  Substance Abuse Care: [facility]( not the 4 weeks) did their CD IOP able to abstain the 15 years  Self-Mutilation: Yes   Suicidal Attempts: Yes many years ago under the influence  Violent Behaviors:  Denies   Past Medical History:   Past Medical History  Diagnosis Date  . GERD (gastroesophageal  reflux disease)   . Rosacea   . Elevated liver function tests   . Wears glasses   . Chronic diarrhea   . Alcohol abuse     H/o withdrawal   . PONV (postoperative nausea and vomiting)   . Pancreatitis     Allergies:   Allergies  Allergen Reactions  . Ciprofloxacin Nausea Only   PTA Medications: Prescriptions prior to admission  Medication Sig  Dispense Refill  . ibuprofen (ADVIL,MOTRIN) 200 MG tablet Take 400 mg by mouth every 6 (six) hours as needed (for pain.).        Previous Psychotropic Medications:  Medication/Dose    Took an antidepressant for a month              Substance Abuse History in the last 12 months:  yes  Consequences of Substance Abuse: Blackouts:   Withdrawal Symptoms:   Diaphoresis Diarrhea Headaches Nausea Tremors Vomiting  Social History:  reports that she quit smoking about 35 years ago. She has never used smokeless tobacco. She reports that she drinks about 1.5 - 2 ounces of alcohol per week. She reports that she does not use illicit drugs. Additional Social History:                      Current Place of Residence:   Place of Birth:   Family Members: Marital Status:  Divorced Children:  Sons: 44 Y/0  Daughters: Relationships: Education:  BS in Archivist Problems/Performance: Religious Beliefs/Practices: History of Abuse (Emotional/Phsycial/Sexual) Occupational Experiences; Control and instrumentation engineer History:  None. Legal History: Hobbies/Interests:  Family History:   Family History  Problem Relation Age of Onset  . Stroke Mother   . Heart disease Father   . Heart failure Father   . Heart disease Sister    Father alcoholism Mother depression Results for orders placed during the hospital encounter of 10/01/13 (from the past 76 hour(s))  ACETAMINOPHEN LEVEL     Status: None   Collection Time    10/01/13 10:08 PM      Result Value Ref Range   Acetaminophen (Tylenol), Serum <15.0  10 - 30 ug/mL   Comment:            THERAPEUTIC CONCENTRATIONS VARY     SIGNIFICANTLY. A RANGE OF 10-30     ug/mL MAY BE AN EFFECTIVE     CONCENTRATION FOR MANY PATIENTS.     HOWEVER, SOME ARE BEST TREATED     AT CONCENTRATIONS OUTSIDE THIS     RANGE.     ACETAMINOPHEN CONCENTRATIONS     >150 ug/mL AT 4 HOURS AFTER     INGESTION AND >50 ug/mL AT 12     HOURS  AFTER INGESTION ARE     OFTEN ASSOCIATED WITH TOXIC     REACTIONS.  CBC     Status: Abnormal   Collection Time    10/01/13 10:08 PM      Result Value Ref Range   WBC 12.0 (*) 4.0 - 10.5 K/uL   RBC 4.56  3.87 - 5.11 MIL/uL   Hemoglobin 14.6  12.0 - 15.0 g/dL   HCT 43.3  36.0 - 46.0 %   MCV 95.0  78.0 - 100.0 fL   MCH 32.0  26.0 - 34.0 pg   MCHC 33.7  30.0 - 36.0 g/dL   RDW 12.7  11.5 - 15.5 %   Platelets 321  150 - 400 K/uL  COMPREHENSIVE METABOLIC PANEL     Status: Abnormal  Collection Time    10/01/13 10:08 PM      Result Value Ref Range   Sodium 135 (*) 137 - 147 mEq/L   Potassium 4.0  3.7 - 5.3 mEq/L   Chloride 99  96 - 112 mEq/L   CO2 20  19 - 32 mEq/L   Glucose, Bld 122 (*) 70 - 99 mg/dL   BUN 10  6 - 23 mg/dL   Creatinine, Ser 6.70  0.50 - 1.10 mg/dL   Calcium 9.0  8.4 - 51.8 mg/dL   Total Protein 7.7  6.0 - 8.3 g/dL   Albumin 3.6  3.5 - 5.2 g/dL   AST 18  0 - 37 U/L   ALT 14  0 - 35 U/L   Alkaline Phosphatase 96  39 - 117 U/L   Total Bilirubin <0.2 (*) 0.3 - 1.2 mg/dL   GFR calc non Af Amer >90  >90 mL/min   GFR calc Af Amer >90  >90 mL/min   Comment: (NOTE)     The eGFR has been calculated using the CKD EPI equation.     This calculation has not been validated in all clinical situations.     eGFR's persistently <90 mL/min signify possible Chronic Kidney     Disease.  ETHANOL     Status: Abnormal   Collection Time    10/01/13 10:08 PM      Result Value Ref Range   Alcohol, Ethyl (B) 201 (*) 0 - 11 mg/dL   Comment:            LOWEST DETECTABLE LIMIT FOR     SERUM ALCOHOL IS 11 mg/dL     FOR MEDICAL PURPOSES ONLY  SALICYLATE LEVEL     Status: Abnormal   Collection Time    10/01/13 10:08 PM      Result Value Ref Range   Salicylate Lvl <2.0 (*) 2.8 - 20.0 mg/dL  URINE RAPID DRUG SCREEN (HOSP PERFORMED)     Status: Abnormal   Collection Time    10/01/13 10:21 PM      Result Value Ref Range   Opiates NONE DETECTED  NONE DETECTED   Cocaine NONE DETECTED   NONE DETECTED   Benzodiazepines NONE DETECTED  NONE DETECTED   Amphetamines NONE DETECTED  NONE DETECTED   Tetrahydrocannabinol POSITIVE (*) NONE DETECTED   Barbiturates NONE DETECTED  NONE DETECTED   Comment:            DRUG SCREEN FOR MEDICAL PURPOSES     ONLY.  IF CONFIRMATION IS NEEDED     FOR ANY PURPOSE, NOTIFY LAB     WITHIN 5 DAYS.                LOWEST DETECTABLE LIMITS     FOR URINE DRUG SCREEN     Drug Class       Cutoff (ng/mL)     Amphetamine      1000     Barbiturate      200     Benzodiazepine   200     Tricyclics       300     Opiates          300     Cocaine          300     THC              50   Psychological Evaluations:  Assessment:   DSM5:  Schizophrenia Disorders:  none Obsessive-Compulsive  Disorders:  none Trauma-Stressor Disorders:  none Substance/Addictive Disorders:  Alcohol Related Disorder - Severe (303.90) Depressive Disorders:  Major Depressive Disorder - Moderate (296.22)  AXIS I:  Generalized Anxiety Disorder and Substance Induced Mood Disorder,Panic attacks AXIS II:  Deferred AXIS III:   Past Medical History  Diagnosis Date  . GERD (gastroesophageal reflux disease)   . Rosacea   . Elevated liver function tests   . Wears glasses   . Chronic diarrhea   . Alcohol abuse     H/o withdrawal   . PONV (postoperative nausea and vomiting)   . Pancreatitis    AXIS IV:  other psychosocial or environmental problems AXIS V:  41-50 serious symptoms  Treatment Plan/Recommendations:  Supportive approach/coping skills/relapse prevention                                                                 Safe detox with Librium                                                                  Reassess and address the co morbidities                                                                  Facilitate admission to a residential treatment program  Treatment Plan Summary: Daily contact with patient to assess and evaluate symptoms and progress in  treatment Medication management Current Medications:  Current Facility-Administered Medications  Medication Dose Route Frequency Provider Last Rate Last Dose  . acetaminophen (TYLENOL) tablet 650 mg  650 mg Oral Q4H PRN Lurena Nida, NP      . acetaminophen (TYLENOL) tablet 650 mg  650 mg Oral Q6H PRN Lurena Nida, NP      . alum & mag hydroxide-simeth (MAALOX/MYLANTA) 200-200-20 MG/5ML suspension 30 mL  30 mL Oral PRN Lurena Nida, NP      . alum & mag hydroxide-simeth (MAALOX/MYLANTA) 200-200-20 MG/5ML suspension 30 mL  30 mL Oral Q4H PRN Lurena Nida, NP      . chlordiazePOXIDE (LIBRIUM) capsule 25 mg  25 mg Oral Q6H PRN Lurena Nida, NP   25 mg at 10/02/13 0926  . chlordiazePOXIDE (LIBRIUM) capsule 25 mg  25 mg Oral QID Lurena Nida, NP       Followed by  . [START ON 10/03/2013] chlordiazePOXIDE (LIBRIUM) capsule 25 mg  25 mg Oral TID Lurena Nida, NP       Followed by  . [START ON 10/04/2013] chlordiazePOXIDE (LIBRIUM) capsule 25 mg  25 mg Oral BH-qamhs Lurena Nida, NP       Followed by  . [START ON 10/06/2013] chlordiazePOXIDE (LIBRIUM) capsule 25 mg  25 mg Oral Daily Lurena Nida, NP      . ibuprofen (ADVIL,MOTRIN) tablet 600 mg  600 mg Oral Q8H PRN Lurena Nida, NP      . loperamide (IMODIUM) capsule 2-4 mg  2-4 mg Oral PRN Lurena Nida, NP      . magnesium hydroxide (MILK OF MAGNESIA) suspension 30 mL  30 mL Oral Daily PRN Lurena Nida, NP      . multivitamin with minerals tablet 1 tablet  1 tablet Oral Daily Lurena Nida, NP   1 tablet at 10/02/13 0915  . nicotine (NICODERM CQ - dosed in mg/24 hours) patch 21 mg  21 mg Transdermal Daily Lurena Nida, NP   21 mg at 10/02/13 0918  . ondansetron (ZOFRAN-ODT) disintegrating tablet 4 mg  4 mg Oral Q6H PRN Lurena Nida, NP   4 mg at 10/02/13 6773  . thiamine (B-1) injection 100 mg  100 mg Intramuscular Daily Lurena Nida, NP      . thiamine (VITAMIN B-1) tablet 100 mg  100 mg Oral Daily Lurena Nida, NP   100 mg at 10/02/13  0915    Observation Level/Precautions:  15 minute checks  Laboratory:  As per the ED  Psychotherapy:  Individual/group  Medications:  Librium detox/reassess for antidepressants  Consultations:    Discharge Concerns:    Estimated LOS: 7-3GKKD  Other:     I certify that inpatient services furnished can reasonably be expected to improve the patient's condition.   Strummer Canipe A 6/15/201511:17 AM

## 2013-10-02 NOTE — Progress Notes (Signed)
Pt came to the nurse's station earlier c/o anxiety and tearfulness.  "[name] have to give me something else for anxiety;  I just can't stop crying."  After checking pt's order's, found that she only had Librium.  Spoke to PA who ordered Vistaril prn for anxiety.  When order became available, pt was also able to get her sleep aid and scheduled Librium.  Pt denies SI/HI/AV.  "I'm here to get off alcohol, not because I want to hurt myself."  Pt medicated as ordered.  Pt makes her needs known to staff.  Pt voices no other needs or concerns.  Support and encouragement offered.  Safety maintained with q15 minute checks.

## 2013-10-02 NOTE — Progress Notes (Signed)
Patient ID: [name], female   DOB: 1955-12-26, 58 y.o.   MRN: [id number] This is a voluntary admission of a  58 y.o. D/W/F with a Dx of ETOH Abuse.The patient stated her last drink was about two hours prior to admission this evening. She reports that she drinks a couple of bottles of wine per day. This is not her first hospitalization for detox. She states she has been drinking heavily for pretty much her whole adult life and has gone through rehab several times. Her longest period of sobriety was 15 years and ended in 2007. Denies any other substance abuse other than occasional THC use. Denies SI/HI. Hx of Pancreatitis. The patient lives alone and is employed as a Engineer, maintenance (IT). She has begun missing work due to her alcohol use and she is afraid she might lose her job.  Fall risk plan reviewed with the patient.

## 2013-10-02 NOTE — Progress Notes (Signed)
Adult Psychoeducational Group Note  Date:  10/02/2013 Time:  10:12 PM  Group Topic/Focus:  Wrap-Up Group:   The focus of this group is to help patients review their daily goal of treatment and discuss progress on daily workbooks.  Participation Level:  Did Not Attend  Additional Comments:  Pt did not attend wrap up group   Cadience Bradfield 10/02/2013, 10:12 PM

## 2013-10-02 NOTE — BH Assessment (Signed)
Assessment completed. Consulted with Serena Colonel, NP who recommended inpatient treatment. Patient has been accepted to [facility]. Antonietta Breach, PA-C has been notified of the recommendation and acceptance to [facility].

## 2013-10-02 NOTE — [facility] Suicide Risk Assessment (Signed)
Suicide Risk Assessment  Admission Assessment     Nursing information obtained from:  Patient Demographic factors:  Divorced or widowed;Caucasian;Living alone Current Mental Status:  NA Loss Factors:  NA Historical Factors:  Prior suicide attempts;Family history of mental illness or substance abuse Risk Reduction Factors:  Employed Total Time spent with patient: 45 minutes  CLINICAL FACTORS:   Depression:   Comorbid alcohol abuse/dependence Alcohol/Substance Abuse/Dependencies COGNITIVE FEATURES THAT CONTRIBUTE TO RISK:  Closed-mindedness Polarized thinking Thought constriction (tunnel vision)    SUICIDE RISK:   Moderate:  Frequent suicidal ideation with limited intensity, and duration, some specificity in terms of plans, no associated intent, good self-control, limited dysphoria/symptomatology, some risk factors present, and identifiable protective factors, including available and accessible social support.  PLAN OF CARE: Supportive approach/coping skills/relapse prevention                               Librium detox/reassess and address the co morbities  I certify that inpatient services furnished can reasonably be expected to improve the patient's condition.  Chaka Jefferys A 10/02/2013, 7:07 PM

## 2013-10-02 NOTE — Progress Notes (Signed)
Patient ID: [name], female   DOB: [birth year], 58 y.o.   MRN: [id number]  Activity:Listened to music and reflected on a quiet place where they find wellness. Drew a picture of this place and described it to the group.  Patient wrote the words [employer]." I took Wellness literally and thought of a place where I work on getting healthy."

## 2013-10-02 NOTE — [facility] Assessment (Signed)
Assessment Note  [name] is an 58 y.o. female presenting to [facility] ED requesting alcohol detox. Pt stated "I am an alcoholic and I don't want to be'. Pt also stated "if I don't stop drinking I will die".  Pt denies SI, HI, AH and VH at this time. Pt reported that she has attempted suicide many years ago and has had multiple hospitalizations in the past. Pt also reported that she has completed several detox programs in the past and her longest period of sobriety was 15 years. Patient is unsure of what triggered her relapse. Pt did not report a history of seizures. Pt reported that she has been binge drinking for the past 4 days and has consumed 6 bottles of wine and 4-6pk of beer during that time. Pt reported that today she had a couple of glasses of wine and 5 beers. Pt shared that she has been missing work due to her drinking. Pt lives alone and works full time. Pt reported that she has a best friend that she can count on for support.   Axis I: Alcohol Use Disorder, Moderate  Axis II: No diagnosis Axis III:  Past Medical History  Diagnosis Date  . GERD (gastroesophageal reflux disease)   . Rosacea   . Elevated liver function tests   . Wears glasses   . Chronic diarrhea   . Alcohol abuse     H/o withdrawal   . PONV (postoperative nausea and vomiting)   . Pancreatitis    Axis IV: occupational problems Axis V: 41-50 serious symptoms  Past Medical History:  Past Medical History  Diagnosis Date  . GERD (gastroesophageal reflux disease)   . Rosacea   . Elevated liver function tests   . Wears glasses   . Chronic diarrhea   . Alcohol abuse     H/o withdrawal   . PONV (postoperative nausea and vomiting)   . Pancreatitis     Past Surgical History  Procedure Laterality Date  . Cholecystectomy    . Shoulder surgery  [id number]  . Diagnostic mammogram  2008  . Tubal ligation    . Colonoscopy  Never  . Esophagogastroduodenoscopy  03/21/2011    Procedure: ESOPHAGOGASTRODUODENOSCOPY  (EGD);  Surgeon: Scarlette Shorts, MD;  Location: [facility] ENDOSCOPY;  Service: Endoscopy;  Laterality: N/A;  Patient may need to be done at bedside tomorrow depending on status  . Ercp  10/06/2011    Procedure: ENDOSCOPIC RETROGRADE CHOLANGIOPANCREATOGRAPHY (ERCP);  Surgeon: Beryle Beams, MD;  Location: [facility] ENDOSCOPY;  Service: Endoscopy;  Laterality: N/A;  . Laparotomy  01/03/2012    Procedure: EXPLORATORY LAPAROTOMY;  Surgeon: Pedro Earls, MD;  Location: [facility] ORS;  Service: General;  Laterality: N/A;  debridement of necrotic pancreas and placement of drains x2  . Esophagogastroduodenoscopy  01/06/2012    Procedure: ESOPHAGOGASTRODUODENOSCOPY (EGD);  Surgeon: Wonda Horner, MD;  Location: [facility] ENDOSCOPY;  Service: Endoscopy;  Laterality: N/A;  bedside    Family History:  Family History  Problem Relation Age of Onset  . Stroke Mother   . Heart disease Father   . Heart failure Father   . Heart disease Sister     Social History:  reports that she quit smoking about 35 years ago. She has never used smokeless tobacco. She reports that she drinks about 1.5 - 2 ounces of alcohol per week. She reports that she does not use illicit drugs.  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 Name of Substance 1: Alcohol  1 - Age of First Use: 18 1 - Amount (size/oz): "a bottle of wine" or a "6pk" 1 - Frequency: daily  1 - Duration: ongoing  1 - Last Use / Amount: 10-01-13 "6pk and a couple glasses of wine"  Substance #2 Name of Substance 2: Marijuana  2 - Age of First Use: 85 2 - Amount (size/oz): "tiny bowl" 2 - Frequency: weekly  2 - Duration: 6 months  2 - Last Use / Amount: 10-01-13 "tiny bowl"  CIWA: CIWA-Ar BP: 118/72 mmHg Pulse Rate: 85 Nausea and Vomiting: mild nausea with no vomiting Tactile Disturbances: moderate itching, pins and needles, burning  or numbness Tremor: not visible, but can be felt fingertip to fingertip Auditory Disturbances: not present Paroxysmal Sweats: barely perceptible sweating, palms moist Visual Disturbances: mild sensitivity Anxiety: two Headache, Fullness in Head: none present Agitation: somewhat more than normal activity Orientation and Clouding of Sensorium: oriented and can do serial additions CIWA-Ar Total: 11 COWS:    Allergies:  Allergies  Allergen Reactions  . Ciprofloxacin Nausea Only    Home Medications:  (Not in a hospital admission)  OB/GYN Status:  Patient's last menstrual period was 12/08/2006.  General Assessment Data Location of Assessment: [facility] ED Is this a Tele or Face-to-Face Assessment?: Face-to-Face Is this an Initial Assessment or a Re-assessment for this encounter?: Initial Assessment Living Arrangements: Alone Can pt return to current living arrangement?: Yes Admission Status: Voluntary Is patient capable of signing voluntary admission?: Yes Transfer from: [facility] Referral Source: Self/Family/Friend     [place] Living Arrangements: Alone Name of Psychiatrist: NA Name of Therapist: NA  Education Status Highest grade of school patient has completed: 12 (Bachelor's Degree)  Risk to self Suicidal Ideation: No Suicidal Intent: No Is patient at risk for suicide?: No Suicidal Plan?: No Access to Means: No What has been your use of drugs/alcohol within the last 12 months?: daily Previous Attempts/Gestures: Yes How many times?: 3 Other Self Harm Risks: none identified at this time Triggers for Past Attempts: Unpredictable Intentional Self Injurious Behavior: None Family Suicide History: No Persecutory voices/beliefs?: No Depression: Yes Depression Symptoms: Fatigue;Despondent;Loss of interest in usual pleasures Substance abuse history and/or treatment for substance abuse?: Yes Suicide prevention information given to non-admitted patients: Not  applicable  Risk to Others Homicidal Ideation: No Thoughts of Harm to Others: No Current Homicidal Intent: No Current Homicidal Plan: No Access to Homicidal Means: No Identified Victim: NA History of harm to others?: No Assessment of Violence: None Noted Violent Behavior Description: No violent behavior reported  Does patient have access to weapons?: No Criminal Charges Pending?: No Does patient have a court date: No  Psychosis Hallucinations: None noted Delusions: None noted  Mental Status Report Appear/Hygiene: Other (Comment) (Appropriate) Eye Contact: Good Motor Activity: Freedom of movement Speech: Logical/coherent Level of Consciousness: Alert Mood: Pleasant Affect: Appropriate to circumstance Anxiety Level: None Thought Processes: Coherent;Relevant Judgement: Unimpaired Orientation: Person;Place;Time;Situation Obsessive Compulsive Thoughts/Behaviors: None  Cognitive Functioning Concentration: Normal Memory: Recent Intact IQ: Average Insight: Good Impulse Control: Fair Appetite: Fair Weight Loss: 0 Weight Gain: 0 Sleep: No Change Total Hours of Sleep: 6 Vegetative Symptoms: Staying in bed  ADLScreening [facility] Assessment Services) Patient's cognitive ability adequate to safely complete daily activities?: Yes Patient able to express need for assistance with ADLs?: Yes Independently performs ADLs?: Yes (appropriate for developmental age)  Prior Inpatient Therapy Prior Inpatient Therapy: Yes Prior Therapy Dates: 2012 Prior Therapy Facilty/Provider(s): [facility] Reason for Treatment: detox   Prior Outpatient Therapy Prior Outpatient Therapy: Yes Prior Therapy Dates: 2012 Prior Therapy Facilty/Provider(s): [facility] Reason for Treatment: depression  ADL Screening (condition at time of admission) Patient's cognitive ability adequate to safely complete daily activities?: Yes Is the patient deaf or have difficulty hearing?: No Does the patient have difficulty seeing,  even when wearing glasses/contacts?: No Does the patient have difficulty concentrating, remembering, or making decisions?: No Patient able to express need for assistance with ADLs?: Yes Does the patient have difficulty dressing or bathing?: No Independently performs ADLs?: Yes (appropriate for developmental age) Does the patient have difficulty walking or climbing stairs?: No       Abuse/Neglect Assessment (Assessment to be complete while patient is alone) Physical Abuse: Denies Verbal Abuse: Denies Sexual Abuse: Denies Exploitation of patient/patient's resources: Denies Self-Neglect: Denies Values / Beliefs Cultural Requests During Hospitalization: None Spiritual Requests During Hospitalization: None        Additional Information 1:1 In Past 12 Months?: No CIRT Risk: No Elopement Risk: No Does patient have medical clearance?: Yes     Disposition: Consulted with Serena Colonel, NP who recommended inpatient treatment. Patient has been accepted to [facility] Bed 1. Antonietta Breach, PA-C has been notified of the recommendation and acceptance to [facility].   Disposition Initial Assessment Completed for this Encounter: Yes Disposition of Patient: Inpatient treatment program Type of inpatient treatment program: Adult [facility] Room 306 Bed 1. )  On Site Evaluation by:   Reviewed with Physician:    Kandis Ban 10/02/2013 12:36 AM

## 2013-10-02 NOTE — [facility] Group Notes (Signed)
[place] LCSW Group Therapy  10/02/2013 1:15 PM   Type of Therapy:  Group Therapy  Participation Level:  Did Not Attend - pt sleeping in her room  [name], [place] 10/02/2013 2:59 PM

## 2013-10-02 NOTE — ED Provider Notes (Signed)
Medical screening examination/treatment/procedure(s) were performed by non-physician practitioner and as supervising physician I was immediately available for consultation/collaboration.   EKG Interpretation None       Kalman Drape, MD 10/02/13 [id number]

## 2013-10-03 DIAGNOSIS — F411 Generalized anxiety disorder: Secondary | ICD-10-CM

## 2013-10-03 DIAGNOSIS — F1994 Other psychoactive substance use, unspecified with psychoactive substance-induced mood disorder: Secondary | ICD-10-CM

## 2013-10-03 DIAGNOSIS — F1020 Alcohol dependence, uncomplicated: Secondary | ICD-10-CM

## 2013-10-03 MED ORDER — LORAZEPAM 1 MG PO TABS
ORAL_TABLET | ORAL | Status: AC
  Filled 2013-10-03: qty 1

## 2013-10-03 MED ORDER — LORAZEPAM 1 MG PO TABS
1.0000 mg | ORAL_TABLET | ORAL | Status: DC | PRN
  Administered 2013-10-03 – 2013-10-08 (×15): 1 mg via ORAL
  Filled 2013-10-03: qty 1
  Filled 2013-10-03: qty 2
  Filled 2013-10-03 (×2): qty 1
  Filled 2013-10-03: qty 2
  Filled 2013-10-03 (×5): qty 1
  Filled 2013-10-03: qty 2
  Filled 2013-10-03 (×4): qty 1

## 2013-10-03 MED ORDER — LORAZEPAM 1 MG PO TABS
ORAL_TABLET | ORAL | Status: AC
  Administered 2013-10-03: 1 mg via ORAL
  Filled 2013-10-03: qty 1

## 2013-10-03 NOTE — Progress Notes (Signed)
[facility], [place] MD Progress Note  10/03/2013 5:51 PM [name]  MRN:  [id number] Subjective:  Not feeling too well. States that she is feeling very anxious, agitated, shaky. Does not feel that the Librium is helping as much. 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  HENT: Negative.   Eyes: Negative.   Cardiovascular: Negative.   Gastrointestinal: Positive for nausea.  Genitourinary: Negative.   Musculoskeletal: Negative.   Skin: Negative.   Neurological: Positive for tremors and weakness.  Endo/Heme/Allergies: Negative.   Psychiatric/Behavioral: Positive for depression and substance abuse. The patient is nervous/anxious.     Blood pressure 131/82, pulse 81, temperature 97.7 F (36.5 C), temperature source Oral, resp. rate 18, height 5\' 5"  (1.651 m), weight 73.936 kg (163 lb), last menstrual period 12/08/2006, SpO2 97.00%.Body mass index is 27.12 kg/(m^2).  General Appearance: Fairly Groomed  Engineer, water::  Fair  Speech:  Clear and Coherent  Volume:  Decreased  Mood:  Anxious and Dysphoric  Affect:  anxious, worried  Thought Process:  Coherent and Goal Directed  Orientation:  Full (Time, Place, and Person)  Thought Content:  worries, concerns, symptoms  Suicidal Thoughts:  No  Homicidal  Thoughts:  No  Memory:  Immediate;   Fair Recent;   Fair Remote;   Fair  Judgement:  Fair  Insight:  Present  Psychomotor Activity:  Restlessness  Concentration:  Fair  Recall:  [employer] of Knowledge:NA  Language: Fair  Akathisia:  No  Handed:    AIMS (if indicated):     Assets:  Desire for Improvement Housing Talents/Skills  Sleep:  Number of Hours: 6.5   Musculoskeletal: Strength & Muscle Tone: within normal limits Gait & Station: normal Patient leans: N/A  Current Medications: Current Facility-Administered Medications  Medication Dose Route Frequency Jovanna Hodges Last Rate Last Dose  . acetaminophen (TYLENOL) tablet 650 mg  650 mg Oral Q4H PRN Lurena Nida, NP      . alum & mag hydroxide-simeth (MAALOX/MYLANTA) 200-200-20 MG/5ML suspension 30 mL  30 mL Oral Q4H PRN Lurena Nida, NP   30 mL at 10/02/13 1553  . chlordiazePOXIDE (LIBRIUM) capsule 25 mg  25 mg Oral TID Lurena Nida, NP   25 mg at 10/03/13 1642   Followed by  . [START ON 10/04/2013] chlordiazePOXIDE (LIBRIUM) capsule 25 mg  25 mg Oral BH-qamhs Lurena Nida, NP       Followed by  . [START ON 10/06/2013] chlordiazePOXIDE (LIBRIUM) capsule 25 mg  25 mg Oral Daily Lurena Nida, NP      . hydrOXYzine (ATARAX/VISTARIL) tablet 25 mg  25 mg Oral Q6H PRN Laverle Hobby, PA-C   25 mg at 10/03/13 1526  . ibuprofen (ADVIL,MOTRIN) tablet 600 mg  600 mg Oral Q8H PRN Lurena Nida, NP   600 mg at 10/03/13 1414  . loperamide (IMODIUM) capsule 2-4 mg  2-4 mg Oral PRN Lurena Nida, NP      . LORazepam (ATIVAN) 1 MG tablet           . LORazepam (ATIVAN) tablet 1 mg  1 mg Oral Q4H PRN Nicholaus Bloom, MD   1 mg at 10/03/13 1039  . magnesium hydroxide (MILK OF MAGNESIA) suspension 30 mL  30 mL Oral Daily PRN Lurena Nida, NP      . multivitamin with minerals tablet 1 tablet  1 tablet Oral Daily Lurena Nida, NP   1 tablet at 10/03/13 0800  . nicotine (NICODERM CQ - dosed in mg/24 hours) patch 21 mg  21 mg Transdermal Daily Lurena Nida, NP   21 mg at 10/03/13 0857  . ondansetron (ZOFRAN-ODT) disintegrating tablet 4 mg  4 mg Oral Q6H PRN Lurena Nida, NP   4 mg at 10/03/13 0915  . thiamine (B-1) injection 100 mg  100 mg Intramuscular Daily Lurena Nida, NP      . thiamine (VITAMIN B-1) tablet 100 mg  100 mg Oral Daily Lurena Nida, NP   100 mg at 10/03/13 0800  . traZODone (DESYREL) tablet 50 mg  50 mg Oral QHS PRN Nicholaus Bloom, MD   50 mg at 10/02/13 2103    Lab Results:  Results for orders placed during the hospital encounter of 10/02/13 (from the past 48 hour(s))  LIPASE, BLOOD     Status: None   Collection Time    10/02/13  7:35 PM      Result Value Ref Range   Lipase 24  11 - 59 U/L   Comment: Performed at [facility]     Status: None   Collection Time    10/02/13  7:35 PM      Result Value Ref Range   Amylase 23  0 - 105 U/L   Comment: Performed at [facility]    Physical Findings: AIMS: Facial and Oral Movements Muscles of Facial Expression: None, normal Lips and Perioral Area: None, normal Jaw: None, normal Tongue: None, normal,Extremity Movements Upper (arms, wrists, hands, fingers): None, normal Lower (legs, knees, ankles, toes): None, normal, Trunk Movements Neck, shoulders, hips: None, normal, Overall Severity Severity of abnormal movements (highest score from questions above): None, normal Incapacitation due to abnormal movements: None, normal Patient's awareness of abnormal movements (rate only patient's report): No Awareness, Dental Status Current problems with teeth and/or dentures?: No Does patient usually wear dentures?: No  CIWA:  CIWA-Ar Total: 2 COWS:     Treatment Plan Summary: Daily contact with patient to assess and evaluate symptoms and progress in treatment Medication management  Plan: Supportive approach/coping skills/relapse prevention           Trial with Ativan for detox             Lipase, Amylase WNL            Reassess and address the co morbities  Medical Decision Making Problem Points:  Established problem, worsening (2) and Review of psycho-social stressors (1) Data Points:  Review of medication regiment & side effects (2) Review of new medications or change in dosage (2)  I certify that inpatient services furnished can reasonably be expected to improve the patient's condition.   LUGO,IRVING A 10/03/2013, 5:51 PM

## 2013-10-03 NOTE — Progress Notes (Signed)
D:Pt c/o "heartache" that she describes as anxiety. Pt reports that she is having mild nausea with diarrhea last night and none so far this morning. Pt is pleasant and cooperative interacting on the unit.  A:Gave prn medication for nausea and anxiety. Offered support, encouragement and 15 minute checks.  R:Pt denies si and hi. Safety maintained on the unit.

## 2013-10-03 NOTE — Progress Notes (Signed)
Adult Psychoeducational Group Note  Date:  10/03/2013 Time:  9:42 PM  Group Topic/Focus:  Wrap-Up Group:   The focus of this group is to help patients review their daily goal of treatment and discuss progress on daily workbooks.  Participation Level:  Active  Participation Quality:  Appropriate  Affect:  Appropriate  Cognitive:  Appropriate  Insight: Appropriate  Engagement in Group:  Engaged  Modes of Intervention:  Discussion  Additional Comments:  Pt was present for wrap up group. She shared that she is glad that she is here. She stated that her goal for today is to stay sober, and she was proud that she was able to do that. She said her goal is the same for tomorrow. She had a good day because she had shelter and food today. She interacted well with her peers. She appeared bright.  Harrie Foreman A 10/03/2013, 9:42 PM

## 2013-10-03 NOTE — Progress Notes (Signed)
Adult Psychoeducational Group Note  Date:  10/03/2013 Time:  9:42 PM  Group Topic/Focus:  Wrap-Up Group:   The focus of this group is to help patients review their daily goal of treatment and discuss progress on daily workbooks.  Participation Level:  Active  Participation Quality:  Appropriate  Affect:  Appropriate  Cognitive:  Appropriate  Insight: Appropriate  Engagement in Group:  Engaged  Modes of Intervention:  Discussion  Additional Comments:  Pt was present for wrap up group. She shared that she learned today how glad she is to still be alive. She reported that she had the opportunity to reflect on the positives in her life. She shared that she had a goal today to open a line of communication with her mother, and she said she was able to accomplish this. She appeared bright this evening and interacted well with the other patients on the unit.   Harrie Foreman A 10/03/2013, 9:42 PM

## 2013-10-03 NOTE — Progress Notes (Signed)
Adult Psychoeducational Group Note  Date:  10/03/2013 Time:  1000 am  Group Topic/Focus: Orientation Group  Participation Level:  Did Not Attend  [name] 10/03/2013, 12:31 PM

## 2013-10-03 NOTE — [facility] Suicide Risk Assessment (Signed)
[place] INPATIENT: Family/Significant Other Suicide Prevention Education   Suicide Prevention Education:  Education Completed; No one has been identified by the patient as the family member/significant other with whom the patient will be residing, and identified as the person(s) who will aid the patient in the event of a mental health crisis (suicidal ideations/suicide attempt).   Pt did not c/o SI at admission, nor have they endorsed SI during their stay here. SPE not required. SPI pamphlet provided to pt and he was encouraged to share information with his support network, ask questions, and talk about any concerns.   The suicide prevention education provided includes the following:  Suicide risk factors  Suicide prevention and interventions  National Suicide Hotline telephone number  [facility] assessment telephone number  [facility] Emergency Assistance [place] and/or Residential Mobile Crisis Unit telephone number  Regan Lemming, [place] 10/03/2013  10:08 AM

## 2013-10-03 NOTE — Progress Notes (Signed)
Adult Psychoeducational Group Note  Date: 10/03/2013  Time: 11:00am  Group Topic: Mental Health Jeopardy   Participation Level: Active   Participation Quality: Appropriate, Attentive and Supportive   Affect: Appropriate   Engagement in Group: Engaged   Activity: Patients participated in jeopardy like game, answering questions relating to mental health awareness in categories such as Symptoms, Medications, Causes, Coping Skills, and Substance Abuse.   Additional Comments: Pt appropriately participated in group and gave positive feedback and encouragement to other group members.   Clint Bolder  10/03/2013, 1:48 PM

## 2013-10-03 NOTE — Progress Notes (Signed)
Pt reports she is doing better this evening, but is still having moderate withdrawal symptoms.  Pt was switched to Ativan for her detox because of elevated liver enzymes.  Pt also felt the Librium was not helping her.  Pt denies SI/HI/AV at this time.  Pt pleasant/cooperative with staff.  Pt intends to return home at discharge where she lives alone.  Pt was encouraged to get involved with some hobbies or activities to keep herself busy.  Pt requested prn Ativan for anxiety at this time which was given.  Pt makes her needs known to staff.  Support and encouragement offered.  Safety maintained with q15 minute checks.

## 2013-10-03 NOTE — [facility] Group Notes (Signed)
[place] LCSW Group Therapy  10/03/2013   1:15 PM   Type of Therapy:  Group Therapy  Participation Level:  Active  Participation Quality:  Attentive, Sharing and Supportive  Affect:  Depressed and Flat  Cognitive:  Alert and Oriented  Insight:  Developing/Improving and Engaged  Engagement in Therapy:  Developing/Improving and Engaged  Modes of Intervention:  Activity, Clarification, Confrontation, Discussion, Education, Exploration, Limit-setting, Orientation, Problem-solving, Rapport Building, Art therapist, Socialization and Support  Summary of Progress/Problems: Patient was attentive and engaged with speaker from [place].  Patient was attentive to speaker while they shared their story of dealing with mental health and overcoming it.  Patient expressed interest in their programs and services and received information on their agency.  Patient processed ways they can relate to the speaker.     Regan Lemming, LCSW 10/03/2013  2:21 PM
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
## 2013-10-03 NOTE — Tx Team (Signed)
Interdisciplinary Treatment Plan Update (Adult)  Date: 10/03/2013  Time Reviewed:  9:45 AM  Progress in Treatment: Attending groups: Yes Participating in groups:  Yes Taking medication as prescribed:  Yes Tolerating medication:  Yes Family/Significant othe contact made: CSW assessing Patient understands diagnosis:  Yes Discussing patient identified problems/goals with staff:  Yes Medical problems stabilized or resolved:  Yes Denies suicidal/homicidal ideation: Yes Issues/concerns per patient self-inventory:  Yes Other:  New problem(s) identified: N/A  Discharge Plan or Barriers: CSW assessing for appropriate referrals.    Reason for Continuation of Hospitalization: Anxiety Depression Medication Stabilization  Comments: N/A  Estimated length of stay: 3-5 days  For review of initial/current patient goals, please see plan of care.  Attendees: Patient:     Family:     Physician:  Dr. Sabra Heck 10/03/2013 9:42 AM   Nursing:   Mayra Neer, RN 10/03/2013 9:42 AM   Clinical Social Worker:  Regan Lemming, LCSW 10/03/2013 9:42 AM   Other: Lars Pinks, RN case manager 10/03/2013 9:42 AM   Other:  Maxie Better, LCSWA 10/03/2013 9:42 AM   Other:  Agustina Caroli, NP 10/03/2013 9:42 AM   Other:  Desma Paganini, RN 10/03/2013 9:42 AM   Other: Satira Sark, RN 10/03/2013 9:43 AM   Other:    Other:    Other:    Other:    Other:     Scribe for Treatment Team:   Ane Payment, 10/03/2013 , 9:42 AM

## 2013-10-04 MED ORDER — LORAZEPAM 1 MG PO TABS
1.0000 mg | ORAL_TABLET | Freq: Four times a day (QID) | ORAL | Status: AC
  Administered 2013-10-05 (×3): 1 mg via ORAL
  Filled 2013-10-04 (×3): qty 1

## 2013-10-04 MED ORDER — LORAZEPAM 1 MG PO TABS
1.0000 mg | ORAL_TABLET | Freq: Four times a day (QID) | ORAL | Status: AC
  Administered 2013-10-04 (×3): 1 mg via ORAL
  Filled 2013-10-04 (×3): qty 1

## 2013-10-04 MED ORDER — CHLORDIAZEPOXIDE HCL 25 MG PO CAPS: 25.0000 mg | ORAL_CAPSULE | ORAL | Status: DC

## 2013-10-04 MED ORDER — LORAZEPAM 1 MG PO TABS
1.0000 mg | ORAL_TABLET | Freq: Two times a day (BID) | ORAL | Status: AC
  Administered 2013-10-06 (×2): 1 mg via ORAL
  Filled 2013-10-04 (×2): qty 1

## 2013-10-04 MED ORDER — GABAPENTIN 300 MG PO CAPS
300.0000 mg | ORAL_CAPSULE | Freq: Three times a day (TID) | ORAL | Status: DC
  Administered 2013-10-04 – 2013-10-10 (×17): 300 mg via ORAL
  Filled 2013-10-04 (×22): qty 1

## 2013-10-04 MED ORDER — LORAZEPAM 1 MG PO TABS
1.0000 mg | ORAL_TABLET | Freq: Once | ORAL | Status: AC
  Administered 2013-10-04: 1 mg via ORAL
  Filled 2013-10-04: qty 1

## 2013-10-04 MED ORDER — CHLORDIAZEPOXIDE HCL 25 MG PO CAPS: 25.0000 mg | ORAL_CAPSULE | Freq: Every day | ORAL | Status: DC

## 2013-10-04 MED ORDER — ACAMPROSATE CALCIUM 333 MG PO TBEC
666.0000 mg | DELAYED_RELEASE_TABLET | Freq: Three times a day (TID) | ORAL | Status: DC
  Administered 2013-10-04 – 2013-10-10 (×17): 666 mg via ORAL
  Filled 2013-10-04 (×25): qty 2

## 2013-10-04 MED ORDER — LORAZEPAM 1 MG PO TABS
1.0000 mg | ORAL_TABLET | Freq: Every day | ORAL | Status: AC
  Administered 2013-10-07: 1 mg via ORAL

## 2013-10-04 NOTE — Progress Notes (Signed)
Pt has been up to groups and interacting with peers and staff.  She denied depression on her self-inventory and wrote,"not depressed only about not drinking" her hopelessness a 10 and she wrote,"hopeless I will never have another drink" and her anxiety a 10.  She denies any S/H ideation or A/V/H.  She is thinking about going to in-patent treatment from here possibly [place].  She also wrote on her self-inventory that," I'm an alcoholic and there is no cure"  The question on her self-inventory that asks about staying on medications after discharge she wrote," because I know I will start drinking again"  Dr. Sabra Heck has ordered campral to start today.

## 2013-10-04 NOTE — BHH Group Notes (Signed)
[facility] An LCSW Aftercare Discharge Planning Group Note   10/04/2013 10:29 AM  Participation Quality:  Active and Attentive   Mood/Affect:  Blunted and Depressed  Depression Rating:  2-3  Anxiety Rating:  9  Thoughts of Suicide:  No Will you contract for safety?   Yes  Current AVH:  No  Plan for Discharge/Comments:  Patient reports she is still deciding upon whether she would like to pursue inpatient or outpatient services upon discharge. Options provided to patient for [facility] or inpatient options. CSW will reassess this afternoon. Patient request a work note upon discharge.  Transportation Means: Dependent upon aftercare plan  Supports: Patient lives alone in [place]. Unidentified at this time.   PICKETT JR, GREGORY C

## 2013-10-04 NOTE — Progress Notes (Signed)
Adult Psychoeducational Group Note  Date:  10/04/2013 Time:  4:59 PM  Group Topic/Focus:  Personal Choices and Values:   The focus of this group is to help patients assess and explore the importance of values in their lives, how their values affect their decisions, how they express their values and what opposes their expression.  Participation Level:  Did Not Attend   Gunnar Bulla 10/04/2013, 4:59 PM

## 2013-10-04 NOTE — Progress Notes (Signed)
Provided spiritual and emotional support with pt in response to spiritual care consult.   [name] spoke extensively with chaplain about pattern of alcohol use, job loss, relationship loss.   Spoke with chaplain about who she "is" outside of alcohol use.  Chaplain noticed tendency for [name] to express some sense of hope or identity, and then revert to stating "but I'm just an alcoholic."  Spoke with chaplain about tension in holding hope or identifying herself with something else when the pattern of her life has been loss due to ETOH.   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  Support around grief related to potentially ceasing to use ETOH.  Encouraged rest and efforts to connect to who patient is in life outside of ETOH.    Will continue to follow for support as needed.  This chaplain out of hospital over next two days.  Please page [phone] as needs arise.   [place], [place]

## 2013-10-04 NOTE — BHH Group Notes (Signed)
Pt attended NA group tonight.

## 2013-10-04 NOTE — Progress Notes (Signed)
[facility] MD Progress Note  10/04/2013 4:26 PM NEEVA TREW  MRN:  [id number] Subjective:  Having a very hard time. States that she does not know if she is going to be able to stop drinking. States that she is an alcoholic and does not think she can stop. She later comes back to the office and states "I want a drink, I want a drink right now" States she does not have anyone. Does not feel she has support. 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  HENT: Negative.   Eyes: Negative.   Respiratory: Negative.   Cardiovascular: Negative.   Gastrointestinal: Positive for nausea.  Genitourinary: Negative.   Musculoskeletal: Positive for myalgias.  Skin: Negative.   Neurological: Positive for weakness.  Endo/Heme/Allergies: Negative.   Psychiatric/Behavioral: Positive for depression and substance abuse. The patient is nervous/anxious and has insomnia.     Blood pressure 127/81, pulse 82, temperature 97.8 F (36.6 C), temperature source Oral, resp. rate 18, height 5\' 5"  (1.651 m), weight 73.936 kg (163 lb), last menstrual period 12/08/2006, SpO2 97.00%.Body mass index is 27.12 kg/(m^2).  General Appearance: Fairly Groomed  Engineer, water::   Fair  Speech:  Clear and Coherent  Volume:  fluctuates  Mood:  Anxious, Depressed and Dysphoric  Affect:  Labile and Tearful  Thought Process:  Coherent and Goal Directed  Orientation:  Full (Time, Place, and Person)  Thought Content:  sympoms, worries, concerns  Suicidal Thoughts:  No  Homicidal Thoughts:  No  Memory:  Immediate;   Fair Recent;   Fair Remote;   Fair  Judgement:  Fair  Insight:  Present  Psychomotor Activity:  Restlessness  Concentration:  Fair  Recall:  [employer] of Knowledge:NA  Language: Fair  Akathisia:  No  Handed:    AIMS (if indicated):     Assets:  Housing Vocational/Educational  Sleep:  Number of Hours: 6.5   Musculoskeletal: Strength & Muscle Tone: within normal limits Gait & Station: normal Patient leans: N/A  Current Medications: Current Facility-Administered Medications  Medication Dose Route Frequency Kaire Stary Last Rate Last Dose  . acamprosate (CAMPRAL) tablet 666 mg  666 mg Oral TID WC Nicholaus Bloom, MD      . acetaminophen (TYLENOL) tablet 650 mg  650 mg Oral Q4H PRN Lurena Nida, NP      . alum & mag hydroxide-simeth (MAALOX/MYLANTA) 200-200-20 MG/5ML suspension 30 mL  30 mL Oral Q4H PRN Lurena Nida, NP   30 mL at 10/02/13 1553  . gabapentin (NEURONTIN) capsule 300 mg  300 mg Oral TID Nicholaus Bloom, MD      . hydrOXYzine (ATARAX/VISTARIL) tablet 25 mg  25 mg Oral Q6H PRN Laverle Hobby, PA-C   25 mg at 10/03/13 2120  . ibuprofen (ADVIL,MOTRIN) tablet 600 mg  600  mg Oral Q8H PRN Lurena Nida, NP   600 mg at 10/03/13 1414  . loperamide (IMODIUM) capsule 2-4 mg  2-4 mg Oral PRN Lurena Nida, NP      . LORazepam (ATIVAN) tablet 1 mg  1 mg Oral Q4H PRN Nicholaus Bloom, MD   1 mg at 10/04/13 0840  . LORazepam (ATIVAN) tablet 1 mg  1 mg Oral QID Nicholaus Bloom, MD   1 mg at 10/04/13 1212   Followed by  . [START ON 10/05/2013] LORazepam (ATIVAN) tablet 1 mg  1 mg Oral QID Nicholaus Bloom, MD       Followed by  . [START ON 10/06/2013]  LORazepam (ATIVAN) tablet 1 mg  1 mg Oral BID Nicholaus Bloom, MD       Followed by  . [START ON 10/07/2013] LORazepam (ATIVAN) tablet 1 mg  1 mg Oral Daily Nicholaus Bloom, MD      . magnesium hydroxide (MILK OF MAGNESIA) suspension 30 mL  30 mL Oral Daily PRN Lurena Nida, NP      . multivitamin with minerals tablet 1 tablet  1 tablet Oral Daily Lurena Nida, NP   1 tablet at 10/04/13 0837  . nicotine (NICODERM CQ - dosed in mg/24 hours) patch 21 mg  21 mg Transdermal Daily Lurena Nida, NP   21 mg at 10/04/13 0837  . ondansetron (ZOFRAN-ODT) disintegrating tablet 4 mg  4 mg Oral Q6H PRN Lurena Nida, NP   4 mg at 10/04/13 9381  . thiamine (B-1) injection 100 mg  100 mg Intramuscular Daily Lurena Nida, NP      . thiamine (VITAMIN B-1) tablet 100 mg  100 mg Oral Daily Lurena Nida, NP   100 mg at 10/04/13 0175  . traZODone (DESYREL) tablet 50 mg  50 mg Oral QHS PRN Nicholaus Bloom, MD   50 mg at 10/03/13 2120    Lab Results:  Results for orders placed during the hospital encounter of 10/02/13 (from the past 48 hour(s))  LIPASE, BLOOD     Status: None   Collection Time    10/02/13  7:35 PM      Result Value Ref Range   Lipase 24  11 - 59 U/L   Comment: Performed at [facility]     Status: None   Collection Time    10/02/13  7:35 PM      Result Value Ref Range   Amylase 23  0 - 105 U/L   Comment: Performed at [facility]    Physical Findings: AIMS: Facial and Oral Movements Muscles of Facial Expression: None, normal Lips and Perioral Area: None, normal Jaw: None, normal Tongue: None, normal,Extremity Movements Upper (arms, wrists, hands, fingers): None, normal Lower (legs, knees, ankles, toes): None, normal, Trunk Movements Neck, shoulders, hips: None, normal, Overall Severity Severity of abnormal movements (highest score from questions above): None, normal Incapacitation due to abnormal movements: None, normal Patient's awareness  of abnormal movements (rate only patient's report): No Awareness, Dental Status Current problems with teeth and/or dentures?: No Does patient usually wear dentures?: No  CIWA:  CIWA-Ar Total: 4 COWS:     Treatment Plan Summary: Daily contact with patient to assess and evaluate symptoms and progress in treatment Medication management  Plan: Supportive approach/coping skills/relapse prevention           Motivational interviewing  CBT;mindfulness           Had switched to Ativan Detox as she felt it was more effective           Trial with Neurontin/Campral  Medical Decision Making Problem Points:  Established problem, worsening (2) and Review of psycho-social stressors (1) Data Points:  Review of medication regiment & side effects (2) Review of new medications or change in dosage (2)  I certify that inpatient services furnished can reasonably be expected to improve the patient's condition.   LUGO,IRVING A 10/04/2013, 4:26 PM

## 2013-10-04 NOTE — Clinical Social Work Note (Signed)
CSW provided pt with [facility] (o/p option) and [facility] packet to assist her with making decision of whether to pursue inpatient vs outpatient therapy.   [place], LCSWA  10/04/2013 12:30 PM

## 2013-10-04 NOTE — BHH Group Notes (Signed)
[place] [facility] LCSW Group Therapy  10/04/2013 3:52 PM  Type of Therapy:  Group Therapy  Participation Level:  None  Participation Quality:  Attentive   Affect:  Depressed and Flat  Cognitive:  Lacking  Insight:  None  Engagement in Therapy:  None  Modes of Intervention:  Confrontation, Discussion, Education, Exploration, Problem-solving, Rapport Building, Socialization and Support  Summary of Progress/Problems: Emotion Regulation: This group focused on both positive and negative emotion identification and allowed group members to process ways to identify feelings, regulate negative emotions, and find healthy ways to manage internal/external emotions. Group members were asked to reflect on a time when their reaction to an emotion led to a negative outcome and explored how alternative responses using emotion regulation would have benefited them. Group members were also asked to discuss a time when emotion regulation was utilized when a negative emotion was experienced. Joslyne arrived approximately 20 minutes late to group. She sat quietly and attentively listened as others shared in group but was resistant to active participation. At this time, Millicent continues to show no progress in the group setting and limited insight AEB her inability to contribute to group discussion.    [place], Croy Drumwright LCSWA  10/04/2013, 3:52 PM

## 2013-10-05 NOTE — Progress Notes (Signed)
Patient complained of crying spells and feeling depressed. Denies SI at this time. Contracts for safety. Affect is flat and sad. Pleasant and cooperative. Had complained of pain earlier in her head 10/10. Patient was medicated and pain decreased to 5/10. Also complained of insomnia and received Vistaril and Trazodone prn. Patient is resting quietly at this time with no further complaints. Libby Maw, RN

## 2013-10-05 NOTE — Progress Notes (Signed)
Patient ID: [name], female   DOB: 1956/03/17, 58 y.o.   MRN: [id number] D: pt. In dayroom playing cards, reports anxiety at "4" of 10. A: Writer provided emotional support, encouraged group, reviewed medications. Staff will monitor q5min for safety. R: Pt. Is safe on the unit, attended karaoke.

## 2013-10-05 NOTE — Progress Notes (Signed)
[facility] MD Progress Note  10/05/2013 5:29 PM [name]  MRN:  [id number] Subjective:  [name] continues to have a hard time. She is still unsure if she can quit drinking. She admits to worry, anxiety building up to panic. Understands the possible consequences if she was not to quit but she is still not sure if she can. Increasingly upset with herself as rationally she knows she needs to do it. She is dealing with all the losses she has had: people, health, and now anticipates the loss of the alcohol Diagnosis:   DSM5: Schizophrenia Disorders:  none Obsessive-Compulsive Disorders:  none Trauma-Stressor Disorders:  none Substance/Addictive Disorders:  Alcohol Related Disorder - Severe (303.90) Depressive Disorders:  Major Depressive Disorder - Severe (296.23) Total Time spent with patient: 30 minutes  Axis I: Substance Induced Mood Disorder  ADL's:  Intact  Sleep: Fair  Appetite:  Fair  Suicidal Ideation:  Plan:  denies Intent:  denies Means:  denies Homicidal Ideation:  Plan:  denies Intent:  denies Means:  denies AEB (as evidenced by):  Psychiatric Specialty Exam: Physical Exam  Review of Systems  Constitutional: Positive for malaise/fatigue.  HENT: Negative.   Eyes: Negative.   Respiratory: Negative.   Cardiovascular: Negative.   Genitourinary: Negative.   Musculoskeletal: Negative.   Skin: Negative.   Neurological: Positive for weakness.  Endo/Heme/Allergies: Negative.   Psychiatric/Behavioral: Positive for depression and substance abuse. The patient is nervous/anxious and has insomnia.     Blood pressure 135/80, pulse 79, temperature 98.6 F (37 C), temperature source Oral, resp. rate 18, height 5\' 5"  (1.651 m), weight 73.936 kg (163 lb), last menstrual period 12/08/2006, SpO2 98.00%.Body mass index is 27.12 kg/(m^2).  General Appearance: Fairly Groomed  Engineer, water::  Fair  Speech:  Clear and Coherent  Volume:  fluctuates  Mood:  Anxious, Dysphoric and  Worthless  Affect:  Tearful and anxious, worried  Thought Process:  Coherent and Goal Directed  Orientation:  Full (Time, Place, and Person)  Thought Content:  symptoms, worries, concerns  Suicidal Thoughts:  No  Homicidal Thoughts:  No  Memory:  Immediate;   Fair Recent;   Fair Remote;   Fair  Judgement:  Fair  Insight:  Present  Psychomotor Activity:  Restlessness  Concentration:  Fair  Recall:  [employer] of Knowledge:NA  Language: Fair  Akathisia:  No  Handed:    AIMS (if indicated):     Assets:  Housing Vocational/Educational  Sleep:  Number of Hours: 5.25   Musculoskeletal: Strength & Muscle Tone: within normal limits Gait & Station: normal Patient leans: N/A  Current Medications: Current Facility-Administered Medications  Medication Dose Route Frequency Provider Last Rate Last Dose  . acamprosate (CAMPRAL) tablet 666 mg  666 mg Oral TID WC Nicholaus Bloom, MD   666 mg at 10/05/13 1625  . acetaminophen (TYLENOL) tablet 650 mg  650 mg Oral Q4H PRN Lurena Nida, NP      . alum & mag hydroxide-simeth (MAALOX/MYLANTA) 200-200-20 MG/5ML suspension 30 mL  30 mL Oral Q4H PRN Lurena Nida, NP   30 mL at 10/02/13 1553  . gabapentin (NEURONTIN) capsule 300 mg  300 mg Oral TID Nicholaus Bloom, MD   300 mg at 10/05/13 1625  . hydrOXYzine (ATARAX/VISTARIL) tablet 25 mg  25 mg Oral Q6H PRN Laverle Hobby, PA-C   25 mg at 10/04/13 2141  . ibuprofen (ADVIL,MOTRIN) tablet 600 mg  600 mg Oral Q8H PRN Lurena Nida, NP  600 mg at 10/05/13 1703  . LORazepam (ATIVAN) tablet 1 mg  1 mg Oral Q4H PRN Nicholaus Bloom, MD   1 mg at 10/05/13 1446  . [START ON 10/06/2013] LORazepam (ATIVAN) tablet 1 mg  1 mg Oral BID Nicholaus Bloom, MD       Followed by  . [START ON 10/07/2013] LORazepam (ATIVAN) tablet 1 mg  1 mg Oral Daily Nicholaus Bloom, MD      . magnesium hydroxide (MILK OF MAGNESIA) suspension 30 mL  30 mL Oral Daily PRN Lurena Nida, NP      . multivitamin with minerals tablet 1 tablet  1  tablet Oral Daily Lurena Nida, NP   1 tablet at 10/05/13 0813  . nicotine (NICODERM CQ - dosed in mg/24 hours) patch 21 mg  21 mg Transdermal Daily Lurena Nida, NP   21 mg at 10/05/13 0813  . ondansetron (ZOFRAN-ODT) disintegrating tablet 4 mg  4 mg Oral Q6H PRN Lurena Nida, NP   4 mg at 10/04/13 9485  . thiamine (B-1) injection 100 mg  100 mg Intramuscular Daily Lurena Nida, NP      . thiamine (VITAMIN B-1) tablet 100 mg  100 mg Oral Daily Lurena Nida, NP   100 mg at 10/05/13 0813  . traZODone (DESYREL) tablet 50 mg  50 mg Oral QHS PRN Nicholaus Bloom, MD   50 mg at 10/05/13 0144    Lab Results: No results found for this or any previous visit (from the past 10 hour(s)).  Physical Findings: AIMS: Facial and Oral Movements Muscles of Facial Expression: None, normal Lips and Perioral Area: None, normal Jaw: None, normal Tongue: None, normal,Extremity Movements Upper (arms, wrists, hands, fingers): None, normal Lower (legs, knees, ankles, toes): None, normal, Trunk Movements Neck, shoulders, hips: None, normal, Overall Severity Severity of abnormal movements (highest score from questions above): None, normal Incapacitation due to abnormal movements: None, normal Patient's awareness of abnormal movements (rate only patient's report): No Awareness, Dental Status Current problems with teeth and/or dentures?: No Does patient usually wear dentures?: No  CIWA:  CIWA-Ar Total: 11 COWS:     Treatment Plan Summary: Daily contact with patient to assess and evaluate symptoms and progress in treatment Medication management  Plan: Supportive approach/coping skills/relapse prevention           Continue detox and continue the Neurontin/Campral combo            Reassess and optimize treatment for the co morbidities            Motivational Interviewing             Encourage going to rehab  Medical Decision Making Problem Points:  Review of psycho-social stressors (1) Data Points:  Review  of medication regiment & side effects (2) Review of new medications or change in dosage (2)  I certify that inpatient services furnished can reasonably be expected to improve the patient's condition.   LUGO,IRVING A 10/05/2013, 5:29 PM

## 2013-10-05 NOTE — Progress Notes (Signed)
Pt has been up and active in the milieu today.  She has been tearful at times and has required 2 prn medications for her anxiety/withdrwal symptoms in addition to her scheduled medications. She had 1 mg of Ativan at 1044 and another does at 1446 which did decrease her symptoms.  She rated her depression a 8 hopelessness a 10 and her anxiety a 10+ on her self-inventory.  She is thinking about going to [facility] in [place] she is still uncertain at this time.

## 2013-10-05 NOTE — BHH Group Notes (Signed)
0900 nursing orientation group    The focus of this group is to educate the patient on the purpose and policies of crisis stabilization and provide a format to answer questions about their admission.  The group details unit policies and expectations of patients while admitted.   Pt was an active participant in group and appropriate in sharing.  Her gaol today,"not to get so depressed" and walking on the beach makes her happy and relaxed.

## 2013-10-05 NOTE — BHH Group Notes (Signed)
[place] LCSW Group Therapy  10/05/2013  1:15 PM   Type of Therapy:  Group Therapy  Participation Level:  Active  Participation Quality:  Attentive, Sharing and Supportive  Affect:  Depressed and Tearful  Cognitive:  Alert and Oriented  Insight:  Developing/Improving and Engaged  Engagement in Therapy:  Developing/Improving and Engaged  Modes of Intervention:  Clarification, Confrontation, Discussion, Education, Exploration, Limit-setting, Orientation, Problem-solving, Rapport Building, Art therapist, Socialization and Support  Summary of Progress/Problems: The topic for group was balance in life.  Today's group focused on defining balance in one's own words, identifying things that can knock one off balance, and exploring healthy ways to maintain balance in life. Group members were asked to provide an example of a time when they felt off balance, describe how they handled that situation,and process healthier ways to regain balance in the future. Group members were asked to share the most important tool for maintaining balance that they learned while at [name] and how they plan to apply this method after discharge.  Pt shared that she enjoys having her own space and being indepedent but refers to it as being selfish.  Pt states that she doesn't plan on changing but knows she needs to give up alcohol.  Pt was tearful and was able to process grieving the loss of alcohol and feeling scared of what this looks likes for her.  Pt actively participated and engaged in group discussion.    Regan Lemming, LCSW 10/05/2013  3:05 PM

## 2013-10-05 NOTE — Clinical Social Work Note (Signed)
CSW met with pt individually to discuss d/c plans.  Pt was tearful and was able to process her feelings around giving up alcohol.  Pt explained that giving up alcohol is like grieving the loss of a person by death.  Pt states that she questions if she made the right decision to stop and if she should've just stayed home and drank until she died.  Pt is still unsure if she is ready to stop drinking and ready to go to further inpatient treatment.  Discussed negative consequences of pt's alcohol use.  Discussed pt not in a clear mind to make rational decisions and that inpatient treatment would be best for right now.  Pt agreed to [facility] referral at this time.  Referral made.   Regan Lemming, LCSW 10/05/2013  10:47 AM

## 2013-10-05 NOTE — Progress Notes (Signed)
Adult Activity Group Note  Date: 10/05/13 Time:2:45p  Group Topic: Art  Participation Level: Active  Participation Quality: Appropriate  Affect:  Depressed and Flat  Activity: Patients asked to create art to coincide with daily unit theme using any combination of markers, crayons, color pencils, construction paper, magazine clippings, glue, and scissors. Pt's wrote ideas on leaves of things they wanted to do this summer and posted them on the 300 hall.  Pt drew a picture on her leaf of a bird, representing her love for birds and camping.

## 2013-10-06 DIAGNOSIS — F1910 Other psychoactive substance abuse, uncomplicated: Secondary | ICD-10-CM

## 2013-10-06 MED ORDER — FLUOXETINE HCL 20 MG PO CAPS
20.0000 mg | ORAL_CAPSULE | Freq: Every day | ORAL | Status: DC
  Administered 2013-10-06 – 2013-10-10 (×5): 20 mg via ORAL
  Filled 2013-10-06 (×7): qty 1

## 2013-10-06 NOTE — Progress Notes (Signed)
D:Pt c/o of anxiety. Pt reports that she is having mild nausea and none stomach aches. Pt is pleasant and cooperative interacting on the unit.  A:Gave prn medication for nausea and anxiety. Offered support, encouragement and 15 minute checks.  R:Pt denies si and hi. Safety maintained on the unit.

## 2013-10-06 NOTE — Progress Notes (Signed)
Patient ID: [name], female   DOB: 1955/08/17, 58 y.o.   MRN: [id number] D: Patient reports mood "improving". Pt stated she is feeling better and medication is improving her mood. 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 Pt reports because of her addictive personsolity she needs to be around different people and hope to start new. Pt reports stressor as her being lonely because her son moved out and had to put her mother in a home. Pt mood and affect is depressed and anxious. Pt denies SI/HI/AVH and pain. Pt attended evening AA  group and engaged in discussion.  Cooperative with assessment. No acute distressed noted at this time.   A: Met with pt 1:1. Medications administered as prescribed. Writer encouraged pt to discuss feelings. Pt encouraged to come to staff with any question or concerns.   R: Patient remains safe. She is complaint with medications and denies any adverse reaction. Continue current POC.

## 2013-10-06 NOTE — BHH Group Notes (Signed)
[name] LCSW Aftercare Discharge Planning Group Note   10/06/2013  8:45 AM  Participation Quality:  Did Not Attend - pt sleeping in her room  Regan Lemming, LCSW 10/06/2013 9:07 AM

## 2013-10-06 NOTE — BHH Group Notes (Signed)
[place] LCSW Group Therapy  10/06/2013  1:15 PM   Type of Therapy:  Group Therapy  Participation Level:  Active  Participation Quality:  Attentive, Sharing and Supportive  Affect:  Depressed and Flat  Cognitive:  Alert and Oriented  Insight:  Developing/Improving and Engaged  Engagement in Therapy:  Developing/Improving and Engaged  Modes of Intervention:  Clarification, Confrontation, Discussion, Education, Exploration, Limit-setting, Orientation, Problem-solving, Rapport Building, Art therapist, Socialization and Support  Summary of Progress/Problems: The topic for today was feelings about relapse.  Pt discussed what relapse prevention is to them and identified triggers that they are on the path to relapse.  Pt processed their feeling towards relapse and was able to relate to peers.  Pt discussed coping skills that can be used for relapse prevention.  Pt states that she feels she's never relapsed because she never stopped drinking.  Pt continued to process feelings of grief, in regards to giving up alcohol.  Pt actively participated and was engaged in group discussion.   Regan Lemming, LCSW 10/06/2013  2:28 PM

## 2013-10-06 NOTE — BHH Group Notes (Signed)
Adult Psychoeducational Group Note  Date:  10/06/2013 Time:  10:09 PM  Group Topic/Focus:  AA Meeting  Participation Level:  Minimal  Participation Quality:  Appropriate  Affect:  Flat  Cognitive:  Appropriate  Insight: Appropriate  Engagement in Group:  Limited  Modes of Intervention:  Discussion and Education  Additional Comments:  Raisha explained her situation and that she wants to go to a long term program.  Victorino Sparrow A 10/06/2013, 10:09 PM

## 2013-10-06 NOTE — Progress Notes (Signed)
Patient ID: [name], female   DOB: Aug 25, 1955, 58 y.o.   MRN: [id number] [facility] MD Progress Note  10/06/2013 2:10 PM ANJA NEUZIL  MRN:  [id number]  Subjective:  Amariz continues to have a hard time. She says "I'm doing terrible today. I can't stand being in the day room with all these alcoholics throwing bad languages around. I can't stand the talks, the bad mouth, I can't stand to be around them. I feel awful. It is my fault, I was drinking too much. I did not ask for my life to be saved or when the doctors decided to operate on my pancrease. I wished that I did not survive it. I feel stranded and lost".   [name] rated her depression at #10 today. She presents with teary eyes and sad facial expressions. However, she did respond to therapeutic talk. She adds that she gets to feeling overwhelmed when she starts to realize what alcoholism had done in her life. She denies any SIHI, AVH. Will add Prozac 20 mg to treatment regimen.  Diagnosis:   DSM5: Schizophrenia Disorders:  none Obsessive-Compulsive Disorders:  none Trauma-Stressor Disorders:  none Substance/Addictive Disorders:  Alcohol Related Disorder - Severe (303.90) Depressive Disorders:  Major Depressive Disorder - Severe (296.23) Total Time spent with patient: 30 minutes  Axis I: Substance Induced Mood Disorder  ADL's:  Intact  Sleep: Fair  Appetite:  Fair  Suicidal Ideation:  Plan:  denies Intent:  denies Means:  denies Homicidal Ideation:  Plan:  denies Intent:  denies Means:  denies AEB (as evidenced by):  Psychiatric Specialty Exam: Physical Exam  Review of Systems  Constitutional: Positive for malaise/fatigue.  HENT: Negative.   Eyes: Negative.   Respiratory: Negative.   Cardiovascular: Negative.   Genitourinary: Negative.   Musculoskeletal: Negative.   Skin: Negative.   Neurological: Positive for weakness.  Endo/Heme/Allergies: Negative.   Psychiatric/Behavioral: Positive for depression  and substance abuse. The patient is nervous/anxious and has insomnia.     Blood pressure 124/82, pulse 84, temperature 98.6 F (37 C), temperature source Oral, resp. rate 20, height 5\' 5"  (1.651 m), weight 73.936 kg (163 lb), last menstrual period 12/08/2006, SpO2 98.00%.Body mass index is 27.12 kg/(m^2).  General Appearance: Fairly Groomed  Engineer, water::  Fair  Speech:  Clear and Coherent  Volume:  fluctuates  Mood:  Anxious, Dysphoric and Worthless  Affect:  Tearful and anxious, worried  Thought Process:  Coherent and Goal Directed  Orientation:  Full (Time, Place, and Person)  Thought Content:  symptoms, worries, concerns  Suicidal Thoughts:  No  Homicidal Thoughts:  No  Memory:  Immediate;   Fair Recent;   Fair Remote;   Fair  Judgement:  Fair  Insight:  Present  Psychomotor Activity:  Restlessness  Concentration:  Fair  Recall:  [employer] of Knowledge:NA  Language: Fair  Akathisia:  No  Handed:    AIMS (if indicated):     Assets:  Housing Vocational/Educational  Sleep:  Number of Hours: 6.5   Musculoskeletal: Strength & Muscle Tone: within normal limits Gait & Station: normal Patient leans: N/A  Current Medications: Current Facility-Administered Medications  Medication Dose Route Frequency Provider Last Rate Last Dose  . acamprosate (CAMPRAL) tablet 666 mg  666 mg Oral TID WC Nicholaus Bloom, MD   666 mg at 10/06/13 1116  . acetaminophen (TYLENOL) tablet 650 mg  650 mg Oral Q4H PRN Lurena Nida, NP      . alum & Iris Pert  hydroxide-simeth (MAALOX/MYLANTA) 200-200-20 MG/5ML suspension 30 mL  30 mL Oral Q4H PRN Lurena Nida, NP   30 mL at 10/02/13 1553  . gabapentin (NEURONTIN) capsule 300 mg  300 mg Oral TID Nicholaus Bloom, MD   300 mg at 10/06/13 1116  . hydrOXYzine (ATARAX/VISTARIL) tablet 25 mg  25 mg Oral Q6H PRN Laverle Hobby, PA-C   25 mg at 10/04/13 2141  . ibuprofen (ADVIL,MOTRIN) tablet 600 mg  600 mg Oral Q8H PRN Lurena Nida, NP   600 mg at 10/05/13 1703  .  LORazepam (ATIVAN) tablet 1 mg  1 mg Oral Q4H PRN Nicholaus Bloom, MD   1 mg at 10/06/13 1116  . LORazepam (ATIVAN) tablet 1 mg  1 mg Oral BID Nicholaus Bloom, MD   1 mg at 10/06/13 0818   Followed by  . [START ON 10/07/2013] LORazepam (ATIVAN) tablet 1 mg  1 mg Oral Daily Nicholaus Bloom, MD      . magnesium hydroxide (MILK OF MAGNESIA) suspension 30 mL  30 mL Oral Daily PRN Lurena Nida, NP      . multivitamin with minerals tablet 1 tablet  1 tablet Oral Daily Lurena Nida, NP   1 tablet at 10/06/13 0818  . nicotine (NICODERM CQ - dosed in mg/24 hours) patch 21 mg  21 mg Transdermal Daily Lurena Nida, NP   21 mg at 10/06/13 1740  . ondansetron (ZOFRAN-ODT) disintegrating tablet 4 mg  4 mg Oral Q6H PRN Lurena Nida, NP   4 mg at 10/06/13 1116  . thiamine (B-1) injection 100 mg  100 mg Intramuscular Daily Lurena Nida, NP      . thiamine (VITAMIN B-1) tablet 100 mg  100 mg Oral Daily Lurena Nida, NP   100 mg at 10/06/13 0818  . traZODone (DESYREL) tablet 50 mg  50 mg Oral QHS PRN Nicholaus Bloom, MD   50 mg at 10/05/13 2147    Lab Results: No results found for this or any previous visit (from the past 48 hour(s)).  Physical Findings: AIMS: Facial and Oral Movements Muscles of Facial Expression: None, normal Lips and Perioral Area: None, normal Jaw: None, normal Tongue: None, normal,Extremity Movements Upper (arms, wrists, hands, fingers): None, normal Lower (legs, knees, ankles, toes): None, normal, Trunk Movements Neck, shoulders, hips: None, normal, Overall Severity Severity of abnormal movements (highest score from questions above): None, normal Incapacitation due to abnormal movements: None, normal Patient's awareness of abnormal movements (rate only patient's report): No Awareness, Dental Status Current problems with teeth and/or dentures?: No Does patient usually wear dentures?: No  CIWA:  CIWA-Ar Total: 3 COWS:     Treatment Plan Summary: Daily contact with patient to assess  and evaluate symptoms and progress in treatment Medication management  Plan: 1. Continue crisis management and stabilization.  2. Medication management: Continue Ativan taper for benzodiazepine dependence, continue Neurontin 300 mg for substance withdrawal syndrome, hydroxine 25 mg for anxiety and Trazodone 50 mg for sleep, add Prozac 20 mg daily for depression. 3. Encouraged patient to attend groups and participate in group counseling sessions and activities.   4. Continue current treatment plan.  5. Address health issues: Vitals reviewed and stable.   Medical Decision Making Problem Points:  Review of psycho-social stressors (1) Data Points:  Review of medication regiment & side effects (2) Review of new medications or change in dosage (2)  I certify that inpatient services furnished can reasonably be  expected to improve the patient's condition.   Lindell Spar I, PMHNP-BC 10/06/2013, 2:10 PM  I agreed with the findings, treatment and disposition plan of this patient. Berniece Andreas, MD

## 2013-10-06 NOTE — Tx Team (Signed)
Interdisciplinary Treatment Plan Update (Adult)  Date: 10/06/2013  Time Reviewed:  9:45 AM  Progress in Treatment: Attending groups: Yes Participating in groups:  Yes Taking medication as prescribed:  Yes Tolerating medication:  Yes Family/Significant othe contact made: No, N/A Patient understands diagnosis:  Yes Discussing patient identified problems/goals with staff:  Yes Medical problems stabilized or resolved:  Yes Denies suicidal/homicidal ideation: Yes Issues/concerns per patient self-inventory:  Yes Other:  New problem(s) identified: N/A  Discharge Plan or Barriers: CSW assessing for appropriate referrals.  Pt is open to further inpatient treatment at this time.  [facility] is reviewing pt's info now.    Reason for Continuation of Hospitalization: Anxiety Depression Medication Stabilization Detox  Comments: N/A  Estimated length of stay: 3-5 days  For review of initial/current patient goals, please see plan of care.  Attendees: Patient:     Family:     Physician:  Dr. Adele Schilder  10/06/2013 8:13 AM   Nursing:   Marilynne Halsted, RN 10/06/2013 8:13 AM   Clinical Social Worker:  Regan Lemming, LCSW 10/06/2013 8:13 AM   Other: Lars Pinks, RN case manager 10/06/2013 8:13 AM   Other:  Peri Maris, LCSWA 10/06/2013 8:13 AM   Other:  Agustina Caroli, NP 10/06/2013 8:13 AM   Other:  Janalyn Shy, RN 10/06/2013 9:38 AM   Other: Marcina Millard, LCSW 10/06/2013 9:38 AM   Other:    Other:    Other:    Other:    Other:     Scribe for Treatment Team:   Ane Payment, 10/06/2013 , 8:13 AM

## 2013-10-06 NOTE — Progress Notes (Signed)
Nursing Progress Note : Pt reports relieve from vistaril feels less anxious, mood depressed. Attempting to interact more with peers. Compliant with medications and programming.Contracts for safety maintained on q 15 minute checks .

## 2013-10-07 NOTE — Progress Notes (Signed)
D: pt has been having crying spells off and on this evening. Pt stated it is due to hearing everyone's story and just thinking back on her own . Pt c/o anxiety and have knots in her stomach due to be anxious. denies si/hi//avh. Rates pain 8/10. Pt has a depressed/ anxious mood.  Pt is just overwhelmed with the detox process at the moment and is wanting someone to talk her through it. A: 1:1 time given. Support and encouragement given. q 15 min safety checks. scheduled medications given R: pt remains safe on unit. No new signs of stress noted

## 2013-10-07 NOTE — BHH Group Notes (Signed)
[place] Group Notes:  (Nursing/MHT/Case Management/Adjunct)  Date:  10/07/2013  Time:  3:52 PM  Type of Therapy:  Psychoeducational Skills  Participation Level:  Active  Participation Quality:  Appropriate  Affect:  Appropriate  Cognitive:  Appropriate  Insight:  Appropriate  Engagement in Group:  Engaged  Modes of Intervention:  Discussion  Summary of Progress/Problems: Pt did attend self inventory group, pt reported that she was negative SI/HI, no AH/VH noted. Pt rated her depression as a 5, and her helplessness/hopelessness as a 10.     Pt reported concerns about rapid mood swings and severe anxiety, pt advised that the doctor will be made aware.   Benancio Deeds Shanta 10/07/2013, 3:52 PM

## 2013-10-07 NOTE — Progress Notes (Signed)
Patient did attend the evening speaker AA meeting.  

## 2013-10-07 NOTE — [facility] Group Notes (Signed)
[place] Group Notes:  (Clinical Social Work)  10/07/2013     10-11AM  Summary of Progress/Problems:   The main focus of today's process group was for the patient to identify ways in which they have in the past sabotaged their own recovery. Motivational Interviewing and a worksheet were utilized to help patients explore in depth the perceived benefits and costs of their substance use, as well as the potential benefits and costs of stopping.  The Stages of Change were explained using a handout, with an emphasis on making plans to deal with sabotaging behaviors proactively.  The patient expressed that her self-sabotaging behavior is, quite simply, using drugs and alcohol.  When the explanation of the Stages of Change came to the part where clinician was discussing that some individuals stay in Contemplation Stage, and others make a Decision to change and move on to Preparation Stage, she became agitated and stated that it is not her decision at all.  When asked to explain this, she stated with irritation that her alcoholism and drug addiction is a disease, that she does not have a choice about it.  It was pointed out by CSW and other group members that having the disease(s) is not a choice, but managing them is a choice.  She did not disagree with this, but did leave the room at that point.    Type of Therapy:  Group Therapy - Process   Participation Level:  Active  Participation Quality:  Attentive and Resistant  Affect:  Depressed and Irritable  Cognitive:  Alert  Insight:  Limited  Engagement in Therapy:  Improving  Modes of Intervention:  Education, Support and Processing, Motivational Interviewing  Selmer Dominion, LCSW 10/07/2013, 11:16 AM

## 2013-10-07 NOTE — [facility] Group Notes (Signed)
[facility] Group Notes:  (Nursing/MHT/Case Management/Adjunct)  Date:  10/07/2013  Time:  3:40 PM  Type of Therapy:  Psychoeducational Skills  Participation Level:  Active  Participation Quality:  Appropriate  Affect:  Appropriate  Cognitive:  Appropriate  Insight:  Appropriate  Engagement in Group:  Engaged  Modes of Intervention:  Discussion  Summary of Progress/Problems: Self esteem Psychoeducational group. Pt had good insight. Talked about positive self esteem attributes and negative.   Mindi Slicker M 10/07/2013, 3:40 PM

## 2013-10-07 NOTE — Progress Notes (Signed)
Adult Psychoeducational Group Note  Date:  10/07/2013 Time:  3:35 PM  Group Topic/Focus:  Therapeutic Activity  Participation Level:  Active  Participation Quality:  Attentive, Sharing and Supportive  Affect:  Appropriate  Cognitive:  Appropriate  Insight: Appropriate  Engagement in Group:  Improving and Supportive  Modes of Intervention:  Activity, Socialization and Support  Elisha Headland 10/07/2013, 3:35 PM

## 2013-10-07 NOTE — Progress Notes (Addendum)
Pt is very tearful this am.She told the staff that she does not have anything to do with her family and is trying to sort her life out.Pt rates her depression a 5/10 and her hopelessness a 2/10. Pt does c/o severe anxiety and was given her scheduled ativan for this. 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 She stated he would let the nurse know later. 11am-Pt was very tearful telling the nurse she was not sure what she needed. The pt was going to make a list of the type of place she felt would be good for her. Pt shared that she was a terrible mother but that her son has forgiven her. She stated she watched her mother for 20 years and was not able to continue so her siblings are mad now they have to take care of their mom.Pt also stated she lost two days of work due to being drunk. After her conversation with the nurse pt stated she did feel better. Report to Ethel,

## 2013-10-07 NOTE — Progress Notes (Addendum)
Patient ID: [name], female   DOB: 1956-01-08, 58 y.o.   MRN: [id number] [facility] MD Progress Note  10/07/2013 11:43 AM [name]  MRN:  [id number]  Subjective:  [name] states she was having a good day until group time. During AM group she got very tearful and depressed, states they were talking about their life stories. She states her heart cant bare it.I feel awful. It is my fault, I was drinking too much. I did not ask for my life to be saved or when the doctors decided to operate on my pancrease. I wished that I did not survive it. I feel stranded and lost." States she started having really bad dreams about alcohol, "dream was about me getting alcohol, I tore up my neighbors house looking for alcohol." Upon discharge she plans to attend a long-term treatment. States [name] is checking into [employer], and she is waiting to hear back. Pt does not feel like she is ready to be discharged at this time, stating that if I go home tomorrow the first thing I will do is buy a drink.    [name] rated her depression at 10/10 and anxiety 10/10 today. She presents with teary eyes and sad facial expressions. However, she did respond to therapeutic talk. She adds that she gets to feeling overwhelmed when she starts to realize what alcoholism had done in her life. She denies any SIHI, AVH. Will add Prozac 20 mg to treatment regimen, denies any side effects at this time.   Diagnosis:   DSM5: Schizophrenia Disorders:  none Obsessive-Compulsive Disorders:  none Trauma-Stressor Disorders:  none Substance/Addictive Disorders:  Alcohol Related Disorder - Severe (303.90) Depressive Disorders:  Major Depressive Disorder - Severe (296.23) Total Time spent with patient: 30 minutes  Axis I: Substance Induced Mood Disorder  ADL's:  Intact  Sleep: Fair  Appetite:  Fair  Suicidal Ideation:  Plan:  denies Intent:  denies Means:  denies Homicidal Ideation:  Plan:  denies Intent:   denies Means:  denies AEB (as evidenced by):  Psychiatric Specialty Exam: Physical Exam  Constitutional: She is oriented to person, place, and time. She appears well-developed and well-nourished.  Neck: Normal range of motion.  Musculoskeletal: Normal range of motion.  Neurological: She is alert and oriented to person, place, and time.  Skin: Skin is warm and dry.    Review of Systems  Constitutional: Positive for malaise/fatigue.  HENT: Negative.   Eyes: Negative.   Respiratory: Negative.   Cardiovascular: Negative.   Genitourinary: Negative.   Musculoskeletal: Negative.   Skin: Negative.   Neurological: Positive for weakness.  Endo/Heme/Allergies: Negative.   Psychiatric/Behavioral: Positive for depression and substance abuse. The patient is nervous/anxious and has insomnia.     Blood pressure 121/80, pulse 84, temperature 97.7 F (36.5 C), temperature source Oral, resp. rate 20, height 5\' 5"  (1.651 m), weight 73.936 kg (163 lb), last menstrual period 12/08/2006, SpO2 98.00%.Body mass index is 27.12 kg/(m^2).  General Appearance: Fairly Groomed  Engineer, water::  Fair  Speech:  Clear and Coherent  Volume:  fluctuates  Mood:  Anxious, Dysphoric and Worthless  Affect:  Tearful and anxious, worried  Thought Process:  Coherent and Goal Directed  Orientation:  Full (Time, Place, and Person)  Thought Content:  symptoms, worries, concerns  Suicidal Thoughts:  No  Homicidal Thoughts:  No  Memory:  Immediate;   Fair Recent;   Fair Remote;   Fair  Judgement:  Fair  Insight:  Present  Psychomotor  Activity:  Restlessness  Concentration:  Fair  Recall:  [employer] of Knowledge:NA  Language: Fair  Akathisia:  No  Handed:    AIMS (if indicated):     Assets:  Housing Vocational/Educational  Sleep:  Number of Hours: 6   Musculoskeletal: Strength & Muscle Tone: within normal limits Gait & Station: normal Patient leans: N/A  Current Medications: Current Facility-Administered  Medications  Medication Dose Route Frequency Provider Last Rate Last Dose  . acamprosate (CAMPRAL) tablet 666 mg  666 mg Oral TID WC Nicholaus Bloom, MD   666 mg at 10/07/13 1137  . acetaminophen (TYLENOL) tablet 650 mg  650 mg Oral Q4H PRN Lurena Nida, NP      . alum & mag hydroxide-simeth (MAALOX/MYLANTA) 200-200-20 MG/5ML suspension 30 mL  30 mL Oral Q4H PRN Lurena Nida, NP   30 mL at 10/06/13 1414  . FLUoxetine (PROZAC) capsule 20 mg  20 mg Oral Daily Encarnacion Slates, NP   20 mg at 10/07/13 0814  . gabapentin (NEURONTIN) capsule 300 mg  300 mg Oral TID Nicholaus Bloom, MD   300 mg at 10/07/13 1137  . hydrOXYzine (ATARAX/VISTARIL) tablet 25 mg  25 mg Oral Q6H PRN Laverle Hobby, PA-C   25 mg at 10/06/13 1414  . ibuprofen (ADVIL,MOTRIN) tablet 600 mg  600 mg Oral Q8H PRN Lurena Nida, NP   600 mg at 10/05/13 1703  . LORazepam (ATIVAN) tablet 1 mg  1 mg Oral Q4H PRN Nicholaus Bloom, MD   1 mg at 10/07/13 1138  . magnesium hydroxide (MILK OF MAGNESIA) suspension 30 mL  30 mL Oral Daily PRN Lurena Nida, NP      . multivitamin with minerals tablet 1 tablet  1 tablet Oral Daily Lurena Nida, NP   1 tablet at 10/07/13 0814  . nicotine (NICODERM CQ - dosed in mg/24 hours) patch 21 mg  21 mg Transdermal Daily Lurena Nida, NP   21 mg at 10/07/13 1610  . thiamine (B-1) injection 100 mg  100 mg Intramuscular Daily Lurena Nida, NP      . thiamine (VITAMIN B-1) tablet 100 mg  100 mg Oral Daily Lurena Nida, NP   100 mg at 10/07/13 0814  . traZODone (DESYREL) tablet 50 mg  50 mg Oral QHS PRN Nicholaus Bloom, MD   50 mg at 10/06/13 2118    Lab Results: No results found for this or any previous visit (from the past 48 hour(s)).  Physical Findings: AIMS: Facial and Oral Movements Muscles of Facial Expression: None, normal Lips and Perioral Area: None, normal Jaw: None, normal Tongue: None, normal,Extremity Movements Upper (arms, wrists, hands, fingers): None, normal Lower (legs, knees, ankles, toes):  None, normal, Trunk Movements Neck, shoulders, hips: None, normal, Overall Severity Severity of abnormal movements (highest score from questions above): None, normal Incapacitation due to abnormal movements: None, normal Patient's awareness of abnormal movements (rate only patient's report): No Awareness, Dental Status Current problems with teeth and/or dentures?: No Does patient usually wear dentures?: No  CIWA:  CIWA-Ar Total: 3 COWS:     Treatment Plan Summary: Daily contact with patient to assess and evaluate symptoms and progress in treatment Medication management  Plan: 1. Continue crisis management and stabilization.  2. Medication management: Continue Ativan taper for benzodiazepine dependence, continue Neurontin 300 mg for substance withdrawal syndrome, hydroxine 25 mg for anxiety and Trazodone 50 mg for sleep, add Prozac 20 mg  daily for depression. 3. Encouraged patient to attend groups and participate in group counseling sessions and activities.   4. Continue current treatment plan.  5. Address health issues: Vitals reviewed and stable.   Medical Decision Making Problem Points:  Review of psycho-social stressors (1) Data Points:  Review of medication regiment & side effects (2) Review of new medications or change in dosage (2)  I certify that inpatient services furnished can reasonably be expected to improve the patient's condition.   Nanci Pina, FNP-BC 10/07/2013, 11:43 AM  Reviewed the information documented and agree with the treatment plan.  JONNALAGADDA,JANARDHAHA R. 10/10/2013 10:10 AM

## 2013-10-08 MED ORDER — ONDANSETRON 4 MG PO TBDP
4.0000 mg | ORAL_TABLET | Freq: Three times a day (TID) | ORAL | Status: DC | PRN
  Administered 2013-10-08 – 2013-10-09 (×2): 4 mg via ORAL
  Filled 2013-10-08 (×2): qty 1
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
## 2013-10-08 NOTE — [facility] Group Notes (Signed)
[place] Group Notes:  (Nursing/MHT/Case Management/Adjunct)  Date:  10/08/2013  Time:  9:49 AM  Type of Therapy:  Nurse Education  Participation Level:  Active  Participation Quality:  Appropriate  Affect:  Appropriate  Cognitive:  Appropriate  Insight:  Appropriate  Engagement in Group:  Engaged  Modes of Intervention:  Discussion  Summary of Progress/Problems: self inventory: pt wanting to know what medications she is on. Pt stated she is having mood swings and is wanting to get on something to help with them  Tretha Sciara 10/08/2013, 9:49 AM

## 2013-10-08 NOTE — Progress Notes (Addendum)
Patient ID: [name], female   DOB: 1956-03-22, 58 y.o.   MRN: [id number] [facility] MD Progress Note  10/08/2013 2:15 PM [name]  MRN:  [id number]  Subjective:  [name] states she is having a good day so far, still quite emotional during group time. During AM group she got very agitated and depressed, states they were talking about their life stories. By nature she is a compassionate person and some of things she hears is nerve wrecking. She gets very emotional and doesn't like group settings. Upon discharge she plans to attend a intensive outpatient therapy, which will likely work better for the patient. Pt does not feel like she is ready to be discharged at this time, stating that if I go home tomorrow the first thing I will do is buy a drink.    [name] DownerHassan [name] rated her depression at 4/10 and anxiety 10/10 today, she states her emotions are like "yo-yo it is causing her a lot of anxiety not being able to do what she normally does she as be alone, walks and having choices. She presents with sad facial expressions. However, she did respond to therapeutic talk. She adds that she gets to feeling overwhelmed when she starts to realize what alcoholism had done in her life. She denies any SIHI, AVH. Will add Prozac 20 mg to treatment regimen, denies any side effects at this time.   Diagnosis:   DSM5: Schizophrenia Disorders:  none Obsessive-Compulsive Disorders:  none Trauma-Stressor Disorders:  none Substance/Addictive Disorders:  Alcohol Related Disorder - Severe (303.90) Depressive Disorders:  Major Depressive Disorder - Severe (296.23) Total Time spent with patient: 30 minutes  Axis I: Substance Induced Mood Disorder  ADL's:  Intact  Sleep: Good  Appetite:  Fair  Suicidal Ideation:  Plan:  denies Intent:  denies Means:  denies Homicidal Ideation:  Plan:  denies Intent:  denies Means:  denies AEB (as evidenced by):  Psychiatric Specialty Exam: Physical Exam   Constitutional: She is oriented to person, place, and time. She appears well-developed and well-nourished.  Neck: Normal range of motion.  Musculoskeletal: Normal range of motion.  Neurological: She is alert and oriented to person, place, and time.  Skin: Skin is warm and dry.    Review of Systems  Constitutional: Positive for malaise/fatigue.  HENT: Negative.   Eyes: Negative.   Respiratory: Negative.   Cardiovascular: Negative.   Genitourinary: Negative.   Musculoskeletal: Negative.   Skin: Negative.   Neurological: Positive for weakness.  Endo/Heme/Allergies: Negative.   Psychiatric/Behavioral: Positive for depression and substance abuse. The patient is nervous/anxious and has insomnia.     Blood pressure 133/82, pulse 79, temperature 97.7 F (36.5 C), temperature source Oral, resp. rate 20, height 5\' 5"  (1.651 m), weight 73.936 kg (163 lb), last menstrual period 12/08/2006, SpO2 98.00%.Body mass index is 27.12 kg/(m^2).  General Appearance: Fairly Groomed  Engineer, water::  Fair  Speech:  Clear and Coherent  Volume:  fluctuates  Mood:  Anxious, Dysphoric and Worthless  Affect:  anxious, worried  Thought Process:  Coherent and Goal Directed  Orientation:  Full (Time, Place, and Person)  Thought Content:  symptoms, worries, concerns  Suicidal Thoughts:  No  Homicidal Thoughts:  No  Memory:  Immediate;   Fair Recent;   Fair Remote;   Fair  Judgement:  Fair  Insight:  Present  Psychomotor Activity:  Normal  Concentration:  Fair  Recall:  [employer] of Knowledge:NA  Language: Fair  Akathisia:  No  Handed:    AIMS (if indicated):     Assets:  Housing Vocational/Educational  Sleep:  Number of Hours: 6.75   Musculoskeletal: Strength & Muscle Tone: within normal limits Gait & Station: normal Patient leans: N/A  Current Medications: Current Facility-Administered Medications  Medication Dose Route Frequency Provider Last Rate Last Dose  . acamprosate (CAMPRAL) tablet  666 mg  666 mg Oral TID WC Nicholaus Bloom, MD   666 mg at 10/08/13 1114  . acetaminophen (TYLENOL) tablet 650 mg  650 mg Oral Q4H PRN Lurena Nida, NP      . alum & mag hydroxide-simeth (MAALOX/MYLANTA) 200-200-20 MG/5ML suspension 30 mL  30 mL Oral Q4H PRN Lurena Nida, NP   30 mL at 10/06/13 1414  . FLUoxetine (PROZAC) capsule 20 mg  20 mg Oral Daily Encarnacion Slates, NP   20 mg at 10/08/13 0733  . gabapentin (NEURONTIN) capsule 300 mg  300 mg Oral TID Nicholaus Bloom, MD   300 mg at 10/08/13 1114  . hydrOXYzine (ATARAX/VISTARIL) tablet 25 mg  25 mg Oral Q6H PRN Laverle Hobby, PA-C   25 mg at 10/08/13 1115  . ibuprofen (ADVIL,MOTRIN) tablet 600 mg  600 mg Oral Q8H PRN Lurena Nida, NP   600 mg at 10/07/13 2124  . LORazepam (ATIVAN) tablet 1 mg  1 mg Oral Q4H PRN Nicholaus Bloom, MD   1 mg at 10/08/13 0940  . magnesium hydroxide (MILK OF MAGNESIA) suspension 30 mL  30 mL Oral Daily PRN Lurena Nida, NP   30 mL at 10/08/13 0733  . multivitamin with minerals tablet 1 tablet  1 tablet Oral Daily Lurena Nida, NP   1 tablet at 10/08/13 [phone]  . nicotine (NICODERM CQ - dosed in mg/24 hours) patch 21 mg  21 mg Transdermal Daily Lurena Nida, NP   21 mg at 10/08/13 0800  . thiamine (B-1) injection 100 mg  100 mg Intramuscular Daily Lurena Nida, NP      . thiamine (VITAMIN B-1) tablet 100 mg  100 mg Oral Daily Lurena Nida, NP   100 mg at 10/08/13 0733  . traZODone (DESYREL) tablet 50 mg  50 mg Oral QHS PRN Nicholaus Bloom, MD   50 mg at 10/07/13 2124    Lab Results: No results found for this or any previous visit (from the past 48 hour(s)).  Physical Findings: AIMS: Facial and Oral Movements Muscles of Facial Expression: None, normal Lips and Perioral Area: None, normal Jaw: None, normal Tongue: None, normal,Extremity Movements Upper (arms, wrists, hands, fingers): None, normal Lower (legs, knees, ankles, toes): None, normal, Trunk Movements Neck, shoulders, hips: None, normal, Overall  Severity Severity of abnormal movements (highest score from questions above): None, normal Incapacitation due to abnormal movements: None, normal Patient's awareness of abnormal movements (rate only patient's report): No Awareness, Dental Status Current problems with teeth and/or dentures?: No Does patient usually wear dentures?: No  CIWA:  CIWA-Ar Total: 3 COWS:     Treatment Plan Summary: Daily contact with patient to assess and evaluate symptoms and progress in treatment Medication management  Plan: 1. Continue crisis management and stabilization.  2. Medication management: Continue Ativan taper for benzodiazepine dependence, continue Neurontin 300 mg for substance withdrawal syndrome, hydroxine 25 mg for anxiety and Trazodone 50 mg for sleep, add Prozac 20 mg daily for depression. 3. Encouraged patient to attend groups and participate in group counseling sessions and activities.   4.  Continue current treatment plan.  5. Address health issues: Vitals reviewed and stable.   Medical Decision Making Problem Points:  Review of psycho-social stressors (1) Data Points:  Review of medication regiment & side effects (2) Review of new medications or change in dosage (2)  I certify that inpatient services furnished can reasonably be expected to improve the patient's condition.   Nanci Pina, FNP-BC 10/08/2013, 2:15 PM  Reviewed the information documented and agree with the treatment plan.  JONNALAGADDA,JANARDHAHA R. 10/10/2013 10:05 AM

## 2013-10-08 NOTE — Progress Notes (Signed)
Patient did attend the evening speaker AA meeting.  

## 2013-10-08 NOTE — Progress Notes (Signed)
Patient slept all through the night. Showered and went to the dinning for breakfast. Pt. Stated "I had a good night sleep"

## 2013-10-08 NOTE — Progress Notes (Signed)
D: pt denies si/hi/avh. Pt rates depression 8/10 and hopelessness 1/10. Pt has had crying spells, and is wanting to get on a mood stabilizer. Pt wrote on self inventory that, " please may we have quiet time, i get so aggravated and frustrated with all the constant, useless talking." pt also wrote, " i am an alcoholic, i'd rather drink than take medications." pt c/o anxiety as only constant withdrawal symptom.  A: ativan and vistaril given for anxiety. scheduled medications given. q 15 min safety check. Support and encouragement given R: pt remains safe on unit. No signs of stress noted.

## 2013-10-08 NOTE — Progress Notes (Signed)
Patient has had frequent needs this evening. Complaining of nausea, constipation and anxiety. Remains flat and depressed. Support and reassurance given. Medicated for aforementioned complaints. Nausea was decreased with zofran and ginger ale and ativan was helpful in decreasing patient's anxiety. No results as of yet with MOM for constipation. She denies SI/HI/AVH and remains safe at this time. [name]

## 2013-10-09 MED ORDER — LORAZEPAM 1 MG PO TABS
1.0000 mg | ORAL_TABLET | Freq: Four times a day (QID) | ORAL | Status: DC | PRN
  Administered 2013-10-09 – 2013-10-10 (×3): 1 mg via ORAL
  Filled 2013-10-09 (×3): qty 1

## 2013-10-09 NOTE — Progress Notes (Signed)
D: Pt denies SI/HI/AVH. Pt is pleasant and cooperative. Pt stated she does not really know why she relapsed, but plans to go to the gym, work, and attend AA to keep from relapsing again.   A: Pt was offered support and encouragement. Pt was given scheduled medications. Pt was encourage to attend groups. Q 15 minute checks were done for safety.   R:Pt attends groups and interacts well with peers and staff. Pt is taking medication. Pt has no complaints at this time.Pt receptive to treatment and safety maintained on unit.

## 2013-10-09 NOTE — Progress Notes (Signed)
Patient ID: [name], female   DOB: 1955-11-04, 58 y.o.   MRN: [id number] Patient reports she slept well and her appetite is good.  Her ability to pay attention is good and she denies thoughts of self harm.  A- Supported patient.  Talked to her about her recovery plan.  R- Patient says that working and going to the gym are activities she enjoys that she plans to pursue.  She also is committed to returning to [name] and being active in the program.  Patients fiance is supportive and sober.  She is questioning whether she wants or needs to be on antidepressant.  She is presently taking prozac.  Talked with patient about antidepressants.  She plans to talk with MD about continuing on one.

## 2013-10-09 NOTE — Progress Notes (Signed)
Adult Psychoeducational Group Note  Date:  10/09/2013 Time:  10:06 PM  Group Topic/Focus:  Wrap-Up Group:   The focus of this group is to help patients review their daily goal of treatment and discuss progress on daily workbooks.  Participation Level:  Did Not Attend  Participation Quality:  Drowsy  Affect:  Irritable  Cognitive:  Confused  Insight: Lacking  Engagement in Group:  None  Modes of Intervention:  None  Additional Comments:  PT was not in group Pt was sleep in bed during group   [place], Delton R 10/09/2013, 10:06 PM

## 2013-10-09 NOTE — Clinical Social Work Note (Signed)
Pt was accepted to [facility] for 28 day treatment, with $2,300 fee, asking for $800 on admission.  CSW informed pt of this but pt declined to go, stating that she wants to return home, return to work and go to [employer].  Pt declined any outpatient referrals as well.    Regan Lemming, LCSW 10/09/2013  1:06 PM

## 2013-10-09 NOTE — Progress Notes (Signed)
[name] MD Progress Note  10/09/2013 1:50 PM [name]  MRN:  [id number] Subjective:  Has been using the Ativan every day at least twice. She would like to stay on it. States she had it  prescribed to her after she had her surgery. States that she would take an Ativan in lew of a drink. She states she does not want to go to a residential treatment program and that she thinks she can maintain sobriety without having to go. She states she likes to work, likes her job and that she thinks that by resuming exercise and going to [place] she will be able to make it. She is willing to pursue outpatient counseling. She is willing to be weaned off the Ativan Diagnosis:   DSM5: Schizophrenia Disorders:  none Obsessive-Compulsive Disorders:  none Trauma-Stressor Disorders:  none Substance/Addictive Disorders:  Alcohol Related Disorder - Severe (303.90) Depressive Disorders:  Major Depressive Disorder - Moderate (296.22) Total Time spent with patient: 30 minutes  Axis I: Substance Induced Mood Disorder  ADL's:  Intact  Sleep: Fair  Appetite:  Fair  Suicidal Ideation:  Plan:  denies Intent:  denies Means:  denies Homicidal Ideation:  Plan:  denies Intent:  denies Means:  denies AEB (as evidenced by):  Psychiatric Specialty Exam: Physical Exam  Review of Systems  Constitutional: Negative.   HENT: Negative.   Eyes: Negative.   Respiratory: Negative.   Cardiovascular: Negative.   Gastrointestinal: Negative.   Genitourinary: Negative.   Musculoskeletal: Negative.   Skin: Negative.   Neurological: Negative.   Endo/Heme/Allergies: Negative.   Psychiatric/Behavioral: Positive for substance abuse. The patient is nervous/anxious.     Blood pressure 120/73, pulse 84, temperature 98.1 F (36.7 C), temperature source Oral, resp. rate 16, height 5\' 5"  (1.651 m), weight 73.936 kg (163 lb), last menstrual period 12/08/2006, SpO2 98.00%.Body mass index is 27.12 kg/(m^2).  General Appearance:  Fairly Groomed  Engineer, water::  Fair  Speech:  Clear and Coherent  Volume:  Normal  Mood:  Anxious  Affect:  Appropriate  Thought Process:  Coherent and Goal Directed  Orientation:  Full (Time, Place, and Person)  Thought Content:  plans she moves on, worries, concerns, relpse prevention plan  Suicidal Thoughts:  No  Homicidal Thoughts:  No  Memory:  Immediate;   Fair Recent;   Fair Remote;   Fair  Judgement:  Fair  Insight:  Present  Psychomotor Activity:  Normal  Concentration:  Fair  Recall:  [employer] of Knowledge:NA  Language: Fair  Akathisia:  No  Handed:    AIMS (if indicated):     Assets:  Desire for Improvement Housing Vocational/Educational  Sleep:  Number of Hours: 6.5   Musculoskeletal: Strength & Muscle Tone: within normal limits Gait & Station: normal Patient leans: N/A  Current Medications: Current Facility-Administered Medications  Medication Dose Route Frequency Provider Last Rate Last Dose  . acamprosate (CAMPRAL) tablet 666 mg  666 mg Oral TID WC Nicholaus Bloom, MD   666 mg at 10/09/13 1155  . acetaminophen (TYLENOL) tablet 650 mg  650 mg Oral Q4H PRN Lurena Nida, NP      . alum & mag hydroxide-simeth (MAALOX/MYLANTA) 200-200-20 MG/5ML suspension 30 mL  30 mL Oral Q4H PRN Lurena Nida, NP   30 mL at 10/06/13 1414  . FLUoxetine (PROZAC) capsule 20 mg  20 mg Oral Daily Encarnacion Slates, NP   20 mg at 10/09/13 0813  . gabapentin (NEURONTIN) capsule 300 mg  300 mg Oral TID Nicholaus Bloom, MD   300 mg at 10/09/13 1156  . hydrOXYzine (ATARAX/VISTARIL) tablet 25 mg  25 mg Oral Q6H PRN Laverle Hobby, PA-C   25 mg at 10/08/13 1815  . ibuprofen (ADVIL,MOTRIN) tablet 600 mg  600 mg Oral Q8H PRN Lurena Nida, NP   600 mg at 10/07/13 2124  . LORazepam (ATIVAN) tablet 1 mg  1 mg Oral Q6H PRN Nicholaus Bloom, MD   1 mg at 10/09/13 1157  . magnesium hydroxide (MILK OF MAGNESIA) suspension 30 mL  30 mL Oral Daily PRN Lurena Nida, NP   30 mL at 10/08/13 2144  .  multivitamin with minerals tablet 1 tablet  1 tablet Oral Daily Lurena Nida, NP   1 tablet at 10/09/13 0813  . nicotine (NICODERM CQ - dosed in mg/24 hours) patch 21 mg  21 mg Transdermal Daily Lurena Nida, NP   21 mg at 10/09/13 0814  . ondansetron (ZOFRAN-ODT) disintegrating tablet 4 mg  4 mg Oral Q8H PRN Benjamine Mola, FNP   4 mg at 10/08/13 1948  . thiamine (B-1) injection 100 mg  100 mg Intramuscular Daily Lurena Nida, NP      . thiamine (VITAMIN B-1) tablet 100 mg  100 mg Oral Daily Lurena Nida, NP   100 mg at 10/09/13 0813  . traZODone (DESYREL) tablet 50 mg  50 mg Oral QHS PRN Nicholaus Bloom, MD   50 mg at 10/08/13 2118    Lab Results: No results found for this or any previous visit (from the past 48 hour(s)).  Physical Findings: AIMS: Facial and Oral Movements Muscles of Facial Expression: None, normal Lips and Perioral Area: None, normal Jaw: None, normal Tongue: None, normal,Extremity Movements Upper (arms, wrists, hands, fingers): None, normal Lower (legs, knees, ankles, toes): None, normal, Trunk Movements Neck, shoulders, hips: None, normal, Overall Severity Severity of abnormal movements (highest score from questions above): None, normal Incapacitation due to abnormal movements: None, normal Patient's awareness of abnormal movements (rate only patient's report): No Awareness, Dental Status Current problems with teeth and/or dentures?: No Does patient usually wear dentures?: No  CIWA:  CIWA-Ar Total: 3 COWS:     Treatment Plan Summary: Daily contact with patient to assess and evaluate symptoms and progress in treatment Medication management  Plan: Supportive approach/coping skills/relapse prevention           Wean off Ativan ( one extra dose today, one in the AM then D/C)  and plan for D/C in the           AM             Medical Decision Making Problem Points:  Review of psycho-social stressors (1) Data Points:  Review of medication regiment & side effects  (2)  I certify that inpatient services furnished can reasonably be expected to improve the patient's condition.   LUGO,IRVING A 10/09/2013, 1:50 PM

## 2013-10-09 NOTE — BHH Group Notes (Signed)
[facility] LCSW Aftercare Discharge Planning Group Note   10/09/2013 10:17 AM  Participation Quality:  Active   Mood/Affect:  Appropriate  Depression Rating:   1-2  Anxiety Rating:  4  Thoughts of Suicide:  No Will you contract for safety?   Yes  Current AVH:  No  Plan for Discharge/Comments:  Patient reports her desire to no longer attend [facility] and to go home with no outpatient follow up.   Transportation Means: Unknown   Supports: Friends   PICKETT JR, [place] C

## 2013-10-09 NOTE — Progress Notes (Signed)
Adult Psychoeducational Group Note  Date:  10/09/2013 Time:  11:19 AM  Group Topic/Focus:  Dimensions of Wellness:   The focus of this group is to introduce the topic of wellness and discuss the role each dimension of wellness plays in total health.  Participation Level:  Active  Participation Quality:  Appropriate and Attentive  Affect:  Appropriate, brighter  Cognitive:  Alert and Appropriate  Insight: Appropriate and Good  Engagement in Group:  Engaged, Improving and Supportive  Modes of Intervention:  Discussion  Additional Comments:  Pt's participation good, and improved from last week. As a part of her wellness she plans to save money under financial goals and exercise under physical goals.   Gunnar Bulla 10/09/2013, 11:19 AM

## 2013-10-09 NOTE — BHH Group Notes (Signed)
[facility] LCSW Group Therapy  10/09/2013 2:55 PM  Type of Therapy:  Group Therapy  Participation Level:  Did Not Attend-Patient entered group for 15 minutes then left.     [place] Cage, GREGORY C 10/09/2013, 2:55 PM

## 2013-10-10 MED ORDER — NICOTINE 21 MG/24HR TD PT24: 21.0000 mg | MEDICATED_PATCH | Freq: Every day | TRANSDERMAL | Status: DC

## 2013-10-10 MED ORDER — IBUPROFEN 200 MG PO TABS: 400.0000 mg | ORAL_TABLET | Freq: Four times a day (QID) | ORAL | Status: DC | PRN

## 2013-10-10 MED ORDER — FLUOXETINE HCL 20 MG PO CAPS: 20.0000 mg | ORAL_CAPSULE | Freq: Every day | ORAL | Status: DC

## 2013-10-10 MED ORDER — HYDROXYZINE HCL 25 MG PO TABS: ORAL_TABLET | ORAL | Status: DC

## 2013-10-10 MED ORDER — HYDROXYZINE HCL 25 MG PO TABS
25.0000 mg | ORAL_TABLET | Freq: Three times a day (TID) | ORAL | Status: DC | PRN
  Filled 2013-10-10: qty 9

## 2013-10-10 MED ORDER — GABAPENTIN 300 MG PO CAPS: 300.0000 mg | ORAL_CAPSULE | Freq: Three times a day (TID) | ORAL | Status: DC

## 2013-10-10 MED ORDER — TRAZODONE HCL 50 MG PO TABS: 50.0000 mg | ORAL_TABLET | Freq: Every evening | ORAL | Status: DC | PRN

## 2013-10-10 NOTE — Progress Notes (Signed)
[facility] Adult Case Management Discharge Plan :  Will you be returning to the same living situation after discharge: Yes,  home At discharge, do you have transportation home?:Yes,  friend/family member Do you have the ability to pay for your medications:Yes,  Private insurance  Release of information consent forms completed and submitted to medical records by CSW.  Patient to Follow up at: Follow-up Information   Follow up with Patient refused all referrals. .      Patient denies SI/HI:   Yes,  during group/self report.     Safety Planning and Suicide Prevention discussed:  Yes,  Pt did not endorse SI during admission or during stay at [facility] SPI pamphlet provided to pt.   Smart, Heather LCSWA  10/10/2013, 9:58 AM

## 2013-10-10 NOTE — Progress Notes (Signed)
The focus of this group is to educate the patient on the purpose and policies of crisis stabilization and provide a format to answer questions about their admission.  The group details unit policies and expectations of patients while admitted. Participated in group exercises

## 2013-10-10 NOTE — Discharge Summary (Signed)
Physician Discharge Summary Note  Patient:  [name] is an 58 y.o., female MRN:  [id number] DOB:  [birth year] Patient phone:  [phone] (home)  Patient address:   [address] [place] [address],  Total Time spent with patient: Greater than 30 minutes  Date of Admission:  10/02/2013 Date of Discharge: 10/10/13  Reason for Admission:  Alcohol detox  Discharge Diagnoses: Active Problems:   Alcohol dependence   Alcohol withdrawal   Major depression, recurrent   Psychiatric Specialty Exam: Physical Exam  Psychiatric: Her speech is normal and behavior is normal. Judgment and thought content normal. Her mood appears not anxious. Her affect is not angry, not blunt, not labile and not inappropriate. Cognition and memory are normal. She does not exhibit a depressed mood.    Review of Systems  Constitutional: Negative.   HENT: Negative.   Eyes: Negative.   Respiratory: Negative.   Cardiovascular: Negative.   Gastrointestinal: Negative.   Genitourinary: Negative.   Musculoskeletal: Negative.   Skin: Negative.   Neurological: Negative.   Endo/Heme/Allergies: Negative.   Psychiatric/Behavioral: Positive for depression (Stable) and substance abuse (Alcoholism). Negative for suicidal ideas, hallucinations and memory loss. The patient has insomnia (Stable). The patient is not nervous/anxious.     Blood pressure 131/80, pulse 82, temperature 98.3 F (36.8 C), temperature source Oral, resp. rate 16, height 5\' 5"  (1.651 m), weight 73.936 kg (163 lb), last menstrual period 12/08/2006, SpO2 98.00%.Body mass index is 27.12 kg/(m^2).   General Appearance: Fairly Groomed   Engineer, water:: Fair   Speech: Clear and Coherent   Volume: Normal   Mood: Euthymic   Affect: Appropriate   Thought Process: Coherent and Goal Directed   Orientation: Full (Time, Place, and Person)   Thought Content: plans as she moves on, relpse prevention plan   Suicidal Thoughts: No   Homicidal  Thoughts: No   Memory: Immediate; Fair  Recent; Fair  Remote; Fair   Judgement: Fair   Insight: Present   Psychomotor Activity: Normal   Concentration: Fair   Recall: [employer] of Knowledge:NA   Language: Fair   Akathisia: No   Handed: Right   AIMS (if indicated):   Assets: Desire for Improvement  Housing  Talents/Skills  Transportation   Sleep: Number of Hours: 6.75    Past Psychiatric History: Diagnosis: Alcohol dependence, Major depression, recurrent  Hospitalizations: [facility]  Outpatient Care: AA meetings  Substance Abuse Care: AA meetings  Self-Mutilation: NA  Suicidal Attempts: NA  Violent Behaviors: NA   Musculoskeletal: Strength & Muscle Tone: within normal limits Gait & Station: normal Patient leans: N/A  DSM5: Schizophrenia Disorders: NA Obsessive-Compulsive Disorders:  NA Trauma-Stressor Disorders:  NA Substance/Addictive Disorders:  Alcohol Related Disorder - Severe (303.90) Depressive Disorders:  Major depression, recurrent  Axis Diagnosis:  AXIS I:  Alcohol dependence, Major depression, recurrent AXIS II:  Deferred AXIS III:   Past Medical History  Diagnosis Date  . GERD (gastroesophageal reflux disease)   . Rosacea   . Elevated liver function tests   . Wears glasses   . Chronic diarrhea   . Alcohol abuse     H/o withdrawal   . PONV (postoperative nausea and vomiting)   . Pancreatitis    AXIS IV:  other psychosocial or environmental problems and Alcoholism, chronic AXIS V:  62  Level of Care:  OP  Hospital Course: 58 Y/O female who admits she has been drinking too much as much "as she can" beer and wine (12  pack , 2 ( 1.5 L of wine easily). Drinking every day for almost year and a half. She was abstinent for couple of months after she had surgery for her pancreatitis. States she was placed on Opioids and Benzodiazepines. When they did not refill them anymore it triggered the relapse. Was "straight for 15 years" before that Past  history of cocaine, and "all kind" of drugs. States she has lost good jobs because of her alcohol use.   [name] was admitted to the hospital with a blood alcohol level of 201 per toxicology reports. She was reporting long history of alcoholism/other drugs, including 15 year sobriety. However, relapsed after a brief treatment for pancreatitis with an opioid and Benzodiazepine. On her admission day, [name] was intoxicated, presenting with alcohol withdrawal symptoms, requiring alcohol detoxification treatment. She received Librium detox regimen. She was also enrolled in the group counseling sessions and AA/NA meetings being offered and held on this unit. [name] learned coping skills.  Besides the detox treatment, she was also ordered, received and discharged on Fluoxetine 20 mg daily for depression, Hydroxyzine 25 mg three times daily as needed for anxiety/tension, Nicotine patch for nicotine addiction, Gabapentin 300 mg three times daily for substance withdrawal syndrome and Trazodone 50 mg Q bedtime for sleep. [name] received a brief Lorazepam 1 mg treatment on a prn basis for high anxiety levels. She tolerated her treatment regimen without any significant adverse effects and or reactions.  [name] has completed detox treatment and her mood is stable. This is evidenced by her reports of improved mood and absence of substance withdrawal symptoms. She is currently being discharged to her home. She refused or rather declines to have a follow-up appointment/referral made for medication management, continuation of substance abuse treatment and routine psychiatric care. She states that she will rather attend weekly AA meetings. Upon discharge, she adamantly denies any SIHI, AVH, delusional thoughts, paranoia and or withdrawal symptoms. She was provided with a 4 days worth, supply samples of her [facility] discharge medications. She left [facility] with all personal belongings in no apparent distress. Transportation per  [employer].  Consults:  psychiatry  Significant Diagnostic Studies:  labs: CBC with diff, CMP, UDS, toxicology tests, U/A  Discharge Vitals:   Blood pressure 131/80, pulse 82, temperature 98.3 F (36.8 C), temperature source Oral, resp. rate 16, height 5\' 5"  (1.651 m), weight 73.936 kg (163 lb), last menstrual period 12/08/2006, SpO2 98.00%. Body mass index is 27.12 kg/(m^2). Lab Results:   No results found for this or any previous visit (from the past 72 hour(s)).  Physical Findings: AIMS: Facial and Oral Movements Muscles of Facial Expression: None, normal Lips and Perioral Area: None, normal Jaw: None, normal Tongue: None, normal,Extremity Movements Upper (arms, wrists, hands, fingers): None, normal Lower (legs, knees, ankles, toes): None, normal, Trunk Movements Neck, shoulders, hips: None, normal, Overall Severity Severity of abnormal movements (highest score from questions above): None, normal Incapacitation due to abnormal movements: None, normal Patient's awareness of abnormal movements (rate only patient's report): No Awareness, Dental Status Current problems with teeth and/or dentures?: No Does patient usually wear dentures?: No  CIWA:  CIWA-Ar Total: 3 COWS:     Psychiatric Specialty Exam: See Psychiatric Specialty Exam and Suicide Risk Assessment completed by Attending Physician prior to discharge.  Discharge destination:  Home  Is patient on multiple antipsychotic therapies at discharge:  No   Has Patient had three or more failed trials of antipsychotic monotherapy by history:  No  Recommended Plan for Multiple Antipsychotic  Therapies: NA    Medication List       Indication   FLUoxetine 20 MG capsule  Commonly known as:  PROZAC  Take 1 capsule (20 mg total) by mouth daily. For depression   Indication:  Depression, Major Depressive Disorder     gabapentin 300 MG capsule  Commonly known as:  NEURONTIN  Take 1 capsule (300 mg total) by mouth 3 (three)  times daily. For substance withdrawal syndrome   Indication:  Pain, Substance withdrawal syndrome     hydrOXYzine 25 MG tablet  Commonly known as:  ATARAX/VISTARIL  Take 1 tablet (25 mg) three times daily as needed: For anxiety   Indication:  Tension, Anxiety     ibuprofen 200 MG tablet  Commonly known as:  ADVIL,MOTRIN  Take 2 tablets (400 mg total) by mouth every 6 (six) hours as needed for moderate pain (for pain.).   Indication:  Mild to Moderate Pain     nicotine 21 mg/24hr patch  Commonly known as:  NICODERM CQ - dosed in mg/24 hours  Place 1 patch (21 mg total) onto the skin daily. For nicotine addiction   Indication:  Nicotine Addiction     traZODone 50 MG tablet  Commonly known as:  DESYREL  Take 1 tablet (50 mg total) by mouth at bedtime as needed for sleep (may repeat X 1). For sleep   Indication:  Trouble Sleeping       Follow-up Information   Follow up with Patient refused all referrals. .     Follow-up recommendations: Activity:  As tolerated Diet: As recommended by your primary care doctor. Keep all scheduled follow-up appointments as recommended.   Comments:  Take all your medications as prescribed by your mental healthcare provider. Report any adverse effects and or reactions from your medicines to your outpatient provider promptly. Patient is instructed and cautioned to not engage in alcohol and or illegal drug use while on prescription medicines. In the event of worsening symptoms, patient is instructed to call the crisis hotline, 911 and or go to the nearest ED for appropriate evaluation and treatment of symptoms. Follow-up with your primary care provider for your other medical issues, concerns and or health care needs.   Total Discharge Time:  Greater than 30 minutes.  Signed: Encarnacion Slates, PMHNP-BC 10/10/2013, 12:07 PM I prsonally assessed the patient and formulated the plan Geralyn Flash A. Sabra Heck, M.D.

## 2013-10-10 NOTE — Tx Team (Signed)
Interdisciplinary Treatment Plan Update (Adult)  Date: 10/10/2013  Time Reviewed: 9:56 AM   Progress in Treatment: Attending groups: Yes Participating in groups:  Yes Taking medication as prescribed:  Yes Tolerating medication:  Yes Family/Significant othe contact made: No, N/A Patient understands diagnosis:  Yes Discussing patient identified problems/goals with staff:  Yes Medical problems stabilized or resolved:  Yes Denies suicidal/homicidal ideation: Yes Issues/concerns per patient self-inventory:  Yes Other:  New problem(s) identified: N/A  Discharge Plan or Barriers: [facility]. PT stated that she does not want followup of any kind and plans to return home and go to AA.   Reason for Continuation of Hospitalization: d/c today  Comments: N/A  Estimated length of stay: d/c today  For review of initial/current patient goals, please see plan of care.  Attendees: Patient:     Family:     Physician:  Dr.Lugo MD  10/10/2013 9:57 AM   Nursing:      Clinical Social Worker:  Maxie Better, [place]  10/10/2013 9:57 AM                  Other:    Other:    Other:    Other:    Other:     Scribe for Treatment Team:   Ane Payment, 10/06/2013 , 8:13 AM

## 2013-10-10 NOTE — Progress Notes (Signed)
Patient ID: [name], female   DOB: [birth year], 58 y.o.   MRN: [id number] Patient is discharged ambulatory to ride home with her fiance.  She denies SI/HI.  She verbalizes understanding of her discharge meds and followup which is with AA. Marland Kitchen  She was given a 3 day supply of meds and scripts by MD.  She plans to return to work and expressed gratitude for care received.

## 2013-10-10 NOTE — BHH Suicide Risk Assessment (Signed)
Suicide Risk Assessment  Discharge Assessment     Demographic Factors:  Caucasian  Total Time spent with patient: 45 minutes  Psychiatric Specialty Exam:     Blood pressure 131/80, pulse 82, temperature 98.3 F (36.8 C), temperature source Oral, resp. rate 16, height 5\' 5"  (1.651 m), weight 73.936 kg (163 lb), last menstrual period 12/08/2006, SpO2 98.00%.Body mass index is 27.12 kg/(m^2).  General Appearance: Fairly Groomed  Engineer, water::  Fair  Speech:  Clear and Coherent  Volume:  Normal  Mood:  Euthymic  Affect:  Appropriate  Thought Process:  Coherent and Goal Directed  Orientation:  Full (Time, Place, and Person)  Thought Content:  plans as she moves on, relpse prevention plan  Suicidal Thoughts:  No  Homicidal Thoughts:  No  Memory:  Immediate;   Fair Recent;   Fair Remote;   Fair  Judgement:  Fair  Insight:  Present  Psychomotor Activity:  Normal  Concentration:  Fair  Recall:  [employer] of Knowledge:NA  Language: Fair  Akathisia:  No  Handed:  Right  AIMS (if indicated):     Assets:  Desire for Improvement Housing Talents/Skills Transportation  Sleep:  Number of Hours: 6.75    Musculoskeletal: Strength & Muscle Tone: within normal limits Gait & Station: normal Patient leans: N/A   Mental Status Per Nursing Assessment::   On Admission:  NA  Current Mental Status by Physician: No active S/S of withdrawal, no SI plans or intent, she is willing and motivated to pursue outpatient treatment, and long term sobriety   Loss Factors: NA  Historical Factors: NA  Risk Reduction Factors:   Sense of responsibility to family, Employed and Positive social support  Continued Clinical Symptoms:  Depression:   Comorbid alcohol abuse/dependence Alcohol/Substance Abuse/Dependencies  Cognitive Features That Contribute To Risk:  Closed-mindedness Polarized thinking Thought constriction (tunnel vision)    Suicide Risk:  Minimal: No identifiable suicidal  ideation.  Patients presenting with no risk factors but with morbid ruminations; may be classified as minimal risk based on the severity of the depressive symptoms  Discharge Diagnoses:   AXIS I:  Alcohol Dependence, S/P alcohol withdrawal, Major depression recurrent AXIS II:  No diagnosis AXIS III:   Past Medical History  Diagnosis Date  . GERD (gastroesophageal reflux disease)   . Rosacea   . Elevated liver function tests   . Wears glasses   . Chronic diarrhea   . Alcohol abuse     H/o withdrawal   . PONV (postoperative nausea and vomiting)   . Pancreatitis    AXIS IV:  other psychosocial or environmental problems AXIS V:  61-70 mild symptoms  Plan Of Care/Follow-up recommendations:  Activity:  as tolerated Diet:  regular Follow up outpatient basis/AA Is patient on multiple antipsychotic therapies at discharge:  No   Has Patient had three or more failed trials of antipsychotic monotherapy by history:  No  Recommended Plan for Multiple Antipsychotic Therapies: NA    Everette Dimauro A 10/10/2013, 9:38 AM

## 2013-10-10 NOTE — Progress Notes (Signed)
   Pt laying in bed resting with eyes closed. Respirations even and unlabored. No distress noted. 15 min checks continued for safety.

## 2013-10-12 NOTE — Progress Notes (Signed)
Patient Discharge Instructions:  No documentation was faxed for [facility]IPS. Per the sw the patient refused follow up.  Patsey Berthold, 10/12/2013, 1:33 PM

## 2014-02-22 ENCOUNTER — Other Ambulatory Visit: Payer: Self-pay | Admitting: Obstetrics and Gynecology

## 2014-02-26 LAB — CYTOLOGY - PAP

## 2014-03-06 ENCOUNTER — Emergency Department (HOSPITAL_COMMUNITY)
Admission: EM | Admit: 2014-03-06 | Discharge: 2014-03-06 | Disposition: A | Discharge status: Home / Self Care | Payer: Managed Care, Other (non HMO) | Source: Home / Self Care | Attending: Family Medicine | Admitting: Family Medicine

## 2014-03-06 ENCOUNTER — Encounter (HOSPITAL_COMMUNITY): Payer: Self-pay | Admitting: Family Medicine

## 2014-03-06 DIAGNOSIS — J9801 Acute bronchospasm: Secondary | ICD-10-CM

## 2014-03-06 DIAGNOSIS — J0140 Acute pansinusitis, unspecified: Secondary | ICD-10-CM

## 2014-03-06 MED ORDER — PREDNISONE 50 MG PO TABS: ORAL_TABLET | ORAL | Status: DC

## 2014-03-06 MED ORDER — FLUCONAZOLE 150 MG PO TABS: 150.0000 mg | ORAL_TABLET | Freq: Every day | ORAL | Status: DC

## 2014-03-06 MED ORDER — HYDROCOD POLST-CHLORPHEN POLST 10-8 MG/5ML PO LQCR: 2.5000 mL | Freq: Every evening | ORAL | Status: DC | PRN

## 2014-03-06 MED ORDER — IPRATROPIUM BROMIDE 0.06 % NA SOLN: 2.0000 | Freq: Four times a day (QID) | NASAL | Status: DC

## 2014-03-06 MED ORDER — ALBUTEROL SULFATE HFA 108 (90 BASE) MCG/ACT IN AERS: 2.0000 | INHALATION_SPRAY | Freq: Four times a day (QID) | RESPIRATORY_TRACT | Status: DC | PRN

## 2014-03-06 MED ORDER — AMOXICILLIN-POT CLAVULANATE 875-125 MG PO TABS: 1.0000 | ORAL_TABLET | Freq: Two times a day (BID) | ORAL | Status: DC

## 2014-03-06 NOTE — ED Notes (Signed)
C/o sinus and chest congestion, yellow phlegm .  Blowing yellow from nose. Initially had a sore throat.  Currently face and teeth hurt.  Denies ear pain.

## 2014-03-06 NOTE — Discharge Instructions (Signed)
You have a sinus infection adn bronchospasm Please take the prednisone to help with the spasm Please take the augmentin for the infection Please use the nasal saline and then nasal atrovent.  Use motrin 600 as needed for pain and fever Please consider using the albuterol if needed Start the diflucan if you develop a yeast infection

## 2014-03-06 NOTE — ED Provider Notes (Signed)
CSN: [id number]     Arrival date & time 03/06/14  1227 History   First MD Initiated Contact with Patient 03/06/14 1334     Chief Complaint  Patient presents with  . URI   (Consider location/radiation/quality/duration/timing/severity/associated sxs/prior Treatment) HPI  Sore throat: started Saturday. Resolved byt he following day. Associated w/ congestion, HA, fever. Tried several OTC cold adn congestion medications w/o benefit. Getting worse, now w/ body aches and productive cough. Tolerating PO.     Past Medical History  Diagnosis Date  . GERD (gastroesophageal reflux disease)   . Rosacea   . Elevated liver function tests   . Wears glasses   . Chronic diarrhea   . Alcohol abuse     H/o withdrawal   . PONV (postoperative nausea and vomiting)   . Pancreatitis    Past Surgical History  Procedure Laterality Date  . Cholecystectomy    . Shoulder surgery  [id number]  . Diagnostic mammogram  2008  . Tubal ligation    . Colonoscopy  Never  . Esophagogastroduodenoscopy  03/21/2011    Procedure: ESOPHAGOGASTRODUODENOSCOPY (EGD);  Surgeon: Scarlette Shorts, MD;  Location: [facility] ENDOSCOPY;  Service: Endoscopy;  Laterality: N/A;  Patient may need to be done at bedside tomorrow depending on status  . Ercp  10/06/2011    Procedure: ENDOSCOPIC RETROGRADE CHOLANGIOPANCREATOGRAPHY (ERCP);  Surgeon: Beryle Beams, MD;  Location: [facility] ENDOSCOPY;  Service: Endoscopy;  Laterality: N/A;  . Laparotomy  01/03/2012    Procedure: EXPLORATORY LAPAROTOMY;  Surgeon: Pedro Earls, MD;  Location: [facility] ORS;  Service: General;  Laterality: N/A;  debridement of necrotic pancreas and placement of drains x2  . Esophagogastroduodenoscopy  01/06/2012    Procedure: ESOPHAGOGASTRODUODENOSCOPY (EGD);  Surgeon: Wonda Horner, MD;  Location: [facility] ENDOSCOPY;  Service: Endoscopy;  Laterality: N/A;  bedside   Family History  Problem Relation Age of Onset  . Stroke Mother   . Heart disease Father   . Heart failure Father   . Heart  disease Sister    History  Substance Use Topics  . Smoking status: Former Smoker -- 1.00 packs/day for 15 years    Quit date: 09/22/1978  . Smokeless tobacco: Never Used  . Alcohol Use: 1.5 - 2.0 oz/week    3-4 Not specified per week     Comment: gets dt's - can make it up to 1 week at times   OB History    No data available     Review of Systems Per HPI with all other pertinent systems negative.   Allergies  Ciprofloxacin  Home Medications   Prior to Admission medications   Medication Sig Start Date End Date Taking? Authorizing Provider  albuterol (PROVENTIL HFA;VENTOLIN HFA) 108 (90 BASE) MCG/ACT inhaler Inhale 2 puffs into the lungs every 6 (six) hours as needed for wheezing or shortness of breath. 03/06/14   Waldemar Dickens, MD  amoxicillin-clavulanate (AUGMENTIN) 875-125 MG per tablet Take 1 tablet by mouth 2 (two) times daily. 03/06/14   Waldemar Dickens, MD  fluconazole (DIFLUCAN) 150 MG tablet Take 1 tablet (150 mg total) by mouth daily. Repeat dose in 3 days 03/06/14   Waldemar Dickens, MD  FLUoxetine (PROZAC) 20 MG capsule Take 1 capsule (20 mg total) by mouth daily. For depression 10/10/13   Encarnacion Slates, NP  gabapentin (NEURONTIN) 300 MG capsule Take 1 capsule (300 mg total) by mouth 3 (three) times daily. For substance withdrawal syndrome 10/10/13   Encarnacion Slates, NP  hydrOXYzine (ATARAX/VISTARIL)  25 MG tablet Take 1 tablet (25 mg) three times daily as needed: For anxiety 10/10/13   Encarnacion Slates, NP  ibuprofen (ADVIL,MOTRIN) 200 MG tablet Take 2 tablets (400 mg total) by mouth every 6 (six) hours as needed for moderate pain (for pain.). 10/10/13   Encarnacion Slates, NP  ipratropium (ATROVENT) 0.06 % nasal spray Place 2 sprays into both nostrils 4 (four) times daily. 03/06/14   Waldemar Dickens, MD  nicotine (NICODERM CQ - DOSED IN MG/24 HOURS) 21 mg/24hr patch Place 1 patch (21 mg total) onto the skin daily. For nicotine addiction 10/10/13   Encarnacion Slates, NP  predniSONE  (DELTASONE) 50 MG tablet Take first pill now, then take daily with breakfast 03/06/14   Waldemar Dickens, MD  traZODone (DESYREL) 50 MG tablet Take 1 tablet (50 mg total) by mouth at bedtime as needed for sleep (may repeat X 1). For sleep 10/10/13   Encarnacion Slates, NP   BP 157/89 mmHg  Pulse 75  Temp(Src) 100.2 F (37.9 C) (Oral)  Resp 16  SpO2 97%  LMP 12/08/2006 Physical Exam  Constitutional: She is oriented to person, place, and time. She appears well-developed and well-nourished. No distress.  HENT:  Head: Normocephalic.  Frontal and maxillary sinuses ttp. Nasal speech Pharyngeal injection w/ 0+ tonsils   Eyes: EOM are normal. Pupils are equal, round, and reactive to light.  Neck: Normal range of motion.  Cardiovascular: Normal rate, normal heart sounds and intact distal pulses.   Pulmonary/Chest: Effort normal.  R end exp wheezing. No crackles or consolidations  Abdominal: Soft.  Musculoskeletal: Normal range of motion. She exhibits no tenderness.  Neurological: She is alert and oriented to person, place, and time.  Skin: Skin is warm. No rash noted. She is not diaphoretic.  Psychiatric: She has a normal mood and affect. Her behavior is normal. Thought content normal.    ED Course  Procedures (including critical care time) Labs Review Labs Reviewed - No data to display  Imaging Review No results found.   MDM   1. Acute pansinusitis, recurrence not specified   2. Bronchospasm    Start augmentin Diflucan if develops yeast infection Start prednisone Albuterol prn Nasal saline and nasal atrovent Precautions given and all questions answered  Linna Darner, MD Family Medicine 03/06/2014, 1:58 PM      Waldemar Dickens, MD 03/06/14 [phone]

## 2014-08-01 ENCOUNTER — Emergency Department (HOSPITAL_COMMUNITY)
Admission: EM | Admit: 2014-08-01 | Discharge: 2014-08-01 | Disposition: A | Discharge status: Home / Self Care | Payer: Managed Care, Other (non HMO) | Source: Home / Self Care | Attending: Emergency Medicine | Admitting: Emergency Medicine

## 2014-08-01 ENCOUNTER — Emergency Department (INDEPENDENT_AMBULATORY_CARE_PROVIDER_SITE_OTHER): Payer: Managed Care, Other (non HMO)

## 2014-08-01 ENCOUNTER — Encounter (HOSPITAL_COMMUNITY): Payer: Self-pay | Admitting: Emergency Medicine

## 2014-08-01 DIAGNOSIS — J209 Acute bronchitis, unspecified: Secondary | ICD-10-CM

## 2014-08-01 DIAGNOSIS — R197 Diarrhea, unspecified: Secondary | ICD-10-CM

## 2014-08-01 MED ORDER — PREDNISONE 50 MG PO TABS: ORAL_TABLET | ORAL | Status: DC

## 2014-08-01 MED ORDER — AZITHROMYCIN 250 MG PO TABS: ORAL_TABLET | ORAL | Status: DC

## 2014-08-01 MED ORDER — ALBUTEROL SULFATE HFA 108 (90 BASE) MCG/ACT IN AERS: 2.0000 | INHALATION_SPRAY | Freq: Four times a day (QID) | RESPIRATORY_TRACT | Status: DC | PRN

## 2014-08-01 MED ORDER — PANCRELIPASE (LIP-PROT-AMYL) 6000-19000 UNITS PO CPEP: 1.0000 | ORAL_CAPSULE | Freq: Three times a day (TID) | ORAL | Status: DC

## 2014-08-01 NOTE — Discharge Instructions (Signed)
You have bronchitis. Take prednisone and azithromycin as prescribed. Use the albuterol inhaler as needed for wheezing.  The diarrhea may be due to pancreatic insufficiency. Try taking the Creon 3 times a day with meals. If this does not improve the diarrhea, I would recommend following up with a gastroenterologist.

## 2014-08-01 NOTE — ED Provider Notes (Signed)
CSN: [id number]     Arrival date & time 08/01/14  4268 History   First MD Initiated Contact with Patient 08/01/14 0902     Chief Complaint  Patient presents with  . Cough  . chest congestion   . Flank Pain   (Consider location/radiation/quality/duration/timing/severity/associated sxs/prior Treatment) HPI  She is a 59 year old woman here for evaluation of cough and diarrhea.  The cough has been present for about a month. In the last several weeks she has had worsening nasal and chest congestion. She also reports left side pain. She states she first noticed this about a week ago. This morning it got quite a bit worse. It is hard for her to describe the pain, but states it feels like something inside her chest. It is sometimes worse with deep breaths. She denies any fevers. No nausea or vomiting. She does occasionally feel short of breath. She heard wheezing this morning.  She also describes over a year of chronic diarrhea. She reports occasionally seeing bright red blood in it in the last month. No fevers or weight loss. She has a history of a pancreatic cyst in the setting of a complicated hospital course shortly before this started.  She is unable to see any of the gastroenterologists in town as she has a history of unpaid bills from before she had insurance.  Past Medical History  Diagnosis Date  . GERD (gastroesophageal reflux disease)   . Rosacea   . Elevated liver function tests   . Wears glasses   . Chronic diarrhea   . Alcohol abuse     H/o withdrawal   . PONV (postoperative nausea and vomiting)   . Pancreatitis    Past Surgical History  Procedure Laterality Date  . Cholecystectomy    . Shoulder surgery  [id number]  . Diagnostic mammogram  2008  . Tubal ligation    . Colonoscopy  Never  . Esophagogastroduodenoscopy  03/21/2011    Procedure: ESOPHAGOGASTRODUODENOSCOPY (EGD);  Surgeon: Scarlette Shorts, MD;  Location: [facility] ENDOSCOPY;  Service: Endoscopy;  Laterality: N/A;  Patient may  need to be done at bedside tomorrow depending on status  . Ercp  10/06/2011    Procedure: ENDOSCOPIC RETROGRADE CHOLANGIOPANCREATOGRAPHY (ERCP);  Surgeon: Beryle Beams, MD;  Location: [facility] ENDOSCOPY;  Service: Endoscopy;  Laterality: N/A;  . Laparotomy  01/03/2012    Procedure: EXPLORATORY LAPAROTOMY;  Surgeon: Pedro Earls, MD;  Location: [facility] ORS;  Service: General;  Laterality: N/A;  debridement of necrotic pancreas and placement of drains x2  . Esophagogastroduodenoscopy  01/06/2012    Procedure: ESOPHAGOGASTRODUODENOSCOPY (EGD);  Surgeon: Wonda Horner, MD;  Location: [facility] ENDOSCOPY;  Service: Endoscopy;  Laterality: N/A;  bedside   Family History  Problem Relation Age of Onset  . Stroke Mother   . Heart disease Father   . Heart failure Father   . Heart disease Sister    History  Substance Use Topics  . Smoking status: Former Smoker -- 1.00 packs/day for 15 years    Quit date: 09/22/1978  . Smokeless tobacco: Never Used  . Alcohol Use: 1.5 - 2.0 oz/week    3-4 Standard drinks or equivalent per week     Comment: gets dt's - can make it up to 1 week at times   OB History    No data available     Review of Systems  Constitutional: Negative for fever, chills and appetite change.  HENT: Positive for congestion. Negative for rhinorrhea and sore throat.   Respiratory:  Positive for cough, shortness of breath and wheezing.   Cardiovascular: Positive for chest pain.  Gastrointestinal: Positive for diarrhea (chronic) and blood in stool. Negative for nausea, vomiting and abdominal pain.  Musculoskeletal: Negative for myalgias.  Neurological: Negative for headaches.    Allergies  Ciprofloxacin  Home Medications   Prior to Admission medications   Medication Sig Start Date End Date Taking? Authorizing Provider  FLUoxetine (PROZAC) 20 MG capsule Take 1 capsule (20 mg total) by mouth daily. For depression 10/10/13  Yes Encarnacion Slates, NP  albuterol (PROVENTIL HFA;VENTOLIN HFA) 108 (90  BASE) MCG/ACT inhaler Inhale 2 puffs into the lungs every 6 (six) hours as needed for wheezing or shortness of breath. 08/01/14   Melony Overly, MD  azithromycin (ZITHROMAX Z-PAK) 250 MG tablet Take 2 pills today, then 1 pill daily until gone. 08/01/14   Melony Overly, MD  gabapentin (NEURONTIN) 300 MG capsule Take 1 capsule (300 mg total) by mouth 3 (three) times daily. For substance withdrawal syndrome 10/10/13   Encarnacion Slates, NP  hydrOXYzine (ATARAX/VISTARIL) 25 MG tablet Take 1 tablet (25 mg) three times daily as needed: For anxiety 10/10/13   Encarnacion Slates, NP  ibuprofen (ADVIL,MOTRIN) 200 MG tablet Take 2 tablets (400 mg total) by mouth every 6 (six) hours as needed for moderate pain (for pain.). 10/10/13   Encarnacion Slates, NP  nicotine (NICODERM CQ - DOSED IN MG/24 HOURS) 21 mg/24hr patch Place 1 patch (21 mg total) onto the skin daily. For nicotine addiction 10/10/13   Encarnacion Slates, NP  Pancrelipase, Lip-Prot-Amyl, 6000 UNITS CPEP Take 1 capsule (6,000 Units total) by mouth 3 (three) times daily with meals. 08/01/14   Melony Overly, MD  predniSONE (DELTASONE) 50 MG tablet Take first pill now, then take daily with breakfast 08/01/14   Melony Overly, MD  traZODone (DESYREL) 50 MG tablet Take 1 tablet (50 mg total) by mouth at bedtime as needed for sleep (may repeat X 1). For sleep 10/10/13   Encarnacion Slates, NP   BP 162/80 mmHg  Pulse 69  Temp(Src) 97.8 F (36.6 C) (Oral)  Resp 18  SpO2 95%  LMP 12/08/2006 Physical Exam  Constitutional: She is oriented to person, place, and time. She appears well-developed and well-nourished. No distress.  HENT:  Mouth/Throat: Oropharynx is clear and moist. No oropharyngeal exudate.  Neck: Neck supple.  Cardiovascular: Normal rate, regular rhythm and normal heart sounds.   No murmur heard. Pulmonary/Chest: Effort normal and breath sounds normal. No respiratory distress. She has no wheezes. She has no rales.  Neurological: She is alert and oriented to person, place,  and time.    ED Course  Procedures (including critical care time) ED ECG REPORT   Date: 08/01/2014  Rate: 64  Rhythm: normal sinus rhythm  QRS Axis: normal  Intervals: normal  ST/T Wave abnormalities: normal  Conduction Disutrbances:none  Narrative Interpretation: normal ekg  Old EKG Reviewed: unchanged  I have personally reviewed the EKG tracing and agree with the computerized printout as noted.  Labs Review Labs Reviewed - No data to display  Imaging Review Dg Chest 2 View  08/01/2014   CLINICAL DATA:  59 year old female with severe cough, head and chest congestion, wheezing and general lays. Past medical history includes bronchitis.  EXAM: CHEST  2 VIEW  COMPARISON:  Prior chest x-ray 10/01/2013  FINDINGS: Cardiac and mediastinal contours remain within normal limits. Atherosclerotic calcification noted in the transverse aorta. There is a moderately  large hiatal hernia similar compared to prior. No focal airspace consolidation, pulmonary edema, pleural effusion or pneumothorax. Stable right apical pleural parenchymal scarring. Background bronchitic change and mild interstitial prominence as well as pulmonary hyperinflation are unchanged. No acute osseous abnormality.  IMPRESSION: 1. No acute cardiopulmonary process. 2. Pulmonary hyperinflation and chronic bronchitic changes suggest underlying COPD. 3. Moderate hiatal hernia.   Electronically Signed   By: Jacqulynn Cadet M.D.   On: 08/01/2014 09:38     MDM   1. Acute bronchitis, unspecified organism   2. Diarrhea    We'll treat bronchitis with prednisone, azithromycin, albuterol. We discussed the diarrhea may be due to pancreatic insufficiency versus gut hypermotility versus inflammatory bowel.  Given her history, I think it is reasonable to do a trial of Creon. If this does not resolve her symptoms, she will likely need to see a gastroenterologist.    Melony Overly, MD 08/01/14 [phone]

## 2014-08-01 NOTE — ED Notes (Signed)
Pt has been suffering from chest/nasal congestion and a cough for two weeks.  Coughing has caused a left flank pain below her ribs, that hurts when she takes a deep breath or coughs.  Pt denies any fever.

## 2014-08-16 ENCOUNTER — Emergency Department (HOSPITAL_COMMUNITY)
Admission: EM | Admit: 2014-08-16 | Discharge: 2014-08-17 | Disposition: A | Discharge status: Home / Self Care | Payer: Managed Care, Other (non HMO) | Attending: Emergency Medicine | Admitting: Emergency Medicine

## 2014-08-16 ENCOUNTER — Encounter (HOSPITAL_COMMUNITY): Payer: Self-pay | Admitting: Nurse Practitioner

## 2014-08-16 DIAGNOSIS — R45851 Suicidal ideations: Secondary | ICD-10-CM | POA: Diagnosis present

## 2014-08-16 DIAGNOSIS — Z79899 Other long term (current) drug therapy: Secondary | ICD-10-CM | POA: Diagnosis not present

## 2014-08-16 DIAGNOSIS — F329 Major depressive disorder, single episode, unspecified: Secondary | ICD-10-CM | POA: Diagnosis not present

## 2014-08-16 DIAGNOSIS — Y998 Other external cause status: Secondary | ICD-10-CM | POA: Diagnosis not present

## 2014-08-16 DIAGNOSIS — Z8507 Personal history of malignant neoplasm of pancreas: Secondary | ICD-10-CM | POA: Insufficient documentation

## 2014-08-16 DIAGNOSIS — Y9389 Activity, other specified: Secondary | ICD-10-CM | POA: Insufficient documentation

## 2014-08-16 DIAGNOSIS — Z872 Personal history of diseases of the skin and subcutaneous tissue: Secondary | ICD-10-CM | POA: Insufficient documentation

## 2014-08-16 DIAGNOSIS — K859 Acute pancreatitis, unspecified: Secondary | ICD-10-CM | POA: Diagnosis not present

## 2014-08-16 DIAGNOSIS — X788XXA Intentional self-harm by other sharp object, initial encounter: Secondary | ICD-10-CM | POA: Diagnosis not present

## 2014-08-16 DIAGNOSIS — S61512A Laceration without foreign body of left wrist, initial encounter: Secondary | ICD-10-CM

## 2014-08-16 DIAGNOSIS — Y9289 Other specified places as the place of occurrence of the external cause: Secondary | ICD-10-CM | POA: Diagnosis not present

## 2014-08-16 DIAGNOSIS — F1020 Alcohol dependence, uncomplicated: Secondary | ICD-10-CM | POA: Diagnosis not present

## 2014-08-16 DIAGNOSIS — F1010 Alcohol abuse, uncomplicated: Secondary | ICD-10-CM | POA: Diagnosis not present

## 2014-08-16 DIAGNOSIS — Z87891 Personal history of nicotine dependence: Secondary | ICD-10-CM | POA: Diagnosis not present

## 2014-08-16 DIAGNOSIS — R4689 Other symptoms and signs involving appearance and behavior: Secondary | ICD-10-CM

## 2014-08-16 DIAGNOSIS — R4589 Other symptoms and signs involving emotional state: Secondary | ICD-10-CM

## 2014-08-16 LAB — BASIC METABOLIC PANEL
Anion gap: 10 (ref 5–15)
BUN: 13 mg/dL (ref 6–23)
CALCIUM: 8.6 mg/dL (ref 8.4–10.5)
CO2: 20 mmol/L (ref 19–32)
Chloride: 108 mmol/L (ref 96–112)
Creatinine, Ser: 0.56 mg/dL (ref 0.50–1.10)
Glucose, Bld: 86 mg/dL (ref 70–99)
Potassium: 3.9 mmol/L (ref 3.5–5.1)
SODIUM: 138 mmol/L (ref 135–145)

## 2014-08-16 LAB — CBC WITH DIFFERENTIAL/PLATELET
BASOS PCT: 0 % (ref 0–1)
Basophils Absolute: 0 10*3/uL (ref 0.0–0.1)
EOS ABS: 0.2 10*3/uL (ref 0.0–0.7)
EOS PCT: 2 % (ref 0–5)
HCT: 42.5 % (ref 36.0–46.0)
Hemoglobin: 14.2 g/dL (ref 12.0–15.0)
Lymphocytes Relative: 25 % (ref 12–46)
Lymphs Abs: 2.7 10*3/uL (ref 0.7–4.0)
MCH: 32.8 pg (ref 26.0–34.0)
MCHC: 33.4 g/dL (ref 30.0–36.0)
MCV: 98.2 fL (ref 78.0–100.0)
Monocytes Absolute: 0.8 10*3/uL (ref 0.1–1.0)
Monocytes Relative: 7 % (ref 3–12)
Neutro Abs: 7.2 10*3/uL (ref 1.7–7.7)
Neutrophils Relative %: 66 % (ref 43–77)
PLATELETS: 235 10*3/uL (ref 150–400)
RBC: 4.33 MIL/uL (ref 3.87–5.11)
RDW: 12.9 % (ref 11.5–15.5)
WBC: 10.9 10*3/uL — ABNORMAL HIGH (ref 4.0–10.5)

## 2014-08-16 LAB — RAPID URINE DRUG SCREEN, HOSP PERFORMED
AMPHETAMINES: NOT DETECTED
BENZODIAZEPINES: POSITIVE — AB
Barbiturates: NOT DETECTED
COCAINE: NOT DETECTED
OPIATES: NOT DETECTED
Tetrahydrocannabinol: POSITIVE — AB

## 2014-08-16 LAB — ACETAMINOPHEN LEVEL: Acetaminophen (Tylenol), Serum: <10 ug/mL — ABNORMAL LOW (ref 10–30)

## 2014-08-16 LAB — SALICYLATE LEVEL

## 2014-08-16 LAB — ETHANOL: Alcohol, Ethyl (B): 208 mg/dL — ABNORMAL HIGH (ref 0–9)

## 2014-08-16 MED ORDER — GABAPENTIN 300 MG PO CAPS
300.0000 mg | ORAL_CAPSULE | Freq: Three times a day (TID) | ORAL | Status: DC
  Administered 2014-08-16 – 2014-08-17 (×2): 300 mg via ORAL
  Filled 2014-08-16 (×2): qty 1

## 2014-08-16 MED ORDER — LIDOCAINE-EPINEPHRINE (PF) 2 %-1:200000 IJ SOLN
10.0000 mL | Freq: Once | INTRAMUSCULAR | Status: AC
  Administered 2014-08-16: 10 mL
  Filled 2014-08-16: qty 10

## 2014-08-16 MED ORDER — FLUOXETINE HCL 20 MG PO CAPS
20.0000 mg | ORAL_CAPSULE | Freq: Every day | ORAL | Status: DC
  Administered 2014-08-17: 20 mg via ORAL
  Filled 2014-08-16: qty 1

## 2014-08-16 MED ORDER — ALBUTEROL SULFATE HFA 108 (90 BASE) MCG/ACT IN AERS: 2.0000 | INHALATION_SPRAY | Freq: Four times a day (QID) | RESPIRATORY_TRACT | Status: DC | PRN

## 2014-08-16 MED ORDER — NICOTINE 21 MG/24HR TD PT24
21.0000 mg | MEDICATED_PATCH | Freq: Every day | TRANSDERMAL | Status: DC
  Administered 2014-08-17: 21 mg via TRANSDERMAL
  Filled 2014-08-16: qty 1

## 2014-08-16 MED ORDER — PANCRELIPASE (LIP-PROT-AMYL) 12000-38000 UNITS PO CPEP
6000.0000 [IU] | ORAL_CAPSULE | Freq: Three times a day (TID) | ORAL | Status: DC
  Administered 2014-08-17 (×2): 12000 [IU] via ORAL
  Filled 2014-08-16 (×4): qty 1

## 2014-08-16 MED ORDER — TRAZODONE HCL 50 MG PO TABS: 50.0000 mg | ORAL_TABLET | Freq: Every evening | ORAL | Status: DC | PRN

## 2014-08-16 NOTE — BH Assessment (Addendum)
Tele Assessment Note   [name] is an 59 y.o. female.  -Clinician reviewed note by Dr. Maryan Rued.  Pt had been drinking and had made cuts to her left wrist.  She told staff when she came in that she had intended to kill herself.  Patient had made cuts to left wrist, a neighbor saw that she was outside in the back yard and was bleeding.  EMS was contacted.  IVC was initiated when patient was talking about wanting to leave to go home.  Initial BAL was 208 at 20:23.  Patient had also been talking about being diagnosed with pancreatic cancer and only having less than 6 months to live.  There is nothing in her chart in [facility] to indicate she has been diagnosed with anything other than pancreatitis.  Patient was having a cast put on her left arm when clinician talked with her.  Patient denies that this was an intentional cutting to try to end her life.  Instead, patient says that "it was an accident."  Patient admits to having been hospitalized at [facility] in June '15.  Patient is currently denying that she wants to kill herself.  Patient denies any HI or A/V hallucinations.  Patient has no current outpatient services.  She is prescribed Lexapro by her pcp but she says she does not take it regularly. Patient uses alcohol on the weekends she says.  She says she can make a 6 pack last the weekend.  Admits to drinking a whole bottle of wine tonight.  Patient says that she smoke about 2 bowls of marijuana per week.    Pt was surprised to be told that she would need to stay the night.  She said that she had a friend coming over to her home this evening.  Patient did not seem to realize the gravity of her actions.  -Clinician talked with Arlester Marker, NP about patient.  She recommended that since patient had a cast, she remain in the [facility] and be seen in AM by psychiatry team.  Axis I: Alcohol Abuse and Major Depression, single episode Axis II: Deferred Axis III:  Past Medical History  Diagnosis Date  . GERD  (gastroesophageal reflux disease)   . Rosacea   . Elevated liver function tests   . Wears glasses   . Chronic diarrhea   . Alcohol abuse     H/o withdrawal   . PONV (postoperative nausea and vomiting)   . Pancreatitis    Axis IV: other psychosocial or environmental problems and problems with primary support group Axis V: 31-40 impairment in reality testing  Past Medical History:  Past Medical History  Diagnosis Date  . GERD (gastroesophageal reflux disease)   . Rosacea   . Elevated liver function tests   . Wears glasses   . Chronic diarrhea   . Alcohol abuse     H/o withdrawal   . PONV (postoperative nausea and vomiting)   . Pancreatitis     Past Surgical History  Procedure Laterality Date  . Cholecystectomy    . Shoulder surgery  [id number]  . Diagnostic mammogram  2008  . Tubal ligation    . Colonoscopy  Never  . Esophagogastroduodenoscopy  03/21/2011    Procedure: ESOPHAGOGASTRODUODENOSCOPY (EGD);  Surgeon: Scarlette Shorts, MD;  Location: [facility] ENDOSCOPY;  Service: Endoscopy;  Laterality: N/A;  Patient may need to be done at bedside tomorrow depending on status  . Ercp  10/06/2011    Procedure: ENDOSCOPIC RETROGRADE CHOLANGIOPANCREATOGRAPHY (ERCP);  Surgeon: Saralyn Pilar  Renee Ramus, MD;  Location: [facility] ENDOSCOPY;  Service: Endoscopy;  Laterality: N/A;  . Laparotomy  01/03/2012    Procedure: EXPLORATORY LAPAROTOMY;  Surgeon: Pedro Earls, MD;  Location: [facility] ORS;  Service: General;  Laterality: N/A;  debridement of necrotic pancreas and placement of drains x2  . Esophagogastroduodenoscopy  01/06/2012    Procedure: ESOPHAGOGASTRODUODENOSCOPY (EGD);  Surgeon: Wonda Horner, MD;  Location: [facility] ENDOSCOPY;  Service: Endoscopy;  Laterality: N/A;  bedside    Family History:  Family History  Problem Relation Age of Onset  . Stroke Mother   . Heart disease Father   . Heart failure Father   . Heart disease Sister     Social History:  reports that she quit smoking about 35 years ago. She has  never used smokeless tobacco. She reports that she drinks about 1.5 - 2.0 oz of alcohol per week. She reports that she does not use illicit drugs.  Additional Social History:  Alcohol / Drug Use Pain Medications: N/A Prescriptions: Lexapro '20mg'$ .  Pt says she does not take it regularly. Over the Counter: Calcium, Biotin, Vitamin D History of alcohol / drug use?: Yes Substance #1 Name of Substance 1: ETOH (usually beer or wine) 1 - Age of First Use: 59 years of age 62 - Amount (size/oz): 3 beers per day twice weekly 1 - Frequency: On weekends a six pack 1 - Duration: On-going 1 - Last Use / Amount: 04/28 "I probably drank a bottle of wine." Substance #2 Name of Substance 2: Marijuana 2 - Age of First Use: 59 years of age 5 - Amount (size/oz): "one little bowl about twice weekly." 2 - Frequency: Twice weekly 2 - Duration: On-going 2 - Last Use / Amount: 04/28  CIWA: CIWA-Ar BP: 129/73 mmHg Pulse Rate: 86 COWS:    PATIENT STRENGTHS: (choose at least two) Average or above average intelligence Capable of independent living Communication skills Work skills  Allergies:  Allergies  Allergen Reactions  . Ciprofloxacin Nausea Only    Home Medications:  (Not in a hospital admission)  OB/GYN Status:  Patient's last menstrual period was 12/08/2006.  General Assessment Data Location of Assessment: [facility] Is this a Tele or Face-to-Face Assessment?: Face-to-Face Is this an Initial Assessment or a Re-assessment for this encounter?: Initial Assessment Living Arrangements: Alone Can pt return to current living arrangement?: Yes Admission Status: Involuntary Is patient capable of signing voluntary admission?: No Transfer from: [facility] Referral Source: Self/Family/Friend     [place] Living Arrangements: Alone Name of Psychiatrist: None Name of Therapist: None  Education Status Highest grade of school patient has completed: 4 year degree  Risk to self with  the past 6 months Suicidal Ideation: Yes-Currently Present Suicidal Intent: Yes-Currently Present Is patient at risk for suicide?: Yes Suicidal Plan?: Yes-Currently Present Specify Current Suicidal Plan: Plan was to cut herself Access to Means: Yes Specify Access to Suicidal Means: [place] What has been your use of drugs/alcohol within the last 12 months?: ETOH & THC use regularly Previous Attempts/Gestures: Yes How many times?: 1 Other Self Harm Risks: None Triggers for Past Attempts: Unknown Intentional Self Injurious Behavior: None Family Suicide History: No Recent stressful life event(s): Recent negative physical changes ("I have that pancreatitis.") Persecutory voices/beliefs?: No Depression: Yes Depression Symptoms: Despondent, Isolating, Loss of interest in usual pleasures, Feeling worthless/self pity Substance abuse history and/or treatment for substance abuse?: Yes Suicide prevention information given to non-admitted patients: Not applicable  Risk to Others within the  past 6 months Homicidal Ideation: No Thoughts of Harm to Others: No Current Homicidal Intent: No Current Homicidal Plan: No Access to Homicidal Means: No Identified Victim: No one History of harm to others?: No Assessment of Violence: None Noted Violent Behavior Description: Pt denies Does patient have access to weapons?: No Criminal Charges Pending?: No Does patient have a court date: No  Psychosis Hallucinations: None noted Delusions: None noted  Mental Status Report Appearance/Hygiene: Disheveled, In scrubs Eye Contact: Good Motor Activity: Freedom of movement, Unremarkable Speech: Logical/coherent, Soft Level of Consciousness: Quiet/awake, Drowsy Mood: Depressed, Despair, Helpless, Sad Affect: Depressed, Sad Anxiety Level: Moderate Thought Processes: Coherent, Relevant Judgement: Impaired Orientation: Person, Place, Situation Obsessive Compulsive Thoughts/Behaviors: None  Cognitive  Functioning Concentration: Normal Memory: Recent Impaired, Remote Intact IQ: Average Insight: Fair Impulse Control: Poor Appetite: Good Weight Loss: 0 Weight Gain: 0 Sleep: Decreased Total Hours of Sleep:  (Up & down to bathroom at night.) Vegetative Symptoms: None  ADLScreening [facility] Assessment Services) Patient's cognitive ability adequate to safely complete daily activities?: Yes Patient able to express need for assistance with ADLs?: Yes Independently performs ADLs?: Yes (appropriate for developmental age)  Prior Inpatient Therapy Prior Inpatient Therapy: Yes Prior Therapy Dates: June '15 Prior Therapy Facilty/Provider(s): [facility] Reason for Treatment: Stressors  Prior Outpatient Therapy Prior Outpatient Therapy: No Prior Therapy Dates: None Prior Therapy Facilty/Provider(s): N/a Reason for Treatment: N/a  ADL Screening (condition at time of admission) Patient's cognitive ability adequate to safely complete daily activities?: Yes Is the patient deaf or have difficulty hearing?: No Does the patient have difficulty seeing, even when wearing glasses/contacts?: No (Wears glasses.) Does the patient have difficulty concentrating, remembering, or making decisions?: No Patient able to express need for assistance with ADLs?: Yes Does the patient have difficulty dressing or bathing?: No Independently performs ADLs?: Yes (appropriate for developmental age) Does the patient have difficulty walking or climbing stairs?: No Weakness of Legs: None Weakness of Arms/Hands: None       Abuse/Neglect Assessment (Assessment to be complete while patient is alone) Physical Abuse: Denies Verbal Abuse: Denies Sexual Abuse: Denies Exploitation of patient/patient's resources: Denies Self-Neglect: Denies     Regulatory affairs officer (For Healthcare) Does patient have an advance directive?: No Would patient like information on creating an advanced directive?: No - patient declined information     Additional Information 1:1 In Past 12 Months?: No CIRT Risk: No Elopement Risk: No Does patient have medical clearance?: Yes     Disposition:  Disposition Initial Assessment Completed for this Encounter: Yes Disposition of Patient: Inpatient treatment program, Referred to Type of inpatient treatment program: Adult Patient referred to:  (Pt to be reviewed with NP)  Curlene Dolphin Ray 08/16/2014 11:31 PM

## 2014-08-16 NOTE — Progress Notes (Signed)
EDCM spoke to patient at bedside.  [facility] asked patient if Dr. Thressa Sheller is still her pcp?  Patient responded, "Who knows."  Patient confirms she has [employer].  Patient requesting pain medicine for pain in her "arm, back, left knee, neck and hip."  Per chart review, patient stated she was diagnosed with pancreatic cancer and that she has "six days to six months to live."  [facility] asked patient who diagnosed her with pancreatic cancer?  Patient stated, "I was diagnosed at the [facility] last week.  I was having an attack.  I kept having diarrhea."  Patient reports an xray was done and they told her she had pancreatic cancer.  Then patient stated she had an xray 4-5 years ago that showed she had pancreatic cancer.  [facility] asked patient if the xray showed pancreatitis or pancreatic cancer?  Patient stated, "I've always had the pancritis they said cancer."  Patient tghen stated, "I was in a coma for three months three years ago."  Patient appears intoxicated.  EDSW in to speak to patient.  Discussed patient with EDP.  No further EDCM needs at this time.

## 2014-08-16 NOTE — Progress Notes (Signed)
CSW met with pt at bedside. There was no family present. The patient presents to [facility] tonight due to self harm.  The pt informed CSW that she comes from home and lives alone in . Patient admits to cutting herself tonight. She informed CSW that she was mad. Patient stated " I have pancreatic cancer. Im going to die. Im mad." CSW does not see a hx of pancreatic cancer in chart.    Patient informed CSW that her only support is her best friend " Steve" [phone]. She informed CSW that he visits her often.   Patient informed CSW that she is not interested in going to a facility or receiving any services. Patient states that she is ready to go home.  Patient denies SI/HI and AH and VH.   , LCSWA [phone] ED CSW 08/16/2014 11:51 PM    

## 2014-08-16 NOTE — ED Provider Notes (Signed)
LACERATION REPAIR Performed by: Linus Mako Authorized by: Linus Mako Consent: Verbal consent obtained. Risks and benefits: risks, benefits and alternatives were discussed Consent given by: patient Patient identity confirmed: provided demographic data Prepped and Draped in normal sterile fashion Wound explored  Laceration Location: left wrist  Laceration Length: 8cm  No Foreign Bodies seen or palpated  Anesthesia: local infiltration  Local anesthetic: lidocaine 2% with epinephrine  Anesthetic total: 4 ml  Irrigation method: syringe Amount of cleaning: standard  Skin closure: sutures  Number of sutures: 16  Technique: simple interrupted, 5'0 prolene suture  Patient tolerance: Patient tolerated the procedure well with no immediate complications.  Patient seen by Dr. Maryan Rued. Refer to her note for HPI/ROS/PE and plan. Wound wrapped in sterile gauze.   Delos Haring, PA-C 08/16/14 [id number]  Blanchie Dessert, MD 08/16/14 2325

## 2014-08-16 NOTE — ED Notes (Addendum)
Pt. In burgundy scrubs. Pt. And belongings searched and wanded by security.pt. Has 1 belongings bag. Pt. Has 1 white pajama shirt and white pajama pants, 1 car key and 2 keys for house and pair lepard bedroom shoes. Pt. Belongings locked up at the nurses station in the blue zone.

## 2014-08-16 NOTE — ED Provider Notes (Addendum)
CSN: [id number]     Arrival date & time 08/16/14  1921 History   First MD Initiated Contact with Patient 08/16/14 1935     Chief Complaint  Patient presents with  . Suicide Attempt     (Consider location/radiation/quality/duration/timing/severity/associated sxs/prior Treatment) HPI Comments: Patient in an attempt to take her life tonight drink alcohol, use marijuana and took a razor blade and attempted to cut her left wrist. He per saw her on the back porch and she called for help. Neighbor called the place he brought her here for further care  Patient is a 59 y.o. female presenting with mental health disorder. The history is provided by the patient.  Mental Health Problem Presenting symptoms: suicide attempt   Patient accompanied by:  Law enforcement Degree of incapacity (severity):  Severe Onset quality:  Gradual Timing:  Constant Progression:  Worsening Chronicity:  New Context: alcohol use, drug abuse and stressful life event   Context comment:  Patient was recently diagnosed with pancreatic cancer in given 6 months to live Treatment compliance:  Untreated Relieved by:  None tried Worsened by:  Nothing tried Associated symptoms: anhedonia and poor judgment   Risk factors: no hx of mental illness     Past Medical History  Diagnosis Date  . GERD (gastroesophageal reflux disease)   . Rosacea   . Elevated liver function tests   . Wears glasses   . Chronic diarrhea   . Alcohol abuse     H/o withdrawal   . PONV (postoperative nausea and vomiting)   . Pancreatitis    Past Surgical History  Procedure Laterality Date  . Cholecystectomy    . Shoulder surgery  [id number]  . Diagnostic mammogram  2008  . Tubal ligation    . Colonoscopy  Never  . Esophagogastroduodenoscopy  03/21/2011    Procedure: ESOPHAGOGASTRODUODENOSCOPY (EGD);  Surgeon: Scarlette Shorts, MD;  Location: [facility] ENDOSCOPY;  Service: Endoscopy;  Laterality: N/A;  Patient may need to be done at bedside tomorrow depending  on status  . Ercp  10/06/2011    Procedure: ENDOSCOPIC RETROGRADE CHOLANGIOPANCREATOGRAPHY (ERCP);  Surgeon: Beryle Beams, MD;  Location: [facility] ENDOSCOPY;  Service: Endoscopy;  Laterality: N/A;  . Laparotomy  01/03/2012    Procedure: EXPLORATORY LAPAROTOMY;  Surgeon: Pedro Earls, MD;  Location: [facility] ORS;  Service: General;  Laterality: N/A;  debridement of necrotic pancreas and placement of drains x2  . Esophagogastroduodenoscopy  01/06/2012    Procedure: ESOPHAGOGASTRODUODENOSCOPY (EGD);  Surgeon: Wonda Horner, MD;  Location: [facility] ENDOSCOPY;  Service: Endoscopy;  Laterality: N/A;  bedside   Family History  Problem Relation Age of Onset  . Stroke Mother   . Heart disease Father   . Heart failure Father   . Heart disease Sister    History  Substance Use Topics  . Smoking status: Former Smoker -- 1.00 packs/day for 15 years    Quit date: 09/22/1978  . Smokeless tobacco: Never Used  . Alcohol Use: 1.5 - 2.0 oz/week    3-4 Standard drinks or equivalent per week     Comment: gets dt's - can make it up to 1 week at times   OB History    No data available     Review of Systems  All other systems reviewed and are negative.     Allergies  Ciprofloxacin  Home Medications   Prior to Admission medications   Medication Sig Start Date End Date Taking? Authorizing Provider  albuterol (PROVENTIL HFA;VENTOLIN HFA) 108 (90 BASE) MCG/ACT  inhaler Inhale 2 puffs into the lungs every 6 (six) hours as needed for wheezing or shortness of breath. 08/01/14  Yes Melony Overly, MD  FLUoxetine (PROZAC) 20 MG capsule Take 1 capsule (20 mg total) by mouth daily. For depression 10/10/13  Yes Encarnacion Slates, NP  azithromycin (ZITHROMAX Z-PAK) 250 MG tablet Take 2 pills today, then 1 pill daily until gone. Patient not taking: Reported on 08/16/2014 08/01/14   Melony Overly, MD  gabapentin (NEURONTIN) 300 MG capsule Take 1 capsule (300 mg total) by mouth 3 (three) times daily. For substance withdrawal syndrome  10/10/13   Encarnacion Slates, NP  hydrOXYzine (ATARAX/VISTARIL) 25 MG tablet Take 1 tablet (25 mg) three times daily as needed: For anxiety 10/10/13   Encarnacion Slates, NP  ibuprofen (ADVIL,MOTRIN) 200 MG tablet Take 2 tablets (400 mg total) by mouth every 6 (six) hours as needed for moderate pain (for pain.). 10/10/13   Encarnacion Slates, NP  nicotine (NICODERM CQ - DOSED IN MG/24 HOURS) 21 mg/24hr patch Place 1 patch (21 mg total) onto the skin daily. For nicotine addiction 10/10/13   Encarnacion Slates, NP  Pancrelipase, Lip-Prot-Amyl, 6000 UNITS CPEP Take 1 capsule (6,000 Units total) by mouth 3 (three) times daily with meals. 08/01/14   Melony Overly, MD  predniSONE (DELTASONE) 50 MG tablet Take first pill now, then take daily with breakfast 08/01/14   Melony Overly, MD  traZODone (DESYREL) 50 MG tablet Take 1 tablet (50 mg total) by mouth at bedtime as needed for sleep (may repeat X 1). For sleep 10/10/13   Encarnacion Slates, NP   BP 129/73 mmHg  Pulse 86  Temp(Src) 98.8 F (37.1 C) (Oral)  Resp 16  SpO2 99%  LMP 12/08/2006 Physical Exam  Constitutional: She is oriented to person, place, and time. She appears well-developed and well-nourished. No distress.  HENT:  Head: Normocephalic and atraumatic.  Mouth/Throat: Oropharynx is clear and moist.  Eyes: Conjunctivae and EOM are normal. Pupils are equal, round, and reactive to light.  Neck: Normal range of motion. Neck supple.  Cardiovascular: Normal rate, regular rhythm and intact distal pulses.   No murmur heard. Pulmonary/Chest: Effort normal and breath sounds normal. No respiratory distress. She has no wheezes. She has no rales.  Abdominal: Soft. She exhibits no distension. There is no tenderness. There is no rebound and no guarding.  Musculoskeletal: Normal range of motion. She exhibits no edema or tenderness.       Left wrist: She exhibits laceration.       Arms: Neurological: She is alert and oriented to person, place, and time.  Skin: Skin is warm and  dry. No rash noted. No erythema.  Psychiatric: Her behavior is normal. Her affect is not blunt. She exhibits a depressed mood. She expresses suicidal ideation. She expresses suicidal plans.  Nursing note and vitals reviewed.   ED Course  Procedures (including critical care time) Labs Review Labs Reviewed  CBC WITH DIFFERENTIAL/PLATELET - Abnormal; Notable for the following:    WBC 10.9 (*)    All other components within normal limits  ACETAMINOPHEN LEVEL - Abnormal; Notable for the following:    Acetaminophen (Tylenol), Serum <10.0 (*)    All other components within normal limits  URINE RAPID DRUG SCREEN (HOSP PERFORMED) - Abnormal; Notable for the following:    Benzodiazepines POSITIVE (*)    Tetrahydrocannabinol POSITIVE (*)    All other components within normal limits  ETHANOL - Abnormal; Notable  for the following:    Alcohol, Ethyl (B) 208 (*)    All other components within normal limits  BASIC METABOLIC PANEL  SALICYLATE LEVEL    Imaging Review No results found.   EKG Interpretation None      MDM   Final diagnoses:  Suicidal behavior  Alcohol abuse  Wrist laceration, left, initial encounter   patient presenting after a suicidal attempt. She was sitting at home and recently had been drinking alcohol and smoking marijuana. She cut her wrist with a razor. Patient recently was diagnosed with pancreatic cancer and given 6 months to live. Patient has a history of chronic alcohol abuse, pancreatitis but no prior suicidal attempts. Tetanus shot is up-to-date and patient has a laceration of the left wrist with partial tendon involvement either the flexor digitorum superficialis or the palmaris longus tendon. Difficult to discern which one is partially involved. However patient has normal flexion and extension of the wrist as well as flexion and extension of the DIP and PIP joints. Slight weakness noted with these movements however may be effort dependent.  Wound repaired as  above. Medical screening labs pending. Once patient is sober will have psychiatry evaluate.  Pt is cleared for psychiatric evaluation.  Also pt at this time has no proof of pancreatic cancer.  There is no documentation or imaging found to support this at this time.  Pt placed in cock up splint.  Will need hand f/u.  Blanchie Dessert, MD 08/16/14 [id number]  Blanchie Dessert, MD 08/16/14 2308

## 2014-08-16 NOTE — ED Notes (Signed)
Pt presents from home, medic report that pt was found by roommate drunk and smoking marijuana, razor blade self inflicted cuts on her left forearm which when asked, she endorses the cuts was a suicide attempt. Pt states that she was "recently diagnosed with pancreatic cancer and was given 6 days to 6 months to live," adds that "today marks the 6 day since the terminal  diagnosis. C/o of severe pain in her abdomen. For this triage encounter, we have thoroughly assessed pt's body for any other cuts and contrabands, pt maintains that she is "hopeless," suicide precaution initiated with have a 1:1 sitter at bedside.

## 2014-08-16 NOTE — ED Notes (Signed)
Bed: WA20 Expected date:  Expected time:  Means of arrival:  Comments: EMS- SI attempt, wrist lac

## 2014-08-16 NOTE — ED Notes (Signed)
Patient keeps asking for morphine.

## 2014-08-17 ENCOUNTER — Encounter (HOSPITAL_COMMUNITY): Payer: Self-pay | Admitting: *Deleted

## 2014-08-17 ENCOUNTER — Inpatient Hospital Stay (HOSPITAL_COMMUNITY)
Admission: AD | Admit: 2014-08-17 | Discharge: 2014-08-21 | DRG: 897 | Disposition: A | Discharge status: Home / Self Care | Payer: 59 | Attending: Psychiatry | Admitting: Psychiatry

## 2014-08-17 DIAGNOSIS — Z8249 Family history of ischemic heart disease and other diseases of the circulatory system: Secondary | ICD-10-CM | POA: Diagnosis not present

## 2014-08-17 DIAGNOSIS — F1010 Alcohol abuse, uncomplicated: Secondary | ICD-10-CM | POA: Insufficient documentation

## 2014-08-17 DIAGNOSIS — R4689 Other symptoms and signs involving appearance and behavior: Secondary | ICD-10-CM

## 2014-08-17 DIAGNOSIS — G4700 Insomnia, unspecified: Secondary | ICD-10-CM | POA: Diagnosis present

## 2014-08-17 DIAGNOSIS — F1020 Alcohol dependence, uncomplicated: Secondary | ICD-10-CM | POA: Diagnosis present

## 2014-08-17 DIAGNOSIS — R4589 Other symptoms and signs involving emotional state: Secondary | ICD-10-CM | POA: Insufficient documentation

## 2014-08-17 DIAGNOSIS — F42 Obsessive-compulsive disorder: Secondary | ICD-10-CM | POA: Diagnosis present

## 2014-08-17 DIAGNOSIS — S61519A Laceration without foreign body of unspecified wrist, initial encounter: Secondary | ICD-10-CM | POA: Insufficient documentation

## 2014-08-17 DIAGNOSIS — K760 Fatty (change of) liver, not elsewhere classified: Secondary | ICD-10-CM | POA: Diagnosis present

## 2014-08-17 DIAGNOSIS — Z87891 Personal history of nicotine dependence: Secondary | ICD-10-CM | POA: Diagnosis not present

## 2014-08-17 DIAGNOSIS — Z823 Family history of stroke: Secondary | ICD-10-CM

## 2014-08-17 DIAGNOSIS — F332 Major depressive disorder, recurrent severe without psychotic features: Secondary | ICD-10-CM | POA: Diagnosis present

## 2014-08-17 DIAGNOSIS — F410 Panic disorder [episodic paroxysmal anxiety] without agoraphobia: Secondary | ICD-10-CM | POA: Diagnosis present

## 2014-08-17 HISTORY — DX: Depression, unspecified: F32.A

## 2014-08-17 HISTORY — DX: Major depressive disorder, single episode, unspecified: F32.9

## 2014-08-17 HISTORY — DX: Anxiety disorder, unspecified: F41.9

## 2014-08-17 MED ORDER — ADULT MULTIVITAMIN W/MINERALS CH
1.0000 | ORAL_TABLET | Freq: Every day | ORAL | Status: DC
  Administered 2014-08-17: 1 via ORAL
  Filled 2014-08-17: qty 1

## 2014-08-17 MED ORDER — ALUM & MAG HYDROXIDE-SIMETH 200-200-20 MG/5ML PO SUSP: 30.0000 mL | ORAL | Status: DC | PRN

## 2014-08-17 MED ORDER — ONDANSETRON 4 MG PO TBDP: 4.0000 mg | ORAL_TABLET | Freq: Four times a day (QID) | ORAL | Status: DC | PRN

## 2014-08-17 MED ORDER — ACETAMINOPHEN 325 MG PO TABS
650.0000 mg | ORAL_TABLET | Freq: Four times a day (QID) | ORAL | Status: DC | PRN
  Administered 2014-08-19 – 2014-08-21 (×4): 650 mg via ORAL
  Filled 2014-08-17 (×5): qty 2

## 2014-08-17 MED ORDER — ACETAMINOPHEN 325 MG PO TABS: 650.0000 mg | ORAL_TABLET | Freq: Four times a day (QID) | ORAL | Status: DC | PRN

## 2014-08-17 MED ORDER — LORAZEPAM 1 MG PO TABS: 1.0000 mg | ORAL_TABLET | Freq: Four times a day (QID) | ORAL | Status: DC | PRN

## 2014-08-17 MED ORDER — GABAPENTIN 300 MG PO CAPS
300.0000 mg | ORAL_CAPSULE | Freq: Three times a day (TID) | ORAL | Status: DC
  Administered 2014-08-17 – 2014-08-19 (×6): 300 mg via ORAL
  Filled 2014-08-17 (×12): qty 1

## 2014-08-17 MED ORDER — LOPERAMIDE HCL 2 MG PO CAPS: 2.0000 mg | ORAL_CAPSULE | ORAL | Status: DC | PRN

## 2014-08-17 MED ORDER — LOPERAMIDE HCL 2 MG PO CAPS: 2.0000 mg | ORAL_CAPSULE | ORAL | Status: AC | PRN

## 2014-08-17 MED ORDER — LORAZEPAM 1 MG PO TABS
1.0000 mg | ORAL_TABLET | Freq: Two times a day (BID) | ORAL | Status: AC
  Administered 2014-08-20 – 2014-08-21 (×2): 1 mg via ORAL
  Filled 2014-08-17 (×2): qty 1

## 2014-08-17 MED ORDER — THIAMINE HCL 100 MG/ML IJ SOLN
100.0000 mg | Freq: Once | INTRAMUSCULAR | Status: AC
  Administered 2014-08-17: 100 mg via INTRAMUSCULAR
  Filled 2014-08-17: qty 2

## 2014-08-17 MED ORDER — THIAMINE HCL 100 MG/ML IJ SOLN: 100.0000 mg | Freq: Once | INTRAMUSCULAR | Status: DC

## 2014-08-17 MED ORDER — TRAZODONE HCL 50 MG PO TABS
50.0000 mg | ORAL_TABLET | Freq: Every evening | ORAL | Status: DC | PRN
  Administered 2014-08-17 – 2014-08-20 (×4): 50 mg via ORAL
  Filled 2014-08-17 (×4): qty 1
  Filled 2014-08-17: qty 8

## 2014-08-17 MED ORDER — LORAZEPAM 1 MG PO TABS
1.0000 mg | ORAL_TABLET | Freq: Every day | ORAL | Status: DC
Start: 2014-08-22 — End: 2014-08-21

## 2014-08-17 MED ORDER — PANCRELIPASE (LIP-PROT-AMYL) 12000-38000 UNITS PO CPEP
12000.0000 [IU] | ORAL_CAPSULE | Freq: Three times a day (TID) | ORAL | Status: DC
  Administered 2014-08-17 – 2014-08-21 (×12): 12000 [IU] via ORAL
  Filled 2014-08-17 (×18): qty 1

## 2014-08-17 MED ORDER — GUAIFENESIN 200 MG PO TABS
200.0000 mg | ORAL_TABLET | ORAL | Status: DC | PRN
  Filled 2014-08-17: qty 1

## 2014-08-17 MED ORDER — LORAZEPAM 1 MG PO TABS
1.0000 mg | ORAL_TABLET | Freq: Three times a day (TID) | ORAL | Status: AC
  Administered 2014-08-19 – 2014-08-20 (×3): 1 mg via ORAL
  Filled 2014-08-17 (×3): qty 1

## 2014-08-17 MED ORDER — MAGNESIUM HYDROXIDE 400 MG/5ML PO SUSP: 30.0000 mL | Freq: Every day | ORAL | Status: DC | PRN

## 2014-08-17 MED ORDER — VITAMIN B-1 100 MG PO TABS
100.0000 mg | ORAL_TABLET | Freq: Every day | ORAL | Status: DC
  Administered 2014-08-18 – 2014-08-21 (×4): 100 mg via ORAL
  Filled 2014-08-17 (×3): qty 1
  Filled 2014-08-17: qty 4
  Filled 2014-08-17 (×2): qty 1

## 2014-08-17 MED ORDER — ONDANSETRON 4 MG PO TBDP
4.0000 mg | ORAL_TABLET | Freq: Four times a day (QID) | ORAL | Status: AC | PRN
  Administered 2014-08-18: 4 mg via ORAL
  Filled 2014-08-17: qty 1

## 2014-08-17 MED ORDER — NICOTINE 21 MG/24HR TD PT24
21.0000 mg | MEDICATED_PATCH | Freq: Every day | TRANSDERMAL | Status: DC
  Administered 2014-08-18 – 2014-08-21 (×4): 21 mg via TRANSDERMAL
  Filled 2014-08-17 (×4): qty 1
  Filled 2014-08-17: qty 14
  Filled 2014-08-17: qty 1

## 2014-08-17 MED ORDER — IBUPROFEN 600 MG PO TABS
600.0000 mg | ORAL_TABLET | Freq: Four times a day (QID) | ORAL | Status: DC | PRN
  Administered 2014-08-17 – 2014-08-19 (×6): 600 mg via ORAL
  Filled 2014-08-17 (×6): qty 1

## 2014-08-17 MED ORDER — HYDROXYZINE HCL 25 MG PO TABS
25.0000 mg | ORAL_TABLET | Freq: Four times a day (QID) | ORAL | Status: DC | PRN
  Administered 2014-08-17: 25 mg via ORAL
  Filled 2014-08-17: qty 1

## 2014-08-17 MED ORDER — HYDROXYZINE HCL 25 MG PO TABS
25.0000 mg | ORAL_TABLET | Freq: Four times a day (QID) | ORAL | Status: DC | PRN
  Administered 2014-08-18 – 2014-08-19 (×3): 25 mg via ORAL
  Filled 2014-08-17 (×3): qty 1

## 2014-08-17 MED ORDER — ALBUTEROL SULFATE HFA 108 (90 BASE) MCG/ACT IN AERS: 2.0000 | INHALATION_SPRAY | Freq: Four times a day (QID) | RESPIRATORY_TRACT | Status: DC | PRN

## 2014-08-17 MED ORDER — FLUOXETINE HCL 20 MG PO CAPS
20.0000 mg | ORAL_CAPSULE | Freq: Every day | ORAL | Status: DC
  Administered 2014-08-18 – 2014-08-19 (×2): 20 mg via ORAL
  Filled 2014-08-17 (×4): qty 1

## 2014-08-17 MED ORDER — ADULT MULTIVITAMIN W/MINERALS CH
1.0000 | ORAL_TABLET | Freq: Every day | ORAL | Status: DC
  Administered 2014-08-18 – 2014-08-21 (×4): 1 via ORAL
  Filled 2014-08-17 (×6): qty 1

## 2014-08-17 MED ORDER — MAGNESIUM HYDROXIDE 400 MG/5ML PO SUSP
30.0000 mL | Freq: Every day | ORAL | Status: DC | PRN
  Filled 2014-08-17: qty 30

## 2014-08-17 MED ORDER — ACETAMINOPHEN 325 MG PO TABS
650.0000 mg | ORAL_TABLET | Freq: Four times a day (QID) | ORAL | Status: DC | PRN
  Administered 2014-08-17 (×2): 650 mg via ORAL
  Filled 2014-08-17 (×2): qty 2

## 2014-08-17 MED ORDER — LORAZEPAM 1 MG PO TABS
1.0000 mg | ORAL_TABLET | Freq: Four times a day (QID) | ORAL | Status: AC
Start: 2014-08-17 — End: 2014-08-19
  Administered 2014-08-17 – 2014-08-19 (×6): 1 mg via ORAL
  Filled 2014-08-17 (×6): qty 1

## 2014-08-17 MED ORDER — VITAMIN B-1 100 MG PO TABS
100.0000 mg | ORAL_TABLET | Freq: Every day | ORAL | Status: DC
Start: 2014-08-18 — End: 2014-08-17

## 2014-08-17 MED ORDER — ACETAMINOPHEN 325 MG PO TABS
650.0000 mg | ORAL_TABLET | Freq: Four times a day (QID) | ORAL | Status: DC | PRN
  Administered 2014-08-17: 650 mg via ORAL

## 2014-08-17 NOTE — Progress Notes (Addendum)
Patient is 59 yrs old, involuntary, alcohol and THC use.  Patient cut left lower arm, 16 sutures.  Bandaged by ER.  Bruises on buttocks, old scars on lower abd from pancreatic surgery 2.5 yrs ago, bruise lower abd.  Patient works at [employer] 2.5 years.  Stated she has a BS in accounting.  Denied SI and HI during admission, contracts for safety.  Denied A/V hallucinations.  Rated depression 8, anxiety 20, denied hopeless.  Stated she drinks a few beers Monday through Friday.  Alcohol, couple bottles wine daily when not working.  Started drinking alcohol since 30 yrs of age, did not drink for 15 yrs, then started drinking again.  "I'm an alcoholic."  Smokes THC "tiny bowls on the weekends", since age of 42 yrs.  Denied cocaine or heroin use.  Smokes one pack cigarettes daily.  Patient was in her yard, had cut her L forearm, neighbor saw her bleeding and called the police.   Has son age 5 yrs.  Patient is divorced.  Dr. Thressa Sheller, [place] is her regular physician.  Patient was admitted to [facility] approximately one yr ago.  Patient stated she is "just depressed, sick of pancreatitis.  Always have to go to the bathroom, can't do anything."  Fall risk information given and discussed with patient, high fall risk, fell at home in past  6 months. Patient has been cooperative and pleasant. [address] has one set of key, one pack of dental floss. Patient offered food/drink.  Patient has been cooperative and pleasant. Alcohol Use Education reviewed and given to patient,  Score 20+.   Alcohol Education provided to patient for her review.

## 2014-08-17 NOTE — Progress Notes (Signed)
Patient ID: [name], female   DOB: 07-01-1955, 59 y.o.   MRN: [id number]  D: Patient alert and cooperative. Pt detoxing from [place] and c/o tremors, nausea, generalized pain and anxiety. Pt reports feeling hopeless and depressed. Pt mood/affect is anxious and sad. Pt endorses suicidal ideation but contract to come to staff if feeling unsafe.   A: Medications administered as prescribed. Emotional support given and will continue to monitor pt's progress for stabilization.  R: Patient remains safe and complaint with medications.

## 2014-08-17 NOTE — H&P (Signed)
Psychiatric Admission Assessment Adult  Patient Identification: [name] MRN:  [id number] Date of Evaluation:  08/17/2014 Chief Complaint:  ALCOHOL ABUSE MDD Principal Diagnosis: Alcohol use disorder, severe, dependence Diagnosis:   Patient Active Problem List   Diagnosis Date Noted  . Alcohol use disorder, severe, dependence [F10.20] 08/17/2014  . Alcohol abuse [F10.10]   . Suicidal behavior [F48.9]   . Wrist laceration [S61.519A]   . Alcohol dependence [F10.20] 10/02/2013  . Alcohol withdrawal [F10.239] 10/02/2013  . Major depression, recurrent [F33.9] 10/02/2013  . Gastric ulcer [K25.9] 01/20/2012  . Fatty liver [K76.0] 10/11/2011  . 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 Elevated liver function tests [R79.89]   . GERD (gastroesophageal reflux disease) [K21.9]   . 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 HIATAL HERNIA [K44.9] 10/01/2008  . FATTY LIVER DISEASE [K76.89] 10/01/2008   History of Present Illness: [name] is a 59 y.o. female patient admitted with Alcohol use disorder, severe, Recurrent Major depressive disorder,severe, Anxiety disorder  HPI: Caucasian female, 59 years old was evaluated for suicide attempt by cutting her wrist requiring 16 sutures. She was brought in under IVC by [employer] after found in front of her porch bleeding. Patient reported that she has been dealing with Alcoholism and she just got tired of drinking. She reports drinking a beer or two daily in the evening however her Alcohol level on arrival was 208. Also her UDS is positive for Marijuana And Benzodiazepines. Patient reports that she d6ecided to end her life because she has been dealing with Pancreatitis and Pancreatic pain. Patient reports past detox treatment and was taking medications for depression and anxiety. Patient stated that she stopped taking her medications to if she can live without them. 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 Patient reports poor sleep and appetite. Patient feels hopeless and helpless. She was tearful through out the interview. She denies SI/HI/AVH at the time.  [name] was admitted for Alcohol use disorder, severe, dependence and crisis management.  She was treated discharged with the medications listed below under Medication List.  Medical problems were identified and treated as needed.  Home medications were restarted as appropriate.  Improvement was monitored by observation and [name] daily report of symptom reduction.  Emotional and mental status was monitored by daily self-inventory reports completed by [name] and clinical staff.         [name] was evaluated by the treatment team for stability and plans for continued recovery upon discharge.  [name] motivation was an integral factor for scheduling further treatment.  Employment, transportation, bed availability, health status, family support, and any pending legal issues were also considered during her hospital stay.  She was offered further treatment options upon discharge including but not limited to Residential, Intensive Outpatient, and Outpatient treatment.  [name] will follow up with the services as listed below under Follow Up Information.     Upon completion of this admission the patient was both mentally and medically stable for discharge denying suicidal/homicidal ideation, auditory/visual/tactile hallucinations, delusional thoughts and paranoia.      Elements: Location: Alcohol use disorder, severe, Major depressive disorder, recurrent severe, Anxiety disorder. Quality: severe, loss of job, feelings of hopeless and helplessness, insomnia, poor appetite, suicide attempt.. Severity: severe. Timing: acute. Duration: Chronic alcoholismmwith pancreatitis, Chronic Depression and anxiety.. Context: Brought in under IVC after  suicide attempt.  Associated Signs/Symptoms: Depression Symptoms:  depressed mood, hopelessness, anxiety, panic attacks, (Hypo)  Manic Symptoms:  NA Anxiety Symptoms:  Excessive Worry, Psychotic Symptoms:  NA PTSD Symptoms: NA Total Time spent with patient: 30 minutes  Past Medical History:  Past Medical History  Diagnosis Date  . GERD (gastroesophageal reflux disease)   . Rosacea   . Elevated liver function tests   . Wears glasses   . Chronic diarrhea   . Alcohol abuse     H/o withdrawal   . PONV (postoperative nausea and vomiting)   . Pancreatitis   . Depression   . Anxiety     Past Surgical History  Procedure Laterality Date  . Cholecystectomy    . Shoulder surgery  [id number]  . Diagnostic mammogram  2008  . Tubal ligation    . Colonoscopy  Never  . Esophagogastroduodenoscopy  03/21/2011    Procedure: ESOPHAGOGASTRODUODENOSCOPY (EGD);  Surgeon: Scarlette Shorts, MD;  Location: [facility] ENDOSCOPY;  Service: Endoscopy;  Laterality: N/A;  Patient may need to be done at bedside tomorrow depending on status  . Ercp  10/06/2011    Procedure: ENDOSCOPIC RETROGRADE CHOLANGIOPANCREATOGRAPHY (ERCP);  Surgeon: Beryle Beams, MD;  Location: [facility] ENDOSCOPY;  Service: Endoscopy;  Laterality: N/A;  . Laparotomy  01/03/2012    Procedure: EXPLORATORY LAPAROTOMY;  Surgeon: Pedro Earls, MD;  Location: [facility] ORS;  Service: General;  Laterality: N/A;  debridement of necrotic pancreas and placement of drains x2  . Esophagogastroduodenoscopy  01/06/2012    Procedure: ESOPHAGOGASTRODUODENOSCOPY (EGD);  Surgeon: Wonda Horner, MD;  Location: [facility] ENDOSCOPY;  Service: Endoscopy;  Laterality: N/A;  bedside   Family History:  Family History  Problem Relation Age of Onset  . Stroke Mother   . Heart disease Father   . Heart failure Father   . Heart disease Sister    Social History:  History  Alcohol Use  . 1.5 - 2.0 oz/week  . 3-4 Standard drinks or equivalent per week    Comment: couple bottles wine  daily when not workking, M-F when working few beers     History  Drug Use  . Yes  . Special: Marijuana    Comment: THC tiny bowls on week-ends    History   Social History  . Marital Status: Divorced    Spouse Name: N/A  . Number of Children: N/A  . Years of Education: N/A   Social History Main Topics  . Smoking status: Former Smoker -- 1.00 packs/day for 15 years    Quit date: 09/22/1978  . Smokeless tobacco: Never Used  . Alcohol Use: 1.5 - 2.0 oz/week    3-4 Standard drinks or equivalent per week     Comment: couple bottles wine daily when not workking, M-F when working few beers  . Drug Use: Yes    Special: Marijuana     Comment: THC tiny bowls on week-ends  . 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   Other Topics Concern  . None   Social History Narrative   Has a son and some friends with whom she is close. Was in rehab in 09/2010 and was sober for a few months after.    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, Work / School Withdrawal Symptoms: Other (Comment) (anxiety   depression) Name of Substance 1: alcohol 1 - Age of First Use: 59 yrs old 1 - Amount (size/oz): Monday - Friday  beer        weekends bottles of wine 1 - Frequency: daily 1 - Duration: 15 yrs without alcohol 1 - Last Use / Amount: yesterday Name of Substance 2: THC 2 - Age of First Use: age 45 yrs 2 - Amount (size/oz): "tiny bowls on weekend" 2 - Frequency: weekends 2 - Duration: since age 61 yrs 2 - Last Use / Amount: yesterday  Musculoskeletal: Strength & Muscle Tone: within normal limits Gait & Station: normal Patient leans:  N/A  Psychiatric Specialty Exam: Physical Exam  Vitals reviewed. Psychiatric: She has a normal mood and affect.    Review of Systems  Psychiatric/Behavioral: Positive for depression. The patient is nervous/anxious.   All other systems reviewed and are negative.   Blood pressure 145/74, pulse 91, temperature 98.6 F (37 C), temperature source Oral, resp. rate 17, height 5' 6" (1.676 m), weight 69.4 kg (153 lb), last menstrual period 12/08/2006, SpO2 100 %.Body mass index is 24.71 kg/(m^2).  General Appearance: Neat  Eye Contact::  Fair  Speech:  Normal Rate  Volume:  Normal  Mood:  Anxious, Depressed and Hopeless  Affect:  Depressed, Flat and Labile  Thought Process:  Intact  Orientation:  Full (Time, Place, and Person)  Thought Content:  Rumination  Suicidal Thoughts:  No  Homicidal Thoughts:  No  Memory:  Immediate;   Fair Recent;   Fair Remote;   Fair  Judgement:  Fair  Insight:  Fair  Psychomotor Activity:  Normal  Concentration:  Good  Recall:  Good  Fund of Knowledge:Good  Language: Good  Akathisia:  Negative  Handed:  Right  AIMS (if indicated):     Assets:  Desire for Improvement Resilience Social Support Talents/Skills  ADL's:  Intact  Cognition: WNL  Sleep:   6   Risk to Self: Is patient at risk for suicide?: No Risk to Others:   Prior Inpatient Therapy:   Prior Outpatient Therapy:    Alcohol Screening: 1. How often do you have a drink containing alcohol?: 4 or more times a week 2. How many drinks containing alcohol do you have on a typical day when you are drinking?: 10 or more 3. How often do you have six or more drinks on one occasion?: Daily or almost daily Preliminary Score: 8 4. How often during the last year have you found that you were not able to stop drinking once you had started?: Daily or almost daily 5. How often during the last year have you failed to do what was normally expected from you becasue of drinking?: Daily or almost daily 6.  How often during the last year have you needed a first drink in the morning to get yourself going after a heavy drinking session?: Daily or almost daily 7. How often during the last year have you had a feeling of guilt of remorse after drinking?: Daily or almost daily 8. How often during the last year have you been unable to remember what happened the night before because you had been drinking?: Daily or almost daily 9. Have you or someone else been injured as a result of your drinking?: No 10. Has a relative or friend or a doctor or another Economist  been concerned about your drinking or suggested you cut down?: Yes, during the last year Alcohol Use Disorder Identification Test Final Score (AUDIT): 36 Brief Intervention: Yes (MD informed of patient's score greater than 20)  Allergies:   Allergies  Allergen Reactions  . Ciprofloxacin Nausea Only   Lab Results:  Results for orders placed or performed during the hospital encounter of 08/16/14 (from the past 48 hour(s))  Urine rapid drug screen (hosp performed)     Status: Abnormal   Collection Time: 08/16/14  7:21 PM  Result Value Ref Range   Opiates NONE DETECTED NONE DETECTED   Cocaine NONE DETECTED NONE DETECTED   Benzodiazepines POSITIVE (A) NONE DETECTED   Amphetamines NONE DETECTED NONE DETECTED   Tetrahydrocannabinol POSITIVE (A) NONE DETECTED   Barbiturates NONE DETECTED NONE DETECTED    Comment:        DRUG SCREEN FOR MEDICAL PURPOSES ONLY.  IF CONFIRMATION IS NEEDED FOR ANY PURPOSE, NOTIFY LAB WITHIN 5 DAYS.        LOWEST DETECTABLE LIMITS FOR URINE DRUG SCREEN Drug Class       Cutoff (ng/mL) Amphetamine      1000 Barbiturate      200 Benzodiazepine   024 Tricyclics       097 Opiates          300 Cocaine          300 THC              50   CBC with Differential/Platelet     Status: Abnormal   Collection Time: 08/16/14  8:23 PM  Result Value Ref Range   WBC 10.9 (H) 4.0 - 10.5 K/uL   RBC 4.33 3.87 - 5.11 MIL/uL    Hemoglobin 14.2 12.0 - 15.0 g/dL   HCT 42.5 36.0 - 46.0 %   MCV 98.2 78.0 - 100.0 fL   MCH 32.8 26.0 - 34.0 pg   MCHC 33.4 30.0 - 36.0 g/dL   RDW 12.9 11.5 - 15.5 %   Platelets 235 150 - 400 K/uL   Neutrophils Relative % 66 43 - 77 %   Neutro Abs 7.2 1.7 - 7.7 K/uL   Lymphocytes Relative 25 12 - 46 %   Lymphs Abs 2.7 0.7 - 4.0 K/uL   Monocytes Relative 7 3 - 12 %   Monocytes Absolute 0.8 0.1 - 1.0 K/uL   Eosinophils Relative 2 0 - 5 %   Eosinophils Absolute 0.2 0.0 - 0.7 K/uL   Basophils Relative 0 0 - 1 %   Basophils Absolute 0.0 0.0 - 0.1 K/uL  Basic metabolic panel     Status: None   Collection Time: 08/16/14  8:23 PM  Result Value Ref Range   Sodium 138 135 - 145 mmol/L   Potassium 3.9 3.5 - 5.1 mmol/L   Chloride 108 96 - 112 mmol/L   CO2 20 19 - 32 mmol/L   Glucose, Bld 86 70 - 99 mg/dL   BUN 13 6 - 23 mg/dL   Creatinine, Ser 0.56 0.50 - 1.10 mg/dL   Calcium 8.6 8.4 - 10.5 mg/dL   GFR calc non Af Amer >90 >90 mL/min   GFR calc Af Amer >90 >90 mL/min    Comment: (NOTE) The eGFR has been calculated using the CKD EPI equation. This calculation has not been validated in all clinical situations. eGFR's persistently <90 mL/min signify possible Chronic Kidney Disease.    Anion gap 10 5 - 15  Acetaminophen level  Status: Abnormal   Collection Time: 08/16/14  8:23 PM  Result Value Ref Range   Acetaminophen (Tylenol), Serum <10.0 (L) 10 - 30 ug/mL    Comment:        THERAPEUTIC CONCENTRATIONS VARY SIGNIFICANTLY. A RANGE OF 10-30 ug/mL MAY BE AN EFFECTIVE CONCENTRATION FOR MANY PATIENTS. HOWEVER, SOME ARE BEST TREATED AT CONCENTRATIONS OUTSIDE THIS RANGE. ACETAMINOPHEN CONCENTRATIONS >150 ug/mL AT 4 HOURS AFTER INGESTION AND >50 ug/mL AT 12 HOURS AFTER INGESTION ARE OFTEN ASSOCIATED WITH TOXIC REACTIONS.   Salicylate level     Status: None   Collection Time: 08/16/14  8:23 PM  Result Value Ref Range   Salicylate Lvl <6.5 2.8 - 20.0 mg/dL  Ethanol      Status: Abnormal   Collection Time: 08/16/14  8:23 PM  Result Value Ref Range   Alcohol, Ethyl (B) 208 (H) 0 - 9 mg/dL    Comment:        LOWEST DETECTABLE LIMIT FOR SERUM ALCOHOL IS 11 mg/dL FOR MEDICAL PURPOSES ONLY    Current Medications: Current Facility-Administered Medications  Medication Dose Route Frequency Provider Last Rate Last Dose  . acetaminophen (TYLENOL) tablet 650 mg  650 mg Oral Q6H PRN Delfin Gant, NP   650 mg at 08/17/14 1605  . acetaminophen (TYLENOL) tablet 650 mg  650 mg Oral Q6H PRN Delfin Gant, NP      . albuterol (PROVENTIL HFA;VENTOLIN HFA) 108 (90 BASE) MCG/ACT inhaler 2 puff  2 puff Inhalation Q6H PRN Delfin Gant, NP      . alum & mag hydroxide-simeth (MAALOX/MYLANTA) 200-200-20 MG/5ML suspension 30 mL  30 mL Oral Q4H PRN Delfin Gant, NP      . Derrill Memo ON 08/18/2014] FLUoxetine (PROZAC) capsule 20 mg  20 mg Oral Daily Delfin Gant, NP      . gabapentin (NEURONTIN) capsule 300 mg  300 mg Oral TID Delfin Gant, NP   300 mg at 08/17/14 1607  . lipase/protease/amylase (CREON) capsule 12,000 Units  12,000 Units Oral TID WC Delfin Gant, NP   12,000 Units at 08/17/14 1607  . magnesium hydroxide (MILK OF MAGNESIA) suspension 30 mL  30 mL Oral Daily PRN Delfin Gant, NP      . Derrill Memo ON 08/18/2014] nicotine (NICODERM CQ - dosed in mg/24 hours) patch 21 mg  21 mg Transdermal Daily Delfin Gant, NP      . traZODone (DESYREL) tablet 50 mg  50 mg Oral QHS PRN Delfin Gant, NP       PTA Medications: Prescriptions prior to admission  Medication Sig Dispense Refill Last Dose  . albuterol (PROVENTIL HFA;VENTOLIN HFA) 108 (90 BASE) MCG/ACT inhaler Inhale 2 puffs into the lungs every 6 (six) hours as needed for wheezing or shortness of breath. 1 Inhaler 0 08/16/2014 at Unknown time  . azithromycin (ZITHROMAX Z-PAK) 250 MG tablet Take 2 pills today, then 1 pill daily until gone. (Patient not taking: Reported on  08/16/2014) 6 tablet 0   . BIOTIN PO Take 1 capsule by mouth daily.   08/16/2014 at Unknown time  . calcium carbonate (OS-CAL - DOSED IN MG OF ELEMENTAL CALCIUM) 1250 (500 CA) MG tablet Take 1 tablet by mouth daily with breakfast.   08/16/2014 at Unknown time  . FLUoxetine (PROZAC) 20 MG capsule Take 1 capsule (20 mg total) by mouth daily. For depression 30 capsule 0 Past Week at Unknown time  . ibuprofen (ADVIL,MOTRIN) 200 MG tablet Take 2 tablets (400  mg total) by mouth every 6 (six) hours as needed for moderate pain (for pain.). (Patient not taking: Reported on 08/17/2014) 30 tablet 0 Unknown at Unknown time  . Pancrelipase, Lip-Prot-Amyl, 6000 UNITS CPEP Take 1 capsule (6,000 Units total) by mouth 3 (three) times daily with meals. 90 capsule 2 08/16/2014 at Unknown time  . predniSONE (DELTASONE) 50 MG tablet Take first pill now, then take daily with breakfast (Patient not taking: Reported on 08/17/2014) 5 tablet 0     Previous Psychotropic Medications: Yes   Substance Abuse History in the last 12 months:  Yes.    Consequences of Substance Abuse: crisis inpatient management  Results for orders placed or performed during the hospital encounter of 08/16/14 (from the past 72 hour(s))  Urine rapid drug screen (hosp performed)     Status: Abnormal   Collection Time: 08/16/14  7:21 PM  Result Value Ref Range   Opiates NONE DETECTED NONE DETECTED   Cocaine NONE DETECTED NONE DETECTED   Benzodiazepines POSITIVE (A) NONE DETECTED   Amphetamines NONE DETECTED NONE DETECTED   Tetrahydrocannabinol POSITIVE (A) NONE DETECTED   Barbiturates NONE DETECTED NONE DETECTED    Comment:        DRUG SCREEN FOR MEDICAL PURPOSES ONLY.  IF CONFIRMATION IS NEEDED FOR ANY PURPOSE, NOTIFY LAB WITHIN 5 DAYS.        LOWEST DETECTABLE LIMITS FOR URINE DRUG SCREEN Drug Class       Cutoff (ng/mL) Amphetamine      1000 Barbiturate      200 Benzodiazepine   937 Tricyclics       169 Opiates          300 Cocaine           300 THC              50   CBC with Differential/Platelet     Status: Abnormal   Collection Time: 08/16/14  8:23 PM  Result Value Ref Range   WBC 10.9 (H) 4.0 - 10.5 K/uL   RBC 4.33 3.87 - 5.11 MIL/uL   Hemoglobin 14.2 12.0 - 15.0 g/dL   HCT 42.5 36.0 - 46.0 %   MCV 98.2 78.0 - 100.0 fL   MCH 32.8 26.0 - 34.0 pg   MCHC 33.4 30.0 - 36.0 g/dL   RDW 12.9 11.5 - 15.5 %   Platelets 235 150 - 400 K/uL   Neutrophils Relative % 66 43 - 77 %   Neutro Abs 7.2 1.7 - 7.7 K/uL   Lymphocytes Relative 25 12 - 46 %   Lymphs Abs 2.7 0.7 - 4.0 K/uL   Monocytes Relative 7 3 - 12 %   Monocytes Absolute 0.8 0.1 - 1.0 K/uL   Eosinophils Relative 2 0 - 5 %   Eosinophils Absolute 0.2 0.0 - 0.7 K/uL   Basophils Relative 0 0 - 1 %   Basophils Absolute 0.0 0.0 - 0.1 K/uL  Basic metabolic panel     Status: None   Collection Time: 08/16/14  8:23 PM  Result Value Ref Range   Sodium 138 135 - 145 mmol/L   Potassium 3.9 3.5 - 5.1 mmol/L   Chloride 108 96 - 112 mmol/L   CO2 20 19 - 32 mmol/L   Glucose, Bld 86 70 - 99 mg/dL   BUN 13 6 - 23 mg/dL   Creatinine, Ser 0.56 0.50 - 1.10 mg/dL   Calcium 8.6 8.4 - 10.5 mg/dL   GFR calc non Af Amer >90 >  90 mL/min   GFR calc Af Amer >90 >90 mL/min    Comment: (NOTE) The eGFR has been calculated using the CKD EPI equation. This calculation has not been validated in all clinical situations. eGFR's persistently <90 mL/min signify possible Chronic Kidney Disease.    Anion gap 10 5 - 15  Acetaminophen level     Status: Abnormal   Collection Time: 08/16/14  8:23 PM  Result Value Ref Range   Acetaminophen (Tylenol), Serum <10.0 (L) 10 - 30 ug/mL    Comment:        THERAPEUTIC CONCENTRATIONS VARY SIGNIFICANTLY. A RANGE OF 10-30 ug/mL MAY BE AN EFFECTIVE CONCENTRATION FOR MANY PATIENTS. HOWEVER, SOME ARE BEST TREATED AT CONCENTRATIONS OUTSIDE THIS RANGE. ACETAMINOPHEN CONCENTRATIONS >150 ug/mL AT 4 HOURS AFTER INGESTION AND >50 ug/mL AT 12 HOURS AFTER  INGESTION ARE OFTEN ASSOCIATED WITH TOXIC REACTIONS.   Salicylate level     Status: None   Collection Time: 08/16/14  8:23 PM  Result Value Ref Range   Salicylate Lvl <2.5 2.8 - 20.0 mg/dL  Ethanol     Status: Abnormal   Collection Time: 08/16/14  8:23 PM  Result Value Ref Range   Alcohol, Ethyl (B) 208 (H) 0 - 9 mg/dL    Comment:        LOWEST DETECTABLE LIMIT FOR SERUM ALCOHOL IS 11 mg/dL FOR MEDICAL PURPOSES ONLY     Observation Level/Precautions:  15 minute checks  Laboratory:  per ED  Psychotherapy:  group  Medications:  As per medlist  Consultations:  As needed  Discharge Concerns:  Safety  Estimated LOS:  2-7 days  Other:     Psychological Evaluations: Yes   Treatment Plan Summary: Admit for crisis management and mood stabilization. Medication management to re-stabilize current mood symptoms Group counseling sessions for coping skills Medical consults as needed Review and reinstate any pertinent home medications for other health problems   Medical Decision Making:  New problem, with additional work up planned, Review of Psycho-Social Stressors (1), Discuss test with performing physician (1), Review and summation of old records (2) and New Problem, with no additional work-up planned (3)  I certify that inpatient services furnished can reasonably be expected to improve the patient's condition.   Freda Munro May Agustin AGNP-BC 4/29/20165:49 PM I personally assessed the patient, reviewed the physical exam and labs and formulated the treatment plan Geralyn Flash A. Sabra Heck, M.D.

## 2014-08-17 NOTE — Consult Note (Signed)
[facility] Face-to-Face Psychiatry Consult   Reason for Consult:  Alcohol use disorder, severe, Recurrent Major depressive disorder,severe, Anxiety disorder Referring Physician:  EDP Patient Identification: [name] MRN:  [id number] Principal Diagnosis: Alcohol use disorder, severe, dependence Diagnosis:   Patient Active Problem List   Diagnosis Date Noted  . Alcohol use disorder, severe, dependence [F10.20] 08/17/2014    Priority: High  . Alcohol dependence [F10.20] 10/02/2013  . Alcohol withdrawal [F10.239] 10/02/2013  . Major depression, recurrent [F33.9] 10/02/2013  . Gastric ulcer [K25.9] 01/20/2012  . Fatty liver [K76.0] 10/11/2011  . 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 Elevated liver function tests [R79.89]   . GERD (gastroesophageal reflux disease) [K21.9]   . 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 HIATAL HERNIA [K44.9] 10/01/2008  . FATTY LIVER DISEASE [K76.89] 10/01/2008    Total Time spent with patient: 1 hour  Subjective:   [name] is a 58 y.o. female patient admitted with  Alcohol use disorder, severe, Recurrent Major depressive disorder,severe, Anxiety disorder  HPI:  Caucasian female, 58 years old was evaluated for suicide attempt by cutting her wrist requiring 16  sutures.  She was brought in under IVC by [employer] after found in front of her porch bleeding.   Patient reported that she has been dealing with Alcoholism and she just got tired of drinking.  She reports drinking a beer or two daily in the evening however her Alcohol level on arrival was 208.  Also her UDS is positive for Marijuana  And Benzodiazepines.   Patient reports that she  Decided to end her life because she has been dealing with Pancreatitis and Pancreatic pain.  Patient reports past detox treatment and was taking medications for depression and anxiety.  Patient stated that she stopped taking her medications to if she can live without them.   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   Patient reports poor sleep and appetite.  Patient feels hopeless and helpless.  She was tearful through out the interview.   She denies SI/HI/AVH TODAY.   She has been accepted for admission for safety and stabilization and she has a bed assigned to her.  HPI Elements:   Location:  Alcohol use disorder, severe, Major depressive disorder, recurrent severe, Anxiety disorder. Quality:  severe, loss of job, feelings of hopeless and helplessness, insomnia, poor appetite, suicide attempt.. Severity:  severe. Timing:  acute. Duration:  Chronic alcoholismmwith pancreatitis, Chronic Depression and anxiety.. Context:  Brought in under IVC after suicide attempt.  Past Medical History:  Past Medical History  Diagnosis Date  . GERD (gastroesophageal reflux disease)   . Rosacea   . Elevated liver function tests   . Wears glasses   . Chronic diarrhea   . Alcohol abuse     H/o withdrawal   . PONV (postoperative nausea and vomiting)   . Pancreatitis     Past Surgical History  Procedure Laterality Date  . Cholecystectomy    . Shoulder surgery  [id number]  . Diagnostic mammogram  2008  . Tubal ligation    . Colonoscopy  Never  . Esophagogastroduodenoscopy  03/21/2011    Procedure: ESOPHAGOGASTRODUODENOSCOPY (EGD);  Surgeon: John Perry, MD;  Location: [facility];  Service: Endoscopy;  Laterality: N/A;  Patient may need to be done at bedside tomorrow depending on status  . Ercp  10/06/2011    Procedure: ENDOSCOPIC RETROGRADE CHOLANGIOPANCREATOGRAPHY (ERCP);  Surgeon: Patrick D Hung, MD;  Location: [facility];    Service: Endoscopy;  Laterality: N/A;  . Laparotomy  01/03/2012    Procedure: EXPLORATORY LAPAROTOMY;  Surgeon: Matthew B Martin, MD;  Location: [facility];  Service: General;  Laterality: N/A;  debridement of necrotic pancreas and placement of drains x2  . Esophagogastroduodenoscopy  01/06/2012    Procedure:  ESOPHAGOGASTRODUODENOSCOPY (EGD);  Surgeon: Salem F Ganem, MD;  Location: [facility];  Service: Endoscopy;  Laterality: N/A;  bedside   Family History:  Family History  Problem Relation Age of Onset  . Stroke Mother   . Heart disease Father   . Heart failure Father   . Heart disease Sister    Social History:  History  Alcohol Use  . 1.5 - 2.0 oz/week  . 3-4 Standard drinks or equivalent per week    Comment: gets dt's - can make it up to 1 week at times     History  Drug Use No    History   Social History  . Marital Status: Divorced    Spouse Name: N/A  . Number of Children: N/A  . Years of Education: N/A   Social History Main Topics  . Smoking status: Former Smoker -- 1.00 packs/day for 15 years    Quit date: 09/22/1978  . Smokeless tobacco: Never Used  . Alcohol Use: 1.5 - 2.0 oz/week    3-4 Standard drinks or equivalent per week     Comment: gets dt's - can make it up to 1 week at times  . Drug Use: No  . Sexual Activity: No     Comment: Works in data entry and accounting at [employer]   Other Topics Concern  . None   Social History Narrative   Has a son and some friends with whom she is close. Was in rehab in 09/2010 and was sober for a few months after.    Additional Social History:    Pain Medications: N/A Prescriptions: Lexapro 20mg.  Pt says she does not take it regularly. Over the Counter: Calcium, Biotin, Vitamin D History of alcohol / drug use?: Yes Name of Substance 1: ETOH (usually beer or wine) 1 - Age of First Use: 59 years of age 1 - Amount (size/oz): 3 beers per day twice weekly 1 - Frequency: On weekends a six pack 1 - Duration: On-going 1 - Last Use / Amount: 04/28 "I probably drank a bottle of wine." Name of Substance 2: Marijuana 2 - Age of First Use: 59 years of age 2 - Amount (size/oz): "one little bowl about twice weekly." 2 - Frequency: Twice weekly 2 - Duration: On-going 2 - Last Use / Amount: 04/28                  Allergies:   Allergies  Allergen Reactions  . Ciprofloxacin Nausea Only    Labs:  Results for orders placed or performed during the hospital encounter of 08/16/14 (from the past 48 hour(s))  Urine rapid drug screen (hosp performed)     Status: Abnormal   Collection Time: 08/16/14  7:21 PM  Result Value Ref Range   Opiates NONE DETECTED NONE DETECTED   Cocaine NONE DETECTED NONE DETECTED   Benzodiazepines POSITIVE (A) NONE DETECTED   Amphetamines NONE DETECTED NONE DETECTED   Tetrahydrocannabinol POSITIVE (A) NONE DETECTED   Barbiturates NONE DETECTED NONE DETECTED    Comment:        DRUG SCREEN FOR MEDICAL PURPOSES ONLY.  IF CONFIRMATION IS NEEDED FOR ANY PURPOSE, NOTIFY LAB WITHIN 5 DAYS.          LOWEST DETECTABLE LIMITS FOR URINE DRUG SCREEN Drug Class       Cutoff (ng/mL) Amphetamine      1000 Barbiturate      200 Benzodiazepine   200 Tricyclics       300 Opiates          300 Cocaine          300 THC              50   CBC with Differential/Platelet     Status: Abnormal   Collection Time: 08/16/14  8:23 PM  Result Value Ref Range   WBC 10.9 (H) 4.0 - 10.5 K/uL   RBC 4.33 3.87 - 5.11 MIL/uL   Hemoglobin 14.2 12.0 - 15.0 g/dL   HCT 42.5 36.0 - 46.0 %   MCV 98.2 78.0 - 100.0 fL   MCH 32.8 26.0 - 34.0 pg   MCHC 33.4 30.0 - 36.0 g/dL   RDW 12.9 11.5 - 15.5 %   Platelets 235 150 - 400 K/uL   Neutrophils Relative % 66 43 - 77 %   Neutro Abs 7.2 1.7 - 7.7 K/uL   Lymphocytes Relative 25 12 - 46 %   Lymphs Abs 2.7 0.7 - 4.0 K/uL   Monocytes Relative 7 3 - 12 %   Monocytes Absolute 0.8 0.1 - 1.0 K/uL   Eosinophils Relative 2 0 - 5 %   Eosinophils Absolute 0.2 0.0 - 0.7 K/uL   Basophils Relative 0 0 - 1 %   Basophils Absolute 0.0 0.0 - 0.1 K/uL  Basic metabolic panel     Status: None   Collection Time: 08/16/14  8:23 PM  Result Value Ref Range   Sodium 138 135 - 145 mmol/L   Potassium 3.9 3.5 - 5.1 mmol/L   Chloride 108 96 - 112 mmol/L   CO2 20 19 - 32 mmol/L    Glucose, Bld 86 70 - 99 mg/dL   BUN 13 6 - 23 mg/dL   Creatinine, Ser 0.56 0.50 - 1.10 mg/dL   Calcium 8.6 8.4 - 10.5 mg/dL   GFR calc non Af Amer >90 >90 mL/min   GFR calc Af Amer >90 >90 mL/min    Comment: (NOTE) The eGFR has been calculated using the CKD EPI equation. This calculation has not been validated in all clinical situations. eGFR's persistently <90 mL/min signify possible Chronic Kidney Disease.    Anion gap 10 5 - 15  Acetaminophen level     Status: Abnormal   Collection Time: 08/16/14  8:23 PM  Result Value Ref Range   Acetaminophen (Tylenol), Serum <10.0 (L) 10 - 30 ug/mL    Comment:        THERAPEUTIC CONCENTRATIONS VARY SIGNIFICANTLY. A RANGE OF 10-30 ug/mL MAY BE AN EFFECTIVE CONCENTRATION FOR MANY PATIENTS. HOWEVER, SOME ARE BEST TREATED AT CONCENTRATIONS OUTSIDE THIS RANGE. ACETAMINOPHEN CONCENTRATIONS >150 ug/mL AT 4 HOURS AFTER INGESTION AND >50 ug/mL AT 12 HOURS AFTER INGESTION ARE OFTEN ASSOCIATED WITH TOXIC REACTIONS.   Salicylate level     Status: None   Collection Time: 08/16/14  8:23 PM  Result Value Ref Range   Salicylate Lvl <4.0 2.8 - 20.0 mg/dL  Ethanol     Status: Abnormal   Collection Time: 08/16/14  8:23 PM  Result Value Ref Range   Alcohol, Ethyl (B) 208 (H) 0 - 9 mg/dL    Comment:        LOWEST DETECTABLE LIMIT FOR SERUM ALCOHOL IS 11 mg/dL FOR MEDICAL   PURPOSES ONLY     Vitals: Blood pressure 141/91, pulse 86, temperature 98.6 F (37 C), temperature source Oral, resp. rate 21, last menstrual period 12/08/2006, SpO2 98 %.  Risk to Self: Suicidal Ideation: Yes-Currently Present Suicidal Intent: Yes-Currently Present Is patient at risk for suicide?: Yes Suicidal Plan?: Yes-Currently Present Specify Current Suicidal Plan: Plan was to cut herself Access to Means: Yes Specify Access to Suicidal Means: [place] What has been your use of drugs/alcohol within the last 12 months?: ETOH & THC use regularly How many times?: 1 Other  Self Harm Risks: None Triggers for Past Attempts: Unknown Intentional Self Injurious Behavior: None Risk to Others: Homicidal Ideation: No Thoughts of Harm to Others: No Current Homicidal Intent: No Current Homicidal Plan: No Access to Homicidal Means: No Identified Victim: No one History of harm to others?: No Assessment of Violence: None Noted Violent Behavior Description: Pt denies Does patient have access to weapons?: No Criminal Charges Pending?: No Does patient have a court date: No Prior Inpatient Therapy: Prior Inpatient Therapy: Yes Prior Therapy Dates: June '15 Prior Therapy Facilty/Provider(s): [facility] Reason for Treatment: Stressors Prior Outpatient Therapy: Prior Outpatient Therapy: No Prior Therapy Dates: None Prior Therapy Facilty/Provider(s): N/a Reason for Treatment: N/a  Current Facility-Administered Medications  Medication Dose Route Frequency Provider Last Rate Last Dose  . acetaminophen (TYLENOL) tablet 650 mg  650 mg Oral Q6H PRN Iva Knapp, MD   650 mg at 08/17/14 0848  . albuterol (PROVENTIL HFA;VENTOLIN HFA) 108 (90 BASE) MCG/ACT inhaler 2 puff  2 puff Inhalation Q6H PRN Whitney Plunkett, MD      . FLUoxetine (PROZAC) capsule 20 mg  20 mg Oral Daily Whitney Plunkett, MD   20 mg at 08/17/14 1002  . gabapentin (NEURONTIN) capsule 300 mg  300 mg Oral TID Whitney Plunkett, MD   300 mg at 08/17/14 1002  . lipase/protease/amylase (CREON) capsule 12,000 Units  12,000 Units Oral TID WC Whitney Plunkett, MD   12,000 Units at 08/17/14 0813  . nicotine (NICODERM CQ - dosed in mg/24 hours) patch 21 mg  21 mg Transdermal Daily Whitney Plunkett, MD   21 mg at 08/17/14 1002  . traZODone (DESYREL) tablet 50 mg  50 mg Oral QHS PRN Whitney Plunkett, MD       Current Outpatient Prescriptions  Medication Sig Dispense Refill  . albuterol (PROVENTIL HFA;VENTOLIN HFA) 108 (90 BASE) MCG/ACT inhaler Inhale 2 puffs into the lungs every 6 (six) hours as needed for wheezing or shortness  of breath. 1 Inhaler 0  . BIOTIN PO Take 1 capsule by mouth daily.    . calcium carbonate (OS-CAL - DOSED IN MG OF ELEMENTAL CALCIUM) 1250 (500 CA) MG tablet Take 1 tablet by mouth daily with breakfast.    . FLUoxetine (PROZAC) 20 MG capsule Take 1 capsule (20 mg total) by mouth daily. For depression 30 capsule 0  . Pancrelipase, Lip-Prot-Amyl, 6000 UNITS CPEP Take 1 capsule (6,000 Units total) by mouth 3 (three) times daily with meals. 90 capsule 2  . azithromycin (ZITHROMAX Z-PAK) 250 MG tablet Take 2 pills today, then 1 pill daily until gone. (Patient not taking: Reported on 08/16/2014) 6 tablet 0  . ibuprofen (ADVIL,MOTRIN) 200 MG tablet Take 2 tablets (400 mg total) by mouth every 6 (six) hours as needed for moderate pain (for pain.). (Patient not taking: Reported on 08/17/2014) 30 tablet 0  . predniSONE (DELTASONE) 50 MG tablet Take first pill now, then take daily with breakfast (Patient not taking: Reported on 08/17/2014)   5 tablet 0  . [DISCONTINUED] ipratropium (ATROVENT) 0.06 % nasal spray Place 2 sprays into both nostrils 4 (four) times daily. 15 mL 0     ROS is positive for reported hx of Pancreatic pain otherwise the rest are  negative.  Musculoskeletal: Strength & Muscle Tone: within normal limits Gait & Station: normal Patient leans: N/A  Psychiatric Specialty Exam:     Blood pressure 141/91, pulse 86, temperature 98.6 F (37 C), temperature source Oral, resp. rate 21, last menstrual period 12/08/2006, SpO2 98 %.There is no weight on file to calculate BMI.  General Appearance: Casual  Eye Contact::  Good  Speech:  Clear and Coherent and Normal Rate  Volume:  Normal  Mood:  Angry, Anxious, Depressed, Hopeless and helpless  Affect:  Congruent, Depressed, Flat and Tearful  Thought Process:  Coherent, Goal Directed and Intact  Orientation:  Full (Time, Place, and Person)  Thought Content:  WDL  Suicidal Thoughts:  No  Homicidal Thoughts:  No  Memory:  Immediate;    Good Recent;   Good Remote;   Good  Judgement:  Impaired  Insight:  Good  Psychomotor Activity:  Psychomotor Retardation and Tremor  Concentration:  Good  Recall:  NA  Fund of Knowledge:Good  Language: Good  Akathisia:  NA  Handed:  Right  AIMS (if indicated):     Assets:  Desire for Improvement  ADL's:  Intact  Cognition: WNL  Sleep:      Medical Decision Making: Review of Psycho-Social Stressors (1), Established Problem, Worsening (2), Independent Review of image, tracing or specimen (2) and Review of Medication Regimen & Side Effects (2)  Treatment Plan Summary: Daily contact with patient to assess and evaluate symptoms and progress in treatment, Medication management and Plan Admit to our Chemical dependency hall for detox.  We will resume her Depression medications, anxiety medications and use our Ativan protocol for her detox.  Plan:  Recommend psychiatric Inpatient admission when medically cleared. Resume Prozac for depression, Gabapentin for mood and anxiety,Trazodone for sleep and resume her medications for other medical conditions. Disposition: see above  ONUOHA, JOSEPHINE C    PMHNP-BC 08/17/2014 10:37 AM Patient seen face-to-face for psychiatric evaluation, chart reviewed and case discussed with the physician extender and developed treatment plan. Reviewed the information documented and agree with the treatment plan.  , MD 

## 2014-08-17 NOTE — ED Notes (Signed)
Pt presents with complaint of SI, self inflicted lac to L arm, sutured in main ed, splint applied.  Pt under IVC.  Denies SI, HI or AV hallucinations.  Pt states she was cutting something and accidentally cut herself.  Pt reports she drinks a 6 pack/day.  Pt reports she has been admitted to [facility] for detox in the past.  Pt AAO x 3, calm & cooperative, no acute distress noted.  Monitoring for safety, Q 15 min checks in effect.

## 2014-08-17 NOTE — BH Assessment (Signed)
[facility] Assessment Progress Note  Per Corena Pilgrim, MD, this pt requires psychiatric hospitalization at this time.  Clayborne Dana, RN, [facility] has assigned pt to Rm [id number].  Pt has signed Consent to Release Information.  She is under IVC initiated by the EDP, and IVC paperwork, along with signed form has been faxed to [facility].  Pt's nurse, Nicoletta Dress, has been notified, and agrees to send original paperwork along with pt via [employer], and to call report to [phone].  Jalene Mullet, MA  Triage Specialist  [phone]

## 2014-08-17 NOTE — Tx Team (Addendum)
Initial Interdisciplinary Treatment Plan   PATIENT STRESSORS: Health problems Occupational concerns Substance abuse   PATIENT STRENGTHS: Ability for insight Average or above average intelligence Capable of independent living Communication skills Financial means General fund of knowledge Motivation for treatment/growth Supportive family/friends   PROBLEM LIST: Problem List/Patient Goals Date to be addressed Date deferred Reason deferred Estimated date of resolution  "suicide thoughts" 08/17/2014   D/c        "substance abuse" 08/17/2014   D/c        "depression" 08/17/2014   D/c  "I need help with my depression" 08/18/2014                        DISCHARGE CRITERIA:  Ability to meet basic life and health needs Adequate post-discharge living arrangements Improved stabilization in mood, thinking, and/or behavior Medical problems require only outpatient monitoring Motivation to continue treatment in a less acute level of care Need for constant or close observation no longer present Reduction of life-threatening or endangering symptoms to within safe limits Safe-care adequate arrangements made Verbal commitment to aftercare and medication compliance Withdrawal symptoms are absent or subacute and managed without 24-hour nursing intervention  PRELIMINARY DISCHARGE PLAN: Attend aftercare/continuing care group Attend PHP/IOP Attend 12-step recovery group Outpatient therapy Return to previous living arrangement Return to previous work or school arrangements  PATIENT/FAMIILY INVOLVEMENT: This treatment plan has been presented to and reviewed with the patient, [name].  The patient and family have been given the opportunity to ask questions and make suggestions.  Cammy Copa 08/17/2014, 4:50 PM

## 2014-08-18 DIAGNOSIS — F1020 Alcohol dependence, uncomplicated: Principal | ICD-10-CM

## 2014-08-18 MED ORDER — GUAIFENESIN 100 MG/5ML PO SOLN
10.0000 mL | ORAL | Status: DC | PRN
  Administered 2014-08-18 – 2014-08-19 (×5): 200 mg via ORAL
  Filled 2014-08-18 (×5): qty 10

## 2014-08-18 NOTE — [facility] Group Notes (Signed)
[facility] Group Notes:  (Nursing/MHT/Case Management/Adjunct)  Date:  08/18/2014  Time:  11:08 AM  Type of Therapy:  Psychoeducational Skills  Participation Level:  Did Not Attend  Participation Quality:  Did not attend  Affect:  did not attend  Cognitive:  did not attend  Insight:  None  Engagement in Group:  None  Modes of Intervention:  Discussion and Education  Summary of Progress/Problems: This group was to discuss the topic of the day which is healthy coping skills. Patient was invited but did not attend.

## 2014-08-18 NOTE — Progress Notes (Signed)
Patient ID: [name], female   DOB: Jan 02, 1956, 59 y.o.   MRN: [id number]  DAR: Pt. Denies SI/HI and A/V Hallucinations. Patient reports sleep was poor, appetite is fair, energy level is low, and concentration is poor. Patient rates her depression, anxiety, and hopelessness at 10/10. Patient reports pain in left arm. Patient has cast on left arm that appears clean, dry, and intact. Support and encouragement provided to the patient. Scheduled medications administered to patient per physician's orders. Patient received PRN medications throughout the day for cough, anxiety, nausea and pain. Patient reported relief on reassessment. Patient is receptive and cooperative with Probation officer. Patient appears to blame herself and reports, "this is my fault." Patient is seen crying and offered 1:1 therapeutic conversation and listening. Q15 minute checks are maintained for safety.
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
## 2014-08-18 NOTE — [facility] Group Notes (Signed)
[facility] Group Notes: (Clinical Social Work)   08/18/2014      Type of Therapy:  Group Therapy   Participation Level:  Did Not Attend despite MHT prompting   Selmer Dominion, LCSW 08/18/2014, 2:38 PM

## 2014-08-18 NOTE — Progress Notes (Signed)
[facility] MD Progress Note  08/18/2014 1:25 PM [name]  MRN:  [id number]  Subjective: [name]  Is currently receiving alcohol detoxification treatments. She is visible on the unit. She is tearful & feels regretful for her chronic alcoholism. She says that is why she wanted to die, to not deal with another relapse or drink. Says her wanting to die is not as a result of sadness or depression, rather as a result of alcoholism. Says she does not know how to stop. She is endorsing cravings. Says if she is not presently here, she would be out there drinking or looking for something to drink. [name] keeps to herself. The dressing to her self inflicted laceration to left arm is intact.   Principal Problem: Alcohol use disorder, severe, dependence Diagnosis:   Patient Active Problem List   Diagnosis Date Noted  . Alcohol use disorder, severe, dependence [F10.20] 08/17/2014  . Alcohol abuse [F10.10]   . Suicidal behavior [F48.9]   . Wrist laceration [S61.519A]   . Alcohol dependence [F10.20] 10/02/2013  . Alcohol withdrawal [F10.239] 10/02/2013  . Major depression, recurrent [F33.9] 10/02/2013  . Gastric ulcer [K25.9] 01/20/2012  . Fatty liver [K76.0] 10/11/2011  . 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 Elevated liver function tests [R79.89]   . GERD (gastroesophageal reflux disease) [K21.9]   . 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 HIATAL HERNIA [K44.9] 10/01/2008  . FATTY LIVER DISEASE [K76.89] 10/01/2008   Total Time spent with patient: 35 minutes  Past Medical History:  Past Medical History  Diagnosis Date  . GERD (gastroesophageal reflux disease)   . Rosacea   . Elevated liver function tests   . Wears glasses   . Chronic diarrhea   . Alcohol abuse     H/o withdrawal   . PONV (postoperative nausea and vomiting)   . Pancreatitis   . Depression   . Anxiety     Past Surgical History  Procedure Laterality Date  . Cholecystectomy    . Shoulder surgery  [id number]   . Diagnostic mammogram  2008  . Tubal ligation    . Colonoscopy  Never  . Esophagogastroduodenoscopy  03/21/2011    Procedure: ESOPHAGOGASTRODUODENOSCOPY (EGD);  Surgeon: Yancey Flemings, MD;  Location[facility] ENDOSCOPY;  Service: Endoscopy;  Laterality: N/A;  Patient may need to be done at bedside tomorrow depending on status  . Ercp  10/06/2011    Procedure: ENDOSCOPIC RETROGRADE CHOLANGIOPANCREATOGRAPHY (ERCP);  Surgeon: Theda Belfast, MD;  Location: [facility] ENDOSCOPY;  Service: Endoscopy;  Laterality: N/A;  . Laparotomy  01/03/2012    Procedure: EXPLORATORY LAPAROTOMY;  Surgeon: Valarie Merino, MD;  Location: [facility] ORS;  Service: General;  Laterality: N/A;  debridement of necrotic pancreas and placement of drains x2  . Esophagogastroduodenoscopy  01/06/2012    Procedure: ESOPHAGOGASTRODUODENOSCOPY (EGD);  Surgeon: Graylin Shiver, MD;  Location: [facility] ENDOSCOPY;  Service: Endoscopy;  Laterality: N/A;  bedside   Family History:  Family History  Problem Relation Age of Onset  . Stroke Mother   . Heart disease Father   . Heart failure Father   . Heart disease Sister    Social History:  History  Alcohol Use  . 1.5 - 2.0 oz/week  . 3-4 Standard drinks or equivalent per week    Comment: couple bottles wine daily when not workking, M-F when working few beers     History  Drug Use  . Yes  . Special: Marijuana    Comment: THC tiny bowls on week-ends  History   Social History  . Marital Status: Divorced    Spouse Name: N/A  . Number of Children: N/A  . Years of Education: N/A   Social History Main Topics  . Smoking status: Former Smoker -- 1.00 packs/day for 15 years    Quit date: 09/22/1978  . Smokeless tobacco: Never Used  . Alcohol Use: 1.5 - 2.0 oz/week    3-4 Standard drinks or equivalent per week     Comment: couple bottles wine daily when not workking, M-F when working few beers  . Drug Use: Yes    Special: Marijuana     Comment: THC tiny bowls on week-ends  . Sexual Activity: No      Comment: Works in data entry and accounting at [employer]   Other Topics Concern  . None   Social History Narrative   Has a son and some friends with whom she is close. Was in rehab in 09/2010 and was sober for a few months after.    Additional History:    Sleep: Fair  Appetite:  Good   Assessment:   Musculoskeletal: Strength & Muscle Tone: within normal limits Gait & Station: normal Patient leans: N/A  Psychiatric Specialty Exam: Physical Exam  Review of Systems  Constitutional: Positive for malaise/fatigue.  HENT: Negative.   Eyes: Negative.   Respiratory: Negative.   Cardiovascular: Negative.   Gastrointestinal: Positive for nausea.  Genitourinary: Negative.   Musculoskeletal: Positive for myalgias.  Skin: Negative.   Neurological: Positive for dizziness and weakness.  Endo/Heme/Allergies: Negative.   Psychiatric/Behavioral: Positive for depression and substance abuse (Alcoholism). Negative for suicidal ideas, hallucinations and memory loss. The patient is nervous/anxious and has insomnia.     Blood pressure 155/80, pulse 89, temperature 98.6 F (37 C), temperature source Oral, resp. rate 20, height $RemoveBe'5\' 6"'VLjnZdPuG$  (1.676 m), weight 69.4 kg (153 lb), last menstrual period 12/08/2006, SpO2 100 %.Body mass index is 24.71 kg/(m^2).  General Appearance: Casual  Eye Contact::  Good  Speech:  Clear and Coherent  Volume:  Normal  Mood:  Depressed and Hopeless  Affect:  Congruent and Flat  Thought Process:  Coherent  Orientation:  Full (Time, Place, and Person)  Thought Content:  Rumination  Suicidal Thoughts:  No  Homicidal Thoughts:  No  Memory:  Grossly intact  Judgement:  Fair  Insight:  Fair  Psychomotor Activity:  Normal  Concentration:  Fair  Recall:  Good  Fund of Knowledge:Fair  Language: Good  Akathisia:  No  Handed:  Right  AIMS (if indicated):     Assets:  Desire for Improvement  ADL's:  Intact  Cognition: WNL  Sleep:  Number of Hours: 6.75      Current Medications: Current Facility-Administered Medications  Medication Dose Route Frequency Provider Last Rate Last Dose  . acetaminophen (TYLENOL) tablet 650 mg  650 mg Oral Q6H PRN Delfin Gant, NP      . albuterol (PROVENTIL HFA;VENTOLIN HFA) 108 (90 BASE) MCG/ACT inhaler 2 puff  2 puff Inhalation Q6H PRN Delfin Gant, NP      . alum & mag hydroxide-simeth (MAALOX/MYLANTA) 200-200-20 MG/5ML suspension 30 mL  30 mL Oral Q4H PRN Delfin Gant, NP      . FLUoxetine (PROZAC) capsule 20 mg  20 mg Oral Daily Delfin Gant, NP   20 mg at 08/18/14 0813  . gabapentin (NEURONTIN) capsule 300 mg  300 mg Oral TID Delfin Gant, NP   300 mg at 08/18/14 1125  .  guaiFENesin (ROBITUSSIN) 100 MG/5ML solution 200 mg  10 mL Oral Q4H PRN Nicholaus Bloom, MD   200 mg at 08/18/14 0818  . hydrOXYzine (ATARAX/VISTARIL) tablet 25 mg  25 mg Oral Q6H PRN Harriet Butte, NP      . ibuprofen (ADVIL,MOTRIN) tablet 600 mg  600 mg Oral Q6H PRN Harriet Butte, NP   600 mg at 08/18/14 2993  . lipase/protease/amylase (CREON) capsule 12,000 Units  12,000 Units Oral TID WC Delfin Gant, NP   12,000 Units at 08/18/14 1125  . loperamide (IMODIUM) capsule 2-4 mg  2-4 mg Oral PRN Harriet Butte, NP      . LORazepam (ATIVAN) tablet 1 mg  1 mg Oral Q6H PRN Harriet Butte, NP   1 mg at 08/17/14 2103  . LORazepam (ATIVAN) tablet 1 mg  1 mg Oral QID Harriet Butte, NP   1 mg at 08/18/14 1126   Followed by  . [START ON 08/19/2014] LORazepam (ATIVAN) tablet 1 mg  1 mg Oral TID Harriet Butte, NP       Followed by  . [START ON 08/20/2014] LORazepam (ATIVAN) tablet 1 mg  1 mg Oral BID Harriet Butte, NP       Followed by  . [START ON 08/22/2014] LORazepam (ATIVAN) tablet 1 mg  1 mg Oral Daily Harriet Butte, NP      . magnesium hydroxide (MILK OF MAGNESIA) suspension 30 mL  30 mL Oral Daily PRN Delfin Gant, NP      . multivitamin with minerals tablet 1 tablet  1 tablet Oral Daily Harriet Butte, NP   1 tablet at 08/18/14 0813  . nicotine (NICODERM CQ - dosed in mg/24 hours) patch 21 mg  21 mg Transdermal Daily Delfin Gant, NP   21 mg at 08/18/14 0813  . ondansetron (ZOFRAN-ODT) disintegrating tablet 4 mg  4 mg Oral Q6H PRN Harriet Butte, NP      . thiamine (VITAMIN B-1) tablet 100 mg  100 mg Oral Daily Harriet Butte, NP   100 mg at 08/18/14 0814  . traZODone (DESYREL) tablet 50 mg  50 mg Oral QHS PRN Delfin Gant, NP   50 mg at 08/17/14 2103    Lab Results:  Results for orders placed or performed during the hospital encounter of 08/16/14 (from the past 48 hour(s))  Urine rapid drug screen (hosp performed)     Status: Abnormal   Collection Time: 08/16/14  7:21 PM  Result Value Ref Range   Opiates NONE DETECTED NONE DETECTED   Cocaine NONE DETECTED NONE DETECTED   Benzodiazepines POSITIVE (A) NONE DETECTED   Amphetamines NONE DETECTED NONE DETECTED   Tetrahydrocannabinol POSITIVE (A) NONE DETECTED   Barbiturates NONE DETECTED NONE DETECTED    Comment:        DRUG SCREEN FOR MEDICAL PURPOSES ONLY.  IF CONFIRMATION IS NEEDED FOR ANY PURPOSE, NOTIFY LAB WITHIN 5 DAYS.        LOWEST DETECTABLE LIMITS FOR URINE DRUG SCREEN Drug Class       Cutoff (ng/mL) Amphetamine      1000 Barbiturate      200 Benzodiazepine   716 Tricyclics       967 Opiates          300 Cocaine          300 THC              50   CBC  with Differential/Platelet     Status: Abnormal   Collection Time: 08/16/14  8:23 PM  Result Value Ref Range   WBC 10.9 (H) 4.0 - 10.5 K/uL   RBC 4.33 3.87 - 5.11 MIL/uL   Hemoglobin 14.2 12.0 - 15.0 g/dL   HCT 42.5 36.0 - 46.0 %   MCV 98.2 78.0 - 100.0 fL   MCH 32.8 26.0 - 34.0 pg   MCHC 33.4 30.0 - 36.0 g/dL   RDW 12.9 11.5 - 15.5 %   Platelets 235 150 - 400 K/uL   Neutrophils Relative % 66 43 - 77 %   Neutro Abs 7.2 1.7 - 7.7 K/uL   Lymphocytes Relative 25 12 - 46 %   Lymphs Abs 2.7 0.7 - 4.0 K/uL   Monocytes Relative 7 3 - 12 %    Monocytes Absolute 0.8 0.1 - 1.0 K/uL   Eosinophils Relative 2 0 - 5 %   Eosinophils Absolute 0.2 0.0 - 0.7 K/uL   Basophils Relative 0 0 - 1 %   Basophils Absolute 0.0 0.0 - 0.1 K/uL  Basic metabolic panel     Status: None   Collection Time: 08/16/14  8:23 PM  Result Value Ref Range   Sodium 138 135 - 145 mmol/L   Potassium 3.9 3.5 - 5.1 mmol/L   Chloride 108 96 - 112 mmol/L   CO2 20 19 - 32 mmol/L   Glucose, Bld 86 70 - 99 mg/dL   BUN 13 6 - 23 mg/dL   Creatinine, Ser 0.56 0.50 - 1.10 mg/dL   Calcium 8.6 8.4 - 10.5 mg/dL   GFR calc non Af Amer >90 >90 mL/min   GFR calc Af Amer >90 >90 mL/min    Comment: (NOTE) The eGFR has been calculated using the CKD EPI equation. This calculation has not been validated in all clinical situations. eGFR's persistently <90 mL/min signify possible Chronic Kidney Disease.    Anion gap 10 5 - 15  Acetaminophen level     Status: Abnormal   Collection Time: 08/16/14  8:23 PM  Result Value Ref Range   Acetaminophen (Tylenol), Serum <10.0 (L) 10 - 30 ug/mL    Comment:        THERAPEUTIC CONCENTRATIONS VARY SIGNIFICANTLY. A RANGE OF 10-30 ug/mL MAY BE AN EFFECTIVE CONCENTRATION FOR MANY PATIENTS. HOWEVER, SOME ARE BEST TREATED AT CONCENTRATIONS OUTSIDE THIS RANGE. ACETAMINOPHEN CONCENTRATIONS >150 ug/mL AT 4 HOURS AFTER INGESTION AND >50 ug/mL AT 12 HOURS AFTER INGESTION ARE OFTEN ASSOCIATED WITH TOXIC REACTIONS.   Salicylate level     Status: None   Collection Time: 08/16/14  8:23 PM  Result Value Ref Range   Salicylate Lvl <1.6 2.8 - 20.0 mg/dL  Ethanol     Status: Abnormal   Collection Time: 08/16/14  8:23 PM  Result Value Ref Range   Alcohol, Ethyl (B) 208 (H) 0 - 9 mg/dL    Comment:        LOWEST DETECTABLE LIMIT FOR SERUM ALCOHOL IS 11 mg/dL FOR MEDICAL PURPOSES ONLY     Physical Findings: AIMS: Facial and Oral Movements Muscles of Facial Expression: None, normal Lips and Perioral Area: None, normal Jaw: None,  normal Tongue: None, normal,Extremity Movements Upper (arms, wrists, hands, fingers): None, normal Lower (legs, knees, ankles, toes): None, normal, Trunk Movements Neck, shoulders, hips: None, normal, Overall Severity Severity of abnormal movements (highest score from questions above): None, normal Incapacitation due to abnormal movements: None, normal Patient's awareness of abnormal movements (rate only patient's report):  No Awareness, Dental Status Current problems with teeth and/or dentures?: No Does patient usually wear dentures?: No  CIWA:  CIWA-Ar Total: 5 COWS:  COWS Total Score: 3  Treatment Plan Summary: Daily contact with patient to assess and evaluate symptoms and progress in treatment and Medication management:  Plan: Supportive approach/coping skills/relapse prevention           Depression/OCD: Continue Prozac, insomnia; continue Trazodone 50 mg, Vistaril 25 mg prn for anxiety/tension, Agitation/substance withdrawal syndrome, continue Neurontin 300 mg.  Continue to work with mindfulness, CBT help  identify the cognitive distortions that keep the depression going.          Discuss other life style changes that can help with both his depression and her alcohol use such like exercise, meditation          Continue alcohol detoxification treatment.  Will use motivational interviewing and encourage to medication management & counseling services while inpatient & after discharge.                Continue to educate on the effects of substance abuse and other recovery strategies.  Medical Decision Making:  Established Problem, Stable/Improving (1), Review of Medication Regimen & Side Effects (2) and Review of New Medication or Change in Dosage (2)  Encarnacion Slates, PMHNP, FNP-BC 08/18/2014, 1:25 PM   Reviewed the information documented and agree with the treatment plan.  Roby Donaway,JANARDHAHA R. 08/18/2014 4:40 PM

## 2014-08-18 NOTE — [facility] Group Notes (Signed)
The focus of this group is to educate the patient on the purpose and policies of crisis stabilization and provide a format to answer questions about their admission.  The group details unit policies and expectations of patients while admitted.  Patient was invited to group but chose not to attend.

## 2014-08-18 NOTE — Plan of Care (Signed)
Problem: Diagnosis: Increased Risk For Suicide Attempt Goal: STG-Patient Will Comply With Medication Regime Outcome: Progressing Patient compliant with medication regime.

## 2014-08-19 MED ORDER — LORAZEPAM 1 MG PO TABS: 1.0000 mg | ORAL_TABLET | Freq: Four times a day (QID) | ORAL | Status: AC | PRN

## 2014-08-19 MED ORDER — IBUPROFEN 600 MG PO TABS
600.0000 mg | ORAL_TABLET | ORAL | Status: DC | PRN
  Administered 2014-08-19 – 2014-08-21 (×8): 600 mg via ORAL
  Filled 2014-08-19 (×8): qty 1

## 2014-08-19 MED ORDER — FLUOXETINE HCL 20 MG PO CAPS
40.0000 mg | ORAL_CAPSULE | Freq: Every day | ORAL | Status: DC
  Administered 2014-08-20 – 2014-08-21 (×2): 40 mg via ORAL
  Filled 2014-08-19: qty 8
  Filled 2014-08-19 (×3): qty 2

## 2014-08-19 MED ORDER — HYDROXYZINE HCL 50 MG PO TABS
50.0000 mg | ORAL_TABLET | Freq: Four times a day (QID) | ORAL | Status: AC | PRN
  Administered 2014-08-19 – 2014-08-20 (×3): 50 mg via ORAL
  Filled 2014-08-19 (×3): qty 1

## 2014-08-19 MED ORDER — QUETIAPINE FUMARATE 25 MG PO TABS
25.0000 mg | ORAL_TABLET | Freq: Two times a day (BID) | ORAL | Status: DC
  Administered 2014-08-19 – 2014-08-21 (×4): 25 mg via ORAL
  Filled 2014-08-19: qty 1
  Filled 2014-08-19: qty 8
  Filled 2014-08-19 (×5): qty 1
  Filled 2014-08-19: qty 8

## 2014-08-19 MED ORDER — GABAPENTIN 300 MG PO CAPS
300.0000 mg | ORAL_CAPSULE | Freq: Three times a day (TID) | ORAL | Status: DC
  Administered 2014-08-19 – 2014-08-20 (×4): 300 mg via ORAL
  Filled 2014-08-19 (×11): qty 1

## 2014-08-19 NOTE — [facility] Group Notes (Signed)
[facility] Group Notes:  (Clinical Social Work)  08/19/2014  10:00-11:00AM  Summary of Progress/Problems:   The main focus of today's process group was to   1)  discuss the importance of adding supports  2)  define health supports versus unhealthy supports  3)  identify the patient's current unhealthy supports and plan how to handle them  4)  Identify the patient's current healthy supports and plan what to add.  An emphasis was placed on using counselor, doctor, therapy groups, 12-step groups, and problem-specific support groups to expand supports.    The patient expressed full comprehension of the concepts presented, and agreed that there is a need to add more supports.  The patient stated her job is her healthy support, while drugs/alcohol are the unhealthy supports in her life.  She talked about making it to 15 years of sobriety without working the [employer], by being her own higher power, using willpower.  She was able to admit that although it lasted 15 years willpower alone was not enough support.  She stated going to [place] meetings is depressing, and was open to discussion about dual diagnosis, needing to treat both depression with therapy/medication/support groups and addiction with AA/sponsor/other program.  Type of Therapy:  Process Group with Motivational Interviewing  Participation Level:  Active  Participation Quality:  Appropriate, Attentive, Sharing and Supportive  Affect:  Blunted and Depressed  Cognitive:  Alert, Appropriate and Oriented  Insight:  Engaged  Engagement in Therapy:  Engaged  Modes of Intervention:   Education, Support and Processing, Activity  Selmer Dominion, LCSW 08/19/2014

## 2014-08-19 NOTE — Progress Notes (Signed)
Pt attended [place] group with guest speaker @ 20:00

## 2014-08-19 NOTE — Progress Notes (Signed)
D:  Pt. Appears withdrawn and anxious this am.  Denies H/I,S/I, or A/V hallucinations.  Interacted with peers in Group yet complained of anxiety after group and became tearful.  A:  Medicated pt. Per MD orders, including prn for anxiety.  Gave emotional support to Pt, insuring safety on unit.  Continued with fifteen minute checks.  R:  Pt. Remains safe on unit.  Up and interacting in milieu.  Verbalized "decrease in anxiety"

## 2014-08-19 NOTE — Progress Notes (Signed)
[facility] MD Progress Note  08/19/2014 12:02 PM Zenith Kercheval  MRN:  [id number]  Subjective: [name]  Is currently receiving alcohol detoxification treatments. She is visible on the unit. She remains tearful & feels regretful for her chronic alcoholism. Says she does not know how to stop. She is endorsing cravings. Says if she is not presently here, she would be out there drinking or looking for something to drink. Terrian keeps to herself. The dressing to her self inflicted laceration to left arm is intact.  Today, she feels she is having anxiety attacks.  Reports that she has been taking Prozac for over 90 days and is not working.    Principal Problem: Alcohol use disorder, severe, dependence Diagnosis:   Patient Active Problem List   Diagnosis Date Noted  . Alcohol use disorder, severe, dependence [F10.20] 08/17/2014  . Alcohol abuse [F10.10]   . Suicidal behavior [F48.9]   . Wrist laceration [S61.519A]   . Alcohol dependence [F10.20] 10/02/2013  . Alcohol withdrawal [F10.239] 10/02/2013  . Major depression, recurrent [F33.9] 10/02/2013  . Gastric ulcer [K25.9] 01/20/2012  . Fatty liver [K76.0] 10/11/2011  . 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 Elevated liver function tests [R79.89]   . GERD (gastroesophageal reflux disease) [K21.9]   . 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 HIATAL HERNIA [K44.9] 10/01/2008  . FATTY LIVER DISEASE [K76.89] 10/01/2008   Total Time spent with patient: 35 minutes  Past Medical History:  Past Medical History  Diagnosis Date  . GERD (gastroesophageal reflux disease)   . Rosacea   . Elevated liver function tests   . Wears glasses   . Chronic diarrhea   . Alcohol abuse     H/o withdrawal   . PONV (postoperative nausea and vomiting)   . Pancreatitis   . Depression   . Anxiety     Past Surgical History  Procedure Laterality Date  . Cholecystectomy    . Shoulder surgery  [id number]  . Diagnostic mammogram  2008  . Tubal ligation     . Colonoscopy  Never  . Esophagogastroduodenoscopy  03/21/2011    Procedure: ESOPHAGOGASTRODUODENOSCOPY (EGD);  Surgeon: Scarlette Shorts, MD;  Location: [facility] ENDOSCOPY;  Service: Endoscopy;  Laterality: N/A;  Patient may need to be done at bedside tomorrow depending on status  . Ercp  10/06/2011    Procedure: ENDOSCOPIC RETROGRADE CHOLANGIOPANCREATOGRAPHY (ERCP);  Surgeon: Beryle Beams, MD;  Location: [facility] ENDOSCOPY;  Service: Endoscopy;  Laterality: N/A;  . Laparotomy  01/03/2012    Procedure: EXPLORATORY LAPAROTOMY;  Surgeon: Pedro Earls, MD;  Location: [facility] ORS;  Service: General;  Laterality: N/A;  debridement of necrotic pancreas and placement of drains x2  . Esophagogastroduodenoscopy  01/06/2012    Procedure: ESOPHAGOGASTRODUODENOSCOPY (EGD);  Surgeon: Wonda Horner, MD;  Location: [facility] ENDOSCOPY;  Service: Endoscopy;  Laterality: N/A;  bedside   Family History:  Family History  Problem Relation Age of Onset  . Stroke Mother   . Heart disease Father   . Heart failure Father   . Heart disease Sister    Social History:  History  Alcohol Use  . 1.5 - 2.0 oz/week  . 3-4 Standard drinks or equivalent per week    Comment: couple bottles wine daily when not workking, M-F when working few beers     History  Drug Use  . Yes  . Special: Marijuana    Comment: THC tiny bowls on week-ends    History   Social History  . Marital Status: Divorced  Spouse Name: N/A  . Number of Children: N/A  . Years of Education: N/A   Social History Main Topics  . Smoking status: Former Smoker -- 1.00 packs/day for 15 years    Quit date: 09/22/1978  . Smokeless tobacco: Never Used  . Alcohol Use: 1.5 - 2.0 oz/week    3-4 Standard drinks or equivalent per week     Comment: couple bottles wine daily when not workking, M-F when working few beers  . Drug Use: Yes    Special: Marijuana     Comment: THC tiny bowls on week-ends  . 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   Other Topics Concern  . None   Social History Narrative   Has a son and some friends with whom she is close. Was in rehab in 09/2010 and was sober for a few months after.    Additional History:    Sleep: Fair  Appetite:  Good   Assessment:   Musculoskeletal: Strength & Muscle Tone: within normal limits Gait & Station: normal Patient leans: N/A  Psychiatric Specialty Exam: Physical Exam  Vitals reviewed. Psychiatric: She has a normal mood and affect.    Review of Systems  Psychiatric/Behavioral: Positive for depression. The patient is nervous/anxious.     Blood pressure 133/79, pulse 85, temperature 98.2 F (36.8 C), temperature source Oral, resp. rate 20, height '5\' 6"'$  (1.676 m), weight 69.4 kg (153 lb), last menstrual period 12/08/2006, SpO2 100 %.Body mass index is 24.71 kg/(m^2).  General Appearance: Casual  Eye Contact::  Good  Speech:  Clear and Coherent  Volume:  Normal  Mood:  Depressed and Hopeless  Affect:  Congruent and Flat  Thought Process:  Coherent  Orientation:  Full (Time, Place, and Person)  Thought Content:  Rumination  Suicidal Thoughts:  No  Homicidal Thoughts:  No  Memory:  Grossly intact  Judgement:  Fair  Insight:  Fair  Psychomotor Activity:  Normal  Concentration:  Fair  Recall:  Good  Fund of Knowledge:Fair  Language: Good  Akathisia:  No  Handed:  Right  AIMS (if indicated):     Assets:  Desire for Improvement  ADL's:  Intact  Cognition: WNL  Sleep:  Number of Hours: 6.5     Current Medications: Current Facility-Administered Medications  Medication Dose Route Frequency Provider Last Rate Last Dose  . acetaminophen (TYLENOL) tablet 650 mg  650 mg Oral Q6H PRN Delfin Gant, NP   650 mg at 08/19/14 0617  . albuterol (PROVENTIL HFA;VENTOLIN HFA) 108 (90 BASE) MCG/ACT inhaler 2 puff  2 puff Inhalation Q6H PRN Delfin Gant, NP      . alum & mag hydroxide-simeth (MAALOX/MYLANTA) 200-200-20 MG/5ML suspension  30 mL  30 mL Oral Q4H PRN Delfin Gant, NP      . FLUoxetine (PROZAC) capsule 20 mg  20 mg Oral Daily Delfin Gant, NP   20 mg at 08/19/14 0827  . gabapentin (NEURONTIN) capsule 300 mg  300 mg Oral TID Delfin Gant, NP   300 mg at 08/19/14 1134  . guaiFENesin (ROBITUSSIN) 100 MG/5ML solution 200 mg  10 mL Oral Q4H PRN Nicholaus Bloom, MD   200 mg at 08/19/14 0853  . hydrOXYzine (ATARAX/VISTARIL) tablet 25 mg  25 mg Oral Q6H PRN Harriet Butte, NP   25 mg at 08/19/14 0945  . ibuprofen (ADVIL,MOTRIN) tablet 600 mg  600 mg Oral Q4H PRN  Kerrie Buffalo, NP      . lipase/protease/amylase (CREON) capsule 12,000 Units  12,000 Units Oral TID WC Delfin Gant, NP   12,000 Units at 08/19/14 1134  . loperamide (IMODIUM) capsule 2-4 mg  2-4 mg Oral PRN Harriet Butte, NP      . LORazepam (ATIVAN) tablet 1 mg  1 mg Oral Q6H PRN Harriet Butte, NP   1 mg at 08/17/14 2103  . LORazepam (ATIVAN) tablet 1 mg  1 mg Oral TID Harriet Butte, NP   1 mg at 08/19/14 1135   Followed by  . [START ON 08/20/2014] LORazepam (ATIVAN) tablet 1 mg  1 mg Oral BID Harriet Butte, NP       Followed by  . [START ON 08/22/2014] LORazepam (ATIVAN) tablet 1 mg  1 mg Oral Daily Harriet Butte, NP      . magnesium hydroxide (MILK OF MAGNESIA) suspension 30 mL  30 mL Oral Daily PRN Delfin Gant, NP      . multivitamin with minerals tablet 1 tablet  1 tablet Oral Daily Harriet Butte, NP   1 tablet at 08/19/14 0827  . nicotine (NICODERM CQ - dosed in mg/24 hours) patch 21 mg  21 mg Transdermal Daily Delfin Gant, NP   21 mg at 08/19/14 0828  . ondansetron (ZOFRAN-ODT) disintegrating tablet 4 mg  4 mg Oral Q6H PRN Harriet Butte, NP   4 mg at 08/18/14 1634  . thiamine (VITAMIN B-1) tablet 100 mg  100 mg Oral Daily Harriet Butte, NP   100 mg at 08/19/14 0827  . traZODone (DESYREL) tablet 50 mg  50 mg Oral QHS PRN Delfin Gant, NP   50 mg at 08/18/14 2115    Lab Results:  No results found for  this or any previous visit (from the past 48 hour(s)).  Physical Findings: AIMS: Facial and Oral Movements Muscles of Facial Expression: None, normal Lips and Perioral Area: None, normal Jaw: None, normal Tongue: None, normal,Extremity Movements Upper (arms, wrists, hands, fingers): None, normal Lower (legs, knees, ankles, toes): None, normal, Trunk Movements Neck, shoulders, hips: None, normal, Overall Severity Severity of abnormal movements (highest score from questions above): None, normal Incapacitation due to abnormal movements: None, normal Patient's awareness of abnormal movements (rate only patient's report): No Awareness, Dental Status Current problems with teeth and/or dentures?: No Does patient usually wear dentures?: No  CIWA:  CIWA-Ar Total: 2 COWS:  COWS Total Score: 3  Treatment Plan Summary: Daily contact with patient to assess and evaluate symptoms and progress in treatment and Medication management:  Plan: Supportive approach/coping skills/relapse prevention           Depression/OCD: Continue Prozac, insomnia; continue Trazodone 50 mg, Vistaril 25 mg prn for anxiety/tension, Agitation/substance withdrawal syndrome, continue Neurontin 300 mg.  Continue to work with mindfulness, CBT help  identify the cognitive distortions that keep the depression going.          Discuss other life style changes that can help with both his depression and her alcohol use such like exercise, meditation          Continue alcohol detoxification treatment.  Will use motivational interviewing and encourage to medication management & counseling services while inpatient & after discharge.                Continue to educate on the effects of substance abuse and other recovery strategies.  Medical Decision Making:  Established Problem, Stable/Improving (  1), Review of Medication Regimen & Side Effects (2) and Review of New Medication or Change in Dosage (2)  Janett Labella,  AGNP-BC  Reviewed the information documented and agree with the treatment plan.  [place] Churchill,JANARDHAHA R. 08/23/2014 5:15 PM

## 2014-08-19 NOTE — Progress Notes (Signed)
D: Pt has anxious, depressed affect and mood.  Pt reports her goal tonight was "not to cry."  Pt reports she had a good visit with her boyfriend and reported that he is "my only friend."  Pt denies SI/HI, denies hallucinations.  Pt reported left hand pain of 8/10.  Pt reports "I had 6 stitches."   Pt has been visible in milieu with minimal peer interaction.   Pt attended evening group.  Pt was coloring in the day room when group was over.  Pt expressed concern related to getting follow up instructions from ED. A: Introduced self to pt.  Met with pt 1:1 and provided support and encouragement.  Actively listened to pt.  Follow up instructions from ED located in pt's paper chart and copy was made and given to pt.  Medications administered per order.  PRN medication administered for anxiety, sleep, and pain. R: Pt is compliant with medications.  Pt verbally contracts for safety.  Will continue to monitor and assess.

## 2014-08-19 NOTE — [facility] Group Notes (Signed)
The focus of this group is to educate the patient on the purpose and policies of crisis stabilization and provide a format to answer questions about their admission.  The group details unit policies and expectations of patients while admitted.  Patient attended 0900 nurse education orientation group this morning.  Patient actively participated, appropriate affect, alert, appropriate insight and engagement.  Today patient will work on 3 goals for discharge.  

## 2014-08-19 NOTE — Plan of Care (Signed)
Problem: Consults Goal: Depression Patient Education See Patient Education Module for education specifics.  Outcome: Completed/Met Date Met:  08/19/14 Nurse discussed depression/coping skills with patient.     

## 2014-08-19 NOTE — Plan of Care (Signed)
Problem: Alteration in mood & ability to function due to Goal: LTG-Pt reports reduction in suicidal thoughts (Patient reports reduction in suicidal thoughts and is able to verbalize a safety plan for whenever patient is feeling suicidal)  Outcome: Progressing Pt denied SI this shift.  She verbally contracted for safety.  Goal: STG-Patient will attend groups Outcome: Progressing Pt attended evening group on 08/18/14.

## 2014-08-20 MED ORDER — GABAPENTIN 400 MG PO CAPS
400.0000 mg | ORAL_CAPSULE | Freq: Three times a day (TID) | ORAL | Status: DC
  Administered 2014-08-20 – 2014-08-21 (×4): 400 mg via ORAL
  Filled 2014-08-20 (×2): qty 1
  Filled 2014-08-20: qty 16
  Filled 2014-08-20 (×2): qty 1
  Filled 2014-08-20: qty 16
  Filled 2014-08-20 (×2): qty 1
  Filled 2014-08-20: qty 16
  Filled 2014-08-20: qty 1
  Filled 2014-08-20: qty 16

## 2014-08-20 NOTE — Progress Notes (Signed)
[name] MD Progress Note  08/20/2014 5:42 PM [name]  MRN:  [id number] Subjective:  [name] states that she does not think she has to go to a residential treatment program. States that looking back the ongoing issue is the unresolved grief having to do with the death of her mother in 01-03-2023 and her not having been there for her at the end given her dementia and not recognizing her after she was not only her daughter but also her care given for 12 years or so. She also admits that alcohol has been involved in all her suicidal attempts.  Principal Problem: Alcohol use disorder, severe, dependence Diagnosis:   Patient Active Problem List   Diagnosis Date Noted  . Alcohol dependence [F10.20] 10/02/2013    Priority: High  . Alcohol withdrawal [F10.239] 10/02/2013    Priority: High  . Major depression, recurrent [F33.9] 10/02/2013    Priority: High  . Alcohol use disorder, severe, dependence [F10.20] 08/17/2014  . Alcohol abuse [F10.10]   . Suicidal behavior [F48.9]   . Wrist laceration [S61.519A]   . Gastric ulcer [K25.9] 01/20/2012  . Fatty liver [K76.0] 10/11/2011  . 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 Elevated liver function tests [R79.89]   . GERD (gastroesophageal reflux disease) [K21.9]   . 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 HIATAL HERNIA [K44.9] 10/01/2008  . FATTY LIVER DISEASE [K76.89] 10/01/2008   Total Time spent with patient: 30 minutes   Past Medical History:  Past Medical History  Diagnosis Date  . GERD (gastroesophageal reflux disease)   . Rosacea   . Elevated liver function tests   . Wears glasses   . Chronic diarrhea   . Alcohol abuse     H/o withdrawal   . PONV (postoperative nausea and vomiting)   . Pancreatitis   . Depression   . Anxiety     Past Surgical History  Procedure Laterality Date  . Cholecystectomy    . Shoulder surgery  [id number]  . Diagnostic mammogram  2008  . Tubal ligation    . Colonoscopy  Never  .  Esophagogastroduodenoscopy  03/21/2011    Procedure: ESOPHAGOGASTRODUODENOSCOPY (EGD);  Surgeon: Scarlette Shorts, MD;  Location: [facility] ENDOSCOPY;  Service: Endoscopy;  Laterality: N/A;  Patient may need to be done at bedside tomorrow depending on status  . Ercp  10/06/2011    Procedure: ENDOSCOPIC RETROGRADE CHOLANGIOPANCREATOGRAPHY (ERCP);  Surgeon: Beryle Beams, MD;  Location: [facility] ENDOSCOPY;  Service: Endoscopy;  Laterality: N/A;  . Laparotomy  01/03/2012    Procedure: EXPLORATORY LAPAROTOMY;  Surgeon: Pedro Earls, MD;  Location: [facility] ORS;  Service: General;  Laterality: N/A;  debridement of necrotic pancreas and placement of drains x2  . Esophagogastroduodenoscopy  01/06/2012    Procedure: ESOPHAGOGASTRODUODENOSCOPY (EGD);  Surgeon: Wonda Horner, MD;  Location: [facility] ENDOSCOPY;  Service: Endoscopy;  Laterality: N/A;  bedside   Family History:  Family History  Problem Relation Age of Onset  . Stroke Mother   . Heart disease Father   . Heart failure Father   . Heart disease Sister    Social History:  History  Alcohol Use  . 1.5 - 2.0 oz/week  . 3-4 Standard drinks or equivalent per week    Comment: couple bottles wine daily when not workking, M-F when working few beers     History  Drug Use  . Yes  . Special: Marijuana    Comment: THC tiny bowls on week-ends    History   Social History  .  Marital Status: Divorced    Spouse Name: N/A  . Number of Children: N/A  . Years of Education: N/A   Social History Main Topics  . Smoking status: Former Smoker -- 1.00 packs/day for 15 years    Quit date: 09/22/1978  . Smokeless tobacco: Never Used  . Alcohol Use: 1.5 - 2.0 oz/week    3-4 Standard drinks or equivalent per week     Comment: couple bottles wine daily when not workking, M-F when working few beers  . Drug Use: Yes    Special: Marijuana     Comment: THC tiny bowls on week-ends  . 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   Other Topics  Concern  . None   Social History Narrative   Has a son and some friends with whom she is close. Was in rehab in 09/2010 and was sober for a few months after.    Additional History:    Sleep: Fair  Appetite:  Fair   Assessment:   Musculoskeletal: Strength & Muscle Tone: within normal limits Gait & Station: normal Patient leans: N/A   Psychiatric Specialty Exam: Physical Exam  Review of Systems  Constitutional: Negative.   HENT: Negative.   Eyes: Negative.   Respiratory: Negative.   Cardiovascular: Negative.   Gastrointestinal: Negative.   Genitourinary: Negative.   Musculoskeletal:       Pain in her arm A/P self induced lacerations   Skin: Negative.   Neurological: Negative.   Endo/Heme/Allergies: Negative.   Psychiatric/Behavioral: Positive for depression and substance abuse. The patient is nervous/anxious.     Blood pressure 151/89, pulse 76, temperature 97.7 F (36.5 C), temperature source Oral, resp. rate 16, height '5\' 6"'$  (1.676 m), weight 69.4 kg (153 lb), last menstrual period 12/08/2006, SpO2 100 %.Body mass index is 24.71 kg/(m^2).  General Appearance: Fairly Groomed  Engineer, water::  Fair  Speech:  Clear and Coherent  Volume:  Normal  Mood:  Anxious and worried  Affect:  anxious worried  Thought Process:  Coherent and Goal Directed  Orientation:  Full (Time, Place, and Person)  Thought Content:  symptoms events worries concerns  Suicidal Thoughts:  No  Homicidal Thoughts:  No  Memory:  Immediate;   Fair Recent;   Fair Remote;   Fair  Judgement:  Fair  Insight:  Present  Psychomotor Activity:  Restlessness  Concentration:  Fair  Recall:  [employer] of Knowledge:Fair  Language: Fair  Akathisia:  No  Handed:  Right  AIMS (if indicated):     Assets:  Desire for Improvement Housing Social Support Vocational/Educational  ADL's:  Intact  Cognition: WNL  Sleep:  Number of Hours: 6.5     Current Medications: Current Facility-Administered  Medications  Medication Dose Route Frequency Provider Last Rate Last Dose  . acetaminophen (TYLENOL) tablet 650 mg  650 mg Oral Q6H PRN Delfin Gant, NP   650 mg at 08/20/14 0826  . albuterol (PROVENTIL HFA;VENTOLIN HFA) 108 (90 BASE) MCG/ACT inhaler 2 puff  2 puff Inhalation Q6H PRN Delfin Gant, NP      . alum & mag hydroxide-simeth (MAALOX/MYLANTA) 200-200-20 MG/5ML suspension 30 mL  30 mL Oral Q4H PRN Delfin Gant, NP      . FLUoxetine (PROZAC) capsule 40 mg  40 mg Oral Daily Ambrose Finland, MD   40 mg at 08/20/14 0818  . gabapentin (NEURONTIN) capsule 400 mg  400 mg Oral TID AC &  HS Nicholaus Bloom, MD   400 mg at 08/20/14 1646  . guaiFENesin (ROBITUSSIN) 100 MG/5ML solution 200 mg  10 mL Oral Q4H PRN Nicholaus Bloom, MD   200 mg at 08/19/14 2240  . hydrOXYzine (ATARAX/VISTARIL) tablet 50 mg  50 mg Oral Q6H PRN Kerrie Buffalo, NP   50 mg at 08/20/14 1209  . ibuprofen (ADVIL,MOTRIN) tablet 600 mg  600 mg Oral Q4H PRN Kerrie Buffalo, NP   600 mg at 08/20/14 1649  . lipase/protease/amylase (CREON) capsule 12,000 Units  12,000 Units Oral TID WC Delfin Gant, NP   12,000 Units at 08/20/14 1646  . loperamide (IMODIUM) capsule 2-4 mg  2-4 mg Oral PRN Harriet Butte, NP      . LORazepam (ATIVAN) tablet 1 mg  1 mg Oral BID Harriet Butte, NP   1 mg at 08/20/14 1647   Followed by  . [START ON 08/22/2014] LORazepam (ATIVAN) tablet 1 mg  1 mg Oral Daily Harriet Butte, NP      . LORazepam (ATIVAN) tablet 1 mg  1 mg Oral Q6H PRN Kerrie Buffalo, NP      . magnesium hydroxide (MILK OF MAGNESIA) suspension 30 mL  30 mL Oral Daily PRN Delfin Gant, NP      . multivitamin with minerals tablet 1 tablet  1 tablet Oral Daily Harriet Butte, NP   1 tablet at 08/20/14 0818  . nicotine (NICODERM CQ - dosed in mg/24 hours) patch 21 mg  21 mg Transdermal Daily Delfin Gant, NP   21 mg at 08/20/14 0817  . ondansetron (ZOFRAN-ODT) disintegrating tablet 4 mg  4 mg Oral Q6H PRN  Harriet Butte, NP   4 mg at 08/18/14 1634  . QUEtiapine (SEROQUEL) tablet 25 mg  25 mg Oral BID Ambrose Finland, MD   25 mg at 08/20/14 1647  . thiamine (VITAMIN B-1) tablet 100 mg  100 mg Oral Daily Harriet Butte, NP   100 mg at 08/20/14 0818  . traZODone (DESYREL) tablet 50 mg  50 mg Oral QHS PRN Delfin Gant, NP   50 mg at 08/19/14 2134    Lab Results: No results found for this or any previous visit (from the past 48 hour(s)).  Physical Findings: AIMS: Facial and Oral Movements Muscles of Facial Expression: None, normal Lips and Perioral Area: None, normal Jaw: None, normal Tongue: None, normal,Extremity Movements Upper (arms, wrists, hands, fingers): None, normal Lower (legs, knees, ankles, toes): None, normal, Trunk Movements Neck, shoulders, hips: None, normal, Overall Severity Severity of abnormal movements (highest score from questions above): None, normal Incapacitation due to abnormal movements: None, normal Patient's awareness of abnormal movements (rate only patient's report): No Awareness, Dental Status Current problems with teeth and/or dentures?: No Does patient usually wear dentures?: No  CIWA:  CIWA-Ar Total: 1 COWS:  COWS Total Score: 1  Treatment Plan Summary: Daily contact with patient to assess and evaluate symptoms and progress in treatment and Medication management Supportive approach/coping skills Alcohol Dependence; complete detox/work a relapse prevention plan Depression; continue to work with the Prozac providing some augmentation with the Seroquel 25 mg BID Mood instability/anxiety; continue to work with the Neurontin increase to 400 mg Chronic pain; work with the Neurontin  Bereavement; continue to facilitate grief work Explore other follow up options like a CD IOP in [place] of a residential treatment program Medical Decision Making:  Review of Psycho-Social Stressors (1), Review or order clinical lab tests (1),  Review of Medication  Regimen & Side Effects (2) and Review of New Medication or Change in Dosage (2)     Joaopedro Eschbach A 08/20/2014, 5:42 PM

## 2014-08-20 NOTE — Progress Notes (Addendum)
D: Pt has anxious, depressed affect and mood.  She reports she had a good visit with her boyfriend.  When asked what her goal for the night was, pt stated "well, I have to call my boss in the morning, so my goal is to put some thought into how I want the conversation to go."  Pt denies SI/HI, denies hallucinations.  Pt has been more interactive with peers this evening.  She attended evening group.   A: Met with pt 1:1 and provided support and encouragement.  Medications administered per order.  PRN medications administered for pain, sleep, anxiety, cough.  Actively listened to pt. R: Pt is compliant with medications.  She verbally contracts for safety.  Will continue to monitor and assess for safety.

## 2014-08-20 NOTE — Plan of Care (Signed)
Problem: Ineffective individual coping Goal: STG: Patient will remain free from self harm Outcome: Progressing Pt has not harmed herself tonight.  She denies thoughts of self-harm/SI.  Pt verbally contracted for safety.

## 2014-08-20 NOTE — Plan of Care (Signed)
Problem: Consults Goal: Suicide Risk Patient Education (See Patient Education module for education specifics)  Outcome: Completed/Met Date Met:  08/20/14 Nurse discussed suicide thoughts/coping skills with patient.     

## 2014-08-20 NOTE — Progress Notes (Signed)
   08/20/14 1100  Clinical Encounter Type  Visited With Other (Comment) (Group )  Visit Type Spiritual support;Psychological support;Behavioral Health (Grief )    Pt attended spiritual care group on grief and loss facilitated by chaplain Jerene Pitch. Group opened with brief discussion and psycho-social ed around grief and loss in relationships and in relation to self - identifying life patterns, circumstances, changes that cause losses. Established group norm of speaking from own life experience. Group goal of establishing open and affirming space for members to share loss and experience with grief, normalize grief experience and provide psycho social education and grief support.  Group drew on narrative and Alderian therapeutic modalities.   [name] was present and engaged throughout group.  Shared about grief in her life around loss of family due to ETOH.  Has several family members that are no longer in her life due to drinking.  Described "regret" as primary pain of grief, stating that she feels these losses are "all her fault."  Reported that when she begins to feel overwhelmed by these losses, she turns to ETOH, as this has been a primary coping mechanism for her.  A chief component of her current grief journey is being able to forgive herself.  Stated that she is hopeful to move forward living in a different way, and that she may be able to repair these relationships.  Spoke with another group member about acknowledging the losses that she has experienced and finding strength to move forward.    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  Her mother died in 2023-03-16, and [name] currently lives in her mother's house.      [place], [place]

## 2014-08-20 NOTE — Progress Notes (Signed)
D:  Patient's self inventory sheet, patient slept good last night, sleep medication was helpful.  Good appetite, normal energy level, good concentration.  Rated depression 5, denied hopeless, anxiety #10.  Withdrawals, cravings.  Denied SI.  Physical problems.  Physical pain, left wrist, lower back.  Pain medication is helpful.  Goal is to talk with boss, that is so scarey.  Plans to keep positive.  Will talk to MD about discharge.  Needs assistance after discharge.  Will discuss with SW. A:  Medications administered per MD orders.  Emotional support and encouragement given patient. R:  Denied SI and HI, contracts for safety.  Denied A/V hallucinations.  Safety maintained with 15 minute checks.

## 2014-08-20 NOTE — Progress Notes (Signed)
Recreation Therapy Notes  Date: 05.02.2016 Time: 9:30am Location: 300 Hall Group Room   Group Topic: Stress Management  Goal Area(s) Addresses:  Patient will actively participate in stress management techniques presented during session.   Behavioral Response: Did not attend.     Laureen Ochs Kainoah Bartosiewicz, LRT/CTRS  Lane Hacker 08/20/2014 4:59 PM

## 2014-08-20 NOTE — BHH Group Notes (Signed)
[facility] LCSW Group Therapy  08/20/2014 12:49 PM  Type of Therapy:  Group Therapy  Participation Level:  Active  Participation Quality:  Attentive  Affect:  Appropriate  Cognitive:  Alert and Oriented  Insight:  Engaged  Engagement in Therapy:  Engaged  Modes of Intervention:  Confrontation, Discussion, Education, Exploration, Problem-solving, Rapport Building, Socialization and Support  Summary of Progress/Problems: Today's Topic: Overcoming Obstacles. Patients identified one short term goal and potential obstacles in reaching this goal. Patients processed barriers involved in overcoming these obstacles. Patients identified steps necessary for overcoming these obstacles and explored motivation (internal and external) for facing these difficulties head on. Rania was attentive and engaged during today's processing group. She shared that her biggest obstacle involves getting into sa iop with work schedule. She was open to the [facility] being a referral option and was open to talking about other ways to work on [employer] relapse--AA/ [place] groups including peer support.   Smart, Kae Lauman LCSWA  08/20/2014, 12:49 PM

## 2014-08-20 NOTE — BHH Group Notes (Signed)
[facility] LCSW Aftercare Discharge Planning Group Note   08/20/2014 10:05 AM  Participation Quality:  Appropriate   Mood/Affect:  Anxious  Depression Rating:  0  Anxiety Rating:  3  Thoughts of Suicide:  No Will you contract for safety?   NA  Current AVH:  No  Plan for Discharge/Comments:  Pt reports that she wants to d/c home rather than go to inpatient treatment. Pt plans to see Dr. Toy Care for med management and does not want other referrals for therapy or SA IOP.  Pt wants increased prozac and wants to be prescribed ativan. CSW to follow-up with MD.   Transportation Means: friend   Supports: friends.   Smart, [employer]

## 2014-08-20 NOTE — Tx Team (Signed)
Interdisciplinary Treatment Plan Update (Adult)  Date:  08/20/2014 Time Reviewed:  12:22 PM  Progress in Treatment: Attending groups: Yes. Participating in groups:  Yes. Taking medication as prescribed:  Yes. Tolerating medication:  Yes. Family/Significant othe contact made:  No, will contact:  pt's friend. pt provided consent Patient understands diagnosis:  Yes. Discussing patient identified problems/goals with staff:  Yes. Medical problems stabilized or resolved:  Yes. Denies suicidal/homicidal ideation: Yes. Issues/concerns per patient self-inventory:  No. Other:  New problem(s) identified:   Discharge Plan or Barriers: Pt no longer interested in [facility] and wants to follow-up with Dr. Malka So for med management only. Pt not interested in SA IOP or therapy. Pt given [facility] info and AA list.   Reason for Continuation of Hospitalization: Medication stabilization  Comments: Caucasian female, 59 years old was evaluated for suicide attempt by cutting her wrist requiring 16 sutures. She was brought in under IVC by [employer] after found in front of her porch bleeding. Patient reported that she has been dealing with Alcoholism and she just got tired of drinking. She reports drinking a beer or two daily in the evening however her Alcohol level on arrival was 208. Also her UDS is positive for Marijuana And Benzodiazepines. Patient reports that she d6ecided to end her life because she has been dealing with Pancreatitis and Pancreatic pain. Patient reports past detox treatment and was taking medications for depression and anxiety. Patient stated that she stopped taking her medications to if she can live without them. 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 Patient reports poor sleep and appetite. Patient feels hopeless and helpless. She was tearful through out the interview. She denies SI/HI/AVH at the  time.  Estimated length of stay: 1 day  New goal(s): n/a   Review of initial/current patient goals per problem list:   1.  Goal(s): Detox safely   Met:  Yes  Target date: 1 day   As evidenced by: no withdrawal sx and stable vitals.   2.  Goal (s): medication management for mood stabilization   Met:  Yes  Target date: 1 day   As evidenced by: decrease in depression/anxiety as expressed by pt in self report and behavior.   Additional Comments:  Patient and CSW reviewed pt's identified goals and treatment plan. Patient verbalized understanding and agreed to treatment plan. CSW reviewed [name] "Discharge Process and Patient Involvement" Form. Pt verbalized understanding of information provided and signed form.   Attendees: Patient:   08/20/2014 12:22 PM   Family:   08/20/2014 12:22 PM   Physician:  Dr. Carlton Adam, MD 08/20/2014 12:22 PM   Nursing:   Gean Maidens RN 08/20/2014 12:22 PM   Clinical Social Worker: Maxie Better, [place]  08/20/2014 12:22 PM   Clinical Social Worker: Erasmo Downer Drinkard LCSWA 08/20/2014 12:22 PM   Other:  Gerline Legacy Nurse Case Manager 08/20/2014 12:22 PM   Other:  Mateo Flow; [facility]  08/20/2014 12:22 PM   Other:   08/20/2014 12:22 PM   Other:  08/20/2014 12:22 PM   Other:  08/20/2014 12:22 PM   Other:  08/20/2014 12:22 PM   Other:  08/20/2014 12:22 PM   Other:  08/20/2014 12:22 PM   Other:  08/20/2014 12:22 PM   Other:   08/20/2014 12:22 PM    Scribe for Treatment Team:   Maxie Better, LCSWA  08/20/2014 12:22 PM

## 2014-08-21 MED ORDER — THIAMINE HCL 100 MG PO TABS: 100.0000 mg | ORAL_TABLET | Freq: Every day | ORAL | Status: DC

## 2014-08-21 MED ORDER — QUETIAPINE FUMARATE 25 MG PO TABS: 25.0000 mg | ORAL_TABLET | Freq: Two times a day (BID) | ORAL | Status: DC

## 2014-08-21 MED ORDER — FLUOXETINE HCL 40 MG PO CAPS: 40.0000 mg | ORAL_CAPSULE | Freq: Every day | ORAL | Status: DC

## 2014-08-21 MED ORDER — IBUPROFEN 200 MG PO TABS: 400.0000 mg | ORAL_TABLET | Freq: Four times a day (QID) | ORAL | Status: DC | PRN

## 2014-08-21 MED ORDER — NICOTINE 21 MG/24HR TD PT24: 21.0000 mg | MEDICATED_PATCH | Freq: Every day | TRANSDERMAL | Status: DC

## 2014-08-21 MED ORDER — ALBUTEROL SULFATE HFA 108 (90 BASE) MCG/ACT IN AERS: 2.0000 | INHALATION_SPRAY | Freq: Four times a day (QID) | RESPIRATORY_TRACT | Status: DC | PRN

## 2014-08-21 MED ORDER — TRAZODONE HCL 50 MG PO TABS: 50.0000 mg | ORAL_TABLET | Freq: Every evening | ORAL | Status: DC | PRN

## 2014-08-21 MED ORDER — GABAPENTIN 400 MG PO CAPS: 400.0000 mg | ORAL_CAPSULE | Freq: Three times a day (TID) | ORAL | Status: DC

## 2014-08-21 MED ORDER — PANCRELIPASE (LIP-PROT-AMYL) 12000-38000 UNITS PO CPEP: 12000.0000 [IU] | ORAL_CAPSULE | Freq: Three times a day (TID) | ORAL | Status: DC

## 2014-08-21 MED ORDER — ACAMPROSATE CALCIUM 333 MG PO TBEC: 666.0000 mg | DELAYED_RELEASE_TABLET | Freq: Three times a day (TID) | ORAL | Status: DC

## 2014-08-21 MED ORDER — ACAMPROSATE CALCIUM 333 MG PO TBEC
666.0000 mg | DELAYED_RELEASE_TABLET | Freq: Three times a day (TID) | ORAL | Status: DC
  Administered 2014-08-21: 666 mg via ORAL
  Filled 2014-08-21 (×2): qty 2
  Filled 2014-08-21 (×3): qty 24
  Filled 2014-08-21: qty 2

## 2014-08-21 NOTE — Progress Notes (Signed)
D/C instructions/meds/follow-up appointments reviewed, pt verbalized understanding, pt's belongings returned to pt, samples given. 

## 2014-08-21 NOTE — Progress Notes (Signed)
  [facility] Adult Case Management Discharge Plan :  Will you be returning to the same living situation after discharge:  Yes,  home At discharge, do you have transportation home?: Yes,  friend Do you have the ability to pay for your medications: Yes,  [employer]  Release of information consent forms completed and submitted to Medical Records by CSW.  Patient to Follow up at: Follow-up Information    Follow up with [facility]  On 08/24/2014.   Why:  Appt for assessment (SA IOP and med management) on this date at 4pm with Mr. [facility]. Please bring insurance card with you to this appt.     Contact information:   [address] [place], [place] [address] Phone: [phone] Fax: [phone]      Follow up with [facility] On 08/24/2014.   Why:  Primary Care appointment on Friday May 6th at 9 am. Please call office if you need to reschedule.   Contact information:   [address] [place], [place] [address] Phone: [phone] Fax: [phone]      Patient denies SI/HI: Yes,  during group/self report    Safety Planning and Suicide Prevention discussed: Yes,  Pt refused to consent to family contact. SPE completed with pt.  Have you used any form of tobacco in the last 30 days? (Cigarettes, Smokeless Tobacco, Cigars, and/or Pipes): Yes  Has patient been referred to the Quitline?: Yes, faxed on 08/19/14  Smart, [place] 08/21/2014, 12:33 PM

## 2014-08-21 NOTE — BHH Suicide Risk Assessment (Signed)
[employer] Discharge Suicide Risk Assessment   Demographic Factors:  Caucasian  Total Time spent with patient: 30 minutes  Musculoskeletal: Strength & Muscle Tone: self induced lacerations left arm Gait & Station: normal Patient leans: N/A  Psychiatric Specialty Exam: Physical Exam  Review of Systems  Constitutional: Negative.   HENT: Negative.   Eyes: Negative.   Respiratory: Negative.   Cardiovascular: Negative.   Gastrointestinal: Negative.   Genitourinary: Negative.   Musculoskeletal: Negative.        S/P self induced lacerations left arm  Skin: Negative.   Neurological: Negative.   Endo/Heme/Allergies: Negative.   Psychiatric/Behavioral: Positive for substance abuse. The patient is nervous/anxious.     Blood pressure 141/73, pulse 86, temperature 97.6 F (36.4 C), temperature source Oral, resp. rate 16, height '5\' 6"'$  (1.676 m), weight 69.4 kg (153 lb), last menstrual period 12/08/2006, SpO2 100 %.Body mass index is 24.71 kg/(m^2).  General Appearance: Fairly Groomed  Engineer, water::  Fair  Speech:  Clear and [id number]  Volume:  Normal  Mood:  Anxious  Affect:  Appropriate  Thought Process:  Coherent and Goal Directed  Orientation:  Full (Time, Place, and Person)  Thought Content:  plans as she moves on, relapse prevention plan  Suicidal Thoughts:  No  Homicidal Thoughts:  No  Memory:  Immediate;   Fair Recent;   Fair Remote;   Fair  Judgement:  Fair  Insight:  Present  Psychomotor Activity:  Normal  Concentration:  Fair  Recall:  [employer] [facility] [place]  Language: Fair  Akathisia:  No  Handed:  Right  AIMS (if indicated):     Assets:  Desire for Improvement Housing Social Support Vocational/Educational  Sleep:  Number of Hours: 6.5  Cognition: WNL  ADL's:  Intact   Have you used any form of tobacco in the last 30 days? (Cigarettes, Smokeless Tobacco, Cigars, and/or Pipes): Yes  Has this patient used any form of tobacco in the last 30 days?  (Cigarettes, Smokeless Tobacco, Cigars, and/or Pipes) Yes, A prescription for an FDA-approved tobacco cessation medication was offered at discharge and the patient refused  Mental Status Per Nursing Assessment::   On Admission:  Suicidal ideation indicated by patient  Current Mental Status by Physician: In full contact with reality. There are no active S/S of withdrawal. There are no active SI plans or intent. She is willing and motivated to pursue further outpatient work. She is looking at a CD IOP, as well as individual counseling to continue to deal with the death of her mother, as well as pursuing AA. She states she has been overwhelmed ( in a positive way) with the show of support from her co workers. She is planning to be back at work on Monday. She is going to see the orthopedist this PM to check on her lesion   Loss Factors: Loss of significant relationship  Historical Factors: NA  Risk Reduction Factors:   Employed and Positive social support  Continued Clinical Symptoms:  Depression:   Comorbid alcohol abuse/dependence Alcohol/Substance Abuse/Dependencies  Cognitive Features That Contribute To Risk:  Closed-mindedness, Polarized thinking and Thought constriction (tunnel vision)    Suicide Risk:  Minimal: No identifiable suicidal ideation.  Patients presenting with no risk factors but with morbid ruminations; may be classified as minimal risk based on the severity of the depressive symptoms  Principal Problem: Alcohol use disorder, severe, dependence Discharge Diagnoses:  Patient Active Problem List   Diagnosis Date Noted  . Alcohol dependence [F10.20] 10/02/2013  Priority: High  . Alcohol withdrawal [F10.239] 10/02/2013    Priority: High  . Major depression, recurrent [F33.9] 10/02/2013    Priority: High  . Alcohol use disorder, severe, dependence [F10.20] 08/17/2014  . Alcohol abuse [F10.10]   . Suicidal behavior [F48.9]   . Wrist laceration [S61.519A]   .  Gastric ulcer [K25.9] 01/20/2012  . Fatty liver [K76.0] 10/11/2011  . 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 Elevated liver function tests [R79.89]   . GERD (gastroesophageal reflux disease) [K21.9]   . 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 HIATAL HERNIA [K44.9] 10/01/2008  . FATTY LIVER DISEASE [K76.89] 10/01/2008    Follow-up Information    Follow up with [facility]  On 08/24/2014.   Why:  Appt for assessment (SA IOP and med management) on this date at 4pm with Mr. [facility]. Please bring insurance card with you to this appt.     Contact information:   [address] [place], [place] [address] Phone: [phone] Fax: [phone]      Follow up with [facility] On 08/24/2014.   Why:  Primary Care appointment on Friday May 6th at 9 am. Please call office if you need to reschedule.   Contact information:   [address] [place], [place] [address] Phone: [phone] Fax: [phone]      Plan Of Care/Follow-up recommendations:  Activity:  as tolerated Diet:  regular Follow up [place] as above Is patient on multiple antipsychotic therapies at discharge:  No   Has Patient had three or more failed trials of antipsychotic monotherapy by history:  No  Recommended Plan for Multiple Antipsychotic Therapies: NA    Inez Rosato A 08/21/2014, 12:17 PM

## 2014-08-21 NOTE — Progress Notes (Signed)
Pt has been observed in the dayroom watching TV and talking with peers.  She reports she has had a good day.  She is still having significant pain to her arm and is taking the Ibuprofen for pain as needed per order.  She says she is being discharged tomorrow to home and will follow up with her MD.  She decided she did not want to pursue long term treatment.  She found out she will be able to keep her job, so she wants to get back to it as soon as possible.  She is pleasant and cooperative with staff and peers.  Pt makes her needs known to staff.  She denies SI/HI/AV.  Support and encouragement offered.  Safety maintained with q15 minute checks.

## 2014-08-21 NOTE — BHH Suicide Risk Assessment (Signed)
[name] INPATIENT:  Family/Significant Other Suicide Prevention Education  Suicide Prevention Education:  Patient Refusal for Family/Significant Other Suicide Prevention Education: The patient [name] has refused to provide written consent for family/significant other to be provided Family/Significant Other Suicide Prevention Education during admission and/or prior to discharge.  Physician notified.  SPE completed with pt, as she rescinded her consent to allow contact with friend. SPI pamphlet provided to pt and she was encouraged to share information with support network, ask questions, and talk about any concerns relating to SPE. Pt denies SI/HI and denies access to guns/firearms.   Smart, Pauline Pegues LCSWA  08/21/2014, 11:22 AM

## 2014-08-21 NOTE — BHH Group Notes (Signed)
Adult Psychoeducational Group Note  Date:  08/21/2014 Time:  0900 am  Group Topic/Focus:  Orientation:   The focus of this group is to educate the patient on the purpose and policies of crisis stabilization and provide a format to answer questions about their admission.  The group details unit policies and expectations of patients while admitted.  Participation Level:  Active  Participation Quality:  Appropriate and Attentive  Affect:  Appropriate  Cognitive:  Alert and Appropriate  Insight: Appropriate and Good  Engagement in Group:  Engaged  Modes of Intervention:  Discussion, Education and Orientation  Additional Comments:  Pt was attentive during group, shared with group that she is being discharged today.  Wynn Banker 08/21/2014, 9:19 AM

## 2014-08-21 NOTE — Discharge Summary (Signed)
Physician Discharge Summary Note  Patient:  [name] is an 59 y.o., female MRN:  [id number] DOB:  01-16-1956 Patient phone:  [phone] (home)  Patient address:   [address] [place] [address],  Total Time spent with patient: Greater than 30 minutes  Date of Admission:  08/17/2014  Date of Discharge: 08/21/14  Reason for Admission: Alcohol use disorder  Principal Problem: Alcohol use disorder, severe, dependence Discharge Diagnoses: Patient Active Problem List   Diagnosis Date Noted  . Alcohol use disorder, severe, dependence [F10.20] 08/17/2014  . Alcohol abuse [F10.10]   . Suicidal behavior [F48.9]   . Wrist laceration [S61.519A]   . Alcohol dependence [F10.20] 10/02/2013  . Alcohol withdrawal [F10.239] 10/02/2013  . Major depression, recurrent [F33.9] 10/02/2013  . Gastric ulcer [K25.9] 01/20/2012  . Fatty liver [K76.0] 10/11/2011  . 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 Elevated liver function tests [R79.89]   . GERD (gastroesophageal reflux disease) [K21.9]   . 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 HIATAL HERNIA [K44.9] 10/01/2008  . FATTY LIVER DISEASE [K76.89] 10/01/2008   Musculoskeletal: Strength & Muscle Tone: within normal limits Gait & Station: normal Patient leans: N/A  Psychiatric Specialty Exam: Physical Exam  Psychiatric: Her speech is normal and behavior is normal. Judgment and thought content normal. Her mood appears not anxious. Her affect is not angry, not blunt, not labile and not inappropriate. Cognition and memory are normal. She does not exhibit a depressed mood.    Review of Systems  Constitutional: Negative.   HENT: Negative.   Eyes: Negative.   Respiratory: Negative.   Cardiovascular: Negative.   Gastrointestinal: Negative.   Genitourinary: Negative.   Musculoskeletal: Negative.   Skin: Negative.   Neurological: Negative.   Endo/Heme/Allergies: Negative.   Psychiatric/Behavioral: Positive for  depression (Stable) and substance abuse (Alcoholism, chronic). Negative for suicidal ideas, hallucinations and memory loss. The patient has insomnia (Stable). The patient is not nervous/anxious.     Blood pressure 141/73, pulse 86, temperature 97.6 F (36.4 C), temperature source Oral, resp. rate 16, height '5\' 6"'$  (1.676 m), weight 69.4 kg (153 lb), last menstrual period 12/08/2006, SpO2 100 %.Body mass index is 24.71 kg/(m^2).  See Md's SRA   Past Medical History:  Past Medical History  Diagnosis Date  . GERD (gastroesophageal reflux disease)   . Rosacea   . Elevated liver function tests   . Wears glasses   . Chronic diarrhea   . Alcohol abuse     H/o withdrawal   . PONV (postoperative nausea and vomiting)   . Pancreatitis   . Depression   . Anxiety     Past Surgical History  Procedure Laterality Date  . Cholecystectomy    . Shoulder surgery  [id number]  . Diagnostic mammogram  2008  . Tubal ligation    . Colonoscopy  Never  . Esophagogastroduodenoscopy  03/21/2011    Procedure: ESOPHAGOGASTRODUODENOSCOPY (EGD);  Surgeon: Scarlette Shorts, MD;  Location: [name] ENDOSCOPY;  Service: Endoscopy;  Laterality: N/A;  Patient may need to be done at bedside tomorrow depending on status  . Ercp  10/06/2011    Procedure: ENDOSCOPIC RETROGRADE CHOLANGIOPANCREATOGRAPHY (ERCP);  Surgeon: Beryle Beams, MD;  Location: [facility] ENDOSCOPY;  Service: Endoscopy;  Laterality: N/A;  . Laparotomy  01/03/2012    Procedure: EXPLORATORY LAPAROTOMY;  Surgeon: Pedro Earls, MD;  Location: [facility] ORS;  Service: General;  Laterality: N/A;  debridement of necrotic pancreas and placement of drains x2  . Esophagogastroduodenoscopy  01/06/2012    Procedure:  ESOPHAGOGASTRODUODENOSCOPY (EGD);  Surgeon: Wonda Horner, MD;  Location: [facility] ENDOSCOPY;  Service: Endoscopy;  Laterality: N/A;  bedside   Family History:  Family History  Problem Relation Age of Onset  . Stroke Mother   . Heart disease Father   . Heart failure Father   .  Heart disease Sister    Social History:  History  Alcohol Use  . 1.5 - 2.0 oz/week  . 3-4 Standard drinks or equivalent per week    Comment: couple bottles wine daily when not workking, M-F when working few beers     History  Drug Use  . Yes  . Special: Marijuana    Comment: THC tiny bowls on week-ends    History   Social History  . Marital Status: Divorced    Spouse Name: N/A  . Number of Children: N/A  . Years of Education: N/A   Social History Main Topics  . Smoking status: Former Smoker -- 1.00 packs/day for 15 years    Quit date: 09/22/1978  . Smokeless tobacco: Never Used  . Alcohol Use: 1.5 - 2.0 oz/week    3-4 Standard drinks or equivalent per week     Comment: couple bottles wine daily when not workking, M-F when working few beers  . Drug Use: Yes    Special: Marijuana     Comment: THC tiny bowls on week-ends  . 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   Other Topics Concern  . None   Social History Narrative   Has a son and some friends with whom she is close. Was in rehab in 09/2010 and was sober for a few months after.    Risk to Self: Is patient at risk for suicide?: No What has been your use of drugs/alcohol within the last 12 months?: THC twice weekly (bowl); Alcohol daily 1 bottle of wine or one 6 pack on weekdays; weekend use is heavier starting w one bottle of wine in AM  Risk to Others: No  Prior Inpatient Therapy: No  Prior Outpatient Therapy: No  Level of Care:  OP  Hospital Course:  Xariah Silvernail is a 59 y.o. female patient admitted with Alcohol use disorder, severe, Recurrent Major depressive disorder,severe, Anxiety disorder. Caucasian female, 59 years old was evaluated for suicide attempt by cutting her wrist requiring 16 sutures. She was brought in under IVC by [employer] after found in front of her porch bleeding. Patient reported that she has been dealing with Alcoholism and she just got tired  of drinking. She reports drinking a beer or two daily in the evening however her Alcohol level on arrival was 208. Also her UDS is positive for Marijuanaand Benzodiazepines.Patient reports that she decided to end her life because she has been dealing with Pancreatitis and Pancreatic pain. Patient reports past detox treatment and was taking medications for depression and anxiety. 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 Patient feels hopeless and helpless. She was tearful through out the interview.She denies SI/HI/AVH at the time.  Towanda was admitted to the hospital for alcohol detoxification treatment. Her blood alcohol level upon admission was 208 per toxicology tests reports & UDS positive for THC. She was intoxicated as well as suicidal, requiring detoxification & mood stabilization treatments. Bentleigh also is suffering from chronic  pancreatis. Her detoxification treatment was achieved using Ativan detoxification regimen on a tapering dose format. By using ativan detox regimen, [name] received a cleaner detox treatment without the lingering adverse effects of the long acting Librium capsules.   Besides the detox treatment, Loreena also was medicated and discharged on Neurontin 400 for substance withdrawal syndrome/agitation, Seroquel 25 mg for mood control, Trazodone 50 mg Q bedtime for sleep, Fluoxetine 40 mg daily for depression, Acamprosate 666 mg for alcoholism & Thiamine 100 mg for low thiamine. She also received other medication management for her other pre- existing medical issues that she presented. She tolerated her treatment regimen without any significant adverse effects and or reactions. She participated in the AA/NA meetings and group counseling sessions being offered and held on this unit. She  learned coping skills.  Elliemae has completed detox treatment. She is currently being discharged to her home to continue treatment on an outpatient basis as noted below. She has been given all the necessary information needed to make these appointments without problems. Upon discharge, she adamantly denies any SIHI, AVH, delusional thoughts, paranoia and or withdrawal symptoms. She received some samples of her [facility] discharge medications. She left [facility] with all personal belongings in no distress. Transportation per friend.   Consults:  psychiatry  Significant Diagnostic Studies:  labs: CBC with diff, CMP, UDS, toxicology tests, U/A, report reviewed, no changes  Discharge Vitals:   Blood pressure 141/73, pulse 86, temperature 97.6 F (36.4 C), temperature source Oral, resp. rate 16, height '5\' 6"'$  (1.676 m), weight 69.4 kg (153 lb), last menstrual period 12/08/2006, SpO2 100 %. Body mass index is 24.71 kg/(m^2). Lab Results:   No results found for this or any previous visit (from the past 72 hour(s)).  Physical Findings: AIMS: Facial and Oral Movements Muscles of Facial Expression: None, normal Lips and Perioral Area: None, normal Jaw: None, normal Tongue: None, normal,Extremity Movements Upper (arms, wrists, hands, fingers): None, normal Lower (legs, knees, ankles, toes): None, normal, Trunk Movements Neck, shoulders, hips: None, normal, Overall Severity Severity of abnormal movements (highest score from questions above): None, normal Incapacitation due to abnormal movements: None, normal Patient's awareness of abnormal movements (rate only patient's report): No Awareness, Dental Status Current problems with teeth and/or dentures?: No Does patient usually wear dentures?: No  CIWA:  CIWA-Ar Total: 1 COWS:  COWS Total Score: 1  See Psychiatric Specialty Exam and Suicide Risk Assessment completed by Attending Physician prior to discharge.  Discharge destination:  Home  Is patient on  multiple antipsychotic therapies at discharge:  No   Has Patient had three or more failed trials of antipsychotic monotherapy by history:  No  Recommended Plan for Multiple Antipsychotic Therapies: NA    Medication List    STOP taking these medications        azithromycin 250 MG tablet  Commonly known as:  ZITHROMAX Z-PAK     BIOTIN PO     calcium carbonate 1250 (500 CA) MG tablet  Commonly known as:  OS-CAL - dosed in mg of elemental calcium     predniSONE 50 MG tablet  Commonly known as:  DELTASONE      TAKE these medications      Indication   acamprosate 333 MG tablet  Commonly known as:  CAMPRAL  Take 2 tablets (666 mg total) by mouth 3 (three) times daily with meals. For alcoholism   Indication:  Excessive Use of Alcohol     albuterol 108 (90 BASE) MCG/ACT inhaler  Commonly  known as:  PROVENTIL HFA;VENTOLIN HFA  Inhale 2 puffs into the lungs every 6 (six) hours as needed for wheezing or shortness of breath.   Indication:  Asthma     FLUoxetine 40 MG capsule  Commonly known as:  PROZAC  Take 1 capsule (40 mg total) by mouth daily. For depression   Indication:  Depression, Major Depressive Disorder     gabapentin 400 MG capsule  Commonly known as:  NEURONTIN  Take 1 capsule (400 mg total) by mouth 4 (four) times daily -  before meals and at bedtime. For agitation/substance withdrawal syndrome   Indication:  Agitation, Alcohol Withdrawal Syndrome, Pain, Substance withdrawal syndrome     ibuprofen 200 MG tablet  Commonly known as:  ADVIL,MOTRIN  Take 2 tablets (400 mg total) by mouth every 6 (six) hours as needed for moderate pain.   Indication:  Mild to Moderate Pain     lipase/protease/amylase 12000 UNITS Cpep capsule  Commonly known as:  CREON  Take 1 capsule (12,000 Units total) by mouth 3 (three) times daily with meals. For low pancreatic enzymes   Indication:  Pancreatic Insufficiency     nicotine 21 mg/24hr patch  Commonly known as:  NICODERM CQ - dosed  in mg/24 hours  Place 1 patch (21 mg total) onto the skin daily. For nicotine addiction   Indication:  Nicotine Addiction     QUEtiapine 25 MG tablet  Commonly known as:  SEROQUEL  Take 1 tablet (25 mg total) by mouth 2 (two) times daily. For agitation/sleep   Indication:  Agitation/insomnia     thiamine 100 MG tablet  Take 1 tablet (100 mg total) by mouth daily. For low thiamine   Indication:  Deficiency in Thiamine or Vitamin B1     traZODone 50 MG tablet  Commonly known as:  DESYREL  Take 1 tablet (50 mg total) by mouth at bedtime as needed for sleep (may repeat X 1). For sleep   Indication:  Trouble Sleeping       Follow-up Information    Follow up with [facility]  On 08/24/2014.   Why:  Appt for assessment (SA IOP and med management) on this date at 4pm with Mr. Ringer. Please bring insurance card with you to this appt.     Contact information:   [address] [place], [place] [address] Phone: [phone] Fax: [phone]      Follow up with [facility] On 08/24/2014.   Why:  Primary Care appointment on Friday May 6th at 9 am. Please call office if you need to reschedule.   Contact information:   [address] [place], [place] [address] Phone: [phone] Fax: [phone]     Follow-up recommendations:  Activity:  As tolerated Diet: As recommended by your primary care doctor. Keep all scheduled follow-up appointments as recommended.  Comments: Take all your medications as prescribed by your mental healthcare provider. Report any adverse effects and or reactions from your medicines to your outpatient provider promptly. Patient is instructed and cautioned to not engage in alcohol and or illegal drug use while on prescription medicines. In the event of worsening symptoms, patient is instructed to call the crisis hotline, 911 and or go to the nearest ED for appropriate evaluation and treatment of symptoms. Follow-up with your primary care  provider for your other medical issues, concerns and or health care needs.   Total Discharge Time: Greater than 30 minutes  Signed: Encarnacion Slates, PMHNP, FNP-BC 08/21/2014, 11:31  AM  I personally assessed the patient and formulated the plan Geralyn Flash A. Sabra Heck, M.D.

## 2014-08-24 ENCOUNTER — Encounter: Payer: Self-pay | Admitting: Family Medicine

## 2014-08-24 ENCOUNTER — Ambulatory Visit: Payer: Managed Care, Other (non HMO) | Attending: Family Medicine | Admitting: Family Medicine

## 2014-08-24 VITALS — BP 124/84 | HR 72 | Temp 98.4°F | Resp 18 | Ht 66.0 in | Wt 159.0 lb

## 2014-08-24 DIAGNOSIS — F489 Nonpsychotic mental disorder, unspecified: Secondary | ICD-10-CM

## 2014-08-24 DIAGNOSIS — R1032 Left lower quadrant pain: Secondary | ICD-10-CM

## 2014-08-24 DIAGNOSIS — Z1211 Encounter for screening for malignant neoplasm of colon: Secondary | ICD-10-CM

## 2014-08-24 DIAGNOSIS — F332 Major depressive disorder, recurrent severe without psychotic features: Secondary | ICD-10-CM

## 2014-08-24 DIAGNOSIS — F10930 Alcohol use, unspecified with withdrawal, uncomplicated: Secondary | ICD-10-CM

## 2014-08-24 DIAGNOSIS — R4589 Other symptoms and signs involving emotional state: Secondary | ICD-10-CM

## 2014-08-24 DIAGNOSIS — R4689 Other symptoms and signs involving appearance and behavior: Secondary | ICD-10-CM

## 2014-08-24 DIAGNOSIS — S61512D Laceration without foreign body of left wrist, subsequent encounter: Secondary | ICD-10-CM | POA: Diagnosis not present

## 2014-08-24 DIAGNOSIS — F10230 Alcohol dependence with withdrawal, uncomplicated: Secondary | ICD-10-CM

## 2014-08-24 MED ORDER — OMEPRAZOLE 40 MG PO CPDR: 40.0000 mg | DELAYED_RELEASE_CAPSULE | Freq: Every day | ORAL | Status: DC

## 2014-08-24 NOTE — Progress Notes (Signed)
Patient hospitalized for cut on wrist and went to rehab for alcohol detox. Patient reports pain at left wrist, described as sore, at level 3.

## 2014-08-24 NOTE — Progress Notes (Addendum)
Subjective:    Patient ID: [name], female    DOB: March 24, 1956, 59 y.o.   MRN: [id number]  HPI  Admit date: 08/17/14 Discharge date: 08/21/14.  [name] was found by [facility] in front of her house with a slashed  wrist which was bleeding and was brought in for involuntary commitment.  Her history is notable for recurrent major depression, anxiety, alcohol use disorder and a recent job loss as an Optometrist and with exacerbation of all her symptoms she decided to end her life.   [name] was admitted to the hospital for alcohol detoxification treatment. Her blood alcohol level upon admission was 208 per toxicology tests reports & UDS positive for THC and benzodiazepines. She was intoxicated as well as suicidal, requiring detoxification & mood stabilization treatments. [name] also is suffering from chronic pancreatis. Her detoxification treatment was achieved using Ativan detoxification regimen on a tapering dose.  She attended [place] meetings and after she was stabilized she was placed on Neurontin 400 for substance withdrawal syndrome/agitation, Seroquel 25 mg for mood control, Trazodone 50 mg Q bedtime for sleep, Fluoxetine 40 mg daily for depression, Acamprosate 666 mg for alcoholism & Thiamine 100 mg for low thiamine and discharged with a follow up at the [facility].  Interval history: She complains of mild left wrist pain which is about 3/10 and she has been applying Neosporin ointment over the laceration and has a wrist brace in place. She also endorses left lower quadrant pain for over a year; she is status post resection of pancreatic pseudocyst and remains on Creon. Endorses intermittent diarrhea and has never had a colonoscopy.  Past Medical History  Diagnosis Date  . GERD (gastroesophageal reflux disease)   . Rosacea   . Elevated liver function tests   . Wears glasses   . Chronic diarrhea   . Alcohol abuse     H/o withdrawal   . PONV  (postoperative nausea and vomiting)   . Pancreatitis   . Depression   . Anxiety     Past Surgical History  Procedure Laterality Date  . Cholecystectomy    . Shoulder surgery  [id number]  . Diagnostic mammogram  2008  . Tubal ligation    . Colonoscopy  Never  . Esophagogastroduodenoscopy  03/21/2011    Procedure: ESOPHAGOGASTRODUODENOSCOPY (EGD);  Surgeon: Scarlette Shorts, MD;  Location: [facility] ENDOSCOPY;  Service: Endoscopy;  Laterality: N/A;  Patient may need to be done at bedside tomorrow depending on status  . Ercp  10/06/2011    Procedure: ENDOSCOPIC RETROGRADE CHOLANGIOPANCREATOGRAPHY (ERCP);  Surgeon: Beryle Beams, MD;  Location: [facility] ENDOSCOPY;  Service: Endoscopy;  Laterality: N/A;  . Laparotomy  01/03/2012    Procedure: EXPLORATORY LAPAROTOMY;  Surgeon: Pedro Earls, MD;  Location: [facility] ORS;  Service: General;  Laterality: N/A;  debridement of necrotic pancreas and placement of drains x2  . Esophagogastroduodenoscopy  01/06/2012    Procedure: ESOPHAGOGASTRODUODENOSCOPY (EGD);  Surgeon: Wonda Horner, MD;  Location: [facility] ENDOSCOPY;  Service: Endoscopy;  Laterality: N/A;  bedside    History   Social History  . Marital Status: Divorced    Spouse Name: N/A  . Number of Children: N/A  . Years of Education: N/A   Occupational History  . Not on file.   Social History Main Topics  . Smoking status: Current Every Day Smoker -- 1.00 packs/day for 15 years  . Smokeless tobacco: Never Used  . Alcohol Use: 1.5 - 2.0 oz/week    3-4 Standard drinks  or equivalent per week     Comment: couple bottles wine daily when not workking, M-F when working few beers  . Drug Use: Yes    Special: Marijuana     Comment: THC tiny bowls on week-ends, last use 08/15/14  . 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   Other Topics Concern  . Not on file   Social History Narrative   Has a son and some friends with whom she is close. Was in rehab in 09/2010 and was sober  for a few months after.     Family History  Problem Relation Age of Onset  . Stroke Mother   . Heart disease Father   . Heart failure Father   . Heart disease Sister     Allergies  Allergen Reactions  . Ciprofloxacin Nausea Only    Current Outpatient Prescriptions on File Prior to Visit  Medication Sig Dispense Refill  . acamprosate (CAMPRAL) 333 MG tablet Take 2 tablets (666 mg total) by mouth 3 (three) times daily with meals. For alcoholism 180 tablet 0  . FLUoxetine (PROZAC) 40 MG capsule Take 1 capsule (40 mg total) by mouth daily. For depression 30 capsule 0  . gabapentin (NEURONTIN) 400 MG capsule Take 1 capsule (400 mg total) by mouth 4 (four) times daily -  before meals and at bedtime. For agitation/substance withdrawal syndrome 120 capsule 0  . ibuprofen (ADVIL,MOTRIN) 200 MG tablet Take 2 tablets (400 mg total) by mouth every 6 (six) hours as needed for moderate pain. 30 tablet 0  . lipase/protease/amylase (CREON) 12000 UNITS CPEP capsule Take 1 capsule (12,000 Units total) by mouth 3 (three) times daily with meals. For low pancreatic enzymes 90 capsule 0  . nicotine (NICODERM CQ - DOSED IN MG/24 HOURS) 21 mg/24hr patch Place 1 patch (21 mg total) onto the skin daily. For nicotine addiction 28 patch 0  . QUEtiapine (SEROQUEL) 25 MG tablet Take 1 tablet (25 mg total) by mouth 2 (two) times daily. For agitation/sleep 60 tablet 0  . thiamine 100 MG tablet Take 1 tablet (100 mg total) by mouth daily. For low thiamine 30 tablet 0  . traZODone (DESYREL) 50 MG tablet Take 1 tablet (50 mg total) by mouth at bedtime as needed for sleep (may repeat X 1). For sleep 60 tablet 0  . albuterol (PROVENTIL HFA;VENTOLIN HFA) 108 (90 BASE) MCG/ACT inhaler Inhale 2 puffs into the lungs every 6 (six) hours as needed for wheezing or shortness of breath. (Patient not taking: Reported on 08/24/2014) 1 Inhaler 0  . [DISCONTINUED] ipratropium (ATROVENT) 0.06 % nasal spray Place 2 sprays into both nostrils  4 (four) times daily. 15 mL 0   No current facility-administered medications on file prior to visit.     Review of Systems  Constitutional: Negative for activity change, appetite change and fatigue.  HENT: Negative for congestion, sinus pressure and sore throat.   Eyes: Negative for visual disturbance.  Respiratory: Negative for cough, chest tightness, shortness of breath and wheezing.   Cardiovascular: Negative for chest pain and palpitations.  Gastrointestinal: Positive for abdominal pain. Negative for constipation and abdominal distention.  Endocrine: Negative for polydipsia.  Genitourinary: Negative for dysuria and frequency.  Musculoskeletal:       See HPI  Skin: Negative for rash.  Neurological: Negative for tremors, light-headedness and numbness.  Hematological: Does not bruise/bleed easily.  Psychiatric/Behavioral: Negative for behavioral problems and agitation.  Objective: Filed Vitals:   08/24/14 0908  BP: 124/84  Pulse: 72  Temp: 98.4 F (36.9 C)  Resp: 18      Physical Exam  Constitutional: She is oriented to person, place, and time. She appears well-developed and well-nourished. No distress.  HENT:  Head: Normocephalic.  Right Ear: External ear normal.  Left Ear: External ear normal.  Nose: Nose normal.  Mouth/Throat: Oropharynx is clear and moist.  Eyes: Conjunctivae and EOM are normal. Pupils are equal, round, and reactive to light.  Neck: Normal range of motion. No JVD present.  Cardiovascular: Normal rate, regular rhythm, normal heart sounds and intact distal pulses.  Exam reveals no gallop.   No murmur heard. Pulmonary/Chest: Effort normal and breath sounds normal. No respiratory distress. She has no wheezes. She has no rales. She exhibits no tenderness.  Abdominal: Soft. Bowel sounds are normal.  Left lower quadrant healed surgical scar, hernia at site which is mildly tender.  Musculoskeletal: She exhibits no edema or tenderness.  Right  writ with sutures over laceration on ventral aspect with no sign of infection  Neurological: She is alert and oriented to person, place, and time. She has normal reflexes.  Skin: Skin is warm and dry. She is not diaphoretic.  Psychiatric: She has a normal mood and affect.          Assessment & Plan:  59 year old female with recent hospitalization for suicidal attempt via left wrist laceration, who just completed inpatient rehabilitation for alcohol abuse and is currently followed by behavioral health.  Depression and anxiety: Remains on antidepressants and has an appointment at the [facility] will be in to see the patient to ensure coordination of care and compliance with appointments.  Left wrist laceration: Healing well, advised to keep appointment with orthopedic next week.  Abdominal pain: I have placed her on a PPI empirically and would also refer her for colonoscopy as she has not had one. Abdominal pain could also be secondary to adhesions from previous pancreatic pseudocyst resection.  Disclaimer: This note was dictated with voice recognition software. Similar sounding words can inadvertently be transcribed and this note may contain transcription errors which may not have been corrected upon publication of note.

## 2014-08-24 NOTE — Progress Notes (Signed)
ASSESSMENT: Pt experiencing motivation to seek treatment for both alcohol use and depression. Pt needs to go to inpatient treatment, then continue with outpatient treatment for both alcohol use and BH. Pt would benefit from F/U with [facility] at next PCP visit for continuity of care and supportive counseling. Stage of Change: action  PLAN: 1. F/U with behavioral health consultant in 2  Weeks. 2. Psychiatric Medications: Prozac, Neurontin, Seroquel, Desyrel, take as prescribed. 3. Behavioral recommendation(s):   -Attend appointment with Ginger Blue this afternoon -Go to [place] with psychiatrist on staff at [facility] attending [place] meetings -Consider outpatient treatment upon exit from [place]: Pt. referred by Dr Jarold Song for supportive counseling  Pt. here for referra; regarding supportive counseling for alcohol and BH treatment.  Pt. reports the following symptoms/concerns: Pt states that she wants to get into [facility] outpatient treatment, and that she wants to see a psychiatrist upon arrival at [place]. She also states that she is currently attending [place] meetings, wants to attend outpatient treatment upon discharge from [place]. She says that she is no longer suicidal, and that she realizes that she is very fortunate to have a neighbor find her outside, when she attempted suicide, and that she thinks it means she is supposed to be here.  Duration of problem: >1 week (motivation) Severity: severe  OBJECTIVE: Orientation & Cognition: Oriented x3. Thought processes normal and appropriate to situation. Mood: appropriate. Affect: appropriate Appearance: appropriate Risk of harm to self or others: no risk of harm to self or others Substance use: alcohol Psychiatric medication use: Unchanged from prior contact. Assessments administered: PSQ9-21/ GAD7-21  Diagnosis: Alcohol withdrawal/major depression, recurrent  CPT  Code: F10.230/ F33.2 -------------------------------------------- Other(s) present in the room: none  Time spent with patient in exam room: 16 minutes

## 2014-08-29 ENCOUNTER — Telehealth: Payer: Self-pay | Admitting: Clinical

## 2014-08-29 NOTE — Telephone Encounter (Signed)
See message above

## 2014-08-29 NOTE — Telephone Encounter (Signed)
See above note

## 2014-09-12 NOTE — Clinical Social Work Note (Signed)
Phone calls from patient wanting FMLA paperwork faxed, MD informed.  Per patient, paperwork has been received by employer.  Edwyna Shell, LCSW Clinical Social Worker

## 2014-09-21 ENCOUNTER — Ambulatory Visit: Payer: Managed Care, Other (non HMO) | Attending: Internal Medicine | Admitting: Internal Medicine

## 2014-09-21 ENCOUNTER — Encounter: Payer: Self-pay | Admitting: Internal Medicine

## 2014-09-21 VITALS — BP 137/83 | HR 59 | Temp 98.0°F | Ht 66.0 in | Wt 158.0 lb

## 2014-09-21 DIAGNOSIS — F1010 Alcohol abuse, uncomplicated: Secondary | ICD-10-CM | POA: Diagnosis not present

## 2014-09-21 DIAGNOSIS — Z9151 Personal history of suicidal behavior: Secondary | ICD-10-CM

## 2014-09-21 DIAGNOSIS — M25561 Pain in right knee: Secondary | ICD-10-CM

## 2014-09-21 DIAGNOSIS — M25562 Pain in left knee: Secondary | ICD-10-CM | POA: Insufficient documentation

## 2014-09-21 DIAGNOSIS — Z9189 Other specified personal risk factors, not elsewhere classified: Secondary | ICD-10-CM

## 2014-09-21 DIAGNOSIS — K219 Gastro-esophageal reflux disease without esophagitis: Secondary | ICD-10-CM | POA: Diagnosis not present

## 2014-09-21 DIAGNOSIS — Z8261 Family history of arthritis: Secondary | ICD-10-CM | POA: Diagnosis not present

## 2014-09-21 DIAGNOSIS — F17200 Nicotine dependence, unspecified, uncomplicated: Secondary | ICD-10-CM | POA: Insufficient documentation

## 2014-09-21 DIAGNOSIS — F1011 Alcohol abuse, in remission: Secondary | ICD-10-CM

## 2014-09-21 DIAGNOSIS — Z915 Personal history of self-harm: Secondary | ICD-10-CM | POA: Insufficient documentation

## 2014-09-21 DIAGNOSIS — Z1211 Encounter for screening for malignant neoplasm of colon: Secondary | ICD-10-CM

## 2014-09-21 DIAGNOSIS — F329 Major depressive disorder, single episode, unspecified: Secondary | ICD-10-CM | POA: Diagnosis not present

## 2014-09-21 DIAGNOSIS — Z720 Tobacco use: Secondary | ICD-10-CM

## 2014-09-21 LAB — COMPLETE METABOLIC PANEL WITH GFR
ALT: 11 U/L (ref 0–35)
AST: 15 U/L (ref 0–37)
Albumin: 4 g/dL (ref 3.5–5.2)
Alkaline Phosphatase: 55 U/L (ref 39–117)
BILIRUBIN TOTAL: 0.3 mg/dL (ref 0.2–1.2)
BUN: 14 mg/dL (ref 6–23)
CHLORIDE: 104 meq/L (ref 96–112)
CO2: 26 mEq/L (ref 19–32)
CREATININE: 0.63 mg/dL (ref 0.50–1.10)
Calcium: 9.2 mg/dL (ref 8.4–10.5)
GFR, Est Non African American: >89 mL/min
GLUCOSE: 96 mg/dL (ref 70–99)
POTASSIUM: 3.9 meq/L (ref 3.5–5.3)
Sodium: 140 mEq/L (ref 135–145)
Total Protein: 6.7 g/dL (ref 6.0–8.3)

## 2014-09-21 LAB — RHEUMATOID FACTOR

## 2014-09-21 NOTE — Patient Instructions (Signed)
Arthritis, Nonspecific °Arthritis is inflammation of a joint. This usually means pain, redness, warmth or swelling are present. One or more joints may be involved. There are a number of types of arthritis. Your caregiver may not be able to tell what type of arthritis you have right away. °CAUSES  °The most common cause of arthritis is the wear and tear on the joint (osteoarthritis). This causes damage to the cartilage, which can break down over time. The knees, hips, back and neck are most often affected by this type of arthritis. °Other types of arthritis and common causes of joint pain include: °· Sprains and other injuries near the joint. Sometimes minor sprains and injuries cause pain and swelling that develop hours later. °· Rheumatoid arthritis. This affects hands, feet and knees. It usually affects both sides of your body at the same time. It is often associated with chronic ailments, fever, weight loss and general weakness. °· Crystal arthritis. Gout and pseudo gout can cause occasional acute severe pain, redness and swelling in the foot, ankle, or knee. °· Infectious arthritis. Bacteria can get into a joint through a break in overlying skin. This can cause infection of the joint. Bacteria and viruses can also spread through the blood and affect your joints. °· Drug, infectious and allergy reactions. Sometimes joints can become mildly painful and slightly swollen with these types of illnesses. °SYMPTOMS  °· Pain is the main symptom. °· Your joint or joints can also be red, swollen and warm or hot to the touch. °· You may have a fever with certain types of arthritis, or even feel overall ill. °· The joint with arthritis will hurt with movement. Stiffness is present with some types of arthritis. °DIAGNOSIS  °Your caregiver will suspect arthritis based on your description of your symptoms and on your exam. Testing may be needed to find the type of arthritis: °· Blood and sometimes urine tests. °· X-ray tests  and sometimes CT or MRI scans. °· Removal of fluid from the joint (arthrocentesis) is done to check for bacteria, crystals or other causes. Your caregiver (or a specialist) will numb the area over the joint with a local anesthetic, and use a needle to remove joint fluid for examination. This procedure is only minimally uncomfortable. °· Even with these tests, your caregiver may not be able to tell what kind of arthritis you have. Consultation with a specialist (rheumatologist) may be helpful. °TREATMENT  °Your caregiver will discuss with you treatment specific to your type of arthritis. If the specific type cannot be determined, then the following general recommendations may apply. °Treatment of severe joint pain includes: °· Rest. °· Elevation. °· Anti-inflammatory medication (for example, ibuprofen) may be prescribed. Avoiding activities that cause increased pain. °· Only take over-the-counter or prescription medicines for pain and discomfort as recommended by your caregiver. °· Cold packs over an inflamed joint may be used for 10 to 15 minutes every hour. Hot packs sometimes feel better, but do not use overnight. Do not use hot packs if you are diabetic without your caregiver's permission. °· A cortisone shot into arthritic joints may help reduce pain and swelling. °· Any acute arthritis that gets worse over the next 1 to 2 days needs to be looked at to be sure there is no joint infection. °Long-term arthritis treatment involves modifying activities and lifestyle to reduce joint stress jarring. This can include weight loss. Also, exercise is needed to nourish the joint cartilage and remove waste. This helps keep the muscles   around the joint strong. °HOME CARE INSTRUCTIONS  °· Do not take aspirin to relieve pain if gout is suspected. This elevates uric acid levels. °· Only take over-the-counter or prescription medicines for pain, discomfort or fever as directed by your caregiver. °· Rest the joint as much as  possible. °· If your joint is swollen, keep it elevated. °· Use crutches if the painful joint is in your leg. °· Drinking plenty of fluids may help for certain types of arthritis. °· Follow your caregiver's dietary instructions. °· Try low-impact exercise such as: °¨ Swimming. °¨ Water aerobics. °¨ Biking. °¨ Walking. °· Morning stiffness is often relieved by a warm shower. °· Put your joints through regular range-of-motion. °SEEK MEDICAL CARE IF:  °· You do not feel better in 24 hours or are getting worse. °· You have side effects to medications, or are not getting better with treatment. °SEEK IMMEDIATE MEDICAL CARE IF:  °· You have a fever. °· You develop severe joint pain, swelling or redness. °· Many joints are involved and become painful and swollen. °· There is severe back pain and/or leg weakness. °· You have loss of bowel or bladder control. °Document Released: 05/14/2004 Document Revised: 06/29/2011 Document Reviewed: 05/30/2008 °ExitCare® Patient Information ©2015 [name]. This information is not intended to replace advice given to you by your health care provider. Make sure you discuss any questions you have with your health care provider. ° °

## 2014-09-21 NOTE — Progress Notes (Signed)
Patient ID: [name], female   DOB: [birth year], 59 y.o.   MRN: [id number]  CC: knee pain   HPI: [name] is a 59 y.o. female here today for a follow up visit.  Patient has past medical history of depression, anxiety, and recent suicide attempt. . She reports that she has been sober from alcohol for 33 days and she has been going to [facility] for psychiatric care. She feels much better and feels like the program is helping with her depression. Today she would like evaluation of her bilateral knee pain. Knee pain has been present for several weeks. She states that before when she was drinking heavy she did not pay attention to the pain.  Pain described as a achy pain with occasional sharp pains when getting up out of a chair. She denies swelling or redness. She reports that she has a family member with rheumatoid arthritis. She is currently in the process of smoking cessation.   Patient has No headache, No chest pain, No abdominal pain - No Nausea, No new weakness tingling or numbness, No Cough - SOB.  Allergies  Allergen Reactions  . Ciprofloxacin Nausea Only   Past Medical History  Diagnosis Date  . GERD (gastroesophageal reflux disease)   . Rosacea   . Elevated liver function tests   . Wears glasses   . Chronic diarrhea   . Alcohol abuse     H/o withdrawal   . PONV (postoperative nausea and vomiting)   . Pancreatitis   . Depression   . Anxiety    Current Outpatient Prescriptions on File Prior to Visit  Medication Sig Dispense Refill  . FLUoxetine (PROZAC) 40 MG capsule Take 1 capsule (40 mg total) by mouth daily. For depression 30 capsule 0  . gabapentin (NEURONTIN) 400 MG capsule Take 1 capsule (400 mg total) by mouth 4 (four) times daily -  before meals and at bedtime. For agitation/substance withdrawal syndrome 120 capsule 0  . ibuprofen (ADVIL,MOTRIN) 200 MG tablet Take 2 tablets (400 mg total) by mouth every 6 (six) hours as needed for moderate pain. 30 tablet  0  . lipase/protease/amylase (CREON) 12000 UNITS CPEP capsule Take 1 capsule (12,000 Units total) by mouth 3 (three) times daily with meals. For low pancreatic enzymes 90 capsule 0  . QUEtiapine (SEROQUEL) 25 MG tablet Take 1 tablet (25 mg total) by mouth 2 (two) times daily. For agitation/sleep 60 tablet 0  . thiamine 100 MG tablet Take 1 tablet (100 mg total) by mouth daily. For low thiamine 30 tablet 0  . traZODone (DESYREL) 50 MG tablet Take 1 tablet (50 mg total) by mouth at bedtime as needed for sleep (may repeat X 1). For sleep 60 tablet 0  . acamprosate (CAMPRAL) 333 MG tablet Take 2 tablets (666 mg total) by mouth 3 (three) times daily with meals. For alcoholism (Patient not taking: Reported on 09/21/2014) 180 tablet 0  . albuterol (PROVENTIL HFA;VENTOLIN HFA) 108 (90 BASE) MCG/ACT inhaler Inhale 2 puffs into the lungs every 6 (six) hours as needed for wheezing or shortness of breath. (Patient not taking: Reported on 08/24/2014) 1 Inhaler 0  . nicotine (NICODERM CQ - DOSED IN MG/24 HOURS) 21 mg/24hr patch Place 1 patch (21 mg total) onto the skin daily. For nicotine addiction (Patient not taking: Reported on 09/21/2014) 28 patch 0  . omeprazole (PRILOSEC) 40 MG capsule Take 1 capsule (40 mg total) by mouth daily. (Patient not taking: Reported on 09/21/2014) 30 capsule 3  . [  DISCONTINUED] ipratropium (ATROVENT) 0.06 % nasal spray Place 2 sprays into both nostrils 4 (four) times daily. 15 mL 0   No current facility-administered medications on file prior to visit.   Family History  Problem Relation Age of Onset  . Stroke Mother   . Heart disease Father   . Heart failure Father   . Heart disease Sister    History   Social History  . Marital Status: Divorced    Spouse Name: N/A  . Number of Children: N/A  . Years of Education: N/A   Occupational History  . Not on file.   Social History Main Topics  . Smoking status: Light Tobacco Smoker -- 0.25 packs/day for 15 years  . Smokeless  tobacco: Never Used  . Alcohol Use: No     Comment: couple bottles wine daily when not workking, M-F when working few beers  . Drug Use: No     Comment: THC tiny bowls on week-ends, last use 08/15/14  . 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   Other Topics Concern  . Not on file   Social History Narrative   Has a son and some friends with whom she is close. Was in rehab in 09/2010 and was sober for a few months after.     Review of Systems: Constitutional: Negative for fever, chills, diaphoresis, activity change, appetite change and fatigue. HENT: Negative for ear pain, nosebleeds, congestion, facial swelling, rhinorrhea, neck pain, neck stiffness and ear discharge.  Eyes: Negative for pain, discharge, redness, itching and visual disturbance. Respiratory: Negative for cough, choking, chest tightness, shortness of breath, wheezing and stridor.  Cardiovascular: Negative for chest pain, palpitations and leg swelling. Gastrointestinal: Negative for abdominal distention. Genitourinary: Negative for dysuria, urgency, frequency, hematuria, flank pain, decreased urine volume, difficulty urinating and dyspareunia.  Musculoskeletal: Negative for back pain, joint swelling, and gait problem. + arthralgia's Neurological: Negative for dizziness, tremors, seizures, syncope, facial asymmetry, speech difficulty, weakness, light-headedness, numbness and headaches.  Hematological: Negative for adenopathy. Does not bruise/bleed easily. Psychiatric/Behavioral: Negative for hallucinations, behavioral problems, confusion, dysphoric mood, decreased concentration and agitation.    Objective:   Filed Vitals:   09/21/14 1132  BP: 137/83  Pulse: 59  Temp: 98 F (36.7 C)    Physical Exam  Constitutional: She is oriented to person, place, and time.  Cardiovascular: Normal rate, regular rhythm and normal heart sounds.   Pulmonary/Chest: Effort normal and breath  sounds normal.  Musculoskeletal: Normal range of motion. She exhibits no edema or tenderness.  Neurological: She is alert and oriented to person, place, and time.  Psychiatric: She has a normal mood and affect. Her behavior is normal.   .  Lab Results  Component Value Date   WBC 10.9* 08/16/2014   HGB 14.2 08/16/2014   HCT 42.5 08/16/2014   MCV 98.2 08/16/2014   PLT 235 08/16/2014   Lab Results  Component Value Date   CREATININE 0.56 08/16/2014   BUN 13 08/16/2014   NA 138 08/16/2014   K 3.9 08/16/2014   CL 108 08/16/2014   CO2 20 08/16/2014    Lab Results  Component Value Date   HGBA1C 6.0* 11/27/2010   Lipid Panel     Component Value Date/Time   CHOL 140 02/01/2012 0531   TRIG 155* 02/01/2012 0531   HDL 49 09/23/2011 0454   CHOLHDL 3.4 09/23/2011 0454   VLDL 40 09/23/2011 0454   LDLCALC 78 09/23/2011  [id number]       Assessment and plan:   [name] was seen today for establish care and arthritis.  Diagnoses and all orders for this visit:  Knee pain, bilateral Orders: -     Rheumatoid factor -     COMPLETE METABOLIC PANEL WITH GFR I have explained that she may use ibuprofen or tylenol arthritis for knee pain. She can use heat and continue to perform ROM exercises to prevent stiffness. She will try this first for 6 weeks, if no improvement I will refer to Orthopedics for possible joint injections  History of suicide attempt Continue counseling and therapy   History of alcohol abuse See above   Colon cancer screening Orders: -     Ambulatory referral to Gastroenterology Needs colonoscopy   Tobacco use Praised patient for attempting to quit. Gave patient some resources for continue support during her smoking cessation journey.     Return if symptoms worsen or fail to improve.   Chari Manning, NP-C [facility] [phone] 09/21/2014, 11:41 AM

## 2014-09-21 NOTE — Progress Notes (Signed)
Patient here to establish care She states she has been sober (ETOH) for 33 days and has been going to [facility] for outpatient therapy which is helping Patient would like to discuss pain in her knees she thinks it is arthritis

## 2014-09-27 ENCOUNTER — Encounter: Payer: Self-pay | Admitting: Internal Medicine

## 2014-10-05 ENCOUNTER — Encounter: Payer: Self-pay | Admitting: Internal Medicine

## 2014-10-05 ENCOUNTER — Ambulatory Visit: Payer: Managed Care, Other (non HMO) | Attending: Internal Medicine | Admitting: Internal Medicine

## 2014-10-05 VITALS — BP 128/78 | HR 73 | Wt 160.2 lb

## 2014-10-05 DIAGNOSIS — I8390 Asymptomatic varicose veins of unspecified lower extremity: Secondary | ICD-10-CM

## 2014-10-05 DIAGNOSIS — M25561 Pain in right knee: Secondary | ICD-10-CM | POA: Diagnosis not present

## 2014-10-05 DIAGNOSIS — M25562 Pain in left knee: Secondary | ICD-10-CM | POA: Diagnosis not present

## 2014-10-05 DIAGNOSIS — I868 Varicose veins of other specified sites: Secondary | ICD-10-CM | POA: Diagnosis not present

## 2014-10-05 MED ORDER — THIAMINE HCL 100 MG PO TABS: 50.0000 mg | ORAL_TABLET | Freq: Every day | ORAL | Status: DC

## 2014-10-05 MED ORDER — TRAMADOL HCL 50 MG PO TABS: 50.0000 mg | ORAL_TABLET | Freq: Two times a day (BID) | ORAL | Status: DC | PRN

## 2014-10-05 NOTE — Progress Notes (Signed)
  Pt present today for leg/ ankle swelling and pain.

## 2014-10-05 NOTE — Progress Notes (Signed)
Patient ID: [name], female   DOB: [birth year], 59 y.o.   MRN: [id number]  CC: leg pain   HPI: [name] is a 59 y.o. female here today for a follow up visit.  Patient has past medical history of GERD, pancreatitis depression, anxiety.  Patient is concerned about bilateral ankle swelling that she has had daily for the past 5 days. She reports being outside the weekend doing yard work and noticed the swelling after that. She does have a history of varicose veins. She reports that she has tried the ibuprofen and tylenol for pain but it is not helping her and would like a referral for orthopedics.   Patient has No headache, No chest pain, No abdominal pain - No Nausea, No new weakness tingling or numbness, No Cough - SOB.  Allergies  Allergen Reactions  . Ciprofloxacin Nausea Only   Past Medical History  Diagnosis Date  . GERD (gastroesophageal reflux disease)   . Rosacea   . Elevated liver function tests   . Wears glasses   . Chronic diarrhea   . Alcohol abuse     H/o withdrawal   . PONV (postoperative nausea and vomiting)   . Pancreatitis   . Depression   . Anxiety    Current Outpatient Prescriptions on File Prior to Visit  Medication Sig Dispense Refill  . FLUoxetine (PROZAC) 40 MG capsule Take 1 capsule (40 mg total) by mouth daily. For depression 30 capsule 0  . gabapentin (NEURONTIN) 400 MG capsule Take 1 capsule (400 mg total) by mouth 4 (four) times daily -  before meals and at bedtime. For agitation/substance withdrawal syndrome 120 capsule 0  . ibuprofen (ADVIL,MOTRIN) 200 MG tablet Take 2 tablets (400 mg total) by mouth every 6 (six) hours as needed for moderate pain. 30 tablet 0  . nicotine (NICODERM CQ - DOSED IN MG/24 HOURS) 21 mg/24hr patch Place 1 patch (21 mg total) onto the skin daily. For nicotine addiction 28 patch 0  . omeprazole (PRILOSEC) 40 MG capsule Take 1 capsule (40 mg total) by mouth daily. 30 capsule 3  . QUEtiapine (SEROQUEL) 25 MG tablet  Take 1 tablet (25 mg total) by mouth 2 (two) times daily. For agitation/sleep 60 tablet 0  . traZODone (DESYREL) 50 MG tablet Take 1 tablet (50 mg total) by mouth at bedtime as needed for sleep (may repeat X 1). For sleep 60 tablet 0  . acamprosate (CAMPRAL) 333 MG tablet Take 2 tablets (666 mg total) by mouth 3 (three) times daily with meals. For alcoholism (Patient not taking: Reported on 09/21/2014) 180 tablet 0  . albuterol (PROVENTIL HFA;VENTOLIN HFA) 108 (90 BASE) MCG/ACT inhaler Inhale 2 puffs into the lungs every 6 (six) hours as needed for wheezing or shortness of breath. (Patient not taking: Reported on 08/24/2014) 1 Inhaler 0  . lipase/protease/amylase (CREON) 12000 UNITS CPEP capsule Take 1 capsule (12,000 Units total) by mouth 3 (three) times daily with meals. For low pancreatic enzymes (Patient not taking: Reported on 10/05/2014) 90 capsule 0  . [DISCONTINUED] ipratropium (ATROVENT) 0.06 % nasal spray Place 2 sprays into both nostrils 4 (four) times daily. 15 mL 0   No current facility-administered medications on file prior to visit.   Family History  Problem Relation Age of Onset  . Stroke Mother   . Heart disease Father   . Heart failure Father   . Heart disease Sister    History   Social History  . Marital Status: Divorced  Spouse Name: N/A  . Number of Children: N/A  . Years of Education: N/A   Occupational History  . Not on file.   Social History Main Topics  . Smoking status: Light Tobacco Smoker -- 0.25 packs/day for 15 years  . Smokeless tobacco: Never Used  . Alcohol Use: No     Comment: couple bottles wine daily when not workking, M-F when working few beers  . Drug Use: No     Comment: THC tiny bowls on week-ends, last use 08/15/14  . 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   Other Topics Concern  . Not on file   Social History Narrative   Has a son and some friends with whom she is close. Was in rehab in 09/2010  and was sober for a few months after.     Review of Systems  Cardiovascular: Positive for leg swelling.  Musculoskeletal: Positive for joint pain.  All other systems reviewed and are negative.      Objective:   Filed Vitals:   10/05/14 1152  BP: 128/78  Pulse: 73    Physical Exam  Constitutional: She is oriented to person, place, and time.  Cardiovascular: Normal rate, regular rhythm and normal heart sounds.   Pulmonary/Chest: Effort normal and breath sounds normal.  Musculoskeletal: She exhibits no edema or tenderness.  Neurological: She is alert and oriented to person, place, and time.  Skin:  Varicose veins of BLE     Lab Results  Component Value Date   WBC 10.9* 08/16/2014   HGB 14.2 08/16/2014   HCT 42.5 08/16/2014   MCV 98.2 08/16/2014   PLT 235 08/16/2014   Lab Results  Component Value Date   CREATININE 0.63 09/21/2014   BUN 14 09/21/2014   NA 140 09/21/2014   K 3.9 09/21/2014   CL 104 09/21/2014   CO2 26 09/21/2014    Lab Results  Component Value Date   HGBA1C 6.0* 11/27/2010   Lipid Panel     Component Value Date/Time   CHOL 140 02/01/2012 0531   TRIG 155* 02/01/2012 0531   HDL 49 09/23/2011 0454   CHOLHDL 3.4 09/23/2011 0454   VLDL 40 09/23/2011 0454   LDLCALC 78 09/23/2011 0454       Assessment and plan:   [name] was seen today for legs/ ankle swelling.  Diagnoses and all orders for this visit:  Arthralgia of both knees Orders: -     AMB referral to orthopedics -     traMADol (ULTRAM) 50 MG tablet; Take 1 tablet (50 mg total) by mouth every 12 (twelve) hours as needed. I will give short course of Tramadol for severe pain until able to get to orthopedics  Varicose veins  I have advised that this can cause swelling and leg pain. Advised her to get compression stockings to help with swelling   May follow up as needed       Chari Manning, [facility] [phone] 10/05/2014, 12:15 PM

## 2014-10-11 ENCOUNTER — Telehealth: Payer: Self-pay

## 2014-10-11 NOTE — Telephone Encounter (Signed)
-----   Message from Lance Bosch, NP sent at 09/28/2014 10:58 AM EDT ----- Labs are within normal limits

## 2014-10-11 NOTE — Telephone Encounter (Signed)
Nurse called patient, reached voicemail. Left message for patient to call [name] with [facility], at [phone].  

## 2014-10-11 NOTE — Telephone Encounter (Signed)
Patient called returning nurse's phone call to review results.

## 2014-10-12 ENCOUNTER — Encounter: Payer: Self-pay | Admitting: Sports Medicine

## 2014-10-12 ENCOUNTER — Ambulatory Visit (INDEPENDENT_AMBULATORY_CARE_PROVIDER_SITE_OTHER): Payer: Managed Care, Other (non HMO) | Admitting: Sports Medicine

## 2014-10-12 VITALS — BP 133/59 | HR 69 | Ht 66.0 in | Wt 160.0 lb

## 2014-10-12 DIAGNOSIS — M25562 Pain in left knee: Secondary | ICD-10-CM | POA: Diagnosis not present

## 2014-10-12 DIAGNOSIS — M25561 Pain in right knee: Secondary | ICD-10-CM | POA: Diagnosis not present

## 2014-10-12 MED ORDER — METHYLPREDNISOLONE ACETATE 40 MG/ML IJ SUSP
40.0000 mg | Freq: Once | INTRAMUSCULAR | Status: AC
  Administered 2014-10-12: 40 mg via INTRA_ARTICULAR

## 2014-10-12 MED ORDER — MELOXICAM 7.5 MG PO TABS: 7.5000 mg | ORAL_TABLET | Freq: Two times a day (BID) | ORAL | Status: DC | PRN

## 2014-10-12 NOTE — Telephone Encounter (Signed)
Nurse called patient, reached voicemail. Left message for patient to call Megin Consalvo with [facility], at [phone].  

## 2014-10-14 DIAGNOSIS — M25561 Pain in right knee: Secondary | ICD-10-CM | POA: Insufficient documentation

## 2014-10-14 DIAGNOSIS — M25562 Pain in left knee: Principal | ICD-10-CM

## 2014-10-14 NOTE — Progress Notes (Signed)
   Subjective:    Patient ID: [name], female    DOB: 06-08-1955, 59 y.o.   MRN: [id number]  HPI [name] is a 59 year old female presents for evaluation of bilateral knee pain. She has noted generalized anterior and medial joint line knee pain for several months. She denies any history of injury. Occasionally the pain will wake her up at night. She denies any fevers, chills, or unexplained weight loss. She tries to remain fairly active, but her knees have been limiting her. She notes associated catching and popping in the bilateral knees. She denies any locking or giving way. She has tried anti-inflammatory is with little relief.  Labs reviewed: Recent Rheumatoid Factor Negative. Family Hx: RA  Past medical history, social history, medications, and allergies were reviewed and are up to date in the chart. Review of Systems 7 point review of systems was performed and was otherwise negative unless noted in the history of present illness.    Objective:   Physical Exam BP 133/59 mmHg  Pulse 69  Ht '5\' 6"'$  (1.676 m)  Wt 160 lb (72.576 kg)  BMI 25.84 kg/m2  LMP 12/08/2006 GEN: The patient is well-developed well-nourished female and in no acute distress.  She is awake alert and oriented x3. SKIN: warm and well-perfused, no rash  EXTR: No lower extremity edema or calf tenderness Neuro: Strength 5/5 globally. Sensation intact throughout.No focal deficits. Vasc: +2 bilateral distal pulses. No edema.  MSK: Examination of the bilateral knees reveals range of motion from 0-120 with mild pain at the implants motion. Bilateral medial joint line tenderness palpation. Positive patellar grind test. No valgus or varus instability. No effusion bilaterally. The quadriceps and patellar tendons are grossly intact. Normal alignment.  Right Knee CST Injection Procedure:  Risks, benefits and complications were reviewed with the patient. After obtaining informed verbal consent the right knee was  sterilely prepped with alcohol.  Ethyl chloride was used to anesthetize the skin and intra-articular injection using a 25-gauge by 1-1/2 inch needle was accomplished without difficulty.  We injected '40mg'$  Depo Medrol with 4 cc 1% lidocaine without epinephrine.  Patient tolerated the procedure well and was observed for 5-10 min. Post injection instructions, including signs and symptoms of complications were discussed.  Left Knee CST Injection Procedure:  Risks, benefits and complications were reviewed with the patient. After obtaining informed verbal consent the left knee was sterilely prepped with alcohol.  Ethyl chloride was used to anesthetize the skin and intra-articular injection using a 25-gauge by 1-1/2 inch needle was accomplished without difficulty.  We injected '40mg'$  Depo Medrol with 4 cc 1% lidocaine without epinephrine.  Patient tolerated the procedure well and was observed for 5-10 min. Post injection instructions, including signs and symptoms of complications were discussed.     Assessment & Plan:  Please see problem based assessment and plan in the problem list.

## 2014-10-14 NOTE — Assessment & Plan Note (Addendum)
-  Patient tolerated bilateral CST injections well today with improvement in pain -Ordered bilateral standing knee x-rays today, will await results -Rest, ice, elevate if needed -meloxicam 7.'5mg'$  bid if needed with food, precautions on frequent use discussed -Plan follow-up 4 weeks or sooner prn

## 2014-10-23 NOTE — Telephone Encounter (Signed)
Nurse called patient, reached voicemail. Left message for patient to call Eloyse Causey with [facility], at [phone].  

## 2014-11-05 ENCOUNTER — Encounter (HOSPITAL_COMMUNITY): Payer: Self-pay | Admitting: Emergency Medicine

## 2014-11-05 ENCOUNTER — Emergency Department (HOSPITAL_COMMUNITY)
Admission: EM | Admit: 2014-11-05 | Discharge: 2014-11-06 | Disposition: A | Discharge status: Home / Self Care | Payer: Managed Care, Other (non HMO) | Attending: Emergency Medicine | Admitting: Emergency Medicine

## 2014-11-05 DIAGNOSIS — K219 Gastro-esophageal reflux disease without esophagitis: Secondary | ICD-10-CM | POA: Diagnosis not present

## 2014-11-05 DIAGNOSIS — F22 Delusional disorders: Secondary | ICD-10-CM | POA: Diagnosis not present

## 2014-11-05 DIAGNOSIS — Z720 Tobacco use: Secondary | ICD-10-CM | POA: Diagnosis not present

## 2014-11-05 DIAGNOSIS — F419 Anxiety disorder, unspecified: Secondary | ICD-10-CM | POA: Insufficient documentation

## 2014-11-05 DIAGNOSIS — F10921 Alcohol use, unspecified with intoxication delirium: Secondary | ICD-10-CM

## 2014-11-05 DIAGNOSIS — F1994 Other psychoactive substance use, unspecified with psychoactive substance-induced mood disorder: Secondary | ICD-10-CM | POA: Diagnosis not present

## 2014-11-05 DIAGNOSIS — F1020 Alcohol dependence, uncomplicated: Secondary | ICD-10-CM | POA: Diagnosis present

## 2014-11-05 DIAGNOSIS — Z79899 Other long term (current) drug therapy: Secondary | ICD-10-CM | POA: Diagnosis not present

## 2014-11-05 DIAGNOSIS — F10121 Alcohol abuse with intoxication delirium: Secondary | ICD-10-CM | POA: Diagnosis not present

## 2014-11-05 DIAGNOSIS — F329 Major depressive disorder, single episode, unspecified: Secondary | ICD-10-CM | POA: Diagnosis not present

## 2014-11-05 DIAGNOSIS — R45851 Suicidal ideations: Secondary | ICD-10-CM | POA: Diagnosis present

## 2014-11-05 MED ORDER — LORAZEPAM 1 MG PO TABS: 0.0000 mg | ORAL_TABLET | Freq: Two times a day (BID) | ORAL | Status: DC

## 2014-11-05 MED ORDER — LORAZEPAM 0.5 MG PO TABS: 1.0000 mg | ORAL_TABLET | Freq: Once | ORAL | Status: DC

## 2014-11-05 MED ORDER — ONDANSETRON 4 MG PO TBDP
4.0000 mg | ORAL_TABLET | Freq: Three times a day (TID) | ORAL | Status: DC | PRN
  Administered 2014-11-05: 4 mg via ORAL
  Filled 2014-11-05: qty 1

## 2014-11-05 MED ORDER — IBUPROFEN 800 MG PO TABS
800.0000 mg | ORAL_TABLET | Freq: Four times a day (QID) | ORAL | Status: DC | PRN
  Administered 2014-11-05: 800 mg via ORAL
  Filled 2014-11-05: qty 1

## 2014-11-05 MED ORDER — LORAZEPAM 1 MG PO TABS
0.0000 mg | ORAL_TABLET | Freq: Four times a day (QID) | ORAL | Status: DC
  Administered 2014-11-05 – 2014-11-06 (×2): 2 mg via ORAL
  Filled 2014-11-05 (×2): qty 2

## 2014-11-05 MED ORDER — LORAZEPAM 2 MG/ML IJ SOLN: 1.0000 mg | Freq: Once | INTRAMUSCULAR | Status: DC

## 2014-11-05 MED ORDER — LORAZEPAM 2 MG/ML IJ SOLN
INTRAMUSCULAR | Status: AC
  Administered 2014-11-05: 1 mg
  Filled 2014-11-05: qty 1

## 2014-11-05 NOTE — ED Notes (Signed)
Pt came to unit belligerent and intrusive.  Cursing staff and yelling to let her out.  Pt is not redirectable.  Prn order obtained and Ativan order obtained and given.  She continues to yell and run halls.  Pt also began undressing and trying to walk through hall naked.  Restraint order obtained.  [employer] and security present throughout entire episode.

## 2014-11-05 NOTE — ED Notes (Signed)
Pt BIB friend.  Pt is obviously intoxicated.  Pt states that she is suicidal with plan to hang herself.  "Just take these cords and kill me".  Pt states that she is an alcoholic.  Friend told her that they were going to go to [employer] to get more wine as a rouse to get pt into the ED.  Pt became agitated and wanted to leave to get more wine.  States "I wanna go be with my kitty cats!".

## 2014-11-05 NOTE — ED Provider Notes (Signed)
CSN: [id number]     Arrival date & time 11/05/14  1608 History   First MD Initiated Contact with Patient 11/05/14 1626     Chief Complaint  Patient presents with  . Suicidal  . Alcohol Intoxication     (Consider location/radiation/quality/duration/timing/severity/associated sxs/prior Treatment) HPI Comments: The patient is a 59 year old female, she has a history significant for severe alcohol abuse, she has been in and out of rehabilitation centers frequently, she was last in rehabilitation approximately 3 months ago during which time she did well, stayed sober for a couple of months however about a week and a half ago she started drinking again. She has been drinking large amounts of wine, she now expresses that she has been suicidal and wants to die, states "I am chronically suicidal". "I do not want to be here anymore", "can you help me die". Her husband brings her in because she has been making these statements today and he is very concerned about her. The patient denies any physical pain difficulty breathing or fevers. She is refusing to let me examine her stating "I am scared of you and other paranoid comments.  Patient is a 59 y.o. female presenting with intoxication. The history is provided by the patient, the spouse and medical records.  Alcohol Intoxication    Past Medical History  Diagnosis Date  . GERD (gastroesophageal reflux disease)   . Rosacea   . Elevated liver function tests   . Wears glasses   . Chronic diarrhea   . Alcohol abuse     H/o withdrawal   . PONV (postoperative nausea and vomiting)   . Pancreatitis   . Depression   . Anxiety    Past Surgical History  Procedure Laterality Date  . Cholecystectomy    . Shoulder surgery  [id number]  . Diagnostic mammogram  2008  . Tubal ligation    . Colonoscopy  Never  . Esophagogastroduodenoscopy  03/21/2011    Procedure: ESOPHAGOGASTRODUODENOSCOPY (EGD);  Surgeon: Scarlette Shorts, MD;  Location: [name] ENDOSCOPY;  Service:  Endoscopy;  Laterality: N/A;  Patient may need to be done at bedside tomorrow depending on status  . Ercp  10/06/2011    Procedure: ENDOSCOPIC RETROGRADE CHOLANGIOPANCREATOGRAPHY (ERCP);  Surgeon: Beryle Beams, MD;  Location: [facility] ENDOSCOPY;  Service: Endoscopy;  Laterality: N/A;  . Laparotomy  01/03/2012    Procedure: EXPLORATORY LAPAROTOMY;  Surgeon: Pedro Earls, MD;  Location: [facility] ORS;  Service: General;  Laterality: N/A;  debridement of necrotic pancreas and placement of drains x2  . Esophagogastroduodenoscopy  01/06/2012    Procedure: ESOPHAGOGASTRODUODENOSCOPY (EGD);  Surgeon: Wonda Horner, MD;  Location: [facility] ENDOSCOPY;  Service: Endoscopy;  Laterality: N/A;  bedside   Family History  Problem Relation Age of Onset  . Stroke Mother   . Heart disease Father   . Heart failure Father   . Heart disease Sister    History  Substance Use Topics  . Smoking status: Light Tobacco Smoker -- 0.25 packs/day for 15 years  . Smokeless tobacco: Never Used  . Alcohol Use: No     Comment: couple bottles wine daily when not workking, M-F when working few beers   OB History    No data available     Review of Systems  All other systems reviewed and are negative.     Allergies  Ciprofloxacin  Home Medications   Prior to Admission medications   Medication Sig Start Date End Date Taking? Authorizing Provider  FLUoxetine (PROZAC) 40 MG capsule  Take 1 capsule (40 mg total) by mouth daily. For depression 08/21/14  Yes Encarnacion Slates, NP  gabapentin (NEURONTIN) 400 MG capsule Take 1 capsule (400 mg total) by mouth 4 (four) times daily -  before meals and at bedtime. For agitation/substance withdrawal syndrome 08/21/14  Yes Encarnacion Slates, NP  lipase/protease/amylase (CREON) 12000 UNITS CPEP capsule Take 1 capsule (12,000 Units total) by mouth 3 (three) times daily with meals. For low pancreatic enzymes 08/21/14  Yes Encarnacion Slates, NP  QUEtiapine (SEROQUEL) 25 MG tablet Take 1 tablet (25 mg total) by  mouth 2 (two) times daily. For agitation/sleep Patient taking differently: Take 25 mg by mouth at bedtime. For agitation/sleep 08/21/14  Yes Encarnacion Slates, NP  traZODone (DESYREL) 50 MG tablet Take 1 tablet (50 mg total) by mouth at bedtime as needed for sleep (may repeat X 1). For sleep 08/21/14  Yes Encarnacion Slates, NP  acamprosate (CAMPRAL) 333 MG tablet Take 2 tablets (666 mg total) by mouth 3 (three) times daily with meals. For alcoholism Patient not taking: Reported on 11/06/2014 08/21/14   Encarnacion Slates, NP  ibuprofen (ADVIL,MOTRIN) 200 MG tablet Take 2 tablets (400 mg total) by mouth every 6 (six) hours as needed for moderate pain. 08/21/14   Encarnacion Slates, NP  meloxicam (MOBIC) 7.5 MG tablet Take 1 tablet (7.5 mg total) by mouth 2 (two) times daily as needed for pain. Patient not taking: Reported on 11/06/2014 10/12/14   Elyse Jarvis Pick-Jacobs, DO  naltrexone (DEPADE) 50 MG tablet  09/25/14   Historical Provider, MD  nicotine (NICODERM CQ - DOSED IN MG/24 HOURS) 21 mg/24hr patch Place 1 patch (21 mg total) onto the skin daily. For nicotine addiction Patient not taking: Reported on 10/12/2014 08/21/14   Encarnacion Slates, NP  omeprazole (PRILOSEC) 40 MG capsule Take 1 capsule (40 mg total) by mouth daily. Patient not taking: Reported on 10/12/2014 08/24/14   Arnoldo Morale, MD  thiamine 100 MG tablet Take 0.5 tablets (50 mg total) by mouth daily. For low thiamine 10/05/14   Lance Bosch, NP  traMADol (ULTRAM) 50 MG tablet Take 1 tablet (50 mg total) by mouth every 12 (twelve) hours as needed. Patient not taking: Reported on 10/12/2014 10/05/14   Lance Bosch, NP   BP 147/80 mmHg  Pulse 83  Temp(Src) 97.8 F (36.6 C) (Oral)  Resp 18  SpO2 97%  LMP 12/08/2006 Physical Exam  Constitutional: She appears well-developed and well-nourished. No distress.  HENT:  Head: Normocephalic and atraumatic.  Mouth/Throat: Oropharynx is clear and moist. No oropharyngeal exudate.  Eyes: Conjunctivae and EOM are normal. Pupils  are equal, round, and reactive to light. Right eye exhibits no discharge. Left eye exhibits no discharge. No scleral icterus.  Neck: Normal range of motion. Neck supple. No JVD present. No thyromegaly present.  Cardiovascular: Normal rate, regular rhythm, normal heart sounds and intact distal pulses.  Exam reveals no gallop and no friction rub.   No murmur heard. Pulmonary/Chest: Effort normal and breath sounds normal. No respiratory distress. She has no wheezes. She has no rales.  Abdominal: Soft. Bowel sounds are normal. She exhibits no distension and no mass. There is no tenderness.  Musculoskeletal: Normal range of motion. She exhibits no edema or tenderness.  Lymphadenopathy:    She has no cervical adenopathy.  Neurological: She is alert. Coordination normal.  Skin: Skin is warm and dry. No rash noted. No erythema.  Psychiatric: She has a normal mood  and affect. Her behavior is normal.  Intoxicated with alcohol, in appropriate facial expressions, not hallucinating but does exhibit some paranoia.  Nursing note and vitals reviewed.   ED Course  Procedures (including critical care time) Labs Review Labs Reviewed  CBC - Abnormal; Notable for the following:    WBC 10.9 (*)    All other components within normal limits  COMPREHENSIVE METABOLIC PANEL - Abnormal; Notable for the following:    BUN 23 (*)    All other components within normal limits  ETHANOL - Abnormal; Notable for the following:    Alcohol, Ethyl (B) 10 (*)    All other components within normal limits  ACETAMINOPHEN LEVEL - Abnormal; Notable for the following:    Acetaminophen (Tylenol), Serum <10 (*)    All other components within normal limits  SALICYLATE LEVEL    Imaging Review No results found.   MDM   Final diagnoses:  None    The patient has made multiple comments of self injury, she is intoxicated with alcohol at this time, she will need to be evaluated when sober, we will obtain psychiatric  consultation, will placed under emergency commitment secondary to the patient's suicidal comments.    Noemi Chapel, MD 11/06/14 1630

## 2014-11-05 NOTE — ED Notes (Signed)
Unable to collect labs at this time because of patient behavior.

## 2014-11-05 NOTE — ED Notes (Signed)
Pt continued to try to escape room while this writer was explaining process.  Myself and pt's visitor put ourselves between pt and the door.  Pt calmed for a moment, asked this writer how much longer I had in my pregnancy and when I answered, pt states, "Good.  I'm going to beat the fuck out of you".  This Probation officer removed myself from the situation, called security and [employer] to room.  Dr. Sabra Heck notified.  Pt is now IVC'ed.

## 2014-11-05 NOTE — ED Notes (Signed)
Restraints released, Pt eating at present. Sitter remains at bedside.  Pt AAO x 3, smell of ETOH noted, Monitoring for safety, Q 15 min checks in effect.

## 2014-11-05 NOTE — [facility] Counselor (Signed)
Writer went to go assess pt but RN told her that she was threatening staff and should not be seen at this time. Writer requested TTS consult be taken out and put back in when she could be assessed appropriately.   Bedelia Person, M.S., LPCA, [place], [facility] Licensed Professional Counselor Associate  Triage Specialist  [facility]  Therapeutic Triage Services Phone: [phone] Fax: [phone]

## 2014-11-06 DIAGNOSIS — F1994 Other psychoactive substance use, unspecified with psychoactive substance-induced mood disorder: Secondary | ICD-10-CM | POA: Diagnosis not present

## 2014-11-06 LAB — COMPREHENSIVE METABOLIC PANEL
ALBUMIN: 3.9 g/dL (ref 3.5–5.0)
ALT: 17 U/L (ref 14–54)
ANION GAP: 10 (ref 5–15)
AST: 21 U/L (ref 15–41)
Alkaline Phosphatase: 58 U/L (ref 38–126)
BILIRUBIN TOTAL: 0.6 mg/dL (ref 0.3–1.2)
BUN: 23 mg/dL — ABNORMAL HIGH (ref 6–20)
CO2: 23 mmol/L (ref 22–32)
CREATININE: 0.72 mg/dL (ref 0.44–1.00)
Calcium: 8.9 mg/dL (ref 8.9–10.3)
Chloride: 105 mmol/L (ref 101–111)
GFR calc Af Amer: >60 mL/min (ref >60)
GFR calc non Af Amer: >60 mL/min (ref >60)
Glucose, Bld: 91 mg/dL (ref 65–99)
Potassium: 3.7 mmol/L (ref 3.5–5.1)
Sodium: 138 mmol/L (ref 135–145)
Total Protein: 7.4 g/dL (ref 6.5–8.1)

## 2014-11-06 LAB — CBC
HEMATOCRIT: 40.8 % (ref 36.0–46.0)
HEMOGLOBIN: 13.3 g/dL (ref 12.0–15.0)
MCH: 30.9 pg (ref 26.0–34.0)
MCHC: 32.6 g/dL (ref 30.0–36.0)
MCV: 94.9 fL (ref 78.0–100.0)
Platelets: 251 10*3/uL (ref 150–400)
RBC: 4.3 MIL/uL (ref 3.87–5.11)
RDW: 13.1 % (ref 11.5–15.5)
WBC: 10.9 10*3/uL — ABNORMAL HIGH (ref 4.0–10.5)

## 2014-11-06 LAB — ACETAMINOPHEN LEVEL

## 2014-11-06 LAB — ETHANOL: Alcohol, Ethyl (B): 10 mg/dL — ABNORMAL HIGH (ref <5)

## 2014-11-06 LAB — SALICYLATE LEVEL

## 2014-11-06 NOTE — [facility] Suicide Risk Assessment (Signed)
Suicide Risk Assessment  Discharge Assessment   [facility] Discharge Suicide Risk Assessment   Demographic Factors:  Caucasian  Total Time spent with patient: 45 minutes  Musculoskeletal: Strength & Muscle Tone: within normal limits Gait & Station: normal Patient leans: N/A  Psychiatric Specialty Exam: Physical Exam  Review of Systems  Constitutional: Negative.   HENT: Negative.   Eyes: Negative.   Respiratory: Negative.   Cardiovascular: Negative.   Gastrointestinal: Negative.   Genitourinary: Negative.   Musculoskeletal: Negative.   Skin: Negative.   Neurological: Negative.   Endo/Heme/Allergies: Negative.   Psychiatric/Behavioral: Positive for substance abuse.    Blood pressure 147/80, pulse 83, temperature 97.8 F (36.6 C), temperature source Oral, resp. rate 18, last menstrual period 12/08/2006, SpO2 97 %.There is no weight on file to calculate BMI.  General Appearance: Disheveled  Eye Contact::  Good  Speech:  Normal Rate  Volume:  Normal  Mood:  Anxious  Affect:  Congruent  Thought Process:  Coherent  Orientation:  Full (Time, Place, and Person)  Thought Content:  WDL  Suicidal Thoughts:  No  Homicidal Thoughts:  No  Memory:  Immediate;   Good Recent;   Fair Remote;   Good  Judgement:  Fair  Insight:  Fair  Psychomotor Activity:  Normal  Concentration:  Good  Recall:  Good  Fund of Knowledge:Good  Language: Good  Akathisia:  No  Handed:  Right  AIMS (if indicated):     Assets:  Housing Leisure Time Physical Health Resilience Social Support  ADL's:  Intact  Cognition: WNL  Sleep:         Has this patient used any form of tobacco in the last 30 days? (Cigarettes, Smokeless Tobacco, Cigars, and/or Pipes) No  Mental Status Per Nursing Assessment::   On Admission:   alcohol intoxication with suicidal ideations  Current Mental Status by Physician: NA  Loss Factors: NA  Historical Factors: NA  Risk Reduction Factors:   Sense of responsibility  to family, Living with another person, especially a relative and Positive social support  Continued Clinical Symptoms:  Anxiety, mild  Cognitive Features That Contribute To Risk:  None    Suicide Risk:  Minimal: No identifiable suicidal ideation.  Patients presenting with no risk factors but with morbid ruminations; may be classified as minimal risk based on the severity of the depressive symptoms  Principal Problem: Substance induced mood disorder Discharge Diagnoses:  Patient Active Problem List   Diagnosis Date Noted  . Substance induced mood disorder [F19.94] 11/06/2014    Priority: High  . Alcohol use disorder, severe, dependence [F10.20] 08/17/2014    Priority: High  . Bilateral knee pain [M25.561, M25.562] 10/14/2014  . Alcohol abuse [F10.10]   . Suicidal behavior [F48.9]   . Wrist laceration [S61.519A]   . Alcohol dependence [F10.20] 10/02/2013  . Alcohol withdrawal [F10.239] 10/02/2013  . Major depression, recurrent [F33.9] 10/02/2013  . Gastric ulcer [K25.9] 01/20/2012  . Fatty liver [K76.0] 10/11/2011  . 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 Elevated liver function tests [R79.89]   . GERD (gastroesophageal reflux disease) [K21.9]   . 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 HIATAL HERNIA [K44.9] 10/01/2008  . FATTY LIVER DISEASE [K76.89] 10/01/2008      Plan Of Care/Follow-up recommendations:  Activity:  as tolerated Diet:  heart healthy diet  Is patient on multiple antipsychotic therapies at discharge:  No   Has Patient had three or more failed trials of antipsychotic monotherapy by history:  No  Recommended Plan  for Multiple Antipsychotic Therapies: NA    Brenten Janney, PMH-NP 11/06/2014, 1:13 PM

## 2014-11-06 NOTE — ED Notes (Signed)
Pt states she feels better.

## 2014-11-06 NOTE — BH Assessment (Addendum)
Tele Assessment Note   [name] is an 59 y.o. female. Brought to ED by her SO due to voicing suicidal ideation. SO told pt he was taking her to [name] to get more wine and instead brought her to ED. Pt reports she had run out of alcohol and had been trying to convince SO to bring her to the store to get more, and when he tricked her and brought her to ED she was furious, and began acting out. She reports she was not drunk up arrival, but very angry. Pt reports she has a history of "numerous" suicide attempts when intoxicated. Pt denies SI when sober but reports when she gets drunk she frequently attempt suicide. In April she attempted suicide while drinking, via cutting her wrist vertically along the vein. At the time of assessment pt has been sleeping for hours, and had been given three doses of Ativan. She was calm and cooperative, with depressed and anxious mood, and constricted affect. She denies current SI, HI, AVH, or self-harm. She denies abusing drugs. She reports she has been drinking etoh since age 2, and was recently sober for 65 days, and then began drinking a bottle of wine a day, a couple of weeks ago. She reports hx of seizure with withdrawal "years" ago. Pt was attending IOP at the [facility] but stopped going, and stopped her medications when she relapsed a couple of weeks ago. Pt reports she is not interested in treatment for her alcohol abuse at this time.Pt denies hx of bipolar, denies current depressive sx. She reports hx of anxiety, frequent worry and panic attacks with unpredictable triggers. She denies hx of abuse or neglect. Family hx is positive for etoh abuse by her father.   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 GERD (gastroesophageal reflux disease)   . Rosacea   . Elevated liver function  tests   . Wears glasses   . Chronic diarrhea   . Alcohol abuse     H/o withdrawal   . PONV (postoperative nausea and vomiting)   . Pancreatitis   . Depression   . Anxiety     Past Surgical History  Procedure Laterality Date  . Cholecystectomy    . Shoulder surgery  [id number]  . Diagnostic mammogram  2008  . Tubal ligation    . Colonoscopy  Never  . Esophagogastroduodenoscopy  03/21/2011    Procedure: ESOPHAGOGASTRODUODENOSCOPY (EGD);  Surgeon: Scarlette Shorts, MD;  Location: [facility] ENDOSCOPY;  Service: Endoscopy;  Laterality: N/A;  Patient may need to be done at bedside tomorrow depending on status  . Ercp  10/06/2011    Procedure: ENDOSCOPIC RETROGRADE CHOLANGIOPANCREATOGRAPHY (ERCP);  Surgeon: Beryle Beams, MD;  Location: [facility] ENDOSCOPY;  Service: Endoscopy;  Laterality: N/A;  . Laparotomy  01/03/2012    Procedure: EXPLORATORY LAPAROTOMY;  Surgeon: Pedro Earls, MD;  Location: [facility] ORS;  Service: General;  Laterality: N/A;  debridement of necrotic pancreas and placement of drains x2  . Esophagogastroduodenoscopy  01/06/2012    Procedure: ESOPHAGOGASTRODUODENOSCOPY (EGD);  Surgeon: Wonda Horner, MD;  Location: [facility] ENDOSCOPY;  Service: Endoscopy;  Laterality: N/A;  bedside    Family History:  Family History  Problem Relation Age of Onset  . Stroke Mother   . Heart disease Father   . Heart failure Father   . Heart disease Sister  Social History:  reports that she has been smoking.  She has never used smokeless tobacco. She reports that she does not drink alcohol or use illicit drugs.  Additional Social History:  Alcohol / Drug Use Pain Medications: See PTA Prescriptions: See PTA, reports she stopped taking her medication a couple of weeks ago when she began drinking again  Over the Counter: See PTA  History of alcohol / drug use?: Yes Longest period of sobriety (when/how long): 15 years, more recently 62 days, reports hx of seizures but none in years  Negative Consequences of Use:  Personal relationships Withdrawal Symptoms: Agitation, Aggressive/Assaultive, Nausea / Vomiting Substance #1 Name of Substance 1: etoh  1 - Age of First Use: 13 1 - Amount (size/oz): a bottle of wine 1 - Frequency: daily when drinking  1 - Duration: on and off for more than 40 years  1 - Last Use / Amount: 11-05-14 a bottle of wine   CIWA: CIWA-Ar BP: (!) 131/51 mmHg Pulse Rate: 72 Nausea and Vomiting: mild nausea with no vomiting Tactile Disturbances: mild itching, pins and needles, burning or numbness Tremor: two Auditory Disturbances: not present Paroxysmal Sweats: barely perceptible sweating, palms moist Visual Disturbances: not present Anxiety: two Headache, Fullness in Head: very mild Agitation: two Orientation and Clouding of Sensorium: oriented and can do serial additions CIWA-Ar Total: 11 COWS:    PATIENT STRENGTHS: (choose at least two) Communication skills Work skills  Allergies:  Allergies  Allergen Reactions  . Ciprofloxacin Nausea Only    Home Medications:  (Not in a hospital admission)  OB/GYN Status:  Patient's last menstrual period was 12/08/2006.  General Assessment Data Location of Assessment: [facility] TTS Assessment: In system Is this a Tele or Face-to-Face Assessment?: Face-to-Face Is this an Initial Assessment or a Re-assessment for this encounter?: Initial Assessment Marital status: Long term relationship Is patient pregnant?: No Pregnancy Status: No Living Arrangements: Alone Can pt return to current living arrangement?: Yes Admission Status: Involuntary Is patient capable of signing voluntary admission?: No Referral Source: Self/Family/Friend Insurance type: [place] Living Arrangements: Alone Name of Psychiatrist: Was going to [facility] but stopped a couple of weeks ago  Name of Therapist: [facility] but stopped attending   Education Status Is patient currently in school?: No Current Grade:  NA Highest grade of school patient has completed: BS in accounting  Name of school: NA Contact person: NA  Risk to self with the past 6 months Suicidal Ideation: No-Not Currently/Within Last 6 Months Has patient been a risk to self within the past 6 months prior to admission? : Yes Suicidal Intent: No-Not Currently/Within Last 6 Months Has patient had any suicidal intent within the past 6 months prior to admission? : Yes Is patient at risk for suicide?: Yes Suicidal Plan?: No-Not Currently/Within Last 6 Months Has patient had any suicidal plan within the past 6 months prior to admission? : Yes Access to Means: Yes Specify Access to Suicidal Means: pt was thinking of hanging herself  What has been your use of drugs/alcohol within the last 12 months?: Pt has been abusing alcohol for 40 + years, was recently sober for 65 days and then relapsed  Previous Attempts/Gestures: Yes How many times?:  ("numerous times") Other Self Harm Risks: heavy drinking  Triggers for Past Attempts: Other (Comment) (alcohol abuse ) Intentional Self Injurious Behavior: None Family Suicide History: No Recent stressful life event(s): Loss (Comment) (mom died last 04/01/23 ) Persecutory  voices/beliefs?: No Depression: Yes Depression Symptoms:  (suicidal ideation ) Substance abuse history and/or treatment for substance abuse?: Yes Suicide prevention information given to non-admitted patients: Not applicable  Risk to Others within the past 6 months Homicidal Ideation: No Does patient have any lifetime risk of violence toward others beyond the six months prior to admission? : No (was making threats in triage) Thoughts of Harm to Others: No-Not Currently Present/Within Last 6 Months Current Homicidal Intent: No Current Homicidal Plan: No Access to Homicidal Means: No Identified Victim: none History of harm to others?: No Assessment of Violence: On admission Violent Behavior Description: was making threats in  triage  Does patient have access to weapons?: No Criminal Charges Pending?: No Does patient have a court date: No Is patient on probation?: No  Psychosis Hallucinations: None noted Delusions: None noted  Mental Status Report Appearance/Hygiene: Disheveled, In scrubs Eye Contact: Good Motor Activity: Unremarkable Speech: Logical/coherent Level of Consciousness: Alert Mood: Depressed Affect: Constricted Anxiety Level: Moderate Thought Processes: Coherent, Relevant Judgement: Impaired Orientation: Person, Place, Time, Situation Obsessive Compulsive Thoughts/Behaviors: None  Cognitive Functioning Concentration: Normal Memory: Recent Intact, Remote Intact IQ: Average Insight: Fair Impulse Control: Poor Appetite: Good Weight Loss: 0 Weight Gain: 0 Sleep: No Change Total Hours of Sleep: 4 (4-5 trouble sleeping for years ) Vegetative Symptoms: None  ADLScreening [facility] Assessment Services) Patient's cognitive ability adequate to safely complete daily activities?: Yes Patient able to express need for assistance with ADLs?: Yes Independently performs ADLs?: Yes (appropriate for developmental age)  Prior Inpatient Therapy Prior Inpatient Therapy: Yes Prior Therapy Dates: multiple times Prior Therapy Facilty/Provider(s): [facility] Reason for Treatment: SA  Prior Outpatient Therapy Prior Outpatient Therapy: Yes Prior Therapy Dates: was going up until a few weeks ago  Prior Therapy Facilty/Provider(s): [facility] Reason for Treatment: IOP Does patient have an ACCT team?: No Does patient have Intensive In-House Services?  : No Does patient have [facility] services? : No Does patient have P4CC services?: No  ADL Screening (condition at time of admission) Patient's cognitive ability adequate to safely complete daily activities?: Yes Is the patient deaf or have difficulty hearing?: No Does the patient have difficulty seeing, even when wearing glasses/contacts?: No Does the  patient have difficulty concentrating, remembering, or making decisions?: No Patient able to express need for assistance with ADLs?: Yes Does the patient have difficulty dressing or bathing?: No Independently performs ADLs?: Yes (appropriate for developmental age) Does the patient have difficulty walking or climbing stairs?: No Weakness of Legs: None Weakness of Arms/Hands: None  Home Assistive Devices/Equipment Home Assistive Devices/Equipment: None    Abuse/Neglect Assessment (Assessment to be complete while patient is alone) Physical Abuse: Denies Verbal Abuse: Denies Sexual Abuse: Denies Exploitation of patient/patient's resources: Denies Self-Neglect: Denies Values / Beliefs Cultural Requests During Hospitalization: None Spiritual Requests During Hospitalization: None   Advance Directives (For Healthcare) Does patient have an advance directive?: No Would patient like information on creating an advanced directive?: No - patient declined information Nutrition Screen- [facility] Patient's home diet: Regular Has the patient recently lost weight without trying?: No Has the patient been eating poorly because of a decreased appetite?: No Malnutrition Screening Tool Score: 0  Additional Information 1:1 In Past 12 Months?: No CIRT Risk: No Elopement Risk: No Does patient have medical clearance?: No     Disposition:  Per Patriciaann Clan, PA am psych evaluation for final disposition. Informed Pt and RN.  Informed Dr. Sharol Given and she is in agreement.   Disposition  Initial Assessment Completed for this Encounter: Yes  Alaylah Heatherington M 11/06/2014 6:48 AM

## 2014-11-06 NOTE — ED Notes (Signed)
Pt nauseated at present. Dry heaves noted, flushed stating she feels bad.  Lab called to obtain initial labs.

## 2014-11-06 NOTE — BH Assessment (Addendum)
Pt asleep after given second Ativan, as she had been complaining of etoh withdrawal sx. Will attempt to assess pt when she is more alert later in AM.   [id number] still sleeping soundly.  Lear Ng, [name] Triage Specialist 11/06/2014 1:30 AM

## 2014-11-06 NOTE — ED Notes (Signed)
Restraints released, pt stating she wants to eat and that she will cooperate with staff.  Tech Michelle at bedside to monitor pt.

## 2014-11-06 NOTE — Consult Note (Signed)
[facility] Consult   Reason for Consult:  Alcohol intoxication with suicidal ideations Referring Physician:  EDP Patient Identification: [name] MRN:  [id number] Principal Diagnosis: Substance induced mood disorder Diagnosis:   Patient Active Problem List   Diagnosis Date Noted  . Substance induced mood disorder [F19.94] 11/06/2014    Priority: High  . Alcohol use disorder, severe, dependence [F10.20] 08/17/2014    Priority: High  . Bilateral knee pain [M25.561, M25.562] 10/14/2014  . Alcohol abuse [F10.10]   . Suicidal behavior [F48.9]   . Wrist laceration [S61.519A]   . Alcohol dependence [F10.20] 10/02/2013  . Alcohol withdrawal [F10.239] 10/02/2013  . Major depression, recurrent [F33.9] 10/02/2013  . Gastric ulcer [K25.9] 01/20/2012  . Fatty liver [K76.0] 10/11/2011  . 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 Elevated liver function tests [R79.89]   . GERD (gastroesophageal reflux disease) [K21.9]   . 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 HIATAL HERNIA [K44.9] 10/01/2008  . FATTY LIVER DISEASE [K76.89] 10/01/2008    Total Time spent with patient: 45 minutes  Subjective:   [name] is a 59 y.o. female patient does not want alcohol detox, rehab, or mental health assistance.  HPI:  The patient  HPI Elements:   Location:  generalized. Quality:  chronic. Severity:  moderate. Timing:  constant . Duration:  years. Context:  stressors.  Past Medical History:  Past Medical History  Diagnosis Date  . GERD (gastroesophageal reflux disease)   . Rosacea   . Elevated liver function tests   . Wears glasses   . Chronic diarrhea   . Alcohol abuse     H/o withdrawal   . PONV (postoperative nausea and vomiting)   . Pancreatitis   . Depression   . Anxiety     Past Surgical History  Procedure Laterality Date  . Cholecystectomy    . Shoulder surgery  [id number]  . Diagnostic mammogram  2008  . Tubal ligation    .  Colonoscopy  Never  . Esophagogastroduodenoscopy  03/21/2011    Procedure: ESOPHAGOGASTRODUODENOSCOPY (EGD);  Surgeon: Scarlette Shorts, MD;  Location[name] ENDOSCOPY;  Service: Endoscopy;  Laterality: N/A;  Patient may need to be done at bedside tomorrow depending on status  . Ercp  10/06/2011    Procedure: ENDOSCOPIC RETROGRADE CHOLANGIOPANCREATOGRAPHY (ERCP);  Surgeon: Beryle Beams, MD;  Location: [facility] ENDOSCOPY;  Service: Endoscopy;  Laterality: N/A;  . Laparotomy  01/03/2012    Procedure: EXPLORATORY LAPAROTOMY;  Surgeon: Pedro Earls, MD;  Location: [facility] ORS;  Service: General;  Laterality: N/A;  debridement of necrotic pancreas and placement of drains x2  . Esophagogastroduodenoscopy  01/06/2012    Procedure: ESOPHAGOGASTRODUODENOSCOPY (EGD);  Surgeon: Wonda Horner, MD;  Location: [facility] ENDOSCOPY;  Service: Endoscopy;  Laterality: N/A;  bedside   Family History:  Family History  Problem Relation Age of Onset  . Stroke Mother   . Heart disease Father   . Heart failure Father   . Heart disease Sister    Social History:  History  Alcohol Use No    Comment: couple bottles wine daily when not workking, M-F when working few beers     History  Drug Use No    Comment: THC tiny bowls on week-ends, last use 08/15/14    History   Social History  . Marital Status: Divorced    Spouse Name: N/A  . Number of Children: N/A  . Years of Education: N/A   Social History Main Topics  . Smoking status: Light  Tobacco Smoker -- 0.25 packs/day for 15 years  . Smokeless tobacco: Never Used  . Alcohol Use: No     Comment: couple bottles wine daily when not workking, M-F when working few beers  . Drug Use: No     Comment: THC tiny bowls on week-ends, last use 08/15/14  . 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   Other Topics Concern  . None   Social History Narrative   Has a son and some friends with whom she is close. Was in rehab in 09/2010 and was sober for  a few months after.    Additional Social History:    Pain Medications: See PTA Prescriptions: See PTA, reports she stopped taking her medication a couple of weeks ago when she began drinking again  Over the Counter: See PTA  History of alcohol / drug use?: Yes Longest period of sobriety (when/how long): 15 years, more recently 32 days, reports hx of seizures but none in years  Negative Consequences of Use: Personal relationships Withdrawal Symptoms: Agitation, Aggressive/Assaultive, Nausea / Vomiting Name of Substance 1: etoh  1 - Age of First Use: 13 1 - Amount (size/oz): a bottle of wine 1 - Frequency: daily when drinking  1 - Duration: on and off for more than 40 years  1 - Last Use / Amount: 11-05-14 a bottle of wine                    Allergies:   Allergies  Allergen Reactions  . Ciprofloxacin Nausea Only    Labs:  Results for orders placed or performed during the hospital encounter of 11/05/14 (from the past 48 hour(s))  CBC     Status: Abnormal   Collection Time: 11/06/14 12:30 AM  Result Value Ref Range   WBC 10.9 (H) 4.0 - 10.5 K/uL   RBC 4.30 3.87 - 5.11 MIL/uL   Hemoglobin 13.3 12.0 - 15.0 g/dL   HCT 40.8 36.0 - 46.0 %   MCV 94.9 78.0 - 100.0 fL   MCH 30.9 26.0 - 34.0 pg   MCHC 32.6 30.0 - 36.0 g/dL   RDW 13.1 11.5 - 15.5 %   Platelets 251 150 - 400 K/uL  Comprehensive metabolic panel     Status: Abnormal   Collection Time: 11/06/14 12:30 AM  Result Value Ref Range   Sodium 138 135 - 145 mmol/L   Potassium 3.7 3.5 - 5.1 mmol/L   Chloride 105 101 - 111 mmol/L   CO2 23 22 - 32 mmol/L   Glucose, Bld 91 65 - 99 mg/dL   BUN 23 (H) 6 - 20 mg/dL   Creatinine, Ser 0.72 0.44 - 1.00 mg/dL   Calcium 8.9 8.9 - 10.3 mg/dL   Total Protein 7.4 6.5 - 8.1 g/dL   Albumin 3.9 3.5 - 5.0 g/dL   AST 21 15 - 41 U/L   ALT 17 14 - 54 U/L   Alkaline Phosphatase 58 38 - 126 U/L   Total Bilirubin 0.6 0.3 - 1.2 mg/dL   GFR calc non Af Amer >60 >60 mL/min   GFR calc Af  Amer >60 >60 mL/min    Comment: (NOTE) The eGFR has been calculated using the CKD EPI equation. This calculation has not been validated in all clinical situations. eGFR's persistently <60 mL/min signify possible Chronic Kidney Disease.    Anion gap 10 5 - 15  Ethanol     Status:  Abnormal   Collection Time: 11/06/14 12:30 AM  Result Value Ref Range   Alcohol, Ethyl (B) 10 (H) <5 mg/dL    Comment:        LOWEST DETECTABLE LIMIT FOR SERUM ALCOHOL IS 5 mg/dL FOR MEDICAL PURPOSES ONLY   Acetaminophen level     Status: Abnormal   Collection Time: 11/06/14 12:30 AM  Result Value Ref Range   Acetaminophen (Tylenol), Serum <10 (L) 10 - 30 ug/mL    Comment:        THERAPEUTIC CONCENTRATIONS VARY SIGNIFICANTLY. A RANGE OF 10-30 ug/mL MAY BE AN EFFECTIVE CONCENTRATION FOR MANY PATIENTS. HOWEVER, SOME ARE BEST TREATED AT CONCENTRATIONS OUTSIDE THIS RANGE. ACETAMINOPHEN CONCENTRATIONS >150 ug/mL AT 4 HOURS AFTER INGESTION AND >50 ug/mL AT 12 HOURS AFTER INGESTION ARE OFTEN ASSOCIATED WITH TOXIC REACTIONS.   Salicylate level     Status: None   Collection Time: 11/06/14 12:30 AM  Result Value Ref Range   Salicylate Lvl <9.7 2.8 - 30.0 mg/dL    Vitals: Blood pressure 147/80, pulse 83, temperature 97.8 F (36.6 C), temperature source Oral, resp. rate 18, last menstrual period 12/08/2006, SpO2 97 %.  Risk to Self: Suicidal Ideation: No-Not Currently/Within Last 6 Months Suicidal Intent: No-Not Currently/Within Last 6 Months Is patient at risk for suicide?: Yes Suicidal Plan?: No-Not Currently/Within Last 6 Months Access to Means: Yes Specify Access to Suicidal Means: pt was thinking of hanging herself  What has been your use of drugs/alcohol within the last 12 months?: Pt has been abusing alcohol for 40 + years, was recently sober for 65 days and then relapsed  How many times?:  ("numerous times") Other Self Harm Risks: heavy drinking  Triggers for Past Attempts: Other (Comment)  (alcohol abuse ) Intentional Self Injurious Behavior: None Risk to Others: Homicidal Ideation: No Thoughts of Harm to Others: No-Not Currently Present/Within Last 6 Months Current Homicidal Intent: No Current Homicidal Plan: No Access to Homicidal Means: No Identified Victim: none History of harm to others?: No Assessment of Violence: On admission Violent Behavior Description: was making threats in triage  Does patient have access to weapons?: No Criminal Charges Pending?: No Does patient have a court date: No Prior Inpatient Therapy: Prior Inpatient Therapy: Yes Prior Therapy Dates: multiple times Prior Therapy Facilty/Provider(s): [facility] Reason for Treatment: SA Prior Outpatient Therapy: Prior Outpatient Therapy: Yes Prior Therapy Dates: was going up until a few weeks ago  Prior Therapy Facilty/Provider(s): [facility] Reason for Treatment: IOP Does patient have an ACCT team?: No Does patient have Intensive In-House Services?  : No Does patient have [facility] services? : No Does patient have P4CC services?: No  Current Facility-Administered Medications  Medication Dose Route Frequency Provider Last Rate Last Dose  . ibuprofen (ADVIL,MOTRIN) tablet 800 mg  800 mg Oral Q6H PRN Noemi Chapel, MD   800 mg at 11/05/14 2138  . LORazepam (ATIVAN) tablet 1 mg  1 mg Oral Once Delfin Gant, NP       Or  . LORazepam (ATIVAN) injection 1 mg  1 mg Intramuscular Once Delfin Gant, NP      . LORazepam (ATIVAN) tablet 0-4 mg  0-4 mg Oral 4 times per day Noemi Chapel, MD   2 mg at 11/06/14 0555   Followed by  . [START ON 11/07/2014] LORazepam (ATIVAN) tablet 0-4 mg  0-4 mg Oral Q12H Noemi Chapel, MD      . ondansetron (ZOFRAN-ODT) disintegrating tablet 4 mg  4 mg Oral Q8H PRN  Noemi Chapel, MD   4 mg at 11/05/14 2341   Current Outpatient Prescriptions  Medication Sig Dispense Refill  . FLUoxetine (PROZAC) 40 MG capsule Take 1 capsule (40 mg total) by mouth daily. For depression  30 capsule 0  . gabapentin (NEURONTIN) 400 MG capsule Take 1 capsule (400 mg total) by mouth 4 (four) times daily -  before meals and at bedtime. For agitation/substance withdrawal syndrome 120 capsule 0  . lipase/protease/amylase (CREON) 12000 UNITS CPEP capsule Take 1 capsule (12,000 Units total) by mouth 3 (three) times daily with meals. For low pancreatic enzymes 90 capsule 0  . QUEtiapine (SEROQUEL) 25 MG tablet Take 1 tablet (25 mg total) by mouth 2 (two) times daily. For agitation/sleep (Patient taking differently: Take 25 mg by mouth at bedtime. For agitation/sleep) 60 tablet 0  . traZODone (DESYREL) 50 MG tablet Take 1 tablet (50 mg total) by mouth at bedtime as needed for sleep (may repeat X 1). For sleep 60 tablet 0  . acamprosate (CAMPRAL) 333 MG tablet Take 2 tablets (666 mg total) by mouth 3 (three) times daily with meals. For alcoholism (Patient not taking: Reported on 11/06/2014) 180 tablet 0  . ibuprofen (ADVIL,MOTRIN) 200 MG tablet Take 2 tablets (400 mg total) by mouth every 6 (six) hours as needed for moderate pain. 30 tablet 0  . meloxicam (MOBIC) 7.5 MG tablet Take 1 tablet (7.5 mg total) by mouth 2 (two) times daily as needed for pain. (Patient not taking: Reported on 11/06/2014) 30 tablet 1  . naltrexone (DEPADE) 50 MG tablet     . nicotine (NICODERM CQ - DOSED IN MG/24 HOURS) 21 mg/24hr patch Place 1 patch (21 mg total) onto the skin daily. For nicotine addiction (Patient not taking: Reported on 10/12/2014) 28 patch 0  . omeprazole (PRILOSEC) 40 MG capsule Take 1 capsule (40 mg total) by mouth daily. (Patient not taking: Reported on 10/12/2014) 30 capsule 3  . thiamine 100 MG tablet Take 0.5 tablets (50 mg total) by mouth daily. For low thiamine 30 tablet 0  . traMADol (ULTRAM) 50 MG tablet Take 1 tablet (50 mg total) by mouth every 12 (twelve) hours as needed. (Patient not taking: Reported on 10/12/2014) 30 tablet 0  . [DISCONTINUED] ipratropium (ATROVENT) 0.06 % nasal spray Place 2  sprays into both nostrils 4 (four) times daily. 15 mL 0    Musculoskeletal: Strength & Muscle Tone: within normal limits Gait & Station: normal Patient leans: N/A  Psychiatric Specialty Exam: Physical Exam  Review of Systems  Constitutional: Negative.   HENT: Negative.   Eyes: Negative.   Respiratory: Negative.   Cardiovascular: Negative.   Gastrointestinal: Negative.   Genitourinary: Negative.   Musculoskeletal: Negative.   Skin: Negative.   Neurological: Negative.   Endo/Heme/Allergies: Negative.   Psychiatric/Behavioral: Positive for substance abuse.    Blood pressure 147/80, pulse 83, temperature 97.8 F (36.6 C), temperature source Oral, resp. rate 18, last menstrual period 12/08/2006, SpO2 97 %.There is no weight on file to calculate BMI.  General Appearance: Disheveled  Eye Contact::  Good  Speech:  Normal Rate  Volume:  Normal  Mood:  Anxious  Affect:  Congruent  Thought Process:  Coherent  Orientation:  Full (Time, Place, and Person)  Thought Content:  WDL  Suicidal Thoughts:  No  Homicidal Thoughts:  No  Memory:  Immediate;   Good Recent;   Fair Remote;   Good  Judgement:  Fair  Insight:  Fair  Psychomotor Activity:  Normal  Concentration:  Good  Recall:  Good  Fund of Knowledge:Good  Language: Good  Akathisia:  No  Handed:  Right  AIMS (if indicated):     Assets:  Housing Leisure Time Physical Health Resilience Social Support  ADL's:  Intact  Cognition: WNL  Sleep:      Medical Decision Making: Review of Psycho-Social Stressors (1), Review or order clinical lab tests (1) and Review of Medication Regimen & Side Effects (2)  Treatment Plan Summary:  Diagnosis:  Alcohol dependence with uncomplicated withdrawal, substance induced mood disorder Ativan Detox Protocol implemented in the ED Daily contact with patient to assess and evaluate symptoms and progress in treatment, Medication management and Plan discharge home, refused substance abuse and  mental health resources, counseling attempted  Plan:  No evidence of imminent risk to self or others at present.   Disposition: Discharge  Waylan Boga, [place] 11/06/2014 1:06 PM Patient seen face-to-face for psychiatric evaluation, chart reviewed and case discussed with the physician extender and developed treatment plan. Reviewed the information documented and agree with the treatment plan. Corena Pilgrim, MD

## 2014-11-08 ENCOUNTER — Emergency Department
Admission: EM | Admit: 2014-11-08 | Discharge: 2014-11-10 | Disposition: A | Discharge status: Other Acute Inpatient Hospital | Payer: Managed Care, Other (non HMO) | Attending: Emergency Medicine | Admitting: Emergency Medicine

## 2014-11-08 ENCOUNTER — Encounter: Payer: Self-pay | Admitting: Emergency Medicine

## 2014-11-08 DIAGNOSIS — F332 Major depressive disorder, recurrent severe without psychotic features: Secondary | ICD-10-CM | POA: Diagnosis not present

## 2014-11-08 DIAGNOSIS — F339 Major depressive disorder, recurrent, unspecified: Secondary | ICD-10-CM | POA: Diagnosis present

## 2014-11-08 DIAGNOSIS — F1020 Alcohol dependence, uncomplicated: Secondary | ICD-10-CM | POA: Diagnosis present

## 2014-11-08 DIAGNOSIS — F1210 Cannabis abuse, uncomplicated: Secondary | ICD-10-CM | POA: Insufficient documentation

## 2014-11-08 DIAGNOSIS — F10129 Alcohol abuse with intoxication, unspecified: Secondary | ICD-10-CM | POA: Diagnosis not present

## 2014-11-08 DIAGNOSIS — F1994 Other psychoactive substance use, unspecified with psychoactive substance-induced mood disorder: Secondary | ICD-10-CM | POA: Diagnosis present

## 2014-11-08 DIAGNOSIS — Z79899 Other long term (current) drug therapy: Secondary | ICD-10-CM | POA: Diagnosis not present

## 2014-11-08 DIAGNOSIS — F10939 Alcohol use, unspecified with withdrawal, unspecified: Secondary | ICD-10-CM | POA: Diagnosis present

## 2014-11-08 DIAGNOSIS — F1024 Alcohol dependence with alcohol-induced mood disorder: Secondary | ICD-10-CM | POA: Diagnosis not present

## 2014-11-08 DIAGNOSIS — F10239 Alcohol dependence with withdrawal, unspecified: Secondary | ICD-10-CM | POA: Diagnosis present

## 2014-11-08 DIAGNOSIS — F10929 Alcohol use, unspecified with intoxication, unspecified: Secondary | ICD-10-CM

## 2014-11-08 DIAGNOSIS — F1310 Sedative, hypnotic or anxiolytic abuse, uncomplicated: Secondary | ICD-10-CM | POA: Insufficient documentation

## 2014-11-08 DIAGNOSIS — Z720 Tobacco use: Secondary | ICD-10-CM | POA: Insufficient documentation

## 2014-11-08 DIAGNOSIS — R45851 Suicidal ideations: Secondary | ICD-10-CM | POA: Diagnosis present

## 2014-11-08 LAB — URINE DRUG SCREEN, QUALITATIVE (ARMC ONLY)
Amphetamines, Ur Screen: NOT DETECTED
BENZODIAZEPINE, UR SCRN: POSITIVE — AB
Barbiturates, Ur Screen: NOT DETECTED
Cannabinoid 50 Ng, Ur ~~LOC~~: POSITIVE — AB
Cocaine Metabolite,Ur ~~LOC~~: NOT DETECTED
MDMA (Ecstasy)Ur Screen: NOT DETECTED
Methadone Scn, Ur: NOT DETECTED
Opiate, Ur Screen: NOT DETECTED
Phencyclidine (PCP) Ur S: NOT DETECTED
Tricyclic, Ur Screen: NOT DETECTED

## 2014-11-08 LAB — CBC
HCT: 44.1 % (ref 35.0–47.0)
HEMOGLOBIN: 14.8 g/dL (ref 12.0–16.0)
MCH: 31 pg (ref 26.0–34.0)
MCHC: 33.6 g/dL (ref 32.0–36.0)
MCV: 92.4 fL (ref 80.0–100.0)
Platelets: 283 10*3/uL (ref 150–440)
RBC: 4.77 MIL/uL (ref 3.80–5.20)
RDW: 12.9 % (ref 11.5–14.5)
WBC: 11.7 10*3/uL — ABNORMAL HIGH (ref 3.6–11.0)

## 2014-11-08 LAB — COMPREHENSIVE METABOLIC PANEL
ALT: 17 U/L (ref 14–54)
ANION GAP: 13 (ref 5–15)
AST: 22 U/L (ref 15–41)
Albumin: 4.2 g/dL (ref 3.5–5.0)
Alkaline Phosphatase: 67 U/L (ref 38–126)
BUN: 15 mg/dL (ref 6–20)
CHLORIDE: 105 mmol/L (ref 101–111)
CO2: 24 mmol/L (ref 22–32)
Calcium: 8.9 mg/dL (ref 8.9–10.3)
Creatinine, Ser: 0.6 mg/dL (ref 0.44–1.00)
GFR calc Af Amer: >60 mL/min (ref >60)
GFR calc non Af Amer: >60 mL/min (ref >60)
GLUCOSE: 102 mg/dL — AB (ref 65–99)
Potassium: 4.1 mmol/L (ref 3.5–5.1)
Sodium: 142 mmol/L (ref 135–145)
Total Bilirubin: 0.1 mg/dL — ABNORMAL LOW (ref 0.3–1.2)
Total Protein: 7.9 g/dL (ref 6.5–8.1)

## 2014-11-08 LAB — ETHANOL: ALCOHOL ETHYL (B): 270 mg/dL — AB (ref <5)

## 2014-11-08 LAB — ACETAMINOPHEN LEVEL

## 2014-11-08 LAB — SALICYLATE LEVEL: Salicylate Lvl: <4 mg/dL (ref 2.8–30.0)

## 2014-11-08 MED ORDER — LORAZEPAM 2 MG PO TABS
2.0000 mg | ORAL_TABLET | Freq: Once | ORAL | Status: AC
  Administered 2014-11-08: 2 mg via ORAL
  Filled 2014-11-08: qty 1

## 2014-11-08 MED ORDER — THIAMINE HCL 100 MG/ML IJ SOLN
100.0000 mg | Freq: Every day | INTRAMUSCULAR | Status: DC
Start: 2014-11-08 — End: 2014-11-10

## 2014-11-08 MED ORDER — LORAZEPAM 2 MG PO TABS
0.0000 mg | ORAL_TABLET | Freq: Two times a day (BID) | ORAL | Status: DC
Start: 2014-11-08 — End: 2014-11-10
  Administered 2014-11-09: 1 mg via ORAL

## 2014-11-08 MED ORDER — VITAMIN B-1 100 MG PO TABS
100.0000 mg | ORAL_TABLET | Freq: Every day | ORAL | Status: DC
  Administered 2014-11-08 – 2014-11-10 (×3): 100 mg via ORAL
  Filled 2014-11-08 (×3): qty 1

## 2014-11-08 MED ORDER — LORAZEPAM 2 MG/ML IJ SOLN: 0.0000 mg | Freq: Two times a day (BID) | INTRAMUSCULAR | Status: DC

## 2014-11-08 MED ORDER — LORAZEPAM 2 MG/ML IJ SOLN: 0.0000 mg | Freq: Four times a day (QID) | INTRAMUSCULAR | Status: DC

## 2014-11-08 MED ORDER — LORAZEPAM 2 MG PO TABS
0.0000 mg | ORAL_TABLET | Freq: Four times a day (QID) | ORAL | Status: DC
Start: 2014-11-08 — End: 2014-11-10
  Administered 2014-11-09: 1 mg via ORAL
  Administered 2014-11-09: 2 mg via ORAL
  Administered 2014-11-09: 1 mg via ORAL
  Administered 2014-11-10 (×2): 2 mg via ORAL
  Filled 2014-11-08 (×3): qty 1

## 2014-11-08 NOTE — ED Notes (Signed)
ED BHU Diagonal Is the patient under IVC or is there intent for IVC: No. Is the patient medically cleared: Yes.   Is there vacancy in the ED BHU: Yes.   Is the population mix appropriate for patient: Yes.   Is the patient awaiting placement in inpatient or outpatient setting: No. waiting for consult Has the patient had a psychiatric consult: Yes.   Survey of unit performed for contraband, proper placement and condition of furniture, tampering with fixtures in bathroom, shower, and each patient room: Yes.  ; Findings:  APPEARANCE/BEHAVIOR calm and cooperative NEURO ASSESSMENT Orientation: time, place and person Hallucinations: Yes.  Auditory Hallucinations Speech: Normal Gait: normal RESPIRATORY ASSESSMENT Normal expansion.  Clear to auscultation.  No rales, rhonchi, or wheezing. CARDIOVASCULAR ASSESSMENT regular rate and rhythm, S1, S2 normal, no murmur, click, rub or gallop GASTROINTESTINAL ASSESSMENT soft, nontender, BS WNL, no r/g EXTREMITIES normal strength, tone, and muscle mass PLAN OF CARE Provide calm/safe environment. Vital signs assessed twice daily. ED BHU Assessment once each 12-hour shift. Collaborate with intake RN daily or as condition indicates. Assure the ED provider has rounded once each shift. Provide and encourage hygiene. Provide redirection as needed. Assess for escalating behavior; address immediately and inform ED provider.  Assess family dynamic and appropriateness for visitation as needed: Yes.  ; If necessary, describe findings:  Educate the patient/family about BHU procedures/visitation: Yes.  ; If necessary, describe findings:

## 2014-11-08 NOTE — ED Notes (Signed)
BEHAVIORAL HEALTH ROUNDING Patient sleeping: no Patient alert and oriented: yes Behavior appropriate: Yes.  ; If no, describe:  Nutrition and fluids offered: Yes  Toileting and hygiene offered: Yes  Sitter present: no Law enforcement present: No

## 2014-11-08 NOTE — ED Notes (Signed)
BEHAVIORAL HEALTH ROUNDING  Patient sleeping: No Patient alert and oriented: Yes Behavior appropriate: Yes. ; If no, describe:  Nutrition and fluids offered: Yes  Toileting and hygiene offered: Yes Sitter present: q 15 minute observations and security camera monitoring  Law enforcement present: yes ODS

## 2014-11-08 NOTE — ED Provider Notes (Signed)
[name] Emergency Department Provider Note  ____________________________________________  Time seen: Approximately 1:30 PM  I have reviewed the triage vital signs and the nursing notes.   HISTORY  Chief Complaint Suicidal  Caveat-history of present illness and review of systems Limited due to the patient's alcohol intoxication/unwillingness to cooperate.  HPI [name] is a 59 y.o. female with history of alcoholism, GERD and anxiety and depression who presents for evaluation of possible suicidal ideations setting of alcohol intoxication. According to the triage note, the patient presented with a friend who was concerned about her. The patient, who had been drinking heavily today, told the friend that she was thinking about killing herself. No other history is obtained. The patient's friend is longer at bedside or available for questioning.   Past Medical History  Diagnosis Date  . GERD (gastroesophageal reflux disease)   . Rosacea   . Elevated liver function tests   . Wears glasses   . Chronic diarrhea   . Alcohol abuse     H/o withdrawal   . PONV (postoperative nausea and vomiting)   . Pancreatitis   . Depression   . Anxiety     Patient Active Problem List   Diagnosis Date Noted  . Substance induced mood disorder 11/06/2014  . Bilateral knee pain 10/14/2014  . Alcohol use disorder, severe, dependence 08/17/2014  . Alcohol abuse   . Suicidal behavior   . Wrist laceration   . Alcohol dependence 10/02/2013  . Alcohol withdrawal 10/02/2013  . Major depression, recurrent 10/02/2013  . Gastric ulcer 01/20/2012  . Fatty liver 10/11/2011  . 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 Elevated liver function tests   . GERD (gastroesophageal reflux disease)   . ESOPHAGEAL MOTILITY DISORDER 10/01/2008  . HIATAL HERNIA 10/01/2008  . FATTY LIVER DISEASE 10/01/2008    Past Surgical History  Procedure Laterality Date  . Cholecystectomy    .  Shoulder surgery  [id number]  . Diagnostic mammogram  2008  . Tubal ligation    . Colonoscopy  Never  . Esophagogastroduodenoscopy  03/21/2011    Procedure: ESOPHAGOGASTRODUODENOSCOPY (EGD);  Surgeon: Scarlette Shorts, MD;  Location: [facility] ENDOSCOPY;  Service: Endoscopy;  Laterality: N/A;  Patient may need to be done at bedside tomorrow depending on status  . Ercp  10/06/2011    Procedure: ENDOSCOPIC RETROGRADE CHOLANGIOPANCREATOGRAPHY (ERCP);  Surgeon: Beryle Beams, MD;  Location: [facility] ENDOSCOPY;  Service: Endoscopy;  Laterality: N/A;  . Laparotomy  01/03/2012    Procedure: EXPLORATORY LAPAROTOMY;  Surgeon: Pedro Earls, MD;  Location: [facility] ORS;  Service: General;  Laterality: N/A;  debridement of necrotic pancreas and placement of drains x2  . Esophagogastroduodenoscopy  01/06/2012    Procedure: ESOPHAGOGASTRODUODENOSCOPY (EGD);  Surgeon: Wonda Horner, MD;  Location: [facility] ENDOSCOPY;  Service: Endoscopy;  Laterality: N/A;  bedside    Current Outpatient Rx  Name  Route  Sig  Dispense  Refill  . FLUoxetine (PROZAC) 40 MG capsule   Oral   Take 1 capsule (40 mg total) by mouth daily. For depression Patient taking differently: Take 40 mg by mouth daily.    30 capsule   0   . gabapentin (NEURONTIN) 400 MG capsule   Oral   Take 1 capsule (400 mg total) by mouth 4 (four) times daily -  before meals and at bedtime. For agitation/substance withdrawal syndrome Patient taking differently: Take 400-1,200 mg by mouth 2 (two) times daily. Pt takes one in the morning and three at bedtime.  120 capsule   0   . naltrexone (DEPADE) 50 MG tablet   Oral   Take 25 mg by mouth at bedtime.         Marland Kitchen QUEtiapine (SEROQUEL) 25 MG tablet   Oral   Take 1 tablet (25 mg total) by mouth 2 (two) times daily. For agitation/sleep Patient taking differently: Take 25 mg by mouth at bedtime. For agitation/sleep   60 tablet   0   . traZODone (DESYREL) 100 MG tablet   Oral   Take 100 mg by mouth at bedtime.         Marland Kitchen  acamprosate (CAMPRAL) 333 MG tablet   Oral   Take 2 tablets (666 mg total) by mouth 3 (three) times daily with meals. For alcoholism Patient not taking: Reported on 11/06/2014   180 tablet   0   . ibuprofen (ADVIL,MOTRIN) 200 MG tablet   Oral   Take 2 tablets (400 mg total) by mouth every 6 (six) hours as needed for moderate pain. Patient not taking: Reported on 11/08/2014   30 tablet   0   . lipase/protease/amylase (CREON) 12000 UNITS CPEP capsule   Oral   Take 1 capsule (12,000 Units total) by mouth 3 (three) times daily with meals. For low pancreatic enzymes Patient not taking: Reported on 11/08/2014   90 capsule   0   . meloxicam (MOBIC) 7.5 MG tablet   Oral   Take 1 tablet (7.5 mg total) by mouth 2 (two) times daily as needed for pain. Patient not taking: Reported on 11/06/2014   30 tablet   1   . nicotine (NICODERM CQ - DOSED IN MG/24 HOURS) 21 mg/24hr patch   Transdermal   Place 1 patch (21 mg total) onto the skin daily. For nicotine addiction Patient not taking: Reported on 10/12/2014   28 patch   0   . omeprazole (PRILOSEC) 40 MG capsule   Oral   Take 1 capsule (40 mg total) by mouth daily. Patient not taking: Reported on 10/12/2014   30 capsule   3   . thiamine 100 MG tablet   Oral   Take 0.5 tablets (50 mg total) by mouth daily. For low thiamine   30 tablet   0   . traMADol (ULTRAM) 50 MG tablet   Oral   Take 1 tablet (50 mg total) by mouth every 12 (twelve) hours as needed. Patient not taking: Reported on 10/12/2014   30 tablet   0   . traZODone (DESYREL) 50 MG tablet   Oral   Take 1 tablet (50 mg total) by mouth at bedtime as needed for sleep (may repeat X 1). For sleep Patient not taking: Reported on 11/08/2014   60 tablet   0     Allergies Ciprofloxacin  Family History  Problem Relation Age of Onset  . Stroke Mother   . Heart disease Father   . Heart failure Father   . Heart disease Sister     Social History History  Substance Use  Topics  . Smoking status: Light Tobacco Smoker -- 0.25 packs/day for 15 years  . Smokeless tobacco: Never Used  . Alcohol Use: 1.5 - 2.0 oz/week    3-4 Standard drinks or equivalent per week     Comment: has been binge drinking since the weekend.    Review of Systems   Caveat-history of present illness and review of systems Limited due to the patient's alcohol intoxication/unwillingness to cooperate. ____________________________________________   PHYSICAL  EXAM:  VITAL SIGNS: ED Triage Vitals  Enc Vitals Group     BP 11/08/14 1205 126/81 mmHg     Pulse Rate 11/08/14 1205 99     Resp 11/08/14 1205 22     Temp 11/08/14 1205 98.8 F (37.1 C)     Temp Source 11/08/14 1205 Oral     SpO2 11/08/14 1205 93 %     Weight 11/08/14 1205 160 lb (72.576 kg)     Height 11/08/14 1205 '5\' 6"'$  (1.676 m)     Head Cir --      Peak Flow --      Pain Score --      Pain Loc --      Pain Edu? --      Excl. in [place]? --     Constitutional: Sleeping but awakens to voice and light touch. Appears intoxicated. She is oriented to self and to place but not to year. She follows commands to move all extremities and then falls asleep quickly. Eyes: Conjunctivae are normal. PERRL. EOMI. Head: Atraumatic. Nose: No congestion/rhinnorhea. Mouth/Throat: Mucous membranes are moist.  Oropharynx non-erythematous. Neck: No stridor. Cardiovascular: Normal rate, regular rhythm. Grossly normal heart sounds.  Good peripheral circulation. Respiratory: Normal respiratory effort.  No retractions. Lungs CTAB. Gastrointestinal: Soft and nontender. No distention. No abdominal bruits. No CVA tenderness. Genitourinary: deferred Musculoskeletal: No lower extremity tenderness nor edema.  No joint effusions. Neurologic:  Normal speech and language. No gross focal neurologic deficits are appreciated. Follows commands to move all extremities which she does equally, does not cooperate with formal neurological testing. Skin:  Skin is  warm, dry and intact. No rash noted. Psychiatric: Mood and affect are normal. Speech and behavior are normal.  ____________________________________________   LABS (all labs ordered are listed, but only abnormal results are displayed)  Labs Reviewed  COMPREHENSIVE METABOLIC PANEL - Abnormal; Notable for the following:    Glucose, Bld 102 (*)    Total Bilirubin 0.1 (*)    All other components within normal limits  ETHANOL - Abnormal; Notable for the following:    Alcohol, Ethyl (B) 270 (*)    All other components within normal limits  ACETAMINOPHEN LEVEL - Abnormal; Notable for the following:    Acetaminophen (Tylenol), Serum <10 (*)    All other components within normal limits  CBC - Abnormal; Notable for the following:    WBC 11.7 (*)    All other components within normal limits  SALICYLATE LEVEL  URINE DRUG SCREEN, QUALITATIVE ([facility] ONLY)   ____________________________________________  EKG none ____________________________________________  RADIOLOGY  none ____________________________________________   PROCEDURES  Procedure(s) performed: None  Critical Care performed: No  ____________________________________________   INITIAL IMPRESSION / ASSESSMENT AND PLAN / ED COURSE  Pertinent labs & imaging results that were available during my care of the patient were reviewed by me and considered in my medical decision making (see chart for details).  [name] is a 58 y.o. female with history of alcoholism, GERD and anxiety and depression who presents for evaluation of possible suicidal ideations setting of alcohol intoxication. On exam, she appears intoxicated does awaken briefly to cooperate briefly with basic neurological examination and questioning. She is unable to tell me whether not she is having any suicidal ideation. Labs reviewed by me and are notable for elevated alcohol level at 270. Mild leukocytosis but left otherwise unremarkable. Care transferred to  Dr. Darl Householder at this time we'll reassess for suicidality when she is sober. Behavioral Health consult place.  ____________________________________________   FINAL CLINICAL IMPRESSION(S) / ED DIAGNOSES  Final diagnoses:  Alcohol intoxication, with unspecified complication      Joanne Gavel, MD 11/08/14 [phone]

## 2014-11-08 NOTE — ED Notes (Signed)
BEHAVIORAL HEALTH ROUNDING Patient sleeping: Yes.   Patient alert and oriented: not applicable Behavior appropriate: Yes.  ; If no, describe:  Nutrition and fluids offered: No Toileting and hygiene offered: No Sitter present: not applicable Law enforcement present: No

## 2014-11-08 NOTE — ED Notes (Signed)
Patient has been feeling suicidal since Tuesday.  Was seen at [facility] and released after one night.  Patient is a binge drinker.  When asked how much she drinks pt states " as much as I can".   Last drink was 20 minutes ago.  Asked pt if she has plan on how she would hurt herself and she states "I have several plans".  Friend reports she held a knife to her chest a couple days ago.  Has visual and auditory hallucinations.  Also reports HI; when asked who she has thoughts of hurting she states "[place]".

## 2014-11-08 NOTE — BH Assessment (Addendum)
Assessment Note  [name] is an 59 y.o. female who presents to the ER via a friend due to voicing SI, making attempts and being intoxicated. According to the patient, she told her friend, she wanted die and didn't want to live. During the interview, patient was guarded and limited with her answers. Even when writer asked direct questions, that required a yes or no answer, the patient would close her eyes and lower her head, as if she was sleep. Writer had to call her name and verbalized he was willing to wait, in order to get the answers. This took place several times throughout the assessment. Specifically when asked about her mood and suicidally.  According to the patient's friend ([phone]), the patient has been binge drinking for approximately a week. Several days ago, he took the patient to the local ER near her home. After 24 hours, the patient was discharged. Friend states, the patient told him, she wanted to die and do not like life anymore. On several different occasions she tried to hang her self, with an extension cord. Attempted to stabbed and cut herself with a knife.  This all took place different days throughout this week. The friend removed all the patient's knives from her home.  Friend is concern for the patient's safety because she lives alone and fears she may be successful in her attempts. Majority of the gestures took place when she is intoxicated. Last week the patient cut herself with knife and the friend believes it could have been worse than what it was, if he hadn't gone by her home to check on her. Her nearest relative is her son and he lives in [place], [place].  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 Per the report of the friend, her drinking and current mood is affecting her performance at work. According to previous notes in the patient's chart, her mother passed 02/2015 and she hasn't grieved her lost.  Patient denies AV/H and HI. When asked  about SI, patient would either stare at writer or close her eyes and lower her head as if she is sleep.  Patient admits to alcohol use, on a daily basis, in large amounts. She was unable to give specifics but she states, "it was a lot."   BAC 270 @ 12:19 UDS pending at the time of assessment  Axis I: Alcohol Abuse, Major Depression, Recurrent severe and Substance Induced Mood Disorder Axis III:  Past Medical History  Diagnosis Date  . GERD (gastroesophageal reflux disease)   . Rosacea   . Elevated liver function tests   . Wears glasses   . Chronic diarrhea   . Alcohol abuse     H/o withdrawal   . PONV (postoperative nausea and vomiting)   . Pancreatitis   . Depression   . Anxiety    Axis IV: occupational problems, other psychosocial or environmental problems, problems related to social environment and problems with primary support group  Past Medical History:  Past Medical History  Diagnosis Date  . GERD (gastroesophageal reflux disease)   . Rosacea   . Elevated liver function tests   . Wears glasses   . Chronic diarrhea   . Alcohol abuse     H/o withdrawal   . PONV (postoperative nausea and vomiting)   . Pancreatitis   . Depression   . Anxiety     Past Surgical History  Procedure Laterality Date  . Cholecystectomy    . Shoulder surgery  [id number]  .  Diagnostic mammogram  2008  . Tubal ligation    . Colonoscopy  Never  . Esophagogastroduodenoscopy  03/21/2011    Procedure: ESOPHAGOGASTRODUODENOSCOPY (EGD);  Surgeon: Scarlette Shorts, MD;  Location: [facility] ENDOSCOPY;  Service: Endoscopy;  Laterality: N/A;  Patient may need to be done at bedside tomorrow depending on status  . Ercp  10/06/2011    Procedure: ENDOSCOPIC RETROGRADE CHOLANGIOPANCREATOGRAPHY (ERCP);  Surgeon: Beryle Beams, MD;  Location: [facility] ENDOSCOPY;  Service: Endoscopy;  Laterality: N/A;  . Laparotomy  01/03/2012    Procedure: EXPLORATORY LAPAROTOMY;  Surgeon: Pedro Earls, MD;  Location: [facility] ORS;  Service:  General;  Laterality: N/A;  debridement of necrotic pancreas and placement of drains x2  . Esophagogastroduodenoscopy  01/06/2012    Procedure: ESOPHAGOGASTRODUODENOSCOPY (EGD);  Surgeon: Wonda Horner, MD;  Location: [facility] ENDOSCOPY;  Service: Endoscopy;  Laterality: N/A;  bedside    Family History:  Family History  Problem Relation Age of Onset  . Stroke Mother   . Heart disease Father   . Heart failure Father   . Heart disease Sister     Social History:  reports that she has been smoking.  She has never used smokeless tobacco. She reports that she drinks about 1.5 - 2.0 oz of alcohol per week. She reports that she does not use illicit drugs.  Additional Social History:  Alcohol / Drug Use Pain Medications: None Reported Prescriptions: None Reported Over the Counter: None Reported History of alcohol / drug use?: Yes Longest period of sobriety (when/how long): None Reported Withdrawal Symptoms: Nausea / Vomiting, Fever / Chills, Sweats, Seizures Onset of Seizures: Unknown Date of most recent seizure: Unknown  CIWA: CIWA-Ar BP: 126/81 mmHg Pulse Rate: 99 Nausea and Vomiting: mild nausea with no vomiting Tactile Disturbances: none Tremor: no tremor Auditory Disturbances: not present Paroxysmal Sweats: no sweat visible Visual Disturbances: not present Anxiety: moderately anxious, or guarded, so anxiety is inferred Headache, Fullness in Head: moderately severe Agitation: somewhat more than normal activity Orientation and Clouding of Sensorium: oriented and can do serial additions CIWA-Ar Total: 10 COWS:    Allergies:  Allergies  Allergen Reactions  . Ciprofloxacin Nausea Only    Home Medications:  (Not in a hospital admission)  OB/GYN Status:  Patient's last menstrual period was 12/08/2006.  General Assessment Data Location of Assessment: [facility] ED TTS Assessment: In system Is this a Tele or Face-to-Face Assessment?: Face-to-Face Is this an Initial Assessment or a  Re-assessment for this encounter?: Initial Assessment Marital status: Long term relationship Maiden name: n/a Is patient pregnant?: No Pregnancy Status: No Living Arrangements: Alone Can pt return to current living arrangement?: Yes Admission Status: Voluntary (Friend brought her to the ER) Is patient capable of signing voluntary admission?: Yes Referral Source: Self/Family/Friend Insurance type: Ship broker Exam ([place]) Medical Exam completed: Yes  Crisis Care Plan Living Arrangements: Alone Name of Psychiatrist: Was going to [facility] but stopped a couple of weeks ago  Name of Therapist: [facility] but stopped attending   Education Status Is patient currently in school?: No Current Grade: n/a Highest grade of school patient has completed: BS in accounting  Name of school: n/a Contact person: n/a  Risk to self with the past 6 months Suicidal Ideation: Yes-Currently Present Has patient been a risk to self within the past 6 months prior to admission? : Yes Suicidal Intent: Yes-Currently Present Has patient had any suicidal intent within the past 6 months prior to admission? :  Yes Is patient at risk for suicide?: Yes Suicidal Plan?: Yes-Currently Present Has patient had any suicidal plan within the past 6 months prior to admission? : Yes Specify Current Suicidal Plan: Per the report of patient's friend, she has had several attempts Access to Means: Yes What has been your use of drugs/alcohol within the last 12 months?: Alcohol Previous Attempts/Gestures: Yes How many times?:  (Multiple attempts) Other Self Harm Risks: Active Addiction, drinks in large amounts Triggers for Past Attempts: Other (Comment) (Alcohol Use) Intentional Self Injurious Behavior: None Family Suicide History: Unknown Recent stressful life event(s): Other (Comment), Loss (Comment) (Per last TTS assessment, mother died March 31, 2015) Persecutory voices/beliefs?:  No Depression: Yes Depression Symptoms: Tearfulness, Isolating, Fatigue, Guilt, Loss of interest in usual pleasures, Feeling worthless/self pity, Feeling angry/irritable Substance abuse history and/or treatment for substance abuse?: Yes Suicide prevention information given to non-admitted patients: Not applicable  Risk to Others within the past 6 months Homicidal Ideation: No Does patient have any lifetime risk of violence toward others beyond the six months prior to admission? : No Thoughts of Harm to Others: No Current Homicidal Intent: No Current Homicidal Plan: No Access to Homicidal Means: No Identified Victim: None Reported History of harm to others?: No Assessment of Violence: None Noted Violent Behavior Description: None Reported Does patient have access to weapons?: No Criminal Charges Pending?: No Does patient have a court date: No Is patient on probation?: No  Psychosis Hallucinations: None noted Delusions: None noted  Mental Status Report Appearance/Hygiene: In scrubs, Unremarkable, In hospital gown Eye Contact: Poor Motor Activity: Unable to assess (Patient was lying in bed, intoxicated) Speech: Logical/coherent Level of Consciousness: Alert Mood: Depressed, Anxious (Patient Intoxicated ) Affect: Depressed, Appropriate to circumstance (Patient Intoxicated ) Anxiety Level: None Thought Processes: Coherent, Relevant Judgement: Impaired Orientation: Person, Place, Situation, Appropriate for developmental age, Time, Other (Comment) (Patient Intoxicated ) Obsessive Compulsive Thoughts/Behaviors: Minimal  Cognitive Functioning Concentration: Decreased Memory: Recent Impaired, Remote Impaired IQ: Average Insight: Poor Impulse Control: Poor Appetite: Fair Weight Loss: 0 Weight Gain: 0 Sleep: Increased Total Hours of Sleep:  (Unknown) Vegetative Symptoms: None  ADLScreening [facility] Assessment Services) Patient's cognitive ability adequate to safely complete  daily activities?: Yes Patient able to express need for assistance with ADLs?: Yes Independently performs ADLs?: Yes (appropriate for developmental age)  Prior Inpatient Therapy Prior Inpatient Therapy: Yes Prior Therapy Dates: multiple times Prior Therapy Facilty/Provider(s): [facility], [place] Reason for Treatment: Alcohol use  Prior Outpatient Therapy Prior Outpatient Therapy: Yes Prior Therapy Dates: 10/2014 Prior Therapy Facilty/Provider(s): [facility] Reason for Treatment: SAIOP Does patient have an ACCT team?: No Does patient have Intensive In-House Services?  : No Does patient have [facility] services? : No Does patient have P4CC services?: No  ADL Screening (condition at time of admission) Patient's cognitive ability adequate to safely complete daily activities?: Yes Patient able to express need for assistance with ADLs?: Yes Independently performs ADLs?: Yes (appropriate for developmental age)       Abuse/Neglect Assessment (Assessment to be complete while patient is alone) Physical Abuse: Denies Verbal Abuse: Denies Sexual Abuse: Denies Exploitation of patient/patient's resources: Denies Self-Neglect: Denies Values / Beliefs Cultural Requests During Hospitalization: None Spiritual Requests During Hospitalization: None Consults Spiritual Care Consult Needed: No Social Work Consult Needed: No      Additional Information 1:1 In Past 12 Months?: No CIRT Risk: No Elopement Risk: No Does patient have medical clearance?: Yes  Child/Adolescent Assessment Running Away Risk: Denies (Patient is an adult)  Disposition:  Disposition Initial Assessment Completed for this Encounter: Yes Disposition of Patient: Other dispositions (To be seen by Psych MD) Other disposition(s): Other (Comment) (To be seen by Psych MD)  On Site Evaluation by:   Reviewed with Physician:    Gunnar Fusi, MS, LCAS, [place], [place], CCSI 11/08/2014 5:41 PM

## 2014-11-08 NOTE — ED Provider Notes (Signed)
  Physical Exam  BP 126/81 mmHg  Pulse 99  Temp(Src) 98.8 F (37.1 C) (Oral)  Resp 22  Ht '5\' 6"'$  (1.676 m)  Wt 160 lb (72.576 kg)  BMI 25.84 kg/m2  SpO2 93%  LMP 12/08/2006  Physical Exam  ED Course  Procedures  MDM Care assumed at sign out. Patient was suicidal but also intoxicated. Felt better now. Needed ativan for tremors but not tachycardic. Will place on CIWA. TTS saw patient and felt that patient made suicidal threats to boyfriend. Will have psych see her.   Wandra Arthurs, MD 11/08/14 [phone]

## 2014-11-08 NOTE — ED Notes (Signed)
Pt standing outside of room requesting wine to drink, states "I am going to have a seizure if I can't have any wine", MD aware and orders given

## 2014-11-08 NOTE — ED Notes (Signed)

## 2014-11-08 NOTE — ED Notes (Signed)
ENVIRONMENTAL ASSESSMENT Potentially harmful objects out of patient reach: Yes.   Personal belongings secured: Yes.   Patient dressed in hospital provided attire only: Yes.   Plastic bags out of patient reach: No. linen bag and trash bag in room Patient care equipment (cords, cables, call bells, lines, and drains) shortened, removed, or accounted for: Yes.  , call light in room wrapped around itself Equipment and supplies removed from bottom of stretcher: Yes.   Potentially toxic materials out of patient reach: Yes.   Sharps container removed or out of patient reach: No. sharp container in room on the wall

## 2014-11-09 DIAGNOSIS — F1024 Alcohol dependence with alcohol-induced mood disorder: Secondary | ICD-10-CM

## 2014-11-09 DIAGNOSIS — F332 Major depressive disorder, recurrent severe without psychotic features: Secondary | ICD-10-CM

## 2014-11-09 MED ORDER — QUETIAPINE FUMARATE 25 MG PO TABS
25.0000 mg | ORAL_TABLET | Freq: Every evening | ORAL | Status: DC | PRN
  Administered 2014-11-09: 25 mg via ORAL
  Filled 2014-11-09 (×2): qty 1

## 2014-11-09 MED ORDER — GABAPENTIN 100 MG PO CAPS
400.0000 mg | ORAL_CAPSULE | Freq: Every day | ORAL | Status: DC
  Administered 2014-11-09: 400 mg via ORAL
  Filled 2014-11-09: qty 1

## 2014-11-09 MED ORDER — NICOTINE 10 MG IN INHA
1.0000 | RESPIRATORY_TRACT | Status: DC | PRN
  Administered 2014-11-09: 1 via RESPIRATORY_TRACT

## 2014-11-09 MED ORDER — QUETIAPINE FUMARATE 25 MG PO TABS
25.0000 mg | ORAL_TABLET | Freq: Once | ORAL | Status: AC
  Administered 2014-11-09: 25 mg via ORAL

## 2014-11-09 MED ORDER — FLUOXETINE HCL 20 MG PO CAPS
40.0000 mg | ORAL_CAPSULE | Freq: Every day | ORAL | Status: DC
Start: 2014-11-09 — End: 2014-11-10
  Administered 2014-11-09 – 2014-11-10 (×2): 40 mg via ORAL
  Filled 2014-11-09 (×2): qty 2

## 2014-11-09 MED ORDER — LORAZEPAM 1 MG PO TABS
ORAL_TABLET | ORAL | Status: AC
  Administered 2014-11-09: 1 mg via ORAL
  Filled 2014-11-09: qty 1

## 2014-11-09 MED ORDER — ACETAMINOPHEN 500 MG PO TABS
ORAL_TABLET | ORAL | Status: AC
  Filled 2014-11-09: qty 2

## 2014-11-09 MED ORDER — PANCRELIPASE (LIP-PROT-AMYL) 12000-38000 UNITS PO CPEP
12000.0000 [IU] | ORAL_CAPSULE | Freq: Three times a day (TID) | ORAL | Status: DC | PRN
  Filled 2014-11-09 (×3): qty 1

## 2014-11-09 MED ORDER — NICOTINE 21 MG/24HR TD PT24
21.0000 mg | MEDICATED_PATCH | Freq: Every day | TRANSDERMAL | Status: DC
  Administered 2014-11-09: 21 mg via TRANSDERMAL
  Filled 2014-11-09: qty 1

## 2014-11-09 MED ORDER — NICOTINE 10 MG IN INHA
RESPIRATORY_TRACT | Status: AC
  Administered 2014-11-09: 1 via RESPIRATORY_TRACT
  Filled 2014-11-09: qty 36

## 2014-11-09 MED ORDER — ACETAMINOPHEN 500 MG PO TABS
1000.0000 mg | ORAL_TABLET | Freq: Once | ORAL | Status: AC
  Administered 2014-11-09: 1000 mg via ORAL

## 2014-11-09 NOTE — ED Notes (Signed)
BEHAVIORAL HEALTH ROUNDING Patient sleeping: Yes.   Patient alert and oriented: not applicable Behavior appropriate: Yes.  ; If no, describe:  Nutrition and fluids offered: No Toileting and hygiene offered: No Sitter present: no Law enforcement present: Yes

## 2014-11-09 NOTE — ED Notes (Signed)
ED BHU [place] Is the patient under IVC or is there intent for IVC: Yes.   Is the patient medically cleared: Yes.   Is there vacancy in the ED BHU: Yes.   Is the population mix appropriate for patient: Yes.   Is the patient awaiting placement in inpatient or outpatient setting: Yes.   Has the patient had a psychiatric consult: Yes.   Survey of unit performed for contraband, proper placement and condition of furniture, tampering with fixtures in bathroom, shower, and each patient room: Yes.  ; Findings:  APPEARANCE/BEHAVIOR calm and cooperative NEURO ASSESSMENT Orientation: time, place and person Hallucinations: No.None noted (Hallucinations) Speech: Normal Gait: normal RESPIRATORY ASSESSMENT Normal expansion.  Clear to auscultation.  No rales, rhonchi, or wheezing. CARDIOVASCULAR ASSESSMENT regular rate and rhythm, S1, S2 normal, no murmur, click, rub or gallop GASTROINTESTINAL ASSESSMENT soft, nontender, BS WNL, no r/g EXTREMITIES normal strength, tone, and muscle mass PLAN OF CARE Provide calm/safe environment. Vital signs assessed twice daily. ED BHU Assessment once each 12-hour shift. Collaborate with intake RN daily or as condition indicates. Assure the ED provider has rounded once each shift. Provide and encourage hygiene. Provide redirection as needed. Assess for escalating behavior; address immediately and inform ED provider.  Assess family dynamic and appropriateness for visitation as needed: Yes.  If necessary, describe findings:  Educate the patient/family about BHU procedures/visitation: Yes.  ; If necessary, describe findings:

## 2014-11-09 NOTE — ED Notes (Signed)
BEHAVIORAL HEALTH ROUNDING Patient sleeping: Yes.   Patient alert and oriented: yes Behavior appropriate: Yes.  ; If no, describe:  Nutrition and fluids offered: Yes  Toileting and hygiene offered: Yes  Sitter present: not applicable Law enforcement present: Yes  

## 2014-11-09 NOTE — ED Notes (Signed)
BEHAVIORAL HEALTH ROUNDING Patient sleeping: No. Patient alert and oriented: yes Behavior appropriate: Yes.  ; If no, describe:  Nutrition and fluids offered: Yes  Toileting and hygiene offered: Yes  Sitter present: not applicable Law enforcement present: Yes  

## 2014-11-09 NOTE — ED Notes (Signed)
BEHAVIORAL HEALTH ROUNDING Patient sleeping: No. Patient alert and oriented: yes Behavior appropriate: Yes.  ; If no, describe:  Nutrition and fluids offered: No Toileting and hygiene offered: No Sitter present: no Law enforcement present: Yes

## 2014-11-09 NOTE — ED Provider Notes (Signed)
-----------------------------------------   7:52 AM on 11/09/2014 -----------------------------------------   Blood pressure 141/64, pulse 99, temperature 98 F (36.7 C), temperature source Oral, resp. rate 19, height '5\' 6"'$  (1.676 m), weight 160 lb (72.576 kg), last menstrual period 12/08/2006, SpO2 94 %.  The patient had no acute events since last update.  Calm and cooperative at this time.  Disposition is pending per Psychiatry/Behavioral Medicine team recommendations.     Paulette Blanch, MD 11/09/14 [id number]

## 2014-11-09 NOTE — ED Notes (Signed)
BEHAVIORAL HEALTH ROUNDING Patient sleeping: Yes.   Patient alert and oriented: eyes closed  Appears asleep Behavior appropriate: Yes.  ; If no, describe:  Nutrition and fluids offered: Yes  Toileting and hygiene offered: sleeping Sitter present: q 15 minute observations and security camera monitoring Law enforcement present: yes  [facility] 

## 2014-11-09 NOTE — ED Notes (Signed)

## 2014-11-09 NOTE — ED Notes (Signed)
BEHAVIORAL HEALTH ROUNDING Patient sleeping: Yes.   Patient alert and oriented: not applicable Behavior appropriate: Yes.  ; If no, describe:  Nutrition and fluids offered: no  Toileting and hygiene offered: No Sitter present: no Law enforcement present: Yes

## 2014-11-09 NOTE — ED Notes (Signed)
BEHAVIORAL HEALTH ROUNDING Patient sleeping: No. Patient alert and oriented: yes Behavior appropriate: Yes.  ; If no, describe:  Nutrition and fluids offered: Yes  Toileting and hygiene offered: No Sitter present: no Law enforcement present: Yes

## 2014-11-09 NOTE — ED Notes (Signed)
BEHAVIORAL HEALTH ROUNDING  Patient sleeping: No Patient alert and oriented: Yes Behavior appropriate: Yes. ; If no, describe:  Nutrition and fluids offered: Yes  Toileting and hygiene offered: Yes Sitter present: q 15 minute observations and security camera monitoring  Law enforcement present: yes ODS

## 2014-11-09 NOTE — Consult Note (Signed)
[facility] Consult   Reason for Consult:  This is a consult for this 59 year old woman with a history of alcohol abuse with complications of pancreatitis, weight loss, poor self-care. She also suffers from chronic depression part of which may be a direct effect of her substance abuse but some of which may BE an endogenous major depression. History of multiple suicide attempts Referring Physician:  Archie Balboa Patient Identification: [name] MRN:  [id number] Principal Diagnosis: Major depression, recurrent Diagnosis:   Patient Active Problem List   Diagnosis Date Noted  . Substance induced mood disorder [F19.94] 11/06/2014  . Bilateral knee pain [M25.561, M25.562] 10/14/2014  . Alcohol use disorder, severe, dependence [F10.20] 08/17/2014  . Alcohol abuse [F10.10]   . Suicidal behavior [F48.9]   . Wrist laceration [S61.519A]   . Alcohol dependence [F10.20] 10/02/2013  . Alcohol withdrawal [F10.239] 10/02/2013  . Major depression, recurrent [F33.9] 10/02/2013  . Gastric ulcer [K25.9] 01/20/2012  . Fatty liver [K76.0] 10/11/2011  . 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 Elevated liver function tests [R79.89]   . GERD (gastroesophageal reflux disease) [K21.9]   . 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 HIATAL HERNIA [K44.9] 10/01/2008  . FATTY LIVER DISEASE [K76.89] 10/01/2008    Total Time spent with patient: 1 hour  Subjective:   [name] is a 59 y.o. female patient admitted with "this is not what I want". Patient is minimizing symptoms but admits to alcohol abuse and depression.  HPI:  Information from the patient and the chart. Patient presented yesterday voluntarily to the emergency room. Had a blood alcohol level of 270. Her friend who brought her in reported that she had been making suicidal statements recently including talk about hanging herself or cutting herself. The patient denies this. Admits that her mood stays sad much of the  time. Admits that she is sleeping poorly and eating poorly. She says that she drinks about 4 or 5 times a week about a bottle of wine at a time. Minimizes the degree to which this is escalating. Has poor insight about the degree of problem that has been for her although it's obvious that she recently lost her job as a direct result of her drinking. Patient denies any auditory or visual hallucinations. States that she stopped taking her Prozac about a week ago for no really good reason. Denies that she is abusing any other drugs. Stresses include job loss and chronic isolation, difficulty stopping alcohol abuse on her own.  Long history of alcohol abuse going back many years. She claims that she's been able to maintain sobriety for years at a time but she is also had multiple hospitalizations and detoxes. Distant history of a seizure. Patient has a history of multiple self injuries including serious self laceration in the past which might result alt in suicidal behavior. She has been treated by Dr. Jordan Hawks in [place] with Prozac for the last few years which was at least partially helpful. Unclear history of any psychotic symptoms. No history of mania.  Social history: Patient lives by herself. She has a boyfriend but currently she is irritated at him because of his insistence that she do something about her drinking problem. 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 Hasn't adult son who lives in [place]. She admits that her family would prefer that she stop drinking.  Medical history: History of pancreatitis related to alcohol abuse. History of elevated liver enzymes and blood cell abnormalities related to alcohol  abuse. Currently complaining of tingling and numbness in her lower extremities.  Substance abuse history: As noted has a long history of alcohol abuse. Has had a distant history of seizures. Does not think she's ever had delirium tremens. Claims that she's been able to  stay sober recently up to 60 days of the time although the time course she describes is not really consistent with other things in her history. She has been involved in AA in the past but seems to have little motivation to get back into specific treatment. Denies that she abuses any other drugs.  Family history: Father drank heavily  Current medication: She discontinued all of her appropriate medicines but was on Prozac 40 mg a day Seroquel 25 mg at night and Neurontin 400 mg at night and Creon when necessary for her pancreatitis HPI Elements:   Quality:  Depressed mood. Low energy. Passive suicidal thoughts. Heavy drinking. Severity:  Severe with multiple ways that this could be life-threatening. Timing:  Seems to been worse over the last couple weeks. She was over at [facility] just 2 days ago with the same problem and then presented here within a day. Duration:  Chronic ongoing issue. Context:  Recent job loss. Chronic alcohol abuse. Also mentions that she is still mourning the death of her mother from last year..  Past Medical History:  Past Medical History  Diagnosis Date  . GERD (gastroesophageal reflux disease)   . Rosacea   . Elevated liver function tests   . Wears glasses   . Chronic diarrhea   . Alcohol abuse     H/o withdrawal   . PONV (postoperative nausea and vomiting)   . Pancreatitis   . Depression   . Anxiety     Past Surgical History  Procedure Laterality Date  . Cholecystectomy    . Shoulder surgery  [id number]  . Diagnostic mammogram  2008  . Tubal ligation    . Colonoscopy  Never  . Esophagogastroduodenoscopy  03/21/2011    Procedure: ESOPHAGOGASTRODUODENOSCOPY (EGD);  Surgeon: Scarlette Shorts, MD;  Location: [facility] ENDOSCOPY;  Service: Endoscopy;  Laterality: N/A;  Patient may need to be done at bedside tomorrow depending on status  . Ercp  10/06/2011    Procedure: ENDOSCOPIC RETROGRADE CHOLANGIOPANCREATOGRAPHY (ERCP);  Surgeon: Beryle Beams, MD;  Location: [facility];   Service: Endoscopy;  Laterality: N/A;  . Laparotomy  01/03/2012    Procedure: EXPLORATORY LAPAROTOMY;  Surgeon: Pedro Earls, MD;  Location: [facility] ORS;  Service: General;  Laterality: N/A;  debridement of necrotic pancreas and placement of drains x2  . Esophagogastroduodenoscopy  01/06/2012    Procedure: ESOPHAGOGASTRODUODENOSCOPY (EGD);  Surgeon: Wonda Horner, MD;  Location: [facility] ENDOS[facility];  Service: Endoscopy;  Laterality: N/A;  bedside   Family History:  Family History  Problem Relation Age of Onset  . Stroke Mother   . Heart disease Father   . Heart failure Father   . Heart disease Sister    Social History:  History  Alcohol Use  . 1.5 - 2.0 oz/week  . 3-4 Standard drinks or equivalent per week    Comment: has been binge drinking since the weekend.     History  Drug Use No    Comment: THC tiny bowls on week-ends, last use 08/15/14    History   Social History  . Marital Status: Divorced    Spouse Name: N/A  . Number of Children: N/A  . Years of Education: N/A   Social History Main Topics  .  Smoking status: Light Tobacco Smoker -- 0.25 packs/day for 15 years  . Smokeless tobacco: Never Used  . Alcohol Use: 1.5 - 2.0 oz/week    3-4 Standard drinks or equivalent per week     Comment: has been binge drinking since the weekend.  . Drug Use: No     Comment: THC tiny bowls on week-ends, last use 08/15/14  . 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   Other Topics Concern  . None   Social History Narrative   Has a son and some friends with whom she is close. Was in rehab in 09/2010 and was sober for a few months after.    Additional Social History:    Pain Medications: None Reported Prescriptions: None Reported Over the Counter: None Reported History of alcohol / drug use?: Yes Longest period of sobriety (when/how long): None Reported Withdrawal Symptoms: Nausea / Vomiting, Fever / Chills, Sweats, Seizures Onset of Seizures:  Unknown Date of most recent seizure: Unknown                     Allergies:   Allergies  Allergen Reactions  . Ciprofloxacin Nausea Only    Labs:  Results for orders placed or performed during the hospital encounter of 11/08/14 (from the past 48 hour(s))  Comprehensive metabolic panel     Status: Abnormal   Collection Time: 11/08/14 12:19 PM  Result Value Ref Range   Sodium 142 135 - 145 mmol/L   Potassium 4.1 3.5 - 5.1 mmol/L   Chloride 105 101 - 111 mmol/L   CO2 24 22 - 32 mmol/L   Glucose, Bld 102 (H) 65 - 99 mg/dL   BUN 15 6 - 20 mg/dL   Creatinine, Ser 0.60 0.44 - 1.00 mg/dL   Calcium 8.9 8.9 - 10.3 mg/dL   Total Protein 7.9 6.5 - 8.1 g/dL   Albumin 4.2 3.5 - 5.0 g/dL   AST 22 15 - 41 U/L   ALT 17 14 - 54 U/L   Alkaline Phosphatase 67 38 - 126 U/L   Total Bilirubin 0.1 (L) 0.3 - 1.2 mg/dL   GFR calc non Af Amer >60 >60 mL/min   GFR calc Af Amer >60 >60 mL/min    Comment: (NOTE) The eGFR has been calculated using the CKD EPI equation. This calculation has not been validated in all clinical situations. eGFR's persistently <60 mL/min signify possible Chronic Kidney Disease.    Anion gap 13 5 - 15  Ethanol (ETOH)     Status: Abnormal   Collection Time: 11/08/14 12:19 PM  Result Value Ref Range   Alcohol, Ethyl (B) 270 (H) <5 mg/dL    Comment:        LOWEST DETECTABLE LIMIT FOR SERUM ALCOHOL IS 5 mg/dL FOR MEDICAL PURPOSES ONLY   Salicylate level     Status: None   Collection Time: 11/08/14 12:19 PM  Result Value Ref Range   Salicylate Lvl <3.0 2.8 - 30.0 mg/dL  Acetaminophen level     Status: Abnormal   Collection Time: 11/08/14 12:19 PM  Result Value Ref Range   Acetaminophen (Tylenol), Serum <10 (L) 10 - 30 ug/mL    Comment:        THERAPEUTIC CONCENTRATIONS VARY SIGNIFICANTLY. A RANGE OF 10-30 ug/mL MAY BE AN EFFECTIVE CONCENTRATION FOR MANY PATIENTS. HOWEVER, SOME ARE BEST TREATED AT CONCENTRATIONS OUTSIDE THIS RANGE. ACETAMINOPHEN  CONCENTRATIONS >150 ug/mL AT 4 HOURS  AFTER INGESTION AND >50 ug/mL AT 12 HOURS AFTER INGESTION ARE OFTEN ASSOCIATED WITH TOXIC REACTIONS.   CBC     Status: Abnormal   Collection Time: 11/08/14 12:19 PM  Result Value Ref Range   WBC 11.7 (H) 3.6 - 11.0 K/uL   RBC 4.77 3.80 - 5.20 MIL/uL   Hemoglobin 14.8 12.0 - 16.0 g/dL   HCT 44.1 35.0 - 47.0 %   MCV 92.4 80.0 - 100.0 fL   MCH 31.0 26.0 - 34.0 pg   MCHC 33.6 32.0 - 36.0 g/dL   RDW 12.9 11.5 - 14.5 %   Platelets 283 150 - 440 K/uL  Urine Drug Screen, Qualitative ([facility] only)     Status: Abnormal   Collection Time: 11/08/14  8:46 PM  Result Value Ref Range   Tricyclic, Ur Screen NONE DETECTED NONE DETECTED   Amphetamines, Ur Screen NONE DETECTED NONE DETECTED   MDMA (Ecstasy)Ur Screen NONE DETECTED NONE DETECTED   Cocaine Metabolite,Ur [place] NONE DETECTED NONE DETECTED   Opiate, Ur Screen NONE DETECTED NONE DETECTED   Phencyclidine (PCP) Ur S NONE DETECTED NONE DETECTED   Cannabinoid 50 Ng, Ur [place] POSITIVE (A) NONE DETECTED   Barbiturates, Ur Screen NONE DETECTED NONE DETECTED   Benzodiazepine, Ur Scrn POSITIVE (A) NONE DETECTED   Methadone Scn, Ur NONE DETECTED NONE DETECTED    Comment: (NOTE) 161  Tricyclics, urine               Cutoff 1000 ng/mL 200  Amphetamines, urine             Cutoff 1000 ng/mL 300  MDMA (Ecstasy), urine           Cutoff 500 ng/mL 400  Cocaine Metabolite, urine       Cutoff 300 ng/mL 500  Opiate, urine                   Cutoff 300 ng/mL 600  Phencyclidine (PCP), urine      Cutoff 25 ng/mL 700  Cannabinoid, urine              Cutoff 50 ng/mL 800  Barbiturates, urine             Cutoff 200 ng/mL 900  Benzodiazepine, urine           Cutoff 200 ng/mL 1000 Methadone, urine                Cutoff 300 ng/mL 1100 1200 The urine drug screen provides only a preliminary, unconfirmed 1300 analytical test result and should not be used for non-medical 1400 purposes. Clinical consideration and professional judgment  should 1500 be applied to any positive drug screen result due to possible 1600 interfering substances. A more specific alternate chemical method 1700 must be used in order to obtain a confirmed analytical result.  1800 Gas chromato graphy / mass spectrometry (GC/MS) is the preferred 1900 confirmatory method.     Vitals: Blood pressure 150/70, pulse 96, temperature 98.4 F (36.9 C), temperature source Oral, resp. rate 20, height 5' 6" (1.676 m), weight 72.576 kg (160 lb), last menstrual period 12/08/2006, SpO2 99 %.  Risk to Self: Suicidal Ideation: Yes-Currently Present Suicidal Intent: Yes-Currently Present Is patient at risk for suicide?: Yes Suicidal Plan?: Yes-Currently Present Specify Current Suicidal Plan: Per the report of patient's friend, she has had several attempts Access to Means: Yes What has been your use of drugs/alcohol within the last 12 months?: Alcohol How many times?:  (Multiple attempts) Other  Self Harm Risks: Active Addiction, drinks in large amounts Triggers for Past Attempts: Other (Comment) (Alcohol Use) Intentional Self Injurious Behavior: None Risk to Others: Homicidal Ideation: No Thoughts of Harm to Others: No Current Homicidal Intent: No Current Homicidal Plan: No Access to Homicidal Means: No Identified Victim: None Reported History of harm to others?: No Assessment of Violence: None Noted Violent Behavior Description: None Reported Does patient have access to weapons?: No Criminal Charges Pending?: No Does patient have a court date: No Prior Inpatient Therapy: Prior Inpatient Therapy: Yes Prior Therapy Dates: multiple times Prior Therapy Facilty/Provider(s): [place], [place] Reason for Treatment: Alcohol use Prior Outpatient Therapy: Prior Outpatient Therapy: Yes Prior Therapy Dates: 10/2014 Prior Therapy Facilty/Provider(s): [facility] Reason for Treatment: SAIOP Does patient have an ACCT team?: No Does patient have Intensive In-House  Services?  : No Does patient have [facility] services? : No Does patient have P4CC services?: No  Current Facility-Administered Medications  Medication Dose Route Frequency Provider Last Rate Last Dose  . LORazepam (ATIVAN) injection 0-4 mg  0-4 mg Intravenous 4 times per day Wandra Arthurs, MD   0 mg at 11/08/14 2029  . LORazepam (ATIVAN) injection 0-4 mg  0-4 mg Intravenous Q12H Wandra Arthurs, MD   0 mg at 11/08/14 2030  . LORazepam (ATIVAN) tablet 0-4 mg  0-4 mg Oral 4 times per day Wandra Arthurs, MD   2 mg at 11/09/14 1254  . LORazepam (ATIVAN) tablet 0-4 mg  0-4 mg Oral Q12H Wandra Arthurs, MD   1 mg at 11/09/14 [phone]  . nicotine (NICOTROL) 10 MG inhaler 1 continuous puffing  1 continuous puffing Inhalation PRN Paulette Blanch, MD   1 continuous puffing at 11/09/14 0610  . thiamine (B-1) injection 100 mg  100 mg Intravenous Daily Wandra Arthurs, MD   100 mg at 11/08/14 2216  . thiamine (VITAMIN B-1) tablet 100 mg  100 mg Oral Daily Wandra Arthurs, MD   100 mg at 11/09/14 1937   Current Outpatient Prescriptions  Medication Sig Dispense Refill  . FLUoxetine (PROZAC) 40 MG capsule Take 1 capsule (40 mg total) by mouth daily. For depression (Patient taking differently: Take 40 mg by mouth daily. ) 30 capsule 0  . gabapentin (NEURONTIN) 400 MG capsule Take 1 capsule (400 mg total) by mouth 4 (four) times daily -  before meals and at bedtime. For agitation/substance withdrawal syndrome (Patient taking differently: Take 400-1,200 mg by mouth 2 (two) times daily. Pt takes one in the morning and three at bedtime.) 120 capsule 0  . naltrexone (DEPADE) 50 MG tablet Take 25 mg by mouth at bedtime.    Marland Kitchen QUEtiapine (SEROQUEL) 25 MG tablet Take 1 tablet (25 mg total) by mouth 2 (two) times daily. For agitation/sleep (Patient taking differently: Take 25 mg by mouth at bedtime. For agitation/sleep) 60 tablet 0  . traZODone (DESYREL) 100 MG tablet Take 100 mg by mouth at bedtime.    Marland Kitchen acamprosate (CAMPRAL) 333 MG tablet Take 2  tablets (666 mg total) by mouth 3 (three) times daily with meals. For alcoholism (Patient not taking: Reported on 11/06/2014) 180 tablet 0  . ibuprofen (ADVIL,MOTRIN) 200 MG tablet Take 2 tablets (400 mg total) by mouth every 6 (six) hours as needed for moderate pain. (Patient not taking: Reported on 11/08/2014) 30 tablet 0  . lipase/protease/amylase (CREON) 12000 UNITS CPEP capsule Take 1 capsule (12,000 Units total) by mouth 3 (three) times daily with meals. For low pancreatic  enzymes (Patient not taking: Reported on 11/08/2014) 90 capsule 0  . meloxicam (MOBIC) 7.5 MG tablet Take 1 tablet (7.5 mg total) by mouth 2 (two) times daily as needed for pain. (Patient not taking: Reported on 11/06/2014) 30 tablet 1  . nicotine (NICODERM CQ - DOSED IN MG/24 HOURS) 21 mg/24hr patch Place 1 patch (21 mg total) onto the skin daily. For nicotine addiction (Patient not taking: Reported on 10/12/2014) 28 patch 0  . omeprazole (PRILOSEC) 40 MG capsule Take 1 capsule (40 mg total) by mouth daily. (Patient not taking: Reported on 10/12/2014) 30 capsule 3  . thiamine 100 MG tablet Take 0.5 tablets (50 mg total) by mouth daily. For low thiamine (Patient not taking: Reported on 11/08/2014) 30 tablet 0  . traMADol (ULTRAM) 50 MG tablet Take 1 tablet (50 mg total) by mouth every 12 (twelve) hours as needed. (Patient not taking: Reported on 10/12/2014) 30 tablet 0  . traZODone (DESYREL) 50 MG tablet Take 1 tablet (50 mg total) by mouth at bedtime as needed for sleep (may repeat X 1). For sleep (Patient not taking: Reported on 11/08/2014) 60 tablet 0  . [DISCONTINUED] ipratropium (ATROVENT) 0.06 % nasal spray Place 2 sprays into both nostrils 4 (four) times daily. 15 mL 0    Musculoskeletal: Strength & Muscle Tone: decreased and atrophy Gait & Station: broad based Patient leans: N/A  Psychiatric Specialty Exam: Physical Exam  Constitutional: She appears well-developed. She appears listless. She has a sickly appearance.  HENT:   Head: Normocephalic and atraumatic.  Eyes: Conjunctivae are normal. Pupils are equal, round, and reactive to light.  Neck: Normal range of motion.  Cardiovascular: Normal heart sounds.   Respiratory: Effort normal.  GI: Soft.  Musculoskeletal: Normal range of motion.  Neurological: She appears listless.  Skin: Skin is warm and dry.  Psychiatric: Her affect is blunt. Her speech is delayed. She is slowed and withdrawn. Cognition and memory are impaired. She expresses impulsivity and inappropriate judgment. She exhibits a depressed mood. She expresses suicidal ideation. She exhibits abnormal recent memory and abnormal remote memory.  Patient presents as a disheveled woman who looks her stated age who is cooperative with the exam but appears to be somewhat withholding and minimizing history. Eye contact intermittent. Psychomotor activity sluggish. Speech decreased in total amount and quiet. Patient's small short and long-term memory are impaired. Affect is dysphoric. Tearful at times. Denies suicidal ideation. Minimizes substance abuse. Insight poor. Judgment poor. No evidence of hallucinations or psychosis or delirium.    Review of Systems  Constitutional: Positive for weight loss.  HENT: Negative.   Eyes: Negative.   Respiratory: Positive for cough.   Cardiovascular: Negative.   Gastrointestinal: Negative.   Musculoskeletal: Negative.   Skin: Negative.   Neurological: Positive for tingling and weakness.  Psychiatric/Behavioral: Positive for depression, memory loss and substance abuse. Negative for suicidal ideas and hallucinations. The patient has insomnia. The patient is not nervous/anxious.     Blood pressure 150/70, pulse 96, temperature 98.4 F (36.9 C), temperature source Oral, resp. rate 20, height 5' 6" (1.676 m), weight 72.576 kg (160 lb), last menstrual period 12/08/2006, SpO2 99 %.Body mass index is 25.84 kg/(m^2).  General Appearance: Disheveled  Eye Sport and exercise psychologist::  Fair  Speech:   Slow  Volume:  Decreased  Mood:  Euthymic and Patient says that her mood is fine even wall she is crying and clearly in distress. Or insight.  Affect:  Depressed and Tearful  Thought Process:  Tangential  Orientation:  Full (Time, Place, and Person)  Thought Content:  Negative  Suicidal Thoughts:  Yes.  without intent/plan  Homicidal Thoughts:  No  Memory:  Immediate;   Good Recent;   Poor Remote;   Fair  Judgement:  Impaired  Insight:  Lacking  Psychomotor Activity:  Decreased  Concentration:  Poor  Recall:  [place] of Knowledge:Good  Language: Good  Akathisia:  No  Handed:  Right  AIMS (if indicated):     Assets:  Housing Social Support  ADL's:  Intact  Cognition: Impaired,  Mild  Sleep:      Medical Decision Making: Review or order clinical lab tests (1), Discuss test with performing physician (1), Established Problem, Worsening (2), Review of Medication Regimen & Side Effects (2) and Review of New Medication or Change in Dosage (2)  Treatment Plan Summary: Medication management and Plan Patient came in voluntarily but showing poor insight and clearly is in the midst of multiple episodes of dangerous behavior as evidenced by her presentation at 2 different emergency rooms within just a couple days with the same problems. Her history is not all that believable and I tend to give a lot of weight to the claims of her boyfriend that she has been threatening suicide while intoxicated. Based on past history and current behavior at think she is at high risk for self injury or even suicide and is certainly at very high risk for continued injury from alcohol abuse. Commitment papers have been taken out of the patient will be admitted to the inpatient psychiatry ward with a diagnosis of major depression and alcohol abuse. Detox wrote a call in place. Restart Prozac and Neurontin and when necessary trazodone. Restart Creon when necessary and nicotine patch. Inpatient treatment team can work  on further treatment as she stabilizes. Patient informed of the plan.  Plan:  Recommend psychiatric Inpatient admission when medically cleared. Supportive therapy provided about ongoing stressors. Disposition: See the note above. John Clapacs 11/09/2014 1:00 PM

## 2014-11-09 NOTE — ED Notes (Signed)
BEHAVIORAL HEALTH ROUNDING Patient sleeping: Yes.   Patient alert and oriented: yes Behavior appropriate: Yes.  ; If no, describe:  Nutrition and fluids offered: No Toileting and hygiene offered: No Sitter present: no Law enforcement present: Yes  

## 2014-11-09 NOTE — ED Notes (Signed)
ED BHU [place] Is the patient under IVC or is there intent for IVC: no Is the patient medically cleared: Yes.   Is there vacancy in the ED BHU: Yes.   Is the population mix appropriate for patient: Yes.   Is the patient awaiting placement in inpatient or outpatient setting: Yes.   Has the patient had a psychiatric consult: Yes.   Survey of unit performed for contraband, proper placement and condition of furniture, tampering with fixtures in bathroom, shower, and each patient room: Yes.  ; Findings:  APPEARANCE/BEHAVIOR cooperative NEURO ASSESSMENT Orientation: time, place and person Hallucinations: No.None noted (Hallucinations) Speech: Normal Gait: normal RESPIRATORY ASSESSMENT Normal expansion.  Clear to auscultation.  No rales, rhonchi, or wheezing. CARDIOVASCULAR ASSESSMENT regular rate and rhythm, S1, S2 normal, no murmur, click, rub or gallop GASTROINTESTINAL ASSESSMENT soft, nontender, BS WNL, no r/g EXTREMITIES normal strength, tone, and muscle mass PLAN OF CARE Provide calm/safe environment. Vital signs assessed twice daily. ED BHU Assessment once each 12-hour shift. Collaborate with intake RN daily or as condition indicates. Assure the ED provider has rounded once each shift. Provide and encourage hygiene. Provide redirection as needed. Assess for escalating behavior; address immediately and inform ED provider.  Assess family dynamic and appropriateness for visitation as needed: Yes.  ; If necessary, describe findings:  Educate the patient/family about BHU procedures/visitation: Yes.  ; If necessary, describe findings:

## 2014-11-10 ENCOUNTER — Inpatient Hospital Stay
Admission: AD | Admit: 2014-11-10 | Discharge: 2014-11-13 | DRG: 885 | Disposition: A | Discharge status: Home / Self Care | Payer: 59 | Source: Ambulatory Visit | Attending: Psychiatry | Admitting: Psychiatry

## 2014-11-10 DIAGNOSIS — F1020 Alcohol dependence, uncomplicated: Secondary | ICD-10-CM | POA: Diagnosis present

## 2014-11-10 DIAGNOSIS — Z823 Family history of stroke: Secondary | ICD-10-CM

## 2014-11-10 DIAGNOSIS — K219 Gastro-esophageal reflux disease without esophagitis: Secondary | ICD-10-CM | POA: Diagnosis present

## 2014-11-10 DIAGNOSIS — Z888 Allergy status to other drugs, medicaments and biological substances status: Secondary | ICD-10-CM | POA: Diagnosis not present

## 2014-11-10 DIAGNOSIS — F332 Major depressive disorder, recurrent severe without psychotic features: Principal | ICD-10-CM

## 2014-11-10 DIAGNOSIS — R45851 Suicidal ideations: Secondary | ICD-10-CM | POA: Diagnosis present

## 2014-11-10 DIAGNOSIS — Z8711 Personal history of peptic ulcer disease: Secondary | ICD-10-CM | POA: Diagnosis not present

## 2014-11-10 DIAGNOSIS — F10239 Alcohol dependence with withdrawal, unspecified: Secondary | ICD-10-CM | POA: Diagnosis present

## 2014-11-10 DIAGNOSIS — Z9049 Acquired absence of other specified parts of digestive tract: Secondary | ICD-10-CM | POA: Diagnosis present

## 2014-11-10 DIAGNOSIS — Z9119 Patient's noncompliance with other medical treatment and regimen: Secondary | ICD-10-CM | POA: Diagnosis present

## 2014-11-10 DIAGNOSIS — F17210 Nicotine dependence, cigarettes, uncomplicated: Secondary | ICD-10-CM | POA: Diagnosis present

## 2014-11-10 DIAGNOSIS — Z79899 Other long term (current) drug therapy: Secondary | ICD-10-CM | POA: Diagnosis not present

## 2014-11-10 DIAGNOSIS — Z9851 Tubal ligation status: Secondary | ICD-10-CM | POA: Diagnosis not present

## 2014-11-10 DIAGNOSIS — Z8249 Family history of ischemic heart disease and other diseases of the circulatory system: Secondary | ICD-10-CM

## 2014-11-10 DIAGNOSIS — F339 Major depressive disorder, recurrent, unspecified: Secondary | ICD-10-CM | POA: Diagnosis present

## 2014-11-10 DIAGNOSIS — F10129 Alcohol abuse with intoxication, unspecified: Secondary | ICD-10-CM | POA: Diagnosis not present

## 2014-11-10 DIAGNOSIS — Z9889 Other specified postprocedural states: Secondary | ICD-10-CM | POA: Diagnosis not present

## 2014-11-10 DIAGNOSIS — F329 Major depressive disorder, single episode, unspecified: Secondary | ICD-10-CM | POA: Diagnosis present

## 2014-11-10 DIAGNOSIS — F1010 Alcohol abuse, uncomplicated: Secondary | ICD-10-CM | POA: Diagnosis present

## 2014-11-10 DIAGNOSIS — G4700 Insomnia, unspecified: Secondary | ICD-10-CM | POA: Diagnosis present

## 2014-11-10 DIAGNOSIS — F10939 Alcohol use, unspecified with withdrawal, unspecified: Secondary | ICD-10-CM | POA: Diagnosis present

## 2014-11-10 LAB — LIPID PANEL
Cholesterol: 240 mg/dL — ABNORMAL HIGH (ref 0–200)
HDL: 77 mg/dL (ref >40)
LDL Cholesterol: 141 mg/dL — ABNORMAL HIGH (ref 0–99)
Total CHOL/HDL Ratio: 3.1 RATIO
Triglycerides: 112 mg/dL (ref <150)
VLDL: 22 mg/dL (ref 0–40)

## 2014-11-10 LAB — TSH: TSH: 1.778 u[IU]/mL (ref 0.350–4.500)

## 2014-11-10 LAB — HEMOGLOBIN A1C: HEMOGLOBIN A1C: 5.3 % (ref 4.0–6.0)

## 2014-11-10 MED ORDER — LORAZEPAM 2 MG/ML IJ SOLN: 0.0000 mg | Freq: Two times a day (BID) | INTRAMUSCULAR | Status: DC

## 2014-11-10 MED ORDER — THIAMINE HCL 100 MG/ML IJ SOLN
100.0000 mg | Freq: Every day | INTRAMUSCULAR | Status: DC
  Administered 2014-11-11: 100 mg via INTRAVENOUS
  Filled 2014-11-10: qty 1

## 2014-11-10 MED ORDER — QUETIAPINE FUMARATE 25 MG PO TABS
25.0000 mg | ORAL_TABLET | Freq: Every evening | ORAL | Status: DC | PRN
  Administered 2014-11-10: 25 mg via ORAL
  Filled 2014-11-10: qty 1

## 2014-11-10 MED ORDER — VITAMIN B-1 100 MG PO TABS
100.0000 mg | ORAL_TABLET | Freq: Every day | ORAL | Status: DC
  Administered 2014-11-11: 100 mg via ORAL
  Filled 2014-11-10 (×2): qty 1

## 2014-11-10 MED ORDER — IBUPROFEN 800 MG PO TABS
800.0000 mg | ORAL_TABLET | Freq: Once | ORAL | Status: AC
  Administered 2014-11-10: 800 mg via ORAL
  Filled 2014-11-10: qty 1

## 2014-11-10 MED ORDER — LORAZEPAM 2 MG PO TABS
2.0000 mg | ORAL_TABLET | Freq: Four times a day (QID) | ORAL | Status: DC
  Administered 2014-11-10 – 2014-11-11 (×3): 2 mg via ORAL
  Filled 2014-11-10 (×3): qty 1

## 2014-11-10 MED ORDER — ALUM & MAG HYDROXIDE-SIMETH 200-200-20 MG/5ML PO SUSP
30.0000 mL | ORAL | Status: DC | PRN
Start: 2014-11-10 — End: 2014-11-13

## 2014-11-10 MED ORDER — LORAZEPAM 2 MG PO TABS: 0.0000 mg | ORAL_TABLET | Freq: Two times a day (BID) | ORAL | Status: DC

## 2014-11-10 MED ORDER — ACETAMINOPHEN 325 MG PO TABS
650.0000 mg | ORAL_TABLET | Freq: Four times a day (QID) | ORAL | Status: DC | PRN
  Administered 2014-11-11 – 2014-11-13 (×6): 650 mg via ORAL
  Filled 2014-11-10 (×6): qty 2

## 2014-11-10 MED ORDER — NICOTINE 21 MG/24HR TD PT24
21.0000 mg | MEDICATED_PATCH | Freq: Every day | TRANSDERMAL | Status: DC
  Administered 2014-11-11 – 2014-11-13 (×3): 21 mg via TRANSDERMAL
  Filled 2014-11-10 (×3): qty 1

## 2014-11-10 MED ORDER — MAGNESIUM HYDROXIDE 400 MG/5ML PO SUSP: 30.0000 mL | Freq: Every day | ORAL | Status: DC | PRN

## 2014-11-10 MED ORDER — LORAZEPAM 2 MG/ML IJ SOLN
0.0000 mg | Freq: Four times a day (QID) | INTRAMUSCULAR | Status: DC
  Administered 2014-11-10: 1 mg via INTRAVENOUS

## 2014-11-10 MED ORDER — PROMETHAZINE HCL 25 MG PO TABS: 12.5000 mg | ORAL_TABLET | Freq: Four times a day (QID) | ORAL | Status: DC | PRN

## 2014-11-10 MED ORDER — LORAZEPAM 2 MG PO TABS
0.0000 mg | ORAL_TABLET | Freq: Four times a day (QID) | ORAL | Status: AC
  Administered 2014-11-10: 2 mg via ORAL
  Administered 2014-11-10: 1 mg via ORAL
  Filled 2014-11-10 (×3): qty 1

## 2014-11-10 MED ORDER — FLUOXETINE HCL 20 MG PO CAPS
40.0000 mg | ORAL_CAPSULE | Freq: Every day | ORAL | Status: DC
  Administered 2014-11-11 – 2014-11-13 (×3): 40 mg via ORAL
  Filled 2014-11-10 (×3): qty 2

## 2014-11-10 MED ORDER — GABAPENTIN 400 MG PO CAPS
400.0000 mg | ORAL_CAPSULE | Freq: Every day | ORAL | Status: DC
  Administered 2014-11-10 – 2014-11-12 (×3): 400 mg via ORAL
  Filled 2014-11-10 (×3): qty 1

## 2014-11-10 MED ORDER — PANCRELIPASE (LIP-PROT-AMYL) 12000-38000 UNITS PO CPEP
12000.0000 [IU] | ORAL_CAPSULE | Freq: Three times a day (TID) | ORAL | Status: DC | PRN
  Filled 2014-11-10 (×3): qty 1

## 2014-11-10 MED ORDER — NICOTINE 10 MG IN INHA
1.0000 | RESPIRATORY_TRACT | Status: DC | PRN
  Filled 2014-11-10: qty 36

## 2014-11-10 NOTE — [facility] Suicide Risk Assessment (Signed)
[facility] Admission Suicide Risk Assessment   Nursing information obtained from:    Demographic factors:  Living alone Current Mental Status:  Suicide plan Loss Factors:  Decline in physical health, NA, Decrease in vocational status Historical Factors:  Prior suicide attempts Risk Reduction Factors:  Positive social support, Positive therapeutic relationship Total Time spent with patient: 45 minutes Principal Problem: Major depression Diagnosis:   Patient Active Problem List   Diagnosis Date Noted  . Major depression [F32.2] 11/10/2014  . Substance induced mood disorder [F19.94] 11/06/2014  . Bilateral knee pain [M25.561, M25.562] 10/14/2014  . Alcohol use disorder, severe, dependence [F10.20] 08/17/2014  . Alcohol abuse [F10.10]   . Suicidal behavior [F48.9]   . Wrist laceration [S61.519A]   . Alcohol dependence [F10.20] 10/02/2013  . Alcohol withdrawal [F10.239] 10/02/2013  . Major depression, recurrent [F33.9] 10/02/2013  . Gastric ulcer [K25.9] 01/20/2012  . Fatty liver [K76.0] 10/11/2011  . 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 Elevated liver function tests [R79.89]   . GERD (gastroesophageal reflux disease) [K21.9]   . 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 HIATAL HERNIA [K44.9] 10/01/2008  . FATTY LIVER DISEASE [K76.89] 10/01/2008     Continued Clinical Symptoms:  Alcohol Use Disorder Identification Test Final Score (AUDIT): 26 The "Alcohol Use Disorders Identification Test", Guidelines for Use in Primary Care, Second Edition.  World Pharmacologist [facility]). Score between 0-7:  no or low risk or alcohol related problems. Score between 8-15:  moderate risk of alcohol related problems. Score between 16-19:  high risk of alcohol related problems. Score 20 or above:  warrants further diagnostic evaluation for alcohol dependence and treatment.   CLINICAL FACTORS:   Depression:   Anhedonia Hopelessness Impulsivity Alcohol/Substance  Abuse/Dependencies   Musculoskeletal: Strength & Muscle Tone: within normal limits Gait & Station: normal Patient leans: N/A  Psychiatric Specialty Exam: Physical Exam  ROS  Blood pressure 149/85, pulse 92, temperature 98 F (36.7 C), temperature source Oral, resp. rate 18, height '5\' 9"'$  (1.753 m), weight 68.493 kg (151 lb), last menstrual period 12/08/2006, SpO2 98 %.Body mass index is 22.29 kg/(m^2).  General Appearance: Disheveled  Eye Contact::  Good  Speech:  Garbled and Slow  Volume:  Decreased  Mood:  Depressed  Affect:  Depressed  Thought Process:  Circumstantial  Orientation:  Full (Time, Place, and Person)  Thought Content:  Negative  Suicidal Thoughts:  Yes.  without intent/plan  Homicidal Thoughts:  No  Memory:  Immediate;   Good Recent;   Fair Remote;   Fair  Judgement:  Impaired  Insight:  Lacking  Psychomotor Activity:  Decreased  Concentration:  Poor  Recall:  Poor  Fund of Knowledge:Fair  Language: Fair  Akathisia:  No  Handed:  Right  AIMS (if indicated):     Assets:  Desire for Improvement Physical Health  Sleep:     Cognition: Impaired,  Mild  ADL's:  Intact     COGNITIVE FEATURES THAT CONTRIBUTE TO RISK:  None    SUICIDE RISK:   Mild:  Suicidal ideation of limited frequency, intensity, duration, and specificity.  There are no identifiable plans, no associated intent, mild dysphoria and related symptoms, good self-control (both objective and subjective assessment), few other risk factors, and identifiable protective factors, including available and accessible social support.  PLAN OF CARE: Patient has been admitted to psychiatry ward. 15 minute checks in place. Alcohol withdrawal protocol in place restart antidepressives and. Daily monitoring of mood. Case discussed with psychiatry staff.  Medical Decision Making:  Review of Psycho-Social Stressors (1), Established Problem, Worsening (2), Review of Medication Regimen & Side Effects (2) and  Review of New Medication or Change in Dosage (2)  I certify that inpatient services furnished can reasonably be expected to improve the patient's condition.   Jakel Alphin 11/10/2014, 3:45 PM

## 2014-11-10 NOTE — ED Notes (Signed)
BEHAVIORAL HEALTH ROUNDING Patient sleeping: Yes.   Patient alert and oriented: not applicable Behavior appropriate: Yes.  ; If no, describe:   Nutrition and fluids offered: No Toileting and hygiene offered: No Sitter present: no Law enforcement present: Yes  and ODS

## 2014-11-10 NOTE — ED Notes (Signed)

## 2014-11-10 NOTE — ED Notes (Signed)
BEHAVIORAL HEALTH ROUNDING Patient sleeping: Yes.   Patient alert and oriented: not applicable Behavior appropriate: Yes.  ; If no, describe:   Nutrition and fluids offered: No Toileting and hygiene offered: No Sitter present: no Law enforcement present: Yes  and ODS  ENVIRONMENTAL ASSESSMENT Potentially harmful objects out of patient reach: Yes.   Personal belongings secured: Yes.   Patient dressed in hospital provided attire only: Yes.   Plastic bags out of patient reach: Yes.   Patient care equipment (cords, cables, call bells, lines, and drains) shortened, removed, or accounted for: Yes.   Equipment and supplies removed from bottom of stretcher: Yes.   Potentially toxic materials out of patient reach: Yes.   Sharps container removed or out of patient reach: Yes.     ED BHU [place] Is the patient under IVC or is there intent for IVC: Yes.   Is the patient medically cleared: Yes.   Is there vacancy in the ED BHU: Yes.   Is the population mix appropriate for patient: Yes.   Is the patient awaiting placement in inpatient or outpatient setting: Yes.   Has the patient had a psychiatric consult: Yes.   Survey of unit performed for contraband, proper placement and condition of furniture, tampering with fixtures in bathroom, shower, and each patient room: Yes.  ; Findings: all clear APPEARANCE/BEHAVIOR sleeping NEURO ASSESSMENT Orientation: pt sleeping Hallucinations: pt sleeping Speech: pt sleeping Gait: pt sleeping RESPIRATORY ASSESSMENT Breathing Pattern: regular, Breath sounds: even and unlabored, sleeping CARDIOVASCULAR ASSESSMENT Pt sleeping GASTROINTESTINAL ASSESSMENT Pt sleeping EXTREMITIES Pt sleeping   PLAN OF CARE Provide calm/safe environment. Vital signs assessed twice daily. ED BHU Assessment once each 12-hour shift. Collaborate with intake RN daily or as condition indicates. Assure the ED provider has rounded once each shift. Provide and encourage  hygiene. Provide redirection as needed. Assess for escalating behavior; address immediately and inform ED provider.  Assess family dynamic and appropriateness for visitation as needed: No.; If necessary, describe findings:   Educate the patient/family about BHU procedures/visitation: No.; If necessary, describe findings:

## 2014-11-10 NOTE — Progress Notes (Signed)
Per Ozzie Hoyle, RN Charge patient is ready to be transferred to [facility].  Nate, RN and Amy, RN completed medical report call and patient is ready to transfer.  This writer informed Olivia Mackie, ED Secretary, Dr. Cinda Quest, and Registration of the pending transfer.     Chesley Noon, MSW, LCSW, [place] Clinical Social Worker [phone]

## 2014-11-10 NOTE — H&P (Signed)
Psychiatric Admission Assessment Adult  Patient Identification: [name] MRN:  [id number] Date of Evaluation:  11/10/2014 Chief Complaint:  major depression Principal Diagnosis: Major depression Diagnosis:   Patient Active Problem List   Diagnosis Date Noted  . Major depression [F32.2] 11/10/2014  . Substance induced mood disorder [F19.94] 11/06/2014  . Bilateral knee pain [M25.561, M25.562] 10/14/2014  . Alcohol use disorder, severe, dependence [F10.20] 08/17/2014  . Alcohol abuse [F10.10]   . Suicidal behavior [F48.9]   . Wrist laceration [S61.519A]   . Alcohol dependence [F10.20] 10/02/2013  . Alcohol withdrawal [F10.239] 10/02/2013  . Major depression, recurrent [F33.9] 10/02/2013  . Gastric ulcer [K25.9] 01/20/2012  . Fatty liver [K76.0] 10/11/2011  . 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 Elevated liver function tests [R79.89]   . GERD (gastroesophageal reflux disease) [K21.9]   . 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 HIATAL HERNIA [K44.9] 10/01/2008  . FATTY LIVER DISEASE [K76.89] 10/01/2008   History of Present Illness:: Patient presented with major depression symptoms with depressed mood suicidal ideation poor appetite poor sleep hopelessness helplessness poor self-care. She had been drinking heavily prior to admission and was intoxicated when she presented. Had not been engaged in outpatient treatment. Elements:  Quality:  Depression with suicidal ideation also heavy alcohol abuse. Severity:  Potentially fatal. Timing:  Worse over the last few weeks. Duration:  Chronic intermittent. Context:  Continued alcohol abuse and treatment noncompliance. Associated Signs/Symptoms: Depression Symptoms:  depressed mood, anhedonia, insomnia, psychomotor retardation, feelings of worthlessness/guilt, difficulty concentrating, impaired memory, recurrent thoughts of death, suicidal thoughts with specific plan, decreased labido, (Hypo) Manic Symptoms:   Distractibility, Impulsivity, Anxiety Symptoms:  Excessive Worry, Psychotic Symptoms:  Paranoia, PTSD Symptoms: Negative Total Time spent with patient: 45 minutes  Past Medical History:  Past Medical History  Diagnosis Date  . GERD (gastroesophageal reflux disease)   . Rosacea   . Elevated liver function tests   . Wears glasses   . Chronic diarrhea   . Alcohol abuse     H/o withdrawal   . PONV (postoperative nausea and vomiting)   . Pancreatitis   . Depression   . Anxiety     Past Surgical History  Procedure Laterality Date  . Cholecystectomy    . Shoulder surgery  [id number]  . Diagnostic mammogram  2008  . Tubal ligation    . Colonoscopy  Never  . Esophagogastroduodenoscopy  03/21/2011    Procedure: ESOPHAGOGASTRODUODENOSCOPY (EGD);  Surgeon: Scarlette Shorts, MD;  Location: [name] ENDOSCOPY;  Service: Endoscopy;  Laterality: N/A;  Patient may need to be done at bedside tomorrow depending on status  . Ercp  10/06/2011    Procedure: ENDOSCOPIC RETROGRADE CHOLANGIOPANCREATOGRAPHY (ERCP);  Surgeon: Beryle Beams, MD;  Location: [facility] ENDOSCOPY;  Service: Endoscopy;  Laterality: N/A;  . Laparotomy  01/03/2012    Procedure: EXPLORATORY LAPAROTOMY;  Surgeon: Pedro Earls, MD;  Location: [facility] ORS;  Service: General;  Laterality: N/A;  debridement of necrotic pancreas and placement of drains x2  . Esophagogastroduodenoscopy  01/06/2012    Procedure: ESOPHAGOGASTRODUODENOSCOPY (EGD);  Surgeon: Wonda Horner, MD;  Location: [facility] ENDOSCOPY;  Service: Endoscopy;  Laterality: N/A;  bedside   Family History:  Family History  Problem Relation Age of Onset  . Stroke Mother   . Heart disease Father   . Heart failure Father   . Heart disease Sister    Social History:  History  Alcohol Use  . 1.5 - 2.0 oz/week  . 3-4 Standard drinks or equivalent per week  Comment: has been binge drinking since the weekend.     History  Drug Use No    Comment: THC tiny bowls on week-ends, last use 08/15/14     History   Social History  . Marital Status: Divorced    Spouse Name: N/A  . Number of Children: N/A  . Years of Education: N/A   Social History Main Topics  . Smoking status: Light Tobacco Smoker -- 0.25 packs/day for 15 years  . Smokeless tobacco: Never Used  . Alcohol Use: 1.5 - 2.0 oz/week    3-4 Standard drinks or equivalent per week     Comment: has been binge drinking since the weekend.  . Drug Use: No     Comment: THC tiny bowls on week-ends, last use 08/15/14  . 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   Other Topics Concern  . None   Social History Narrative   Has a son and some friends with whom she is close. Was in rehab in 09/2010 and was sober for a few months after.    Additional Social History:                          Musculoskeletal: Strength & Muscle Tone: decreased Gait & Station: normal Patient leans: N/A  Psychiatric Specialty Exam: Physical Exam  Constitutional: She appears well-developed and well-nourished.  HENT:  Head: Normocephalic and atraumatic.  Eyes: Conjunctivae are normal. Pupils are equal, round, and reactive to light.  Neck: Normal range of motion.  Cardiovascular: Normal heart sounds.   Respiratory: Effort normal.  GI: Soft.  Musculoskeletal: Normal range of motion.  Neurological: She is alert.  Skin: Skin is warm and dry.  Psychiatric: Her affect is blunt. Her speech is delayed. She is slowed and withdrawn. Cognition and memory are impaired. She expresses impulsivity. She exhibits a depressed mood. She expresses suicidal ideation.    Review of Systems  Constitutional: Negative.   HENT: Negative.   Eyes: Negative.   Respiratory: Negative.   Cardiovascular: Negative.   Gastrointestinal: Positive for nausea.  Musculoskeletal: Negative.   Skin: Negative.   Neurological: Positive for tremors.  Psychiatric/Behavioral: Positive for depression, suicidal ideas, memory loss and  substance abuse. Negative for hallucinations. The patient is nervous/anxious and has insomnia.     Pulse 92, temperature 98 F (36.7 C), temperature source Oral, resp. rate 18, height '5\' 9"'$  (1.753 m), weight 68.493 kg (151 lb), last menstrual period 12/08/2006, SpO2 98 %.Body mass index is 22.29 kg/(m^2).  General Appearance: Disheveled  Eye Sport and exercise psychologist::  Fair  Speech:  Garbled, Slow and Slurred  Volume:  Decreased  Mood:  Depressed  Affect:  Depressed  Thought Process:  Circumstantial  Orientation:  Full (Time, Place, and Person)  Thought Content:  Negative  Suicidal Thoughts:  Yes.  without intent/plan  Homicidal Thoughts:  No  Memory:  Immediate;   Good Recent;   Poor Remote;   Poor  Judgement:  Impaired  Insight:  Lacking  Psychomotor Activity:  Decreased  Concentration:  Fair  Recall:  [place]  Language: Good  Akathisia:  No  Handed:  Right  AIMS (if indicated):     Assets:  Desire for Improvement Resilience Social Support  ADL's:  Intact  Cognition: Impaired,  Mild  Sleep:      Risk to Self:   Risk to Others:   Prior Inpatient Therapy:  Prior Outpatient Therapy:    Alcohol Screening: 1. How often do you have a drink containing alcohol?: 4 or more times a week 2. How many drinks containing alcohol do you have on a typical day when you are drinking?: 7, 8, or 9 3. How often do you have six or more drinks on one occasion?: Daily or almost daily Preliminary Score: 7 4. How often during the last year have you found that you were not able to stop drinking once you had started?: Monthly 5. How often during the last year have you failed to do what was normally expected from you becasue of drinking?: Less than monthly 6. How often during the last year have you needed a first drink in the morning to get yourself going after a heavy drinking session?: Less than monthly 7. How often during the last year have you had a feeling of guilt of remorse after  drinking?: Monthly 8. How often during the last year have you been unable to remember what happened the night before because you had been drinking?: Less than monthly 9. Have you or someone else been injured as a result of your drinking?: Yes, during the last year 10. Has a relative or friend or a doctor or another health worker been concerned about your drinking or suggested you cut down?: Yes, during the last year Alcohol Use Disorder Identification Test Final Score (AUDIT): 26 Brief Intervention: MD notified of score 20 or above  Allergies:   Allergies  Allergen Reactions  . Ciprofloxacin Nausea Only   Lab Results:  Results for orders placed or performed during the hospital encounter of 11/10/14 (from the past 48 hour(s))  Hemoglobin A1c     Status: None   Collection Time: 11/10/14 12:31 PM  Result Value Ref Range   Hgb A1c MFr Bld 5.3 4.0 - 6.0 %  Lipid panel, fasting     Status: Abnormal   Collection Time: 11/10/14 12:31 PM  Result Value Ref Range   Cholesterol 240 (H) 0 - 200 mg/dL   Triglycerides 112 <150 mg/dL   HDL 77 >40 mg/dL   Total CHOL/HDL Ratio 3.1 RATIO   VLDL 22 0 - 40 mg/dL   LDL Cholesterol 141 (H) 0 - 99 mg/dL    Comment:        Total Cholesterol/HDL:CHD Risk Coronary Heart Disease Risk Table                     Men   Women  1/2 Average Risk   3.4   3.3  Average Risk       5.0   4.4  2 X Average Risk   9.6   7.1  3 X Average Risk  23.4   11.0        Use the calculated Patient Ratio above and the CHD Risk Table to determine the patient's CHD Risk.        ATP III CLASSIFICATION (LDL):  <100     mg/dL   Optimal  100-129  mg/dL   Near or Above                    Optimal  130-159  mg/dL   Borderline  160-189  mg/dL   High  >190     mg/dL   Very High   TSH     Status: None   Collection Time: 11/10/14 12:31 PM  Result Value Ref Range   TSH 1.778 0.350 - 4.500  uIU/mL   Current Medications: Current Facility-Administered Medications  Medication Dose  Route Frequency Provider Last Rate Last Dose  . acetaminophen (TYLENOL) tablet 650 mg  650 mg Oral Q6H PRN Gonzella Lex, MD      . alum & mag hydroxide-simeth (MAALOX/MYLANTA) 200-200-20 MG/5ML suspension 30 mL  30 mL Oral Q4H PRN Gonzella Lex, MD      . Derrill Memo ON 11/11/2014] FLUoxetine (PROZAC) capsule 40 mg  40 mg Oral Daily Gonzella Lex, MD      . gabapentin (NEURONTIN) capsule 400 mg  400 mg Oral QHS Gonzella Lex, MD      . lipase/protease/amylase (CREON) capsule 12,000 Units  12,000 Units Oral TID WC PRN Gonzella Lex, MD      . LORazepam (ATIVAN) injection 0-4 mg  0-4 mg Intravenous 4 times per day Gonzella Lex, MD   1 mg at 11/10/14 1236  . LORazepam (ATIVAN) injection 0-4 mg  0-4 mg Intravenous Q12H Merdis Snodgrass T Maylen Waltermire, MD      . LORazepam (ATIVAN) tablet 0-4 mg  0-4 mg Oral 4 times per day Gonzella Lex, MD      . LORazepam (ATIVAN) tablet 0-4 mg  0-4 mg Oral Q12H Gonzella Lex, MD      . LORazepam (ATIVAN) tablet 2 mg  2 mg Oral Q6H Gonzella Lex, MD   2 mg at 11/10/14 1308  . magnesium hydroxide (MILK OF MAGNESIA) suspension 30 mL  30 mL Oral Daily PRN Gonzella Lex, MD      . Derrill Memo ON 11/11/2014] nicotine (NICODERM CQ - dosed in mg/24 hours) patch 21 mg  21 mg Transdermal Daily Preciliano Castell T Abbie Berling, MD      . nicotine (NICOTROL) 10 MG inhaler 1 continuous puffing  1 continuous puffing Inhalation PRN Gonzella Lex, MD      . promethazine (PHENERGAN) tablet 12.5 mg  12.5 mg Oral Q6H PRN Gonzella Lex, MD      . QUEtiapine (SEROQUEL) tablet 25 mg  25 mg Oral QHS PRN Gonzella Lex, MD      . Derrill Memo ON 11/11/2014] thiamine (B-1) injection 100 mg  100 mg Intravenous Daily Gonzella Lex, MD      . Derrill Memo ON 11/11/2014] thiamine (VITAMIN B-1) tablet 100 mg  100 mg Oral Daily Gonzella Lex, MD       PTA Medications: Prescriptions prior to admission  Medication Sig Dispense Refill Last Dose  . FLUoxetine (PROZAC) 40 MG capsule Take 1 capsule (40 mg total) by mouth daily. For depression  (Patient taking differently: Take 40 mg by mouth daily. ) 30 capsule 0 unknown at unknown  . gabapentin (NEURONTIN) 400 MG capsule Take 1 capsule (400 mg total) by mouth 4 (four) times daily -  before meals and at bedtime. For agitation/substance withdrawal syndrome (Patient taking differently: Take 400-1,200 mg by mouth 2 (two) times daily. Pt takes one in the morning and three at bedtime.) 120 capsule 0 unknown at unknown   . naltrexone (DEPADE) 50 MG tablet Take 25 mg by mouth at bedtime.   unknown at unknown  . QUEtiapine (SEROQUEL) 25 MG tablet Take 1 tablet (25 mg total) by mouth 2 (two) times daily. For agitation/sleep (Patient taking differently: Take 25 mg by mouth at bedtime. For agitation/sleep) 60 tablet 0 unknown at unknown  . traZODone (DESYREL) 100 MG tablet Take 100 mg by mouth at bedtime.   unknown at unknown    Previous Psychotropic  Medications: Yes   Substance Abuse History in the last 12 months:  Yes.      Consequences of Substance Abuse: Medical Consequences:  History of pancreatitis Family Consequences:  Alienated from family Blackouts:   Withdrawal Symptoms:   Tremors Vomiting  Results for orders placed or performed during the hospital encounter of 11/10/14 (from the past 72 hour(s))  Hemoglobin A1c     Status: None   Collection Time: 11/10/14 12:31 PM  Result Value Ref Range   Hgb A1c MFr Bld 5.3 4.0 - 6.0 %  Lipid panel, fasting     Status: Abnormal   Collection Time: 11/10/14 12:31 PM  Result Value Ref Range   Cholesterol 240 (H) 0 - 200 mg/dL   Triglycerides 112 <150 mg/dL   HDL 77 >40 mg/dL   Total CHOL/HDL Ratio 3.1 RATIO   VLDL 22 0 - 40 mg/dL   LDL Cholesterol 141 (H) 0 - 99 mg/dL    Comment:        Total Cholesterol/HDL:CHD Risk Coronary Heart Disease Risk Table                     Men   Women  1/2 Average Risk   3.4   3.3  Average Risk       5.0   4.4  2 X Average Risk   9.6   7.1  3 X Average Risk  23.4   11.0        Use the calculated  Patient Ratio above and the CHD Risk Table to determine the patient's CHD Risk.        ATP III CLASSIFICATION (LDL):  <100     mg/dL   Optimal  100-129  mg/dL   Near or Above                    Optimal  130-159  mg/dL   Borderline  160-189  mg/dL   High  >190     mg/dL   Very High   TSH     Status: None   Collection Time: 11/10/14 12:31 PM  Result Value Ref Range   TSH 1.778 0.350 - 4.500 uIU/mL    Observation Level/Precautions:  15 minute checks  Laboratory:  CBC Chemistry Profile UA  Psychotherapy:    Medications:    Consultations:    Discharge Concerns:    Estimated LOS:  Other:     Psychological Evaluations: No   Treatment Plan Summary: Daily contact with patient to assess and evaluate symptoms and progress in treatment, Medication management and Plan Patient will be continued on detox protocol and I have added additional standing Ativan for the next day because she is so agitated and tremulous. Phenergan when necessary for nausea and vomiting. Continue other medications including restarting her antidepressive. Daily management and evaluation of both withdrawal symptoms and depression  Medical Decision Making:  Review of Psycho-Social Stressors (1), Established Problem, Worsening (2), Review or order medicine tests (1), Review of Medication Regimen & Side Effects (2) and Review of New Medication or Change in Dosage (2)  I certify that inpatient services furnished can reasonably be expected to improve the patient's condition.   Anita Mcadory 7/23/20163:39 PM

## 2014-11-10 NOTE — ED Notes (Signed)
Assessment completed  CIWA scale is in use  Per Claudette Head pt admission to [facility] has been delayed due to staffing issues - pt informed

## 2014-11-10 NOTE — ED Provider Notes (Signed)
-----------------------------------------   7:25 AM on 11/10/2014 -----------------------------------------   Blood pressure 133/84, pulse 84, temperature 98.4 F (36.9 C), temperature source Oral, resp. rate 18, height '5\' 6"'$  (1.676 m), weight 160 lb (72.576 kg), last menstrual period 12/08/2006, SpO2 100 %.  The patient had no acute events since last update.  Calm and cooperative at this time.  Disposition is pending per Psychiatry/Behavioral Medicine team recommendations.     Nena Polio, MD 11/10/14 [phone]

## 2014-11-10 NOTE — ED Notes (Addendum)
Pt had requested to leave; pt oriented to IVC, pt states "I signed a paper that says I can leave anytime I want", pt reoriented to circumstance of her IVC and behavior, pt tearful, "I'm worried about my babies [cats]" pt asking for more ativan or wine.  Pt directed to pharmacological protocol and timing.

## 2014-11-10 NOTE — ED Notes (Signed)
Room assigned and report given to Nate RN  1/1 bags of belongings to be transferred with her    Appropriate to stimulation  No verbalized needs or concerns at this time  NAD assessed  Continue to monitor

## 2014-11-10 NOTE — ED Notes (Signed)
Pt observed lying in bed with the TV on  NAD  Continue to monitor

## 2014-11-10 NOTE — ED Notes (Signed)

## 2014-11-10 NOTE — ED Notes (Signed)
Verbal order obtained for '800mg'$  ibuprofen

## 2014-11-10 NOTE — ED Notes (Signed)
meds administered as ordered

## 2014-11-10 NOTE — Progress Notes (Signed)
Patient pleasant and cooperative during admission assessment. Patient denies SI/HI at this time. Patient denies Auditory/ Visual Hallucinations. Patient informed of fall risk status, fall risk assessed Moderate at this time. Patient oriented to unit/staff/room. Patient denies any questions/concerns at this time. Patient safe on unit with Q15 minute checks for safety. Patient was searched, skin was intact, however, bruising was noted on her forehand and both chins and no contraband found.
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
## 2014-11-10 NOTE — BHH Group Notes (Signed)
[facility] Group Therapy  11/10/2014 2:32 PM  Type of Therapy:  Group Therapy  Participation Level:  Did Not Attend  Modes of Intervention:  Discussion, Education, Socialization and Support  Summary of Progress/Problems:Communications: Patients identify how individuals communicate with one another appropriately and inappropriately. Patients will be guided to discuss their thoughts, feelings, and behaviors related to barriers when communicating. The group will process together ways to execute positive and appropriate communications   [place] MSW, [place]  11/10/2014, 2:32 PM

## 2014-11-10 NOTE — ED Notes (Signed)
Breakfast provided along with an extra juice   Pt is sitting up in bed  NAD observed  Pt to be admitted to [facility]  Received in report that I can call after 0800   Continue to monitor

## 2014-11-10 NOTE — ED Notes (Signed)
BEHAVIORAL HEALTH ROUNDING Patient sleeping: No. Patient alert and oriented: yes Behavior appropriate: Yes.  ; If no, describe:  Nutrition and fluids offered: yes Toileting and hygiene offered: Yes  Sitter present: q15 minute observations and security camera monitoring Law enforcement present: Yes  ODS  

## 2014-11-10 NOTE — Tx Team (Signed)
Initial Interdisciplinary Treatment Plan   PATIENT STRESSORS: Loss of family and friend Substance abuse  Financial Concerns   PATIENT STRENGTHS: Ability for insight Capable of independent living General fund of knowledge Supportive family/friends   PROBLEM LIST: Problem List/Patient Goals Date to be addressed Date deferred Reason deferred Estimated date of resolution  Substance Abuse 11/10/2014     Major Depression 11/10/2014                                                DISCHARGE CRITERIA:  Ability to meet basic life and health needs Improved stabilization in mood, thinking, and/or behavior Safe-care adequate arrangements made  PRELIMINARY DISCHARGE PLAN: Return to previous living arrangement  PATIENT/FAMIILY INVOLVEMENT: This treatment plan has been presented to and reviewed with the patient, [name], and/or family member, none.  The patient and family have been given the opportunity to ask questions and make suggestions.  [name] 11/10/2014, 2:46 PM

## 2014-11-10 NOTE — ED Notes (Signed)
Pt ringing call bell; requesting pain meds for right hip pain 8/10, and requesting more ativan, pt explained that ativan was need based to alleviate withdrawal s/sx from ETOH abuse, pt then requested wine

## 2014-11-11 MED ORDER — VITAMIN B-1 100 MG PO TABS
100.0000 mg | ORAL_TABLET | Freq: Every day | ORAL | Status: DC
  Administered 2014-11-13: 100 mg via ORAL
  Filled 2014-11-11 (×2): qty 1

## 2014-11-11 MED ORDER — QUETIAPINE FUMARATE 25 MG PO TABS
25.0000 mg | ORAL_TABLET | Freq: Every evening | ORAL | Status: DC | PRN
  Administered 2014-11-11: 50 mg via ORAL
  Administered 2014-11-12: 25 mg via ORAL
  Filled 2014-11-11: qty 2

## 2014-11-11 MED ORDER — GABAPENTIN 100 MG PO CAPS
100.0000 mg | ORAL_CAPSULE | Freq: Every day | ORAL | Status: DC
  Administered 2014-11-12 – 2014-11-13 (×2): 100 mg via ORAL
  Filled 2014-11-11 (×2): qty 1

## 2014-11-11 MED ORDER — GABAPENTIN 300 MG PO CAPS
300.0000 mg | ORAL_CAPSULE | Freq: Every day | ORAL | Status: DC
  Filled 2014-11-11: qty 1

## 2014-11-11 MED ORDER — THIAMINE HCL 100 MG/ML IJ SOLN
100.0000 mg | Freq: Every day | INTRAMUSCULAR | Status: DC
  Filled 2014-11-11 (×2): qty 1

## 2014-11-11 NOTE — Progress Notes (Signed)
Denies SI.  Complains of anxiety. Safety maintain.  Keeps to herself.

## 2014-11-11 NOTE — BHH Suicide Risk Assessment (Signed)
[facility] INPATIENT:  Family/Significant Other Suicide Prevention Education  Suicide Prevention Education:  Patient Refusal for Family/Significant Other Suicide Prevention Education: The patient [name] has refused to provide written consent for family/significant other to be provided Family/Significant Other Suicide Prevention Education during admission and/or prior to discharge.  Physician notified.  SPE completed with pt and she was encouraged to share information provided in SPI pamphlet with support network, ask questions, and talk about any concerns relating to SPE. Pt denies SI/HI/AVH and verbalized understanding of information presented.    [place] MSW, [place]  11/11/2014, 11:04 AM

## 2014-11-11 NOTE — Progress Notes (Signed)
[facility] MD Progress Note  11/11/2014 11:59 PM [name]  MRN:  [id number] Subjective:  Follow-up for this 59 year old woman with alcohol abuse depression and suicidal thinking. Completing detox. Mood improving Principal Problem: Major depression Diagnosis:   Patient Active Problem List   Diagnosis Date Noted  . Major depression [F32.2] 11/10/2014  . Substance induced mood disorder [F19.94] 11/06/2014  . Bilateral knee pain [M25.561, M25.562] 10/14/2014  . Alcohol use disorder, severe, dependence [F10.20] 08/17/2014  . Alcohol abuse [F10.10]   . Suicidal behavior [F48.9]   . Wrist laceration [S61.519A]   . Alcohol dependence [F10.20] 10/02/2013  . Alcohol withdrawal [F10.239] 10/02/2013  . Major depression, recurrent [F33.9] 10/02/2013  . Gastric ulcer [K25.9] 01/20/2012  . Fatty liver [K76.0] 10/11/2011  . 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 Elevated liver function tests [R79.89]   . GERD (gastroesophageal reflux disease) [K21.9]   . 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 HIATAL HERNIA [K44.9] 10/01/2008  . FATTY LIVER DISEASE [K76.89] 10/01/2008   Total Time spent with patient: 30 minutes   Past Medical History:  Past Medical History  Diagnosis Date  . GERD (gastroesophageal reflux disease)   . Rosacea   . Elevated liver function tests   . Wears glasses   . Chronic diarrhea   . Alcohol abuse     H/o withdrawal   . PONV (postoperative nausea and vomiting)   . Pancreatitis   . Depression   . Anxiety     Past Surgical History  Procedure Laterality Date  . Cholecystectomy    . Shoulder surgery  [id number]  . Diagnostic mammogram  2008  . Tubal ligation    . Colonoscopy  Never  . Esophagogastroduodenoscopy  03/21/2011    Procedure: ESOPHAGOGASTRODUODENOSCOPY (EGD);  Surgeon: Scarlette Shorts, MD;  Location: [facility] ENDOSCOPY;  Service: Endoscopy;  Laterality: N/A;  Patient may need to be done at bedside tomorrow depending on status  . Ercp  10/06/2011   Procedure: ENDOSCOPIC RETROGRADE CHOLANGIOPANCREATOGRAPHY (ERCP);  Surgeon: Beryle Beams, MD;  Location: [facility] ENDOSCOPY;  Service: Endoscopy;  Laterality: N/A;  . Laparotomy  01/03/2012    Procedure: EXPLORATORY LAPAROTOMY;  Surgeon: Pedro Earls, MD;  Location: [facility] ORS;  Service: General;  Laterality: N/A;  debridement of necrotic pancreas and placement of drains x2  . Esophagogastroduodenoscopy  01/06/2012    Procedure: ESOPHAGOGASTRODUODENOSCOPY (EGD);  Surgeon: Wonda Horner, MD;  Location: [facility] ENDOSCOPY;  Service: Endoscopy;  Laterality: N/A;  bedside   Family History:  Family History  Problem Relation Age of Onset  . Stroke Mother   . Heart disease Father   . Heart failure Father   . Heart disease Sister    Social History:  History  Alcohol Use  . 1.5 - 2.0 oz/week  . 3-4 Standard drinks or equivalent per week    Comment: has been binge drinking since the weekend.     History  Drug Use No    Comment: THC tiny bowls on week-ends, last use 08/15/14    History   Social History  . Marital Status: Divorced    Spouse Name: N/A  . Number of Children: N/A  . Years of Education: N/A   Social History Main Topics  . Smoking status: Light Tobacco Smoker -- 0.25 packs/day for 15 years  . Smokeless tobacco: Never Used  . Alcohol Use: 1.5 - 2.0 oz/week    3-4 Standard drinks or equivalent per week     Comment: has been binge drinking since the weekend.  Marland Kitchen  Drug Use: No     Comment: THC tiny bowls on week-ends, last use 08/15/14  . 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   Other Topics Concern  . None   Social History Narrative   Has a son and some friends with whom she is close. Was in rehab in 09/2010 and was sober for a few months after.    Additional History:    Sleep: Fair  Appetite:  Fair   Assessment: Doing better. Not shaky. Blood pressure a little elevated. No suicidal ideation. He can be taken off of Ativan in  anticipation of discharge hopefully within the next day.  Musculoskeletal: Strength & Muscle Tone: within normal limits Gait & Station: normal Patient leans: N/A   Psychiatric Specialty Exam: Physical Exam  Constitutional: She appears well-developed and well-nourished.  HENT:  Head: Normocephalic and atraumatic.  Eyes: Conjunctivae are normal. Pupils are equal, round, and reactive to light.  Neck: Normal range of motion.  Cardiovascular: Normal heart sounds.   Respiratory: Effort normal.  GI: Soft.  Musculoskeletal: Normal range of motion.  Neurological: She is alert.  Skin: Skin is warm and dry.  Psychiatric: She has a normal mood and affect. Her speech is normal and behavior is normal. Judgment and thought content normal. Cognition and memory are normal.    Review of Systems  Constitutional: Negative.   HENT: Negative.   Eyes: Negative.   Respiratory: Negative.   Cardiovascular: Negative.   Gastrointestinal: Negative.   Musculoskeletal: Negative.   Skin: Negative.   Neurological: Negative.   Psychiatric/Behavioral: Positive for substance abuse. Negative for depression, suicidal ideas and hallucinations. The patient has insomnia. The patient is not nervous/anxious.     Blood pressure 147/95, pulse 84, temperature 98.1 F (36.7 C), temperature source Oral, resp. rate 20, height '5\' 9"'$  (1.753 m), weight 68.493 kg (151 lb), last menstrual period 12/08/2006, SpO2 98 %.Body mass index is 22.29 kg/(m^2).  General Appearance: Casual  Eye Contact::  Good  Speech:  Clear and Coherent  Volume:  Normal  Mood:  Euthymic  Affect:  Congruent  Thought Process:  Goal Directed  Orientation:  Full (Time, Place, and Person)  Thought Content:  Negative  Suicidal Thoughts:  No  Homicidal Thoughts:  No  Memory:  Immediate;   Good Recent;   Fair Remote;   Fair  Judgement:  Intact  Insight:  Fair  Psychomotor Activity:  Normal  Concentration:  Good  Recall:  [place] of  Knowledge:Fair  Language: Good  Akathisia:  No  Handed:  Right  AIMS (if indicated):     Assets:  Communication Skills Housing Social Support  ADL's:  Intact  Cognition: WNL  Sleep:        Current Medications: Current Facility-Administered Medications  Medication Dose Route Frequency Provider Last Rate Last Dose  . acetaminophen (TYLENOL) tablet 650 mg  650 mg Oral Q6H PRN Gonzella Lex, MD   650 mg at 11/11/14 2033  . alum & mag hydroxide-simeth (MAALOX/MYLANTA) 200-200-20 MG/5ML suspension 30 mL  30 mL Oral Q4H PRN Gonzella Lex, MD      . FLUoxetine (PROZAC) capsule 40 mg  40 mg Oral Daily Gonzella Lex, MD   40 mg at 11/11/14 0858  . [START ON 11/12/2014] gabapentin (NEURONTIN) capsule 100 mg  100 mg Oral Q breakfast Gonzella Lex, MD      . gabapentin (NEURONTIN) capsule 300 mg  300  mg Oral QHS Gonzella Lex, MD   300 mg at 11/11/14 2200  . gabapentin (NEURONTIN) capsule 400 mg  400 mg Oral QHS Gonzella Lex, MD   400 mg at 11/11/14 2109  . lipase/protease/amylase (CREON) capsule 12,000 Units  12,000 Units Oral TID WC PRN Gonzella Lex, MD      . LORazepam (ATIVAN) tablet 0-4 mg  0-4 mg Oral Q12H Gonzella Lex, MD   0 mg at 11/10/14 1801  . magnesium hydroxide (MILK OF MAGNESIA) suspension 30 mL  30 mL Oral Daily PRN Gonzella Lex, MD      . nicotine (NICODERM CQ - dosed in mg/24 hours) patch 21 mg  21 mg Transdermal Daily Gonzella Lex, MD   21 mg at 11/11/14 0858  . nicotine (NICOTROL) 10 MG inhaler 1 continuous puffing  1 continuous puffing Inhalation PRN Gonzella Lex, MD      . promethazine (PHENERGAN) tablet 12.5 mg  12.5 mg Oral Q6H PRN Gonzella Lex, MD      . QUEtiapine (SEROQUEL) tablet 25-50 mg  25-50 mg Oral QHS PRN Gonzella Lex, MD   50 mg at 11/11/14 2108  . [START ON 11/12/2014] thiamine (VITAMIN B-1) tablet 100 mg  100 mg Oral Daily Dewain Penning, MD       Or  . Derrill Memo ON 11/12/2014] thiamine (B-1) injection 100 mg  100 mg Intravenous Daily Dewain Penning, MD        Lab Results:  Results for orders placed or performed during the hospital encounter of 11/10/14 (from the past 48 hour(s))  Hemoglobin A1c     Status: None   Collection Time: 11/10/14 12:31 PM  Result Value Ref Range   Hgb A1c MFr Bld 5.3 4.0 - 6.0 %  Lipid panel, fasting     Status: Abnormal   Collection Time: 11/10/14 12:31 PM  Result Value Ref Range   Cholesterol 240 (H) 0 - 200 mg/dL   Triglycerides 112 <150 mg/dL   HDL 77 >40 mg/dL   Total CHOL/HDL Ratio 3.1 RATIO   VLDL 22 0 - 40 mg/dL   LDL Cholesterol 141 (H) 0 - 99 mg/dL    Comment:        Total Cholesterol/HDL:CHD Risk Coronary Heart Disease Risk Table                     Men   Women  1/2 Average Risk   3.4   3.3  Average Risk       5.0   4.4  2 X Average Risk   9.6   7.1  3 X Average Risk  23.4   11.0        Use the calculated Patient Ratio above and the CHD Risk Table to determine the patient's CHD Risk.        ATP III CLASSIFICATION (LDL):  <100     mg/dL   Optimal  100-129  mg/dL   Near or Above                    Optimal  130-159  mg/dL   Borderline  160-189  mg/dL   High  >190     mg/dL   Very High   TSH     Status: None   Collection Time: 11/10/14 12:31 PM  Result Value Ref Range   TSH 1.778 0.350 - 4.500 uIU/mL    Physical Findings: AIMS: Facial  and Oral Movements Muscles of Facial Expression: None, normal Lips and Perioral Area: None, normal Jaw: None, normal Tongue: None, normal,Extremity Movements Upper (arms, wrists, hands, fingers): None, normal Lower (legs, knees, ankles, toes): None, normal, Trunk Movements Neck, shoulders, hips: None, normal, Overall Severity Severity of abnormal movements (highest score from questions above): None, normal Incapacitation due to abnormal movements: None, normal Patient's awareness of abnormal movements (rate only patient's report): No Awareness, Dental Status Current problems with teeth and/or dentures?: No Does patient usually wear  dentures?: No  CIWA:  CIWA-Ar Total: 2 COWS:     Treatment Plan Summary: Daily contact with patient to assess and evaluate symptoms and progress in treatment, Medication management and Plan Patient is not shaky and has no sign of delirium. Discontinue the standing Ativan although the CIWA protocol is still in place. Continue antidepressives as prescribed. Supportive counseling with her focusing on the importance of sobriety outside the hospital. She is wanting to be discharged tomorrow. Treatment team can reevaluate in the morning.   Medical Decision Making:  Review of Psycho-Social Stressors (1), Order AIMS Test (2), Review of Medication Regimen & Side Effects (2) and Review of New Medication or Change in Dosage (2)     Delmy Holdren 11/11/2014, 11:59 PM

## 2014-11-11 NOTE — Plan of Care (Signed)
Problem: Alteration in mood Goal: STG-Patient is able to discuss feelings and issues (Patient is able to discuss feelings and issues leading to depression)  Outcome: Not Progressing Patient tearful, unable verbalize any coping skills.

## 2014-11-11 NOTE — BHH Group Notes (Signed)
[facility] [facility] LCSW Group Therapy  11/11/2014 2:33 PM  Type of Therapy:  Group Therapy  Participation Level:  Did Not Attend  Modes of Intervention:  Discussion, Education, Socialization and Support  Summary of Progress/Problems:Pts were asked to identify what balance means to them. They were encouraged to identify what throws them off balance and how to regain balance.   Sarah Ann MSW, [place]  11/11/2014, 2:33 PM

## 2014-11-11 NOTE — Progress Notes (Signed)
Patient cooperative with treatment. Patient denies SI/HI at this time. Patient denies Auditory/ Visual Hallucinations. No withdrawal symptoms noted on shift at this time. She has been cooperative with treatment. Patient denies any questions/concerns at this time. Patient safe on unit with Q15 minute checks for safety. Appears to be sleep resting in bed at this time.

## 2014-11-11 NOTE — Plan of Care (Signed)
Problem: Alteration in mood Goal: STG-Patient is able to discuss feelings and issues (Patient is able to discuss feelings and issues leading to depression)  Outcome: Progressing Patient is able to verbalize feelings and she has an understanding that she needs help.
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
## 2014-11-12 DIAGNOSIS — F332 Major depressive disorder, recurrent severe without psychotic features: Secondary | ICD-10-CM | POA: Insufficient documentation

## 2014-11-12 MED ORDER — QUETIAPINE FUMARATE 25 MG PO TABS
25.0000 mg | ORAL_TABLET | Freq: Three times a day (TID) | ORAL | Status: DC
  Administered 2014-11-12 – 2014-11-13 (×3): 25 mg via ORAL
  Filled 2014-11-12 (×3): qty 1

## 2014-11-12 NOTE — Progress Notes (Signed)
[name] MD Progress Note  11/12/2014 2:19 PM [name]  MRN:  [id number] Subjective:  Follow-up for this 59 year old woman with alcohol abuse depression and suicidal thinking. Completing detox. Mood improving cc " I am doing good. I am doing much better.  I am alcoholic." Principal Problem: Major depression Diagnosis:   Patient Active Problem List   Diagnosis Date Noted  . Major depressive disorder, recurrent, severe without psychotic features [F33.2]   . Major depression [F32.2] 11/10/2014  . Substance induced mood disorder [F19.94] 11/06/2014  . Bilateral knee pain [M25.561, M25.562] 10/14/2014  . Alcohol use disorder, severe, dependence [F10.20] 08/17/2014  . Alcohol abuse [F10.10]   . Suicidal behavior [F48.9]   . Wrist laceration [S61.519A]   . Alcohol dependence [F10.20] 10/02/2013  . Alcohol withdrawal [F10.239] 10/02/2013  . Major depression, recurrent [F33.9] 10/02/2013  . Gastric ulcer [K25.9] 01/20/2012  . Fatty liver [K76.0] 10/11/2011  . 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 Elevated liver function tests [R79.89]   . GERD (gastroesophageal reflux disease) [K21.9]   . 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 HIATAL HERNIA [K44.9] 10/01/2008  . FATTY LIVER DISEASE [K76.89] 10/01/2008   Total Time spent with patient: 30 minutes   Past Medical History:  Past Medical History  Diagnosis Date  . GERD (gastroesophageal reflux disease)   . Rosacea   . Elevated liver function tests   . Wears glasses   . Chronic diarrhea   . Alcohol abuse     H/o withdrawal   . PONV (postoperative nausea and vomiting)   . Pancreatitis   . Depression   . Anxiety     Past Surgical History  Procedure Laterality Date  . Cholecystectomy    . Shoulder surgery  [id number]  . Diagnostic mammogram  2008  . Tubal ligation    . Colonoscopy  Never  . Esophagogastroduodenoscopy  03/21/2011    Procedure: ESOPHAGOGASTRODUODENOSCOPY (EGD);  Surgeon: Scarlette Shorts, MD;  Location:  [name] ENDOSCOPY;  Service: Endoscopy;  Laterality: N/A;  Patient may need to be done at bedside tomorrow depending on status  . Ercp  10/06/2011    Procedure: ENDOSCOPIC RETROGRADE CHOLANGIOPANCREATOGRAPHY (ERCP);  Surgeon: Beryle Beams, MD;  Location: [facility] ENDOSCOPY;  Service: Endoscopy;  Laterality: N/A;  . Laparotomy  01/03/2012    Procedure: EXPLORATORY LAPAROTOMY;  Surgeon: Pedro Earls, MD;  Location: [facility] ORS;  Service: General;  Laterality: N/A;  debridement of necrotic pancreas and placement of drains x2  . Esophagogastroduodenoscopy  01/06/2012    Procedure: ESOPHAGOGASTRODUODENOSCOPY (EGD);  Surgeon: Wonda Horner, MD;  Location: [facility] ENDOSCOPY;  Service: Endoscopy;  Laterality: N/A;  bedside   Family History:  Family History  Problem Relation Age of Onset  . Stroke Mother   . Heart disease Father   . Heart failure Father   . Heart disease Sister    Social History:  History  Alcohol Use  . 1.5 - 2.0 oz/week  . 3-4 Standard drinks or equivalent per week    Comment: has been binge drinking since the weekend.     History  Drug Use No    Comment: THC tiny bowls on week-ends, last use 08/15/14    History   Social History  . Marital Status: Divorced    Spouse Name: N/A  . Number of Children: N/A  . Years of Education: N/A   Social History Main Topics  . Smoking status: Light Tobacco Smoker -- 0.25 packs/day for 15 years  . Smokeless tobacco: Never Used  .  Alcohol Use: 1.5 - 2.0 oz/week    3-4 Standard drinks or equivalent per week     Comment: has been binge drinking since the weekend.  . Drug Use: No     Comment: THC tiny bowls on week-ends, last use 08/15/14  . 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   Other Topics Concern  . None   Social History Narrative   Has a son and some friends with whom she is close. Was in rehab in 09/2010 and was sober for a few months after.    Additional History:    Sleep: Fair  Appetite:   Fair   Assessment: Doing better. Not shaky. Blood pressure a little elevated. No suicidal ideation. He can be taken off of Ativan in anticipation of discharge hopefully within the next day.  Musculoskeletal: Strength & Muscle Tone: within normal limits Gait & Station: normal Patient leans: N/A   Psychiatric Specialty Exam: Physical Exam  Nursing note and vitals reviewed. Constitutional: She appears well-developed and well-nourished.  HENT:  Head: Normocephalic and atraumatic.  Eyes: Conjunctivae are normal. Pupils are equal, round, and reactive to light.  Neck: Normal range of motion.  Cardiovascular: Normal heart sounds.   Respiratory: Effort normal.  GI: Soft.  Musculoskeletal: Normal range of motion.  Neurological: She is alert.  Skin: Skin is warm and dry.  Psychiatric: She has a normal mood and affect. Her speech is normal and behavior is normal. Judgment and thought content normal. Cognition and memory are normal.    Review of Systems  Constitutional: Negative.   HENT: Negative.   Eyes: Negative.   Respiratory: Negative.   Cardiovascular: Negative.   Gastrointestinal: Negative.   Musculoskeletal: Negative.   Skin: Negative.   Neurological: Negative.   Psychiatric/Behavioral: Positive for substance abuse. Negative for depression, suicidal ideas and hallucinations. The patient has insomnia. The patient is not nervous/anxious.     Blood pressure 127/70, pulse 70, temperature 97.7 F (36.5 C), temperature source Oral, resp. rate 20, height '5\' 9"'$  (1.753 m), weight 68.493 kg (151 lb), last menstrual period 12/08/2006, SpO2 99 %.Body mass index is 22.29 kg/(m^2).  General Appearance: Casual  Eye Contact::  Good  Speech:  Clear and Coherent  Volume:  Normal  Mood:  Euthymic  Affect:  Congruent  Thought Process:  Goal Directed  Orientation:  Full (Time, Place, and Person)  Thought Content:  Negative  Suicidal Thoughts:  No  Homicidal Thoughts:  No  Memory:  Immediate;    Good Recent;   Fair Remote;   Fair  Judgement:  Intact  Insight:  Fair  Psychomotor Activity:  Normal  Concentration:  Good  Recall:  [place] of Knowledge:Fair  Language: Good  Akathisia:  No  Handed:  Right  AIMS (if indicated):     Assets:  Communication Skills Housing Social Support  ADL's:  Intact  Cognition: WNL  Sleep:  Number of Hours: 4.75     Current Medications: Current Facility-Administered Medications  Medication Dose Route Frequency Provider Last Rate Last Dose  . acetaminophen (TYLENOL) tablet 650 mg  650 mg Oral Q6H PRN Gonzella Lex, MD   650 mg at 11/12/14 1401  . alum & mag hydroxide-simeth (MAALOX/MYLANTA) 200-200-20 MG/5ML suspension 30 mL  30 mL Oral Q4H PRN Gonzella Lex, MD      . FLUoxetine (PROZAC) capsule 40 mg  40 mg Oral Daily Gonzella Lex, MD   40  mg at 11/12/14 0925  . gabapentin (NEURONTIN) capsule 100 mg  100 mg Oral Q breakfast Gonzella Lex, MD   100 mg at 11/12/14 0926  . gabapentin (NEURONTIN) capsule 300 mg  300 mg Oral QHS Gonzella Lex, MD   300 mg at 11/11/14 2200  . gabapentin (NEURONTIN) capsule 400 mg  400 mg Oral QHS Gonzella Lex, MD   400 mg at 11/11/14 2109  . lipase/protease/amylase (CREON) capsule 12,000 Units  12,000 Units Oral TID WC PRN Gonzella Lex, MD      . LORazepam (ATIVAN) tablet 0-4 mg  0-4 mg Oral Q12H Gonzella Lex, MD   0 mg at 11/10/14 1801  . magnesium hydroxide (MILK OF MAGNESIA) suspension 30 mL  30 mL Oral Daily PRN Gonzella Lex, MD      . nicotine (NICODERM CQ - dosed in mg/24 hours) patch 21 mg  21 mg Transdermal Daily Gonzella Lex, MD   21 mg at 11/12/14 0926  . nicotine (NICOTROL) 10 MG inhaler 1 continuous puffing  1 continuous puffing Inhalation PRN Gonzella Lex, MD      . promethazine (PHENERGAN) tablet 12.5 mg  12.5 mg Oral Q6H PRN Gonzella Lex, MD      . QUEtiapine (SEROQUEL) tablet 25-50 mg  25-50 mg Oral QHS PRN Gonzella Lex, MD   50 mg at 11/11/14 2108  . thiamine (VITAMIN  B-1) tablet 100 mg  100 mg Oral Daily Dewain Penning, MD       Or  . thiamine (B-1) injection 100 mg  100 mg Intravenous Daily Dewain Penning, MD   100 mg at 11/12/14 1000    Lab Results:  No results found for this or any previous visit (from the past 48 hour(s)).  Physical Findings: AIMS: Facial and Oral Movements Muscles of Facial Expression: None, normal Lips and Perioral Area: None, normal Jaw: None, normal Tongue: None, normal,Extremity Movements Upper (arms, wrists, hands, fingers): None, normal Lower (legs, knees, ankles, toes): None, normal, Trunk Movements Neck, shoulders, hips: None, normal, Overall Severity Severity of abnormal movements (highest score from questions above): None, normal Incapacitation due to abnormal movements: None, normal Patient's awareness of abnormal movements (rate only patient's report): No Awareness, Dental Status Current problems with teeth and/or dentures?: No Does patient usually wear dentures?: No  CIWA:  CIWA-Ar Total: 0 COWS:     Treatment Plan Summary: Continue current treatment. Pt is encouraged to attend Team Meetings and will discuss about discharge planning and pt was re-assured about the same.   Medical Decision Making:  Review of Psycho-Social Stressors (1), Order AIMS Test (2), Review of Medication Regimen & Side Effects (2) and Review of New Medication or Change in Dosage (2)     Sheree Lalla K 11/12/2014, 2:19 PM

## 2014-11-12 NOTE — BHH Group Notes (Signed)
[place] LCSW Group Therapy  11/12/2014 3:51 PM  Type of Therapy:  Group Therapy  Participation Level:  Active  Participation Quality:  Appropriate and Attentive  Affect:  Appropriate  Cognitive:  Alert, Appropriate and Oriented  Insight:  Engaged  Engagement in Therapy:  Engaged  Modes of Intervention:  Socialization and Support  Summary of Progress/Problems: Patient attended and participated in group discussion appropriately. Patient shared that if she were stranded on an [place], the only thing she would take is "a book on how to survive".   Keene Breath, MSW, LCSWA 11/12/2014, 3:51 PM

## 2014-11-12 NOTE — Tx Team (Signed)
Initial Interdisciplinary Treatment Plan   PATIENT STRESSORS: Substance abuse   PATIENT STRENGTHS: Ability for insight Average or above average intelligence Capable of independent living   PROBLEM LIST: Problem List/Patient Goals Date to be addressed Date deferred Reason deferred Estimated date of resolution  anxiety      "I want to stop drinking"                                                 DISCHARGE CRITERIA:  Improved stabilization in mood, thinking, and/or behavior  PRELIMINARY DISCHARGE PLAN: Return to previous living arrangement  PATIENT/FAMIILY INVOLVEMENT: This treatment plan has been presented to and reviewed with the patient, [name], and/or family member, .  The patient and family have been given the opportunity to ask questions and make suggestions.  Rasmus Preusser B 11/12/2014, 5:25 PM

## 2014-11-12 NOTE — Progress Notes (Signed)
D: Patient denies SI/HI/AVH.  Patient affect and mood are anxious.  Patient was labile with episodes of crying.   Patient did NOT attend evening group. Patient isolative.  Patient interaction with staff was aggressive.  No distress noted. A: Support and encouragement offered. Scheduled medications given to pt. Q 15 min checks continued for patient safety. R: Patient receptive. Patient remains safe on the unit.

## 2014-11-12 NOTE — Plan of Care (Signed)
Problem: Alteration in mood Goal: LTG-Patient reports reduction in suicidal thoughts (Patient reports reduction in suicidal thoughts and is able to verbalize a safety plan for whenever patient is feeling suicidal)  Outcome: Progressing Denies SI     

## 2014-11-12 NOTE — Progress Notes (Signed)
Recreation Therapy Notes  INPATIENT RECREATION THERAPY ASSESSMENT  Patient Details Name: [name] MRN: [id number] DOB: [birth year] Today's Date: 11/12/2014  Patient Stressors: Family, Death, Other (Comment) ("Being in places like this can be stressful.")  Coping Skills:   Isolate, Substance Abuse, Avoidance, Art/Dance, Music  Personal Challenges: Communication, Expressing Yourself, Relationships, Self-Esteem/Confidence, Social Interaction, Substance Abuse, Trusting Others  Leisure Interests (2+):  Individual - Other (Comment) (Go to the beach, be with her kittens)  Awareness of Community Resources:  Yes  Community Resources:  Park  Current Use: Yes  If no, Barriers?:    Patient Strengths:  Intelligence, willingness to give to others  Patient Identified Areas of Improvement:  Relationships, emotions-depression, sadness, hopelessness  Current Recreation Participation:  Painting, drinking  Patient Goal for Hospitalization:  To leave  [place] of Residence:  [place] of Residence:  [place]   Current [place] (including self-harm):  No (Thoughts when she is drunk)  Current HI:  No  Consent to Intern Participation: N/A   Leonette Monarch, LRT/CTRS 11/12/2014, 2:08 PM

## 2014-11-12 NOTE — Progress Notes (Signed)
Recreation Therapy Notes  Date: 07.25.16 Time: 3:00 pm Location: Craft Room  Group Topic: Self-expression  Goal Area(s) Addresses:  Patient will effectively use art as a means of self-expression. Patient will recognize positive benefit of self-expression. Patient will be able to identify one emotion experienced during group session. Patient will identify use of art/self-expression as a coping skill.  Behavioral Response: Attentive, Interactive  Intervention: Two Faces of Me  Activity: Patients were given a blank face worksheet and instructed to draw a line down the middle. On one side they were instructed to draw or write how they felt when they were admitted to the hospital. On the other side they were instructed to draw or write how they want to feel when they are d/c from the hospital.  Education: LRT educated patients on different forms of self-expression.  Education Outcome: In group clarification offered  Clinical Observations/Feedback: Patient drew two faces. Patient contributed to group discussion.  Leonette Monarch, LRT/CTRS 11/12/2014 4:27 PM

## 2014-11-12 NOTE — BHH Group Notes (Signed)
[place] Group Notes:  (Nursing/MHT/Case Management/Adjunct)  Date:  11/12/2014  Time:  1:41 PM  Type of Therapy:  Psychoeducational Skills  Participation Level:  Active  Participation Quality:  Appropriate, Attentive and Sharing  Affect:  Appropriate  Cognitive:  Alert and Appropriate  Insight:  Appropriate and Good  Engagement in Group:  Engaged  Modes of Intervention:  Discussion, Education and Support  Summary of Progress/Problems:  [name] 11/12/2014, 1:41 PM

## 2014-11-12 NOTE — Progress Notes (Signed)
Denies depression or SI.  Continues to struggle with anxiety.   Verbalizes that she has been reading and practicing deep breathing to help with her anxiety. Became tearful when learned that was not going to get to go home today.  Stays to herself.  Safety maintained.

## 2014-11-13 MED ORDER — QUETIAPINE FUMARATE 25 MG PO TABS: 25.0000 mg | ORAL_TABLET | Freq: Three times a day (TID) | ORAL | Status: DC

## 2014-11-13 MED ORDER — GABAPENTIN 400 MG PO CAPS: 400.0000 mg | ORAL_CAPSULE | Freq: Three times a day (TID) | ORAL | Status: DC

## 2014-11-13 MED ORDER — FLUOXETINE HCL 40 MG PO CAPS: 40.0000 mg | ORAL_CAPSULE | Freq: Every day | ORAL | Status: DC

## 2014-11-13 NOTE — Progress Notes (Signed)
D: Patient denies SI/HI/AVH.  Patient affect is appropriate and her mood is pleasant.  Patient did attend evening group. Patient visible on the milieu. No distress noted. A: Support and encouragement offered. Scheduled medications given to pt. Q 15 min checks continued for patient safety. R: Patient receptive. Patient remains safe on the unit.

## 2014-11-13 NOTE — Plan of Care (Signed)
Problem: [facility] Participation in Recreation Therapeutic Interventions Goal: STG-Patient will demonstrate improved self esteem by identif STG: Self-Esteem - Within 3 treatment sessions, patient will verbalize at least 5 positive affirmation statements in one treatment session to increase self-esteem post d/c.  Outcome: Completed/Met Date Met:  11/13/14 Treatment Session 1; Completed 1 out of 1: At approximately 9:25 am, LRT met with patient in craft room. Patient verbalized 5 positive affirmation statements. Patient reported it felt "wonderful". LRT encouraged patient to continue saying positive affirmation statements.  Leonette Monarch, LRT/CTRS 07.26.16 12:10 pm Goal: STG-Patient will identify at least five coping skills for ** STG: Coping Skills - Within 3 treatment sessions, patient will verbalize at least 5 coping skills for substance abuse in one treatment session to decrease substance abuse post d/c.  Outcome: Completed/Met Date Met:  11/13/14 Treatment Session 1; Completed 1 out of 1: At approximately 9:25 am, LRT met with patient in craft room. Patient verbalized 5 coping skills for substance abuse. LRT educated patient on leisure and why it is important to implement into her schedule. LRT provided patient with blank schedules and tips to help her plan her day and try to avoid using substances. LRT educated patient on healthy support systems.   Leonette Monarch, LRT/CTRS 07.26.16 12:16 pm

## 2014-11-13 NOTE — Progress Notes (Signed)
AVS H&P Discharge Summary faxed to [facility] for hospital follow-up

## 2014-11-13 NOTE — BHH Group Notes (Signed)
[place] Group Notes:  (Nursing/MHT/Case Management/Adjunct)  Date:  11/13/2014  Time:  2:20 PM  Type of Therapy:  Psychoeducational Skills  Participation Level:  Active  Participation Quality:  Appropriate and Supportive  Affect:  Appropriate  Cognitive:  Appropriate  Insight:  Appropriate and Good  Engagement in Group:  Engaged and Supportive  Modes of Intervention:  Support  Summary of Progress/Problems:  Ector Laurel A Sharleen Szczesny 11/13/2014, 2:20 PM

## 2014-11-13 NOTE — BHH Suicide Risk Assessment (Signed)
[facility] Discharge Suicide Risk Assessment   Demographic Factors:  Pt is admitted for depression and suicidal ideas and drinking alcohol again with intoxication  Total Time spent with patient: 45 minutes  Musculoskeletal: Strength & Muscle Tone: within normal limits Gait & Station: normal Patient leans: N/A  Psychiatric Specialty Exam: Physical Exam  Nursing note and vitals reviewed.   ROS  Blood pressure 138/82, pulse 71, temperature 97.8 F (36.6 C), temperature source Oral, resp. rate 20, height '5\' 9"'$  (1.753 m), weight 68.493 kg (151 lb), last menstrual period 12/08/2006, SpO2 99 %.Body mass index is 22.29 kg/(m^2).  General Appearance: Casual  Eye Contact::  Good  Speech:  Clear and Coherent409  Volume:  Normal  Mood:  Euthymic  Affect:  Appropriate  Thought Process:  Coherent  Orientation:  Full (Time, Place, and Person)  Thought Content:  WDL  Suicidal Thoughts:  No  Homicidal Thoughts:  No  Memory:  Immediate;   Fair Recent;   Fair Remote;   Fair adequate.  Judgement:  Intact  Insight:  Present  Psychomotor Activity:  Normal  Concentration:  Fair  Recall:  [place] of Knowledge:Fair  Language: Good  Akathisia:  NA  Handed:  Right  AIMS (if indicated):     Assets:  Communication Skills Desire for Improvement Financial Resources/Insurance Housing Social Support Transportation Vocational/Educational  Sleep:  Number of Hours: 4.75  Cognition: WNL  ADL's:  Intact   Have you used any form of tobacco in the last 30 days? (Cigarettes, Smokeless Tobacco, Cigars, and/or Pipes): Yes  Has this patient used any form of tobacco in the last 30 days? (Cigarettes, Smokeless Tobacco, Cigars, and/or Pipes) Yes, A prescription for an FDA-approved tobacco cessation medication was offered at discharge and the patient refused  Mental Status Per Nursing Assessment::   On Admission:  Suicide plan  Current Mental Status by Physician: Alert and ox3. Calm, pleasent and  co-operative. No agitation. affect is neutral and mood is stable . No psychosis. Denies s/h ideas or plans and contracts for safety and wants to get help to quit drinking alcohol.  Loss Factors: NA  Historical Factors: Alcohol drinking.  Risk Reduction Factors:   Positive coping skills or problem solving skills  Continued Clinical Symptoms:  Previous Psychiatric Diagnoses and Treatments  Cognitive Features That Contribute To Risk:  None    Suicide Risk:  Minimal: No identifiable suicidal ideation.  Patients presenting with no risk factors but with morbid ruminations; may be classified as minimal risk based on the severity of the depressive symptoms  Principal Problem: Major depression Discharge Diagnoses:  Patient Active Problem List   Diagnosis Date Noted  . Major depressive disorder, recurrent, severe without psychotic features [F33.2]   . Major depression [F32.2] 11/10/2014  . Substance induced mood disorder [F19.94] 11/06/2014  . Bilateral knee pain [M25.561, M25.562] 10/14/2014  . Alcohol use disorder, severe, dependence [F10.20] 08/17/2014  . Alcohol abuse [F10.10]   . Suicidal behavior [F48.9]   . Wrist laceration [S61.519A]   . Alcohol dependence [F10.20] 10/02/2013  . Alcohol withdrawal [F10.239] 10/02/2013  . Major depression, recurrent [F33.9] 10/02/2013  . Gastric ulcer [K25.9] 01/20/2012  . Fatty liver [K76.0] 10/11/2011  . 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 Elevated liver function tests [R79.89]   . GERD (gastroesophageal reflux disease) [K21.9]   . 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 HIATAL HERNIA [K44.9] 10/01/2008  . FATTY LIVER DISEASE [K76.89] 10/01/2008    Follow-up Information    Follow up with  [facility]  On 01/08/2015.   Why:  You have an appointment with your psychiatrist on Tuesday, September 20th at 11:30am. Please call to confirm appointment.    Contact information:   [address] [place], [place]  [address] Phone: [phone]      Plan Of Care/Follow-up recommendations:  Other:  Discussed complaince issues and Sober from alcohol and take help for the same adn keep follow up appts in the Community.  Is patient on multiple antipsychotic therapies at discharge:  No   Has Patient had three or more failed trials of antipsychotic monotherapy by history:  No  Recommended Plan for Multiple Antipsychotic Therapies: NA    Chonita Gadea K 11/13/2014, 2:14 PM

## 2014-11-13 NOTE — Discharge Summary (Signed)
Physician Discharge Summary Note  Patient:  [name] is an 59 y.o., female MRN:  [id number] DOB:  Mar 24, 1956 Patient phone:  [phone] (home)  Patient address:   [address] [place] [address],  Total Time spent with patient: 45 minutes  Date of Admission:  11/10/2014 Date of Discharge: 11/13/2014  Reason for Admission:  Depressed with SI and alcohol drinking.  Principal Problem: Major depression Discharge Diagnoses: Patient Active Problem List   Diagnosis Date Noted  . Major depressive disorder, recurrent, severe without psychotic features [F33.2]   . Major depression [F32.2] 11/10/2014  . Substance induced mood disorder [F19.94] 11/06/2014  . Bilateral knee pain [M25.561, M25.562] 10/14/2014  . Alcohol use disorder, severe, dependence [F10.20] 08/17/2014  . Alcohol abuse [F10.10]   . Suicidal behavior [F48.9]   . Wrist laceration [S61.519A]   . Alcohol dependence [F10.20] 10/02/2013  . Alcohol withdrawal [F10.239] 10/02/2013  . Major depression, recurrent [F33.9] 10/02/2013  . Gastric ulcer [K25.9] 01/20/2012  . Fatty liver [K76.0] 10/11/2011  . 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 Elevated liver function tests [R79.89]   . GERD (gastroesophageal reflux disease) [K21.9]   . 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 HIATAL HERNIA [K44.9] 10/01/2008  . FATTY LIVER DISEASE [K76.89] 10/01/2008    Musculoskeletal: Strength & Muscle Tone: within normal limits Gait & Station: normal Patient leans: N/A  Psychiatric Specialty Exam: Physical Exam  Nursing note and vitals reviewed.   ROS  Blood pressure 138/82, pulse 71, temperature 97.8 F (36.6 C), temperature source Oral, resp. rate 20, height '5\' 9"'$  (1.753 m), weight 68.493 kg (151 lb), last menstrual period 12/08/2006, SpO2 99 %.Body mass index is 22.29 kg/(m^2).   Have you used any form of tobacco in the last 30 days? (Cigarettes, Smokeless Tobacco, Cigars, and/or Pipes): Yes  Has this  patient used any form of tobacco in the last 30 days? (Cigarettes, Smokeless Tobacco, Cigars, and/or Pipes) Yes, A prescription for an FDA-approved tobacco cessation medication was offered at discharge and the patient refused Please review SRA Past Medical History:  Past Medical History  Diagnosis Date  . GERD (gastroesophageal reflux disease)   . Rosacea   . Elevated liver function tests   . Wears glasses   . Chronic diarrhea   . Alcohol abuse     H/o withdrawal   . PONV (postoperative nausea and vomiting)   . Pancreatitis   . Depression   . Anxiety     Past Surgical History  Procedure Laterality Date  . Cholecystectomy    . Shoulder surgery  [id number]  . Diagnostic mammogram  2008  . Tubal ligation    . Colonoscopy  Never  . Esophagogastroduodenoscopy  03/21/2011    Procedure: ESOPHAGOGASTRODUODENOSCOPY (EGD);  Surgeon: Scarlette Shorts, MD;  Location[facility] ENDOSCOPY;  Service: Endoscopy;  Laterality: N/A;  Patient may need to be done at bedside tomorrow depending on status  . Ercp  10/06/2011    Procedure: ENDOSCOPIC RETROGRADE CHOLANGIOPANCREATOGRAPHY (ERCP);  Surgeon: Beryle Beams, MD;  Location: [facility] ENDOSCOPY;  Service: Endoscopy;  Laterality: N/A;  . Laparotomy  01/03/2012    Procedure: EXPLORATORY LAPAROTOMY;  Surgeon: Pedro Earls, MD;  Location: [facility] ORS;  Service: General;  Laterality: N/A;  debridement of necrotic pancreas and placement of drains x2  . Esophagogastroduodenoscopy  01/06/2012    Procedure: ESOPHAGOGASTRODUODENOSCOPY (EGD);  Surgeon: Wonda Horner, MD;  Location: [facility] ENDOSCOPY;  Service: Endoscopy;  Laterality: N/A;  bedside   Family History:  Family History  Problem  Relation Age of Onset  . Stroke Mother   . Heart disease Father   . Heart failure Father   . Heart disease Sister    Social History:  History  Alcohol Use  . 1.5 - 2.0 oz/week  . 3-4 Standard drinks or equivalent per week    Comment: has been binge drinking since the weekend.     History   Drug Use No    Comment: THC tiny bowls on week-ends, last use 08/15/14    History   Social History  . Marital Status: Divorced    Spouse Name: N/A  . Number of Children: N/A  . Years of Education: N/A   Social History Main Topics  . Smoking status: Light Tobacco Smoker -- 0.25 packs/day for 15 years  . Smokeless tobacco: Never Used  . Alcohol Use: 1.5 - 2.0 oz/week    3-4 Standard drinks or equivalent per week     Comment: has been binge drinking since the weekend.  . Drug Use: No     Comment: THC tiny bowls on week-ends, last use 08/15/14  . 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   Other Topics Concern  . None   Social History Narrative   Has a son and some friends with whom she is close. Was in rehab in 09/2010 and was sober for a few months after.     Past Psychiatric History: Hospitalizations:  Outpatient Care:  Substance Abuse Care:  Self-Mutilation:  Suicidal Attempts:  Violent Behaviors:   Risk to Self: Is patient at risk for suicide?: Yes Risk to Others:   Prior Inpatient Therapy:   Prior Outpatient Therapy:    Level of Care:  OP  Hospital Course:  Improved.  Consults:  None  Significant Diagnostic Studies:  None  Discharge Vitals:   Blood pressure 138/82, pulse 71, temperature 97.8 F (36.6 C), temperature source Oral, resp. rate 20, height '5\' 9"'$  (1.753 m), weight 68.493 kg (151 lb), last menstrual period 12/08/2006, SpO2 99 %. Body mass index is 22.29 kg/(m^2). Lab Results:   No results found for this or any previous visit (from the past 72 hour(s)).  Physical Findings: AIMS: Facial and Oral Movements Muscles of Facial Expression: None, normal Lips and Perioral Area: None, normal Jaw: None, normal Tongue: None, normal,Extremity Movements Upper (arms, wrists, hands, fingers): None, normal Lower (legs, knees, ankles, toes): None, normal, Trunk Movements Neck, shoulders, hips: None, normal, Overall  Severity Severity of abnormal movements (highest score from questions above): None, normal Incapacitation due to abnormal movements: None, normal Patient's awareness of abnormal movements (rate only patient's report): No Awareness, Dental Status Current problems with teeth and/or dentures?: No Does patient usually wear dentures?: No  CIWA:  CIWA-Ar Total: 0 COWS:      See Psychiatric Specialty Exam and Suicide Risk Assessment completed by Attending Physician prior to discharge.  Discharge destination:  Home  Is patient on multiple antipsychotic therapies at discharge:  No   Has Patient had three or more failed trials of antipsychotic monotherapy by history:  No    Recommended Plan for Multiple Antipsychotic Therapies: NA  Discharge Instructions    Diet - low sodium heart healthy    Complete by:  As directed      Increase activity slowly    Complete by:  As directed             Medication List    STOP taking these  medications        traZODone 100 MG tablet  Commonly known as:  DESYREL      TAKE these medications      Indication   FLUoxetine 40 MG capsule  Commonly known as:  PROZAC  Take 1 capsule (40 mg total) by mouth daily.      gabapentin 400 MG capsule  Commonly known as:  NEURONTIN  Take 1 capsule (400 mg total) by mouth 3 (three) times daily.      naltrexone 50 MG tablet  Commonly known as:  DEPADE  Take 25 mg by mouth at bedtime.      QUEtiapine 25 MG tablet  Commonly known as:  SEROQUEL  Take 1 tablet (25 mg total) by mouth 3 (three) times daily.            Follow-up Information    Follow up with [facility]  On 01/08/2015.   Why:  You have an appointment with your psychiatrist on Tuesday, September 20th at 11:30am. Please call to confirm appointment.    Contact information:   [address] [place], [place] [address] Phone: [phone]      Follow-up recommendations:  Other:  Will keep up her follow up appts in the Community with [facility]  and [facility] in [place].  Comments:  As above  Total Discharge Time: 71mns  Signed: CDewain Penning7/26/2016, 2:25 PM

## 2014-11-13 NOTE — [facility] Group Notes (Signed)
[name] LCSW Aftercare Discharge Planning Group Note  11/13/2014 7:54 AM  Participation Quality:  Attentive  Affect:  Appropriate  Cognitive:  Appropriate  Insight:  Engaged  Engagement in Group:  Engaged  Modes of Intervention:  Discussion, Education and Support  Summary of Progress/Problems:She would like to return to [facility] groups and deal with her addiction issues and to be discharged  Enis Slipper M 11/13/2014, 7:54 AM

## 2014-11-13 NOTE — Progress Notes (Signed)
Pt discharged home. DC instructions provided and explained. Medications reviewed. Rx given. All questions answered. Denies SI, HI, AVH. No withdrawls noted. Pt stable at discharge

## 2014-11-13 NOTE — Progress Notes (Signed)
Recreation Therapy Notes  INPATIENT RECREATION TR PLAN  Patient Details Name: [name] MRN: [id number] DOB: 06/28/1955 Today's Date: 11/13/2014  Rec Therapy Plan Is patient appropriate for Therapeutic Recreation?: Yes Treatment times per week: At least 3 times a week TR Treatment/Interventions: 1:1 session, Group participation (Comment) (Appropriate participation in daily recreation therapy tx)  Discharge Criteria Pt will be discharged from therapy if:: Discharged Treatment plan/goals/alternatives discussed and agreed upon by:: Patient/family  Discharge Summary Short term goals set: See Care Plan Short term goals met: Complete Progress toward goals comments: One-to-one attended Which groups?: Other (Comment) (Self-expression) One-to-one attended: Self-esteem, coping skills Reason goals not met: N/A Therapeutic equipment acquired: None Reason patient discharged from therapy: Discharge from hospital Pt/family agrees with progress & goals achieved: Yes Date patient discharged from therapy: 11/13/14   , M, LRT/CTRS 11/13/2014, 12:17 PM  

## 2014-11-13 NOTE — Progress Notes (Signed)
  [facility] Adult Case Management Discharge Plan :  Will you be returning to the same living situation after discharge:  Yes,    At discharge, do you have transportation home?: Yes,  car is on hospital campus Do you have the ability to pay for your medications: Yes,  [employer]  Release of information consent forms completed and in the chart;  Patient's signature needed at discharge.  Patient to Follow up at: Follow-up Information    Follow up with [facility]  On 01/08/2015.   Why:  You have an appointment with your psychiatrist on Tuesday, September 20th at 11:30am. Please call to confirm appointment.    Contact information:   [place], [place] [address] Phone: [phone]      Patient denies SI/HI: Yes,       Safety Planning and Suicide Prevention discussed: Yes,     Have you used any form of tobacco in the last 30 days? (Cigarettes, Smokeless Tobacco, Cigars, and/or Pipes): Yes  Has patient been referred to the Quitline?: Patient refused referral   August Saucer, MSW, LCSW 11/13/2014, 11:45 AM

## 2015-01-16 ENCOUNTER — Encounter: Payer: Self-pay | Admitting: Family Medicine

## 2015-01-16 ENCOUNTER — Ambulatory Visit (INDEPENDENT_AMBULATORY_CARE_PROVIDER_SITE_OTHER): Payer: Managed Care, Other (non HMO) | Admitting: Family Medicine

## 2015-01-16 VITALS — BP 120/84 | HR 67 | Temp 98.4°F | Resp 16 | Ht 66.0 in | Wt 167.2 lb

## 2015-01-16 DIAGNOSIS — J302 Other seasonal allergic rhinitis: Secondary | ICD-10-CM

## 2015-01-16 DIAGNOSIS — Z0000 Encounter for general adult medical examination without abnormal findings: Secondary | ICD-10-CM | POA: Diagnosis not present

## 2015-01-16 DIAGNOSIS — R1084 Generalized abdominal pain: Secondary | ICD-10-CM | POA: Diagnosis not present

## 2015-01-16 DIAGNOSIS — M25511 Pain in right shoulder: Secondary | ICD-10-CM | POA: Diagnosis not present

## 2015-01-16 DIAGNOSIS — R631 Polydipsia: Secondary | ICD-10-CM | POA: Diagnosis not present

## 2015-01-16 DIAGNOSIS — Z23 Encounter for immunization: Secondary | ICD-10-CM | POA: Diagnosis not present

## 2015-01-16 LAB — POCT URINALYSIS DIP (MANUAL ENTRY)
BILIRUBIN UA: NEGATIVE
Bilirubin, UA: NEGATIVE
Blood, UA: NEGATIVE
Glucose, UA: NEGATIVE
LEUKOCYTES UA: NEGATIVE
Nitrite, UA: NEGATIVE
Protein Ur, POC: NEGATIVE
Spec Grav, UA: 1.02
Urobilinogen, UA: 0.2
pH, UA: 5

## 2015-01-16 LAB — POC MICROSCOPIC URINALYSIS (UMFC)

## 2015-01-16 MED ORDER — MELOXICAM 7.5 MG PO TABS: 7.5000 mg | ORAL_TABLET | Freq: Every day | ORAL | Status: DC

## 2015-01-16 MED ORDER — RANITIDINE HCL 150 MG PO TABS: 150.0000 mg | ORAL_TABLET | Freq: Two times a day (BID) | ORAL | Status: DC

## 2015-01-16 MED ORDER — FLUTICASONE PROPIONATE 50 MCG/ACT NA SUSP: 2.0000 | Freq: Every day | NASAL | Status: DC

## 2015-01-16 NOTE — Patient Instructions (Addendum)
Try 1/2 to 1 immodium for diarrhea Walk at least 15 minutes every day      Why follow it? Research shows. . Those who follow the Mediterranean diet have a reduced risk of heart disease  . The diet is associated with a reduced incidence of Parkinson's and Alzheimer's diseases . People following the diet may have longer life expectancies and lower rates of chronic diseases  . The Dietary Guidelines for Americans recommends the Mediterranean diet as an eating plan to promote health and prevent disease  What Is the Mediterranean Diet?  . Healthy eating plan based on typical foods and recipes of Mediterranean-style cooking . The diet is primarily a plant based diet; these foods should make up a majority of meals   Starches - Plant based foods should make up a majority of meals - They are an important sources of vitamins, minerals, energy, antioxidants, and fiber - Choose whole grains, foods high in fiber and minimally processed items  - Typical grain sources include wheat, oats, barley, corn, brown rice, bulgar, farro, millet, polenta, couscous  - Various types of beans include chickpeas, lentils, fava beans, black beans, white beans   Fruits  Veggies - Large quantities of antioxidant rich fruits & veggies; 6 or more servings  - Vegetables can be eaten raw or lightly drizzled with oil and cooked  - Vegetables common to the traditional Mediterranean Diet include: artichokes, arugula, beets, broccoli, brussel sprouts, cabbage, carrots, celery, collard greens, cucumbers, eggplant, kale, leeks, lemons, lettuce, mushrooms, okra, onions, peas, peppers, potatoes, pumpkin, radishes, rutabaga, shallots, spinach, sweet potatoes, turnips, zucchini - Fruits common to the Mediterranean Diet include: apples, apricots, avocados, cherries, clementines, dates, figs, grapefruits, grapes, melons, nectarines, oranges, peaches, pears, pomegranates, strawberries, tangerines  Fats - Replace butter and margarine with  healthy oils, such as olive oil, canola oil, and tahini  - Limit nuts to no more than a handful a day  - Nuts include walnuts, almonds, pecans, pistachios, pine nuts  - Limit or avoid candied, honey roasted or heavily salted nuts - Olives are central to the [employer] - can be eaten whole or used in a variety of dishes   Meats Protein - Limiting red meat: no more than a few times a month - When eating red meat: choose lean cuts and keep the portion to the size of deck of cards - Eggs: approx. 0 to 4 times a week  - Fish and lean poultry: at least 2 a week  - Healthy protein sources include, chicken, [place], lean beef, lamb - Increase intake of seafood such as tuna, salmon, trout, mackerel, shrimp, scallops - Avoid or limit high fat processed meats such as sausage and bacon  Dairy - Include moderate amounts of low fat dairy products  - Focus on healthy dairy such as fat free yogurt, skim milk, low or reduced fat cheese - Limit dairy products higher in fat such as whole or 2% milk, cheese, ice cream  Alcohol - Moderate amounts of red wine is ok  - No more than 5 oz daily for women (all ages) and men older than age 71  - No more than 10 oz of wine daily for men younger than 59  Other - Limit sweets and other desserts  - Use herbs and spices instead of salt to flavor foods  - Herbs and spices common to the traditional Mediterranean Diet include: basil, bay leaves, chives, cloves, cumin, fennel, garlic, lavender, marjoram, mint, oregano, parsley, pepper, rosemary, sage, savory,  sumac, tarragon, thyme   It's not just a diet, it's a lifestyle:  . The Mediterranean diet includes lifestyle factors typical of those in the region  . Foods, drinks and meals are best eaten with others and savored . Daily physical activity is important for overall good health . This could be strenuous exercise like running and aerobics . This could also be more leisurely activities such as walking, housework,  yard-work, or taking the stairs . Moderation is the key; a balanced and healthy diet accommodates most foods and drinks . Consider portion sizes and frequency of consumption of certain foods   Meal Ideas & Options:  . Breakfast:  o Whole wheat toast or whole wheat English muffins with peanut butter & hard boiled egg o Steel cut oats topped with apples & cinnamon and skim milk  o Fresh fruit: banana, strawberries, melon, berries, peaches  o Smoothies: strawberries, bananas, greek yogurt, peanut butter o Low fat greek yogurt with blueberries and granola  o Egg white omelet with spinach and mushrooms o Breakfast couscous: whole wheat couscous, apricots, skim milk, cranberries  . Sandwiches:  o Hummus and grilled vegetables (peppers, zucchini, squash) on whole wheat bread   o Grilled chicken on whole wheat pita with lettuce, tomatoes, cucumbers or tzatziki  o Tuna salad on whole wheat bread: tuna salad made with greek yogurt, olives, red peppers, capers, green onions o Garlic rosemary lamb pita: lamb sauted with garlic, rosemary, salt & pepper; add lettuce, cucumber, greek yogurt to pita - flavor with lemon juice and black pepper  . Seafood:  o Mediterranean grilled salmon, seasoned with garlic, basil, parsley, lemon juice and black pepper o Shrimp, lemon, and spinach whole-grain pasta salad made with low fat greek yogurt  o Seared scallops with lemon orzo  o Seared tuna steaks seasoned salt, pepper, coriander topped with tomato mixture of olives, tomatoes, olive oil, minced garlic, parsley, green onions and cappers  . Meats:  o Herbed greek chicken salad with kalamata olives, cucumber, feta  o Red bell peppers stuffed with spinach, bulgur, lean ground beef (or lentils) & topped with feta   o Kebabs: skewers of chicken, tomatoes, onions, zucchini, squash  o [place] burgers: made with red onions, mint, dill, lemon juice, feta cheese topped with roasted red peppers . Vegetarian o Cucumber  salad: cucumbers, artichoke hearts, celery, red onion, feta cheese, tossed in olive oil & lemon juice  o Hummus and whole grain pita points with a greek salad (lettuce, tomato, feta, olives, cucumbers, red onion) o Lentil soup with celery, carrots made with vegetable broth, garlic, salt and pepper  o Tabouli salad: parsley, bulgur, mint, scallions, cucumbers, tomato, radishes, lemon juice, olive oil, salt and pepper.

## 2015-01-16 NOTE — Progress Notes (Signed)
Subjective:    Patient ID: [name], female    DOB: [birth year], 59 y.o.   MRN: [id number]  HPI This is a pleasant 59 yo female who presents today for CPE.  Patient has had struggles with addiction and was in the hospital in 7/16 following binge drinking. She is currently seeing a psychiatrist, therapist and goes to group therapy 3x/ week, she finds this helpful. She is struggling with her medication, finding that the seroquel makes her feel more tired and low. Sees Dr. Merlinda Frederick at [facility]. Is currently on short term disability. She is scheduled to return to work 02/04/15. She is a little concerned about returning to her previous work environment because she found it hostile after she was previously out on a leave. She may not be returning to the same work group.   Has right shoulder pain and is taking ibuprofen 400 mg 6x day. Pain is achy and constant, pain gets worse daily. Has not tried ice or heat.   Has frequent abdominal pain with daily diarrhea- 3-4 stools daily. Feels nauseous frequently. Has tried a couple doses of omeprazole which made her feel sick so she stopped taking it. Has history of gallbladder surgery, pancreatitis. Does not think any GI practice will see her in [place] due to her previous inability to pay.   Last CPE- sees gyn annually Mammo- 2015- normal Pap- 2015- gyn, normal Colonoscopy- never Tdap- 2013 Flu- today Eye- annual Dental- regular Exercise- not regular  Past Medical History  Diagnosis Date  . GERD (gastroesophageal reflux disease)   . Rosacea   . Elevated liver function tests   . Wears glasses   . Chronic diarrhea   . Alcohol abuse     H/o withdrawal   . PONV (postoperative nausea and vomiting)   . Pancreatitis   . Depression   . Anxiety    Past Surgical History  Procedure Laterality Date  . Cholecystectomy    . Shoulder surgery  [id number]  . Diagnostic mammogram  2008  . Tubal ligation    . Colonoscopy  Never  .  Esophagogastroduodenoscopy  03/21/2011    Procedure: ESOPHAGOGASTRODUODENOSCOPY (EGD);  Surgeon: Scarlette Shorts, MD;  Location: [facility] ENDOSCOPY;  Service: Endoscopy;  Laterality: N/A;  Patient may need to be done at bedside tomorrow depending on status  . Ercp  10/06/2011    Procedure: ENDOSCOPIC RETROGRADE CHOLANGIOPANCREATOGRAPHY (ERCP);  Surgeon: Beryle Beams, MD;  Location: [facility] END[facility]COPY;  Service: Endoscopy;  Laterality: N/A;  . Laparotomy  01/03/2012    Procedure: EXPLORATORY LAPAROTOMY;  Surgeon: Pedro Earls, MD;  Location: [facility] ORS;  Service: General;  Laterality: N/A;  debridement of necrotic pancreas and placement of drains x2  . Esophagogastroduodenoscopy  01/06/2012    Procedure: ESOPHAGOGASTRODUODENOSCOPY (EGD);  Surgeon: Wonda Horner, MD;  Location: [facility] ENDOSCOPY;  Service: Endoscopy;  Laterality: N/A;  bedside   Family History  Problem Relation Age of Onset  . Stroke Mother   . Heart disease Father   . Heart failure Father   . Heart disease Sister    Social History  Substance Use Topics  . Smoking status: Light Tobacco Smoker -- 0.25 packs/day for 15 years  . Smokeless tobacco: Never Used  . Alcohol Use: 1.5 - 2.0 oz/week    3-4 Standard drinks or equivalent per week     Comment: has been binge drinking since the weekend.    Review of Systems  Constitutional: Positive for diaphoresis.  HENT: Positive for congestion and postnasal  drip.   Eyes: Positive for photophobia and itching.  Respiratory: Positive for cough and wheezing.   Cardiovascular: Negative.   Gastrointestinal: Positive for nausea, abdominal pain and diarrhea.  Endocrine: Positive for heat intolerance and polydipsia.  Genitourinary: Negative.   Musculoskeletal: Positive for arthralgias.  Skin: Negative.   Allergic/Immunologic: Positive for environmental allergies.  Neurological: Positive for weakness.  Hematological: Bruises/bleeds easily.  Psychiatric/Behavioral: Positive for behavioral problems, sleep  disturbance, dysphoric mood and decreased concentration.       PTSD diagnosis       Objective:   Physical Exam Physical Exam  Constitutional: She is oriented to person, place, and time. She appears well-developed and well-nourished. No distress.  HENT:  Head: Normocephalic and atraumatic.  Right Ear: External ear normal.  Left Ear: External ear normal.  Nose: Nose normal.  Mouth/Throat: Oropharynx is clear and moist. No oropharyngeal exudate.  Eyes: Conjunctivae are normal. Pupils are equal, round, and reactive to light.  Neck: Normal range of motion. Neck supple. No JVD present. No thyromegaly present.  Cardiovascular: Normal rate, regular rhythm, normal heart sounds and intact distal pulses.   Pulmonary/Chest: Effort normal and breath sounds normal. Right breast exhibits no inverted nipple, no mass, no nipple discharge, no skin change and no tenderness. Left breast exhibits no inverted nipple, no mass, no nipple discharge, no skin change and no tenderness. Breasts are symmetrical.  Abdominal: Soft. Bowel sounds are normal. She exhibits no distension and no mass. There is no tenderness. There is no rebound and no guarding.  Musculoskeletal: Decreased range of motion right shoulder. She is not tender to palpation of right shoulder. Neck with good ROM and no point tenderness.  Lymphadenopathy:    She has no cervical adenopathy.  Neurological: She is alert and oriented to person, place, and time. She has normal reflexes.  Skin: Skin is warm and dry. She is not diaphoretic.  Psychiatric: She has a normal mood and affect. Her behavior is normal. Judgment and thought content normal.  Vitals reviewed.  BP 120/84 mmHg  Pulse 67  Temp(Src) 98.4 F (36.9 C) (Oral)  Resp 16  Ht '5\' 6"'$  (1.676 m)  Wt 167 lb 3.2 oz (75.841 kg)  BMI 27.00 kg/m2  SpO2 98%  LMP 12/08/2006 Wt Readings from Last 3 Encounters:  01/16/15 167 lb 3.2 oz (75.841 kg)  11/10/14 151 lb (68.493 kg)  11/08/14 160 lb  (72.576 kg)   Depression screen [name] 2/9 01/16/2015 10/05/2014 09/21/2014 09/21/2014 09/21/2014  Decreased Interest 2 0 0 0 0  Down, Depressed, Hopeless 3 0 1 1 0  PHQ - 2 Score 5 0 1 1 0  Altered sleeping 3 - 3 3 -  Tired, decreased energy 3 - 1 1 -  Change in appetite 3 - 2 - -  Feeling bad or failure about yourself  2 - 2 2 -  Trouble concentrating 2 - 1 2 -  Moving slowly or fidgety/restless 1 - 2 - -  Suicidal thoughts 1 - 1 - -  PHQ-9 Score 20 - 13 9 -  Difficult doing work/chores Somewhat difficult - - - -      Assessment & Plan:  1. Annual physical exam - Reinforced need for regular routine, daily exercise, encouraged complete abstinence from ETOH and smoking cessation. Discussed importance of regular sleep.   2. Flu vaccine need - Flu Vaccine QUAD 36+ mos IM  3. Pain in joint, shoulder region, right - meloxicam (MOBIC) 7.5 MG tablet; Take 1 tablet (7.5 mg  total) by mouth daily.  Dispense: 30 tablet; Refill: 1 - she was instructed to avoid other NSAIDs while taking meloxicam - Ambulatory referral to Orthopedic Surgery  4. Generalized abdominal pain - this is a longstanding problem for the patient, possibly exacerbated by recent use of high doses of NSAIDs - she is reluctant to try PPI since she had previous side effects, will try H2 blocker - ranitidine (ZANTAC) 150 MG tablet; Take 1 tablet (150 mg total) by mouth 2 (two) times daily.  Dispense: 60 tablet; Refill: 1  5. Other seasonal allergic rhinitis - fluticasone (FLONASE) 50 MCG/ACT nasal spray; Place 2 sprays into both nostrils daily.  Dispense: 16 g; Refill: 6  6. Polydipsia - urine dip/micro unremarkable today - POCT urinalysis dipstick - POCT Microscopic Urinalysis ([facility])  - follow up in 3 months  Clarene Reamer, FNP-BC  [facility] [place] [facility]  01/19/2015 7:49 AM

## 2015-01-19 ENCOUNTER — Encounter: Payer: Self-pay | Admitting: Family Medicine

## 2015-02-10 ENCOUNTER — Telehealth: Payer: Self-pay | Admitting: Family Medicine

## 2015-02-10 NOTE — Telephone Encounter (Signed)
lmom of pt new appt time on 04/10/15 at 8:15

## 2015-04-10 ENCOUNTER — Ambulatory Visit (INDEPENDENT_AMBULATORY_CARE_PROVIDER_SITE_OTHER): Payer: Managed Care, Other (non HMO) | Admitting: Family Medicine

## 2015-04-10 ENCOUNTER — Encounter: Payer: Self-pay | Admitting: Family Medicine

## 2015-04-10 VITALS — BP 128/72 | HR 82 | Temp 97.0°F | Resp 16 | Ht 66.0 in | Wt 166.6 lb

## 2015-04-10 DIAGNOSIS — Z23 Encounter for immunization: Secondary | ICD-10-CM | POA: Diagnosis not present

## 2015-04-10 DIAGNOSIS — R059 Cough, unspecified: Secondary | ICD-10-CM

## 2015-04-10 DIAGNOSIS — R05 Cough: Secondary | ICD-10-CM | POA: Diagnosis not present

## 2015-04-10 DIAGNOSIS — Z1211 Encounter for screening for malignant neoplasm of colon: Secondary | ICD-10-CM | POA: Diagnosis not present

## 2015-04-10 DIAGNOSIS — B009 Herpesviral infection, unspecified: Secondary | ICD-10-CM | POA: Diagnosis not present

## 2015-04-10 DIAGNOSIS — J069 Acute upper respiratory infection, unspecified: Secondary | ICD-10-CM

## 2015-04-10 MED ORDER — AZITHROMYCIN 250 MG PO TABS: ORAL_TABLET | ORAL | Status: DC

## 2015-04-10 MED ORDER — VALACYCLOVIR HCL 1 G PO TABS: 1000.0000 mg | ORAL_TABLET | Freq: Two times a day (BID) | ORAL | Status: DC

## 2015-04-10 MED ORDER — BENZONATATE 100 MG PO CAPS
100.0000 mg | ORAL_CAPSULE | Freq: Three times a day (TID) | ORAL | Status: DC | PRN
Start: 2015-04-10 — End: 2015-10-08

## 2015-04-10 MED ORDER — ZOSTER VACCINE LIVE 19400 UNT/0.65ML ~~LOC~~ SOLR: 0.6500 mL | Freq: Once | SUBCUTANEOUS | Status: DC

## 2015-04-10 NOTE — Progress Notes (Signed)
Subjective:    Patient ID: [name], female    DOB: [birth year], 59 y.o.   MRN: [id number]  HPI This is a pleasant female who presents today for follow up of abdominal pain and diarrhea. She continues to have 3-4 loose bowel movements daily. Does not seem to be affected by her diet. Tried immodium which didn't help. She tried some ranitiadine which she take intermittently. She was having daily abdominal pain when seen 3 months ago, the pain has resolved and now she has some urgency with bowel movements. She has a history of pancreatitis and pseudocysts. She was on pancreatic enzymes but did not feel like they helped her diarrhea.  Mood has been good. She finished some intensive therapy over the summer and is back to work and doing well. Is in a good relationship with her boyfriend. Rarely drinks alcohol. Therapist diagnosed her with PTSD and she feels that she has worked through many of her issues. Has stopped her meds.   She has had a cough and nasal congestion for 3 weeks. Taking mucinex, occasionally takes albuterol but doesn't help. Coughs all day and some through the night. Smokes 1 pack a month. Some wheezing at night. No SOB. No recent fever.   She has recurrent pustular rash on lower back, requests valcyclovir. Typically has several outbreaks annually, usually brought on by physical or emotional stress.   Past Medical History  Diagnosis Date  . GERD (gastroesophageal reflux disease)   . Rosacea   . Elevated liver function tests   . Wears glasses   . Chronic diarrhea   . Alcohol abuse     H/o withdrawal   . PONV (postoperative nausea and vomiting)   . Pancreatitis   . Depression   . Anxiety    Past Surgical History  Procedure Laterality Date  . Cholecystectomy    . Shoulder surgery  [id number]  . Diagnostic mammogram  2008  . Tubal ligation    . Colonoscopy  Never  . Esophagogastroduodenoscopy  03/21/2011    Procedure: ESOPHAGOGASTRODUODENOSCOPY (EGD);  Surgeon: Scarlette Shorts, MD;  Location: [facility] ENDOSCOPY;  Service: Endoscopy;  Laterality: N/A;  Patient may need to be done at bedside tomorrow depending on status  . Ercp  10/06/2011    Procedure: ENDOSCOPIC RETROGRADE CHOLANGIOPANCREATOGRAPHY (ERCP);  Surgeon: Beryle Beams, MD;  Location: [facility] ENDOSCOPY;  Service: Endoscopy;  Laterality: N/A;  . Laparotomy  01/03/2012    Procedure: EXPLORATORY LAPAROTOMY;  Surgeon: Pedro Earls, MD;  Location: [facility] ORS;  Service: General;  Laterality: N/A;  debridement of necrotic pancreas and placement of drains x2  . Esophagogastroduodenoscopy  01/06/2012    Procedure: ESOPHAGOGASTRODUODENOSCOPY (EGD);  Surgeon: Wonda Horner, MD;  Location: [facility] ENDOSCOPY;  Service: Endoscopy;  Laterality: N/A;  bedside   Family History  Problem Relation Age of Onset  . Stroke Mother   . Heart disease Father   . Heart failure Father   . Heart disease Sister    Social History  Substance Use Topics  . Smoking status: Light Tobacco Smoker -- 0.25 packs/day for 15 years  . Smokeless tobacco: Never Used  . Alcohol Use: 1.5 - 2.0 oz/week    3-4 Standard drinks or equivalent per week    Review of Systems Per HPI    Objective:   Physical Exam  Constitutional: She is oriented to person, place, and time. She appears well-developed and well-nourished. No distress.  HENT:  Head: Normocephalic.  Right Ear: Tympanic membrane, external ear and  ear canal normal.  Left Ear: Tympanic membrane, external ear and ear canal normal.  Nose: Mucosal edema and rhinorrhea present. Right sinus exhibits no maxillary sinus tenderness and no frontal sinus tenderness. Left sinus exhibits no maxillary sinus tenderness and no frontal sinus tenderness.  Mouth/Throat: Uvula is midline and mucous membranes are normal. Posterior oropharyngeal erythema present. No oropharyngeal exudate or posterior oropharyngeal edema.  Cardiovascular: Normal rate, regular rhythm and normal heart sounds.   Pulmonary/Chest: Effort normal  and breath sounds normal.  Neurological: She is alert and oriented to person, place, and time.  Skin: Skin is warm and dry. Rash (right lumbar back with scattered crusted lesions. ) noted. She is not diaphoretic.  Psychiatric: She has a normal mood and affect. Her behavior is normal. Judgment and thought content normal.  Much brighter affect today.    Vitals reviewed.     BP 128/72 mmHg  Pulse 82  Temp(Src) 97 F (36.1 C) (Oral)  Resp 16  Ht '5\' 6"'$  (1.676 m)  Wt 166 lb 9.6 oz (75.569 kg)  BMI 26.90 kg/m2  SpO2 97%  LMP 12/08/2006 Wt Readings from Last 3 Encounters:  04/10/15 166 lb 9.6 oz (75.569 kg)  01/16/15 167 lb 3.2 oz (75.841 kg)  11/10/14 151 lb (68.493 kg)   Depression screen [facility] 2/9 04/10/2015 01/16/2015 10/05/2014 09/21/2014 09/21/2014  Decreased Interest 0 2 0 0 0  Down, Depressed, Hopeless 0 3 0 1 1  PHQ - 2 Score 0 5 0 1 1  Altered sleeping - 3 - 3 3  Tired, decreased energy - 3 - 1 1  Change in appetite - 3 - 2 -  Feeling bad or failure about yourself  - 2 - 2 2  Trouble concentrating - 2 - 1 2  Moving slowly or fidgety/restless - 1 - 2 -  Suicidal thoughts - 1 - 1 -  PHQ-9 Score - 20 - 13 9  Difficult doing work/chores - Somewhat difficult - - -      Assessment & Plan:  1. Acute upper respiratory infection - azithromycin (ZITHROMAX) 250 MG tablet; Take 2 tablets today then 1 a day until finished  Dispense: 6 tablet; Refill: 0 - encouraged smoking cessation - RTC precautions provided  2. Cough - benzonatate (TESSALON) 100 MG capsule; Take 1-2 capsules (100-200 mg total) by mouth 3 (three) times daily as needed for cough.  Dispense: 40 capsule; Refill: 0  3. Herpes simplex infection - valACYclovir (VALTREX) 1000 MG tablet; Take 1 tablet (1,000 mg total) by mouth 2 (two) times daily.  Dispense: 10 tablet; Refill: 1  4. Special screening for malignant neoplasms, colon - Ambulatory referral to Gastroenterology  5. Need for shingles vaccine - zoster vaccine  live, PF, (ZOSTAVAX) 16553 UNT/0.65ML injection; Inject 19,400 Units into the skin once.  Dispense: 1 each; Refill: 0 2  Clarene Reamer, FNP-BC  [facility] [place] [facility]  04/11/2015 7:06 AM

## 2015-04-10 NOTE — Patient Instructions (Addendum)
   Can use Afrin nasal spray twice a day for up to 3 days.

## 2015-10-08 ENCOUNTER — Encounter: Payer: Self-pay | Admitting: Family Medicine

## 2015-10-08 ENCOUNTER — Ambulatory Visit (INDEPENDENT_AMBULATORY_CARE_PROVIDER_SITE_OTHER): Payer: Managed Care, Other (non HMO) | Admitting: Family Medicine

## 2015-10-08 VITALS — BP 142/78 | HR 79 | Temp 98.4°F | Resp 16 | Ht 66.0 in | Wt 166.4 lb

## 2015-10-08 DIAGNOSIS — Z23 Encounter for immunization: Secondary | ICD-10-CM | POA: Diagnosis not present

## 2015-10-08 DIAGNOSIS — R1011 Right upper quadrant pain: Secondary | ICD-10-CM

## 2015-10-08 DIAGNOSIS — M25562 Pain in left knee: Secondary | ICD-10-CM

## 2015-10-08 DIAGNOSIS — M25561 Pain in right knee: Secondary | ICD-10-CM | POA: Diagnosis not present

## 2015-10-08 MED ORDER — ZOSTER VACCINE LIVE 19400 UNT/0.65ML ~~LOC~~ SUSR: 0.6500 mL | Freq: Once | SUBCUTANEOUS | Status: DC

## 2015-10-08 NOTE — Patient Instructions (Addendum)
  Can take ibuprofen 3 tablets (600 mg) every 8 hours or Alleve 2 tablets every 12 hours  We recommend that you schedule a mammogram for breast cancer screening. Typically, you do not need a referral to do this. Please contact a local imaging center to schedule your mammogram.  [place] [phone]   IF you received an x-ray today, you will receive an invoice from [facility]. Please contact [facility] at [phone] with questions or concerns regarding your invoice.   IF you received labwork today, you will receive an invoice from [employer]. Please contact [facility] at [phone] with questions or concerns regarding your invoice.   Our billing staff will not be able to assist you with questions regarding bills from these companies.  You will be contacted with the lab results as soon as they are available. The fastest way to get your results is to activate your My Chart account. Instructions are located on the last page of this paperwork. If you have not heard from [place] regarding the results in 2 weeks, please contact this office.

## 2015-10-08 NOTE — Progress Notes (Signed)
Subjective:    Patient ID: [name], female    DOB: February 04, 1956, 60 y.o.   MRN: [id number]  HPI This is a pleasant 60 yo female who presents today for follow up of depression, bilateral knee pain.   She saw ortho (Dr. Tamera Punt) in fall for knee pain, she had some injections which helped temporarily. She has had difficulty with her activities, felt some instability. Currently takes ibuprofen 2 tablets every 4 hours as needed. Pain with bending and getting out of chair, pain with going up stairs/hills. Does exercises which sometimes causes more pain. Getting progressively worse with more instability.   Right upper quadrant pain, feels superficial, like a bruise, for about two months. Very small area affected under right breast. Had rash on her back around the same time this pain started. Has not gotten worse, staying about the same. Does not recall feeling flu-like.    Mood has been good. She is enjoying work and staying busy with hobbies.   Past Medical History  Diagnosis Date  . GERD (gastroesophageal reflux disease)   . Rosacea   . Elevated liver function tests   . Wears glasses   . Chronic diarrhea   . Alcohol abuse     H/o withdrawal   . PONV (postoperative nausea and vomiting)   . Pancreatitis   . Depression   . Anxiety    Past Surgical History  Procedure Laterality Date  . Cholecystectomy    . Shoulder surgery  [id number]  . Diagnostic mammogram  2008  . Tubal ligation    . Colonoscopy  Never  . Esophagogastroduodenoscopy  03/21/2011    Procedure: ESOPHAGOGASTRODUODENOSCOPY (EGD);  Surgeon: Scarlette Shorts, MD;  Location: [facility] ENDOSCOPY;  Service: Endoscopy;  Laterality: N/A;  Patient may need to be done at bedside tomorrow depending on status  . Ercp  10/06/2011    Procedure: ENDOSCOPIC RETROGRADE CHOLANGIOPANCREATOGRAPHY (ERCP);  Surgeon: Beryle Beams, MD;  Location: [facility];  Service: Endoscopy;  Laterality: N/A;  . Laparotomy  01/03/2012    Procedure: EXPLORATORY  LAPAROTOMY;  Surgeon: Pedro Earls, MD;  Location: [facility] ORS;  Service: General;  Laterality: N/A;  debridement of necrotic pancreas and placement of drains x2  . Esophagogastroduodenoscopy  01/06/2012    Procedure: ESOPHAGOGASTRODUODENOSCOPY (EGD);  Surgeon: Wonda Horner, MD;  Location: [facility] END[facility]COPY;  Service: Endoscopy;  Laterality: N/A;  bedside   Family History  Problem Relation Age of Onset  . Stroke Mother   . Heart disease Father   . Heart failure Father   . Heart disease Sister    Social History  Substance Use Topics  . Smoking status: Light Tobacco Smoker -- 0.25 packs/day for 15 years  . Smokeless tobacco: Never Used  . Alcohol Use: 1.5 - 2.0 oz/week    3-4 Standard drinks or equivalent per week      Review of Systems Per HPI    Objective:   Physical Exam  Constitutional: She is oriented to person, place, and time. She appears well-developed and well-nourished. No distress.  Cardiovascular: Normal rate, regular rhythm and normal heart sounds.   Pulmonary/Chest: Effort normal and breath sounds normal.  Musculoskeletal: Normal range of motion. She exhibits no edema.       Right knee: She exhibits normal range of motion, no swelling, no effusion and no erythema.  Knee with some laxity.   Neurological: She is alert and oriented to person, place, and time.  Skin: She is not diaphoretic.  Few scattered lesions under  right breast at area of discomfort, look like resolving vesicles. -   Vitals reviewed.     BP 142/78 mmHg  Pulse 79  Temp(Src) 98.4 F (36.9 C) (Oral)  Resp 16  Ht '5\' 6"'$  (1.676 m)  Wt 166 lb 6.4 oz (75.479 kg)  BMI 26.87 kg/m2  LMP 12/08/2006 Wt Readings from Last 3 Encounters:  10/08/15 166 lb 6.4 oz (75.479 kg)  04/10/15 166 lb 9.6 oz (75.569 kg)  01/16/15 167 lb 3.2 oz (75.841 kg)       Assessment & Plan:  1. Need for shingles vaccine - Zoster Vaccine Live, PF, (ZOSTAVAX) 04599 UNT/0.65ML injection; Inject 19,400 Units into the skin once.   Dispense: 1 each; Refill: 0  2. Bilateral knee pain - pain is getting progressively worse and limiting activity, she will make a follow up appointment with Dr. Tamera Punt to discuss further imaging/treatment  3. Abdominal pain, right upper quadrant - this is very superficial and achy, wonder if she had a mild case of shingles  - suggested topical pain relief, if no improvement in 4 weeks RTC, sooner if worsening   Clarene Reamer, FNP-BC  [facility]  10/08/2015 12:42 PM

## 2015-10-09 ENCOUNTER — Ambulatory Visit: Payer: Managed Care, Other (non HMO) | Admitting: Family Medicine

## 2016-01-05 ENCOUNTER — Encounter (HOSPITAL_COMMUNITY): Payer: Self-pay

## 2016-01-05 ENCOUNTER — Emergency Department (HOSPITAL_COMMUNITY)
Admission: EM | Admit: 2016-01-05 | Discharge: 2016-01-05 | Disposition: A | Discharge status: Home / Self Care | Payer: Managed Care, Other (non HMO) | Attending: Emergency Medicine | Admitting: Emergency Medicine

## 2016-01-05 DIAGNOSIS — Z5321 Procedure and treatment not carried out due to patient leaving prior to being seen by health care provider: Secondary | ICD-10-CM | POA: Diagnosis not present

## 2016-01-05 DIAGNOSIS — R1013 Epigastric pain: Secondary | ICD-10-CM | POA: Diagnosis present

## 2016-01-05 DIAGNOSIS — F17200 Nicotine dependence, unspecified, uncomplicated: Secondary | ICD-10-CM | POA: Insufficient documentation

## 2016-01-05 LAB — COMPREHENSIVE METABOLIC PANEL
ALBUMIN: 4.1 g/dL (ref 3.5–5.0)
ALT: 20 U/L (ref 14–54)
ANION GAP: 11 (ref 5–15)
AST: 19 U/L (ref 15–41)
Alkaline Phosphatase: 69 U/L (ref 38–126)
BUN: 9 mg/dL (ref 6–20)
CALCIUM: 9.4 mg/dL (ref 8.9–10.3)
CHLORIDE: 104 mmol/L (ref 101–111)
CO2: 21 mmol/L — AB (ref 22–32)
Creatinine, Ser: 0.73 mg/dL (ref 0.44–1.00)
GFR calc non Af Amer: >60 mL/min (ref >60)
GLUCOSE: 118 mg/dL — AB (ref 65–99)
POTASSIUM: 3.9 mmol/L (ref 3.5–5.1)
SODIUM: 136 mmol/L (ref 135–145)
Total Bilirubin: 0.6 mg/dL (ref 0.3–1.2)
Total Protein: 7.5 g/dL (ref 6.5–8.1)

## 2016-01-05 LAB — CBC
HEMATOCRIT: 46.4 % — AB (ref 36.0–46.0)
HEMOGLOBIN: 15.4 g/dL — AB (ref 12.0–15.0)
MCH: 33.2 pg (ref 26.0–34.0)
MCHC: 33.2 g/dL (ref 30.0–36.0)
MCV: 100 fL (ref 78.0–100.0)
PLATELETS: 293 10*3/uL (ref 150–400)
RBC: 4.64 MIL/uL (ref 3.87–5.11)
RDW: 12 % (ref 11.5–15.5)
WBC: 11.1 10*3/uL — ABNORMAL HIGH (ref 4.0–10.5)

## 2016-01-05 LAB — I-STAT TROPONIN, ED: Troponin i, poc: 0 ng/mL (ref 0.00–0.08)

## 2016-01-05 LAB — LIPASE, BLOOD: LIPASE: 59 U/L — AB (ref 11–51)

## 2016-01-05 NOTE — ED Triage Notes (Signed)
Patient complains of epigastric discomfort x 1 day. This am had chills with thoracic back pain and increased chest/epigastric pain with inspiration. Alert and oriented, NAD

## 2016-02-27 ENCOUNTER — Emergency Department: Payer: Medicaid Other

## 2016-02-27 ENCOUNTER — Emergency Department
Admission: EM | Admit: 2016-02-27 | Discharge: 2016-02-27 | Disposition: A | Discharge status: Home / Self Care | Payer: Medicaid Other | Attending: Student in an Organized Health Care Education/Training Program | Admitting: Student in an Organized Health Care Education/Training Program

## 2016-02-27 ENCOUNTER — Encounter: Payer: Self-pay | Admitting: Emergency Medicine

## 2016-02-27 DIAGNOSIS — Z79899 Other long term (current) drug therapy: Secondary | ICD-10-CM | POA: Insufficient documentation

## 2016-02-27 DIAGNOSIS — F17200 Nicotine dependence, unspecified, uncomplicated: Secondary | ICD-10-CM | POA: Insufficient documentation

## 2016-02-27 DIAGNOSIS — K861 Other chronic pancreatitis: Secondary | ICD-10-CM | POA: Insufficient documentation

## 2016-02-27 DIAGNOSIS — R1013 Epigastric pain: Secondary | ICD-10-CM

## 2016-02-27 LAB — URINALYSIS COMPLETE WITH MICROSCOPIC (ARMC ONLY)
BACTERIA UA: NONE SEEN
Bilirubin Urine: NEGATIVE
GLUCOSE, UA: NEGATIVE mg/dL
HGB URINE DIPSTICK: NEGATIVE
LEUKOCYTES UA: NEGATIVE
Nitrite: NEGATIVE
PH: 6 (ref 5.0–8.0)
PROTEIN: NEGATIVE mg/dL
SPECIFIC GRAVITY, URINE: 1.012 (ref 1.005–1.030)

## 2016-02-27 LAB — COMPREHENSIVE METABOLIC PANEL
ALK PHOS: 72 U/L (ref 38–126)
ALT: 18 U/L (ref 14–54)
AST: 19 U/L (ref 15–41)
Albumin: 4.2 g/dL (ref 3.5–5.0)
Anion gap: 8 (ref 5–15)
BILIRUBIN TOTAL: 0.2 mg/dL — AB (ref 0.3–1.2)
BUN: 10 mg/dL (ref 6–20)
CALCIUM: 9.5 mg/dL (ref 8.9–10.3)
CO2: 23 mmol/L (ref 22–32)
CREATININE: 0.64 mg/dL (ref 0.44–1.00)
Chloride: 107 mmol/L (ref 101–111)
Glucose, Bld: 137 mg/dL — ABNORMAL HIGH (ref 65–99)
Potassium: 3.6 mmol/L (ref 3.5–5.1)
SODIUM: 138 mmol/L (ref 135–145)
TOTAL PROTEIN: 7.7 g/dL (ref 6.5–8.1)

## 2016-02-27 LAB — CBC
HCT: 45 % (ref 35.0–47.0)
Hemoglobin: 15.5 g/dL (ref 12.0–16.0)
MCH: 32.5 pg (ref 26.0–34.0)
MCHC: 34.3 g/dL (ref 32.0–36.0)
MCV: 94.8 fL (ref 80.0–100.0)
PLATELETS: 283 10*3/uL (ref 150–440)
RBC: 4.75 MIL/uL (ref 3.80–5.20)
RDW: 12.8 % (ref 11.5–14.5)
WBC: 9.5 10*3/uL (ref 3.6–11.0)

## 2016-02-27 LAB — LIPASE, BLOOD: Lipase: 82 U/L — ABNORMAL HIGH (ref 11–51)

## 2016-02-27 MED ORDER — PROMETHAZINE HCL 25 MG/ML IJ SOLN
12.5000 mg | Freq: Once | INTRAMUSCULAR | Status: AC
  Administered 2016-02-27: 12.5 mg via INTRAVENOUS
  Filled 2016-02-27: qty 1

## 2016-02-27 MED ORDER — DICYCLOMINE HCL 10 MG PO CAPS
20.0000 mg | ORAL_CAPSULE | Freq: Once | ORAL | Status: AC
  Administered 2016-02-27: 20 mg via ORAL

## 2016-02-27 MED ORDER — DICYCLOMINE HCL 20 MG PO TABS: 20.0000 mg | ORAL_TABLET | Freq: Three times a day (TID) | ORAL | 0 refills | Status: DC | PRN

## 2016-02-27 MED ORDER — IOPAMIDOL (ISOVUE-300) INJECTION 61%
30.0000 mL | Freq: Once | INTRAVENOUS | Status: AC
  Administered 2016-02-27: 30 mL via ORAL

## 2016-02-27 MED ORDER — RANITIDINE HCL 150 MG PO TABS
150.0000 mg | ORAL_TABLET | Freq: Two times a day (BID) | ORAL | 0 refills | Status: DC
Start: 2016-02-27 — End: 2016-06-19

## 2016-02-27 MED ORDER — IOPAMIDOL (ISOVUE-300) INJECTION 61%
75.0000 mL | Freq: Once | INTRAVENOUS | Status: AC | PRN
  Administered 2016-02-27: 75 mL via INTRAVENOUS

## 2016-02-27 MED ORDER — MORPHINE SULFATE (PF) 4 MG/ML IV SOLN
4.0000 mg | INTRAVENOUS | Status: DC | PRN
  Filled 2016-02-27: qty 1

## 2016-02-27 MED ORDER — HYDROCODONE-ACETAMINOPHEN 5-325 MG PO TABS: 1.0000 | ORAL_TABLET | ORAL | 0 refills | Status: DC | PRN

## 2016-02-27 MED ORDER — GI COCKTAIL ~~LOC~~
30.0000 mL | Freq: Once | ORAL | Status: AC
  Administered 2016-02-27: 30 mL via ORAL
  Filled 2016-02-27: qty 30

## 2016-02-27 MED ORDER — DICYCLOMINE HCL 10 MG PO CAPS
ORAL_CAPSULE | ORAL | Status: AC
  Administered 2016-02-27: 20 mg via ORAL
  Filled 2016-02-27: qty 2

## 2016-02-27 NOTE — ED Notes (Signed)
RN went to check on pt, pt very upset about the wait, RN spoke to MD who arrived in room, pt given warm blanket, pt resting in bed.

## 2016-02-27 NOTE — ED Provider Notes (Signed)
[facility] Emergency Department Provider Note    None    (approximate)  I have reviewed the triage vital signs and the nursing notes.   HISTORY  Chief Complaint Abdominal Pain    HPI [name] is a 60 y.o. female with history of alcoholic pancreatitis and alcohol abuse presents with of 4 days of midepigastric pain radiating to her back. No associated nausea or vomiting. Patient states that this worsened with eating. States that she has sharp crampy pain in her midepigastrium roughly 10-15 minutes after eating. States that she has been having increased fatty foods..  Denies any fevers. No chest pain or shortness of breath.   Past Medical History:  Diagnosis Date  . Alcohol abuse    H/o withdrawal   . Anxiety   . Chronic diarrhea   . Depression   . Elevated liver function tests   . GERD (gastroesophageal reflux disease)   . Pancreatitis   . PONV (postoperative nausea and vomiting)   . Rosacea   . Wears glasses    Family History  Problem Relation Age of Onset  . Stroke Mother   . Heart disease Father   . Heart failure Father   . Heart disease Sister    Past Surgical History:  Procedure Laterality Date  . CHOLECYSTECTOMY    . COLONOSCOPY  Never  . DIAGNOSTIC MAMMOGRAM  2008  . ERCP  10/06/2011   Procedure: ENDOSCOPIC RETROGRADE CHOLANGIOPANCREATOGRAPHY (ERCP);  Surgeon: Beryle Beams, MD;  Location: [facility];  Service: Endoscopy;  Laterality: N/A;  . ESOPHAGOGASTRODUODENOSCOPY  03/21/2011   Procedure: ESOPHAGOGASTRODUODENOSCOPY (EGD);  Surgeon: Scarlette Shorts, MD;  Location: [facility] ENDOSCOPY;  Service: Endoscopy;  Laterality: N/A;  Patient may need to be done at bedside tomorrow depending on status  . ESOPHAGOGASTRODUODENOSCOPY  01/06/2012   Procedure: ESOPHAGOGASTRODUODENOSCOPY (EGD);  Surgeon: Wonda Horner, MD;  Location: [facility] ENDOSCOPY;  Service: Endoscopy;  Laterality: N/A;  bedside  . LAPAROTOMY  01/03/2012   Procedure: EXPLORATORY  LAPAROTOMY;  Surgeon: Pedro Earls, MD;  Location: [facility] ORS;  Service: General;  Laterality: N/A;  debridement of necrotic pancreas and placement of drains x2  . SHOULDER SURGERY  [id number]  . TUBAL LIGATION     Patient Active Problem List   Diagnosis Date Noted  . Major depressive disorder, recurrent, severe without psychotic features ([place])   . Major depression 11/10/2014  . Substance induced mood disorder ([place]) 11/06/2014  . Bilateral knee pain 10/14/2014  . Alcohol use disorder, severe, dependence ([place]) 08/17/2014  . Alcohol abuse   . Suicidal behavior   . Wrist laceration   . Alcohol dependence ([place]) 10/02/2013  . Alcohol withdrawal ([place]) 10/02/2013  . Major depression, recurrent ([place]) 10/02/2013  . Gastric ulcer 01/20/2012  . Fatty liver 10/11/2011  . 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 Elevated liver function tests   . GERD (gastroesophageal reflux disease)   . ESOPHAGEAL MOTILITY DISORDER 10/01/2008  . HIATAL HERNIA 10/01/2008  . FATTY LIVER DISEASE 10/01/2008      Prior to Admission medications   Medication Sig Start Date End Date Taking? Authorizing Provider  dicyclomine (BENTYL) 20 MG tablet Take 1 tablet (20 mg total) by mouth 3 (three) times daily as needed for spasms. 02/27/16 03/05/16  Merlyn Lot, MD  HYDROcodone-acetaminophen (NORCO) 5-325 MG tablet Take 1 tablet by mouth every 4 (four) hours as needed for moderate pain. 02/27/16   Merlyn Lot, MD  ranitidine (ZANTAC) 150 MG tablet Take 1 tablet (150 mg total)  by mouth 2 (two) times daily. 02/27/16 02/26/17  Merlyn Lot, MD  valACYclovir (VALTREX) 1000 MG tablet Take 1 tablet (1,000 mg total) by mouth 2 (two) times daily. 04/10/15   Elby Beck, FNP  Zoster Vaccine Live, PF, (ZOSTAVAX) 20254 UNT/0.65ML injection Inject 19,400 Units into the skin once. 10/08/15   Elby Beck, FNP    Allergies Ciprofloxacin    Social History Social History  Substance Use Topics  . Smoking status:  Light Tobacco Smoker    Packs/day: 0.25    Years: 15.00  . Smokeless tobacco: Never Used  . Alcohol use 1.5 - 2.0 oz/week    3 - 4 Standard drinks or equivalent per week    Review of Systems Patient denies headaches, rhinorrhea, blurry vision, numbness, shortness of breath, chest pain, edema, cough, abdominal pain, nausea, vomiting, diarrhea, dysuria, fevers, rashes or hallucinations unless otherwise stated above in HPI. ____________________________________________   PHYSICAL EXAM:  VITAL SIGNS: Vitals:   02/27/16 1649 02/27/16 1757  BP:  135/86  Pulse: 70 72  Resp:  17  Temp:      Constitutional: Alert and oriented. Well appearing and in no acute distress. Eyes: Conjunctivae are normal. PERRL. EOMI. Head: Atraumatic. Nose: No congestion/rhinnorhea. Mouth/Throat: Mucous membranes are moist.  Oropharynx non-erythematous. Neck: No stridor. Painless ROM. No cervical spine tenderness to palpation Hematological/Lymphatic/Immunilogical: No cervical lymphadenopathy. Cardiovascular: Normal rate, regular rhythm. Grossly normal heart sounds.  Good peripheral circulation. Respiratory: Normal respiratory effort.  No retractions. Lungs CTAB. Gastrointestinal: Soft and nontender. No distention. No abdominal bruits. No CVA tenderness. Musculoskeletal: No lower extremity tenderness nor edema.  No joint effusions. Neurologic:  Normal speech and language. No gross focal neurologic deficits are appreciated. No gait instability. Skin:  Skin is warm, dry and intact. No rash noted. Psychiatric: Mood and affect are normal. Speech and behavior are normal.  ____________________________________________   LABS (all labs ordered are listed, but only abnormal results are displayed)  Results for orders placed or performed during the hospital encounter of 02/27/16 (from the past 24 hour(s))  Lipase, blood     Status: Abnormal   Collection Time: 02/27/16 11:44 AM  Result Value Ref Range   Lipase 82  (H) 11 - 51 U/L  Comprehensive metabolic panel     Status: Abnormal   Collection Time: 02/27/16 11:44 AM  Result Value Ref Range   Sodium 138 135 - 145 mmol/L   Potassium 3.6 3.5 - 5.1 mmol/L   Chloride 107 101 - 111 mmol/L   CO2 23 22 - 32 mmol/L   Glucose, Bld 137 (H) 65 - 99 mg/dL   BUN 10 6 - 20 mg/dL   Creatinine, Ser 0.64 0.44 - 1.00 mg/dL   Calcium 9.5 8.9 - 10.3 mg/dL   Total Protein 7.7 6.5 - 8.1 g/dL   Albumin 4.2 3.5 - 5.0 g/dL   AST 19 15 - 41 U/L   ALT 18 14 - 54 U/L   Alkaline Phosphatase 72 38 - 126 U/L   Total Bilirubin 0.2 (L) 0.3 - 1.2 mg/dL   GFR calc non Af Amer >60 >60 mL/min   GFR calc Af Amer >60 >60 mL/min   Anion gap 8 5 - 15  CBC     Status: None   Collection Time: 02/27/16 11:44 AM  Result Value Ref Range   WBC 9.5 3.6 - 11.0 K/uL   RBC 4.75 3.80 - 5.20 MIL/uL   Hemoglobin 15.5 12.0 - 16.0 g/dL   HCT 45.0 35.0 -  47.0 %   MCV 94.8 80.0 - 100.0 fL   MCH 32.5 26.0 - 34.0 pg   MCHC 34.3 32.0 - 36.0 g/dL   RDW 12.8 11.5 - 14.5 %   Platelets 283 150 - 440 K/uL  Urinalysis complete, with microscopic     Status: Abnormal   Collection Time: 02/27/16 11:44 AM  Result Value Ref Range   Color, Urine YELLOW (A) YELLOW   APPearance CLEAR (A) CLEAR   Glucose, UA NEGATIVE NEGATIVE mg/dL   Bilirubin Urine NEGATIVE NEGATIVE   Ketones, ur TRACE (A) NEGATIVE mg/dL   Specific Gravity, Urine 1.012 1.005 - 1.030   Hgb urine dipstick NEGATIVE NEGATIVE   pH 6.0 5.0 - 8.0   Protein, ur NEGATIVE NEGATIVE mg/dL   Nitrite NEGATIVE NEGATIVE   Leukocytes, UA NEGATIVE NEGATIVE   RBC / HPF 0-5 0 - 5 RBC/hpf   WBC, UA 0-5 0 - 5 WBC/hpf   Bacteria, UA NONE SEEN NONE SEEN   Squamous Epithelial / LPF 0-5 (A) NONE SEEN   Mucous PRESENT    ____________________________________________  EKG My review and personal interpretation at Time: 11:42   Indication: epigastric pain  Rate: 70  Rhythm: sinus Axis: sinus Other: no acute ischemic  changes ____________________________________________  RADIOLOGY  I personally reviewed all radiographic images ordered to evaluate for the above acute complaints and reviewed radiology reports and findings.  These findings were personally discussed with the patient.  Please see medical record for radiology report.  ____________________________________________   PROCEDURES  Procedure(s) performed: none Procedures    Critical Care performed: no ____________________________________________   INITIAL IMPRESSION / ASSESSMENT AND PLAN / ED COURSE  Pertinent labs & imaging results that were available during my care of the patient were reviewed by me and considered in my medical decision making (see chart for details).  DDX: pancreatitis, hepatitis, etoh abuse, gastritis, duodenitis  [name] is a 60 y.o. who presents to the ED with 4 days of epigastric pain and burning sensation.Patient is AFVSS in ED. Exam as above. Given current presentation have considered the above differential. Based on history CT imaging of the abdomen ordered to evaluate for any acute intra-abdominal process. Blood work is otherwise reassuring with a mild elevation of her lipase. CT imaging shows evidence of probable chronic pancreatitis with possible ductal stricture or lesion. Patient was able to tolerate oral hydration and pain control was achieved with oral medications. Spoke with Dr. Allen Norris of GI regarding the abnormality on the CT scan. As her blood work is otherwise unremarkable she is tolerating oral hydration there is no indication for emergent ERCP or further diagnostic testing. I have referred the patient to GI follow-up in [place] where they did perform outpatient ERCP for further evaluation.  Patient was able to tolerate PO and was able to ambulate with a steady gait.  Have discussed with the patient and available family all diagnostics and treatments performed thus far and all questions were  answered to the best of my ability. The patient demonstrates understanding and agreement with plan.   Clinical Course      ____________________________________________   FINAL CLINICAL IMPRESSION(S) / ED DIAGNOSES  Final diagnoses:  Acute epigastric pain  Chronic pancreatitis, unspecified pancreatitis type ([place])      NEW MEDICATIONS STARTED DURING THIS VISIT:  Discharge Medication List as of 02/27/2016  5:49 PM    START taking these medications   Details  dicyclomine (BENTYL) 20 MG tablet Take 1 tablet (20 mg total) by mouth 3 (  three) times daily as needed for spasms., Starting Thu 02/27/2016, Until Thu 03/05/2016, Print    HYDROcodone-acetaminophen (NORCO) 5-325 MG tablet Take 1 tablet by mouth every 4 (four) hours as needed for moderate pain., Starting Thu 02/27/2016, Print    ranitidine (ZANTAC) 150 MG tablet Take 1 tablet (150 mg total) by mouth 2 (two) times daily., Starting Thu 02/27/2016, Until Fri 02/26/2017, Print         Note:  This document was prepared using Dragon voice recognition software and may include unintentional dictation errors.    Merlyn Lot, MD 02/27/16 [phone]

## 2016-02-27 NOTE — Discharge Instructions (Signed)

## 2016-02-27 NOTE — ED Notes (Signed)
Pt refusing pain medication but reports she can't drink contrast due to pain. Pt repositioned and given warm blankets to wrap around abdomen.

## 2016-02-27 NOTE — ED Triage Notes (Signed)
Mid abd pain , x4 days , upon eating "severe pain" , no nausea or vomiting. Normal BMs,

## 2016-02-27 NOTE — ED Notes (Signed)
RN disconnected IV so pt could use toilet, pt unable to tolerate oral contrast, CT notified

## 2016-03-24 ENCOUNTER — Other Ambulatory Visit: Payer: Self-pay | Admitting: Gastroenterology

## 2016-03-24 DIAGNOSIS — Z8719 Personal history of other diseases of the digestive system: Secondary | ICD-10-CM

## 2016-03-24 DIAGNOSIS — R1011 Right upper quadrant pain: Secondary | ICD-10-CM

## 2016-03-24 DIAGNOSIS — R195 Other fecal abnormalities: Secondary | ICD-10-CM

## 2016-03-25 ENCOUNTER — Other Ambulatory Visit: Payer: Self-pay | Admitting: Gastroenterology

## 2016-03-25 ENCOUNTER — Ambulatory Visit
Admission: RE | Admit: 2016-03-25 | Discharge: 2016-03-25 | Disposition: A | Discharge status: Home / Self Care | Payer: Medicaid Other | Source: Ambulatory Visit | Attending: Gastroenterology | Admitting: Gastroenterology

## 2016-03-25 DIAGNOSIS — R195 Other fecal abnormalities: Secondary | ICD-10-CM

## 2016-03-25 DIAGNOSIS — K439 Ventral hernia without obstruction or gangrene: Secondary | ICD-10-CM | POA: Insufficient documentation

## 2016-03-25 DIAGNOSIS — R1011 Right upper quadrant pain: Secondary | ICD-10-CM

## 2016-03-25 DIAGNOSIS — K449 Diaphragmatic hernia without obstruction or gangrene: Secondary | ICD-10-CM | POA: Insufficient documentation

## 2016-03-25 DIAGNOSIS — Z8719 Personal history of other diseases of the digestive system: Secondary | ICD-10-CM

## 2016-03-25 DIAGNOSIS — I700 Atherosclerosis of aorta: Secondary | ICD-10-CM | POA: Insufficient documentation

## 2016-03-25 DIAGNOSIS — K861 Other chronic pancreatitis: Secondary | ICD-10-CM | POA: Insufficient documentation

## 2016-03-25 MED ORDER — IOPAMIDOL (ISOVUE-300) INJECTION 61%
100.0000 mL | Freq: Once | INTRAVENOUS | Status: AC | PRN
  Administered 2016-03-25: 100 mL via INTRAVENOUS

## 2016-04-02 ENCOUNTER — Encounter (HOSPITAL_COMMUNITY): Payer: Self-pay | Admitting: Emergency Medicine

## 2016-04-02 ENCOUNTER — Emergency Department (HOSPITAL_COMMUNITY)
Admission: EM | Admit: 2016-04-02 | Discharge: 2016-04-02 | Disposition: A | Discharge status: Home / Self Care | Payer: Medicaid Other | Attending: Emergency Medicine | Admitting: Emergency Medicine

## 2016-04-02 DIAGNOSIS — F17200 Nicotine dependence, unspecified, uncomplicated: Secondary | ICD-10-CM | POA: Insufficient documentation

## 2016-04-02 DIAGNOSIS — R1013 Epigastric pain: Secondary | ICD-10-CM

## 2016-04-02 DIAGNOSIS — K8689 Other specified diseases of pancreas: Secondary | ICD-10-CM

## 2016-04-02 DIAGNOSIS — K861 Other chronic pancreatitis: Secondary | ICD-10-CM | POA: Insufficient documentation

## 2016-04-02 DIAGNOSIS — Z79899 Other long term (current) drug therapy: Secondary | ICD-10-CM | POA: Insufficient documentation

## 2016-04-02 LAB — CBC
HEMATOCRIT: 45.7 % (ref 36.0–46.0)
Hemoglobin: 15.6 g/dL — ABNORMAL HIGH (ref 12.0–15.0)
MCH: 32.1 pg (ref 26.0–34.0)
MCHC: 34.1 g/dL (ref 30.0–36.0)
MCV: 94 fL (ref 78.0–100.0)
PLATELETS: 313 10*3/uL (ref 150–400)
RBC: 4.86 MIL/uL (ref 3.87–5.11)
RDW: 12.5 % (ref 11.5–15.5)
WBC: 9.2 10*3/uL (ref 4.0–10.5)

## 2016-04-02 LAB — COMPREHENSIVE METABOLIC PANEL
ALBUMIN: 4.1 g/dL (ref 3.5–5.0)
ALT: 16 U/L (ref 14–54)
AST: 20 U/L (ref 15–41)
Alkaline Phosphatase: 67 U/L (ref 38–126)
Anion gap: 10 (ref 5–15)
BUN: 10 mg/dL (ref 6–20)
CHLORIDE: 103 mmol/L (ref 101–111)
CO2: 22 mmol/L (ref 22–32)
CREATININE: 0.76 mg/dL (ref 0.44–1.00)
Calcium: 9.6 mg/dL (ref 8.9–10.3)
GFR calc Af Amer: >60 mL/min (ref >60)
GFR calc non Af Amer: >60 mL/min (ref >60)
GLUCOSE: 173 mg/dL — AB (ref 65–99)
POTASSIUM: 3.6 mmol/L (ref 3.5–5.1)
Sodium: 135 mmol/L (ref 135–145)
Total Bilirubin: 0.5 mg/dL (ref 0.3–1.2)
Total Protein: 7.1 g/dL (ref 6.5–8.1)

## 2016-04-02 LAB — LIPASE, BLOOD: LIPASE: 88 U/L — AB (ref 11–51)

## 2016-04-02 MED ORDER — ONDANSETRON 4 MG PO TBDP: 4.0000 mg | ORAL_TABLET | Freq: Three times a day (TID) | ORAL | 0 refills | Status: DC | PRN

## 2016-04-02 MED ORDER — HYDROCODONE-ACETAMINOPHEN 5-325 MG PO TABS: 1.0000 | ORAL_TABLET | Freq: Four times a day (QID) | ORAL | 0 refills | Status: DC | PRN

## 2016-04-02 MED ORDER — SUCRALFATE 1 GM/10ML PO SUSP
1.0000 g | Freq: Three times a day (TID) | ORAL | 0 refills | Status: DC
Start: 2016-04-02 — End: 2016-06-19

## 2016-04-02 MED ORDER — ONDANSETRON 4 MG PO TBDP
4.0000 mg | ORAL_TABLET | Freq: Once | ORAL | Status: AC
Start: 2016-04-02 — End: 2016-04-02
  Administered 2016-04-02: 4 mg via ORAL
  Filled 2016-04-02: qty 1

## 2016-04-02 MED ORDER — HYDROCODONE-ACETAMINOPHEN 5-325 MG PO TABS
2.0000 | ORAL_TABLET | Freq: Once | ORAL | Status: AC
  Administered 2016-04-02: 2 via ORAL
  Filled 2016-04-02: qty 2

## 2016-04-02 NOTE — ED Triage Notes (Signed)
Pt states she has had abd pain since the beginning of November. Pt was seen at [facility] and referred to GI. Pt emotional and states they have not given her any answers. Pt has had several CT that have been inconclusive. Denies N/V/D. Pt states she has lost weight from cutting things out of her diet due to pain when she eats.

## 2016-04-02 NOTE — ED Notes (Signed)
Pt stable, understands discharge instructions, and reasons for return.   

## 2016-04-02 NOTE — ED Provider Notes (Signed)
[facility] DEPT Provider Note   CSN: [id number] Arrival date & time: 04/02/16  1324     History   Chief Complaint Chief Complaint  Patient presents with  . Abdominal Pain    HPI [name] is a 60 y.o. female.  HPI 60 year old female with history as below including chronic alcoholism and chronic pancreatitis here with epigastric abdominal pain. The patient has a recent history of increasingly severe abdominal pain over the last several months. She has been evaluated extensively for this and has seen a GI specialist twice. She recently underwent a CT angiogram of the abdomen and pelvis several days ago which showed pancreatic duct dilatation and calcification but no evidence of vascular insufficiency. She states that since then, she has had progressively more frequent episodes of severe epigastric abdominal pain. The pain is worse with eating and resolves when not eating. The pain intermittently radiates to her back. She has nausea but no vomiting with it. No diarrhea. Pain is similar to her chronic pain. She has not had a scope for evaluation for MRCP or ERCP as she states she has no insurance.   Past Medical History:  Diagnosis Date  . Alcohol abuse    H/o withdrawal   . Anxiety   . Chronic diarrhea   . Depression   . Elevated liver function tests   . GERD (gastroesophageal reflux disease)   . Pancreatitis   . PONV (postoperative nausea and vomiting)   . Rosacea   . Wears glasses     Patient Active Problem List   Diagnosis Date Noted  . Major depressive disorder, recurrent, severe without psychotic features ([place])   . Major depression 11/10/2014  . Substance induced mood disorder ([place]) 11/06/2014  . Bilateral knee pain 10/14/2014  . Alcohol use disorder, severe, dependence ([place]) 08/17/2014  . Alcohol abuse   . Suicidal behavior   . Wrist laceration   . Alcohol dependence ([place]) 10/02/2013  . Alcohol withdrawal ([place]) 10/02/2013  . Major depression, recurrent  ([place]) 10/02/2013  . Gastric ulcer 01/20/2012  . Fatty liver 10/11/2011  . 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 Elevated liver function tests   . GERD (gastroesophageal reflux disease)   . ESOPHAGEAL MOTILITY DISORDER 10/01/2008  . HIATAL HERNIA 10/01/2008  . FATTY LIVER DISEASE 10/01/2008    Past Surgical History:  Procedure Laterality Date  . CHOLECYSTECTOMY    . COLONOSCOPY  Never  . DIAGNOSTIC MAMMOGRAM  2008  . ERCP  10/06/2011   Procedure: ENDOSCOPIC RETROGRADE CHOLANGIOPANCREATOGRAPHY (ERCP);  Surgeon: Beryle Beams, MD;  Location: [facility];  Service: Endoscopy;  Laterality: N/A;  . ESOPHAGOGASTRODUODENOSCOPY  03/21/2011   Procedure: ESOPHAGOGASTRODUODENOSCOPY (EGD);  Surgeon: Scarlette Shorts, MD;  Location: [name] ENDOSCOPY;  Service: Endoscopy;  Laterality: N/A;  Patient may need to be done at bedside tomorrow depending on status  . ESOPHAGOGASTRODUODENOSCOPY  01/06/2012   Procedure: ESOPHAGOGASTRODUODENOSCOPY (EGD);  Surgeon: Wonda Horner, MD;  Location: [facility];  Service: Endoscopy;  Laterality: N/A;  bedside  . LAPAROTOMY  01/03/2012   Procedure: EXPLORATORY LAPAROTOMY;  Surgeon: Pedro Earls, MD;  Location: [facility] ORS;  Service: General;  Laterality: N/A;  debridement of necrotic pancreas and placement of drains x2  . SHOULDER SURGERY  [id number]  . TUBAL LIGATION      OB History    No data available       Home Medications    Prior to Admission medications   Medication Sig Start Date End Date Taking? Authorizing Provider  dicyclomine (BENTYL)  20 MG tablet Take 1 tablet (20 mg total) by mouth 3 (three) times daily as needed for spasms. 02/27/16 03/05/16  Merlyn Lot, MD  HYDROcodone-acetaminophen (NORCO/VICODIN) 5-325 MG tablet Take 1-2 tablets by mouth every 6 (six) hours as needed for severe pain. 04/02/16   Duffy Bruce, MD  ondansetron (ZOFRAN ODT) 4 MG disintegrating tablet Take 1 tablet (4 mg total) by mouth every 8 (eight) hours as needed for nausea or  vomiting. 04/02/16   Duffy Bruce, MD  ranitidine (ZANTAC) 150 MG tablet Take 1 tablet (150 mg total) by mouth 2 (two) times daily. 02/27/16 02/26/17  Merlyn Lot, MD  sucralfate (CARAFATE) 1 GM/10ML suspension Take 10 mLs (1 g total) by mouth 4 (four) times daily -  with meals and at bedtime. 04/02/16   Duffy Bruce, MD  valACYclovir (VALTREX) 1000 MG tablet Take 1 tablet (1,000 mg total) by mouth 2 (two) times daily. 04/10/15   Elby Beck, FNP  Zoster Vaccine Live, PF, (ZOSTAVAX) 79390 UNT/0.65ML injection Inject 19,400 Units into the skin once. 10/08/15   Elby Beck, FNP    Family History Family History  Problem Relation Age of Onset  . Stroke Mother   . Heart disease Father   . Heart failure Father   . Heart disease Sister     Social History Social History  Substance Use Topics  . Smoking status: Light Tobacco Smoker    Packs/day: 0.25    Years: 15.00  . Smokeless tobacco: Never Used  . Alcohol use 1.5 - 2.0 oz/week    3 - 4 Standard drinks or equivalent per week     Allergies   Ciprofloxacin   Review of Systems Review of Systems  Constitutional: Positive for fatigue. Negative for chills and fever.  HENT: Negative for congestion, rhinorrhea and sore throat.   Eyes: Negative for visual disturbance.  Respiratory: Negative for cough, shortness of breath and wheezing.   Cardiovascular: Negative for chest pain and leg swelling.  Gastrointestinal: Positive for abdominal pain and nausea. Negative for diarrhea and vomiting.  Genitourinary: Negative for dysuria, flank pain, vaginal bleeding and vaginal discharge.  Musculoskeletal: Negative for neck pain.  Skin: Negative for rash.  Allergic/Immunologic: Negative for immunocompromised state.  Neurological: Negative for syncope and headaches.  Hematological: Does not bruise/bleed easily.  All other systems reviewed and are negative.    Physical Exam Updated Vital Signs BP 144/79   Pulse 62   Temp 98.4  F (36.9 C) (Oral)   Resp 18   LMP 12/08/2006   SpO2 95%   Physical Exam  Constitutional: She is oriented to person, place, and time. She appears well-developed and well-nourished. No distress.  HENT:  Head: Normocephalic and atraumatic.  Mouth/Throat: No oropharyngeal exudate.  Eyes: Conjunctivae are normal.  Neck: Neck supple.  Cardiovascular: Normal rate, regular rhythm and normal heart sounds.  Exam reveals no friction rub.   No murmur heard. Pulmonary/Chest: Effort normal and breath sounds normal. No respiratory distress. She has no wheezes. She has no rales.  Abdominal: Soft. Bowel sounds are normal. She exhibits no distension. There is tenderness. There is guarding (mild, epigastric). There is no rebound.  Musculoskeletal: She exhibits no edema.  Neurological: She is alert and oriented to person, place, and time. She exhibits normal muscle tone.  Skin: Skin is warm. Capillary refill takes less than 2 seconds.  Psychiatric: She has a normal mood and affect.  Nursing note and vitals reviewed.   ED Treatments / Results  Labs (all  labs ordered are listed, but only abnormal results are displayed) Labs Reviewed  LIPASE, BLOOD - Abnormal; Notable for the following:       Result Value   Lipase 88 (*)    All other components within normal limits  COMPREHENSIVE METABOLIC PANEL - Abnormal; Notable for the following:    Glucose, Bld 173 (*)    All other components within normal limits  CBC - Abnormal; Notable for the following:    Hemoglobin 15.6 (*)    All other components within normal limits    EKG  EKG Interpretation None       Radiology No results found.  Procedures Procedures (including critical care time)  Medications Ordered in ED Medications  HYDROcodone-acetaminophen (NORCO/VICODIN) 5-325 MG per tablet 2 tablet (2 tablets Oral Given 04/02/16 1627)  ondansetron (ZOFRAN-ODT) disintegrating tablet 4 mg (4 mg Oral Given 04/02/16 1628)     Initial  Impression / Assessment and Plan / ED Course  I have reviewed the triage vital signs and the nursing notes.  Pertinent labs & imaging results that were available during my care of the patient were reviewed by me and considered in my medical decision making (see chart for details).  Clinical Course     60 year old with past medical history as above who presents with intermittent, increasingly frequent epigastric abdominal pain associated with eating. See history of present illness above. On arrival, vital signs are stable. She is hypertensive but this is her baseline and she denies any headache, vision changes, chest pain, or signs of hypertensive urgency or emergency. Her abdominal exam is overall reassuring. Labs obtained in triage show mild, baseline elevation in lipase but otherwise normal LFTs and white blood cell count. Normal sodium and calcium. Suspect patient has acute on chronic, mild pancreatitis versus pain secondary to ductal stricture and dilatation. None of her symptoms have acutely changed and she has been evaluated extensively for this with recent negative imaging. Given similarity to chronic complaints, reassuring exam, stable vitals, do not feel repeat imaging is indicated. She is tolerating by mouth here. I do feel she likely needs outpatient ERCP or MRCP, with possible EGD. I will refer her to a new GI specialist that she is requesting this, give brief course of analgesia, and discharge home.  Final Clinical Impressions(s) / ED Diagnoses   Final diagnoses:  Epigastric pain  Chronic pancreatitis, unspecified pancreatitis type [facility])  Pancreatic duct dilated    New Prescriptions Discharge Medication List as of 04/02/2016  4:50 PM    START taking these medications   Details  ondansetron (ZOFRAN ODT) 4 MG disintegrating tablet Take 1 tablet (4 mg total) by mouth every 8 (eight) hours as needed for nausea or vomiting., Starting Thu 04/02/2016, Print    sucralfate (CARAFATE) 1  GM/10ML suspension Take 10 mLs (1 g total) by mouth 4 (four) times daily -  with meals and at bedtime., Starting Thu 04/02/2016, Print         Duffy Bruce, MD 04/02/16 [phone]

## 2016-04-03 ENCOUNTER — Encounter: Payer: Self-pay | Admitting: Family Medicine

## 2016-04-03 ENCOUNTER — Ambulatory Visit (INDEPENDENT_AMBULATORY_CARE_PROVIDER_SITE_OTHER): Payer: Medicaid Other | Admitting: Family Medicine

## 2016-04-03 VITALS — BP 140/79 | HR 63 | Temp 98.1°F | Ht 66.0 in | Wt 155.0 lb

## 2016-04-03 DIAGNOSIS — Z7189 Other specified counseling: Secondary | ICD-10-CM | POA: Insufficient documentation

## 2016-04-03 DIAGNOSIS — K8689 Other specified diseases of pancreas: Secondary | ICD-10-CM

## 2016-04-03 DIAGNOSIS — R1011 Right upper quadrant pain: Secondary | ICD-10-CM

## 2016-04-03 DIAGNOSIS — Z7689 Persons encountering health services in other specified circumstances: Secondary | ICD-10-CM | POA: Diagnosis not present

## 2016-04-03 DIAGNOSIS — K460 Unspecified abdominal hernia with obstruction, without gangrene: Secondary | ICD-10-CM | POA: Diagnosis not present

## 2016-04-03 DIAGNOSIS — K436 Other and unspecified ventral hernia with obstruction, without gangrene: Secondary | ICD-10-CM

## 2016-04-03 DIAGNOSIS — K439 Ventral hernia without obstruction or gangrene: Secondary | ICD-10-CM | POA: Insufficient documentation

## 2016-04-03 HISTORY — DX: Other specified diseases of pancreas: K86.89

## 2016-04-03 HISTORY — DX: Right upper quadrant pain: R10.11

## 2016-04-03 MED ORDER — HYDROCODONE-ACETAMINOPHEN 5-325 MG PO TABS: 1.0000 | ORAL_TABLET | Freq: Four times a day (QID) | ORAL | 0 refills | Status: DC | PRN

## 2016-04-03 MED FILL — HYDROCODON-APAP 5-325: 5-325 | 8 days supply | Qty: 30 | Fill #0

## 2016-04-03 MED FILL — valACYclovir HCL 1 GM TABS: 1 | 5 days supply | Qty: 10 | Fill #1

## 2016-04-03 NOTE — Patient Instructions (Signed)
It was a pleasure seeing you today in our clinic. Today we discussed your abdominal pain. Here is the treatment plan we have discussed and agreed upon together:   - I have placed a urgent referral to general surgery. You'll be contacted within the next week to schedule an appointment with their office to discuss your abdominal hernia and pain. - I have placed an urgent referral to gastroenterology. Will be contacted within the next week to schedule an appointment with her office to discuss your abdominal pain and pancreatic calcification. - I would like to see you back in our office in 1-2 months. If you have any issues scheduling an appointment with these offices do not hesitate to contact our office. - If you have any significant worsening in abdominal pain, develop fevers, chills, or intractable vomiting do not hesitate to report to the nearest emergency department for further workup.

## 2016-04-03 NOTE — Assessment & Plan Note (Signed)
Discussed return precautions - See plan above

## 2016-04-03 NOTE — Assessment & Plan Note (Addendum)
Patient is here with complaints of right-sided upper quadrant pain. She was recently seen in the ED which a CT scan was obtained. This showed worsening calcification of the head of the pancreas with dilated biliary ducts, and a large ventral hernia with bowel loops present. - Urgent referral to gastroenterology to assess pancreatic changes and pain. - Urgent referral to general surgery to assess large ventral hernia with partially reducible bowel loops and abdominal pain. - 30 tablets Norco 5/325 provided. Discussed that this was for acute pain and no long-term prescriptions will be provided. Strict consultation to limit use to every 6 hours when necessary to avoid constipation. - Encouraged Tylenol OTC and ice packs/heat packs as needed. - Encouraged adequate hydration with water and small meals >> clear liquid diet if necessary - Strict return precautions discussed.  Of note: CT scan did show what appeared to be a slightly abnormal adhesive area of tissue on the superficial aspect of the liver near the diaphragm. This may be a result of the imaging slicing on the diaphragm (as this was not discussed in the radiology read); but it would also be nice to have more experienced eyes to take a look at this.

## 2016-04-03 NOTE — Assessment & Plan Note (Signed)
Patient is here to establish care in our clinic for primary care services. Majority of our time was spent discussing acute issues. We were able to discuss the fact the patient has a long history of alcohol use/abuse. She is not currently completely sober but states that she does not "drink like I use to". It appears as though patient is a current smoker. - Due to the acuity of patient's other issues I've asked that she come back in 1-2 months to go over more long-term health related goals.

## 2016-04-03 NOTE — Progress Notes (Signed)
HPI  CC: Establishing care and abdominal pain Patient is here to establish care in our clinic. She states that she has needed to establish care with a primary care physician as she has been experiencing significant abdominal pain requiring visits to the ED.   Patient is experiencing substantial right upper quadrant pain and periumbilical pain fairly constantly throughout her day. This pain has been going on for multiple months. It has gotten acutely worse over the past couple weeks. Pain is worse with any and all food. Pain feels stabbing and can occasionally feel like it is involving her back as well. She denies any trauma or injury. She states that she has had emergency abdominal surgery in the past for a "cyst on my pancreas". She believes this was back in 2012.  Patient also endorses worsening abdominal mass just left of her umbilicus. The mass is soft but can occasionally become firm and painful. It seems to get more painful the more she eats. It hasn't caused too much discomfort over the past 2 weeks because she has been avoiding large meals with her right upper quadrant pain. Patient denies any recent fever, chills, headache, blurred vision, diaphoresis, shortness of breath, chest pain, nausea, diarrhea, dysuria, hematochezia, or melena.  It appears as though patient has received most of her medical care from urgent care and community health and wellness. There are multiple recent telephone notes by gastroenterology at [facility]. Patient is asking for a new referral to GI today.  Review of Systems    See HPI for ROS. All other systems reviewed and are negative.  CC, SH/smoking status, and VS noted  Objective: BP 140/79 (BP Location: Left Arm, Patient Position: Sitting, Cuff Size: Normal)   Pulse 63   Temp 98.1 F (36.7 C) (Oral)   Ht '5\' 6"'$  (1.676 m)   Wt 155 lb (70.3 kg)   LMP 12/08/2006   SpO2 98%   BMI 25.02 kg/m  Gen: NAD, alert, cooperative, and pleasant. HEENT: NCAT,  EOMI, PERRL, no scleral icterus, MMM CV: RRR, no murmur Resp: CTAB, no wheezes, non-labored Abd: S, generalized upper quadrant tenderness to palpation, periumbilical tenderness to palpation, large ventral hernia with bowel loops present producing a large soft bulging mass immediately left of the umbilicus that is partially reducible, BS present, no guarding or organomegaly. Previous surgical scars and port sites present. Ext: No edema, warm Neuro: Alert and oriented, Speech clear, No gross deficits  Assessment and plan:  Abdominal pain, right upper quadrant Patient is here with complaints of right-sided upper quadrant pain. She was recently seen in the ED which a CT scan was obtained. This showed worsening calcification of the head of the pancreas with dilated biliary ducts, and a large ventral hernia with bowel loops present. - Urgent referral to gastroenterology to assess pancreatic changes and pain. - Urgent referral to general surgery to assess large ventral hernia with partially reducible bowel loops and abdominal pain. - 30 tablets Norco 5/325 provided. Discussed that this was for acute pain and no long-term prescriptions will be provided. Strict consultation to limit use to every 6 hours when necessary to avoid constipation. - Encouraged Tylenol OTC and ice packs/heat packs as needed. - Encouraged adequate hydration with water and small meals >> clear liquid diet if necessary - Strict return precautions discussed.  Of note: CT scan did show what appeared to be a slightly abnormal adhesive area of tissue on the superficial aspect of the liver near the diaphragm. This may be  a result of the imaging slicing on the diaphragm (as this was not discussed in the radiology read); but it would also be nice to have more experienced eyes to take a look at this.  Irreducible ventral hernia Discussed return precautions - See plan above  Pancreatic calcification Discussed return precautions - See  plan above  Establishing care with new doctor, encounter for Patient is here to establish care in our clinic for primary care services. Majority of our time was spent discussing acute issues. We were able to discuss the fact the patient has a long history of alcohol use/abuse. She is not currently completely sober but states that she does not "drink like I use to". It appears as though patient is a current smoker. - Due to the acuity of patient's other issues I've asked that she come back in 1-2 months to go over more long-term health related goals.   Orders Placed This Encounter  Procedures  . Ambulatory referral to General Surgery    Referral Priority:   Urgent    Referral Type:   Surgical    Referral Reason:   Specialty Services Required    Requested Specialty:   General Surgery    Number of Visits Requested:   1  . Ambulatory referral to Gastroenterology    Referral Priority:   Urgent    Referral Type:   Consultation    Referral Reason:   Specialty Services Required    Number of Visits Requested:   1    Meds ordered this encounter  Medications  . HYDROcodone-acetaminophen (NORCO/VICODIN) 5-325 MG tablet    Sig: Take 1 tablet by mouth every 6 (six) hours as needed for severe pain.    Dispense:  30 tablet    Refill:  0     Elberta Leatherwood, MD,MS,  PGY3 04/03/2016 5:30 PM

## 2016-04-15 ENCOUNTER — Other Ambulatory Visit: Payer: Self-pay | Admitting: Family Medicine

## 2016-04-15 ENCOUNTER — Telehealth: Payer: Self-pay | Admitting: Family Medicine

## 2016-04-15 MED ORDER — HYDROCODONE-ACETAMINOPHEN 5-325 MG PO TABS: 1.0000 | ORAL_TABLET | Freq: Four times a day (QID) | ORAL | 0 refills | Status: DC | PRN

## 2016-04-15 MED FILL — HYDROCODON-APAP 5-325: 5-325 | 10 days supply | Qty: 40 | Fill #0

## 2016-04-15 NOTE — Telephone Encounter (Signed)
Pt is calling back to check the status of her request for pain medication. She is in a lot of pain.  Please let her know. jw

## 2016-04-15 NOTE — Telephone Encounter (Signed)
Patient asked not to take narcotic more than 4 times a day at the last visit. Patient's next appointment is 9 days away. Will fill for 40 total tablets.  Prescription printed off and handed to nursing staff.

## 2016-04-15 NOTE — Telephone Encounter (Signed)
Pt called and would like a refill on her pain medication. She is having surgery on 04/24/16, but will not have enough medication to last. Can we write enough to last until 04/24/16? Please let patient know when she can pick this up. jw

## 2016-04-18 ENCOUNTER — Observation Stay (HOSPITAL_COMMUNITY)
Admission: EM | Admit: 2016-04-18 | Discharge: 2016-04-19 | Disposition: A | Discharge status: Home / Self Care | Payer: Medicaid Other | Attending: Family Medicine | Admitting: Family Medicine

## 2016-04-18 ENCOUNTER — Encounter (HOSPITAL_COMMUNITY): Payer: Self-pay | Admitting: Emergency Medicine

## 2016-04-18 DIAGNOSIS — F17210 Nicotine dependence, cigarettes, uncomplicated: Secondary | ICD-10-CM | POA: Insufficient documentation

## 2016-04-18 DIAGNOSIS — K439 Ventral hernia without obstruction or gangrene: Secondary | ICD-10-CM | POA: Insufficient documentation

## 2016-04-18 DIAGNOSIS — K861 Other chronic pancreatitis: Secondary | ICD-10-CM | POA: Diagnosis present

## 2016-04-18 DIAGNOSIS — R1013 Epigastric pain: Principal | ICD-10-CM | POA: Insufficient documentation

## 2016-04-18 DIAGNOSIS — K219 Gastro-esophageal reflux disease without esophagitis: Secondary | ICD-10-CM | POA: Insufficient documentation

## 2016-04-18 DIAGNOSIS — Z79899 Other long term (current) drug therapy: Secondary | ICD-10-CM | POA: Insufficient documentation

## 2016-04-18 LAB — COMPREHENSIVE METABOLIC PANEL
ALK PHOS: 65 U/L (ref 38–126)
ALT: 14 U/L (ref 14–54)
ANION GAP: 9 (ref 5–15)
AST: 18 U/L (ref 15–41)
Albumin: 3.6 g/dL (ref 3.5–5.0)
BUN: 8 mg/dL (ref 6–20)
CALCIUM: 9.5 mg/dL (ref 8.9–10.3)
CO2: 24 mmol/L (ref 22–32)
CREATININE: 0.71 mg/dL (ref 0.44–1.00)
Chloride: 105 mmol/L (ref 101–111)
Glucose, Bld: 125 mg/dL — ABNORMAL HIGH (ref 65–99)
Potassium: 3.7 mmol/L (ref 3.5–5.1)
SODIUM: 138 mmol/L (ref 135–145)
TOTAL PROTEIN: 7.2 g/dL (ref 6.5–8.1)
Total Bilirubin: 0.4 mg/dL (ref 0.3–1.2)

## 2016-04-18 LAB — URINALYSIS, ROUTINE W REFLEX MICROSCOPIC
Bilirubin Urine: NEGATIVE
Glucose, UA: NEGATIVE mg/dL
Ketones, ur: NEGATIVE mg/dL
NITRITE: NEGATIVE
PH: 5 (ref 5.0–8.0)
PROTEIN: NEGATIVE mg/dL
SPECIFIC GRAVITY, URINE: 1.011 (ref 1.005–1.030)

## 2016-04-18 LAB — CBC WITH DIFFERENTIAL/PLATELET
BASOS ABS: 0.1 10*3/uL (ref 0.0–0.1)
Basophils Relative: 1 %
EOS PCT: 2 %
Eosinophils Absolute: 0.2 10*3/uL (ref 0.0–0.7)
HEMATOCRIT: 44.4 % (ref 36.0–46.0)
Hemoglobin: 15.3 g/dL — ABNORMAL HIGH (ref 12.0–15.0)
LYMPHS ABS: 3.1 10*3/uL (ref 0.7–4.0)
LYMPHS PCT: 28 %
MCH: 32.1 pg (ref 26.0–34.0)
MCHC: 34.5 g/dL (ref 30.0–36.0)
MCV: 93.3 fL (ref 78.0–100.0)
MONO ABS: 0.8 10*3/uL (ref 0.1–1.0)
MONOS PCT: 7 %
NEUTROS ABS: 6.8 10*3/uL (ref 1.7–7.7)
Neutrophils Relative %: 62 %
Platelets: 302 10*3/uL (ref 150–400)
RBC: 4.76 MIL/uL (ref 3.87–5.11)
RDW: 12.4 % (ref 11.5–15.5)
WBC: 10.8 10*3/uL — ABNORMAL HIGH (ref 4.0–10.5)

## 2016-04-18 LAB — LIPASE, BLOOD: Lipase: 39 U/L (ref 11–51)

## 2016-04-18 LAB — ETHANOL: Alcohol, Ethyl (B): <5 mg/dL (ref <5)

## 2016-04-18 MED ORDER — HYDROMORPHONE HCL 2 MG/ML IJ SOLN
0.5000 mg | Freq: Once | INTRAMUSCULAR | Status: AC
  Administered 2016-04-18: 0.5 mg via INTRAVENOUS
  Filled 2016-04-18: qty 1

## 2016-04-18 MED ORDER — SODIUM CHLORIDE 0.9 % IV BOLUS (SEPSIS)
500.0000 mL | Freq: Once | INTRAVENOUS | Status: AC
  Administered 2016-04-18: 500 mL via INTRAVENOUS

## 2016-04-18 NOTE — Progress Notes (Signed)
[facility] Consult Note Service Pager: [phone]  Patient name: [name] Medical record number: [id number] Date of birth: 1955/09/22 Age: 60 y.o. Gender: female  Primary Care Provider: Georges Lynch, MD  Chief Complaint: abdominal pain  Assessment and Plan: [name] is a 60 y.o. female presenting with abdominal pain. PMH is significant for chronic pancreatitis, history of alcohol abuse, GERD, ventral hiatal hernia, and depression.  Abdominal pain: Likely 2/2 flare of chronic pancreatitis. CT with stable calcifications within pancreas and possibly in pancreatic duct on 12/06. Lipase WNL in ED.  Afebrile with WBC WNL. Cr WNL. EtOH level <5. UA with trace leuks and small Hgb, however patient denying any urinary symptoms. Patient requesting to go home rather than proceed with inpatient management.  - As patient's labs are unremarkable and she is tolerating PO, feel that she is safe for discharge home per her request with planned follow up. Discussed with ED attending who is also in agreement.  - F/u with [name] on Fri 01/05 as planned - Patient given additional 10 tablets of hydrocodone to last until her appointment on Friday.  - Patient encouraged to remain hydrated - Return precautions given - Scheduled to see PCP Dr. Alease Frame again in about a month  Disposition: home  History of Present Illness:  [name] is a 60 y.o. female presenting with abdominal pain.  Patient was diagnosed with chronic pancreatitis two months ago, and has had essentially constant pain since that time. She has an appointment at [facility] next week for evaluation for MRCP, and has been controlling her pain up to this point with Norco prescribed by her PCP Dr. Alease Frame. Patient reports taking Norco only before eating lunch and dinner with fair pain control, however yesterday her pain began to worsen, and has continued to do so since. She  presented to ED tonight because she was concerned something in her pancreas might rupture. She describes pain as located in upper quadrants bilaterally, but points to a specific area in the RUQ where she reports an intermittent stabbing pain. When the pain is most intense, it radiates to her back as well. She denies nausea, vomiting, diarrhea, fevers, chills. Endorses some constipation that she attributes to Norco. Also endorses 8 lb weight loss, but says this is because she is eating less recently. She eats less now and primarily eats only fruits, vegetables, and one bowl of cereal a day, because eating causes her pain to increase. She is tolerating PO well however. Patient repeatedly stating that she would prefer to go home and continue to manage her symptoms with PO pain meds until her surgery appointment on Friday.   Review Of Systems: Per HPI with the following additions:  Review of Systems  Constitutional: Positive for weight loss. Negative for chills and fever.  Gastrointestinal: Positive for abdominal pain and constipation. Negative for blood in stool, diarrhea, nausea and vomiting.  Genitourinary: Negative for dysuria, frequency, hematuria and urgency.    Patient Active Problem List   Diagnosis Date Noted  . Chronic pancreatitis ([place]) 04/18/2016  . Pancreatic calcification 04/03/2016  . Irreducible ventral hernia 04/03/2016  . Abdominal pain, right upper quadrant 04/03/2016  . Establishing care with new doctor, encounter for 04/03/2016  . Major depressive disorder, recurrent, severe without psychotic features ([place])   . Major depression 11/10/2014  . Substance induced mood disorder ([place]) 11/06/2014  . Bilateral knee pain 10/14/2014  . Alcohol use disorder, severe, dependence ([place]) 08/17/2014  . Alcohol abuse   .  Suicidal behavior   . Alcohol dependence ([place]) 10/02/2013  . Alcohol withdrawal ([place]) 10/02/2013  . Major depression, recurrent ([place]) 10/02/2013  . Gastric ulcer 01/20/2012   . Fatty liver 10/11/2011  . 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 Elevated liver function tests   . GERD (gastroesophageal reflux disease)   . ESOPHAGEAL MOTILITY DISORDER 10/01/2008  . HIATAL HERNIA 10/01/2008  . FATTY LIVER DISEASE 10/01/2008    Past Medical History: Past Medical History:  Diagnosis Date  . Alcohol abuse    H/o withdrawal   . Anxiety   . Chronic diarrhea   . Depression   . Elevated liver function tests   . GERD (gastroesophageal reflux disease)   . Pancreatitis   . PONV (postoperative nausea and vomiting)   . Rosacea   . Wears glasses     Past Surgical History: Past Surgical History:  Procedure Laterality Date  . CHOLECYSTECTOMY    . COLONOSCOPY  Never  . DIAGNOSTIC MAMMOGRAM  2008  . ERCP  10/06/2011   Procedure: ENDOSCOPIC RETROGRADE CHOLANGIOPANCREATOGRAPHY (ERCP);  Surgeon: Beryle Beams, MD;  Location: [facility];  Service: Endoscopy;  Laterality: N/A;  . ESOPHAGOGASTRODUODENOSCOPY  03/21/2011   Procedure: ESOPHAGOGASTRODUODENOSCOPY (EGD);  Surgeon: Scarlette Shorts, MD;  Location: [facility] ENDOSCOPY;  Service: Endoscopy;  Laterality: N/A;  Patient may need to be done at bedside tomorrow depending on status  . ESOPHAGOGASTRODUODENOSCOPY  01/06/2012   Procedure: ESOPHAGOGASTRODUODENOSCOPY (EGD);  Surgeon: Wonda Horner, MD;  Location: [facility] ENDOSCOPY;  Service: Endoscopy;  Laterality: N/A;  bedside  . LAPAROTOMY  01/03/2012   Procedure: EXPLORATORY LAPAROTOMY;  Surgeon: Pedro Earls, MD;  Location: [facility] ORS;  Service: General;  Laterality: N/A;  debridement of necrotic pancreas and placement of drains x2  . SHOULDER SURGERY  [id number]  . TUBAL LIGATION      Social History: Social History  Substance Use Topics  . Smoking status: Light Tobacco Smoker    Packs/day: 0.25    Years: 15.00    Types: Cigarettes  . Smokeless tobacco: Never Used  . Alcohol use 1.5 - 2.0 oz/week    3 - 4 Standard drinks or equivalent per week   Additional social history: smokes  cigarettes 1 PPD, drinks EtOH "some" but not much lately, last drink one beer last night; smokes marijuana two times a month, no other illicit drugs  Please also refer to relevant sections of EMR.  Family History: Family History  Problem Relation Age of Onset  . Stroke Mother   . Heart disease Father   . Heart failure Father   . Heart disease Sister     Allergies and Medications: Allergies  Allergen Reactions  . Ciprofloxacin Nausea Only   No current facility-administered medications on file prior to encounter.    Current Outpatient Prescriptions on File Prior to Encounter  Medication Sig Dispense Refill  . dicyclomine (BENTYL) 20 MG tablet Take 1 tablet (20 mg total) by mouth 3 (three) times daily as needed for spasms. (Patient not taking: Reported on 04/18/2016) 26 tablet 0  . ondansetron (ZOFRAN ODT) 4 MG disintegrating tablet Take 1 tablet (4 mg total) by mouth every 8 (eight) hours as needed for nausea or vomiting. (Patient not taking: Reported on 04/18/2016) 12 tablet 0  . ranitidine (ZANTAC) 150 MG tablet Take 1 tablet (150 mg total) by mouth 2 (two) times daily. (Patient not taking: Reported on 04/18/2016) 60 tablet 0  . sucralfate (CARAFATE) 1 GM/10ML suspension Take 10 mLs (1 g total) by mouth 4 (  four) times daily -  with meals and at bedtime. (Patient not taking: Reported on 04/18/2016) 420 mL 0  . valACYclovir (VALTREX) 1000 MG tablet Take 1 tablet (1,000 mg total) by mouth 2 (two) times daily. (Patient not taking: Reported on 04/18/2016) 10 tablet 1  . Zoster Vaccine Live, PF, (ZOSTAVAX) 18288 UNT/0.65ML injection Inject 19,400 Units into the skin once. 1 each 0  . [DISCONTINUED] ipratropium (ATROVENT) 0.06 % nasal spray Place 2 sprays into both nostrils 4 (four) times daily. 15 mL 0    Objective: BP 126/71   Pulse (!) 57   Temp 98.5 F (36.9 C) (Oral)   Resp 13   LMP 12/08/2006   SpO2 94%  Exam: General: sitting up in bed in NAD; very pleasant Eyes: PERRLA, EOMI,  no scleral icterus ENTM: MMM, no oropharyngeal erythema or exudates Neck: Supple Cardiovascular: RRR, no murmurs appreciated Respiratory: CTAB, normal WOB on RA Gastrointestinal: TTP of upper quadrants bilaterally; non-tender reducible periumbilical hernia present; non-distended, +BS MSK: Moving all extremities spontaneously Derm: No rashes or bruises noted Neuro: A&Ox3, no focal deficits Psych: Appropriate mood and affect  Labs and Imaging: CBC BMET   Recent Labs Lab 04/18/16 2111  WBC 10.8*  HGB 15.3*  HCT 44.4  PLT 302    Recent Labs Lab 04/18/16 2111  NA 138  K 3.7  CL 105  CO2 24  BUN 8  CREATININE 0.71  GLUCOSE 125*  CALCIUM 9.5     No results found.  Verner Mould, MD 04/19/2016, 5:53 PM PGY-2, [place] [facility] pager: [phone], text pages welcome

## 2016-04-18 NOTE — ED Triage Notes (Signed)
Patient reports upper abdominal pain for several months , denies nausea/emesis or diarrhea , no fever or chills , appointment with surgeon this coming Friday , she has a history of pancreatitis.

## 2016-04-18 NOTE — ED Provider Notes (Signed)
[facility] DEPT Provider Note   CSN: [id number] Arrival date & time: 04/18/16  2013     History   Chief Complaint Chief Complaint  Patient presents with  . Abdominal Pain    HPI Classie Weng is a 60 y.o. female.  HPI  she has had upper abdominal pain for months. Worse today. Has been dealing with chronic pancreatitis. No fevers. States it had been controlled with her pain medicines at home but now not controlled. States she had been eating less but now cannot eat. Has not had vomiting. Also has a known ventral hernia. Has appointment to see general surgery on Friday. She is previously had occult L. Denies alcohol use. No diarrhea.   Past Medical History:  Diagnosis Date  . Alcohol abuse    H/o withdrawal   . Anxiety   . Chronic diarrhea   . Depression   . Elevated liver function tests   . GERD (gastroesophageal reflux disease)   . Pancreatitis   . PONV (postoperative nausea and vomiting)   . Rosacea   . Wears glasses     Patient Active Problem List   Diagnosis Date Noted  . Pancreatic calcification 04/03/2016  . Irreducible ventral hernia 04/03/2016  . Abdominal pain, right upper quadrant 04/03/2016  . Establishing care with new doctor, encounter for 04/03/2016  . Major depressive disorder, recurrent, severe without psychotic features ([place])   . Major depression 11/10/2014  . Substance induced mood disorder ([place]) 11/06/2014  . Bilateral knee pain 10/14/2014  . Alcohol use disorder, severe, dependence ([place]) 08/17/2014  . Alcohol abuse   . Suicidal behavior   . Alcohol dependence ([place]) 10/02/2013  . Alcohol withdrawal ([place]) 10/02/2013  . Major depression, recurrent ([place]) 10/02/2013  . Gastric ulcer 01/20/2012  . Fatty liver 10/11/2011  . 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 Elevated liver function tests   . GERD (gastroesophageal reflux disease)   . ESOPHAGEAL MOTILITY DISORDER 10/01/2008  . HIATAL HERNIA 10/01/2008  . FATTY LIVER DISEASE 10/01/2008      Past Surgical History:  Procedure Laterality Date  . CHOLECYSTECTOMY    . COLONOSCOPY  Never  . DIAGNOSTIC MAMMOGRAM  2008  . ERCP  10/06/2011   Procedure: ENDOSCOPIC RETROGRADE CHOLANGIOPANCREATOGRAPHY (ERCP);  Surgeon: Beryle Beams, MD;  Location: [facility] ENDOS[facility];  Service: Endoscopy;  Laterality: N/A;  . ESOPHAGOGASTRODUODENOSCOPY  03/21/2011   Procedure: ESOPHAGOGASTRODUODENOSCOPY (EGD);  Surgeon: Scarlette Shorts, MD;  Location: [facility] ENDOSCOPY;  Service: Endoscopy;  Laterality: N/A;  Patient may need to be done at bedside tomorrow depending on status  . ESOPHAGOGASTRODUODENOSCOPY  01/06/2012   Procedure: ESOPHAGOGASTRODUODENOSCOPY (EGD);  Surgeon: Wonda Horner, MD;  Location: [facility];  Service: Endoscopy;  Laterality: N/A;  bedside  . LAPAROTOMY  01/03/2012   Procedure: EXPLORATORY LAPAROTOMY;  Surgeon: Pedro Earls, MD;  Location: [facility] ORS;  Service: General;  Laterality: N/A;  debridement of necrotic pancreas and placement of drains x2  . SHOULDER SURGERY  [id number]  . TUBAL LIGATION      OB History    No data available       Home Medications    Prior to Admission medications   Medication Sig Start Date End Date Taking? Authorizing Provider  dicyclomine (BENTYL) 20 MG tablet Take 1 tablet (20 mg total) by mouth 3 (three) times daily as needed for spasms. 02/27/16 03/05/16  Merlyn Lot, MD  HYDROcodone-acetaminophen (NORCO/VICODIN) 5-325 MG tablet Take 1 tablet by mouth every 6 (six) hours as needed for severe pain. 04/15/16  Elberta Leatherwood, MD  ondansetron (ZOFRAN ODT) 4 MG disintegrating tablet Take 1 tablet (4 mg total) by mouth every 8 (eight) hours as needed for nausea or vomiting. 04/02/16   Duffy Bruce, MD  ranitidine (ZANTAC) 150 MG tablet Take 1 tablet (150 mg total) by mouth 2 (two) times daily. 02/27/16 02/26/17  Merlyn Lot, MD  sucralfate (CARAFATE) 1 GM/10ML suspension Take 10 mLs (1 g total) by mouth 4 (four) times daily -  with meals and at bedtime.  04/02/16   Duffy Bruce, MD  valACYclovir (VALTREX) 1000 MG tablet Take 1 tablet (1,000 mg total) by mouth 2 (two) times daily. 04/10/15   Elby Beck, FNP  Zoster Vaccine Live, PF, (ZOSTAVAX) 84696 UNT/0.65ML injection Inject 19,400 Units into the skin once. 10/08/15   Elby Beck, FNP    Family History Family History  Problem Relation Age of Onset  . Stroke Mother   . Heart disease Father   . Heart failure Father   . Heart disease Sister     Social History Social History  Substance Use Topics  . Smoking status: Light Tobacco Smoker    Packs/day: 0.25    Years: 15.00    Types: Cigarettes  . Smokeless tobacco: Never Used  . Alcohol use 1.5 - 2.0 oz/week    3 - 4 Standard drinks or equivalent per week     Allergies   Ciprofloxacin   Review of Systems Review of Systems  Constitutional: Positive for appetite change.  HENT: Negative for congestion.   Respiratory: Negative for cough and shortness of breath.   Cardiovascular: Negative for chest pain.  Gastrointestinal: Positive for abdominal pain. Negative for constipation, nausea and vomiting.  Genitourinary: Negative for flank pain and menstrual problem.  Musculoskeletal: Negative for back pain.  Skin: Negative for wound.  Neurological: Negative for numbness.     Physical Exam Updated Vital Signs BP 156/84   Pulse 80   Temp 98.5 F (36.9 C) (Oral)   Resp 18   LMP 12/08/2006   SpO2 98%   Physical Exam  Constitutional: She appears well-developed.  Patient appears uncomfortable  HENT:  Head: Atraumatic.  Neck: Neck supple.  Pulmonary/Chest: Effort normal.  Abdominal: There is tenderness.  Epigastric upper abdominal tenderness. Has reducible periumbilical ventral hernia.  Musculoskeletal: She exhibits no edema.  Neurological: She is alert.  Skin: Skin is warm. Capillary refill takes less than 2 seconds.     ED Treatments / Results  Labs (all labs ordered are listed, but only abnormal  results are displayed) Labs Reviewed  COMPREHENSIVE METABOLIC PANEL - Abnormal; Notable for the following:       Result Value   Glucose, Bld 125 (*)    All other components within normal limits  URINALYSIS, ROUTINE W REFLEX MICROSCOPIC - Abnormal; Notable for the following:    Hgb urine dipstick SMALL (*)    Leukocytes, UA TRACE (*)    Bacteria, UA RARE (*)    Squamous Epithelial / LPF 0-5 (*)    All other components within normal limits  CBC WITH DIFFERENTIAL/PLATELET - Abnormal; Notable for the following:    WBC 10.8 (*)    Hemoglobin 15.3 (*)    All other components within normal limits  LIPASE, BLOOD  ETHANOL    EKG  EKG Interpretation None       Radiology No results found.  Procedures Procedures (including critical care time)  Medications Ordered in ED Medications  sodium chloride 0.9 % bolus 500 mL (0  mLs Intravenous Stopped 04/18/16 2255)  HYDROmorphone (DILAUDID) injection 0.5 mg (0.5 mg Intravenous Given 04/18/16 2154)     Initial Impression / Assessment and Plan / ED Course  I have reviewed the triage vital signs and the nursing notes.  Pertinent labs & imaging results that were available during my care of the patient were reviewed by me and considered in my medical decision making (see chart for details).  Clinical Course   Patient presents with acute on chronic abdominal pain. Began to worsen today. Has history of pancreatitis and has some chronic calcific pancreatitis with obstruction. Has needed an MRCP or ERCP and has not been able to get it done. She's been eating very little at home. Eating is made the pain worse. States she is normal no fat. She is on pain medicines to her primary care doctor. States the pain medicines are not helping. Lab work overall reassuring but continued pain. Will discuss with family practice for possible admission since she's been has been on mostly bowel rest at home with worsening pain. Lipase is normal however we may still  may be too early for it to have risen.  Final Clinical Impressions(s) / ED Diagnoses   Final diagnoses:  Chronic pancreatitis, unspecified pancreatitis type ([place])  Epigastric pain    New Prescriptions New Prescriptions   No medications on file     Davonna Belling, MD 04/18/16 2302

## 2016-04-19 MED ORDER — HYDROCODONE-ACETAMINOPHEN 5-325 MG PO TABS: 1.0000 | ORAL_TABLET | ORAL | 0 refills | Status: DC | PRN

## 2016-04-19 NOTE — ED Provider Notes (Signed)
12:08 AM Patient previously admitted by Dr. Alvino Chapel.   Family practice has seen patient. They have cleared her for discharge and they state patient wants to go home. Plan is for her to follow-up with surgery as scheduled next week. They have discussed plan and return instructions with patient.   They request rx for #10 Vicodin which patient has been using at home for pain.   BP 120/66   Pulse (!) 52   Temp 98.5 F (36.9 C) (Oral)   Resp 14   LMP 12/08/2006   SpO2 94%     Carlisle Cater, PA-C 04/19/16 0010    Davonna Belling, MD 04/19/16 [phone]

## 2016-04-23 ENCOUNTER — Ambulatory Visit (INDEPENDENT_AMBULATORY_CARE_PROVIDER_SITE_OTHER): Payer: Medicaid Other | Admitting: Family Medicine

## 2016-04-23 ENCOUNTER — Encounter: Payer: Self-pay | Admitting: Family Medicine

## 2016-04-23 ENCOUNTER — Emergency Department (HOSPITAL_COMMUNITY)
Admission: EM | Admit: 2016-04-23 | Discharge: 2016-04-23 | Disposition: A | Discharge status: Home / Self Care | Payer: Medicaid Other | Attending: Emergency Medicine | Admitting: Emergency Medicine

## 2016-04-23 ENCOUNTER — Inpatient Hospital Stay: Admission: AD | Admit: 2016-04-23 | Payer: Medicaid Other | Source: Ambulatory Visit | Admitting: Family Medicine

## 2016-04-23 ENCOUNTER — Encounter (HOSPITAL_COMMUNITY): Payer: Self-pay

## 2016-04-23 ENCOUNTER — Other Ambulatory Visit: Payer: Self-pay | Admitting: Family Medicine

## 2016-04-23 VITALS — BP 140/92 | HR 86 | Temp 97.9°F | Ht 66.0 in | Wt 147.2 lb

## 2016-04-23 DIAGNOSIS — F17210 Nicotine dependence, cigarettes, uncomplicated: Secondary | ICD-10-CM | POA: Insufficient documentation

## 2016-04-23 DIAGNOSIS — K860 Alcohol-induced chronic pancreatitis: Secondary | ICD-10-CM | POA: Diagnosis not present

## 2016-04-23 DIAGNOSIS — G8929 Other chronic pain: Secondary | ICD-10-CM | POA: Insufficient documentation

## 2016-04-23 DIAGNOSIS — R1084 Generalized abdominal pain: Secondary | ICD-10-CM | POA: Insufficient documentation

## 2016-04-23 DIAGNOSIS — R109 Unspecified abdominal pain: Secondary | ICD-10-CM

## 2016-04-23 DIAGNOSIS — Z79899 Other long term (current) drug therapy: Secondary | ICD-10-CM | POA: Insufficient documentation

## 2016-04-23 LAB — COMPREHENSIVE METABOLIC PANEL
ALBUMIN: 4 g/dL (ref 3.5–5.0)
ALK PHOS: 65 U/L (ref 38–126)
ALT: 13 U/L — AB (ref 14–54)
AST: 18 U/L (ref 15–41)
Anion gap: 8 (ref 5–15)
BUN: 10 mg/dL (ref 6–20)
CALCIUM: 9.6 mg/dL (ref 8.9–10.3)
CO2: 24 mmol/L (ref 22–32)
CREATININE: 0.72 mg/dL (ref 0.44–1.00)
Chloride: 105 mmol/L (ref 101–111)
GFR calc Af Amer: >60 mL/min (ref >60)
GFR calc non Af Amer: >60 mL/min (ref >60)
GLUCOSE: 109 mg/dL — AB (ref 65–99)
Potassium: 4.1 mmol/L (ref 3.5–5.1)
Sodium: 137 mmol/L (ref 135–145)
Total Bilirubin: 0.3 mg/dL (ref 0.3–1.2)
Total Protein: 7.3 g/dL (ref 6.5–8.1)

## 2016-04-23 LAB — CBC
HCT: 47.3 % — ABNORMAL HIGH (ref 36.0–46.0)
HEMOGLOBIN: 15.9 g/dL — AB (ref 12.0–15.0)
MCH: 32.4 pg (ref 26.0–34.0)
MCHC: 33.6 g/dL (ref 30.0–36.0)
MCV: 96.5 fL (ref 78.0–100.0)
PLATELETS: 230 10*3/uL (ref 150–400)
RBC: 4.9 MIL/uL (ref 3.87–5.11)
RDW: 12.9 % (ref 11.5–15.5)
WBC: 11.6 10*3/uL — ABNORMAL HIGH (ref 4.0–10.5)

## 2016-04-23 LAB — URINALYSIS, ROUTINE W REFLEX MICROSCOPIC
BILIRUBIN URINE: NEGATIVE
GLUCOSE, UA: NEGATIVE mg/dL
HGB URINE DIPSTICK: NEGATIVE
Ketones, ur: NEGATIVE mg/dL
Leukocytes, UA: NEGATIVE
Nitrite: NEGATIVE
Protein, ur: NEGATIVE mg/dL
SPECIFIC GRAVITY, URINE: 1.019 (ref 1.005–1.030)
pH: 5 (ref 5.0–8.0)

## 2016-04-23 LAB — LIPASE, BLOOD: Lipase: 25 U/L (ref 11–51)

## 2016-04-23 MED ORDER — ONDANSETRON HCL 4 MG/2ML IJ SOLN
4.0000 mg | Freq: Once | INTRAMUSCULAR | Status: AC
  Administered 2016-04-23: 4 mg via INTRAVENOUS
  Filled 2016-04-23: qty 2

## 2016-04-23 MED ORDER — HYDROMORPHONE HCL 2 MG/ML IJ SOLN
1.0000 mg | Freq: Once | INTRAMUSCULAR | Status: AC
  Administered 2016-04-23: 1 mg via INTRAVENOUS
  Filled 2016-04-23: qty 1

## 2016-04-23 MED ORDER — HYDROCODONE-ACETAMINOPHEN 10-325 MG PO TABS: 1.0000 | ORAL_TABLET | Freq: Four times a day (QID) | ORAL | 0 refills | Status: DC | PRN

## 2016-04-23 NOTE — ED Triage Notes (Addendum)
Pt denied SI when asked however she made made comment after triage "If I do not get help today, I may consider suicide. I cannot take this pain anymore."

## 2016-04-23 NOTE — Telephone Encounter (Signed)
2nd request. Pt is currently sitting in ED. Wants to know what to do about pancrease and she would like her pain medication. ep

## 2016-04-23 NOTE — Discharge Instructions (Signed)
Please continue to follow-up with GI regarding further work-up of your abdominal pain.  Please also follow-up with your primary care doctor about ongoing pain management.  Return for worsening symptoms, including fever, intractable vomiting, or any other symptoms concerning to you.

## 2016-04-23 NOTE — ED Notes (Signed)
Pt to nurse station and stating she was going to her PCP office. Pt stated she would return to the ED.

## 2016-04-23 NOTE — ED Triage Notes (Signed)
Pt reports she has hx of blockage in her pancreas. She has had abd pain constantly X2 months. Pt also reports some nausea X1 week. Pt reports she is using pain meds at home. She also reports lack of appetite.

## 2016-04-23 NOTE — ED Provider Notes (Signed)
[facility] DEPT Provider Note   CSN: [id number] Arrival date & time: 04/23/16  1141     History   Chief Complaint Chief Complaint  Patient presents with  . Abdominal Pain    HPI [name] is a 61 y.o. female.  HPI 79  -year-old female who presents with abdominal pain. She has had a chronic right upper and right lower abdominal pain ongoing for the past 2 months. States that she was initially seen at [facility] and subsequently multiple times through [facility] for this abdominal pain. She has had 2 prior CT scans during this time showing chronic pancreatitis and has been told that she may need MRCP. Denies any recent vomiting, diarrhea, melena or hematochezia, fevers or chills. Seen in the family medicine practice clinic today, and sent to ED for admission for further workup of her abdominal pain.  History of cholecystectomy, pancreatectomy for pancreatic cyst. Has Known ventral hernia followed by surgery.  Past Medical History:  Diagnosis Date  . Alcohol abuse    H/o withdrawal   . Anxiety   . Chronic diarrhea   . Depression   . Elevated liver function tests   . GERD (gastroesophageal reflux disease)   . Pancreatitis   . PONV (postoperative nausea and vomiting)   . Rosacea   . Wears glasses     Patient Active Problem List   Diagnosis Date Noted  . Chronic pancreatitis ([place]) 04/18/2016  . Pancreatic calcification 04/03/2016  . Irreducible ventral hernia 04/03/2016  . Abdominal pain, right upper quadrant 04/03/2016  . Establishing care with new doctor, encounter for 04/03/2016  . Major depressive disorder, recurrent, severe without psychotic features ([place])   . Major depression 11/10/2014  . Substance induced mood disorder ([place]) 11/06/2014  . Bilateral knee pain 10/14/2014  . Alcohol use disorder, severe, dependence ([place]) 08/17/2014  . Alcohol abuse   . Suicidal behavior   . Alcohol dependence ([place]) 10/02/2013  . Alcohol withdrawal ([place]) 10/02/2013  . Major  depression, recurrent ([place]) 10/02/2013  . Gastric ulcer 01/20/2012  . Fatty liver 10/11/2011  . 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 Elevated liver function tests   . GERD (gastroesophageal reflux disease)   . ESOPHAGEAL MOTILITY DISORDER 10/01/2008  . HIATAL HERNIA 10/01/2008  . FATTY LIVER DISEASE 10/01/2008    Past Surgical History:  Procedure Laterality Date  . CHOLECYSTECTOMY    . COLONOSCOPY  Never  . DIAGNOSTIC MAMMOGRAM  2008  . ERCP  10/06/2011   Procedure: ENDOSCOPIC RETROGRADE CHOLANGIOPANCREATOGRAPHY (ERCP);  Surgeon: Beryle Beams, MD;  Location: [facility];  Service: Endoscopy;  Laterality: N/A;  . ESOPHAGOGASTRODUODENOSCOPY  03/21/2011   Procedure: ESOPHAGOGASTRODUODENOSCOPY (EGD);  Surgeon: Scarlette Shorts, MD;  Location: [facility] ENDOSCOPY;  Service: Endoscopy;  Laterality: N/A;  Patient may need to be done at bedside tomorrow depending on status  . ESOPHAGOGASTRODUODENOSCOPY  01/06/2012   Procedure: ESOPHAGOGASTRODUODENOSCOPY (EGD);  Surgeon: Wonda Horner, MD;  Location: [facility] ENDOSCOPY;  Service: Endoscopy;  Laterality: N/A;  bedside  . LAPAROTOMY  01/03/2012   Procedure: EXPLORATORY LAPAROTOMY;  Surgeon: Pedro Earls, MD;  Location: [facility] ORS;  Service: General;  Laterality: N/A;  debridement of necrotic pancreas and placement of drains x2  . SHOULDER SURGERY  [id number]  . TUBAL LIGATION      OB History    No data available       Home Medications    Prior to Admission medications   Medication Sig Start Date End Date Taking? Authorizing Provider  dicyclomine (BENTYL) 20  MG tablet Take 1 tablet (20 mg total) by mouth 3 (three) times daily as needed for spasms. Patient not taking: Reported on 04/18/2016 02/27/16 04/18/16  Merlyn Lot, MD  HYDROcodone-acetaminophen [facility]) 10-325 MG tablet Take 1 tablet by mouth every 6 (six) hours as needed. 04/23/16   Forde Dandy, MD  Multiple Vitamin (MULTIVITAMIN WITH MINERALS) TABS tablet Take 1 tablet by mouth daily.    Historical  Provider, MD  ondansetron (ZOFRAN ODT) 4 MG disintegrating tablet Take 1 tablet (4 mg total) by mouth every 8 (eight) hours as needed for nausea or vomiting. Patient not taking: Reported on 04/18/2016 04/02/16   Duffy Bruce, MD  ranitidine (ZANTAC) 150 MG tablet Take 1 tablet (150 mg total) by mouth 2 (two) times daily. Patient not taking: Reported on 04/18/2016 02/27/16 02/26/17  Merlyn Lot, MD  sucralfate (CARAFATE) 1 GM/10ML suspension Take 10 mLs (1 g total) by mouth 4 (four) times daily -  with meals and at bedtime. Patient not taking: Reported on 04/18/2016 04/02/16   Duffy Bruce, MD  valACYclovir (VALTREX) 1000 MG tablet Take 1 tablet (1,000 mg total) by mouth 2 (two) times daily. Patient not taking: Reported on 04/18/2016 04/10/15   Elby Beck, FNP  Zoster Vaccine Live, PF, (ZOSTAVAX) 72536 UNT/0.65ML injection Inject 19,400 Units into the skin once. 10/08/15   Elby Beck, FNP    Family History Family History  Problem Relation Age of Onset  . Stroke Mother   . Heart disease Father   . Heart failure Father   . Heart disease Sister     Social History Social History  Substance Use Topics  . Smoking status: Light Tobacco Smoker    Packs/day: 0.25    Years: 15.00    Types: Cigarettes  . Smokeless tobacco: Never Used  . Alcohol use 1.5 - 2.0 oz/week    3 - 4 Standard drinks or equivalent per week     Allergies   Ciprofloxacin   Review of Systems Review of Systems 10/14 systems reviewed and are negative other than those stated in the HPI   Physical Exam Updated Vital Signs BP 122/57   Pulse 89   Temp 97.9 F (36.6 C) (Oral)   Resp 22   LMP 12/08/2006   SpO2 92%   Physical Exam Physical Exam  Nursing note and vitals reviewed. Constitutional: non-toxic, and in no acute distress Head: Normocephalic and atraumatic.  Mouth/Throat: Oropharynx is clear and moist.  Neck: Normal range of motion. Neck supple.  Cardiovascular: Normal rate and  regular rhythm.   Pulmonary/Chest: Effort normal and breath sounds normal.  Abdominal: Soft. There is RUQ and RLQ and epigastric tenderness. There is no rebound and no guarding.  Musculoskeletal: Normal range of motion.  Neurological: Alert, no facial droop, fluent speech, moves all extremities symmetrically Skin: Skin is warm and dry.  Psychiatric: Cooperative   ED Treatments / Results  Labs (all labs ordered are listed, but only abnormal results are displayed) Labs Reviewed  COMPREHENSIVE METABOLIC PANEL - Abnormal; Notable for the following:       Result Value   Glucose, Bld 109 (*)    ALT 13 (*)    All other components within normal limits  CBC - Abnormal; Notable for the following:    WBC 11.6 (*)    Hemoglobin 15.9 (*)    HCT 47.3 (*)    All other components within normal limits  LIPASE, BLOOD  URINALYSIS, ROUTINE W REFLEX MICROSCOPIC    EKG  EKG Interpretation None       Radiology No results found.  Procedures Procedures (including critical care time)  Medications Ordered in ED Medications  HYDROmorphone (DILAUDID) injection 1 mg (1 mg Intravenous Given 04/23/16 1844)  ondansetron (ZOFRAN) injection 4 mg (4 mg Intravenous Given 04/23/16 1844)     Initial Impression / Assessment and Plan / ED Course  I have reviewed the triage vital signs and the nursing notes.  Pertinent labs & imaging results that were available during my care of the patient were reviewed by me and considered in my medical decision making (see chart for details).  Clinical Course    61 year old female who presents from family medicine clinic for workup of chronic abdominal pain. Records reviewed, and she did have CT scan in early December revealing chronic pancreatitis with postsurgical ductal dilation. Blood work here is reassuring. Pain has been unchanged since onset over the past several months. Discussed with family medicine teaching service as patient was sent from their clinic for  admission. The attending doctor in residents do not feel that she had emergent medical condition that warrants inpatient admission. They did speak with patient here in the emergency department and reviewed that she does have referral to [facility] gastroenterology for workup. The also spoke with gastroenterology here who felt that her workup can also be done as an outpatient that she did not need admission. Family medicine service requested that she be discharged with short-term pain medications until they can follow up with her again in clinic on Monday.  Strict return and follow-up instructions reviewed. She expressed understanding of all discharge instructions and felt comfortable with the plan of care.    Final Clinical Impressions(s) / ED Diagnoses   Final diagnoses:  Chronic abdominal pain    New Prescriptions New Prescriptions   HYDROCODONE-ACETAMINOPHEN (NORCO) 10-325 MG TABLET    Take 1 tablet by mouth every 6 (six) hours as needed.     Forde Dandy, MD 04/23/16 2052

## 2016-04-23 NOTE — Progress Notes (Signed)
   Subjective:    Patient ID: [name], female    DOB: Dec 14, 1955, 61 y.o.   MRN: [id number]   CC: Abdominal pain   HPI: Patient is a 61 yo female with a past medical history significant for chronic pancreatitis most likely secondary to alcohol abuse who presented today for epigastric pain. Patient reports she was in the ED earlier today and waited for 3 hours without any assistance. She decided to come to clinic because her epigastric pain was excruciating and she could not "take it anymore". Patient has been dealing with this pain since early November and was initially seen at the [facility] in [place] where a CT scan was consistent with history of chronic pancreatitis. Patient was recently seen in our clinic on 12/15 and was referred to GI. Since then patient was seen in the ED for similar problems. Patient endorses difficulty eating because of the pain and has been relying on pain medications to help her deal with it. She has lost 15 lb in the past 6 weeks. Patient was recently seen by [facility] who recommended a referral to [place]. Patient has refused this option for insurance reason.  Smoking status reviewed   ROS: all other systems were reviewed and are negative other than in the HPI   Past medical history, surgical, family, and social history reviewed and updated in the EMR as appropriate.  Objective:  BP (!) 140/92 (BP Location: Right Arm, Patient Position: Sitting, Cuff Size: Normal)   Pulse 86   Temp 97.9 F (36.6 C) (Oral)   Ht '5\' 6"'$  (1.676 m)   Wt 147 lb 3.2 oz (66.8 kg)   LMP 12/08/2006   SpO2 96%   BMI 23.76 kg/m   Vitals and nursing note reviewed  General: Patient is in some discomfort, but in NAD, able to participate in exam Cardiac: RRR, normal heart sounds, no murmurs. 2+ radial and PT pulses bilaterally Respiratory: CTAB, normal effort, No wheezes, rales or rhonchi Abdomen: soft,tender to palpation in the epigastric region,  nondistended, no hepatic or splenomegaly, +BS Extremities: no edema or cyanosis. WWP. Skin: warm and dry, no rashes noted Neuro: alert and oriented x4, no focal deficits Psych: patient is upset and have teared up on multiple occasions during the visit   Assessment & Plan:   #Acute on Chronic epigastric pain secondary to pancreatitis Given chronic pain for the past 2 months and limited workup, will admit patient to the family medicine teaching service for further workup of her pancreatitis, pain control and GI consult. Patient will be admitted from the ED. Inpatient service made aware and will admit upon ED admission. It was mentioned in previous notes that patient might need an MRCP for further characterization of pancreatic pain.   Marjie Skiff, MD Family Medicine Resident PGY-1

## 2016-04-23 NOTE — ED Notes (Signed)
Pt moved to a room, attempted to call patient on mobile number, no answer.

## 2016-04-23 NOTE — Telephone Encounter (Signed)
Pt states the referral in place is not for her pancrease and that she needs an urgent referral to see someone about her pancrease because she can't keep going to the ED about it. Pt states she is taking 6 pain pills a day and is still in pain. Pt states she only has 2 pills left and would like a refill on hydrocodone. ep

## 2016-04-23 NOTE — Consult Note (Signed)
Went down to evaluate patient in ED for possible admission.   S: Patient states that she is having abdominal pain. Pain began in her abdomen 2 months ago. Has been gradually worsening but feels at her baseline. Pain flairs up and becomes worse at times after eating. She has had no fever, vomiting, diarrhea. She mentions that ultimately she just wants her pain to go away and that she "can't live like this". Pain medicine helps some but is not enough. She has seen a couple GI specialist but no one is "helping".   O: BP 124/67   Pulse 72   Temp 97.9 F (36.6 C) (Oral)   Resp 13   LMP 12/08/2006   SpO2 94%  Gen: NAD Abd: There is RUQ and RLQ and epigastric tenderness, soft, non-distended  P:  Discussed with ED Brelan Hannen. Plan to discharge patient home as their is no clinical indication to admit her. She is having her chronic pancreatic pain that is at baseline. Clinically she is well-appearing. Vitals are stable. Labs unremarkable and imaging without acute changes. Curbside consulted GI in ED who also stated that they would not do anything inpatient at this time. Requested patient to follow-up as outpatient for continued work-up. Patient has active referral to [facility] from reviewing chart. Encouraged her to follow-up that referral. Will send this note to her PCP for further follow-up. She has appointment tomorrow with general surgery.  Patient given return precautions. She voiced understanding and agreement to above plan.   All of the above discussed with attending, Dr. Andria Frames.   Luiz Blare, DO 04/23/2016, 8:42 PM PGY-3, [place]

## 2016-04-24 ENCOUNTER — Telehealth: Payer: Self-pay | Admitting: Family Medicine

## 2016-04-24 NOTE — Telephone Encounter (Signed)
Patient was seen and established with Dr. [name]) on 04/09/16. She needs to make a follow up appointment with that provider.  I do not feel it is appropriate for a referral to another office at this time as it appears from Dr. Donald Prose note that patient refused all offered treatment/procedures at that appointment.

## 2016-04-24 NOTE — Telephone Encounter (Signed)
Pt states she was told in the ED last night, that an urgent referral for [facility] would be placed today. Pt would like this done as soon as possible. ep

## 2016-04-24 NOTE — Telephone Encounter (Signed)
Patient now requesting pain medication, reports that meds given in ED yesterday is not enough. Patient was informed that MD would likely NOT be prescribing any pain medications and that she needs to make an appointment (fist appointment with PCP in 1/11 and patient thinks this is too long). Patient requesting to speak directly with MD.

## 2016-04-24 NOTE — Telephone Encounter (Signed)
Pt called again and wants Dr. Alease Frame to call her today. ep

## 2016-04-28 NOTE — Telephone Encounter (Signed)
The timeline this patient experienced is a bit confusing in conjunction with this telephone note. I will assume this matter was addressed at the most recent ED visit. If patient calls back stating this is not the case then please let me know as soon as possible.

## 2016-05-01 ENCOUNTER — Telehealth: Payer: Self-pay | Admitting: Family Medicine

## 2016-05-01 MED ORDER — FLUCONAZOLE 150 MG PO TABS: 150.0000 mg | ORAL_TABLET | Freq: Once | ORAL | 0 refills | Status: AC

## 2016-05-01 NOTE — Telephone Encounter (Signed)
Family Medicine After hours phone call  Patient is calling with symptoms consistent with a yeast infection. She states that she has been having these symptoms over the past few days. Symptoms include vaginal itching, burning, and discharge. She states that she has had yeast infections in the past but has not had an infection in multiple years. That said, she states that she has had enough that she knows what the symptoms are like. She states that she has had good tolerance and response to a 1 time tablet in the past. She is not aware of the name of this medication but states that she only had to take a single tablet. She denies systemic symptoms at this time. She denies any vaginal bleeding. No fevers.  Review of patient's most recent labs yielded no evidence of transaminitis or hyperbilirubinemia.  Prescription for Diflucan provided. And patient was asked to follow-up with PCP. Patient stated her understanding and had no further questions.  Elberta Leatherwood, MD,MS,  PGY3 05/01/2016 6:27 PM

## 2016-05-01 NOTE — Telephone Encounter (Signed)
Has yeast infection. Would like oral pill called in. [facility] on [address].

## 2016-05-06 NOTE — Telephone Encounter (Signed)
Already addressed

## 2016-05-08 ENCOUNTER — Telehealth: Payer: Self-pay | Admitting: Family Medicine

## 2016-05-08 ENCOUNTER — Encounter (HOSPITAL_COMMUNITY): Payer: Self-pay | Admitting: Emergency Medicine

## 2016-05-08 ENCOUNTER — Emergency Department (HOSPITAL_COMMUNITY): Payer: BLUE CROSS/BLUE SHIELD

## 2016-05-08 ENCOUNTER — Emergency Department (HOSPITAL_COMMUNITY)
Admission: EM | Admit: 2016-05-08 | Discharge: 2016-05-09 | Disposition: A | Discharge status: Home / Self Care | Payer: BLUE CROSS/BLUE SHIELD | Attending: Emergency Medicine | Admitting: Emergency Medicine

## 2016-05-08 DIAGNOSIS — F17210 Nicotine dependence, cigarettes, uncomplicated: Secondary | ICD-10-CM | POA: Insufficient documentation

## 2016-05-08 DIAGNOSIS — M546 Pain in thoracic spine: Secondary | ICD-10-CM | POA: Insufficient documentation

## 2016-05-08 DIAGNOSIS — K432 Incisional hernia without obstruction or gangrene: Secondary | ICD-10-CM

## 2016-05-08 DIAGNOSIS — Z79899 Other long term (current) drug therapy: Secondary | ICD-10-CM | POA: Diagnosis not present

## 2016-05-08 DIAGNOSIS — R1033 Periumbilical pain: Secondary | ICD-10-CM

## 2016-05-08 DIAGNOSIS — K439 Ventral hernia without obstruction or gangrene: Secondary | ICD-10-CM | POA: Diagnosis not present

## 2016-05-08 LAB — COMPREHENSIVE METABOLIC PANEL
ALK PHOS: 55 U/L (ref 38–126)
ALT: 15 U/L (ref 14–54)
ANION GAP: 12 (ref 5–15)
AST: 18 U/L (ref 15–41)
Albumin: 3.8 g/dL (ref 3.5–5.0)
BUN: 13 mg/dL (ref 6–20)
CALCIUM: 9.5 mg/dL (ref 8.9–10.3)
CHLORIDE: 106 mmol/L (ref 101–111)
CO2: 21 mmol/L — AB (ref 22–32)
CREATININE: 0.67 mg/dL (ref 0.44–1.00)
GFR calc non Af Amer: >60 mL/min (ref >60)
Glucose, Bld: 100 mg/dL — ABNORMAL HIGH (ref 65–99)
Potassium: 3.7 mmol/L (ref 3.5–5.1)
SODIUM: 139 mmol/L (ref 135–145)
Total Bilirubin: 0.4 mg/dL (ref 0.3–1.2)
Total Protein: 6.8 g/dL (ref 6.5–8.1)

## 2016-05-08 LAB — CBC WITH DIFFERENTIAL/PLATELET
Basophils Absolute: 0 10*3/uL (ref 0.0–0.1)
Basophils Relative: 0 %
EOS ABS: 0.3 10*3/uL (ref 0.0–0.7)
EOS PCT: 3 %
HCT: 42.8 % (ref 36.0–46.0)
Hemoglobin: 14.2 g/dL (ref 12.0–15.0)
LYMPHS ABS: 3.4 10*3/uL (ref 0.7–4.0)
LYMPHS PCT: 34 %
MCH: 31.5 pg (ref 26.0–34.0)
MCHC: 33.2 g/dL (ref 30.0–36.0)
MCV: 94.9 fL (ref 78.0–100.0)
MONOS PCT: 9 %
Monocytes Absolute: 0.9 10*3/uL (ref 0.1–1.0)
Neutro Abs: 5.5 10*3/uL (ref 1.7–7.7)
Neutrophils Relative %: 54 %
PLATELETS: 268 10*3/uL (ref 150–400)
RBC: 4.51 MIL/uL (ref 3.87–5.11)
RDW: 12.7 % (ref 11.5–15.5)
WBC: 10 10*3/uL (ref 4.0–10.5)

## 2016-05-08 LAB — LIPASE, BLOOD: LIPASE: 25 U/L (ref 11–51)

## 2016-05-08 MED ORDER — OXYCODONE-ACETAMINOPHEN 5-325 MG PO TABS
ORAL_TABLET | ORAL | Status: AC
  Administered 2016-05-08: 1
  Filled 2016-05-08: qty 1

## 2016-05-08 MED ORDER — DIAZEPAM 5 MG PO TABS
5.0000 mg | ORAL_TABLET | Freq: Once | ORAL | Status: AC
  Administered 2016-05-08: 5 mg via ORAL
  Filled 2016-05-08: qty 1

## 2016-05-08 MED ORDER — OXYCODONE HCL 5 MG PO TABS
5.0000 mg | ORAL_TABLET | Freq: Once | ORAL | Status: AC
  Administered 2016-05-08: 5 mg via ORAL
  Filled 2016-05-08: qty 1

## 2016-05-08 MED ORDER — OXYCODONE-ACETAMINOPHEN 5-325 MG PO TABS: 1.0000 | ORAL_TABLET | ORAL | Status: DC | PRN

## 2016-05-08 NOTE — ED Notes (Signed)
ED Provider at bedside. 

## 2016-05-08 NOTE — ED Triage Notes (Signed)
Pt reports feeling like she was punched in the abdomen today while out shopping today. Pt with large protruding hernia, tearful in triage. VSS.

## 2016-05-08 NOTE — Telephone Encounter (Signed)
Pt has been told by Gaspar Cola she has a stone so large in her pancreas that she is being sent to [facility] in [place].  She is in tremendous pain.  She would like to have some type of pain meds.  She is taking gabepentin but it isnt helping.  Please advise

## 2016-05-08 NOTE — ED Notes (Signed)
Pt. A&O stating that her hernia presented today. While PA was at bedside teaching was done for how to put hernia back in. When this writer was at bedside pt coughed and hernia popped out again. Pt was able to push hernia back in. Pt also c/o pain in abdomen from know "stone in pancreas".

## 2016-05-08 NOTE — ED Provider Notes (Signed)
[facility] DEPT Provider Note   CSN: [id number] Arrival date & time: 05/08/16  1818     History   Chief Complaint Chief Complaint  Patient presents with  . Hernia    HPI Artis Buechele is a 61 y.o. female with a hx of EtOH abuse, anxiety, chronic diarrhea, GERD, pancreatits presents to the Emergency Department With multiple complaints. Her initial complaint was acute onset periumbilical abdominal pain. Patient reports she has a known ventral hernia but spent the day bending over and heard it pop causing significant pain. No treatments prior to arrival. Patient reports she was supposed to have the hernia repaired previously but was more concerned about her pancreatitis and did not follow-up as directed.  Patient denies fevers or chills, nausea or vomiting. No constipation or diarrhea.  Patient also complaining of upper thoracic back pain. She reports it happens almost every day and is bilateral in nature. She takes ibuprofen without relief. Patient denies falls or known trauma. She denies IV drug use or history of cancer.  Patient denies overt flank pain. She does endorse some foul-smelling urine but denies dysuria or hematuria.   The history is provided by the patient and medical records. No language interpreter was used.    Past Medical History:  Diagnosis Date  . Alcohol abuse    H/o withdrawal   . Anxiety   . Chronic diarrhea   . Depression   . Elevated liver function tests   . GERD (gastroesophageal reflux disease)   . Pancreatitis   . PONV (postoperative nausea and vomiting)   . Rosacea   . Wears glasses     Patient Active Problem List   Diagnosis Date Noted  . Chronic pancreatitis ([place]) 04/18/2016  . Pancreatic calcification 04/03/2016  . Irreducible ventral hernia 04/03/2016  . Abdominal pain, right upper quadrant 04/03/2016  . Establishing care with new doctor, encounter for 04/03/2016  . Major depressive disorder, recurrent, severe without psychotic  features ([place])   . Major depression 11/10/2014  . Substance induced mood disorder ([place]) 11/06/2014  . Bilateral knee pain 10/14/2014  . Alcohol use disorder, severe, dependence ([place]) 08/17/2014  . Alcohol abuse   . Suicidal behavior   . Alcohol dependence ([place]) 10/02/2013  . Alcohol withdrawal ([place]) 10/02/2013  . Major depression, recurrent ([place]) 10/02/2013  . Gastric ulcer 01/20/2012  . Fatty liver 10/11/2011  . 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 Elevated liver function tests   . GERD (gastroesophageal reflux disease)   . ESOPHAGEAL MOTILITY DISORDER 10/01/2008  . HIATAL HERNIA 10/01/2008  . FATTY LIVER DISEASE 10/01/2008    Past Surgical History:  Procedure Laterality Date  . CHOLECYSTECTOMY    . COLONOSCOPY  Never  . DIAGNOSTIC MAMMOGRAM  2008  . ERCP  10/06/2011   Procedure: ENDOSCOPIC RETROGRADE CHOLANGIOPANCREATOGRAPHY (ERCP);  Surgeon: Beryle Beams, MD;  Location: [facility];  Service: Endoscopy;  Laterality: N/A;  . ESOPHAGOGASTRODUODENOSCOPY  03/21/2011   Procedure: ESOPHAGOGASTRODUODENOSCOPY (EGD);  Surgeon: Scarlette Shorts, MD;  Location: [name] ENDOSCOPY;  Service: Endoscopy;  Laterality: N/A;  Patient may need to be done at bedside tomorrow depending on status  . ESOPHAGOGASTRODUODENOSCOPY  01/06/2012   Procedure: ESOPHAGOGASTRODUODENOSCOPY (EGD);  Surgeon: Wonda Horner, MD;  Location: [facility];  Service: Endoscopy;  Laterality: N/A;  bedside  . LAPAROTOMY  01/03/2012   Procedure: EXPLORATORY LAPAROTOMY;  Surgeon: Pedro Earls, MD;  Location: [facility] ORS;  Service: General;  Laterality: N/A;  debridement of necrotic pancreas and placement of drains x2  . SHOULDER  SURGERY  [id number]  . TUBAL LIGATION      OB History    No data available       Home Medications    Prior to Admission medications   Medication Sig Start Date End Date Taking? Authorizing Provider  dicyclomine (BENTYL) 20 MG tablet Take 1 tablet (20 mg total) by mouth 3 (three) times daily as needed  for spasms. Patient not taking: Reported on 04/18/2016 02/27/16 04/18/16  Merlyn Lot, MD  HYDROcodone-acetaminophen (NORCO/VICODIN) 5-325 MG tablet Take 1 tablet by mouth every 6 (six) hours as needed for moderate pain or severe pain. 05/09/16   Codi Kertz, PA-C  methocarbamol (ROBAXIN) 500 MG tablet Take 1 tablet (500 mg total) by mouth 2 (two) times daily. 05/09/16   Chesney Suares, PA-C  Multiple Vitamin (MULTIVITAMIN WITH MINERALS) TABS tablet Take 1 tablet by mouth daily.    Historical Provider, MD  ondansetron (ZOFRAN ODT) 4 MG disintegrating tablet Take 1 tablet (4 mg total) by mouth every 8 (eight) hours as needed for nausea or vomiting. Patient not taking: Reported on 04/18/2016 04/02/16   Duffy Bruce, MD  ranitidine (ZANTAC) 150 MG tablet Take 1 tablet (150 mg total) by mouth 2 (two) times daily. Patient not taking: Reported on 04/18/2016 02/27/16 02/26/17  Merlyn Lot, MD  sucralfate (CARAFATE) 1 GM/10ML suspension Take 10 mLs (1 g total) by mouth 4 (four) times daily -  with meals and at bedtime. Patient not taking: Reported on 04/18/2016 04/02/16   Duffy Bruce, MD  Zoster Vaccine Live, PF, (ZOSTAVAX) 13244 UNT/0.65ML injection Inject 19,400 Units into the skin once. 10/08/15   Elby Beck, FNP    Family History Family History  Problem Relation Age of Onset  . Stroke Mother   . Heart disease Father   . Heart failure Father   . Heart disease Sister     Social History Social History  Substance Use Topics  . Smoking status: Light Tobacco Smoker    Packs/day: 0.25    Years: 15.00    Types: Cigarettes  . Smokeless tobacco: Never Used  . Alcohol use 1.5 - 2.0 oz/week    3 - 4 Standard drinks or equivalent per week     Comment: quit 2016     Allergies   Ciprofloxacin   Review of Systems Review of Systems  Gastrointestinal: Positive for abdominal pain. Negative for nausea and vomiting.  Musculoskeletal: Positive for back pain ( Thoracic).   All other systems reviewed and are negative.    Physical Exam Updated Vital Signs BP 118/73   Pulse 75   Temp 97.9 F (36.6 C) (Oral)   Resp 16   LMP 12/08/2006   SpO2 99%   Physical Exam  Constitutional: She appears well-developed and well-nourished. No distress.  Awake, alert, nontoxic appearance  HENT:  Head: Normocephalic and atraumatic.  Mouth/Throat: Oropharynx is clear and moist. No oropharyngeal exudate.  Eyes: Conjunctivae are normal. No scleral icterus.  Neck: Normal range of motion. Neck supple.  Full ROM without pain  Cardiovascular: Normal rate, regular rhythm and intact distal pulses.   Pulmonary/Chest: Effort normal and breath sounds normal. No respiratory distress. She has no wheezes.  Equal chest expansion  Abdominal: Soft. Bowel sounds are normal. She exhibits no distension and no mass. There is tenderness in the periumbilical area. There is no rebound and no guarding.  Large, well healed midline incision with firm palpable hernia just lateral to the umbilicus  Musculoskeletal: Normal range of motion. She exhibits no  edema.  Full range of motion of the T-spine and L-spine No midline tenderness to the  T-spine or L-spine Tenderness to palpation of the paraspinous muscles of the Mid T--spine No tenderness to palpation of the bilateral paraspinal muscles of the L-spine  Lymphadenopathy:    She has no cervical adenopathy.  Neurological: She is alert.  Speech is clear and goal oriented Moves extremities without ataxia Strength 5/5 in the bilateral upper and lower extremities Sensation intact to normal touch throughout upper and lower extremities Normal gait  Skin: Skin is warm and dry. No rash noted. She is not diaphoretic. No erythema.  Psychiatric: She has a normal mood and affect. Her behavior is normal.  Nursing note and vitals reviewed.    ED Treatments / Results  Labs (all labs ordered are listed, but only abnormal results are displayed) Labs  Reviewed  COMPREHENSIVE METABOLIC PANEL - Abnormal; Notable for the following:       Result Value   CO2 21 (*)    Glucose, Bld 100 (*)    All other components within normal limits  URINALYSIS, ROUTINE W REFLEX MICROSCOPIC - Abnormal; Notable for the following:    Color, Urine STRAW (*)    All other components within normal limits  CBC WITH DIFFERENTIAL/PLATELET  LIPASE, BLOOD    Radiology Dg Thoracic Spine 2 View  Result Date: 05/08/2016 CLINICAL DATA:  Mid back pain for months.  No known injury. EXAM: THORACIC SPINE 2 VIEWS COMPARISON:  No dedicated thoracic spine imaging. Chest radiographs 08/01/2014 FINDINGS: Minimal upper thoracic dextroscoliosis. Alignment is otherwise maintained. Vertebral body heights are maintained. No evidence of fracture. Mild disc space narrowing and endplate spurring in the mid lower thoracic spine. Posterior elements appear intact. There is no paravertebral soft tissue abnormality. There is a hiatal hernia. 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 No radiographic evidence of acute abnormality. Electronically Signed   By: Jeb Levering M.D.   On: 05/08/2016 23:21    Procedures Hernia reduction Date/Time: 05/08/2016 10:37 PM Performed by: Abigail Butts Authorized by: Abigail Butts  Consent: Verbal consent obtained. Risks and benefits: risks, benefits and alternatives were discussed Consent given by: patient Patient understanding: patient states understanding of the procedure being performed Patient consent: the patient's understanding of the procedure matches consent given Site marked: the operative site was marked Imaging studies: imaging studies available (previous CT) Required items: required blood products, implants, devices, and special equipment available Patient identity confirmed: verbally with patient Time out: Immediately prior to procedure a "time out" was called to verify the correct patient, procedure,  equipment, support staff and site/side marked as required. Local anesthesia used: no  Anesthesia: Local anesthesia used: no  Sedation: Patient sedated: no Patient tolerance: Patient tolerated the procedure well with no immediate complications Comments: Complete reduction of ventral hernia just left lateral of the umbilicus    (including critical care time)  Medications Ordered in ED Medications  oxyCODONE-acetaminophen (PERCOCET/ROXICET) 5-325 MG per tablet 1 tablet (not administered)  oxyCODONE-acetaminophen (PERCOCET/ROXICET) 5-325 MG per tablet (1 tablet  Given 05/08/16 1857)  oxyCODONE (Oxy IR/ROXICODONE) immediate release tablet 5 mg (5 mg Oral Given 05/08/16 2330)  diazepam (VALIUM) tablet 5 mg (5 mg Oral Given 05/08/16 2330)     Initial Impression / Assessment and Plan / ED Course  I have reviewed the triage vital signs and the nursing notes.  Pertinent labs & imaging results that were available during my care of the patient were reviewed by me and considered  in my medical decision making (see chart for details).  Clinical Course as of May 09 302  Sat May 09, 2016  0237 [place] narcotic database accessed.  Pt has received hydrocodone prescriptions on 02/27/2016, 04/02/2016, 04/03/2016, 04/15/2016 04/21/2016 04/24/2016 all from different providers.  No prescriptions prior to November 2017.  Last prescription was a quantity of 15.  Patient will be given 5 tablets today and long discussion about need for chronic pain control from only 1 provider. This emergency department will not prescribe any additional pain control after today.  [HM]    Clinical Course User Index [HM] Abigail Butts, PA-C    Patient presents with several complaints. She initially comes for abdominal pain. Ventral hernia was able to be reduced. She's had numerous CT scans in the past and they do not feel that CT scan today will benefit. Lab work is reassuring without evidence of pancreatitis or elevated liver  enzymes. No epigastric or right upper quadrant abdominal pain. No nausea or vomiting.  She also with T-spine pain and complaints of foul-smelling urine. Plain films of the T-spine are without lesions or fractures. UA without evidence of urinary tract infection. Pain is well controlled here in the emergency department.  She reports she is out of her pain control at home. I will write for 2 days of pain control until patient can be seen by her primary care physician on Monday. I long conversation with patient about my concern that she continues to return to the emergency department for pain control. From this point for she will need to obtain any further pain control from her primary care or pain management specialist. I informed her that the emergency department will no longer write for pain control. Patient states understanding and is in agreement with the plan.  Final Clinical Impressions(s) / ED Diagnoses   Final diagnoses:  Periumbilical abdominal pain  Ventral hernia, recurrent  Bilateral thoracic back pain, unspecified chronicity    New Prescriptions Discharge Medication List as of 05/09/2016  2:42 AM    START taking these medications   Details  HYDROcodone-acetaminophen (NORCO/VICODIN) 5-325 MG tablet Take 1 tablet by mouth every 6 (six) hours as needed for moderate pain or severe pain., Starting Sat 05/09/2016, Print    methocarbamol (ROBAXIN) 500 MG tablet Take 1 tablet (500 mg total) by mouth 2 (two) times daily., Starting Sat 05/09/2016, [employer] Karis Emig, PA-C 05/09/16 [place], MD 05/11/16 [phone]

## 2016-05-09 LAB — URINALYSIS, ROUTINE W REFLEX MICROSCOPIC
Bilirubin Urine: NEGATIVE
GLUCOSE, UA: NEGATIVE mg/dL
Hgb urine dipstick: NEGATIVE
KETONES UR: NEGATIVE mg/dL
LEUKOCYTES UA: NEGATIVE
NITRITE: NEGATIVE
PH: 6 (ref 5.0–8.0)
Protein, ur: NEGATIVE mg/dL
Specific Gravity, Urine: 1.005 (ref 1.005–1.030)

## 2016-05-09 MED ORDER — METHOCARBAMOL 500 MG PO TABS: 500.0000 mg | ORAL_TABLET | Freq: Two times a day (BID) | ORAL | 0 refills | Status: DC

## 2016-05-09 MED ORDER — HYDROCODONE-ACETAMINOPHEN 5-325 MG PO TABS: 1.0000 | ORAL_TABLET | Freq: Four times a day (QID) | ORAL | 0 refills | Status: DC | PRN

## 2016-05-09 NOTE — Discharge Instructions (Signed)
1. Medications: Robaxin, vicodin, usual home medications 2. Treatment: rest, drink plenty of fluids, advance diet slowly 3. Follow Up: Please followup with your primary doctor in 2 days for discussion of your diagnoses and further evaluation after today's visit; if you do not have a primary care doctor use the resource guide provided to find one; Please return to the ER for persistent vomiting, high fevers or worsening symptoms

## 2016-05-11 ENCOUNTER — Telehealth: Payer: Self-pay | Admitting: *Deleted

## 2016-05-11 NOTE — Telephone Encounter (Signed)
I called patient back. I informed her of my concern with regular pain medication prescriptions. I also informed her that due to her recent establishment in our clinic there was some obvious hesitations on my side regarding any regular prescriptions for pain medication. These hesitations are only made stronger knowing that she has dealt with substance abuse in the past (alcohol).  At the same time, I acknowledge the fact that patient has a real source for pain. I discussed with her the fact that now that she has establish in our clinic she needs to be better about using office visits as a way to follow/track her pain, rather than the ED.  I have asked patient to schedule an appointment in our clinic. I informed her that unfortunately my time in the clinic is significantly limited during the month of January and that this may have to be an appointment with one of my colleagues if I'm not available. My hope is that if she does see one of my colleagues she could be reassessed regarding her pain and at that time here she may be able to make their own decision of whether or not she warrants a prescription for pain medication (rather than just refilling my previous prescription), as I have only truly assessed her pain once in the past. If he/she believe she does warrant pain medication than they have my absolute approval to write for this as they seemed fit. I informed patient that I could not guarantee that a office visit with one of my colleagues would produce a prescription for any pain medications--and patient stated her understanding.  If patient is to be provided a prescription for pain medication I ask that a UDS be obtained.

## 2016-05-11 NOTE — Telephone Encounter (Signed)
Pt calling again wanting to talk to dr Alease Frame about pain medication. Tried to offer her an appt and she declined. [place] Stockley Kennon Holter, CMA

## 2016-05-11 NOTE — Telephone Encounter (Signed)
Pt is calling again about pain meds. Pt is very angry. Pt wants PCP to call her. ep

## 2016-05-12 ENCOUNTER — Ambulatory Visit (INDEPENDENT_AMBULATORY_CARE_PROVIDER_SITE_OTHER): Payer: Self-pay | Admitting: Family Medicine

## 2016-05-12 VITALS — BP 110/66 | HR 72 | Temp 98.0°F | Wt 143.6 lb

## 2016-05-12 DIAGNOSIS — K861 Other chronic pancreatitis: Secondary | ICD-10-CM

## 2016-05-12 DIAGNOSIS — K439 Ventral hernia without obstruction or gangrene: Secondary | ICD-10-CM

## 2016-05-12 DIAGNOSIS — R1011 Right upper quadrant pain: Secondary | ICD-10-CM

## 2016-05-12 MED ORDER — HYDROCODONE-ACETAMINOPHEN 5-325 MG PO TABS: 1.0000 | ORAL_TABLET | Freq: Four times a day (QID) | ORAL | 0 refills | Status: DC | PRN

## 2016-05-12 MED FILL — HYDROCODON-APAP 5-325: 5-325 | 10 days supply | Qty: 40 | Fill #0

## 2016-05-12 NOTE — Progress Notes (Signed)
Subjective: CC: pain HPI: Patient is a 61 y.o. female with a past medical history of severe pancreatitis in 8841 complicated by pseudocyst and peritonitis presenting for abdominal pain.   She began with recurrent upper abdominal pain in 02/2016 and CT scan performed at the OSH in 03/2016 with stable biliary dilatation status postcholecystectomy, common hepatic duct measures up to 40m, no evidence of choledocholithiasis, numerous calcifications noted within the pancreatic head, pancreatic ductal dilatation within the pancreatic body and tail (measuring up to 9 mm in diameter), and possible intraductal calculi.   She was last seen by gastroenterology on January 10. 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 If patient's PD stone/s are not amenable to endoscopic therapy the plan would be to refer to pancreatic surgeon, Dr. CGrant Fontanafor consideration of surgical treatment of chronic pancreatitis. Unfortunately she was not able to get a EUS/ERCP due to the snow last week.  The patient states that since that time, there is been a plan [place] I [place] not see any phone reports about this.  She notes she cannot eat; she survives on Boost, egg whites, and protein cookies. She notes significant pain with eating. Today she's eaten a egg white, half cup of yogurt and Boost. She's drinking water and prune juice intermittently. The pain she is having is mostly in the RUQ and sometimes goes towards the back.  She notes the only thing the GI doctor could prescribe her is gabapentin which hasn't been helping. She received a small dose of Norco from the ED on 1/20 (at that time she had ventral hernia that required reduction). She has been able to reduce  the hernia since that time.  [facility].   Social History: current smoker  Flu Vaccine: up to date    ROS: All other systems reviewed and are negative.  Past Medical History Patient Active Problem List   Diagnosis Date Noted  . Chronic pancreatitis ([place] 04/18/2016  . Pancreatic calcification 04/03/2016  . Ventral hernia 04/03/2016  . Abdominal pain, right upper quadrant 04/03/2016  . Establishing care with new doctor, encounter for 04/03/2016  . Major depressive disorder, recurrent, severe without psychotic features ([place]   . Major depression 11/10/2014  . Substance induced mood disorder ([place] 11/06/2014  . Bilateral knee pain 10/14/2014  . Alcohol use disorder, severe, dependence ([place] 08/17/2014  . Alcohol abuse   . Suicidal behavior   . Alcohol dependence ([place] 10/02/2013  . Alcohol withdrawal ([place] 10/02/2013  . Major depression, recurrent ([place] 10/02/2013  . Gastric ulcer 01/20/2012  . Fatty liver 10/11/2011  . 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 Elevated liver function tests   . GERD (gastroesophageal reflux disease)   . ESOPHAGEAL MOTILITY DISORDER 10/01/2008  . HIATAL HERNIA 10/01/2008  . FATTY LIVER DISEASE 10/01/2008    Medications- reviewed and updated  Objective: Office vital signs reviewed. BP 110/66 (BP Location: Left Arm, Patient Position: Sitting, Cuff Size: Large)   Pulse 72   Temp 98 F (36.7 C) (Oral)   Wt 143 lb 9.6 oz (65.1 kg)   LMP 12/08/2006   SpO2 99%   BMI 23.18 kg/m    Physical Examination:  General: Awake, alert, well- nourished, NAD Cardio: RRR, no m/r/g noted. Brisk capillary refill. Pulm: No increased WOB.  CTAB, without wheezes, rhonchi or crackles noted.  GI: Normoactive bowel sounds. Abdomen soft, nondistended. Several well-healed surgical scars. Tenderness diffusely, but most prominent in the right upper quadrant. No rebound or guarding. Easy reducible ventral  hernia.  Assessment/Plan: Ventral hernia Reducible on exam today. Patient aware of how to reduce this hernia and understand return precautions if she is unable to reduce it.  Abdominal pain, right upper quadrant I suspect this is all secondary to the patient's chronic pancreatitis and pancreatic calcifications with possible stricture. I discussed importance of attempting to stay well-hydrated of possible. We discussed going to a clear liquid diet given her worsening pain. At this time she is able to stay well-hydrated in the outpatient setting. From reviewing the gastroenterology note, they were going to try each while of gabapentin and if there was no improvement, would refer to pain management. -I will provide a Rx for Norco, 40 tablets to last 2 weeks. I discussed that there would be no refills on this prior to her being evaluated again and she needs to follow-up with her primary care doctor in 1-2 weeks (he has appts available) so that he can determine the best course of action for her pain control. -Clear liquid diet and advance as tolerated. -Continue to follow-up with GI -I have obtained a UDS to help her PCP with further management/decision-making. -Return precautions discussed.   Orders Placed This Encounter  Procedures  . Pain Mgmt, Profile 5 w/o medMatch U    Meds ordered this encounter  Medications  . DISCONTD: HYDROcodone-acetaminophen (NORCO/VICODIN) 5-325 MG tablet    Sig: Take 1 tablet by mouth every 6 (six) hours as needed for moderate pain or severe pain.    Dispense:  30 tablet    Refill:  0  . HYDROcodone-acetaminophen (NORCO/VICODIN) 5-325 MG tablet    Sig: Take 1 tablet by mouth every 6 (six) hours as needed for moderate pain or severe pain.    Dispense:  40 tablet    Refill:  [place] PGY-3, [place]

## 2016-05-12 NOTE — Patient Instructions (Signed)
Please schedule an appointment with your PCP within 1-2 weeks. Go back to a clear liquid diet until your pain improves. As your pain improves, you can advance your diet as tolerated. If you note worsening pain that does not improve, fevers, you are unable to keep liquids down, you note decreased urine output, or you cannot reduce your hernia, please seek care immediately.

## 2016-05-13 NOTE — Assessment & Plan Note (Signed)
I suspect this is all secondary to the patient's chronic pancreatitis and pancreatic calcifications with possible stricture. I discussed importance of attempting to stay well-hydrated of possible. We discussed going to a clear liquid diet given her worsening pain. At this time she is able to stay well-hydrated in the outpatient setting. From reviewing the gastroenterology note, they were going to try each while of gabapentin and if there was no improvement, would refer to pain management. -I will provide a Rx for Norco, 40 tablets to last 2 weeks. I discussed that there would be no refills on this prior to her being evaluated again and she needs to follow-up with her primary care doctor in 1-2 weeks (he has appts available) so that he can determine the best course of action for her pain control. -Clear liquid diet and advance as tolerated. -Continue to follow-up with GI -I have obtained a UDS to help her PCP with further management/decision-making. -Return precautions discussed.

## 2016-05-13 NOTE — Assessment & Plan Note (Signed)
Reducible on exam today. Patient aware of how to reduce this hernia and understand return precautions if she is unable to reduce it.

## 2016-05-15 LAB — PAIN MGMT, PROFILE 5 W/O MEDMATCH U
AMINOCLONAZEPAM: NEGATIVE ng/mL (ref <25)
AMPHETAMINES: NEGATIVE ng/mL (ref <500)
Alphahydroxyalprazolam: NEGATIVE ng/mL (ref <25)
Alphahydroxymidazolam: NEGATIVE ng/mL (ref <50)
Alphahydroxytriazolam: NEGATIVE ng/mL (ref <50)
BARBITURATES: NEGATIVE ng/mL (ref <300)
Benzodiazepines: POSITIVE ng/mL — AB (ref <100)
Cocaine Metabolite: NEGATIVE ng/mL (ref <150)
Creatinine: 57.1 mg/dL (ref >=20.0)
HYDROXYETHYLFLURAZEPAM: NEGATIVE ng/mL (ref <50)
Lorazepam: NEGATIVE ng/mL (ref <50)
Marijuana Metabolite: 10 ng/mL — ABNORMAL HIGH (ref <5)
Marijuana Metabolite: POSITIVE ng/mL — AB (ref <20)
Methadone Metabolite: NEGATIVE ng/mL (ref <100)
NORDIAZEPAM: NEGATIVE ng/mL (ref <50)
OPIATES: NEGATIVE ng/mL (ref <100)
OXAZEPAM: 79 ng/mL — AB (ref <50)
OXYCODONE: NEGATIVE ng/mL (ref <100)
Oxidant: NEGATIVE ug/mL (ref <200)
Temazepam: 77 ng/mL — ABNORMAL HIGH (ref <50)
pH: 6.33 (ref 4.5–9.0)

## 2016-05-20 ENCOUNTER — Encounter: Payer: Self-pay | Admitting: Family Medicine

## 2016-05-20 ENCOUNTER — Ambulatory Visit (INDEPENDENT_AMBULATORY_CARE_PROVIDER_SITE_OTHER): Payer: BLUE CROSS/BLUE SHIELD | Admitting: Family Medicine

## 2016-05-20 VITALS — BP 120/68 | HR 69 | Temp 98.2°F | Ht 66.0 in | Wt 143.4 lb

## 2016-05-20 DIAGNOSIS — K439 Ventral hernia without obstruction or gangrene: Secondary | ICD-10-CM | POA: Diagnosis not present

## 2016-05-20 DIAGNOSIS — F332 Major depressive disorder, recurrent severe without psychotic features: Secondary | ICD-10-CM | POA: Diagnosis not present

## 2016-05-20 DIAGNOSIS — R1013 Epigastric pain: Secondary | ICD-10-CM

## 2016-05-20 DIAGNOSIS — R1011 Right upper quadrant pain: Secondary | ICD-10-CM

## 2016-05-20 DIAGNOSIS — G8929 Other chronic pain: Secondary | ICD-10-CM

## 2016-05-20 MED ORDER — HYDROCODONE-ACETAMINOPHEN 5-325 MG PO TABS: 1.0000 | ORAL_TABLET | Freq: Four times a day (QID) | ORAL | 0 refills | Status: DC | PRN

## 2016-05-20 MED ORDER — AMITRIPTYLINE HCL 25 MG PO TABS: 25.0000 mg | ORAL_TABLET | Freq: Every day | ORAL | 1 refills | Status: DC

## 2016-05-20 NOTE — Assessment & Plan Note (Signed)
Stable: Patient has appointment with general surgery on 05/25/16. - Follow-up general surgery recommendations.

## 2016-05-20 NOTE — Assessment & Plan Note (Signed)
Patient endorsed some feelings of depression and unhappiness. Also had some complaints of insomnia. We discussed this at length. No SI/HI. - Initiate amitriptyline at night. (This medication has evidence behind its use in patients with insomnia, chronic pain, and depression) - We'll titrate dosage up as necessary. - Follow-up in 4-6 weeks; informed patient that no refills will be provided until follow-up.

## 2016-05-20 NOTE — Patient Instructions (Signed)
It was a pleasure seeing you today in our clinic. Today we discussed your abdominal pain. Here is the treatment plan we have discussed and agreed upon together:   - I have provided you with an additional prescription for hydrocodone 5/325. As discussed in clinic today this will be the last prescription for pain medication regarding your abdominal pain that you'll be receiving for me (due to reasons discussed during our office visit). - I have placed a referral to the pain clinic. You'll be contacted to set up an appointment with them. - I have started you on amitriptyline. This is an antidepressant that is often used to help with sleep and chronic pain. I would like to have you follow-up with me in one month to see how this new medication is affecting

## 2016-05-20 NOTE — Progress Notes (Signed)
HPI  CC: Follow-up on abdominal pain Patient is here for follow-up on her abdominal pain. She states that her pain has been persistent but under relatively good control. She was seen by my colleague earlier this month who provided a 2 week supply of Norco. She states that she still has approximately a dozen tablets left. She has been seen by GI at [facility]. According to the patient may have tried to refer her to a specialist at [facility] who has access to a specific procedure she may benefit from. She does not know when she will hear back about this possible appointment but is optimistic that they will take her on as a patient.  Patient also has an appointment to discuss her ventral hernia with general surgery on 05/25/16. Patient plans on making this appointment.  The majority of our time we discussed the plan for management I had be willing to do in the future. After looking over the results of the UDS obtained at her visit earlier this month patient tested positive for THC. I brought this up to patient, and she did not deny its use. I also discussed her history of alcohol abuse, and the fact that these 2 issues were significant red flags for me managing her pain down the road. Patient stated her understanding.  ROS: Denies fevers or chills. Endorses some nausea, vomiting, and persistent abdominal pain. No headaches, shortness of breath, dysphagia, chest pain, numbness, weakness, or paresthesias.  CC, SH/smoking status, and VS noted  Objective: BP 120/68   Pulse 69   Temp 98.2 F (36.8 C) (Oral)   Ht '5\' 6"'$  (1.676 m)   Wt 143 lb 6.4 oz (65 kg)   LMP 12/08/2006   SpO2 98%   BMI 23.15 kg/m  Gen: NAD, alert, cooperative, and pleasant. HEENT: NCAT, EOMI, PERRL, MMM CV: RRR, no murmur Resp: CTAB, no wheezes, non-labored Abd: S, epigastric and upper quadrant tenderness with palpation, periumbilical tenderness to palpation, large ventral hernia immediately left of the umbilicus noted (stable  from previous exam). BS present, no guarding or organomegaly. Previous surgical scars present. Ext: No edema, warm Neuro: Alert and oriented, Speech clear, No gross deficits  Assessment and plan:  Abdominal pain, right upper quadrant Stable: Patient is here for follow-up on her abdominal pain. No changes in her pain since last visit. Patient and I had a long discussion discussing the future of her pain management. Due to a recent positive THC noted on UDS I will not be able to provide regular pain management. Of note, I was already hesitant due to her history of alcohol abuse, but was willing to give patient the benefit of the doubt. That said, I will provide patient 1 final prescription for Norco and will be referring patient to the pain clinic. I have informed her of this plan and patient stated her understanding. [Patient was calm, professional, and understanding throughout our entire visit] - Referral to pain clinic - Final prescription for Norco #40 provided.  Ventral hernia Stable: Patient has appointment with general surgery on 05/25/16. - Follow-up general surgery recommendations.  Major depressive disorder, recurrent, severe without psychotic features Patient endorsed some feelings of depression and unhappiness. Also had some complaints of insomnia. We discussed this at length. No SI/HI. - Initiate amitriptyline at night. (This medication has evidence behind its use in patients with insomnia, chronic pain, and depression) - We'll titrate dosage up as necessary. - Follow-up in 4-6 weeks; informed patient that no refills will be provided  until follow-up.   Orders Placed This Encounter  Procedures  . Ambulatory referral to Pain Clinic    Referral Priority:   Routine    Referral Type:   Consultation    Referral Reason:   Specialty Services Required    Requested Specialty:   Pain Medicine    Number of Visits Requested:   1    Meds ordered this encounter  Medications  .  HYDROcodone-acetaminophen (NORCO/VICODIN) 5-325 MG tablet    Sig: Take 1 tablet by mouth every 6 (six) hours as needed for moderate pain or severe pain.    Dispense:  40 tablet    Refill:  0  . amitriptyline (ELAVIL) 25 MG tablet    Sig: Take 1 tablet (25 mg total) by mouth at bedtime.    Dispense:  30 tablet    Refill:  1     Elberta Leatherwood, MD,MS,  PGY3 05/20/2016 5:36 PM

## 2016-05-20 NOTE — Assessment & Plan Note (Signed)
Stable: Patient is here for follow-up on her abdominal pain. No changes in her pain since last visit. Patient and I had a long discussion discussing the future of her pain management. Due to a recent positive THC noted on UDS I will not be able to provide regular pain management. Of note, I was already hesitant due to her history of alcohol abuse, but was willing to give patient the benefit of the doubt. That said, I will provide patient 1 final prescription for Norco and will be referring patient to the pain clinic. I have informed her of this plan and patient stated her understanding. [Patient was calm, professional, and understanding throughout our entire visit] - Referral to pain clinic - Final prescription for Norco #40 provided.

## 2016-05-25 ENCOUNTER — Ambulatory Visit: Payer: Self-pay | Admitting: Surgery

## 2016-05-25 NOTE — H&P (Signed)
[name] 05/25/2016 1:48 PM Location: [facility] Patient #: [id number] DOB: 07/28/1955 Single / Language: [name] / Race: White Female    History of Present Illness Adin Hector MD; 05/25/2016 2:50 PM) The patient is a 61 year old female who presents with an abdominal wall hernia. Note for "Abdominal hernia": Patient sent for surgical consultation for concern of ventral wall hernia.  61 year old female with incisional hernia that has been bothering her. Surgical consultation requested.   h/o alcohol use. She's had upper endoscopies done as well for Mallory-Weiss tears and bleeding varices. I had performed cholecystectomy on her in 2010. ERCP in 2013. Developed severe necrotizing pancreatitis and prior interventions including open operative debridement by my partner Dr. Hassell Done in 2013. Recently hernia got stuck & had to have it reduced. Most concerned. Evidence of small bowel within one of the hernias on CT scan in fall. She struggles with depression and anxiety and alcohol dependence. Working be abstinent. Start smoking again due to stress. Works as an Optometrist. 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 She was to try and get the hernia fixed since she's had a couple episodes of severe pain and 1 episode of near-incarceration. Surgical consultation requested. She also struggles with chronic pancreatitis with pancreatic duct stones. Evaluated at [facility]. Felt to benefit for possible lithotripsy. Most likely going to be sent down to [facility] for possible ERCP & guided lithotripsy and endoscopic intervention. No plans for any more abdominal surgeries.   Past Surgical History [name], [place]; 05/25/2016 1:48 PM) Pancreas Surgery   Diagnostic Studies History [name], [place]; 05/25/2016 1:48 PM) Colonoscopy never Mammogram 1-3 years ago Pap Smear 1-5 years ago  Allergies [name], RMA; 05/25/2016 1:49 PM) Ciprofloxacin *CHEMICALS* Nausea.  Medication History [name], [place]; 05/25/2016 1:51 PM) Hydrocodone-Acetaminophen (5-'325MG'$  Tablet, Oral) Active. Methocarbamol ('500MG'$  Tablet, Oral) Active. Amitriptyline HCl ('25MG'$  Tablet, Oral) Active. Multiple Vitamins-Minerals (Oral) Active. Zofran ('4MG'$  Tablet, Oral) Active. Medications Reconciled  Social History [name], [place]; 05/25/2016 1:48 PM) Alcohol use Remotely quit alcohol use. Caffeine use Coffee. No drug use Tobacco use Current every day smoker.  Family History [name], [place]; 05/25/2016 1:48 PM) Family history unknown First Degree Relatives Heart Disease Father. Heart disease in female family member before age 2 Hypertension Father.  Pregnancy / Birth History [name], [place]; 05/25/2016 1:48 PM) Age of menopause >59 Gravida 1 Irregular periods Maternal age 73-25 Para 1  Other Problems [name], [place]; 05/25/2016 1:48 PM) Pancreatitis Ventral Hernia Repair     Review of Systems [name] RMA; 05/25/2016 1:48 PM) General Present- Appetite Loss and Weight Loss. Not Present- Chills, Fatigue, Fever, Night Sweats and Weight Gain. Skin Not Present- Change in Wart/Mole, Dryness, Hives, Jaundice, New Lesions, Non-Healing Wounds, Rash and Ulcer. HEENT Not Present- Earache, Hearing Loss, Hoarseness, Nose Bleed, Oral Ulcers, Ringing in the Ears, Seasonal Allergies, Sinus Pain, Sore Throat, Visual Disturbances, Wears glasses/contact lenses and Yellow Eyes. Respiratory Not Present- Bloody sputum, Chronic Cough, Difficulty Breathing, Snoring and Wheezing. Breast Not Present- Breast Mass, Breast Pain, Nipple Discharge and Skin Changes. Cardiovascular Not Present- Chest Pain, Difficulty Breathing Lying Down, Leg Cramps, Palpitations, Rapid Heart Rate, Shortness of Breath and Swelling of Extremities. Gastrointestinal Present- Abdominal Pain. Not Present-  Bloating, Bloody Stool, Change in Bowel Habits, Chronic diarrhea, Constipation, Difficulty Swallowing, Excessive gas, Gets full quickly at meals, Hemorrhoids, Indigestion, Nausea, Rectal Pain and Vomiting. Female Genitourinary Not Present-  Frequency, Nocturia, Painful Urination, Pelvic Pain and Urgency. Musculoskeletal Not Present- Back Pain, Joint Pain, Joint Stiffness, Muscle Pain, Muscle Weakness and Swelling of Extremities. Neurological Not Present- Decreased Memory, Fainting, Headaches, Numbness, Seizures, Tingling, Tremor, Trouble walking and Weakness. Endocrine Not Present- Cold Intolerance, Excessive Hunger, Hair Changes, Heat Intolerance, Hot flashes and New Diabetes. Hematology Not Present- Blood Thinners, Easy Bruising, Excessive bleeding, Gland problems, HIV and Persistent Infections.  Vitals [name] RMA; 05/25/2016 1:52 PM) 05/25/2016 1:51 PM Weight: 140 lb Height: 66in Body Surface Area: 1.72 m Body Mass Index: 22.6 kg/m  Temp.: 98.30F  Pulse: 78 (Regular)  BP: 110/60 (Sitting, Left Arm, Standard)      Physical Exam Adin Hector MD; 05/25/2016 2:45 PM)  General Mental Status-Alert. General Appearance-Not in acute distress, Not Sickly. Orientation-Oriented X3. Hydration-Well hydrated. Voice-Normal. Note: Tearful but consolable. Not toxic. Not sickly.  Integumentary Global Assessment Upon inspection and palpation of skin surfaces of the - Axillae: non-tender, no inflammation or ulceration, no drainage. and Distribution of scalp and body hair is normal. General Characteristics Temperature - normal warmth is noted.  Head and Neck Head-normocephalic, atraumatic with no lesions or palpable masses. Face Global Assessment - atraumatic, no absence of expression. Neck Global Assessment - no abnormal movements, no bruit auscultated on the right, no bruit auscultated on the left, no decreased range of motion,  non-tender. Trachea-midline. Thyroid Gland Characteristics - non-tender.  Eye Eyeball - Left-Extraocular movements intact, No Nystagmus. Eyeball - Right-Extraocular movements intact, No Nystagmus. Cornea - Left-No Hazy. Cornea - Right-No Hazy. Sclera/Conjunctiva - Left-No scleral icterus, No Discharge. Sclera/Conjunctiva - Right-No scleral icterus, No Discharge. Pupil - Left-Direct reaction to light normal. Pupil - Right-Direct reaction to light normal. Note: Wears glasses. Vision corrected  ENMT Ears Pinna - Left - no drainage observed, no generalized tenderness observed. Right - no drainage observed, no generalized tenderness observed. Nose and Sinuses External Inspection of the Nose - no destructive lesion observed. Inspection of the nares - Left - quiet respiration. Right - quiet respiration. Mouth and Throat Lips - Upper Lip - no fissures observed, no pallor noted. Lower Lip - no fissures observed, no pallor noted. Nasopharynx - no discharge present. Oral Cavity/Oropharynx - Tongue - no dryness observed. Oral Mucosa - no cyanosis observed. Hypopharynx - no evidence of airway distress observed.  Chest and Lung Exam Inspection Movements - Normal and Symmetrical. Accessory muscles - No use of accessory muscles in breathing. Palpation Palpation of the chest reveals - Non-tender. Auscultation Breath sounds - Normal and Clear.  Cardiovascular Auscultation Rhythm - Regular. Murmurs & Other Heart Sounds - Auscultation of the heart reveals - No Murmurs and No Systolic Clicks.  Abdomen Inspection Inspection of the abdomen reveals - No Visible peristalsis and No Abnormal pulsations. Umbilicus - No Bleeding, No Urine drainage. Palpation/Percussion Palpation and Percussion of the abdomen reveal - Soft, Non Tender, No Rebound tenderness, No Rigidity (guarding) and No Cutaneous hyperesthesia. Note: Midline incision with flat out scarring and hernias consistent with  prior open laparotomy. 4-1/2 cm Swiss cheese region of periumbilical hernias. 2-1/2 circular when reducible and soft.   Abdomen soft. Nontender, nondistended. No guarding. No diastasis. No other hernias  Female Genitourinary Sexual Maturity Tanner 5 - Adult hair pattern. Note: Nontender. No inguinal hernias. No inguinal lymphadenopathy. No major vaginal bleeding nor foul discharge  Peripheral Vascular Upper Extremity Inspection - Left - No Cyanotic nailbeds, Not Ischemic. Right - No Cyanotic nailbeds, Not Ischemic.  Neurologic Neurologic evaluation reveals -normal attention span and  ability to concentrate, able to name objects and repeat phrases. Appropriate fund of knowledge , normal sensation and normal coordination. Mental Status Affect - not angry, not paranoid. Cranial Nerves-Normal Bilaterally. Gait-Normal.  Neuropsychiatric Mental status exam performed with findings of-able to articulate well with normal speech/language, rate, volume and coherence, thought content normal with ability to perform basic computations and apply abstract reasoning and no evidence of hallucinations, delusions, obsessions or homicidal/suicidal ideation.  Musculoskeletal Global Assessment Spine, Ribs and Pelvis - no instability, subluxation or laxity. Right Upper Extremity - no instability, subluxation or laxity.  Lymphatic Head & Neck  General Head & Neck Lymphatics: Bilateral - Description - No Localized lymphadenopathy. Axillary  General Axillary Region: Bilateral - Description - No Localized lymphadenopathy. Femoral & Inguinal  Generalized Femoral & Inguinal Lymphatics: Left - Description - No Localized lymphadenopathy. Right - Description - No Localized lymphadenopathy.    Assessment & Plan Adin Hector MD; 05/25/2016 2:47 PM)  INCISIONAL HERNIA, WITHOUT OBSTRUCTION OR GANGRENE (K43.2) Impression: Periumbilical ventral incisional hernias. Soft reducible today but history of  near incarceration.  I think she would benefit from surgical repair. Reasonable laparoscopic approach with underlay mesh. Possible overnight stay. Possible primary repair over one of the areas. Graft I strongly encouraged her to switch quit smoking due to decreased pain and decreased recurrence.  Glad she is trying to be abstinent of alcohol. A challenge for her.  She is motivated to get back to work. 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 Perhaps longer depending how things go.  PREOP - [place] - ENCOUNTER FOR PREOPERATIVE EXAMINATION FOR GENERAL SURGICAL PROCEDURE (Z01.818)  Current Plans You are being scheduled for surgery- Our schedulers will call you.  You should hear from our office's scheduling department within 5 working days about the location, date, and time of surgery. We try to make accommodations for patient's preferences in scheduling surgery, but sometimes the OR schedule or the surgeon's schedule prevents [place] from making those accommodations.  If you have not heard from our office [phone]) in 5 working days, call the office and ask for your surgeon's nurse.  If you have other questions about your diagnosis, plan, or surgery, call the office and ask for your surgeon's nurse.  Written instructions provided The anatomy & physiology of the abdominal wall was discussed. The pathophysiology of hernias was discussed. Natural history risks without surgery including progeressive enlargement, pain, incarceration, & strangulation was discussed. Contributors to complications such as smoking, obesity, diabetes, prior surgery, etc were discussed.  I feel the risks of no intervention will lead to serious problems that outweigh the operative risks; therefore, I recommended surgery to reduce and repair the hernia. I explained laparoscopic techniques with possible need for an open approach. I noted the probable use of mesh to patch and/or buttress the hernia  repair  Risks such as bleeding, infection, abscess, need for further treatment, heart attack, death, and other risks were discussed. I noted a good likelihood this will help address the problem. Goals of post-operative recovery were discussed as well. Possibility that this will not correct all symptoms was explained. I stressed the importance of low-impact activity, aggressive pain control, avoiding constipation, & not pushing through pain to minimize risk of post-operative chronic pain or injury. Possibility of reherniation especially with smoking, obesity, diabetes, immunosuppression, and other health conditions was discussed. We will work to minimize complications.  An educational handout further explaining the pathology & treatment options was given as well. Questions  were answered. The patient expresses understanding & wishes to proceed with surgery.  Pt Education - [facility] Hernia Post-Op HCI (Lafaye Mcelmurry): discussed with patient and provided information. Pt Education - [facility] Pain Control (Kaire Stary) Pt Education - Pamphlet Given - Laparoscopic Hernia Repair: discussed with patient and provided information. TOBACCO ABUSE (Z72.0)  Current Plans Pt Education - [facility] STOP SMOKING!  Adin Hector, M.D., F.A.C.S. Gastrointestinal and Minimally Invasive Surgery [facility] [address] [place],  [address] [phone] Main / Paging

## 2016-06-19 ENCOUNTER — Encounter: Payer: Self-pay | Admitting: Family Medicine

## 2016-06-19 ENCOUNTER — Ambulatory Visit (INDEPENDENT_AMBULATORY_CARE_PROVIDER_SITE_OTHER): Payer: Self-pay | Admitting: Family Medicine

## 2016-06-19 DIAGNOSIS — F332 Major depressive disorder, recurrent severe without psychotic features: Secondary | ICD-10-CM

## 2016-06-19 MED ORDER — AMITRIPTYLINE HCL 25 MG PO TABS: ORAL_TABLET | ORAL | 1 refills | Status: DC

## 2016-06-19 NOTE — Progress Notes (Signed)
fu

## 2016-06-19 NOTE — Assessment & Plan Note (Signed)
Stable/improved: Patient states that she has not noticed much difference since initiating amitriptyline. However, patient states that she has had an improvement in her symptoms but she does not believe this is due to this medication. Patient is agreeable to increase dose of this time. - Increasing amitriptyline: 1 tablet twice a day 1 week, then 1 tablet in the morning and 2 tablets at night ('75mg'$ , total, daily). - Follow-up in 6-8 weeks.

## 2016-06-19 NOTE — Progress Notes (Signed)
   HPI  CC: Depression Patient is here to follow-up on her depression. At the last visit she was started on amitriptyline due to signs and symptoms consistent with depression and issues with insomnia. Patient states that she has not noticed much of a difference since beginning this medication. She states that her depression is significantly improved but she does not believe that this is secondary to the medication. She denies any adverse side effects from this medication. No SI/HI. Otherwise she has no issues to discuss at this time.  Of note: Patient is notably excited about her upcoming surgical repair of her ventral hernia.  Patient endorses her chronic pain but states that there is no worsening since the last time I saw her. She denies any fever, diaphoresis, headache, vision changes, dizziness, lightheadedness, shortness of breath, chest pain, nausea, vomiting, diarrhea.  Review of Systems See HPI for ROS.   CC, SH/smoking status, and VS noted  Objective: BP 136/76   Pulse 80   Temp 98.5 F (36.9 C) (Oral)   Ht '5\' 6"'$  (1.676 m)   Wt 142 lb 6.4 oz (64.6 kg)   LMP 12/08/2006   SpO2 97%   BMI 22.98 kg/m  Gen: NAD, alert, cooperative, and pleasant. HEENT: NCAT, EOMI, PERRL CV: RRR, no murmur Resp: CTAB, no wheezes, non-labored Neuro: Alert and oriented, Speech clear, No gross deficits   Assessment and plan:  Major depression, recurrent Stable/improved: Patient states that she has not noticed much difference since initiating amitriptyline. However, patient states that she has had an improvement in her symptoms but she does not believe this is due to this medication. Patient is agreeable to increase dose of this time. - Increasing amitriptyline: 1 tablet twice a day 1 week, then 1 tablet in the morning and 2 tablets at night ('75mg'$ , total, daily). - Follow-up in 6-8 weeks.   Meds ordered this encounter  Medications  . gabapentin (NEURONTIN) 300 MG capsule    Sig: Take 300  mg by mouth 3 (three) times daily.  . valACYclovir (VALTREX) 1000 MG tablet    Sig: Take 1,000 mg by mouth daily as needed.  Marland Kitchen amitriptyline (ELAVIL) 25 MG tablet    Sig: 1 tablet in the morning. Take 2 tablets at night.    Dispense:  90 tablet    Refill:  1     Elberta Leatherwood, MD,MS,  PGY3 06/19/2016 6:13 PM

## 2016-06-19 NOTE — Patient Instructions (Addendum)
It was a pleasure seeing you today in our clinic. Today we discussed your medications. Here is the treatment plan we have discussed and agreed upon together:   - I would like to increase the dosing of your amitriptyline. Begin taking 1 tablet twice a day for the next week. Then, beginning 06/27/16, begin taking 1 tablet in the morning and 2 tablets before bedtime. - I would like to see you back in 6-8 weeks. - The referral to the pain management clinic was sent out on 05/21/16. According to our records your profile is still being reviewed and they will contact you for an appointment assuming you are accepted. If you have not heard back by 07/06/16. Please contact our office.

## 2016-07-07 NOTE — Patient Instructions (Addendum)
[name]  07/07/2016   Your procedure is scheduled on: Thursday 07/09/2016  Report to [facility] Main  Entrance take [place]  elevators to 3rd floor to  [place] at  1230  AM.  Call this number if you have problems the morning of surgery [phone]   Remember: ONLY 1 PERSON MAY GO WITH YOU TO SHORT STAY TO GET  READY MORNING OF [place].   Do not eat food  :After Midnight.  May drink clear liquids from midnight up until 0830 am  and then nothing after  0830 am!     [place] Allowed                                                                     Foods Excluded  Coffee and tea, regular and decaf                             liquids that you cannot  Plain Jell-O in any flavor                                             see through such as: Fruit ices (not with fruit pulp)                                     milk, soups, orange juice  Iced Popsicles                                    All solid food Carbonated beverages, regular and diet                                    Cranberry, grape and apple juices Sports drinks like Gatorade Lightly seasoned clear broth or consume(fat free) Sugar, honey syrup  Sample Menu Breakfast                                Lunch                                     Supper Cranberry juice                    Beef broth                            Chicken broth Jell-O                                     Grape juice  Apple juice Coffee or tea                        Jell-O                                      Popsicle                                                Coffee or tea                        Coffee or tea  _____________________________________________________________________     Take these medicines the morning of surgery with A SIP OF WATER:GABAPENTIN                               You may not have any metal on your body including hair pins and   piercings  Do not wear jewelry, make-up, lotions, powders or perfumes, deodorant             Do not wear nail polish.  Do not shave  48 hours prior to surgery.              Men may shave face and neck.   Do not bring valuables to the hospital. [place].  Contacts, dentures or bridgework may not be worn into surgery.  Leave suitcase in the car. After surgery it may be brought to your room.                  Please read over the following fact sheets you were given: _____________________________________________________________________             [facility] - Preparing for Surgery Before surgery, you can play an important role.  Because skin is not sterile, your skin needs to be as free of germs as possible.  You can reduce the number of germs on your skin by washing with CHG (chlorahexidine gluconate) soap before surgery.  CHG is an antiseptic cleaner which kills germs and bonds with the skin to continue killing germs even after washing. Please DO NOT use if you have an allergy to CHG or antibacterial soaps.  If your skin becomes reddened/irritated stop using the CHG and inform your nurse when you arrive at Short Stay. Do not shave (including legs and underarms) for at least 48 hours prior to the first CHG shower.  You may shave your face/neck. Please follow these instructions carefully:  1.  Shower with CHG Soap the night before surgery and the  morning of Surgery.  2.  If you choose to wash your hair, wash your hair first as usual with your  normal  shampoo.  3.  After you shampoo, rinse your hair and body thoroughly to remove the  shampoo.                           4.  Use CHG as you would any other liquid soap.  You can apply chg directly  to the skin and wash  Gently with a scrungie or clean washcloth.  5.  Apply the CHG Soap to your body ONLY FROM THE NECK DOWN.   Do not use on face/ open                            Wound or open sores. Avoid contact with eyes, ears mouth and genitals (private parts).                       Wash face,  Genitals (private parts) with your normal soap.             6.  Wash thoroughly, paying special attention to the area where your surgery  will be performed.  7.  Thoroughly rinse your body with warm water from the neck down.  8.  DO NOT shower/wash with your normal soap after using and rinsing off  the CHG Soap.                9.  Pat yourself dry with a clean towel.            10.  Wear clean pajamas.            11.  Place clean sheets on your bed the night of your first shower and do not  sleep with pets. Day of Surgery : Do not apply any lotions/deodorants the morning of surgery.  Please wear clean clothes to the hospital/surgery center.  FAILURE TO FOLLOW THESE INSTRUCTIONS MAY RESULT IN THE CANCELLATION OF YOUR SURGERY PATIENT SIGNATURE_________________________________  NURSE SIGNATURE__________________________________  ________________________________________________________________________

## 2016-07-08 ENCOUNTER — Encounter (HOSPITAL_COMMUNITY): Payer: Self-pay

## 2016-07-08 ENCOUNTER — Encounter (HOSPITAL_COMMUNITY)
Admission: RE | Admit: 2016-07-08 | Discharge: 2016-07-08 | Disposition: A | Discharge status: Home / Self Care | Payer: BLUE CROSS/BLUE SHIELD | Source: Ambulatory Visit | Attending: Surgery | Admitting: Surgery

## 2016-07-08 DIAGNOSIS — Z01812 Encounter for preprocedural laboratory examination: Secondary | ICD-10-CM | POA: Insufficient documentation

## 2016-07-08 DIAGNOSIS — K432 Incisional hernia without obstruction or gangrene: Secondary | ICD-10-CM | POA: Diagnosis not present

## 2016-07-08 HISTORY — DX: Constipation, unspecified: K59.00

## 2016-07-08 LAB — COMPREHENSIVE METABOLIC PANEL
ALBUMIN: 4.3 g/dL (ref 3.5–5.0)
ALT: 14 U/L (ref 14–54)
AST: 20 U/L (ref 15–41)
Alkaline Phosphatase: 69 U/L (ref 38–126)
Anion gap: 8 (ref 5–15)
BILIRUBIN TOTAL: 0.5 mg/dL (ref 0.3–1.2)
BUN: 10 mg/dL (ref 6–20)
CHLORIDE: 106 mmol/L (ref 101–111)
CO2: 27 mmol/L (ref 22–32)
CREATININE: 0.77 mg/dL (ref 0.44–1.00)
Calcium: 9.8 mg/dL (ref 8.9–10.3)
GFR calc Af Amer: >60 mL/min (ref >60)
GFR calc non Af Amer: >60 mL/min (ref >60)
Glucose, Bld: 139 mg/dL — ABNORMAL HIGH (ref 65–99)
POTASSIUM: 4 mmol/L (ref 3.5–5.1)
SODIUM: 141 mmol/L (ref 135–145)
Total Protein: 7.4 g/dL (ref 6.5–8.1)

## 2016-07-08 LAB — CBC
HCT: 43.2 % (ref 36.0–46.0)
Hemoglobin: 14.2 g/dL (ref 12.0–15.0)
MCH: 31 pg (ref 26.0–34.0)
MCHC: 32.9 g/dL (ref 30.0–36.0)
MCV: 94.3 fL (ref 78.0–100.0)
PLATELETS: 328 10*3/uL (ref 150–400)
RBC: 4.58 MIL/uL (ref 3.87–5.11)
RDW: 14.2 % (ref 11.5–15.5)
WBC: 10.8 10*3/uL — ABNORMAL HIGH (ref 4.0–10.5)

## 2016-07-09 ENCOUNTER — Observation Stay (HOSPITAL_COMMUNITY)
Admission: RE | Admit: 2016-07-09 | Discharge: 2016-07-10 | Disposition: A | Discharge status: Home / Self Care | Payer: BLUE CROSS/BLUE SHIELD | Source: Ambulatory Visit | Attending: Surgery | Admitting: Surgery

## 2016-07-09 ENCOUNTER — Encounter (HOSPITAL_COMMUNITY): Payer: Self-pay | Admitting: *Deleted

## 2016-07-09 ENCOUNTER — Ambulatory Visit (HOSPITAL_COMMUNITY): Payer: BLUE CROSS/BLUE SHIELD | Admitting: Anesthesiology

## 2016-07-09 ENCOUNTER — Encounter (HOSPITAL_COMMUNITY)
Admission: RE | Disposition: A | Discharge status: Home / Self Care | Payer: Self-pay | Source: Ambulatory Visit | Attending: Surgery

## 2016-07-09 DIAGNOSIS — K430 Incisional hernia with obstruction, without gangrene: Secondary | ICD-10-CM | POA: Diagnosis not present

## 2016-07-09 DIAGNOSIS — Z8249 Family history of ischemic heart disease and other diseases of the circulatory system: Secondary | ICD-10-CM | POA: Insufficient documentation

## 2016-07-09 DIAGNOSIS — F419 Anxiety disorder, unspecified: Secondary | ICD-10-CM | POA: Diagnosis not present

## 2016-07-09 DIAGNOSIS — K226 Gastro-esophageal laceration-hemorrhage syndrome: Secondary | ICD-10-CM | POA: Diagnosis not present

## 2016-07-09 DIAGNOSIS — K8681 Exocrine pancreatic insufficiency: Secondary | ICD-10-CM | POA: Insufficient documentation

## 2016-07-09 DIAGNOSIS — K8689 Other specified diseases of pancreas: Secondary | ICD-10-CM | POA: Insufficient documentation

## 2016-07-09 DIAGNOSIS — Z9889 Other specified postprocedural states: Secondary | ICD-10-CM | POA: Insufficient documentation

## 2016-07-09 DIAGNOSIS — Z79891 Long term (current) use of opiate analgesic: Secondary | ICD-10-CM | POA: Diagnosis not present

## 2016-07-09 DIAGNOSIS — F17210 Nicotine dependence, cigarettes, uncomplicated: Secondary | ICD-10-CM | POA: Diagnosis not present

## 2016-07-09 DIAGNOSIS — F339 Major depressive disorder, recurrent, unspecified: Secondary | ICD-10-CM | POA: Diagnosis present

## 2016-07-09 DIAGNOSIS — L987 Excessive and redundant skin and subcutaneous tissue: Secondary | ICD-10-CM | POA: Diagnosis not present

## 2016-07-09 DIAGNOSIS — K861 Other chronic pancreatitis: Secondary | ICD-10-CM | POA: Diagnosis not present

## 2016-07-09 DIAGNOSIS — K420 Umbilical hernia with obstruction, without gangrene: Secondary | ICD-10-CM | POA: Diagnosis not present

## 2016-07-09 DIAGNOSIS — Z881 Allergy status to other antibiotic agents status: Secondary | ICD-10-CM | POA: Insufficient documentation

## 2016-07-09 DIAGNOSIS — F1020 Alcohol dependence, uncomplicated: Secondary | ICD-10-CM | POA: Diagnosis present

## 2016-07-09 DIAGNOSIS — Z79899 Other long term (current) drug therapy: Secondary | ICD-10-CM | POA: Insufficient documentation

## 2016-07-09 DIAGNOSIS — K432 Incisional hernia without obstruction or gangrene: Secondary | ICD-10-CM | POA: Diagnosis present

## 2016-07-09 HISTORY — DX: Incisional hernia without obstruction or gangrene: K43.2

## 2016-07-09 HISTORY — PX: VENTRAL HERNIA REPAIR: SHX424

## 2016-07-09 HISTORY — DX: Alcohol dependence with withdrawal, unspecified: F10.239

## 2016-07-09 HISTORY — PX: INSERTION OF MESH: SHX5868

## 2016-07-09 SURGERY — REPAIR, HERNIA, VENTRAL, LAPAROSCOPIC
Anesthesia: General | Site: Abdomen

## 2016-07-09 MED ORDER — AMITRIPTYLINE HCL 50 MG PO TABS
50.0000 mg | ORAL_TABLET | Freq: Every day | ORAL | Status: DC
  Filled 2016-07-09 (×3): qty 1

## 2016-07-09 MED ORDER — NICOTINE 7 MG/24HR TD PT24
7.0000 mg | MEDICATED_PATCH | Freq: Every day | TRANSDERMAL | Status: DC
  Administered 2016-07-10: 7 mg via TRANSDERMAL
  Filled 2016-07-09: qty 1

## 2016-07-09 MED ORDER — DIPHENHYDRAMINE HCL 12.5 MG/5ML PO ELIX: 12.5000 mg | ORAL_SOLUTION | Freq: Four times a day (QID) | ORAL | Status: DC | PRN

## 2016-07-09 MED ORDER — ONDANSETRON HCL 4 MG/2ML IJ SOLN
INTRAMUSCULAR | Status: AC
  Filled 2016-07-09: qty 2

## 2016-07-09 MED ORDER — GABAPENTIN 300 MG PO CAPS
900.0000 mg | ORAL_CAPSULE | Freq: Three times a day (TID) | ORAL | Status: DC
  Administered 2016-07-09 – 2016-07-10 (×2): 900 mg via ORAL
  Filled 2016-07-09 (×2): qty 3

## 2016-07-09 MED ORDER — FENTANYL CITRATE (PF) 100 MCG/2ML IJ SOLN
INTRAMUSCULAR | Status: AC
  Filled 2016-07-09: qty 2

## 2016-07-09 MED ORDER — SUGAMMADEX SODIUM 200 MG/2ML IV SOLN
INTRAVENOUS | Status: AC
  Filled 2016-07-09: qty 2

## 2016-07-09 MED ORDER — ONDANSETRON HCL 4 MG/2ML IJ SOLN
4.0000 mg | Freq: Four times a day (QID) | INTRAMUSCULAR | Status: DC | PRN
  Filled 2016-07-09: qty 2

## 2016-07-09 MED ORDER — BUPIVACAINE LIPOSOME 1.3 % IJ SUSP
INTRAMUSCULAR | Status: DC | PRN
  Administered 2016-07-09 (×2): 20 mL

## 2016-07-09 MED ORDER — BUPIVACAINE HCL (PF) 0.25 % IJ SOLN
INTRAMUSCULAR | Status: AC
  Filled 2016-07-09: qty 30

## 2016-07-09 MED ORDER — ONDANSETRON HCL 4 MG/2ML IJ SOLN
INTRAMUSCULAR | Status: DC | PRN
  Administered 2016-07-09: 4 mg via INTRAVENOUS

## 2016-07-09 MED ORDER — LACTATED RINGERS IV SOLN
INTRAVENOUS | Status: DC
  Administered 2016-07-09: 16:00:00 via INTRAVENOUS
  Administered 2016-07-09: 1000 mL via INTRAVENOUS

## 2016-07-09 MED ORDER — ACETAMINOPHEN 500 MG PO TABS
1000.0000 mg | ORAL_TABLET | Freq: Three times a day (TID) | ORAL | Status: DC
  Administered 2016-07-09 – 2016-07-10 (×3): 1000 mg via ORAL
  Filled 2016-07-09 (×3): qty 2

## 2016-07-09 MED ORDER — MIDAZOLAM HCL 5 MG/5ML IJ SOLN
INTRAMUSCULAR | Status: DC | PRN
  Administered 2016-07-09: 2 mg via INTRAVENOUS

## 2016-07-09 MED ORDER — ALUM & MAG HYDROXIDE-SIMETH 200-200-20 MG/5ML PO SUSP: 30.0000 mL | Freq: Four times a day (QID) | ORAL | Status: DC | PRN

## 2016-07-09 MED ORDER — PROPOFOL 10 MG/ML IV BOLUS
INTRAVENOUS | Status: AC
  Filled 2016-07-09: qty 20

## 2016-07-09 MED ORDER — ROCURONIUM BROMIDE 50 MG/5ML IV SOSY
PREFILLED_SYRINGE | INTRAVENOUS | Status: AC
  Filled 2016-07-09: qty 5

## 2016-07-09 MED ORDER — METHOCARBAMOL 500 MG PO TABS
500.0000 mg | ORAL_TABLET | Freq: Four times a day (QID) | ORAL | Status: DC | PRN
Start: 2016-07-09 — End: 2016-07-10
  Administered 2016-07-10: 500 mg via ORAL
  Filled 2016-07-09 (×2): qty 1

## 2016-07-09 MED ORDER — ACETAMINOPHEN 500 MG PO TABS
1000.0000 mg | ORAL_TABLET | ORAL | Status: AC
  Administered 2016-07-09: 1000 mg via ORAL
  Filled 2016-07-09: qty 2

## 2016-07-09 MED ORDER — PROCHLORPERAZINE EDISYLATE 5 MG/ML IJ SOLN: 5.0000 mg | INTRAMUSCULAR | Status: DC | PRN

## 2016-07-09 MED ORDER — OXYCODONE HCL 5 MG/5ML PO SOLN
5.0000 mg | Freq: Once | ORAL | Status: DC | PRN
  Filled 2016-07-09: qty 5

## 2016-07-09 MED ORDER — SODIUM CHLORIDE 0.9% FLUSH: 3.0000 mL | Freq: Two times a day (BID) | INTRAVENOUS | Status: DC

## 2016-07-09 MED ORDER — SODIUM CHLORIDE 0.9 % IV SOLN: 250.0000 mL | INTRAVENOUS | Status: DC | PRN

## 2016-07-09 MED ORDER — CEFAZOLIN SODIUM-DEXTROSE 2-4 GM/100ML-% IV SOLN
2.0000 g | Freq: Three times a day (TID) | INTRAVENOUS | Status: AC
  Administered 2016-07-09 – 2016-07-10 (×2): 2 g via INTRAVENOUS
  Filled 2016-07-09 (×2): qty 100

## 2016-07-09 MED ORDER — CEFAZOLIN SODIUM-DEXTROSE 2-4 GM/100ML-% IV SOLN
2.0000 g | INTRAVENOUS | Status: AC
  Administered 2016-07-09: 2 g via INTRAVENOUS
  Filled 2016-07-09: qty 100

## 2016-07-09 MED ORDER — SUGAMMADEX SODIUM 200 MG/2ML IV SOLN
INTRAVENOUS | Status: DC | PRN
  Administered 2016-07-09: 200 mg via INTRAVENOUS

## 2016-07-09 MED ORDER — 0.9 % SODIUM CHLORIDE (POUR BTL) OPTIME
TOPICAL | Status: DC | PRN
  Administered 2016-07-09: 1000 mL

## 2016-07-09 MED ORDER — MAGIC MOUTHWASH
15.0000 mL | Freq: Four times a day (QID) | ORAL | Status: DC | PRN
Start: 2016-07-09 — End: 2016-07-10
  Filled 2016-07-09: qty 15

## 2016-07-09 MED ORDER — BUPIVACAINE LIPOSOME 1.3 % IJ SUSP
20.0000 mL | INTRAMUSCULAR | Status: DC
  Filled 2016-07-09: qty 20

## 2016-07-09 MED ORDER — SODIUM CHLORIDE 0.9 % IJ SOLN
INTRAMUSCULAR | Status: AC
  Filled 2016-07-09: qty 50

## 2016-07-09 MED ORDER — DEXAMETHASONE SODIUM PHOSPHATE 10 MG/ML IJ SOLN
INTRAMUSCULAR | Status: AC
  Filled 2016-07-09: qty 1

## 2016-07-09 MED ORDER — VITAMIN C 500 MG PO TABS
500.0000 mg | ORAL_TABLET | Freq: Two times a day (BID) | ORAL | Status: DC
  Administered 2016-07-09 – 2016-07-10 (×2): 500 mg via ORAL
  Filled 2016-07-09 (×2): qty 1

## 2016-07-09 MED ORDER — GABAPENTIN 300 MG PO CAPS
300.0000 mg | ORAL_CAPSULE | ORAL | Status: AC
  Administered 2016-07-09: 300 mg via ORAL
  Filled 2016-07-09: qty 1

## 2016-07-09 MED ORDER — MENTHOL 3 MG MT LOZG: 1.0000 | LOZENGE | OROMUCOSAL | Status: DC | PRN

## 2016-07-09 MED ORDER — PHENOL 1.4 % MT LIQD: 2.0000 | OROMUCOSAL | Status: DC | PRN

## 2016-07-09 MED ORDER — SODIUM CHLORIDE 0.9 % IV SOLN
8.0000 mg | Freq: Four times a day (QID) | INTRAVENOUS | Status: DC | PRN
  Filled 2016-07-09: qty 4

## 2016-07-09 MED ORDER — SIMETHICONE 80 MG PO CHEW: 40.0000 mg | CHEWABLE_TABLET | Freq: Four times a day (QID) | ORAL | Status: DC | PRN

## 2016-07-09 MED ORDER — ENOXAPARIN SODIUM 40 MG/0.4ML ~~LOC~~ SOLN
40.0000 mg | SUBCUTANEOUS | Status: DC
  Administered 2016-07-10: 40 mg via SUBCUTANEOUS
  Filled 2016-07-09: qty 0.4

## 2016-07-09 MED ORDER — OXYCODONE HCL 5 MG PO TABS: 5.0000 mg | ORAL_TABLET | Freq: Once | ORAL | Status: DC | PRN

## 2016-07-09 MED ORDER — METHOCARBAMOL 500 MG PO TABS: 500.0000 mg | ORAL_TABLET | Freq: Four times a day (QID) | ORAL | 1 refills | Status: DC | PRN

## 2016-07-09 MED ORDER — ONDANSETRON HCL 4 MG/2ML IJ SOLN: 4.0000 mg | Freq: Four times a day (QID) | INTRAMUSCULAR | Status: DC | PRN

## 2016-07-09 MED ORDER — POLYETHYLENE GLYCOL 3350 17 G PO PACK: 17.0000 g | PACK | Freq: Every day | ORAL | Status: DC | PRN

## 2016-07-09 MED ORDER — MIDAZOLAM HCL 2 MG/2ML IJ SOLN
INTRAMUSCULAR | Status: AC
  Filled 2016-07-09: qty 2

## 2016-07-09 MED ORDER — ADULT MULTIVITAMIN W/MINERALS CH
1.0000 | ORAL_TABLET | Freq: Every day | ORAL | Status: DC
  Administered 2016-07-10: 1 via ORAL
  Filled 2016-07-09: qty 1

## 2016-07-09 MED ORDER — HYDROMORPHONE HCL 2 MG/ML IJ SOLN
INTRAMUSCULAR | Status: AC
  Filled 2016-07-09: qty 1

## 2016-07-09 MED ORDER — SCOPOLAMINE 1 MG/3DAYS TD PT72
MEDICATED_PATCH | TRANSDERMAL | Status: AC
  Administered 2016-07-09: 1.5 mg via TRANSDERMAL
  Filled 2016-07-09: qty 1

## 2016-07-09 MED ORDER — PSYLLIUM 95 % PO PACK
1.0000 | PACK | Freq: Every day | ORAL | Status: DC
  Administered 2016-07-10: 1 via ORAL
  Filled 2016-07-09: qty 1

## 2016-07-09 MED ORDER — SODIUM CHLORIDE 0.9% FLUSH: 3.0000 mL | INTRAVENOUS | Status: DC | PRN

## 2016-07-09 MED ORDER — DIPHENHYDRAMINE HCL 50 MG/ML IJ SOLN: 12.5000 mg | Freq: Four times a day (QID) | INTRAMUSCULAR | Status: DC | PRN

## 2016-07-09 MED ORDER — CELECOXIB 200 MG PO CAPS
400.0000 mg | ORAL_CAPSULE | ORAL | Status: AC
  Administered 2016-07-09: 400 mg via ORAL
  Filled 2016-07-09: qty 2

## 2016-07-09 MED ORDER — HYDROMORPHONE HCL 1 MG/ML IJ SOLN
INTRAMUSCULAR | Status: DC | PRN
  Administered 2016-07-09: 2 mg via INTRAVENOUS

## 2016-07-09 MED ORDER — VALACYCLOVIR HCL 500 MG PO TABS: 1000.0000 mg | ORAL_TABLET | Freq: Every day | ORAL | Status: DC | PRN

## 2016-07-09 MED ORDER — LIDOCAINE 2% (20 MG/ML) 5 ML SYRINGE
INTRAMUSCULAR | Status: DC | PRN
  Administered 2016-07-09: 1.5 mg/kg/h via INTRAVENOUS

## 2016-07-09 MED ORDER — HYDROMORPHONE HCL 1 MG/ML IJ SOLN
0.2500 mg | INTRAMUSCULAR | Status: DC | PRN
  Administered 2016-07-09 (×2): 0.25 mg via INTRAVENOUS
  Administered 2016-07-09: 0.5 mg via INTRAVENOUS
  Administered 2016-07-09 (×2): 0.25 mg via INTRAVENOUS

## 2016-07-09 MED ORDER — LACTATED RINGERS IV BOLUS (SEPSIS)
1000.0000 mL | Freq: Three times a day (TID) | INTRAVENOUS | Status: DC | PRN
  Administered 2016-07-10: 1000 mL via INTRAVENOUS
  Filled 2016-07-09: qty 1000

## 2016-07-09 MED ORDER — HYDROMORPHONE HCL 1 MG/ML IJ SOLN
INTRAMUSCULAR | Status: AC
Start: 2016-07-09 — End: 2016-07-10
  Filled 2016-07-09: qty 2

## 2016-07-09 MED ORDER — DEXAMETHASONE SODIUM PHOSPHATE 10 MG/ML IJ SOLN
INTRAMUSCULAR | Status: DC | PRN
  Administered 2016-07-09: 10 mg via INTRAVENOUS

## 2016-07-09 MED ORDER — ONDANSETRON 4 MG PO TBDP: 4.0000 mg | ORAL_TABLET | Freq: Three times a day (TID) | ORAL | Status: DC | PRN

## 2016-07-09 MED ORDER — VALACYCLOVIR HCL 500 MG PO TABS
1000.0000 mg | ORAL_TABLET | Freq: Every day | ORAL | Status: DC | PRN
  Filled 2016-07-09: qty 2

## 2016-07-09 MED ORDER — OXYCODONE HCL 5 MG PO TABS: 5.0000 mg | ORAL_TABLET | ORAL | 0 refills | Status: DC | PRN

## 2016-07-09 MED ORDER — ROCURONIUM BROMIDE 10 MG/ML (PF) SYRINGE
PREFILLED_SYRINGE | INTRAVENOUS | Status: DC | PRN
Start: 2016-07-09 — End: 2016-07-09
  Administered 2016-07-09: 10 mg via INTRAVENOUS
  Administered 2016-07-09: 20 mg via INTRAVENOUS
  Administered 2016-07-09: 50 mg via INTRAVENOUS
  Administered 2016-07-09: 10 mg via INTRAVENOUS

## 2016-07-09 MED ORDER — BISACODYL 10 MG RE SUPP: 10.0000 mg | Freq: Every day | RECTAL | Status: DC | PRN

## 2016-07-09 MED ORDER — PROPOFOL 10 MG/ML IV BOLUS
INTRAVENOUS | Status: DC | PRN
  Administered 2016-07-09: 50 mg via INTRAVENOUS
  Administered 2016-07-09: 120 mg via INTRAVENOUS

## 2016-07-09 MED ORDER — HYDROMORPHONE HCL 1 MG/ML IJ SOLN
0.5000 mg | INTRAMUSCULAR | Status: DC | PRN
  Administered 2016-07-09: 0.5 mg via INTRAVENOUS
  Administered 2016-07-10 (×2): 1 mg via INTRAVENOUS
  Filled 2016-07-09 (×2): qty 1
  Filled 2016-07-09: qty 0.5

## 2016-07-09 MED ORDER — LACTATED RINGERS IV SOLN: INTRAVENOUS | Status: DC

## 2016-07-09 MED ORDER — LIDOCAINE 2% (20 MG/ML) 5 ML SYRINGE
INTRAMUSCULAR | Status: AC
Start: 2016-07-09 — End: 2016-07-09
  Filled 2016-07-09: qty 5

## 2016-07-09 MED ORDER — LIDOCAINE 2% (20 MG/ML) 5 ML SYRINGE
INTRAMUSCULAR | Status: DC | PRN
Start: 2016-07-09 — End: 2016-07-09
  Administered 2016-07-09: 60 mg via INTRAVENOUS

## 2016-07-09 MED ORDER — HYDRALAZINE HCL 20 MG/ML IJ SOLN: 5.0000 mg | Freq: Four times a day (QID) | INTRAMUSCULAR | Status: DC | PRN

## 2016-07-09 MED ORDER — METOCLOPRAMIDE HCL 5 MG/ML IJ SOLN: 5.0000 mg | Freq: Four times a day (QID) | INTRAMUSCULAR | Status: DC | PRN

## 2016-07-09 MED ORDER — SODIUM CHLORIDE 0.9 % IJ SOLN
INTRAMUSCULAR | Status: DC | PRN
  Administered 2016-07-09: 50 mL via INTRAVENOUS

## 2016-07-09 MED ORDER — LIP MEDEX EX OINT
1.0000 "application " | TOPICAL_OINTMENT | Freq: Two times a day (BID) | CUTANEOUS | Status: DC
  Administered 2016-07-09 – 2016-07-10 (×2): 1 via TOPICAL
  Filled 2016-07-09: qty 7

## 2016-07-09 MED ORDER — AMITRIPTYLINE HCL 25 MG PO TABS
25.0000 mg | ORAL_TABLET | Freq: Every day | ORAL | Status: DC
  Administered 2016-07-10: 25 mg via ORAL
  Filled 2016-07-09: qty 1

## 2016-07-09 MED ORDER — OXYCODONE HCL 5 MG PO TABS
5.0000 mg | ORAL_TABLET | ORAL | Status: DC | PRN
  Administered 2016-07-10 (×2): 5 mg via ORAL
  Administered 2016-07-10 (×2): 10 mg via ORAL
  Filled 2016-07-09: qty 2
  Filled 2016-07-09 (×2): qty 1
  Filled 2016-07-09: qty 2

## 2016-07-09 MED ORDER — FENTANYL CITRATE (PF) 100 MCG/2ML IJ SOLN
INTRAMUSCULAR | Status: DC | PRN
  Administered 2016-07-09: 25 ug via INTRAVENOUS
  Administered 2016-07-09 (×2): 50 ug via INTRAVENOUS
  Administered 2016-07-09: 25 ug via INTRAVENOUS
  Administered 2016-07-09: 50 ug via INTRAVENOUS

## 2016-07-09 MED ORDER — STERILE WATER FOR IRRIGATION IR SOLN
Status: DC | PRN
  Administered 2016-07-09: 1000 mL

## 2016-07-09 MED ORDER — SCOPOLAMINE 1 MG/3DAYS TD PT72
1.0000 | MEDICATED_PATCH | Freq: Once | TRANSDERMAL | Status: DC
  Administered 2016-07-09: 1.5 mg via TRANSDERMAL

## 2016-07-09 SURGICAL SUPPLY — 44 items
APPLIER CLIP 5 13 M/L LIGAMAX5 (MISCELLANEOUS)
BINDER ABDOMINAL 12 ML 46-62 (SOFTGOODS) ×4 IMPLANT
CABLE HIGH FREQUENCY MONO STRZ (ELECTRODE) ×4 IMPLANT
CHLORAPREP W/TINT 26ML (MISCELLANEOUS) ×4 IMPLANT
CLIP APPLIE 5 13 M/L LIGAMAX5 (MISCELLANEOUS) IMPLANT
CLOSURE WOUND 1/2 X4 (GAUZE/BANDAGES/DRESSINGS) ×2
COVER SURGICAL LIGHT HANDLE (MISCELLANEOUS) ×4 IMPLANT
DECANTER SPIKE VIAL GLASS SM (MISCELLANEOUS) ×4 IMPLANT
DEVICE SECURE STRAP 25 ABSORB (INSTRUMENTS) ×4 IMPLANT
DEVICE TROCAR PUNCTURE CLOSURE (ENDOMECHANICALS) ×4 IMPLANT
DRAIN CHANNEL 15F RND FF 3/16 (WOUND CARE) ×4 IMPLANT
DRAPE WARM FLUID 44X44 (DRAPE) ×4 IMPLANT
DRSG OPSITE POSTOP 4X10 (GAUZE/BANDAGES/DRESSINGS) ×4 IMPLANT
DRSG TEGADERM 2-3/8X2-3/4 SM (GAUZE/BANDAGES/DRESSINGS) ×4 IMPLANT
DRSG TEGADERM 4X4.75 (GAUZE/BANDAGES/DRESSINGS) ×4 IMPLANT
ELECT PENCIL ROCKER SW 15FT (MISCELLANEOUS) ×4 IMPLANT
ELECT REM PT RETURN 15FT ADLT (MISCELLANEOUS) ×4 IMPLANT
EVACUATOR SILICONE 100CC (DRAIN) ×4 IMPLANT
GAUZE SPONGE 2X2 8PLY STRL LF (GAUZE/BANDAGES/DRESSINGS) ×2 IMPLANT
GLOVE ECLIPSE 8.0 STRL XLNG CF (GLOVE) ×4 IMPLANT
GLOVE INDICATOR 8.0 STRL GRN (GLOVE) ×4 IMPLANT
GOWN STRL REUS W/TWL XL LVL3 (GOWN DISPOSABLE) ×16 IMPLANT
IRRIG SUCT STRYKERFLOW 2 WTIP (MISCELLANEOUS) ×4
IRRIGATION SUCT STRKRFLW 2 WTP (MISCELLANEOUS) ×2 IMPLANT
KIT BASIN OR (CUSTOM PROCEDURE TRAY) ×4 IMPLANT
MARKER SKIN DUAL TIP RULER LAB (MISCELLANEOUS) ×4 IMPLANT
MESH VENTRALIGHT ST 8X10 (Mesh General) ×4 IMPLANT
NEEDLE SPNL 22GX3.5 QUINCKE BK (NEEDLE) IMPLANT
PAD POSITIONING PINK XL (MISCELLANEOUS) ×4 IMPLANT
SCISSORS LAP 5X35 DISP (ENDOMECHANICALS) ×4 IMPLANT
SHEARS HARMONIC ACE PLUS 36CM (ENDOMECHANICALS) IMPLANT
SLEEVE XCEL OPT CAN 5 100 (ENDOMECHANICALS) ×8 IMPLANT
SPONGE GAUZE 2X2 STER 10/PKG (GAUZE/BANDAGES/DRESSINGS) ×2
STRIP CLOSURE SKIN 1/2X4 (GAUZE/BANDAGES/DRESSINGS) ×6 IMPLANT
SUT MNCRL AB 4-0 PS2 18 (SUTURE) ×8 IMPLANT
SUT PDS AB 1 CT1 27 (SUTURE) ×36 IMPLANT
SUT PROLENE 1 CT 1 30 (SUTURE) ×44 IMPLANT
SUT PROLENE 2 0 SH DA (SUTURE) ×4 IMPLANT
SUT VIC AB 3-0 SH 18 (SUTURE) ×8 IMPLANT
TOWEL OR 17X26 10 PK STRL BLUE (TOWEL DISPOSABLE) ×4 IMPLANT
TRAY LAPAROSCOPIC (CUSTOM PROCEDURE TRAY) ×4 IMPLANT
TROCAR BLADELESS OPT 5 100 (ENDOMECHANICALS) ×4 IMPLANT
TROCAR XCEL NON-BLD 11X100MML (ENDOMECHANICALS) IMPLANT
TUBING INSUF HEATED (TUBING) ×4 IMPLANT

## 2016-07-09 NOTE — Transfer of Care (Signed)
Immediate Anesthesia Transfer of Care Note  Patient: [name]  Procedure(s) Performed: Procedure(s): LAPAROSCOPIC LYSIS OF ADHESIONS  VENTRAL WALL HERNIA REPAIR WITH MESH EXCISION OF HERNIA [place] (N/A) INSERTION OF MESH  Patient Location: PACU  Anesthesia Type:General  Level of Consciousness: awake, alert  and oriented  Airway & Oxygen Therapy: Patient Spontanous Breathing and Patient connected to face mask oxygen  Post-op Assessment: Report given to RN and Post -op Vital signs reviewed and stable  Post vital signs: Reviewed and stable  Last Vitals:  Vitals:   07/09/16 1230  BP: (!) 151/75  Pulse: 70  Resp: 16  Temp: 36.5 C    Last Pain:  Vitals:   07/09/16 1317  TempSrc:   PainSc: 3       Patients Stated Pain Goal: 3 (54/56/25 6389)  Complications: No apparent anesthesia complications

## 2016-07-09 NOTE — Op Note (Addendum)
07/09/2016  PATIENT:  [name]  61 y.o. female  Patient Care Team: Elberta Leatherwood, MD as PCP - General (Family Medicine) Michael Boston, MD as Consulting Physician (General Surgery)  PRE-OPERATIVE DIAGNOSIS:  Incisional abdominal wall hernias  POST-OPERATIVE DIAGNOSIS:  Incisional abdominal wall hernias  PROCEDURE:   LAPAROSCOPIC LYSIS OF ADHESIONS  X 90 min INCISIONAL VENTRAL WALL HERNIA REPAIR  EXCISION OF HERNIA SAC and redundant skin/scar = Scar revision INSERTION OF MESH  LAPAROSCOPIC REPAIR OF Incarcerated Incisional abdominal wall HERNIA WITH MESH  SURGEON:  Adin Hector, MD  ASSISTANT: Nurse   ANESTHESIA:     General  Local anesthesia field block: (0.25% bupivacaine & liposomal  Bupivacaine [Experel])  EBL:  Total I/O In: 1000 [I.V.:1000] Out: 25 [Blood:25]  Per anesthesia record  Delay start of Pharmacological VTE agent (>24hrs) due to surgical blood loss or risk of bleeding:  no  DRAINS: none   SPECIMEN:  No Specimen  DISPOSITION OF SPECIMEN:  N/A  COUNTS:  YES  PLAN OF CARE: Admit for overnight observation  PATIENT DISPOSITION:  PACU - hemodynamically stable.  INDICATION: Pleasant patient has developed a ventral wall abdominal hernia.   Recommendation was made for surgical repair:  The anatomy & physiology of the abdominal wall was discussed. The pathophysiology of hernias was discussed. Natural history risks without surgery including progeressive enlargement, pain, incarceration & strangulation was discussed. Contributors to complications such as smoking, obesity, diabetes, prior surgery, etc were discussed.  I feel the risks of no intervention will lead to serious problems that outweigh the operative risks; therefore, I recommended surgery to reduce and repair the hernia. I explained laparoscopic techniques with possible need for an open approach. I noted the probable use of mesh to patch and/or buttress the hernia repair  Risks such as  bleeding, infection, abscess, need for further treatment, heart attack, death, and other risks were discussed. I noted a good likelihood this will help address the problem. Goals of post-operative recovery were discussed as well. Possibility that this will not correct all symptoms was explained. I stressed the importance of low-impact activity, aggressive pain control, avoiding constipation, & not pushing through pain to minimize risk of post-operative chronic pain or injury. Possibility of reherniation especially with smoking, obesity, diabetes, immunosuppression, and other health conditions was discussed. We will work to minimize complications.  An educational handout further explaining the pathology & treatment options was given as well. Questions were answered. The patient expresses understanding & wishes to proceed with surgery.   OR FINDINGS: 11x9 Swiss cheese periumbilical incisional hernias incarcerated with omentum and some small bowel within it.  Excess thinned out skin scar and hernia sacs excised. A 16 cm midline skin incision closed  Type of repair: Laparoscopic underlay repair.  Also debridement and primary closure   Placement of mesh: Intraperitoneal underlay repair  Name of mesh: Bard Ventralight dual sided (polypropylene / Seprafilm)  Size of mesh: 25x20cm  Orientation: Vertical  Mesh overlap:  5-7cm   DESCRIPTION:   Informed consent was confirmed. The patient underwent general anaesthesia without difficulty. The patient was positioned appropriately. VTE prevention in place. The patient's abdomen was clipped, prepped, & draped in a sterile fashion. Surgical timeout confirmed our plan.  The patient was positioned in reverse Trendelenburg. Abdominal entry was gained using optical entry technique in the left upper abdomen. Entry was clean. I induced carbon dioxide insufflation. Camera inspection revealed no injury. Extra ports were carefully placed under direct laparoscopic  visualization.   I could  see adhesions on the prietal peritoneum under the abdominal wall.   I did laparoscopic lysis of adhesions to expose the entire anterior abdominal wall.  I primarily used focused sharp dissection.   This took 90 minutes. I made sure hemostasis was good.  I mapped out the region using a needle passer.   To ensure that I would have at least 5 cm radial coverage outside of the hernia defect, I chose a 25x20cm dual sided mesh.  I placed #1 Prolene stitches around its edge about every 5 cm = 14 total.  I rolled the mesh & placed into the peritoneal cavity through the hernia defect.  I unrolled the mesh and positioned it appropriately.  I secured the mesh to cover up the hernia defect using a laparoscopic suture passer to pass the tails of the Prolene through the abdominal wall & tagged them with clamps for good transfascial suturing.  I started out in four corners to make sure I had the mesh centered under the hernia defect appropriately, and then proceeded to work in quadrants.    We evacuated CO2 & desufflated the abdomen.  I tied the fascial stitches down. I closed the fascial defect that I placed the mesh through using #1 PDS interrupted transverse stitches primarily.  I reinsufflated the abdomen. The mesh provided good  circumferential coverage around the entire region of hernia defects.  However the skin was extremely thinned out and somewhat ischemic in many of the stretched out hernia sacs.  Numerous moderate-sized hernia sacs. I decided to excise the old scar and extremely thinned out skin. I freed the skin and subcutaneous tissues off the fascia circumferentially. The hernia drinks 21 defect. Just the fascia off the subjacent tissues out about 7 cm laterally. Came together well since she was rather stretched out and redundant. It out redundant skin and hernia sac. Preserve the umbilicus. As the hernia with #1 PDS in a running fashion from the epigastric to the suprapubic region.  This proximal fascia together well to the midline without loss of tension.   I secured the mesh centrally with an additional trans fascial stitch in & out the mesh using #1 prolene under laparoscopic visualization.   I did did at 4 locations in right and left paramedian superior/inferior regions. I tacked the edges & central part of the mesh to the peritoneum/posterior rectus fascia with SecureStrap absorbable tacks.   I did reinspection. Hemostasis was good. Mesh laid well. I completed a broad field block of local anesthesia at fascial stitch sites & fascial closure areas.    Capnoperitoneum was evacuated. Ports were removed. The skin was closed with Monocryl at the port sites and Steri-Strips on the fascial stitch puncture sites.  I placed a drain in the subcutaneous plane over the fascia to close the skin flaps. Redundant skin flaps and hernia sac until the skin and subcutaneous tissues came catheter much more snugly.   midline skin closed with 3-0 Vicryl deep dermal interrupted sutures and 4 Monocryl running subcutaneous trigger suture. Patient is being extubated to go to the recovery room.  the patient did not want me to call or locate friends or family to discuss patient's status and recommendations.  No one is available at this time.   I discussed postop care and detail with the patient in the office and again in the holding area. She got postoperative instructions in the office. She will get more on the floor.r  Adin Hector, M.D., F.A.C.S. Gastrointestinal and Minimally Invasive  [facility] [address] [place], [place] [id number] [phone] Main / Paging  07/09/2016 5:25 PM

## 2016-07-09 NOTE — H&P (Signed)
[name] 05/25/2016 1:48 PM Location: [facility] Patient #: [id number] DOB: Sep 08, 1955 Single / Language: [name] / Race: White Female  Patient Care Team: Elberta Leatherwood, MD as PCP - General (Family Medicine) Michael Boston, MD as Consulting Physician (General Surgery)  History of Present Illness  The patient is a 61 year old female who presents with an abdominal wall hernia. Note for "Abdominal hernia": Patient sent for surgical consultation for concern of ventral wall hernia.  61 year old female with incisional hernia that has been bothering her. Surgical consultation requested.   h/o alcohol use. She's had upper endoscopies done as well for Mallory-Weiss tears and bleeding varices. I had performed cholecystectomy on her in 2010. ERCP in 2013. Developed severe necrotizing pancreatitis and prior interventions including open operative debridement by my partner Dr. Hassell Done in 2013. Recently hernia got stuck & had to have it reduced. Most concerned. Evidence of small bowel within one of the hernias on CT scan in fall. She struggles with depression and anxiety and alcohol dependence. Working be abstinent. Start smoking again due to stress. Works as an Optometrist. 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 She was to try and get the hernia fixed since she's had a couple episodes of severe pain and 1 episode of near-incarceration. Surgical consultation requested. She also struggles with chronic pancreatitis with pancreatic duct stones. Evaluated at [facility]. Felt to benefit for possible lithotripsy. Most likely going to be sent down to [facility] for possible ERCP & guided lithotripsy and endoscopic intervention. No plans for any more abdominal surgeries.  No new events.  Ready for surgery  Past Surgical History Malachy Moan, [place]; 05/25/2016 1:48 PM) Pancreas Surgery   Diagnostic Studies History Malachy Moan, [place]; 05/25/2016 1:48 PM) Colonoscopy  never Mammogram  1-3 years ago Pap Smear  1-5 years ago  Allergies Malachy Moan, RMA; 05/25/2016 1:49 PM) Ciprofloxacin *CHEMICALS*  Nausea.  Medication History Malachy Moan, [place]; 05/25/2016 1:51 PM) Hydrocodone-Acetaminophen (5-'325MG'$  Tablet, Oral) Active. Methocarbamol ('500MG'$  Tablet, Oral) Active. Amitriptyline HCl ('25MG'$  Tablet, Oral) Active. Multiple Vitamins-Minerals (Oral) Active. Zofran ('4MG'$  Tablet, Oral) Active. Medications Reconciled  Social History Malachy Moan, [place]; 05/25/2016 1:48 PM) Alcohol use  Remotely quit alcohol use. Caffeine use  Coffee. No drug use  Tobacco use  Current every day smoker.  Family History Malachy Moan, [place]; 05/25/2016 1:48 PM) Family history unknown  First Degree Relatives  Heart Disease  Father. Heart disease in female family member before age 56  Hypertension  Father.  Pregnancy / Birth History Malachy Moan, [place]; 05/25/2016 1:48 PM) Age of menopause  >59 Gravida  1 Irregular periods  Maternal age  2-25 Para  1  Other Problems Malachy Moan, [place]; 05/25/2016 1:48 PM) Pancreatitis  Ventral Hernia Repair     Review of Systems Malachy Moan RMA; 05/25/2016 1:48 PM) General Present- Appetite Loss and Weight Loss. Not Present- Chills, Fatigue, Fever, Night Sweats and Weight Gain. Skin Not Present- Change in Wart/Mole, Dryness, Hives, Jaundice, New Lesions, Non-Healing Wounds, Rash and Ulcer. HEENT Not Present- Earache, Hearing Loss, Hoarseness, Nose Bleed, Oral Ulcers, Ringing in the Ears, Seasonal Allergies, Sinus Pain, Sore Throat, Visual Disturbances, Wears glasses/contact lenses and Yellow Eyes. Respiratory Not Present- Bloody sputum, Chronic Cough, Difficulty Breathing, Snoring and Wheezing. Breast Not Present- Breast Mass, Breast Pain, Nipple Discharge and Skin Changes. Cardiovascular Not Present- Chest Pain, Difficulty Breathing Lying Down, Leg  Cramps, Palpitations, Rapid Heart Rate, Shortness of Breath  and Swelling of Extremities. Gastrointestinal Present- Abdominal Pain. Not Present- Bloating, Bloody Stool, Change in Bowel Habits, Chronic diarrhea, Constipation, Difficulty Swallowing, Excessive gas, Gets full quickly at meals, Hemorrhoids, Indigestion, Nausea, Rectal Pain and Vomiting. Female Genitourinary Not Present- Frequency, Nocturia, Painful Urination, Pelvic Pain and Urgency. Musculoskeletal Not Present- Back Pain, Joint Pain, Joint Stiffness, Muscle Pain, Muscle Weakness and Swelling of Extremities. Neurological Not Present- Decreased Memory, Fainting, Headaches, Numbness, Seizures, Tingling, Tremor, Trouble walking and Weakness. Endocrine Not Present- Cold Intolerance, Excessive Hunger, Hair Changes, Heat Intolerance, Hot flashes and New Diabetes. Hematology Not Present- Blood Thinners, Easy Bruising, Excessive bleeding, Gland problems, HIV and Persistent Infections.  Vitals Malachy Moan RMA; 05/25/2016 1:52 PM) 05/25/2016 1:51 PM Weight: 140 lb Height: 66in Body Surface Area: 1.72 m Body Mass Index: 22.6 kg/m  Temp.: 98.22F  Pulse: 78 (Regular)  BP: 110/60 (Sitting, Left Arm, Standard)   LMP 12/08/2006      Physical Exam Adin Hector MD; 05/25/2016 2:45 PM) General Mental Status-Alert. General Appearance-Not in acute distress, Not Sickly. Orientation-Oriented X3. Hydration-Well hydrated. Voice-Normal. Note: Tearful but consolable. Not toxic. Not sickly.   Integumentary Global Assessment Upon inspection and palpation of skin surfaces of the - Axillae: non-tender, no inflammation or ulceration, no drainage. and Distribution of scalp and body hair is normal. General Characteristics Temperature - normal warmth is noted.  Head and Neck Head-normocephalic, atraumatic with no lesions or palpable masses. Face Global Assessment - atraumatic, no absence of expression. Neck Global  Assessment - no abnormal movements, no bruit auscultated on the right, no bruit auscultated on the left, no decreased range of motion, non-tender. Trachea-midline. Thyroid Gland Characteristics - non-tender.  Eye Eyeball - Left-Extraocular movements intact, No Nystagmus. Eyeball - Right-Extraocular movements intact, No Nystagmus. Cornea - Left-No Hazy. Cornea - Right-No Hazy. Sclera/Conjunctiva - Left-No scleral icterus, No Discharge. Sclera/Conjunctiva - Right-No scleral icterus, No Discharge. Pupil - Left-Direct reaction to light normal. Pupil - Right-Direct reaction to light normal. Note: Wears glasses. Vision corrected   ENMT Ears Pinna - Left - no drainage observed, no generalized tenderness observed. Right - no drainage observed, no generalized tenderness observed. Nose and Sinuses External Inspection of the Nose - no destructive lesion observed. Inspection of the nares - Left - quiet respiration. Right - quiet respiration. Mouth and Throat Lips - Upper Lip - no fissures observed, no pallor noted. Lower Lip - no fissures observed, no pallor noted. Nasopharynx - no discharge present. Oral Cavity/Oropharynx - Tongue - no dryness observed. Oral Mucosa - no cyanosis observed. Hypopharynx - no evidence of airway distress observed.  Chest and Lung Exam Inspection Movements - Normal and Symmetrical. Accessory muscles - No use of accessory muscles in breathing. Palpation Palpation of the chest reveals - Non-tender. Auscultation Breath sounds - Normal and Clear.  Cardiovascular Auscultation Rhythm - Regular. Murmurs & Other Heart Sounds - Auscultation of the heart reveals - No Murmurs and No Systolic Clicks.  Abdomen Inspection Inspection of the abdomen reveals - No Visible peristalsis and No Abnormal pulsations. Umbilicus - No Bleeding, No Urine drainage. Palpation/Percussion Palpation and Percussion of the abdomen reveal - Soft, Non Tender, No Rebound  tenderness, No Rigidity (guarding) and No Cutaneous hyperesthesia. Note: Midline incision with flat out scarring and hernias consistent with prior open laparotomy. 4-1/2 cm Swiss cheese region of periumbilical hernias. 2-1/2 circular when reducible and soft.   Abdomen soft. Nontender, nondistended. No guarding. No diastasis. No other hernias   Female Genitourinary Sexual Maturity Tanner 5 - Adult hair  pattern. Note: Nontender. No inguinal hernias. No inguinal lymphadenopathy. No major vaginal bleeding nor foul discharge   Peripheral Vascular Upper Extremity Inspection - Left - No Cyanotic nailbeds, Not Ischemic. Right - No Cyanotic nailbeds, Not Ischemic.  Neurologic Neurologic evaluation reveals -normal attention span and ability to concentrate, able to name objects and repeat phrases. Appropriate fund of knowledge , normal sensation and normal coordination. Mental Status Affect - not angry, not paranoid. Cranial Nerves-Normal Bilaterally. Gait-Normal.  Neuropsychiatric Mental status exam performed with findings of-able to articulate well with normal speech/language, rate, volume and coherence, thought content normal with ability to perform basic computations and apply abstract reasoning and no evidence of hallucinations, delusions, obsessions or homicidal/suicidal ideation.  Musculoskeletal Global Assessment Spine, Ribs and Pelvis - no instability, subluxation or laxity. Right Upper Extremity - no instability, subluxation or laxity.  Lymphatic Head & Neck  General Head & Neck Lymphatics: Bilateral - Description - No Localized lymphadenopathy. Axillary  General Axillary Region: Bilateral - Description - No Localized lymphadenopathy. Femoral & Inguinal  Generalized Femoral & Inguinal Lymphatics: Left - Description - No Localized lymphadenopathy. Right - Description - No Localized lymphadenopathy.    Assessment & Plan  INCISIONAL HERNIA, WITHOUT OBSTRUCTION OR  GANGRENE (K43.2) Impression: Periumbilical ventral incisional hernias. Soft reducible today but history of near incarceration.  I think she would benefit from surgical repair. Reasonable laparoscopic approach with underlay mesh. Possible overnight stay. Possible primary repair over one of the areas. Graft I strongly encouraged her to switch quit smoking due to decreased pain and decreased recurrence.  Glad she is trying to be abstinent of alcohol. A challenge for her.  She is motivated to get back to work. 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 Perhaps longer depending how things go.  No new events.  Ready for surgery   PREOP - [facility] - ENCOUNTER FOR PREOPERATIVE EXAMINATION FOR GENERAL SURGICAL PROCEDURE (Z01.818) Current Plans You are being scheduled for surgery- Our schedulers will call you.  You should hear from our office's scheduling department within 5 working days about the location, date, and time of surgery. We try to make accommodations for patient's preferences in scheduling surgery, but sometimes the OR schedule or the surgeon's schedule prevents [place] from making those accommodations.  If you have not heard from our office [phone]) in 5 working days, call the office and ask for your surgeon's nurse.  If you have other questions about your diagnosis, plan, or surgery, call the office and ask for your surgeon's nurse.  Written instructions provided The anatomy & physiology of the abdominal wall was discussed. The pathophysiology of hernias was discussed. Natural history risks without surgery including progeressive enlargement, pain, incarceration, & strangulation was discussed. Contributors to complications such as smoking, obesity, diabetes, prior surgery, etc were discussed.  I feel the risks of no intervention will lead to serious problems that outweigh the operative risks; therefore, I recommended surgery to reduce and repair the hernia. I  explained laparoscopic techniques with possible need for an open approach. I noted the probable use of mesh to patch and/or buttress the hernia repair  Risks such as bleeding, infection, abscess, need for further treatment, heart attack, death, and other risks were discussed. I noted a good likelihood this will help address the problem. Goals of post-operative recovery were discussed as well. Possibility that this will not correct all symptoms was explained. I stressed the importance of low-impact activity, aggressive pain control, avoiding constipation, & not pushing  through pain to minimize risk of post-operative chronic pain or injury. Possibility of reherniation especially with smoking, obesity, diabetes, immunosuppression, and other health conditions was discussed. We will work to minimize complications.  An educational handout further explaining the pathology & treatment options was given as well. Questions were answered. The patient expresses understanding & wishes to proceed with surgery.  Pt Education - [facility] Hernia Post-Op HCI (Latriece Anstine): discussed with patient and provided information. Pt Education - [facility] Pain Control (Harmonii Karle) Pt Education - Pamphlet Given - Laparoscopic Hernia Repair: discussed with patient and provided information. TOBACCO ABUSE (Z72.0) Current Plans Pt Education - [facility] STOP SMOKING!  STOP SMOKING! We talked to the patient about the dangers of smoking.  We stressed that tobacco use dramatically increases the risk of peri-operative complications such as infection, tissue necrosis leaving to problems with incision/wound and organ healing, hernia, chronic pain, heart attack, stroke, DVT, pulmonary embolism, and death.  We noted there are programs in our community to help stop smoking.  Information was available.   Adin Hector, M.D., F.A.C.S. Gastrointestinal and Minimally Invasive Surgery [facility] [address], [place], [place]  [id number] [phone] Main / Paging

## 2016-07-09 NOTE — Discharge Instructions (Signed)
[facility] REPAIR: POST OP INSTRUCTIONS  ######################################################################  EAT Gradually transition to a high fiber diet with a fiber supplement over the next few weeks after discharge.  Start with a pureed / full liquid diet (see below)  WALK Walk an hour a day.  Control your pain to do that.    CONTROL PAIN Control pain so that you can walk, sleep, tolerate sneezing/coughing, go up/down stairs.  HAVE A BOWEL MOVEMENT DAILY Keep your bowels regular to avoid problems.  OK to try a laxative to override constipation.  OK to use an antidairrheal to slow down diarrhea.  Call if not better after 2 tries  CALL IF YOU HAVE PROBLEMS/CONCERNS Call if you are still struggling despite following these instructions. Call if you have concerns not answered by these instructions  ######################################################################    1. DIET: Follow a light bland diet the first 24 hours after arrival home, such as soup, liquids, crackers, etc.  Be sure to include lots of fluids daily.  Avoid fast food or heavy meals as your are more likely to get nauseated.  Eat a low fat the next few days after surgery. 2. Take your usually prescribed home medications unless otherwise directed. 3. PAIN CONTROL: a. Pain is best controlled by a usual combination of three different methods TOGETHER: i. Ice/Heat ii. Over the counter pain medication iii. Prescription pain medication b. Most patients will experience some swelling and bruising around the hernia(s) such as the bellybutton, groins, or old incisions.  Ice packs or heating pads (30-60 minutes up to 6 times a day) will help. Use ice for the first few days to help decrease swelling and bruising, then switch to heat to help relax tight/sore spots and speed recovery.  Some people prefer to use ice alone, heat alone, alternating between ice & heat.  Experiment to what works for you.  Swelling and bruising can take  several weeks to resolve.   c. It is helpful to take an over-the-counter pain medication regularly for the first few weeks.  Choose one of the following that works best for you: i. Naproxen (Aleve, etc)  Two 216m tabs twice a day ii. Ibuprofen (Advil, etc) Three 2028mtabs four times a day (every meal & bedtime) iii. Acetaminophen (Tylenol, etc) 325-6505mour times a day (every meal & bedtime) d. A  prescription for pain medication should be given to you upon discharge.  Take your pain medication as prescribed.  i. If you are having problems/concerns with the prescription medicine (does not control pain, nausea, vomiting, rash, itching, etc), please call us [phone] see if we need to switch you to a different pain medicine that will work better for you and/or control your side effect better. ii. If you need a refill on your pain medication, please contact your pharmacy.  They will contact our office to request authorization. Prescriptions will not be filled after 5 pm or on week-ends. 4. Avoid getting constipated.  Between the surgery and the pain medications, it is common to experience some constipation.  Increasing fluid intake and taking a fiber supplement (such as Metamucil, Citrucel, FiberCon, MiraLax, etc) 1-2 times a day regularly will usually help prevent this problem from occurring.  A mild laxative (prune juice, Milk of Magnesia, MiraLax, etc) should be taken according to package directions if there are no bowel movements after 48 hours.   5. Wash / shower every day.  You may shower over the dressings as they are waterproof.   6. Remove  your waterproof bandages 5 days after surgery.  You may leave the incision open to air.  You may replace a dressing/Band-Aid to cover the incision for comfort if you wish.  Continue to shower over incision(s) after the dressing is off.    7. ACTIVITIES as tolerated:   a. You may resume regular (light) daily activities beginning the next day--such  as daily self-care, walking, climbing stairs--gradually increasing activities as tolerated.  If you can walk 30 minutes without difficulty, it is safe to try more intense activity such as jogging, treadmill, bicycling, low-impact aerobics, swimming, etc. b. Save the most intensive and strenuous activity for last such as sit-ups, heavy lifting, contact sports, etc  Refrain from any heavy lifting or straining until you are off narcotics for pain control.   c. DO NOT PUSH THROUGH PAIN.  Let pain be your guide: If it hurts to do something, don't do it.  Pain is your body warning you to avoid that activity for another week until the pain goes down. d. You may drive when you are no longer taking prescription pain medication, you can comfortably wear a seatbelt, and you can safely maneuver your car and apply brakes. e. Dennis Bast may have sexual intercourse when it is comfortable.  8. FOLLOW UP in our office a. Please call [facility] at [phone] to set up an appointment to see your surgeon in the office for a follow-up appointment approximately 2-3 weeks after your surgery. b. Make sure that you call for this appointment the day you arrive home to insure a convenient appointment time. 9.  IF YOU HAVE DISABILITY OR FAMILY LEAVE FORMS, BRING THEM TO THE OFFICE FOR PROCESSING.  DO NOT GIVE THEM TO YOUR DOCTOR.  WHEN TO CALL us [phone]: 1. Poor pain control 2. Reactions / problems with new medications (rash/itching, nausea, etc)  3. Fever over 101.5 F (38.5 C) 4. Inability to urinate 5. Nausea and/or vomiting 6. Worsening swelling or bruising 7. Continued bleeding from incision. 8. Increased pain, redness, or drainage from the incision   The clinic staff is available to answer your questions during regular business hours (8:30am-5pm).  Please dont hesitate to call and ask to speak to one of our nurses for clinical concerns.   If you have a medical emergency, go to the nearest emergency room or call  911.  A surgeon from [name] is always on call at the hospitals in [name], [place], [place], [place], [place]  [address] ?  [address] [address], [place], [place]   [address] MAIN: [phone] ? TOLL FREE: [phone] ? FAX: [phone] [email or web address]  STOP SMOKING!  We strongly recommend that you stop smoking.  Smoking increases the risk of surgery including infection in the form of an open wound, pus formation, abscess, hernia at an incision on the abdomen, etc.  You have an increased risk of other MAJOR complications such as stroke, heart attack, forming clots in the leg and/or lungs, and death.    Smoking Cessation Quitting smoking is important to your health and has many advantages. However, it is not always easy to quit since nicotine is a very addictive drug. Often times, people try 3 times or more before being able to quit. This document explains the best ways for you to prepare to quit smoking. Quitting takes hard work and a lot of effort, but you can do it. ADVANTAGES OF QUITTING SMOKING  You will live longer, feel  better, and live better.  Your body will feel the impact of quitting smoking almost immediately.  Within 20 minutes, blood pressure decreases. Your pulse returns to its normal level.  After 8 hours, carbon monoxide levels in the blood return to normal. Your oxygen level increases.  After 24 hours, the chance of having a heart attack starts to decrease. Your breath, hair, and body stop smelling like smoke.  After 48 hours, damaged nerve endings begin to recover. Your sense of taste and smell improve.  After 72 hours, the body is virtually free of nicotine. Your bronchial tubes relax and breathing becomes easier.  After 2 to 12 weeks, lungs can hold more air. Exercise becomes easier and circulation improves.  The risk of having a heart attack, stroke, cancer, or lung disease is greatly  reduced.  After 1 year, the risk of coronary heart disease is cut in half.  After 5 years, the risk of stroke falls to the same as a nonsmoker.  After 10 years, the risk of lung cancer is cut in half and the risk of other cancers decreases significantly.  After 15 years, the risk of coronary heart disease drops, usually to the level of a nonsmoker.  If you are pregnant, quitting smoking will improve your chances of having a healthy baby.  The people you live with, especially any children, will be healthier.  You will have extra money to spend on things other than cigarettes. QUESTIONS TO THINK ABOUT BEFORE ATTEMPTING TO QUIT You may want to talk about your answers with your caregiver.  Why do you want to quit?  If you tried to quit in the past, what helped and what did not?  What will be the most difficult situations for you after you quit? How will you plan to handle them?  Who can help you through the tough times? Your family? Friends? A caregiver?  What pleasures do you get from smoking? What ways can you still get pleasure if you quit? Here are some questions to ask your caregiver:  How can you help me to be successful at quitting?  What medicine do you think would be best for me and how should I take it?  What should I do if I need more help?  What is smoking withdrawal like? How can I get information on withdrawal? GET READY  Set a quit date.  Change your environment by getting rid of all cigarettes, ashtrays, matches, and lighters in your home, car, or work. Do not let people smoke in your home.  Review your past attempts to quit. Think about what worked and what did not. GET SUPPORT AND ENCOURAGEMENT You have a better chance of being successful if you have help. You can get support in many ways.  Tell your family, friends, and co-workers that you are going to quit and need their support. Ask them not to smoke around you.  Get individual, group, or telephone  counseling and support. Programs are available at [employer] and health centers. Call your local health department for information about programs in your area.  Spiritual beliefs and practices may help some smokers quit.  Download a "quit meter" on your computer to keep track of quit statistics, such as how long you have gone without smoking, cigarettes not smoked, and money saved.  Get a self-help book about quitting smoking and staying off of tobacco. [place] yourself from urges to smoke. Talk to someone, go for a walk,  or occupy your time with a task.  Change your normal routine. Take a different route to work. Drink tea instead of coffee. Eat breakfast in a different place.  Reduce your stress. Take a hot bath, exercise, or read a book.  Plan something enjoyable to do every day. Reward yourself for not smoking.  Explore interactive web-based programs that specialize in helping you quit. GET MEDICINE AND USE IT CORRECTLY Medicines can help you stop smoking and decrease the urge to smoke. Combining medicine with the above behavioral methods and support can greatly increase your chances of successfully quitting smoking.  Nicotine replacement therapy helps deliver nicotine to your body without the negative effects and risks of smoking. Nicotine replacement therapy includes nicotine gum, lozenges, inhalers, nasal sprays, and skin patches. Some may be available over-the-counter and others require a prescription.  Antidepressant medicine helps people abstain from smoking, but how this works is unknown. This medicine is available by prescription.  Nicotinic receptor partial agonist medicine simulates the effect of nicotine in your brain. This medicine is available by prescription. Ask your caregiver for advice about which medicines to use and how to use them based on your health history. Your caregiver will tell you what side effects to look out for if you  choose to be on a medicine or therapy. Carefully read the information on the package. Do not use any other product containing nicotine while using a nicotine replacement product.  RELAPSE OR DIFFICULT SITUATIONS Most relapses occur within the first 3 months after quitting. Do not be discouraged if you start smoking again. Remember, most people try several times before finally quitting. You may have symptoms of withdrawal because your body is used to nicotine. You may crave cigarettes, be irritable, feel very hungry, cough often, get headaches, or have difficulty concentrating. The withdrawal symptoms are only temporary. They are strongest when you first quit, but they will go away within 10 14 days. To reduce the chances of relapse, try to:  Avoid drinking alcohol. Drinking lowers your chances of successfully quitting.  Reduce the amount of caffeine you consume. Once you quit smoking, the amount of caffeine in your body increases and can give you symptoms, such as a rapid heartbeat, sweating, and anxiety.  Avoid smokers because they can make you want to smoke.  Do not let weight gain distract you. Many smokers will gain weight when they quit, usually less than 10 pounds. Eat a healthy diet and stay active. You can always lose the weight gained after you quit.  Find ways to improve your mood other than smoking. FOR MORE INFORMATION  www.smokefree.gov    While it can be one of the most difficult things to do, the [place] community has programs to help you stop.  Consider talking with your primary care physician about options.  Also, Smoking Cessation classes are available through the [name]:  The smoking cessation program is a proven-effective program from the American Lung Association. The program is available for anyone 90 and older who currently smokes. The program lasts for 7 weeks and is 8 sessions. Each class will be approximately 1 1/2 hours. The program is every Tuesday.  All classes are  12-1:30pm and same location.  Event Location Information:  Location: [place] 2nd Floor Conference Room 2-037; located next to [facility] cross streets: [place] Entrance into the [facility] is adjacent to the [employer] main entrance. The conference  room is located on the 2nd floor.  Parking Instructions: Visitor parking is adjacent to [employer] main entrance and the [place]    A smoking cessation program is also offered through the [facility]. Register online at ClickDebate.gl or call [phone] for more information.   Tobacco cessation counseling is available at [facility]. Call [phone] for a free appointment.   Tobacco cessation classes also are available through the [place] in [place]. For information, call [phone].   The Patient Education Network features videos on tobacco cessation. Please consult your listings in the center of this book to find instructions on how to access this resource.   If you want more information, ask your nurse.

## 2016-07-09 NOTE — Anesthesia Procedure Notes (Signed)
Procedure Name: Intubation Date/Time: 07/09/2016 2:24 PM Performed by: Glory Buff Pre-anesthesia Checklist: Patient identified, Emergency Drugs available, Suction available, Patient being monitored and Timeout performed Patient Re-evaluated:Patient Re-evaluated prior to inductionOxygen Delivery Method: Circle system utilized Preoxygenation: Pre-oxygenation with 100% oxygen Intubation Type: IV induction Ventilation: Mask ventilation without difficulty Laryngoscope Size: Miller and 3 Grade View: Grade I Tube type: Oral Tube size: 7.5 mm Number of attempts: 1 Airway Equipment and Method: Stylet Placement Confirmation: ETT inserted through vocal cords under direct vision,  positive ETCO2 and breath sounds checked- equal and bilateral Secured at: 22 cm Tube secured with: Tape

## 2016-07-09 NOTE — Anesthesia Preprocedure Evaluation (Addendum)
Anesthesia Evaluation  Patient identified by MRN, date of birth, ID band Patient awake    Reviewed: Allergy & Precautions, H&P , NPO status   History of Anesthesia Complications (+) PONV and history of anesthetic complications  Airway Mallampati: I  TM Distance: >3 FB Neck ROM: Full    Dental  (+) Teeth Intact   Pulmonary Current Smoker,    breath sounds clear to auscultation       Cardiovascular  Rhythm:Regular Rate:Normal     Neuro/Psych  Neuromuscular disease    GI/Hepatic PUD, GERD  ,  Endo/Other  negative endocrine ROS  Renal/GU negative Renal ROS     Musculoskeletal   Abdominal (+) - obese,   Peds  Hematology negative hematology ROS (+)   Anesthesia Other Findings   Reproductive/Obstetrics                            Anesthesia Physical Anesthesia Plan  ASA: II  Anesthesia Plan:    Post-op Pain Management:    Induction:   Airway Management Planned:   Additional Equipment:   Intra-op Plan:   Post-operative Plan:   Informed Consent:   Plan Discussed with:   Anesthesia Plan Comments:         Anesthesia Quick Evaluation

## 2016-07-09 NOTE — Interval H&P Note (Signed)
History and Physical Interval Note:  07/09/2016 2:00 PM  [name]  has presented today for surgery, with the diagnosis of New abdominal wall hernia  The various methods of treatment have been discussed with the patient and family. After consideration of risks, benefits and other options for treatment, the patient has consented to  Procedure(s): [place]ill (N/A) as a surgical intervention .  The patient's history has been reviewed, patient examined, no change in status, stable for surgery.  I have reviewed the patient's chart and labs.  Questions were answered to the patient's satisfaction.     [name]

## 2016-07-10 ENCOUNTER — Encounter (HOSPITAL_COMMUNITY): Payer: Self-pay | Admitting: Surgery

## 2016-07-10 DIAGNOSIS — K430 Incisional hernia with obstruction, without gangrene: Secondary | ICD-10-CM | POA: Diagnosis not present

## 2016-07-10 MED ORDER — ENSURE ENLIVE PO LIQD
237.0000 mL | Freq: Two times a day (BID) | ORAL | Status: DC
  Administered 2016-07-10: 237 mL via ORAL

## 2016-07-10 NOTE — Discharge Summary (Signed)
Physician Discharge Summary  Patient ID: [name] MRN: [id number] DOB/AGE: Dec 30, 1955  61 y.o.  Admit date: 07/09/2016 Discharge date:   Patient Care Team: Elberta Leatherwood, MD as PCP - General (Family Medicine) Michael Boston, MD as Consulting Physician (General Surgery)  Discharge Diagnoses:  Principal Problem:   Incisional hernia s/p lap/open repair with mesh 07/09/2016 Active Problems:   Alcohol dependence ([place])   Major depression, recurrent ([place])   POST-OPERATIVE DIAGNOSIS:  Incisional abdominal wall hernias  PROCEDURE:   LAPAROSCOPIC LYSIS OF ADHESIONS  X 90 min INCISIONAL VENTRAL WALL HERNIA REPAIR  EXCISION OF HERNIA SAC and redundant skin/scar = Scar revision INSERTION OF MESH  LAPAROSCOPIC REPAIR OF  Incisional abdominal wall hernia(s)  HERNIA WITH MESH  SURGEON:  Adin Hector, MD  Consults: None  Hospital Course:   The patient underwent the surgery above.  Postoperatively, the patient gradually mobilized and advanced to a solid diet.  Pain and other symptoms were treated aggressively.    By the time of discharge, the patient was walking well the hallways, eating food, having flatus.  Pain was well-controlled on an oral medications.  Based on meeting discharge criteria and continuing to recover, I felt it was safe for the patient to be discharged from the hospital to further recover with close followup. Postoperative recommendations were discussed in detail.  They are written as well.  Discharged Condition: good  Disposition:  Follow-up Information    Ethelyne Erich C., MD Follow up in 3 week(s).   Specialty:  General Surgery Why:  To follow up after your operation Contact information: [address] [place] [address] [phone]        [facility], [place]. Schedule an appointment as soon as possible for a visit in 5 day(s).   Specialty:  General Surgery Why:  Retrun to office next week to have your plastic drain remove by  an office nurse. Contact information: [address] [place] [phone]          01-Home or Self Care  Discharge Instructions    Call MD for:    Complete by:  As directed    FEVER >101.5 F (Temperatures <101.72F occasionally happen and are not significant)   Call MD for:  extreme fatigue    Complete by:  As directed    Call MD for:  persistant dizziness or light-headedness    Complete by:  As directed    Call MD for:  persistant nausea and vomiting    Complete by:  As directed    Call MD for:  redness, tenderness, or signs of infection (pain, swelling, redness, odor or green/yellow discharge around incision site)    Complete by:  As directed    Call MD for:  severe uncontrolled pain    Complete by:  As directed    Diet - low sodium heart healthy    Complete by:  As directed    Follow a light diet the first few days at home.  Start with a bland diet such as soups, liquids, starchy foods, low fat foods, etc.   If you feel full, bloated, or constipated, stay on a full liquid or pureed/blenderized diet for a few days until you feel better and no longer constipated. Gradually get back to a regular solid diet.  Avoid fast food or heavy meals the first week as you are more likely to get nauseated.   Discharge instructions    Complete by:  As directed    One the day of your discharge from the hospital (or the next business weekday), please call [facility] to set up or confirm an appointment to see your surgeon in the office for a follow-up appointment.  Usually it is 2-3 weeks after your surgery.  Other concerns If you are not getting better after two weeks or are noticing you are getting worse, contact our office [phone] for further advice.  We may need to adjust your medications, re-evaluate you in the office, send you to the emergency room, or see what other things we can do to help. The clinic staff is available to  answer your questions during regular business hours (8:30am-5pm).  Please don't hesitate to call and ask to speak to one of our nurses for clinical concerns.    A surgeon from [facility] is always on call at the hospitals 24 hours/day If you have a medical emergency, go to the nearest emergency room or call 911.   Driving Restrictions    Complete by:  As directed    You may drive when you are no longer taking prescription pain medication, you can comfortably wear a seatbelt, and you can safely maneuver your car and apply brakes.   Increase activity slowly    Complete by:  As directed    Lifting restrictions    Complete by:  As directed    You may resume regular (light) daily activities beginning the next day-such as daily self-care, walking, climbing stairs-gradually increasing activities as tolerated.   If you can walk 30 minutes without difficulty, it is safe to try more intense activity such as jogging, treadmill, bicycling, low-impact aerobics, swimming, etc. Save the most intensive and strenuous activity for last such as sit-ups, heavy lifting, contact sports, etc   Refrain from any heavy lifting or straining until you are off narcotics for pain control.   DO NOT PUSH THROUGH PAIN.   Let pain be your guide: If it hurts to do something, don't do it.   Pain is your body warning you to avoid that activity for another week until the pain goes down.   May shower / Bathe    Complete by:  As directed    Wash / shower every day.  You may shower over the dressings as they are waterproof.  Continue to shower over incision(s) after the dressing is off.   May walk up steps    Complete by:  As directed    Remove dressing in 72 hours    Complete by:  As directed    Remove your waterproof bandages 3-5 days after surgery.   You may leave the incision(s) open to air.   You may replace a dressing/Band-Aid to cover the incision for comfort if you wish.   Sexual Activity Restrictions     Complete by:  As directed    You may have sexual intercourse when it is comfortable. If it hurts to do something, stop.      Allergies as of 07/10/2016      Reactions   Ciprofloxacin Nausea Only      Medication List    TAKE these medications   amitriptyline 25 MG tablet Commonly known as:  ELAVIL 1 tablet in the morning. Take 2 tablets at night. What changed:  how much to take  how to take this  when to take this  additional instructions   CALCIUM 600+D PO Take 1 tablet by mouth daily.  gabapentin 300 MG capsule Commonly known as:  NEURONTIN Take 900 mg by mouth 3 (three) times daily.   methocarbamol 500 MG tablet Commonly known as:  ROBAXIN Take 1-2 tablets (500-1,000 mg total) by mouth every 6 (six) hours as needed for muscle spasms. What changed:  how much to take  when to take this  reasons to take this   multivitamin with minerals Tabs tablet Take 1 tablet by mouth daily.   nicotine 7 mg/24hr patch Commonly known as:  NICODERM CQ - dosed in mg/24 hr Place 7 mg onto the skin as needed.   ondansetron 4 MG disintegrating tablet Commonly known as:  ZOFRAN ODT Take 1 tablet (4 mg total) by mouth every 8 (eight) hours as needed for nausea or vomiting.   oxyCODONE 5 MG immediate release tablet Commonly known as:  Oxy IR/ROXICODONE Take 1-2 tablets (5-10 mg total) by mouth every 4 (four) hours as needed for moderate pain, severe pain or breakthrough pain.   valACYclovir 1000 MG tablet Commonly known as:  VALTREX Take 1,000 mg by mouth daily as needed (fever blisters).       Significant Diagnostic Studies:  Results for orders placed or performed during the hospital encounter of 07/08/16 (from the past 72 hour(s))  CBC     Status: Abnormal   Collection Time: 07/08/16  2:38 PM  Result Value Ref Range   WBC 10.8 (H) 4.0 - 10.5 K/uL   RBC 4.58 3.87 - 5.11 MIL/uL   Hemoglobin 14.2 12.0 - 15.0 g/dL   HCT 43.2 36.0 - 46.0 %   MCV 94.3 78.0 - 100.0 fL    MCH 31.0 26.0 - 34.0 pg   MCHC 32.9 30.0 - 36.0 g/dL   RDW 14.2 11.5 - 15.5 %   Platelets 328 150 - 400 K/uL  Comprehensive metabolic panel     Status: Abnormal   Collection Time: 07/08/16  2:38 PM  Result Value Ref Range   Sodium 141 135 - 145 mmol/L   Potassium 4.0 3.5 - 5.1 mmol/L   Chloride 106 101 - 111 mmol/L   CO2 27 22 - 32 mmol/L   Glucose, Bld 139 (H) 65 - 99 mg/dL   BUN 10 6 - 20 mg/dL   Creatinine, Ser 0.77 0.44 - 1.00 mg/dL   Calcium 9.8 8.9 - 10.3 mg/dL   Total Protein 7.4 6.5 - 8.1 g/dL   Albumin 4.3 3.5 - 5.0 g/dL   AST 20 15 - 41 U/L   ALT 14 14 - 54 U/L   Alkaline Phosphatase 69 38 - 126 U/L   Total Bilirubin 0.5 0.3 - 1.2 mg/dL   GFR calc non Af Amer >60 >60 mL/min   GFR calc Af Amer >60 >60 mL/min    Comment: (NOTE) The eGFR has been calculated using the CKD EPI equation. This calculation has not been validated in all clinical situations. eGFR's persistently <60 mL/min signify possible Chronic Kidney Disease.    Anion gap 8 5 - 15    No results found.  Discharge Exam: Blood pressure (!) 109/59, pulse 61, temperature 98 F (36.7 C), temperature source Oral, resp. rate 16, height '5\' 7"'  (1.702 m), weight 64 kg (141 lb), last menstrual period 12/08/2006, SpO2 96 %.  General: Pt awake/alert/oriented x4 in No acute distress Eyes: PERRL, normal EOM.  Sclera clear.  No icterus Neuro: CN II-XII intact w/o focal sensory/motor deficits. Lymph: No head/neck/groin lymphadenopathy Psych:  No delerium/psychosis/paranoia HENT: Normocephalic, Mucus membranes moist.  No thrush  Neck: Supple, No tracheal deviation Chest: No chest wall pain w good excursion CV:  Pulses intact.  Regular rhythm MS: Normal AROM mjr joints.  No obvious deformity Abdomen: Soft.  Nondistended.  Mildly tender at incisions only.  No evidence of peritonitis.  No incarcerated hernias. Ext:  SCDs BLE.  No mjr edema.  No cyanosis Skin: No petechiae / purpura  Past Medical History:   Diagnosis Date  . Alcohol abuse    H/o withdrawal   . Alcohol withdrawal ([place]) 10/02/2013  . Anxiety   . Chronic diarrhea    hx of  . Constipation   . Depression   . Elevated liver function tests few yrs ago  . Pancreatitis   . PONV (postoperative nausea and vomiting)   . Rosacea   . Wears glasses     Past Surgical History:  Procedure Laterality Date  . CHOLECYSTECTOMY    . COLONOSCOPY  Never  . DIAGNOSTIC MAMMOGRAM  2008  . ERCP  10/06/2011   Procedure: ENDOSCOPIC RETROGRADE CHOLANGIOPANCREATOGRAPHY (ERCP);  Surgeon: Beryle Beams, MD;  Location: [facility];  Service: Endoscopy;  Laterality: N/A;  . ESOPHAGOGASTRODUODENOSCOPY  03/21/2011   Procedure: ESOPHAGOGASTRODUODENOSCOPY (EGD);  Surgeon: Scarlette Shorts, MD;  Location[name];  Service: Endoscopy;  Laterality: N/A;  Patient may need to be done at bedside tomorrow depending on status  . ESOPHAGOGASTRODUODENOSCOPY  01/06/2012   Procedure: ESOPHAGOGASTRODUODENOSCOPY (EGD);  Surgeon: Wonda Horner, MD;  Location: [facility] ENDOSCOPY;  Service: Endoscopy;  Laterality: N/A;  bedside  . LAPAROTOMY  01/03/2012   Procedure: EXPLORATORY LAPAROTOMY;  Surgeon: Pedro Earls, MD;  Location: [facility] ORS;  Service: General;  Laterality: N/A;  debridement of necrotic pancreas and placement of drains x2  . pancreatic cyst removed   2015  . SHOULDER SURGERY Left 02/18/2010   clavical repair  . TUBAL LIGATION      Social History   Social History  . Marital status: Divorced    Spouse name: N/A  . Number of children: N/A  . Years of education: N/A   Occupational History  . Not on file.   Social History Main Topics  . Smoking status: Light Tobacco Smoker    Packs/day: 0.25    Years: 15.00    Types: Cigarettes  . Smokeless tobacco: Never Used     Comment: trying nicotine patch  . Alcohol use No     Comment: quit 2016  . Drug use: No  . Sexual activity: No     Comment: Works in data entry and accounting at [employer]   Other Topics  Concern  . Not on file   Social History Narrative   Has a son and some friends with whom she is close. Was in rehab in 09/2010 and was sober for a few months after.     Family History  Problem Relation Age of Onset  . Stroke Mother   . Heart disease Father   . Heart failure Father   . Heart disease Sister     Current Facility-Administered Medications  Medication Dose Route Frequency Provider Last Rate Last Dose  . 0.9 %  sodium chloride infusion  250 mL Intravenous PRN Michael Boston, MD      . acetaminophen (TYLENOL) tablet 1,000 mg  1,000 mg Oral Q8H Michael Boston, MD   1,000 mg at 07/10/16 0514  . alum & mag hydroxide-simeth (MAALOX/MYLANTA) 200-200-20 MG/5ML suspension 30 mL  30 mL Oral Q6H PRN Michael Boston, MD      .  amitriptyline (ELAVIL) tablet 25 mg  25 mg Oral Daily Michael Boston, MD      . amitriptyline (ELAVIL) tablet 50 mg  50 mg Oral QHS Michael Boston, MD      . bisacodyl (DULCOLAX) suppository 10 mg  10 mg Rectal Daily PRN Michael Boston, MD      . diphenhydrAMINE (BENADRYL) 12.5 MG/5ML elixir 12.5 mg  12.5 mg Oral Q6H PRN Michael Boston, MD       Or  . diphenhydrAMINE (BENADRYL) injection 12.5 mg  12.5 mg Intravenous Q6H PRN Michael Boston, MD      . enoxaparin (LOVENOX) injection 40 mg  40 mg Subcutaneous Q24H Michael Boston, MD      . gabapentin (NEURONTIN) capsule 900 mg  900 mg Oral TID Michael Boston, MD   900 mg at 07/09/16 2101  . hydrALAZINE (APRESOLINE) injection 5-20 mg  5-20 mg Intravenous Q6H PRN Michael Boston, MD      . HYDROmorphone (DILAUDID) injection 0.5-2 mg  0.5-2 mg Intravenous Q2H PRN Michael Boston, MD   1 mg at 07/10/16 0037  . lactated ringers bolus 1,000 mL  1,000 mL Intravenous Q8H PRN Michael Boston, MD   1,000 mL at 07/10/16 0707  . lactated ringers infusion   Intravenous Continuous Albertha Ghee, MD      . lip balm (CARMEX) ointment 1 application  1 application Topical BID Michael Boston, MD   1 application at 65/46/50 2259  . magic mouthwash  15 mL Oral QID PRN  Michael Boston, MD      . menthol-cetylpyridinium (CEPACOL) lozenge 3 mg  1 lozenge Oral PRN Michael Boston, MD      . methocarbamol (ROBAXIN) tablet 500 mg  500 mg Oral Q6H PRN Michael Boston, MD   500 mg at 07/10/16 0703  . metoCLOPramide (REGLAN) injection 5-10 mg  5-10 mg Intravenous Q6H PRN Michael Boston, MD      . multivitamin with minerals tablet 1 tablet  1 tablet Oral Daily Michael Boston, MD      . nicotine (NICODERM CQ - dosed in mg/24 hr) patch 7 mg  7 mg Transdermal Daily Michael Boston, MD      . ondansetron [facility]) injection 4 mg  4 mg Intravenous Q6H PRN Michael Boston, MD       Or  . ondansetron (ZOFRAN) 8 mg in sodium chloride 0.9 % 50 mL IVPB  8 mg Intravenous Q6H PRN Michael Boston, MD      . ondansetron (ZOFRAN-ODT) disintegrating tablet 4 mg  4 mg Oral Q8H PRN Michael Boston, MD      . oxyCODONE (Oxy IR/ROXICODONE) immediate release tablet 5-10 mg  5-10 mg Oral Q4H PRN Michael Boston, MD   5 mg at 07/10/16 0516  . phenol (CHLORASEPTIC) mouth spray 2 spray  2 spray Mouth/Throat PRN Michael Boston, MD      . polyethylene glycol (MIRALAX / GLYCOLAX) packet 17 g  17 g Oral Daily PRN Michael Boston, MD      . prochlorperazine (COMPAZINE) injection 5-10 mg  5-10 mg Intravenous Q4H PRN Michael Boston, MD      . psyllium (HYDROCIL/METAMUCIL) packet 1 packet  1 packet Oral Daily Michael Boston, MD      . simethicone [facility]) chewable tablet 40 mg  40 mg Oral Q6H PRN Michael Boston, MD      . sodium chloride flush (NS) 0.9 % injection 3 mL  3 mL Intravenous Q12H Michael Boston, MD      . sodium chloride flush (  NS) 0.9 % injection 3 mL  3 mL Intravenous PRN Michael Boston, MD      . valACYclovir (VALTREX) tablet 1,000 mg  1,000 mg Oral Daily PRN Michael Boston, MD      . vitamin C (ASCORBIC ACID) tablet 500 mg  500 mg Oral BID Michael Boston, MD   500 mg at 07/09/16 2101     Allergies  Allergen Reactions  . Ciprofloxacin Nausea Only    Signed: Morton Peters, M.D., F.A.C.S. Gastrointestinal  and Minimally Invasive Surgery [facility] [address], [place], [place] [address] [phone] Main / Paging   07/10/2016, 7:44 AM

## 2016-07-10 NOTE — Progress Notes (Signed)
Initial Nutrition Assessment  DOCUMENTATION CODES:   Not applicable  INTERVENTION:   Ensure Enlive po BID, each supplement provides 350 kcal and 20 grams of protein  MVI  NUTRITION DIAGNOSIS:   Unintentional weight loss related to chronic illness, other (see comment) (etoh abuse) as evidenced by 17 percent weight loss in 4 months.  GOAL:   Patient will meet greater than or equal to 90% of their needs  MONITOR:   PO intake, Supplement acceptance, Labs, Weight trends  REASON FOR ASSESSMENT:   Malnutrition Screening Tool    ASSESSMENT:   61 y/o female with Incisional hernia s/p lap/open repair with mesh 07/09/2016   Met with pt in room today. Pt reports poor appetite for the past several months r/t pancreatic calcifications which she reports make her feel sick and not hungry. Pt reports that her appetite is improving and that she ate most of her breakfast today. Per chart, pt has lost 29lbs(17%) in 4 months; this is significant. Pt likes Ensure and drinks it at home sometimes; RD will order. Pt possibly to discharge today.   Medications reviewed and include: lovenox, MVI, nicotine, psyllium, Vit C, hydromorphone, oxycodone  Labs reviewed: wbc- 10.8(H)  Nutrition-Focused physical exam completed. Findings are no fat depletion, no muscle depletion, and no edema.   Diet Order:  Diet - low sodium heart healthy DIET SOFT Room service appropriate? Yes; Fluid consistency: Thin  Skin:  Wound (see comment) (abdominal )  Last BM:  3/22  Height:   Ht Readings from Last 1 Encounters:  07/09/16 '5\' 7"'$  (1.702 m)    Weight:   Wt Readings from Last 1 Encounters:  07/09/16 141 lb (64 kg)    Ideal Body Weight:  61 kg  BMI:  Body mass index is 22.08 kg/m.  Estimated Nutritional Needs:   Kcal:  1600-1900kcal/day   Protein:  77-90g/day   Fluid:  >1.6L/day   EDUCATION NEEDS:   Education needs no appropriate at this time  Koleen Distance, RD, [place] Pager #[phone]  [phone]

## 2016-07-10 NOTE — Progress Notes (Signed)
[facility]  [address] [place], [place] [address] Phone: [phone] FAX: [phone]   Parissa Chiao [phone] [birth year]    Problem List:   Principal Problem:   Incisional hernia s/p lap/open repair with mesh 07/09/2016 Active Problems:   Alcohol dependence ([place])   Major depression, recurrent ([place])   1 Day Post-Op  07/09/2016  Procedure(s): LAPAROSCOPIC LYSIS OF ADHESIONS  VENTRAL WALL HERNIA REPAIR WITH MESH EXCISION OF HERNIA SAC INSERTION OF MESH   Assessment  Recovering  Plan:  -adv diet -pain control -anxiolysis -VTE prophylaxis- SCDs, etc -mobilize as tolerated to help recovery  D/C patient from hospital when patient meets criteria (anticipate later today):  Tolerating oral intake well Ambulating well Adequate pain control without IV medications Urinating  Having flatus Disposition planning in place   Adin Hector, M.D., F.A.C.S. Gastrointestinal and Minimally Invasive Surgery [facility] [address] [place], [place] [address] [phone] Main / Paging   07/10/2016  CARE TEAM:  PCP: Georges Lynch, MD  Outpatient Care Team: Patient Care Team: Elberta Leatherwood, MD as PCP - General (Family Medicine) Michael Boston, MD as Consulting Physician (General Surgery)  Inpatient Treatment Team: Treatment Team: Attending Provider: Michael Boston, MD; Technician: Leda Quail, NT  Subjective:  Sore  Walked in hallways Tol PO Wants to go home  Objective:  Vital signs:  Vitals:   07/09/16 2109 07/09/16 2148 07/10/16 0058 07/10/16 0613  BP: 116/66 113/60 106/61 (!) 109/59  Pulse: 63 66 67 61  Resp: '13 13 15 16  '$ Temp: 97.4 F (36.3 C) 98.3 F (36.8 C) 97.9 F (36.6 C) 98 F (36.7 C)  TempSrc: Oral Oral Oral Oral  SpO2: 94% 96% 95% 96%  Weight:      Height:        Last BM Date: 07/09/16  Intake/Output   Yesterday:  03/22 0701 - 03/23  0700 In: 2575 [I.V.:2325; IV Piggyback:200] Out: 175 [Urine:150; Blood:25] This shift:  No intake/output data recorded.  Bowel function:  Flatus: YES  BM:  No  Drain: Serosanguinous   Physical Exam:  General: Pt awake/alert/oriented x4 inno acute distress Eyes: PERRL, normal EOM.  Sclera clear.  No icterus Neuro: CN II-XII intact w/o focal sensory/motor deficits. Lymph: No head/neck/groin lymphadenopathy Psych:  No delerium/psychosis/paranoia HENT: Normocephalic, Mucus membranes moist.  No thrush Neck: Supple, No tracheal deviation Chest: No chest wall pain w good excursion CV:  Pulses intact.  Regular rhythm MS: Normal AROM mjr joints.  No obvious deformity Abdomen: Soft.  Nondistended.  Mildly tender at incisions only.  No evidence of peritonitis.  No incarcerated hernias. Ext:  SCDs BLE.  No mjr edema.  No cyanosis Skin: No petechiae / purpura  Results:   Labs: Results for orders placed or performed during the hospital encounter of 07/08/16 (from the past 48 hour(s))  CBC     Status: Abnormal   Collection Time: 07/08/16  2:38 PM  Result Value Ref Range   WBC 10.8 (H) 4.0 - 10.5 K/uL   RBC 4.58 3.87 - 5.11 MIL/uL   Hemoglobin 14.2 12.0 - 15.0 g/dL   HCT 43.2 36.0 - 46.0 %   MCV 94.3 78.0 - 100.0 fL   MCH 31.0 26.0 - 34.0 pg   MCHC 32.9 30.0 - 36.0 g/dL   RDW 14.2 11.5 - 15.5 %   Platelets 328 150 - 400 K/uL  Comprehensive metabolic panel     Status: Abnormal   Collection  Time: 07/08/16  2:38 PM  Result Value Ref Range   Sodium 141 135 - 145 mmol/L   Potassium 4.0 3.5 - 5.1 mmol/L   Chloride 106 101 - 111 mmol/L   CO2 27 22 - 32 mmol/L   Glucose, Bld 139 (H) 65 - 99 mg/dL   BUN 10 6 - 20 mg/dL   Creatinine, Ser 0.77 0.44 - 1.00 mg/dL   Calcium 9.8 8.9 - 10.3 mg/dL   Total Protein 7.4 6.5 - 8.1 g/dL   Albumin 4.3 3.5 - 5.0 g/dL   AST 20 15 - 41 U/L   ALT 14 14 - 54 U/L   Alkaline Phosphatase 69 38 - 126 U/L   Total Bilirubin 0.5 0.3 - 1.2 mg/dL   GFR calc  non Af Amer >60 >60 mL/min   GFR calc Af Amer >60 >60 mL/min    Comment: (NOTE) The eGFR has been calculated using the CKD EPI equation. This calculation has not been validated in all clinical situations. eGFR's persistently <60 mL/min signify possible Chronic Kidney Disease.    Anion gap 8 5 - 15    Imaging / Studies: No results found.  Medications / Allergies: per chart  Antibiotics: Anti-infectives    Start     Dose/Rate Route Frequency Ordered Stop   07/09/16 2200  ceFAZolin (ANCEF) IVPB 2g/100 mL premix     2 g 200 mL/hr over 30 Minutes Intravenous Every 8 hours 07/09/16 1901 07/10/16 0548   07/09/16 1910  valACYclovir (VALTREX) tablet 1,000 mg     1,000 mg Oral Daily PRN 07/09/16 1910     07/09/16 1901  valACYclovir (VALTREX) tablet 1,000 mg  Status:  Discontinued     1,000 mg Oral Daily PRN 07/09/16 1901 07/09/16 1910   07/09/16 1245  ceFAZolin (ANCEF) IVPB 2g/100 mL premix     2 g 200 mL/hr over 30 Minutes Intravenous On call to O.R. 07/09/16 1228 07/09/16 1430        Note: Portions of this report may have been transcribed using voice recognition software. Every effort was made to ensure accuracy; however, inadvertent computerized transcription errors may be present.   Any transcriptional errors that result from this process are unintentional.     Adin Hector, M.D., F.A.C.S. Gastrointestinal and Minimally Invasive Surgery [facility] [address], [place], [place] [address] [phone] Main / Paging   07/10/2016

## 2016-07-15 MED FILL — HYDROCODON-APAP 5-325: 5-325 | 3 days supply | Qty: 20 | Fill #0

## 2016-07-20 ENCOUNTER — Encounter (HOSPITAL_COMMUNITY): Payer: Self-pay | Admitting: Surgery

## 2016-07-20 NOTE — Anesthesia Postprocedure Evaluation (Addendum)
Anesthesia Post Note  Patient: [name]  Procedure(s) Performed: Procedure(s) (LRB): LAPAROSCOPIC LYSIS OF ADHESIONS  VENTRAL WALL HERNIA REPAIR WITH MESH EXCISION OF HERNIA [place] (N/A) INSERTION OF MESH  Patient location during evaluation: PACU Anesthesia Type: General Level of consciousness: awake and alert and patient cooperative Pain management: pain level controlled Vital Signs Assessment: post-procedure vital signs reviewed and stable Respiratory status: spontaneous breathing and respiratory function stable Cardiovascular status: stable Anesthetic complications: no       Last Vitals:  Vitals:   07/10/16 0613 07/10/16 1400  BP: (!) 109/59 (!) 99/52  Pulse: 61 74  Resp: 16 18  Temp: 36.7 C 37 C    Last Pain:  Vitals:   07/10/16 1400  TempSrc: Oral  PainSc:                  [place] S

## 2016-07-27 MED FILL — oxyCODONE HCL 5 MG TABS: 5 | 4 days supply | Qty: 40 | Fill #0

## 2016-11-16 ENCOUNTER — Encounter (HOSPITAL_COMMUNITY): Payer: Self-pay | Admitting: Emergency Medicine

## 2016-11-16 ENCOUNTER — Ambulatory Visit (INDEPENDENT_AMBULATORY_CARE_PROVIDER_SITE_OTHER): Payer: BLUE CROSS/BLUE SHIELD | Admitting: Family Medicine

## 2016-11-16 ENCOUNTER — Telehealth: Payer: Self-pay | Admitting: Family Medicine

## 2016-11-16 ENCOUNTER — Emergency Department (HOSPITAL_COMMUNITY)
Admission: EM | Admit: 2016-11-16 | Discharge: 2016-11-16 | Disposition: A | Discharge status: Home / Self Care | Payer: BLUE CROSS/BLUE SHIELD | Attending: Emergency Medicine | Admitting: Emergency Medicine

## 2016-11-16 VITALS — BP 138/86 | HR 63 | Temp 98.2°F

## 2016-11-16 DIAGNOSIS — Z79899 Other long term (current) drug therapy: Secondary | ICD-10-CM | POA: Diagnosis not present

## 2016-11-16 DIAGNOSIS — R112 Nausea with vomiting, unspecified: Secondary | ICD-10-CM | POA: Insufficient documentation

## 2016-11-16 DIAGNOSIS — R1013 Epigastric pain: Secondary | ICD-10-CM | POA: Insufficient documentation

## 2016-11-16 DIAGNOSIS — R110 Nausea: Secondary | ICD-10-CM

## 2016-11-16 DIAGNOSIS — F17210 Nicotine dependence, cigarettes, uncomplicated: Secondary | ICD-10-CM | POA: Insufficient documentation

## 2016-11-16 DIAGNOSIS — K861 Other chronic pancreatitis: Secondary | ICD-10-CM | POA: Insufficient documentation

## 2016-11-16 LAB — URINALYSIS, ROUTINE W REFLEX MICROSCOPIC
Bilirubin Urine: NEGATIVE
Glucose, UA: NEGATIVE mg/dL
KETONES UR: NEGATIVE mg/dL
Nitrite: NEGATIVE
PROTEIN: NEGATIVE mg/dL
Specific Gravity, Urine: 1.002 — ABNORMAL LOW (ref 1.005–1.030)
pH: 6 (ref 5.0–8.0)

## 2016-11-16 LAB — COMPREHENSIVE METABOLIC PANEL
ALT: 14 U/L (ref 14–54)
ANION GAP: 7 (ref 5–15)
AST: 19 U/L (ref 15–41)
Albumin: 4.3 g/dL (ref 3.5–5.0)
Alkaline Phosphatase: 69 U/L (ref 38–126)
BILIRUBIN TOTAL: 0.4 mg/dL (ref 0.3–1.2)
BUN: 5 mg/dL — ABNORMAL LOW (ref 6–20)
CHLORIDE: 104 mmol/L (ref 101–111)
CO2: 27 mmol/L (ref 22–32)
Calcium: 9.8 mg/dL (ref 8.9–10.3)
Creatinine, Ser: 0.73 mg/dL (ref 0.44–1.00)
GFR calc Af Amer: >60 mL/min (ref >60)
Glucose, Bld: 128 mg/dL — ABNORMAL HIGH (ref 65–99)
Potassium: 3.6 mmol/L (ref 3.5–5.1)
Sodium: 138 mmol/L (ref 135–145)
Total Protein: 8.3 g/dL — ABNORMAL HIGH (ref 6.5–8.1)

## 2016-11-16 LAB — CBC
HCT: 44.1 % (ref 36.0–46.0)
HEMOGLOBIN: 15.2 g/dL — AB (ref 12.0–15.0)
MCH: 32.5 pg (ref 26.0–34.0)
MCHC: 34.5 g/dL (ref 30.0–36.0)
MCV: 94.2 fL (ref 78.0–100.0)
Platelets: 333 10*3/uL (ref 150–400)
RBC: 4.68 MIL/uL (ref 3.87–5.11)
RDW: 13.2 % (ref 11.5–15.5)
WBC: 11.3 10*3/uL — ABNORMAL HIGH (ref 4.0–10.5)

## 2016-11-16 LAB — LIPASE, BLOOD: Lipase: 28 U/L (ref 11–51)

## 2016-11-16 MED ORDER — SODIUM CHLORIDE 0.9 % IV BOLUS (SEPSIS)
1000.0000 mL | Freq: Once | INTRAVENOUS | Status: AC
  Administered 2016-11-16: 1000 mL via INTRAVENOUS

## 2016-11-16 MED ORDER — HYDROMORPHONE HCL 1 MG/ML IJ SOLN
1.0000 mg | Freq: Once | INTRAMUSCULAR | Status: AC
  Administered 2016-11-16: 1 mg via INTRAVENOUS
  Filled 2016-11-16: qty 1

## 2016-11-16 MED ORDER — HYDROCODONE-ACETAMINOPHEN 5-325 MG PO TABS: 1.0000 | ORAL_TABLET | Freq: Four times a day (QID) | ORAL | 0 refills | Status: DC | PRN

## 2016-11-16 MED ORDER — ONDANSETRON HCL 4 MG/2ML IJ SOLN
4.0000 mg | Freq: Once | INTRAMUSCULAR | Status: AC
Start: 2016-11-16 — End: 2016-11-16
  Administered 2016-11-16: 4 mg via INTRAVENOUS
  Filled 2016-11-16: qty 2

## 2016-11-16 MED ORDER — ONDANSETRON 4 MG PO TBDP: 4.0000 mg | ORAL_TABLET | Freq: Three times a day (TID) | ORAL | 0 refills | Status: DC | PRN

## 2016-11-16 NOTE — Progress Notes (Signed)
   Subjective:   [name] is a 61 y.o. female with a history of alcohol abuse, chronic pancreatitis, pancreaticolithiasis here for persisting abdominal pain following an ERCP with pancreatic stone removal and stent replacement on 11/09/2016.  She received Percocet on 7/23 for 3 days.  She states she has been eating "lighter foods" such as chicken broth until Sunday when she ate french toast which exacerbated her pain.  She stated she called her gastroenterologist for refill of her Percocet this morning but was told to see her primary gastroenterologist, PCP or go to the ER.  She elicits nausea and diarrhea but denies vomiting, SOB, fever.  Review of Systems:  Per HPI.   Social History: Current every day smoker  Objective:  BP 138/86   Pulse 63   Temp 98.2 F (36.8 C) (Oral)   LMP 12/08/2006   SpO2 96%   Gen:  61 y.o. female in mild distress HEENT: NCAT, MMM, anicteric sclerae CV: RRR, no MRG Resp: Non-labored, CTAB, no wheezes noted Abd: BS present, soft, tender to palpation in the epigastric region with slight guarding, no organomegaly  Ext: WWP, no edema MSK: Full ROM, strength intact Neuro: Alert and oriented, speech normal, gait normal, able to rise from chair to exam table without difficulty       Chemistry      Component Value Date/Time   NA 141 07/08/2016 1438   K 4.0 07/08/2016 1438   CL 106 07/08/2016 1438   CO2 27 07/08/2016 1438   BUN 10 07/08/2016 1438   CREATININE 0.77 07/08/2016 1438   CREATININE 0.63 09/21/2014 1206      Component Value Date/Time   CALCIUM 9.8 07/08/2016 1438   ALKPHOS 69 07/08/2016 1438   AST 20 07/08/2016 1438   ALT 14 07/08/2016 1438   BILITOT 0.5 07/08/2016 1438      Lab Results  Component Value Date   WBC 10.8 (H) 07/08/2016   HGB 14.2 07/08/2016   HCT 43.2 07/08/2016   MCV 94.3 07/08/2016   PLT 328 07/08/2016   Lab Results  Component Value Date   TSH 1.778 11/10/2014   Lab Results  Component Value Date   HGBA1C  5.3 11/10/2014   Assessment & Plan:     [name] is a 61 y.o. female here for   Chronic pancreatitis ([place]) Persistent pain since ERCP and pancreatic stent removal 11/09/2016.  Received Percocet 07/23 for 3 days and wants refill.  Called operating gastroenterologist to assess possible complications from ERCP before refilling.  Concerns include pain >3-4 days, alcohol dependence, and mood disorder. - await return call from operating gastroenterologist. - Diagnostic labs (LFTs, amylase, lipase, CBC) offered - patient refused. - Will call pt once receive feedback from gastroenterologist - informed patient there was no guarantee of pain med refill but wanted a clearer picture of what is going on before refill was given. - Advised that if pain is severe and/or symptoms worsen, to go to the ER.   Rory Percy, DO  PGY-1,  [facility] 11/16/2016  12:13 PM

## 2016-11-16 NOTE — Discharge Instructions (Signed)
As discussed, your evaluation today has been largely reassuring.  But, it is important that you monitor your condition carefully, and do not hesitate to return to the ED if you develop new, or concerning changes in your condition. ? ?Otherwise, please follow-up with your physician for appropriate ongoing care. ? ?

## 2016-11-16 NOTE — Telephone Encounter (Signed)
error 

## 2016-11-16 NOTE — ED Provider Notes (Signed)
[facility] DEPT Provider Note   CSN: [id number] Arrival date & time: 11/16/16  1356     History   Chief Complaint Chief Complaint  Patient presents with  . Abdominal Pain    pancreas    HPI [name] is a 61 y.o. female.  HPI  Patient presents with concern of abdominal pain. Pain began 4 days ago, since onset has been persistent with associated nausea, vomiting, anorexia. Last bowel movement was today. Patient has history of chronic pancreatitis, had ERCP performed one week ago. She notes that she has had prior ERCP, has had similar pain following that procedure. Now, with ongoing pain, nausea, she presents for evaluation. Since onset no clear alleviating or exacerbating factors, and the patient has not been able to tolerate oral intake.     Past Medical History:  Diagnosis Date  . Alcohol abuse    H/o withdrawal   . Alcohol withdrawal ([place]) 10/02/2013  . Anxiety   . Chronic diarrhea    hx of  . Constipation   . Depression   . Elevated liver function tests few yrs ago  . Pancreatitis   . PONV (postoperative nausea and vomiting)   . Rosacea   . Wears glasses     Patient Active Problem List   Diagnosis Date Noted  . Incisional hernia s/p lap/open repair with mesh 07/09/2016 07/09/2016  . Chronic pancreatitis ([place]) 04/18/2016  . Pancreatic calcification 04/03/2016  . Abdominal pain, right upper quadrant 04/03/2016  . Major depressive disorder, recurrent, severe without psychotic features ([place])   . Substance induced mood disorder ([place]) 11/06/2014  . Bilateral knee pain 10/14/2014  . Alcohol use disorder, severe, dependence ([place]) 08/17/2014  . Suicidal behavior   . Alcohol dependence ([place]) 10/02/2013  . Major depression, recurrent ([place]) 10/02/2013  . Gastric ulcer 01/20/2012  . Fatty liver 10/11/2011  . 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 Elevated liver function tests   . GERD (gastroesophageal reflux disease)   . ESOPHAGEAL MOTILITY DISORDER  10/01/2008  . HIATAL HERNIA 10/01/2008  . FATTY LIVER DISEASE 10/01/2008    Past Surgical History:  Procedure Laterality Date  . CHOLECYSTECTOMY    . COLONOSCOPY  Never  . DIAGNOSTIC MAMMOGRAM  2008  . ERCP  10/06/2011   Procedure: ENDOSCOPIC RETROGRADE CHOLANGIOPANCREATOGRAPHY (ERCP);  Surgeon: Beryle Beams, MD;  Location: [facility];  Service: Endoscopy;  Laterality: N/A;  . ESOPHAGOGASTRODUODENOSCOPY  03/21/2011   Procedure: ESOPHAGOGASTRODUODENOSCOPY (EGD);  Surgeon: Scarlette Shorts, MD;  Location: [name] ENDOSCOPY;  Service: Endoscopy;  Laterality: N/A;  Patient may need to be done at bedside tomorrow depending on status  . ESOPHAGOGASTRODUODENOSCOPY  01/06/2012   Procedure: ESOPHAGOGASTRODUODENOSCOPY (EGD);  Surgeon: Wonda Horner, MD;  Location: [facility];  Service: Endoscopy;  Laterality: N/A;  bedside  . INSERTION OF MESH  07/09/2016   Procedure: INSERTION OF MESH;  Surgeon: Michael Boston, MD;  Location: [facility]S;  Service: General;;  . LAPAROTOMY  01/03/2012   Procedure: EXPLORATORY LAPAROTOMY;  Surgeon: Pedro Earls, MD;  Location: [facility];  Service: General;  Laterality: N/A;  debridement of necrotic pancreas and placement of drains x2  . pancreatic cyst removed   2015  . SHOULDER SURGERY Left 02/18/2010   clavical repair  . TUBAL LIGATION    . VENTRAL HERNIA REPAIR N/A 07/09/2016   Procedure: LAPAROSCOPIC LYSIS OF ADHESIONS  VENTRAL WALL HERNIA REPAIR WITH MESH EXCISION OF HERNIA [place];  Surgeon: Michael Boston, MD;  Location: [facility];  Service: General;  Laterality: N/A;  OB History    No data available       Home Medications    Prior to Admission medications   Medication Sig Start Date End Date Taking? Authorizing Provider  amitriptyline (ELAVIL) 25 MG tablet 1 tablet in the morning. Take 2 tablets at night. Patient taking differently: Take 25 mg by mouth 2 (two) times daily.  06/19/16   McKeag, Marylynn Pearson, MD  Calcium Carbonate-Vitamin D (CALCIUM 600+D PO) Take 1 tablet by mouth  daily.    [provider]  gabapentin (NEURONTIN) 300 MG capsule Take 900 mg by mouth 3 (three) times daily.  05/25/16   [provider]  HYDROcodone-acetaminophen (NORCO/VICODIN) 5-325 MG tablet Take 1 tablet by mouth every 6 (six) hours as needed for severe pain. 11/16/16   Carmin Muskrat, MD  methocarbamol (ROBAXIN) 500 MG tablet Take 1-2 tablets (500-1,000 mg total) by mouth every 6 (six) hours as needed for muscle spasms. 07/09/16   Michael Boston, MD  Multiple Vitamin (MULTIVITAMIN WITH MINERALS) TABS tablet Take 1 tablet by mouth daily.    [provider]  nicotine (NICODERM CQ - DOSED IN MG/24 HR) 7 mg/24hr patch Place 7 mg onto the skin as needed.    [provider]  ondansetron (ZOFRAN ODT) 4 MG disintegrating tablet Take 1 tablet (4 mg total) by mouth every 8 (eight) hours as needed for nausea or vomiting. 11/16/16   Carmin Muskrat, MD  valACYclovir (VALTREX) 1000 MG tablet Take 1,000 mg by mouth daily as needed (fever blisters).  04/10/15   [provider]    Family History Family History  Problem Relation Age of Onset  . Stroke Mother   . Heart disease Father   . Heart failure Father   . Heart disease Sister     Social History Social History  Substance Use Topics  . Smoking status: Light Tobacco Smoker    Packs/day: 0.25    Years: 15.00    Types: Cigarettes  . Smokeless tobacco: Never Used     Comment: trying nicotine patch  . Alcohol use No     Comment: quit 2016     Allergies   Ciprofloxacin   Review of Systems Review of Systems  Constitutional:       Per HPI, otherwise negative  HENT:       Per HPI, otherwise negative  Respiratory:       Per HPI, otherwise negative  Cardiovascular:       Per HPI, otherwise negative  Gastrointestinal: Positive for abdominal pain, nausea and vomiting.  Endocrine:       Negative aside from HPI  Genitourinary:       Neg aside from HPI   Musculoskeletal:       Per HPI,  otherwise negative  Skin: Negative.   Neurological: Negative for syncope.     Physical Exam Updated Vital Signs BP 138/74   Pulse 69   Temp 98.5 F (36.9 C) (Oral)   Resp 15   Ht 5\' 6"  (1.676 m)   Wt 67.6 kg (149 lb)   LMP 12/08/2006   SpO2 97%   BMI 24.05 kg/m   Physical Exam  Constitutional: She is oriented to person, place, and time. She appears well-developed and well-nourished. No distress.  HENT:  Head: Normocephalic and atraumatic.  Eyes: Conjunctivae and EOM are normal.  Cardiovascular: Normal rate and regular rhythm.   Pulmonary/Chest: Effort normal and breath sounds normal. No stridor. No respiratory distress.  Abdominal: She exhibits no distension.  Minimal abdominal discomfort about the epigastrium, no guarding, no rebound, no peritoneal findings  Musculoskeletal: She exhibits no edema.  Neurological: She is alert and oriented to person, place, and time. No cranial nerve deficit.  Skin: Skin is warm and dry.  Psychiatric: She has a normal mood and affect.  Nursing note and vitals reviewed.    ED Treatments / Results  Labs (all labs ordered are listed, but only abnormal results are displayed) Labs Reviewed  COMPREHENSIVE METABOLIC PANEL - Abnormal; Notable for the following:       Result Value   Glucose, Bld 128 (*)    BUN 5 (*)    Total Protein 8.3 (*)    All other components within normal limits  CBC - Abnormal; Notable for the following:    WBC 11.3 (*)    Hemoglobin 15.2 (*)    All other components within normal limits  URINALYSIS, ROUTINE W REFLEX MICROSCOPIC - Abnormal; Notable for the following:    Color, Urine STRAW (*)    Specific Gravity, Urine 1.002 (*)    Hgb urine dipstick SMALL (*)    Leukocytes, UA TRACE (*)    Bacteria, UA RARE (*)    Squamous Epithelial / LPF 0-5 (*)    All other components within normal limits  LIPASE, BLOOD    Procedures Procedures (including critical care time)  Medications Ordered in ED Medications    sodium chloride 0.9 % bolus 1,000 mL (0 mLs Intravenous Stopped 11/16/16 1917)  ondansetron (ZOFRAN) injection 4 mg (4 mg Intravenous Given 11/16/16 1738)  HYDROmorphone (DILAUDID) injection 1 mg (1 mg Intravenous Given 11/16/16 1738)     Initial Impression / Assessment and Plan / ED Course  I have reviewed the triage vital signs and the nursing notes.  Pertinent labs & imaging results that were available during my care of the patient were reviewed by me and considered in my medical decision making (see chart for details).  Following one liter fluid resuscitation, and provision of analgesics, antiemetics the patient has improved substantially. She sitting upright, tolerating oral intake. We discussed the importance of following up with her gastroenterologist at [name], as well as need to be careful for constipation given the ongoing use of narcotic pain medication. Patient discharged in stable condition given the absence of findings consistent with peritonitis, infection, or other acute new pathology.   Final Clinical Impressions(s) / ED Diagnoses   Final diagnoses:  Epigastric pain  Nausea    New Prescriptions Discharge Medication List as of 11/16/2016  6:59 PM    START taking these medications   Details  HYDROcodone-acetaminophen (NORCO/VICODIN) 5-325 MG tablet Take 1 tablet by mouth every 6 (six) hours as needed for severe pain., Starting Mon 11/16/2016, Print         Carmin Muskrat, MD 11/16/16 Lurena Nida

## 2016-11-16 NOTE — ED Notes (Signed)
Pt given a ginger ale, per Dr. Vanita Panda.

## 2016-11-16 NOTE — Assessment & Plan Note (Addendum)
Persistent pain since ERCP and pancreatic stent removal 11/09/2016.  Received Percocet 07/23 for 3 days and wants refill.  Called operating gastroenterologist to assess possible complications from ERCP before refilling.  Concerns include pain >3-4 days, alcohol dependence, and mood disorder. - await return call from operating gastroenterologist. - Diagnostic labs (LFTs, amylase, lipase, CBC) offered - patient refused. - Will call pt once receive feedback from gastroenterologist - informed patient there was no guarantee of pain med refill but wanted a clearer picture of what is going on before refill was given. - Advised that if pain is severe and/or symptoms worsen, to go to the ER.

## 2016-11-16 NOTE — ED Triage Notes (Signed)
Pt. Stated, I hAD A STENT PUT INTO MY PANCREAS LAST Monday AND Thursday I STARTED HAVING BAD ABDOMINAL PAIN WHICH IS in [facility].  I tried to call my Primary care doctor and they were not willing to help. I need something for pain. My GI doctor is in [place], but Im in too much pain to drive that far.  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

## 2016-12-07 ENCOUNTER — Telehealth: Payer: Self-pay | Admitting: *Deleted

## 2016-12-07 NOTE — Telephone Encounter (Addendum)
FYI, patient has already been referred to pain management in Jan 2018 and was declined by both Dr. Brandy Hale and every physician at [facility].

## 2016-12-07 NOTE — Telephone Encounter (Signed)
[name] with [facility] left voice message on nurse line requesting someone give her a call back. They would like to discuss a referral to a pain clinic for patient. Please call [phone].  Derl Barrow, RN

## 2016-12-09 NOTE — Addendum Note (Signed)
Addendum  created 12/09/16 1347 by Albertha Ghee, MD   Sign clinical note

## 2016-12-14 NOTE — Telephone Encounter (Signed)
Have tried to get in touch with [name] and left two VMs. Will address this once I hear back.

## 2016-12-25 ENCOUNTER — Encounter (HOSPITAL_COMMUNITY): Payer: Self-pay | Admitting: Emergency Medicine

## 2016-12-25 ENCOUNTER — Telehealth: Payer: Self-pay | Admitting: Family Medicine

## 2016-12-25 ENCOUNTER — Emergency Department (HOSPITAL_COMMUNITY)
Admission: EM | Admit: 2016-12-25 | Discharge: 2016-12-25 | Disposition: A | Discharge status: Home / Self Care | Payer: BLUE CROSS/BLUE SHIELD | Attending: Emergency Medicine | Admitting: Emergency Medicine

## 2016-12-25 DIAGNOSIS — R109 Unspecified abdominal pain: Secondary | ICD-10-CM | POA: Insufficient documentation

## 2016-12-25 DIAGNOSIS — F17210 Nicotine dependence, cigarettes, uncomplicated: Secondary | ICD-10-CM | POA: Insufficient documentation

## 2016-12-25 DIAGNOSIS — F10188 Alcohol abuse with other alcohol-induced disorder: Secondary | ICD-10-CM | POA: Diagnosis not present

## 2016-12-25 DIAGNOSIS — Z79899 Other long term (current) drug therapy: Secondary | ICD-10-CM | POA: Insufficient documentation

## 2016-12-25 LAB — URINALYSIS, ROUTINE W REFLEX MICROSCOPIC
Bilirubin Urine: NEGATIVE
Glucose, UA: NEGATIVE mg/dL
Hgb urine dipstick: NEGATIVE
Ketones, ur: NEGATIVE mg/dL
Leukocytes, UA: NEGATIVE
Nitrite: NEGATIVE
Protein, ur: NEGATIVE mg/dL
Specific Gravity, Urine: 1.008 (ref 1.005–1.030)
pH: 6 (ref 5.0–8.0)

## 2016-12-25 LAB — COMPREHENSIVE METABOLIC PANEL
ALT: 13 U/L — ABNORMAL LOW (ref 14–54)
AST: 19 U/L (ref 15–41)
Albumin: 3.7 g/dL (ref 3.5–5.0)
Alkaline Phosphatase: 59 U/L (ref 38–126)
Anion gap: 9 (ref 5–15)
BUN: 12 mg/dL (ref 6–20)
CO2: 24 mmol/L (ref 22–32)
Calcium: 9.2 mg/dL (ref 8.9–10.3)
Chloride: 105 mmol/L (ref 101–111)
Creatinine, Ser: 0.75 mg/dL (ref 0.44–1.00)
GFR calc Af Amer: >60 mL/min (ref >60)
GFR calc non Af Amer: >60 mL/min (ref >60)
Glucose, Bld: 97 mg/dL (ref 65–99)
Potassium: 4.1 mmol/L (ref 3.5–5.1)
Sodium: 138 mmol/L (ref 135–145)
Total Bilirubin: 0.6 mg/dL (ref 0.3–1.2)
Total Protein: 6.8 g/dL (ref 6.5–8.1)

## 2016-12-25 LAB — CBC WITH DIFFERENTIAL/PLATELET
Basophils Absolute: 0 10*3/uL (ref 0.0–0.1)
Basophils Relative: 0 %
Eosinophils Absolute: 0.2 10*3/uL (ref 0.0–0.7)
Eosinophils Relative: 1 %
HCT: 40.7 % (ref 36.0–46.0)
Hemoglobin: 13.4 g/dL (ref 12.0–15.0)
Lymphocytes Relative: 22 %
Lymphs Abs: 2.6 10*3/uL (ref 0.7–4.0)
MCH: 31.5 pg (ref 26.0–34.0)
MCHC: 32.9 g/dL (ref 30.0–36.0)
MCV: 95.8 fL (ref 78.0–100.0)
Monocytes Absolute: 0.9 10*3/uL (ref 0.1–1.0)
Monocytes Relative: 8 %
Neutro Abs: 8.1 10*3/uL — ABNORMAL HIGH (ref 1.7–7.7)
Neutrophils Relative %: 69 %
Platelets: 317 10*3/uL (ref 150–400)
RBC: 4.25 MIL/uL (ref 3.87–5.11)
RDW: 12.8 % (ref 11.5–15.5)
WBC: 11.8 10*3/uL — ABNORMAL HIGH (ref 4.0–10.5)

## 2016-12-25 LAB — LIPASE, BLOOD: Lipase: 47 U/L (ref 11–51)

## 2016-12-25 MED ORDER — HYDROCODONE-ACETAMINOPHEN 5-325 MG PO TABS: 1.0000 | ORAL_TABLET | Freq: Four times a day (QID) | ORAL | 0 refills | Status: DC | PRN

## 2016-12-25 MED ORDER — GI COCKTAIL ~~LOC~~
30.0000 mL | Freq: Once | ORAL | Status: AC
  Administered 2016-12-25: 30 mL via ORAL
  Filled 2016-12-25: qty 30

## 2016-12-25 MED ORDER — SODIUM CHLORIDE 0.9 % IV BOLUS (SEPSIS)
1000.0000 mL | Freq: Once | INTRAVENOUS | Status: AC
  Administered 2016-12-25: 1000 mL via INTRAVENOUS

## 2016-12-25 MED ORDER — HYDROMORPHONE HCL 1 MG/ML IJ SOLN
1.0000 mg | Freq: Once | INTRAMUSCULAR | Status: AC
  Administered 2016-12-25: 1 mg via INTRAVENOUS
  Filled 2016-12-25: qty 1

## 2016-12-25 MED ORDER — FAMOTIDINE IN NACL 20-0.9 MG/50ML-% IV SOLN
20.0000 mg | Freq: Once | INTRAVENOUS | Status: AC
  Administered 2016-12-25: 20 mg via INTRAVENOUS
  Filled 2016-12-25: qty 50

## 2016-12-25 MED ORDER — ONDANSETRON HCL 4 MG/2ML IJ SOLN
4.0000 mg | Freq: Once | INTRAMUSCULAR | Status: AC
  Administered 2016-12-25: 4 mg via INTRAVENOUS
  Filled 2016-12-25: qty 2

## 2016-12-25 MED ORDER — FAMOTIDINE 20 MG PO TABS: 20.0000 mg | ORAL_TABLET | Freq: Two times a day (BID) | ORAL | 0 refills | Status: DC

## 2016-12-25 NOTE — ED Provider Notes (Signed)
[facility] DEPT Provider Note   CSN: [id number] Arrival date & time: 12/25/16  3086     History   Chief Complaint Chief Complaint  Patient presents with  . Abdominal Pain    HPI [name] is a 61 y.o. female.  HPI   46yF Female with abdominal pain. Acute on chronic. Discomfort. Past history of alcohol abuse, pancreatitis and pancreatic stenting. Care has been on multiple different medical facilities. She states worsening symptoms past to 3 days. Pain is diffuse, worse in the upper abdomen. Doesn't lateralize. Associated with nausea. No fevers or chills. No urinary complaints. No diarrhea. She reports that she scheduled for an ERCP in [place] at the end of the month.  Past Medical History:  Diagnosis Date  . Alcohol abuse    H/o withdrawal   . Alcohol withdrawal ([place]) 10/02/2013  . Anxiety   . Chronic diarrhea    hx of  . Constipation   . Depression   . Elevated liver function tests few yrs ago  . Pancreatitis   . PONV (postoperative nausea and vomiting)   . Rosacea   . Wears glasses     Patient Active Problem List   Diagnosis Date Noted  . Incisional hernia s/p lap/open repair with mesh 07/09/2016 07/09/2016  . Chronic pancreatitis ([place]) 04/18/2016  . Pancreatic calcification 04/03/2016  . Abdominal pain, right upper quadrant 04/03/2016  . Major depressive disorder, recurrent, severe without psychotic features ([place])   . Substance induced mood disorder ([place]) 11/06/2014  . Bilateral knee pain 10/14/2014  . Alcohol use disorder, severe, dependence ([place]) 08/17/2014  . Suicidal behavior   . Alcohol dependence ([place]) 10/02/2013  . Major depression, recurrent ([place]) 10/02/2013  . Gastric ulcer 01/20/2012  . Fatty liver 10/11/2011  . 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 Elevated liver function tests   . GERD (gastroesophageal reflux disease)   . ESOPHAGEAL MOTILITY DISORDER 10/01/2008  . HIATAL HERNIA 10/01/2008  . FATTY LIVER DISEASE 10/01/2008     Past Surgical History:  Procedure Laterality Date  . CHOLECYSTECTOMY    . COLONOSCOPY  Never  . DIAGNOSTIC MAMMOGRAM  2008  . ERCP  10/06/2011   Procedure: ENDOSCOPIC RETROGRADE CHOLANGIOPANCREATOGRAPHY (ERCP);  Surgeon: Beryle Beams, MD;  Location: [facility];  Service: Endoscopy;  Laterality: N/A;  . ESOPHAGOGASTRODUODENOSCOPY  03/21/2011   Procedure: ESOPHAGOGASTRODUODENOSCOPY (EGD);  Surgeon: Scarlette Shorts, MD;  Location[facility]COPY;  Service: Endoscopy;  Laterality: N/A;  Patient may need to be done at bedside tomorrow depending on status  . ESOPHAGOGASTRODUODENOSCOPY  01/06/2012   Procedure: ESOPHAGOGASTRODUODENOSCOPY (EGD);  Surgeon: Wonda Horner, MD;  Location: [facility];  Service: Endoscopy;  Laterality: N/A;  bedside  . INSERTION OF MESH  07/09/2016   Procedure: INSERTION OF MESH;  Surgeon: Michael Boston, MD;  Location: [facility];  Service: General;;  . LAPAROTOMY  01/03/2012   Procedure: EXPLORATORY LAPAROTOMY;  Surgeon: Pedro Earls, MD;  Location: [facility];  Service: General;  Laterality: N/A;  debridement of necrotic pancreas and placement of drains x2  . pancreatic cyst removed   2015  . SHOULDER SURGERY Left 02/18/2010   clavical repair  . TUBAL LIGATION    . VENTRAL HERNIA REPAIR N/A 07/09/2016   Procedure: LAPAROSCOPIC LYSIS OF ADHESIONS  VENTRAL WALL HERNIA REPAIR WITH MESH EXCISION OF HERNIA [place];  Surgeon: Michael Boston, MD;  Location: [facility];  Service: General;  Laterality: N/A;    OB History    No data available  Home Medications    Prior to Admission medications   Medication Sig Start Date End Date Taking? Authorizing Provider  amitriptyline (ELAVIL) 25 MG tablet 1 tablet in the morning. Take 2 tablets at night. Patient taking differently: Take 25 mg by mouth 2 (two) times daily.  06/19/16   McKeag, Marylynn Pearson, MD  Calcium Carbonate-Vitamin D (CALCIUM 600+D PO) Take 1 tablet by mouth daily.    [provider]  gabapentin (NEURONTIN) 300 MG capsule  Take 900 mg by mouth 3 (three) times daily.  05/25/16   [provider]  HYDROcodone-acetaminophen (NORCO/VICODIN) 5-325 MG tablet Take 1 tablet by mouth every 6 (six) hours as needed for severe pain. 11/16/16   Carmin Muskrat, MD  methocarbamol (ROBAXIN) 500 MG tablet Take 1-2 tablets (500-1,000 mg total) by mouth every 6 (six) hours as needed for muscle spasms. 07/09/16   Michael Boston, MD  Multiple Vitamin (MULTIVITAMIN WITH MINERALS) TABS tablet Take 1 tablet by mouth daily.    [provider]  nicotine (NICODERM CQ - DOSED IN MG/24 HR) 7 mg/24hr patch Place 7 mg onto the skin as needed.    [provider]  ondansetron (ZOFRAN ODT) 4 MG disintegrating tablet Take 1 tablet (4 mg total) by mouth every 8 (eight) hours as needed for nausea or vomiting. 11/16/16   Carmin Muskrat, MD  valACYclovir (VALTREX) 1000 MG tablet Take 1,000 mg by mouth daily as needed (fever blisters).  04/10/15   [provider]    Family History Family History  Problem Relation Age of Onset  . Stroke Mother   . Heart disease Father   . Heart failure Father   . Heart disease Sister     Social History Social History  Substance Use Topics  . Smoking status: Light Tobacco Smoker    Packs/day: 0.25    Years: 15.00    Types: Cigarettes  . Smokeless tobacco: Never Used     Comment: trying nicotine patch  . Alcohol use No     Comment: quit 2016     Allergies   Ciprofloxacin   Review of Systems Review of Systems  All systems reviewed and negative, other than as noted in HPI.  Physical Exam Updated Vital Signs BP 126/75 (BP Location: Right Arm)   Pulse 64   Temp 98.9 F (37.2 C) (Oral)   Resp 17   LMP 12/08/2006   SpO2 96%   Physical Exam  Constitutional: She appears well-developed and well-nourished. No distress.  HENT:  Head: Normocephalic and atraumatic.  Eyes: Conjunctivae are normal. Right eye exhibits no discharge. Left eye exhibits no discharge.  Neck:  Neck supple.  Cardiovascular: Normal rate, regular rhythm and normal heart sounds.  Exam reveals no gallop and no friction rub.   No murmur heard. Pulmonary/Chest: Effort normal and breath sounds normal. No respiratory distress.  Abdominal: Soft. She exhibits no distension. There is no tenderness.  Mild diffuse tenderness without rebound or guarding. No distention.  Musculoskeletal: She exhibits no edema or tenderness.  Neurological: She is alert.  Skin: Skin is warm and dry.  Psychiatric: She has a normal mood and affect. Her behavior is normal. Thought content normal.  Nursing note and vitals reviewed.    ED Treatments / Results  Labs (all labs ordered are listed, but only abnormal results are displayed) Labs Reviewed  COMPREHENSIVE METABOLIC PANEL - Abnormal; Notable for the following:       Result Value   ALT 13 (*)    All other  components within normal limits  CBC WITH DIFFERENTIAL/PLATELET - Abnormal; Notable for the following:    WBC 11.8 (*)    Neutro Abs 8.1 (*)    All other components within normal limits  LIPASE, BLOOD  URINALYSIS, ROUTINE W REFLEX MICROSCOPIC    EKG  EKG Interpretation None       Radiology No results found.  Procedures Procedures (including critical care time)  Medications Ordered in ED Medications  sodium chloride 0.9 % bolus 1,000 mL (1,000 mLs Intravenous Transfusing/Transfer 12/25/16 0934)  gi cocktail (Maalox,Lidocaine,Donnatal) (30 mLs Oral Given 12/25/16 0825)  HYDROmorphone (DILAUDID) injection 1 mg (1 mg Intravenous Given 12/25/16 0823)  ondansetron (ZOFRAN) injection 4 mg (4 mg Intravenous Given 12/25/16 0823)  famotidine (PEPCID) IVPB 20 mg premix (0 mg Intravenous Stopped 12/25/16 0926)     Initial Impression / Assessment and Plan / ED Course  I have reviewed the triage vital signs and the nursing notes.  Pertinent labs & imaging results that were available during my care of the patient were reviewed by me and considered in my  medical decision making (see chart for details).     61 year old female with what sounds to be acute on chronic abdominal pain. I doubt emergent condition. Symptom improved after pain medicine. Patient asking several times for prescription for additional pain medicine. Advised that with the chronicity of her complaints, that this needs to be done by PCP or pain management.   Final Clinical Impressions(s) / ED Diagnoses   Final diagnoses:  Abdominal pain, unspecified abdominal location    New Prescriptions Discharge Medication List as of 12/25/2016 10:37 AM    START taking these medications   Details  famotidine (PEPCID) 20 MG tablet Take 1 tablet (20 mg total) by mouth 2 (two) times daily., Starting Fri 12/25/2016, Print         Virgel Manifold, MD 01/12/17 [phone]

## 2016-12-25 NOTE — ED Triage Notes (Addendum)
Lower abd pain since having sent placed in pancrease for stones,  In July, at [facility], , was seen  Here for same  pain end of July , and is scheduled for ERCP in [place], THE 23 OF SEP, pain is really bad

## 2016-12-25 NOTE — Telephone Encounter (Signed)
Pt called to inform the doctor that is in the ER now, with pain. jw

## 2016-12-25 NOTE — Telephone Encounter (Signed)
Pt is in ED now because of pain.( pancretitis). She says she will be given pain meds but they will not last until Friday Sept 14. She would like to have some pain meds to get her to next drs appt.

## 2016-12-25 NOTE — Telephone Encounter (Signed)
FYI to PCP

## 2016-12-28 NOTE — Telephone Encounter (Signed)
Called patient today. She states she is doing "okay, better because the ER gave me pain medication." Discussed that she should keep her appt with PCP on Friday to address her concerns. No refills given at this time since per chart review, patient was informed on 05/20/16 that no further pain medication would be given at this clinic due to a + THC on UDS.

## 2016-12-31 ENCOUNTER — Telehealth: Payer: Self-pay | Admitting: Family Medicine

## 2016-12-31 NOTE — Telephone Encounter (Signed)
Pt was called today because we will be closed 01/01/17 due to the [facility] and she was coming in to get pain medication to last until her surgery on the 24 th. She would like the doctor to write a prescription and either leave up front or call in . Please call her once this is completed or if this can not be done. jw

## 2016-12-31 NOTE — Telephone Encounter (Signed)
Left message for patient that unfortunately cannot give refills at this time although I do appreciate her difficult situation and do wish that I could help her, that refills would not be appropriate. I did not state this in the message but per chart review, she was informed by previous PCP Dr. Alease Frame on 05/20/16 that no further pain medication would be given at this clinic due to a + THC on UDS. Since I am not her PCP and her current PCP has not been prescribing her pain medication, no refills from this clinic at this time.

## 2017-01-01 ENCOUNTER — Ambulatory Visit: Payer: BLUE CROSS/BLUE SHIELD | Admitting: Family Medicine

## 2017-02-25 ENCOUNTER — Other Ambulatory Visit: Payer: Self-pay

## 2017-02-25 ENCOUNTER — Ambulatory Visit (INDEPENDENT_AMBULATORY_CARE_PROVIDER_SITE_OTHER): Payer: BLUE CROSS/BLUE SHIELD | Admitting: Internal Medicine

## 2017-02-25 ENCOUNTER — Encounter: Payer: Self-pay | Admitting: Internal Medicine

## 2017-02-25 DIAGNOSIS — Z720 Tobacco use: Secondary | ICD-10-CM | POA: Insufficient documentation

## 2017-02-25 DIAGNOSIS — M5431 Sciatica, right side: Secondary | ICD-10-CM

## 2017-02-25 HISTORY — DX: Sciatica, right side: M54.31

## 2017-02-25 MED ORDER — VARENICLINE TARTRATE 0.5 MG X 11 & 1 MG X 42 PO MISC: ORAL | 0 refills | Status: DC

## 2017-02-25 MED ORDER — GABAPENTIN 100 MG PO CAPS: 100.0000 mg | ORAL_CAPSULE | Freq: Three times a day (TID) | ORAL | 1 refills | Status: DC

## 2017-02-25 MED FILL — CHANTIX STARTING MONTH BOX: 0.5 MG X 11 | 30 days supply | Qty: 53 | Fill #0

## 2017-02-25 NOTE — Patient Instructions (Signed)
It was nice meeting you today [name]!  Please begin taking gabapentin 100mg  (one tablet) three times a day. After 3 days, you can increase to 200 mg (two tablets) three times a day. After 3 more days, you can increase to 300 mg (three tablets) three times a day. I will see you back in one month, and we can switch you to 300 mg tablets at that time if it is working well for you so you will not have to take so many pills each day.   Please begin taking Chantix to help with your smoking cessation. Follow the instructions on the package.   I will see you back in one month to check on your back pain and to see how quitting smoking is going.   If you have any questions or concerns, please feel free to call the clinic.   Be well,  Dr. Avon Gully

## 2017-02-25 NOTE — Assessment & Plan Note (Signed)
Patient wishing to quit smoking. Is very motivated to do so. Is interested in trying Chantix. Discussed that Chantix is safe to take despite her history of depression and addiction.  - Begin Chantix starter pack  - F/u in one month

## 2017-02-25 NOTE — Progress Notes (Signed)
   Subjective:   Patient: [name]       Birthdate: 02-28-1956       MRN: [id number]      HPI  [name] is a 61 y.o. female presenting for back pain and to discuss smoking cessation.   Back pain Began about a month ago. Gradual onset. No trauma. Pain is only located on R and shoots down R leg. Pain in back is now constant and mostly dull like a toothache. Does not improve or worsen with different positioning. Takes ibuprofen which does help. No longer takes any medications she was previously prescribed, including gabapentin, amitriptyline, or Norco. She has tried stretching which has not helped much. She is still able to do her normal daily activities, however the pain is always present. Denies numbness, weakness, tingling, bowel or bladder incontinence.   Smoking cessation Patient would like to stop smoking. She noticed recently that she gets out of breath very easily when doing activities that previously did not affect her. She smoked when she was younger, then stopped for about 30 years after the birth of her son. Over the past 3 years she has started smoking again. Started smoking after the death of her mother, as this was a very stressful time for her. She quit smoking the first time using nicotine patches, but says she was very motivated to stop due to the birth of her son and is not sure how much the patches contributed. She is currently smoking 1 ppd.   Smoking status reviewed. Patient is current every day smoker.   Review of Systems See HPI.     Objective:  Physical Exam  Constitutional: She is oriented to person, place, and time and well-developed, well-nourished, and in no distress.  HENT:  Head: Normocephalic.  Eyes: Conjunctivae and EOM are normal. Right eye exhibits no discharge. Left eye exhibits no discharge.  Cardiovascular: Normal rate.  Pulmonary/Chest: Effort normal. No respiratory distress.  Musculoskeletal:  No TTP of back, buttocks, or leg. 5/5  strength lower extremities bilaterally. Able to walk, sit, and stand without assistance or difficulty.   Neurological: She is alert and oriented to person, place, and time.  Skin: Skin is warm and dry.  Psychiatric: Affect and judgment normal.      Assessment & Plan:  Sciatica of right side Pain most consistent with sciatica. No reported weakness or red flags, and no neuro deficits on exam today. Patient previously taking gabapentin and amitriptyline for other issues, however has not been taking. Ibuprofen provides some relief, however patient having to take frequently. Patient amenable to resuming gabapentin.  - Begin gabapentin with taper up to 300mg  TID (dose patient was taking before) - F/u in one month. Can increase gabapentin further if necessary and patient tolerating well  Tobacco abuse Patient wishing to quit smoking. Is very motivated to do so. Is interested in trying Chantix. Discussed that Chantix is safe to take despite her history of depression and addiction.  - Begin Chantix starter pack  - F/u in one month   Adin Hector, MD, MPH PGY-3 [facility] Pager [phone]

## 2017-02-25 NOTE — Assessment & Plan Note (Signed)
Pain most consistent with sciatica. No reported weakness or red flags, and no neuro deficits on exam today. Patient previously taking gabapentin and amitriptyline for other issues, however has not been taking. Ibuprofen provides some relief, however patient having to take frequently. Patient amenable to resuming gabapentin.  - Begin gabapentin with taper up to 300mg  TID (dose patient was taking before) - F/u in one month. Can increase gabapentin further if necessary and patient tolerating well

## 2017-03-02 ENCOUNTER — Telehealth: Payer: Self-pay | Admitting: *Deleted

## 2017-03-02 NOTE — Telephone Encounter (Signed)
Patient left message on nurse line requesting return call from PCP. Saw provider on 02/25/2017 for back pain now with right leg weakness and wondering if she should have imaging done. Hubbard Hartshorn, RN, BSN

## 2017-03-04 NOTE — Telephone Encounter (Signed)
If she has new symptoms of leg weakness she should come in to be evaluated by a provider.  If it is after hours, I would advise her to go to the ED or an urgent care.

## 2017-03-30 ENCOUNTER — Emergency Department (HOSPITAL_COMMUNITY): Payer: BLUE CROSS/BLUE SHIELD

## 2017-03-30 ENCOUNTER — Other Ambulatory Visit: Payer: Self-pay

## 2017-03-30 ENCOUNTER — Emergency Department (HOSPITAL_COMMUNITY)
Admission: EM | Admit: 2017-03-30 | Discharge: 2017-03-30 | Disposition: A | Discharge status: Home / Self Care | Payer: BLUE CROSS/BLUE SHIELD | Attending: Emergency Medicine | Admitting: Emergency Medicine

## 2017-03-30 ENCOUNTER — Encounter (HOSPITAL_COMMUNITY): Payer: Self-pay

## 2017-03-30 DIAGNOSIS — Z79899 Other long term (current) drug therapy: Secondary | ICD-10-CM | POA: Insufficient documentation

## 2017-03-30 DIAGNOSIS — F17210 Nicotine dependence, cigarettes, uncomplicated: Secondary | ICD-10-CM | POA: Diagnosis not present

## 2017-03-30 DIAGNOSIS — M549 Dorsalgia, unspecified: Secondary | ICD-10-CM | POA: Diagnosis present

## 2017-03-30 DIAGNOSIS — M5431 Sciatica, right side: Secondary | ICD-10-CM | POA: Insufficient documentation

## 2017-03-30 LAB — COMPREHENSIVE METABOLIC PANEL
ALK PHOS: 117 U/L (ref 38–126)
ALT: 12 U/L — AB (ref 14–54)
AST: 17 U/L (ref 15–41)
Albumin: 3.3 g/dL — ABNORMAL LOW (ref 3.5–5.0)
Anion gap: 8 (ref 5–15)
BUN: 10 mg/dL (ref 6–20)
CALCIUM: 9 mg/dL (ref 8.9–10.3)
CHLORIDE: 103 mmol/L (ref 101–111)
CO2: 24 mmol/L (ref 22–32)
CREATININE: 0.72 mg/dL (ref 0.44–1.00)
GFR calc non Af Amer: >60 mL/min (ref >60)
GLUCOSE: 101 mg/dL — AB (ref 65–99)
Potassium: 3.9 mmol/L (ref 3.5–5.1)
SODIUM: 135 mmol/L (ref 135–145)
Total Bilirubin: 0.5 mg/dL (ref 0.3–1.2)
Total Protein: 7 g/dL (ref 6.5–8.1)

## 2017-03-30 LAB — CBC WITH DIFFERENTIAL/PLATELET
BASOS ABS: 0 10*3/uL (ref 0.0–0.1)
Basophils Relative: 0 %
EOS ABS: 0.3 10*3/uL (ref 0.0–0.7)
Eosinophils Relative: 2 %
HCT: 36.5 % (ref 36.0–46.0)
HEMOGLOBIN: 12.1 g/dL (ref 12.0–15.0)
LYMPHS ABS: 3.2 10*3/uL (ref 0.7–4.0)
LYMPHS PCT: 22 %
MCH: 30.6 pg (ref 26.0–34.0)
MCHC: 33.2 g/dL (ref 30.0–36.0)
MCV: 92.4 fL (ref 78.0–100.0)
Monocytes Absolute: 1.2 10*3/uL — ABNORMAL HIGH (ref 0.1–1.0)
Monocytes Relative: 8 %
NEUTROS PCT: 68 %
Neutro Abs: 9.5 10*3/uL — ABNORMAL HIGH (ref 1.7–7.7)
PLATELETS: 411 10*3/uL — AB (ref 150–400)
RBC: 3.95 MIL/uL (ref 3.87–5.11)
RDW: 12.3 % (ref 11.5–15.5)
WBC: 14.1 10*3/uL — AB (ref 4.0–10.5)

## 2017-03-30 LAB — URINALYSIS, ROUTINE W REFLEX MICROSCOPIC
BILIRUBIN URINE: NEGATIVE
Glucose, UA: NEGATIVE mg/dL
Hgb urine dipstick: NEGATIVE
Ketones, ur: NEGATIVE mg/dL
Leukocytes, UA: NEGATIVE
NITRITE: NEGATIVE
PROTEIN: NEGATIVE mg/dL
SPECIFIC GRAVITY, URINE: 1.008 (ref 1.005–1.030)
pH: 6 (ref 5.0–8.0)

## 2017-03-30 LAB — LIPASE, BLOOD: Lipase: 22 U/L (ref 11–51)

## 2017-03-30 MED ORDER — DEXAMETHASONE SODIUM PHOSPHATE 10 MG/ML IJ SOLN
10.0000 mg | Freq: Once | INTRAMUSCULAR | Status: AC
  Administered 2017-03-30: 10 mg via INTRAVENOUS
  Filled 2017-03-30: qty 1

## 2017-03-30 MED ORDER — ONDANSETRON 4 MG PO TBDP: 4.0000 mg | ORAL_TABLET | Freq: Three times a day (TID) | ORAL | 0 refills | Status: DC | PRN

## 2017-03-30 MED ORDER — METHOCARBAMOL 1000 MG/10ML IJ SOLN: 500.0000 mg | Freq: Once | INTRAMUSCULAR | Status: DC

## 2017-03-30 MED ORDER — HYDROMORPHONE HCL 1 MG/ML IJ SOLN: 1.0000 mg | Freq: Once | INTRAMUSCULAR | Status: DC

## 2017-03-30 MED ORDER — DOCUSATE SODIUM 250 MG PO CAPS: 250.0000 mg | ORAL_CAPSULE | Freq: Every day | ORAL | 0 refills | Status: DC

## 2017-03-30 MED ORDER — SODIUM CHLORIDE 0.9 % IV BOLUS (SEPSIS)
1000.0000 mL | Freq: Once | INTRAVENOUS | Status: AC
  Administered 2017-03-30: 1000 mL via INTRAVENOUS

## 2017-03-30 MED ORDER — OXYCODONE-ACETAMINOPHEN 5-325 MG PO TABS: 1.0000 | ORAL_TABLET | ORAL | 0 refills | Status: DC | PRN

## 2017-03-30 MED ORDER — METHYLPREDNISOLONE 4 MG PO TBPK: ORAL_TABLET | ORAL | 0 refills | Status: DC

## 2017-03-30 MED ORDER — OXYCODONE-ACETAMINOPHEN 5-325 MG PO TABS
2.0000 | ORAL_TABLET | Freq: Once | ORAL | Status: AC
  Administered 2017-03-30: 2 via ORAL
  Filled 2017-03-30: qty 2

## 2017-03-30 MED ORDER — MORPHINE SULFATE (PF) 4 MG/ML IV SOLN
6.0000 mg | Freq: Once | INTRAVENOUS | Status: AC
  Administered 2017-03-30: 6 mg via INTRAVENOUS
  Filled 2017-03-30: qty 2

## 2017-03-30 MED ORDER — KETOROLAC TROMETHAMINE 15 MG/ML IJ SOLN
15.0000 mg | Freq: Once | INTRAMUSCULAR | Status: AC
  Administered 2017-03-30: 15 mg via INTRAVENOUS
  Filled 2017-03-30: qty 1

## 2017-03-30 MED ORDER — METHOCARBAMOL 1000 MG/10ML IJ SOLN
500.0000 mg | Freq: Once | INTRAVENOUS | Status: AC
  Administered 2017-03-30: 500 mg via INTRAVENOUS
  Filled 2017-03-30: qty 5

## 2017-03-30 MED FILL — ONDANSETRON ODT 4 MG TABLET: 4 | 6 days supply | Qty: 20 | Fill #0

## 2017-03-30 MED FILL — METHYLPREDNISOLONE 4 MG TAB: 4 | 5 days supply | Qty: 21 | Fill #0

## 2017-03-30 MED FILL — OXYCOD/ACETAMINOPHEN 5-325M: 5-325 | 3 days supply | Qty: 20 | Fill #0

## 2017-03-30 NOTE — ED Triage Notes (Signed)
Pt states back pain for several months. She states having to use ibuprofen frequently. She was told it was her sciatic nerve and she states the pain has gotten excruciating over the last 2 days. Pain radiates down her right leg and some weakness in the RLE. Pt denies loss of bowel or bladder.

## 2017-03-30 NOTE — ED Notes (Signed)
Pt ambulated well, no assistance needed.

## 2017-03-30 NOTE — ED Notes (Signed)
Advised pt that u/a is needed.

## 2017-03-30 NOTE — ED Notes (Signed)
Patient transported to X-ray 

## 2017-03-30 NOTE — Progress Notes (Addendum)
Subjective:    Patient ID: [name], female    DOB: [birth year], 61 y.o.   MRN: [id number]  HPI:  [name] is here to establish as a new pt.  She is a pleasant 61 year old female.  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  Resumed heavy drinking at age 48 (2-3 bottles/day) and quit again 2014 after a suicide attempt 2014.   CBT 2014-2015 Started using tobacco again 2014, now smoking pack/day Nov 2017- severe abdominal pain and ultimately dx'd with chronic pancreatis with calcification.   April 2018, July 20180- underwent lithotripsy at [facility]/GI specialist, next f/u Feb 2019 Chronic pancreatic pain that has caused her to seek pain control at previous PCP and multiple UCs/EDs. She reports being "on waiting list for [facility] for > 1 year". She denies regular ETOH [place], however did admit to drinking "a few drinks the other day b/c my back hurt so much". Seen in ED 03/30/17, re: lumbar back pain with sciatica.  Provided narcotics and referral to [facility] for  Sciatica, she is awaiting call to make initial appt. She denies current sx's of depression or thoughts of harming herself or others.  She reports a strong support system of her boyfriend, friends, and her son is in [place].  Patient Care Team    Relationship Specialty Notifications Start End  Lovenia Kim, MD PCP - General  All results, Admissions 10/18/16   Michael Boston, MD Consulting Physician General Surgery  05/25/16     Patient Active Problem List   Diagnosis Date Noted  . Pancreatic pain 03/31/2017  . Sciatica of right side 02/25/2017  . Tobacco use disorder 02/25/2017  . Incisional hernia s/p lap/open repair with mesh 07/09/2016 07/09/2016  . Chronic pancreatitis ([employer]) 04/18/2016  . Pancreatic calcification 04/03/2016  . Abdominal pain, right upper quadrant 04/03/2016   . Healthcare maintenance 04/03/2016  . Major depressive disorder, recurrent, severe without psychotic features ([place])   . Substance induced mood disorder ([place]) 11/06/2014  . Bilateral knee pain 10/14/2014  . Alcohol use disorder, severe, dependence ([place]) 08/17/2014  . Suicidal behavior   . Alcohol dependence ([place]) 10/02/2013  . Major depression, recurrent ([place]) 10/02/2013  . Gastric ulcer 01/20/2012  . Fatty liver 10/11/2011  . 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 Elevated liver function tests   . GERD (gastroesophageal reflux disease)   . ESOPHAGEAL MOTILITY DISORDER 10/01/2008  . HIATAL HERNIA 10/01/2008  . FATTY LIVER DISEASE 10/01/2008     Past Medical History:  Diagnosis Date  . Alcohol abuse    H/o withdrawal   . Alcohol withdrawal ([place] [address]) 10/02/2013  . Anxiety   . Chronic diarrhea    hx of  . Constipation   . Depression   . Elevated liver function tests few yrs ago  . Pancreatitis   . PONV (postoperative nausea and vomiting)   . Rosacea   . Wears glasses      Past Surgical History:  Procedure Laterality Date  . CHOLECYSTECTOMY    . COLONOSCOPY  Never  . DIAGNOSTIC MAMMOGRAM  2008  . ERCP  10/06/2011   Procedure: ENDOSCOPIC RETROGRADE CHOLANGIOPANCREATOGRAPHY (ERCP);  Surgeon: Beryle Beams, MD;  Location: [facility];  Service: Endoscopy;  Laterality: N/A;  . ESOPHAGOGASTRODUODENOSCOPY  03/21/2011   Procedure: ESOPHAGOGASTRODUODENOSCOPY (EGD);  Surgeon: Scarlette Shorts, MD;  Location: [facility] ENDOSCOPY;  Service: Endoscopy;  Laterality: N/A;  Patient  may need to be done at bedside tomorrow depending on status  . ESOPHAGOGASTRODUODENOSCOPY  01/06/2012   Procedure: ESOPHAGOGASTRODUODENOSCOPY (EGD);  Surgeon: Wonda Horner, MD;  Location: [facility] END[facility];  Service: Endoscopy;  Laterality: N/A;  bedside  . HERNIA REPAIR    . INSERTION OF MESH  07/09/2016   Procedure: INSERTION OF MESH;  Surgeon: Michael Boston, MD;  Location: [facility] OR[facility];  Service: General;;  . LAPAROTOMY  01/03/2012    Procedure: EXPLORATORY LAPAROTOMY;  Surgeon: Pedro Earls, MD;  Location: [facility];  Service: General;  Laterality: N/A;  debridement of necrotic pancreas and placement of drains x2  . pancreatic cyst removed   2015  . SHOULDER SURGERY Left 02/18/2010   clavical repair  . TUBAL LIGATION    . VENTRAL HERNIA REPAIR N/A 07/09/2016   Procedure: LAPAROSCOPIC LYSIS OF ADHESIONS  VENTRAL WALL HERNIA REPAIR WITH MESH EXCISION OF HERNIA [place];  Surgeon: Michael Boston, MD;  Location: [facility] OR[facility];  Service: General;  Laterality: N/A;     Family History  Problem Relation Age of Onset  . Stroke Mother   . Heart disease Father   . Heart failure Father   . Heart disease Sister      Social History   Substance and Sexual Activity  Drug Use Yes  . Types: Marijuana     Social History   Substance and Sexual Activity  Alcohol Use No  . Alcohol/week: 1.5 - 2.0 oz  . Types: 3 - 4 Standard drinks or equivalent per week   Comment: quit 2016     Social History   Tobacco Use  Smoking Status Light Tobacco Smoker  . Packs/day: 0.25  . Years: 15.00  . Pack years: 3.75  . Types: Cigarettes  Smokeless Tobacco Never Used  Tobacco Comment   trying nicotine patch     Outpatient Encounter Medications as of 03/31/2017  Medication Sig  . Calcium Carbonate-Vitamin D (CALCIUM 600+D PO) Take 1 tablet by mouth daily.  Marland Kitchen docusate sodium (COLACE) 250 MG capsule Take 1 capsule (250 mg total) by mouth daily. While taking pain medication  . HYDROcodone-acetaminophen (NORCO/VICODIN) 5-325 MG tablet Take 1 tablet by mouth every 6 (six) hours as needed.  . methylPREDNISolone (MEDROL DOSEPAK) 4 MG TBPK tablet Take as directed on package  . Multiple Vitamin (MULTIVITAMIN WITH MINERALS) TABS tablet Take 1 tablet by mouth daily.  . ondansetron (ZOFRAN ODT) 4 MG disintegrating tablet Take 1 tablet (4 mg total) by mouth every 8 (eight) hours as needed for nausea or vomiting.  Marland Kitchen oxyCODONE-acetaminophen (PERCOCET/ROXICET)  5-325 MG tablet Take 1-2 tablets by mouth every 4 (four) hours as needed for moderate pain or severe pain.  . valACYclovir (VALTREX) 1000 MG tablet Take 1,000 mg by mouth daily as needed (fever blisters).   . nicotine (NICODERM CQ) 14 mg/24hr patch Place 1 patch (14 mg total) onto the skin daily.  . [DISCONTINUED] amitriptyline (ELAVIL) 25 MG tablet 1 tablet in the morning. Take 2 tablets at night. (Patient taking differently: Take 25 mg by mouth 2 (two) times daily. )  . [DISCONTINUED] famotidine (PEPCID) 20 MG tablet Take 1 tablet (20 mg total) by mouth 2 (two) times daily.  . [DISCONTINUED] gabapentin (NEURONTIN) 100 MG capsule Take 1 capsule (100 mg total) 3 (three) times daily by mouth.  . [DISCONTINUED] methocarbamol (ROBAXIN) 500 MG tablet Take 1-2 tablets (500-1,000 mg total) by mouth every 6 (six) hours as needed for muscle spasms. (Patient not taking: Reported on 03/30/2017)  . [DISCONTINUED]  ondansetron (ZOFRAN ODT) 4 MG disintegrating tablet Take 1 tablet (4 mg total) by mouth every 8 (eight) hours as needed for nausea or vomiting. (Patient not taking: Reported on 03/30/2017)  . [DISCONTINUED] varenicline (CHANTIX STARTING MONTH PAK) 0.5 MG X 11 & 1 MG X 42 tablet Take one 0.5 mg tablet by mouth once daily for 3 days, then increase to one 0.5 mg tablet twice daily for 4 days, then increase to one 1 mg tablet twice daily. (Patient not taking: Reported on 03/30/2017)   No facility-administered encounter medications on file as of 03/31/2017.     Allergies: Ciprofloxacin  Body mass index is 25.34 kg/m.  Blood pressure 122/81, pulse 82, weight 157 lb (71.2 kg), last menstrual period 12/08/2006.    Review of Systems  Constitutional: Positive for fatigue. Negative for activity change, appetite change, chills, diaphoresis, fever and unexpected weight change.  HENT: Negative for congestion.   Eyes: Negative for visual disturbance.  Respiratory: Positive for cough. Negative for chest  tightness, shortness of breath, wheezing and stridor.   Cardiovascular: Negative for chest pain, palpitations and leg swelling.  Gastrointestinal: Positive for abdominal pain and nausea. Negative for abdominal distention, blood in stool, constipation, diarrhea and vomiting.  Endocrine: Negative for cold intolerance, heat intolerance, polydipsia, polyphagia and polyuria.  Genitourinary: Negative for difficulty urinating, flank pain and hematuria.  Musculoskeletal: Positive for arthralgias, back pain and myalgias. Negative for gait problem, joint swelling, neck pain and neck stiffness.  Skin: Negative for color change, pallor, rash and wound.  Neurological: Negative for dizziness, syncope, weakness and headaches.  Hematological: Does not bruise/bleed easily.  Psychiatric/Behavioral: Positive for sleep disturbance. Negative for decreased concentration, dysphoric mood, hallucinations, self-injury and suicidal ideas. The patient is not nervous/anxious and is not hyperactive.        Objective:   Physical Exam  Constitutional: She is oriented to person, place, and time. She appears well-developed and well-nourished. No distress.  HENT:  Head: Normocephalic and atraumatic.  Right Ear: External ear normal.  Left Ear: External ear normal.  Cardiovascular: Normal rate, regular rhythm, normal heart sounds and intact distal pulses.  No murmur heard. Pulmonary/Chest: Effort normal and breath sounds normal. No respiratory distress. She has no wheezes. She has no rales. She exhibits no tenderness.  Neurological: She is alert and oriented to person, place, and time.  Skin: Skin is warm and dry. No rash noted. She is not diaphoretic. No erythema. No pallor.  Ruddy facial appearance   Psychiatric: She has a normal mood and affect. Her behavior is normal. Judgment and thought content normal.  Nursing note and vitals reviewed.         Assessment & Plan:   1. Chronic pancreatitis, unspecified  pancreatitis type ([place])   2. Pseudocyst of pancreas   3. Alcohol dependence with unspecified alcohol-induced disorder ([place])   4. Healthcare maintenance   5. Pancreatic pain   6. Tobacco use disorder     Alcohol dependence ([place]) Advised to completley abstain from ETOH-discussed at length. She verbalized understanding/agreement.   Healthcare maintenance Please use Nicoderm patches as directed. Increase water intake, strive for at least 75 ounces/day.   Follow Heart Healthy diet Increase regular exercise.  Recommend at least 30 minutes daily, 5 days per week of walking, jogging, biking, swimming, YouTube/Pinterest workout videos. Stretch daily. Please continue with [facility]/GI specialist as directed. Refrain from any alcohol use. Please schedule full physical with fasting labs in the next few weeks.  Pancreatic pain Pain clinic referral placed. Continue  with [facility]/GI specialist as directed. AVOID ETOH  Tobacco use disorder Use Nicoderm patches as directed  >45 mins spent verifying history and coordinating care.   FOLLOW-UP:  Return in about 4 weeks (around 04/28/2017) for CPE, Fasting Lab Draw.

## 2017-03-30 NOTE — ED Provider Notes (Addendum)
[facility]ERGENC[facility] DEPARTMENT Provider Note   CSN: [id number] Arrival date & time: 03/30/17  0909     History   Chief Complaint Chief Complaint  Patient presents with  . Back Pain    HPI [name] is a 61 y.o. female.  HPI  61 yo F with PMHx as below here with severe back pain. Pt reports that her sx began 3 months ago. They began gradually. She's had progressively worsening aching, throbbing, severe R buttocks pain that radiates and shoots down her leg. It goes down the back of her leg to her foot. Her legs occasionally give out from this pain. Denies any loss of bowel or bladder continence. Denies any fever, chills, night sweats, or weight loss. No persistent numbness. Pain is worse with flexion of the leg, certain positions. No alleviating factors.  Past Medical History:  Diagnosis Date  . Alcohol abuse    H/o withdrawal   . Alcohol withdrawal ([place]) 10/02/2013  . Anxiety   . Chronic diarrhea    hx of  . Constipation   . Depression   . Elevated liver function tests few yrs ago  . Pancreatitis   . PONV (postoperative nausea and vomiting)   . Rosacea   . Wears glasses     Patient Active Problem List   Diagnosis Date Noted  . Sciatica of right side 02/25/2017  . Tobacco abuse 02/25/2017  . Incisional hernia s/p lap/open repair with mesh 07/09/2016 07/09/2016  . Chronic pancreatitis ([place]) 04/18/2016  . Pancreatic calcification 04/03/2016  . Abdominal pain, right upper quadrant 04/03/2016  . Major depressive disorder, recurrent, severe without psychotic features ([place])   . Substance induced mood disorder ([place]) 11/06/2014  . Bilateral knee pain 10/14/2014  . Alcohol use disorder, severe, dependence ([place]) 08/17/2014  . Suicidal behavior   . Alcohol dependence ([place]) 10/02/2013  . Major depression, recurrent ([place]) 10/02/2013  . Gastric ulcer 01/20/2012  . Fatty liver 10/11/2011  . 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 Elevated liver function  tests   . GERD (gastroesophageal reflux disease)   . ESOPHAGEAL MOTILITY DISORDER 10/01/2008  . HIATAL HERNIA 10/01/2008  . FATTY LIVER DISEASE 10/01/2008    Past Surgical History:  Procedure Laterality Date  . CHOLECYSTECTOMY    . COLONOSCOPY  Never  . DIAGNOSTIC MAMMOGRAM  2008  . ERCP  10/06/2011   Procedure: ENDOSCOPIC RETROGRADE CHOLANGIOPANCREATOGRAPHY (ERCP);  Surgeon: Beryle Beams, MD;  Location: [facility];  Service: Endoscopy;  Laterality: N/A;  . ESOPHAGOGASTRODUODENOSCOPY  03/21/2011   Procedure: ESOPHAGOGASTRODUODENOSCOPY (EGD);  Surgeon: Scarlette Shorts, MD;  Location[facility] END[facility]COPY;  Service: Endoscopy;  Laterality: N/A;  Patient may need to be done at bedside tomorrow depending on status  . ESOPHAGOGASTRODUODENOSCOPY  01/06/2012   Procedure: ESOPHAGOGASTRODUODENOSCOPY (EGD);  Surgeon: Wonda Horner, MD;  Location: [facility];  Service: Endoscopy;  Laterality: N/A;  bedside  . INSERTION OF MESH  07/09/2016   Procedure: INSERTION OF MESH;  Surgeon: Michael Boston, MD;  Location: [facility] ORS;  Service: General;;  . LAPAROTOMY  01/03/2012   Procedure: EXPLORATORY LAPAROTOMY;  Surgeon: Pedro Earls, MD;  Location: [facility] OR[facility];  Service: General;  Laterality: N/A;  debridement of necrotic pancreas and placement of drains x2  . pancreatic cyst removed   2015  . SHOULDER SURGERY Left 02/18/2010   clavical repair  . TUBAL LIGATION    . VENTRAL HERNIA REPAIR N/A 07/09/2016   Procedure: LAPAROSCOPIC LYSIS OF ADHESIONS  VENTRAL WALL HERNIA REPAIR WITH MESH  EXCISION OF HERNIA [place];  Surgeon: Michael Boston, MD;  Location: [facility];  Service: General;  Laterality: N/A;    OB History    No data available       Home Medications    Prior to Admission medications   Medication Sig Start Date End Date Taking? Authorizing Provider  Calcium Carbonate-Vitamin D (CALCIUM 600+D PO) Take 1 tablet by mouth daily.   Yes [provider]  famotidine (PEPCID) 20 MG tablet Take 1 tablet (20 mg total)  by mouth 2 (two) times daily. 12/25/16  Yes Virgel Manifold, MD  gabapentin (NEURONTIN) 100 MG capsule Take 1 capsule (100 mg total) 3 (three) times daily by mouth. 02/25/17  Yes Verner Mould, MD  Multiple Vitamin (MULTIVITAMIN WITH MINERALS) TABS tablet Take 1 tablet by mouth daily.   Yes [provider]  valACYclovir (VALTREX) 1000 MG tablet Take 1,000 mg by mouth daily as needed (fever blisters).  04/10/15  Yes [provider]  amitriptyline (ELAVIL) 25 MG tablet 1 tablet in the morning. Take 2 tablets at night. Patient taking differently: Take 25 mg by mouth 2 (two) times daily.  06/19/16   McKeag, Marylynn Pearson, MD  docusate sodium (COLACE) 250 MG capsule Take 1 capsule (250 mg total) by mouth daily. While taking pain medication 03/30/17   Duffy Bruce, MD  HYDROcodone-acetaminophen (NORCO/VICODIN) 5-325 MG tablet Take 1 tablet by mouth every 6 (six) hours as needed. Patient not taking: Reported on 03/30/2017 12/25/16   Virgel Manifold, MD  methocarbamol (ROBAXIN) 500 MG tablet Take 1-2 tablets (500-1,000 mg total) by mouth every 6 (six) hours as needed for muscle spasms. Patient not taking: Reported on 03/30/2017 07/09/16   Michael Boston, MD  methylPREDNISolone (MEDROL DOSEPAK) 4 MG TBPK tablet Take as directed on package 03/30/17   Duffy Bruce, MD  ondansetron (ZOFRAN ODT) 4 MG disintegrating tablet Take 1 tablet (4 mg total) by mouth every 8 (eight) hours as needed for nausea or vomiting. 03/30/17   Duffy Bruce, MD  oxyCODONE-acetaminophen (PERCOCET/ROXICET) 5-325 MG tablet Take 1-2 tablets by mouth every 4 (four) hours as needed for moderate pain or severe pain. 03/30/17   Duffy Bruce, MD  varenicline (CHANTIX STARTING MONTH PAK) 0.5 MG X 11 & 1 MG X 42 tablet Take one 0.5 mg tablet by mouth once daily for 3 days, then increase to one 0.5 mg tablet twice daily for 4 days, then increase to one 1 mg tablet twice daily. Patient not taking: Reported on 03/30/2017  02/25/17   Verner Mould, MD    Family History Family History  Problem Relation Age of Onset  . Stroke Mother   . Heart disease Father   . Heart failure Father   . Heart disease Sister     Social History Social History   Tobacco Use  . Smoking status: Light Tobacco Smoker    Packs/day: 0.25    Years: 15.00    Pack years: 3.75    Types: Cigarettes  . Smokeless tobacco: Never Used  . Tobacco comment: trying nicotine patch  Substance Use Topics  . Alcohol use: No    Alcohol/week: 1.5 - 2.0 oz    Types: 3 - 4 Standard drinks or equivalent per week    Comment: quit 2016  . Drug use: No     Allergies   Ciprofloxacin   Review of Systems Review of Systems  Constitutional: Negative for chills and fever.  HENT: Negative for congestion, rhinorrhea and sore throat.  Eyes: Negative for visual disturbance.  Respiratory: Negative for cough, shortness of breath and wheezing.   Cardiovascular: Negative for chest pain and leg swelling.  Gastrointestinal: Negative for abdominal pain, diarrhea, nausea and vomiting.  Genitourinary: Negative for dysuria, flank pain, vaginal bleeding and vaginal discharge.  Musculoskeletal: Positive for arthralgias and back pain. Negative for neck pain.  Skin: Negative for rash.  Allergic/Immunologic: Negative for immunocompromised state.  Neurological: Positive for weakness and numbness. Negative for syncope and headaches.  Hematological: Does not bruise/bleed easily.  All other systems reviewed and are negative.    Physical Exam Updated Vital Signs BP (!) 146/76   Pulse 70   Temp 98.4 F (36.9 C) (Oral)   Resp 18   LMP 12/08/2006   SpO2 96%   Physical Exam  Constitutional: She is oriented to person, place, and time. She appears well-developed and well-nourished. No distress.  HENT:  Head: Normocephalic and atraumatic.  Eyes: Conjunctivae are normal.  Neck: Neck supple.  Cardiovascular: Normal rate, regular rhythm and  normal heart sounds. Exam reveals no friction rub.  No murmur heard. Pulmonary/Chest: Effort normal and breath sounds normal. No respiratory distress. She has no wheezes. She has no rales.  Abdominal: She exhibits no distension.  Musculoskeletal: She exhibits no edema.  Neurological: She is alert and oriented to person, place, and time. She exhibits normal muscle tone.  Skin: Skin is warm. Capillary refill takes less than 2 seconds.  Psychiatric: She has a normal mood and affect.  Nursing note and vitals reviewed.   Spine Exam: Inspection/Palpation: Significant TTP over right paraspinal space of lower lumbosacral spine. No midline TTP or deformity. Strength: 5/5 throughout LE bilaterally (hip flexion/extension, adduction/abduction; knee flexion/extension; foot dorsiflexion/plantarflexion, inversion/eversion; great toe inversion) Sensation: Intact to light touch in proximal and distal LE bilaterally Reflexes: 2+ quadriceps and achilles reflexes   ED Treatments / Results  Labs (all labs ordered are listed, but only abnormal results are displayed) Labs Reviewed  CBC WITH DIFFERENTIAL/PLATELET - Abnormal; Notable for the following components:      Result Value   WBC 14.1 (*)    Platelets 411 (*)    Neutro Abs 9.5 (*)    Monocytes Absolute 1.2 (*)    All other components within normal limits  COMPREHENSIVE METABOLIC PANEL - Abnormal; Notable for the following components:   Glucose, Bld 101 (*)    Albumin 3.3 (*)    ALT 12 (*)    All other components within normal limits  URINALYSIS, ROUTINE W REFLEX MICROSCOPIC  LIPASE, BLOOD    EKG  EKG Interpretation None       Radiology Dg Lumbar Spine Complete  Result Date: 03/30/2017 CLINICAL DATA:  Low back pain EXAM: LUMBAR SPINE - COMPLETE 4+ VIEW COMPARISON:  CT 03/25/2016 FINDINGS: Degenerative disc and facet disease in the lower lumbar spine with disc space narrowing and spurring. Normal alignment. No fracture. Atherosclerotic  change in the scratched at atherosclerotic calcifications in the aorta. No visible aneurysm. Calcifications in the pancreatic head region as seen on prior CT. Prior cholecystectomy. Plastic biliary stent noted in place. IMPRESSION: Degenerative disc and facet disease in the lower lumbar spine. No acute bony abnormality. Electronically Signed   By: Rolm Baptise M.D.   On: 03/30/2017 11:29    Procedures Procedures (including critical care time)  Medications Ordered in ED Medications  oxyCODONE-acetaminophen (PERCOCET/ROXICET) 5-325 MG per tablet 2 tablet (not administered)  sodium chloride 0.9 % bolus 1,000 mL (1,000 mLs Intravenous New Bag/Given 03/30/17 1035)  morphine 4 MG/ML injection 6 mg (6 mg Intravenous Given 03/30/17 1035)  methocarbamol (ROBAXIN) 500 mg in dextrose 5 % 50 mL IVPB (500 mg Intravenous New Bag/Given 03/30/17 1125)  dexamethasone (DECADRON) injection 10 mg (10 mg Intravenous Given 03/30/17 1126)  ketorolac (TORADOL) 15 MG/ML injection 15 mg (15 mg Intravenous Given 03/30/17 1126)     Initial Impression / Assessment and Plan / ED Course  I have reviewed the triage vital signs and the nursing notes.  Pertinent labs & imaging results that were available during my care of the patient were reviewed by me and considered in my medical decision making (see chart for details).     61 year old female with past medical history as above here with back pain radiating down the posterior aspect of her right leg.  Pain and history is highly consistent with acute radicular pain, likely secondary to sciatica.  I suspect she may have a herniated disc.  Her symptoms improve when she is in Trendelenburg and worsen with leg raise.  She has no contralateral weakness or numbness, no persistent weakness or numbness between the episodes of pain, no loss of bowel or bladder function, or other symptoms to suggest acute, severe cord compression or nerve injury.  She has no fever, chills, weight loss,  night sweats, and plain film is negative.  I have a low suspicion for infectious process or malignancy.  She is able to ambulate after pain control.  Will DC with analgesia, steroids, and outpatient spine follow-up.  I also discussed strict return precautions including any signs of persistent weakness, numbness, loss of bowel bladder function, or infectious symptoms.  Final Clinical Impressions(s) / ED Diagnoses   Final diagnoses:  Sciatica, right side    ED Discharge Orders        Ordered    oxyCODONE-acetaminophen (PERCOCET/ROXICET) 5-325 MG tablet  Every 4 hours PRN     03/30/17 1232    ondansetron (ZOFRAN ODT) 4 MG disintegrating tablet  Every 8 hours PRN     03/30/17 1232    docusate sodium (COLACE) 250 MG capsule  Daily     03/30/17 1232    methylPREDNISolone (MEDROL DOSEPAK) 4 MG TBPK tablet     03/30/17 1232      Clinical Impression: 1. Sciatica, right side     Disposition: Discharge  Condition: Good  I have discussed the results, Dx and Tx plan with the pt(& family if present). He/she/they expressed understanding and agree(s) with the plan. Discharge instructions discussed at great length. Strict return precautions discussed and pt &/or family have verbalized understanding of the instructions. No further questions at time of discharge.    This SmartLink is deprecated. Use AVSMEDLIST instead to display the medication list for a patient.  Follow Up: Traci Sermon, PA-C [place] [address] [phone]   Follow-up with [facility] for follow-up of your sciatica  [place] [address] [id number] [place] [phone]  As needed, If symptoms worsen  [place], [place] [address] [place] [address] [place] [address] [phone]        Duffy Bruce, MD 03/30/17 1238    Duffy Bruce, MD 03/30/17  1239

## 2017-03-31 ENCOUNTER — Ambulatory Visit (INDEPENDENT_AMBULATORY_CARE_PROVIDER_SITE_OTHER): Payer: BLUE CROSS/BLUE SHIELD | Admitting: Adult Health

## 2017-03-31 ENCOUNTER — Other Ambulatory Visit: Payer: Self-pay | Admitting: Adult Health

## 2017-03-31 ENCOUNTER — Encounter: Payer: Self-pay | Admitting: Adult Health

## 2017-03-31 VITALS — BP 122/81 | HR 82 | Wt 157.0 lb

## 2017-03-31 DIAGNOSIS — K863 Pseudocyst of pancreas: Secondary | ICD-10-CM | POA: Diagnosis not present

## 2017-03-31 DIAGNOSIS — F1029 Alcohol dependence with unspecified alcohol-induced disorder: Secondary | ICD-10-CM

## 2017-03-31 DIAGNOSIS — K8689 Other specified diseases of pancreas: Secondary | ICD-10-CM | POA: Diagnosis not present

## 2017-03-31 DIAGNOSIS — F17200 Nicotine dependence, unspecified, uncomplicated: Secondary | ICD-10-CM | POA: Diagnosis not present

## 2017-03-31 DIAGNOSIS — Z0000 Encounter for general adult medical examination without abnormal findings: Secondary | ICD-10-CM | POA: Diagnosis not present

## 2017-03-31 DIAGNOSIS — K861 Other chronic pancreatitis: Secondary | ICD-10-CM

## 2017-03-31 HISTORY — DX: Other specified diseases of pancreas: K86.89

## 2017-03-31 MED ORDER — NICOTINE 14 MG/24HR TD PT24: 14.0000 mg | MEDICATED_PATCH | Freq: Every day | TRANSDERMAL | 0 refills | Status: DC

## 2017-03-31 NOTE — Assessment & Plan Note (Signed)
Use Nicoderm patches as directed

## 2017-03-31 NOTE — Patient Instructions (Addendum)
Heart-Healthy Eating Plan Many factors influence your heart health, including eating and exercise habits. Heart (coronary) risk increases with abnormal blood fat (lipid) levels. Heart-healthy meal planning includes limiting unhealthy fats, increasing healthy fats, and making other small dietary changes. This includes maintaining a healthy body weight to help keep lipid levels within a normal range. What is my plan? Your health care provider recommends that you:  Get no more than ___25___% of the total calories in your daily diet from fat.  Limit your intake of saturated fat to less than __5___% of your total calories each day.  Limit the amount of cholesterol in your diet to less than __300___ mg per day.  What types of fat should I choose?  Choose healthy fats more often. Choose monounsaturated and polyunsaturated fats, such as olive oil and canola oil, flaxseeds, walnuts, almonds, and seeds.  Eat more omega-3 fats. Good choices include salmon, mackerel, sardines, tuna, flaxseed oil, and ground flaxseeds. Aim to eat fish at least two times each week.  Limit saturated fats. Saturated fats are primarily found in animal products, such as meats, butter, and cream. Plant sources of saturated fats include palm oil, palm kernel oil, and coconut oil.  Avoid foods with partially hydrogenated oils in them. These contain trans fats. Examples of foods that contain trans fats are stick margarine, some tub margarines, cookies, crackers, and other baked goods. What general guidelines do I need to follow?  Check food labels carefully to identify foods with trans fats or high amounts of saturated fat.  Fill one half of your plate with vegetables and green salads. Eat 4-5 servings of vegetables per day. A serving of vegetables equals 1 cup of raw leafy vegetables,  cup of raw or cooked cut-up vegetables, or  cup of vegetable juice.  Fill one fourth of your plate with whole grains. Look for the word  "whole" as the first word in the ingredient list.  Fill one fourth of your plate with lean protein foods.  Eat 4-5 servings of fruit per day. A serving of fruit equals one medium whole fruit,  cup of dried fruit,  cup of fresh, frozen, or canned fruit, or  cup of 100% fruit juice.  Eat more foods that contain soluble fiber. Examples of foods that contain this type of fiber are apples, broccoli, carrots, beans, peas, and barley. Aim to get 20-30 g of fiber per day.  Eat more home-cooked food and less restaurant, buffet, and fast food.  Limit or avoid alcohol.  Limit foods that are high in starch and sugar.  Avoid fried foods.  Cook foods by using methods other than frying. Baking, boiling, grilling, and broiling are all great options. Other fat-reducing suggestions include: ? Removing the skin from poultry. ? Removing all visible fats from meats. ? Skimming the fat off of stews, soups, and gravies before serving them. ? Steaming vegetables in water or broth.  Lose weight if you are overweight. Losing just 5-10% of your initial body weight can help your overall health and prevent diseases such as diabetes and heart disease.  Increase your consumption of nuts, legumes, and seeds to 4-5 servings per week. One serving of dried beans or legumes equals  cup after being cooked, one serving of nuts equals 1 ounces, and one serving of seeds equals  ounce or 1 tablespoon.  You may need to monitor your salt (sodium) intake, especially if you have high blood pressure. Talk with your health care provider or dietitian to get  more information about reducing sodium. What foods can I eat? Grains  Breads, including [place], white, pita, wheat, raisin, rye, oatmeal, and [place]. Tortillas that are neither fried nor made with lard or trans fat. Low-fat rolls, including hotdog and hamburger buns and English muffins. Biscuits. Muffins. Waffles. Pancakes. Light popcorn. Whole-grain cereals. Flatbread.  Melba toast. Pretzels. Breadsticks. Rusks. Low-fat snacks and crackers, including oyster, saltine, matzo, graham, animal, and rye. Rice and pasta, including brown rice and those that are made with whole wheat. Vegetables All vegetables. Fruits All fruits, but limit coconut. Meats and Other Protein Sources Lean, well-trimmed beef, veal, pork, and lamb. Chicken and [place] without skin. All fish and shellfish. Wild duck, rabbit, pheasant, and venison. Egg whites or low-cholesterol egg substitutes. Dried beans, peas, lentils, and tofu.Seeds and most nuts. Dairy Low-fat or nonfat cheeses, including ricotta, string, and mozzarella. Skim or 1% milk that is liquid, powdered, or evaporated. Buttermilk that is made with low-fat milk. Nonfat or low-fat yogurt. Beverages Mineral water. Diet carbonated beverages. Sweets and Desserts Sherbets and fruit ices. Honey, jam, marmalade, jelly, and syrups. Meringues and gelatins. Pure sugar candy, such as hard candy, jelly beans, gumdrops, mints, marshmallows, and small amounts of dark chocolate. [employer] Eat all sweets and desserts in moderation. Fats and Oils Nonhydrogenated (trans-free) margarines. Vegetable oils, including soybean, sesame, sunflower, olive, peanut, safflower, corn, canola, and cottonseed. Salad dressings or mayonnaise that are made with a vegetable oil. Limit added fats and oils that you use for cooking, baking, salads, and as spreads. Other Cocoa powder. Coffee and tea. All seasonings and condiments. The items listed above may not be a complete list of recommended foods or beverages. Contact your dietitian for more options. What foods are not recommended? Grains Breads that are made with saturated or trans fats, oils, or whole milk. Croissants. Butter rolls. Cheese breads. Sweet rolls. Donuts. Buttered popcorn. Chow mein noodles. High-fat crackers, such as cheese or butter crackers. Meats and Other Protein Sources Fatty meats, such  as hotdogs, short ribs, sausage, spareribs, bacon, ribeye roast or steak, and mutton. High-fat deli meats, such as salami and bologna. Caviar. Domestic duck and goose. Organ meats, such as kidney, liver, sweetbreads, brains, gizzard, chitterlings, and heart. Dairy Cream, sour cream, cream cheese, and creamed cottage cheese. Whole milk cheeses, including blue (bleu), Monte[place] Jack, [place], [place], [place], [place], [place], [place], [place], and [place]. Whole or 2% milk that is liquid, evaporated, or condensed. Whole buttermilk. Cream sauce or high-fat cheese sauce. Yogurt that is made from whole milk. Beverages Regular sodas and drinks with added sugar. Sweets and Desserts Frosting. Pudding. Cookies. Cakes other than angel food cake. Candy that has milk chocolate or white chocolate, hydrogenated fat, butter, coconut, or unknown ingredients. Buttered syrups. Full-fat ice cream or ice cream drinks. Fats and Oils Gravy that has suet, meat fat, or shortening. Cocoa butter, hydrogenated oils, palm oil, coconut oil, palm kernel oil. These can often be found in baked products, candy, fried foods, nondairy creamers, and whipped toppings. Solid fats and shortenings, including bacon fat, salt pork, lard, and butter. Nondairy cream substitutes, such as coffee creamers and sour cream substitutes. Salad dressings that are made of unknown oils, cheese, or sour cream. The items listed above may not be a complete list of foods and beverages to avoid. Contact your dietitian for more information. This information is not intended to replace advice given to you by your health care provider. Make sure you discuss any questions you have with your health care  provider. Document Released: 01/14/2008 Document Revised: 10/25/2015 Document Reviewed: 09/28/2013 Elsevier Interactive Patient Education  2017 [place].    Sciatica Sciatica is pain, numbness, weakness, or tingling along the path of the sciatic nerve. The  sciatic nerve starts in the lower back and runs down the back of each leg. The nerve controls the muscles in the lower leg and in the back of the knee. It also provides feeling (sensation) to the back of the thigh, the lower leg, and the sole of the foot. Sciatica is a symptom of another medical condition that pinches or puts pressure on the sciatic nerve. Generally, sciatica only affects one side of the body. Sciatica usually goes away on its own or with treatment. In some cases, sciatica may keep coming back (recur). What are the causes? This condition is caused by pressure on the sciatic nerve, or pinching of the sciatic nerve. This may be the result of:  A disk in between the bones of the spine (vertebrae) bulging out too far (herniated disk).  Age-related changes in the spinal disks (degenerative disk disease).  A pain disorder that affects a muscle in the buttock (piriformis syndrome).  Extra bone growth (bone spur) near the sciatic nerve.  An injury or break (fracture) of the pelvis.  Pregnancy.  Tumor (rare).  What increases the risk? The following factors may make you more likely to develop this condition:  Playing sports that place pressure or stress on the spine, such as football or weight lifting.  Having poor strength and flexibility.  A history of back injury.  A history of back surgery.  Sitting for long periods of time.  Doing activities that involve repetitive bending or lifting.  Obesity.  What are the signs or symptoms? Symptoms can vary from mild to very severe, and they may include:  Any of these problems in the lower back, leg, hip, or buttock: ? Mild tingling or dull aches. ? Burning sensations. ? Sharp pains.  Numbness in the back of the calf or the sole of the foot.  Leg weakness.  Severe back pain that makes movement difficult.  These symptoms may get worse when you cough, sneeze, or laugh, or when you sit or stand for long periods of time.  Being overweight may also make symptoms worse. In some cases, symptoms may recur over time. How is this diagnosed? This condition may be diagnosed based on:  Your symptoms.  A physical exam. Your health care provider may ask you to do certain movements to check whether those movements trigger your symptoms.  You may have tests, including: ? Blood tests. ? X-rays. ? MRI. ? CT scan.  How is this treated? In many cases, this condition improves on its own, without any treatment. However, treatment may include:  Reducing or modifying physical activity during periods of pain.  Exercising and stretching to strengthen your abdomen and improve the flexibility of your spine.  Icing and applying heat to the affected area.  Medicines that help: ? To relieve pain and swelling. ? To relax your muscles.  Injections of medicines that help to relieve pain, irritation, and inflammation around the sciatic nerve (steroids).  Surgery.  Follow these instructions at home: Medicines  Take over-the-counter and prescription medicines only as told by your health care provider.  Do not drive or operate heavy machinery while taking prescription pain medicine. Managing pain  If directed, apply ice to the affected area. ? Put ice in a plastic bag. ? Place a towel  between your skin and the bag. ? Leave the ice on for 20 minutes, 2-3 times a day.  After icing, apply heat to the affected area before you exercise or as often as told by your health care provider. Use the heat source that your health care provider recommends, such as a moist heat pack or a heating pad. ? Place a towel between your skin and the heat source. ? Leave the heat on for 20-30 minutes. ? Remove the heat if your skin turns bright red. This is especially important if you are unable to feel pain, heat, or cold. You may have a greater risk of getting burned. Activity  Return to your normal activities as told by your health care  provider. Ask your health care provider what activities are safe for you. ? Avoid activities that make your symptoms worse.  Take brief periods of rest throughout the day. Resting in a lying or standing position is usually better than sitting to rest. ? When you rest for longer periods, mix in some mild activity or stretching between periods of rest. This will help to prevent stiffness and pain. ? Avoid sitting for long periods of time without moving. Get up and move around at least one time each hour.  Exercise and stretch regularly, as told by your health care provider.  Do not lift anything that is heavier than 10 lb (4.5 kg) while you have symptoms of sciatica. When you do not have symptoms, you should still avoid heavy lifting, especially repetitive heavy lifting.  When you lift objects, always use proper lifting technique, which includes: ? Bending your knees. ? Keeping the load close to your body. ? Avoiding twisting. General instructions  Use good posture. ? Avoid leaning forward while sitting. ? Avoid hunching over while standing.  Maintain a healthy weight. Excess weight puts extra stress on your back and makes it difficult to maintain good posture.  Wear supportive, comfortable shoes. Avoid wearing high heels.  Avoid sleeping on a mattress that is too soft or too hard. A mattress that is firm enough to support your back when you sleep may help to reduce your pain.  Keep all follow-up visits as told by your health care provider. This is important. Contact a health care provider if:  You have pain that wakes you up when you are sleeping.  You have pain that gets worse when you lie down.  Your pain is worse than you have experienced in the past.  Your pain lasts longer than 4 weeks.  You experience unexplained weight loss. Get help right away if:  You lose control of your bowel or bladder (incontinence).  You have: ? Weakness in your lower back, pelvis, buttocks,  or legs that gets worse. ? Redness or swelling of your back. ? A burning sensation when you urinate. This information is not intended to replace advice given to you by your health care provider. Make sure you discuss any questions you have with your health care provider. Document Released: 03/31/2001 Document Revised: 09/10/2015 Document Reviewed: 12/14/2014 Elsevier Interactive Patient Education  2017 [place].   Back Exercises The following exercises strengthen the muscles that help to support the back. They also help to keep the lower back flexible. Doing these exercises can help to prevent back pain or lessen existing pain. If you have back pain or discomfort, try doing these exercises 2-3 times each day or as told by your health care provider. When the pain goes away, do them  once each day, but increase the number of times that you repeat the steps for each exercise (do more repetitions). If you do not have back pain or discomfort, do these exercises once each day or as told by your health care provider. Exercises Single Knee to Chest  Repeat these steps 3-5 times for each leg: 1. Lie on your back on a firm bed or the floor with your legs extended. 2. Bring one knee to your chest. Your other leg should stay extended and in contact with the floor. 3. Hold your knee in place by grabbing your knee or thigh. 4. Pull on your knee until you feel a gentle stretch in your lower back. 5. Hold the stretch for 10-30 seconds. 6. Slowly release and straighten your leg.  Pelvic Tilt  Repeat these steps 5-10 times: 1. Lie on your back on a firm bed or the floor with your legs extended. 2. Bend your knees so they are pointing toward the ceiling and your feet are flat on the floor. 3. Tighten your lower abdominal muscles to press your lower back against the floor. This motion will tilt your pelvis so your tailbone points up toward the ceiling instead of pointing to your feet or the  floor. 4. With gentle tension and even breathing, hold this position for 5-10 seconds.  Cat-Cow  Repeat these steps until your lower back becomes more flexible: 1. Get into a hands-and-knees position on a firm surface. Keep your hands under your shoulders, and keep your knees under your hips. You may place padding under your knees for comfort. 2. Let your head hang down, and point your tailbone toward the floor so your lower back becomes rounded like the back of a cat. 3. Hold this position for 5 seconds. 4. Slowly lift your head and point your tailbone up toward the ceiling so your back forms a sagging arch like the back of a cow. 5. Hold this position for 5 seconds.  Press-Ups  Repeat these steps 5-10 times: 1. Lie on your abdomen (face-down) on the floor. 2. Place your palms near your head, about shoulder-width apart. 3. While you keep your back as relaxed as possible and keep your hips on the floor, slowly straighten your arms to raise the top half of your body and lift your shoulders. Do not use your back muscles to raise your upper torso. You may adjust the placement of your hands to make yourself more comfortable. 4. Hold this position for 5 seconds while you keep your back relaxed. 5. Slowly return to lying flat on the floor.  Bridges  Repeat these steps 10 times: 1. Lie on your back on a firm surface. 2. Bend your knees so they are pointing toward the ceiling and your feet are flat on the floor. 3. Tighten your buttocks muscles and lift your buttocks off of the floor until your waist is at almost the same height as your knees. You should feel the muscles working in your buttocks and the back of your thighs. If you do not feel these muscles, slide your feet 1-2 inches farther away from your buttocks. 4. Hold this position for 3-5 seconds. 5. Slowly lower your hips to the starting position, and allow your buttocks muscles to relax completely.  If this exercise is too easy, try  doing it with your arms crossed over your chest. Abdominal Crunches  Repeat these steps 5-10 times: 1. Lie on your back on a firm bed or the floor with  your legs extended. 2. Bend your knees so they are pointing toward the ceiling and your feet are flat on the floor. 3. Cross your arms over your chest. 4. Tip your chin slightly toward your chest without bending your neck. 5. Tighten your abdominal muscles and slowly raise your trunk (torso) high enough to lift your shoulder blades a tiny bit off of the floor. Avoid raising your torso higher than that, because it can put too much stress on your low back and it does not help to strengthen your abdominal muscles. 6. Slowly return to your starting position.  Back Lifts Repeat these steps 5-10 times: 1. Lie on your abdomen (face-down) with your arms at your sides, and rest your forehead on the floor. 2. Tighten the muscles in your legs and your buttocks. 3. Slowly lift your chest off of the floor while you keep your hips pressed to the floor. Keep the back of your head in line with the curve in your back. Your eyes should be looking at the floor. 4. Hold this position for 3-5 seconds. 5. Slowly return to your starting position.  Contact a health care provider if:  Your back pain or discomfort gets much worse when you do an exercise.  Your back pain or discomfort does not lessen within 2 hours after you exercise. If you have any of these problems, stop doing these exercises right away. Do not do them again unless your health care provider says that you can. Get help right away if:  You develop sudden, severe back pain. If this happens, stop doing the exercises right away. Do not do them again unless your health care provider says that you can. This information is not intended to replace advice given to you by your health care provider. Make sure you discuss any questions you have with your health care provider. Document Released: 05/14/2004  Document Revised: 08/14/2015 Document Reviewed: 05/31/2014 Elsevier Interactive Patient Education  2017 [place].  Please use Nicoderm patches as directed. Increase water intake, strive for at least 75 ounces/day.   Follow Heart Healthy diet Increase regular exercise.  Recommend at least 30 minutes daily, 5 days per week of walking, jogging, biking, swimming, YouTube/Pinterest workout videos. Stretch daily. Please continue with [facility]/GI specialist as directed. Refrain from any alcohol use. Please schedule full physical with fasting labs in the next few weeks. WELCOME TO THE PRACTICE!

## 2017-03-31 NOTE — Assessment & Plan Note (Addendum)
Pain clinic referral placed. Continue with [facility] specialist as directed. AVOID ETOH [place] Controlled Substance Database reviewed.

## 2017-03-31 NOTE — Assessment & Plan Note (Signed)
Advised to completley abstain from ETOH-discussed at length. She verbalized understanding/agreement.

## 2017-03-31 NOTE — Assessment & Plan Note (Signed)
Please use Nicoderm patches as directed. Increase water intake, strive for at least 75 ounces/day.   Follow Heart Healthy diet Increase regular exercise.  Recommend at least 30 minutes daily, 5 days per week of walking, jogging, biking, swimming, YouTube/Pinterest workout videos. Stretch daily. Please continue with [facility] specialist as directed. Refrain from any alcohol use. Please schedule full physical with fasting labs in the next few weeks.

## 2017-04-02 ENCOUNTER — Other Ambulatory Visit: Payer: BLUE CROSS/BLUE SHIELD

## 2017-04-06 ENCOUNTER — Emergency Department (HOSPITAL_COMMUNITY)
Admission: EM | Admit: 2017-04-06 | Discharge: 2017-04-06 | Disposition: A | Discharge status: Home / Self Care | Payer: BLUE CROSS/BLUE SHIELD | Attending: Physician Assistant | Admitting: Physician Assistant

## 2017-04-06 ENCOUNTER — Encounter (HOSPITAL_COMMUNITY): Payer: Self-pay

## 2017-04-06 ENCOUNTER — Other Ambulatory Visit: Payer: Self-pay

## 2017-04-06 DIAGNOSIS — Z79899 Other long term (current) drug therapy: Secondary | ICD-10-CM | POA: Diagnosis not present

## 2017-04-06 DIAGNOSIS — M5431 Sciatica, right side: Secondary | ICD-10-CM | POA: Diagnosis not present

## 2017-04-06 DIAGNOSIS — F17210 Nicotine dependence, cigarettes, uncomplicated: Secondary | ICD-10-CM | POA: Insufficient documentation

## 2017-04-06 DIAGNOSIS — M545 Low back pain: Secondary | ICD-10-CM | POA: Diagnosis present

## 2017-04-06 MED ORDER — HYDROCODONE-ACETAMINOPHEN 5-325 MG PO TABS
1.0000 | ORAL_TABLET | Freq: Once | ORAL | Status: AC
  Administered 2017-04-06: 1 via ORAL
  Filled 2017-04-06: qty 1

## 2017-04-06 MED ORDER — HYDROCODONE-ACETAMINOPHEN 5-325 MG PO TABS: 1.0000 | ORAL_TABLET | Freq: Four times a day (QID) | ORAL | 0 refills | Status: DC | PRN

## 2017-04-06 MED ORDER — KETOROLAC TROMETHAMINE 30 MG/ML IJ SOLN
30.0000 mg | Freq: Once | INTRAMUSCULAR | Status: AC
  Administered 2017-04-06: 30 mg via INTRAMUSCULAR
  Filled 2017-04-06: qty 1

## 2017-04-06 MED ORDER — LIDOCAINE 5 % EX PTCH
1.0000 | MEDICATED_PATCH | CUTANEOUS | Status: DC
  Administered 2017-04-06: 1 via TRANSDERMAL
  Filled 2017-04-06: qty 1

## 2017-04-06 MED ORDER — MELOXICAM 7.5 MG PO TABS: 7.5000 mg | ORAL_TABLET | Freq: Every day | ORAL | 0 refills | Status: DC

## 2017-04-06 MED FILL — MELOXICAM 7.5 MG TABLET: 7.5 | 10 days supply | Qty: 10 | Fill #0

## 2017-04-06 MED FILL — HYDROCODON-APAP 5-325: 5-325 | 2 days supply | Qty: 5 | Fill #0

## 2017-04-06 NOTE — ED Triage Notes (Signed)
Pt reports she has been treated for sciatica over the several months.Pt reports pain that shoots across back and down right leg. Denies bowel/ bladder changes. Pt tearful in triage.

## 2017-04-06 NOTE — Discharge Instructions (Signed)
Please do not take any NSAIDS such as aleve, ibuprofen, motrin, or naproxen while taking mobic.  Please follow up with your doctor regarding your ED visit and symptoms today.  If you develop any changes to bowel or bladder function, or experience numbness/tingling to your upper inner legs or genitals then please return to the emergency room.  Today you received medications that may make you sleepy or impair your ability to make decisions.  For the next 24 hours please do not drive, operate heavy machinery, care for a small child with out another adult present, or perform any activities that may cause harm to you or someone else if you were to fall asleep or be impaired.   You are being prescribed a medication which may make you sleepy. Please follow up of listed precautions for at least 24 hours after taking one dose.

## 2017-04-06 NOTE — ED Provider Notes (Signed)
[facility] EMERGENCY DEPARTMENT Provider Note   CSN: [id number] Arrival date & time: 04/06/17  7824     History   Chief Complaint Chief Complaint  Patient presents with  . Back Pain    HPI [name] is a 61 y.o. female with a history of alcohol abuse, anxiety, and sciatica who presents today for evaluation of right buttock pain.  She reports that she has been having sciatic type pain for the past 3 months and has been seen by her PCP and in the emergency room for this since it started.  She reports that her pain has been worse over the past few weeks.  She denies any specific event that caused the worsening, rather that it is just a gradual worsening.  She was seen in the ER on the 11th and given a prescription for 20 Norco.  She has a MRI scheduled for Thursday.  She denies any personal history of cancer or IV drug use.  She does not have any numbness or tingling to her bilateral lower extremities, including upper inner thighs or genitals.  Denies changes to bowel or bladder function.  With her previous ED visit she was treated with steroids which she said provided very mild relief.  She reports that the norco she was given helped with her pain and made it bearable.  She has been taking ibuprofen.    HPI  Past Medical History:  Diagnosis Date  . Alcohol abuse    H/o withdrawal   . Alcohol withdrawal ([place]) 10/02/2013  . Anxiety   . Chronic diarrhea    hx of  . Constipation   . Depression   . Elevated liver function tests few yrs ago  . Pancreatitis   . PONV (postoperative nausea and vomiting)   . Rosacea   . Wears glasses     Patient Active Problem List   Diagnosis Date Noted  . Pancreatic pain 03/31/2017  . Sciatica of right side 02/25/2017  . Tobacco use disorder 02/25/2017  . Incisional hernia s/p lap/open repair with mesh 07/09/2016 07/09/2016  . Chronic pancreatitis ([place]) 04/18/2016  . Pancreatic calcification 04/03/2016  . Abdominal pain,  right upper quadrant 04/03/2016  . Healthcare maintenance 04/03/2016  . Major depressive disorder, recurrent, severe without psychotic features ([place])   . Substance induced mood disorder ([place]) 11/06/2014  . Bilateral knee pain 10/14/2014  . Alcohol use disorder, severe, dependence ([place]) 08/17/2014  . Suicidal behavior   . Alcohol dependence ([place]) 10/02/2013  . Major depression, recurrent ([place]) 10/02/2013  . Gastric ulcer 01/20/2012  . Fatty liver 10/11/2011  . 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 Elevated liver function tests   . GERD (gastroesophageal reflux disease)   . ESOPHAGEAL MOTILITY DISORDER 10/01/2008  . HIATAL HERNIA 10/01/2008  . FATTY LIVER DISEASE 10/01/2008    Past Surgical History:  Procedure Laterality Date  . CHOLECYSTECTOMY    . COLONOSCOPY  Never  . DIAGNOSTIC MAMMOGRAM  2008  . ERCP  10/06/2011   Procedure: ENDOSCOPIC RETROGRADE CHOLANGIOPANCREATOGRAPHY (ERCP);  Surgeon: Beryle Beams, MD;  Location: [facility];  Service: Endoscopy;  Laterality: N/A;  . ESOPHAGOGASTRODUODENOSCOPY  03/21/2011   Procedure: ESOPHAGOGASTRODUODENOSCOPY (EGD);  Surgeon: Scarlette Shorts, MD;  Location: [name] ENDOSCOPY;  Service: Endoscopy;  Laterality: N/A;  Patient may need to be done at bedside tomorrow depending on status  . ESOPHAGOGASTRODUODENOSCOPY  01/06/2012   Procedure: ESOPHAGOGASTRODUODENOSCOPY (EGD);  Surgeon: Wonda Horner, MD;  Location: [facility] ENDOSCOPY;  Service: Endoscopy;  Laterality: N/A;  bedside  . HERNIA REPAIR    . INSERTION OF MESH  07/09/2016   Procedure: INSERTION OF MESH;  Surgeon: Michael Boston, MD;  Location: [facility]S;  Service: General;;  . LAPAROTOMY  01/03/2012   Procedure: EXPLORATORY LAPAROTOMY;  Surgeon: Pedro Earls, MD;  Location: [facility];  Service: General;  Laterality: N/A;  debridement of necrotic pancreas and placement of drains x2  . pancreatic cyst removed   2015  . SHOULDER SURGERY Left 02/18/2010   clavical repair  . TUBAL LIGATION    . VENTRAL HERNIA  REPAIR N/A 07/09/2016   Procedure: LAPAROSCOPIC LYSIS OF ADHESIONS  VENTRAL WALL HERNIA REPAIR WITH MESH EXCISION OF HERNIA [place];  Surgeon: Michael Boston, MD;  Location: [facility];  Service: General;  Laterality: N/A;    OB History    No data available       Home Medications    Prior to Admission medications   Medication Sig Start Date End Date Taking? Authorizing Provider  Calcium Carbonate-Vitamin D (CALCIUM 600+D PO) Take 1 tablet by mouth daily.    [provider]  docusate sodium (COLACE) 250 MG capsule Take 1 capsule (250 mg total) by mouth daily. While taking pain medication 03/30/17   Duffy Bruce, MD  HYDROcodone-acetaminophen (NORCO/VICODIN) 5-325 MG tablet Take 1 tablet by mouth every 6 (six) hours as needed. 04/06/17   Lorin Glass, PA-C  meloxicam (MOBIC) 7.5 MG tablet Take 1 tablet (7.5 mg total) by mouth daily. 04/06/17   Lorin Glass, PA-C  methylPREDNISolone (MEDROL DOSEPAK) 4 MG TBPK tablet Take as directed on package 03/30/17   Duffy Bruce, MD  Multiple Vitamin (MULTIVITAMIN WITH MINERALS) TABS tablet Take 1 tablet by mouth daily.    [provider]  nicotine (NICODERM CQ) 14 mg/24hr patch Place 1 patch (14 mg total) onto the skin daily. 03/31/17   Danford, Valetta Fuller D, NP  ondansetron (ZOFRAN ODT) 4 MG disintegrating tablet Take 1 tablet (4 mg total) by mouth every 8 (eight) hours as needed for nausea or vomiting. 03/30/17   Duffy Bruce, MD  oxyCODONE-acetaminophen (PERCOCET/ROXICET) 5-325 MG tablet Take 1-2 tablets by mouth every 4 (four) hours as needed for moderate pain or severe pain. 03/30/17   Duffy Bruce, MD  valACYclovir (VALTREX) 1000 MG tablet Take 1,000 mg by mouth daily as needed (fever blisters).  04/10/15   [provider]    Family History Family History  Problem Relation Age of Onset  . Stroke Mother   . Heart disease Father   . Heart failure Father   . Heart disease Sister     Social History Social  History   Tobacco Use  . Smoking status: Light Tobacco Smoker    Packs/day: 0.25    Years: 15.00    Pack years: 3.75    Types: Cigarettes  . Smokeless tobacco: Never Used  . Tobacco comment: trying nicotine patch  Substance Use Topics  . Alcohol use: No    Alcohol/week: 1.5 - 2.0 oz    Types: 3 - 4 Standard drinks or equivalent per week    Comment: quit 2016  . Drug use: Yes    Types: Marijuana     Allergies   Ciprofloxacin   Review of Systems Review of Systems  Constitutional: Negative for chills and fever.  HENT: Negative for ear pain and sore throat.   Eyes: Negative for pain and visual disturbance.  Respiratory: Negative for cough and shortness of breath.   Cardiovascular: Negative for chest pain  and palpitations.  Gastrointestinal: Negative for abdominal pain and vomiting.  Genitourinary: Negative for dysuria and hematuria.  Musculoskeletal: Positive for back pain. Negative for arthralgias, gait problem, neck pain and neck stiffness.       Electric pain shooting down the right leg and buttock  Skin: Negative for color change and rash.  Neurological: Negative for seizures, syncope, numbness and headaches.  All other systems reviewed and are negative.    Physical Exam Updated Vital Signs BP 138/84   Pulse 73   Temp 98.2 F (36.8 C)   Resp (!) 21   Ht 5\' 7"  (1.702 m)   Wt 70.3 kg (155 lb)   LMP 12/08/2006   SpO2 100%   BMI 24.28 kg/m   Physical Exam  Constitutional: She is oriented to person, place, and time. She appears well-developed and well-nourished. No distress.  HENT:  Head: Normocephalic and atraumatic.  Mouth/Throat: Oropharynx is clear and moist.  Eyes: Conjunctivae are normal.  Neck: Normal range of motion. Neck supple.  Cardiovascular: Normal rate, regular rhythm and intact distal pulses.  No murmur heard. Pulmonary/Chest: Effort normal and breath sounds normal. No respiratory distress.  Abdominal: Soft. There is no tenderness.    Musculoskeletal: She exhibits tenderness. She exhibits no edema or deformity.  Palpation of right lower back, right sciatic notch both re-create and exacerbate her reported pain exactly.   Neurological: She is alert and oriented to person, place, and time.  Sensation intact and symmetrical to bilateral lower extremities.    Skin: Skin is warm and dry.  Psychiatric: She has a normal mood and affect. Her behavior is normal.  Nursing note and vitals reviewed.    ED Treatments / Results  Labs (all labs ordered are listed, but only abnormal results are displayed) Labs Reviewed - No data to display  EKG  EKG Interpretation None       Radiology No results found.  Procedures Procedures (including critical care time)  Medications Ordered in ED Medications  HYDROcodone-acetaminophen (NORCO/VICODIN) 5-325 MG per tablet 1 tablet (1 tablet Oral Given 04/06/17 1222)  ketorolac (TORADOL) 30 MG/ML injection 30 mg (30 mg Intramuscular Given 04/06/17 1223)     Initial Impression / Assessment and Plan / ED Course  I have reviewed the triage vital signs and the nursing notes.  Pertinent labs & imaging results that were available during my care of the patient were reviewed by me and considered in my medical decision making (see chart for details).  Clinical Course as of Apr 06 2105  Tue Apr 06, 2017  1026 Attempted to see patient, not in room.   [EH]  1050 Patient in room  [EH]  1159 Was informed that patient was laying on the floor crying and saying she couldn't live like this with pain.  I assessed patient, she denies suicidal ideation, expresses upset that she is still in her chronic pain.  Patient was re-directable into bed.  Her boyfriend is with her and has agreed to stay in room with her.    [EH]    Clinical Course User Index [EH] Lorin Glass, PA-C   Patient with back pain.  No neurological deficits and normal neuro exam.  Patient can walk but states is painful.  No loss  of bowel or bladder control.  No concern for cauda equina.  No fever, night sweats, weight loss, h/o cancer, IVDU.  RICE protocol and pain medicine indicated and discussed with patient. She was instructed to maintain her appointment for MRI  and to follow up with PCP as needed.  She was given rx for 5 norco to get her through until MRI.  After patient's pain was adequately treated she was ambulatory, denied SI, HI or AVH.  She explained that she was just tired of waiting for pain medicine and was not truly suicidal.  She does not have any plans to hurt her self and has not tried anything recently.  She was given strict return precautions for both her back pain and the statements she was making about not being able to live with this pain and stated her understanding.  She was given very short course of meloxicam which I feel is safer for her as she reports taking excess ibuprofen as this is a one pill once a day treatment option.  She was instructed to monitor her bowel movements for dark, tarry, sticky stools.     Final Clinical Impressions(s) / ED Diagnoses   Final diagnoses:  Sciatica of right side    ED Discharge Orders        Ordered    meloxicam (MOBIC) 7.5 MG tablet  Daily     04/06/17 1432    HYDROcodone-acetaminophen (NORCO/VICODIN) 5-325 MG tablet  Every 6 hours PRN     04/06/17 1432       Ollen Gross 04/06/17 2107    Macarthur Critchley, MD 04/14/17 2333

## 2017-04-06 NOTE — ED Notes (Signed)
Patient now in room

## 2017-04-14 ENCOUNTER — Encounter: Payer: BLUE CROSS/BLUE SHIELD | Admitting: Adult Health

## 2017-04-14 NOTE — Progress Notes (Deleted)
Subjective:    Patient ID: [name], female    DOB: Sep 02, 1955, 61 y.o.   MRN: [id number]  HPI: 03/31/17 OV: [name] is here to establish as a new pt.  She is a pleasant 61 year old female.  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  Resumed heavy drinking at age 72 (2-3 bottles/day) and quit again 2014 after a suicide attempt 2014.   CBT 2014-2015 Started using tobacco again 2014, now smoking pack/day Nov 2017- severe abdominal pain and ultimately dx'd with chronic pancreatis with calcification.   April 2018, July 20180- underwent lithotripsy at [facility]/GI specialist, next f/u Feb 2019 Chronic pancreatic pain that has caused her to seek pain control at previous PCP and multiple UCs/EDs. She reports being "on waiting list for [facility] for > 1 year". She denies regular ETOH [place], however did admit to drinking "a few drinks the other day b/c my back hurt so much". Seen in ED 03/30/17, re: lumbar back pain with sciatica.  Provided narcotics and referral to [facility] for  Sciatica, she is awaiting call to make initial appt. She denies current sx's of depression or thoughts of harming herself or others.  She reports a strong support system of her boyfriend, friends, and her son is in [place].  04/14/17 OV: [name] is here for CPE. Healthcare Maintenance: PAP Mammogram Colonoscopy   Patient Care Team    Relationship Specialty Notifications Start End  Dalzell, Valetta Fuller D, NP PCP - General Family Medicine  04/01/17   Michael Boston, MD Consulting Physician General Surgery  05/25/16     Patient Active Problem List   Diagnosis Date Noted  . Pancreatic pain 03/31/2017  . Sciatica of right side 02/25/2017  . Tobacco use disorder 02/25/2017  . Incisional hernia s/p lap/open repair with mesh 07/09/2016 07/09/2016  . Chronic  pancreatitis ([place]) 04/18/2016  . Pancreatic calcification 04/03/2016  . Abdominal pain, right upper quadrant 04/03/2016  . Healthcare maintenance 04/03/2016  . Major depressive disorder, recurrent, severe without psychotic features ([place])   . Substance induced mood disorder ([place]) 11/06/2014  . Bilateral knee pain 10/14/2014  . Alcohol use disorder, severe, dependence ([place]) 08/17/2014  . Suicidal behavior   . Alcohol dependence ([place]) 10/02/2013  . Major depression, recurrent ([place]) 10/02/2013  . Gastric ulcer 01/20/2012  . Fatty liver 10/11/2011  . 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 Elevated liver function tests   . GERD (gastroesophageal reflux disease)   . ESOPHAGEAL MOTILITY DISORDER 10/01/2008  . HIATAL HERNIA 10/01/2008  . FATTY LIVER DISEASE 10/01/2008     Past Medical History:  Diagnosis Date  . Alcohol abuse    H/o withdrawal   . Alcohol withdrawal ([place]) 10/02/2013  . Anxiety   . Chronic diarrhea    hx of  . Constipation   . Depression   . Elevated liver function tests few yrs ago  . Pancreatitis   . PONV (postoperative nausea and vomiting)   . Rosacea   . Wears glasses      Past Surgical History:  Procedure Laterality Date  . CHOLECYSTECTOMY    . COLONOSCOPY  Never  . DIAGNOSTIC MAMMOGRAM  2008  . ERCP  10/06/2011   Procedure: ENDOSCOPIC RETROGRADE CHOLANGIOPANCREATOGRAPHY (ERCP);  Surgeon: Beryle Beams, MD;  Location: [facility];  Service: Endoscopy;  Laterality: N/A;  . ESOPHAGOGASTRODUODENOSCOPY  03/21/2011   Procedure: ESOPHAGOGASTRODUODENOSCOPY (EGD);  Surgeon: Scarlette Shorts, MD;  [facility] ENDOSCOPY;  Service: Endoscopy;  Laterality: N/A;  Patient may need to be done at bedside tomorrow depending on status  . ESOPHAGOGASTRODUODENOSCOPY  01/06/2012   Procedure: ESOPHAGOGASTRODUODENOSCOPY (EGD);  Surgeon: Wonda Horner, MD;  Location: [facility] END[facility];  Service: Endoscopy;  Laterality: N/A;  bedside  . HERNIA REPAIR    . INSERTION OF MESH  07/09/2016    Procedure: INSERTION OF MESH;  Surgeon: Michael Boston, MD;  Location: [facility];  Service: General;;  . LAPAROTOMY  01/03/2012   Procedure: EXPLORATORY LAPAROTOMY;  Surgeon: Pedro Earls, MD;  Location: [facility];  Service: General;  Laterality: N/A;  debridement of necrotic pancreas and placement of drains x2  . pancreatic cyst removed   2015  . SHOULDER SURGERY Left 02/18/2010   clavical repair  . TUBAL LIGATION    . VENTRAL HERNIA REPAIR N/A 07/09/2016   Procedure: LAPAROSCOPIC LYSIS OF ADHESIONS  VENTRAL WALL HERNIA REPAIR WITH MESH EXCISION OF HERNIA [place];  Surgeon: Michael Boston, MD;  Location: [facility];  Service: General;  Laterality: N/A;     Family History  Problem Relation Age of Onset  . Stroke Mother   . Heart disease Father   . Heart failure Father   . Heart disease Sister      Social History   Substance and Sexual Activity  Drug Use Yes  . Types: Marijuana     Social History   Substance and Sexual Activity  Alcohol Use No  . Alcohol/week: 1.5 - 2.0 oz  . Types: 3 - 4 Standard drinks or equivalent per week   Comment: quit 2016     Social History   Tobacco Use  Smoking Status Light Tobacco Smoker  . Packs/day: 0.25  . Years: 15.00  . Pack years: 3.75  . Types: Cigarettes  Smokeless Tobacco Never Used  Tobacco Comment   trying nicotine patch     Outpatient Encounter Medications as of 04/14/2017  Medication Sig  . Calcium Carbonate-Vitamin D (CALCIUM 600+D PO) Take 1 tablet by mouth daily.  Marland Kitchen docusate sodium (COLACE) 250 MG capsule Take 1 capsule (250 mg total) by mouth daily. While taking pain medication  . HYDROcodone-acetaminophen (NORCO/VICODIN) 5-325 MG tablet Take 1 tablet by mouth every 6 (six) hours as needed.  . meloxicam (MOBIC) 7.5 MG tablet Take 1 tablet (7.5 mg total) by mouth daily.  . methylPREDNISolone (MEDROL DOSEPAK) 4 MG TBPK tablet Take as directed on package  . Multiple Vitamin (MULTIVITAMIN WITH MINERALS) TABS tablet Take 1 tablet by  mouth daily.  . nicotine (NICODERM CQ) 14 mg/24hr patch Place 1 patch (14 mg total) onto the skin daily.  . ondansetron (ZOFRAN ODT) 4 MG disintegrating tablet Take 1 tablet (4 mg total) by mouth every 8 (eight) hours as needed for nausea or vomiting.  Marland Kitchen oxyCODONE-acetaminophen (PERCOCET/ROXICET) 5-325 MG tablet Take 1-2 tablets by mouth every 4 (four) hours as needed for moderate pain or severe pain.  . valACYclovir (VALTREX) 1000 MG tablet Take 1,000 mg by mouth daily as needed (fever blisters).    No facility-administered encounter medications on file as of 04/14/2017.     Allergies: Ciprofloxacin  There is no height or weight on file to calculate BMI.  Last menstrual period 12/08/2006.    Review of Systems  Constitutional: Positive for fatigue. Negative for activity change, appetite change, chills, diaphoresis, fever and unexpected weight change.  HENT: Negative for congestion.   Eyes: Negative for visual disturbance.  Respiratory: Positive  for cough. Negative for chest tightness, shortness of breath, wheezing and stridor.   Cardiovascular: Negative for chest pain, palpitations and leg swelling.  Gastrointestinal: Positive for abdominal pain and nausea. Negative for abdominal distention, blood in stool, constipation, diarrhea and vomiting.  Endocrine: Negative for cold intolerance, heat intolerance, polydipsia, polyphagia and polyuria.  Genitourinary: Negative for difficulty urinating, flank pain and hematuria.  Musculoskeletal: Positive for arthralgias, back pain and myalgias. Negative for gait problem, joint swelling, neck pain and neck stiffness.  Skin: Negative for color change, pallor, rash and wound.  Neurological: Negative for dizziness, syncope, weakness and headaches.  Hematological: Does not bruise/bleed easily.  Psychiatric/Behavioral: Positive for sleep disturbance. Negative for decreased concentration, dysphoric mood, hallucinations, self-injury and suicidal ideas.  The patient is not nervous/anxious and is not hyperactive.        Objective:   Physical Exam  Constitutional: She is oriented to person, place, and time. She appears well-developed and well-nourished. No distress.  HENT:  Head: Normocephalic and atraumatic.  Right Ear: External ear normal.  Left Ear: External ear normal.  Cardiovascular: Normal rate, regular rhythm, normal heart sounds and intact distal pulses.  No murmur heard. Pulmonary/Chest: Effort normal and breath sounds normal. No respiratory distress. She has no wheezes. She has no rales. She exhibits no tenderness.  Neurological: She is alert and oriented to person, place, and time.  Skin: Skin is warm and dry. No rash noted. She is not diaphoretic. No erythema. No pallor.  Ruddy facial appearance   Psychiatric: She has a normal mood and affect. Her behavior is normal. Judgment and thought content normal.  Nursing note and vitals reviewed.         Assessment & Plan:   No diagnosis found.  No problem-specific Assessment & Plan notes found for this encounter.  >45 mins spent verifying history and coordinating care.   FOLLOW-UP:  No Follow-up on file.

## 2017-04-16 ENCOUNTER — Other Ambulatory Visit (HOSPITAL_COMMUNITY): Payer: Self-pay | Admitting: Neurological Surgery

## 2017-04-16 ENCOUNTER — Other Ambulatory Visit: Payer: Self-pay | Admitting: Adult Health

## 2017-04-16 DIAGNOSIS — C9000 Multiple myeloma not having achieved remission: Secondary | ICD-10-CM

## 2017-04-16 DIAGNOSIS — M519 Unspecified thoracic, thoracolumbar and lumbosacral intervertebral disc disorder: Secondary | ICD-10-CM

## 2017-04-16 DIAGNOSIS — M799 Soft tissue disorder, unspecified: Secondary | ICD-10-CM

## 2017-04-16 DIAGNOSIS — M7989 Other specified soft tissue disorders: Secondary | ICD-10-CM

## 2017-04-16 MED FILL — OXYCODON-ACETAMINOPHEN 7.5-: 7.5-325 | 5 days supply | Qty: 60 | Fill #0

## 2017-04-16 NOTE — Progress Notes (Signed)
Spoke with Neurosurgeon- Dr. Cyndy Freeze with [facility] and Ms. Jemison's MRI shows, Multiple Myeloma.   He will refer her to radiology to have bone marrow bx I placed referral to oncology/hematology  Dr. Cyndy Freeze has OV with pt now and will explain imaging results and plan thus far.

## 2017-04-19 ENCOUNTER — Telehealth: Payer: Self-pay | Admitting: Adult Health

## 2017-04-19 ENCOUNTER — Other Ambulatory Visit: Payer: Self-pay | Admitting: Adult Health

## 2017-04-19 DIAGNOSIS — M5431 Sciatica, right side: Secondary | ICD-10-CM

## 2017-04-19 NOTE — Telephone Encounter (Signed)
Patient is complaining about having high blood pressure, anxiety, and insomnia since the news of cancer Friday and wants to speak with someone clinical about what she can do. She also was questioning the process of what is happening next as far as getting set up with oncology, I told her I have routed the referral to the [facility] and if she had not heard anything by the end of the week to call our office and I would call on her behalf about referral.

## 2017-04-19 NOTE — Telephone Encounter (Signed)
Good Morning Tonya, Can you please call Ms. Forget and share: Dr. Ditty/Neurosurgeon placed the order for BX and it is scheduled for 04/26/17 I placed the referral to Oncology/Hematology 04/16/17 and she should hear from them to schedule an appt.- again if she does not hear from [facility] by end of week to call our clinic. Onc/Hemat will be THE absolute best provider to speak with about dx, treatment plan, and prognosis. It is not unusual to experience anxiety r/t new ca dx and I can place a mental health referral if she likes. Please ask her what her BP is running, we have only seen her once in clinic (she established as a new pt two weeks   ago) and her BP was 122/81 and she is not on BP meds. As far as the insomnia, please practice sleep hygiene and try guided imagery/relaxation techniques. Thanks! Valetta Fuller

## 2017-04-19 NOTE — Telephone Encounter (Signed)
Pt states that BP has been running 181/90-191/99.  Advised pt needs OV to evaluate and possible tx.  Pt expressed understanding and is agreeable.  Pt transferred to front desk to schedule.  Will also discuss with pt further re: insomnia and anxiety.  Charyl Bigger, CMA

## 2017-04-19 NOTE — Telephone Encounter (Signed)
Please advise.  T. Nelson, CMA 

## 2017-04-21 ENCOUNTER — Telehealth: Payer: Self-pay | Admitting: Adult Health

## 2017-04-21 ENCOUNTER — Ambulatory Visit: Payer: BLUE CROSS/BLUE SHIELD | Admitting: Adult Health

## 2017-04-21 NOTE — Telephone Encounter (Signed)
Noted.  Mina Marble, NP aware.  Charyl Bigger, CMA

## 2017-04-21 NOTE — Telephone Encounter (Signed)
Spoke with patient and her concerns about wanting to be followed by "legitmate doctor and not just a nurse". Spoke with the clinic staff and based on Dr. Daisy Floro availability would not be able to get patient seen within her "urgent timeframe". Patient expressed understanding and said she will be looking for another PCP office that has a larger amount of MDs that could accomodate her needs sooner than we could. I told the patient once she is set up with new office to call me back so that I could send all of our records to them ASAP to expedite transfer of medical records for her, she expressed understanding of this. Finally, I assured her that I would continue with the pain and oncology referrals to get her set up with these specialists in the mean time.

## 2017-04-21 NOTE — Progress Notes (Deleted)
Subjective:    Patient ID: [name], female    DOB: 10/03/1955, 62 y.o.   MRN: [id number]  HPI: 03/31/17 OV: [name] is here to establish as a new pt.  She is a pleasant 62 year old female.  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  Resumed heavy drinking at age 27 (2-3 bottles/day) and quit again 2014 after a suicide attempt 2014.   CBT 2014-2015 Started using tobacco again 2014, now smoking pack/day Nov 2017- severe abdominal pain and ultimately dx'd with chronic pancreatis with calcification.   April 2018, July 20180- underwent lithotripsy at [facility]/GI specialist, next f/u Feb 2019 Chronic pancreatic pain that has caused her to seek pain control at previous PCP and multiple UCs/EDs. She reports being "on waiting list for [facility] for > 1 year". She denies regular ETOH [place], however did admit to drinking "a few drinks the other day b/c my back hurt so much". Seen in ED 03/30/17, re: lumbar back pain with sciatica.  Provided narcotics and referral to [facility] for  Sciatica, she is awaiting call to make initial appt. She denies current sx's of depression or thoughts of harming herself or others.  She reports a strong support system of her boyfriend, friends, and her son is in [place].   04/21/17 OV: [name] is here for elevated BP, insomnia, and anxiety/stress r/t to recent dx of multiple myeloma  Patient Care Team    Relationship Specialty Notifications Start End  Esaw Grandchild, NP PCP - General Family Medicine  04/01/17   Michael Boston, MD Consulting Physician General Surgery  05/25/16     Patient Active Problem List   Diagnosis Date Noted  . Pancreatic pain 03/31/2017  . Sciatica of right side 02/25/2017  . Tobacco use disorder 02/25/2017  . Incisional hernia s/p lap/open repair with mesh 07/09/2016 07/09/2016  .  Chronic pancreatitis ([place]) 04/18/2016  . Pancreatic calcification 04/03/2016  . Abdominal pain, right upper quadrant 04/03/2016  . Healthcare maintenance 04/03/2016  . Major depressive disorder, recurrent, severe without psychotic features ([place])   . Substance induced mood disorder ([place]) 11/06/2014  . Bilateral knee pain 10/14/2014  . Alcohol use disorder, severe, dependence ([place]) 08/17/2014  . Suicidal behavior   . Alcohol dependence ([place]) 10/02/2013  . Major depression, recurrent ([place]) 10/02/2013  . Gastric ulcer 01/20/2012  . Fatty liver 10/11/2011  . 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 Elevated liver function tests   . GERD (gastroesophageal reflux disease)   . ESOPHAGEAL MOTILITY DISORDER 10/01/2008  . HIATAL HERNIA 10/01/2008  . FATTY LIVER DISEASE 10/01/2008     Past Medical History:  Diagnosis Date  . Alcohol abuse    H/o withdrawal   . Alcohol withdrawal ([place]) 10/02/2013  . Anxiety   . Chronic diarrhea    hx of  . Constipation   . Depression   . Elevated liver function tests few yrs ago  . Pancreatitis   . PONV (postoperative nausea and vomiting)   . Rosacea   . Wears glasses      Past Surgical History:  Procedure Laterality Date  . CHOLECYSTECTOMY    . COLONOSCOPY  Never  . DIAGNOSTIC MAMMOGRAM  2008  . ERCP  10/06/2011   Procedure: ENDOSCOPIC RETROGRADE CHOLANGIOPANCREATOGRAPHY (ERCP);  Surgeon: Beryle Beams, MD;  Location: [facility];  Service: Endoscopy;  Laterality: N/A;  . ESOPHAGOGASTRODUODENOSCOPY  03/21/2011  Procedure: ESOPHAGOGASTRODUODENOSCOPY (EGD);  Surgeon: Scarlette Shorts, MD;  Location[facility]COPY;  Service: Endoscopy;  Laterality: N/A;  Patient may need to be done at bedside tomorrow depending on status  . ESOPHAGOGASTRODUODENOSCOPY  01/06/2012   Procedure: ESOPHAGOGASTRODUODENOSCOPY (EGD);  Surgeon: Wonda Horner, MD;  Location: [facility];  Service: Endoscopy;  Laterality: N/A;  bedside  . HERNIA REPAIR    . INSERTION OF MESH  07/09/2016    Procedure: INSERTION OF MESH;  Surgeon: Michael Boston, MD;  Location: [facility];  Service: General;;  . LAPAROTOMY  01/03/2012   Procedure: EXPLORATORY LAPAROTOMY;  Surgeon: Pedro Earls, MD;  Location: [facility];  Service: General;  Laterality: N/A;  debridement of necrotic pancreas and placement of drains x2  . pancreatic cyst removed   2015  . SHOULDER SURGERY Left 02/18/2010   clavical repair  . TUBAL LIGATION    . VENTRAL HERNIA REPAIR N/A 07/09/2016   Procedure: LAPAROSCOPIC LYSIS OF ADHESIONS  VENTRAL WALL HERNIA REPAIR WITH MESH EXCISION OF HERNIA [place];  Surgeon: Michael Boston, MD;  Location: [facility];  Service: General;  Laterality: N/A;     Family History  Problem Relation Age of Onset  . Stroke Mother   . Heart disease Father   . Heart failure Father   . Heart disease Sister      Social History   Substance and Sexual Activity  Drug Use Yes  . Types: Marijuana     Social History   Substance and Sexual Activity  Alcohol Use No  . Alcohol/week: 1.5 - 2.0 oz  . Types: 3 - 4 Standard drinks or equivalent per week   Comment: quit 2016     Social History   Tobacco Use  Smoking Status Light Tobacco Smoker  . Packs/day: 0.25  . Years: 15.00  . Pack years: 3.75  . Types: Cigarettes  Smokeless Tobacco Never Used  Tobacco Comment   trying nicotine patch     Outpatient Encounter Medications as of 04/21/2017  Medication Sig  . Calcium Carbonate-Vitamin D (CALCIUM 600+D PO) Take 1 tablet by mouth daily.  Marland Kitchen docusate sodium (COLACE) 250 MG capsule Take 1 capsule (250 mg total) by mouth daily. While taking pain medication  . HYDROcodone-acetaminophen (NORCO/VICODIN) 5-325 MG tablet Take 1 tablet by mouth every 6 (six) hours as needed.  . meloxicam (MOBIC) 7.5 MG tablet Take 1 tablet (7.5 mg total) by mouth daily.  . methylPREDNISolone (MEDROL DOSEPAK) 4 MG TBPK tablet Take as directed on package  . Multiple Vitamin (MULTIVITAMIN WITH MINERALS) TABS tablet Take 1 tablet by  mouth daily.  . nicotine (NICODERM CQ) 14 mg/24hr patch Place 1 patch (14 mg total) onto the skin daily.  . ondansetron (ZOFRAN ODT) 4 MG disintegrating tablet Take 1 tablet (4 mg total) by mouth every 8 (eight) hours as needed for nausea or vomiting.  Marland Kitchen oxyCODONE-acetaminophen (PERCOCET/ROXICET) 5-325 MG tablet Take 1-2 tablets by mouth every 4 (four) hours as needed for moderate pain or severe pain.  . valACYclovir (VALTREX) 1000 MG tablet Take 1,000 mg by mouth daily as needed (fever blisters).    No facility-administered encounter medications on file as of 04/21/2017.     Allergies: Ciprofloxacin  There is no height or weight on file to calculate BMI.  Last menstrual period 12/08/2006.    Review of Systems  Constitutional: Positive for fatigue. Negative for activity change, appetite change, chills, diaphoresis, fever and unexpected weight change.  HENT: Negative for congestion.   Eyes: Negative for visual  disturbance.  Respiratory: Positive for cough. Negative for chest tightness, shortness of breath, wheezing and stridor.   Cardiovascular: Negative for chest pain, palpitations and leg swelling.  Gastrointestinal: Positive for abdominal pain and nausea. Negative for abdominal distention, blood in stool, constipation, diarrhea and vomiting.  Endocrine: Negative for cold intolerance, heat intolerance, polydipsia, polyphagia and polyuria.  Genitourinary: Negative for difficulty urinating, flank pain and hematuria.  Musculoskeletal: Positive for arthralgias, back pain and myalgias. Negative for gait problem, joint swelling, neck pain and neck stiffness.  Skin: Negative for color change, pallor, rash and wound.  Neurological: Negative for dizziness, syncope, weakness and headaches.  Hematological: Does not bruise/bleed easily.  Psychiatric/Behavioral: Positive for sleep disturbance. Negative for decreased concentration, dysphoric mood, hallucinations, self-injury and suicidal ideas. The  patient is not nervous/anxious and is not hyperactive.        Objective:   Physical Exam  Constitutional: She is oriented to person, place, and time. She appears well-developed and well-nourished. No distress.  HENT:  Head: Normocephalic and atraumatic.  Right Ear: External ear normal.  Left Ear: External ear normal.  Cardiovascular: Normal rate, regular rhythm, normal heart sounds and intact distal pulses.  No murmur heard. Pulmonary/Chest: Effort normal and breath sounds normal. No respiratory distress. She has no wheezes. She has no rales. She exhibits no tenderness.  Neurological: She is alert and oriented to person, place, and time.  Skin: Skin is warm and dry. No rash noted. She is not diaphoretic. No erythema. No pallor.  Ruddy facial appearance   Psychiatric: She has a normal mood and affect. Her behavior is normal. Judgment and thought content normal.  Nursing note and vitals reviewed.         Assessment & Plan:   No diagnosis found.  No problem-specific Assessment & Plan notes found for this encounter.  >45 mins spent verifying history and coordinating care.   FOLLOW-UP:  No Follow-up on file.

## 2017-04-22 MED FILL — oxyCODONE HCL 5 MG TABS: 5 | 5 days supply | Qty: 30 | Fill #0

## 2017-04-26 ENCOUNTER — Encounter (HOSPITAL_COMMUNITY): Payer: Self-pay

## 2017-04-26 ENCOUNTER — Ambulatory Visit (HOSPITAL_COMMUNITY)
Admission: RE | Admit: 2017-04-26 | Discharge: 2017-04-26 | Disposition: A | Discharge status: Home / Self Care | Payer: BLUE CROSS/BLUE SHIELD | Source: Ambulatory Visit | Attending: Neurological Surgery | Admitting: Neurological Surgery

## 2017-04-26 DIAGNOSIS — M7989 Other specified soft tissue disorders: Secondary | ICD-10-CM | POA: Insufficient documentation

## 2017-04-26 DIAGNOSIS — M519 Unspecified thoracic, thoracolumbar and lumbosacral intervertebral disc disorder: Secondary | ICD-10-CM | POA: Diagnosis present

## 2017-04-26 DIAGNOSIS — C7951 Secondary malignant neoplasm of bone: Secondary | ICD-10-CM | POA: Diagnosis not present

## 2017-04-26 DIAGNOSIS — M799 Soft tissue disorder, unspecified: Secondary | ICD-10-CM

## 2017-04-26 LAB — PROTIME-INR
INR: 0.96
Prothrombin Time: 12.7 seconds (ref 11.4–15.2)

## 2017-04-26 LAB — CBC
HEMATOCRIT: 37.6 % (ref 36.0–46.0)
Hemoglobin: 12.1 g/dL (ref 12.0–15.0)
MCH: 29.7 pg (ref 26.0–34.0)
MCHC: 32.2 g/dL (ref 30.0–36.0)
MCV: 92.4 fL (ref 78.0–100.0)
Platelets: 450 10*3/uL — ABNORMAL HIGH (ref 150–400)
RBC: 4.07 MIL/uL (ref 3.87–5.11)
RDW: 12.9 % (ref 11.5–15.5)
WBC: 12.5 10*3/uL — ABNORMAL HIGH (ref 4.0–10.5)

## 2017-04-26 LAB — APTT: aPTT: 29 seconds (ref 24–36)

## 2017-04-26 MED ORDER — OXYCODONE-ACETAMINOPHEN 5-325 MG PO TABS
2.0000 | ORAL_TABLET | Freq: Once | ORAL | Status: AC
  Administered 2017-04-26: 2 via ORAL

## 2017-04-26 MED ORDER — FENTANYL CITRATE (PF) 100 MCG/2ML IJ SOLN
INTRAMUSCULAR | Status: AC | PRN
  Administered 2017-04-26 (×5): 25 ug via INTRAVENOUS
  Administered 2017-04-26: 50 ug via INTRAVENOUS

## 2017-04-26 MED ORDER — MIDAZOLAM HCL 2 MG/2ML IJ SOLN
INTRAMUSCULAR | Status: AC
  Filled 2017-04-26: qty 4

## 2017-04-26 MED ORDER — LIDOCAINE HCL 1 % IJ SOLN
INTRAMUSCULAR | Status: AC
  Filled 2017-04-26: qty 20

## 2017-04-26 MED ORDER — OXYCODONE-ACETAMINOPHEN 5-325 MG PO TABS
ORAL_TABLET | ORAL | Status: AC
  Filled 2017-04-26: qty 1

## 2017-04-26 MED ORDER — MIDAZOLAM HCL 2 MG/2ML IJ SOLN
INTRAMUSCULAR | Status: AC | PRN
  Administered 2017-04-26: 1 mg via INTRAVENOUS
  Administered 2017-04-26 (×3): 0.5 mg via INTRAVENOUS
  Administered 2017-04-26: 1 mg via INTRAVENOUS
  Administered 2017-04-26: 0.5 mg via INTRAVENOUS

## 2017-04-26 MED ORDER — SODIUM CHLORIDE 0.9 % IV SOLN: INTRAVENOUS | Status: DC

## 2017-04-26 MED ORDER — OXYCODONE-ACETAMINOPHEN 5-325 MG PO TABS
ORAL_TABLET | ORAL | Status: AC
  Administered 2017-04-26: 2 via ORAL
  Filled 2017-04-26: qty 1

## 2017-04-26 MED ORDER — SODIUM CHLORIDE 0.9 % IV SOLN
INTRAVENOUS | Status: AC | PRN
  Administered 2017-04-26: 10 mL/h via INTRAVENOUS

## 2017-04-26 MED ORDER — FENTANYL CITRATE (PF) 100 MCG/2ML IJ SOLN
INTRAMUSCULAR | Status: AC
  Filled 2017-04-26: qty 4

## 2017-04-26 NOTE — Sedation Documentation (Signed)
Called to give report. Nurse unavailable. Will call back 

## 2017-04-26 NOTE — Discharge Instructions (Addendum)
Needle Biopsy of the Bone, Care After Refer to this sheet in the next few weeks. These instructions provide you with information about caring for yourself after your procedure. Your health care provider may also give you more specific instructions. Your treatment has been planned according to current medical practices, but problems sometimes occur. Call your health care provider if you have any problems or questions after your procedure. What can I expect after the procedure? After your procedure, it is common to have soreness or tenderness at the puncture site. Follow these instructions at home:  Take over-the-counter and prescription medicines only as told by your health care provider.  Bathe and shower as told by your health care provider.  Follow instructions from your health care provider about: ? How to take care of your puncture site. ? When and how you should change your bandage (dressing). ? When you should remove your dressing.  Check your puncture site every day for signs of infection. Watch for: ? Redness, swelling, or worsening pain. ? Fluid, blood, or pus.  Return to your normal activities as told by your health care provider.  Keep all follow-up visits as told by your health care provider. This is important. Contact a health care provider if:  You have redness, swelling, or worsening pain at the site of your puncture.  You have fluid, blood, or pus coming from your puncture site.  You have a fever.  You have persistent nausea or vomiting. Get help right away if:  You develop a rash.  You have difficulty breathing. This information is not intended to replace advice given to you by your health care provider. Make sure you discuss any questions you have with your health care provider. Document Released: 10/24/2004 Document Revised: 09/12/2015 Document Reviewed: 05/14/2014 Elsevier Interactive Patient Education  2018 [place]. Moderate Conscious Sedation,  Adult, Care After These instructions provide you with information about caring for yourself after your procedure. Your health care provider may also give you more specific instructions. Your treatment has been planned according to current medical practices, but problems sometimes occur. Call your health care provider if you have any problems or questions after your procedure. What can I expect after the procedure? After your procedure, it is common:  To feel sleepy for several hours.  To feel clumsy and have poor balance for several hours.  To have poor judgment for several hours.  To vomit if you eat too soon.  Follow these instructions at home: For at least 24 hours after the procedure:   Do not: ? Participate in activities where you could fall or become injured. ? Drive. ? Use heavy machinery. ? Drink alcohol. ? Take sleeping pills or medicines that cause drowsiness. ? Make important decisions or sign legal documents. ? Take care of children on your own.  Rest. Eating and drinking  Follow the diet recommended by your health care provider.  If you vomit: ? Drink water, juice, or soup when you can drink without vomiting. ? Make sure you have little or no nausea before eating solid foods. General instructions  Have a responsible adult stay with you until you are awake and alert.  Take over-the-counter and prescription medicines only as told by your health care provider.  If you smoke, do not smoke without supervision.  Keep all follow-up visits as told by your health care provider. This is important. Contact a health care provider if:  You keep feeling nauseous or you keep vomiting.  You feel light-headed.  You develop a rash.  You have a fever. Get help right away if:  You have trouble breathing. This information is not intended to replace advice given to you by your health care provider. Make sure you discuss any questions you have with your health care  provider. Document Released: 01/25/2013 Document Revised: 09/09/2015 Document Reviewed: 07/27/2015 Elsevier Interactive Patient Education  [employer].

## 2017-04-26 NOTE — H&P (Signed)
Chief Complaint: Patient was seen in consultation today for right iliac bone lesion biopsy at the request of Ditty,Benjamin Jared  Referring Physician(s): Ditty,Benjamin Kevan Ny  Supervising Physician: Marybelle Killings  Patient Status: [name] - Out-pt  History of Present Illness: [name] is a 62 y.o. female   Back and right buttock pain for 3-4 months Persistent and worsened Radiation to right leg Sought medical evaluation 03/2017  MRI revealed bony lesions Request made for biopsy Concerning for multiple myeloma   Scheduled now for right iliac bone lesion biopsy  Past Medical History:  Diagnosis Date  . Alcohol abuse    H/o withdrawal   . Alcohol withdrawal ([place]) 10/02/2013  . Anxiety   . Chronic diarrhea    hx of  . Constipation   . Depression   . Elevated liver function tests few yrs ago  . Pancreatitis   . PONV (postoperative nausea and vomiting)   . Rosacea   . Wears glasses     Past Surgical History:  Procedure Laterality Date  . CHOLECYSTECTOMY    . COLONOSCOPY  Never  . DIAGNOSTIC MAMMOGRAM  2008  . ERCP  10/06/2011   Procedure: ENDOSCOPIC RETROGRADE CHOLANGIOPANCREATOGRAPHY (ERCP);  Surgeon: Beryle Beams, MD;  Location: [facility];  Service: Endoscopy;  Laterality: N/A;  . ESOPHAGOGASTRODUODENOSCOPY  03/21/2011   Procedure: ESOPHAGOGASTRODUODENOSCOPY (EGD);  Surgeon: Scarlette Shorts, MD;  Location[name] END[facility]COPY;  Service: Endoscopy;  Laterality: N/A;  Patient may need to be done at bedside tomorrow depending on status  . ESOPHAGOGASTRODUODENOSCOPY  01/06/2012   Procedure: ESOPHAGOGASTRODUODENOSCOPY (EGD);  Surgeon: Wonda Horner, MD;  Location: [facility];  Service: Endoscopy;  Laterality: N/A;  bedside  . HERNIA REPAIR    . INSERTION OF MESH  07/09/2016   Procedure: INSERTION OF MESH;  Surgeon: Michael Boston, MD;  Location: [facility];  Service: General;;  . LAPAROTOMY  01/03/2012   Procedure: EXPLORATORY LAPAROTOMY;  Surgeon: Pedro Earls, MD;   Location: [facility];  Service: General;  Laterality: N/A;  debridement of necrotic pancreas and placement of drains x2  . pancreatic cyst removed   2015  . SHOULDER SURGERY Left 02/18/2010   clavical repair  . TUBAL LIGATION    . VENTRAL HERNIA REPAIR N/A 07/09/2016   Procedure: LAPAROSCOPIC LYSIS OF ADHESIONS  VENTRAL WALL HERNIA REPAIR WITH MESH EXCISION OF HERNIA [place];  Surgeon: Michael Boston, MD;  Location: [facility];  Service: General;  Laterality: N/A;    Allergies: Ciprofloxacin  Medications: Prior to Admission medications   Medication Sig Start Date End Date Taking? Authorizing Provider  Calcium Carbonate-Vitamin D (CALCIUM 600+D PO) Take 1 tablet by mouth daily.   Yes [provider]  docusate sodium (COLACE) 250 MG capsule Take 1 capsule (250 mg total) by mouth daily. While taking pain medication 03/30/17  Yes Duffy Bruce, MD  HYDROcodone-acetaminophen (NORCO/VICODIN) 5-325 MG tablet Take 1 tablet by mouth every 6 (six) hours as needed. 04/06/17  Yes Lorin Glass, PA-C  meloxicam (MOBIC) 7.5 MG tablet Take 1 tablet (7.5 mg total) by mouth daily. 04/06/17  Yes Lorin Glass, PA-C  Multiple Vitamin (MULTIVITAMIN WITH MINERALS) TABS tablet Take 1 tablet by mouth daily.   Yes [provider]  ondansetron (ZOFRAN ODT) 4 MG disintegrating tablet Take 1 tablet (4 mg total) by mouth every 8 (eight) hours as needed for nausea or vomiting. 03/30/17  Yes Duffy Bruce, MD  oxyCODONE-acetaminophen (PERCOCET/ROXICET) 5-325 MG tablet Take 1-2 tablets by mouth every 4 (four) hours as  needed for moderate pain or severe pain. 03/30/17  Yes Duffy Bruce, MD  methylPREDNISolone (MEDROL DOSEPAK) 4 MG TBPK tablet Take as directed on package 03/30/17   Duffy Bruce, MD  nicotine (NICODERM CQ) 14 mg/24hr patch Place 1 patch (14 mg total) onto the skin daily. 03/31/17   Danford, Valetta Fuller D, NP  valACYclovir (VALTREX) 1000 MG tablet Take 1,000 mg by mouth daily as needed  (fever blisters).  04/10/15   [provider]     Family History  Problem Relation Age of Onset  . Stroke Mother   . Heart disease Father   . Heart failure Father   . Heart disease Sister     Social History   Socioeconomic History  . Marital status: Divorced    Spouse name: None  . Number of children: None  . Years of education: None  . Highest education level: None  Social Needs  . Financial resource strain: None  . Food insecurity - worry: None  . Food insecurity - inability: None  . Transportation needs - medical: None  . Transportation needs - non-medical: None  Occupational History  . None  Tobacco Use  . Smoking status: Light Tobacco Smoker    Packs/day: 0.25    Years: 15.00    Pack years: 3.75    Types: Cigarettes  . Smokeless tobacco: Never Used  . Tobacco comment: trying nicotine patch  Substance and Sexual Activity  . Alcohol use: No    Alcohol/week: 1.5 - 2.0 oz    Types: 3 - 4 Standard drinks or equivalent per week    Comment: quit 2016  . Drug use: Yes    Types: Marijuana  . 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  Other Topics Concern  . None  Social History Narrative   Has a son and some friends with whom she is close. Was in rehab in 09/2010 and was sober for a few months after.     Review of Systems: A 12 point ROS discussed and pertinent positives are indicated in the HPI above.  All other systems are negative.  Review of Systems  Constitutional: Positive for activity change, appetite change and fatigue.  Respiratory: Negative for cough and shortness of breath.   Cardiovascular: Negative for chest pain.  Gastrointestinal: Negative for abdominal pain.  Musculoskeletal: Positive for back pain and gait problem.  Neurological: Positive for weakness.  Psychiatric/Behavioral: Negative for behavioral problems and confusion.    Vital Signs: BP (!) 154/72   Pulse 80   Temp 98.3 F (36.8 C)  (Oral)   Resp 18   Ht '5\' 7"'$  (1.702 m)   Wt 156 lb (70.8 kg)   LMP 12/08/2006   SpO2 100%   BMI 24.43 kg/m   Physical Exam  Constitutional: She is oriented to person, place, and time.  Cardiovascular: Normal rate, regular rhythm and normal heart sounds.  Pulmonary/Chest: Effort normal and breath sounds normal.  Abdominal: Soft. Bowel sounds are normal.  Musculoskeletal: Normal range of motion. She exhibits tenderness.  Neurological: She is alert and oriented to person, place, and time.  Skin: Skin is warm and dry.  Psychiatric: She has a normal mood and affect. Her behavior is normal. Thought content normal.  Nursing note and vitals reviewed.   Imaging: Dg Lumbar Spine Complete  Result Date: 03/30/2017 CLINICAL DATA:  Low back pain EXAM: LUMBAR SPINE - COMPLETE 4+ VIEW COMPARISON:  CT 03/25/2016 FINDINGS: Degenerative disc and  facet disease in the lower lumbar spine with disc space narrowing and spurring. Normal alignment. No fracture. Atherosclerotic change in the scratched at atherosclerotic calcifications in the aorta. No visible aneurysm. Calcifications in the pancreatic head region as seen on prior CT. Prior cholecystectomy. Plastic biliary stent noted in place. IMPRESSION: Degenerative disc and facet disease in the lower lumbar spine. No acute bony abnormality. Electronically Signed   By: Rolm Baptise M.D.   On: 03/30/2017 11:29    Labs:  CBC: Recent Labs    07/08/16 1438 11/16/16 1424 12/25/16 0729 03/30/17 1015  WBC 10.8* 11.3* 11.8* 14.1*  HGB 14.2 15.2* 13.4 12.1  HCT 43.2 44.1 40.7 36.5  PLT 328 333 317 411*    COAGS: No results for input(s): INR, APTT in the last 8760 hours.  BMP: Recent Labs    07/08/16 1438 11/16/16 1424 12/25/16 0729 03/30/17 1015  NA 141 138 138 135  K 4.0 3.6 4.1 3.9  CL 106 104 105 103  CO2 '27 27 24 24  '$ GLUCOSE 139* 128* 97 101*  BUN 10 5* 12 10  CALCIUM 9.8 9.8 9.2 9.0  CREATININE 0.77 0.73 0.75 0.72  GFRNONAA >60 >60 >60  >60  GFRAA >60 >60 >60 >60    LIVER FUNCTION TESTS: Recent Labs    07/08/16 1438 11/16/16 1424 12/25/16 0729 03/30/17 1015  BILITOT 0.5 0.4 0.6 0.5  AST '20 19 19 17  '$ ALT 14 14 13* 12*  ALKPHOS 69 69 59 117  PROT 7.4 8.3* 6.8 7.0  ALBUMIN 4.3 4.3 3.7 3.3*    TUMOR MARKERS: No results for input(s): AFPTM, CEA, CA199, CHROMGRNA in the last 8760 hours.  Assessment and Plan:  Neck and back pain - radiation to Rt leg for few months Bony lesions per MRI Scheduled for right iliac bone lesion biopsy Risks and benefits discussed with the patient including, but not limited to bleeding, infection, damage to adjacent structures or low yield requiring additional tests. All of the patient's questions were answered, patient is agreeable to proceed. Consent signed and in chart.  Thank you for this interesting consult.  I greatly enjoyed meeting Alayiah Fontes and look forward to participating in their care.  A copy of this report was sent to the requesting provider on this date.  Electronically Signed: Lavonia Drafts, PA-C 04/26/2017, 10:28 AM   I spent a total of  30 Minutes   in face to face in clinical consultation, greater than 50% of which was counseling/coordinating care for right iliac bone lesion bx

## 2017-04-26 NOTE — Procedures (Signed)
R iliac Bone core Bx 11 g times one EBL 0 Comp 0

## 2017-04-27 MED FILL — OXYCODON-ACETAMINOPHEN 7.5-: 7.5-325 | 30 days supply | Qty: 120 | Fill #0

## 2017-04-28 ENCOUNTER — Emergency Department (HOSPITAL_COMMUNITY)
Admission: EM | Admit: 2017-04-28 | Discharge: 2017-04-28 | Disposition: A | Discharge status: Home / Self Care | Payer: BLUE CROSS/BLUE SHIELD | Attending: Emergency Medicine | Admitting: Emergency Medicine

## 2017-04-28 ENCOUNTER — Encounter (HOSPITAL_COMMUNITY): Payer: Self-pay | Admitting: *Deleted

## 2017-04-28 DIAGNOSIS — Z5321 Procedure and treatment not carried out due to patient leaving prior to being seen by health care provider: Secondary | ICD-10-CM | POA: Diagnosis not present

## 2017-04-28 DIAGNOSIS — M549 Dorsalgia, unspecified: Secondary | ICD-10-CM | POA: Diagnosis present

## 2017-04-28 NOTE — ED Triage Notes (Signed)
Pt w/ multiple myeloma complains of back pain that is uncontrolled by home percocet.

## 2017-04-28 NOTE — ED Notes (Signed)
Pt states she is in too much pain to wait, she will return in the morning

## 2017-04-28 NOTE — ED Notes (Signed)
Patient left without being seen per registration

## 2017-05-06 ENCOUNTER — Other Ambulatory Visit: Payer: Self-pay

## 2017-05-06 ENCOUNTER — Emergency Department (HOSPITAL_COMMUNITY)
Admission: EM | Admit: 2017-05-06 | Discharge: 2017-05-06 | Disposition: A | Discharge status: Home / Self Care | Payer: BLUE CROSS/BLUE SHIELD | Attending: Emergency Medicine | Admitting: Emergency Medicine

## 2017-05-06 ENCOUNTER — Encounter (HOSPITAL_COMMUNITY): Payer: Self-pay | Admitting: Emergency Medicine

## 2017-05-06 DIAGNOSIS — M545 Low back pain, unspecified: Secondary | ICD-10-CM

## 2017-05-06 DIAGNOSIS — Z79899 Other long term (current) drug therapy: Secondary | ICD-10-CM | POA: Diagnosis not present

## 2017-05-06 DIAGNOSIS — F17210 Nicotine dependence, cigarettes, uncomplicated: Secondary | ICD-10-CM | POA: Insufficient documentation

## 2017-05-06 DIAGNOSIS — M6281 Muscle weakness (generalized): Secondary | ICD-10-CM | POA: Diagnosis not present

## 2017-05-06 DIAGNOSIS — R509 Fever, unspecified: Secondary | ICD-10-CM | POA: Diagnosis not present

## 2017-05-06 LAB — CBC WITH DIFFERENTIAL/PLATELET
Basophils Absolute: 0.1 10*3/uL (ref 0.0–0.1)
Basophils Relative: 0 %
EOS ABS: 0.1 10*3/uL (ref 0.0–0.7)
EOS PCT: 1 %
HCT: 33.4 % — ABNORMAL LOW (ref 36.0–46.0)
Hemoglobin: 11.1 g/dL — ABNORMAL LOW (ref 12.0–15.0)
Lymphocytes Relative: 16 %
Lymphs Abs: 2.9 10*3/uL (ref 0.7–4.0)
MCH: 29.8 pg (ref 26.0–34.0)
MCHC: 33.2 g/dL (ref 30.0–36.0)
MCV: 89.8 fL (ref 78.0–100.0)
MONO ABS: 1.7 10*3/uL — AB (ref 0.1–1.0)
MONOS PCT: 10 %
Neutro Abs: 12.8 10*3/uL — ABNORMAL HIGH (ref 1.7–7.7)
Neutrophils Relative %: 73 %
PLATELETS: 401 10*3/uL — AB (ref 150–400)
RBC: 3.72 MIL/uL — ABNORMAL LOW (ref 3.87–5.11)
RDW: 13.1 % (ref 11.5–15.5)
WBC: 17.6 10*3/uL — ABNORMAL HIGH (ref 4.0–10.5)

## 2017-05-06 LAB — COMPREHENSIVE METABOLIC PANEL
ALT: 10 U/L — ABNORMAL LOW (ref 14–54)
ANION GAP: 8 (ref 5–15)
AST: 15 U/L (ref 15–41)
Albumin: 3.1 g/dL — ABNORMAL LOW (ref 3.5–5.0)
Alkaline Phosphatase: 173 U/L — ABNORMAL HIGH (ref 38–126)
BUN: 10 mg/dL (ref 6–20)
CALCIUM: 9.1 mg/dL (ref 8.9–10.3)
CHLORIDE: 102 mmol/L (ref 101–111)
CO2: 24 mmol/L (ref 22–32)
Creatinine, Ser: 0.55 mg/dL (ref 0.44–1.00)
GFR calc Af Amer: >60 mL/min (ref >60)
Glucose, Bld: 96 mg/dL (ref 65–99)
Potassium: 4.4 mmol/L (ref 3.5–5.1)
SODIUM: 134 mmol/L — AB (ref 135–145)
Total Bilirubin: 0.4 mg/dL (ref 0.3–1.2)
Total Protein: 7.7 g/dL (ref 6.5–8.1)

## 2017-05-06 MED ORDER — ONDANSETRON HCL 4 MG/2ML IJ SOLN
4.0000 mg | Freq: Once | INTRAMUSCULAR | Status: DC
  Filled 2017-05-06: qty 2

## 2017-05-06 MED ORDER — LORAZEPAM 1 MG PO TABS: 1.0000 mg | ORAL_TABLET | Freq: Three times a day (TID) | ORAL | 0 refills | Status: DC | PRN

## 2017-05-06 MED ORDER — FENTANYL 25 MCG/HR TD PT72
75.0000 ug | MEDICATED_PATCH | TRANSDERMAL | Status: DC
  Administered 2017-05-06: 75 ug via TRANSDERMAL
  Filled 2017-05-06: qty 3

## 2017-05-06 MED ORDER — HYDROMORPHONE HCL 1 MG/ML IJ SOLN
1.0000 mg | Freq: Once | INTRAMUSCULAR | Status: AC
  Administered 2017-05-06: 1 mg via INTRAMUSCULAR
  Filled 2017-05-06: qty 1

## 2017-05-06 MED ORDER — OXYCODONE-ACETAMINOPHEN 7.5-325 MG PO TABS: 1.0000 | ORAL_TABLET | ORAL | 0 refills | Status: DC | PRN

## 2017-05-06 MED ORDER — LORAZEPAM 1 MG PO TABS
1.0000 mg | ORAL_TABLET | Freq: Once | ORAL | Status: AC
  Administered 2017-05-06: 1 mg via ORAL
  Filled 2017-05-06: qty 1

## 2017-05-06 MED FILL — LORazepam 1 MG TABS: 1 | 5 days supply | Qty: 15 | Fill #0

## 2017-05-06 NOTE — Discharge Instructions (Signed)
See the Oncologist as scheduled.  Take medications as directed

## 2017-05-06 NOTE — ED Provider Notes (Signed)
[facility] DEPT Provider Note   CSN: [id number] Arrival date & time: 05/06/17  2355     History   Chief Complaint Chief Complaint  Patient presents with  . Back Pain    HPI [name] is a 61 y.o. female.  The history is provided by the patient. No language interpreter was used.  Back Pain   This is a new problem. The problem occurs constantly. The problem has been gradually worsening. The pain is present in the lumbar spine. Associated symptoms include a fever and weakness. She has tried nothing for the symptoms. The treatment provided no relief.  Pt recently diagnosed with metastatic adenocarcinoma.  Pt has multiple areas in her low back.  Pt reports she could not get rx for pain medication filled.  Pt reports insurance would not cover fentanyl patch.  Pt has been taking oxycodone 7.5.   Past Medical History:  Diagnosis Date  . Alcohol abuse    H/o withdrawal   . Alcohol withdrawal ([place]) 10/02/2013  . Anxiety   . Chronic diarrhea    hx of  . Constipation   . Depression   . Elevated liver function tests few yrs ago  . Pancreatitis   . PONV (postoperative nausea and vomiting)   . Rosacea   . Wears glasses     Patient Active Problem List   Diagnosis Date Noted  . Pancreatic pain 03/31/2017  . Sciatica of right side 02/25/2017  . Tobacco use disorder 02/25/2017  . Incisional hernia s/p lap/open repair with mesh 07/09/2016 07/09/2016  . Chronic pancreatitis ([place]) 04/18/2016  . Pancreatic calcification 04/03/2016  . Abdominal pain, right upper quadrant 04/03/2016  . Healthcare maintenance 04/03/2016  . Major depressive disorder, recurrent, severe without psychotic features ([place])   . Substance induced mood disorder ([place]) 11/06/2014  . Bilateral knee pain 10/14/2014  . Alcohol use disorder, severe, dependence ([place]) 08/17/2014  . Suicidal behavior   . Alcohol dependence ([place]) 10/02/2013  . Major depression, recurrent ([place])  10/02/2013  . Gastric ulcer 01/20/2012  . Fatty liver 10/11/2011  . 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 Elevated liver function tests   . GERD (gastroesophageal reflux disease)   . ESOPHAGEAL MOTILITY DISORDER 10/01/2008  . HIATAL HERNIA 10/01/2008  . FATTY LIVER DISEASE 10/01/2008    Past Surgical History:  Procedure Laterality Date  . CHOLECYSTECTOMY    . COLONOSCOPY  Never  . DIAGNOSTIC MAMMOGRAM  2008  . ERCP  10/06/2011   Procedure: ENDOSCOPIC RETROGRADE CHOLANGIOPANCREATOGRAPHY (ERCP);  Surgeon: Beryle Beams, MD;  Location: [facility];  Service: Endoscopy;  Laterality: N/A;  . ESOPHAGOGASTRODUODENOSCOPY  03/21/2011   Procedure: ESOPHAGOGASTRODUODENOSCOPY (EGD);  Surgeon: Scarlette Shorts, MD;  Location: [facility] ENDOSCOPY;  Service: Endoscopy;  Laterality: N/A;  Patient may need to be done at bedside tomorrow depending on status  . ESOPHAGOGASTRODUODENOSCOPY  01/06/2012   Procedure: ESOPHAGOGASTRODUODENOSCOPY (EGD);  Surgeon: Wonda Horner, MD;  Location: [facility] ENDOSCOPY;  Service: Endoscopy;  Laterality: N/A;  bedside  . HERNIA REPAIR    . INSERTION OF MESH  07/09/2016   Procedure: INSERTION OF MESH;  Surgeon: Michael Boston, MD;  Location: [facility] ORS;  Service: General;;  . LAPAROTOMY  01/03/2012   Procedure: EXPLORATORY LAPAROTOMY;  Surgeon: Pedro Earls, MD;  Location: [facility]S;  Service: General;  Laterality: N/A;  debridement of necrotic pancreas and placement of drains x2  . pancreatic cyst removed   2015  . SHOULDER SURGERY Left 02/18/2010   clavical repair  .  TUBAL LIGATION    . VENTRAL HERNIA REPAIR N/A 07/09/2016   Procedure: LAPAROSCOPIC LYSIS OF ADHESIONS  VENTRAL WALL HERNIA REPAIR WITH MESH EXCISION OF HERNIA [place];  Surgeon: Michael Boston, MD;  Location: [facility];  Service: General;  Laterality: N/A;    OB History    No data available       Home Medications    Prior to Admission medications   Medication Sig Start Date End Date Taking? Authorizing Provider  Calcium  Carbonate-Vitamin D (CALCIUM 600+D PO) Take 1 tablet by mouth daily.   Yes [provider]  docusate sodium (COLACE) 250 MG capsule Take 1 capsule (250 mg total) by mouth daily. While taking pain medication 03/30/17  Yes Duffy Bruce, MD  Multiple Vitamin (MULTIVITAMIN WITH MINERALS) TABS tablet Take 1 tablet by mouth daily.   Yes [provider]  oxyCODONE-acetaminophen (PERCOCET) 7.5-325 MG tablet Take 2 tablets by mouth every 4 (four) hours as needed for severe pain.   Yes [provider]  valACYclovir (VALTREX) 1000 MG tablet Take 1,000 mg by mouth daily as needed (fever blisters).  04/10/15  Yes [provider]  HYDROcodone-acetaminophen (NORCO/VICODIN) 5-325 MG tablet Take 1 tablet by mouth every 6 (six) hours as needed. Patient not taking: Reported on 05/06/2017 04/06/17   Lorin Glass, PA-C  meloxicam (MOBIC) 7.5 MG tablet Take 1 tablet (7.5 mg total) by mouth daily. Patient not taking: Reported on 05/06/2017 04/06/17   Lorin Glass, PA-C  methylPREDNISolone (MEDROL DOSEPAK) 4 MG TBPK tablet Take as directed on package Patient not taking: Reported on 05/06/2017 03/30/17   Duffy Bruce, MD  nicotine (NICODERM CQ) 14 mg/24hr patch Place 1 patch (14 mg total) onto the skin daily. Patient not taking: Reported on 05/06/2017 03/31/17   Mina Marble D, NP  ondansetron (ZOFRAN ODT) 4 MG disintegrating tablet Take 1 tablet (4 mg total) by mouth every 8 (eight) hours as needed for nausea or vomiting. Patient not taking: Reported on 05/06/2017 03/30/17   Duffy Bruce, MD  oxyCODONE-acetaminophen (PERCOCET/ROXICET) 5-325 MG tablet Take 1-2 tablets by mouth every 4 (four) hours as needed for moderate pain or severe pain. Patient not taking: Reported on 05/06/2017 03/30/17   Duffy Bruce, MD    Family History Family History  Problem Relation Age of Onset  . Stroke Mother   . Heart disease Father   . Heart failure Father   . Heart disease  Sister     Social History Social History   Tobacco Use  . Smoking status: Light Tobacco Smoker    Packs/day: 0.25    Years: 15.00    Pack years: 3.75    Types: Cigarettes  . Smokeless tobacco: Never Used  . Tobacco comment: trying nicotine patch  Substance Use Topics  . Alcohol use: No    Alcohol/week: 1.5 - 2.0 oz    Types: 3 - 4 Standard drinks or equivalent per week    Comment: quit 2016  . Drug use: Yes    Types: Marijuana     Allergies   Ciprofloxacin   Review of Systems Review of Systems  Constitutional: Positive for fever.  Musculoskeletal: Positive for back pain.  Neurological: Positive for weakness.  All other systems reviewed and are negative.    Physical Exam Updated Vital Signs BP (!) 153/85   Pulse 89   Temp 97.7 F (36.5 C) (Oral)   Resp 16   Ht 5\' 6"  (1.676 m)   Wt 70.3 kg (155 lb)  LMP 12/08/2006   SpO2 99%   BMI 25.02 kg/m   Physical Exam  Constitutional: She appears well-developed and well-nourished.  HENT:  Head: Normocephalic.  Eyes: Pupils are equal, round, and reactive to light.  Neck: Normal range of motion.  Cardiovascular: Normal rate.  Pulmonary/Chest: Effort normal.  Abdominal: Soft.  Musculoskeletal: Normal range of motion.  Neurological: She is alert.  Skin: Skin is warm.  Psychiatric: She has a normal mood and affect.  Nursing note and vitals reviewed.    ED Treatments / Results  Labs (all labs ordered are listed, but only abnormal results are displayed) Labs Reviewed  CBC WITH DIFFERENTIAL/PLATELET - Abnormal; Notable for the following components:      Result Value   WBC 17.6 (*)    RBC 3.72 (*)    Hemoglobin 11.1 (*)    HCT 33.4 (*)    Platelets 401 (*)    Neutro Abs 12.8 (*)    Monocytes Absolute 1.7 (*)    All other components within normal limits  COMPREHENSIVE METABOLIC PANEL - Abnormal; Notable for the following components:   Sodium 134 (*)    Albumin 3.1 (*)    ALT 10 (*)    Alkaline  Phosphatase 173 (*)    All other components within normal limits    EKG  EKG Interpretation None       Radiology No results found.  Procedures Procedures (including critical care time)  Medications Ordered in ED Medications  HYDROmorphone (DILAUDID) injection 1 mg (not administered)  ondansetron (ZOFRAN) injection 4 mg (not administered)     Initial Impression / Assessment and Plan / ED Course  I have reviewed the triage vital signs and the nursing notes.  Pertinent labs & imaging results that were available during my care of the patient were reviewed by me and considered in my medical decision making (see chart for details).    Current Meds  Medication Sig  . Calcium Carbonate-Vitamin D (CALCIUM 600+D PO) Take 1 tablet by mouth daily.  Marland Kitchen docusate sodium (COLACE) 250 MG capsule Take 1 capsule (250 mg total) by mouth daily. While taking pain medication  . Multiple Vitamin (MULTIVITAMIN WITH MINERALS) TABS tablet Take 1 tablet by mouth daily.  . valACYclovir (VALTREX) 1000 MG tablet Take 1,000 mg by mouth daily as needed (fever blisters).   . [DISCONTINUED] oxyCODONE-acetaminophen (PERCOCET) 7.5-325 MG tablet Take 2 tablets by mouth every 4 (four) hours as needed for severe pain.   Fentanyl patch placed here   Pt feels better after pain medication here.  Pt reports she is almost out of percocet.  Pt given rx for 15 tablets/  Pt advised she needs to take as directed.  Pt given ativan to help with anxiety. Final Clinical Impressions(s) / ED Diagnoses   Final diagnoses:  Low back pain without sciatica, unspecified back pain laterality, unspecified chronicity    ED Discharge Orders    None    Keep follow up appointment with Dr. Marin Olp on Monday as scheduled.    Fransico Meadow, [place] 05/06/17 1335    Carmin Muskrat, MD 05/06/17 [phone]

## 2017-05-06 NOTE — ED Notes (Signed)
ED Provider at bedside. 

## 2017-05-06 NOTE — ED Triage Notes (Signed)
Pt states she has cancer and it is malignant  Pt is c/o pain to her back, hips  Pt states she has been taking pain medication at home without relief  Pt states she has tumors on her spine  Pt states she is to see Dr Marin Olp on Monday but she cannot wait due to the pain

## 2017-05-07 MED FILL — fentaNYL 25 MCG/HR PT72: 25 | 30 days supply | Qty: 10 | Fill #0

## 2017-05-10 ENCOUNTER — Other Ambulatory Visit: Payer: Self-pay

## 2017-05-10 ENCOUNTER — Encounter: Payer: Self-pay | Admitting: Hematology & Oncology

## 2017-05-10 ENCOUNTER — Inpatient Hospital Stay: Payer: BLUE CROSS/BLUE SHIELD | Attending: Hematology & Oncology | Admitting: Hematology & Oncology

## 2017-05-10 ENCOUNTER — Inpatient Hospital Stay: Payer: BLUE CROSS/BLUE SHIELD

## 2017-05-10 VITALS — BP 145/71 | HR 89 | Temp 99.2°F | Resp 20 | Wt 156.0 lb

## 2017-05-10 DIAGNOSIS — G63 Polyneuropathy in diseases classified elsewhere: Principal | ICD-10-CM

## 2017-05-10 DIAGNOSIS — C801 Malignant (primary) neoplasm, unspecified: Secondary | ICD-10-CM | POA: Insufficient documentation

## 2017-05-10 DIAGNOSIS — D472 Monoclonal gammopathy: Secondary | ICD-10-CM

## 2017-05-10 DIAGNOSIS — Z720 Tobacco use: Secondary | ICD-10-CM | POA: Diagnosis not present

## 2017-05-10 DIAGNOSIS — M545 Low back pain: Secondary | ICD-10-CM | POA: Diagnosis not present

## 2017-05-10 DIAGNOSIS — C419 Malignant neoplasm of bone and articular cartilage, unspecified: Secondary | ICD-10-CM

## 2017-05-10 LAB — CBC WITH DIFFERENTIAL (CANCER CENTER ONLY)
BASOS PCT: 0 %
Basophils Absolute: 0 10*3/uL (ref 0.0–0.1)
EOS ABS: 0.3 10*3/uL (ref 0.0–0.5)
EOS PCT: 2 %
HCT: 33.1 % — ABNORMAL LOW (ref 34.8–46.6)
Hemoglobin: 10.7 g/dL — ABNORMAL LOW (ref 11.6–15.9)
LYMPHS ABS: 3 10*3/uL (ref 0.9–3.3)
Lymphocytes Relative: 18 %
MCH: 29.5 pg (ref 26.0–34.0)
MCHC: 32.3 g/dL (ref 32.0–36.0)
MCV: 91.2 fL (ref 81.0–101.0)
MONO ABS: 1.3 10*3/uL — AB (ref 0.1–0.9)
Monocytes Relative: 8 %
Neutro Abs: 11.9 10*3/uL — ABNORMAL HIGH (ref 1.5–6.5)
Neutrophils Relative %: 72 %
PLATELETS: 445 10*3/uL — AB (ref 145–400)
RBC: 3.63 MIL/uL — ABNORMAL LOW (ref 3.70–5.32)
RDW: 12.6 % (ref 11.1–15.7)
WBC: 16.5 10*3/uL — AB (ref 3.9–10.3)

## 2017-05-10 LAB — CMP (CANCER CENTER ONLY)
ALK PHOS: 159 U/L — AB (ref 26–84)
ALT: 14 U/L (ref 0–55)
AST: 20 U/L (ref 5–34)
Albumin: 3 g/dL — ABNORMAL LOW (ref 3.5–5.0)
Anion gap: 7 (ref 5–15)
BILIRUBIN TOTAL: 0.4 mg/dL (ref 0.2–1.2)
BUN: 8 mg/dL (ref 7–22)
CALCIUM: 9.6 mg/dL (ref 8.0–10.3)
CHLORIDE: 103 mmol/L (ref 98–108)
CO2: 26 mmol/L (ref 18–33)
Creatinine: 0.7 mg/dL (ref 0.60–1.10)
Glucose, Bld: 116 mg/dL (ref 73–118)
Potassium: 4.2 mmol/L (ref 3.5–5.1)
SODIUM: 136 mmol/L (ref 128–145)
Total Protein: 8 g/dL (ref 6.4–8.1)

## 2017-05-10 LAB — TECHNOLOGIST SMEAR REVIEW

## 2017-05-10 LAB — SAVE SMEAR

## 2017-05-10 MED ORDER — OXYCODONE HCL 10 MG PO TABS: 10.0000 mg | ORAL_TABLET | ORAL | 0 refills | Status: DC | PRN

## 2017-05-10 MED ORDER — OXYCODONE HCL 10 MG PO TABS: 5.0000 mg | ORAL_TABLET | ORAL | 0 refills | Status: DC | PRN

## 2017-05-10 MED ORDER — DEXAMETHASONE 6 MG PO TABS: ORAL_TABLET | ORAL | 4 refills | Status: DC

## 2017-05-10 NOTE — Progress Notes (Signed)
Referral MD  Reason for Referral: Metastatic adenocarcinoma of unknown primary-no metastasis  Chief Complaint  Patient presents with  . New Patient (Initial Visit)  : I am having a lot of back pain  HPI: [name] is a very nice 62 year old white female.  She has a history of stones in her pancreas.  She has been down to [place] to help with this.  Starting in November, she began to have some lower back pain.  This progressed to the point that she is having radiation down the right leg.  She ended up in the emergency room.  This was in December.  She had x-rays taken.  Looks like from the x-rays that she had some degenerative disease.  She was referred to [facility].  Dr. Cyndy Freeze had an MRI done.  Unfortunately, we do not have the MRI report.  Apparently, the MRI report showed some bony lesions.  Apparently, it was felt that she had myeloma.  She underwent a bone biopsy.  This was done on January 7.  The pathology report ([id number]) showed metastatic adenocarcinoma.  The immunohistochemical profile was nonspecific.  The pathologist really narrowed it down to breast, upper GI, and pancreaticobiliary.  She has not had any other studies done.  She has not lost weight.  She has had some night sweats.  She has had no obvious change in bowel or bladder habits.  She used to smoke quite a bit.  She has cut back a lot.  She has had no rashes.  There is been no leg swelling.  She has had no nausea or vomiting.  She is on Percocet.  This is helped a little bit.  Of course, the other doctors are very afraid of giving her pain medicine because of the opioid crisis.  Dr. Cyndy Freeze did give her some fentanyl patches.  This is helped her a little bit.  Lab work that has been done on her has been unremarkable.  She has not had a mammogram for 6 years.  She is never had a colonoscopy.  She has had no headache.  There is been no blurred vision.  She has had no  dysphagia or odynophagia.  Overall, I would say performance status is ECOG 1.   Past Medical History:  Diagnosis Date  . Alcohol abuse    H/o withdrawal   . Alcohol withdrawal ([place]) 10/02/2013  . Anxiety   . Chronic diarrhea    hx of  . Constipation   . Depression   . Elevated liver function tests few yrs ago  . Pancreatitis   . PONV (postoperative nausea and vomiting)   . Rosacea   . Wears glasses   :  Past Surgical History:  Procedure Laterality Date  . CHOLECYSTECTOMY    . COLONOSCOPY  Never  . DIAGNOSTIC MAMMOGRAM  2008  . ERCP  10/06/2011   Procedure: ENDOSCOPIC RETROGRADE CHOLANGIOPANCREATOGRAPHY (ERCP);  Surgeon: Beryle Beams, MD;  Location: [facility];  Service: Endoscopy;  Laterality: N/A;  . ESOPHAGOGASTRODUODENOSCOPY  03/21/2011   Procedure: ESOPHAGOGASTRODUODENOSCOPY (EGD);  Surgeon: Scarlette Shorts, MD;  Location: [facility] ENDOSCOPY;  Service: Endoscopy;  Laterality: N/A;  Patient may need to be done at bedside tomorrow depending on status  . ESOPHAGOGASTRODUODENOSCOPY  01/06/2012   Procedure: ESOPHAGOGASTRODUODENOSCOPY (EGD);  Surgeon: Wonda Horner, MD;  Location: [facility];  Service: Endoscopy;  Laterality: N/A;  bedside  . HERNIA REPAIR    . INSERTION OF MESH  07/09/2016   Procedure: INSERTION  OF MESH;  Surgeon: Michael Boston, MD;  Location: [facility];  Service: General;;  . LAPAROTOMY  01/03/2012   Procedure: EXPLORATORY LAPAROTOMY;  Surgeon: Pedro Earls, MD;  Location: [facility];  Service: General;  Laterality: N/A;  debridement of necrotic pancreas and placement of drains x2  . pancreatic cyst removed   2015  . SHOULDER SURGERY Left 02/18/2010   clavical repair  . TUBAL LIGATION    . VENTRAL HERNIA REPAIR N/A 07/09/2016   Procedure: LAPAROSCOPIC LYSIS OF ADHESIONS  VENTRAL WALL HERNIA REPAIR WITH MESH EXCISION OF HERNIA [place];  Surgeon: Michael Boston, MD;  Location: [facility];  Service: General;  Laterality: N/A;  :   Current Outpatient Medications:  .  Calcium  Carbonate-Vitamin D (CALCIUM 600+D PO), Take 1 tablet by mouth daily., Disp: , Rfl:  .  dexamethasone (DECADRON) 6 MG tablet, Take 5 pills today with food, then 3 pills a day with food., Disp: 90 tablet, Rfl: 4 .  docusate sodium (COLACE) 250 MG capsule, Take 1 capsule (250 mg total) by mouth daily. While taking pain medication, Disp: 30 capsule, Rfl: 0 .  EQ NICOTINE 14 MG/24HR patch, , Disp: , Rfl: 0 .  fentaNYL (DURAGESIC - DOSED MCG/HR) 25 MCG/HR patch, APPLY 1 PATCH TOPICALLY EVERY 72 HOURS, Disp: , Rfl: 0 .  LORazepam (ATIVAN) 1 MG tablet, Take 1 tablet (1 mg total) by mouth 3 (three) times daily as needed for anxiety., Disp: 15 tablet, Rfl: 0 .  Multiple Vitamin (MULTIVITAMIN WITH MINERALS) TABS tablet, Take 1 tablet by mouth daily., Disp: , Rfl:  .  Oxycodone HCl 10 MG TABS, Take 1 tablet (10 mg total) by mouth every 4 (four) hours as needed., Disp: 120 tablet, Rfl: 0 .  valACYclovir (VALTREX) 1000 MG tablet, Take 1,000 mg by mouth daily as needed (fever blisters). , Disp: , Rfl: :  :  Allergies  Allergen Reactions  . Ciprofloxacin Nausea Only  :  Family History  Problem Relation Age of Onset  . Stroke Mother   . Heart disease Father   . Heart failure Father   . Heart disease Sister   :  Social History   Socioeconomic History  . Marital status: Divorced    Spouse name: Not on file  . Number of children: Not on file  . Years of education: Not on file  . Highest education level: Not on file  Social Needs  . Financial resource strain: Not on file  . Food insecurity - worry: Not on file  . Food insecurity - inability: Not on file  . Transportation needs - medical: Not on file  . Transportation needs - non-medical: Not on file  Occupational History  . Not on file  Tobacco Use  . Smoking status: Light Tobacco Smoker    Packs/day: 0.25    Years: 15.00    Pack years: 3.75    Types: Cigarettes  . Smokeless tobacco: Never Used  . Tobacco comment: trying nicotine patch   Substance and Sexual Activity  . Alcohol use: No    Alcohol/week: 1.5 - 2.0 oz    Types: 3 - 4 Standard drinks or equivalent per week    Comment: quit 2016  . Drug use: Yes    Types: Marijuana  . 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  Other Topics Concern  . Not on file  Social History Narrative   Has a son and some friends with whom  she is close. Was in rehab in 09/2010 and was sober for a few months after.   :  Pertinent items are noted in HPI.  Exam: Well-developed and well-nourished white female in no obvious distress.  Vital signs show a temperature of 99.2.  Pulse 89.  Blood pressure 145/71.  Weight is 156 pounds.  Head exam shows no ocular or oral lesions.  There are no palpable cervical or supra clavicular lymph nodes.  Lungs are clear bilaterally.  Cardiac exam regular rate and rhythm with no murmurs, rubs or bruits.  Axillary exam shows no bilateral axillary adenopathy.  Abdomen is soft.  She has good bowel sounds.  There is no fluid wave.  There is no palpable liver or spleen tip.  Back exam shows some tenderness over the lumbosacral spine.  She has some muscle spasms bilaterally.  Extremities shows no clubbing, cyanosis or edema.  She has good strength bilaterally.  She has good range of motion of her joints.  Skin exam shows no rashes, ecchymoses or petechia.  Neurological exam shows no focal neurological deficits. @IPVITALS @   Recent Labs    05/10/17 1537  WBC 16.5*  HCT 33.1*  PLT 445*   Recent Labs    05/10/17 1537  NA 136  K 4.2  CL 103  CO2 26  GLUCOSE 116  BUN 8  CALCIUM 9.6    Blood smear review: None  Pathology: As above    Assessment and Plan: [name] is a very charming 62 year old white female.  She has metastatic adenocarcinoma.  For right now, this is unknown primary.  Given that she was a smoker, lung cancer would certainly be a possibility.  Being that she is female, and not had a mammogram  in 6 years, breast cancer is also a concern.  We really have to get studies on her to see exactly where the primary is.  I am not sure if we can get to the bottom of this was just a bone biopsy.  A lot of times, bone biopsies are not adequate so we can determine the site of origin.  She deftly needs a radiation oncology to see her.  I will call Dr. Sondra Come and see if he gets here quickly.  We have to help with her pain.  This is really a big problem for her.  She is on the fentanyl patches.  I gave her a prescription for oxycodone (10 mg p.o. every 4 hours as needed) to take for breakthrough pain.  I also gave her a prescription for Decadron to take that might help with some of the bone inflammation.  She will need Xgeva at some point.  I was not truly aware of her diagnosis when she had her labs done.  We will have to get tumor markers on here.  We will get CT scans.  I will get a PET scan on her.  I will also get a mammogram.  I spent a good hour or more with she and her friend.  I gave her a prayer blanket.  She is very appreciative of [place] trying to help her and to help with her quality of life.  I talked to her and her friend.  I told her that no matter what, we are dealing with a disease that is treatable but is not curable.  She already has stage IV disease.  We will plan to get her back once we have the rest of our studies done and then we  can come up with our recommendations for systemic therapy.

## 2017-05-11 ENCOUNTER — Encounter: Payer: Self-pay | Admitting: Radiation Oncology

## 2017-05-11 LAB — KAPPA/LAMBDA LIGHT CHAINS
KAPPA FREE LGHT CHN: 26.2 mg/L — AB (ref 3.3–19.4)
KAPPA, LAMDA LIGHT CHAIN RATIO: 1.15 (ref 0.26–1.65)
Lambda free light chains: 22.7 mg/L (ref 5.7–26.3)

## 2017-05-11 LAB — IGG, IGA, IGM
IGG (IMMUNOGLOBIN G), SERUM: 1215 mg/dL (ref 700–1600)
IgA: 509 mg/dL — ABNORMAL HIGH (ref 87–352)
IgM (Immunoglobulin M), Srm: 63 mg/dL (ref 26–217)

## 2017-05-11 LAB — BETA 2 MICROGLOBULIN, SERUM: BETA 2 MICROGLOBULIN: 1.9 mg/L (ref 0.6–2.4)

## 2017-05-12 ENCOUNTER — Other Ambulatory Visit: Payer: Self-pay | Admitting: *Deleted

## 2017-05-12 ENCOUNTER — Telehealth: Payer: Self-pay | Admitting: *Deleted

## 2017-05-12 LAB — PROTEIN ELECTROPHORESIS, SERUM, WITH REFLEX
A/G RATIO SPE: 0.7 (ref 0.7–1.7)
ALBUMIN ELP: 2.9 g/dL (ref 2.9–4.4)
Alpha-1-Globulin: 0.4 g/dL (ref 0.0–0.4)
Alpha-2-Globulin: 1.1 g/dL — ABNORMAL HIGH (ref 0.4–1.0)
BETA GLOBULIN: 1.4 g/dL — AB (ref 0.7–1.3)
Gamma Globulin: 1 g/dL (ref 0.4–1.8)
Globulin, Total: 3.9 g/dL (ref 2.2–3.9)
Total Protein ELP: 6.8 g/dL (ref 6.0–8.5)

## 2017-05-12 MED ORDER — LORAZEPAM 1 MG PO TABS: 1.0000 mg | ORAL_TABLET | Freq: Three times a day (TID) | ORAL | 0 refills | Status: DC | PRN

## 2017-05-12 MED FILL — OXYCODONE/APAP 7.5/325MG: 7.5-325 | 5 days supply | Qty: 30 | Fill #0

## 2017-05-12 NOTE — Telephone Encounter (Signed)
Patient states she is under extreme stress and is very anxious. She doesn't feel like she is sleeping well. She'd like to know if Dr Marin Olp will prescribe anything for anxiety.  Spoke with Dr Marin Olp. He will prescribe some Ativan for patient. Patient is aware and pharmacy confirmed.

## 2017-05-12 NOTE — Progress Notes (Signed)
Histology and Location of Primary Cancer: metastatic adenocarcinoma unknown primary  Location(s) of Symptomatic Metastases: lower back pain radiation down her right leg that started in August/September.  Biopsies revealed:  04/26/17 Diagnosis Bone, biopsy - METASTATIC ADENOCARCINOMA. - SEE COMMENT. Microscopic Comment The malignant cells are positive for cytokeratin 7. They are negative for GATA-3, cytokeratin 20, estrogen receptor, TTF-1, CDX-2, Napsin-A and GCDFP. The immunohistochemical profile is non specific. However, cytokeratin 7 positive/cytokeratin 20 negative carcinoma may be seen from primary breast, upper GI and pancreatobiliary. Clinical and radiologic correlation is necessary. (JBK:gt, 04/29/17)  Past/Anticipated chemotherapy by medical oncology, if any: chemo after scans.  Pain on a scale of 0-10 is: 8 in her lower back/right hip radiating down her leg.  She is currently using 2 25 mcg fentanyl patches plus oxycodone 10 mg q 4 hours.  If Spine Met(s), symptoms, if any, include:  Bowel/Bladder retention or incontinence (please describe): has constipation.  Also has to strain to urinate.  Numbness or weakness in extremities (please describe): weakness in right leg.  Current Decadron regimen, if applicable: started a decadron taper on Monday.  Ambulatory status? Walker? Wheelchair?: Ambulatory  SAFETY ISSUES:  Prior radiation? no  Pacemaker/ICD? no  Possible current pregnancy? no  Is the patient on methotrexate? no  Current Complaints / other details:  Patient is here with her significant other.  BP (!) 147/74 (BP Location: Left Arm, Patient Position: Sitting)   Pulse 89   Temp 98 F (36.7 C) (Oral)   Ht '5\' 6"'  (1.676 m)   Wt 158 lb 6.4 oz (71.8 kg)   LMP 12/08/2006   SpO2 97%   BMI 25.57 kg/m    Wt Readings from Last 3 Encounters:  05/13/17 158 lb 6.4 oz (71.8 kg)  05/10/17 156 lb (70.8 kg)  05/06/17 155 lb (70.3 kg)

## 2017-05-13 ENCOUNTER — Other Ambulatory Visit: Payer: Self-pay

## 2017-05-13 ENCOUNTER — Ambulatory Visit
Admission: RE | Admit: 2017-05-13 | Discharge: 2017-05-13 | Disposition: A | Discharge status: Home / Self Care | Payer: BLUE CROSS/BLUE SHIELD | Source: Ambulatory Visit | Attending: Radiation Oncology | Admitting: Radiation Oncology

## 2017-05-13 ENCOUNTER — Encounter: Payer: Self-pay | Admitting: Radiation Oncology

## 2017-05-13 DIAGNOSIS — C7951 Secondary malignant neoplasm of bone: Secondary | ICD-10-CM | POA: Diagnosis present

## 2017-05-13 DIAGNOSIS — C801 Malignant (primary) neoplasm, unspecified: Secondary | ICD-10-CM | POA: Insufficient documentation

## 2017-05-13 DIAGNOSIS — F17210 Nicotine dependence, cigarettes, uncomplicated: Secondary | ICD-10-CM | POA: Diagnosis not present

## 2017-05-13 DIAGNOSIS — L719 Rosacea, unspecified: Secondary | ICD-10-CM | POA: Insufficient documentation

## 2017-05-13 DIAGNOSIS — F1010 Alcohol abuse, uncomplicated: Secondary | ICD-10-CM | POA: Insufficient documentation

## 2017-05-13 DIAGNOSIS — R339 Retention of urine, unspecified: Secondary | ICD-10-CM | POA: Diagnosis not present

## 2017-05-13 DIAGNOSIS — Z8619 Personal history of other infectious and parasitic diseases: Secondary | ICD-10-CM | POA: Diagnosis not present

## 2017-05-13 DIAGNOSIS — G893 Neoplasm related pain (acute) (chronic): Secondary | ICD-10-CM | POA: Insufficient documentation

## 2017-05-13 DIAGNOSIS — F418 Other specified anxiety disorders: Secondary | ICD-10-CM | POA: Insufficient documentation

## 2017-05-13 DIAGNOSIS — Z510 Encounter for antineoplastic radiation therapy: Secondary | ICD-10-CM | POA: Diagnosis not present

## 2017-05-13 DIAGNOSIS — Z79899 Other long term (current) drug therapy: Secondary | ICD-10-CM | POA: Diagnosis not present

## 2017-05-13 DIAGNOSIS — K5900 Constipation, unspecified: Secondary | ICD-10-CM | POA: Diagnosis not present

## 2017-05-13 MED ORDER — PROCHLORPERAZINE MALEATE 10 MG PO TABS: 10.0000 mg | ORAL_TABLET | Freq: Four times a day (QID) | ORAL | 1 refills | Status: AC | PRN

## 2017-05-13 NOTE — Progress Notes (Signed)
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 female who presents with metastatic adenocarcinoma, unknown primary. The patient began experiencing low back pain that radiated down her right leg in August. She reports the pain has been worsening since. The patient presented to her PCP, Dr. Avon Gully, on 02/25/17 and was diagnosed with sciatica of the right side. She presented to the ED on 03/30/17 for her sciatica and the pain was managed with 20 Norco. She returned to the ED on 04/06/17 with continued pain. Per the ED note, the patient had increased pain with palpation to the right sciatic notch and right lower back. Subsequent MRI of the lumbar spine on 04/14/17 revealed lesions in all imaged bones consistent with metastatic disease or possibly multiple myeloma. No fractures were noted. Biopsy of the right iliac bone on 04/26/17 showed metastatic adenocarcinoma. The immunohistochemical profile was nonspecific. The pathologist really narrowed it down to breast, upper GI, and pancreaticobiliary.  The patient grades her current pain as 8/10 in her lower back and right hip. She is managing the pain with two 25 mcg Fentanyl patches and Oxycodone 10 mg every 4 hours. She also notes constipation and straining to urinate. She associates the constipation with pain medication. She complains of occasional weakness and numbness in the right leg but ambulates without assistance. She denies headaches, hemoptysis, nipple discharge or bleeding. She began a Decadron taper on Monday. She presents with a friend to discuss the role of radiation therapy  as part of her disease management.   PREVIOUS RADIATION THERAPY: No  PAST MEDICAL HISTORY:  has a past medical history of Alcohol abuse, Alcohol withdrawal ([place]) (10/02/2013), Anxiety, Chronic diarrhea, Constipation, Depression, Elevated liver function tests (few yrs ago), Pancreatitis, PONV (postoperative nausea and vomiting), Rosacea, and Wears glasses.    PAST SURGICAL HISTORY: Past Surgical History:  Procedure Laterality Date  . CHOLECYSTECTOMY    . COLONOSCOPY  Never  . DIAGNOSTIC MAMMOGRAM  2008  . ERCP  10/06/2011   Procedure: ENDOSCOPIC RETROGRADE CHOLANGIOPANCREATOGRAPHY (ERCP);  Surgeon: Beryle Beams, MD;  Location: [facility];  Service: Endoscopy;  Laterality: N/A;  . ESOPHAGOGASTRODUODENOSCOPY  03/21/2011   Procedure: ESOPHAGOGASTRODUODENOSCOPY (EGD);  Surgeon: Scarlette Shorts, MD;  Location: [facility] ENDOSCOPY;  Service: Endoscopy;  Laterality: N/A;  Patient may need to be done at bedside tomorrow depending on status  . ESOPHAGOGASTRODUODENOSCOPY  01/06/2012   Procedure: ESOPHAGOGASTRODUODENOSCOPY (EGD);  Surgeon: Wonda Horner, MD;  Location: [facility] END[facility];  Service: Endoscopy;  Laterality: N/A;  bedside  . HERNIA REPAIR    . INSERTION OF MESH  07/09/2016   Procedure: INSERTION OF MESH;  Surgeon: Michael Boston, MD;  Location: [facility] ORS;  Service: General;;  . LAPAROTOMY  01/03/2012   Procedure: EXPLORATORY LAPAROTOMY;  Surgeon: Pedro Earls, MD;  Location: [facility] ORS;  Service: General;  Laterality: N/A;  debridement of necrotic pancreas and placement of drains x2  . pancreatic cyst removed   2015  . SHOULDER SURGERY Left 02/18/2010   clavical repair  . TUBAL LIGATION    . VENTRAL HERNIA REPAIR N/A 07/09/2016   Procedure: LAPAROSCOPIC LYSIS OF ADHESIONS  VENTRAL WALL HERNIA REPAIR WITH MESH EXCISION OF  HERNIA SAC;  Surgeon: Michael Boston, MD;  Location: [facility] OR[facility];  Service: General;  Laterality: N/A;    FAMILY HISTORY: family history includes Heart disease in her father and sister; Heart  failure in her father; Stroke in her mother.  SOCIAL HISTORY:  reports that she has been smoking cigarettes.  She has a 15.00 pack-year smoking history. she has never used smokeless tobacco. She reports that she drinks about 1.5 - 2.0 oz of alcohol per week. She reports that she uses drugs. Drug: Marijuana. 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  ALLERGIES: Ciprofloxacin  MEDICATIONS:  Current Outpatient Medications  Medication Sig Dispense Refill  . Calcium Carbonate-Vitamin D (CALCIUM 600+D PO) Take 1 tablet by mouth daily.    Marland Kitchen dexamethasone (DECADRON) 6 MG tablet Take 5 pills today with food, then 3 pills a day with food. 90 tablet 4  . docusate sodium (COLACE) 250 MG capsule Take 1 capsule (250 mg total) by mouth daily. While taking pain medication 30 capsule 0  . fentaNYL (DURAGESIC - DOSED MCG/HR) 25 MCG/HR patch APPLY 1 PATCH TOPICALLY EVERY 72 HOURS  0  . LORazepam (ATIVAN) 1 MG tablet Take 1 tablet (1 mg total) by mouth 3 (three) times daily as needed for anxiety. 60 tablet 0  . Multiple Vitamin (MULTIVITAMIN WITH MINERALS) TABS tablet Take 1 tablet by mouth daily.    . Oxycodone HCl 10 MG TABS Take 1 tablet (10 mg total) by mouth every 4 (four) hours as needed. 120 tablet 0  . EQ NICOTINE 14 MG/24HR patch   0  . prochlorperazine (COMPAZINE) 10 MG tablet Take 1 tablet (10 mg total) by mouth every 6 (six) hours as needed for nausea or vomiting. 30 tablet 1  . valACYclovir (VALTREX) 1000 MG tablet Take 1,000 mg by mouth daily as needed (fever blisters).      No current facility-administered medications for this encounter.     REVIEW OF SYSTEMS:  A 10+ POINT REVIEW OF SYSTEMS WAS OBTAINED including neurology, dermatology, psychiatry, cardiac, respiratory, lymph, extremities, GI, GU, musculoskeletal, constitutional, reproductive, HEENT. All pertinent positives are noted in the HPI. All others are negative.  PHYSICAL EXAM:  height is _0  (1.676 m) and weight is 158 lb 6.4 oz  (71.8 kg). Her oral temperature is 98 F (36.7 C). Her blood pressure is 147/74 (abnormal) and her pulse is 89. Her oxygen saturation is 97%.   General: Alert and oriented, in no acute distress HEENT: Head is normocephalic. Extraocular movements are intact. Oropharynx is clear. Neck: Neck is supple, no palpable cervical or supraclavicular lymphadenopathy. Heart: Regular in rate and rhythm with no murmurs, rubs, or gallops. Chest: Clear to auscultation bilaterally, with no rhonchi, wheezes, or rales. Abdomen: Soft, nontender, nondistended, with no rigidity or guarding. Tenderness along right sacroiliac area and right buttocks.  Long vertical scar in abdominal area.  Extremities: No cyanosis or edema. Lymphatics: see Neck Exam Skin: No concerning lesions. Musculoskeletal: symmetric strength and muscle tone throughout. Neurologic: Cranial nerves II through XII are grossly intact. No obvious focalities. Speech is fluent. Coordination is intact. Psychiatric: Judgment and insight are intact. Affect is appropriate.    ECOG = 1  0 - Asymptomatic (Fully active, able to carry on all predisease activities without restriction)  1 - Symptomatic but completely ambulatory (Restricted in physically strenuous activity but ambulatory and able to carry out work of a light or sedentary nature. For example, light housework, office work)  2 - Symptomatic, <50% in bed during  the day (Ambulatory and capable of all self care but unable to carry out any work activities. Up and about more than 50% of waking hours)  3 - Symptomatic, >50% in bed, but not bedbound (Capable of only limited self-care, confined to bed or chair 50% or more of waking hours)  4 - Bedbound (Completely disabled. Cannot carry on any self-care. Totally confined to bed or chair)  5 - Death   Eustace Pen MM, Creech RH, Tormey DC, et al. [phone]). "Toxicity and response criteria of the [facility]". [place] Oncol. 5 (6):  649-55  LABORATORY DATA:  Lab Results  Component Value Date   WBC 16.5 (H) 05/10/2017   HGB 11.1 (L) 05/06/2017   HCT 33.1 (L) 05/10/2017   MCV 91.2 05/10/2017   PLT 445 (H) 05/10/2017   NEUTROABS 11.9 (H) 05/10/2017   Lab Results  Component Value Date   NA 136 05/10/2017   K 4.2 05/10/2017   CL 103 05/10/2017   CO2 26 05/10/2017   GLUCOSE 116 05/10/2017   CREATININE 0.55 05/06/2017   CALCIUM 9.6 05/10/2017      RADIOGRAPHY: Ct Biopsy  Result Date: 04/26/2017 INDICATION: Bone lesions EXAM: CT BIOPSY MEDICATIONS: None. ANESTHESIA/SEDATION: Fentanyl 175 mcg IV; Versed Three mg IV Moderate Sedation Time:  15 minutes The patient was continuously monitored during the procedure by the interventional radiology nurse under my direct supervision. FLUOROSCOPY TIME:  Fluoroscopy Time:  minutes  seconds ( mGy). COMPLICATIONS: None immediate. PROCEDURE: Informed written consent was obtained from the patient after a thorough discussion of the procedural risks, benefits and alternatives. All questions were addressed. Maximal Sterile Barrier Technique was utilized including caps, mask, sterile gowns, sterile gloves, sterile drape, hand hygiene and skin antiseptic. A timeout was performed prior to the initiation of the procedure. Under CT guidance, a(n) 11 gauge guide needle was advanced into the right iliac bone lesion. A single 11 gauge core was obtained. Post biopsy images demonstrate no hemorrhage. Patient tolerated the procedure well without complication. Vital sign monitoring by nursing staff during the procedure will continue as patient is in the special procedures unit for post procedure observation. FINDINGS: The images document guide needle placement within the right iliac bone. Post biopsy images demonstrate no hemorrhage. IMPRESSION: Successful right iliac bone lesion core biopsy. Electronically Signed   By: Marybelle Killings M.D.   On: 04/26/2017 15:19      IMPRESSION: Metastatic adenocarcinoma,  unknown primary. I discussed the course of treatment, side effects, and potential toxicities of radiotherapy with the patient and her friend. She appears to understand and wishes to proceed with treatment. I anticipate 14 treatments of palliative radiotherapy to the lumbosacral spine. A consent form was signed and a copy was placed in the patient's chart.   PLAN: CT simulation and treatment planning is scheduled for today at 3 PM. Anticipate treatment to begin next week. She will receive 14 treatments to the lumbosacral spine region. Patient is scheduled to undergo CT of the chest, abdomen, pelvis as well as PET scan, and bilateral mammogram to complete staging workup and to attempt to identify the primary site.  Planning CT scan  today showed a mass in the left lower lung area so working diagnosis is likely primary lung cancer.      ------------------------------------------------  Blair Promise, PhD, MD  This document serves as a record of services personally performed by Gery Pray, MD. It was created on his behalf by Bethann Humble, a trained medical scribe.  The creation of this record is based on the scribe's personal observations and the provider's statements to them. This document has been checked and approved by the attending provider.

## 2017-05-13 NOTE — Progress Notes (Signed)
  Radiation Oncology         (336) [phone] ________________________________  Name: Jachelle Fluty MRN: [id number]  Date: 05/13/2017  DOB: 1955-09-14  SIMULATION AND TREATMENT PLANNING NOTE    ICD-10-CM   1. Osseous metastasis (HCC) C79.51   2. Adenocarcinoma of unknown primary ([place]) C80.1     DIAGNOSIS: Metastatic adenocarcinoma, unknown primary  NARRATIVE:  The patient was brought to the [facility].  Identity was confirmed.  All relevant records and images related to the planned course of therapy were reviewed.  The patient freely provided informed written consent to proceed with treatment after reviewing the details related to the planned course of therapy. The consent form was witnessed and verified by the simulation staff.  Then, the patient was set-up in a stable reproducible  supine position for radiation therapy.  CT images were obtained.  Surface markings were placed.  The CT images were loaded into the planning software.  Then the target and avoidance structures were contoured.  Treatment planning then occurred.  The radiation prescription was entered and confirmed.  Then, I designed and supervised the construction of a total of 3 medically necessary complex treatment devices.  I have requested : 3D Simulation  I have requested a DVH of the following structures: GTV, kidneys, and spinal cord..  I have ordered:dose calc.  PLAN:  The patient will receive 35 Gy in 14 fractions directed at the lumbosacral spine.  -----------------------------------  Blair Promise, PhD, MD  This document serves as a record of services personally performed by Gery Pray, MD. It was created on his behalf by Bethann Humble, a trained medical scribe. The creation of this record is based on the scribe's personal observations and the provider's statements to them. This document has been checked and approved by the attending provider.

## 2017-05-14 ENCOUNTER — Encounter: Payer: Self-pay | Admitting: Hematology & Oncology

## 2017-05-18 DIAGNOSIS — C7951 Secondary malignant neoplasm of bone: Secondary | ICD-10-CM | POA: Diagnosis not present

## 2017-05-19 ENCOUNTER — Ambulatory Visit (HOSPITAL_COMMUNITY): Payer: BLUE CROSS/BLUE SHIELD

## 2017-05-19 ENCOUNTER — Telehealth: Payer: Self-pay | Admitting: *Deleted

## 2017-05-19 ENCOUNTER — Other Ambulatory Visit: Payer: Self-pay | Admitting: *Deleted

## 2017-05-19 MED ORDER — FENTANYL 75 MCG/HR TD PT72: MEDICATED_PATCH | TRANSDERMAL | 0 refills | Status: DC

## 2017-05-19 NOTE — Telephone Encounter (Signed)
Patient is c/o increased back pain. She states she is taking the following   Decadron 18mg  daily. Oxycodone 20mg  q4h Fentanyl patch 51mcg Ibuprofen  She states her pain is still uncontrolled. Her radiation will start tomorrow.    Dr Marin Olp would like her fentanyl to increase to 89mcg every 3 days. He would like her to come in on Friday for some IV medication and fluids. Patient scheduled for Friday morning.  Patient aware of appointment, new prescription, pharmacy confirmed.

## 2017-05-20 ENCOUNTER — Ambulatory Visit
Admission: RE | Admit: 2017-05-20 | Discharge: 2017-05-20 | Disposition: A | Discharge status: Home / Self Care | Payer: BLUE CROSS/BLUE SHIELD | Source: Ambulatory Visit | Attending: Radiation Oncology | Admitting: Radiation Oncology

## 2017-05-20 DIAGNOSIS — C7951 Secondary malignant neoplasm of bone: Secondary | ICD-10-CM | POA: Diagnosis not present

## 2017-05-20 NOTE — Progress Notes (Signed)
  Radiation Oncology         (336) [phone] ________________________________  Name: [name] MRN: [id number]  Date: 05/20/2017  DOB: October 11, 1955  Simulation Verification Note    ICD-10-CM   1. Osseous metastasis (HCC) C79.51     Status: outpatient  NARRATIVE: The patient was brought to the treatment unit and placed in the planned treatment position. The clinical setup was verified. Then port films were obtained and uploaded to the radiation oncology medical record software.  The treatment beams were carefully compared against the planned radiation fields. The position location and shape of the radiation fields was reviewed. They targeted volume of tissue appears to be appropriately covered by the radiation beams. Organs at risk appear to be excluded as planned.  Based on my personal review, I approved the simulation verification. The patient's treatment will proceed as planned.  -----------------------------------  Blair Promise, PhD, MD

## 2017-05-21 ENCOUNTER — Inpatient Hospital Stay: Payer: BLUE CROSS/BLUE SHIELD | Attending: Hematology & Oncology

## 2017-05-21 ENCOUNTER — Ambulatory Visit
Admission: RE | Admit: 2017-05-21 | Discharge: 2017-05-21 | Disposition: A | Discharge status: Home / Self Care | Payer: BLUE CROSS/BLUE SHIELD | Source: Ambulatory Visit | Attending: Radiation Oncology | Admitting: Radiation Oncology

## 2017-05-21 ENCOUNTER — Other Ambulatory Visit: Payer: Self-pay

## 2017-05-21 VITALS — BP 138/58 | HR 82 | Temp 98.4°F | Resp 18

## 2017-05-21 DIAGNOSIS — R918 Other nonspecific abnormal finding of lung field: Secondary | ICD-10-CM | POA: Diagnosis not present

## 2017-05-21 DIAGNOSIS — R634 Abnormal weight loss: Secondary | ICD-10-CM | POA: Insufficient documentation

## 2017-05-21 DIAGNOSIS — R52 Pain, unspecified: Secondary | ICD-10-CM | POA: Diagnosis not present

## 2017-05-21 DIAGNOSIS — C7951 Secondary malignant neoplasm of bone: Secondary | ICD-10-CM | POA: Insufficient documentation

## 2017-05-21 DIAGNOSIS — C801 Malignant (primary) neoplasm, unspecified: Secondary | ICD-10-CM | POA: Diagnosis not present

## 2017-05-21 DIAGNOSIS — Z720 Tobacco use: Secondary | ICD-10-CM | POA: Diagnosis not present

## 2017-05-21 MED ORDER — KETOROLAC TROMETHAMINE 15 MG/ML IJ SOLN
30.0000 mg | Freq: Once | INTRAMUSCULAR | Status: AC
  Administered 2017-05-21: 30 mg via INTRAVENOUS
  Filled 2017-05-21: qty 2

## 2017-05-21 MED ORDER — SODIUM CHLORIDE 0.9 % IV SOLN
1000.0000 mL | Freq: Once | INTRAVENOUS | Status: AC
  Administered 2017-05-21: 1000 mL via INTRAVENOUS

## 2017-05-21 MED ORDER — SODIUM CHLORIDE 0.9 % IV SOLN
20.0000 mg | Freq: Once | INTRAVENOUS | Status: AC
  Administered 2017-05-21: 20 mg via INTRAVENOUS
  Filled 2017-05-21: qty 2

## 2017-05-21 NOTE — Patient Instructions (Signed)
Dehydration, Adult Dehydration is when there is not enough fluid or water in your body. This happens when you lose more fluids than you take in. Dehydration can range from mild to very bad. It should be treated right away to keep it from getting very bad. Symptoms of mild dehydration may include:  Thirst.  Dry lips.  Slightly dry mouth.  Dry, warm skin.  Dizziness. Symptoms of moderate dehydration may include:  Very dry mouth.  Muscle cramps.  Dark pee (urine). Pee may be the color of tea.  Your body making less pee.  Your eyes making fewer tears.  Heartbeat that is uneven or faster than normal (palpitations).  Headache.  Light-headedness, especially when you stand up from sitting.  Fainting (syncope). Symptoms of very bad dehydration may include:  Changes in skin, such as: ? Cold and clammy skin. ? Blotchy (mottled) or pale skin. ? Skin that does not quickly return to normal after being lightly pinched and let go (poor skin turgor).  Changes in body fluids, such as: ? Feeling very thirsty. ? Your eyes making fewer tears. ? Not sweating when body temperature is high, such as in hot weather. ? Your body making very little pee.  Changes in vital signs, such as: ? Weak pulse. ? Pulse that is more than 100 beats a minute when you are sitting still. ? Fast breathing. ? Low blood pressure.  Other changes, such as: ? Sunken eyes. ? Cold hands and feet. ? Confusion. ? Lack of energy (lethargy). ? Trouble waking up from sleep. ? Short-term weight loss. ? Unconsciousness. Follow these instructions at home:  If told by your doctor, drink an ORS: ? Make an ORS by using instructions on the package. ? Start by drinking small amounts, about  cup (120 mL) every 5-10 minutes. ? Slowly drink more until you have had the amount that your doctor said to have.  Drink enough clear fluid to keep your pee clear or pale yellow. If you were told to drink an ORS, finish the ORS  first, then start slowly drinking clear fluids. Drink fluids such as: ? Water. Do not drink only water by itself. Doing that can make the salt (sodium) level in your body get too low (hyponatremia). ? Ice chips. ? Fruit juice that you have added water to (diluted). ? Low-calorie sports drinks.  Avoid: ? Alcohol. ? Drinks that have a lot of sugar. These include high-calorie sports drinks, fruit juice that does not have water added, and soda. ? Caffeine. ? Foods that are greasy or have a lot of fat or sugar.  Take over-the-counter and prescription medicines only as told by your doctor.  Do not take salt tablets. Doing that can make the salt level in your body get too high (hypernatremia).  Eat foods that have minerals (electrolytes). Examples include bananas, oranges, potatoes, tomatoes, and spinach.  Keep all follow-up visits as told by your doctor. This is important. Contact a doctor if:  You have belly (abdominal) pain that: ? Gets worse. ? Stays in one area (localizes).  You have a rash.  You have a stiff neck.  You get angry or annoyed more easily than normal (irritability).  You are more sleepy than normal.  You have a harder time waking up than normal.  You feel: ? Weak. ? Dizzy. ? Very thirsty.  You have peed (urinated) only a small amount of very dark pee during 6-8 hours. Get help right away if:  You have symptoms of   very bad dehydration.  You cannot drink fluids without throwing up (vomiting).  Your symptoms get worse with treatment.  You have a fever.  You have a very bad headache.  You are throwing up or having watery poop (diarrhea) and it: ? Gets worse. ? Does not go away.  You have blood or something green (bile) in your throw-up.  You have blood in your poop (stool). This may cause poop to look black and tarry.  You have not peed in 6-8 hours.  You pass out (faint).  Your heart rate when you are sitting still is more than 100 beats a  minute.  You have trouble breathing. This information is not intended to replace advice given to you by your health care provider. Make sure you discuss any questions you have with your health care provider. Document Released: 01/31/2009 Document Revised: 10/25/2015 Document Reviewed: 05/31/2015 Elsevier Interactive Patient Education  2018 Elsevier Inc.  

## 2017-05-24 ENCOUNTER — Other Ambulatory Visit (HOSPITAL_COMMUNITY): Payer: BLUE CROSS/BLUE SHIELD

## 2017-05-24 ENCOUNTER — Ambulatory Visit
Admission: RE | Admit: 2017-05-24 | Discharge: 2017-05-24 | Disposition: A | Discharge status: Home / Self Care | Payer: BLUE CROSS/BLUE SHIELD | Source: Ambulatory Visit | Attending: Radiation Oncology | Admitting: Radiation Oncology

## 2017-05-24 DIAGNOSIS — C7951 Secondary malignant neoplasm of bone: Secondary | ICD-10-CM | POA: Diagnosis not present

## 2017-05-25 ENCOUNTER — Ambulatory Visit
Admission: RE | Admit: 2017-05-25 | Discharge: 2017-05-25 | Disposition: A | Discharge status: Home / Self Care | Payer: BLUE CROSS/BLUE SHIELD | Source: Ambulatory Visit | Attending: Radiation Oncology | Admitting: Radiation Oncology

## 2017-05-25 ENCOUNTER — Ambulatory Visit (HOSPITAL_COMMUNITY)
Admission: RE | Admit: 2017-05-25 | Discharge: 2017-05-25 | Disposition: A | Discharge status: Home / Self Care | Payer: BLUE CROSS/BLUE SHIELD | Source: Ambulatory Visit | Attending: Hematology & Oncology | Admitting: Hematology & Oncology

## 2017-05-25 ENCOUNTER — Encounter (HOSPITAL_COMMUNITY): Payer: Self-pay

## 2017-05-25 DIAGNOSIS — K861 Other chronic pancreatitis: Secondary | ICD-10-CM | POA: Diagnosis not present

## 2017-05-25 DIAGNOSIS — K449 Diaphragmatic hernia without obstruction or gangrene: Secondary | ICD-10-CM | POA: Insufficient documentation

## 2017-05-25 DIAGNOSIS — C7951 Secondary malignant neoplasm of bone: Secondary | ICD-10-CM | POA: Diagnosis not present

## 2017-05-25 DIAGNOSIS — C419 Malignant neoplasm of bone and articular cartilage, unspecified: Secondary | ICD-10-CM | POA: Diagnosis not present

## 2017-05-25 DIAGNOSIS — I700 Atherosclerosis of aorta: Secondary | ICD-10-CM | POA: Insufficient documentation

## 2017-05-25 HISTORY — DX: Malignant (primary) neoplasm, unspecified: C80.1

## 2017-05-25 MED ORDER — IOPAMIDOL (ISOVUE-300) INJECTION 61%
100.0000 mL | Freq: Once | INTRAVENOUS | Status: AC | PRN
  Administered 2017-05-25: 100 mL via INTRAVENOUS

## 2017-05-25 MED ORDER — IOPAMIDOL (ISOVUE-300) INJECTION 61%
INTRAVENOUS | Status: AC
  Filled 2017-05-25: qty 100

## 2017-05-25 NOTE — Progress Notes (Signed)
Pt here for patient teaching.  Pt given Radiation and You booklet and skin care instructions.  Reviewed areas of pertinence such as diarrhea, fatigue, nausea and vomiting, skin changes, throat changes and cough . Pt able to give teach back of to pat skin and use unscented/gentle soap,. Pt of information given and will contact nursing with any questions or concerns.     [email or web address]

## 2017-05-26 ENCOUNTER — Ambulatory Visit (HOSPITAL_COMMUNITY)
Admission: RE | Admit: 2017-05-26 | Discharge: 2017-05-26 | Disposition: A | Discharge status: Home / Self Care | Payer: BLUE CROSS/BLUE SHIELD | Source: Ambulatory Visit | Attending: Hematology & Oncology | Admitting: Hematology & Oncology

## 2017-05-26 ENCOUNTER — Ambulatory Visit
Admission: RE | Admit: 2017-05-26 | Discharge: 2017-05-26 | Disposition: A | Discharge status: Home / Self Care | Payer: BLUE CROSS/BLUE SHIELD | Source: Ambulatory Visit | Attending: Radiation Oncology | Admitting: Radiation Oncology

## 2017-05-26 DIAGNOSIS — C7951 Secondary malignant neoplasm of bone: Secondary | ICD-10-CM | POA: Insufficient documentation

## 2017-05-26 DIAGNOSIS — K449 Diaphragmatic hernia without obstruction or gangrene: Secondary | ICD-10-CM | POA: Diagnosis not present

## 2017-05-26 DIAGNOSIS — C419 Malignant neoplasm of bone and articular cartilage, unspecified: Secondary | ICD-10-CM | POA: Diagnosis present

## 2017-05-26 DIAGNOSIS — J984 Other disorders of lung: Secondary | ICD-10-CM | POA: Diagnosis not present

## 2017-05-26 LAB — GLUCOSE, CAPILLARY: Glucose-Capillary: 194 mg/dL — ABNORMAL HIGH (ref 65–99)

## 2017-05-26 MED ORDER — FLUDEOXYGLUCOSE F - 18 (FDG) INJECTION
7.8400 | Freq: Once | INTRAVENOUS | Status: AC | PRN
  Administered 2017-05-26: 7.84 via INTRAVENOUS

## 2017-05-27 ENCOUNTER — Ambulatory Visit
Admission: RE | Admit: 2017-05-27 | Discharge: 2017-05-27 | Disposition: A | Discharge status: Home / Self Care | Payer: BLUE CROSS/BLUE SHIELD | Source: Ambulatory Visit | Attending: Radiation Oncology | Admitting: Radiation Oncology

## 2017-05-27 ENCOUNTER — Other Ambulatory Visit: Payer: Self-pay | Admitting: *Deleted

## 2017-05-27 DIAGNOSIS — C7951 Secondary malignant neoplasm of bone: Secondary | ICD-10-CM | POA: Diagnosis not present

## 2017-05-27 DIAGNOSIS — C801 Malignant (primary) neoplasm, unspecified: Secondary | ICD-10-CM

## 2017-05-27 MED ORDER — OXYCODONE HCL 10 MG PO TABS: 10.0000 mg | ORAL_TABLET | ORAL | 0 refills | Status: DC | PRN

## 2017-05-28 ENCOUNTER — Ambulatory Visit
Admission: RE | Admit: 2017-05-28 | Discharge: 2017-05-28 | Disposition: A | Discharge status: Home / Self Care | Payer: BLUE CROSS/BLUE SHIELD | Source: Ambulatory Visit | Attending: Radiation Oncology | Admitting: Radiation Oncology

## 2017-05-28 ENCOUNTER — Other Ambulatory Visit: Payer: Self-pay | Admitting: Hematology & Oncology

## 2017-05-28 DIAGNOSIS — C7951 Secondary malignant neoplasm of bone: Secondary | ICD-10-CM | POA: Diagnosis not present

## 2017-05-31 ENCOUNTER — Ambulatory Visit: Payer: BLUE CROSS/BLUE SHIELD

## 2017-05-31 ENCOUNTER — Telehealth: Payer: Self-pay | Admitting: Oncology

## 2017-05-31 NOTE — Telephone Encounter (Signed)
Patient called and canceled radiation today because she thinks she has the flu with nausea and diarrhea.  Advised her that radiation may cause her to have diarrhea and that she can take Imodium over the counter.  She verbalized agreement.  Notified [facility] that patient is canceled today.

## 2017-06-01 ENCOUNTER — Ambulatory Visit
Admission: RE | Admit: 2017-06-01 | Discharge: 2017-06-01 | Disposition: A | Discharge status: Home / Self Care | Payer: BLUE CROSS/BLUE SHIELD | Source: Ambulatory Visit | Attending: Radiation Oncology | Admitting: Radiation Oncology

## 2017-06-01 ENCOUNTER — Other Ambulatory Visit: Payer: Self-pay

## 2017-06-01 ENCOUNTER — Inpatient Hospital Stay: Payer: BLUE CROSS/BLUE SHIELD

## 2017-06-01 ENCOUNTER — Other Ambulatory Visit: Payer: Self-pay | Admitting: Family

## 2017-06-01 ENCOUNTER — Inpatient Hospital Stay (HOSPITAL_BASED_OUTPATIENT_CLINIC_OR_DEPARTMENT_OTHER): Payer: BLUE CROSS/BLUE SHIELD | Admitting: Hematology & Oncology

## 2017-06-01 ENCOUNTER — Encounter: Payer: Self-pay | Admitting: Hematology & Oncology

## 2017-06-01 DIAGNOSIS — R300 Dysuria: Secondary | ICD-10-CM

## 2017-06-01 DIAGNOSIS — R918 Other nonspecific abnormal finding of lung field: Secondary | ICD-10-CM

## 2017-06-01 DIAGNOSIS — C3491 Malignant neoplasm of unspecified part of right bronchus or lung: Secondary | ICD-10-CM

## 2017-06-01 DIAGNOSIS — R52 Pain, unspecified: Secondary | ICD-10-CM

## 2017-06-01 DIAGNOSIS — C801 Malignant (primary) neoplasm, unspecified: Secondary | ICD-10-CM | POA: Diagnosis not present

## 2017-06-01 DIAGNOSIS — R634 Abnormal weight loss: Secondary | ICD-10-CM | POA: Diagnosis not present

## 2017-06-01 DIAGNOSIS — C3490 Malignant neoplasm of unspecified part of unspecified bronchus or lung: Secondary | ICD-10-CM | POA: Insufficient documentation

## 2017-06-01 DIAGNOSIS — C7951 Secondary malignant neoplasm of bone: Secondary | ICD-10-CM | POA: Diagnosis not present

## 2017-06-01 DIAGNOSIS — Z720 Tobacco use: Secondary | ICD-10-CM

## 2017-06-01 HISTORY — DX: Malignant neoplasm of unspecified part of right bronchus or lung: C34.91

## 2017-06-01 LAB — CBC WITH DIFFERENTIAL (CANCER CENTER ONLY)
Basophils Absolute: 0 10*3/uL (ref 0.0–0.1)
Basophils Relative: 0 %
Eosinophils Absolute: 0.1 10*3/uL (ref 0.0–0.5)
Eosinophils Relative: 3 %
HEMATOCRIT: 31.2 % — AB (ref 34.8–46.6)
HEMOGLOBIN: 10.5 g/dL — AB (ref 11.6–15.9)
LYMPHS ABS: 0.4 10*3/uL — AB (ref 0.9–3.3)
LYMPHS PCT: 10 %
MCH: 28.8 pg (ref 25.1–34.0)
MCHC: 33.7 g/dL (ref 31.5–36.0)
MCV: 85.5 fL (ref 79.5–101.0)
MONOS PCT: 12 %
Monocytes Absolute: 0.5 10*3/uL (ref 0.1–0.9)
NEUTROS ABS: 3.1 10*3/uL (ref 1.5–6.5)
NEUTROS PCT: 75 %
Platelet Count: 143 10*3/uL — ABNORMAL LOW (ref 145–400)
RBC: 3.65 MIL/uL — AB (ref 3.70–5.45)
RDW: 14.8 % — ABNORMAL HIGH (ref 11.2–14.5)
WBC: 4.2 10*3/uL (ref 3.9–10.3)

## 2017-06-01 LAB — URINALYSIS, COMPLETE (UACMP) WITH MICROSCOPIC
Bilirubin Urine: NEGATIVE
Glucose, UA: NEGATIVE mg/dL
Ketones, ur: 20 mg/dL — AB
Leukocytes, UA: NEGATIVE
Nitrite: NEGATIVE
PH: 5 (ref 5.0–8.0)
PROTEIN: 30 mg/dL — AB
Specific Gravity, Urine: 1.024 (ref 1.005–1.030)

## 2017-06-01 LAB — CMP (CANCER CENTER ONLY)
ALK PHOS: 154 U/L — AB (ref 40–150)
ALT: 9 U/L (ref 0–55)
AST: 17 U/L (ref 5–34)
Albumin: 2.3 g/dL — ABNORMAL LOW (ref 3.5–5.0)
Anion gap: 10 (ref 3–11)
BILIRUBIN TOTAL: 0.5 mg/dL (ref 0.2–1.2)
BUN: 14 mg/dL (ref 7–26)
CALCIUM: 8.5 mg/dL (ref 8.4–10.4)
CO2: 24 mmol/L (ref 22–29)
CREATININE: 0.66 mg/dL (ref 0.60–1.10)
Chloride: 91 mmol/L — ABNORMAL LOW (ref 98–109)
Glucose, Bld: 108 mg/dL (ref 70–140)
Potassium: 3.8 mmol/L (ref 3.5–5.1)
SODIUM: 125 mmol/L — AB (ref 136–145)
TOTAL PROTEIN: 6.4 g/dL (ref 6.4–8.3)

## 2017-06-01 LAB — TSH: TSH: 1.598 u[IU]/mL (ref 0.308–3.960)

## 2017-06-01 LAB — LACTATE DEHYDROGENASE: LDH: 219 U/L (ref 125–245)

## 2017-06-01 MED ORDER — OSELTAMIVIR PHOSPHATE 75 MG PO CAPS
75.0000 mg | ORAL_CAPSULE | Freq: Two times a day (BID) | ORAL | 0 refills | Status: DC
Start: 2017-06-01 — End: 2017-06-09

## 2017-06-01 MED ORDER — CYANOCOBALAMIN 1000 MCG/ML IJ SOLN
INTRAMUSCULAR | Status: AC
Start: 2017-06-01 — End: ?
  Filled 2017-06-01: qty 1

## 2017-06-01 MED ORDER — CYANOCOBALAMIN 1000 MCG/ML IJ SOLN
1000.0000 ug | Freq: Once | INTRAMUSCULAR | Status: AC
  Administered 2017-06-01: 1000 ug via INTRAMUSCULAR

## 2017-06-01 NOTE — Progress Notes (Signed)
Patient complains of generalized muscle aching since Friday. Patient states she has had approximately 10 episodes of diarrhea since yesterday. Patient states she did not check her temperature at home as she does have a thermometer at home. Patient states she has had chills since yesterday.

## 2017-06-01 NOTE — Patient Instructions (Signed)

## 2017-06-01 NOTE — Progress Notes (Signed)
START ON PATHWAY REGIMEN - Non-Small Cell Lung     A cycle is every 21 days:     Pembrolizumab      Pemetrexed      Carboplatin   **Always confirm dose/schedule in your pharmacy ordering system**    Patient Characteristics: Stage IV Metastatic, Nonsquamous, Initial Chemotherapy/Immunotherapy, PS = 0, 1, PD-L1 Expression Positive 1-49% (TPS) / Negative / Not Tested / Awaiting Test Results AJCC T Category: T2 Current Disease Status: Distant Metastases AJCC N Category: N2 AJCC M Category: M1c AJCC 8 Stage Grouping: IVB Histology: Nonsquamous Cell ROS1 Rearrangement Status: Awaiting Test Results T790M Mutation Status: Not Applicable - EGFR Mutation Negative/Unknown Other Mutations/Biomarkers: No Other Actionable Mutations PD-L1 Expression Status: Awaiting Test Results Chemotherapy/Immunotherapy LOT: Initial Chemotherapy/Immunotherapy Molecular Targeted Therapy: Not Appropriate ALK Translocation Status: Awaiting Test Results Would you be surprised if this patient died  in the next year<= I would be surprised if this patient died in the next year EGFR Mutation Status: Awaiting Test Results BRAF V600E Mutation Status: Did Not Order Test Performance Status: PS = 0, 1 Intent of Therapy: Non-Curative / Palliative Intent, Discussed with Patient

## 2017-06-02 ENCOUNTER — Other Ambulatory Visit: Payer: Self-pay | Admitting: Radiation Oncology

## 2017-06-02 ENCOUNTER — Encounter: Payer: Self-pay | Admitting: Radiation Oncology

## 2017-06-02 ENCOUNTER — Ambulatory Visit (HOSPITAL_COMMUNITY)
Admission: RE | Admit: 2017-06-02 | Discharge: 2017-06-02 | Disposition: A | Discharge status: Home / Self Care | Payer: BLUE CROSS/BLUE SHIELD | Source: Ambulatory Visit | Attending: Radiation Oncology | Admitting: Radiation Oncology

## 2017-06-02 ENCOUNTER — Ambulatory Visit
Admission: RE | Admit: 2017-06-02 | Discharge: 2017-06-02 | Disposition: A | Discharge status: Home / Self Care | Payer: BLUE CROSS/BLUE SHIELD | Source: Ambulatory Visit | Attending: Radiation Oncology | Admitting: Radiation Oncology

## 2017-06-02 ENCOUNTER — Telehealth: Payer: Self-pay | Admitting: Radiation Oncology

## 2017-06-02 DIAGNOSIS — I700 Atherosclerosis of aorta: Secondary | ICD-10-CM | POA: Insufficient documentation

## 2017-06-02 DIAGNOSIS — Z9049 Acquired absence of other specified parts of digestive tract: Secondary | ICD-10-CM | POA: Insufficient documentation

## 2017-06-02 DIAGNOSIS — R109 Unspecified abdominal pain: Secondary | ICD-10-CM | POA: Insufficient documentation

## 2017-06-02 DIAGNOSIS — R1010 Upper abdominal pain, unspecified: Secondary | ICD-10-CM

## 2017-06-02 DIAGNOSIS — K449 Diaphragmatic hernia without obstruction or gangrene: Secondary | ICD-10-CM | POA: Insufficient documentation

## 2017-06-02 DIAGNOSIS — R1011 Right upper quadrant pain: Secondary | ICD-10-CM

## 2017-06-02 DIAGNOSIS — R1012 Left upper quadrant pain: Secondary | ICD-10-CM | POA: Insufficient documentation

## 2017-06-02 DIAGNOSIS — C7951 Secondary malignant neoplasm of bone: Secondary | ICD-10-CM | POA: Diagnosis not present

## 2017-06-02 DIAGNOSIS — K861 Other chronic pancreatitis: Secondary | ICD-10-CM | POA: Diagnosis not present

## 2017-06-02 HISTORY — DX: Unspecified abdominal pain: R10.9

## 2017-06-02 LAB — URINE CULTURE: Culture: NO GROWTH

## 2017-06-02 MED ORDER — IOPAMIDOL (ISOVUE-300) INJECTION 61%
INTRAVENOUS | Status: AC
  Filled 2017-06-02: qty 100

## 2017-06-02 MED ORDER — IOPAMIDOL (ISOVUE-300) INJECTION 61%
INTRAVENOUS | Status: AC
  Filled 2017-06-02: qty 30

## 2017-06-02 MED ORDER — IOPAMIDOL (ISOVUE-300) INJECTION 61%
100.0000 mL | Freq: Once | INTRAVENOUS | Status: AC | PRN
  Administered 2017-06-02: 100 mL via INTRAVENOUS

## 2017-06-02 MED ORDER — AMOXICILLIN-POT CLAVULANATE 875-125 MG PO TABS: 1.0000 | ORAL_TABLET | Freq: Two times a day (BID) | ORAL | 0 refills | Status: DC

## 2017-06-02 MED ORDER — IOPAMIDOL (ISOVUE-300) INJECTION 61%
30.0000 mL | Freq: Once | INTRAVENOUS | Status: DC | PRN
  Administered 2017-06-02: 30 mL via ORAL
  Filled 2017-06-02: qty 30

## 2017-06-02 NOTE — Telephone Encounter (Signed)
I spoke with the patient to let her know her CT revealed concerns for mild diverticulitis. She is counseled on abx therapy and is going to purchase a thermometer to check her temperature as well. She will let us know how she's doing overall over the next few days.

## 2017-06-02 NOTE — Progress Notes (Signed)
Hematology and Oncology Follow Up Visit  [name] [id number] 01-Jun-1955 62 y.o. 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  We have done her workup.  We did a PET scan on her.  The PET scan showed widespread skeletal metastasis.  She has a metabolic lesion in the right upper lobe.  No other areas of disease are noted.  She is undergoing radiation therapy right now.  This is helping her.  She has another  week to go.  Unfortunately, because this was a bone biopsy, we cannot run the molecular markers to see if there is a molecular mutation for [place] to target.  She is still having pain.  The pain however seems to be doing a little bit better.  She has lost a little bit of weight.  Her appetite has not been as good because of discomfort.  She is having no headache.  She is having no blurred vision.  I do need to get an MRI of the brain to make sure there is no brain metastasis.  There is no bleeding.  She has had no fever.  She is quite anxious, which is definitely understanding.  I do have her on some Decadron to try to help with some of the bone inflammation.  This is helping a little bit.  Overall, I would say that her performance status is ECOG1.  Medications:  Current Outpatient Medications:  .  Calcium Carbonate-Vitamin D (CALCIUM 600+D PO), Take 1 tablet by mouth daily., Disp: , Rfl:  .  dexamethasone (DECADRON) 6 MG tablet, Take 5 pills today with food, then 3 pills a day with food., Disp: 90 tablet, Rfl: 4 .  docusate sodium (COLACE) 250 MG capsule, Take 1 capsule (250 mg total) by mouth daily. While taking pain medication, Disp: 30 capsule, Rfl: 0 .  EQ NICOTINE 14 MG/24HR patch, , Disp:  , Rfl: 0 .  fentaNYL (DURAGESIC - DOSED MCG/HR) 75 MCG/HR, APPLY 1 PATCH TOPICALLY EVERY 72 HOURS, Disp: 10 patch, Rfl: 0 .  loperamide (IMODIUM A-D) 2 MG tablet, Take 2 mg by mouth as needed for diarrhea or loose stools., Disp: , Rfl:  .  LORazepam (ATIVAN) 1 MG tablet, TAKE 1 TABLET BY MOUTH THREE TIMES DAILY AS NEEDED FOR ANXIETY, Disp: 60 tablet, Rfl: 0 .  Multiple Vitamin (MULTIVITAMIN WITH MINERALS) TABS tablet, Take 1 tablet by mouth daily., Disp: , Rfl:  .  Oxycodone HCl 10 MG TABS, Take 1 tablet (10 mg total) by mouth every 4 (four) hours as needed., Disp: 120 tablet, Rfl: 0 .  prochlorperazine (COMPAZINE) 10 MG tablet, Take 1 tablet (10 mg total) by mouth every 6 (six) hours as needed for nausea or vomiting., Disp: 30 tablet, Rfl: 1 .  valACYclovir (VALTREX) 1000 MG tablet, Take 1,000 mg by mouth daily as needed (fever blisters). , Disp: , Rfl:  .  amoxicillin-clavulanate (AUGMENTIN) 875-125 MG tablet, Take 1 tablet by mouth 2 (two) times daily., Disp: 20 tablet, Rfl: 0 .  oseltamivir (TAMIFLU) 75 MG capsule, Take 1 capsule (75 mg total) by mouth 2 (two) times daily., Disp: 14 capsule, Rfl: 0 No current facility-administered medications for this visit.   Facility-Administered Medications Ordered in Other Visits:  .  iopamidol (  ISOVUE-300) 61 % injection 30 mL, 30 mL, Oral, Once PRN, [name] Pedro, PA-C, 30 mL at 06/02/17 1314 .  iopamidol (ISOVUE-300) 61 % injection, , , ,  .  iopamidol (ISOVUE-300) 61 % injection, , , ,   Allergies:  Allergies  Allergen Reactions  . Ciprofloxacin Nausea Only    Past Medical History, Surgical history, Social history, and Family History were reviewed and updated.  Review of Systems: Review of Systems  Constitutional: Positive for fatigue.  HENT:  Negative.   Eyes: Negative.   Respiratory: Negative.   Cardiovascular: Negative.   Gastrointestinal: Positive for constipation and nausea.  Endocrine: Negative.   Genitourinary: Negative.     Musculoskeletal: Positive for arthralgias, back pain and myalgias.  Skin: Negative.   Neurological: Positive for light-headedness.  Hematological: Negative.   Psychiatric/Behavioral: The patient is nervous/anxious.     Physical Exam:  weight is 157 lb 6 oz (71.4 kg). Her oral temperature is 98.2 F (36.8 C). Her blood pressure is 124/57 (abnormal) and her pulse is 99. Her respiration is 18 and oxygen saturation is 99%.   Wt Readings from Last 3 Encounters:  06/01/17 157 lb 6 oz (71.4 kg)  05/13/17 158 lb 6.4 oz (71.8 kg)  05/10/17 156 lb (70.8 kg)    Physical Exam  Constitutional: She is oriented to person, place, and time.  HENT:  Head: Normocephalic and atraumatic.  Mouth/Throat: Oropharynx is clear and moist.  Eyes: EOM are normal. Pupils are equal, round, and reactive to light.  Neck: Normal range of motion.  Cardiovascular: Normal rate, regular rhythm and normal heart sounds.  Pulmonary/Chest: Effort normal and breath sounds normal.  Abdominal: Soft. Bowel sounds are normal.  Musculoskeletal: Normal range of motion. She exhibits no edema, tenderness or deformity.  Lymphadenopathy:    She has no cervical adenopathy.  Neurological: She is alert and oriented to person, place, and time.  Skin: Skin is warm and dry. No rash noted. No erythema.  Psychiatric: She has a normal mood and affect. Her behavior is normal. Judgment and thought content normal.  Vitals reviewed.    Lab Results  Component Value Date   WBC 4.2 06/01/2017   HGB 11.1 (L) 05/06/2017   HCT 31.2 (L) 06/01/2017   MCV 85.5 06/01/2017   PLT 143 (L) 06/01/2017     Chemistry      Component Value Date/Time   NA 125 (L) 06/01/2017 1304   K 3.8 06/01/2017 1304   CL 91 (L) 06/01/2017 1304   CO2 24 06/01/2017 1304   BUN 14 06/01/2017 1304   CREATININE 0.66 06/01/2017 1304   CREATININE 0.63 09/21/2014 1206      Component Value Date/Time   CALCIUM 8.5 06/01/2017 1304   ALKPHOS 154 (H) 06/01/2017 1304     AST 17 06/01/2017 1304   ALT 9 06/01/2017 1304   BILITOT 0.5 06/01/2017 1304       Impression and Plan: [name] is a 62 year old white female.  She is a smoker.  She has metastatic adenocarcinoma.  By the clinical workup, I have to believe that this is a lung cancer.  I see nothing on her scans that would suggest a GI etiology.  There is nothing with her breast exam that would suggest metastatic breast cancer.  I talked to she and her husband for about an hour.  Over 50% of the time was spent face-to-face with them.  I reviewed her scans.  I reviewed the pathology.  I answered their questions.  Our best option right now is systemic chemo immunotherapy.  I think she could tolerate this.  She will need to have a Port-A-Cath placed.  I would favor using carboplatinum/Alimta and then use pembrolizumab as immunotherapy.  I would like to hope that the response rate to this protocol will be over 60%.  They know full well that we are dealing with stage IV disease.  They know that this is something that we can treat but not cure.  Our goal of care is definitely quality of life and trying to improve her performance status so that she can enjoy her life and her family.  I gave him information sheets about each chemotherapy agent.  I went over the side effects that she might experience.  We will not start until after she finishes her radiation therapy.  I would like to get started on February 18.  I would use 2 cycles of treatment and then repeat her PET scan to see how everything looks.  I know that she is trying hard.  I told her to try to have small frequent meals.   Volanda Napoleon, MD 2/13/20195:12 PM

## 2017-06-02 NOTE — Progress Notes (Signed)
[name] is a pleasant 62 y.o. female with a recently diagnosed adenocarcinoma of unknown primary versus NSCLC who is currently receiving radiotherapy for sacral metastasis under the care of Dr. Sondra Come. She has developed diarrhea over the last week and has been receiving radiation and has had 5/10 fractions. She reports since last weekend she's felt poorly overall. She thought she had flu and reports that she's had all over body aches, and noted several weeks of night sweats and chills. She was seen yesterday and had CBC with diff and CMP. No elevation in WBC or BUN/creatinine. She did have a temperature of 100.1 F in the clinic yesterday. She reports today she developed acute onset of abdominal pain while waiting for radiation treatment, and during her cone beam CT to set her up for therapy, the therapists were concerned by possible "free air" in her abdomen. In reviewing these films with Dr. Lisbeth Renshaw there is not diagnostic quality of our imaging and we can see evidence of gas in the transverse colon but no specific focal findings otherwise.   PE: Wt Readings from Last 3 Encounters:  06/01/17 157 lb 6 oz (71.4 kg)  05/13/17 158 lb 6.4 oz (71.8 kg)  05/10/17 156 lb (70.8 kg)   Temp Readings from Last 3 Encounters:  06/02/17 99.4 F (37.4 C) (Oral)  06/01/17 98.2 F (36.8 C) (Oral)  05/21/17 98.4 F (36.9 C) (Oral)   BP Readings from Last 3 Encounters:  06/02/17 (!) 117/56  06/01/17 (!) 124/57  05/21/17 (!) 138/58   Pulse Readings from Last 3 Encounters:  06/02/17 94  06/01/17 99  05/21/17 82   In general this is a well appearing caucasian female in no acute distress. She is alert and oriented x4 and appropriate throughout the examination. HEENT reveals that the patient is normocephalic, atraumatic. EOMs are intact. PERRLA. Skin is intact without any evidence of gross lesions. Cardiovascular exam reveals a regular rate and rhythm, no clicks rubs or murmurs are auscultated. Chest is  clear to auscultation bilaterally. Lymphatic assessment is performed and does not reveal any adenopathy in the cervical, supraclavicular, axillary, or inguinal chains. Abdomen has hyperactive bowel sounds in the upper quadrants. She has a well healed midline laparotomy incision from prior hernia repair. The abdomen is distended and tympanic to percussion in the upper quadrants. The abdomen is sore to deep palpation but is negative for peritoneal signs such as guarding or rebound.   Lower extremities are negative for pretibial pitting edema, deep calf tenderness, cyanosis or clubbing.  Impression/Plan: 1. Abdominal pain in the setting of possible free air. I discussed with the patient the limitations of our imaging in our department, and would recommend a diagnostic image to evaluate for free air and would recommend stat CT abdomen/pelvis with contrast. We discussed the potential of diverticulitis versus perforation. Although she does not appear toxic, I think it would be very important to rule these out. She does understand that if we did find an acute process, we would ask her to proceed to the ER for surgical evaluation. She is in agreement with this process and I will call her once we have definitive findings. If this is truly just retained gas, we discussed other medication options to consider. Her radiotherapy regimen will depend on the findings moving forward.     Carola Rhine, PAC

## 2017-06-03 ENCOUNTER — Other Ambulatory Visit: Payer: Self-pay | Admitting: *Deleted

## 2017-06-03 ENCOUNTER — Ambulatory Visit
Admission: RE | Admit: 2017-06-03 | Discharge: 2017-06-03 | Disposition: A | Discharge status: Home / Self Care | Payer: BLUE CROSS/BLUE SHIELD | Source: Ambulatory Visit | Attending: Radiation Oncology | Admitting: Radiation Oncology

## 2017-06-03 DIAGNOSIS — C7951 Secondary malignant neoplasm of bone: Secondary | ICD-10-CM | POA: Diagnosis not present

## 2017-06-03 MED ORDER — LORAZEPAM 1 MG PO TABS: 1.0000 mg | ORAL_TABLET | Freq: Three times a day (TID) | ORAL | 0 refills | Status: AC | PRN

## 2017-06-04 ENCOUNTER — Other Ambulatory Visit: Payer: Self-pay | Admitting: Urology

## 2017-06-04 ENCOUNTER — Inpatient Hospital Stay: Payer: BLUE CROSS/BLUE SHIELD

## 2017-06-04 ENCOUNTER — Ambulatory Visit
Admission: RE | Admit: 2017-06-04 | Discharge: 2017-06-04 | Disposition: A | Discharge status: Home / Self Care | Payer: BLUE CROSS/BLUE SHIELD | Source: Ambulatory Visit | Attending: Radiation Oncology | Admitting: Radiation Oncology

## 2017-06-04 DIAGNOSIS — C7951 Secondary malignant neoplasm of bone: Secondary | ICD-10-CM | POA: Diagnosis not present

## 2017-06-04 MED ORDER — VALACYCLOVIR HCL 1 G PO TABS: 1000.0000 mg | ORAL_TABLET | Freq: Every day | ORAL | 0 refills | Status: AC | PRN

## 2017-06-04 NOTE — Progress Notes (Signed)
Patient has completed 11 fractions to her t/l spine. She reports having diarrhea several times in the afternoons. She is taking 4 Imodium per day and was advised that she can take up to 8 a day. Also reports having a poor appetite. She also mentioned having swelling in her ankles and a rash across her buttocks. The rash is red with scabbing. She reports that it started a month ago and has progressively gotten worse. She thinks she has taken valtrex in the past. She is also asking for a refill on lorazepam for anxiet. She also has an elevated temperature of 100.1.

## 2017-06-05 ENCOUNTER — Ambulatory Visit (HOSPITAL_BASED_OUTPATIENT_CLINIC_OR_DEPARTMENT_OTHER)
Admission: RE | Admit: 2017-06-05 | Discharge: 2017-06-05 | Disposition: A | Discharge status: Home / Self Care | Payer: BLUE CROSS/BLUE SHIELD | Source: Ambulatory Visit | Attending: Hematology & Oncology | Admitting: Hematology & Oncology

## 2017-06-05 ENCOUNTER — Other Ambulatory Visit: Payer: Self-pay | Admitting: Hematology & Oncology

## 2017-06-05 ENCOUNTER — Ambulatory Visit (HOSPITAL_BASED_OUTPATIENT_CLINIC_OR_DEPARTMENT_OTHER)
Admission: RE | Admit: 2017-06-05 | Discharge: 2017-06-05 | Disposition: A | Discharge status: Home / Self Care | Payer: BLUE CROSS/BLUE SHIELD | Source: Ambulatory Visit | Attending: Family | Admitting: Family

## 2017-06-05 DIAGNOSIS — C419 Malignant neoplasm of bone and articular cartilage, unspecified: Secondary | ICD-10-CM

## 2017-06-05 DIAGNOSIS — C3491 Malignant neoplasm of unspecified part of right bronchus or lung: Secondary | ICD-10-CM | POA: Diagnosis present

## 2017-06-05 DIAGNOSIS — Z1231 Encounter for screening mammogram for malignant neoplasm of breast: Secondary | ICD-10-CM | POA: Diagnosis not present

## 2017-06-05 MED ORDER — GADOBENATE DIMEGLUMINE 529 MG/ML IV SOLN
15.0000 mL | Freq: Once | INTRAVENOUS | Status: AC | PRN
  Administered 2017-06-05: 13 mL via INTRAVENOUS

## 2017-06-07 ENCOUNTER — Ambulatory Visit
Admission: RE | Admit: 2017-06-07 | Discharge: 2017-06-07 | Disposition: A | Discharge status: Home / Self Care | Payer: BLUE CROSS/BLUE SHIELD | Source: Ambulatory Visit | Attending: Radiation Oncology | Admitting: Radiation Oncology

## 2017-06-07 ENCOUNTER — Other Ambulatory Visit: Payer: Self-pay | Admitting: Radiation Oncology

## 2017-06-07 ENCOUNTER — Telehealth: Payer: Self-pay | Admitting: *Deleted

## 2017-06-07 ENCOUNTER — Telehealth: Payer: Self-pay | Admitting: Oncology

## 2017-06-07 ENCOUNTER — Telehealth: Payer: Self-pay

## 2017-06-07 ENCOUNTER — Telehealth: Payer: Self-pay | Admitting: Medical Oncology

## 2017-06-07 DIAGNOSIS — C7951 Secondary malignant neoplasm of bone: Secondary | ICD-10-CM | POA: Diagnosis not present

## 2017-06-07 LAB — CBC WITH DIFFERENTIAL (CANCER CENTER ONLY)
Basophils Absolute: 0 10*3/uL (ref 0.0–0.1)
Basophils Relative: 0 %
EOS ABS: 0 10*3/uL (ref 0.0–0.5)
EOS PCT: 0 %
HCT: 30.4 % — ABNORMAL LOW (ref 34.8–46.6)
HEMOGLOBIN: 10 g/dL — AB (ref 11.6–15.9)
LYMPHS ABS: 0.3 10*3/uL — AB (ref 0.9–3.3)
Lymphocytes Relative: 2 %
MCH: 28.3 pg (ref 25.1–34.0)
MCHC: 32.9 g/dL (ref 31.5–36.0)
MCV: 86.1 fL (ref 79.5–101.0)
MONOS PCT: 7 %
Monocytes Absolute: 1 10*3/uL — ABNORMAL HIGH (ref 0.1–0.9)
Neutro Abs: 12 10*3/uL — ABNORMAL HIGH (ref 1.5–6.5)
Neutrophils Relative %: 91 %
PLATELETS: 91 10*3/uL — AB (ref 145–400)
RBC: 3.53 MIL/uL — ABNORMAL LOW (ref 3.70–5.45)
RDW: 15.3 % — ABNORMAL HIGH (ref 11.2–14.5)
WBC Count: 13.4 10*3/uL — ABNORMAL HIGH (ref 3.9–10.3)

## 2017-06-07 LAB — BASIC METABOLIC PANEL - CANCER CENTER ONLY
Anion gap: 12 — ABNORMAL HIGH (ref 3–11)
BUN: 9 mg/dL (ref 7–26)
CHLORIDE: 96 mmol/L — AB (ref 98–109)
CO2: 21 mmol/L — ABNORMAL LOW (ref 22–29)
CREATININE: 0.66 mg/dL (ref 0.60–1.10)
Calcium: 8.7 mg/dL (ref 8.4–10.4)
GFR, Est AFR Am: >60 mL/min (ref >60)
GFR, Estimated: >60 mL/min (ref >60)
GLUCOSE: 164 mg/dL — AB (ref 70–140)
Potassium: 3.8 mmol/L (ref 3.5–5.1)
SODIUM: 129 mmol/L — AB (ref 136–145)

## 2017-06-07 LAB — MAGNESIUM: MAGNESIUM: 2.2 mg/dL (ref 1.5–2.5)

## 2017-06-07 MED ORDER — FENTANYL 75 MCG/HR TD PT72: MEDICATED_PATCH | TRANSDERMAL | 0 refills | Status: DC

## 2017-06-07 MED ORDER — DIPHENOXYLATE-ATROPINE 2.5-0.025 MG PO TABS: 2.0000 | ORAL_TABLET | Freq: Four times a day (QID) | ORAL | 0 refills | Status: AC | PRN

## 2017-06-07 MED ORDER — FUROSEMIDE 20 MG PO TABS: 20.0000 mg | ORAL_TABLET | Freq: Every day | ORAL | 0 refills | Status: DC

## 2017-06-07 MED FILL — FUROSEMIDE 20 MG TABS: 20 | 10 days supply | Qty: 10 | Fill #0

## 2017-06-07 MED FILL — DIPHENOXYLATE-ATROP 2.5-0.0: 2.5-0.025 | 4 days supply | Qty: 30 | Fill #0

## 2017-06-07 NOTE — Telephone Encounter (Signed)
Called [facility] and they scheduled patient lab. Per 2/18 los

## 2017-06-07 NOTE — Telephone Encounter (Signed)
asking about new medications and that they have not been called in . Transferred call to xrt.

## 2017-06-07 NOTE — Telephone Encounter (Signed)
Left a message for patient advising her that the pain medication and lomotil prescriptions have been sent to [facility].  Also asked if she could call back regarding having labs drawn.

## 2017-06-07 NOTE — Telephone Encounter (Signed)
Patient c/o severe pain all over. She also states she is having BLE edema where her feet are "unrecognizable". She'd like something for symptom relief.  Reviewed symptoms with Dr Marin Olp. Patient has an appointment today at 11:30 for radiation. He would like patient to discuss symptoms when assessed and see if they can provide any interventions. If they opt not to intervene, patient can call this office back and we will see about working her in this afternoon.  Patient is aware of instruction.

## 2017-06-08 ENCOUNTER — Ambulatory Visit: Payer: BLUE CROSS/BLUE SHIELD

## 2017-06-08 ENCOUNTER — Other Ambulatory Visit: Payer: Self-pay | Admitting: Radiology

## 2017-06-08 ENCOUNTER — Other Ambulatory Visit: Payer: Self-pay | Admitting: *Deleted

## 2017-06-08 ENCOUNTER — Ambulatory Visit
Admission: RE | Admit: 2017-06-08 | Discharge: 2017-06-08 | Disposition: A | Discharge status: Home / Self Care | Payer: BLUE CROSS/BLUE SHIELD | Source: Ambulatory Visit | Attending: Radiation Oncology | Admitting: Radiation Oncology

## 2017-06-08 ENCOUNTER — Other Ambulatory Visit: Payer: Self-pay | Admitting: Hematology & Oncology

## 2017-06-08 ENCOUNTER — Telehealth: Payer: Self-pay | Admitting: *Deleted

## 2017-06-08 DIAGNOSIS — C7951 Secondary malignant neoplasm of bone: Secondary | ICD-10-CM | POA: Diagnosis not present

## 2017-06-08 DIAGNOSIS — C801 Malignant (primary) neoplasm, unspecified: Secondary | ICD-10-CM

## 2017-06-08 MED ORDER — OXYCODONE HCL 20 MG PO TABS: 20.0000 mg | ORAL_TABLET | Freq: Four times a day (QID) | ORAL | 0 refills | Status: DC | PRN

## 2017-06-08 MED FILL — oxyCODONE HCL 20 MG TABS: 20 | 30 days supply | Qty: 120 | Fill #0

## 2017-06-08 NOTE — Telephone Encounter (Addendum)
Patient is aware of results  ----- Message from Volanda Napoleon, MD sent at 06/07/2017  6:17 AM EST ----- Call - MRI of the brain does NOT show any mets!! pete

## 2017-06-09 ENCOUNTER — Inpatient Hospital Stay: Admission: RE | Admit: 2017-06-09 | Payer: BLUE CROSS/BLUE SHIELD | Source: Ambulatory Visit

## 2017-06-09 ENCOUNTER — Ambulatory Visit (HOSPITAL_COMMUNITY)
Admission: RE | Admit: 2017-06-09 | Discharge: 2017-06-09 | Disposition: A | Discharge status: Home / Self Care | Payer: BLUE CROSS/BLUE SHIELD | Source: Ambulatory Visit | Attending: Hematology & Oncology | Admitting: Hematology & Oncology

## 2017-06-09 ENCOUNTER — Encounter (HOSPITAL_COMMUNITY): Payer: Self-pay

## 2017-06-09 ENCOUNTER — Telehealth: Payer: Self-pay | Admitting: Oncology

## 2017-06-09 ENCOUNTER — Other Ambulatory Visit: Payer: Self-pay | Admitting: Hematology & Oncology

## 2017-06-09 DIAGNOSIS — C3491 Malignant neoplasm of unspecified part of right bronchus or lung: Secondary | ICD-10-CM

## 2017-06-09 DIAGNOSIS — F419 Anxiety disorder, unspecified: Secondary | ICD-10-CM | POA: Diagnosis not present

## 2017-06-09 DIAGNOSIS — F329 Major depressive disorder, single episode, unspecified: Secondary | ICD-10-CM | POA: Diagnosis not present

## 2017-06-09 DIAGNOSIS — F17210 Nicotine dependence, cigarettes, uncomplicated: Secondary | ICD-10-CM | POA: Insufficient documentation

## 2017-06-09 HISTORY — PX: IR FLUORO GUIDE PORT INSERTION RIGHT: IMG5741

## 2017-06-09 HISTORY — PX: IR US GUIDE VASC ACCESS RIGHT: IMG2390

## 2017-06-09 LAB — CBC WITH DIFFERENTIAL/PLATELET
BASOS ABS: 0 10*3/uL (ref 0.0–0.1)
BASOS PCT: 0 %
EOS ABS: 0 10*3/uL (ref 0.0–0.7)
Eosinophils Relative: 0 %
HCT: 30.5 % — ABNORMAL LOW (ref 36.0–46.0)
Hemoglobin: 10.1 g/dL — ABNORMAL LOW (ref 12.0–15.0)
Lymphocytes Relative: 3 %
Lymphs Abs: 0.5 10*3/uL — ABNORMAL LOW (ref 0.7–4.0)
MCH: 28.5 pg (ref 26.0–34.0)
MCHC: 33.1 g/dL (ref 30.0–36.0)
MCV: 85.9 fL (ref 78.0–100.0)
MONO ABS: 0.7 10*3/uL (ref 0.1–1.0)
Monocytes Relative: 4 %
Neutro Abs: 16.6 10*3/uL — ABNORMAL HIGH (ref 1.7–7.7)
Neutrophils Relative %: 93 %
PLATELETS: 95 10*3/uL — AB (ref 150–400)
RBC: 3.55 MIL/uL — ABNORMAL LOW (ref 3.87–5.11)
RDW: 15.7 % — AB (ref 11.5–15.5)
WBC: 17.8 10*3/uL — AB (ref 4.0–10.5)

## 2017-06-09 LAB — PROTIME-INR
INR: 1.02
PROTHROMBIN TIME: 13.3 s (ref 11.4–15.2)

## 2017-06-09 MED ORDER — CEFAZOLIN SODIUM-DEXTROSE 2-4 GM/100ML-% IV SOLN
2.0000 g | INTRAVENOUS | Status: AC
  Administered 2017-06-09: 2 g via INTRAVENOUS

## 2017-06-09 MED ORDER — MIDAZOLAM HCL 2 MG/2ML IJ SOLN
INTRAMUSCULAR | Status: AC
  Filled 2017-06-09: qty 4

## 2017-06-09 MED ORDER — FENTANYL CITRATE (PF) 100 MCG/2ML IJ SOLN
INTRAMUSCULAR | Status: AC
  Filled 2017-06-09: qty 2

## 2017-06-09 MED ORDER — HEPARIN SOD (PORK) LOCK FLUSH 100 UNIT/ML IV SOLN
INTRAVENOUS | Status: AC
  Filled 2017-06-09: qty 5

## 2017-06-09 MED ORDER — SODIUM CHLORIDE 0.9 % IV SOLN
INTRAVENOUS | Status: DC
  Administered 2017-06-09: 13:00:00 via INTRAVENOUS

## 2017-06-09 MED ORDER — MIDAZOLAM HCL 2 MG/2ML IJ SOLN
INTRAMUSCULAR | Status: AC | PRN
  Administered 2017-06-09 (×4): 1 mg via INTRAVENOUS

## 2017-06-09 MED ORDER — LIDOCAINE HCL 1 % IJ SOLN
INTRAMUSCULAR | Status: AC
  Filled 2017-06-09: qty 20

## 2017-06-09 MED ORDER — CEFAZOLIN SODIUM-DEXTROSE 2-4 GM/100ML-% IV SOLN
INTRAVENOUS | Status: AC
  Administered 2017-06-09: 2 g via INTRAVENOUS
  Filled 2017-06-09: qty 100

## 2017-06-09 MED ORDER — HEPARIN SOD (PORK) LOCK FLUSH 100 UNIT/ML IV SOLN
INTRAVENOUS | Status: AC | PRN
  Administered 2017-06-09: 500 [IU] via INTRAVENOUS

## 2017-06-09 MED ORDER — LIDOCAINE HCL (PF) 1 % IJ SOLN
INTRAMUSCULAR | Status: AC | PRN
  Administered 2017-06-09: 10 mL
  Administered 2017-06-09: 20 mL

## 2017-06-09 MED ORDER — FENTANYL CITRATE (PF) 100 MCG/2ML IJ SOLN
INTRAMUSCULAR | Status: AC | PRN
  Administered 2017-06-09 (×2): 50 ug via INTRAVENOUS

## 2017-06-09 NOTE — Telephone Encounter (Signed)
Left message for patient regarding her radiation treatment today.  Requested a return call.

## 2017-06-09 NOTE — Procedures (Signed)
RIJV PAC SVC RA EBL 0 Comp 0 

## 2017-06-09 NOTE — Discharge Instructions (Signed)
Implanted Port Home Guide °An implanted port is a type of central line that is placed under the skin. Central lines are used to provide IV access when treatment or nutrition needs to be given through a person’s veins. Implanted ports are used for long-term IV access. An implanted port may be placed because: °· You need IV medicine that would be irritating to the small veins in your hands or arms. °· You need long-term IV medicines, such as antibiotics. °· You need IV nutrition for a long period. °· You need frequent blood draws for lab tests. °· You need dialysis. ° °Implanted ports are usually placed in the chest area, but they can also be placed in the upper arm, the abdomen, or the leg. An implanted port has two main parts: °· Reservoir. The reservoir is round and will appear as a small, raised area under your skin. The reservoir is the part where a needle is inserted to give medicines or draw blood. °· Catheter. The catheter is a thin, flexible tube that extends from the reservoir. The catheter is placed into a large vein. Medicine that is inserted into the reservoir goes into the catheter and then into the vein. ° °How will I care for my incision site? °Do not get the incision site wet. Bathe or shower as directed by your health care provider. °How is my port accessed? °Special steps must be taken to access the port: °· Before the port is accessed, a numbing cream can be placed on the skin. This helps numb the skin over the port site. °· Your health care provider uses a sterile technique to access the port. °? Your health care provider must put on a mask and sterile gloves. °? The skin over your port is cleaned carefully with an antiseptic and allowed to dry. °? The port is gently pinched between sterile gloves, and a needle is inserted into the port. °· Only "non-coring" port needles should be used to access the port. Once the port is accessed, a blood return should be checked. This helps ensure that the port  is in the vein and is not clogged. °· If your port needs to remain accessed for a constant infusion, a clear (transparent) bandage will be placed over the needle site. The bandage and needle will need to be changed every week, or as directed by your health care provider. °· Keep the bandage covering the needle clean and dry. Do not get it wet. Follow your health care provider’s instructions on how to take a shower or bath while the port is accessed. °· If your port does not need to stay accessed, no bandage is needed over the port. ° °What is flushing? °Flushing helps keep the port from getting clogged. Follow your health care provider’s instructions on how and when to flush the port. Ports are usually flushed with saline solution or a medicine called heparin. The need for flushing will depend on how the port is used. °· If the port is used for intermittent medicines or blood draws, the port will need to be flushed: °? After medicines have been given. °? After blood has been drawn. °? As part of routine maintenance. °· If a constant infusion is running, the port may not need to be flushed. ° °How long will my port stay implanted? °The port can stay in for as long as your health care provider thinks it is needed. When it is time for the port to come out, surgery will be   done to remove it. The procedure is similar to the one performed when the port was put in. When should I seek immediate medical care? When you have an implanted port, you should seek immediate medical care if:  You notice a bad smell coming from the incision site.  You have swelling, redness, or drainage at the incision site.  You have more swelling or pain at the port site or the surrounding area.  You have a fever that is not controlled with medicine.  This information is not intended to replace advice given to you by your health care provider. Make sure you discuss any questions you have with your health care provider. Document  Released: 04/06/2005 Document Revised: 09/12/2015 Document Reviewed: 12/12/2012 Elsevier Interactive Patient Education  2017 [employer]. Moderate Conscious Sedation, Adult, Care After These instructions provide you with information about caring for yourself after your procedure. Your health care provider may also give you more specific instructions. Your treatment has been planned according to current medical practices, but problems sometimes occur. Call your health care provider if you have any problems or questions after your procedure. What can I expect after the procedure? After your procedure, it is common:  To feel sleepy for several hours.  To feel clumsy and have poor balance for several hours.  To have poor judgment for several hours.  To vomit if you eat too soon.  Follow these instructions at home: For at least 24 hours after the procedure:   Do not: ? Participate in activities where you could fall or become injured. ? Drive. ? Use heavy machinery. ? Drink alcohol. ? Take sleeping pills or medicines that cause drowsiness. ? Make important decisions or sign legal documents. ? Take care of children on your own.  Rest. Eating and drinking  Follow the diet recommended by your health care provider.  If you vomit: ? Drink water, juice, or soup when you can drink without vomiting. ? Make sure you have little or no nausea before eating solid foods. General instructions  Have a responsible adult stay with you until you are awake and alert.  Take over-the-counter and prescription medicines only as told by your health care provider.  If you smoke, do not smoke without supervision.  Keep all follow-up visits as told by your health care provider. This is important. Contact a health care provider if:  You keep feeling nauseous or you keep vomiting.  You feel light-headed.  You develop a rash.  You have a fever. Get help right away if:  You have trouble  breathing. This information is not intended to replace advice given to you by your health care provider. Make sure you discuss any questions you have with your health care provider. Document Released: 01/25/2013 Document Revised: 09/09/2015 Document Reviewed: 07/27/2015 Elsevier Interactive Patient Education  [employer].

## 2017-06-09 NOTE — H&P (Signed)
Referring Physician(s): Ennever,Peter R  Supervising Physician: Marybelle Killings  Patient Status:  [facility]  Chief Complaint:  "I'm getting a port a cath"  Subjective: Patient familiar to IR service from prior PICC line placement and paracentesis in 2013, along with a right abdominal abscess drain in 2013 and most recently right iliac bone lesion biopsy on 04/26/17. She is a smoker with stage IV metastatic right lung cancer and presents today for Port-A-Cath placement for palliative chemo/immunotherapy.  She currently denies fever, nausea, vomiting or abnormal bleeding.  She does have fatigue, occasional headaches, occasional cough with chest discomfort, dyspnea with exertion and intermittent abdominal and back pain. Past Medical History:  Diagnosis Date  . Alcohol abuse    H/o withdrawal   . Alcohol withdrawal ([place]) 10/02/2013  . Anxiety   . Cancer [facility])    unknown primary. known bone mets.  . Chronic diarrhea    hx of  . Constipation   . Depression   . Elevated liver function tests few yrs ago  . Metastatic lung cancer (metastasis from lung to other site), right ([place]) 06/01/2017  . Pancreatitis   . PONV (postoperative nausea and vomiting)   . Rosacea   . Wears glasses    Past Surgical History:  Procedure Laterality Date  . CHOLECYSTECTOMY    . COLONOSCOPY  Never  . DIAGNOSTIC MAMMOGRAM  2008  . ERCP  10/06/2011   Procedure: ENDOSCOPIC RETROGRADE CHOLANGIOPANCREATOGRAPHY (ERCP);  Surgeon: Beryle Beams, MD;  Location: [facility] END[facility];  Service: Endoscopy;  Laterality: N/A;  . ESOPHAGOGASTRODUODENOSCOPY  03/21/2011   Procedure: ESOPHAGOGASTRODUODENOSCOPY (EGD);  Surgeon: Scarlette Shorts, MD;  Location: [facility] ENDOSCOPY;  Service: Endoscopy;  Laterality: N/A;  Patient may need to be done at bedside tomorrow depending on status  . ESOPHAGOGASTRODUODENOSCOPY  01/06/2012   Procedure: ESOPHAGOGASTRODUODENOSCOPY (EGD);  Surgeon: Wonda Horner, MD;  Location: [facility] ENDOSCOPY;  Service: Endoscopy;   Laterality: N/A;  bedside  . HERNIA REPAIR    . INSERTION OF MESH  07/09/2016   Procedure: INSERTION OF MESH;  Surgeon: Michael Boston, MD;  Location: [facility];  Service: General;;  . LAPAROTOMY  01/03/2012   Procedure: EXPLORATORY LAPAROTOMY;  Surgeon: Pedro Earls, MD;  Location: [facility];  Service: General;  Laterality: N/A;  debridement of necrotic pancreas and placement of drains x2  . pancreatic cyst removed   2015  . SHOULDER SURGERY Left 02/18/2010   clavical repair  . TUBAL LIGATION    . VENTRAL HERNIA REPAIR N/A 07/09/2016   Procedure: LAPAROSCOPIC LYSIS OF ADHESIONS  VENTRAL WALL HERNIA REPAIR WITH MESH EXCISION OF HERNIA [place];  Surgeon: Michael Boston, MD;  Location: [facility];  Service: General;  Laterality: N/A;      Allergies: Ciprofloxacin  Medications: Prior to Admission medications   Medication Sig Start Date End Date Taking? Authorizing Provider  amoxicillin-clavulanate (AUGMENTIN) 875-125 MG tablet Take 1 tablet by mouth 2 (two) times daily. 06/02/17  Yes Hayden Pedro, PA-C  Calcium Carbonate-Vitamin D (CALCIUM 600+D PO) Take 1 tablet by mouth daily.   Yes [provider]  dexamethasone (DECADRON) 6 MG tablet Take 5 pills today with food, then 3 pills a day with food. 05/10/17  Yes Volanda Napoleon, MD  diphenoxylate-atropine (LOMOTIL) 2.5-0.025 MG tablet Take 2 tablets by mouth 4 (four) times daily as needed for diarrhea or loose stools. 06/07/17  Yes Gery Pray, MD  EQ NICOTINE 14 MG/24HR patch  03/31/17  Yes [provider]  fentaNYL ([place] - DOSED  MCG/HR) 75 MCG/HR APPLY 1 PATCH TOPICALLY EVERY 72 HOURS 06/07/17  Yes Gery Pray, MD  furosemide (LASIX) 20 MG tablet Take 1 tablet (20 mg total) by mouth daily. 06/07/17  Yes Gery Pray, MD  loperamide (IMODIUM A-D) 2 MG tablet Take 2 mg by mouth as needed for diarrhea or loose stools.   Yes [provider]  LORazepam (ATIVAN) 1 MG tablet Take 1 tablet (1 mg total) by mouth 3 (three)  times daily as needed. for anxiety 06/03/17  Yes Ennever, Rudell Cobb, MD  Multiple Vitamin (MULTIVITAMIN WITH MINERALS) TABS tablet Take 1 tablet by mouth daily.   Yes [provider]  oxyCODONE 20 MG TABS Take 1 tablet (20 mg total) by mouth every 6 (six) hours as needed for severe pain. 06/08/17  Yes Volanda Napoleon, MD  prochlorperazine (COMPAZINE) 10 MG tablet Take 1 tablet (10 mg total) by mouth every 6 (six) hours as needed for nausea or vomiting. 05/13/17  Yes Gery Pray, MD  valACYclovir (VALTREX) 1000 MG tablet Take 1 tablet (1,000 mg total) by mouth daily as needed (fever blisters). 06/04/17  Yes Bruning, Ashlyn, PA-C  docusate sodium (COLACE) 250 MG capsule Take 1 capsule (250 mg total) by mouth daily. While taking pain medication 03/30/17   Duffy Bruce, MD  oseltamivir (TAMIFLU) 75 MG capsule Take 1 capsule (75 mg total) by mouth 2 (two) times daily. 06/01/17   Volanda Napoleon, MD     Vital Signs: Blood pressure 138/65, heart rate 98, respirations 18, temp 98.6, O2 sat 100% room air LMP 12/08/2006   Physical Exam awake, alert.  Chest with distant breath sounds bilaterally.  Heart with regular rate and rhythm.  Abdomen soft, positive bowel sounds, currently nontender.  Bilateral lower extremity edema noted  Imaging: Mr Jeri Cos Wo Contrast  Result Date: 06/06/2017 CLINICAL DATA:  Non-small cell lung cancer staging EXAM: MRI HEAD WITHOUT AND WITH CONTRAST TECHNIQUE: Multiplanar, multiecho pulse sequences of the brain and surrounding structures were obtained without and with intravenous contrast. CONTRAST:  58mL MULTIHANCE GADOBENATE DIMEGLUMINE 529 MG/ML IV SOLN COMPARISON:  Head CT 03/02/2010 FINDINGS: Brain: The midline structures are normal. There is no acute infarct or acute hemorrhage. No mass lesion, hydrocephalus, dural abnormality or extra-axial collection. The brain parenchymal signal is normal. No age-advanced or lobar predominant atrophy. No chronic microhemorrhage or  superficial siderosis. Vascular: Major intracranial arterial and venous sinus flow voids are preserved. Skull and upper cervical spine: There are multiple T1 hyperintense foci within the calvarium, the largest in the right parietal bone. This also shows intrinsic T2 hyperintensity, likely a hemangioma. Sinuses/Orbits: Mild left sphenoid sinus mucosal thickening. No mastoid or middle ear effusion. Normal orbits. IMPRESSION: 1. No intracranial metastatic disease.  Normal brain. 2. 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 Electronically Signed   By: Ulyses Jarred M.D.   On: 06/06/2017 01:26    Labs:  CBC: Recent Labs    12/25/16 0729 03/30/17 1015 04/26/17 1005 05/06/17 0839 05/10/17 1537 06/01/17 1304 06/07/17 1339  WBC 11.8* 14.1* 12.5* 17.6* 16.5* 4.2 13.4*  HGB 13.4 12.1 12.1 11.1*  --   --   --   HCT 40.7 36.5 37.6 33.4* 33.1* 31.2* 30.4*  PLT 317 411* 450* 401* 445* 143* 91*    COAGS: Recent Labs    04/26/17 1005  INR 0.96  APTT 29    BMP: Recent Labs    03/30/17 1015 05/06/17 0839 05/10/17 1537 06/01/17 1304 06/07/17  1339  NA 135 134* 136 125* 129*  K 3.9 4.4 4.2 3.8 3.8  CL 103 102 103 91* 96*  CO2 24 24 26 24  21*  GLUCOSE 101* 96 116 108 164*  BUN 10 10 8 14 9   CALCIUM 9.0 9.1 9.6 8.5 8.7  CREATININE 0.72 0.55 0.70 0.66 0.66  GFRNONAA >60 >60  --  >60 >60  GFRAA >60 >60  --  >60 >60    LIVER FUNCTION TESTS: Recent Labs    03/30/17 1015 05/06/17 0839 05/10/17 1537 06/01/17 1304  BILITOT 0.5 0.4 0.4 0.5  AST 17 15 20 17   ALT 12* 10* 14 9  ALKPHOS 117 173* 159* 154*  PROT 7.0 7.7 8.0 6.4  ALBUMIN 3.3* 3.1* 3.0* 2.3*    Assessment and Plan: 62 year old  female smoker with stage IV metastatic right lung cancer ; presents today for Port-A-Cath placement for palliative chemo/immunotherapy. Risks and benefits discussed with the patient including, but not limited to bleeding, infection,  pneumothorax, or fibrin sheath development and need for additional procedures.  All of the patient's questions were answered, patient is agreeable to proceed. Consent signed and in chart.  Labs pending   Electronically Signed: D. Rowe Robert, PA-C 06/09/2017, 1:00 PM   I spent a total of 20 minutes at the the patient's bedside AND on the patient's hospital floor or unit, greater than 50% of which was counseling/coordinating care for port a cath placement

## 2017-06-10 ENCOUNTER — Telehealth: Payer: Self-pay | Admitting: *Deleted

## 2017-06-10 ENCOUNTER — Telehealth: Payer: Self-pay | Admitting: Oncology

## 2017-06-10 ENCOUNTER — Ambulatory Visit
Admission: RE | Admit: 2017-06-10 | Discharge: 2017-06-10 | Disposition: A | Discharge status: Home / Self Care | Payer: BLUE CROSS/BLUE SHIELD | Source: Ambulatory Visit | Attending: Radiation Oncology | Admitting: Radiation Oncology

## 2017-06-10 DIAGNOSIS — C7951 Secondary malignant neoplasm of bone: Secondary | ICD-10-CM | POA: Diagnosis not present

## 2017-06-10 NOTE — Telephone Encounter (Signed)
Called patient and advised her that the prescription for Fentanyl was sent to [facility] on 06/07/17.  Patient said she will call them.  She also said she is not feel well and is hurting all over.  She feels like she has a fever and is going to check her temperature and let us know.

## 2017-06-10 NOTE — Telephone Encounter (Signed)
Called patient back and verified that she was able to pick up her Fentanyl prescription.  She said she called the pharmacy and they said it was ready.  Also let her know that Dr. Marin Olp has been notified of her lab results and will see her next week.  She mentioned that she has talked with Dr. Ernestene Mention at [facility] and was told that he will not be able to take out her pancreatic stent due to her blood counts.  She said he may retry in a month if her labs are better.

## 2017-06-10 NOTE — Telephone Encounter (Signed)
CALLED PATIENT TO INFORM OF FU APPT. WITH DR. KINARD ON 07-08-17 @ 11:45 AM, LVM FOR A RETURN CALL

## 2017-06-13 ENCOUNTER — Emergency Department (HOSPITAL_COMMUNITY): Payer: BLUE CROSS/BLUE SHIELD

## 2017-06-13 ENCOUNTER — Other Ambulatory Visit: Payer: Self-pay

## 2017-06-13 ENCOUNTER — Encounter (HOSPITAL_COMMUNITY): Payer: Self-pay | Admitting: Emergency Medicine

## 2017-06-13 ENCOUNTER — Inpatient Hospital Stay (HOSPITAL_COMMUNITY)
Admission: EM | Admit: 2017-06-13 | Discharge: 2017-06-17 | DRG: 176 | Disposition: A | Discharge status: Home / Self Care | Payer: BLUE CROSS/BLUE SHIELD | Attending: Family Medicine | Admitting: Family Medicine

## 2017-06-13 DIAGNOSIS — R0600 Dyspnea, unspecified: Secondary | ICD-10-CM | POA: Diagnosis not present

## 2017-06-13 DIAGNOSIS — D696 Thrombocytopenia, unspecified: Secondary | ICD-10-CM | POA: Diagnosis not present

## 2017-06-13 DIAGNOSIS — Z8711 Personal history of peptic ulcer disease: Secondary | ICD-10-CM

## 2017-06-13 DIAGNOSIS — F17210 Nicotine dependence, cigarettes, uncomplicated: Secondary | ICD-10-CM | POA: Diagnosis present

## 2017-06-13 DIAGNOSIS — C7951 Secondary malignant neoplasm of bone: Secondary | ICD-10-CM | POA: Diagnosis present

## 2017-06-13 DIAGNOSIS — I2699 Other pulmonary embolism without acute cor pulmonale: Secondary | ICD-10-CM | POA: Diagnosis present

## 2017-06-13 DIAGNOSIS — C801 Malignant (primary) neoplasm, unspecified: Secondary | ICD-10-CM | POA: Diagnosis present

## 2017-06-13 DIAGNOSIS — J449 Chronic obstructive pulmonary disease, unspecified: Secondary | ICD-10-CM | POA: Diagnosis present

## 2017-06-13 DIAGNOSIS — R531 Weakness: Secondary | ICD-10-CM | POA: Diagnosis present

## 2017-06-13 DIAGNOSIS — R062 Wheezing: Secondary | ICD-10-CM

## 2017-06-13 DIAGNOSIS — D638 Anemia in other chronic diseases classified elsewhere: Secondary | ICD-10-CM | POA: Diagnosis present

## 2017-06-13 DIAGNOSIS — C3490 Malignant neoplasm of unspecified part of unspecified bronchus or lung: Secondary | ICD-10-CM

## 2017-06-13 DIAGNOSIS — Z8249 Family history of ischemic heart disease and other diseases of the circulatory system: Secondary | ICD-10-CM

## 2017-06-13 DIAGNOSIS — Z823 Family history of stroke: Secondary | ICD-10-CM

## 2017-06-13 DIAGNOSIS — K861 Other chronic pancreatitis: Secondary | ICD-10-CM | POA: Diagnosis present

## 2017-06-13 DIAGNOSIS — M7989 Other specified soft tissue disorders: Secondary | ICD-10-CM | POA: Diagnosis not present

## 2017-06-13 DIAGNOSIS — B009 Herpesviral infection, unspecified: Secondary | ICD-10-CM | POA: Diagnosis present

## 2017-06-13 DIAGNOSIS — F411 Generalized anxiety disorder: Secondary | ICD-10-CM

## 2017-06-13 DIAGNOSIS — C3411 Malignant neoplasm of upper lobe, right bronchus or lung: Secondary | ICD-10-CM | POA: Diagnosis not present

## 2017-06-13 DIAGNOSIS — C3491 Malignant neoplasm of unspecified part of right bronchus or lung: Secondary | ICD-10-CM | POA: Diagnosis present

## 2017-06-13 DIAGNOSIS — F4322 Adjustment disorder with anxiety: Secondary | ICD-10-CM | POA: Diagnosis present

## 2017-06-13 DIAGNOSIS — Z923 Personal history of irradiation: Secondary | ICD-10-CM

## 2017-06-13 DIAGNOSIS — Z79891 Long term (current) use of opiate analgesic: Secondary | ICD-10-CM

## 2017-06-13 DIAGNOSIS — G893 Neoplasm related pain (acute) (chronic): Secondary | ICD-10-CM | POA: Diagnosis not present

## 2017-06-13 DIAGNOSIS — Z515 Encounter for palliative care: Secondary | ICD-10-CM

## 2017-06-13 DIAGNOSIS — Z7901 Long term (current) use of anticoagulants: Secondary | ICD-10-CM | POA: Diagnosis not present

## 2017-06-13 DIAGNOSIS — Z720 Tobacco use: Secondary | ICD-10-CM | POA: Diagnosis not present

## 2017-06-13 DIAGNOSIS — E611 Iron deficiency: Secondary | ICD-10-CM | POA: Diagnosis not present

## 2017-06-13 DIAGNOSIS — F339 Major depressive disorder, recurrent, unspecified: Secondary | ICD-10-CM | POA: Diagnosis present

## 2017-06-13 DIAGNOSIS — R600 Localized edema: Secondary | ICD-10-CM | POA: Diagnosis present

## 2017-06-13 DIAGNOSIS — K8689 Other specified diseases of pancreas: Secondary | ICD-10-CM | POA: Diagnosis present

## 2017-06-13 DIAGNOSIS — R609 Edema, unspecified: Secondary | ICD-10-CM | POA: Diagnosis not present

## 2017-06-13 DIAGNOSIS — Z7189 Other specified counseling: Secondary | ICD-10-CM | POA: Diagnosis not present

## 2017-06-13 HISTORY — DX: Unspecified abdominal pain: R10.9

## 2017-06-13 HISTORY — DX: Pseudocyst of pancreas: K86.3

## 2017-06-13 HISTORY — DX: Dyspnea, unspecified: R06.00

## 2017-06-13 HISTORY — DX: Other specified diseases of pancreas: K86.89

## 2017-06-13 HISTORY — DX: Diaphragmatic hernia without obstruction or gangrene: K44.9

## 2017-06-13 HISTORY — DX: Right upper quadrant pain: R10.11

## 2017-06-13 HISTORY — DX: Gastric ulcer, unspecified as acute or chronic, without hemorrhage or perforation: K25.9

## 2017-06-13 HISTORY — DX: Fatty (change of) liver, not elsewhere classified: K76.0

## 2017-06-13 HISTORY — DX: Other specified diseases of liver: K76.89

## 2017-06-13 HISTORY — DX: Incisional hernia without obstruction or gangrene: K43.2

## 2017-06-13 HISTORY — DX: Major depressive disorder, recurrent severe without psychotic features: F33.2

## 2017-06-13 HISTORY — DX: Sciatica, right side: M54.31

## 2017-06-13 LAB — BASIC METABOLIC PANEL
Anion gap: 13 (ref 5–15)
BUN: 12 mg/dL (ref 6–20)
CHLORIDE: 95 mmol/L — AB (ref 101–111)
CO2: 24 mmol/L (ref 22–32)
CREATININE: 0.6 mg/dL (ref 0.44–1.00)
Calcium: 8.9 mg/dL (ref 8.9–10.3)
GFR calc Af Amer: >60 mL/min (ref >60)
GFR calc non Af Amer: >60 mL/min (ref >60)
GLUCOSE: 129 mg/dL — AB (ref 65–99)
Potassium: 3.9 mmol/L (ref 3.5–5.1)
Sodium: 132 mmol/L — ABNORMAL LOW (ref 135–145)

## 2017-06-13 LAB — CBC
HCT: 32.8 % — ABNORMAL LOW (ref 36.0–46.0)
Hemoglobin: 10.6 g/dL — ABNORMAL LOW (ref 12.0–15.0)
MCH: 28.4 pg (ref 26.0–34.0)
MCHC: 32.3 g/dL (ref 30.0–36.0)
MCV: 87.9 fL (ref 78.0–100.0)
Platelets: 98 10*3/uL — ABNORMAL LOW (ref 150–400)
RBC: 3.73 MIL/uL — ABNORMAL LOW (ref 3.87–5.11)
RDW: 16.2 % — AB (ref 11.5–15.5)
WBC: 15.3 10*3/uL — ABNORMAL HIGH (ref 4.0–10.5)

## 2017-06-13 LAB — PROCALCITONIN: Procalcitonin: 0.18 ng/mL

## 2017-06-13 LAB — I-STAT TROPONIN, ED: Troponin i, poc: 0 ng/mL (ref 0.00–0.08)

## 2017-06-13 LAB — BRAIN NATRIURETIC PEPTIDE: B NATRIURETIC PEPTIDE 5: 89.5 pg/mL (ref 0.0–100.0)

## 2017-06-13 MED ORDER — ALBUTEROL SULFATE HFA 108 (90 BASE) MCG/ACT IN AERS
2.0000 | INHALATION_SPRAY | RESPIRATORY_TRACT | Status: DC | PRN
  Administered 2017-06-13: 2 via RESPIRATORY_TRACT
  Filled 2017-06-13: qty 6.7

## 2017-06-13 MED ORDER — OXYCODONE HCL 5 MG PO TABS
20.0000 mg | ORAL_TABLET | Freq: Four times a day (QID) | ORAL | Status: DC | PRN
  Administered 2017-06-13 – 2017-06-14 (×2): 20 mg via ORAL
  Filled 2017-06-13 (×2): qty 4

## 2017-06-13 MED ORDER — ENOXAPARIN SODIUM 80 MG/0.8ML ~~LOC~~ SOLN
70.0000 mg | Freq: Two times a day (BID) | SUBCUTANEOUS | Status: DC
  Administered 2017-06-14 – 2017-06-15 (×3): 70 mg via SUBCUTANEOUS
  Filled 2017-06-13 (×3): qty 0.8

## 2017-06-13 MED ORDER — ENOXAPARIN SODIUM 80 MG/0.8ML ~~LOC~~ SOLN: 70.0000 mg | Freq: Once | SUBCUTANEOUS | Status: DC

## 2017-06-13 MED ORDER — IPRATROPIUM-ALBUTEROL 0.5-2.5 (3) MG/3ML IN SOLN
3.0000 mL | Freq: Four times a day (QID) | RESPIRATORY_TRACT | Status: DC
  Administered 2017-06-13 – 2017-06-14 (×3): 3 mL via RESPIRATORY_TRACT
  Filled 2017-06-13 (×3): qty 3

## 2017-06-13 MED ORDER — ONDANSETRON HCL 4 MG/2ML IJ SOLN
4.0000 mg | Freq: Once | INTRAMUSCULAR | Status: AC
  Administered 2017-06-13: 4 mg via INTRAVENOUS
  Filled 2017-06-13: qty 2

## 2017-06-13 MED ORDER — HYDROMORPHONE HCL 1 MG/ML IJ SOLN
1.0000 mg | Freq: Once | INTRAMUSCULAR | Status: AC
  Administered 2017-06-13: 1 mg via INTRAVENOUS
  Filled 2017-06-13: qty 1

## 2017-06-13 MED ORDER — POLYETHYLENE GLYCOL 3350 17 G PO PACK: 17.0000 g | PACK | Freq: Every day | ORAL | Status: DC | PRN

## 2017-06-13 MED ORDER — IPRATROPIUM BROMIDE 0.02 % IN SOLN
0.5000 mg | Freq: Once | RESPIRATORY_TRACT | Status: AC
  Administered 2017-06-13: 0.5 mg via RESPIRATORY_TRACT
  Filled 2017-06-13: qty 2.5

## 2017-06-13 MED ORDER — ALBUTEROL SULFATE (2.5 MG/3ML) 0.083% IN NEBU: 2.5000 mg | INHALATION_SOLUTION | RESPIRATORY_TRACT | Status: DC | PRN

## 2017-06-13 MED ORDER — PREDNISONE 50 MG PO TABS: 50.0000 mg | ORAL_TABLET | Freq: Every day | ORAL | Status: DC

## 2017-06-13 MED ORDER — LEVOFLOXACIN 750 MG PO TABS
750.0000 mg | ORAL_TABLET | Freq: Every day | ORAL | Status: DC
  Administered 2017-06-13: 750 mg via ORAL
  Filled 2017-06-13: qty 1

## 2017-06-13 MED ORDER — ALBUTEROL SULFATE (2.5 MG/3ML) 0.083% IN NEBU
5.0000 mg | INHALATION_SOLUTION | Freq: Once | RESPIRATORY_TRACT | Status: AC
  Administered 2017-06-13: 5 mg via RESPIRATORY_TRACT
  Filled 2017-06-13: qty 6

## 2017-06-13 MED ORDER — METHYLPREDNISOLONE SODIUM SUCC 125 MG IJ SOLR
125.0000 mg | Freq: Once | INTRAMUSCULAR | Status: AC
  Administered 2017-06-13: 125 mg via INTRAVENOUS
  Filled 2017-06-13: qty 2

## 2017-06-13 MED ORDER — DOCUSATE SODIUM 50 MG PO CAPS
250.0000 mg | ORAL_CAPSULE | Freq: Two times a day (BID) | ORAL | Status: DC | PRN
  Filled 2017-06-13: qty 1

## 2017-06-13 MED ORDER — NICOTINE 14 MG/24HR TD PT24: 14.0000 mg | MEDICATED_PATCH | Freq: Every day | TRANSDERMAL | Status: DC | PRN

## 2017-06-13 MED ORDER — ENOXAPARIN SODIUM 80 MG/0.8ML ~~LOC~~ SOLN
70.0000 mg | Freq: Once | SUBCUTANEOUS | Status: AC
  Administered 2017-06-13: 70 mg via SUBCUTANEOUS
  Filled 2017-06-13: qty 0.8

## 2017-06-13 MED ORDER — IOPAMIDOL (ISOVUE-370) INJECTION 76%
INTRAVENOUS | Status: AC
  Administered 2017-06-13: 60 mL
  Filled 2017-06-13: qty 100

## 2017-06-13 MED ORDER — ONDANSETRON HCL 4 MG PO TABS
4.0000 mg | ORAL_TABLET | Freq: Three times a day (TID) | ORAL | Status: DC | PRN
  Filled 2017-06-13: qty 1

## 2017-06-13 MED ORDER — LORAZEPAM 1 MG PO TABS
1.0000 mg | ORAL_TABLET | Freq: Two times a day (BID) | ORAL | Status: DC | PRN
Start: 2017-06-13 — End: 2017-06-16
  Administered 2017-06-13 – 2017-06-15 (×4): 1 mg via ORAL
  Filled 2017-06-13 (×5): qty 1

## 2017-06-13 MED ORDER — CALCIUM CARBONATE-VITAMIN D 500-200 MG-UNIT PO TABS
ORAL_TABLET | Freq: Every day | ORAL | Status: DC
  Administered 2017-06-14 – 2017-06-16 (×3): 1 via ORAL
  Administered 2017-06-17: 11:00:00 via ORAL
  Filled 2017-06-13 (×4): qty 1

## 2017-06-13 MED ORDER — HYDROMORPHONE HCL 1 MG/ML IJ SOLN
1.0000 mg | Freq: Once | INTRAMUSCULAR | Status: AC
Start: 2017-06-13 — End: 2017-06-13
  Administered 2017-06-13: 1 mg via INTRAVENOUS
  Filled 2017-06-13: qty 1

## 2017-06-13 MED ORDER — ENOXAPARIN SODIUM 80 MG/0.8ML ~~LOC~~ SOLN
1.0000 mg/kg | Freq: Once | SUBCUTANEOUS | Status: AC
  Administered 2017-06-13: 15:00:00 70 mg via SUBCUTANEOUS
  Filled 2017-06-13: qty 0.8

## 2017-06-13 NOTE — Progress Notes (Signed)
Continuous pulse ox and K pad requested for patient; there are none available at this time.

## 2017-06-13 NOTE — ED Triage Notes (Signed)
Pt. Stated, I started and having difficulty breathing. I have Stage 4 lung cancer. I just finished radiation treatment last week. I felt better but yesterday I felt like my lungs were filling up with fluid.

## 2017-06-13 NOTE — H&P (Addendum)
[facility] Admission History and Physical Service Pager: [phone]  Patient name: [name] Medical record number: [id number] Date of birth: 01/24/1956 Age: 62 y.o. Gender: female  Primary Care Provider: Esaw Grandchild, NP Consultants: None Code Status: Full (confirmed on admission)  Chief Complaint: Chest pain, difficulty breathing  Assessment and Plan: [name] is a 62 y.o. female presenting with 2 weeks of worsening congestion, cough, shortness of breath. 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 Right lower lobe involving segmental and more peripheral branches as seen on CTA.  Likely from hypercoagulability from malignancy.  Will likely need 3-6 months of Lovenox versus warfarin with bridge. Per literature review of meta-analysis data, appears LMWH has lower risk of recurrance, and complication as compared with warfarin. -Admit to family practice teaching service, attending Dr. Erin Hearing -Telemetry -Lovenox 1mg /kg every 12hr (ordered as lovenox per pharm) -Continuous pulse ox -Supplemental oxygen as necessary, keep sats >90% - AM PT/PTT/INR - plan for 3-20m therapy  Possible COPD exacerbation - Patient without formal dx of COPD, however has been treated as COPD exacerbation with inhalers multiple times in the past.  Groundglass opacities seen on right middle lobe concerning for infection found on CTA. Afebrile. Patient has leukocytosis 15 on admission. Patient has had 2 prior CBCs obtained at outpatient that showed leukocytosis  13 and17 on 2/18, 2/20 respectively.  Patient had WBC of 4 on 2/12. Patient started taking 36 mg of Decadron daily for bone inflammation on 2/13.  All of her  leukocytosis has neutrophil predominance. Leukocytosis may be due to starting steroids, however patient has been taking these x1 month and WBC 12 days ago was 4.  She also has history concerning for URI type syndrome over the past few weeks. It is not entirely clear if SOB is due to just the pulmonary embolism or if there is a superimposed infection on top of it. Presentation is consistent with possible COPD exacerbation.  I do not believe that this is a pneumonia. Will treat with antibiotics as COPD exacerbation and obtain pro-calcitonin to help with guidance of antibiotic therapy. Presentation confounded because she was said to be have diffuse wheezes in the ED but was s/p steroids and multiple duonebs by the time we admitted her. Patient reports she feels much better after receiving Duonebs, and she reports she is breathing much easier. - Status post IV Solu-Medrol (2/24), consider restarting home Decadron tomorrow - start Levaquin 750 mg for 5 days, no QTC prolongation on EKG on admit.  - CBC -Duo nebs every 6h as needed -Pro-calcitonin  Stage IV Right Lung Cancer Adenocarcinoma of unknown primary versus NSCLC with systemic bony metastatic disease. S/p 1 mo of radiation therapy (last ttx 2/21). She has also had a Port-A-Cath placed on right chest 2/20 with anticipation of starting palliative chemotherapy on 2/25. Possible abdominal mets seen on CT abdomen on 2/13. No mets seen on brain MR 2/17.  Receives 50-75 mcg of fentanyl via 72-hour patch and 20 oxycodone every 6 hours as needed.  Last fentanyl patch 50 mcg was placed on 2/23.  -Continue home oxycodone  -We will not aggressively pursue pain management given concern for respiratory distress, can consider giving PRN fentanyl overnight if she is breathing and mentating well -Continue with outpatient management -Bowel regimen Colace and MiraLAX -  Wound care per nursing for Port-A-Cath incision  Lower extremity Edema 2+ pitting edema bilaterally  below ankles. Patient has been taking Lasix 20 mg daily for the past 2 weeks, new medication.  Patient has been hyponatremic on 2/18 with sodium 129.  BMP today shows sodium 132.  There are no focal crackles on lung exam.  No pleural effusion seen on chest x-ray or CTA.  Will hold lasix in the setting of mild hyponatremia.  Consider restarting Lasix if patients dyspnea does not improve/as electrolytes improve. No echocardiogram on file.  -BNP to rule out CHF exacerbation -Daily BMP  -I's and O's  Systemic weakness, worsening Likely due to deconditioning in the setting of metastatic disease. -PT/OT -Nutrition consult  Chronic pancreatitis s/p biliary stenting, stable Likely due to alcohol abuse given patient's history per the chart.  Patient has a biliary stent on 11/30.  Has been endorsing abdominal pain however abdominal exam is unremarkable. Recent imaging on 2/13 CT abdominal pelvis did not show any acute findings consistent with pancreatitis. - continue to monitor abdominal exam  Anxiety  Depression, stable -Patient takes Ativan 1 mg 4 times daily.   -Will order 1 mg twice daily PRN to concern of respiratory depression - can give additional doses if necessary, if breathing comfortably/mentating well -Monitor respiratory status  Tobacco abuse -Nicotine replacement patches as needed -Nursing to provide smoking cessation counseling  FEN/GI: HHD, nutrition consult Prophylaxis: Lovenox for PE Tx  Disposition: Inpatient mgmt of DVT  History of Present Illness:  [name] is a 62 y.o. female presenting with 2 weeks of worsening congestion, non-productive cough, dsypnea.    PMH is significant for Stage IV right lung cancer (adenocarcinoma of unknown primary versus NSCLC) with bony metastasis throughout spine, ribs, sacrum and probable COPD.  Patient has been having cough and what she describes as flulike symptoms including congestion and night sweats for the past 2 weeks. She  endorses chest pain that is worst with coughing and with deep breaths. Chest pain is new from her baseline bony metastasis pain. Additionally, she reports decreased exercise tolerance, now becoming short of breath walking from one room the next at home, and this is new for her. She does not require oxygen at home/baseline. She does feel that symptoms of dyspnea and chest pain came on suddenly about 2 weeks ago. She was seen by her PCP but was found to not have flu and therefore no intervention was started.  She just finished a month-long course of beam radiation for sacral metastasis. Patient also endorses feeling more weak than normal over the past few weeks with worsening pain in her back.   In the ED, patient presented mildly tachycardic to the low 100s-110s and tachypneic, She is been normotensive.  She did desat to 89% and was started on 3 L nasal cannula.  Patient was also given albuterol nebs x3 for wheezing in ED. White count was elevated to 15.3.   CTA was obtained which showed new pulmonary embolism and possible infectious etiology on right lung. EKG showed sinus tachycardia and patient had negative troponin x1. She received 125 mg of IV Solu-Medrol.  Patient was given 70 mg of Lovenox for PE anticoagulation.  She also received 3 mg of Dilaudid in the ED.  Review Of Systems: Per HPI with the following additions:   Review of Systems  Constitutional: Positive for chills, diaphoresis, fever and malaise/fatigue.  HENT: Positive for congestion.   Respiratory: Positive for cough, shortness of breath and wheezing.  Cardiovascular: Positive for chest pain and leg swelling. Negative for palpitations.  Gastrointestinal: Positive for abdominal pain, diarrhea and nausea. Negative for vomiting.  Musculoskeletal: Positive for back pain and neck pain.  Neurological: Positive for weakness.   Patient Active Problem List   Diagnosis Date Noted  . Pulmonary embolism ([place]) 06/13/2017  . Acute dyspnea   .  Abdominal pain 06/02/2017  . Metastatic primary lung cancer, unspecified laterality ([place]) 06/01/2017  . Adenocarcinoma of unknown primary ([place]) 05/13/2017  . Osseous metastasis ([place]) 05/13/2017  . Pancreatic pain 03/31/2017  . Sciatica of right side 02/25/2017  . Tobacco use disorder 02/25/2017  . Incisional hernia s/p lap/open repair with mesh 07/09/2016 07/09/2016  . Chronic pancreatitis ([place]) 04/18/2016  . Pancreatic calcification 04/03/2016  . Abdominal pain, right upper quadrant 04/03/2016  . Counseling regarding goals of care 04/03/2016  . Major depressive disorder, recurrent, severe without psychotic features ([place])   . Substance induced mood disorder ([place]) 11/06/2014  . Bilateral knee pain 10/14/2014  . Alcohol use disorder, severe, dependence ([place]) 08/17/2014  . Suicidal behavior   . Alcohol dependence ([place]) 10/02/2013  . Major depression, recurrent ([place]) 10/02/2013  . Gastric ulcer 01/20/2012  . Fatty liver 10/11/2011  . 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 Elevated liver function tests   . GERD (gastroesophageal reflux disease)   . ESOPHAGEAL MOTILITY DISORDER 10/01/2008  . HIATAL HERNIA 10/01/2008  . FATTY LIVER DISEASE 10/01/2008    Past Medical History: Past Medical History:  Diagnosis Date  . Alcohol abuse    H/o withdrawal   . Alcohol withdrawal ([place]) 10/02/2013  . Anxiety   . Cancer [facility])    unknown primary. known bone mets.  . Chronic diarrhea    hx of  . Constipation   . Depression   . Elevated liver function tests few yrs ago  . Metastatic lung cancer (metastasis from lung to other site), right ([place]) 06/01/2017  . Pancreatitis   . PONV (postoperative nausea and vomiting)   . Rosacea   . Wears glasses     Past Surgical History: Past Surgical History:  Procedure Laterality Date  . CHOLECYSTECTOMY    . COLONOSCOPY  Never  . DIAGNOSTIC MAMMOGRAM  2008  . ERCP  10/06/2011   Procedure: ENDOSCOPIC RETROGRADE CHOLANGIOPANCREATOGRAPHY (ERCP);  Surgeon:  Beryle Beams, MD;  Location: [facility];  Service: Endoscopy;  Laterality: N/A;  . ESOPHAGOGASTRODUODENOSCOPY  03/21/2011   Procedure: ESOPHAGOGASTRODUODENOSCOPY (EGD);  Surgeon: Scarlette Shorts, MD;  Location: [facility];  Service: Endoscopy;  Laterality: N/A;  Patient may need to be done at bedside tomorrow depending on status  . ESOPHAGOGASTRODUODENOSCOPY  01/06/2012   Procedure: ESOPHAGOGASTRODUODENOSCOPY (EGD);  Surgeon: Wonda Horner, MD;  Location: [facility];  Service: Endoscopy;  Laterality: N/A;  bedside  . HERNIA REPAIR    . INSERTION OF MESH  07/09/2016   Procedure: INSERTION OF MESH;  Surgeon: Michael Boston, MD;  Location: [facility];  Service: General;;  . IR FLUORO GUIDE PORT INSERTION RIGHT  06/09/2017  . IR US GUIDE VASC ACCESS RIGHT  06/09/2017  . LAPAROTOMY  01/03/2012   Procedure: EXPLORATORY LAPAROTOMY;  Surgeon: Pedro Earls, MD;  Location: [facility];  Service: General;  Laterality: N/A;  debridement of necrotic pancreas and placement of drains x2  . pancreatic cyst removed   2015  . SHOULDER SURGERY Left 02/18/2010   clavical repair  . TUBAL LIGATION    . VENTRAL HERNIA REPAIR N/A 07/09/2016   Procedure: LAPAROSCOPIC LYSIS OF ADHESIONS  VENTRAL WALL HERNIA REPAIR WITH MESH EXCISION OF HERNIA [place];  Surgeon: Michael Boston, MD;  Location: [facility];  Service: General;  Laterality: N/A;    Social History: Social History   Tobacco Use  . Smoking status: Heavy Tobacco Smoker    Packs/day: 1.00    Years: 15.00    Pack years: 15.00    Types: Cigarettes  . Smokeless tobacco: Never Used  . Tobacco comment: smoking a pack per day now  Substance Use Topics  . Alcohol use: Yes    Alcohol/week: 1.5 - 2.0 oz    Types: 3 - 4 Standard drinks or equivalent per week    Comment: quit 2016 - 2 drinks per week   . Drug use: Yes    Types: Marijuana    Comment: occasional   Additional social history: Lives at home with her friend Legrand Como at bedside.  Endorses drinking alcohol and continued  tobacco use.  Says that she is able to perform all of her ADLs and IADLs until the past week due to weakness. Please also refer to relevant sections of EMR.  Family History: Family History  Problem Relation Age of Onset  . Stroke Mother   . Heart disease Father   . Heart failure Father   . Heart disease Sister    (If not completed, MUST add something in)  Allergies and Medications: Allergies  Allergen Reactions  . Ciprofloxacin Nausea Only   No current facility-administered medications on file prior to encounter.    Current Outpatient Medications on File Prior to Encounter  Medication Sig Dispense Refill  . Calcium Carbonate-Vitamin D (CALCIUM 600+D PO) Take 1 tablet by mouth daily.    Marland Kitchen dexamethasone (DECADRON) 6 MG tablet Take 5 pills today with food, then 3 pills a day with food. (Patient taking differently: Take 6 mg by mouth daily. ) 90 tablet 4  . diphenoxylate-atropine (LOMOTIL) 2.5-0.025 MG tablet Take 2 tablets by mouth 4 (four) times daily as needed for diarrhea or loose stools. 30 tablet 0  . EQ NICOTINE 14 MG/24HR patch Place 14 mg onto the skin daily.   0  . fentaNYL (DURAGESIC - DOSED MCG/HR) 75 MCG/HR APPLY 1 PATCH TOPICALLY EVERY 72 HOURS 10 patch 0  . furosemide (LASIX) 20 MG tablet Take 1 tablet (20 mg total) by mouth daily. 10 tablet 0  . ibuprofen (ADVIL,MOTRIN) 200 MG tablet Take 200 mg by mouth every 6 (six) hours as needed for moderate pain.    Marland Kitchen LORazepam (ATIVAN) 1 MG tablet Take 1 tablet (1 mg total) by mouth 3 (three) times daily as needed. for anxiety (Patient taking differently: Take 1 mg by mouth 4 (four) times daily. for anxiety) 90 tablet 0  . oxyCODONE 20 MG TABS Take 1 tablet (20 mg total) by mouth every 6 (six) hours as needed for severe pain. (Patient taking differently: Take 20 mg by mouth every 6 (six) hours. ) 120 tablet 0  . valACYclovir (VALTREX) 1000 MG tablet Take 1 tablet (1,000 mg total) by mouth daily as needed (fever blisters). 30 tablet 0   . amoxicillin-clavulanate (AUGMENTIN) 875-125 MG tablet Take 1 tablet by mouth 2 (two) times daily. (Patient not taking: Reported on 06/13/2017) 20 tablet 0  . docusate sodium (COLACE) 250 MG capsule Take 1 capsule (250 mg total) by mouth daily. While taking pain medication (Patient not taking: Reported on 06/13/2017) 30 capsule 0  . prochlorperazine (COMPAZINE) 10 MG tablet Take 1 tablet (10 mg total) by mouth every 6 (  six) hours as needed for nausea or vomiting. (Patient not taking: Reported on 06/13/2017) 30 tablet 1    Objective: BP 107/87 (BP Location: Left Arm)   Pulse (!) 104   Temp 98.2 F (36.8 C) (Oral)   Resp 20   Ht 5\' 6"  (1.676 m)   Wt 149 lb 4 oz (67.7 kg) Comment: scale A  LMP 12/08/2006   SpO2 95%   BMI 24.09 kg/m  Exam: Physical Exam  Constitutional: She is oriented to person, place, and time.  Non-toxic appearance. No distress.  Able to speak in full sentences and mentating well.   HENT:  Head: Normocephalic and atraumatic.  Eyes: EOM are normal. Pupils are equal, round, and reactive to light.  Neck: Normal range of motion. Neck supple.  Cardiovascular: Normal rate and regular rhythm. Exam reveals no friction rub.  No murmur heard. Pulmonary/Chest: Effort normal. No stridor. She has decreased breath sounds in the right lower field. She has no wheezes. She exhibits tenderness.  Comfortable work of breathing. Speaks in full sentences    Abdominal: Soft. She exhibits no distension. There is no tenderness. There is no guarding.  Central midline surgical scar  Musculoskeletal:  4/ 5 strength in upper and lower extremities 1-2+ edema in feet up to ankle  Neurological: She is alert and oriented to person, place, and time.  Skin: Skin is warm and dry. Capillary refill takes 2 to 3 seconds. She is not diaphoretic.  Psychiatric: She has a normal mood and affect. Her behavior is normal.    Labs and Imaging: CBC BMET  Recent Labs  Lab 06/13/17 0944  WBC 15.3*  HGB  10.6*  HCT 32.8*  PLT 98*   Recent Labs  Lab 06/13/17 0944  NA 132*  K 3.9  CL 95*  CO2 24  BUN 12  CREATININE 0.60  GLUCOSE 129*  CALCIUM 8.9     Dg Chest 2 View  Result Date: 06/13/2017 CLINICAL DATA:  Shortness of breath.  History of lung cancer. EXAM: CHEST  2 VIEW COMPARISON:  August 01, 2014 FINDINGS: No pneumothorax. The heart, hila, and mediastinum are unremarkable. Small hiatal hernia. The nodularity in the right upper lung on the recent PET-CT is not appreciated on this study. IMPRESSION: No cause for shortness of breath or chest pain identified. The no nodularity in the right apex is not well assessed with this study. Electronically Signed   By: Dorise Bullion III M.D   On: 06/13/2017 10:29   Ct Angio Chest Pe W And/or Wo Contrast  Result Date: 06/13/2017 CLINICAL DATA:  Shortness of breath.  Suspected PE. EXAM: CT ANGIOGRAPHY CHEST WITH CONTRAST TECHNIQUE: Multidetector CT imaging of the chest was performed using the standard protocol during bolus administration of intravenous contrast. Multiplanar CT image reconstructions and MIPs were obtained to evaluate the vascular anatomy. CONTRAST:  33mL ISOVUE-370 IOPAMIDOL (ISOVUE-370) INJECTION 76% COMPARISON:  Chest CT May 25, 2017. Chest x-ray June 13, 2017 FINDINGS: Cardiovascular: The heart and coronary arteries are unchanged. Atherosclerotic changes are seen in the nonaneurysmal aorta. 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 No dissection. There is narrowing of right upper lobe pulmonary arterial branches due to soft tissue in the right hilum. Pulmonary emboli are seen in the right lung bases involving segmental and more peripheral branches. No other emboli identified. Mediastinum/Nodes: There is soft tissue in the right hilum which encases the right upper lobe bronchus and upper lobe pulmonary arterial branches. This soft tissue measures up to 2.8 cm.  No other adenopathy. There is a moderate hiatal hernia. The  thyroid is normal. A Port-A-Cath is identified. No effusions. Lungs/Pleura: The spiculated nodule in the right upper lobe measures 1.6 by 1.0 cm today versus 1.4 x 0.9 cm previously. The small difference could be due to difference in slice selection given the short interval. Scattered scarring. Stable 5 mm nodule in the right lung on series 6, image 77. Other nodules are stable as well. No new masses. Mild ground-glass in the medial right lung on series 6, image 102 is new and could be infectious. No new nodules. No other infiltrates. Upper Abdomen: Air in the liver is stable, likely from previous sphincterotomy. Prominence of the left adrenal gland is likely hyperplasia without nodularity. No other abnormalities in the upper abdomen. Musculoskeletal: Bony metastatic disease again identified in the spine, manubrium, sternum, and ribs. Review of the MIP images confirms the above findings. IMPRESSION: 1. Pulmonary emboli in the right lower lobe involving segmental and more peripheral branches. 2. Mild ground-glass in the medial right lung could be infectious. 3. The spiculated nodule in the right upper lobe is not significantly changed accounting for difference in technique. 4. Increasing soft tissue in the right hilum with encasement of the right upper lobe bronchus and right upper lobe pulmonary arterial branches resulting in some narrowing. This is suspicious for disease involvement. 5. Known bony metastatic disease. 6. Atherosclerotic changes in the nonaneurysmal aorta. 7. Moderate hiatal hernia. Findings called to and discussed with Dr. Ashok Cordia Aortic Atherosclerosis (ICD10-I70.0). Electronically Signed   By: Dorise Bullion III M.D   On: 06/13/2017 13:30    Everrett Coombe, MD 06/13/2017, 6:18 PM PGY-1, [place] [facility] pager: [phone], text pages welcome  I have separately seen and examined the patient. I have discussed the findings and exam with Dr. Grandville Silos and agree with the above  note in its edited form.  My changes/additions are outlined in BLUE.   Everrett Coombe, MD PGY-2 [facility]

## 2017-06-13 NOTE — Progress Notes (Signed)
Report received from ED RN Rod Holler.

## 2017-06-13 NOTE — Progress Notes (Signed)
ANTICOAGULATION CONSULT NOTE - Initial Consult  Pharmacy Consult for lovenox Indication: pulmonary embolus  Allergies  Allergen Reactions  . Ciprofloxacin Nausea Only    Patient Measurements: Height: 5\' 6"  (167.6 cm) Weight: 149 lb 4 oz (67.7 kg)(scale A) IBW/kg (Calculated) : 59.3   Vital Signs: Temp: 98.2 F (36.8 C) (02/24 1557) Temp Source: Oral (02/24 1557) BP: 107/87 (02/24 1557) Pulse Rate: 104 (02/24 1557)  Labs: Recent Labs    06/13/17 0944  HGB 10.6*  HCT 32.8*  PLT 98*  CREATININE 0.60    Estimated Creatinine Clearance: 68.3 mL/min (by C-G formula based on SCr of 0.6 mg/dL).   Medical History: Past Medical History:  Diagnosis Date  . Alcohol abuse    H/o withdrawal   . Alcohol withdrawal ([place]) 10/02/2013  . Anxiety   . Cancer [facility])    unknown primary. known bone mets.  . Chronic diarrhea    hx of  . Constipation   . Depression   . Elevated liver function tests few yrs ago  . Metastatic lung cancer (metastasis from lung to other site), right ([place]) 06/01/2017  . Pancreatitis   . PONV (postoperative nausea and vomiting)   . Rosacea   . Wears glasses     Medications:  Medications Prior to Admission  Medication Sig Dispense Refill Last Dose  . Calcium Carbonate-Vitamin D (CALCIUM 600+D PO) Take 1 tablet by mouth daily.   Past Week at Unknown time  . dexamethasone (DECADRON) 6 MG tablet Take 5 pills today with food, then 3 pills a day with food. (Patient taking differently: Take 6 mg by mouth daily. ) 90 tablet 4 06/13/2017 at Unknown time  . diphenoxylate-atropine (LOMOTIL) 2.5-0.025 MG tablet Take 2 tablets by mouth 4 (four) times daily as needed for diarrhea or loose stools. 30 tablet 0 Past Week at Unknown time  . EQ NICOTINE 14 MG/24HR patch Place 14 mg onto the skin daily.   0 06/12/2017 at Unknown time  . fentaNYL (DURAGESIC - DOSED MCG/HR) 75 MCG/HR APPLY 1 PATCH TOPICALLY EVERY 72 HOURS 10 patch 0 06/12/2017 at Unknown time  . furosemide  (LASIX) 20 MG tablet Take 1 tablet (20 mg total) by mouth daily. 10 tablet 0 06/13/2017 at Unknown time  . ibuprofen (ADVIL,MOTRIN) 200 MG tablet Take 200 mg by mouth every 6 (six) hours as needed for moderate pain.   06/12/2017 at Unknown time  . LORazepam (ATIVAN) 1 MG tablet Take 1 tablet (1 mg total) by mouth 3 (three) times daily as needed. for anxiety (Patient taking differently: Take 1 mg by mouth 4 (four) times daily. for anxiety) 90 tablet 0 06/13/2017 at Unknown time  . oxyCODONE 20 MG TABS Take 1 tablet (20 mg total) by mouth every 6 (six) hours as needed for severe pain. (Patient taking differently: Take 20 mg by mouth every 6 (six) hours. ) 120 tablet 0 06/13/2017 at Unknown time  . valACYclovir (VALTREX) 1000 MG tablet Take 1 tablet (1,000 mg total) by mouth daily as needed (fever blisters). 30 tablet 0 prn at prn  . amoxicillin-clavulanate (AUGMENTIN) 875-125 MG tablet Take 1 tablet by mouth 2 (two) times daily. (Patient not taking: Reported on 06/13/2017) 20 tablet 0 Not Taking at Unknown time  . docusate sodium (COLACE) 250 MG capsule Take 1 capsule (250 mg total) by mouth daily. While taking pain medication (Patient not taking: Reported on 06/13/2017) 30 capsule 0 Not Taking at Unknown time  . prochlorperazine (COMPAZINE) 10 MG tablet Take 1 tablet (  10 mg total) by mouth every 6 (six) hours as needed for nausea or vomiting. (Patient not taking: Reported on 06/13/2017) 30 tablet 1 Not Taking at Unknown time   Scheduled:  . [START ON 06/14/2017] calcium-vitamin D   Oral Daily  . ipratropium-albuterol  3 mL Nebulization Q6H  . levofloxacin  750 mg Oral Daily  . [START ON 06/14/2017] predniSONE  50 mg Oral Q breakfast    Assessment: 62 yo female with PE to begin lovenox (patient noted with stage IV lung CA) -hg= 10.6, plt= 98 (appears has had recent chemotherapy) -CrCl ~ 70  Goal of Therapy:  Monitor platelets by anticoagulation protocol: Yes   Plan:  -lovenox 70mg  [place] q12h -CBC every 3  days  Hildred Laser, Pharm D 06/13/2017 4:40 PM

## 2017-06-13 NOTE — ED Provider Notes (Signed)
[facility] EMERGENCY DEPARTMENT Provider Note   CSN: [id number] Arrival date & time: 06/13/17  8563     History   Chief Complaint Chief Complaint  Patient presents with  . Shortness of Breath  . Chest Pain    HPI [name] is a 62 y.o. female.  Patient w hx stage IV lung cancer, presents c/o increased cough, and sob in the past few days. Symptoms moderate, persistent, worse today. +smoking hx, denies hx asthma or copd. w coughing spells, pain in diffuse chest. No calf pain or swelling. No hx dvt or pe. Denies sore throat, runny nose, fevers or other flu symptoms.  Is getting radiation therapy for met ca/bone mets, but was to start chemo this coming week.    The history is provided by the patient.  Shortness of Breath  Associated symptoms include cough and chest pain. Pertinent negatives include no fever, no headaches, no sore throat, no neck pain, no abdominal pain and no rash.  Chest Pain   Associated symptoms include cough and shortness of breath. Pertinent negatives include no abdominal pain, no fever and no headaches.    Past Medical History:  Diagnosis Date  . Alcohol abuse    H/o withdrawal   . Alcohol withdrawal ([place]) 10/02/2013  . Anxiety   . Cancer [facility])    unknown primary. known bone mets.  . Chronic diarrhea    hx of  . Constipation   . Depression   . Elevated liver function tests few yrs ago  . Metastatic lung cancer (metastasis from lung to other site), right ([place]) 06/01/2017  . Pancreatitis   . PONV (postoperative nausea and vomiting)   . Rosacea   . Wears glasses     Patient Active Problem List   Diagnosis Date Noted  . Abdominal pain 06/02/2017  . Metastatic lung cancer (metastasis from lung to other site), right ([place]) 06/01/2017  . Adenocarcinoma of unknown primary ([place]) 05/13/2017  . Osseous metastasis ([place]) 05/13/2017  . Pancreatic pain 03/31/2017  . Sciatica of right side 02/25/2017  . Tobacco use disorder  02/25/2017  . Incisional hernia s/p lap/open repair with mesh 07/09/2016 07/09/2016  . Chronic pancreatitis ([place]) 04/18/2016  . Pancreatic calcification 04/03/2016  . Abdominal pain, right upper quadrant 04/03/2016  . Counseling regarding goals of care 04/03/2016  . Major depressive disorder, recurrent, severe without psychotic features ([place])   . Substance induced mood disorder ([place]) 11/06/2014  . Bilateral knee pain 10/14/2014  . Alcohol use disorder, severe, dependence ([place]) 08/17/2014  . Suicidal behavior   . Alcohol dependence ([place]) 10/02/2013  . Major depression, recurrent ([place]) 10/02/2013  . Gastric ulcer 01/20/2012  . Fatty liver 10/11/2011  . 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 Elevated liver function tests   . GERD (gastroesophageal reflux disease)   . ESOPHAGEAL MOTILITY DISORDER 10/01/2008  . HIATAL HERNIA 10/01/2008  . FATTY LIVER DISEASE 10/01/2008    Past Surgical History:  Procedure Laterality Date  . CHOLECYSTECTOMY    . COLONOSCOPY  Never  . DIAGNOSTIC MAMMOGRAM  2008  . ERCP  10/06/2011   Procedure: ENDOSCOPIC RETROGRADE CHOLANGIOPANCREATOGRAPHY (ERCP);  Surgeon: Beryle Beams, MD;  Location: [facility];  Service: Endoscopy;  Laterality: N/A;  . ESOPHAGOGASTRODUODENOSCOPY  03/21/2011   Procedure: ESOPHAGOGASTRODUODENOSCOPY (EGD);  Surgeon: Scarlette Shorts, MD;  Location: [facility] ENDOSCOPY;  Service: Endoscopy;  Laterality: N/A;  Patient may need to be done at bedside tomorrow depending on status  . ESOPHAGOGASTRODUODENOSCOPY  01/06/2012   Procedure: ESOPHAGOGASTRODUODENOSCOPY (EGD);  Surgeon: Wonda Horner, MD;  Location: [facility] ENDOSCOPY;  Service: Endoscopy;  Laterality: N/A;  bedside  . HERNIA REPAIR    . INSERTION OF MESH  07/09/2016   Procedure: INSERTION OF MESH;  Surgeon: Michael Boston, MD;  Location: [facility] ORS;  Service: General;;  . IR FLUORO GUIDE PORT INSERTION RIGHT  06/09/2017  . IR US GUIDE VASC ACCESS RIGHT  06/09/2017  . LAPAROTOMY  01/03/2012   Procedure:  EXPLORATORY LAPAROTOMY;  Surgeon: Pedro Earls, MD;  Location: [facility] ORS;  Service: General;  Laterality: N/A;  debridement of necrotic pancreas and placement of drains x2  . pancreatic cyst removed   2015  . SHOULDER SURGERY Left 02/18/2010   clavical repair  . TUBAL LIGATION    . VENTRAL HERNIA REPAIR N/A 07/09/2016   Procedure: LAPAROSCOPIC LYSIS OF ADHESIONS  VENTRAL WALL HERNIA REPAIR WITH MESH EXCISION OF HERNIA [place];  Surgeon: Michael Boston, MD;  Location: [facility] ORS;  Service: General;  Laterality: N/A;    OB History    No data available       Home Medications    Prior to Admission medications   Medication Sig Start Date End Date Taking? Authorizing Provider  amoxicillin-clavulanate (AUGMENTIN) 875-125 MG tablet Take 1 tablet by mouth 2 (two) times daily. 06/02/17   Hayden Pedro, PA-C  Calcium Carbonate-Vitamin D (CALCIUM 600+D PO) Take 1 tablet by mouth daily.    [provider]  dexamethasone (DECADRON) 6 MG tablet Take 5 pills today with food, then 3 pills a day with food. 05/10/17   Volanda Napoleon, MD  diphenoxylate-atropine (LOMOTIL) 2.5-0.025 MG tablet Take 2 tablets by mouth 4 (four) times daily as needed for diarrhea or loose stools. 06/07/17   Gery Pray, MD  docusate sodium (COLACE) 250 MG capsule Take 1 capsule (250 mg total) by mouth daily. While taking pain medication 03/30/17   Duffy Bruce, MD  EQ NICOTINE 14 MG/24HR patch  03/31/17   [provider]  fentaNYL (DURAGESIC - DOSED MCG/HR) 75 MCG/HR APPLY 1 PATCH TOPICALLY EVERY 72 HOURS 06/07/17   Gery Pray, MD  furosemide (LASIX) 20 MG tablet Take 1 tablet (20 mg total) by mouth daily. 06/07/17   Gery Pray, MD  loperamide (IMODIUM A-D) 2 MG tablet Take 2 mg by mouth as needed for diarrhea or loose stools.    [provider]  LORazepam (ATIVAN) 1 MG tablet Take 1 tablet (1 mg total) by mouth 3 (three) times daily as needed. for anxiety 06/03/17   Volanda Napoleon, MD  Multiple  Vitamin (MULTIVITAMIN WITH MINERALS) TABS tablet Take 1 tablet by mouth daily.    [provider]  oxyCODONE 20 MG TABS Take 1 tablet (20 mg total) by mouth every 6 (six) hours as needed for severe pain. 06/08/17   Volanda Napoleon, MD  prochlorperazine (COMPAZINE) 10 MG tablet Take 1 tablet (10 mg total) by mouth every 6 (six) hours as needed for nausea or vomiting. 05/13/17   Gery Pray, MD  valACYclovir (VALTREX) 1000 MG tablet Take 1 tablet (1,000 mg total) by mouth daily as needed (fever blisters). 06/04/17   Bruning, Ailene Ards, PA-C    Family History Family History  Problem Relation Age of Onset  . Stroke Mother   . Heart disease Father   . Heart failure Father   . Heart disease Sister     Social History Social History   Tobacco Use  . Smoking status: Heavy Tobacco Smoker    Packs/day: 1.00  Years: 15.00    Pack years: 15.00    Types: Cigarettes  . Smokeless tobacco: Never Used  . Tobacco comment: smoking a pack per day now  Substance Use Topics  . Alcohol use: Yes    Alcohol/week: 1.5 - 2.0 oz    Types: 3 - 4 Standard drinks or equivalent per week    Comment: quit 2016 - 2 drinks per week   . Drug use: Yes    Types: Marijuana    Comment: occasional     Allergies   Ciprofloxacin   Review of Systems Review of Systems  Constitutional: Negative for fever.  HENT: Negative for sore throat.   Eyes: Negative for redness.  Respiratory: Positive for cough and shortness of breath.   Cardiovascular: Positive for chest pain.  Gastrointestinal: Negative for abdominal pain.  Genitourinary: Negative for flank pain.  Musculoskeletal: Negative for neck pain.  Skin: Negative for rash.  Neurological: Negative for headaches.  Hematological: Does not bruise/bleed easily.  Psychiatric/Behavioral: Negative for confusion.     Physical Exam Updated Vital Signs BP (!) 143/67   Pulse 98   Temp 99.1 F (37.3 C) (Oral)   Resp 20   Ht 1.676 m (_0 )   Wt 70.3 kg  (155 lb)   LMP 12/08/2006   SpO2 98%   BMI 25.02 kg/m   Physical Exam  Constitutional: She appears well-developed and well-nourished. No distress.  HENT:  Mouth/Throat: Oropharynx is clear and moist.  Eyes: Conjunctivae are normal. No scleral icterus.  Neck: Neck supple. No tracheal deviation present.  Cardiovascular: Normal rate, regular rhythm, normal heart sounds and intact distal pulses. Exam reveals no gallop and no friction rub.  No murmur heard. Pulmonary/Chest: She is in respiratory distress. She has wheezes.  Abdominal: Soft. Normal appearance. She exhibits no distension. There is no tenderness.  Musculoskeletal:  Mild symmetric bilateral ankle swelling.   Neurological: She is alert.  Skin: Skin is warm and dry. No rash noted. She is not diaphoretic.  Psychiatric: She has a normal mood and affect.  Nursing note and vitals reviewed.    ED Treatments / Results  Labs (all labs ordered are listed, but only abnormal results are displayed) Results for orders placed or performed during the hospital encounter of 87/86/76  Basic metabolic panel  Result Value Ref Range   Sodium 132 (L) 135 - 145 mmol/L   Potassium 3.9 3.5 - 5.1 mmol/L   Chloride 95 (L) 101 - 111 mmol/L   CO2 24 22 - 32 mmol/L   Glucose, Bld 129 (H) 65 - 99 mg/dL   BUN 12 6 - 20 mg/dL   Creatinine, Ser 0.60 0.44 - 1.00 mg/dL   Calcium 8.9 8.9 - 10.3 mg/dL   GFR calc non Af Amer >60 >60 mL/min   GFR calc Af Amer >60 >60 mL/min   Anion gap 13 5 - 15  CBC  Result Value Ref Range   WBC 15.3 (H) 4.0 - 10.5 K/uL   RBC 3.73 (L) 3.87 - 5.11 MIL/uL   Hemoglobin 10.6 (L) 12.0 - 15.0 g/dL   HCT 32.8 (L) 36.0 - 46.0 %   MCV 87.9 78.0 - 100.0 fL   MCH 28.4 26.0 - 34.0 pg   MCHC 32.3 30.0 - 36.0 g/dL   RDW 16.2 (H) 11.5 - 15.5 %   Platelets 98 (L) 150 - 400 K/uL  I-stat troponin, ED  Result Value Ref Range   Troponin i, poc 0.00 0.00 - 0.08 ng/mL  Comment 3           Ct Chest W Contrast  Result Date:  05/25/2017 CLINICAL DATA:  Unknown primary with bone metastasis. EXAM: CT CHEST, ABDOMEN, AND PELVIS WITH CONTRAST TECHNIQUE: Multidetector CT imaging of the chest, abdomen and pelvis was performed following the standard protocol during bolus administration of intravenous contrast. CONTRAST:  135m ISOVUE-300 IOPAMIDOL (ISOVUE-300) INJECTION 61% COMPARISON:  03/25/2016 FINDINGS: CT CHEST FINDINGS Cardiovascular: The heart size appears within normal limits. Aortic atherosclerosis. Mediastinum/Nodes: The trachea appears patent and is midline. Moderate hiatal hernia. No axillary or supraclavicular adenopathy. No enlarged mediastinal lymph nodes. 1.9 cm right hilar lymph node is identified, image 73 of series 4. No left hilar adenopathy. Lungs/Pleura: Spiculated nodule within the right upper lobe measures 1.4 by 0.9 by 1.6 cm, image 62 of series 4. Post inflammatory scarring identified within the anterior and anteromedial right upper lobe. Small right upper lobe nodule measures 6 mm, image 80 of series 6. Musculoskeletal: Multifocal sclerotic and lucent lesions are identified within the thoracic spine. This includes a 9 mm lesion within the T6 vertebra, image 79 of series 5. No pathologic fractures identified. Abnormal sclerosis involving the anterolateral aspect of the left fifth and seventh ribs identified. CT ABDOMEN PELVIS FINDINGS Hepatobiliary: Pneumobilia identified. Calcified granuloma identified within the medial segment of left lobe. No suspicious liver lesions identified. Previous cholecystectomy. The common bile duct is increased in caliber measuring up to 1.5 cm. Pancreas: Changes of chronic pancreatitis identified involving the head neck and body. A stent is identified within the main duct. No evidence for acute inflammation or mass. Spleen: Normal in size without focal abnormality. Adrenals/Urinary Tract: Nodular enlargement of the left adrenal gland is noted. Normal appearance of the right adrenal gland.  No kidney mass or hydronephrosis identified. Two subcentimeter low-density foci within the left kidney are too small to characterize. Urinary bladder appears normal. Stomach/Bowel: Moderate to large hiatal hernia. The small bowel loops have a normal course and caliber. The appendix is visualized and appears normal. No pathologic dilatation the colon. Numerous colonic diverticula noted involving the distal colon. Vascular/Lymphatic: Aortic atherosclerosis. No aneurysm. No pelvic or inguinal adenopathy identified. Reproductive: Uterus and bilateral adnexa are unremarkable. Other: No free fluid or fluid collections. Musculoskeletal: Mixed lytic and sclerotic bone metastases identified involving the lumbar spine and bony pelvis. Index mixed lytic and sclerotic bone lesion involving the posterior column of the left acetabulum the lung is identified, image 114 of series 2. Large sclerotic lesion within the right iliac bone measures 4.3 cm, image 94 of series 2. Right iliac wing sclerotic lesion measures 1.4 cm. IMPRESSION: 1. There is a suspicious spiculated nodule within the right upper lobe which may represent a primary lung neoplasm. Pathologic enlargement of the right hilar lymph node is also identified. Further evaluation with PET-CT and tissue sampling advise. 2. A mixed lytic and sclerotic pattern of bone metastasis is identified within the axial and appendicular skeleton. 3. Moderate to large hiatal hernia 4.  Aortic Atherosclerosis (ICD10-I70.0). 5. Changes of chronic pancreatitis. 6. Nonspecific nodular enlargement of the left adrenal gland. Attention to this area on PET-CT would be advised. Electronically Signed   By: TKerby MoorsM.D.   On: 05/25/2017 12:13   Mr BJeri CosWLJContrast  Result Date: 06/06/2017 CLINICAL DATA:  Non-small cell lung cancer staging EXAM: MRI HEAD WITHOUT AND WITH CONTRAST TECHNIQUE: Multiplanar, multiecho pulse sequences of the brain and surrounding structures were obtained  without and with intravenous contrast. CONTRAST:  67m MULTIHANCE GADOBENATE DIMEGLUMINE 529 MG/ML IV SOLN COMPARISON:  Head CT 03/02/2010 FINDINGS: Brain: The midline structures are normal. There is no acute infarct or acute hemorrhage. No mass lesion, hydrocephalus, dural abnormality or extra-axial collection. The brain parenchymal signal is normal. No age-advanced or lobar predominant atrophy. No chronic microhemorrhage or superficial siderosis. Vascular: Major intracranial arterial and venous sinus flow voids are preserved. Skull and upper cervical spine: There are multiple T1 hyperintense foci within the calvarium, the largest in the right parietal bone. This also shows intrinsic T2 hyperintensity, likely a hemangioma. Sinuses/Orbits: Mild left sphenoid sinus mucosal thickening. No mastoid or middle ear effusion. Normal orbits. IMPRESSION: 1. No intracranial metastatic disease.  Normal brain. 2. 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 Electronically Signed   By: KUlyses JarredM.D.   On: 06/06/2017 01:26   Ct Abdomen Pelvis W Contrast  Result Date: 06/02/2017 CLINICAL DATA:  Left upper quadrant abdominal pain and distention. Recently diagnosed with metastatic adenocarcinoma of unknown primary. Evaluate for diverticulitis. EXAM: CT ABDOMEN AND PELVIS WITH CONTRAST TECHNIQUE: Multidetector CT imaging of the abdomen and pelvis was performed using the standard protocol following bolus administration of intravenous contrast. CONTRAST:  108mISOVUE-300 IOPAMIDOL (ISOVUE-300) INJECTION 61% COMPARISON:  PET-CT 05/26/2017. Abdominopelvic CT 05/25/2017 and 03/25/2016. FINDINGS: Lower chest: Patchy ground-glass opacities at both lung bases are stable. There is no significant pleural effusion. Moderate-sized hiatal hernia again noted. Hepatobiliary: The liver is normal in density without focal abnormality. Stable mild biliary dilatation and pneumobilia status  post cholecystectomy. Pancreas: Multiple calcifications are present within the pancreatic head. There is a pancreatic stent in place with a small amount of air in the main pancreatic duct. The pancreatic body and tail appear normal. No acute surrounding inflammation. Spleen: Scattered small granulomas.  Normal in size. Adrenals/Urinary Tract: Stable generalized enlargement of the left adrenal gland consistent with hyperplasia. The right adrenal gland appears normal. Tiny low-density left renal lesions remain too small to characterize, but stable. No hydronephrosis or urinary tract calculus. The bladder appears normal. Stomach/Bowel: As above, moderate-size hiatal hernia. The stomach and small bowel appear normal. The proximal colon is fluid-filled. There are diverticular changes of the sigmoid colon with a fluid-filled inferiorly projecting diverticulum on axial image 64. There may be minimal inflammation in the surrounding fat. No evidence of bowel obstruction. Vascular/Lymphatic: There are no enlarged abdominal or pelvic lymph nodes. Scattered small retroperitoneal lymph nodes are stable. There is diffuse aortic and branch vessel atherosclerosis. No acute vascular findings. Reproductive: The uterus and ovaries appear normal. No adnexal mass. Probable pelvic floor laxity. Other: There is an enhancing soft tissue nodule in the anterior abdominal wall near the umbilicus, measuring 16 mm on image 41. Mildly hypermetabolic on recent PET-CT, probably a metastasis. No ascites, free air or peritoneal nodularity. Musculoskeletal: Multifocal osseous metastatic disease, similar to recent studies. No pathologic fracture identified. IMPRESSION: 1. Possible mild sigmoid diverticulitis. No evidence of perforation, abscess or obstruction. 2. No other evidence of acute process. 3. Known metastatic disease to the bones. Probable enhancing metastasis within the anterior abdominal wall near the umbilicus. 4. Stable incidental  findings including a moderate size hiatal hernia, mild biliary dilatation status post cholecystectomy, sequela of chronic calcific pancreatitis, left adrenal hyperplasia and Aortic Atherosclerosis (ICD10-I70.0). Electronically Signed   By: WiRichardean Sale.D.   On: 06/02/2017 15:48   Ct Abdomen Pelvis W Contrast  Result Date: 05/25/2017 CLINICAL DATA:  Unknown primary with bone metastasis. EXAM: CT CHEST,  ABDOMEN, AND PELVIS WITH CONTRAST TECHNIQUE: Multidetector CT imaging of the chest, abdomen and pelvis was performed following the standard protocol during bolus administration of intravenous contrast. CONTRAST:  139m ISOVUE-300 IOPAMIDOL (ISOVUE-300) INJECTION 61% COMPARISON:  03/25/2016 FINDINGS: CT CHEST FINDINGS Cardiovascular: The heart size appears within normal limits. Aortic atherosclerosis. Mediastinum/Nodes: The trachea appears patent and is midline. Moderate hiatal hernia. No axillary or supraclavicular adenopathy. No enlarged mediastinal lymph nodes. 1.9 cm right hilar lymph node is identified, image 73 of series 4. No left hilar adenopathy. Lungs/Pleura: Spiculated nodule within the right upper lobe measures 1.4 by 0.9 by 1.6 cm, image 62 of series 4. Post inflammatory scarring identified within the anterior and anteromedial right upper lobe. Small right upper lobe nodule measures 6 mm, image 80 of series 6. Musculoskeletal: Multifocal sclerotic and lucent lesions are identified within the thoracic spine. This includes a 9 mm lesion within the T6 vertebra, image 79 of series 5. No pathologic fractures identified. Abnormal sclerosis involving the anterolateral aspect of the left fifth and seventh ribs identified. CT ABDOMEN PELVIS FINDINGS Hepatobiliary: Pneumobilia identified. Calcified granuloma identified within the medial segment of left lobe. No suspicious liver lesions identified. Previous cholecystectomy. The common bile duct is increased in caliber measuring up to 1.5 cm. Pancreas: Changes  of chronic pancreatitis identified involving the head neck and body. A stent is identified within the main duct. No evidence for acute inflammation or mass. Spleen: Normal in size without focal abnormality. Adrenals/Urinary Tract: Nodular enlargement of the left adrenal gland is noted. Normal appearance of the right adrenal gland. No kidney mass or hydronephrosis identified. Two subcentimeter low-density foci within the left kidney are too small to characterize. Urinary bladder appears normal. Stomach/Bowel: Moderate to large hiatal hernia. The small bowel loops have a normal course and caliber. The appendix is visualized and appears normal. No pathologic dilatation the colon. Numerous colonic diverticula noted involving the distal colon. Vascular/Lymphatic: Aortic atherosclerosis. No aneurysm. No pelvic or inguinal adenopathy identified. Reproductive: Uterus and bilateral adnexa are unremarkable. Other: No free fluid or fluid collections. Musculoskeletal: Mixed lytic and sclerotic bone metastases identified involving the lumbar spine and bony pelvis. Index mixed lytic and sclerotic bone lesion involving the posterior column of the left acetabulum the lung is identified, image 114 of series 2. Large sclerotic lesion within the right iliac bone measures 4.3 cm, image 94 of series 2. Right iliac wing sclerotic lesion measures 1.4 cm. IMPRESSION: 1. There is a suspicious spiculated nodule within the right upper lobe which may represent a primary lung neoplasm. Pathologic enlargement of the right hilar lymph node is also identified. Further evaluation with PET-CT and tissue sampling advise. 2. A mixed lytic and sclerotic pattern of bone metastasis is identified within the axial and appendicular skeleton. 3. Moderate to large hiatal hernia 4.  Aortic Atherosclerosis (ICD10-I70.0). 5. Changes of chronic pancreatitis. 6. Nonspecific nodular enlargement of the left adrenal gland. Attention to this area on PET-CT would be  advised. Electronically Signed   By: TKerby MoorsM.D.   On: 05/25/2017 12:13   Nm Pet Image Initial (pi) Skull Base To Thigh  Result Date: 05/26/2017 CLINICAL DATA:  Initial treatment strategy for adenocarcinoma with bone metastasis. Unknown primary. EXAM: NUCLEAR MEDICINE PET SKULL BASE TO THIGH TECHNIQUE: 7.8 mCi F-18 FDG was injected intravenously. Full-ring PET imaging was performed from the skull base to thigh after the radiotracer. CT data was obtained and used for attenuation correction and anatomic localization. FASTING BLOOD GLUCOSE:  Value: 190 mg/dl  COMPARISON:  None.  Or FINDINGS: NECK No hypermetabolic lymph nodes in the neck. CHEST Linear nodular thickening measuring 15 mm (image 65, series 4) in the RIGHT upper lobe with mild associated metabolic activity (SUV max equal 2.5) No hypermetabolic mediastinal lymph nodes. ABDOMEN/PELVIS No abnormal metabolic activity liver. There is a stent within the pancreas. No focal activity within the pancreas. Adrenal glands are normal. Kidneys are normal. No hypermetabolic abdominopelvic adenopathy. Diverticula of the colon. SKELETON Multiple small smudgy sclerotic lesions within the thoracic and lumbar spine. These have mild to moderate associated metabolic activity. For example, lesion at T6 with SUV max equal 6.2 (most intense). Lesion at L 4 with SUV max equal 3.8. There multiple lesions throughout the sacrum with SUV max equal 5.4. Lesions within the pelvis and proximal femurs IMPRESSION: 1. Widespread skeletal metastasis within the spine, pelvis as well as proximal appendicular skeleton. These lesions have mild to moderate radiotracer activity which is atypical for metastatic disease (typically more intense). 2. Nodular lesion in the RIGHT upper lobe has mild metabolic activity. Typically would favor this lesions to represent benign scarring; however with low metabolic activity of skeletal metastasis this potentially could represent a site of primary  neoplasm. 3. No additional evidence of primary neoplasm on the scan. 4. Hiatal hernia. 5. Stent within the pancreas. Electronically Signed   By: Suzy Bouchard M.D.   On: 05/26/2017 17:33   Ir US Guide Vasc Access Right  Result Date: 06/09/2017 CLINICAL DATA:  Lung cancer EXAM: TUNNEL POWER PORT PLACEMENT WITH SUBCUTANEOUS POCKET UTILIZING ULTRASOUND & FLOUROSCOPY FLUOROSCOPY TIME:  30 seconds.  Five mGy. MEDICATIONS AND MEDICAL HISTORY: Versed 4 mg, Fentanyl 100 mcg. Additional Medications: Ancef 2 g. Antibiotics were given within 2 hours of the procedure. ANESTHESIA/SEDATION: Moderate sedation time: 26 minutes. Nursing monitored the the patient during the procedure. PROCEDURE: After written informed consent was obtained, patient was placed in the supine position on angiographic table. The right neck and chest was prepped and draped in a sterile fashion. Lidocaine was utilized for local anesthesia. The right jugular vein was noted to be patent initially with ultrasound. Under sonographic guidance, a micropuncture needle was inserted into the right IJ vein (Ultrasound and fluoroscopic image documentation was performed). The needle was removed over an 018 wire which was exchanged for a Amplatz. This was advanced into the IVC. An 8-French dilator was advanced over the Amplatz. A small incision was made in the right upper chest over the anterior right second rib. Utilizing blunt dissection, a subcutaneous pocket was created in the caudal direction. The pocket was irrigated with a copious amount of sterile normal saline. The port catheter was tunneled from the chest incision, and out the neck incision. The reservoir was inserted into the subcutaneous pocket and secured with two 3-0 Ethilon stitches. A peel-away sheath was advanced over the Amplatz wire. The port catheter was cut to measure length and inserted through the peel-away sheath. The peel-away sheath was removed. The chest incision was closed with 3-0  Vicryl interrupted stitches for the subcutaneous tissue and a running of 4-0 Vicryl subcuticular stitch for the skin. The neck incision was closed with a 4-0 Vicryl subcuticular stitch. Derma-bond was applied to both surgical incisions. The port reservoir was flushed and instilled with heparinized saline. No complications. FINDINGS: A right IJ vein Port-A-Cath is in place with its tip at the cavoatrial junction. COMPLICATIONS: None IMPRESSION: Successful 8 French right internal jugular vein power port placement with its tip at the SVC/RA junction.  Electronically Signed   By: Marybelle Killings M.D.   On: 06/09/2017 16:41   Mm Screening Breast Tomo Bilateral  Result Date: 06/10/2017 CLINICAL DATA:  Screening. EXAM: DIGITAL SCREENING BILATERAL MAMMOGRAM WITH TOMO AND CAD COMPARISON:  Previous exam(s). ACR Breast Density Category c: The breast tissue is heterogeneously dense, which may obscure small masses. FINDINGS: There are no findings suspicious for malignancy. Images were processed with CAD. IMPRESSION: No mammographic evidence of malignancy. A result letter of this screening mammogram will be mailed directly to the patient. RECOMMENDATION: Screening mammogram in one year. (Code:[id number]-01Y) BI-RADS CATEGORY  1: Negative. Electronically Signed   By: Lajean Manes M.D.   On: 06/10/2017 09:40   Ir Fluoro Guide Port Insertion Right  Result Date: 06/09/2017 CLINICAL DATA:  Lung cancer EXAM: TUNNEL POWER PORT PLACEMENT WITH SUBCUTANEOUS POCKET UTILIZING ULTRASOUND & FLOUROSCOPY FLUOROSCOPY TIME:  30 seconds.  Five mGy. MEDICATIONS AND MEDICAL HISTORY: Versed 4 mg, Fentanyl 100 mcg. Additional Medications: Ancef 2 g. Antibiotics were given within 2 hours of the procedure. ANESTHESIA/SEDATION: Moderate sedation time: 26 minutes. Nursing monitored the the patient during the procedure. PROCEDURE: After written informed consent was obtained, patient was placed in the supine position on angiographic table. The right neck and  chest was prepped and draped in a sterile fashion. Lidocaine was utilized for local anesthesia. The right jugular vein was noted to be patent initially with ultrasound. Under sonographic guidance, a micropuncture needle was inserted into the right IJ vein (Ultrasound and fluoroscopic image documentation was performed). The needle was removed over an 018 wire which was exchanged for a Amplatz. This was advanced into the IVC. An 8-French dilator was advanced over the Amplatz. A small incision was made in the right upper chest over the anterior right second rib. Utilizing blunt dissection, a subcutaneous pocket was created in the caudal direction. The pocket was irrigated with a copious amount of sterile normal saline. The port catheter was tunneled from the chest incision, and out the neck incision. The reservoir was inserted into the subcutaneous pocket and secured with two 3-0 Ethilon stitches. A peel-away sheath was advanced over the Amplatz wire. The port catheter was cut to measure length and inserted through the peel-away sheath. The peel-away sheath was removed. The chest incision was closed with 3-0 Vicryl interrupted stitches for the subcutaneous tissue and a running of 4-0 Vicryl subcuticular stitch for the skin. The neck incision was closed with a 4-0 Vicryl subcuticular stitch. Derma-bond was applied to both surgical incisions. The port reservoir was flushed and instilled with heparinized saline. No complications. FINDINGS: A right IJ vein Port-A-Cath is in place with its tip at the cavoatrial junction. COMPLICATIONS: None IMPRESSION: Successful 8 French right internal jugular vein power port placement with its tip at the SVC/RA junction. Electronically Signed   By: Marybelle Killings M.D.   On: 06/09/2017 16:41    EKG  EKG Interpretation  Date/Time:  Sunday June 13 2017 09:45:45 EST Ventricular Rate:  106 PR Interval:  120 QRS Duration: 72 QT Interval:  318 QTC Calculation: 422 R  Axis:   87 Text Interpretation:  Sinus tachycardia Confirmed by Lajean Saver [id number]) on 06/13/2017 11:43:41 AM       Radiology Dg Chest 2 View  Result Date: 06/13/2017 CLINICAL DATA:  Shortness of breath.  History of lung cancer. EXAM: CHEST  2 VIEW COMPARISON:  August 01, 2014 FINDINGS: No pneumothorax. The heart, hila, and mediastinum are unremarkable. Small hiatal hernia. The nodularity in the right upper  lung on the recent PET-CT is not appreciated on this study. IMPRESSION: No cause for shortness of breath or chest pain identified. The no nodularity in the right apex is not well assessed with this study. Electronically Signed   By: Dorise Bullion III M.D   On: 06/13/2017 10:29   Ct Angio Chest Pe W And/or Wo Contrast  Result Date: 06/13/2017 CLINICAL DATA:  Shortness of breath.  Suspected PE. EXAM: CT ANGIOGRAPHY CHEST WITH CONTRAST TECHNIQUE: Multidetector CT imaging of the chest was performed using the standard protocol during bolus administration of intravenous contrast. Multiplanar CT image reconstructions and MIPs were obtained to evaluate the vascular anatomy. CONTRAST:  61m ISOVUE-370 IOPAMIDOL (ISOVUE-370) INJECTION 76% COMPARISON:  Chest CT May 25, 2017. Chest x-ray June 13, 2017 FINDINGS: Cardiovascular: The heart and coronary arteries are unchanged. Atherosclerotic changes are seen in the nonaneurysmal aorta. 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 No dissection. There is narrowing of right upper lobe pulmonary arterial branches due to soft tissue in the right hilum. Pulmonary emboli are seen in the right lung bases involving segmental and more peripheral branches. No other emboli identified. Mediastinum/Nodes: There is soft tissue in the right hilum which encases the right upper lobe bronchus and upper lobe pulmonary arterial branches. This soft tissue measures up to 2.8 cm. No other adenopathy. There is a moderate hiatal hernia. The thyroid is normal. A Port-A-Cath  is identified. No effusions. Lungs/Pleura: The spiculated nodule in the right upper lobe measures 1.6 by 1.0 cm today versus 1.4 x 0.9 cm previously. The small difference could be due to difference in slice selection given the short interval. Scattered scarring. Stable 5 mm nodule in the right lung on series 6, image 77. Other nodules are stable as well. No new masses. Mild ground-glass in the medial right lung on series 6, image 102 is new and could be infectious. No new nodules. No other infiltrates. Upper Abdomen: Air in the liver is stable, likely from previous sphincterotomy. Prominence of the left adrenal gland is likely hyperplasia without nodularity. No other abnormalities in the upper abdomen. Musculoskeletal: Bony metastatic disease again identified in the spine, manubrium, sternum, and ribs. Review of the MIP images confirms the above findings. IMPRESSION: 1. Pulmonary emboli in the right lower lobe involving segmental and more peripheral branches. 2. Mild ground-glass in the medial right lung could be infectious. 3. The spiculated nodule in the right upper lobe is not significantly changed accounting for difference in technique. 4. Increasing soft tissue in the right hilum with encasement of the right upper lobe bronchus and right upper lobe pulmonary arterial branches resulting in some narrowing. This is suspicious for disease involvement. 5. Known bony metastatic disease. 6. Atherosclerotic changes in the nonaneurysmal aorta. 7. Moderate hiatal hernia. Findings called to and discussed with Dr. SAshok CordiaAortic Atherosclerosis (ICD10-I70.0). Electronically Signed   By: DDorise BullionIII M.D   On: 06/13/2017 13:30    Procedures Procedures (including critical care time)  Medications Ordered in ED Medications  HYDROmorphone (DILAUDID) injection 1 mg (not administered)  ondansetron (ZOFRAN) injection 4 mg (not administered)  albuterol (PROVENTIL) (2.5 MG/3ML) 0.083% nebulizer solution 5 mg (not  administered)  ipratropium (ATROVENT) nebulizer solution 0.5 mg (not administered)     Initial Impression / Assessment and Plan / ED Course  I have reviewed the triage vital signs and the nursing notes.  Pertinent labs & imaging results that were available during my care of the patient were reviewed by me and considered in  my medical decision making (see chart for details).  Iv ns. Continuous pulse ox and monitor. Pt hypoxic on room air - placed on o2 [place].   Albuterol and atrovent neb re diffuse wheezing.  Reviewed nursing notes and prior charts for additional history.   Dilaudid 1 mg iv for pain.  Recheck persistent wheezing/diffuse. Dyspnea persists.  Labs reviewed, wbc high, renal fxn wnl.   Additional albuterol and atrovent neb txs.   Given stage IV cancer, acute dyspnea, will get cta chest.  Reviewed CT, right sided PE, also lung mass, and encasement right-sided bronchi.   Additional neb txs.  lovenox sq.  Medical Service consulted for admission.   CRITICAL CARE RE acute respiratory distress, hypoxia, pulmonary embolus, diffuse bronchospasm/wheezing, stage IV ca.  Performed by: Mirna Mires Total critical care time: 35 minutes Critical care time was exclusive of separately billable procedures and treating other patients. Critical care was necessary to treat or prevent imminent or life-threatening deterioration. Critical care was time spent personally by me on the following activities: development of treatment plan with patient and/or surrogate as well as nursing, discussions with consultants, evaluation of patient's response to treatment, examination of patient, obtaining history from patient or surrogate, ordering and performing treatments and interventions, ordering and review of laboratory studies, ordering and review of radiographic studies, pulse oximetry and re-evaluation of patient's condition.   Final Clinical Impressions(s) / ED Diagnoses   Final diagnoses:    None    ED Discharge Orders    None       Lajean Saver, MD 06/13/17 1353

## 2017-06-13 NOTE — ED Notes (Signed)
Attempted report 

## 2017-06-14 ENCOUNTER — Inpatient Hospital Stay: Payer: BLUE CROSS/BLUE SHIELD

## 2017-06-14 ENCOUNTER — Inpatient Hospital Stay (HOSPITAL_COMMUNITY): Payer: BLUE CROSS/BLUE SHIELD

## 2017-06-14 ENCOUNTER — Encounter: Payer: Self-pay | Admitting: Radiation Oncology

## 2017-06-14 DIAGNOSIS — M7989 Other specified soft tissue disorders: Secondary | ICD-10-CM

## 2017-06-14 DIAGNOSIS — C3411 Malignant neoplasm of upper lobe, right bronchus or lung: Secondary | ICD-10-CM

## 2017-06-14 DIAGNOSIS — D696 Thrombocytopenia, unspecified: Secondary | ICD-10-CM

## 2017-06-14 DIAGNOSIS — C801 Malignant (primary) neoplasm, unspecified: Secondary | ICD-10-CM

## 2017-06-14 DIAGNOSIS — E611 Iron deficiency: Secondary | ICD-10-CM

## 2017-06-14 DIAGNOSIS — R062 Wheezing: Secondary | ICD-10-CM

## 2017-06-14 DIAGNOSIS — Z7901 Long term (current) use of anticoagulants: Secondary | ICD-10-CM

## 2017-06-14 DIAGNOSIS — R609 Edema, unspecified: Secondary | ICD-10-CM

## 2017-06-14 DIAGNOSIS — Z720 Tobacco use: Secondary | ICD-10-CM

## 2017-06-14 LAB — IRON AND TIBC
IRON: 23 ug/dL — AB (ref 28–170)
Saturation Ratios: 10 % — ABNORMAL LOW (ref 10.4–31.8)
TIBC: 235 ug/dL — AB (ref 250–450)
UIBC: 212 ug/dL

## 2017-06-14 LAB — HIV ANTIBODY (ROUTINE TESTING W REFLEX): HIV SCREEN 4TH GENERATION: NONREACTIVE

## 2017-06-14 LAB — BASIC METABOLIC PANEL
ANION GAP: 11 (ref 5–15)
BUN: 10 mg/dL (ref 6–20)
CHLORIDE: 95 mmol/L — AB (ref 101–111)
CO2: 24 mmol/L (ref 22–32)
Calcium: 8.7 mg/dL — ABNORMAL LOW (ref 8.9–10.3)
Creatinine, Ser: 0.49 mg/dL (ref 0.44–1.00)
GFR calc Af Amer: >60 mL/min (ref >60)
GLUCOSE: 97 mg/dL (ref 65–99)
POTASSIUM: 4.1 mmol/L (ref 3.5–5.1)
Sodium: 130 mmol/L — ABNORMAL LOW (ref 135–145)

## 2017-06-14 LAB — CBC
HEMATOCRIT: 29.5 % — AB (ref 36.0–46.0)
HEMOGLOBIN: 9.4 g/dL — AB (ref 12.0–15.0)
MCH: 27.7 pg (ref 26.0–34.0)
MCHC: 31.9 g/dL (ref 30.0–36.0)
MCV: 87 fL (ref 78.0–100.0)
Platelets: 92 10*3/uL — ABNORMAL LOW (ref 150–400)
RBC: 3.39 MIL/uL — ABNORMAL LOW (ref 3.87–5.11)
RDW: 16.1 % — ABNORMAL HIGH (ref 11.5–15.5)
WBC: 18.9 10*3/uL — AB (ref 4.0–10.5)

## 2017-06-14 LAB — FERRITIN: FERRITIN: 296 ng/mL (ref 11–307)

## 2017-06-14 LAB — PROCALCITONIN: Procalcitonin: 0.24 ng/mL

## 2017-06-14 LAB — PROTIME-INR
INR: 1.14
Prothrombin Time: 14.5 seconds (ref 11.4–15.2)

## 2017-06-14 LAB — APTT: APTT: 38 s — AB (ref 24–36)

## 2017-06-14 MED ORDER — HYDROMORPHONE HCL 1 MG/ML IJ SOLN
1.0000 mg | Freq: Once | INTRAMUSCULAR | Status: AC
  Administered 2017-06-14: 1 mg via INTRAVENOUS
  Filled 2017-06-14: qty 1

## 2017-06-14 MED ORDER — FUROSEMIDE 20 MG PO TABS
20.0000 mg | ORAL_TABLET | Freq: Every day | ORAL | Status: DC
  Administered 2017-06-14 – 2017-06-17 (×4): 20 mg via ORAL
  Filled 2017-06-14 (×4): qty 1

## 2017-06-14 MED ORDER — ENSURE ENLIVE PO LIQD
237.0000 mL | Freq: Two times a day (BID) | ORAL | Status: DC
  Administered 2017-06-14 – 2017-06-17 (×3): 237 mL via ORAL

## 2017-06-14 MED ORDER — OXYCODONE HCL 5 MG PO TABS: 20.0000 mg | ORAL_TABLET | Freq: Four times a day (QID) | ORAL | Status: DC

## 2017-06-14 MED ORDER — DEXAMETHASONE 6 MG PO TABS
36.0000 mg | ORAL_TABLET | Freq: Every day | ORAL | Status: DC
  Administered 2017-06-14 – 2017-06-15 (×2): 36 mg via ORAL
  Filled 2017-06-14 (×2): qty 6

## 2017-06-14 MED ORDER — HYDROMORPHONE HCL 1 MG/ML IJ SOLN
1.0000 mg | INTRAMUSCULAR | Status: DC | PRN
  Administered 2017-06-14 – 2017-06-15 (×5): 1 mg via INTRAVENOUS
  Filled 2017-06-14 (×5): qty 1

## 2017-06-14 MED ORDER — OXYCODONE HCL 5 MG PO TABS
20.0000 mg | ORAL_TABLET | Freq: Three times a day (TID) | ORAL | Status: DC
  Administered 2017-06-14 – 2017-06-15 (×3): 20 mg via ORAL
  Filled 2017-06-14 (×3): qty 4

## 2017-06-14 MED ORDER — IPRATROPIUM-ALBUTEROL 0.5-2.5 (3) MG/3ML IN SOLN
3.0000 mL | Freq: Two times a day (BID) | RESPIRATORY_TRACT | Status: DC
  Administered 2017-06-14 – 2017-06-15 (×3): 3 mL via RESPIRATORY_TRACT
  Filled 2017-06-14 (×2): qty 3

## 2017-06-14 MED ORDER — ALBUTEROL SULFATE (2.5 MG/3ML) 0.083% IN NEBU: 2.5000 mg | INHALATION_SOLUTION | RESPIRATORY_TRACT | Status: DC | PRN

## 2017-06-14 MED ORDER — FENTANYL 25 MCG/HR TD PT72
75.0000 ug | MEDICATED_PATCH | TRANSDERMAL | Status: DC
  Administered 2017-06-14 – 2017-06-17 (×2): 75 ug via TRANSDERMAL
  Filled 2017-06-14 (×2): qty 3

## 2017-06-14 NOTE — Progress Notes (Addendum)
Initial Nutrition Assessment  DOCUMENTATION CODES:   Not applicable  INTERVENTION:   -Ensure Enlive po BID, each supplement provides 350 kcal and 20 grams of protein  NUTRITION DIAGNOSIS:   Increased nutrient needs related to chronic illness(stage IV lung cancer) as evidenced by estimated needs.  GOAL:   Patient will meet greater than or equal to 90% of their needs  MONITOR:   PO intake, Supplement acceptance, Labs, Weight trends, Skin, I & O's  REASON FOR ASSESSMENT:   Consult Assessment of nutrition requirement/status  ASSESSMENT:   [name] is a 62 y.o. female presenting with 2 weeks of worsening congestion, cough, shortness of breath. 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 Pt also with stage IV rt lung cancer.   Attempted to examine pt x 2. Pt using bathroom at time of first visit and pt sleeping soundly at time of second visit. Pt appeared to have some mild muscle depletion in lower extremities, but unable to complete nutrition-focused physical exam at this time.  Case discussed with RN, who reports pt with fair appetite. Pt has been requesting water and ice cream between meals. Noted meal completion 100%. Per RN, pt struggling with pain control; palliative care consulted.  Reviewed wt hx; noted pt has experienced a 7% wt loss x 1 month (- 0.82 L since admission).   Due to wt loss and increased nutritional needs for cancer, pt would benefit from oral nutrition supplements.   Labs reviewed: Na: 130.   Diet Order:  Diet Heart Room service appropriate? Yes; Fluid consistency: Thin  EDUCATION NEEDS:   No education needs have been identified at this time  Skin:  Skin Assessment: Reviewed RN  Assessment  Last BM:  06/12/17  Height:   Ht Readings from Last 1 Encounters:  06/13/17 5\' 6"  (1.676 m)    Weight:   Wt Readings from Last 1 Encounters:  06/14/17 147 lb 9.6 oz (67 kg)    Ideal Body Weight:  59.1 kg  BMI:  Body mass index is 23.82 kg/m.  Estimated Nutritional Needs:   Kcal:  2035-5974  Protein:  90-150 grams  Fluid:  1.7-1.9 L    Clee Pandit A. Jimmye Norman, RD, LDN, CDE Pager: [phone] After hours Pager: [phone]

## 2017-06-14 NOTE — Progress Notes (Signed)
  Radiation Oncology         ([phone]) [phone] ________________________________  Name: [name] MRN: [id number]  Date: 06/14/2017  DOB: [birth year]  End of Treatment Note  Diagnosis: Metastatic adenocarcinoma, unknown primary.     Indication for treatment: Palliative       Radiation treatment dates:  05/20/17-06/10/17  Site/dose: Lumbar spine/ 35 Gy in 14 fractions  Beams/energy: 3D/ 15X  Narrative: The patient tolerated radiation treatment relatively well. During treatment the patient complained of shortness of breath at rest and with activity. She notes occasional cough with brown phlegm. She also reports occasional nausea. Mild improvement and low back pain.  Plan: The patient has completed radiation treatment. The patient will return to radiation oncology clinic for routine followup in one month. I advised them to call or return sooner if they have any questions or concerns related to their recovery or treatment.  -----------------------------------  Blair Promise, PhD, MD  This document serves as a record of services personally performed by Gery Pray, MD. It was created on his behalf by Bethann Humble, a trained medical scribe. The creation of this record is based on the scribe's personal observations and the provider's statements to them. This document has been checked and approved by the attending provider.

## 2017-06-14 NOTE — Progress Notes (Signed)
Pt states pain is 10/10. RN notified pharmacy to verify dilaudid.

## 2017-06-14 NOTE — Plan of Care (Signed)
  Safety: Ability to remain free from injury will improve 06/14/2017 0431 - Completed/Met by Evert Kohl, RN   Elimination: Will not experience complications related to bowel motility 06/14/2017 0431 - Completed/Met by Evert Kohl, RN   Coping: Level of anxiety will decrease 06/14/2017 0431 - Completed/Met by Evert Kohl, RN   Nutrition: Adequate nutrition will be maintained 06/14/2017 0431 - Completed/Met by Evert Kohl, RN   Activity: Risk for activity intolerance will decrease 06/14/2017 0431 - Completed/Met by Evert Kohl, RN

## 2017-06-14 NOTE — Evaluation (Signed)
Occupational Therapy Evaluation Patient Details Name: [name] MRN: [id number] DOB: 1955/11/05 Today's Date: 06/14/2017    History of Present Illness [name] is a 62 y.o. female presenting with 2 weeks of worsening congestion, cough, shortness of breath. 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 Pt 95% on RA at beginning of session (O2 on in room at 2 liters, but not on pt). Pt up to bathroom and upon returning from bathroom O2 sats 88% on RA (reapplied O2 at 2 liters). Pt states she feels she can normally take care of herself, but with this 10/10 pain she says she cannot--I texted resident from today's note and asked if Pallative consult for pain management may be appropriate and to please order if so.  Follow Up Recommendations  Home health OT;Supervision - Intermittent    Equipment Recommendations  Tub/shower seat       Precautions / Restrictions Precautions Precautions: None      Mobility Bed Mobility Overal bed mobility: Independent                Transfers Overall transfer level: Needs assistance Equipment used: None Transfers: Sit to/from Stand Sit to Stand: Supervision         General transfer comment: S for all mobility due to managing lines    Balance Overall balance assessment: No apparent balance deficits (not formally assessed)                                         ADL either performed or assessed with clinical judgement   ADL                                         General ADL Comments: overall at a S level.     Vision Patient Visual Report: No change from baseline               Pertinent Vitals/Pain Pain Assessment: 0-10 Pain Score: 10-Worst pain ever Pain Location: chest and abdomen Pain Descriptors / Indicators: Squeezing;Aching;Sore Pain Intervention(s): Limited activity within patient's tolerance;Monitored during session(spoke with RN but not time for IV again and waiting on pill from pharmacy)     Hand Dominance Right   Extremity/Trunk Assessment Upper Extremity Assessment Upper Extremity Assessment: Overall WFL for tasks assessed       Cervical / Trunk Assessment Cervical / Trunk Assessment: Normal   Communication Communication Communication: No difficulties   Cognition Arousal/Alertness: Awake/alert Behavior During Therapy: Anxious Overall Cognitive Status: Within Functional Limits for tasks assessed                                 General Comments: labile due to pain              Home Living Family/patient expects to be discharged to:: Private residence Living Arrangements: Spouse/significant other Available Help at Discharge: Family;Available PRN/intermittently   Home Access: Level entry     Home Layout: One level  Bathroom Shower/Tub: Engineering geologist: None   Additional Comments: significant other works      Prior Functioning/Environment Level of Independence: Independent        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, drives        OT Problem List: Decreased activity tolerance;Cardiopulmonary status limiting activity      OT Treatment/Interventions: Self-care/ADL training;DME and/or AE instruction;Patient/family education;Energy conservation    OT Goals(Current goals can be found in the care plan section) Acute Rehab OT Goals Patient Stated Goal: to get pain better under control OT Goal Formulation: With patient Time For Goal Achievement: 06/28/17 Potential to Achieve Goals: Good  OT Frequency: Min 2X/week   Barriers to D/C: Decreased caregiver  support             AM-PAC PT "6 Clicks" Daily Activity     Outcome Measure Help from another person eating meals?: None Help from another person taking care of personal grooming?: A Little Help from another person toileting, which includes using toliet, bedpan, or urinal?: A Little Help from another person bathing (including washing, rinsing, drying)?: A Little Help from another person to put on and taking off regular upper body clothing?: A Little Help from another person to put on and taking off regular lower body clothing?: A Little 6 Click Score: 19   End of Session Equipment Utilized During Treatment: (none) Nurse Communication: Mobility status;Patient requests pain meds  Activity Tolerance: (pt reports 10/10 pain, but was able to do all I asked of her) Patient left: in bed;with call bell/phone within reach;with bed alarm set  OT Visit Diagnosis: Unsteadiness on feet (R26.81)                Time: 1655-3748 OT Time Calculation (min): 17 min Charges:  OT General Charges $OT Visit: 1 Visit OT Evaluation $OT Eval Moderate Complexity: [address] [place] [phone] 06/14/2017

## 2017-06-14 NOTE — Progress Notes (Signed)
Family Medicine Teaching Service Daily Progress Note Intern Pager: [phone]  Patient name: [name] Medical record number: [id number] Date of birth: Oct 21, 1955 Age: 62 y.o. Gender: female  Primary Care Provider: Esaw Grandchild, NP Consultants: None Code Status: Full  Pt Overview and Major Events to Date:  [name] is a 62 y.o. female presenting with 2 weeks of worsening congestion, cough, shortness of breath. 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 Patient remains hemodynamically stable this am (2/25). Right lower lobe involving segmental and more peripheral branches as seen on CTA. Presumption is that this is most likely a provoked thrombosis due to hypercoagulability from malignancy.  Will likely need 3-6 months of Lovenox versus warfarin with bridge. Per literature review of meta-analysis data, appears LMWH has lower risk of recurrance, and complication as compared with warfarin. PESCI score is 122pts indicating high risk with 4.0-11.4% mortality rate; inpatient management recommended - Telemetry with continuous pulse ox - Lovenox 1mg /kg every 12hr (ordered as lovenox per pharm); will need 3-63m of therapy - Plan for Lovenox as outpatient therapy. - Supplemental oxygen as necessary, keep sats >90%  Possible COPD exacerbation - Has new O2 requirement needing 2L to keep O2 sats above 92%. Patient without formal dx of COPD, however has been treated as COPD exacerbation with inhalers multiple times in the past. VSS and no wheezing but dry cough on exam this am (2/25). Patient reports no SOB or difficulty breathing this morning since receiving steroids. Still has Leukocytosis of 18.9 this am with Procalcitonin (2/25). - monitor pulse ox and  resp - daily CBC - Duo nebs every 6h as needed  Stage IV Right Lung Cancer: Chronic. Pain was not well controlled last night but patient did have relief with Dilaudid. Adenocarcinoma of unknown primary versus NSCLC with systemic bony metastatic disease. S/p 1 mo of radiation therapy (last ttx 2/21). She has also had a Port-A-Cath placed on right chest 2/20 with anticipation of starting palliative chemotherapy on 2/25. Possible abdominal mets seen on CT abdomen on 2/13. No mets seen on brain MR 2/17.  Receives 50-75 mcg of fentanyl via 72-hour patch and 20 oxycodone every 6 hours as needed.  Last fentanyl patch 50 mcg was placed on 2/23.  - Add Dilaudid 1mg  q4 prn - Oxycodone 20mg  q6 scheduled - Increase Fentanyl patch to 75mcg (home dose) - Cont home Dexamthasone 36mg  daily - Contact Dr. Marin Olp (Oncologist) as patient was scheduled to start Chemotherapy today - Bowel regimen Colace and MiraLAX - Wound care per nursing for Port-A-Cath incision  Lower extremity Edema: Acute. Stable.Patient still has +2 pitting edema this am (2/25). Patient has been taking Lasix 20 mg daily for the past 2 weeks as a new medication. BNP was normal at 89.5 - Cont to hold Lasix - Daily BMP  - I's and O's  Systemic weakness, worsening Likely due to deconditioning in the setting of metastatic disease. -PT/OT -Nutrition consult  Chronic pancreatitis s/p biliary stenting: Chronic. Stable Likely due to alcohol abuse given patient's history per the chart. Patient had a biliary stent on 11/30.  Abdominal exam unremarkable this morning (2/25). Recent imaging on 2/13 CT abdominal pelvis did not show any acute findings consistent with pancreatitis. - continue to monitor abdominal exam  Anxiety  Depression, stable. Patient takes Ativan 1 mg 4 times daily.   -  Cont Ativan 1mg  BID and monitor for worsening anxiety  -Monitor respiratory status  Tobacco abuse -Nicotine replacement patches as needed -Nursing to  provide smoking cessation counseling  FEN/GI: Heart Healthy Diet, Nutrition consult PPx: Lovenox for PE Tx  Disposition: inpatient, med-surg  Subjective:  Patient states she did not get a lot of sleep last night due to pain. She did receive dilaudid which helped but it wore off. She is having diffuse pain all over but especially in her back. She also has "crampy" hand pain. Denies SOB, difficulty breathing but is having a cough and has chest pain only when she coughs. Appetite is at her baseline. No nausea or vomiting or abdominal pain.  Objective: Temp:  [98.1 F (36.7 C)-99.1 F (37.3 C)] 98.1 F (36.7 C) (02/25 0427) Pulse Rate:  [89-115] 96 (02/25 0427) Resp:  [13-26] 20 (02/25 0427) BP: (107-147)/(57-87) 132/63 (02/25 0427) SpO2:  [89 %-100 %] 94 % (02/25 0427) Weight:  [147 lb 9.6 oz (67 kg)-155 lb (70.3 kg)] 147 lb 9.6 oz (67 kg) (02/25 0427) Physical Exam: Gen: Alert and Oriented x 3, NAD HEENT: Normocephalic, atraumatic, PERRLA, EOMI CV: RRR, no murmurs, normal S1, S2 split Resp: No increased WOB, decreased breath sounds, no wheezing or crackles Abd: non-distended, non-tender, soft, +bs in all four quadrants; midline well-healed surgical scar MSK: FROM in all four extremities Ext: no clubbing, cyanosis, +2 pedal edema Skin: warm, dry, intact, no rashes, large dark ecchymosis on right hand  Laboratory: Recent Labs  Lab 06/09/17 1242 06/13/17 0944 06/14/17 0617  WBC 17.8* 15.3* 18.9*  HGB 10.1* 10.6* 9.4*  HCT 30.5* 32.8* 29.5*  PLT 95* 98* 92*   Recent Labs  Lab 06/07/17 1339 06/13/17 0944  NA 129* 132*  K 3.8 3.9  CL 96* 95*  CO2 21* 24  BUN 9 12  CREATININE 0.66 0.60  CALCIUM 8.7 8.9  GLUCOSE 164* 129*   2/24 - BNP: 89.5 2/24 - Troponin I-stat: 0.00 2/25 - Procalcitonin: pending 2/25 - PT/INR: pending 2/25 - PTT: pending  Imaging/Diagnostic Tests: 2/24 - CXR:  FINDINGS: No pneumothorax. The heart, hila, and mediastinum are unremarkable. Small  hiatal hernia. The nodularity in the right upper lung on the recent PET-CT is not appreciated on this study.  IMPRESSION: No cause for shortness of breath or chest pain identified. The no nodularity in the right apex is not well assessed with this study.  2/24 - [place] CT Angio: IMPRESSION: 1. Pulmonary emboli in the right lower lobe involving segmental and more peripheral branches. 2. Mild ground-glass in the medial right lung could be infectious. 3. The spiculated nodule in the right upper lobe is not significantly changed accounting for difference in technique. 4. Increasing soft tissue in the right hilum with encasement of the right upper lobe bronchus and right upper lobe pulmonary arterial branches resulting in some narrowing. This is suspicious for disease involvement. 5. Known bony metastatic disease. 6. Atherosclerotic changes in the nonaneurysmal aorta. 7. Moderate hiatal hernia.  Nuala Alpha, DO 06/14/2017, 7:21 AM PGY-1, [place] Intern pager: [phone], text pages welcome

## 2017-06-14 NOTE — Progress Notes (Signed)
Pt is using Kpad to help in pain management.

## 2017-06-14 NOTE — Progress Notes (Signed)
Referral MD  Reason for Referral: Pulmonary embolism; metastatic adenocarcinoma of the lung  Chief Complaint  Patient presents with  . Shortness of Breath  . Chest Pain  : I have a blood clot my long.  HPI: [name] is well-known to me.  She is a 62 year old white female.  She has metastatic adenocarcinoma of the lung.  She had a biopsy done of her back.  This showed adenocarcinoma.  Workup shows a lung primary.  However, the lesion is too small the lung to biopsy safely.  She is scheduled to start chemotherapy today with carboplatin/Alimta/pembrolizumab.  She is not been all that active.  She had a lot of pain.  We will try to manage the pain with a fentanyl patch and short-term pain medication.  Over the weekend, she began to have some shortness of breath.  She had a cough.  She is not coughing of blood.  She then came to the emergency room on Sunday.  A CT angiogram was done.  This showed a pulmonary embolism in the right lower lobe.  It still showed the nodule in the right upper lobe.  She had increasing soft tissue in the right hilum.  She is on Xarelto.  She is doing well on Xarelto.  Her labs today show white cell count of 18.9.  Hemoglobin 9.4.  Platelet count 92,000.  Her calcium is 8.7.  LFTs have not been done.  Potassium 4.1.  She actually feels better.  She is denying any type of shortness of breath.  She is had no bleeding.  There is been no fever.  Overall, her performance status is ECOG 1-2.  So far, a Doppler of her legs has not be done.  She said that she had leg swelling bilaterally over the weekend and then this is gotten better since she started anticoagulation.    Past Medical History:  Diagnosis Date  . Alcohol abuse    H/o withdrawal   . Alcohol withdrawal ([place]) 10/02/2013  . Anxiety   . Cancer [name])    unknown primary. known bone mets.  . Chronic diarrhea    hx of  . Constipation   . Depression   . Elevated liver function tests few yrs ago   . Metastatic lung cancer (metastasis from lung to other site), right ([place]) 06/01/2017  . Pancreatitis   . PONV (postoperative nausea and vomiting)   . Rosacea   . Wears glasses   :  Past Surgical History:  Procedure Laterality Date  . CHOLECYSTECTOMY    . COLONOSCOPY  Never  . DIAGNOSTIC MAMMOGRAM  2008  . ERCP  10/06/2011   Procedure: ENDOSCOPIC RETROGRADE CHOLANGIOPANCREATOGRAPHY (ERCP);  Surgeon: Beryle Beams, MD;  Location: [facility];  Service: Endoscopy;  Laterality: N/A;  . ESOPHAGOGASTRODUODENOSCOPY  03/21/2011   Procedure: ESOPHAGOGASTRODUODENOSCOPY (EGD);  Surgeon: Scarlette Shorts, MD;  Location: [facility] ENDOSCOPY;  Service: Endoscopy;  Laterality: N/A;  Patient may need to be done at bedside tomorrow depending on status  . ESOPHAGOGASTRODUODENOSCOPY  01/06/2012   Procedure: ESOPHAGOGASTRODUODENOSCOPY (EGD);  Surgeon: Wonda Horner, MD;  Location: [facility];  Service: Endoscopy;  Laterality: N/A;  bedside  . HERNIA REPAIR    . INSERTION OF MESH  07/09/2016   Procedure: INSERTION OF MESH;  Surgeon: Michael Boston, MD;  Location: [facility];  Service: General;;  . IR FLUORO GUIDE PORT INSERTION RIGHT  06/09/2017  . IR US GUIDE VASC ACCESS RIGHT  06/09/2017  . LAPAROTOMY  01/03/2012   Procedure: EXPLORATORY  LAPAROTOMY;  Surgeon: Pedro Earls, MD;  Location: [facility];  Service: General;  Laterality: N/A;  debridement of necrotic pancreas and placement of drains x2  . pancreatic cyst removed   2015  . SHOULDER SURGERY Left 02/18/2010   clavical repair  . TUBAL LIGATION    . VENTRAL HERNIA REPAIR N/A 07/09/2016   Procedure: LAPAROSCOPIC LYSIS OF ADHESIONS  VENTRAL WALL HERNIA REPAIR WITH MESH EXCISION OF HERNIA [place];  Surgeon: Michael Boston, MD;  Location: [facility];  Service: General;  Laterality: N/A;  :   Current Facility-Administered Medications:  .  albuterol (PROVENTIL) (2.5 MG/3ML) 0.083% nebulizer solution 2.5 mg, 2.5 mg, Nebulization, Q4H PRN, Chambliss, Jeb Levering, MD .   calcium-vitamin D (OSCAL WITH D) 500-200 MG-UNIT per tablet, , Oral, Daily, Bonnita Hollow, MD, 1 tablet at 06/14/17 [id number] .  dexamethasone (DECADRON) tablet 36 mg, 36 mg, Oral, Daily, Bonnita Hollow, MD, 36 mg at 06/14/17 1239 .  docusate sodium (COLACE) capsule 250 mg, 250 mg, Oral, BID PRN, Bonnita Hollow, MD .  enoxaparin (LOVENOX) injection 70 mg, 70 mg, Subcutaneous, Q12H, Kris Mouton, RPH, 70 mg at 06/14/17 4656 .  feeding supplement (ENSURE ENLIVE) (ENSURE ENLIVE) liquid 237 mL, 237 mL, Oral, BID BM, Chambliss, Marshall L, MD .  fentaNYL (DURAGESIC - dosed mcg/hr) patch 75 mcg, 75 mcg, Transdermal, Q72H, Lockamy, Timothy, DO, 75 mcg at 06/14/17 0943 .  furosemide (LASIX) tablet 20 mg, 20 mg, Oral, Daily, Lockamy, Timothy, DO, 20 mg at 06/14/17 1221 .  HYDROmorphone (DILAUDID) injection 1 mg, 1 mg, Intravenous, Q4H PRN, Bonnita Hollow, MD, 1 mg at 06/14/17 1348 .  ipratropium-albuterol (DUONEB) 0.5-2.5 (3) MG/3ML nebulizer solution 3 mL, 3 mL, Nebulization, BID, Chambliss, Marshall L, MD .  LORazepam (ATIVAN) tablet 1 mg, 1 mg, Oral, BID PRN, Bonnita Hollow, MD, 1 mg at 06/14/17 0529 .  nicotine (NICODERM CQ - dosed in mg/24 hours) patch 14 mg, 14 mg, Transdermal, Daily PRN, Bonnita Hollow, MD .  ondansetron [facility]) tablet 4 mg, 4 mg, Oral, Q8H PRN, Bonnita Hollow, MD .  oxyCODONE (Oxy IR/ROXICODONE) immediate release tablet 20 mg, 20 mg, Oral, Q8H, Bonnita Hollow, MD, 20 mg at 06/14/17 1445 .  polyethylene glycol (MIRALAX / GLYCOLAX) packet 17 g, 17 g, Oral, Daily PRN, Bonnita Hollow, MD:  . calcium-vitamin D   Oral Daily  . dexamethasone  36 mg Oral Daily  . enoxaparin (LOVENOX) injection  70 mg Subcutaneous Q12H  . feeding supplement (ENSURE ENLIVE)  237 mL Oral BID BM  . fentaNYL  75 mcg Transdermal Q72H  . furosemide  20 mg Oral Daily  . ipratropium-albuterol  3 mL Nebulization BID  . oxyCODONE  20 mg Oral Q8H  :  Allergies  Allergen Reactions  .  Ciprofloxacin Nausea Only  :  Family History  Problem Relation Age of Onset  . Stroke Mother   . Heart disease Father   . Heart failure Father   . Heart disease Sister   :  Social History   Socioeconomic History  . Marital status: Divorced    Spouse name: Not on file  . Number of children: Not on file  . Years of education: Not on file  . Highest education level: Not on file  Social Needs  . Financial resource strain: Not on file  . Food insecurity - worry: Not on file  . Food insecurity - inability: Not on file  . Transportation needs - medical:  Not on file  . Transportation needs - non-medical: Not on file  Occupational History  . Not on file  Tobacco Use  . Smoking status: Heavy Tobacco Smoker    Packs/day: 1.00    Years: 15.00    Pack years: 15.00    Types: Cigarettes  . Smokeless tobacco: Never Used  . Tobacco comment: smoking a pack per day now  Substance and Sexual Activity  . Alcohol use: Yes    Alcohol/week: 1.5 - 2.0 oz    Types: 3 - 4 Standard drinks or equivalent per week    Comment: quit 2016 - 2 drinks per week   . Drug use: Yes    Types: Marijuana    Comment: occasional  . 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  Other Topics Concern  . Not on file  Social History Narrative   Has a son and some friends with whom she is close. Was in rehab in 09/2010 and was sober for a few months after.   :  Pertinent items are noted in HPI.  Exam: Patient Vitals for the past 24 hrs:  BP Temp Temp src Pulse Resp SpO2 Weight  06/14/17 0831 - - - - - 96 % -  06/14/17 0427 132/63 98.1 F (36.7 C) Oral 96 20 94 % 147 lb 9.6 oz (67 kg)  06/14/17 0223 - - - - - 95 % -  06/14/17 0036 (!) 144/64 98.6 F (37 C) Oral 89 20 95 % -  06/13/17 1939 129/61 98.6 F (37 C) Oral 97 20 97 % -     Recent Labs    06/13/17 0944 06/14/17 0617  WBC 15.3* 18.9*  HGB 10.6* 9.4*  HCT 32.8* 29.5*  PLT 98* 92*   Recent Labs     06/13/17 0944 06/14/17 0617  NA 132* 130*  K 3.9 4.1  CL 95* 95*  CO2 24 24  GLUCOSE 129* 97  BUN 12 10  CREATININE 0.60 0.49  CALCIUM 8.9 8.7*    Blood smear review: None  Pathology: None    Assessment and Plan: Ms. Moret is a 62 year old white female.  She has metastatic adenocarcinoma of the lung.  I am sure this is her risk factor for the pulmonary embolism.  I am sure that she is hypercoagulable from her malignancy.  I really think that a Doppler of her legs need to be done.  I need to see if she had thrombotic disease in her legs.  Thankfully, recent clinical trials did show that the new oral anticoagulants are effective in patients with malignancy and thrombotic complications.  I suspect that she will be on lifelong anticoagulation as long as she has the metastatic lung cancer.  I think that we need to check iron studies on her.  Her hemoglobin is down a little bit.  I suspect that she probably has an element of iron deficiency.  Her platelet count is down a little bit.  This might reflect marrow involvement by malignancy.  I do not think that her thrombocytopenia will affect her being on Xarelto.  I spoke to she and her husband today.  I know them well.  We will follow along and help out in any way that we can.  [name] Haw, MD  Psalms 51:4

## 2017-06-14 NOTE — Progress Notes (Addendum)
Benefit check for NOAC in progressAneta Mins [phone]   RE: Benefit Check  Received: Today  Message Contents  Lauris Chroman, RN        # 2. Jamelle Haring  JEANETTE @ [facility] # [phone]    1. ELIQUIS 5 MG BID  COVER- YES  CO-PAY- ZERO DOLLARS  PRIOR APPROVAL- NO   2. ELIQUIS 2.5 MG BID  COVER- YES  CO-PAY- ZERO DOLLARS  PRIOR APPROVAL- NO   3. XARELTO 15 MG BID  COVER- YES  CO-PAY- ZERO DOLLARS  PRIOR APPROVAL- NO   4. XARELTO 20 MG DAILY  COVER- YES  CO-PAY- ZERO DOLLARS  PRIOR APPROVAL- NO    PREFERRED PHARMACY : [facility] AND [facility]

## 2017-06-14 NOTE — Progress Notes (Signed)
Palliative Medicine consult noted. Due to high referral volume, there may be a delay seeing this patient. Please call the Palliative Medicine Team office at [phone] if recommendations are needed in the interim.  Thank you for inviting [place] to see this patient.  Marjie Skiff Kavina Cantave, RN, BSN, [facility] Palliative Medicine Team 06/14/2017 12:44 PM Office [phone]

## 2017-06-14 NOTE — Progress Notes (Signed)
LE venous duplex prelim: DVT noted in the Right peroneal and soleal veins and in the Left gastroc, posterior tibial, peroneal, and soleal veins. Landry Mellow, RDMS, RVT Gave results to Judson Roch, RN

## 2017-06-14 NOTE — Discharge Summary (Signed)
[facility] Discharge Summary  Patient name: [name] Medical record number: [id number] Date of birth: 08/14/1955 Age: 62 y.o. 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 Anemia of Chronic Disease Herpetic Rash  Disposition: home  Discharge Condition: medically stable  Discharge Exam:   Gen: Alert and Oriented x 3, NAD HEENT: Normocephalic, atraumatic, PERRLA, EOMI CV: RRR, no murmurs, normal S1, S2 split,  Resp: CTAB, no wheezing, rales, or rhonchi, comfortable work of breathing Abd: non-distended, non-tender, soft, +bs in all four quadrants MSK: FROM in all four extremities Ext: no clubbing, cyanosis, or edema Skin: warm, dry, intact, no rashes  Brief Hospital Course:  [name] is a 61y/o female with a PMH of stage IV lung cancer with metastasis to the bone (spine and sacrum) s/p radiation therapy, Hx of Herpes, Anemia of chronic disease. She presented with SOB, cough, and fatigue on exertion. She also had chest pain on deep inspiration and with a cough. A Chest CT Angiogram was performed and found a right lower lobe PE involving the segmental and more peripheral branches. She was also found to have bilateral DVTs on LE Doppler U/S.   She was started on Lovenox at 1mg /kg (70mg  daily) dosing on 2/25 and converted to oral on 2/26 at 10mg  daily.   She required 2L of O2 [place] while in the hospital and had desaturation of 86% while ambulating on RA but maintained her O2  saturation to 92% while on O2.   She received 2U of PRBCs while in the hospital to improve her Hgb from 8.6 to 11.7  She was started on mirtazipine 7.5mg  daily to help with anxiety, sleep, and nausea.  She was started on Ramelteon 8mg  nightly for sleep.  She had adequate pain control with a 3mcg Fentanyl patch every 72hrs, Oxycodone extended release 20mg  every 12 hours, and Hydromorphone 2mg  every 4 hours as needed. Her MME is 154.  She was originally tachycardic and tachypnec on admission but her vital signs stabilized and remained stable while in the hospital. On 2/28 she was medically stable to be discharged home so she could begin her outpatient chemotherapy treatment.  Issues for Follow Up:  1. Please follow up with your Oncologist Dr. Marcene Duos to start Chemotherapy for your lung cancer and for further help in managing your pain. 2. You were started on Eliquis for your Pulmonary Embolism. Please take 2 tablets (10mg ) for the next 4 days starting on 3/1 and stopping on 3/4. On 3/5 please start taking only 1 tablet (5mg ) daily. Please continue taking Eliquis for 3 months. 3. Please follow up with me in the clinic to ensure you are taking your medications as prescribed.  Significant Procedures:   CTA Chest  Significant Labs and Imaging:  Recent Labs  Lab 06/15/17 0450 06/16/17 0442 06/16/17 1230 06/17/17 0411  WBC 12.6* 13.5*  --  14.6*  HGB 9.1* 8.6* 9.3* 11.7*  HCT 28.9* 27.0* 28.9* 35.8*  PLT 89* 86*  --  81*   Recent Labs  Lab 06/13/17 0944 06/14/17 0617 06/15/17 0450 06/16/17 0442  NA 132* 130* 131* 132*  K 3.9 4.1 4.3 4.4  CL 95* 95* 95* 97*  CO2 24 24 25 25   GLUCOSE 129* 97 157* 122*  BUN 12 10 12 12   CREATININE 0.60 0.49 0.47 0.54  CALCIUM 8.9 8.7* 8.5* 8.6*   2/24 - BNP: 89.5 2/24 - Troponin I-stat: 0.00 2/25 - Procalcitonin: 0.24 2/25 - PT/INR: 13.3/1.02 2/25 - PTT: 38 2/26 - Iron & TIBC: Iron 23, TIBC 235, Sat Ratio 10% (all low) 2/26 - Ferritin: 296  (nl)  Imaging/Diagnostic Tests: 2/24 - CXR:  FINDINGS: No pneumothorax. The heart, hila, and mediastinum are unremarkable. Small hiatal hernia. The nodularity in the right upper lung on the recent PET-CT is not appreciated on this study.  IMPRESSION: No cause for shortness of breath or chest pain identified. The no nodularity in the right apex is not well assessed with this study.  2/24 - [place] CT Angio: IMPRESSION: 1. Pulmonary emboli in the right lower lobe involving segmental and more peripheral branches. 2. Mild ground-glass in the medial right lung could be infectious. 3. The spiculated nodule in the right upper lobe is not significantly changed accounting for difference in technique. 4. Increasing soft tissue in the right hilum with encasement of the right upper lobe bronchus and right upper lobe pulmonary arterial branches resulting in some narrowing. This is suspicious for disease involvement. 5. Known bony metastatic disease. 6. Atherosclerotic changes in the nonaneurysmal aorta. 7. Moderate hiatal hernia.  Results/Tests Pending at Time of Discharge:   None  Discharge Medications:  Allergies as of 06/17/2017      Reactions   Ciprofloxacin Nausea Only      Medication List    STOP taking these medications   amoxicillin-clavulanate 875-125 MG tablet Commonly known as:  AUGMENTIN   Oxycodone HCl 20 MG Tabs Replaced by:  oxyCODONE 20 mg 12 hr tablet     TAKE these medications   apixaban 5 MG Tabs tablet Commonly known as:  ELIQUIS Take 2 tablets (10 mg total) by mouth daily. After taking 2 tablets (10mg ) for 4 days   apixaban 5 MG Tabs tablet Commonly known as:  ELIQUIS Take 1 tablet (5 mg total) by mouth daily. Start after finishing taking 2 tablets per day. Start taking on:  06/21/2017   CALCIUM 600+D PO Take 1 tablet by mouth daily.   dexamethasone 6 MG tablet Commonly known as:  DECADRON Take 3 tablets (18 mg total) by mouth daily. What changed:     how much to take  how to take this  when to take this  additional instructions   diphenoxylate-atropine 2.5-0.025 MG tablet Commonly known as:  LOMOTIL Take 2 tablets by mouth 4 (four) times daily as needed for diarrhea or loose stools.   docusate sodium 250 MG capsule Commonly known as:  COLACE Take 1 capsule (250 mg total) by mouth daily. While taking pain medication   EQ NICOTINE 14 mg/24hr patch Generic drug:  nicotine Place 14 mg onto the skin daily.   feeding supplement (ENSURE ENLIVE) Liqd Take 237 mLs by mouth 2 (two) times daily between meals.   fentaNYL 75 MCG/HR Commonly known as:  DURAGESIC - dosed mcg/hr Place 1 patch (75 mcg total) onto the skin every 3 (three) days. Start taking on:  06/20/2017 What changed:    how much to take  how to take this  when to take this  additional instructions   furosemide 20 MG tablet Commonly known as:  LASIX Take 1 tablet (20 mg total) by mouth  daily.   gabapentin 600 MG tablet Commonly known as:  NEURONTIN Take 0.5 tablets (300 mg total) by mouth 2 (two) times daily.   HYDROmorphone 2 MG tablet Commonly known as:  DILAUDID Take 1 tablet (2 mg total) by mouth every 4 (four) hours as needed (breakthrough pain). Notes to patient:  Last taken 06/17/17 1:11pm   ibuprofen 200 MG tablet Commonly known as:  ADVIL,MOTRIN Take 200 mg by mouth every 6 (six) hours as needed for moderate pain.   ipratropium-albuterol 0.5-2.5 (3) MG/3ML Soln Commonly known as:  DUONEB Take 3 mLs by nebulization every 6 (six) hours as needed.   LORazepam 1 MG tablet Commonly known as:  ATIVAN Take 1 tablet (1 mg total) by mouth 3 (three) times daily as needed. for anxiety What changed:    when to take this  additional instructions Notes to patient:  Last dose taken 06/17/17 0811   mirtazapine 7.5 MG tablet Commonly known as:  REMERON Take 1 tablet (7.5 mg total) by mouth at bedtime. Notes to patient:  06/17/17 10pm   oxyCODONE  20 mg 12 hr tablet Commonly known as:  OXYCONTIN Take 1 tablet (20 mg total) by mouth every 12 (twelve) hours. Replaces:  Oxycodone HCl 20 MG Tabs   pantoprazole 40 MG tablet Commonly known as:  PROTONIX Take 1 tablet (40 mg total) by mouth daily. Start taking on:  06/18/2017   prochlorperazine 10 MG tablet Commonly known as:  COMPAZINE Take 1 tablet (10 mg total) by mouth every 6 (six) hours as needed for nausea or vomiting.   ramelteon 8 MG tablet Commonly known as:  ROZEREM Take 1 tablet (8 mg total) by mouth at bedtime.   valACYclovir 1000 MG tablet Commonly known as:  VALTREX Take 1 tablet (1,000 mg total) by mouth daily as needed (fever blisters).            Durable Medical Equipment  (From admission, onward)        Start     Ordered   06/17/17 1351  For home use only DME Nebulizer/meds  Once    Question:  Patient needs a nebulizer to treat with the following condition  Answer:  Lung cancer ([place])   06/17/17 1351   06/17/17 1301  For home use only DME Gilford Rile  [facility])  Once    Question:  Patient needs a walker to treat with the following condition  Answer:  Metastatic adenocarcinoma ([place])   06/17/17 1300   06/17/17 1300  DME Oxygen  Once    Question Answer Comment  Mode or (Route) Nasal cannula   Frequency Continuous (stationary and portable oxygen unit needed)   Oxygen conserving device Yes   Oxygen delivery system Gas      06/17/17 1300   06/17/17 1300  DME tub bench  Once     06/17/17 1300   06/15/17 2157  For home use only DME Tub bench  Once     06/15/17 2156   06/15/17 2157  For home use only DME Shower stool  Once     06/15/17 2156   06/15/17 1357  For home use only DME oxygen  Once    Question Answer Comment  Mode or (Route) Nasal cannula   Liters per Minute 2   Frequency Continuous (stationary and portable oxygen unit needed)   Oxygen delivery system Gas      06/15/17 1356      Discharge Instructions: Please refer to Patient Instructions  section of EMR for  full details.  Patient was counseled important signs and symptoms that should prompt return to medical care, changes in medications, dietary instructions, activity restrictions, and follow up appointments.   Follow-Up Appointments:   Nuala Alpha, DO 06/14/2017, 10:40 AM PGY-1, [place]

## 2017-06-14 NOTE — Progress Notes (Signed)
Vascular states pt has DVT's in bilateral legs. MD notified.

## 2017-06-14 NOTE — Progress Notes (Signed)
PT Cancellation Note  Patient Details Name: [name] MRN: [id number] DOB: 04-Apr-1956   Cancelled Treatment:    Reason Eval/Treat Not Completed: Pain limiting ability to participate. Pt had already worked with OT and continues to be in 10/10 pain. She did not feel that she could get up at this time. Has been driving and working from home until admission. Discussed minimal activity to maintain strength and pt voiced understanding. RN also set up K pad to help pt's pain. Will check back at another time.    [place] 06/14/2017, 12:31 PM

## 2017-06-15 ENCOUNTER — Encounter (HOSPITAL_COMMUNITY): Payer: Self-pay | Admitting: Family Medicine

## 2017-06-15 DIAGNOSIS — G893 Neoplasm related pain (acute) (chronic): Secondary | ICD-10-CM

## 2017-06-15 DIAGNOSIS — Z515 Encounter for palliative care: Secondary | ICD-10-CM

## 2017-06-15 LAB — CBC
HCT: 28.9 % — ABNORMAL LOW (ref 36.0–46.0)
Hemoglobin: 9.1 g/dL — ABNORMAL LOW (ref 12.0–15.0)
MCH: 27.2 pg (ref 26.0–34.0)
MCHC: 31.5 g/dL (ref 30.0–36.0)
MCV: 86.5 fL (ref 78.0–100.0)
PLATELETS: 89 10*3/uL — AB (ref 150–400)
RBC: 3.34 MIL/uL — AB (ref 3.87–5.11)
RDW: 16.1 % — ABNORMAL HIGH (ref 11.5–15.5)
WBC: 12.6 10*3/uL — ABNORMAL HIGH (ref 4.0–10.5)

## 2017-06-15 LAB — BASIC METABOLIC PANEL
Anion gap: 11 (ref 5–15)
BUN: 12 mg/dL (ref 6–20)
CO2: 25 mmol/L (ref 22–32)
Calcium: 8.5 mg/dL — ABNORMAL LOW (ref 8.9–10.3)
Chloride: 95 mmol/L — ABNORMAL LOW (ref 101–111)
Creatinine, Ser: 0.47 mg/dL (ref 0.44–1.00)
GFR calc Af Amer: >60 mL/min (ref >60)
GLUCOSE: 157 mg/dL — AB (ref 65–99)
POTASSIUM: 4.3 mmol/L (ref 3.5–5.1)
Sodium: 131 mmol/L — ABNORMAL LOW (ref 135–145)

## 2017-06-15 LAB — PROCALCITONIN: Procalcitonin: 0.21 ng/mL

## 2017-06-15 MED ORDER — APIXABAN 5 MG PO TABS: 5.0000 mg | ORAL_TABLET | Freq: Two times a day (BID) | ORAL | Status: DC

## 2017-06-15 MED ORDER — HYDROMORPHONE HCL 1 MG/ML IJ SOLN: 1.0000 mg | INTRAMUSCULAR | Status: DC | PRN

## 2017-06-15 MED ORDER — ACETAMINOPHEN 325 MG PO TABS: 650.0000 mg | ORAL_TABLET | Freq: Three times a day (TID) | ORAL | Status: DC

## 2017-06-15 MED ORDER — OXYCODONE HCL 5 MG PO TABS
20.0000 mg | ORAL_TABLET | Freq: Four times a day (QID) | ORAL | Status: DC
Start: 2017-06-15 — End: 2017-06-15
  Administered 2017-06-15: 20 mg via ORAL
  Filled 2017-06-15: qty 4

## 2017-06-15 MED ORDER — APIXABAN 5 MG PO TABS
10.0000 mg | ORAL_TABLET | Freq: Two times a day (BID) | ORAL | Status: DC
  Administered 2017-06-15 – 2017-06-17 (×4): 10 mg via ORAL
  Filled 2017-06-15 (×5): qty 2

## 2017-06-15 MED ORDER — SODIUM CHLORIDE 0.9 % IV SOLN
750.0000 mg | Freq: Once | INTRAVENOUS | Status: AC
  Administered 2017-06-15: 750 mg via INTRAVENOUS
  Filled 2017-06-15: qty 15

## 2017-06-15 MED ORDER — DEXAMETHASONE 6 MG PO TABS
18.0000 mg | ORAL_TABLET | Freq: Every day | ORAL | Status: DC
  Administered 2017-06-16 – 2017-06-17 (×2): 18 mg via ORAL
  Filled 2017-06-15 (×3): qty 3

## 2017-06-15 MED ORDER — ORAL CARE MOUTH RINSE
15.0000 mL | Freq: Two times a day (BID) | OROMUCOSAL | Status: DC
  Administered 2017-06-15 – 2017-06-16 (×3): 15 mL via OROMUCOSAL

## 2017-06-15 MED ORDER — OXYCODONE HCL ER 10 MG PO T12A
20.0000 mg | EXTENDED_RELEASE_TABLET | Freq: Two times a day (BID) | ORAL | Status: DC
  Administered 2017-06-15 – 2017-06-16 (×4): 20 mg via ORAL
  Filled 2017-06-15 (×4): qty 2

## 2017-06-15 MED ORDER — RAMELTEON 8 MG PO TABS
8.0000 mg | ORAL_TABLET | Freq: Every day | ORAL | Status: DC
  Administered 2017-06-15 – 2017-06-16 (×3): 8 mg via ORAL
  Filled 2017-06-15 (×4): qty 1

## 2017-06-15 MED ORDER — HYDROMORPHONE HCL 2 MG PO TABS: 2.0000 mg | ORAL_TABLET | ORAL | Status: DC | PRN

## 2017-06-15 MED ORDER — HYDROMORPHONE HCL 2 MG PO TABS
2.0000 mg | ORAL_TABLET | ORAL | Status: DC | PRN
  Administered 2017-06-15: 2 mg via ORAL
  Filled 2017-06-15: qty 1

## 2017-06-15 MED ORDER — IBUPROFEN 200 MG PO TABS: 400.0000 mg | ORAL_TABLET | Freq: Two times a day (BID) | ORAL | Status: DC

## 2017-06-15 MED ORDER — HYDROMORPHONE HCL 2 MG PO TABS
2.0000 mg | ORAL_TABLET | ORAL | Status: DC | PRN
  Administered 2017-06-15 – 2017-06-17 (×9): 2 mg via ORAL
  Filled 2017-06-15 (×9): qty 1

## 2017-06-15 NOTE — Plan of Care (Signed)
  Progressing Education: Knowledge of General Education information will improve 06/15/2017 1124 - Progressing by Ottie Glazier, RN Clinical Measurements: Ability to maintain clinical measurements within normal limits will improve 06/15/2017 1124 - Progressing by Ottie Glazier, RN Pain Managment: General experience of comfort will improve 06/15/2017 1124 - Progressing by Ottie Glazier, RN

## 2017-06-15 NOTE — Consult Note (Signed)
Consultation Note Date: 06/15/2017   Patient Name: [name]  DOB: 1955-08-08  MRN: [id number]  Age / Sex: 62 y.o., female  PCP: Esaw Grandchild, NP Referring Physician: Lind Covert, MD  Reason for Consultation: Establishing goals of care, Pain control and Psychosocial/spiritual support  HPI/Patient Profile: 63 y.o. female   06/13/2017 admitted on 06-13-17   2 weeks of worsening congestion, cough, shortness of breath. PMH is significant for Stage IV adenocarcinoma of unknown primary versus NSCLC, bony metastasis throughout spine,ribs, sacrum, tobacco abuse, chronic pancreatitis s/p biliary stenting, MDD, anxiety, alcohol abuse,  COPD.  Her oncologist is Dr. Marin Olp.  She is status post radiation therapy/pelvis under Dr. Sondra Come.  Her cancer pain has been the biggest impact on her life at this time.  Patient was admitted for further assessment, treatment and stabilization.  VSS on 2/26 positive  for right lower lobe PE/bilateral DVT and currently under treatment, will need ongoing anticoagulation.  Per Dr. Marin Olp once patient is stable and discharged home he hopes to see her in the outpatient office to initiate systemic chemo immunotherapy.  Patient understands her cancer is not curable but remains hopeful for prolonged quality of life and improved performance status..  Patient faces treatment option decisions, advanced directive decisions and anticipatory care needs.     Clinical Assessment and Goals of Care:  This NP Wadie Lessen reviewed medical records, received report from team, assessed the patient and then meet at the patient's bedside  to discuss diagnosis,  GOC, disposition and options and specifically pain management  Concept of  Palliative Care was discussed.  Values and goals of care important to patient and family were attempted to be elicited.  A detailed discussion was had  today regarding pain management strategies.  Reports much improved pain control since completing her radiation and we discussed at it is likely she will continue to have further improvement as radiation continues to impact disease process several weeks into the future.  We discussed that it is unlikely that she will be pain-free but that the goal is that we reach a level of pain management that is acceptable and allows for increased function and quality.  Hope is that her pain medication needs will decrease as disease is better controlled through both radiation and chemotherapy.  We discussed strategy of maximizing one medication before adding another.  Currently she is on 3 different opioids, both oral and IV (see recommendations)  Questions and concerns addressed.   Family encouraged to call with questions or concerns.    PMT will continue to support holistically.   SUMMARY OF RECOMMENDATIONS    Code Status/Advance Care Planning:  Full code   Symptom Management:   Pain:  Pain has been the main issue for [name], prior to radiation it was dibilitating.  Radaition has helped significantly, however her pain control is not at a acceptable level at this time.   Today we will make minimal changes:  -DC oxycodone IR 20 mg every 6 hours -Initiate oxycodone ER 20 mg  every 12 hours -DC Dilaudid 1 mg IV every 4 hours as needed -Initiate Dilaudid 2 mg p.o. every 4 hours as needed for breakthrough pain -Continue fentanyl patch 75 mcg -Continue Decadron 18 mg p.o. daily, this is a reduced dose from admission -Continue Ativan 1 minute p.o. twice daily as needed  Ultimately would like to minimize all pain management down to oxycodone eliminating need for fentanyl patch.  Recommended augmenting above with Tylenol 650 mg every 6 hours for its role in bone pain but patient refused telling me that it makes her nauseous.  Discussed with Dr. Marin Olp above changes.  Spoke with  Dr. Marin Olp in  these treatment plan changes as that he will be the prescribing physician on discharge as he follows her for her oncologic needs.    Palliative Prophylaxis:   Bowel Regimen and Frequent Pain Assessment  Additional Recommendations (Limitations, Scope, Preferences):  Full Scope Treatment  Psycho-social/Spiritual:   Emotional support offered.  Offered space and opportunity for patient to share her thoughts and feelings regarding her current medical situation.  She speaks to this being "whirlwind".  Only a few months ago she was living "a normal" life and now "I do not even know what to do"  Palliative medicine team will continue to support holistically   Prognosis:   Unable to determine  Discharge Planning: To Be Determined      Primary Diagnoses: Present on Admission: . Pulmonary embolism ([place])   I have reviewed the medical record, interviewed the patient and family, and examined the patient. The following aspects are pertinent.  Past Medical History:  Diagnosis Date  . Alcohol abuse    H/o withdrawal   . Alcohol withdrawal ([place]) 10/02/2013  . Anxiety   . Cancer [facility])    unknown primary. known bone mets.  . Chronic diarrhea    hx of  . Constipation   . Depression   . Elevated liver function tests few yrs ago  . Metastatic lung cancer (metastasis from lung to other site), right ([place]) 06/01/2017  . Pancreatitis   . PONV (postoperative nausea and vomiting)   . Rosacea   . Wears glasses    Social History   Socioeconomic History  . Marital status: Divorced    Spouse name: None  . Number of children: None  . Years of education: None  . Highest education level: None  Social Needs  . Financial resource strain: None  . Food insecurity - worry: None  . Food insecurity - inability: None  . Transportation needs - medical: None  . Transportation needs - non-medical: None  Occupational History  . None  Tobacco Use  . Smoking status: Heavy Tobacco Smoker     Packs/day: 1.00    Years: 15.00    Pack years: 15.00    Types: Cigarettes  . Smokeless tobacco: Never Used  . Tobacco comment: smoking a pack per day now  Substance and Sexual Activity  . Alcohol use: Yes    Alcohol/week: 1.5 - 2.0 oz    Types: 3 - 4 Standard drinks or equivalent per week    Comment: quit 2016 - 2 drinks per week   . Drug use: Yes    Types: Marijuana    Comment: occasional  . 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  Other Topics Concern  . None  Social History Narrative   Has a son and some friends with whom she is close. Was in  rehab in 09/2010 and was sober for a few months after.    Family History  Problem Relation Age of Onset  . Stroke Mother   . Heart disease Father   . Heart failure Father   . Heart disease Sister    Scheduled Meds: . acetaminophen  650 mg Oral Q8H  . apixaban  10 mg Oral BID  . calcium-vitamin D   Oral Daily  . [START ON 06/16/2017] dexamethasone  18 mg Oral Daily  . feeding supplement (ENSURE ENLIVE)  237 mL Oral BID BM  . fentaNYL  75 mcg Transdermal Q72H  . furosemide  20 mg Oral Daily  . ipratropium-albuterol  3 mL Nebulization BID  . mouth rinse  15 mL Mouth Rinse BID  . oxyCODONE  20 mg Oral Q12H  . ramelteon  8 mg Oral QHS   Continuous Infusions: PRN Meds:.albuterol, docusate sodium, HYDROmorphone, LORazepam, nicotine, ondansetron, polyethylene glycol Medications Prior to Admission:  Prior to Admission medications   Medication Sig Start Date End Date Taking? Authorizing Provider  Calcium Carbonate-Vitamin D (CALCIUM 600+D PO) Take 1 tablet by mouth daily.   Yes [provider]  dexamethasone (DECADRON) 6 MG tablet Take 5 pills today with food, then 3 pills a day with food. Patient taking differently: Take 6 mg by mouth daily.  05/10/17  Yes Volanda Napoleon, MD  diphenoxylate-atropine (LOMOTIL) 2.5-0.025 MG tablet Take 2 tablets by mouth 4 (four) times daily as needed for  diarrhea or loose stools. 06/07/17  Yes Gery Pray, MD  EQ NICOTINE 14 MG/24HR patch Place 14 mg onto the skin daily.  03/31/17  Yes [provider]  fentaNYL (DURAGESIC - DOSED MCG/HR) 75 MCG/HR APPLY 1 PATCH TOPICALLY EVERY 72 HOURS 06/07/17  Yes Gery Pray, MD  furosemide (LASIX) 20 MG tablet Take 1 tablet (20 mg total) by mouth daily. 06/07/17  Yes Gery Pray, MD  ibuprofen (ADVIL,MOTRIN) 200 MG tablet Take 200 mg by mouth every 6 (six) hours as needed for moderate pain.   Yes [provider]  LORazepam (ATIVAN) 1 MG tablet Take 1 tablet (1 mg total) by mouth 3 (three) times daily as needed. for anxiety Patient taking differently: Take 1 mg by mouth 4 (four) times daily. for anxiety 06/03/17  Yes Ennever, Rudell Cobb, MD  oxyCODONE 20 MG TABS Take 1 tablet (20 mg total) by mouth every 6 (six) hours as needed for severe pain. Patient taking differently: Take 20 mg by mouth every 6 (six) hours.  06/08/17  Yes Ennever, Rudell Cobb, MD  valACYclovir (VALTREX) 1000 MG tablet Take 1 tablet (1,000 mg total) by mouth daily as needed (fever blisters). 06/04/17  Yes Bruning, Ashlyn, PA-C  amoxicillin-clavulanate (AUGMENTIN) 875-125 MG tablet Take 1 tablet by mouth 2 (two) times daily. Patient not taking: Reported on 06/13/2017 06/02/17   Hayden Pedro, PA-C  docusate sodium (COLACE) 250 MG capsule Take 1 capsule (250 mg total) by mouth daily. While taking pain medication Patient not taking: Reported on 06/13/2017 03/30/17   Duffy Bruce, MD  prochlorperazine (COMPAZINE) 10 MG tablet Take 1 tablet (10 mg total) by mouth every 6 (six) hours as needed for nausea or vomiting. Patient not taking: Reported on 06/13/2017 05/13/17   Gery Pray, MD   Allergies  Allergen Reactions  . Ciprofloxacin Nausea Only   Review of Systems  Constitutional: Positive for fatigue.  Neurological: Positive for weakness.    Physical Exam  Constitutional: She is oriented to person, place, and time.  She  appears well-developed.  Cardiovascular: Normal rate and regular rhythm.  Pulmonary/Chest: Breath sounds normal.  Musculoskeletal:  Generalized weakness  Neurological: She is alert and oriented to person, place, and time.  Skin: Skin is warm and dry.    Vital Signs: BP 122/64 (BP Location: Left Arm)   Pulse 84   Temp 97.7 F (36.5 C) (Oral)   Resp 18   Ht 5\' 6"  (1.676 m)   Wt 65.2 kg (143 lb 11.2 oz) Comment: a scale  LMP 12/08/2006   SpO2 94%   BMI 23.19 kg/m  Pain Assessment: 0-10   Pain Score: 3    SpO2: SpO2: 94 % O2 Device:SpO2: 94 % O2 Flow Rate: .O2 Flow Rate (L/min): 2 L/min  IO: Intake/output summary:   Intake/Output Summary (Last 24 hours) at 06/15/2017 1250 Last data filed at 06/15/2017 0554 Gross per 24 hour  Intake 480 ml  Output 400 ml  Net 80 ml    LBM: Last BM Date: 06/15/17 Baseline Weight: Weight: 70.3 kg (155 lb) Most recent weight: Weight: 65.2 kg (143 lb 11.2 oz)(a scale)     Palliative Assessment/Data: 50%   Discussed with Dr Grandville Silos with Internal Medicine and Dr Marin Olp  Time In: 1000 Time Out: 1115 Time Total: 75 minutes Greater than 50%  of this time was spent counseling and coordinating care related to the above assessment and plan.  Signed by: Wadie Lessen, NP   Please contact Palliative Medicine Team phone at [phone] for questions and concerns.  For individual provider: See Shea Evans

## 2017-06-15 NOTE — Progress Notes (Deleted)
PT Cancellation Note  Patient Details Name: [name] MRN: [id number] DOB: 02/06/1956   Cancelled Treatment:    Reason Eval/Treat Not Completed: Patient not medically ready. Pt found to have bilateral LE DVTs. Will follow-up for PT evaluation and mobilization when medically appropriate.  [name], PT, DPT Acute Rehab Services  Pager: [place] 06/15/2017, 7:32 AM

## 2017-06-15 NOTE — Progress Notes (Signed)
Occupational Therapy Treatment Patient Details Name: [name] MRN: [id number] DOB: 1955-08-31 Today's Date: 06/15/2017    History of present illness Pt is a 62 y.o. admitted 06/13/17 with worsening congestion, cough, and SOB; worked up for possible COPD exacerbation. Found to have PE and bilateral LE DVT. PMH includes Stage IV adenocarcinoma, bony mets throughout spine/ribs/sacrum, tobacco abuse, chronic pancreatitis, anxiety, probable COPD.    OT comments  Pt had just finished showering and completing standing grooming tasks as well as dressing tasks on my arrival. Pt with increased work of breathing throughout but improving as time progressed. Educated pt concerning energy conservation strategies to incorporate into daily routine for ADL and IADL with handout provided. She demonstrates good awareness and understanding of strategies. She will have 24 hour assistance post-acute D/C and has met acute OT goals. Updated D/C recommendation as pt demonstrating good progress. No further acute OT needs identified. OT will sign off.    Follow Up Recommendations  Supervision - Intermittent;No OT follow up    Equipment Recommendations  Tub/shower seat    Recommendations for Other Services      Precautions / Restrictions Precautions Precautions: None Restrictions Weight Bearing Restrictions: No       Mobility Bed Mobility                  Transfers                      Balance                                           ADL either performed or assessed with clinical judgement   ADL                                         General ADL Comments: Pt reports just finishing shower, standing grooming tasks, and dressing without assistance today just prior to my arrival. Educated pt concerning energy conservation strategies to maximize independence and safety with ADL and functional mobility with handout provided. Pt will have assistance  from her significant other and demonstrates good understanding of energy conservation strategies.       Vision       Perception     Praxis      Cognition Arousal/Alertness: Awake/alert Behavior During Therapy: WFL for tasks assessed/performed Overall Cognitive Status: Within Functional Limits for tasks assessed                                          Exercises     Shoulder Instructions       General Comments Discussed energy conservation strategies at length with handout provided.     Pertinent Vitals/ Pain       Pain Assessment: Faces Faces Pain Scale: Hurts a little bit Pain Location: generalized Pain Descriptors / Indicators: Discomfort;Sore Pain Intervention(s): Monitored during session  Home Living                                          Prior Functioning/Environment  Frequency  Min 2X/week        Progress Toward Goals  OT Goals(current goals can now be found in the care plan section)  Progress towards OT goals: Goals met/education completed, patient discharged from OT  Acute Rehab OT Goals Patient Stated Goal: to get pain better under control OT Goal Formulation: With patient Time For Goal Achievement: 06/28/17 Potential to Achieve Goals: Good  Plan Discharge plan needs to be updated    Co-evaluation                 AM-PAC PT "6 Clicks" Daily Activity     Outcome Measure   Help from another person eating meals?: None Help from another person taking care of personal grooming?: A Little Help from another person toileting, which includes using toliet, bedpan, or urinal?: A Little Help from another person bathing (including washing, rinsing, drying)?: A Little Help from another person to put on and taking off regular upper body clothing?: A Little Help from another person to put on and taking off regular lower body clothing?: A Little 6 Click Score: 19    End of Session    OT Visit  Diagnosis: Unsteadiness on feet (R26.81)   Activity Tolerance Patient tolerated treatment well   Patient Left in bed;with call bell/phone within reach;with bed alarm set   Nurse Communication Mobility status;Patient requests pain meds        Time: 6838-7065 OT Time Calculation (min): 15 min  Charges: OT General Charges $OT Visit: 1 Visit OT Treatments $Therapeutic Activity: 8-22 mins  Norman Herrlich, MS OTR/L  Pager: [phone] 06/15/2017, 5:23 PM

## 2017-06-15 NOTE — Progress Notes (Signed)
Patient sad and tearful. Stated that is not able to get any sleep or relief with Dilaudid and Oxycodone. Paged MD. Ramelteon ordered and given.  Will continue to monitor.  Dhani Dannemiller, RN

## 2017-06-15 NOTE — Progress Notes (Signed)
SATURATION QUALIFICATIONS: (This note is used to comply with regulatory documentation for home oxygen)  Patient Saturations on Room Air at Rest = 90%  Patient Saturations on Room Air while Ambulating = 86%  Patient Saturations on 2 Liters of oxygen while Ambulating = 95%  Please briefly explain why patient needs home oxygen: pt becomes short of breath on room air while at rest and extremely short of breath while ambulating on room air; pt's SpO2 below desired level on room air

## 2017-06-15 NOTE — Progress Notes (Signed)
Pt states she does not want her Ensure at this time. Pt states she will ask for it when she wants it.

## 2017-06-15 NOTE — Progress Notes (Addendum)
Family Medicine Teaching Service Daily Progress Note Intern Pager: [phone]  Patient name: [name] Medical record number: [id number] Date of birth: September 22, 1955 Age: 62 y.o. Gender: female  Primary Care Provider: Esaw Grandchild, NP Consultants: None Code Status: Full  Pt Overview and Major Events to Date:  [name] is a 63 y.o. female presenting with 2 weeks of worsening congestion, cough, shortness of breath. 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 VSS this am. (2/26). Right lower lobe involving segmental and more peripheral branches as seen on CTA. We are treating this as a provoked thrombosis due to hypercoagulability from malignancy.  Will need 6 months of Lovenox. - Telemetry with continuous pulse ox - Discontinue Lovenox 1mg /kg every 12hr (ordered as lovenox per pharm);  - Eliquis 10mg  BID, will need 55m of therapy and possibly indefinite anticoagulation. - Still on 2L O2 [place]; Cont supplemental oxygen as necessary, keep sats >90% - amb pulse ox; may need DME oxygen on discharge  Possible COPD exacerbation - Continued O2 requirement needing 2L to keep O2 sats above 92%. VSS and no wheezing, DOB, or difficulty breathing but still has dry cough on exam this am (2/26). Still has Leukocytosis of 12.6 this am but improved from yesterday's 18.9 (2/26). - monitor pulse ox and resp - daily CBC - Duo nebs every 6h as needed  Anemia: Patient H/H appears to be slightly below baseline of around 10-10.5. Iron panel showed low iron, TIBC, and normal Ferritin. On the low end of normal for MCV. Could be early normocytic iron deficiency anemia vs anemia of chronic disease. - daily CBC  Bilateral DVT: Acute. Most likely due to hypercoagulable state  due to metastatic cancer. Stopping Lovenox (2/24 - 2/26). - Eliquis 10mg  BID, will need 76m of anticoagulation therapy and possibly indefinite anticoagulation.  Stage IV Right Lung Cancer: Chronic. She follows with Dr. Marin Olp her Oncologist in [facility]. They are aware she is here and are following. Pain was well controlled over night on home regimen with supplemental dilaudid. Adenocarcinoma of unknown primary versus NSCLC with systemic bony metastatic disease. Receives 50-75 mcg of fentanyl via 72-hour patch and 20 oxycodone every 6 hours as needed. - Convert DilaudidIV 1mg  to q4 prn to oral Hydromorphone 2mg  q4 prn - Cont Oxycodone 20mg  q6 scheduled  - Cont Fentanyl patch to 9mcg - Cont home Dexamthasone 36mg  daily - Started Ramelteon 8mg  QHS for difficulty with sleep - Bowel regimen Colace and MiraLAX - Wound care per nursing for Port-A-Cath incision  Lower extremity Edema: Acute. Improved.Patient still has no pitting edema this am (2/26). Patient has been taking Lasix 20 mg daily for the past 2 weeks as a new medication. BNP was normal at 89.5 - Cont to hold Lasix - Daily BMP  - I's and O's  Systemic weakness: Stable. Likely due to deconditioning in the setting of metastatic disease. -PT/OT -Nutrition consult  Chronic pancreatitis s/p biliary stenting: Chronic. Stable. No symptoms this am and abdominal exam unremarkable this morning (2/26). - continue to monitor abdominal exam  Anxiety  Depression, stable. Patient takes Ativan 1 mg 4 times daily.   -Cont Ativan 1mg  BID and monitor for worsening anxiety  -Monitor respiratory status  Tobacco abuse -Nicotine replacement patches as needed -Nursing to provide smoking cessation counseling  FEN/GI: Heart Healthy Diet, Nutrition consult  PPx: Lovenox for PE Tx  Disposition: inpatient, med-surg  Subjective:  Patient states she was able to get some sleep last night and think the medicine to help her sleep worked. She states  she is requiring the extra pain medication but when she gets it her pain is well controlled. She is still having a dry cough and still has chest and abdominal pain when she coughs. Her legs are "sensitive" when touched. Otherwise, she feels like she is doing "okay".  Objective: Temp:  [97.7 F (36.5 C)-98.2 F (36.8 C)] 97.7 F (36.5 C) (02/26 0551) Pulse Rate:  [73-90] 84 (02/26 0551) Resp:  [18] 18 (02/26 0551) BP: (109-135)/(63-66) 135/63 (02/26 0551) SpO2:  [94 %-98 %] 96 % (02/26 0551) Weight:  [143 lb 11.2 oz (65.2 kg)] 143 lb 11.2 oz (65.2 kg) (02/26 0551) Physical Exam: Gen: Alert and Oriented x 3, NAD HEENT: Normocephalic, atraumatic, PERRLA, EOMI CV: RRR, no murmurs, normal S1, S2 split, +2 pulses dorsalis pedis bilaterally Resp: Decreased breath sounds bibasilar lungs, NWOB, no wheezing, rales, or rhonchi Abd: non-distended, LUQ TTP, soft, +bs in all four quadrants MSK: FROM in all four extremities; calves bilaterally TTP Ext: no clubbing, cyanosis, or edema Skin: warm, dry, intact, no rashes  Laboratory: Recent Labs  Lab 06/13/17 0944 06/14/17 0617 06/15/17 0450  WBC 15.3* 18.9* 12.6*  HGB 10.6* 9.4* 9.1*  HCT 32.8* 29.5* 28.9*  PLT 98* 92* 89*   Recent Labs  Lab 06/13/17 0944 06/14/17 0617  NA 132* 130*  K 3.9 4.1  CL 95* 95*  CO2 24 24  BUN 12 10  CREATININE 0.60 0.49  CALCIUM 8.9 8.7*  GLUCOSE 129* 97   2/24 - BNP: 89.5 2/24 - Troponin I-stat: 0.00 2/25 - Procalcitonin: 0.24 2/25 - PT/INR: 13.3/1.02 2/25 - PTT: 38 2/26 - Iron & TIBC: Iron 23, TIBC 235, Sat Ratio 10% (all low) 2/26 - Ferritin: 296 (nl)  Imaging/Diagnostic Tests: 2/24 - CXR:  FINDINGS: No pneumothorax. The heart, hila, and mediastinum are unremarkable. Small hiatal hernia. The nodularity in the right upper lung on the recent PET-CT is not appreciated on this study.  IMPRESSION: No cause for shortness of breath or chest pain identified. The no nodularity in the right apex is  not well assessed with this study.  2/24 - [place] CT Angio: IMPRESSION: 1. Pulmonary emboli in the right lower lobe involving segmental and more peripheral branches. 2. Mild ground-glass in the medial right lung could be infectious. 3. The spiculated nodule in the right upper lobe is not significantly changed accounting for difference in technique. 4. Increasing soft tissue in the right hilum with encasement of the right upper lobe bronchus and right upper lobe pulmonary arterial branches resulting in some narrowing. This is suspicious for disease involvement. 5. Known bony metastatic disease. 6. Atherosclerotic changes in the nonaneurysmal aorta. 7. Moderate hiatal hernia.  Nuala Alpha, DO 06/15/2017, 6:37 AM PGY-1, [place] Intern pager: [phone], text pages welcome

## 2017-06-15 NOTE — Progress Notes (Deleted)
OT Cancellation Note  Patient Details Name: [name] MRN: [id number] DOB: Nov 28, 1955   Cancelled Treatment:    Reason Eval/Treat Not Completed: Medical issues which prohibited therapy. Pt found to have B DVT. Will hold treatment and await anticoagulation per protocol.   Norman Herrlich, MS OTR/L  Pager: [phone]   Norman Herrlich 06/15/2017, 7:37 AM

## 2017-06-15 NOTE — Evaluation (Addendum)
Physical Therapy Evaluation & Discharge Patient Details Name: [name] MRN: [id number] DOB: 11-22-1955 Today's Date: 06/15/2017   History of Present Illness  Pt is a 62 y.o. admitted 06/13/17 with worsening congestion, cough, and SOB; worked up for possible COPD exacerbation. Found to have PE and bilateral LE DVT. PMH includes Stage IV adenocarcinoma, bony mets throughout spine/ribs/sacrum, tobacco abuse, chronic pancreatitis, anxiety, probable COPD.     Clinical Impression  Patient evaluated by Physical Therapy with no further acute PT needs identified. 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 Today, pt indep with ambulation; remains limited by fatigue and DOE. SpO2 >90% on 2L O2 [place]. Educ on energy conservation, fall risk reduction, and importance of continued mobility, including frequent mobility breaks from sitting at desk during work day. All education has been completed and the patient has no further questions. PT is signing off. Thank you for this referral.    Follow Up Recommendations No PT follow up    Equipment Recommendations  None recommended by PT    Recommendations for Other Services       Precautions / Restrictions Precautions Precautions: None Restrictions Weight Bearing Restrictions: No      Mobility  Bed Mobility Overal bed mobility: Independent                Transfers Overall transfer level: Independent Equipment used: None Transfers: Sit to/from Stand              Ambulation/Gait Ambulation/Gait assistance: Independent Ambulation Distance (Feet): 120 Feet Assistive device: None Gait Pattern/deviations: Step-through pattern;Decreased stride length Gait velocity: Decreased Gait velocity interpretation: <1.8 ft/sec, indicative of risk for recurrent falls General Gait Details: Slow, controlled amb independently; 1x standing rest break secondary to fatigue and SOB.  SpO2 >90% on 2L O2 [place]  Stairs            Wheelchair Mobility    Modified Rankin (Stroke Patients Only)       Balance Overall balance assessment: No apparent balance deficits (not formally assessed)                                           Pertinent Vitals/Pain Pain Assessment: Faces Faces Pain Scale: Hurts a little bit Pain Location: Abdomen when coughing Pain Descriptors / Indicators: Sore Pain Intervention(s): Monitored during session;Limited activity within patient's tolerance;Other (comment)(educ on bear hugging pillow when coughing)    Home Living Family/patient expects to be discharged to:: Private residence Living Arrangements: Spouse/significant other Available Help at Discharge: Family;Available 24 hours/day Type of Home: House Home Access: Stairs to enter   [employer] of Steps: 2 Home Layout: One level Home Equipment: None Additional Comments: Significant other works, but he plans to be home first few days to assist if needed    Prior Function Level of Independence: Independent         Comments: 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; drives     Journalist, newspaper        Extremity/Trunk Assessment   Upper Extremity Assessment Upper Extremity Assessment: Overall WFL for tasks assessed    Lower Extremity Assessment Lower Extremity Assessment: Overall WFL for tasks assessed       Communication   Communication: No difficulties  Cognition Arousal/Alertness: Awake/alert Behavior During Therapy: WFL for tasks assessed/performed Overall Cognitive Status: Within Functional Limits for tasks  assessed                                        General Comments General comments (skin integrity, edema, etc.): Significant other present throughout session    Exercises     Assessment/Plan    PT Assessment Patent does not need any further PT services  PT Problem List         PT Treatment Interventions       PT Goals (Current goals can be found in the Care Plan section)  Acute Rehab PT Goals PT Goal Formulation: All assessment and education complete, DC therapy    Frequency     Barriers to discharge        Co-evaluation               AM-PAC PT "6 Clicks" Daily Activity  Outcome Measure Difficulty turning over in bed (including adjusting bedclothes, sheets and blankets)?: None Difficulty moving from lying on back to sitting on the side of the bed? : None Difficulty sitting down on and standing up from a chair with arms (e.g., wheelchair, bedside commode, etc,.)?: None Help needed moving to and from a bed to chair (including a wheelchair)?: None Help needed walking in hospital room?: None Help needed climbing 3-5 steps with a railing? : A Little 6 Click Score: 23    End of Session Equipment Utilized During Treatment: Gait belt;Oxygen Activity Tolerance: Patient tolerated treatment well;Patient limited by fatigue Patient left: in chair;with call bell/phone within reach;with family/visitor present Nurse Communication: Mobility status PT Visit Diagnosis: Other abnormalities of gait and mobility (R26.89)    Time: 0737-1062 PT Time Calculation (min) (ACUTE ONLY): 16 min   Charges:   PT Evaluation $PT Eval Moderate Complexity: 1 Mod     PT G Codes:       [name], PT, DPT Acute Rehab Services  Pager: [place] 06/15/2017, 1:11 PM

## 2017-06-16 ENCOUNTER — Telehealth: Payer: Self-pay | Admitting: *Deleted

## 2017-06-16 DIAGNOSIS — Z7189 Other specified counseling: Secondary | ICD-10-CM

## 2017-06-16 DIAGNOSIS — F411 Generalized anxiety disorder: Secondary | ICD-10-CM

## 2017-06-16 LAB — BASIC METABOLIC PANEL
Anion gap: 10 (ref 5–15)
BUN: 12 mg/dL (ref 6–20)
CALCIUM: 8.6 mg/dL — AB (ref 8.9–10.3)
CO2: 25 mmol/L (ref 22–32)
Chloride: 97 mmol/L — ABNORMAL LOW (ref 101–111)
Creatinine, Ser: 0.54 mg/dL (ref 0.44–1.00)
GFR calc Af Amer: >60 mL/min (ref >60)
GLUCOSE: 122 mg/dL — AB (ref 65–99)
Potassium: 4.4 mmol/L (ref 3.5–5.1)
Sodium: 132 mmol/L — ABNORMAL LOW (ref 135–145)

## 2017-06-16 LAB — CBC
HCT: 27 % — ABNORMAL LOW (ref 36.0–46.0)
Hemoglobin: 8.6 g/dL — ABNORMAL LOW (ref 12.0–15.0)
MCH: 28 pg (ref 26.0–34.0)
MCHC: 31.9 g/dL (ref 30.0–36.0)
MCV: 87.9 fL (ref 78.0–100.0)
PLATELETS: 86 10*3/uL — AB (ref 150–400)
RBC: 3.07 MIL/uL — ABNORMAL LOW (ref 3.87–5.11)
RDW: 16.3 % — AB (ref 11.5–15.5)
WBC: 13.5 10*3/uL — ABNORMAL HIGH (ref 4.0–10.5)

## 2017-06-16 LAB — HEMOGLOBIN AND HEMATOCRIT, BLOOD
HEMATOCRIT: 28.9 % — AB (ref 36.0–46.0)
Hemoglobin: 9.3 g/dL — ABNORMAL LOW (ref 12.0–15.0)

## 2017-06-16 LAB — PREPARE RBC (CROSSMATCH)

## 2017-06-16 MED ORDER — LORAZEPAM 1 MG PO TABS
1.0000 mg | ORAL_TABLET | Freq: Four times a day (QID) | ORAL | Status: DC | PRN
  Administered 2017-06-16 – 2017-06-17 (×4): 1 mg via ORAL
  Filled 2017-06-16 (×3): qty 1

## 2017-06-16 MED ORDER — LIDOCAINE 5 % EX PTCH: 1.0000 | MEDICATED_PATCH | CUTANEOUS | Status: DC

## 2017-06-16 MED ORDER — PANTOPRAZOLE SODIUM 40 MG PO TBEC
40.0000 mg | DELAYED_RELEASE_TABLET | Freq: Every day | ORAL | Status: DC
  Administered 2017-06-16 – 2017-06-17 (×2): 40 mg via ORAL
  Filled 2017-06-16 (×2): qty 1

## 2017-06-16 MED ORDER — PERFLUTREN LIPID MICROSPHERE
INTRAVENOUS | Status: AC
  Filled 2017-06-16: qty 10

## 2017-06-16 MED ORDER — METOCLOPRAMIDE HCL 5 MG PO TABS: 5.0000 mg | ORAL_TABLET | Freq: Three times a day (TID) | ORAL | Status: DC | PRN

## 2017-06-16 MED ORDER — SODIUM CHLORIDE 0.9 % IV SOLN
Freq: Once | INTRAVENOUS | Status: AC
  Administered 2017-06-16: 16:00:00 via INTRAVENOUS

## 2017-06-16 MED ORDER — GABAPENTIN 600 MG PO TABS
300.0000 mg | ORAL_TABLET | Freq: Two times a day (BID) | ORAL | Status: DC
  Administered 2017-06-16 – 2017-06-17 (×3): 300 mg via ORAL
  Filled 2017-06-16 (×4): qty 1

## 2017-06-16 MED ORDER — MIRTAZAPINE 15 MG PO TABS
7.5000 mg | ORAL_TABLET | Freq: Every day | ORAL | Status: DC
  Administered 2017-06-16: 7.5 mg via ORAL
  Filled 2017-06-16: qty 1

## 2017-06-16 MED ORDER — IPRATROPIUM-ALBUTEROL 0.5-2.5 (3) MG/3ML IN SOLN
3.0000 mL | Freq: Four times a day (QID) | RESPIRATORY_TRACT | Status: DC | PRN
  Administered 2017-06-16: 3 mL via RESPIRATORY_TRACT
  Filled 2017-06-16: qty 3

## 2017-06-16 MED ORDER — FUROSEMIDE 10 MG/ML IJ SOLN
20.0000 mg | Freq: Once | INTRAMUSCULAR | Status: AC
  Administered 2017-06-16: 20 mg via INTRAVENOUS
  Filled 2017-06-16: qty 2

## 2017-06-16 MED ORDER — SODIUM CHLORIDE 0.9% FLUSH: 10.0000 mL | INTRAVENOUS | Status: DC | PRN

## 2017-06-16 NOTE — Progress Notes (Signed)
Call placed to Dr Antonieta Pert office again to see if he would like to proceed with second bag of PRBCs that was ordered; now that we have a resulted HGB from just prior to first bag of 9.3.   Per Dr. Marin Olp proceed with second unit.Marland Kitchen

## 2017-06-16 NOTE — Telephone Encounter (Signed)
Patient is currently admitted at [facility]. Message left on nurse line by patient.  She states:   "I'm done. I don't want to hurt anymore. I can't keep doing this. Tell me what to do so I don't have to hurt anymore"  Spoke with Dr Marin Olp. He requests that someone from the palliative care team see patient, preferably Wadie Lessen, as she is the provider who saw patient yesterday.   Called the Palliative Care Team Care Line. Unable to speak with someone, but message left requesting that they see patient.   Also attempted to call patient, but she didn't not pick up. Message left letting her know of Dr Antonieta Pert request.

## 2017-06-16 NOTE — Progress Notes (Signed)
Call placed to Dr Marin Olp @ [phone] to verify that two units with HGB of 8.6 correct, also per blood bank need to verify pt does not need any special considerations for blood.

## 2017-06-16 NOTE — Progress Notes (Signed)
Patient complained that he is having trouble sleeping again. Scheduled and PRN pain medication given, sleeping pill Ramelteon given as well. Paged MD on call and informed. Patient advised that MD would like her to try sleeping on her own since she took already medications for insomnia. No new orders at this time Patient verbalized understanding.   Leverett Camplin, RN

## 2017-06-16 NOTE — Progress Notes (Signed)
RT note: RT called by RN for PRN treatment for patient. Upon arrival to patient['s room, RT found patient visibly upset and crying hysterically. Patient states she is in extreme pain and no on will give her anything. Breath sounds are clear and sats are 94% on 1L [place]. It seems patients issues are more from pain and anxiety than respiratory related. RT will talk to RN concerning medication patient may be given to help.

## 2017-06-16 NOTE — Progress Notes (Signed)
Family Medicine Teaching Service Daily Progress Note Intern Pager: [phone]  Patient name: [name] Medical record number: [id number] Date of birth: [birth year] Age: 62 y.o. Gender: female  Primary Care Provider: Esaw Grandchild, NP Consultants: None Code Status: Full  Pt Overview and Major Events to Date:  [name] is a 62 y.o. female presenting with 2 weeks of worsening congestion, cough, shortness of breath. 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 VSS are stable this morning and have remained stable over the past 48hrs. (2/27). Right lower lobe involving segmental and more peripheral branches as seen on CTA. We are treating this as a provoked thrombosis due to hypercoagulability from malignancy. Will need 6 months of Lovenox. - Telemetry with continuous pulse ox - Cont Eliquis 10mg  BID for 10 days total, then convert to 5mg  daily, will need 6m of therapy and possibly indefinite anticoagulation. - Still on 2L O2 ; Cont supplemental oxygen as necessary, keep sats >90% - amb pulse ox; may need DME oxygen on discharge; patient had desat to 86% while ambulating to room air; 95% on 2L while ambulating. 90%  Possible COPD exacerbation - Continued O2 requirement needing 2L to keep O2 sats above 92%. VSS and no wheezing, still has SOB on exertionstill has and dry cough on exam this am (2/27).  - monitor pulse ox and resp - daily CBC - Duo nebs every 6h as needed  Herpetic Rash: Chronic. Questionable about the cause as distribution is NOT in a dermatomal fashion and crosses the midline. Patient states she has had this for awhile and she took "about 4 days" worth of Valtrex before being admitted to the hospital. The rash is healing but she is  still experiencing burning and soreness. - Start Gabapentin 300mg  BID - Lidocaine patch - Contact precautions with guaze to cover  Adjustment disorder with anxious features: Acute. Patient endorses increasingly difficult to control anxiety over her recent diagnosis of metastatic cancer. She did not have this before and it has been less than 3 months of symptoms according to the patient. - Starting mirtazapine 7.5mg  daily as patient is also experiencing nausea and difficulty sleeping  Anemia of Chronic Disease: Chronic. Patient H/H appears to be slightly below baseline of around 10-10.5. Iron panel showed low iron, low TIBC, and normal Ferritin. On the low end of normal for MCV. Given presence of metatstatic disease this is most likely anemia of chronic disease. - daily CBC  Bilateral DVT: Acute. Most likely due to hypercoagulable state due to metastatic cancer. Stopping Lovenox (2/24 - 2/26). - Eliquis 10mg  BID, will need 23m of anticoagulation therapy and possibly indefinite anticoagulation.  Stage IV Right Lung Cancer w/metastasis: Chronic. She follows with Dr. Marin Olp her Oncologist in [facility]. Pain has been better controlled since adding hydromorphone for breakthrough pain. Patient is currently on 144 MME for pain control. Adenocarcinoma of unknown primary versus NSCLC with systemic bony metastatic disease. - Palliative following; appreciate recs - Cont oral Hydromorphone 2mg  q4 prn (only needed 3 times 24MME) - Cont Oxycodone 20mg  q12 scheduled (60MME) - Cont Fentanyl patch to 36mcg (60MME) - Cont home Dexamthasone 18mg  daily - Started Ramelteon 8mg  QHS for difficulty with sleep - Bowel regimen Colace and MiraLAX - Blood transfusion 2U per Dr. Marin Olp - Lasix 20mg  IV x 1 dose today  Lower  extremity Edema: Acute. Improved.Patient still has no pitting edema this am (2/27). Patient has been taking Lasix 20 mg daily for the past 2 weeks as a new medication. BNP was normal at 89.5 - Cont  Lasix, IV in addition to Lasix 20mg  oral to avoid volume overload from transfusion - Daily BMP  - I's and O's  Systemic weakness: Stable. Likely due to deconditioning in the setting of metastatic disease. -PT/OT -Nutrition consult  Chronic pancreatitis s/p biliary stenting: Chronic. Stable. No symptoms this am and abdominal exam unremarkable this morning (2/27). - continue to monitor abdominal exam  Anxiety  Depression, stable. Patient takes Ativan 1 mg 4 times daily.   -Cont Ativan 1mg  BID and monitor for worsening anxiety  -Monitor respiratory status  Tobacco abuse -Nicotine replacement patches as needed -Nursing to provide smoking cessation counseling  FEN/GI: Heart Healthy Diet, Nutrition consult PPx: Lovenox for PE Tx  Disposition: inpatient, med-surg  Subjective:  Patient states she was still having difficulty sleeping and her anxiety has been somewhat difficult to control. She is having significant pain but she says it is controlled on her current pain regimen. She still gets SOB with minimal exertion and still has a dry cough with pain when she coughs. No chest pain or abdominal pain.  She has a new complaint of slow healing blisters on her lower back just above her gluteus. It has bilateral distribution and appears to be in various stages of healing with most that have formed scabs. They "burn" and are sore from laying on them for the past few days. She states she has gotten outbreaks like this before and usually takes Valtrex for a "couple of days" and it usually gets better. This feels like previous outbreaks.  Objective: Temp:  [97.7 F (36.5 C)-98.1 F (36.7 C)] 97.7 F (36.5 C) (02/27 0502) Pulse Rate:  [77-91] 77 (02/27 0502) Resp:  [18] 18 (02/27 0502) BP: (122-140)/(64-68) 130/65 (02/27 0502) SpO2:  [92 %-95 %] 95 % (02/27 0502) Weight:  [144 lb 8 oz (65.5 kg)] 144 lb 8 oz (65.5 kg) (02/27 0502) Physical Exam: Gen: Alert and Oriented x 3, NAD HEENT:  Normocephalic, atraumatic, PERRLA, EOMI CV: RRR, no murmurs, normal S1, S2 split, +2 pulses dorsalis pedis bilaterally Resp: decreased breath sounds, no wheezing, rales, or rhonchi Abd: non-distended, non-tender, soft, +bs in all four quadrants MSK: FROM in all four extremities Ext: no clubbing, cyanosis, or edema Skin: warm, dry, intact, no rashes  Laboratory: Recent Labs  Lab 06/14/17 0617 06/15/17 0450 06/16/17 0442  WBC 18.9* 12.6* 13.5*  HGB 9.4* 9.1* 8.6*  HCT 29.5* 28.9* 27.0*  PLT 92* 89* 86*   Recent Labs  Lab 06/14/17 0617 06/15/17 0450 06/16/17 0442  NA 130* 131* 132*  K 4.1 4.3 4.4  CL 95* 95* 97*  CO2 24 25 25   BUN 10 12 12   CREATININE 0.49 0.47 0.54  CALCIUM 8.7* 8.5* 8.6*  GLUCOSE 97 157* 122*   2/24 - BNP: 89.5 2/24 - Troponin I-stat: 0.00 2/25 - Procalcitonin: 0.24 2/25 - PT/INR: 13.3/1.02 2/25 - PTT: 38 2/26 - Iron & TIBC: Iron 23, TIBC 235, Sat Ratio 10% (all low) 2/26 - Ferritin: 296 (nl)  Imaging/Diagnostic Tests: 2/24 - CXR:  FINDINGS: No pneumothorax. The heart, hila, and mediastinum are unremarkable. Small hiatal hernia. The nodularity in the right upper lung on the recent PET-CT is not appreciated on this study.  IMPRESSION: No cause for shortness of breath or chest pain identified. The no nodularity  in the right apex is not well assessed with this study.  2/24 - [place] CT Angio: IMPRESSION: 1. Pulmonary emboli in the right lower lobe involving segmental and more peripheral branches. 2. Mild ground-glass in the medial right lung could be infectious. 3. The spiculated nodule in the right upper lobe is not significantly changed accounting for difference in technique. 4. Increasing soft tissue in the right hilum with encasement of the right upper lobe bronchus and right upper lobe pulmonary arterial branches resulting in some narrowing. This is suspicious for disease involvement. 5. Known bony metastatic disease. 6. Atherosclerotic  changes in the nonaneurysmal aorta. 7. Moderate hiatal hernia.  Nuala Alpha, DO 06/16/2017, 6:50 AM PGY-1, [place] Intern pager: [phone], text pages welcome

## 2017-06-16 NOTE — Progress Notes (Signed)
Patient resting comfortably during shift report. Denies complaints.  

## 2017-06-16 NOTE — Progress Notes (Signed)
Call received on IP call phone for guidance re: isolations precautions for patient in regards to "blistery" rash on sacrum. Per documentation patient has had numerous herpetic breakouts in this areas. Crosses midline and I do not see concern for zoster. Caryl Pina RN describes the blisters as scabbed over. Our CDC guidance for Herpes simplex is for mucocutaneous, disseminated or primary, severe is contact precautions until lesions dry and crusted. Mucocutaneous, recurrent(skin, oral, genital) are standard precautions and I believe she falls into the latter category. Cover area and discuss with provider if they agree this is appropriate and no concern for Zoster. (If concern for Zoster needs airborne and contact until validated that all lesions dry/crusted)

## 2017-06-16 NOTE — Progress Notes (Signed)
Call placed to RT requesting breathing treatment.

## 2017-06-17 LAB — TYPE AND SCREEN
ABO/RH(D): A POS
ANTIBODY SCREEN: NEGATIVE
Unit division: 0
Unit division: 0

## 2017-06-17 LAB — BPAM RBC
BLOOD PRODUCT EXPIRATION DATE: 201903162359
BLOOD PRODUCT EXPIRATION DATE: 201903172359
ISSUE DATE / TIME: 201902271233
ISSUE DATE / TIME: 201902271738
UNIT TYPE AND RH: 6200
Unit Type and Rh: 6200

## 2017-06-17 LAB — GLUCOSE, CAPILLARY: GLUCOSE-CAPILLARY: 92 mg/dL (ref 65–99)

## 2017-06-17 LAB — CBC
HEMATOCRIT: 35.8 % — AB (ref 36.0–46.0)
Hemoglobin: 11.7 g/dL — ABNORMAL LOW (ref 12.0–15.0)
MCH: 28.4 pg (ref 26.0–34.0)
MCHC: 32.7 g/dL (ref 30.0–36.0)
MCV: 86.9 fL (ref 78.0–100.0)
Platelets: 81 10*3/uL — ABNORMAL LOW (ref 150–400)
RBC: 4.12 MIL/uL (ref 3.87–5.11)
RDW: 16.4 % — ABNORMAL HIGH (ref 11.5–15.5)
WBC: 14.6 10*3/uL — AB (ref 4.0–10.5)

## 2017-06-17 MED ORDER — HEPARIN SOD (PORK) LOCK FLUSH 100 UNIT/ML IV SOLN
500.0000 [IU] | INTRAVENOUS | Status: AC | PRN
  Administered 2017-06-17: 500 [IU]

## 2017-06-17 MED ORDER — GABAPENTIN 600 MG PO TABS: 300.0000 mg | ORAL_TABLET | Freq: Two times a day (BID) | ORAL | 0 refills | Status: DC

## 2017-06-17 MED ORDER — OXYCODONE HCL ER 20 MG PO T12A: 20.0000 mg | EXTENDED_RELEASE_TABLET | Freq: Two times a day (BID) | ORAL | 0 refills | Status: DC

## 2017-06-17 MED ORDER — HYDROMORPHONE HCL 2 MG PO TABS: 2.0000 mg | ORAL_TABLET | ORAL | 0 refills | Status: DC | PRN

## 2017-06-17 MED ORDER — PANTOPRAZOLE SODIUM 40 MG PO TBEC: 40.0000 mg | DELAYED_RELEASE_TABLET | Freq: Every day | ORAL | 0 refills | Status: DC

## 2017-06-17 MED ORDER — APIXABAN 5 MG PO TABS: 10.0000 mg | ORAL_TABLET | Freq: Every day | ORAL | 0 refills | Status: DC

## 2017-06-17 MED ORDER — FENTANYL 75 MCG/HR TD PT72: 75.0000 ug | MEDICATED_PATCH | TRANSDERMAL | 0 refills | Status: AC

## 2017-06-17 MED ORDER — IPRATROPIUM-ALBUTEROL 0.5-2.5 (3) MG/3ML IN SOLN: 3.0000 mL | Freq: Four times a day (QID) | RESPIRATORY_TRACT | 0 refills | Status: DC | PRN

## 2017-06-17 MED ORDER — APIXABAN 5 MG PO TABS: 5.0000 mg | ORAL_TABLET | Freq: Every day | ORAL | 2 refills | Status: DC

## 2017-06-17 MED ORDER — RAMELTEON 8 MG PO TABS: 8.0000 mg | ORAL_TABLET | Freq: Every day | ORAL | 0 refills | Status: DC

## 2017-06-17 MED ORDER — MIRTAZAPINE 7.5 MG PO TABS: 7.5000 mg | ORAL_TABLET | Freq: Every day | ORAL | 0 refills | Status: AC

## 2017-06-17 MED ORDER — DEXAMETHASONE 6 MG PO TABS: 18.0000 mg | ORAL_TABLET | Freq: Every day | ORAL | 0 refills | Status: DC

## 2017-06-17 MED ORDER — ENSURE ENLIVE PO LIQD: 237.0000 mL | Freq: Two times a day (BID) | ORAL | 12 refills | Status: DC

## 2017-06-17 MED FILL — MIRTAZAPINE 7.5 MG TABLET: 7.5 | 30 days supply | Qty: 30 | Fill #0

## 2017-06-17 MED FILL — OxyCONTIN 20 MG T12A: 20 | 7 days supply | Qty: 14 | Fill #0

## 2017-06-17 MED FILL — PANTOPRAZOLE SOD DR 40 MG T: 40 | 30 days supply | Qty: 30 | Fill #0

## 2017-06-17 MED FILL — DEXAMETHASONE 6 MG TABLET: 6 | 30 days supply | Qty: 90 | Fill #0

## 2017-06-17 MED FILL — IPRAT-ALBUT 0.5-3(2.5) MG/3: 0.5-2.5 (3) | 30 days supply | Qty: 360 | Fill #0

## 2017-06-17 MED FILL — GABAPENTIN 600 MG TABLET: 600 | 60 days supply | Qty: 60 | Fill #0

## 2017-06-17 MED FILL — fentaNYL 75 MCG/HR PT72: 75 | 15 days supply | Qty: 5 | Fill #0

## 2017-06-17 MED FILL — ELIQUIS 5 MG TABLET: 5 | 90 days supply | Qty: 111 | Fill #0

## 2017-06-17 MED FILL — HYDROmorphone HCL 2 MG TABS: 2 | 5 days supply | Qty: 28 | Fill #0

## 2017-06-17 NOTE — Progress Notes (Addendum)
Patient ID: [name], female   DOB: Feb 17, 1956, 62 y.o.   MRN: [id number]  This NP visited patient at the bedside as a follow up to  yesterday's [place] for pain assessment, emotional support.   Meet with [name] several times throughout the day  [name], "overwhelmed with everything".  Had PAC accessed today, blood transfusions and skeptical regarding pain management changes and strategy.  Even though she verbalizes that her pain is better today, she called Dr Antonieta Pert office telling them she did not want to continue on a treatment path and just wanted pain medicine when ever she wanted it and "how ever much it took".  Spoke with patient and her SO/[name].  We discussed her GOCs and again pain management  strategies.    Emotional support and reflective listening;  Patient was able to settle and speak to her acceptance of available treatments and hope for improved quality/quantity of life within the context of terminal disease.  She recognizes that she "gets overwhelmed".  No changes to oral pain medications, increased ativan to 1 mg po every 6 hrs prn.  Will f/u in the morning.  Discussed with patient the importance of continued conversation with family and their  medical providers regarding overall plan of care and treatment options,  ensuring decisions are within the context of the patients values and GOCs.  Questions and concerns addressed   Discussed with Dr Marin Olp  Time in  1600         Time out 1645   Total time spent on the unit was 45 minutes   Greater than 50% of the time was spent in counseling and coordination of care  Wadie Lessen NP  Palliative Medicine Team Team Phone # [phone] Pager [phone]

## 2017-06-17 NOTE — Progress Notes (Signed)
Discharge teaching complete. Meds, diet, activity, follow up appointments reviewed and all questions answered. Husband at bedside to hear instructions. Eliquis gone over in full detail. Prescriptions sent to pharmacy.

## 2017-06-17 NOTE — Progress Notes (Signed)
Patient discharged home via wheelchair with husband.  

## 2017-06-17 NOTE — Progress Notes (Signed)
Patient ID: [name], female   DOB: [birth year], 62 y.o.   MRN: [id number]  This NP visited patient at the bedside as a follow up for palliative medicine needs and emotional support.  Spoke with patient this morning and she tells me that she had a good night sleep and that her pain is better controlled.  She is anticipating discharge home today and is "ready".  She plans to see Dr. Marin Olp next week and understands that Dr. Marin Olp will be helping her with her cancer pain management.    I discussed with patient the importance of continued conversation with her  family and her medical providers regarding overall plan of care and treatment options,  ensuring decisions are within the context of the patients values and GOCs.    I encouraged [name] consider community based palliative services through hospice and palliative care of [place] and I gave her a contact number if and when she was interested.  Questions and concerns addressed    Time in  0800         Time out   0825   Total time spent on the unit was 25 minutes  Greater than 50% of the time was spent in counseling and coordination of care  Wadie Lessen NP  Palliative Medicine Team Team Phone # [phone] Pager [phone]

## 2017-06-17 NOTE — Progress Notes (Signed)
Family Medicine Teaching Service Daily Progress Note Intern Pager: [phone]  Patient name: [name] Medical record number: [id number] Date of birth: [birth year] Age: 62 y.o. Gender: female  Primary Care Provider: Nuala Alpha, DO Consultants: None Code Status: Full  Pt Overview and Major Events to Date:  [name] is a 61 y.o. female presenting with 2 weeks of worsening congestion, cough, shortness of breath. 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 VSS are stable this morning and have remained stable over the past 48hrs. (2/27). Right lower lobe involving segmental and more peripheral branches as seen on CTA. We are treating this as a provoked thrombosis due to hypercoagulability from malignancy. Will need 6 months of Lovenox. - D/c telemetry with continuous pulse ox - Cont Eliquis (2/27-) 10mg  BID for 10 days total, then convert to 5mg  daily, will need 12m of therapy and possibly indefinite anticoagulation. - Still on 2L O2 [place]; Cont supplemental oxygen as necessary, keep sats >90% - amb pulse ox; may need DME oxygen on discharge; patient had desat to 86% while ambulating to room air; 95% on 2L while ambulating. 90%  Stage IV Right Lung Cancer w/metastasis: Chronic. She follows with Dr. Marin Olp her Oncologist in [facility]. Pain has been better controlled since adding hydromorphone for breakthrough pain. Patient is currently on 152 MME for pain control. Adenocarcinoma of unknown primary versus NSCLC with systemic bony metastatic disease. - Palliative following; appreciate recs - Cont oral Hydromorphone 2mg  q4 prn (needed 4 times 32MME) - Cont Oxycodone 20mg  q12 scheduled (60MME) - Cont Fentanyl patch to 22mcg every 72hrs (60MME per day) -  Cont home Dexamthasone 18mg  daily - Cont Ramelteon 8mg  QHS for difficulty with sleep - Bowel regimen Colace and MiraLAX - Send home with enough pain meds to last until Sunday  Possible COPD exacerbation - Continued O2 requirement needing 2L to keep O2 sats above 92%. VSS and no wheezing, still has SOB on exertionstill has and dry cough on exam this am (2/27).  - monitor pulse ox and resp - daily CBC - Duo nebs every 6h as needed  Herpetic Rash?: Chronic. Recurring. Improved this morning with use of heating pad. Patient has tried lidocaine patch in the past with no benefit. Questionable about the cause as distribution is NOT in a dermatomal fashion and crosses the midline. Patient states she has had this for awhile and she took "about 4 days" worth of Valtrex before being admitted to the hospital. The rash is healing but she is still experiencing burning and soreness. - Cont Gabapentin 300mg  BID - D/c Lidocaine patch - Contact precautions with guaze to cover  Adjustment disorder with anxious features: Acute. Improved this am (2/28). Patient endorses still some anxiety but is coping better at dealing with " my emotios". She did not have this before and it has been less than 3 months of symptoms according to the patient. - Cont mirtazapine 7.5mg  daily as patient is also experiencing nausea and difficulty sleeping  Anemia of Chronic Disease: Chronic. Patient H/H is 11.7 this am s/p transfusion of 2 units. Iron panel showed low iron, low TIBC, and normal Ferritin. On the low end of normal for MCV. Given presence of metatstatic disease this is most likely anemia of chronic disease. - daily CBC  Bilateral DVT: Acute. Most likely due to hypercoagulable  state due to metastatic cancer. Stopping Lovenox (2/24 - 2/26). - Eliquis 10mg  BID, will need 19m of anticoagulation therapy and possibly indefinite anticoagulation.  Lower extremity Edema: Acute. Improved. Patient still has no pitting edema this am  (2/28). Patient has been taking Lasix 20 mg daily for the past 2 weeks as a new medication. BNP was normal at 89.5 - Cont Lasix 20mg  oral daily - Daily BMP  - I's and O's  Systemic weakness: Stable. Likely due to deconditioning in the setting of metastatic disease. -PT/OT -Nutrition consult  Chronic pancreatitis s/p biliary stenting: Chronic. Stable. No symptoms this am and abdominal exam unremarkable this morning (2/27). - continue to monitor abdominal exam  Anxiety  Depression, stable. Patient takes Ativan 1 mg 4 times daily.   -Cont Ativan 1mg  BID and monitor for worsening anxiety  -Monitor respiratory status  Tobacco abuse -Nicotine replacement patches as needed -Nursing to provide smoking cessation counseling  FEN/GI: Heart Healthy Diet, Nutrition consult PPx: Lovenox for PE Tx  Disposition: Dispo home  Subjective:  Patient states she is feeling much better. Her pain is well controlled on the current medication regimen. She still gets SOB on exertion easily but maintains good O2 sats with 2L of oxygen [place]. Pain from the rash on her back is improved with heating pad. Otherwise, she denies SOB, chest pain, nausea, vomiting, abdominal pain.  Objective: Temp:  [98 F (36.7 C)-98.3 F (36.8 C)] 98.3 F (36.8 C) (02/28 0432) Pulse Rate:  [69-98] 77 (02/28 0432) Resp:  [16-22] 18 (02/28 0432) BP: (90-156)/(57-72) 136/69 (02/28 0432) SpO2:  [93 %-96 %] 96 % (02/28 0432) FiO2 (%):  [24 %] 24 % (02/27 1025) Weight:  [141 lb 9.6 oz (64.2 kg)] 141 lb 9.6 oz (64.2 kg) (02/28 0432) Physical Exam: Gen: Alert and Oriented x 3, NAD HEENT: Normocephalic, atraumatic, PERRLA, EOMI CV: RRR, no murmurs, normal S1, S2 split,  Resp: CTAB, no wheezing, rales, or rhonchi, comfortable work of breathing Abd: non-distended, non-tender, soft, +bs in all four quadrants MSK: FROM in all four extremities Ext: no clubbing, cyanosis, or edema Skin: warm, dry, intact, no  rashes  Laboratory: Recent Labs  Lab 06/15/17 0450 06/16/17 0442 06/16/17 1230 06/17/17 0411  WBC 12.6* 13.5*  --  14.6*  HGB 9.1* 8.6* 9.3* 11.7*  HCT 28.9* 27.0* 28.9* 35.8*  PLT 89* 86*  --  81*   Recent Labs  Lab 06/14/17 0617 06/15/17 0450 06/16/17 0442  NA 130* 131* 132*  K 4.1 4.3 4.4  CL 95* 95* 97*  CO2 24 25 25   BUN 10 12 12   CREATININE 0.49 0.47 0.54  CALCIUM 8.7* 8.5* 8.6*  GLUCOSE 97 157* 122*   2/24 - BNP: 89.5 2/24 - Troponin I-stat: 0.00 2/25 - Procalcitonin: 0.24 2/25 - PT/INR: 13.3/1.02 2/25 - PTT: 38 2/26 - Iron & TIBC: Iron 23, TIBC 235, Sat Ratio 10% (all low) 2/26 - Ferritin: 296 (nl)  Imaging/Diagnostic Tests: 2/24 - CXR:  FINDINGS: No pneumothorax. The heart, hila, and mediastinum are unremarkable. Small hiatal hernia. The nodularity in the right upper lung on the recent PET-CT is not appreciated on this study.  IMPRESSION: No cause for shortness of breath or chest pain identified. The no nodularity in the right apex is not well assessed with this study.  2/24 - [place] CT Angio: IMPRESSION: 1. Pulmonary emboli in the right lower lobe involving segmental and more peripheral branches. 2. Mild ground-glass in the medial right lung could be infectious. 3. The spiculated  nodule in the right upper lobe is not significantly changed accounting for difference in technique. 4. Increasing soft tissue in the right hilum with encasement of the right upper lobe bronchus and right upper lobe pulmonary arterial branches resulting in some narrowing. This is suspicious for disease involvement. 5. Known bony metastatic disease. 6. Atherosclerotic changes in the nonaneurysmal aorta. 7. Moderate hiatal hernia.  Nuala Alpha, DO 06/17/2017, 11:35 AM PGY-1, [place] Intern pager: [phone], text pages welcome

## 2017-06-17 NOTE — Progress Notes (Signed)
Ms. Poplar seems to be in good spirits this morning.  She got 2 units of blood yesterday.  She tolerated this well.  She did have a little bit of a anxiety attack.  She said that she wanted to stop everything.  However, she was talking to [facility] from palliative care.  [facility] really help [place] out quite a bit.  Ms. Tartaglia's biggest concerns are pain control at home.  Her blood work today shows a hemoglobin of 11.7 with hematocrit of 35.8.  Her platelet count is trending downward.  This might be reflective of malignancy in her marrow.  She is on Xarelto.  She is doing well on Xarelto.  She really needs to go home today.  I think she will do much better at home.  She will be in a much more familiar surrounding.  She will be able to have a little bit more freedom.  There is no bleeding.  Again, she seems to be in much better spirits this morning.  I really think that the blood transfusion has helped her.  We really need to try to get her home today.  I think this will make her feel better.  Once she gets home, we can get her started on treatment for her metastatic lung cancer on Monday.  Lattie Haw, MD  Colossians 3:23

## 2017-06-17 NOTE — Progress Notes (Addendum)
Home oxygen ordered for home along with shower stool; awaiting for qualifying oxygen sats before home oxygen can be delivered to to the room; B Pennie Rushing [phone]  1:54 pm - Rolling walker and nebulizer machine ordered for home; CM talked to [facility] with [facility], he stated that they can deliver the DME to her home along with the oxygen. Mindi Slicker [facility] [phone]

## 2017-06-17 NOTE — Progress Notes (Signed)
SATURATION QUALIFICATIONS: (This note is used to comply with regulatory documentation for home oxygen)  Patient Saturations on Room Air at Rest = 90%  Patient Saturations on Room Air while Ambulating = 88%  Patient Saturations on 2 Liters of oxygen while Ambulating = 96  Please briefly explain why patient needs home oxygen: O2 sats drop when not on oxygen

## 2017-06-18 ENCOUNTER — Other Ambulatory Visit: Payer: Self-pay | Admitting: Hematology & Oncology

## 2017-06-18 ENCOUNTER — Telehealth: Payer: Self-pay

## 2017-06-18 ENCOUNTER — Other Ambulatory Visit: Payer: Self-pay | Admitting: *Deleted

## 2017-06-18 ENCOUNTER — Telehealth: Payer: Self-pay | Admitting: Hematology & Oncology

## 2017-06-18 DIAGNOSIS — C3490 Malignant neoplasm of unspecified part of unspecified bronchus or lung: Secondary | ICD-10-CM

## 2017-06-18 DIAGNOSIS — D508 Other iron deficiency anemias: Secondary | ICD-10-CM

## 2017-06-18 MED FILL — FUROSEMIDE 20 MG TABS: 20 | 5 days supply | Qty: 5 | Fill #0

## 2017-06-18 NOTE — Telephone Encounter (Signed)
I received a call from [place] requesting a refill of lasix 20 mg daily for Ms. Medine that had been prescribed by Dr. Sondra Come on 06/07/17 for a total of 10 tablets. Ms. Kozub was concerned because she would run out of the medicine over the weekend and her continued her symptoms. I spoke with Dr. Isidore Moos who is covering for Dr. Sondra Come today and she agreed to prescribe 20 mg lasix daily for 5 days, and recommended that she contact her PCP or Dr. Marin Olp to be assessed for further prescriptions of lasix related to her symptoms of lower extremity edema. I have called and left a voice mail on Ms. Palleschi's phone and informed her of the 5 days supply of lasix at [facility] and the need to call her PCP or Dr. Marin Olp regarding continuation of the medicine. I left my direct number if she had any further questions or concerns.

## 2017-06-18 NOTE — Telephone Encounter (Signed)
sw patient to confirm 3/4 appt at 11 am per Dr E sch msg

## 2017-06-21 ENCOUNTER — Inpatient Hospital Stay: Payer: BLUE CROSS/BLUE SHIELD

## 2017-06-21 ENCOUNTER — Other Ambulatory Visit: Payer: Self-pay | Admitting: *Deleted

## 2017-06-21 ENCOUNTER — Inpatient Hospital Stay: Payer: BLUE CROSS/BLUE SHIELD | Attending: Hematology & Oncology

## 2017-06-21 ENCOUNTER — Other Ambulatory Visit: Payer: Self-pay

## 2017-06-21 ENCOUNTER — Inpatient Hospital Stay (HOSPITAL_BASED_OUTPATIENT_CLINIC_OR_DEPARTMENT_OTHER): Payer: BLUE CROSS/BLUE SHIELD | Admitting: Family

## 2017-06-21 ENCOUNTER — Encounter: Payer: Self-pay | Admitting: *Deleted

## 2017-06-21 VITALS — BP 141/75 | HR 89 | Temp 97.9°F | Resp 18 | Wt 147.0 lb

## 2017-06-21 DIAGNOSIS — Z79899 Other long term (current) drug therapy: Secondary | ICD-10-CM | POA: Diagnosis not present

## 2017-06-21 DIAGNOSIS — C7951 Secondary malignant neoplasm of bone: Secondary | ICD-10-CM | POA: Insufficient documentation

## 2017-06-21 DIAGNOSIS — D509 Iron deficiency anemia, unspecified: Secondary | ICD-10-CM

## 2017-06-21 DIAGNOSIS — E538 Deficiency of other specified B group vitamins: Secondary | ICD-10-CM

## 2017-06-21 DIAGNOSIS — Z5111 Encounter for antineoplastic chemotherapy: Secondary | ICD-10-CM | POA: Insufficient documentation

## 2017-06-21 DIAGNOSIS — I2699 Other pulmonary embolism without acute cor pulmonale: Secondary | ICD-10-CM

## 2017-06-21 DIAGNOSIS — I82403 Acute embolism and thrombosis of unspecified deep veins of lower extremity, bilateral: Secondary | ICD-10-CM

## 2017-06-21 DIAGNOSIS — I2601 Septic pulmonary embolism with acute cor pulmonale: Secondary | ICD-10-CM

## 2017-06-21 DIAGNOSIS — D508 Other iron deficiency anemias: Secondary | ICD-10-CM

## 2017-06-21 DIAGNOSIS — C3491 Malignant neoplasm of unspecified part of right bronchus or lung: Secondary | ICD-10-CM | POA: Diagnosis present

## 2017-06-21 DIAGNOSIS — C3490 Malignant neoplasm of unspecified part of unspecified bronchus or lung: Secondary | ICD-10-CM

## 2017-06-21 DIAGNOSIS — D500 Iron deficiency anemia secondary to blood loss (chronic): Secondary | ICD-10-CM

## 2017-06-21 DIAGNOSIS — Z5112 Encounter for antineoplastic immunotherapy: Secondary | ICD-10-CM | POA: Insufficient documentation

## 2017-06-21 LAB — CMP (CANCER CENTER ONLY)
ALBUMIN: 2.7 g/dL — AB (ref 3.5–5.0)
ALT: 26 U/L (ref 10–47)
ANION GAP: 11 (ref 5–15)
AST: 28 U/L (ref 11–38)
Alkaline Phosphatase: 222 U/L — ABNORMAL HIGH (ref 26–84)
BILIRUBIN TOTAL: 0.7 mg/dL (ref 0.2–1.6)
BUN: 19 mg/dL (ref 7–22)
CHLORIDE: 99 mmol/L (ref 98–108)
CO2: 27 mmol/L (ref 18–33)
Calcium: 8.9 mg/dL (ref 8.0–10.3)
Creatinine: 0.7 mg/dL (ref 0.60–1.20)
Glucose, Bld: 250 mg/dL — ABNORMAL HIGH (ref 73–118)
Potassium: 4.3 mmol/L (ref 3.3–4.7)
Sodium: 137 mmol/L (ref 128–145)
Total Protein: 6.9 g/dL (ref 6.4–8.1)

## 2017-06-21 LAB — CBC WITH DIFFERENTIAL (CANCER CENTER ONLY)
Basophils Absolute: 0 10*3/uL (ref 0.0–0.1)
Basophils Relative: 0 %
Eosinophils Absolute: 0 10*3/uL (ref 0.0–0.5)
Eosinophils Relative: 0 %
HCT: 37.4 % (ref 34.8–46.6)
HEMOGLOBIN: 12.5 g/dL (ref 11.6–15.9)
Lymphocytes Relative: 2 %
Lymphs Abs: 0.3 10*3/uL — ABNORMAL LOW (ref 0.9–3.3)
MCH: 29.1 pg (ref 26.0–34.0)
MCHC: 33.4 g/dL (ref 32.0–36.0)
MCV: 87.2 fL (ref 81.0–101.0)
MONOS PCT: 5 %
Monocytes Absolute: 1.1 10*3/uL — ABNORMAL HIGH (ref 0.1–0.9)
NEUTROS PCT: 93 %
Neutro Abs: 19.3 10*3/uL — ABNORMAL HIGH (ref 1.5–6.5)
Platelet Count: 52 10*3/uL — ABNORMAL LOW (ref 145–400)
RBC: 4.29 MIL/uL (ref 3.70–5.32)
RDW: 16.2 % — AB (ref 11.1–15.7)
WBC Count: 20.7 10*3/uL — ABNORMAL HIGH (ref 3.9–10.0)

## 2017-06-21 LAB — IRON AND TIBC
Iron: 93 ug/dL (ref 41–142)
Saturation Ratios: 45 % (ref 21–57)
TIBC: 204 ug/dL — ABNORMAL LOW (ref 236–444)
UIBC: 111 ug/dL

## 2017-06-21 LAB — SAMPLE TO BLOOD BANK

## 2017-06-21 LAB — FERRITIN: FERRITIN: 2158 ng/mL — AB (ref 9–269)

## 2017-06-21 LAB — VITAMIN B12: VITAMIN B 12: 2610 pg/mL — AB (ref 180–914)

## 2017-06-21 MED ORDER — RAMELTEON 8 MG PO TABS: 8.0000 mg | ORAL_TABLET | Freq: Every day | ORAL | 0 refills | Status: DC

## 2017-06-21 MED ORDER — FOLIC ACID 1 MG PO TABS: 1.0000 mg | ORAL_TABLET | Freq: Every day | ORAL | 3 refills | Status: DC

## 2017-06-21 MED ORDER — PALONOSETRON HCL INJECTION 0.25 MG/5ML
INTRAVENOUS | Status: AC
  Filled 2017-06-21: qty 5

## 2017-06-21 MED ORDER — DENOSUMAB 120 MG/1.7ML ~~LOC~~ SOLN
SUBCUTANEOUS | Status: AC
Start: 2017-06-21 — End: ?
  Filled 2017-06-21: qty 1.7

## 2017-06-21 MED ORDER — SODIUM CHLORIDE 0.9 % IV SOLN
Freq: Once | INTRAVENOUS | Status: AC
  Administered 2017-06-21: 14:00:00 via INTRAVENOUS
  Filled 2017-06-21: qty 5

## 2017-06-21 MED ORDER — SODIUM CHLORIDE 0.9% FLUSH
10.0000 mL | INTRAVENOUS | Status: DC | PRN
  Administered 2017-06-21: 10 mL
  Filled 2017-06-21: qty 10

## 2017-06-21 MED ORDER — RAMELTEON 8 MG PO TABS: 8.0000 mg | ORAL_TABLET | Freq: Every evening | ORAL | 0 refills | Status: AC | PRN

## 2017-06-21 MED ORDER — HEPARIN SOD (PORK) LOCK FLUSH 100 UNIT/ML IV SOLN
500.0000 [IU] | Freq: Once | INTRAVENOUS | Status: AC | PRN
  Administered 2017-06-21: 500 [IU]
  Filled 2017-06-21: qty 5

## 2017-06-21 MED ORDER — PROCHLORPERAZINE MALEATE 10 MG PO TABS: 10.0000 mg | ORAL_TABLET | Freq: Four times a day (QID) | ORAL | 0 refills | Status: DC | PRN

## 2017-06-21 MED ORDER — PALONOSETRON HCL INJECTION 0.25 MG/5ML
0.2500 mg | Freq: Once | INTRAVENOUS | Status: AC
  Administered 2017-06-21: 0.25 mg via INTRAVENOUS

## 2017-06-21 MED ORDER — SODIUM CHLORIDE 0.9 % IV SOLN
Freq: Once | INTRAVENOUS | Status: AC
  Administered 2017-06-21: 14:00:00 via INTRAVENOUS

## 2017-06-21 MED ORDER — DENOSUMAB 120 MG/1.7ML ~~LOC~~ SOLN
120.0000 mg | Freq: Once | SUBCUTANEOUS | Status: AC
  Administered 2017-06-21: 120 mg via SUBCUTANEOUS

## 2017-06-21 MED ORDER — ONDANSETRON HCL 8 MG PO TABS: ORAL_TABLET | ORAL | 4 refills | Status: AC

## 2017-06-21 MED ORDER — SODIUM CHLORIDE 0.9 % IV SOLN
390.0000 mg/m2 | Freq: Once | INTRAVENOUS | Status: AC
  Administered 2017-06-21: 700 mg via INTRAVENOUS
  Filled 2017-06-21: qty 20

## 2017-06-21 MED ORDER — SODIUM CHLORIDE 0.9 % IV SOLN
428.8000 mg | Freq: Once | INTRAVENOUS | Status: AC
  Administered 2017-06-21: 430 mg via INTRAVENOUS
  Filled 2017-06-21: qty 43

## 2017-06-21 MED ORDER — LIDOCAINE-PRILOCAINE 2.5-2.5 % EX CREA: 1.0000 "application " | TOPICAL_CREAM | CUTANEOUS | 6 refills | Status: DC | PRN

## 2017-06-21 MED ORDER — APIXABAN 5 MG PO TABS: 5.0000 mg | ORAL_TABLET | Freq: Two times a day (BID) | ORAL | 3 refills | Status: DC

## 2017-06-21 MED ORDER — SODIUM CHLORIDE 0.9 % IV SOLN
200.0000 mg | Freq: Once | INTRAVENOUS | Status: AC
  Administered 2017-06-21: 200 mg via INTRAVENOUS
  Filled 2017-06-21: qty 8

## 2017-06-21 MED FILL — LIDOCAINE-PRILOCAINE CREAM: 2.5-2.5 | 30 days supply | Qty: 30 | Fill #0

## 2017-06-21 MED FILL — ROZEREM 8 MG TABLET: 8 | 30 days supply | Qty: 30 | Fill #0

## 2017-06-21 MED FILL — FOLIC ACID 1 MG TABS: 1 | 90 days supply | Qty: 90 | Fill #0

## 2017-06-21 MED FILL — ONDANSETRON HCL 8 MG TAB: 8 | 20 days supply | Qty: 60 | Fill #0

## 2017-06-21 MED FILL — PROCHLORPERAZINE 10 MG TAB: 10 | 7 days supply | Qty: 30 | Fill #0

## 2017-06-21 NOTE — Progress Notes (Signed)
Hematology and Oncology Follow Up Visit  [name] [id number] May 09, 1955 62 y.o. 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 She was diagnosed with bilateral lower extremity DVTs and PE of the right lower lobe. She is doing well on Eliquis and will continue 5 mg PO BID.  She feels that her SOB is stable. She is currently on 2 L [place] 24 hours a day.  She is bruising easily but has had no episodes of bleeding. Platelet count is 52, Hgb 12.5 and WBC count 20.7.   She has had some trouble sleeping.  No fever, chills, n/v, dizziness, chest pain, palpitations, abdominal pain or changes in bowel or bladder habits.  She has a mepilex on her coccyx covering irritation caused by radiation. She had a "fever blister" break out on both buttocks she states due to stress. She took Valtrex as needed. All vesicles have dried up and are healing nicely. No new sores.  No swelling, tenderness, numbness or tingling in her extremities at this time.  She has a good appetite and is staying well hydrated. Her weight is down 14 lbs.    ECOG Performance Status: 1 - Symptomatic but completely ambulatory  Medications:  Allergies as of 06/21/2017      Reactions   Ciprofloxacin Nausea Only      Medication List        Accurate as of 06/21/17 11:39 AM. Always use your most recent med list.          apixaban 5 MG Tabs tablet Commonly known as:  ELIQUIS Take 2 tablets  (10 mg total) by mouth daily. After taking 2 tablets (10mg ) for 4 days   apixaban 5 MG Tabs tablet Commonly known as:  ELIQUIS Take 1 tablet (5 mg total) by mouth daily. Start after finishing taking 2 tablets per day.   CALCIUM 600+D PO Take 1 tablet by mouth daily.   dexamethasone 6 MG tablet Commonly known as:  DECADRON Take 3 tablets (18 mg total) by mouth daily.   diphenoxylate-atropine 2.5-0.025 MG tablet Commonly known as:  LOMOTIL Take 2 tablets by mouth 4 (four) times daily as needed for diarrhea or loose stools.   docusate sodium 250 MG capsule Commonly known as:  COLACE Take 1 capsule (250 mg total) by mouth daily. While taking pain medication   EQ NICOTINE 14 mg/24hr patch Generic drug:  nicotine Place 14 mg onto the skin daily.   feeding supplement (ENSURE ENLIVE) Liqd Take 237 mLs by mouth 2 (two) times daily between meals.   fentaNYL 75 MCG/HR Commonly known as:  DURAGESIC - dosed mcg/hr Place 1 patch (75 mcg total) onto the skin every 3 (three) days.   furosemide 20 MG tablet Commonly known as:  LASIX Take 1 tablet (20 mg total) by mouth daily.   gabapentin 600 MG tablet Commonly known as:  NEURONTIN Take 0.5 tablets (300 mg total) by mouth 2 (two) times daily.   HYDROmorphone 2 MG tablet Commonly known as:  DILAUDID Take 1 tablet (2  mg total) by mouth every 4 (four) hours as needed (breakthrough pain).   ibuprofen 200 MG tablet Commonly known as:  ADVIL,MOTRIN Take 200 mg by mouth every 6 (six) hours as needed for moderate pain.   ipratropium-albuterol 0.5-2.5 (3) MG/3ML Soln Commonly known as:  DUONEB Take 3 mLs by nebulization every 6 (six) hours as needed.   LORazepam 1 MG tablet Commonly known as:  ATIVAN Take 1 tablet (1 mg total) by mouth 3 (three) times daily as needed. for anxiety   mirtazapine 7.5 MG tablet Commonly known as:  REMERON Take 1 tablet (7.5 mg total) by mouth at bedtime.   oxyCODONE 20 mg 12 hr tablet Commonly known  as:  OXYCONTIN Take 1 tablet (20 mg total) by mouth every 12 (twelve) hours.   pantoprazole 40 MG tablet Commonly known as:  PROTONIX Take 1 tablet (40 mg total) by mouth daily.   prochlorperazine 10 MG tablet Commonly known as:  COMPAZINE Take 1 tablet (10 mg total) by mouth every 6 (six) hours as needed for nausea or vomiting.   ramelteon 8 MG tablet Commonly known as:  ROZEREM Take 1 tablet (8 mg total) by mouth at bedtime.   valACYclovir 1000 MG tablet Commonly known as:  VALTREX Take 1 tablet (1,000 mg total) by mouth daily as needed (fever blisters).       Allergies:  Allergies  Allergen Reactions  . Ciprofloxacin Nausea Only    Past Medical History, Surgical history, Social history, and Family History were reviewed and updated.  Review of Systems: All other 10 point review of systems is negative.   Physical Exam:  vitals were not taken for this visit.   Wt Readings from Last 3 Encounters:  06/17/17 141 lb 9.6 oz (64.2 kg)  06/09/17 155 lb (70.3 kg)  06/01/17 157 lb 6 oz (71.4 kg)    Ocular: Sclerae unicteric, pupils equal, round and reactive to light Ear-nose-throat: Oropharynx clear, dentition fair Lymphatic: No cervical, supraclavicular or axillary adenopathy Lungs no rales or rhonchi, good excursion bilaterally Heart regular rate and rhythm, no murmur appreciated Abd soft, nontender, positive bowel sounds, no liver or spleen tip palpated on exam, no fluid wave  MSK no focal spinal tenderness, no joint edema Neuro: non-focal, well-oriented, appropriate affect Breasts: Deferred   Lab Results  Component Value Date   WBC 14.6 (H) 06/17/2017   HGB 11.7 (L) 06/17/2017   HCT 35.8 (L) 06/17/2017   MCV 86.9 06/17/2017   PLT 81 (L) 06/17/2017   Lab Results  Component Value Date   FERRITIN 296 06/14/2017   IRON 23 (L) 06/14/2017   TIBC 235 (L) 06/14/2017   UIBC 212 06/14/2017   IRONPCTSAT 10 (L) 06/14/2017   Lab Results  Component Value Date   RBC  4.12 06/17/2017   Lab Results  Component Value Date   KPAFRELGTCHN 26.2 (H) 05/10/2017   LAMBDASER 22.7 05/10/2017   KAPLAMBRATIO 1.15 05/10/2017   Lab Results  Component Value Date   IGGSERUM 1,215 05/10/2017   IGA 509 (H) 05/10/2017   IGMSERUM 63 05/10/2017   Lab Results  Component Value Date   TOTALPROTELP 6.8 05/10/2017   ALBUMINELP 2.9 05/10/2017   A1GS 0.4 05/10/2017   A2GS 1.1 (H) 05/10/2017   BETS 1.4 (H) 05/10/2017   GAMS 1.0 05/10/2017   MSPIKE Not Observed 05/10/2017     Chemistry      Component Value Date/Time   NA 132 (L) 06/16/2017 0442   K 4.4 06/16/2017 0442  CL 97 (L) 06/16/2017 0442   CO2 25 06/16/2017 0442   BUN 12 06/16/2017 0442   CREATININE 0.54 06/16/2017 0442   CREATININE 0.66 06/07/2017 1339   CREATININE 0.63 09/21/2014 1206      Component Value Date/Time   CALCIUM 8.6 (L) 06/16/2017 0442   ALKPHOS 154 (H) 06/01/2017 1304   AST 17 06/01/2017 1304   ALT 9 06/01/2017 1304   BILITOT 0.5 06/01/2017 1304       Impression and Plan: [name] is a very pleasant 62 yo caucasian female with metastatic adenocarcinoma of the lung with bone metastasis. She completed 10 fractions of radiation to the Pelvis.  She has also developed iron deficiency as well as bilateral lower extremity DVT's and PE of the right lower lung lobe.  She is doing well on Eliquis and will continue 5 mg PO daily. She verbalized understanding that she is not to take NSAIDS.  We will proceed with cycle 1 of Carbo/Alimta/Pembrolizumab as planned per Dr. Marin Olp.  I moved her Eliquis and Rozerem prescriptions to the pharmacy downstairs per her request. She states that she had no yet picked up the Rozerem. She will try this for sleep as needed.  She has her current scheduled and we will plan to see her again on 07/05/17.  She will contact our office with any questions or concerns. We can certainly see her sooner if need be.   Laverna Peace, NP 3/4/201911:39 AM

## 2017-06-21 NOTE — Patient Instructions (Signed)
[facility] Discharge Instructions for Patients Receiving Chemotherapy  Today you received the following chemotherapy agents Keytruda, Alimta, Carboplatin  To help prevent nausea and vomiting after your treatment, we encourage you to take your nausea medication  Take Ativan at home for nausea as directed. Take Compazine every 6 hours as needed for nausea    If you develop nausea and vomiting that is not controlled by your nausea medication, call the clinic.   BELOW ARE SYMPTOMS THAT SHOULD BE REPORTED IMMEDIATELY:  *FEVER GREATER THAN 100.5 F  *CHILLS WITH OR WITHOUT FEVER  NAUSEA AND VOMITING THAT IS NOT CONTROLLED WITH YOUR NAUSEA MEDICATION  *UNUSUAL SHORTNESS OF BREATH  *UNUSUAL BRUISING OR BLEEDING  TENDERNESS IN MOUTH AND THROAT WITH OR WITHOUT PRESENCE OF ULCERS  *URINARY PROBLEMS  *BOWEL PROBLEMS  UNUSUAL RASH Items with * indicate a potential emergency and should be followed up as soon as possible.  Feel free to call the clinic should you have any questions or concerns. The clinic phone number is [phone].  Please show the [facility] at check-in to the Emergency Department and triage nurse.

## 2017-06-21 NOTE — Patient Instructions (Signed)
Implanted Port Insertion, Care After °This sheet gives you information about how to care for yourself after your procedure. Your health care provider may also give you more specific instructions. If you have problems or questions, contact your health care provider. °What can I expect after the procedure? °After your procedure, it is common to have: °· Discomfort at the port insertion site. °· Bruising on the skin over the port. This should improve over 3-4 days. ° °Follow these instructions at home: °Port care °· After your port is placed, you will get a manufacturer's information card. The card has information about your port. Keep this card with you at all times. °· Take care of the port as told by your health care provider. Ask your health care provider if you or a family member can get training for taking care of the port at home. A home health care nurse may also take care of the port. °· Make sure to remember what type of port you have. °Incision care °· Follow instructions from your health care provider about how to take care of your port insertion site. Make sure you: °? Wash your hands with soap and water before you change your bandage (dressing). If soap and water are not available, use hand sanitizer. °? Change your dressing as told by your health care provider. °? Leave stitches (sutures), skin glue, or adhesive strips in place. These skin closures may need to stay in place for 2 weeks or longer. If adhesive strip edges start to loosen and curl up, you may trim the loose edges. Do not remove adhesive strips completely unless your health care provider tells you to do that. °· Check your port insertion site every day for signs of infection. Check for: °? More redness, swelling, or pain. °? More fluid or blood. °? Warmth. °? Pus or a bad smell. °General instructions °· Do not take baths, swim, or use a hot tub until your health care provider approves. °· Do not lift anything that is heavier than 10 lb (4.5  kg) for a week, or as told by your health care provider. °· Ask your health care provider when it is okay to: °? Return to work or school. °? Resume usual physical activities or sports. °· Do not drive for 24 hours if you were given a medicine to help you relax (sedative). °· Take over-the-counter and prescription medicines only as told by your health care provider. °· Wear a medical alert bracelet in case of an emergency. This will tell any health care providers that you have a port. °· Keep all follow-up visits as told by your health care provider. This is important. °Contact a health care provider if: °· You cannot flush your port with saline as directed, or you cannot draw blood from the port. °· You have a fever or chills. °· You have more redness, swelling, or pain around your port insertion site. °· You have more fluid or blood coming from your port insertion site. °· Your port insertion site feels warm to the touch. °· You have pus or a bad smell coming from the port insertion site. °Get help right away if: °· You have chest pain or shortness of breath. °· You have bleeding from your port that you cannot control. °Summary °· Take care of the port as told by your health care provider. °· Change your dressing as told by your health care provider. °· Keep all follow-up visits as told by your health care provider. °  This information is not intended to replace advice given to you by your health care provider. Make sure you discuss any questions you have with your health care provider. °Document Released: 01/25/2013 Document Revised: 02/26/2016 Document Reviewed: 02/26/2016 °Elsevier Interactive Patient Education © 2017 [employer] ° °

## 2017-06-21 NOTE — Progress Notes (Signed)
Ok to treat today with platelets of 52,000 per Dr. Marin Olp.

## 2017-06-22 ENCOUNTER — Encounter: Payer: Self-pay | Admitting: Hematology & Oncology

## 2017-06-24 ENCOUNTER — Other Ambulatory Visit: Payer: Self-pay | Admitting: *Deleted

## 2017-06-24 MED ORDER — FUROSEMIDE 20 MG PO TABS: 20.0000 mg | ORAL_TABLET | Freq: Every day | ORAL | 0 refills | Status: DC

## 2017-06-24 MED ORDER — HYDROMORPHONE HCL 2 MG PO TABS: 2.0000 mg | ORAL_TABLET | ORAL | 0 refills | Status: DC | PRN

## 2017-06-25 ENCOUNTER — Telehealth: Payer: Self-pay | Admitting: *Deleted

## 2017-06-25 NOTE — Telephone Encounter (Signed)
Patient c/o frequent nose bleeds over the last 3-4 days. She states this is outside her norm. They last only a few minutes but occur frequently throughout the day. She's on oxygen per nasal cannula, and has tried saline nasal spray in addition to coating her nostrils with lubricant like Vaseline. She states neither are effective.   Spoke to Dr Marin Olp. He would like patient to be seen with an urgent referral to Dr Redmond Baseman.  Called Dr Redmond Baseman office and patient can be seen at 1pm today. Information including time, location and office phone number given to patient. She understands that she needs to be there at 1pm.

## 2017-06-26 ENCOUNTER — Encounter (HOSPITAL_COMMUNITY): Payer: Self-pay | Admitting: Emergency Medicine

## 2017-06-26 ENCOUNTER — Inpatient Hospital Stay (HOSPITAL_COMMUNITY)
Admission: EM | Admit: 2017-06-26 | Discharge: 2017-07-05 | DRG: 813 | Disposition: A | Discharge status: Hospice | Payer: BLUE CROSS/BLUE SHIELD | Attending: Family Medicine | Admitting: Family Medicine

## 2017-06-26 DIAGNOSIS — Z720 Tobacco use: Secondary | ICD-10-CM

## 2017-06-26 DIAGNOSIS — Z6823 Body mass index (BMI) 23.0-23.9, adult: Secondary | ICD-10-CM

## 2017-06-26 DIAGNOSIS — K068 Other specified disorders of gingiva and edentulous alveolar ridge: Secondary | ICD-10-CM | POA: Diagnosis not present

## 2017-06-26 DIAGNOSIS — E872 Acidosis: Secondary | ICD-10-CM | POA: Diagnosis not present

## 2017-06-26 DIAGNOSIS — T50905A Adverse effect of unspecified drugs, medicaments and biological substances, initial encounter: Secondary | ICD-10-CM | POA: Diagnosis not present

## 2017-06-26 DIAGNOSIS — M5489 Other dorsalgia: Secondary | ICD-10-CM

## 2017-06-26 DIAGNOSIS — R531 Weakness: Secondary | ICD-10-CM | POA: Diagnosis not present

## 2017-06-26 DIAGNOSIS — C801 Malignant (primary) neoplasm, unspecified: Secondary | ICD-10-CM | POA: Diagnosis not present

## 2017-06-26 DIAGNOSIS — F4322 Adjustment disorder with anxiety: Secondary | ICD-10-CM | POA: Diagnosis present

## 2017-06-26 DIAGNOSIS — D61818 Other pancytopenia: Secondary | ICD-10-CM | POA: Diagnosis present

## 2017-06-26 DIAGNOSIS — F17210 Nicotine dependence, cigarettes, uncomplicated: Secondary | ICD-10-CM | POA: Diagnosis present

## 2017-06-26 DIAGNOSIS — J80 Acute respiratory distress syndrome: Secondary | ICD-10-CM | POA: Diagnosis not present

## 2017-06-26 DIAGNOSIS — I959 Hypotension, unspecified: Secondary | ICD-10-CM | POA: Diagnosis not present

## 2017-06-26 DIAGNOSIS — R109 Unspecified abdominal pain: Secondary | ICD-10-CM | POA: Diagnosis not present

## 2017-06-26 DIAGNOSIS — J9601 Acute respiratory failure with hypoxia: Secondary | ICD-10-CM | POA: Diagnosis not present

## 2017-06-26 DIAGNOSIS — R7881 Bacteremia: Secondary | ICD-10-CM | POA: Diagnosis not present

## 2017-06-26 DIAGNOSIS — Z515 Encounter for palliative care: Secondary | ICD-10-CM

## 2017-06-26 DIAGNOSIS — F1010 Alcohol abuse, uncomplicated: Secondary | ICD-10-CM | POA: Diagnosis present

## 2017-06-26 DIAGNOSIS — J189 Pneumonia, unspecified organism: Secondary | ICD-10-CM

## 2017-06-26 DIAGNOSIS — C7989 Secondary malignant neoplasm of other specified sites: Secondary | ICD-10-CM

## 2017-06-26 DIAGNOSIS — R0602 Shortness of breath: Secondary | ICD-10-CM

## 2017-06-26 DIAGNOSIS — R945 Abnormal results of liver function studies: Secondary | ICD-10-CM | POA: Diagnosis not present

## 2017-06-26 DIAGNOSIS — B965 Pseudomonas (aeruginosa) (mallei) (pseudomallei) as the cause of diseases classified elsewhere: Secondary | ICD-10-CM | POA: Diagnosis not present

## 2017-06-26 DIAGNOSIS — Z881 Allergy status to other antibiotic agents status: Secondary | ICD-10-CM | POA: Diagnosis not present

## 2017-06-26 DIAGNOSIS — R739 Hyperglycemia, unspecified: Secondary | ICD-10-CM | POA: Diagnosis present

## 2017-06-26 DIAGNOSIS — D6959 Other secondary thrombocytopenia: Secondary | ICD-10-CM | POA: Diagnosis present

## 2017-06-26 DIAGNOSIS — J8489 Other specified interstitial pulmonary diseases: Secondary | ICD-10-CM | POA: Diagnosis not present

## 2017-06-26 DIAGNOSIS — A419 Sepsis, unspecified organism: Secondary | ICD-10-CM

## 2017-06-26 DIAGNOSIS — R040 Epistaxis: Secondary | ICD-10-CM

## 2017-06-26 DIAGNOSIS — N320 Bladder-neck obstruction: Secondary | ICD-10-CM | POA: Diagnosis not present

## 2017-06-26 DIAGNOSIS — Z9221 Personal history of antineoplastic chemotherapy: Secondary | ICD-10-CM

## 2017-06-26 DIAGNOSIS — A4152 Sepsis due to Pseudomonas: Secondary | ICD-10-CM | POA: Diagnosis not present

## 2017-06-26 DIAGNOSIS — Z66 Do not resuscitate: Secondary | ICD-10-CM | POA: Diagnosis not present

## 2017-06-26 DIAGNOSIS — Z923 Personal history of irradiation: Secondary | ICD-10-CM

## 2017-06-26 DIAGNOSIS — F411 Generalized anxiety disorder: Secondary | ICD-10-CM | POA: Diagnosis present

## 2017-06-26 DIAGNOSIS — R197 Diarrhea, unspecified: Secondary | ICD-10-CM | POA: Diagnosis not present

## 2017-06-26 DIAGNOSIS — C3491 Malignant neoplasm of unspecified part of right bronchus or lung: Secondary | ICD-10-CM | POA: Diagnosis present

## 2017-06-26 DIAGNOSIS — Z7901 Long term (current) use of anticoagulants: Secondary | ICD-10-CM | POA: Diagnosis not present

## 2017-06-26 DIAGNOSIS — R000 Tachycardia, unspecified: Secondary | ICD-10-CM | POA: Diagnosis not present

## 2017-06-26 DIAGNOSIS — R918 Other nonspecific abnormal finding of lung field: Secondary | ICD-10-CM

## 2017-06-26 DIAGNOSIS — Z8711 Personal history of peptic ulcer disease: Secondary | ICD-10-CM

## 2017-06-26 DIAGNOSIS — R0989 Other specified symptoms and signs involving the circulatory and respiratory systems: Secondary | ICD-10-CM | POA: Diagnosis not present

## 2017-06-26 DIAGNOSIS — R7989 Other specified abnormal findings of blood chemistry: Secondary | ICD-10-CM

## 2017-06-26 DIAGNOSIS — R509 Fever, unspecified: Secondary | ICD-10-CM | POA: Diagnosis not present

## 2017-06-26 DIAGNOSIS — M7989 Other specified soft tissue disorders: Secondary | ICD-10-CM

## 2017-06-26 DIAGNOSIS — C7951 Secondary malignant neoplasm of bone: Secondary | ICD-10-CM | POA: Diagnosis present

## 2017-06-26 DIAGNOSIS — K861 Other chronic pancreatitis: Secondary | ICD-10-CM | POA: Diagnosis present

## 2017-06-26 DIAGNOSIS — E43 Unspecified severe protein-calorie malnutrition: Secondary | ICD-10-CM

## 2017-06-26 DIAGNOSIS — J811 Chronic pulmonary edema: Secondary | ICD-10-CM | POA: Diagnosis present

## 2017-06-26 DIAGNOSIS — J449 Chronic obstructive pulmonary disease, unspecified: Secondary | ICD-10-CM | POA: Diagnosis present

## 2017-06-26 DIAGNOSIS — C3490 Malignant neoplasm of unspecified part of unspecified bronchus or lung: Secondary | ICD-10-CM

## 2017-06-26 DIAGNOSIS — R05 Cough: Secondary | ICD-10-CM | POA: Diagnosis not present

## 2017-06-26 DIAGNOSIS — E876 Hypokalemia: Secondary | ICD-10-CM | POA: Diagnosis present

## 2017-06-26 DIAGNOSIS — R233 Spontaneous ecchymoses: Secondary | ICD-10-CM | POA: Diagnosis not present

## 2017-06-26 DIAGNOSIS — Z7189 Other specified counseling: Secondary | ICD-10-CM

## 2017-06-26 DIAGNOSIS — T451X5A Adverse effect of antineoplastic and immunosuppressive drugs, initial encounter: Secondary | ICD-10-CM | POA: Diagnosis present

## 2017-06-26 DIAGNOSIS — E861 Hypovolemia: Secondary | ICD-10-CM | POA: Diagnosis present

## 2017-06-26 DIAGNOSIS — E46 Unspecified protein-calorie malnutrition: Secondary | ICD-10-CM | POA: Diagnosis not present

## 2017-06-26 DIAGNOSIS — E871 Hypo-osmolality and hyponatremia: Secondary | ICD-10-CM | POA: Diagnosis present

## 2017-06-26 DIAGNOSIS — N319 Neuromuscular dysfunction of bladder, unspecified: Secondary | ICD-10-CM | POA: Diagnosis not present

## 2017-06-26 DIAGNOSIS — Z86718 Personal history of other venous thrombosis and embolism: Secondary | ICD-10-CM

## 2017-06-26 DIAGNOSIS — R6521 Severe sepsis with septic shock: Secondary | ICD-10-CM | POA: Diagnosis not present

## 2017-06-26 DIAGNOSIS — Z9981 Dependence on supplemental oxygen: Secondary | ICD-10-CM | POA: Diagnosis not present

## 2017-06-26 DIAGNOSIS — J9600 Acute respiratory failure, unspecified whether with hypoxia or hypercapnia: Secondary | ICD-10-CM | POA: Diagnosis not present

## 2017-06-26 DIAGNOSIS — Z7952 Long term (current) use of systemic steroids: Secondary | ICD-10-CM

## 2017-06-26 DIAGNOSIS — Z86711 Personal history of pulmonary embolism: Secondary | ICD-10-CM

## 2017-06-26 DIAGNOSIS — D696 Thrombocytopenia, unspecified: Secondary | ICD-10-CM | POA: Diagnosis present

## 2017-06-26 DIAGNOSIS — N318 Other neuromuscular dysfunction of bladder: Secondary | ICD-10-CM | POA: Diagnosis not present

## 2017-06-26 DIAGNOSIS — I824Z3 Acute embolism and thrombosis of unspecified deep veins of distal lower extremity, bilateral: Secondary | ICD-10-CM | POA: Diagnosis not present

## 2017-06-26 DIAGNOSIS — R079 Chest pain, unspecified: Secondary | ICD-10-CM | POA: Diagnosis not present

## 2017-06-26 LAB — CBC WITH DIFFERENTIAL/PLATELET
BASOS ABS: 0 10*3/uL (ref 0.0–0.1)
BASOS PCT: 0 %
Basophils Absolute: 0 10*3/uL (ref 0.0–0.1)
Basophils Relative: 0 %
EOS ABS: 0 10*3/uL (ref 0.0–0.7)
EOS PCT: 2 %
Eosinophils Absolute: 0.1 10*3/uL (ref 0.0–0.7)
Eosinophils Relative: 0 %
HCT: 33.4 % — ABNORMAL LOW (ref 36.0–46.0)
HCT: 31.3 % — ABNORMAL LOW (ref 36.0–46.0)
HEMOGLOBIN: 11.1 g/dL — AB (ref 12.0–15.0)
HEMOGLOBIN: 10.3 g/dL — AB (ref 12.0–15.0)
LYMPHS PCT: 4 %
LYMPHS PCT: 4 %
Lymphs Abs: 0.2 10*3/uL — ABNORMAL LOW (ref 0.7–4.0)
Lymphs Abs: 0.1 10*3/uL — ABNORMAL LOW (ref 0.7–4.0)
MCH: 28.6 pg (ref 26.0–34.0)
MCH: 28.1 pg (ref 26.0–34.0)
MCHC: 33.2 g/dL (ref 30.0–36.0)
MCHC: 32.9 g/dL (ref 30.0–36.0)
MCV: 86.1 fL (ref 78.0–100.0)
MCV: 85.3 fL (ref 78.0–100.0)
MONO ABS: 0.1 10*3/uL (ref 0.1–1.0)
MONOS PCT: 3 %
Monocytes Absolute: 0.2 10*3/uL (ref 0.1–1.0)
Monocytes Relative: 4 %
NEUTROS ABS: 3.9 10*3/uL (ref 1.7–7.7)
NEUTROS PCT: 92 %
NEUTROS PCT: 91 %
Neutro Abs: 3.1 10*3/uL (ref 1.7–7.7)
PLATELETS: 16 10*3/uL — AB (ref 150–400)
Platelets: 80 10*3/uL — ABNORMAL LOW (ref 150–400)
RBC: 3.88 MIL/uL (ref 3.87–5.11)
RBC: 3.67 MIL/uL — ABNORMAL LOW (ref 3.87–5.11)
RDW: 16.7 % — ABNORMAL HIGH (ref 11.5–15.5)
RDW: 16.3 % — ABNORMAL HIGH (ref 11.5–15.5)
WBC: 4.3 10*3/uL (ref 4.0–10.5)
WBC: 3.4 10*3/uL — ABNORMAL LOW (ref 4.0–10.5)

## 2017-06-26 LAB — BASIC METABOLIC PANEL
Anion gap: 11 (ref 5–15)
BUN: 15 mg/dL (ref 6–20)
CHLORIDE: 92 mmol/L — AB (ref 101–111)
CO2: 23 mmol/L (ref 22–32)
CREATININE: 0.57 mg/dL (ref 0.44–1.00)
Calcium: 6.6 mg/dL — ABNORMAL LOW (ref 8.9–10.3)
GFR calc Af Amer: >60 mL/min (ref >60)
Glucose, Bld: 318 mg/dL — ABNORMAL HIGH (ref 65–99)
Potassium: 4.4 mmol/L (ref 3.5–5.1)
SODIUM: 126 mmol/L — AB (ref 135–145)

## 2017-06-26 LAB — APTT: aPTT: 30 seconds (ref 24–36)

## 2017-06-26 LAB — PROTIME-INR
INR: 1.27
PROTHROMBIN TIME: 15.8 s — AB (ref 11.4–15.2)

## 2017-06-26 MED ORDER — LIDOCAINE 5 % EX PTCH
1.0000 | MEDICATED_PATCH | CUTANEOUS | Status: DC
  Administered 2017-06-26 – 2017-07-03 (×8): 1 via TRANSDERMAL
  Filled 2017-06-26 (×8): qty 1

## 2017-06-26 MED ORDER — RAMELTEON 8 MG PO TABS
8.0000 mg | ORAL_TABLET | Freq: Every evening | ORAL | Status: DC | PRN
  Administered 2017-06-27: 8 mg via ORAL
  Filled 2017-06-26: qty 1

## 2017-06-26 MED ORDER — LORAZEPAM 2 MG/ML IJ SOLN
1.0000 mg | Freq: Once | INTRAMUSCULAR | Status: AC
  Administered 2017-06-26: 1 mg via INTRAVENOUS
  Filled 2017-06-26: qty 1

## 2017-06-26 MED ORDER — LORAZEPAM 1 MG PO TABS
1.0000 mg | ORAL_TABLET | Freq: Four times a day (QID) | ORAL | Status: DC | PRN
  Administered 2017-06-26 – 2017-07-03 (×12): 1 mg via ORAL
  Filled 2017-06-26 (×12): qty 1

## 2017-06-26 MED ORDER — OXYCODONE HCL ER 20 MG PO T12A
20.0000 mg | EXTENDED_RELEASE_TABLET | Freq: Two times a day (BID) | ORAL | Status: DC
  Administered 2017-06-26 – 2017-06-27 (×4): 20 mg via ORAL
  Filled 2017-06-26: qty 2
  Filled 2017-06-26: qty 1
  Filled 2017-06-26: qty 2
  Filled 2017-06-26: qty 1
  Filled 2017-06-26: qty 2

## 2017-06-26 MED ORDER — NICOTINE 14 MG/24HR TD PT24
14.0000 mg | MEDICATED_PATCH | Freq: Every day | TRANSDERMAL | Status: DC
  Administered 2017-06-26 – 2017-07-05 (×9): 14 mg via TRANSDERMAL
  Filled 2017-06-26 (×9): qty 1

## 2017-06-26 MED ORDER — FOLIC ACID 1 MG PO TABS
1.0000 mg | ORAL_TABLET | Freq: Every day | ORAL | Status: DC
  Administered 2017-06-26 – 2017-07-03 (×8): 1 mg via ORAL
  Filled 2017-06-26 (×8): qty 1

## 2017-06-26 MED ORDER — OXYMETAZOLINE HCL 0.05 % NA SOLN: 1.0000 | Freq: Once | NASAL | Status: DC

## 2017-06-26 MED ORDER — FENTANYL 75 MCG/HR TD PT72
75.0000 ug | MEDICATED_PATCH | TRANSDERMAL | Status: DC
  Administered 2017-06-27: 75 ug via TRANSDERMAL
  Filled 2017-06-26: qty 3

## 2017-06-26 MED ORDER — FENTANYL 25 MCG/HR TD PT72: 75.0000 ug | MEDICATED_PATCH | TRANSDERMAL | Status: DC

## 2017-06-26 MED ORDER — POLYETHYLENE GLYCOL 3350 17 G PO PACK
17.0000 g | PACK | Freq: Every day | ORAL | Status: DC
  Administered 2017-06-26 – 2017-06-27 (×2): 17 g via ORAL
  Filled 2017-06-26 (×2): qty 1

## 2017-06-26 MED ORDER — GUAIFENESIN-DM 100-10 MG/5ML PO SYRP
5.0000 mL | ORAL_SOLUTION | ORAL | Status: DC | PRN
  Administered 2017-06-26 – 2017-06-28 (×2): 5 mL via ORAL
  Filled 2017-06-26 (×2): qty 5

## 2017-06-26 MED ORDER — SODIUM CHLORIDE 0.9% FLUSH
10.0000 mL | Freq: Two times a day (BID) | INTRAVENOUS | Status: DC
  Administered 2017-06-27 – 2017-07-03 (×7): 10 mL

## 2017-06-26 MED ORDER — PANTOPRAZOLE SODIUM 40 MG PO TBEC
40.0000 mg | DELAYED_RELEASE_TABLET | Freq: Every day | ORAL | Status: DC
  Administered 2017-06-26 – 2017-07-05 (×9): 40 mg via ORAL
  Filled 2017-06-26 (×9): qty 1

## 2017-06-26 MED ORDER — APIXABAN 2.5 MG PO TABS
2.5000 mg | ORAL_TABLET | Freq: Two times a day (BID) | ORAL | Status: DC
  Administered 2017-06-26 – 2017-06-30 (×10): 2.5 mg via ORAL
  Filled 2017-06-26 (×10): qty 1

## 2017-06-26 MED ORDER — ACETAMINOPHEN 650 MG RE SUPP: 650.0000 mg | Freq: Four times a day (QID) | RECTAL | Status: DC | PRN

## 2017-06-26 MED ORDER — SODIUM CHLORIDE 0.9 % IV SOLN
Freq: Once | INTRAVENOUS | Status: AC
  Administered 2017-06-26: 13:00:00 via INTRAVENOUS

## 2017-06-26 MED ORDER — TRANEXAMIC ACID 1000 MG/10ML IV SOLN
500.0000 mg | Freq: Once | INTRAVENOUS | Status: AC
  Administered 2017-06-26: 500 mg via TOPICAL
  Filled 2017-06-26: qty 10

## 2017-06-26 MED ORDER — APIXABAN 5 MG PO TABS
5.0000 mg | ORAL_TABLET | Freq: Two times a day (BID) | ORAL | Status: DC
  Filled 2017-06-26: qty 1

## 2017-06-26 MED ORDER — MIRTAZAPINE 7.5 MG PO TABS
7.5000 mg | ORAL_TABLET | Freq: Every day | ORAL | Status: DC
  Administered 2017-06-26 – 2017-07-03 (×8): 7.5 mg via ORAL
  Filled 2017-06-26 (×10): qty 1

## 2017-06-26 MED ORDER — ONDANSETRON HCL 4 MG/2ML IJ SOLN
4.0000 mg | Freq: Four times a day (QID) | INTRAMUSCULAR | Status: DC | PRN
  Administered 2017-06-26 – 2017-07-04 (×4): 4 mg via INTRAVENOUS
  Filled 2017-06-26 (×4): qty 2

## 2017-06-26 MED ORDER — ENSURE ENLIVE PO LIQD
237.0000 mL | Freq: Two times a day (BID) | ORAL | Status: DC
  Administered 2017-06-26 – 2017-06-28 (×4): 237 mL via ORAL

## 2017-06-26 MED ORDER — LIDOCAINE VISCOUS 2 % MT SOLN
15.0000 mL | Freq: Once | OROMUCOSAL | Status: AC
  Administered 2017-06-26: 15 mL via OROMUCOSAL
  Filled 2017-06-26: qty 15

## 2017-06-26 MED ORDER — GABAPENTIN 600 MG PO TABS
300.0000 mg | ORAL_TABLET | Freq: Two times a day (BID) | ORAL | Status: DC
  Administered 2017-06-26 – 2017-06-28 (×4): 300 mg via ORAL
  Filled 2017-06-26 (×5): qty 1

## 2017-06-26 MED ORDER — FUROSEMIDE 20 MG PO TABS
20.0000 mg | ORAL_TABLET | Freq: Every day | ORAL | Status: DC
  Administered 2017-06-26: 20 mg via ORAL
  Filled 2017-06-26: qty 1

## 2017-06-26 MED ORDER — HYDROMORPHONE HCL 2 MG PO TABS
2.0000 mg | ORAL_TABLET | ORAL | Status: DC | PRN
  Administered 2017-06-26 – 2017-06-30 (×15): 2 mg via ORAL
  Filled 2017-06-26 (×15): qty 1

## 2017-06-26 MED ORDER — IPRATROPIUM-ALBUTEROL 0.5-2.5 (3) MG/3ML IN SOLN
3.0000 mL | Freq: Four times a day (QID) | RESPIRATORY_TRACT | Status: DC | PRN
  Administered 2017-06-28 – 2017-07-03 (×6): 3 mL via RESPIRATORY_TRACT
  Filled 2017-06-26 (×6): qty 3

## 2017-06-26 MED ORDER — ACETAMINOPHEN 325 MG PO TABS
650.0000 mg | ORAL_TABLET | Freq: Four times a day (QID) | ORAL | Status: DC | PRN
  Administered 2017-06-27 – 2017-07-02 (×2): 650 mg via ORAL
  Filled 2017-06-26 (×4): qty 2

## 2017-06-26 MED ORDER — PROCHLORPERAZINE EDISYLATE 5 MG/ML IJ SOLN
5.0000 mg | Freq: Four times a day (QID) | INTRAMUSCULAR | Status: DC | PRN
  Administered 2017-06-26 – 2017-06-28 (×2): 5 mg via INTRAVENOUS
  Filled 2017-06-26: qty 2
  Filled 2017-06-26 (×2): qty 1

## 2017-06-26 MED ORDER — SODIUM CHLORIDE 0.9 % IV SOLN
Freq: Once | INTRAVENOUS | Status: AC
  Administered 2017-06-26: 1000 mL via INTRAVENOUS

## 2017-06-26 NOTE — ED Provider Notes (Signed)
Emergency Department Provider Note   I have reviewed the triage vital signs and the nursing notes.   HISTORY  Chief Complaint Epistaxis   HPI [name] is a 62 y.o. female with PMH of stage IV lung cancer currently on chemo who was recently diagnosed with PE and started on Eliquis presents to the emergency department for evaluation of near constant nosebleed for the last 24-36 hours.  Patient states she has had a slow trickle of blood from the right nostril mainly.  At times becomes more brisk.  She has felt more lightheaded but denies any CP or dyspnea. No prior history of nosebleeds. Patient also with new dark rash on the legs.    Past Medical History:  Diagnosis Date  . Abdominal pain 06/02/2017  . Abdominal pain, right upper quadrant 04/03/2016  . Acute dyspnea   . Alcohol abuse    H/o withdrawal   . Alcohol withdrawal ([place]) 10/02/2013  . Anxiety   . Cancer [name])    unknown primary. known bone mets.  . Chronic diarrhea    hx of  . Constipation   . Depression   . Elevated liver function tests few yrs ago  . Fatty liver 10/11/2011  . FATTY LIVER DISEASE 10/01/2008   Qualifier: Diagnosis of  By: Nelson-Smith CMA (AAMA), Dottie    . Gastric ulcer 01/20/2012  . HIATAL HERNIA 10/01/2008   Qualifier: Diagnosis of  By: Nelson-Smith CMA (AAMA), Dottie    . Incisional hernia s/p lap/open repair with mesh 07/09/2016 07/09/2016  . Major depressive disorder, recurrent, severe without psychotic features ([place])   . Metastatic lung cancer (metastasis from lung to other site), right ([place]) 06/01/2017  . Pancreatic calcification 04/03/2016  . Pancreatic pain 03/31/2017  . Pancreatitis   . PONV (postoperative nausea and vomiting)   . 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 Rosacea   . Sciatica of right side 02/25/2017  . Wears glasses     Patient Active Problem List   Diagnosis Date Noted  . Cancer associated pain   . Palliative care by specialist   . Pulmonary embolism ([place])  06/13/2017  . Metastatic primary lung cancer, unspecified laterality ([place]) 06/01/2017  . Adenocarcinoma of unknown primary ([place]) 05/13/2017  . Osseous metastasis ([place]) 05/13/2017  . Tobacco use disorder 02/25/2017  . Chronic pancreatitis ([place]) 04/18/2016  . Counseling regarding goals of care 04/03/2016  . Substance induced mood disorder ([place]) 11/06/2014  . Bilateral knee pain 10/14/2014  . Alcohol use disorder, severe, dependence ([place]) 08/17/2014  . Suicidal behavior   . Alcohol dependence ([place]) 10/02/2013  . Major depression, recurrent ([place]) 10/02/2013  . Elevated liver function tests   . GERD (gastroesophageal reflux disease)   . Anxiety state 10/01/2008  . 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 CHOLECYSTECTOMY    . COLONOSCOPY  Never  . DIAGNOSTIC MAMMOGRAM  2008  . ERCP  10/06/2011   Procedure: ENDOSCOPIC RETROGRADE CHOLANGIOPANCREATOGRAPHY (ERCP);  Surgeon: Beryle Beams, MD;  Location: [facility];  Service: Endoscopy;  Laterality: N/A;  . ESOPHAGOGASTRODUODENOSCOPY  03/21/2011   Procedure: ESOPHAGOGASTRODUODENOSCOPY (EGD);  Surgeon: Scarlette Shorts, MD;  Location[facility] ENDOSCOPY;  Service: Endoscopy;  Laterality: N/A;  Patient may need to be done at bedside tomorrow depending on status  . ESOPHAGOGASTRODUODENOSCOPY  01/06/2012   Procedure: ESOPHAGOGASTRODUODENOSCOPY (EGD);  Surgeon: Wonda Horner, MD;  Location: [facility] ENDOSCOPY;  Service: Endoscopy;  Laterality: N/A;  bedside  . HERNIA REPAIR    .  INSERTION OF MESH  07/09/2016   Procedure: INSERTION OF MESH;  Surgeon: Michael Boston, MD;  Location: [facility] ORS;  Service: General;;  . IR FLUORO GUIDE PORT INSERTION RIGHT  06/09/2017  . IR US GUIDE VASC ACCESS RIGHT  06/09/2017  . LAPAROTOMY  01/03/2012   Procedure: EXPLORATORY LAPAROTOMY;  Surgeon: Pedro Earls, MD;  Location: [facility] ORS;  Service: General;  Laterality: N/A;  debridement of necrotic pancreas and placement of drains x2  .  pancreatic cyst removed   2015  . SHOULDER SURGERY Left 02/18/2010   clavical repair  . TUBAL LIGATION    . VENTRAL HERNIA REPAIR N/A 07/09/2016   Procedure: LAPAROSCOPIC LYSIS OF ADHESIONS  VENTRAL WALL HERNIA REPAIR WITH MESH EXCISION OF HERNIA [place];  Surgeon: Michael Boston, MD;  Location[facility]S;  Service: General;  Laterality: N/A;    Current Outpatient Rx  . Order #: [id number] Class: Normal  . Order #: [id number] Class: Historical Med  . Order #: [id number] Class: Normal  . Order #: [id number] Class: Normal  . Order #: [id number] Class: Historical Med  . Order #: [id number] Class: Normal  . Order #: [id number] Class: Normal  . Order #: [id number] Class: Normal  . Order #: [id number] Class: Normal  . Order #: [id number] Class: Normal  . Order #: [id number] Class: Normal  . Order #: [id number] Class: Historical Med  . Order #: [id number] Class: Normal  . Order #: [id number] Class: Normal  . Order #: [id number] Class: Normal  . Order #: [id number] Class: Normal  . Order #: [id number] Class: Normal  . Order #: [id number] Class: Normal  . Order #: [id number] Class: Normal  . Order #: [id number] Class: Normal  . Order #: [id number] Class: Normal  . Order #: [id number] Class: Normal  . Order #: [id number] Class: Print  . Order #: [id number] Class: No Print    Allergies Ciprofloxacin  Family History  Problem Relation Age of Onset  . Stroke Mother   . Heart disease Father   . Heart failure Father   . Heart disease Sister     Social History Social History   Tobacco Use  . Smoking status: Heavy Tobacco Smoker    Packs/day: 1.00    Years: 15.00    Pack years: 15.00    Types: Cigarettes  . Smokeless tobacco: Never Used  . Tobacco comment: smoking a pack per day now  Substance Use Topics  . Alcohol use: Yes    Alcohol/week: 1.5 - 2.0 oz    Types: 3 - 4 Standard drinks or equivalent per week    Comment: quit 2016 - 2 drinks per week   . Drug use: Yes    Types: Marijuana    Comment: occasional    Review of  Systems  Constitutional: No fever/chills. Positive lightheadedness and weakness.  Eyes: No visual changes. ENT: No sore throat. Positive nosebleed.  Cardiovascular: Denies chest pain. Respiratory: Denies shortness of breath. Gastrointestinal: No abdominal pain.  No nausea, no vomiting.  No diarrhea.  No constipation. Genitourinary: Negative for dysuria. Musculoskeletal: Negative for back pain. Skin: Negative for rash. Neurological: Negative for headaches, focal weakness or numbness.  10-point ROS otherwise negative.  ____________________________________________   PHYSICAL EXAM:  VITAL SIGNS: ED Triage Vitals  Enc Vitals Group     BP 06/26/17 0300 123/62     Pulse Rate 06/26/17 0300 99     SpO2 06/26/17 0300 95 %     Weight 06/26/17 0243 145 lb (65.8 kg)     Height 06/26/17 0243 5\' 6"  (1.676 m)   Constitutional: Alert and oriented. Well appearing and in no acute  distress. Eyes: Conjunctivae are slightly pale.  Head: Atraumatic. Nose: No congestion/rhinnorhea. Diffuse oozing along the septum of right nostril. No pulsatile area. No brisk bleeding.  Mouth/Throat: Mucous membranes are moist.  Oropharynx non-erythematous. Neck: No stridor.   Cardiovascular: Normal rate, regular rhythm. Good peripheral circulation. Grossly normal heart sounds.   Respiratory: Normal respiratory effort.  No retractions. Lungs CTAB. Gastrointestinal: Soft and nontender. No distention.  Musculoskeletal: No lower extremity tenderness nor edema. No gross deformities of extremities. Neurologic:  Normal speech and language. No gross focal neurologic deficits are appreciated.  Skin:  Skin is warm, dry and intact. Petechial rash to the legs.   ____________________________________________   LABS (all labs ordered are listed, but only abnormal results are displayed)  Labs Reviewed  CBC WITH DIFFERENTIAL/PLATELET - Abnormal; Notable for the following components:      Result Value   Hemoglobin 11.1 (*)      HCT 33.4 (*)    RDW 16.7 (*)    Platelets 16 (*)    Lymphs Abs 0.2 (*)    All other components within normal limits  BASIC METABOLIC PANEL - Abnormal; Notable for the following components:   Sodium 126 (*)    Chloride 92 (*)    Glucose, Bld 318 (*)    Calcium 6.6 (*)    All other components within normal limits  PROTIME-INR - Abnormal; Notable for the following components:   Prothrombin Time 15.8 (*)    All other components within normal limits   ____________________________________________  RADIOLOGY  None ____________________________________________   PROCEDURES  Procedure(s) performed:   .Epistaxis Management Date/Time: 06/26/2017 6:55 AM Performed by: Margette Fast, MD Authorized by: Margette Fast, MD   Consent:    Consent obtained:  Verbal   Consent given by:  Patient   Risks discussed:  Infection, bleeding, nasal injury and pain   Alternatives discussed:  No treatment Anesthesia (see MAR for exact dosages):    Anesthesia method:  Topical application   Topical anesthetic:  Lidocaine gel Procedure details:    Treatment site:  R anterior   Repair method: TXA soaked pleget.   Treatment complexity:  Limited   Treatment episode: recurring   Post-procedure details:    Assessment:  Bleeding stopped   Patient tolerance of procedure:  Tolerated well, no immediate complications     ____________________________________________   INITIAL IMPRESSION / ASSESSMENT AND PLAN / ED COURSE  Pertinent labs & imaging results that were available during my care of the patient were reviewed by me and considered in my medical decision making (see chart for details).  Patient presents to the emergency department for evaluation of oozing nosebleed that has been mostly constant for the past 2 days.  She is experiencing some lightheadedness and petechial rash on the legs.  Patient recently started on Eliquis after developing pulmonary embolism in the setting of stage IV lung  cancer, currently on chemotherapy.  Afebrile.  The patient's nosebleed seems to be as a result of diffuse oozing rather than focal defect.  Epistaxis was controlled as per procedure note above.  Patient is maintaining her airway.  Labs show normal hemoglobin but platelets downtrending sharply now at 16.  Given her sharp drop in platelets along with active bleeding and patient being currently on Eliquis I plan for admission for hematology consult and further monitoring.   Discussed patient's case with Family Medicine to request admission. Patient and family (if present) updated with plan. Care transferred to [facility] service.  I reviewed all  nursing notes, vitals, pertinent old records, EKGs, labs, imaging (as available).  ____________________________________________  FINAL CLINICAL IMPRESSION(S) / ED DIAGNOSES  Final diagnoses:  Thrombocytopenia (HCC)  Anterior epistaxis     MEDICATIONS GIVEN DURING THIS VISIT:  Medications  oxymetazoline (AFRIN) 0.05 % nasal spray 1 spray (not administered)  tranexamic acid (CYKLOKAPRON) injection 500 mg (500 mg Topical Given 06/26/17 0353)  lidocaine (XYLOCAINE) 2 % viscous mouth solution 15 mL (15 mLs Mouth/Throat Given 06/26/17 0354)    Note:  This document was prepared using Dragon voice recognition software and may include unintentional dictation errors.  Nanda Quinton, MD Emergency Medicine    Meoshia Billing, [facility], MD 06/26/17 [phone]

## 2017-06-26 NOTE — Consult Note (Signed)
Referral MD  Reason for Referral: Thrombocytopenia secondary to chemotherapy and likely marrow infiltration from malignancy  Chief Complaint  Patient presents with  . Epistaxis  : My nose would not stop bleeding.  HPI: Ms. Senat is well-known to me.  She is a very nice 62 year old white female.  She has a history of metastatic adenocarcinoma of the lung.  She has pulmonary embolism and lower extremity DVT.  She is on Eliquis.  She got her first cycle of chemotherapy last week.  She received carboplatinum/Alimta/pembrolizumab.  She tolerated this pretty well.  Unfortunately she began having some nosebleeds on Thursday.  We try to get her to ENT.  She cannot make it to ENT.  She had up in the emergency room.  She had thrombocytopenia.  Her white cell count is 4.3.  Hemoglobin 11.1.  Platelet count 16,000.  She was admitted.  She has hyperglycemia.  Her blood sugar was 318.  She had an INR of 1.27.  I was asked to see her since I knew her.  She actually looks pretty good.  She is not having as much back pain.  She had received radiation therapy to the back.  She is had some swelling in her legs.  I suspect this likely is from past steroid use.  She is had no fever.  She is had no hematuria.  There is been no melena or bright red blood per rectum.  She is had no mouth sores.  Her appetite is marginal.  Overall, her performance status is ECOG 1.     Past Medical History:  Diagnosis Date  . Abdominal pain 06/02/2017  . Abdominal pain, right upper quadrant 04/03/2016  . Acute dyspnea   . Alcohol abuse    H/o withdrawal   . Alcohol withdrawal ([place]) 10/02/2013  . Anxiety   . Cancer [facility])    unknown primary. known bone mets.  . Chronic diarrhea    hx of  . Constipation   . Depression   . Elevated liver function tests few yrs ago  . Fatty liver 10/11/2011  . FATTY LIVER DISEASE 10/01/2008   Qualifier: Diagnosis of  By: Nelson-Smith CMA (AAMA), Dottie    . Gastric ulcer  01/20/2012  . HIATAL HERNIA 10/01/2008   Qualifier: Diagnosis of  By: Nelson-Smith CMA (AAMA), Dottie    . Incisional hernia s/p lap/open repair with mesh 07/09/2016 07/09/2016  . Major depressive disorder, recurrent, severe without psychotic features ([place])   . Metastatic lung cancer (metastasis from lung to other site), right ([place]) 06/01/2017  . Pancreatic calcification 04/03/2016  . Pancreatic pain 03/31/2017  . Pancreatitis   . PONV (postoperative nausea and vomiting)   . 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 Rosacea   . Sciatica of right side 02/25/2017  . Wears glasses   :  Past Surgical History:  Procedure Laterality Date  . CHOLECYSTECTOMY    . COLONOSCOPY  Never  . DIAGNOSTIC MAMMOGRAM  2008  . ERCP  10/06/2011   Procedure: ENDOSCOPIC RETROGRADE CHOLANGIOPANCREATOGRAPHY (ERCP);  Surgeon: Beryle Beams, MD;  Location: [facility];  Service: Endoscopy;  Laterality: N/A;  . ESOPHAGOGASTRODUODENOSCOPY  03/21/2011   Procedure: ESOPHAGOGASTRODUODENOSCOPY (EGD);  Surgeon: Scarlette Shorts, MD;  Location: [facility] ENDOSCOPY;  Service: Endoscopy;  Laterality: N/A;  Patient may need to be done at bedside tomorrow depending on status  . ESOPHAGOGASTRODUODENOSCOPY  01/06/2012   Procedure: ESOPHAGOGASTRODUODENOSCOPY (EGD);  Surgeon: Wonda Horner, MD;  Location: [facility];  Service: Endoscopy;  Laterality: N/A;  bedside  .  HERNIA REPAIR    . INSERTION OF MESH  07/09/2016   Procedure: INSERTION OF MESH;  Surgeon: Michael Boston, MD;  [facility];  Service: General;;  . IR FLUORO GUIDE PORT INSERTION RIGHT  06/09/2017  . IR US GUIDE VASC ACCESS RIGHT  06/09/2017  . LAPAROTOMY  01/03/2012   Procedure: EXPLORATORY LAPAROTOMY;  Surgeon: Pedro Earls, MD;  Location[facility];  Service: General;  Laterality: N/A;  debridement of necrotic pancreas and placement of drains x2  . pancreatic cyst removed   2015  . SHOULDER SURGERY Left 02/18/2010   clavical repair  . TUBAL LIGATION    . VENTRAL HERNIA REPAIR N/A  07/09/2016   Procedure: LAPAROSCOPIC LYSIS OF ADHESIONS  VENTRAL WALL HERNIA REPAIR WITH MESH EXCISION OF HERNIA [place];  Surgeon: Michael Boston, MD;  [facility];  Service: General;  Laterality: N/A;  :   Current Facility-Administered Medications:  .  0.9 %  sodium chloride infusion, , Intravenous, Once, Tammi Klippel, Sherin, DO .  acetaminophen (TYLENOL) tablet 650 mg, 650 mg, Oral, Q6H PRN **OR** acetaminophen (TYLENOL) suppository 650 mg, 650 mg, Rectal, Q6H PRN, Caroline More, DO .  apixaban (ELIQUIS) tablet 5 mg, 5 mg, Oral, BID, Abraham, Sherin, DO .  feeding supplement (ENSURE ENLIVE) (ENSURE ENLIVE) liquid 237 mL, 237 mL, Oral, BID BM, Abraham, Sherin, DO, 237 mL at 06/26/17 0955 .  [START ON 06/27/2017] fentaNYL (DURAGESIC - dosed mcg/hr) patch 75 mcg, 75 mcg, Transdermal, Q72H, Dickie La, MD .  folic acid (FOLVITE) tablet 1 mg, 1 mg, Oral, Daily, Tammi Klippel, Sherin, DO, 1 mg at 06/26/17 0953 .  furosemide (LASIX) tablet 20 mg, 20 mg, Oral, Daily, Tammi Klippel, Sherin, DO, 20 mg at 06/26/17 0955 .  gabapentin (NEURONTIN) tablet 300 mg, 300 mg, Oral, BID, Tammi Klippel, Sherin, DO, 300 mg at 06/26/17 0954 .  guaiFENesin-dextromethorphan (ROBITUSSIN DM) 100-10 MG/5ML syrup 5 mL, 5 mL, Oral, Q4H PRN, Dickie La, MD, 5 mL at 06/26/17 0953 .  HYDROmorphone (DILAUDID) tablet 2 mg, 2 mg, Oral, Q4H PRN, Caroline More, DO, 2 mg at 06/26/17 0853 .  ipratropium-albuterol (DUONEB) 0.5-2.5 (3) MG/3ML nebulizer solution 3 mL, 3 mL, Nebulization, Q6H PRN, Tammi Klippel, Sherin, DO .  lidocaine (LIDODERM) 5 % 1 patch, 1 patch, Transdermal, Q24H, Abraham, Sherin, DO, 1 patch at 06/26/17 0954 .  LORazepam (ATIVAN) tablet 1 mg, 1 mg, Oral, QID PRN, Caroline More, DO .  mirtazapine (REMERON) tablet 7.5 mg, 7.5 mg, Oral, QHS, Abraham, Sherin, DO .  nicotine (NICODERM CQ - dosed in mg/24 hours) patch 14 mg, 14 mg, Transdermal, Daily, Tammi Klippel, Sherin, DO, 14 mg at 06/26/17 0953 .  oxyCODONE (OXYCONTIN) 12 hr tablet 20 mg,  20 mg, Oral, Q12H, Abraham, Sherin, DO, 20 mg at 06/26/17 0953 .  pantoprazole (PROTONIX) EC tablet 40 mg, 40 mg, Oral, Daily, Tammi Klippel, Sherin, DO, 40 mg at 06/26/17 0954 .  polyethylene glycol (MIRALAX / GLYCOLAX) packet 17 g, 17 g, Oral, Daily, Abraham, Sherin, DO, 17 g at 06/26/17 0953 .  ramelteon (ROZEREM) tablet 8 mg, 8 mg, Oral, QHS PRN, Caroline More, DO:  . apixaban  5 mg Oral BID  . feeding supplement (ENSURE ENLIVE)  237 mL Oral BID BM  . [START ON 06/27/2017] fentaNYL  75 mcg Transdermal Q72H  . folic acid  1 mg Oral Daily  . furosemide  20 mg Oral Daily  . gabapentin  300 mg Oral BID  . lidocaine  1 patch Transdermal Q24H  . mirtazapine  7.5  mg Oral QHS  . nicotine  14 mg Transdermal Daily  . oxyCODONE  20 mg Oral Q12H  . pantoprazole  40 mg Oral Daily  . polyethylene glycol  17 g Oral Daily  :  Allergies  Allergen Reactions  . Ciprofloxacin Nausea Only  :  Family History  Problem Relation Age of Onset  . Stroke Mother   . Heart disease Father   . Heart failure Father   . Heart disease Sister   :  Social History   Socioeconomic History  . Marital status: Divorced    Spouse name: Not on file  . Number of children: Not on file  . Years of education: Not on file  . Highest education level: Not on file  Social Needs  . Financial resource strain: Not on file  . Food insecurity - worry: Not on file  . Food insecurity - inability: Not on file  . Transportation needs - medical: Not on file  . Transportation needs - non-medical: Not on file  Occupational History  . Not on file  Tobacco Use  . Smoking status: Heavy Tobacco Smoker    Packs/day: 1.00    Years: 15.00    Pack years: 15.00    Types: Cigarettes  . Smokeless tobacco: Never Used  . Tobacco comment: smoking a pack per day now  Substance and Sexual Activity  . Alcohol use: Yes    Alcohol/week: 1.5 - 2.0 oz    Types: 3 - 4 Standard drinks or equivalent per week    Comment: quit 2016 - 2 drinks per  week   . Drug use: Yes    Types: Marijuana    Comment: occasional  . 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  Other Topics Concern  . Not on file  Social History Narrative   Has a son and some friends with whom she is close. Was in rehab in 09/2010 and was sober for a few months after.   :  Pertinent items are noted in HPI.  Exam: Patient Vitals for the past 24 hrs:  BP Temp Temp src Pulse Resp SpO2 Height Weight  06/26/17 0816 (!) 126/54 97.9 F (36.6 C) Oral 94 18 99 % 5\' 6"  (1.676 m) 146 lb 2.6 oz (66.3 kg)  06/26/17 0600 (!) 145/80 - - 96 - 97 % - -  06/26/17 0530 134/61 - - 93 - 95 % - -  06/26/17 0330 127/66 - - 95 - 94 % - -  06/26/17 0300 123/62 - - 99 - 95 % - -  06/26/17 0243 - - - - - - 5\' 6"  (1.676 m) 145 lb (65.8 kg)     Recent Labs    06/26/17 0357  WBC 4.3  HGB 11.1*  HCT 33.4*  PLT 16*   Recent Labs    06/26/17 0357  NA 126*  K 4.4  CL 92*  CO2 23  GLUCOSE 318*  BUN 15  CREATININE 0.57  CALCIUM 6.6*    Blood smear review: None  Pathology: None    Assessment and Plan: [name] is a very charming 62 year old white female.  She has metastatic adenocarcinoma of the lung.  She had chemotherapy a week ago.  I am little surprised that her platelet count dropped so quickly.  Again, I suspect that she has marrow infiltration by malignancy.  I clearly would transfuse her.  I think that would be very helpful.  I  will check her PTT.  She is on Eliquis.  She has to be on Eliquis.  I probably would just put her on half dose of Eliquis at 2.5 mg twice a day right now.  I still think that she needs to see ENT as an outpatient.  I suspect that she probably has a superficial vessel that opened up with irritation of the nasal mucosa.  As always, it is nice to see Ms. [place].  She is very nice.  She has had a really tough time for the past month.  I appreciate the wonderful care that she is getting from  the staff up on [facility].  Lattie Haw, MD  Romans 5:3-5

## 2017-06-26 NOTE — ED Notes (Signed)
ED Provider at bedside. 

## 2017-06-26 NOTE — ED Triage Notes (Signed)
Reports nose bleed for the last two days.  Patient is a cancer pt on chemo and blood thinners due to PE last week.

## 2017-06-26 NOTE — Evaluation (Signed)
Physical Therapy Evaluation Patient Details Name: [name] MRN: [id number] DOB: 10-26-1955 Today's Date: 06/26/2017   History of Present Illness  Pt is a 62 y/o female admitted secondary to nosebleeds and presenting with thrombocytopenia . 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    Clinical Impression  Pt presented supine in bed with HOB elevated, awake and willing to participate in therapy session. Prior to admission, pt reported that she was independent with all functional mobility and ADLs. Pt currently requires supervision for transfers and min guard for ambulation without use of an AD. Pt limited secondary to fatigue and generalized weakness. Pt would continue to benefit from skilled physical therapy services at this time while admitted and after d/c to address the below listed limitations in order to improve overall safety and independence with functional mobility.     Follow Up Recommendations Home health PT;Supervision - Intermittent    Equipment Recommendations  None recommended by PT    Recommendations for Other Services       Precautions / Restrictions Precautions Precautions: None Restrictions Weight Bearing Restrictions: No      Mobility  Bed Mobility Overal bed mobility: Modified Independent                Transfers Overall transfer level: Needs assistance Equipment used: None Transfers: Sit to/from Stand Sit to Stand: Supervision         General transfer comment: supervision for safety  Ambulation/Gait Ambulation/Gait assistance: Min guard Ambulation Distance (Feet): 75 Feet Assistive device: None Gait Pattern/deviations: Step-through pattern;Decreased stride length Gait velocity: Decreased Gait  velocity interpretation: Below normal speed for age/gender General Gait Details: slow, cautious, steady gait without use of an AD; no overt LOB or need for physical assistance  Stairs            Wheelchair Mobility    Modified Rankin (Stroke Patients Only)       Balance Overall balance assessment: Needs assistance Sitting-balance support: Feet supported Sitting balance-Leahy Scale: Good     Standing balance support: During functional activity;No upper extremity supported Standing balance-Leahy Scale: Fair                               Pertinent Vitals/Pain Pain Assessment: No/denies pain    Home Living Family/patient expects to be discharged to:: Private residence Living Arrangements: Spouse/significant other Available Help at Discharge: Family;Available 24 hours/day Type of Home: House Home Access: Stairs to enter   [employer] of Steps: 2 Home Layout: One level Home Equipment: None      Prior Function Level of Independence: Independent               Hand Dominance        Extremity/Trunk Assessment   Upper Extremity Assessment Upper Extremity Assessment: Overall WFL for tasks assessed    Lower Extremity Assessment Lower Extremity Assessment: Overall WFL for tasks assessed    Cervical / Trunk Assessment Cervical / Trunk Assessment: Normal  Communication   Communication: No difficulties  Cognition Arousal/Alertness: Awake/alert Behavior During Therapy: WFL for tasks assessed/performed Overall Cognitive Status: Within Functional Limits for tasks assessed  General Comments      Exercises     Assessment/Plan    PT Assessment Patient needs continued PT services  PT Problem List Decreased activity tolerance;Decreased balance;Decreased mobility;Decreased coordination;Decreased safety awareness       PT Treatment Interventions DME instruction;Gait  training;Stair training;Functional mobility training;Therapeutic activities;Therapeutic exercise;Neuromuscular re-education;Balance training;Patient/family education    PT Goals (Current goals can be found in the Care Plan section)  Acute Rehab PT Goals Patient Stated Goal: return home PT Goal Formulation: With patient Time For Goal Achievement: 07/10/17 Potential to Achieve Goals: Good    Frequency Min 3X/week   Barriers to discharge        Co-evaluation               AM-PAC PT "6 Clicks" Daily Activity  Outcome Measure Difficulty turning over in bed (including adjusting bedclothes, sheets and blankets)?: None Difficulty moving from lying on back to sitting on the side of the bed? : None Difficulty sitting down on and standing up from a chair with arms (e.g., wheelchair, bedside commode, etc,.)?: A Little Help needed moving to and from a bed to chair (including a wheelchair)?: None Help needed walking in hospital room?: A Little Help needed climbing 3-5 steps with a railing? : A Little 6 Click Score: 21    End of Session Equipment Utilized During Treatment: Gait belt;Oxygen Activity Tolerance: Patient limited by fatigue Patient left: in bed;with call bell/phone within reach;with bed alarm set Nurse Communication: Mobility status PT Visit Diagnosis: Other abnormalities of gait and mobility (R26.89);Muscle weakness (generalized) (M62.81)    Time: 2595-6387 PT Time Calculation (min) (ACUTE ONLY): 13 min   Charges:   PT Evaluation $PT Eval Low Complexity: 1 Low     PT G Codes:        [place], PT, [place] 06/26/2017, 1:21 PM

## 2017-06-26 NOTE — ED Notes (Addendum)
Patient will be transported to [facility] after shift change;;Breakfast tray ordered in the ED-Monique,RN

## 2017-06-26 NOTE — H&P (Addendum)
[facility] Admission History and Physical Service Pager: [phone]  Patient name: [name] Medical record number: [id number] Date of birth: 05/24/1955 Age: 62 y.o. Gender: female  Primary Care Provider: Nuala Alpha, DO Consultants: plan to consult hematology  Code Status: Full   Chief Complaint: nose bleed x 2 days   Assessment and Plan: [name] is a 62 y.o. female presenting with thrombocytopenia . 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 Nose bleed was stopped in ED with topical TXA and lidocaine. Hgb in ED stable at 11.1, but h/o anemia. Platelets in ED decreased to 16. Recent history of thrombocytopenia with most recent platelet values of 81>52. Patient with recent initiation of chemotherapy after completion of radiation, giving likely etiology of thrombocytopenia. Patient with history of Stage IV Right lung cancer with bony metastasis giving possible etiology. Was advised by hematologist to follow up oin office on 3/8 @ 1pm but from chart review it appears as though patient did not make appointment. Patient sees alternative medicine doctor but states she has not taken any herbs or supplements.  -admit to med-surg, attending Dr. Nori Riis -continuous cardiac monitoring -vital signs + pulse ox q routine  -daily weights -monitor intake & output -will consult hematology; patient would likely benefit from platelet transfusion; also plan to discuss use of Eliquis with heme -blood smear pending  -repeat CBC @4pm    Recent PE with bilateral PE Recently admitted in February for PE in RLL involving segmental  and more peripehral branches and found to have bilateral DVTs. Hypercoagulable given h/o malignancy. Patient was treated with Eliquis and was instructed to continue Eliquis x 3 months.  -monitor for s/sx of PE -will likely resume Eliquis; patient has not yet missed a dose this hospitalization   Stage IV Right Lung Cancer Adenocarcinoma of unknown primary vs NSCLC with systemic bony metastatic disease. S/p 1 mo of radiation therapy (last ttx 2/21). She has also had a Port-A-Cath placed on right chest 2/20. Possible abdominal mets seen on CT abdomen on 2/13. No mets seen on brain MR 2/17. Receives 50-75 mcg of fentanyl via 72-hour patch and 20 oxycodone every 12 hours as needed, dilaudid 2mg  q4hrs prn. Chemotherapy initiated on 06/21/17 -Continue home pain regimen  -Continue with outpatient management -Bowel regimen Colace and MiraLAX -Wound care per nursing for Port-A-Cath incision -palliative care consultation, appears to have seen palliative care during previous admission and recommended for continued follow up   Systemic weakness, worsening Likely due to deconditioning in the setting of metastatic disease. -PT/OT -Nutrition consult  Chronic pancreatitis s/p biliary stenting, stable Likely due to alcohol abuse given patient's history per the chart.  Patient has a biliary stent on 11/30.  Has been endorsing abdominal pain however abdominal exam is unremarkable. Recent imaging on 2/13 CT abdominal pelvis did not show any acute findings consistent with pancreatitis.  -continue to monitor abdominal exam  Adjustment disorder with anxious features Acute. She did not have this before and it has been less than 3 months of symptoms according to the patient. Home meds: ativa 1mg  tid prn, mirtazapine 7.5mg  daily,  -continue home meds   Tobacco abuse -Nicotine replacement patches as needed -Nursing to provide smoking cessation counseling -Patient continues to  smoke while using supplemental O2 at  home, will need aggressive counseling on dangers of smoking with O2 use  FEN/GI: regular diet, protonix 40mg  daily  Prophylaxis: SCDs  Disposition: admit to med-surg, attending Dr. Nori Riis   History of Present Illness:  [name] is a 62 y.o. female presenting with nose bleeds. Reports she was recently diagnosed with lung cancer. Underwent radiation in December and had chemotherapy this past Tuesday. She was started on Eliquis due to PE/DVT. Nose bleed started about 48 hours ago with slow trickle that gradually got worse.  Called oncology office on 06/25/17 and was told to come in urgently and appointment for 1pm made, but per chart review did not see doctor yesterday.   Sees an alternative medicine doctor. Does not take any herbs or supplements. Nothing is prescribed by this physician.   In ED topical TXA and lidocaine were used and achieved successful hemostasis. CBC showed platelet level of 16, previously 81>52.   Review Of Systems: Per HPI with the following additions:    Review of Systems  HENT: Positive for nosebleeds.   Respiratory: Positive for cough. Negative for hemoptysis, sputum production and shortness of breath.   Cardiovascular: Negative for chest pain.  Gastrointestinal: Negative for blood in stool and melena.  Genitourinary: Negative for hematuria.  Skin: Positive for rash.  Endo/Heme/Allergies: Bruises/bleeds easily.    Patient Active Problem List   Diagnosis Date Noted  . Thrombocytopenia ([place]) 06/26/2017  . Cancer associated pain   . Palliative care by specialist   . Pulmonary embolism ([place]) 06/13/2017  . Metastatic primary lung cancer, unspecified laterality ([place]) 06/01/2017  . Adenocarcinoma of unknown primary ([place]) 05/13/2017  . Osseous metastasis ([place]) 05/13/2017  . Tobacco use disorder 02/25/2017  . Chronic pancreatitis ([place]) 04/18/2016  . Counseling regarding goals of care 04/03/2016  . Substance induced mood disorder ([place]) 11/06/2014  . Bilateral  knee pain 10/14/2014  . Alcohol use disorder, severe, dependence ([place]) 08/17/2014  . Suicidal behavior   . Alcohol dependence ([place]) 10/02/2013  . Major depression, recurrent ([place]) 10/02/2013  . Elevated liver function tests   . GERD (gastroesophageal reflux disease)   . Anxiety state 10/01/2008  . ESOPHAGEAL MOTILITY DISORDER 10/01/2008    Past Medical History: Past Medical History:  Diagnosis Date  . Abdominal pain 06/02/2017  . Abdominal pain, right upper quadrant 04/03/2016  . Acute dyspnea   . Alcohol abuse    H/o withdrawal   . Alcohol withdrawal ([place]) 10/02/2013  . Anxiety   . Cancer [name])    unknown primary. known bone mets.  . Chronic diarrhea    hx of  . Constipation   . Depression   . Elevated liver function tests few yrs ago  . Fatty liver 10/11/2011  . FATTY LIVER DISEASE 10/01/2008   Qualifier: Diagnosis of  By: Nelson-Smith CMA (AAMA), [place]ott[place]    . Gastric ulcer 01/20/2012  . HIATAL HERNIA 10/01/2008   Qualifier: Diagnosis of  By: Nelson-Smith CMA (AAMA), Dottie    . Incisional hernia s/p lap/open repair with mesh 07/09/2016 07/09/2016  . Major depressive disorder, recurrent, severe without psychotic features ([place])   . Metastatic lung cancer (metastasis from lung to other site), right ([place]) 06/01/2017  . Pancreatic calcification 04/03/2016  . Pancreatic pain 03/31/2017  . Pancreatitis   . PONV (postoperative nausea and vomiting)   . 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 Rosacea   . Sciatica of right side 02/25/2017  . Wears glasses     Past Surgical History:  Past Surgical History:  Procedure Laterality Date  . CHOLECYSTECTOMY    . COLONOSCOPY  Never  . DIAGNOSTIC MAMMOGRAM  2008  . ERCP  10/06/2011   Procedure: ENDOSCOPIC RETROGRADE CHOLANGIOPANCREATOGRAPHY (ERCP);  Surgeon: Beryle Beams, MD;  Location: [facility];  Service: Endoscopy;  Laterality: N/A;  . ESOPHAGOGASTRODUODENOSCOPY  03/21/2011   Procedure: ESOPHAGOGASTRODUODENOSCOPY (EGD);  Surgeon: Scarlette Shorts, MD;  Location: [facility] ENDOSCOPY;  Service: Endoscopy;  Laterality: N/A;  Patient may need to be done at bedside tomorrow depending on status  . ESOPHAGOGASTRODUODENOSCOPY  01/06/2012   Procedure: ESOPHAGOGASTRODUODENOSCOPY (EGD);  Surgeon: Wonda Horner, MD;  Location: [facility] ENDOSCOPY;  Service: Endoscopy;  Laterality: N/A;  bedside  . HERNIA REPAIR    . INSERTION OF MESH  07/09/2016   Procedure: INSERTION OF MESH;  Surgeon: Michael Boston, MD;  Location: [facility] ORS;  Service: General;;  . IR FLUORO GUIDE PORT INSERTION RIGHT  06/09/2017  . IR US GUIDE VASC ACCESS RIGHT  06/09/2017  . LAPAROTOMY  01/03/2012   Procedure: EXPLORATORY LAPAROTOMY;  Surgeon: Pedro Earls, MD;  Location: [facility] ORS;  Service: General;  Laterality: N/A;  debridement of necrotic pancreas and placement of drains x2  . pancreatic cyst removed   2015  . SHOULDER SURGERY Left 02/18/2010   clavical repair  . TUBAL LIGATION    . VENTRAL HERNIA REPAIR N/A 07/09/2016   Procedure: LAPAROSCOPIC LYSIS OF ADHESIONS  VENTRAL WALL HERNIA REPAIR WITH MESH EXCISION OF HERNIA [place];  Surgeon: Michael Boston, MD;  Location: [facility] ORS;  Service: General;  Laterality: N/A;    Social History: Social History   Tobacco Use  . Smoking status: Heavy Tobacco Smoker    Packs/day: 1.00    Years: 15.00    Pack years: 15.00    Types: Cigarettes  . Smokeless tobacco: Never Used  . Tobacco comment: smoking a pack per day now  Substance Use Topics  . Alcohol use: Yes    Alcohol/week: 1.5 - 2.0 oz    Types: 3 - 4 Standard drinks or equivalent per week    Comment: quit 2016 - 2 drinks per week   . Drug use: Yes    Types: Marijuana    Comment: occasional   Additional social history: smokes daily, denies alcohol and illicit drug use  Please also refer to relevant sections of EMR.  Family History: Family History  Problem Relation Age of Onset  . Stroke Mother   . Heart disease Father   . Heart failure Father   . Heart disease Sister     Allergies  and Medications: Allergies  Allergen Reactions  . Ciprofloxacin Nausea Only   No current facility-administered medications on file prior to encounter.    Current Outpatient Medications on File Prior to Encounter  Medication Sig Dispense Refill  . apixaban (ELIQUIS) 5 MG TABS tablet Take 1 tablet (5 mg total) by mouth 2 (two) times daily. 60 tablet 3  . Calcium Carbonate-Vitamin D (CALCIUM 600+D PO) Take 1 tablet by mouth daily.    Marland Kitchen dexamethasone (DECADRON) 6 MG tablet Take 3 tablets (18 mg total) by mouth daily. 90 tablet 0  . diphenoxylate-atropine (LOMOTIL) 2.5-0.025 MG tablet Take 2 tablets by mouth 4 (four) times daily as needed for diarrhea or loose stools. 30 tablet 0  . EQ NICOTINE 14 MG/24HR patch Place 14 mg onto the skin daily.   0  . feeding supplement, ENSURE ENLIVE, (ENSURE ENLIVE) LIQD Take 237 mLs by mouth 2 (two) times daily between  meals. 237 mL 12  . fentaNYL (DURAGESIC - DOSED MCG/HR) 75 MCG/HR Place 1 patch (75 mcg total) onto the skin every 3 (three) days. 5 patch 0  . folic acid (FOLVITE) 1 MG tablet Take 1 tablet (1 mg total) by mouth daily. 90 tablet 3  . furosemide (LASIX) 20 MG tablet Take 1 tablet (20 mg total) by mouth daily. 10 tablet 0  . gabapentin (NEURONTIN) 600 MG tablet Take 0.5 tablets (300 mg total) by mouth 2 (two) times daily. 60 tablet 0  . HYDROmorphone (DILAUDID) 2 MG tablet Take 1 tablet (2 mg total) by mouth every 4 (four) hours as needed (breakthrough pain). 60 tablet 0  . ibuprofen (ADVIL,MOTRIN) 200 MG tablet Take 200 mg by mouth every 6 (six) hours as needed for moderate pain.    Marland Kitchen ipratropium-albuterol (DUONEB) 0.5-2.5 (3) MG/3ML SOLN Take 3 mLs by nebulization every 6 (six) hours as needed. 360 mL 0  . lidocaine-prilocaine (EMLA) cream Apply 1 application topically as needed. Apply to port one hour before appointment 30 g 6  . LORazepam (ATIVAN) 1 MG tablet Take 1 tablet (1 mg total) by mouth 3 (three) times daily as needed. for anxiety  (Patient taking differently: Take 1 mg by mouth 4 (four) times daily. for anxiety) 90 tablet 0  . mirtazapine (REMERON) 7.5 MG tablet Take 1 tablet (7.5 mg total) by mouth at bedtime. 30 tablet 0  . ondansetron (ZOFRAN) 8 MG tablet Take one tablet twice daily for 3 days starting the day after chemo. Then take every 8hrs as needed for nausea/vomitting. 60 tablet 4  . oxyCODONE (OXYCONTIN) 20 mg 12 hr tablet Take 1 tablet (20 mg total) by mouth every 12 (twelve) hours. 14 tablet 0  . pantoprazole (PROTONIX) 40 MG tablet Take 1 tablet (40 mg total) by mouth daily. 30 tablet 0  . prochlorperazine (COMPAZINE) 10 MG tablet Take 1 tablet (10 mg total) by mouth every 6 (six) hours as needed for nausea or vomiting. 30 tablet 0  . ramelteon (ROZEREM) 8 MG tablet Take 1 tablet (8 mg total) by mouth at bedtime as needed for sleep. 30 tablet 0  . valACYclovir (VALTREX) 1000 MG tablet Take 1 tablet (1,000 mg total) by mouth daily as needed (fever blisters). 30 tablet 0  . docusate sodium (COLACE) 250 MG capsule Take 1 capsule (250 mg total) by mouth daily. While taking pain medication (Patient not taking: Reported on 06/13/2017) 30 capsule 0  . prochlorperazine (COMPAZINE) 10 MG tablet Take 1 tablet (10 mg total) by mouth every 6 (six) hours as needed for nausea or vomiting. (Patient not taking: Reported on 06/13/2017) 30 tablet 1    Objective: BP (!) 126/54 (BP Location: Right Arm)   Pulse 94   Temp 97.9 F (36.6 C) (Oral)   Resp 18   Ht 5\' 6"  (1.676 m)   Wt 146 lb 2.6 oz (66.3 kg)   LMP 12/08/2006   SpO2 99%   BMI 23.59 kg/m  Exam: General: awake and alert, laying in bed, tearful on exam, no supplemental O2 used  Eyes: PERRL ENTM: moist mucous membranes, no oral lesions noted, no active epistaxis  Cardiovascular: RRR, no MRG Respiratory: distant breath sounds bilaterally, no increased WOB Gastrointestinal: soft, diffuse tenderness, non distended, bowel sounds present  MSK: mild tenderness, no cords  palpated, 5/5 muscle strength, 2+ pulses bilaterally  Derm: rash along left low back/buttock that appear to have scabbed over or are open blisters, bleeding noted; large bruise on  right hand with several smaller areas of ecchymosis over upper extremities  Neuro: no focal deficits, sensation intact bilaterally  Psych: normal affect, tearful  Labs and Imaging: CBC BMET  Recent Labs  Lab 06/26/17 0357  WBC 4.3  HGB 11.1*  HCT 33.4*  PLT 16*   Recent Labs  Lab 06/26/17 0357  NA 126*  K 4.4  CL 92*  CO2 23  BUN 15  CREATININE 0.57  GLUCOSE 318*  CALCIUM 6.6*       Ref. Range 06/26/2017 03:57  Neutrophils Latest Units: % 92  Lymphocytes Latest Units: % 4  Monocytes Relative Latest Units: % 4  Eosinophil Latest Units: % 0  Basophil Latest Units: % 0  NEUT# Latest Ref Range: 1.7 - 7.7 K/uL 3.9  Lymphocyte # Latest Ref Range: 0.7 - 4.0 K/uL 0.2 (L)  Monocyte # Latest Ref Range: 0.1 - 1.0 K/uL 0.2  Eosinophils Absolute Latest Ref Range: 0.0 - 0.7 K/uL 0.0  Basophils Absolute Latest Ref Range: 0.0 - 0.1 K/uL 0.0  Smear Review Unknown MORPHOLOGY UNREMA...    Ref. Range 06/26/2017 03:57  Prothrombin Time Latest Ref Range: 11.4 - 15.2 seconds 15.8 (H)  INR Unknown 1.27    Caroline More, DO 06/26/2017, 8:29 AM PGY-1, [place] Intern pager: [phone], text pages welcome  Upper Level Addendum:  I have seen and evaluated this patient along with Dr. Tammi Klippel and reviewed the above note, making necessary revisions in green.   Phill Myron, D.O. 06/26/2017, 9:04 AM PGY-3, [place]

## 2017-06-26 NOTE — Progress Notes (Signed)
   06/26/17 1541  Clinical Encounter Type  Visited With Patient;Health care provider  Visit Type Initial  Referral From Nurse  Consult/Referral To Chaplain  Spiritual Encounters  Spiritual Needs Emotional  Stress Factors  Patient Stress Factors Major life changes   Responded to a SCC for major life transition.  Patient welcomed my visit. She was in the bed and appeared to not be feeling great.  Stated she was diagnosed with cancer just a few months ago and had chemo on Tuesday.  She was hopefull the chemo would help, but is not sure what to think as she is back in the hospital.  She asked me, why do they keep asking me if I have my affairs, my paperwork in order?  I tell them yes, but they keep asking, do they think I am going to die soon?  (This is what she said) She also stated that it can get depressing thinking about all that is going on.  Stated she is not afraid to die, but doesn't know much about it.  She and her significant other have been together 10 years and when she talks about [name] it brings joy to her face.  States she and her son don't have the greatest relationship, they talk some but I gather are not real close.  He lives in [place].  Patient was having a hard time staying awake and said she wanted to rest.  Will follow and support as needed. Chaplain Katherene Ponto

## 2017-06-26 NOTE — Consult Note (Signed)
Consultation Note Date: 06/26/2017   Patient Name: Margareta Laureano  DOB: December 26, 1955  MRN: [id number]  Age / Sex: 62 y.o., female  PCP: Nuala Alpha, DO Referring Physician: Dickie La, MD  Reason for Consultation: Establishing goals of care  HPI/Patient Profile: 62 y.o. 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 She was also recently hospitalized 06/13/17 to 06/17/17 with PE. Patient was seen in consultation by palliative care during that hospitalization and we are again asked to follow to help with symptom management and clarification of goals.   Clinical Assessment and Goals of Care: I met with Ms. Rainwater. However, an extensive conversation regarding goals was limited by symptoms. Patient is having ongoing nausea and pain. Patient says current pain regimen in adequate (transdermal fentanyl, hydromorphone tablets, oxycontin, gabapentin, and lidoderm). Would generally recommend simplifying opioid regimen to include one short-acting and one long-acting agent.  She is being tx'd with Zofran prn for nausea. Would recommend resuming Compazine, which she is on at home and says works well.    I will return at another time to clarify goals/code status.   SUMMARY OF RECOMMENDATIONS    1. Continue supportive care  2. Consider adding Compazine (home med) prn for nausea   Discharge Planning: To Be Determined      Primary Diagnoses: Present on Admission: . Thrombocytopenia ([place])   I have reviewed the medical record, interviewed the patient and family, and examined the  patient. The following aspects are pertinent.  Past Medical History:  Diagnosis Date  . Abdominal pain 06/02/2017  . Abdominal pain, right upper quadrant 04/03/2016  . Acute dyspnea   . Alcohol abuse    H/o withdrawal   . Alcohol withdrawal ([place]) 10/02/2013  . Anxiety   . Cancer [facility])    unknown primary. known bone mets.  . Chronic diarrhea    hx of  . Constipation   . Depression   . Elevated liver function tests few yrs ago  . Fatty liver 10/11/2011  . FATTY LIVER DISEASE 10/01/2008   Qualifier: Diagnosis of  By: Nelson-Smith CMA (AAMA), Dottie    . Gastric ulcer 01/20/2012  . HIATAL HERNIA 10/01/2008   Qualifier: Diagnosis of  By: Nelson-Smith CMA (AAMA), Dottie    . Incisional hernia s/p lap/open repair with mesh 07/09/2016 07/09/2016  . Major depressive disorder, recurrent, severe without psychotic features ([place])   . Metastatic lung cancer (metastasis from lung to other site), right ([place]) 06/01/2017  . Pancreatic calcification 04/03/2016  . Pancreatic pain 03/31/2017  . Pancreatitis   . PONV (postoperative nausea and vomiting)   . 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 Rosacea   . Sciatica of right side 02/25/2017  . Wears glasses    Social  History   Socioeconomic History  . Marital status: Divorced    Spouse name: None  . Number of children: None  . Years of education: None  . Highest education level: None  Social Needs  . Financial resource strain: None  . Food insecurity - worry: None  . Food insecurity - inability: None  . Transportation needs - medical: None  . Transportation needs - non-medical: None  Occupational History  . None  Tobacco Use  . Smoking status: Heavy Tobacco Smoker    Packs/day: 1.00    Years: 15.00    Pack years: 15.00    Types: Cigarettes  . Smokeless tobacco: Never Used  . Tobacco comment: smoking a pack per day now  Substance and Sexual Activity  . Alcohol use: Yes    Alcohol/week: 1.5 - 2.0 oz    Types: 3 - 4 Standard drinks or  equivalent per week    Comment: quit 2016 - 2 drinks per week   . Drug use: Yes    Types: Marijuana    Comment: occasional  . 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  Other Topics Concern  . None  Social History Narrative   Has a son and some friends with whom she is close. Was in rehab in 09/2010 and was sober for a few months after.    Family History  Problem Relation Age of Onset  . Stroke Mother   . Heart disease Father   . Heart failure Father   . Heart disease Sister    Scheduled Meds: . apixaban  2.5 mg Oral BID  . feeding supplement (ENSURE ENLIVE)  237 mL Oral BID BM  . [START ON 06/27/2017] fentaNYL  75 mcg Transdermal Q72H  . folic acid  1 mg Oral Daily  . furosemide  20 mg Oral Daily  . gabapentin  300 mg Oral BID  . lidocaine  1 patch Transdermal Q24H  . mirtazapine  7.5 mg Oral QHS  . nicotine  14 mg Transdermal Daily  . oxyCODONE  20 mg Oral Q12H  . pantoprazole  40 mg Oral Daily  . polyethylene glycol  17 g Oral Daily  . sodium chloride flush  10-40 mL Intracatheter Q12H   Continuous Infusions: PRN Meds:.acetaminophen **OR** acetaminophen, guaiFENesin-dextromethorphan, HYDROmorphone, ipratropium-albuterol, LORazepam, ondansetron (ZOFRAN) IV, ramelteon Medications Prior to Admission:  Prior to Admission medications   Medication Sig Start Date End Date Taking? Authorizing Provider  apixaban (ELIQUIS) 5 MG TABS tablet Take 1 tablet (5 mg total) by mouth 2 (two) times daily. 06/21/17  Yes [place], Holli Humbles, NP  Calcium Carbonate-Vitamin D (CALCIUM 600+D PO) Take 1 tablet by mouth daily.   Yes [provider]  dexamethasone (DECADRON) 6 MG tablet Take 3 tablets (18 mg total) by mouth daily. 06/17/17  Yes Lockamy, Timothy, DO  diphenoxylate-atropine (LOMOTIL) 2.5-0.025 MG tablet Take 2 tablets by mouth 4 (four) times daily as needed for diarrhea or loose stools. 06/07/17  Yes Gery Pray, MD  EQ NICOTINE 14 MG/24HR  patch Place 14 mg onto the skin daily.  03/31/17  Yes [provider]  feeding supplement, ENSURE ENLIVE, (ENSURE ENLIVE) LIQD Take 237 mLs by mouth 2 (two) times daily between meals. 06/17/17  Yes Lockamy, Timothy, DO  fentaNYL (DURAGESIC - DOSED MCG/HR) 75 MCG/HR Place 1 patch (75 mcg total) onto the skin every 3 (three) days. 06/20/17  Yes Lockamy, Timothy, DO  folic acid (FOLVITE) 1 MG tablet  Take 1 tablet (1 mg total) by mouth daily. 06/21/17  Yes Ennever, Rudell Cobb, MD  furosemide (LASIX) 20 MG tablet Take 1 tablet (20 mg total) by mouth daily. 06/24/17  Yes Volanda Napoleon, MD  gabapentin (NEURONTIN) 600 MG tablet Take 0.5 tablets (300 mg total) by mouth 2 (two) times daily. 06/17/17  Yes Lockamy, Timothy, DO  HYDROmorphone (DILAUDID) 2 MG tablet Take 1 tablet (2 mg total) by mouth every 4 (four) hours as needed (breakthrough pain). 06/24/17  Yes Ennever, Rudell Cobb, MD  ibuprofen (ADVIL,MOTRIN) 200 MG tablet Take 200 mg by mouth every 6 (six) hours as needed for moderate pain.   Yes [provider]  ipratropium-albuterol (DUONEB) 0.5-2.5 (3) MG/3ML SOLN Take 3 mLs by nebulization every 6 (six) hours as needed. 06/17/17  Yes Lockamy, Timothy, DO  lidocaine-prilocaine (EMLA) cream Apply 1 application topically as needed. Apply to port one hour before appointment 06/21/17  Yes Ennever, Rudell Cobb, MD  LORazepam (ATIVAN) 1 MG tablet Take 1 tablet (1 mg total) by mouth 3 (three) times daily as needed. for anxiety Patient taking differently: Take 1 mg by mouth 4 (four) times daily. for anxiety 06/03/17  Yes Ennever, Rudell Cobb, MD  mirtazapine (REMERON) 7.5 MG tablet Take 1 tablet (7.5 mg total) by mouth at bedtime. 06/17/17  Yes Lockamy, Timothy, DO  ondansetron (ZOFRAN) 8 MG tablet Take one tablet twice daily for 3 days starting the day after chemo. Then take every 8hrs as needed for nausea/vomitting. 06/21/17  Yes Volanda Napoleon, MD  oxyCODONE (OXYCONTIN) 20 mg 12 hr tablet Take 1 tablet (20 mg total) by  mouth every 12 (twelve) hours. 06/17/17  Yes Lockamy, Timothy, DO  pantoprazole (PROTONIX) 40 MG tablet Take 1 tablet (40 mg total) by mouth daily. 06/18/17  Yes Lockamy, Christia Reading, DO  prochlorperazine (COMPAZINE) 10 MG tablet Take 1 tablet (10 mg total) by mouth every 6 (six) hours as needed for nausea or vomiting. 06/21/17  Yes Volanda Napoleon, MD  ramelteon (ROZEREM) 8 MG tablet Take 1 tablet (8 mg total) by mouth at bedtime as needed for sleep. 06/21/17  Yes [place], Holli Humbles, NP  valACYclovir (VALTREX) 1000 MG tablet Take 1 tablet (1,000 mg total) by mouth daily as needed (fever blisters). 06/04/17  Yes Bruning, Ashlyn, PA-C  docusate sodium (COLACE) 250 MG capsule Take 1 capsule (250 mg total) by mouth daily. While taking pain medication Patient not taking: Reported on 06/13/2017 03/30/17   Duffy Bruce, MD  prochlorperazine (COMPAZINE) 10 MG tablet Take 1 tablet (10 mg total) by mouth every 6 (six) hours as needed for nausea or vomiting. Patient not taking: Reported on 06/13/2017 05/13/17   Gery Pray, MD   Allergies  Allergen Reactions  . Ciprofloxacin Nausea Only   Review of Systems  Constitutional: Positive for appetite change.  Gastrointestinal: Positive for diarrhea and nausea.    Physical Exam  Constitutional: She is oriented to person, place, and time. She appears well-developed and well-nourished.  Frail appearing  Neck: Normal range of motion.  Pulmonary/Chest: Effort normal.  Musculoskeletal: Normal range of motion. She exhibits no edema.  Neurological: She is alert and oriented to person, place, and time.  Skin: Skin is warm and dry.  Psychiatric: She has a normal mood and affect.    Vital Signs: BP 133/69   Pulse (!) 105   Temp 98.4 F (36.9 C) (Oral)   Resp 18   Ht '5\' 6"'$  (1.676 m)   Wt 66.3 kg (146  lb 2.6 oz)   LMP 12/08/2006   SpO2 100%   BMI 23.59 kg/m  Pain Assessment: 0-10   Pain Score: 8    SpO2: SpO2: 100 % O2 Device:SpO2: 100 % O2 Flow Rate: .O2  Flow Rate (L/min): 2 L/min  IO: Intake/output summary:   Intake/Output Summary (Last 24 hours) at 06/26/2017 1446 Last data filed at 06/26/2017 0900 Gross per 24 hour  Intake 240 ml  Output 0 ml  Net 240 ml    LBM:   Baseline Weight: Weight: 65.8 kg (145 lb) Most recent weight: Weight: 66.3 kg (146 lb 2.6 oz)     Palliative Assessment/Data:   Flowsheet Rows     Most Recent Value  Intake Tab  Referral Department  Hospitalist  Unit at Time of Referral  Med/Surg Unit  Palliative Care Primary Diagnosis  Cancer  Date Notified  06/26/17  Palliative Care Type  Return patient Palliative Care  Reason for referral  Clarify Goals of Care  Date of Admission  06/26/17  Date first seen by Palliative Care  06/26/17  # of days Palliative referral response time  0 Day(s)  # of days IP prior to Palliative referral  0  Clinical Assessment  Palliative Performance Scale Score  60%  Pain Max last 24 hours  Not able to report  Pain Min Last 24 hours  Not able to report  Dyspnea Max Last 24 Hours  Not able to report  Dyspnea Min Last 24 hours  Not able to report  Nausea Max Last 24 Hours  Not able to report  Nausea Min Last 24 Hours  Not able to report  Anxiety Max Last 24 Hours  Not able to report  Anxiety Min Last 24 Hours  Not able to report  Other Max Last 24 Hours  Not able to report  Psychosocial & Spiritual Assessment  Palliative Care Outcomes      Time In: 1430 Time Out: 1500 Time Total: 30 minutes Greater than 50%  of this time was spent counseling and coordinating care related to the above assessment and plan.  Signed by: Irean Hong, NP   Please contact Palliative Medicine Team phone at [phone] for questions and concerns.  For individual provider: See Shea Evans

## 2017-06-26 NOTE — ED Notes (Signed)
Per patient tried afrin at home X 2 last night with no decrease in the nose bleed.

## 2017-06-26 NOTE — Progress Notes (Signed)
Call to ED to get report. No answer. Bartholomew Crews, RN

## 2017-06-26 NOTE — Progress Notes (Signed)
FPTS Interim Progress Note  Spoke to Dr. Marin Olp regarding patient. Per Dr. Marin Olp patient should be transfused platelets and continue Elliquis. Per Dr. Marin Olp can keep patient in [facility] and he will consult on patient.  -continue Elliquis -transfuse platelets  -continue to monitor platelets  Caroline More, DO 06/26/2017, 9:42 AM PGY-1, [place] Medicine Service pager [phone]

## 2017-06-27 ENCOUNTER — Inpatient Hospital Stay (HOSPITAL_COMMUNITY): Payer: BLUE CROSS/BLUE SHIELD

## 2017-06-27 ENCOUNTER — Other Ambulatory Visit: Payer: Self-pay

## 2017-06-27 DIAGNOSIS — I959 Hypotension, unspecified: Secondary | ICD-10-CM

## 2017-06-27 DIAGNOSIS — E871 Hypo-osmolality and hyponatremia: Secondary | ICD-10-CM

## 2017-06-27 DIAGNOSIS — R109 Unspecified abdominal pain: Secondary | ICD-10-CM

## 2017-06-27 DIAGNOSIS — R509 Fever, unspecified: Secondary | ICD-10-CM

## 2017-06-27 DIAGNOSIS — R197 Diarrhea, unspecified: Secondary | ICD-10-CM

## 2017-06-27 LAB — COMPREHENSIVE METABOLIC PANEL
ALT: 26 U/L (ref 14–54)
ALT: 22 U/L (ref 14–54)
ANION GAP: 9 (ref 5–15)
AST: 23 U/L (ref 15–41)
AST: 27 U/L (ref 15–41)
Albumin: 2.2 g/dL — ABNORMAL LOW (ref 3.5–5.0)
Albumin: 1.8 g/dL — ABNORMAL LOW (ref 3.5–5.0)
Alkaline Phosphatase: 191 U/L — ABNORMAL HIGH (ref 38–126)
Alkaline Phosphatase: 170 U/L — ABNORMAL HIGH (ref 38–126)
Anion gap: 10 (ref 5–15)
BUN: 18 mg/dL (ref 6–20)
BUN: 15 mg/dL (ref 6–20)
CALCIUM: 5.2 mg/dL — AB (ref 8.9–10.3)
CO2: 19 mmol/L — ABNORMAL LOW (ref 22–32)
CO2: 16 mmol/L — AB (ref 22–32)
Calcium: 6.2 mg/dL — CL (ref 8.9–10.3)
Chloride: 91 mmol/L — ABNORMAL LOW (ref 101–111)
Chloride: 98 mmol/L — ABNORMAL LOW (ref 101–111)
Creatinine, Ser: 0.7 mg/dL (ref 0.44–1.00)
Creatinine, Ser: 0.77 mg/dL (ref 0.44–1.00)
GFR calc Af Amer: >60 mL/min (ref >60)
GFR calc non Af Amer: >60 mL/min (ref >60)
GFR calc non Af Amer: >60 mL/min (ref >60)
Glucose, Bld: 251 mg/dL — ABNORMAL HIGH (ref 65–99)
Glucose, Bld: 191 mg/dL — ABNORMAL HIGH (ref 65–99)
POTASSIUM: 4.1 mmol/L (ref 3.5–5.1)
Potassium: 4.5 mmol/L (ref 3.5–5.1)
SODIUM: 123 mmol/L — AB (ref 135–145)
Sodium: 120 mmol/L — ABNORMAL LOW (ref 135–145)
Total Bilirubin: 0.9 mg/dL (ref 0.3–1.2)
Total Bilirubin: 0.8 mg/dL (ref 0.3–1.2)
Total Protein: 5.7 g/dL — ABNORMAL LOW (ref 6.5–8.1)
Total Protein: 4.8 g/dL — ABNORMAL LOW (ref 6.5–8.1)

## 2017-06-27 LAB — CBC
HCT: 32.3 % — ABNORMAL LOW (ref 36.0–46.0)
HCT: 27.1 % — ABNORMAL LOW (ref 36.0–46.0)
HEMOGLOBIN: 9.1 g/dL — AB (ref 12.0–15.0)
Hemoglobin: 10.7 g/dL — ABNORMAL LOW (ref 12.0–15.0)
MCH: 28.2 pg (ref 26.0–34.0)
MCH: 28.3 pg (ref 26.0–34.0)
MCHC: 33.1 g/dL (ref 30.0–36.0)
MCHC: 33.6 g/dL (ref 30.0–36.0)
MCV: 85 fL (ref 78.0–100.0)
MCV: 84.4 fL (ref 78.0–100.0)
Platelets: 42 10*3/uL — ABNORMAL LOW (ref 150–400)
Platelets: 22 10*3/uL — CL (ref 150–400)
RBC: 3.8 MIL/uL — ABNORMAL LOW (ref 3.87–5.11)
RBC: 3.21 MIL/uL — ABNORMAL LOW (ref 3.87–5.11)
RDW: 16.6 % — ABNORMAL HIGH (ref 11.5–15.5)
RDW: 16.8 % — ABNORMAL HIGH (ref 11.5–15.5)
WBC: 2.9 10*3/uL — ABNORMAL LOW (ref 4.0–10.5)
WBC: 3.4 10*3/uL — AB (ref 4.0–10.5)

## 2017-06-27 LAB — PREPARE PLATELET PHERESIS
UNIT DIVISION: 0
Unit division: 0

## 2017-06-27 LAB — PROTIME-INR
INR: 1.62
Prothrombin Time: 19.1 seconds — ABNORMAL HIGH (ref 11.4–15.2)

## 2017-06-27 LAB — BPAM PLATELET PHERESIS
BLOOD PRODUCT EXPIRATION DATE: 201903092359
Blood Product Expiration Date: 201903092359
ISSUE DATE / TIME: 201903091537
ISSUE DATE / TIME: 201903091233
UNIT TYPE AND RH: 6200
Unit Type and Rh: 6200

## 2017-06-27 LAB — TROPONIN I
TROPONIN I: 0.03 ng/mL — AB (ref <0.03)
Troponin I: 0.03 ng/mL (ref <0.03)

## 2017-06-27 LAB — LACTIC ACID, PLASMA
Lactic Acid, Venous: 1.7 mmol/L (ref 0.5–1.9)
Lactic Acid, Venous: 3.5 mmol/L (ref 0.5–1.9)
Lactic Acid, Venous: 2.8 mmol/L (ref 0.5–1.9)

## 2017-06-27 LAB — LIPASE, BLOOD: LIPASE: 16 U/L (ref 11–51)

## 2017-06-27 LAB — MRSA PCR SCREENING: MRSA by PCR: NEGATIVE

## 2017-06-27 MED ORDER — SODIUM CHLORIDE 0.9 % IV BOLUS (SEPSIS)
1000.0000 mL | Freq: Once | INTRAVENOUS | Status: AC
Start: 2017-06-27 — End: 2017-06-27
  Administered 2017-06-27: 1000 mL via INTRAVENOUS

## 2017-06-27 MED ORDER — VANCOMYCIN HCL 10 G IV SOLR
1500.0000 mg | Freq: Once | INTRAVENOUS | Status: AC
  Administered 2017-06-27: 1500 mg via INTRAVENOUS
  Filled 2017-06-27: qty 1500

## 2017-06-27 MED ORDER — METHYLPREDNISOLONE SODIUM SUCC 125 MG IJ SOLR
125.0000 mg | Freq: Once | INTRAMUSCULAR | Status: AC
  Administered 2017-06-27: 125 mg via INTRAVENOUS
  Filled 2017-06-27: qty 2

## 2017-06-27 MED ORDER — SODIUM CHLORIDE 0.9 % IV BOLUS (SEPSIS)
1000.0000 mL | Freq: Once | INTRAVENOUS | Status: AC
  Administered 2017-06-27: 1000 mL via INTRAVENOUS

## 2017-06-27 MED ORDER — SODIUM CHLORIDE 0.9 % IV SOLN
INTRAVENOUS | Status: DC
  Administered 2017-06-27 – 2017-06-28 (×4): via INTRAVENOUS

## 2017-06-27 MED ORDER — HYDROCORTISONE NA SUCCINATE PF 100 MG IJ SOLR
50.0000 mg | Freq: Four times a day (QID) | INTRAMUSCULAR | Status: DC
  Administered 2017-06-27 – 2017-07-03 (×24): 50 mg via INTRAVENOUS
  Filled 2017-06-27 (×23): qty 2

## 2017-06-27 MED ORDER — SODIUM CHLORIDE 0.9 % IV SOLN
2.0000 g | Freq: Once | INTRAVENOUS | Status: AC
  Administered 2017-06-27: 2 g via INTRAVENOUS
  Filled 2017-06-27: qty 20

## 2017-06-27 MED ORDER — DOCUSATE SODIUM 100 MG PO CAPS: 100.0000 mg | ORAL_CAPSULE | Freq: Two times a day (BID) | ORAL | Status: DC | PRN

## 2017-06-27 MED ORDER — VANCOMYCIN HCL IN DEXTROSE 750-5 MG/150ML-% IV SOLN
750.0000 mg | Freq: Two times a day (BID) | INTRAVENOUS | Status: DC
  Administered 2017-06-27 – 2017-06-28 (×2): 750 mg via INTRAVENOUS
  Filled 2017-06-27 (×3): qty 150

## 2017-06-27 MED ORDER — LOPERAMIDE HCL 2 MG PO CAPS
2.0000 mg | ORAL_CAPSULE | Freq: Two times a day (BID) | ORAL | Status: DC | PRN
  Administered 2017-06-27 – 2017-06-30 (×5): 2 mg via ORAL
  Filled 2017-06-27 (×5): qty 1

## 2017-06-27 MED ORDER — DEXTROSE 5 % IV SOLN
0.0000 ug/min | INTRAVENOUS | Status: DC
  Administered 2017-06-27: 2 ug/min via INTRAVENOUS
  Filled 2017-06-27: qty 4

## 2017-06-27 MED ORDER — OXYCODONE HCL 5 MG PO TABS
5.0000 mg | ORAL_TABLET | Freq: Once | ORAL | Status: AC
  Administered 2017-06-27: 5 mg via ORAL
  Filled 2017-06-27: qty 1

## 2017-06-27 MED ORDER — PIPERACILLIN-TAZOBACTAM 3.375 G IVPB
3.3750 g | Freq: Three times a day (TID) | INTRAVENOUS | Status: DC
  Administered 2017-06-27 – 2017-06-28 (×3): 3.375 g via INTRAVENOUS
  Filled 2017-06-27 (×5): qty 50

## 2017-06-27 NOTE — Discharge Summary (Signed)
[facility] Discharge Summary  Patient name: [name] Medical record number: [id number] Date of birth: Jul 22, 1955 Age: 62 y.o. 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 Criss Rosales on day of discharge General: awake and alert, frail and clearly ill Cardiovascular: RRR, no MRG Respiratory: coarse breath sounds throughout, slightly increased WOB Abdomen: soft, non tender, non distended, bowel sounds normal  Extremities: non tender, 3+ pedal edema   Brief Hospital Course:  [name] is a 62 y.o. female presenting with chronic nose bleeding x 2 days. Patient was found to be thrombocytopenic to 16 on admission. Thrombocytopenia believed to be 2/2 recent chemotherapy with likely underlying bone marrow infiltration from malignancy as well as pseudomonas infection. Patient was transfused with platelets and platelet levels slightly improved. Hematology was consulted and recommended continuing Eliquis at half dose (patient with h/o recent hospitalization for PE and bilateral DVT). Eliquis was, however, discontinued due to continued bleeding and possible bleeding into lungs.   During admission patient spiked a fever and became altered and met sepsis criteria. BP was very low despite 3L fluid bolus and CCM was consulted. CT head revealed no acute  intracranial abnormality noted. Patient was transferred to ICU. Antibiotics were given, vanc/zosyn (3/10-3/11) and then cefepime (3/11-3/16) once cultures grew pseudomonas. ID consulted and recommended antibiotic course.   During admission palliative care also consulted and recommended changing pain regimen to xoycodone '20mg'$  q4h prn and IV hydromorphone prn if she cannot swallow.   Patient respiratory status continued to deteriorate and patient became edematous. Patient continued IV diuresisUnclear if edema was due to fluid, or per Dr. Marin Olp (Oncologist), if it was bleeding into lungs.   As patient was transferred to ICU, patient's code status was changed to DNR. Per Jeri Modena, patient expressed wishes that she would not have wanted aggressive extreme life sustaining measures if sustained cardiopulm arrest.   Prior to discharge patient made decision to change to comfort care. Antibiotics were discontinued and electrolyte replacement and labs were discontinued. Patient had home hospice arranged prior to discharge. Care management assisted with delivery of hospital bed to home prior to discharge.   Issues for Follow Up:  1. Patient transitioned to comfort care 2. Will follow with hospice as outpatient   Significant Procedures:  Patient received multiple units of platelets and PRBCs prior to discharge   Significant Labs and Imaging:  Recent Labs  Lab 07/01/17 2019 07/02/17 0507 07/03/17 0400  WBC 3.8* 4.7 5.3  HGB 9.6* 9.5* 9.3*  HCT 28.5* 27.9* 28.1*  PLT 45* 33* 22*   Recent Labs  Lab 06/29/17 1940 06/30/17 0327  06/30/17 2100 07/01/17 0509 07/01/17 2019 07/02/17 0507 07/03/17 0400 07/03/17 1446  NA  --  130*   < > 125* 129* 130* 131* 131*  --   K  --  2.4*   < > 2.1* 3.5 2.3* 2.7* 2.5*  --   CL  --  100*   < > 86* 88* 85* 86* 84*  --  CO2  --  21*   < > '25 28 30 '$ 32 34*  --   GLUCOSE  --  267*   < > 353* 202* 307* 176* 244* 458*  BUN  --  9   < > '8 9 13 11 13  '$ --    CREATININE  --  0.61   < > 0.73 0.71 0.83 0.68 0.66  --   CALCIUM  --  5.6*   < > 5.3* 5.6* 5.7* 5.6* 5.4*  --   MG 1.9  --   --  1.8  --  1.4* 1.3* 1.4*  --   PHOS  --   --    < > 1.2* 1.9* 1.6* 2.3* 1.6*  --   ALKPHOS  --  136*  --   --  159* 160* 163* 169*  --   AST  --  25  --   --  30 33 29 34  --   ALT  --  28  --   --  '30 30 28 27  '$ --   ALBUMIN  --  1.4*  --   --  1.6* 1.9* 1.8* 1.7*  --    < > = values in this interval not displayed.     Dg Chest 2 View  Result Date: 06/27/2017 CLINICAL DATA:  Fever. EXAM: CHEST - 2 VIEW COMPARISON:  Chest radiographs and CTA 06/13/2017 FINDINGS: Right jugular Port-A-Cath terminates over the lower SVC. The cardiac silhouette is normal in size. Aortic atherosclerosis is noted. There is a moderate-sized hiatal hernia. The lungs are hyperinflated with underlying emphysema. Peribronchial thickening and coarsened interstitial markings bilaterally are mildly increased compared to the prior radiographs with minimal patchy density in the left perihilar and right lower lungs. Trace pleural effusions are questioned. No pneumothorax is identified. IMPRESSION: COPD with mildly increased predominantly interstitial type densities bilaterally which may reflect superimposed acute infection, possibly atypical or viral. Electronically Signed   By: Logan Bores M.D.   On: 06/27/2017 07:51   Dg Chest 2 View  Result Date: 06/13/2017 CLINICAL DATA:  Shortness of breath.  History of lung cancer. EXAM: CHEST  2 VIEW COMPARISON:  August 01, 2014 FINDINGS: No pneumothorax. The heart, hila, and mediastinum are unremarkable. Small hiatal hernia. The nodularity in the right upper lung on the recent PET-CT is not appreciated on this study. IMPRESSION: No cause for shortness of breath or chest pain identified. The no nodularity in the right apex is not well assessed with this study. Electronically Signed   By: Dorise Bullion III M.D   On: 06/13/2017 10:29   Ct Head Wo  Contrast  Result Date: 06/27/2017 CLINICAL DATA:  History of lung carcinoma with confusion, initial encounter EXAM: CT HEAD WITHOUT CONTRAST TECHNIQUE: Contiguous axial images were obtained from the base of the skull through the vertex without intravenous contrast. COMPARISON:  06/05/2017 MRI, 03/02/2010 CT of the head FINDINGS: Brain: No evidence of acute infarction, hemorrhage, hydrocephalus, extra-axial collection or mass lesion/mass effect. Vascular: No hyperdense vessel or unexpected calcification. Skull: Changes of hemangiomas are noted similar to that seen on recent MRI. Sinuses/Orbits: Orbits are within normal limits. Mucosal thickening is noted within the maxillary antra bilaterally as well as within the ethmoid and sphenoid sinuses. Small air-fluid level is noted in the right maxillary antrum. Other: None IMPRESSION: Changes of sinusitis. No acute intracranial abnormality is noted. Electronically Signed   By: Inez Catalina M.D.   On: 06/27/2017 18:59   Ct Angio Chest  Pe W And/or Wo Contrast  Result Date: 06/13/2017 CLINICAL DATA:  Shortness of breath.  Suspected PE. EXAM: CT ANGIOGRAPHY CHEST WITH CONTRAST TECHNIQUE: Multidetector CT imaging of the chest was performed using the standard protocol during bolus administration of intravenous contrast. Multiplanar CT image reconstructions and MIPs were obtained to evaluate the vascular anatomy. CONTRAST:  80m ISOVUE-370 IOPAMIDOL (ISOVUE-370) INJECTION 76% COMPARISON:  Chest CT May 25, 2017. Chest x-ray June 13, 2017 FINDINGS: Cardiovascular: The heart and coronary arteries are unchanged. Atherosclerotic changes are seen in the nonaneurysmal aorta. 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 No dissection. There is narrowing of right upper lobe pulmonary arterial branches due to soft tissue in the right hilum. Pulmonary emboli are seen in the right lung bases involving segmental and more peripheral branches. No other emboli identified.  Mediastinum/Nodes: There is soft tissue in the right hilum which encases the right upper lobe bronchus and upper lobe pulmonary arterial branches. This soft tissue measures up to 2.8 cm. No other adenopathy. There is a moderate hiatal hernia. The thyroid is normal. A Port-A-Cath is identified. No effusions. Lungs/Pleura: The spiculated nodule in the right upper lobe measures 1.6 by 1.0 cm today versus 1.4 x 0.9 cm previously. The small difference could be due to difference in slice selection given the short interval. Scattered scarring. Stable 5 mm nodule in the right lung on series 6, image 77. Other nodules are stable as well. No new masses. Mild ground-glass in the medial right lung on series 6, image 102 is new and could be infectious. No new nodules. No other infiltrates. Upper Abdomen: Air in the liver is stable, likely from previous sphincterotomy. Prominence of the left adrenal gland is likely hyperplasia without nodularity. No other abnormalities in the upper abdomen. Musculoskeletal: Bony metastatic disease again identified in the spine, manubrium, sternum, and ribs. Review of the MIP images confirms the above findings. IMPRESSION: 1. Pulmonary emboli in the right lower lobe involving segmental and more peripheral branches. 2. Mild ground-glass in the medial right lung could be infectious. 3. The spiculated nodule in the right upper lobe is not significantly changed accounting for difference in technique. 4. Increasing soft tissue in the right hilum with encasement of the right upper lobe bronchus and right upper lobe pulmonary arterial branches resulting in some narrowing. This is suspicious for disease involvement. 5. Known bony metastatic disease. 6. Atherosclerotic changes in the nonaneurysmal aorta. 7. Moderate hiatal hernia. Findings called to and discussed with Dr. SAshok CordiaAortic Atherosclerosis (ICD10-I70.0). Electronically Signed   By: DDorise BullionIII M.D   On: 06/13/2017 13:30   Ir [place]Guide  Vasc Access Right  Result Date: 06/09/2017 CLINICAL DATA:  Lung cancer EXAM: TUNNEL POWER PORT PLACEMENT WITH SUBCUTANEOUS POCKET UTILIZING ULTRASOUND & FLOUROSCOPY FLUOROSCOPY TIME:  30 seconds.  Five mGy. MEDICATIONS AND MEDICAL HISTORY: Versed 4 mg, Fentanyl 100 mcg. Additional Medications: Ancef 2 g. Antibiotics were given within 2 hours of the procedure. ANESTHESIA/SEDATION: Moderate sedation time: 26 minutes. Nursing monitored the the patient during the procedure. PROCEDURE: After written informed consent was obtained, patient was placed in the supine position on angiographic table. The right neck and chest was prepped and draped in a sterile fashion. Lidocaine was utilized for local anesthesia. The right jugular vein was noted to be patent initially with ultrasound. Under sonographic guidance, a micropuncture needle was inserted into the right IJ vein (Ultrasound and fluoroscopic image documentation was performed). The needle was removed over an 018 wire which was exchanged for  a Amplatz. This was advanced into the IVC. An 8-French dilator was advanced over the Amplatz. A small incision was made in the right upper chest over the anterior right second rib. Utilizing blunt dissection, a subcutaneous pocket was created in the caudal direction. The pocket was irrigated with a copious amount of sterile normal saline. The port catheter was tunneled from the chest incision, and out the neck incision. The reservoir was inserted into the subcutaneous pocket and secured with two 3-0 Ethilon stitches. A peel-away sheath was advanced over the Amplatz wire. The port catheter was cut to measure length and inserted through the peel-away sheath. The peel-away sheath was removed. The chest incision was closed with 3-0 Vicryl interrupted stitches for the subcutaneous tissue and a running of 4-0 Vicryl subcuticular stitch for the skin. The neck incision was closed with a 4-0 Vicryl subcuticular stitch. Derma-bond was applied  to both surgical incisions. The port reservoir was flushed and instilled with heparinized saline. No complications. FINDINGS: A right IJ vein Port-A-Cath is in place with its tip at the cavoatrial junction. COMPLICATIONS: None IMPRESSION: Successful 8 French right internal jugular vein power port placement with its tip at the SVC/RA junction. Electronically Signed   By: Marybelle Killings M.D.   On: 06/09/2017 16:41   Dg Chest Port 1 View  Result Date: 07/01/2017 CLINICAL DATA:  62 year old female with metastatic lung cancer. Shortness of breath and fever. Pulmonary emboli last month. EXAM: PORTABLE CHEST 1 VIEW COMPARISON:  06/30/2017 and earlier. FINDINGS: Portable AP upright view at 0555 hours. Stable right chest porta cath, accessed. Diffuse reticulonodular pulmonary opacity has developed since February, is progressed since 06/28/2017, but stable since yesterday. No associated pneumothorax or pleural effusion. Stable cardiac size and mediastinal contours. Moderate hiatal hernia re-demonstrated. Negative visible bowel gas pattern. IMPRESSION: Progressed diffuse reticulonodular pulmonary opacity since 06/28/2017, stable since yesterday. Differential considerations in this clinical setting include acute viral/atypical infection, lymphangitic carcinomatosis, and less likely acute interstitial edema. Electronically Signed   By: Genevie Ann M.D.   On: 07/01/2017 09:21   Dg Chest Port 1 View  Result Date: 06/30/2017 CLINICAL DATA:  Increasing shortness of breath, history of lung carcinoma EXAM: PORTABLE CHEST 1 VIEW COMPARISON:  06/28/2017 FINDINGS: Right-sided chest wall port is again noted and stable. Cardiac shadow is stable. Diffuse increased interstitial changes are identified increased from the prior exam consistent with pulmonary edema. No focal confluent infiltrate is seen. No sizable effusion is noted. No bony abnormality is seen. IMPRESSION: Increasing interstitial changes consistent with edema. Electronically  Signed   By: Inez Catalina M.D.   On: 06/30/2017 08:13   Dg Chest Port 1 View  Result Date: 06/28/2017 CLINICAL DATA:  Septic shock.  History of lung carcinoma EXAM: PORTABLE CHEST 1 VIEW COMPARISON:  June 27, 2017 chest radiograph and chest CT June 13, 2017 FINDINGS: There is diffuse interstitial prominence consistent with either pulmonary edema or lymphangitic spread of tumor. There is atelectatic change in the left base with questionable airspace consolidation immediately adjacent to a hiatal hernia. Heart is upper normal in size. There is mild pulmonary venous hypertension. There is aortic atherosclerosis. There is no adenopathy apparent by radiography. Central catheter tip is in the superior vena cava near the cavoatrial junction. No pneumothorax. Bony metastases are better appreciated on recent CT than on this study, although several ribs show evidence suggesting bony metastatic disease. Moderate hiatal hernia noted. There is aortic atherosclerosis. IMPRESSION: Interstitial prominence. Question a degree of pulmonary edema versus lymphangitic  spread of tumor. Both entities may exist concurrently. Note that there is pulmonary venous hypertension. Question atelectasis versus early pneumonia left base. Moderate hiatal hernia. Stable cardiac silhouette. Bony metastatic disease, better seen on recent CT. There is aortic atherosclerosis. Aortic Atherosclerosis (ICD10-I70.0). Electronically Signed   By: Lowella Grip III M.D.   On: 06/28/2017 07:55   Dg Chest Port 1v Same Day  Result Date: 07/02/2017 CLINICAL DATA:  Follow-up bilateral infiltrates EXAM: PORTABLE CHEST 1 VIEW COMPARISON:  07/01/2017 FINDINGS: Cardiac shadow is stable. Right chest wall port is again noted. Hiatal hernia is seen and stable. Diffuse bilateral opacities are again seen and stable. No new focal abnormality is noted. IMPRESSION: No change from the prior exam. Electronically Signed   By: Inez Catalina M.D.   On: 07/02/2017 09:03    Ir Fluoro Guide Port Insertion Right  Result Date: 06/09/2017 CLINICAL DATA:  Lung cancer EXAM: TUNNEL POWER PORT PLACEMENT WITH SUBCUTANEOUS POCKET UTILIZING ULTRASOUND & FLOUROSCOPY FLUOROSCOPY TIME:  30 seconds.  Five mGy. MEDICATIONS AND MEDICAL HISTORY: Versed 4 mg, Fentanyl 100 mcg. Additional Medications: Ancef 2 g. Antibiotics were given within 2 hours of the procedure. ANESTHESIA/SEDATION: Moderate sedation time: 26 minutes. Nursing monitored the the patient during the procedure. PROCEDURE: After written informed consent was obtained, patient was placed in the supine position on angiographic table. The right neck and chest was prepped and draped in a sterile fashion. Lidocaine was utilized for local anesthesia. The right jugular vein was noted to be patent initially with ultrasound. Under sonographic guidance, a micropuncture needle was inserted into the right IJ vein (Ultrasound and fluoroscopic image documentation was performed). The needle was removed over an 018 wire which was exchanged for a Amplatz. This was advanced into the IVC. An 8-French dilator was advanced over the Amplatz. A small incision was made in the right upper chest over the anterior right second rib. Utilizing blunt dissection, a subcutaneous pocket was created in the caudal direction. The pocket was irrigated with a copious amount of sterile normal saline. The port catheter was tunneled from the chest incision, and out the neck incision. The reservoir was inserted into the subcutaneous pocket and secured with two 3-0 Ethilon stitches. A peel-away sheath was advanced over the Amplatz wire. The port catheter was cut to measure length and inserted through the peel-away sheath. The peel-away sheath was removed. The chest incision was closed with 3-0 Vicryl interrupted stitches for the subcutaneous tissue and a running of 4-0 Vicryl subcuticular stitch for the skin. The neck incision was closed with a 4-0 Vicryl subcuticular stitch.  Derma-bond was applied to both surgical incisions. The port reservoir was flushed and instilled with heparinized saline. No complications. FINDINGS: A right IJ vein Port-A-Cath is in place with its tip at the cavoatrial junction. COMPLICATIONS: None IMPRESSION: Successful 8 French right internal jugular vein power port placement with its tip at the SVC/RA junction. Electronically Signed   By: Marybelle Killings M.D.   On: 06/09/2017 16:41    Results/Tests Pending at Time of Discharge:  Unresulted Labs (From admission, onward)   None      Discharge Medications:  Allergies as of 07/05/2017      Reactions   Ciprofloxacin Nausea Only      Medication List    STOP taking these medications   apixaban 5 MG Tabs tablet Commonly known as:  ELIQUIS   CALCIUM 600+D PO   dexamethasone 6 MG tablet Commonly known as:  DECADRON   docusate sodium 250  MG capsule Commonly known as:  COLACE   EQ NICOTINE 14 mg/24hr patch Generic drug:  nicotine   feeding supplement (ENSURE ENLIVE) Liqd   folic acid 1 MG tablet Commonly known as:  FOLVITE   furosemide 20 MG tablet Commonly known as:  LASIX   gabapentin 600 MG tablet Commonly known as:  NEURONTIN   HYDROmorphone 2 MG tablet Commonly known as:  DILAUDID   ipratropium-albuterol 0.5-2.5 (3) MG/3ML Soln Commonly known as:  DUONEB   lidocaine-prilocaine cream Commonly known as:  EMLA   oxyCODONE 20 mg 12 hr tablet Commonly known as:  OXYCONTIN   pantoprazole 40 MG tablet Commonly known as:  PROTONIX     TAKE these medications   diphenoxylate-atropine 2.5-0.025 MG tablet Commonly known as:  LOMOTIL Take 2 tablets by mouth 4 (four) times daily as needed for diarrhea or loose stools.   fentaNYL 75 MCG/HR Commonly known as:  DURAGESIC - dosed mcg/hr Place 1 patch (75 mcg total) onto the skin every 3 (three) days.   ibuprofen 200 MG tablet Commonly known as:  ADVIL,MOTRIN Take 200 mg by mouth every 6 (six) hours as needed for moderate  pain.   LORazepam 1 MG tablet Commonly known as:  ATIVAN Take 1 tablet (1 mg total) by mouth 3 (three) times daily as needed. for anxiety What changed:    when to take this  additional instructions   mirtazapine 7.5 MG tablet Commonly known as:  REMERON Take 1 tablet (7.5 mg total) by mouth at bedtime.   morphine 15 MG tablet Commonly known as:  MSIR Take 1 tablet (15 mg total) by mouth every 2 (two) hours as needed for severe pain (increased work of breathing).   ondansetron 8 MG tablet Commonly known as:  ZOFRAN Take one tablet twice daily for 3 days starting the day after chemo. Then take every 8hrs as needed for nausea/vomitting.   prochlorperazine 10 MG tablet Commonly known as:  COMPAZINE Take 1 tablet (10 mg total) by mouth every 6 (six) hours as needed for nausea or vomiting. What changed:  Another medication with the same name was removed. Continue taking this medication, and follow the directions you see here.   ramelteon 8 MG tablet Commonly known as:  ROZEREM Take 1 tablet (8 mg total) by mouth at bedtime as needed for sleep.   valACYclovir 1000 MG tablet Commonly known as:  VALTREX Take 1 tablet (1,000 mg total) by mouth daily as needed (fever blisters).       Discharge Instructions: Please refer to Patient Instructions section of EMR for full details.  Patient was counseled important signs and symptoms that should prompt return to medical care, changes in medications, dietary instructions, activity restrictions, and follow up appointments.   Follow-Up Appointments:   Caroline More, DO 07/05/2017, 10:18 PM PGY-1, [place]

## 2017-06-27 NOTE — Progress Notes (Signed)
Critical Value Troponin 0.03, MD paged

## 2017-06-27 NOTE — Progress Notes (Signed)
OT Cancellation Note  Patient Details Name: Cabrini Ruggieri MRN: [id number] DOB: 1955/09/10   Cancelled Treatment:    Reason Eval/Treat Not Completed: Medical issues which prohibited therapy(Critical lab value). Will re-attempt OT evaluation later today if pt is appropriate for therpay and if time permits.   Redmond Baseman, MS, OTR/L 06/27/2017, 8:14 AM

## 2017-06-27 NOTE — Progress Notes (Signed)
Daily Progress Note   Patient Name: [name]       Date: 06/27/2017 DOB: Sep 16, 1955  Age: 62 y.o. MRN#: [id number] Attending Physician: Dickie La, MD Primary Care Physician: Nuala Alpha, DO Admit Date: 06/26/2017  Reason for Consultation/Follow-up: Establishing goals of care  Subjective: 62 y.o. 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   Febrile overnight. Says she is uncomfortable but does not elucidate on specifics. Significant other at bedside.   Length of Stay: 1  Current Medications: Scheduled Meds:  . apixaban  2.5 mg Oral BID  . feeding supplement (ENSURE ENLIVE)  237 mL Oral BID BM  . fentaNYL  75 mcg Transdermal Q72H  . folic acid  1 mg Oral Daily  . gabapentin  300 mg Oral BID  . lidocaine  1 patch Transdermal Q24H  . mirtazapine  7.5 mg Oral QHS  . nicotine  14 mg Transdermal Daily  . oxyCODONE  20 mg Oral Q12H  . pantoprazole  40 mg Oral Daily  . polyethylene glycol  17 g Oral Daily  . sodium chloride flush  10-40 mL Intracatheter Q12H    Continuous Infusions: . sodium chloride    . piperacillin-tazobactam (ZOSYN)  IV 3.375 g (06/27/17 1040)  . vancomycin 1,500 mg (06/27/17 1045)  . vancomycin      PRN Meds: acetaminophen **OR** acetaminophen, docusate sodium, guaiFENesin-dextromethorphan, HYDROmorphone, ipratropium-albuterol, LORazepam, ondansetron (ZOFRAN) IV, prochlorperazine, ramelteon  Physical Exam    Constitutional: She is oriented to person, place, and time.  Neck: Normal range of motion.  Cardiovascular: Normal heart sounds.  tachy  Pulmonary/Chest: Effort normal and breath sounds normal.  Musculoskeletal: She exhibits no edema.  Neurological: She is alert and oriented to person, place, and time.  Skin: Skin is warm and dry.            Vital Signs: BP (!) 107/51 (BP Location: Left Arm)   Pulse (!) 107   Temp 98.2 F (36.8 C) (Oral)   Resp (!) 22   Ht 5'  6" (1.676 m)   Wt 67.1 kg (148 lb)   LMP 12/08/2006   SpO2 100%   BMI 23.89 kg/m  SpO2: SpO2: 100 % O2 Device: O2 Device: Room Air O2 Flow Rate: O2 Flow Rate (L/min): 2 L/min  LBM: Last BM Date: 06/26/17 Baseline Weight: Weight: 65.8 kg (145 lb) Most recent weight: Weight: 67.1 kg (148 lb)   Patient Active Problem List   Diagnosis Date Noted  . Fever   . Thrombocytopenia ([place]) 06/26/2017  . Palliative care encounter   . Anterior epistaxis   . Deep vein thrombosis (DVT) of distal vein of both lower extremities ([place])   . Cancer associated pain   . Palliative care by specialist   . Pulmonary embolism ([place]) 06/13/2017  . Metastatic primary lung cancer, unspecified laterality ([place]) 06/01/2017  . Adenocarcinoma of unknown primary ([place]) 05/13/2017  . Osseous metastasis ([place]) 05/13/2017  . Tobacco abuse 02/25/2017  . Chronic pancreatitis ([place]) 04/18/2016  . Goals of care, counseling/discussion 04/03/2016  . Substance induced mood disorder ([place]) 11/06/2014  . Bilateral knee pain 10/14/2014  . Alcohol use disorder, severe, dependence ([place]) 08/17/2014  . Suicidal behavior   . Alcohol dependence ([place]) 10/02/2013  . Major depression, recurrent ([place]) 10/02/2013  . Elevated liver function tests   . GERD (gastroesophageal reflux disease)   . Anxiety state 10/01/2008  . ESOPHAGEAL MOTILITY DISORDER 10/01/2008    Palliative Care Assessment & Plan   Assessment: Now on empiric abx for SIRS. Pain and nausea reportedly  improved. Patient reports having increased anxiety regarding medical problems. Has lorazepam ordered. RN reports patient having constipation. Stool softener ordered. Consider laxatives if needed given potential for opioid-induced constipation.   I met with pt's significant other, [name], given pt's feeling of discomfort and inability to comfortably engage in a conversation about goals. [name] says that patient is optimistic regarding treatment options. However, they are starting to plan for end of life wishes. [name] is her HCPOA. Patient has also completed a living will. They asked about a financial will but I explained that such a document typically needs consultation with an attorney.   [name] says that patient would want an attempt made at resuscitation as long as it is felt that there are reasonable treatment options available for the cancer. However, in the event that oncology does not feel optimistic regarding her prognosis, [name] thinks that patient would opt not to be resuscitated.   Recommendations/Plan:  Full code  Colace BID PRN   Thank you for allowing the Palliative Medicine Team to assist in the care of this patient.   Time In: 1130 Time Out: 1200 Total Time 30 mins Prolonged Time Billed NO      Greater than 50%  of this time was spent counseling and coordinating care related to the above assessment and plan.  Irean Hong, NP  Please contact Palliative Medicine Team phone at [phone] for questions and concerns.

## 2017-06-27 NOTE — Progress Notes (Signed)
INTERIM PROGRESS:  Paged by patient's nurse about concern for pain/SOB after going to the bathroom  Upon arrival, main complaint was diarrhea/cramping.  Specifically denied chest pain.   Did have mild IWB/tachypnea but said she was just so uncomfortable.   Her main request was immodium.   Claims 6-7 bowel movements overnight with no blood.  BP was 107-41 with pulse in 100s and RR ~21  Specifically denied chest pain.   Lung sounds were course  But no crackles to indicate edema after recent bolus x2 this morning.   No chronic antibiotics per patient or charting, started on vanc/zosyn today.  Will reoffer pain medication from this AM, immodium, order lipase.  Will also stop miralax and monitor with slight concern for C. Diff based on presentation but less likely due to lack of abx  Dr. Criss Rosales

## 2017-06-27 NOTE — Progress Notes (Signed)
Patients lactic 3.5 and Ca+ 5.2 Teaching Service MD paged and aware.

## 2017-06-27 NOTE — Progress Notes (Signed)
INTERIM PROGRESS  Paged by patient's nurse and went to see patient with Dr. Nori Riis.  Patient was hypotensive despite 3rd L of NS bolus and still expressing pain in abdomen that she described as crampy with diarrhea.    Plan is to consult CCM for potential transfer to ICU given potential need for pressors, step down if CCM declines transfer to ICU. Will withhold 4th bolus at this time given concern for pulmonary fluid overload 5mg  oxy IR x1 for abdominal pain  We appreciate CCM/Rapid Team for their care of this patient  -Dr. Criss Rosales

## 2017-06-27 NOTE — Progress Notes (Signed)
FPTS Interim Progress Note  Examined pt at bedside following platelet transfusion.  She reports she had some nausea earlier but is now feeling well.  No fever, rash, or other signs of transfusion reaction.  Epistaxis has stopped.  Physical exam unremarkable and she is mildly tachycardic to low 100's.   Per Dr. Marin Olp note, continuing Eliquis for anticoagulation at reduced dose of 2.5 mg BID.   Will continue to monitor.   Lovenia Kim, MD  PGY-2, [facility]

## 2017-06-27 NOTE — Progress Notes (Signed)
Pt heart rate went up to 120s in the bathroom this am. Pt stated her stomach was hurting at a 10/10 while on the toilet. She stated "it hurts, I am going to die." Pt took prn pain medication and pt stated that it resolved her pain. Pt is now resting comfortably in bed. Will continue to monitor.   Eleanora Neighbor, RN

## 2017-06-27 NOTE — Progress Notes (Signed)
INTERIM PROGRESS  Paged with critical lab values:   Will order 2G calcium gluconate over 2 hours with calcium lab @1800   -Dr. Criss Rosales

## 2017-06-27 NOTE — Progress Notes (Signed)
[name] apparently had a tough time last night.  She had a temperature of 102.2.  She is somewhat hypotensive.  She has hyponatremic.  His sodium is 120.  Her creatinine is 0.7.  Glucose is 251.  Her calcium is low at 6.2.  Her platelet count posttransfusion went up to 80.  Today is 42.  Hemoglobin is 10.7 white cell count 2.9.  She has some abdominal pain last night.  She had a little bit of diarrhea this morning.  Again, there is no bleeding.  There is no epistaxis.  She was cultured.  She is on IV antibiotics right now.  Does not look like she is going home.  I just feel bad for her.  She always has something going on.  There is no mouth sores.  On exam, her lungs sound clear.  Cardiac exam is regular rate and rhythm.  Abdomen is soft.  There is no guarding or rebound tenderness.  Extremities shows no clubbing, cyanosis or edema.  I appreciate the great care that she is getting from the staff up on 5 M.  Again, I do not think that she is able to go home today.  We will continue to follow her along.  [name] Haw, MD  Philippians 4:13

## 2017-06-27 NOTE — Progress Notes (Signed)
Hypotensive again despite 2 L bolus this morning, MD made aware.  Bolus and order to transfer placed. Rapid called to see patient. Bed request put in, orders followed, will continue to monitor

## 2017-06-27 NOTE — Progress Notes (Signed)
Called to place patient on RRT radar - came to floor to assist.  Catilyn RN at bedside - patient is arousable and oriented - tachypneic - tachycardic - and hypotensive.  She has received 2 liters of NS bolus today and BP's remain low 80's with increasing LA. Currently receiving her 3rd liter of NS with continued low BP.  Skin is warm and dry.  She has fluids and abx running via right portacath.  She endorses pain - abdomen - cramping.  She speaks in full sentences with some wheezing.  Dr. Criss Rosales and Dr. Nori Riis at bedside with [facility].  PCCM consulted per IM team.  4 pm:    3rd liter fluid finished - 92/50 with MAP of 60 - 3 hour repeat lactic acid sent.  Significant other at bedside states she is a little confused now with waking.  1645 - Patient sitting on edge of bed - increased WOB - BP remains low - now with decreased O2 sats - some crackles bil in bases posteriorly.  Placed on bedpan.  Updated Dr. Criss Rosales - he spoke with PCCM - patient has ready bed in ICU - will transfer now.  Caitlyn RN calling report.   Dr, Pati Gallo to bedside - transferred to [facility].  Handoff to Production assistant, radio.

## 2017-06-27 NOTE — Progress Notes (Addendum)
Family Medicine Teaching Service Daily Progress Note Intern Pager: [phone]  Patient name: [name] Medical record number: [id number] Date of birth: 24-Feb-1956 Age: 62 y.o. Gender: female  Primary Care Provider: Nuala Alpha, DO Consultants: hematology, palliative care  Code Status: Full   Pt Overview and Major Events to Date:  Admitted to [facility] on 06/26/17 for thrombocytopenia   Assessment and Plan: [name] is a 62 y.o. female presenting with thrombocytopenia . 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 F of 102.2. Patient meeting SIRS criteria (fever, tachypnea, tachycardia, leukopenia, hypotension) and qSOFA of 2. Patient immunocompromised given recent chemotherapy. Currently afebrile. LA 1.7 -will obtain blood and urine cx -UA pending  -will obtain CXR -will start vanc/zosyn for broad spectrum coverage -will give 1000cc NS fluid bolus x 2   Epistaxis 2/2 Thrombocytopenia  No longer having nose bleeds. Platelets improved to 80, 16 on admission. Hgb stable at 10.3. Likely multifactorial with recent initiation of chemotherapy along with likely bone marrow infiltration by malignancy. S/p 2 Units platelets. PTT of 30. -hematology consulted, appreciate recommendations: recommend half dose Eliquis at 2.5 mg bid, outpatient ENT follow up -blood smear pending  -monitor on daily CBC  Chest pain Patient reported chest tightness to RN (did not report any pain to me). Possible component of anxiety as patient was very tearful on exam.  -will trend troponins -STAT EKG  -will give prn Ativan   Recent PE with bilateral DVT Recently admitted in February for PE in RLL involving  segmental and more peripehral branches and found to have bilateral DVTs. Hypercoagulable given h/o malignancy. Patient was treated with Eliquis and was instructed to continue Eliquis 5mg  bid x 3 months.  -monitor for s/sx of PE -continue 1/2 dose Eliquis per hematology recommendations   Stage IV Right Lung Cancer Adenocarcinoma of unknown primary vs NSCLC with systemic bony metastatic disease. S/p 1 mo of radiation therapy (last ttx 2/21). She has also had a Port-A-Cath placed on right chest 2/20. Possible abdominal mets seen on CT abdomen on 2/13. No mets seen on brain MR 2/17. Receives 50-75 mcg of fentanyl via 72-hour patch and 20 oxycodone every 12 hours as needed, dilaudid 2mg  q4hrs prn. Chemotherapy initiated on 06/21/17 -Continue home pain regimen  -Continue with outpatient management -Bowel regimen Colace and MiraLAX -Wound care per nursing for Port-A-Cath incision -palliative care consultation, appreciate recommendations: recommend possible change in pain regimen, continue zofran with addition of compazine  Hypocalcemia Ca of 6.2 with albumin of 2.2. Corrected Ca 7.6.  -will recheck Ca on stat CMP -will replete as needed   Hyponatremia Na of 120, 126 on admission. Likely 2/2 poor PO intake.  -will initiate NS @ 75cc, goal to increase by 4-82meq in 24 hours  Systemic weakness, worsening Likely due to deconditioning in the setting of metastatic disease. -PT/OT -Nutrition consult  Chronic pancreatitis s/p biliary stenting, stable Likely due to alcohol abuse given patient's history per the chart. Patient has a biliary stent on 11/30. Has been endorsing abdominal pain however abdominal exam is unremarkable. Recent imaging on 2/13 CT abdominal pelvis did not show any acute findings consistent with pancreatitis.  -continue to monitor abdominal exam  Adjustment disorder with anxious features Acute.She did not have this before and it has  been less than 3 months of symptoms  according to the patient. Home meds: ativa 1mg  tid prn, mirtazapine 7.5mg  daily,  -continue home meds   Tobacco abuse -Nicotine replacement patches as needed -Nursing to provide smoking cessation counseling -encouraged smoking cessation given supplemental O2 use at home, patient reports that she no longer smokes since starting oxygen  -Patient continues to smoke while using supplemental O2 at home, will need aggressive counseling on dangers of smoking with O2 use  FEN/GI: regular diet, protonix 40mg  daily  Prophylaxis: SCDs  Disposition: continued inpatient stay, can consider transfer to stepdown if no improvement with fluid hydration   Subjective:  Patient very tearful. Very worried about what is happening to her. Patient denies SOB. States she is no longer bleeding. States she feels confused. No longer having nausea or vomiting   Objective: Temp:  [97.7 F (36.5 C)-102.2 F (39 C)] 98.3 F (36.8 C) (03/10 0931) Pulse Rate:  [96-127] 105 (03/10 0931) Resp:  [16-22] 22 (03/10 0500) BP: (84-149)/(43-69) 89/43 (03/10 0931) SpO2:  [92 %-100 %] 92 % (03/10 0931) Weight:  [148 lb (67.1 kg)] 148 lb (67.1 kg) (03/09 2127) Physical Exam: General: AAOx3, laying in bed, tearful, frail Cardiovascular: RRR, no MRG Respiratory: CTAB, no wheezes, rales, or rhonchi  Abdomen: soft, non tender, non distended, bowel sound normal  Extremities: no edema, non tender,   Laboratory: Recent Labs  Lab 06/26/17 0357 06/26/17 1847 06/27/17 0510  WBC 4.3 3.4* 2.9*  HGB 11.1* 10.3* 10.7*  HCT 33.4* 31.3* 32.3*  PLT 16* 80* 42*   Recent Labs  Lab 06/21/17 1130 06/26/17 0357 06/27/17 0510  NA 137 126* 120*  K 4.3 4.4 4.5  CL 99 92* 91*  CO2 27 23 19*  BUN 19 15 18   CREATININE 0.70 0.57 0.70  CALCIUM 8.9 6.6* 6.2*  PROT 6.9  --  5.7*  BILITOT 0.7  --  0.9  ALKPHOS 222*  --  191*  ALT 26  --  26  AST 28  --  23  GLUCOSE 250* 318* 251*    Ref. Range 06/27/2017 06:07  Lactic Acid,  Venous Latest Ref Range: 0.5 - 1.9 mmol/L 1.7    Imaging/Diagnostic Tests: Dg Chest 2 View  Result Date: 06/27/2017 CLINICAL DATA:  Fever. EXAM: CHEST - 2 VIEW COMPARISON:  Chest radiographs and CTA 06/13/2017 FINDINGS: Right jugular Port-A-Cath terminates over the lower SVC. The cardiac silhouette is normal in size. Aortic atherosclerosis is noted. There is a moderate-sized hiatal hernia. The lungs are hyperinflated with underlying emphysema. Peribronchial thickening and coarsened interstitial markings bilaterally are mildly increased compared to the prior radiographs with minimal patchy density in the left perihilar and right lower lungs. Trace pleural effusions are questioned. No pneumothorax is identified. IMPRESSION: COPD with mildly increased predominantly interstitial type densities bilaterally which may reflect superimposed acute infection, possibly atypical or viral. Electronically Signed   By: Logan Bores M.D.   On: 06/27/2017 07:51   Dg Chest 2 View  Result Date: 06/13/2017 CLINICAL DATA:  Shortness of breath.  History of lung cancer. EXAM: CHEST  2 VIEW COMPARISON:  August 01, 2014 FINDINGS: No pneumothorax. The heart, hila, and mediastinum are unremarkable. Small hiatal hernia. The nodularity in the right upper lung on the recent PET-CT is not appreciated on this study. IMPRESSION: No cause for shortness of breath or chest pain identified. The no nodularity in the right apex is not well assessed with this study. Electronically Signed   By: Dorise Bullion  III M.D   On: 06/13/2017 10:29   Ct Angio Chest Pe W And/or Wo Contrast  Result Date: 06/13/2017 CLINICAL DATA:  Shortness of breath.  Suspected PE. EXAM: CT ANGIOGRAPHY CHEST WITH CONTRAST TECHNIQUE: Multidetector CT imaging of the chest was performed using the standard protocol during bolus administration of intravenous contrast. Multiplanar CT image reconstructions and MIPs were obtained to evaluate the vascular anatomy. CONTRAST:   63mL ISOVUE-370 IOPAMIDOL (ISOVUE-370) INJECTION 76% COMPARISON:  Chest CT May 25, 2017. Chest x-ray June 13, 2017 FINDINGS: Cardiovascular: The heart and coronary arteries are unchanged. Atherosclerotic changes are seen in the nonaneurysmal aorta. 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 No dissection. There is narrowing of right upper lobe pulmonary arterial branches due to soft tissue in the right hilum. Pulmonary emboli are seen in the right lung bases involving segmental and more peripheral branches. No other emboli identified. Mediastinum/Nodes: There is soft tissue in the right hilum which encases the right upper lobe bronchus and upper lobe pulmonary arterial branches. This soft tissue measures up to 2.8 cm. No other adenopathy. There is a moderate hiatal hernia. The thyroid is normal. A Port-A-Cath is identified. No effusions. Lungs/Pleura: The spiculated nodule in the right upper lobe measures 1.6 by 1.0 cm today versus 1.4 x 0.9 cm previously. The small difference could be due to difference in slice selection given the short interval. Scattered scarring. Stable 5 mm nodule in the right lung on series 6, image 77. Other nodules are stable as well. No new masses. Mild ground-glass in the medial right lung on series 6, image 102 is new and could be infectious. No new nodules. No other infiltrates. Upper Abdomen: Air in the liver is stable, likely from previous sphincterotomy. Prominence of the left adrenal gland is likely hyperplasia without nodularity. No other abnormalities in the upper abdomen. Musculoskeletal: Bony metastatic disease again identified in the spine, manubrium, sternum, and ribs. Review of the MIP images confirms the above findings. IMPRESSION: 1. Pulmonary emboli in the right lower lobe involving segmental and more peripheral branches. 2. Mild ground-glass in the medial right lung could be infectious. 3. The spiculated nodule in the right upper lobe is not significantly  changed accounting for difference in technique. 4. Increasing soft tissue in the right hilum with encasement of the right upper lobe bronchus and right upper lobe pulmonary arterial branches resulting in some narrowing. This is suspicious for disease involvement. 5. Known bony metastatic disease. 6. Atherosclerotic changes in the nonaneurysmal aorta. 7. Moderate hiatal hernia. Findings called to and discussed with Dr. Ashok Cordia Aortic Atherosclerosis (ICD10-I70.0). Electronically Signed   By: Dorise Bullion III M.D   On: 06/13/2017 13:30   Mr Brain W Wo Contrast  Result Date: 06/06/2017 CLINICAL DATA:  Non-small cell lung cancer staging EXAM: MRI HEAD WITHOUT AND WITH CONTRAST TECHNIQUE: Multiplanar, multiecho pulse sequences of the brain and surrounding structures were obtained without and with intravenous contrast. CONTRAST:  62mL MULTIHANCE GADOBENATE DIMEGLUMINE 529 MG/ML IV SOLN COMPARISON:  Head CT 03/02/2010 FINDINGS: Brain: The midline structures are normal. There is no acute infarct or acute hemorrhage. No mass lesion, hydrocephalus, dural abnormality or extra-axial collection. The brain parenchymal signal is normal. No age-advanced or lobar predominant atrophy. No chronic microhemorrhage or superficial siderosis. Vascular: Major intracranial arterial and venous sinus flow voids are preserved. Skull and upper cervical spine: There are multiple T1 hyperintense foci within the calvarium, the largest in the right parietal bone. This also shows intrinsic T2 hyperintensity, likely a hemangioma. Sinuses/Orbits:  Mild left sphenoid sinus mucosal thickening. No mastoid or middle ear effusion. Normal orbits. IMPRESSION: 1. No intracranial metastatic disease.  Normal brain. 2. 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 Electronically Signed   By: Ulyses Jarred M.D.   On: 06/06/2017 01:26   Ct Abdomen Pelvis W Contrast  Result Date: 06/02/2017 CLINICAL DATA:   Left upper quadrant abdominal pain and distention. Recently diagnosed with metastatic adenocarcinoma of unknown primary. Evaluate for diverticulitis. EXAM: CT ABDOMEN AND PELVIS WITH CONTRAST TECHNIQUE: Multidetector CT imaging of the abdomen and pelvis was performed using the standard protocol following bolus administration of intravenous contrast. CONTRAST:  142mL ISOVUE-300 IOPAMIDOL (ISOVUE-300) INJECTION 61% COMPARISON:  PET-CT 05/26/2017. Abdominopelvic CT 05/25/2017 and 03/25/2016. FINDINGS: Lower chest: Patchy ground-glass opacities at both lung bases are stable. There is no significant pleural effusion. Moderate-sized hiatal hernia again noted. Hepatobiliary: The liver is normal in density without focal abnormality. Stable mild biliary dilatation and pneumobilia status post cholecystectomy. Pancreas: Multiple calcifications are present within the pancreatic head. There is a pancreatic stent in place with a small amount of air in the main pancreatic duct. The pancreatic body and tail appear normal. No acute surrounding inflammation. Spleen: Scattered small granulomas.  Normal in size. Adrenals/Urinary Tract: Stable generalized enlargement of the left adrenal gland consistent with hyperplasia. The right adrenal gland appears normal. Tiny low-density left renal lesions remain too small to characterize, but stable. No hydronephrosis or urinary tract calculus. The bladder appears normal. Stomach/Bowel: As above, moderate-size hiatal hernia. The stomach and small bowel appear normal. The proximal colon is fluid-filled. There are diverticular changes of the sigmoid colon with a fluid-filled inferiorly projecting diverticulum on axial image 64. There may be minimal inflammation in the surrounding fat. No evidence of bowel obstruction. Vascular/Lymphatic: There are no enlarged abdominal or pelvic lymph nodes. Scattered small retroperitoneal lymph nodes are stable. There is diffuse aortic and branch vessel  atherosclerosis. No acute vascular findings. Reproductive: The uterus and ovaries appear normal. No adnexal mass. Probable pelvic floor laxity. Other: There is an enhancing soft tissue nodule in the anterior abdominal wall near the umbilicus, measuring 16 mm on image 41. Mildly hypermetabolic on recent PET-CT, probably a metastasis. No ascites, free air or peritoneal nodularity. Musculoskeletal: Multifocal osseous metastatic disease, similar to recent studies. No pathologic fracture identified. IMPRESSION: 1. Possible mild sigmoid diverticulitis. No evidence of perforation, abscess or obstruction. 2. No other evidence of acute process. 3. Known metastatic disease to the bones. Probable enhancing metastasis within the anterior abdominal wall near the umbilicus. 4. Stable incidental findings including a moderate size hiatal hernia, mild biliary dilatation status post cholecystectomy, sequela of chronic calcific pancreatitis, left adrenal hyperplasia and Aortic Atherosclerosis (ICD10-I70.0). Electronically Signed   By: Richardean Sale M.D.   On: 06/02/2017 15:48   Ir US Guide Vasc Access Right  Result Date: 06/09/2017 CLINICAL DATA:  Lung cancer EXAM: TUNNEL POWER PORT PLACEMENT WITH SUBCUTANEOUS POCKET UTILIZING ULTRASOUND & FLOUROSCOPY FLUOROSCOPY TIME:  30 seconds.  Five mGy. MEDICATIONS AND MEDICAL HISTORY: Versed 4 mg, Fentanyl 100 mcg. Additional Medications: Ancef 2 g. Antibiotics were given within 2 hours of the procedure. ANESTHESIA/SEDATION: Moderate sedation time: 26 minutes. Nursing monitored the the patient during the procedure. PROCEDURE: After written informed consent was obtained, patient was placed in the supine position on angiographic table. The right neck and chest was prepped and draped in a sterile fashion. Lidocaine was utilized for local anesthesia. The right jugular vein was noted to be  patent initially with ultrasound. Under sonographic guidance, a micropuncture needle was inserted into  the right IJ vein (Ultrasound and fluoroscopic image documentation was performed). The needle was removed over an 018 wire which was exchanged for a Amplatz. This was advanced into the IVC. An 8-French dilator was advanced over the Amplatz. A small incision was made in the right upper chest over the anterior right second rib. Utilizing blunt dissection, a subcutaneous pocket was created in the caudal direction. The pocket was irrigated with a copious amount of sterile normal saline. The port catheter was tunneled from the chest incision, and out the neck incision. The reservoir was inserted into the subcutaneous pocket and secured with two 3-0 Ethilon stitches. A peel-away sheath was advanced over the Amplatz wire. The port catheter was cut to measure length and inserted through the peel-away sheath. The peel-away sheath was removed. The chest incision was closed with 3-0 Vicryl interrupted stitches for the subcutaneous tissue and a running of 4-0 Vicryl subcuticular stitch for the skin. The neck incision was closed with a 4-0 Vicryl subcuticular stitch. Derma-bond was applied to both surgical incisions. The port reservoir was flushed and instilled with heparinized saline. No complications. FINDINGS: A right IJ vein Port-A-Cath is in place with its tip at the cavoatrial junction. COMPLICATIONS: None IMPRESSION: Successful 8 French right internal jugular vein power port placement with its tip at the SVC/RA junction. Electronically Signed   By: Marybelle Killings M.D.   On: 06/09/2017 16:41   Mm Screening Breast Tomo Bilateral  Result Date: 06/10/2017 CLINICAL DATA:  Screening. EXAM: DIGITAL SCREENING BILATERAL MAMMOGRAM WITH TOMO AND CAD COMPARISON:  Previous exam(s). ACR Breast Density Category c: The breast tissue is heterogeneously dense, which may obscure small masses. FINDINGS: There are no findings suspicious for malignancy. Images were processed with CAD. IMPRESSION: No mammographic evidence of malignancy. A  result letter of this screening mammogram will be mailed directly to the patient. RECOMMENDATION: Screening mammogram in one year. (Code:[id number]) BI-RADS CATEGORY  1: Negative. Electronically Signed   By: Lajean Manes M.D.   On: 06/10/2017 09:40   Ir Fluoro Guide Port Insertion Right  Result Date: 06/09/2017 CLINICAL DATA:  Lung cancer EXAM: TUNNEL POWER PORT PLACEMENT WITH SUBCUTANEOUS POCKET UTILIZING ULTRASOUND & FLOUROSCOPY FLUOROSCOPY TIME:  30 seconds.  Five mGy. MEDICATIONS AND MEDICAL HISTORY: Versed 4 mg, Fentanyl 100 mcg. Additional Medications: Ancef 2 g. Antibiotics were given within 2 hours of the procedure. ANESTHESIA/SEDATION: Moderate sedation time: 26 minutes. Nursing monitored the the patient during the procedure. PROCEDURE: After written informed consent was obtained, patient was placed in the supine position on angiographic table. The right neck and chest was prepped and draped in a sterile fashion. Lidocaine was utilized for local anesthesia. The right jugular vein was noted to be patent initially with ultrasound. Under sonographic guidance, a micropuncture needle was inserted into the right IJ vein (Ultrasound and fluoroscopic image documentation was performed). The needle was removed over an 018 wire which was exchanged for a Amplatz. This was advanced into the IVC. An 8-French dilator was advanced over the Amplatz. A small incision was made in the right upper chest over the anterior right second rib. Utilizing blunt dissection, a subcutaneous pocket was created in the caudal direction. The pocket was irrigated with a copious amount of sterile normal saline. The port catheter was tunneled from the chest incision, and out the neck incision. The reservoir was inserted into the subcutaneous pocket and secured with two 3-0 Ethilon stitches. A  peel-away sheath was advanced over the Amplatz wire. The port catheter was cut to measure length and inserted through the peel-away sheath. The  peel-away sheath was removed. The chest incision was closed with 3-0 Vicryl interrupted stitches for the subcutaneous tissue and a running of 4-0 Vicryl subcuticular stitch for the skin. The neck incision was closed with a 4-0 Vicryl subcuticular stitch. Derma-bond was applied to both surgical incisions. The port reservoir was flushed and instilled with heparinized saline. No complications. FINDINGS: A right IJ vein Port-A-Cath is in place with its tip at the cavoatrial junction. COMPLICATIONS: None IMPRESSION: Successful 8 French right internal jugular vein power port placement with its tip at the SVC/RA junction. Electronically Signed   By: Marybelle Killings M.D.   On: 06/09/2017 16:41     Caroline More, DO 06/27/2017, 9:47 AM PGY-1, [place] Intern pager: [phone], text pages welcome

## 2017-06-27 NOTE — Consult Note (Addendum)
Chart reviewed, patient seen and examined.  [name], [name] Female, 62 y.o., [birth year]  Chief Complaint  Patient presents with  . Epistaxis   06-27-17 developed fever, hypotension, tachypnea - icu called for transfer to manage septic shock    62 y/o female, recently diagnosed with metastatic adeno ca, s/p chemoRx 1 wk ago, admitted with severe thrombocytopenia>>epistaxis; on Eliquis for RLL PE and bilateral LE DVT. Today she developed SIRS-> sepsis recvd 3 lit NS fluids; BP around 90 systolic; more confused and irritable. Mouth feels dry; coughing without expectoration. No reports of hemoptysis, vomiting; had chronic diarrhea.  Started on [place]. Followed by Oncology and Palliative Care.  Full code status till now.  Boyfriend-Steve- has POA-  Is at bedside.       has a past medical history of Abdominal pain (06/02/2017), Abdominal pain, right upper quadrant (04/03/2016), Acute dyspnea, Alcohol abuse, Alcohol withdrawal ([place]) (10/02/2013), Anxiety, Cancer ([place]), Chronic diarrhea, Constipation, Depression, Elevated liver function tests (few yrs ago), Fatty liver (10/11/2011), FATTY LIVER DISEASE (10/01/2008), Gastric ulcer (01/20/2012), HIATAL HERNIA (10/01/2008), Incisional hernia s/p lap/open repair with mesh 07/09/2016 (07/09/2016), Major depressive disorder, recurrent, severe without psychotic features ([place]), Metastatic lung cancer (metastasis from lung to other site), right ([place]) (06/01/2017), Pancreatic calcification (04/03/2016), Pancreatic pain (03/31/2017), Pancreatitis, PONV (postoperative nausea and vomiting), Pseudocyst of pancreas (09/29/2011), Rosacea, Sciatica of right side (02/25/2017), and Wears glasses.   has a past surgical history that includes Cholecystectomy; Shoulder surgery (Left, 02/18/2010); DIAGNOSTIC MAMMOGRAM (2008); Tubal ligation; Colonoscopy (Never); Esophagogastroduodenoscopy (03/21/2011); ERCP (10/06/2011); laparotomy (01/03/2012);  Esophagogastroduodenoscopy (01/06/2012); pancreatic cyst removed  (2015); Ventral hernia repair (N/A, 07/09/2016); Insertion of mesh (07/09/2016); Hernia repair; IR FLUORO GUIDE PORT INSERTION RIGHT (06/09/2017); and IR US Guide Vasc Access Right (06/09/2017).  Family History: + Cardiovascular dz  Social History: smoker, + ethanol use  . apixaban  2.5 mg Oral BID  . feeding supplement (ENSURE ENLIVE)  237 mL Oral BID BM  . fentaNYL  75 mcg Transdermal Q72H  . folic acid  1 mg Oral Daily  . gabapentin  300 mg Oral BID  . hydrocortisone sod succinate (SOLU-CORTEF) inj  50 mg Intravenous Q6H  . lidocaine  1 patch Transdermal Q24H  . mirtazapine  7.5 mg Oral QHS  . nicotine  14 mg Transdermal Daily  . oxyCODONE  5 mg Oral Once  . oxyCODONE  20 mg Oral Q12H  . pantoprazole  40 mg Oral Daily  . sodium chloride flush  10-40 mL Intracatheter Q12H   Current Facility-Administered Medications:  .  0.9 %  sodium chloride infusion, , Intravenous, Continuous, Elwood Bing, DO, Last Rate: 100 mL/hr at 06/27/17 1328 .  acetaminophen (TYLENOL) tablet 650 mg, 650 mg, Oral, Q6H PRN, 650 mg at 06/27/17 0526 **OR** acetaminophen (TYLENOL) suppository 650 mg, 650 mg, Rectal, Q6H PRN, Caroline More, DO .  apixaban (ELIQUIS) tablet 2.5 mg, 2.5 mg, Oral, BID, Volanda Napoleon, MD, 2.5 mg at 06/27/17 1050 .  docusate sodium (COLACE) capsule 100 mg, 100 mg, Oral, BID PRN, Borders, Vonna Kotyk R, NP .  feeding supplement (ENSURE ENLIVE) (ENSURE ENLIVE) liquid 237 mL, 237 mL, Oral, BID BM, Abraham, Sherin, DO, 237 mL at 06/27/17 1328 .  fentaNYL (DURAGESIC - dosed mcg/hr) patch 75 mcg, 75 mcg, Transdermal, Q72H, Dickie La, MD, 75 mcg at 06/27/17 0011 .  folic acid (FOLVITE) tablet 1 mg, 1 mg, Oral, Daily, Abraham, Sherin, DO, 1 mg at 06/27/17 1050 .  gabapentin (NEURONTIN) tablet 300 mg, 300 mg, Oral,  BID, Tammi Klippel, Sherin, DO, 300 mg at 06/27/17 1050 .  guaiFENesin-dextromethorphan (ROBITUSSIN DM) 100-10 MG/5ML  syrup 5 mL, 5 mL, Oral, Q4H PRN, Dickie La, MD, 5 mL at 06/26/17 0953 .  hydrocortisone sodium succinate (SOLU-CORTEF) 100 MG injection 50 mg, 50 mg, Intravenous, Q6H, [place] Reta P, MD .  HYDROmorphone (DILAUDID) tablet 2 mg, 2 mg, Oral, Q4H PRN, Caroline More, DO, 2 mg at 06/27/17 0426 .  ipratropium-albuterol (DUONEB) 0.5-2.5 (3) MG/3ML nebulizer solution 3 mL, 3 mL, Nebulization, Q6H PRN, Tammi Klippel, Sherin, DO .  lidocaine (LIDODERM) 5 % 1 patch, 1 patch, Transdermal, Q24H, Abraham, Sherin, DO, 1 patch at 06/27/17 1048 .  loperamide (IMODIUM) capsule 2 mg, 2 mg, Oral, Q12H PRN, Criss Rosales, Scott, DO, 2 mg at 06/27/17 1326 .  LORazepam (ATIVAN) tablet 1 mg, 1 mg, Oral, QID PRN, Caroline More, DO, 1 mg at 06/27/17 0939 .  mirtazapine (REMERON) tablet 7.5 mg, 7.5 mg, Oral, QHS, Abraham, Sherin, DO, 7.5 mg at 06/26/17 2142 .  nicotine (NICODERM CQ - dosed in mg/24 hours) patch 14 mg, 14 mg, Transdermal, Daily, Abraham, Sherin, DO, 14 mg at 06/27/17 1048 .  ondansetron (ZOFRAN) injection 4 mg, 4 mg, Intravenous, Q6H PRN, Shirley, [place], DO, 4 mg at 06/27/17 1327 .  oxyCODONE (Oxy IR/ROXICODONE) immediate release tablet 5 mg, 5 mg, Oral, Once, Bland, Scott, DO .  oxyCODONE (OXYCONTIN) 12 hr tablet 20 mg, 20 mg, Oral, Q12H, Abraham, Sherin, DO, 20 mg at 06/27/17 1326 .  pantoprazole (PROTONIX) EC tablet 40 mg, 40 mg, Oral, Daily, Abraham, Sherin, DO, 40 mg at 06/27/17 1049 .  piperacillin-tazobactam (ZOSYN) IVPB 3.375 g, 3.375 g, Intravenous, Q8H, Dickie La, MD, Last Rate: 12.5 mL/hr at 06/27/17 1517, 3.375 g at 06/27/17 1517 .  prochlorperazine (COMPAZINE) injection 5 mg, 5 mg, Intravenous, Q6H PRN, Shirley, [place], DO, 5 mg at 06/26/17 1830 .  ramelteon (ROZEREM) tablet 8 mg, 8 mg, Oral, QHS PRN, Tammi Klippel, Sherin, DO .  sodium chloride flush (NS) 0.9 % injection 10-40 mL, 10-40 mL, Intracatheter, Q12H, Dickie La, MD .  vancomycin (VANCOCIN) IVPB 750 mg/150 ml premix, 750 mg, Intravenous,  Q12H, Dickie La, MD       Review of Systems  Unable to perform ROS: Mental status change     Physical Exam: Blood pressure (!) 85/55, pulse (!) 115, temperature 99.3 F (37.4 C), temperature source Oral, resp. rate (!) 36, height 5\' 6"  (1.676 m), weight 148 lb (67.1 kg), last menstrual period 12/08/2006, SpO2 (!) 88 %.  General appearance  Ill looking, confused , NAD   HEENT +pallor  No nasal congestion/ no epistaxis No tongue swelling No swelling in the neck No Oral thrush Dry oral mucosa     Cardiovascular S1 and S2 regular no Murmur/gallop  R cw mediport  SITE  DOES NOT LOOK INFECTED    Respiratory Chest expansion equal Breath sounds equal and coarse No rhonchi or rales     Abdomen   Soft,Nontender, nondistended No organomegaly,No mass Bowel sounds+   Extremities No swelling,No tenderness No clubbing Capillary refill < 2 sec Peripheral pulses  Neurological Lethargic, responsive, "asking for water' Pupils equal and reacting No facial asymmetry Tone normal Grossly intact   Labs  Results for MAKILAH, DOWDA (MRN [id number]) as of 06/27/2017 17:29  Ref. Range 06/27/2017 12:39 06/27/2017 15:35 06/27/2017 15:39 06/27/2017 15:56  Troponin I Latest Ref Range: <0.03 ng/mL 0.03 (HH) 0.03 (HH)    Lactic Acid, Venous Latest Ref Range: 0.5 - 1.9  mmol/L 3.5 (HH)   2.8 [facility])  Results for DHARMA, PARE (MRN [id number]) as of 06/27/2017 17:29  Ref. Range 06/27/2017 12:39 06/27/2017 15:35  Sodium Latest Ref Range: 135 - 145 mmol/L 123 (L)   Potassium Latest Ref Range: 3.5 - 5.1 mmol/L 4.1   Chloride Latest Ref Range: 101 - 111 mmol/L 98 (L)   CO2 Latest Ref Range: 22 - 32 mmol/L 16 (L)   Glucose Latest Ref Range: 65 - 99 mg/dL 191 (H)   BUN Latest Ref Range: 6 - 20 mg/dL 15   Creatinine Latest Ref Range: 0.44 - 1.00 mg/dL 0.77   Calcium Latest Ref Range: 8.9 - 10.3 mg/dL 5.2 (LL)   Anion gap Latest Ref Range: 5 - 15  9   Alkaline Phosphatase Latest Ref Range: 38  - 126 U/L 170 (H)   Albumin Latest Ref Range: 3.5 - 5.0 g/dL 1.8 (L)   Lipase Latest Ref Range: 11 - 51 U/L  16  AST Latest Ref Range: 15 - 41 U/L 27   ALT Latest Ref Range: 14 - 54 U/L 22   Total Protein Latest Ref Range: 6.5 - 8.1 g/dL 4.8 (L)   Total Bilirubin Latest Ref Range: 0.3 - 1.2 mg/dL 0.8   GFR, Est Non African American Latest Ref Range: >60 mL/min >60   GFR, Est African American Latest Ref Range: >60 mL/min >60     Results for VANESSIA, BOKHARI (MRN [id number]) as of 06/27/2017 17:29  Ref. Range 06/27/2017 15:39  WBC Latest Ref Range: 4.0 - 10.5 K/uL 3.4 (L)  RBC Latest Ref Range: 3.87 - 5.11 MIL/uL 3.21 (L)  Hemoglobin Latest Ref Range: 12.0 - 15.0 g/dL 9.1 (L)  HCT Latest Ref Range: 36.0 - 46.0 % 27.1 (L)  MCV Latest Ref Range: 78.0 - 100.0 fL 84.4  MCH Latest Ref Range: 26.0 - 34.0 pg 28.3  MCHC Latest Ref Range: 30.0 - 36.0 g/dL 33.6  RDW Latest Ref Range: 11.5 - 15.5 % 16.8 (H)  Platelets Latest Ref Range: 150 - 400 K/uL 22 (LL)     POCUS- 06-27-2017 IVC is COLLAPSIBLE HYPERDYNAMIC LV POOR VIEWS T COMMENT ON RV ENLARGEMENT OR PERICARDIAL EFFUSION   06-02-17 CT abdomen 1. Possible mild sigmoid diverticulitis. No evidence of perforation, abscess or obstruction. 2. No other evidence of acute process. 3. Known metastatic disease to the bones. Probable enhancing metastasis within the anterior abdominal wall near the umbilicus. 4. Stable incidental findings including a moderate size hiatal hernia, mild biliary dilatation status post cholecystectomy, sequela of chronic calcific pancreatitis, left adrenal hyperplasia and Aortic Atherosclerosis (ICD10-I70.0).   CT scan chest  06-13-17 1. Pulmonary emboli in the right lower lobe involving segmental and more peripheral branches. 2. Mild ground-glass in the medial right lung could be infectious. 3. The spiculated nodule in the right upper lobe is not significantly changed accounting for difference in technique. 4.  Increasing soft tissue in the right hilum with encasement of the right upper lobe bronchus and right upper lobe pulmonary arterial branches resulting in some narrowing. This is suspicious for disease involvement. 5. Known bony metastatic disease. 6. Atherosclerotic changes in the nonaneurysmal aorta. 7. Moderate hiatal hernia.     06-27-17 Chest xray Right jugular Port-A-Cath terminates over the lower SVC. The cardiac silhouette is normal in size. Aortic atherosclerosis is noted. There is a moderate-sized hiatal hernia. The lungs are hyperinflated with underlying emphysema. Peribronchial thickening and coarsened interstitial markings bilaterally are mildly increased compared to the prior radiographs with minimal  patchy density in the left perihilar and right lower lungs. Trace pleural effusions are questioned. No pneumothorax is identified.  Ekg 06-27-17 Sinus tachycardia  Venous leg duplex 06-14-17 Right: There is evidence of acute DVT in the Peroneal veins, and Soleal vein. No cystic structure found in the popliteal fossa. Left: There is evidence of acute DVT in the Posterior Tibial veins, Peroneal veins, Soleal vein, and Gastrocnemius vein. No cystic structure found in the popliteal fossa. MR brain 06-06-17  1. No intracranial metastatic disease.  Normal brain. 2. 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  PET scan 06-05-17 1. Widespread skeletal metastasis within the spine, pelvis as well as proximal appendicular skeleton. These lesions have mild to moderate radiotracer activity which is atypical for metastatic disease (typically more intense). 2. Nodular lesion in the RIGHT upper lobe has mild metabolic activity. Typically would favor this lesions to represent benign scarring; however with low metabolic activity of skeletal metastasis this potentially could represent a site of primary neoplasm.     04-26-17  Pathology Bone, biopsy - METASTATIC ADENOCARCINOMA. - SEE COMMENT. Microscopic Comment The malignant cells are positive for cytokeratin 7. They are negative for GATA-3, cytokeratin 20, estrogen receptor, TTF-1, CDX-2, Napsin-A and GCDFP. The immunohistochemical profile is non specific. However, cytokeratin 7 positive/cytokeratin 20 negative carcinoma may be seen from primary breast, upper GI and pancreatobiliary. Clinical and radiologic correlation is necessary.       Assessment and Plan:    62    Y/o    Female,        With    H/o recently diagnosed metastatic adenocarcinoma ?primary, extensive skeletal mets, s/p 1 cycle chemoTherapy (agents unknown), admitted with severe thrombocytopenia, has pancytopenia. Has features of severe sepsis; LA was 3.7 and now trending down with fluids. Started on broad antibx. Source unknown. Mediport was placed in Feb 2019       Neurology Likely septic encephalopathy MR brain in 05/2017 did not reveal mets.  Still at risk for mets.  Would  Do CT head  Cardiology  Fluid resuscitation- NS AT 150ML/HR  Stress dose steroids norepi to keep MAP >45mmHg if needed  Pulmonary  02 prn- keep sp02 >92% Bronchodilators   GI /nutrition  Diet as tolerated  Renal, fluids, electrolytes Monitor lytes, renal function Hyponatremia likely from hypovolemia  Hematology- Oncology Therapeutically ACd. That covers DVT prophylx. 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 Goals of care discussion with boyfriend . Son not here now. Poor prognosis in the setting of sepsis  Post chemotherapy and stage IV adenoca. Palliative care following   Endocrinology Monitor sugars On stress dose steroids now  Infectious diseases F/u c/s data   Lines -Devices R mediport   Counseled [name]- boyfriend; discussed code status; Options of aggressive medical R x, vs invasive Rx with CPR, Vent at end of life   Were reviewed. After some thought , he confirmed that pt would not  have wanted aggressive extreme life sustaining measures if she sustained CardioPulm arrest. He likes for her to be  DNR/DNI    d/w Dr Criss Rosales- Housestaff.Dr Criss Rosales And FP- Dr Mallie Mussel   Thank you for letting me participate in the care of your patient.  ^^^^^^^^^^ I  Have personally spent  60   Minutes  In the care of this Patient providing Critical care Services; Time includes review of chart, labs, imaging, coordinating care with other physicians and healthcare team members. Also includes time for frequent reevaluation and additional  treatment implementation due to change in clinical condiiton of patient. Excludes time spent for Procedure and Teaching.   ^^^^^^^^^^  Note subject to typographical and grammatical errors;   Any formal questions or concerns about the content, text, or information contained within the body of this dictation should be directly addressed to the physician  for  clarification.   Evans Lance, MD Pulmonary and [facility] Pager: [phone]

## 2017-06-27 NOTE — Progress Notes (Signed)
INTERIM PROGRESS:  Patient being transferred to ICU with concerns for sepsis given hypotension, tachycardia, tachypnia s/p 3 bolus of NS.  She is mentally confused at this time so Dr. Pati Gallo and I clarified goals of care with her boyfriend who is healthcare POA.   He states clearly that she did not want life prolonging measures if there was likely to be no full recovery to meaningful functionality.   He states this was expressed in a prior advanced directive which we found logged in our media tab on 06/18/17 in epic.  Patient will be transferred to ICU care and continue treatment while her code status is being updated to match her expressed wishes.  She is now listed as DNR.  -Dr. Criss Rosales

## 2017-06-27 NOTE — Progress Notes (Signed)
Pharmacy Antibiotic Note  [name] is a 62 y.o. female admitted on 06/26/2017 with sepsis.  Pharmacy has been consulted for vancomycin and Zosyn dosing.  Tmax 102.2 on admission. Blood cultures pending.  WBC 2.9, LA 1.7.  Plan: Vancomycin 1.5 g IV x 1, then 750 mg every 12 hours (goal trough 15-20 mcg/mL) Zosyn 3.375g IV q8h (4 hour infusion) Monitor cultures, s/sx, and LOT.  Height: 5\' 6"  (167.6 cm) Weight: 148 lb (67.1 kg) IBW/kg (Calculated) : 59.3  Temp (24hrs), Avg:98.9 F (37.2 C), Min:97.7 F (36.5 C), Max:102.2 F (39 C)  Recent Labs  Lab 06/21/17 1130 06/26/17 0357 06/26/17 1847 06/27/17 0510 06/27/17 0607  WBC 20.7* 4.3 3.4* 2.9*  --   CREATININE 0.70 0.57  --  0.70  --   LATICACIDVEN  --   --   --   --  1.7    Estimated Creatinine Clearance: 68.3 mL/min (by C-G formula based on SCr of 0.7 mg/dL).    Allergies  Allergen Reactions  . Ciprofloxacin Nausea Only    Antimicrobials this admission: Vancomycin 3/10 >> Zosyn 3/10 >>  Dose adjustments this admission: n/a  Microbiology results: 3/10 Bcx: sent  Thank you for allowing pharmacy to be a part of this patient's care.  Orma Render, PharmD [phone] 06/27/2017 9:06 AM

## 2017-06-27 NOTE — Progress Notes (Signed)
CRITICAL VALUE ALERT  Critical Value:  Calcium 6.2  Date & Time Notied:  06/27/2017  5208  Provider Notified: FMTS  Orders Received/Actions taken: MD wants to recheck lab value first at this time STAT.  Eleanora Neighbor, RN

## 2017-06-28 ENCOUNTER — Inpatient Hospital Stay (HOSPITAL_COMMUNITY): Payer: BLUE CROSS/BLUE SHIELD

## 2017-06-28 ENCOUNTER — Other Ambulatory Visit: Payer: Self-pay

## 2017-06-28 DIAGNOSIS — R7881 Bacteremia: Secondary | ICD-10-CM

## 2017-06-28 DIAGNOSIS — A419 Sepsis, unspecified organism: Secondary | ICD-10-CM

## 2017-06-28 DIAGNOSIS — R0902 Hypoxemia: Secondary | ICD-10-CM

## 2017-06-28 DIAGNOSIS — A4152 Sepsis due to Pseudomonas: Secondary | ICD-10-CM

## 2017-06-28 LAB — BLOOD CULTURE ID PANEL (REFLEXED)
Acinetobacter baumannii: NOT DETECTED
CANDIDA KRUSEI: NOT DETECTED
CANDIDA TROPICALIS: NOT DETECTED
CARBAPENEM RESISTANCE: NOT DETECTED
Candida albicans: NOT DETECTED
Candida glabrata: NOT DETECTED
Candida parapsilosis: NOT DETECTED
ENTEROCOCCUS SPECIES: NOT DETECTED
Enterobacter cloacae complex: NOT DETECTED
Enterobacteriaceae species: NOT DETECTED
Escherichia coli: NOT DETECTED
Haemophilus influenzae: NOT DETECTED
KLEBSIELLA OXYTOCA: NOT DETECTED
Klebsiella pneumoniae: NOT DETECTED
LISTERIA MONOCYTOGENES: NOT DETECTED
NEISSERIA MENINGITIDIS: NOT DETECTED
Proteus species: NOT DETECTED
Pseudomonas aeruginosa: DETECTED — AB
SERRATIA MARCESCENS: NOT DETECTED
STAPHYLOCOCCUS SPECIES: NOT DETECTED
STREPTOCOCCUS PNEUMONIAE: NOT DETECTED
Staphylococcus aureus (BCID): NOT DETECTED
Streptococcus agalactiae: NOT DETECTED
Streptococcus pyogenes: NOT DETECTED
Streptococcus species: NOT DETECTED

## 2017-06-28 LAB — PATHOLOGIST SMEAR REVIEW

## 2017-06-28 LAB — COMPREHENSIVE METABOLIC PANEL
ALK PHOS: 151 U/L — AB (ref 38–126)
ALT: 37 U/L (ref 14–54)
ANION GAP: 9 (ref 5–15)
AST: 48 U/L — ABNORMAL HIGH (ref 15–41)
Albumin: 1.8 g/dL — ABNORMAL LOW (ref 3.5–5.0)
BILIRUBIN TOTAL: 0.4 mg/dL (ref 0.3–1.2)
BUN: 12 mg/dL (ref 6–20)
CALCIUM: 5.4 mg/dL — AB (ref 8.9–10.3)
CO2: 17 mmol/L — ABNORMAL LOW (ref 22–32)
CREATININE: 0.83 mg/dL (ref 0.44–1.00)
Chloride: 102 mmol/L (ref 101–111)
GFR calc non Af Amer: >60 mL/min (ref >60)
Glucose, Bld: 236 mg/dL — ABNORMAL HIGH (ref 65–99)
Potassium: 3.6 mmol/L (ref 3.5–5.1)
Sodium: 128 mmol/L — ABNORMAL LOW (ref 135–145)
TOTAL PROTEIN: 5.4 g/dL — AB (ref 6.5–8.1)

## 2017-06-28 LAB — CBC WITH DIFFERENTIAL/PLATELET
BASOS ABS: 0 10*3/uL (ref 0.0–0.1)
BASOS PCT: 0 %
EOS ABS: 0 10*3/uL (ref 0.0–0.7)
Eosinophils Relative: 0 %
HCT: 27.7 % — ABNORMAL LOW (ref 36.0–46.0)
Hemoglobin: 9.4 g/dL — ABNORMAL LOW (ref 12.0–15.0)
LYMPHS PCT: 4 %
Lymphs Abs: 0.2 10*3/uL — ABNORMAL LOW (ref 0.7–4.0)
MCH: 28.6 pg (ref 26.0–34.0)
MCHC: 33.9 g/dL (ref 30.0–36.0)
MCV: 84.2 fL (ref 78.0–100.0)
MONO ABS: 0 10*3/uL — AB (ref 0.1–1.0)
Monocytes Relative: 0 %
NEUTROS PCT: 96 %
Neutro Abs: 4.8 10*3/uL (ref 1.7–7.7)
PLATELETS: 15 10*3/uL — AB (ref 150–400)
RBC: 3.29 MIL/uL — ABNORMAL LOW (ref 3.87–5.11)
RDW: 17 % — AB (ref 11.5–15.5)
WBC: 5 10*3/uL (ref 4.0–10.5)

## 2017-06-28 LAB — GLUCOSE, CAPILLARY
Glucose-Capillary: 318 mg/dL — ABNORMAL HIGH (ref 65–99)
Glucose-Capillary: 322 mg/dL — ABNORMAL HIGH (ref 65–99)

## 2017-06-28 LAB — MAGNESIUM: Magnesium: 1.7 mg/dL (ref 1.7–2.4)

## 2017-06-28 LAB — PHOSPHORUS: PHOSPHORUS: 2.2 mg/dL — AB (ref 2.5–4.6)

## 2017-06-28 LAB — TROPONIN I: Troponin I: 0.04 ng/mL (ref <0.03)

## 2017-06-28 MED ORDER — SODIUM CHLORIDE 0.9 % IV SOLN
Freq: Once | INTRAVENOUS | Status: AC
  Administered 2017-06-28: 09:00:00 via INTRAVENOUS

## 2017-06-28 MED ORDER — INSULIN ASPART 100 UNIT/ML ~~LOC~~ SOLN
0.0000 [IU] | SUBCUTANEOUS | Status: DC
  Administered 2017-06-28: 11 [IU] via SUBCUTANEOUS
  Administered 2017-06-28: 5 [IU] via SUBCUTANEOUS
  Administered 2017-06-28: 11 [IU] via SUBCUTANEOUS
  Administered 2017-06-29: 5 [IU] via SUBCUTANEOUS
  Administered 2017-06-29: 3 [IU] via SUBCUTANEOUS
  Administered 2017-06-29: 2 [IU] via SUBCUTANEOUS

## 2017-06-28 MED ORDER — POTASSIUM PHOSPHATES 15 MMOLE/5ML IV SOLN
20.0000 mmol | Freq: Once | INTRAVENOUS | Status: AC
  Administered 2017-06-28: 20 mmol via INTRAVENOUS
  Filled 2017-06-28 (×2): qty 6.67

## 2017-06-28 MED ORDER — SODIUM CHLORIDE 0.9 % IV SOLN
2.0000 g | Freq: Three times a day (TID) | INTRAVENOUS | Status: DC
  Administered 2017-06-28 – 2017-07-03 (×15): 2 g via INTRAVENOUS
  Filled 2017-06-28 (×18): qty 2

## 2017-06-28 MED ORDER — OXYCODONE HCL ER 20 MG PO T12A: 20.0000 mg | EXTENDED_RELEASE_TABLET | Freq: Two times a day (BID) | ORAL | Status: DC

## 2017-06-28 MED ORDER — SODIUM CHLORIDE 0.9 % IV SOLN
INTRAVENOUS | Status: DC
  Administered 2017-06-28 – 2017-06-29 (×2): via INTRAVENOUS

## 2017-06-28 MED ORDER — MAGNESIUM SULFATE 2 GM/50ML IV SOLN
2.0000 g | Freq: Once | INTRAVENOUS | Status: AC
  Administered 2017-06-28: 2 g via INTRAVENOUS
  Filled 2017-06-28: qty 50

## 2017-06-28 MED ORDER — STERILE WATER FOR INJECTION IV SOLN
INTRAVENOUS | Status: DC
  Administered 2017-06-28 – 2017-06-29 (×2): via INTRAVENOUS
  Filled 2017-06-28 (×5): qty 850

## 2017-06-28 MED ORDER — GABAPENTIN 600 MG PO TABS
300.0000 mg | ORAL_TABLET | Freq: Two times a day (BID) | ORAL | Status: DC
  Administered 2017-06-28 – 2017-07-03 (×9): 300 mg via ORAL
  Filled 2017-06-28 (×10): qty 1

## 2017-06-28 MED ORDER — SODIUM CHLORIDE 0.9 % IV SOLN
2.0000 g | Freq: Once | INTRAVENOUS | Status: AC
  Administered 2017-06-28: 2 g via INTRAVENOUS
  Filled 2017-06-28: qty 20

## 2017-06-28 MED ORDER — POTASSIUM CHLORIDE 10 MEQ/50ML IV SOLN
10.0000 meq | INTRAVENOUS | Status: AC
  Administered 2017-06-28 – 2017-06-29 (×6): 10 meq via INTRAVENOUS
  Filled 2017-06-28: qty 50

## 2017-06-28 MED ORDER — OXYCODONE HCL ER 20 MG PO T12A
20.0000 mg | EXTENDED_RELEASE_TABLET | Freq: Two times a day (BID) | ORAL | Status: DC
  Administered 2017-06-28 – 2017-06-30 (×5): 20 mg via ORAL
  Filled 2017-06-28 (×5): qty 1

## 2017-06-28 MED ORDER — SODIUM CHLORIDE 0.9 % IV SOLN
2.0000 g | Freq: Once | INTRAVENOUS | Status: AC
  Administered 2017-06-28: 2 g via INTRAVENOUS
  Filled 2017-06-28 (×2): qty 20

## 2017-06-28 MED ORDER — ENSURE ENLIVE PO LIQD
237.0000 mL | Freq: Three times a day (TID) | ORAL | Status: DC
  Administered 2017-06-28 – 2017-07-05 (×13): 237 mL via ORAL

## 2017-06-28 NOTE — Progress Notes (Signed)
Physical Therapy Treatment Patient Details Name: [name] MRN: [id number] DOB: 16-Jul-1955 Today's Date: 06/28/2017    History of Present Illness Pt is a 62 y/o female admitted secondary to nosebleeds and presenting with thrombocytopenia . 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    PT Comments    Pt performed gait and functional mobility.  Conversation at times appears nonsensical / tangential.  Unclear if this is baseline.  Pt follows commands well during session and otherwise appears appropriate.  Pt will continue to benefit from skilled rehab to improve function as she remains guarded.   Follow Up Recommendations  Home health PT;Supervision - Intermittent     Equipment Recommendations  None recommended by PT    Recommendations for Other Services       Precautions / Restrictions Precautions Precautions: None Restrictions Weight Bearing Restrictions: No    Mobility  Bed Mobility Overal bed mobility: Modified Independent                Transfers Overall transfer level: Needs assistance Equipment used: None Transfers: Sit to/from Stand Sit to Stand: Supervision         General transfer comment: supervision for safety  Ambulation/Gait Ambulation/Gait assistance: Min guard Ambulation Distance (Feet): 190 Feet(+ 10 ft from bed to bathroom.  ) Assistive device: None(Pt did push IV pole but no true AD used.  ) Gait Pattern/deviations: Step-through pattern;Decreased stride length Gait velocity: Decreased Gait velocity interpretation: Below normal speed for age/gender General Gait Details: slow, cautious, steady gait without use of an AD; no overt LOB or need for physical assistance.     Stairs             Wheelchair Mobility    Modified Rankin (Stroke Patients Only)       Balance Overall balance assessment: Needs assistance Sitting-balance support: Feet supported Sitting balance-Leahy Scale: Good       Standing balance-Leahy Scale: Fair                              Cognition Arousal/Alertness: Awake/alert Behavior During Therapy: WFL for tasks assessed/performed Overall Cognitive Status: Difficult to assess                                 General Comments: Pt tangential in conversation.  Pt reaching for therapist shirt and asking if I had another one she could wear.  Required repeated cueing and answers to questions at times not making sense.        Exercises      General Comments        Pertinent Vitals/Pain Pain Assessment: 0-10 Pain Score: 8  Pain Location: R lower quadrant of abdomen. Pain Descriptors / Indicators: Discomfort;Sore Pain Intervention(s): Monitored during session;Repositioned    Home Living                      Prior Function            PT Goals (current goals can now be found in the care plan section) Acute Rehab PT Goals Patient Stated Goal: return home Potential to Achieve Goals: Good Progress towards PT goals: Progressing toward goals    Frequency  Min 3X/week      PT Plan Current plan remains appropriate    Co-evaluation              AM-PAC PT "6 Clicks" Daily Activity  Outcome Measure  Difficulty turning over in bed (including adjusting bedclothes, sheets and blankets)?: None Difficulty moving from lying on back to sitting on the side of the bed? : None Difficulty sitting down on and standing up from a chair with arms (e.g., wheelchair, bedside commode, etc,.)?: A Little Help needed moving to and from a bed to chair (including a wheelchair)?: A Little Help needed walking in hospital room?: A Little Help needed climbing 3-5 steps with a railing? : A Little 6 Click Score:  12    End of Session Equipment Utilized During Treatment: Gait belt;Oxygen Activity Tolerance: Patient limited by fatigue Patient left: with call bell/phone within reach;in chair;with family/visitor present(family sitting at chair side.  ) Nurse Communication: Mobility status PT Visit Diagnosis: Other abnormalities of gait and mobility (R26.89);Muscle weakness (generalized) (M62.81)     Time: 3810-1751 PT Time Calculation (min) (ACUTE ONLY): 37 min  Charges:  $Gait Training: 8-22 mins $Therapeutic Activity: 8-22 mins                    G Codes:       Governor Rooks, PTA pager [phone]    Cristela Blue 06/28/2017, 12:27 PM

## 2017-06-28 NOTE — Consult Note (Signed)
[place] Nurse wound consult note Reason for Consult: "Blisters to buttocks" Wound type:  Patient alert and oriented and gave the following history of the areas on her buttocks prior to my exam:   When she is stressed she gets "fever blisters" to her buttocks.  These areas arose in December 2018.  She states that before she started chemotherapy she would take Valtrex for a couple of days and they would get better.  She has taken Valtrex for these areas since December and they are improved, but have not resolved completely.  She attributes this to the fact that the chemotherapy has changed the response to healing these areas.  Scattered areas of tissue impairment are present to bilateral buttocks. : Wound bed: Variable; primarily pink.  Some with dried exudate.  Periwound: Intact Dressing procedure/placement/frequency: On exam foam dressings were present to bilateral buttocks.  When these are removed, please apply Criticaid Clear to the open areas, then apply foam dressings to buttocks.  Change  Every 3 days and prn soiling.  If it does not cause too much pain to the patient, lift the foam dressings and apply Criticaid Clear to the open areas daily, then place the foam dressing back into place.  Thank you for the consult.  Discussed plan of care with the patient and bedside nurse.  [facility] nurse will not follow at this time.  Please re-consult the [facility] team if needed.  Val Riles, RN, MSN, CWOCN, CNS-BC, pager [phone]

## 2017-06-28 NOTE — Progress Notes (Signed)
FPTS Interim Progress Note  Went to see [name] this morning. Patient states she is feeling better and had no questions. Looks in good spirits, smiling.   Appreciate great care by CCM, happy to re-start care when patient is discharged from ICU. Per ICU attending patient will likely come out to [place] on 06/29/17.   Caroline More, DO 06/28/2017, 9:45 AM PGY-1, [facility] Medicine Service pager [phone]

## 2017-06-28 NOTE — Consult Note (Addendum)
[facility] Progress Note   PATIENT INFO: [name]  62 y.o. female 04-01-1956   BRIEF:  62 y/o female, recently diagnosed with metastatic adeno ca, s/p chemoRx 1 wk ago, followed by Dr. Marin Olp.  She was admitted 06/27/17 with sepsis and epistaxis.  She is on Eliquis for RLL PE and bilateral LE DVT. Empirically started on Vanco + Zosyn. Followed by Oncology and Palliative Care.  Was initially full code but after discussions on admission, changed to DNR. Boyfriend-Steve- has POA- at bedside.   SUBJECTIVE:  Blood cultures with GNR > P.aeruginosa and 1 bottle with GPR's, speciation pending (currently on vanc / zosyn). No epistaxis or any signs of bleeding. On very low dose levophed (39mcg/min), stopped this morning during rounds. Getting 1u platelets per oncology.   VITALS: BP (!) 114/58   Pulse 100   Temp 98 F (36.7 C) (Oral)   Resp 15   Ht 5\' 6"  (1.676 m)   Wt 70.2 kg (154 lb 12.2 oz)   LMP 12/08/2006   SpO2 94%   BMI 24.98 kg/m    PHYSICAL EXAM: General: Chronically ill appearing female, resting in bed, in NAD. Appears frail. Neuro: A&O x 3, non-focal.  HEENT: [place]/AT. EOMI, sclerae anicteric. Cardiovascular: RRR, no M/R/G.  Lungs: Respirations even and unlabored.  Faint rhonchi in bases, otherwise clear. Abdomen: BS x 4, soft, NT/ND.  Musculoskeletal: No gross deformities, no edema.  Skin: Intact, warm, no rashes.   CMP Latest Ref Rng & Units 06/28/2017 06/27/2017 06/27/2017  Glucose 65 - 99 mg/dL 236(H) 191(H) 251(H)  BUN 6 - 20 mg/dL 12 15 18   Creatinine 0.44 - 1.00 mg/dL 0.83 0.77 0.70  Sodium 135 - 145 mmol/L 128(L) 123(L) 120(L)  Potassium 3.5 - 5.1 mmol/L 3.6 4.1 4.5  Chloride 101 - 111 mmol/L 102 98(L) 91(L)  CO2 22 - 32 mmol/L 17(L) 16(L) 19(L)  Calcium 8.9 - 10.3 mg/dL 5.4(LL) 5.2(LL) 6.2(LL)  Total Protein 6.5 - 8.1 g/dL 5.4(L) 4.8(L) 5.7(L)  Total Bilirubin 0.3 - 1.2 mg/dL 0.4 0.8 0.9  Alkaline Phos 38 - 126 U/L 151(H)  170(H) 191(H)  AST 15 - 41 U/L 48(H) 27 23  ALT 14 - 54 U/L 37 22 26   CBC Latest Ref Rng & Units 06/28/2017 06/27/2017 06/27/2017  WBC 4.0 - 10.5 K/uL 5.0 3.4(L) 2.9(L)  Hemoglobin 12.0 - 15.0 g/dL 9.4(L) 9.1(L) 10.7(L)  Hematocrit 36.0 - 46.0 % 27.7(L) 27.1(L) 32.3(L)  Platelets 150 - 400 K/uL 15(LL) 22(LL) 42(L)    CXR 3/11 > interstitial prominence, pulm edema vs lymphagetic spread of tumor. CT head 3/10 > sinusitus. PET scan 06/05/17 > widespread skeletal mets within spine / pelvis, nodular lesion in RUL with mild metabolic activity.   ASSESSMENT / PLAN: 62 y.o. F with PMH recently diagnosed metastatic adenocarcinoma, extensive skeletal mets, s/p 1 cycle chemotherapy, admitted with sepsis.  Blood cultures growing GNRs (pseudomonas) and GPRs (speciation pending). Code status changed to DNR after admission.   Bacteremia with septic shock - Blood cultures growing GNRs (pseudomonas) and GPRs (speciation pending). Was started on levophed but has been at very low dosing, being turned off this morning. Plan: Continue empiric abx with vanc / zosyn. Follow cultures through completion. D/c levophed. Continue stress dose steroids, wean gradually.  Hx PE with DVT (on eliquis). Plan: Continue eliquis and monitor for signs of any bleeding given thrombocytopenia.  Hyponatremia - improving. NAGMA. Hypocalcemia - corrects to 7.16, currently being replaced. Hypophosphatemia - currently being replaced. Hypomagnesemia -  currently being replaced. Plan: Continue fluids. Add bicarb for now given NAGMA. Repeat BMP this afternoon as well as daily.  Mild anemia. Thrombocytopenia - currently getting platelet transfusion. Plan: Transfuse for Hgb < 7. Monitor platelets. Transfusions per oncology.  Severe protein calorie malnutrition. Plan: Nutrition consult placed.  Metastatic adenocarcinoma - followed by Dr. Marin Olp and just recently completed first chemo session. Plan: F/u with Dr.  Marin Olp as outpatient.  On admission, Dr. Pati Gallo counseled pt's boyfriend, [name] [facility]) and discussed code status. After some thought , he confirmed that pt would not have wanted aggressive extreme life sustaining measures if she sustained CardioPulm arrest. He likes for her to be DNR/DNI.   Off pressors and stable.  Will transfer our of ICU today and ask FMTS to assume care starting in AM 3/12.  PCCM off at that time.  Montey Hora, [facility] Pager: [phone]  or [phone] 06/28/2017, 9:22 AM  Attending Note:  62 year old female with metastatic adenocarcinoma admitted for GNR sepsis growing pseudomonas with sensitivities pending.  On exam, she is alert and interactive, moving all ext to command.  I reviewed CXR myself, some pulmonary edema noted.  Discussed with PCCM-NP.  Acute respiratory failure:  - Monitor for airway protection  Hypoxemia:  - Titrate O2 for sat of 88-92%  Bacteremia:  - Change zosyn to cefepime and d/c vanc  GOC: DNR with H/O following.  Transfer out of the ICU and transfer care to [place] with PCCM off 3/12.  Patient seen and examined, agree with above note.  I dictated the care and orders written for this patient under my direction.  Rush Farmer, [place]

## 2017-06-28 NOTE — Significant Event (Signed)
CRITICAL VALUE ALERT  Critical Value:  K 2.4, calcium 5.7  Date & Time Notied:  06/28/2017 at [place]  Provider Notified: Dr. Nelda Marseille.   Orders Received/Actions taken: Potassium chloride X 6 runs, 2g calcium gluconate   Sunday Klos

## 2017-06-28 NOTE — Progress Notes (Signed)
PHARMACY - PHYSICIAN COMMUNICATION CRITICAL VALUE ALERT - BLOOD CULTURE IDENTIFICATION (BCID)  [name] is an 62 y.o. female who presented to [facility] on 06/26/2017.  Assessment: 62 yo female with metastatic melanoma s/p radiation. Now with 2/2 pseudomonas bacteremia. Will leave the vancomycin for now given potential of additional infection in immunocompromised patient.  Name of physician (or Provider) Contacted: CCM  Current antibiotics: vanc/zosyn  Changes to prescribed antibiotics recommended:  Patient is on recommended antibiotics - No changes needed  Results for orders placed or performed during the hospital encounter of 06/26/17  Blood Culture ID Panel (Reflexed) (Collected: 06/27/2017  6:07 AM)  Result Value Ref Range   Enterococcus species NOT DETECTED NOT DETECTED   Listeria monocytogenes NOT DETECTED NOT DETECTED   Staphylococcus species NOT DETECTED NOT DETECTED   Staphylococcus aureus NOT DETECTED NOT DETECTED   Streptococcus species NOT DETECTED NOT DETECTED   Streptococcus agalactiae NOT DETECTED NOT DETECTED   Streptococcus pneumoniae NOT DETECTED NOT DETECTED   Streptococcus pyogenes NOT DETECTED NOT DETECTED   Acinetobacter baumannii NOT DETECTED NOT DETECTED   Enterobacteriaceae species NOT DETECTED NOT DETECTED   Enterobacter cloacae complex NOT DETECTED NOT DETECTED   Escherichia coli NOT DETECTED NOT DETECTED   Klebsiella oxytoca NOT DETECTED NOT DETECTED   Klebsiella pneumoniae NOT DETECTED NOT DETECTED   Proteus species NOT DETECTED NOT DETECTED   Serratia marcescens NOT DETECTED NOT DETECTED   Carbapenem resistance NOT DETECTED NOT DETECTED   Haemophilus influenzae NOT DETECTED NOT DETECTED   Neisseria meningitidis NOT DETECTED NOT DETECTED   Pseudomonas aeruginosa DETECTED (A) NOT DETECTED   Candida albicans NOT DETECTED NOT DETECTED   Candida glabrata NOT DETECTED NOT DETECTED   Candida krusei NOT DETECTED NOT DETECTED   Candida parapsilosis  NOT DETECTED NOT DETECTED   Candida tropicalis NOT DETECTED NOT DETECTED    Harvel Quale 06/28/2017  2:56 AM

## 2017-06-28 NOTE — Progress Notes (Signed)
Pharmacy Antibiotic Note  [name] is a 62 y.o. female admitted on 06/26/2017 with sepsis.  Pharmacy initially consulted for vancomycin and Zosyn dosing.  Blood cultures now growing pseudomonas, pharmacy will switch to cefepime for better pseudomonal coverage.  Initially 1 blood culture reported as gram positive - called lab, this was erroneous - actually also growing GNR.  Tmax 102.2 on admission, now afebrile.   Plan: Continue vancomycin 750 mg every 12 hours (goal trough 15-20 mcg/mL) for now, likely okay to d/c vancomycin. D/c Zosyn Start cefepime 2 g IV q 8 hrs. Monitor cultures, s/sx, and LOT.  Height: 5\' 6"  (167.6 cm) Weight: 154 lb 12.2 oz (70.2 kg) IBW/kg (Calculated) : 59.3  Temp (24hrs), Avg:98.4 F (36.9 C), Min:98 F (36.7 C), Max:99.3 F (37.4 C)  Recent Labs  Lab 06/21/17 1130 06/26/17 0357 06/26/17 1847 06/27/17 0510 06/27/17 0607 06/27/17 1239 06/27/17 1539 06/27/17 1556 06/28/17 0359  WBC 20.7* 4.3 3.4* 2.9*  --   --  3.4*  --  5.0  CREATININE 0.70 0.57  --  0.70  --  0.77  --   --  0.83  LATICACIDVEN  --   --   --   --  1.7 3.5*  --  2.8*  --     Estimated Creatinine Clearance: 65.8 mL/min (by C-G formula based on SCr of 0.83 mg/dL).    Allergies  Allergen Reactions  . Ciprofloxacin Nausea Only    Antimicrobials this admission:  Vancomycin 3/10 >> Cefepime 3/11 > Zosyn 3/10 >>3/11  Dose adjustments this admission:   Microbiology results:  3/10 BCx: 2/2 GNR (BCID w/ pseudomonas) 3/10 MRSA PCR: neg  Thank you for allowing pharmacy to be a part of this patient's care.  Uvaldo Rising, BCPS  Clinical Pharmacist Pager [phone]  06/28/2017 10:18 AM

## 2017-06-28 NOTE — Progress Notes (Signed)
Inpatient Diabetes Program Recommendations  AACE/ADA: New Consensus Statement on Inpatient Glycemic Control (2015)  Target Ranges:  Prepandial:   less than 140 mg/dL      Peak postprandial:   less than 180 mg/dL (1-2 hours)      Critically ill patients:  140 - 180 mg/dL   Lab Results  Component Value Date   GLUCAP 92 06/17/2017   HGBA1C 5.3 11/10/2014    Review of Glycemic ControlResults for LASHEENA, FRIEZE (MRN [id number]) as of 06/28/2017 13:35  Ref. Range 06/26/2017 03:57 06/27/2017 05:10 06/27/2017 12:39 06/28/2017 03:59  Glucose Latest Ref Range: 65 - 99 mg/dL 318 (H) 251 (H) 191 (H) 236 (H)    Diabetes history: None Current orders for Inpatient glycemic control:  None, Solucortef 50 mg IV q 6 hours Inpatient Diabetes Program Recommendations:   Please consider ordering Novolog correction q 4 hours-moderate while on steroids.  Thanks,  Adah Perl, RN, BC-ADM Inpatient Diabetes Coordinator Pager [phone] (8a-5p)

## 2017-06-28 NOTE — Progress Notes (Signed)
[name] is now down in the cardiac ICU.  She is growing gram-negative rods and gram-positive cocci in her blood.  She is septic.  Thankfully, she is not neutropenic.  Her platelet count is dropping.  I would go ahead and give her another unit of platelets.  She is hypocalcemic.  Her labs still show some hyperglycemia.  Her blood sugar is 236.  Her creatinine is 0.83.  She is now a DNR.  She is alert.  She is on pressor support.  Blood pressure was 116/73.  I will be very interested to see what the bacteria are.  She is on broad-spectrum coverage with Zosyn and vancomycin.  She been having some diarrhea.  As such, I wonder if this is not an enteric organism.  She is had no epistaxis.  I know that she will get fantastic care in the cardiac ICU.  We will continue to follow along.  It would not surprise me that her platelet count is low.  I suspect that there probably is marrow involvement by her malignancy.  That count in addition to her sepsis, in addition to the chemotherapy are all contributing.  Lattie Haw, MD  Romans 8:18

## 2017-06-28 NOTE — Progress Notes (Signed)
Initial Nutrition Assessment  DOCUMENTATION CODES:   Not applicable  INTERVENTION:   Increase Ensure Enlive po to TID, each supplement provides 350 kcal and 20 grams of protein  NUTRITION DIAGNOSIS:   Increased nutrient needs related to acute illness, cancer and cancer related treatments as evidenced by estimated needs.  GOAL:   Patient will meet greater than or equal to 90% of their needs  MONITOR:   PO intake, Supplement acceptance, Labs, Weight trends  REASON FOR ASSESSMENT:   Consult Assessment of nutrition requirement/status  ASSESSMENT:   62 yo female admitted with epistaxis secondary to thrombocytopenia, bacteremia with septic shock. Pt with recent PE with b/l DVT. Pt with recently diagnosed stage IV lung cancer with bony mets s/p chemo x 1 wk ago after completion of radiation. Pt with hx of chronic pancreatitis s/p biliary stenting, adjustment disorder with anxious features   Pt reports appetite is fair; pt ate 25% breakfast this AM. Pt reports at home eating 3 small "meals"/snacks per day and drinking 2-3 [employer] shakes daily. Pt does reports experiencing some N/V post chemo.   Pt reports no significant weight changes. Per weight encounters, weight has fluctuated up and down. Weight up this admission. I/O in flow sheet not accurate as UOP not measured  Labs: sodium 128, phosphorus 2.2, corrected calcium 7.2 (albumin 1.8)-calcium and phos supplemented Meds: NS at 100 ml/hr, remeron, solumedrol  NUTRITION - FOCUSED PHYSICAL EXAM:    Most Recent Value  Orbital Region  No depletion  Upper Arm Region  No depletion  Thoracic and Lumbar Region  No depletion  Buccal Region  No depletion  Temple Region  No depletion  Clavicle Bone Region  No depletion  Clavicle and Acromion Bone Region  No depletion  Scapular Bone Region  No depletion  Dorsal Hand  No depletion  Patellar Region  No depletion  Anterior Thigh Region  No depletion  Posterior Calf  Region  No depletion  Edema (RD Assessment)  Mild       Diet Order:  Diet regular Room service appropriate? Yes; Fluid consistency: Thin  EDUCATION NEEDS:   No education needs have been identified at this time  Skin:  Skin Assessment: Reviewed RN Assessment  Last BM:  3/11  Height:   Ht Readings from Last 1 Encounters:  06/26/17 5\' 6"  (1.676 m)    Weight:   Wt Readings from Last 1 Encounters:  06/28/17 154 lb 12.2 oz (70.2 kg)    Ideal Body Weight:     BMI:  Body mass index is 24.98 kg/m.  Estimated Nutritional Needs:   Kcal:  1800-2000 kcal  Protein:  90-105 g  Fluid:  >/= 1.8 L   Kerman Passey MS, RD, LDN, CNSC [phone] Pager  [phone] Weekend/On-Call Pager

## 2017-06-28 NOTE — Progress Notes (Signed)
eLink Physician-Brief Progress Note Patient Name: [name] DOB: 12-15-1955 MRN: [id number]   Date of Service  06/28/2017  HPI/Events of Note  Hypokalemia, hypocalcemia, hypomagnesemia and hypophosphatemia - K+ = 3.6, Ca++ = 5.4, PO4--- = 2.2, Mg++ = 1.7 and Creatinine = 0.83  eICU Interventions       Intervention Category Major Interventions: Electrolyte abnormality - evaluation and management  Sommer,Steven Cornelia Copa 06/28/2017, 6:52 AM

## 2017-06-29 DIAGNOSIS — R7881 Bacteremia: Secondary | ICD-10-CM

## 2017-06-29 DIAGNOSIS — R0989 Other specified symptoms and signs involving the circulatory and respiratory systems: Secondary | ICD-10-CM

## 2017-06-29 DIAGNOSIS — R531 Weakness: Secondary | ICD-10-CM

## 2017-06-29 DIAGNOSIS — F17210 Nicotine dependence, cigarettes, uncomplicated: Secondary | ICD-10-CM

## 2017-06-29 DIAGNOSIS — Z881 Allergy status to other antibiotic agents status: Secondary | ICD-10-CM

## 2017-06-29 DIAGNOSIS — N320 Bladder-neck obstruction: Secondary | ICD-10-CM

## 2017-06-29 DIAGNOSIS — R945 Abnormal results of liver function studies: Secondary | ICD-10-CM

## 2017-06-29 DIAGNOSIS — E876 Hypokalemia: Secondary | ICD-10-CM

## 2017-06-29 DIAGNOSIS — R233 Spontaneous ecchymoses: Secondary | ICD-10-CM

## 2017-06-29 DIAGNOSIS — R6521 Severe sepsis with septic shock: Secondary | ICD-10-CM

## 2017-06-29 LAB — PHOSPHORUS: Phosphorus: 1.3 mg/dL — ABNORMAL LOW (ref 2.5–4.6)

## 2017-06-29 LAB — PREPARE PLATELET PHERESIS: Unit division: 0

## 2017-06-29 LAB — CBC WITH DIFFERENTIAL/PLATELET
HCT: 32.1 % — ABNORMAL LOW (ref 36.0–46.0)
Hemoglobin: 11.2 g/dL — ABNORMAL LOW (ref 12.0–15.0)
MCH: 28.9 pg (ref 26.0–34.0)
MCHC: 34.9 g/dL (ref 30.0–36.0)
MCV: 82.9 fL (ref 78.0–100.0)
PLATELETS: 22 10*3/uL — AB (ref 150–400)
RBC: 3.87 MIL/uL (ref 3.87–5.11)
RDW: 16.4 % — ABNORMAL HIGH (ref 11.5–15.5)
WBC: 5.5 10*3/uL (ref 4.0–10.5)

## 2017-06-29 LAB — COMPREHENSIVE METABOLIC PANEL
ALT: 32 U/L (ref 14–54)
AST: 30 U/L (ref 15–41)
Albumin: 1.5 g/dL — ABNORMAL LOW (ref 3.5–5.0)
Alkaline Phosphatase: 136 U/L — ABNORMAL HIGH (ref 38–126)
Anion gap: 7 (ref 5–15)
BUN: 11 mg/dL (ref 6–20)
CALCIUM: 5.8 mg/dL — AB (ref 8.9–10.3)
CO2: 21 mmol/L — ABNORMAL LOW (ref 22–32)
CREATININE: 0.65 mg/dL (ref 0.44–1.00)
Chloride: 103 mmol/L (ref 101–111)
GFR calc Af Amer: >60 mL/min (ref >60)
GFR calc non Af Amer: >60 mL/min (ref >60)
GLUCOSE: 184 mg/dL — AB (ref 65–99)
Potassium: 2.7 mmol/L — CL (ref 3.5–5.1)
SODIUM: 131 mmol/L — AB (ref 135–145)
TOTAL PROTEIN: 4.9 g/dL — AB (ref 6.5–8.1)
Total Bilirubin: 0.5 mg/dL (ref 0.3–1.2)

## 2017-06-29 LAB — BASIC METABOLIC PANEL
ANION GAP: 10 (ref 5–15)
Anion gap: 11 (ref 5–15)
BUN: 13 mg/dL (ref 6–20)
BUN: 10 mg/dL (ref 6–20)
CHLORIDE: 101 mmol/L (ref 101–111)
CO2: 17 mmol/L — AB (ref 22–32)
CO2: 21 mmol/L — ABNORMAL LOW (ref 22–32)
Calcium: 5.7 mg/dL — CL (ref 8.9–10.3)
Calcium: 5.6 mg/dL — CL (ref 8.9–10.3)
Chloride: 101 mmol/L (ref 101–111)
Creatinine, Ser: 0.68 mg/dL (ref 0.44–1.00)
Creatinine, Ser: 0.69 mg/dL (ref 0.44–1.00)
GFR calc Af Amer: >60 mL/min (ref >60)
GFR calc Af Amer: >60 mL/min (ref >60)
GFR calc non Af Amer: >60 mL/min (ref >60)
GLUCOSE: 328 mg/dL — AB (ref 65–99)
Glucose, Bld: 283 mg/dL — ABNORMAL HIGH (ref 65–99)
POTASSIUM: 2.4 mmol/L — AB (ref 3.5–5.1)
POTASSIUM: 2.8 mmol/L — AB (ref 3.5–5.1)
SODIUM: 132 mmol/L — AB (ref 135–145)
Sodium: 129 mmol/L — ABNORMAL LOW (ref 135–145)

## 2017-06-29 LAB — CBC
HCT: 23.6 % — ABNORMAL LOW (ref 36.0–46.0)
Hemoglobin: 7.8 g/dL — ABNORMAL LOW (ref 12.0–15.0)
MCH: 27.6 pg (ref 26.0–34.0)
MCHC: 33.1 g/dL (ref 30.0–36.0)
MCV: 83.4 fL (ref 78.0–100.0)
PLATELETS: 33 10*3/uL — AB (ref 150–400)
RBC: 2.83 MIL/uL — AB (ref 3.87–5.11)
RDW: 17.2 % — AB (ref 11.5–15.5)
WBC: 5.3 10*3/uL (ref 4.0–10.5)

## 2017-06-29 LAB — MAGNESIUM
MAGNESIUM: 2.2 mg/dL (ref 1.7–2.4)
Magnesium: 1.9 mg/dL (ref 1.7–2.4)

## 2017-06-29 LAB — GLUCOSE, CAPILLARY
GLUCOSE-CAPILLARY: 203 mg/dL — AB (ref 65–99)
GLUCOSE-CAPILLARY: 221 mg/dL — AB (ref 65–99)
Glucose-Capillary: 150 mg/dL — ABNORMAL HIGH (ref 65–99)
Glucose-Capillary: 156 mg/dL — ABNORMAL HIGH (ref 65–99)
Glucose-Capillary: 159 mg/dL — ABNORMAL HIGH (ref 65–99)
Glucose-Capillary: 274 mg/dL — ABNORMAL HIGH (ref 65–99)

## 2017-06-29 LAB — URINE CULTURE: Culture: NO GROWTH

## 2017-06-29 LAB — C DIFFICILE QUICK SCREEN W PCR REFLEX
C DIFFICLE (CDIFF) ANTIGEN: NEGATIVE
C Diff interpretation: NOT DETECTED
C Diff toxin: NEGATIVE

## 2017-06-29 LAB — BPAM PLATELET PHERESIS
Blood Product Expiration Date: 201903122359
ISSUE DATE / TIME: 201903110821
UNIT TYPE AND RH: 5100

## 2017-06-29 LAB — PREPARE RBC (CROSSMATCH)

## 2017-06-29 LAB — CALCIUM, IONIZED: Calcium, Ionized, Serum: 3.2 mg/dL — ABNORMAL LOW (ref 4.5–5.6)

## 2017-06-29 MED ORDER — POTASSIUM CHLORIDE CRYS ER 20 MEQ PO TBCR
40.0000 meq | EXTENDED_RELEASE_TABLET | Freq: Once | ORAL | Status: AC
  Administered 2017-06-29: 40 meq via ORAL
  Filled 2017-06-29: qty 2

## 2017-06-29 MED ORDER — FENTANYL 75 MCG/HR TD PT72
75.0000 ug | MEDICATED_PATCH | TRANSDERMAL | Status: DC
  Administered 2017-06-29: 75 ug via TRANSDERMAL
  Filled 2017-06-29: qty 1

## 2017-06-29 MED ORDER — POTASSIUM PHOSPHATES 15 MMOLE/5ML IV SOLN
20.0000 mmol | Freq: Once | INTRAVENOUS | Status: AC
  Administered 2017-06-29: 20 mmol via INTRAVENOUS
  Filled 2017-06-29: qty 6.67

## 2017-06-29 MED ORDER — INSULIN ASPART 100 UNIT/ML ~~LOC~~ SOLN
0.0000 [IU] | Freq: Three times a day (TID) | SUBCUTANEOUS | Status: DC
  Administered 2017-06-30: 11 [IU] via SUBCUTANEOUS
  Administered 2017-06-30: 3 [IU] via SUBCUTANEOUS
  Administered 2017-06-30 – 2017-07-01 (×3): 5 [IU] via SUBCUTANEOUS
  Administered 2017-07-01 – 2017-07-02 (×2): 8 [IU] via SUBCUTANEOUS
  Administered 2017-07-02 (×2): 3 [IU] via SUBCUTANEOUS

## 2017-06-29 MED ORDER — FUROSEMIDE 10 MG/ML IJ SOLN
20.0000 mg | Freq: Once | INTRAMUSCULAR | Status: AC
  Administered 2017-06-29: 20 mg via INTRAVENOUS
  Filled 2017-06-29: qty 2

## 2017-06-29 MED ORDER — POTASSIUM CHLORIDE 10 MEQ/50ML IV SOLN
10.0000 meq | INTRAVENOUS | Status: AC
  Administered 2017-06-29 (×6): 10 meq via INTRAVENOUS
  Filled 2017-06-29: qty 50

## 2017-06-29 MED ORDER — SODIUM CHLORIDE 0.9 % IV SOLN
2.0000 g | Freq: Once | INTRAVENOUS | Status: DC
  Filled 2017-06-29: qty 20

## 2017-06-29 MED ORDER — SODIUM CHLORIDE 0.9 % IV SOLN
Freq: Once | INTRAVENOUS | Status: AC
  Administered 2017-06-29: 07:00:00 via INTRAVENOUS

## 2017-06-29 MED ORDER — INSULIN ASPART 100 UNIT/ML ~~LOC~~ SOLN
0.0000 [IU] | Freq: Every day | SUBCUTANEOUS | Status: DC
  Administered 2017-06-29: 3 [IU] via SUBCUTANEOUS
  Administered 2017-06-30 – 2017-07-02 (×3): 4 [IU] via SUBCUTANEOUS

## 2017-06-29 MED ORDER — SODIUM CHLORIDE 0.9 % IV SOLN
1.0000 g | Freq: Once | INTRAVENOUS | Status: AC
  Administered 2017-06-29: 1 g via INTRAVENOUS
  Filled 2017-06-29: qty 10

## 2017-06-29 NOTE — Progress Notes (Signed)
CRITICAL VALUE ALERT  Critical Value:  Calcium 5.6  Date & Time Notied:  06/29/17  1920   Provider Notified: [place]

## 2017-06-29 NOTE — Consult Note (Signed)
[facility]    Date of Admission:  06/26/2017           Reason for Consult: Pseudomonal Bacteremia  Referring Provider: Andria Frames Primary Care Provider: Nuala Alpha, DO   Assessment/Plan:  Pseudomonas Bacteremia - Afebrile since initial fever. Most likely from depressed immune function secondary to lung cancer. Agree with Cefepime pending sensitivities. No indication for vancomycin with no gram positive organisms noted. Goal for treatment will be 10 days of therapy. Consider additional work up if fevers return or for recurrent infection.    Active Problems:   Goals of care, counseling/discussion   Tobacco abuse   Thrombocytopenia (HCC)   Palliative care encounter   Anterior epistaxis   Deep vein thrombosis (DVT) of distal vein of both lower extremities (HCC)   Fever   Bacteremia due to Pseudomonas   . apixaban  2.5 mg Oral BID  . feeding supplement (ENSURE ENLIVE)  237 mL Oral TID BM  . fentaNYL  75 mcg Transdermal Q72H  . folic acid  1 mg Oral Daily  . gabapentin  300 mg Oral BID  . hydrocortisone sod succinate (SOLU-CORTEF) inj  50 mg Intravenous Q6H  . insulin aspart  0-15 Units Subcutaneous TID WC  . insulin aspart  0-5 Units Subcutaneous QHS  . lidocaine  1 patch Transdermal Q24H  . mirtazapine  7.5 mg Oral QHS  . nicotine  14 mg Transdermal Daily  . oxyCODONE  20 mg Oral Q12H  . pantoprazole  40 mg Oral Daily  . sodium chloride flush  10-40 mL Intracatheter Q12H     HPI: [name] is a 62 y.o. female evaluated in the emergency department the chief complaint of epistaxis.  She had recently been diagnosed with PE and started on Eliquis.  She was also noted to have a petechial rash on her legs and lightheadedness.  Her platelets were noted to be 16.  She is currently being treated for stage IV lung cancer.  Overnight on 3/9 she was noted to have a 102.2 temperature, and was hyponatremic at 120 and hypocalcemic 6.2.  Blood  cultures were drawn and recently turn positive for pseudomonas aeruginosa.  She was initially started on vancomycin and Zosyn on 3/10 and transition to cefepime on 3/11.  She did express abdominal pain overnight on 3/9.  On the afternoon of 3/10 she was noted to be tachypneic, tachycardic, and hypotensive.  She received 2 L of normal saline with her systolic blood pressure remaining low. She was transferred to the ICU and placed on pressors which have since been stopped. Her current IV antibiotics include cefepime for Psuedomonas.   Overall she reports feeling a little better today. Denies any fevers, chills, nausea, vomiting or diarrhea. Has several questions regarding bacterial infection.   Review of Systems: Review of Systems  Constitutional: Negative for chills and fever.  Respiratory: Positive for cough. Negative for sputum production, shortness of breath and wheezing.   Cardiovascular: Negative for chest pain.  Skin: Negative for rash.  Neurological: Negative for weakness.     Past Medical History:  Diagnosis Date  . Abdominal pain 06/02/2017  . Abdominal pain, right upper quadrant 04/03/2016  . Acute dyspnea   . Alcohol abuse    H/o withdrawal   . Alcohol withdrawal ([place]) 10/02/2013  . Anxiety   . Cancer [facility])    unknown primary. known bone mets.  . Chronic diarrhea    hx of  . Constipation   .  Depression   . Elevated liver function tests few yrs ago  . Fatty liver 10/11/2011  . FATTY LIVER DISEASE 10/01/2008   Qualifier: Diagnosis of  By: Nelson-Smith CMA (AAMA), Dottie    . Gastric ulcer 01/20/2012  . HIATAL HERNIA 10/01/2008   Qualifier: Diagnosis of  By: Nelson-Smith CMA (AAMA), Dottie    . Incisional hernia s/p lap/open repair with mesh 07/09/2016 07/09/2016  . Major depressive disorder, recurrent, severe without psychotic features ([place])   . Metastatic lung cancer (metastasis from lung to other site), right ([place]) 06/01/2017  . Pancreatic calcification 04/03/2016  .  Pancreatic pain 03/31/2017  . Pancreatitis   . PONV (postoperative nausea and vomiting)   . 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 Rosacea   . Sciatica of right side 02/25/2017  . Wears glasses     Social History   Tobacco Use  . Smoking status: Heavy Tobacco Smoker    Packs/day: 1.00    Years: 15.00    Pack years: 15.00    Types: Cigarettes  . Smokeless tobacco: Never Used  . Tobacco comment: smoking a pack per day now  Substance Use Topics  . Alcohol use: Yes    Alcohol/week: 1.5 - 2.0 oz    Types: 3 - 4 Standard drinks or equivalent per week    Comment: quit 2016 - 2 drinks per week   . Drug use: Yes    Types: Marijuana    Comment: occasional    Family History  Problem Relation Age of Onset  . Stroke Mother   . Heart disease Father   . Heart failure Father   . Heart disease Sister     Allergies  Allergen Reactions  . Ciprofloxacin Nausea Only    OBJECTIVE: Blood pressure (!) 150/73, pulse 99, temperature 97.7 F (36.5 C), temperature source Oral, resp. rate 19, height 5\' 6"  (1.676 m), weight 162 lb 7.7 oz (73.7 kg), last menstrual period 12/08/2006, SpO2 94 %.  Physical Exam  Constitutional: She is oriented to person, place, and time. No distress.  Cardiovascular: Normal rate, regular rhythm, normal heart sounds and intact distal pulses. Exam reveals no gallop and no friction rub.  No murmur heard. Pulmonary/Chest: Effort normal. No respiratory distress. She has no wheezes. She has rhonchi. She has no rales. She exhibits no tenderness.  Abdominal: Soft. Bowel sounds are normal. There is no tenderness.  Neurological: She is alert and oriented to person, place, and time.  Skin: Skin is warm and dry. No rash noted.  Psychiatric: Affect normal.    Lab Results Lab Results  Component Value Date   WBC 5.3 06/29/2017   HGB 7.8 (L) 06/29/2017   HCT 23.6 (L) 06/29/2017   MCV 83.4 06/29/2017   PLT 33 (L) 06/29/2017    Lab Results  Component Value Date    CREATININE 0.65 06/29/2017   BUN 11 06/29/2017   NA 131 (L) 06/29/2017   K 2.7 (LL) 06/29/2017   CL 103 06/29/2017   CO2 21 (L) 06/29/2017    Lab Results  Component Value Date   ALT 32 06/29/2017   AST 30 06/29/2017   ALKPHOS 136 (H) 06/29/2017   BILITOT 0.5 06/29/2017     Microbiology: Recent Results (from the past 240 hour(s))  Culture, blood (Routine X 2) w Reflex to ID Panel     Status: Abnormal (Preliminary result)   Collection Time: 06/27/17  6:07 AM  Result Value Ref Range Status   Specimen Description BLOOD LEFT HAND  Final  Special Requests IN PEDIATRIC BOTTLE Blood Culture adequate volume  Final   Culture  Setup Time   Final    GRAM NEGATIVE RODS IN PEDIATRIC BOTTLE CRITICAL RESULT CALLED TO, READ BACK BY AND VERIFIED WITH: A.MASTERS,PHARMD AT 0240 ON 06/28/17 BY G.MCADOO    Culture (A)  Final    PSEUDOMONAS AERUGINOSA SUSCEPTIBILITIES TO FOLLOW Performed at [facility], [place] [address], [place], [place] [address]    Report Status PENDING  Incomplete  Blood Culture ID Panel (Reflexed)     Status: Abnormal   Collection Time: 06/27/17  6:07 AM  Result Value Ref Range Status   Enterococcus species NOT DETECTED NOT DETECTED Final   Listeria monocytogenes NOT DETECTED NOT DETECTED Final   Staphylococcus species NOT DETECTED NOT DETECTED Final   Staphylococcus aureus NOT DETECTED NOT DETECTED Final   Streptococcus species NOT DETECTED NOT DETECTED Final   Streptococcus agalactiae NOT DETECTED NOT DETECTED Final   Streptococcus pneumoniae NOT DETECTED NOT DETECTED Final   Streptococcus pyogenes NOT DETECTED NOT DETECTED Final   Acinetobacter baumannii NOT DETECTED NOT DETECTED Final   Enterobacteriaceae species NOT DETECTED NOT DETECTED Final   Enterobacter cloacae complex NOT DETECTED NOT DETECTED Final   Escherichia coli NOT DETECTED NOT DETECTED Final   Klebsiella oxytoca NOT DETECTED NOT DETECTED Final   Klebsiella pneumoniae NOT DETECTED NOT DETECTED  Final   Proteus species NOT DETECTED NOT DETECTED Final   Serratia marcescens NOT DETECTED NOT DETECTED Final   Carbapenem resistance NOT DETECTED NOT DETECTED Final   Haemophilus influenzae NOT DETECTED NOT DETECTED Final   Neisseria meningitidis NOT DETECTED NOT DETECTED Final   Pseudomonas aeruginosa DETECTED (A) NOT DETECTED Final    Comment: CRITICAL RESULT CALLED TO, READ BACK BY AND VERIFIED WITH: A.MASTERS,PHARMD AT 6256 ON 06/28/17 BY G.MCADOO    Candida albicans NOT DETECTED NOT DETECTED Final   Candida glabrata NOT DETECTED NOT DETECTED Final   Candida krusei NOT DETECTED NOT DETECTED Final   Candida parapsilosis NOT DETECTED NOT DETECTED Final   Candida tropicalis NOT DETECTED NOT DETECTED Final    Comment: Performed at [facility], [place] [address] [place], [place] [address]  Culture, blood (Routine X 2) w Reflex to ID Panel     Status: Abnormal (Preliminary result)   Collection Time: 06/27/17  6:12 AM  Result Value Ref Range Status   Specimen Description BLOOD LEFT HAND  Final   Special Requests IN PEDIATRIC BOTTLE Blood Culture adequate volume  Final   Culture  Setup Time   Final    GRAM NEGATIVE RODS IN PEDIATRIC BOTTLE CRITICAL VALUE NOTED.  VALUE IS CONSISTENT WITH PREVIOUSLY REPORTED AND CALLED VALUE. Performed at [facility], [place] [address] [place], [place] [address]    Culture PSEUDOMONAS AERUGINOSA (A)  Final   Report Status PENDING  Incomplete  MRSA PCR Screening     Status: None   Collection Time: 06/27/17  5:40 PM  Result Value Ref Range Status   MRSA by PCR NEGATIVE NEGATIVE Final    Comment:        The GeneXpert MRSA Assay (FDA approved for NASAL specimens only), is one component of a comprehensive MRSA colonization surveillance program. It is not intended to diagnose MRSA infection nor to guide or monitor treatment for MRSA infections. Performed at [facility], [place] [address], [place], [place] [address]   Culture, Urine      Status: None   Collection Time: 06/27/17 10:58 PM  Result Value Ref Range Status   Specimen Description URINE, RANDOM  Final   Special Requests NONE  Final   Culture   Final    NO GROWTH Performed at [facility], [address], [place], [place] [address]    Report Status 06/29/2017 FINAL  Final     Terri Piedra, NP [place] [facility] [phone] Pager  06/29/2017  3:51 PM

## 2017-06-29 NOTE — Progress Notes (Signed)
CRITICAL VALUE ALERT  Critical Value:  Potassium 2.8  Date & Time Notied:  06/29/17 1920  Provider Notified: [place]  Orders Received/Actions taken: awaiting orders

## 2017-06-29 NOTE — Progress Notes (Signed)
[name] is holding her own right now.  There may have been some issue this morning with respect to having some neurological changes.  I saw her this morning.  She seemed to be at her baseline.  I really do not detect any type of facial droop.  She had no slurred speech.  She was weak but symmetric bilaterally.  Potassium was only 2.7.  This may account for some of the neurological issues.  Her calcium was 5.8.  Her hemoglobin was 7.8.  Her platelet count was 33,000.  I will go ahead and give her 2 units of blood.  I really cannot see any obvious neurological deficits.  Her phosphate was 1.3.  She seems to be eating a little bit better.  She is not having obvious diarrhea.  She has had no bleeding.  I am still awaiting the identification of her gram-negative rod and gram-positive cocci in her blood.  She is on broad-spectrum coverage.  She has had no temperature.  Her pulse is 101.  Her blood pressure is 121/62.  Her oral exam is slightly dry.  There is no mucositis.  Lungs are clear bilaterally.  Cardiac exam is tachycardic but regular.  Abdomen is soft.  She has good bowel sounds.  There is no guarding or rebound tenderness.  Extremities shows some muscle atrophy in upper and lower extremities.  Skin exam shows some ecchymoses that are scattered.  Neurological exam shows no obvious or focal neurological deficits.  [name] has bacteremia.  She has both gram-negative rods and gram-positive cocci in her blood.  Hopefully, the ID will be out today.  We will replace her potassium.  We will replace her phosphorus.  We will replace her calcium.  She will also get the blood transfusion.  Thankfully, she is not neutropenic.  I know that she is getting outstanding care from the staff down in the cardiac ICU.  She and her boyfriend are very happy with the compassion and professionalism of the staff in the cardiac ICU.  Lattie Haw, MD  Romans 8:18

## 2017-06-29 NOTE — Progress Notes (Signed)
PCCM INTERVAL PROGRESS NOTE  Called to bedside to evaluate patient for slurred speech. Rn reports that patient had some slurred speech this AM and potentially a facial droop on the L. Patient feels as though her mouth is dry, and once it was swabbed out she feels better. RN and patients boyfriend feel as though her symptoms have improved since her mouth has been swabbed. I was fortunate enough to evaluate the patient alongside Dr. Marin Olp, her oncologist. He knows her well and feels as though her speech/face is no different from normal. On exam she does not appear to have a facial droop to me, nor is she slurring her speech. Smile and facial movements are symmetric and tongue protrudes midline.   Plan: Continue to monitor.  Georgann Housekeeper, AGACNP-BC [facility] Pulmonology/Critical Care Pager [phone] or [phone]  06/29/2017 5:52 AM

## 2017-06-29 NOTE — Evaluation (Signed)
Occupational Therapy Evaluation Patient Details Name: [name] MRN: [id number] DOB: 07-29-1955 Today's Date: 06/29/2017    History of Present Illness Pt is a 62 y/o female admitted secondary to nosebleeds and presenting with thrombocytopenia . 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   Clinical Impression   Pt is typically independent. She demonstrates poor activity tolerance and mild unsteadiness with ambulation. She requires min guard assist for standing ADL. Began instruction in energy conservation. Will follow.    Follow Up Recommendations  Home health OT    Equipment Recommendations  3 in 1 bedside commode;Tub/shower seat    Recommendations for Other Services       Precautions / Restrictions Precautions Precautions: None Precaution Comments: fatigues easily Restrictions Weight Bearing Restrictions: No      Mobility Bed Mobility Overal bed mobility: Modified Independent                Transfers Overall transfer level: Needs assistance Equipment used: None Transfers: Sit to/from Stand Sit to Stand: Min guard         General transfer comment: min guard for safety    Balance Overall balance assessment: Needs assistance   Sitting balance-Leahy Scale: Good       Standing balance-Leahy Scale: Fair                             ADL either performed or assessed with clinical judgement   ADL Overall ADL's : Needs assistance/impaired Eating/Feeding: Independent;Bed level   Grooming: Wash/dry hands;Standing;Min guard   Upper Body Bathing: Set up;Sitting   Lower Body Bathing: Min guard;Sit to/from stand   Upper Body Dressing : Set up;Sitting   Lower Body Dressing: Min guard;Sit to/from stand    Toilet Transfer: Min guard;Ambulation   Toileting- Clothing Manipulation and Hygiene: Min guard;Sit to/from stand       Functional mobility during ADLs: Min guard(pushed IV pole) General ADL Comments: began education in energy conservation, benefits of 3 in 1/shower seat.     Vision Baseline Vision/History: Wears glasses Wears Glasses: At all times Patient Visual Report: No change from baseline       Perception     Praxis      Pertinent Vitals/Pain Pain Assessment: No/denies pain     Hand Dominance Right   Extremity/Trunk Assessment Upper Extremity Assessment Upper Extremity Assessment: Overall WFL for tasks assessed   Lower Extremity Assessment Lower Extremity Assessment: Defer to PT evaluation   Cervical / Trunk Assessment Cervical / Trunk Assessment: Normal   Communication Communication Communication: No difficulties   Cognition Arousal/Alertness: Awake/alert Behavior During Therapy: WFL for tasks assessed/performed Overall Cognitive Status: Impaired/Different from baseline                                 General Comments: needs ongoing evaluation, somewhat tangential at times   General Comments       Exercises     Shoulder Instructions      Home Living Family/patient expects to be discharged to:: Private residence Living Arrangements: Spouse/significant other Available Help at Discharge: Family;Available 24 hours/day Type of Home: House Home Access: Stairs to enter [employer] of Steps: 2   Home Layout: One level  Bathroom Toilet: Standard     Home Equipment: None   Additional Comments: boyfriend works nights      Prior Functioning/Environment Level of Independence: Independent        Comments: pt is concerned that she will have a bad day and not have anyone to care for her        OT Problem List: Decreased activity tolerance;Cardiopulmonary status limiting activity      OT  Treatment/Interventions: Self-care/ADL training;DME and/or AE instruction;Patient/family education;Energy conservation    OT Goals(Current goals can be found in the care plan section) Acute Rehab OT Goals Patient Stated Goal: return home OT Goal Formulation: With patient Time For Goal Achievement: 07/13/17 Potential to Achieve Goals: Good ADL Goals Pt Will Perform Grooming: Independently;standing Pt Will Transfer to Toilet: Independently;ambulating Pt Will Perform Toileting - Clothing Manipulation and hygiene: Independently;sit to/from stand Additional ADL Goal #1: Pt will state at least 3 energy conservation strategies as instructed.  OT Frequency: Min 2X/week   Barriers to D/C: Decreased caregiver support          Co-evaluation              AM-PAC PT "6 Clicks" Daily Activity     Outcome Measure Help from another person eating meals?: None Help from another person taking care of personal grooming?: A Little Help from another person toileting, which includes using toliet, bedpan, or urinal?: A Little Help from another person bathing (including washing, rinsing, drying)?: A Little Help from another person to put on and taking off regular upper body clothing?: None Help from another person to put on and taking off regular lower body clothing?: A Little 6 Click Score: 20   End of Session Equipment Utilized During Treatment: Gait belt  Activity Tolerance: Patient limited by fatigue Patient left: in bed;with call bell/phone within reach  OT Visit Diagnosis: Unsteadiness on feet (R26.81)                Time: 1421-1440 OT Time Calculation (min): 19 min Charges:  OT General Charges $OT Visit: 1 Visit OT Evaluation $OT Eval Moderate Complexity: 1 Mod G-Codes:     07-06-17 Nestor Lewandowsky, OTR/L Pager: [phone] Werner Lean Haze Boyden 06-Jul-2017, 4:29 PM

## 2017-06-29 NOTE — Progress Notes (Signed)
FPTS Interim Progress Note  Afternoon BMP and post transfusion H&H resulted with the following:    Hypokalemia: Potassium is 2.8.  We will give 40 mEq of K-Dur  Hypocalcemia: Calcium of 5.6, 7.6 when corrected for hypoalbuminemia.  We will give 1 g of calcium gluconate.  We will also check magnesium as this can be associated with hypocalcemia (of note magnesium was normal at 3 AM this morning)  Bicarb of 21, stable from earlier this morning.  Anion gap of 13 when calculated with corrected sodium.  Will discontinue sodium bicarb infusion as non anion gap acidosis has improved   Anemia: Hemoglobin greatly improved from 7.8 to 11.2 after 2 units of blood were given  Thrombocytopenia: Platelets decreased from 33 to 22  Will obtain repeat CMP and CBC tomorrow morning  Additionally patient has been having multiple loose bowel movements, will check C. Diff   Appreciate excellent RN care  Carlyle Dolly, MD 06/29/2017, 7:50 PM PGY-3, [place] [facility] pager [phone]

## 2017-06-29 NOTE — Progress Notes (Signed)
CRITICAL VALUE ALERT  Critical Value:  Platelets 22  Date & Time Notied:  06/29/17 1920  Provider Notified: [place]   Orders Received/Actions taken: awaiting orders

## 2017-06-29 NOTE — Significant Event (Signed)
Rapid Response Event Note  Overview:Called to patient's room with concern's for possible stroke.  Per RN, pt speech altered, pt slower to respond, and L facial droop present. Time Called: 0515 Arrival Time: 0520 Event Type: Neurologic  Initial Focused Assessment: Pt sitting up in bed, alert and oriented.  Skin warm and dry.  VS: HR-98, BP-114/66, RR-14, SpO2-94% on 3L [place].  CBG-150, NIH-1 for mild L facial droop.  Pt stating that she needs water for her "mouth to work."  Pt's mouth swabbed with mouthwash and pt's L droop lessened and pt began talking more.  Per pt, when her mouth gets dry she can't talk well.    Interventions: Dr. Marin Olp to bedside to speak with patient.  Per MD, she has no neurological deficits.  Georgann Housekeeper, NP to bedside as well to assess pt.  Plan of Care (if not transferred): Continue to monitor pt.  Call RRT if further assistance needed.  Event Summary: Name of Physician Notified: Georgann Housekeeper, NP at [phone]  Name of Consulting Physician Notified: Dr. Marin Olp at 0540(MD rounding on pt)  Outcome: Stayed in room and stabalized  Event End Time: 0254  Dillard Essex

## 2017-06-29 NOTE — Progress Notes (Addendum)
Family Medicine Teaching Service Daily Progress Note Intern Pager: [phone]  Patient name: [name] Medical record number: [id number] Date of birth: [birth year] Age: 62 y.o. Gender: female  Primary Care Provider: Nuala Alpha, DO Consultants: hematology, palliative care  Code Status: Full   Pt Overview and Major Events to Date:  Admitted to [facility] on 06/26/17 for thromboyctopenia  Transferred to ICU on 06/27/17 Transferred back to [place] on 06/29/17  Assessment and Plan: Daci Templetonis a 62 y.o.femalepresenting with thrombocytopenia. PMH is significant forStage IV adenocarcinoma of unknown primary versus NSCLC, recent PE with bilateral DVT (currently on Eliquis)bony metastasis throughout spine,ribs, sacrum, tobacco abuse, chronic pancreatitis s/p biliary stenting, MDD, anxiety, alcohol abuse, probable COPD  Pseudomonal Bacteremia with septic shock  Blood cultures growing pseudomonas auriginosa. Currently afebrile. WBC wnl at 5.3. LA downtrending from 3.5>2.8. S/p vanc/zosyn (3/10-3/11) for broad spectrum coverage -continue cefepime (3/11-) -continue to follow cultures for sensitivities -continue stress dose steroids, wean gradually for 5 days.   Thrombocytopenia with anemia Platelets at 33, improved from 15 on 3/11. Hgb decreased to 7.8. Likely multifactorial with recent initiation of chemotherapy along with likely bone marrow infiltration by malignancy. S/p 3 Units platelets.   -hematology consulted, appreciate recommendations: recommend half dose Eliquis at 2.5 mg bid, outpatient ENT follow up -blood smear pending  -monitor on daily CBC -2U PRBCs per Dr. Marin Olp -post transfusion H/H  Chest pain-resolved Patient reported chest tightness to RN (did not report any pain to me). Possible component of anxiety as patient was very tearful on exam. Troponins trended at 0.03>0.03>0.04  Recent PE with bilateral DVT Recently admitted in February for PE in RLL involving  segmental and more peripehral branches and found to have bilateral DVTs. Hypercoagulable given h/o malignancy. Patient was treated with Eliquis and was instructed to continue Eliquis 5mg  bid x 3 months. -monitor for s/sx of PE -continue 1/2 dose Eliquis per hematology recommendations   Stage IV Right Lung Cancer Adenocarcinoma of unknown primary vsNSCLC with systemic bony metastatic disease. S/p 1 mo of radiation therapy(last ttx 2/21). She has also had a Port-A-Cath placed on right chest 2/20. Possible abdominal mets seen on CT abdomen on 2/13. No mets seen on brain MR 2/17. Receives 50-75 mcg of fentanyl via 72-hour patch and 20 oxycodone every12hours as needed, dilaudid 2mg  q4hrs prn.Chemotherapy initiated on 06/21/17 -Continue homepain regimen -Continue with outpatient management -Bowel regimen Colace and MiraLAX -Wound care per nursing for Port-A-Cath incision -palliative care consultation, appreciate recommendations: recommend possible change in pain regimen, continue zofran with addition of compazine  Hypokalemia K of 2.7.  -repleted with KCl  -will also replete with KDur -recheck BMP @ 3pm  Hypocalcemia Ca of 5.8 with albumin of 1.5 Corrected Ca 7.8  -will replete with calcium gluconate 2g -recheck BMP @ 3pm  Hyponatremia Na of 131, 126 on admission. Likely 2/2 poor PO intake.  -NS @ 100cc, goal to increase by 4-24meq in 24 hours -recheck BMP @ 3pm  Hypophosphatemia Phosphorus of 1.3 -will replete phosphorus  -recheck BMP @ 3pm  Systemic weakness, worsening Likely due to deconditioning in the setting of metastatic disease. -PT/OT -Nutrition consult  Chronic pancreatitis s/p biliary stenting, stable Likely due to alcohol abuse given patient's history per the chart. Patient has a biliary stent on 11/30. Has been endorsing abdominal pain however abdominal exam is unremarkable. Recent imaging on 2/13 CT abdominal pelvis did not show any acute findings  consistent with pancreatitis.  -continue to monitor abdominal exam  Adjustment disorder with anxious features  Acute.She did not have this before and it has been less than 3 months of symptoms according to the patient.Home meds: ativa 1mg  tid prn, mirtazapine 7.5mg  daily, -continue home meds  Tobacco abuse -Nicotine replacement patches as needed -Nursing to provide smoking cessation counseling -encouraged smoking cessation given supplemental O2 use at home, patient reports that she no longer smokes since starting oxygen  -Patient continues to smoke while using supplemental O2 at home, will need aggressive counseling on dangers of smoking with O2 use  Wound Patient describes vesicular rash on buttock. Vesicles have all been popped on exam.  -wound care consulted, appreciate recommendations   Neurogenic Bladder  Patient reports no urine output and no output charted. Abdomen distended and tender. Possibly 2/2 to increased pain medication  -will order bladder scan  -insert foley catheter, remove in 48hrs  -will stop lasix if no adequate urine output   Protein Calorie Malnutrition  Albumin of 1.5 -nutrition consulted, appreciate recommendations   FEN/GI:regular diet, protonix 40mg  daily Prophylaxis:SCDs  Disposition: continued inpatient stay   Subjective:  Patient today reports feeling a little better. States this morning RN thought she had facial droop but states it was just her mouth being dry and it improved after hydration. Patient states breathing is improved. States this morning she may have had chest pain this morning but has improved. Reports abdominal pain from not urinating.   Objective: Temp:  [97.6 F (36.4 C)-98.5 F (36.9 C)] 97.7 F (36.5 C) (03/12 1137) Pulse Rate:  [95-116] 100 (03/12 1230) Resp:  [13-28] 17 (03/12 1230) BP: (106-173)/(51-108) 133/56 (03/12 1230) SpO2:  [89 %-100 %] 94 % (03/12 0700) Weight:  [162 lb 7.7 oz (73.7 kg)] 162 lb 7.7 oz  (73.7 kg) (03/12 0500) Physical Exam: General: awake and alert, sitting up in chair, NAD, [place] in place  Cardiovascular: RRR, no MRG  Respiratory: CTAB, no wheezes, rales, or rhonchi  Abdomen: firm, distended, bowel sounds present, tender Extremities: no edema, non tender   Laboratory: Recent Labs  Lab 06/27/17 1539 06/28/17 0359 06/29/17 0340  WBC 3.4* 5.0 5.3  HGB 9.1* 9.4* 7.8*  HCT 27.1* 27.7* 23.6*  PLT 22* 15* 33*   Recent Labs  Lab 06/27/17 1239 06/28/17 0359 06/28/17 1753 06/29/17 0340  NA 123* 128* 129* 131*  K 4.1 3.6 2.4* 2.7*  CL 98* 102 101 103  CO2 16* 17* 17* 21*  BUN 15 12 13 11   CREATININE 0.77 0.83 0.68 0.65  CALCIUM 5.2* 5.4* 5.7* 5.8*  PROT 4.8* 5.4*  --  4.9*  BILITOT 0.8 0.4  --  0.5  ALKPHOS 170* 151*  --  136*  ALT 22 37  --  32  AST 27 48*  --  30  GLUCOSE 191* 236* 328* 184*    CBG (last 3)  Recent Labs    06/29/17 0519 06/29/17 0757 06/29/17 1135  GLUCAP 156* 159* 221*    Imaging/Diagnostic Tests: Dg Chest 2 View  Result Date: 06/27/2017 CLINICAL DATA:  Fever. EXAM: CHEST - 2 VIEW COMPARISON:  Chest radiographs and CTA 06/13/2017 FINDINGS: Right jugular Port-A-Cath terminates over the lower SVC. The cardiac silhouette is normal in size. Aortic atherosclerosis is noted. There is a moderate-sized hiatal hernia. The lungs are hyperinflated with underlying emphysema. Peribronchial thickening and coarsened interstitial markings bilaterally are mildly increased compared to the prior radiographs with minimal patchy density in the left perihilar and right lower lungs. Trace pleural effusions are questioned. No pneumothorax is identified. IMPRESSION: COPD with mildly increased  predominantly interstitial type densities bilaterally which may reflect superimposed acute infection, possibly atypical or viral. Electronically Signed   By: Logan Bores M.D.   On: 06/27/2017 07:51   Dg Chest 2 View  Result Date: 06/13/2017 CLINICAL DATA:  Shortness of  breath.  History of lung cancer. EXAM: CHEST  2 VIEW COMPARISON:  August 01, 2014 FINDINGS: No pneumothorax. The heart, hila, and mediastinum are unremarkable. Small hiatal hernia. The nodularity in the right upper lung on the recent PET-CT is not appreciated on this study. IMPRESSION: No cause for shortness of breath or chest pain identified. The no nodularity in the right apex is not well assessed with this study. Electronically Signed   By: Dorise Bullion III M.D   On: 06/13/2017 10:29   Ct Head Wo Contrast  Result Date: 06/27/2017 CLINICAL DATA:  History of lung carcinoma with confusion, initial encounter EXAM: CT HEAD WITHOUT CONTRAST TECHNIQUE: Contiguous axial images were obtained from the base of the skull through the vertex without intravenous contrast. COMPARISON:  06/05/2017 MRI, 03/02/2010 CT of the head FINDINGS: Brain: No evidence of acute infarction, hemorrhage, hydrocephalus, extra-axial collection or mass lesion/mass effect. Vascular: No hyperdense vessel or unexpected calcification. Skull: Changes of hemangiomas are noted similar to that seen on recent MRI. Sinuses/Orbits: Orbits are within normal limits. Mucosal thickening is noted within the maxillary antra bilaterally as well as within the ethmoid and sphenoid sinuses. Small air-fluid level is noted in the right maxillary antrum. Other: None IMPRESSION: Changes of sinusitis. No acute intracranial abnormality is noted. Electronically Signed   By: Inez Catalina M.D.   On: 06/27/2017 18:59   Ct Angio Chest Pe W And/or Wo Contrast  Result Date: 06/13/2017 CLINICAL DATA:  Shortness of breath.  Suspected PE. EXAM: CT ANGIOGRAPHY CHEST WITH CONTRAST TECHNIQUE: Multidetector CT imaging of the chest was performed using the standard protocol during bolus administration of intravenous contrast. Multiplanar CT image reconstructions and MIPs were obtained to evaluate the vascular anatomy. CONTRAST:  75mL ISOVUE-370 IOPAMIDOL (ISOVUE-370) INJECTION 76%  COMPARISON:  Chest CT May 25, 2017. Chest x-ray June 13, 2017 FINDINGS: Cardiovascular: The heart and coronary arteries are unchanged. Atherosclerotic changes are seen in the nonaneurysmal aorta. 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 No dissection. There is narrowing of right upper lobe pulmonary arterial branches due to soft tissue in the right hilum. Pulmonary emboli are seen in the right lung bases involving segmental and more peripheral branches. No other emboli identified. Mediastinum/Nodes: There is soft tissue in the right hilum which encases the right upper lobe bronchus and upper lobe pulmonary arterial branches. This soft tissue measures up to 2.8 cm. No other adenopathy. There is a moderate hiatal hernia. The thyroid is normal. A Port-A-Cath is identified. No effusions. Lungs/Pleura: The spiculated nodule in the right upper lobe measures 1.6 by 1.0 cm today versus 1.4 x 0.9 cm previously. The small difference could be due to difference in slice selection given the short interval. Scattered scarring. Stable 5 mm nodule in the right lung on series 6, image 77. Other nodules are stable as well. No new masses. Mild ground-glass in the medial right lung on series 6, image 102 is new and could be infectious. No new nodules. No other infiltrates. Upper Abdomen: Air in the liver is stable, likely from previous sphincterotomy. Prominence of the left adrenal gland is likely hyperplasia without nodularity. No other abnormalities in the upper abdomen. Musculoskeletal: Bony metastatic disease again identified in the spine, manubrium, sternum, and ribs.  Review of the MIP images confirms the above findings. IMPRESSION: 1. Pulmonary emboli in the right lower lobe involving segmental and more peripheral branches. 2. Mild ground-glass in the medial right lung could be infectious. 3. The spiculated nodule in the right upper lobe is not significantly changed accounting for difference in technique. 4.  Increasing soft tissue in the right hilum with encasement of the right upper lobe bronchus and right upper lobe pulmonary arterial branches resulting in some narrowing. This is suspicious for disease involvement. 5. Known bony metastatic disease. 6. Atherosclerotic changes in the nonaneurysmal aorta. 7. Moderate hiatal hernia. Findings called to and discussed with Dr. Ashok Cordia Aortic Atherosclerosis (ICD10-I70.0). Electronically Signed   By: Dorise Bullion III M.D   On: 06/13/2017 13:30   Mr Brain W Wo Contrast  Result Date: 06/06/2017 CLINICAL DATA:  Non-small cell lung cancer staging EXAM: MRI HEAD WITHOUT AND WITH CONTRAST TECHNIQUE: Multiplanar, multiecho pulse sequences of the brain and surrounding structures were obtained without and with intravenous contrast. CONTRAST:  4mL MULTIHANCE GADOBENATE DIMEGLUMINE 529 MG/ML IV SOLN COMPARISON:  Head CT 03/02/2010 FINDINGS: Brain: The midline structures are normal. There is no acute infarct or acute hemorrhage. No mass lesion, hydrocephalus, dural abnormality or extra-axial collection. The brain parenchymal signal is normal. No age-advanced or lobar predominant atrophy. No chronic microhemorrhage or superficial siderosis. Vascular: Major intracranial arterial and venous sinus flow voids are preserved. Skull and upper cervical spine: There are multiple T1 hyperintense foci within the calvarium, the largest in the right parietal bone. This also shows intrinsic T2 hyperintensity, likely a hemangioma. Sinuses/Orbits: Mild left sphenoid sinus mucosal thickening. No mastoid or middle ear effusion. Normal orbits. IMPRESSION: 1. No intracranial metastatic disease.  Normal brain. 2. 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 Electronically Signed   By: Ulyses Jarred M.D.   On: 06/06/2017 01:26   Ct Abdomen Pelvis W Contrast  Result Date: 06/02/2017 CLINICAL DATA:  Left upper quadrant abdominal pain and distention.  Recently diagnosed with metastatic adenocarcinoma of unknown primary. Evaluate for diverticulitis. EXAM: CT ABDOMEN AND PELVIS WITH CONTRAST TECHNIQUE: Multidetector CT imaging of the abdomen and pelvis was performed using the standard protocol following bolus administration of intravenous contrast. CONTRAST:  117mL ISOVUE-300 IOPAMIDOL (ISOVUE-300) INJECTION 61% COMPARISON:  PET-CT 05/26/2017. Abdominopelvic CT 05/25/2017 and 03/25/2016. FINDINGS: Lower chest: Patchy ground-glass opacities at both lung bases are stable. There is no significant pleural effusion. Moderate-sized hiatal hernia again noted. Hepatobiliary: The liver is normal in density without focal abnormality. Stable mild biliary dilatation and pneumobilia status post cholecystectomy. Pancreas: Multiple calcifications are present within the pancreatic head. There is a pancreatic stent in place with a small amount of air in the main pancreatic duct. The pancreatic body and tail appear normal. No acute surrounding inflammation. Spleen: Scattered small granulomas.  Normal in size. Adrenals/Urinary Tract: Stable generalized enlargement of the left adrenal gland consistent with hyperplasia. The right adrenal gland appears normal. Tiny low-density left renal lesions remain too small to characterize, but stable. No hydronephrosis or urinary tract calculus. The bladder appears normal. Stomach/Bowel: As above, moderate-size hiatal hernia. The stomach and small bowel appear normal. The proximal colon is fluid-filled. There are diverticular changes of the sigmoid colon with a fluid-filled inferiorly projecting diverticulum on axial image 64. There may be minimal inflammation in the surrounding fat. No evidence of bowel obstruction. Vascular/Lymphatic: There are no enlarged abdominal or pelvic lymph nodes. Scattered small retroperitoneal lymph nodes are stable. There is diffuse aortic  and branch vessel atherosclerosis. No acute vascular findings. Reproductive:  The uterus and ovaries appear normal. No adnexal mass. Probable pelvic floor laxity. Other: There is an enhancing soft tissue nodule in the anterior abdominal wall near the umbilicus, measuring 16 mm on image 41. Mildly hypermetabolic on recent PET-CT, probably a metastasis. No ascites, free air or peritoneal nodularity. Musculoskeletal: Multifocal osseous metastatic disease, similar to recent studies. No pathologic fracture identified. IMPRESSION: 1. Possible mild sigmoid diverticulitis. No evidence of perforation, abscess or obstruction. 2. No other evidence of acute process. 3. Known metastatic disease to the bones. Probable enhancing metastasis within the anterior abdominal wall near the umbilicus. 4. Stable incidental findings including a moderate size hiatal hernia, mild biliary dilatation status post cholecystectomy, sequela of chronic calcific pancreatitis, left adrenal hyperplasia and Aortic Atherosclerosis (ICD10-I70.0). Electronically Signed   By: Richardean Sale M.D.   On: 06/02/2017 15:48   Ir US Guide Vasc Access Right  Result Date: 06/09/2017 CLINICAL DATA:  Lung cancer EXAM: TUNNEL POWER PORT PLACEMENT WITH SUBCUTANEOUS POCKET UTILIZING ULTRASOUND & FLOUROSCOPY FLUOROSCOPY TIME:  30 seconds.  Five mGy. MEDICATIONS AND MEDICAL HISTORY: Versed 4 mg, Fentanyl 100 mcg. Additional Medications: Ancef 2 g. Antibiotics were given within 2 hours of the procedure. ANESTHESIA/SEDATION: Moderate sedation time: 26 minutes. Nursing monitored the the patient during the procedure. PROCEDURE: After written informed consent was obtained, patient was placed in the supine position on angiographic table. The right neck and chest was prepped and draped in a sterile fashion. Lidocaine was utilized for local anesthesia. The right jugular vein was noted to be patent initially with ultrasound. Under sonographic guidance, a micropuncture needle was inserted into the right IJ vein (Ultrasound and fluoroscopic image  documentation was performed). The needle was removed over an 018 wire which was exchanged for a Amplatz. This was advanced into the IVC. An 8-French dilator was advanced over the Amplatz. A small incision was made in the right upper chest over the anterior right second rib. Utilizing blunt dissection, a subcutaneous pocket was created in the caudal direction. The pocket was irrigated with a copious amount of sterile normal saline. The port catheter was tunneled from the chest incision, and out the neck incision. The reservoir was inserted into the subcutaneous pocket and secured with two 3-0 Ethilon stitches. A peel-away sheath was advanced over the Amplatz wire. The port catheter was cut to measure length and inserted through the peel-away sheath. The peel-away sheath was removed. The chest incision was closed with 3-0 Vicryl interrupted stitches for the subcutaneous tissue and a running of 4-0 Vicryl subcuticular stitch for the skin. The neck incision was closed with a 4-0 Vicryl subcuticular stitch. Derma-bond was applied to both surgical incisions. The port reservoir was flushed and instilled with heparinized saline. No complications. FINDINGS: A right IJ vein Port-A-Cath is in place with its tip at the cavoatrial junction. COMPLICATIONS: None IMPRESSION: Successful 8 French right internal jugular vein power port placement with its tip at the SVC/RA junction. Electronically Signed   By: Marybelle Killings M.D.   On: 06/09/2017 16:41   Dg Chest Port 1 View  Result Date: 06/28/2017 CLINICAL DATA:  Septic shock.  History of lung carcinoma EXAM: PORTABLE CHEST 1 VIEW COMPARISON:  June 27, 2017 chest radiograph and chest CT June 13, 2017 FINDINGS: There is diffuse interstitial prominence consistent with either pulmonary edema or lymphangitic spread of tumor. There is atelectatic change in the left base with questionable airspace consolidation immediately adjacent to a hiatal hernia.  Heart is upper normal in size.  There is mild pulmonary venous hypertension. There is aortic atherosclerosis. There is no adenopathy apparent by radiography. Central catheter tip is in the superior vena cava near the cavoatrial junction. No pneumothorax. Bony metastases are better appreciated on recent CT than on this study, although several ribs show evidence suggesting bony metastatic disease. Moderate hiatal hernia noted. There is aortic atherosclerosis. IMPRESSION: Interstitial prominence. Question a degree of pulmonary edema versus lymphangitic spread of tumor. Both entities may exist concurrently. Note that there is pulmonary venous hypertension. Question atelectasis versus early pneumonia left base. Moderate hiatal hernia. Stable cardiac silhouette. Bony metastatic disease, better seen on recent CT. There is aortic atherosclerosis. Aortic Atherosclerosis (ICD10-I70.0). Electronically Signed   By: Lowella Grip III M.D.   On: 06/28/2017 07:55   Mm Screening Breast Tomo Bilateral  Result Date: 06/10/2017 CLINICAL DATA:  Screening. EXAM: DIGITAL SCREENING BILATERAL MAMMOGRAM WITH TOMO AND CAD COMPARISON:  Previous exam(s). ACR Breast Density Category c: The breast tissue is heterogeneously dense, which may obscure small masses. FINDINGS: There are no findings suspicious for malignancy. Images were processed with CAD. IMPRESSION: No mammographic evidence of malignancy. A result letter of this screening mammogram will be mailed directly to the patient. RECOMMENDATION: Screening mammogram in one year. (Code:[id number]) BI-RADS CATEGORY  1: Negative. Electronically Signed   By: Lajean Manes M.D.   On: 06/10/2017 09:40   Ir Fluoro Guide Port Insertion Right  Result Date: 06/09/2017 CLINICAL DATA:  Lung cancer EXAM: TUNNEL POWER PORT PLACEMENT WITH SUBCUTANEOUS POCKET UTILIZING ULTRASOUND & FLOUROSCOPY FLUOROSCOPY TIME:  30 seconds.  Five mGy. MEDICATIONS AND MEDICAL HISTORY: Versed 4 mg, Fentanyl 100 mcg. Additional Medications: Ancef 2  g. Antibiotics were given within 2 hours of the procedure. ANESTHESIA/SEDATION: Moderate sedation time: 26 minutes. Nursing monitored the the patient during the procedure. PROCEDURE: After written informed consent was obtained, patient was placed in the supine position on angiographic table. The right neck and chest was prepped and draped in a sterile fashion. Lidocaine was utilized for local anesthesia. The right jugular vein was noted to be patent initially with ultrasound. Under sonographic guidance, a micropuncture needle was inserted into the right IJ vein (Ultrasound and fluoroscopic image documentation was performed). The needle was removed over an 018 wire which was exchanged for a Amplatz. This was advanced into the IVC. An 8-French dilator was advanced over the Amplatz. A small incision was made in the right upper chest over the anterior right second rib. Utilizing blunt dissection, a subcutaneous pocket was created in the caudal direction. The pocket was irrigated with a copious amount of sterile normal saline. The port catheter was tunneled from the chest incision, and out the neck incision. The reservoir was inserted into the subcutaneous pocket and secured with two 3-0 Ethilon stitches. A peel-away sheath was advanced over the Amplatz wire. The port catheter was cut to measure length and inserted through the peel-away sheath. The peel-away sheath was removed. The chest incision was closed with 3-0 Vicryl interrupted stitches for the subcutaneous tissue and a running of 4-0 Vicryl subcuticular stitch for the skin. The neck incision was closed with a 4-0 Vicryl subcuticular stitch. Derma-bond was applied to both surgical incisions. The port reservoir was flushed and instilled with heparinized saline. No complications. FINDINGS: A right IJ vein Port-A-Cath is in place with its tip at the cavoatrial junction. COMPLICATIONS: None IMPRESSION: Successful 8 French right internal jugular vein power port  placement with its tip at the SVC/RA  junction. Electronically Signed   By: Marybelle Killings M.D.   On: 06/09/2017 16:41     Caroline More, DO 06/29/2017, 1:56 PM PGY-1, [place] Intern pager: [phone], text pages welcome

## 2017-06-29 NOTE — Progress Notes (Signed)
Family Medicine Teaching Service Daily Progress Note Intern Pager: [phone]  Patient name: [name] Medical record number: [id number] Date of birth: 08/23/1955 Age: 62 y.o. Gender: female  Primary Care Provider: Nuala Alpha, DO Consultants: Hematology, palliative care, CCM, ID Code Status: Full   Pt Overview and Major Events to Date:  Admitted to [facility] on 06/26/17 for thromboyctopenia  Transferred to ICU on 06/27/17 Transferred back to [facility] on 06/29/17  Assessment and Plan: [name] Templetonis a 62 y.o.femalepresenting with thrombocytopenia. PMH is significant forStage IV adenocarcinoma of unknown primary versus NSCLC, recent PE with bilateral DVT (currently on Eliquis)bony metastasis throughout spine,ribs, sacrum, tobacco abuse, chronic pancreatitis s/p biliary stenting, MDD, anxiety, alcohol abuse, probable COPD  Acute on chronic hypoxemic respiratory failure  2/2 pulmonary edema. Patient with worsening respiratory status and needed to be placed on venturi mask @14L . Patient with worsening edema to hands and lower extremities as well. Worsening lung exam. ABG showing 7.482/33/59.6/24.5. CXR showing interstitial changes consistent with edema.  -will stop IVF -CCM consulted, appreciate recommendations: lasix 40mg  IV q6 x 3doses. Have signed off   Pseudomonal Bacteremia with septic shock Blood cultures growing pseudomonas auriginosa. Currently afebrile. WBC 4.1. LA downtrending from 3.5>2.8. S/p vanc/zosyn (3/10-3/11) for broad spectrum coverage -ID consulted, appreciate recommendations: cefepime x 10 days total -continue cefepime (3/11-), per ID will not need double coverage with gentamicin. Recommend continuing cefepime till 3/19 -continue to follow cultures for sensitivities -continue stress dose steroids, wean gradually for 5 days.   Thrombocytopenia with anemia Platelets continue to drop, with am platelets at 14. Hgb decreased to 9.6. Likely multifactorial with  recent initiation of chemotherapy along with likely bone marrow infiltration by malignancy. S/p 3 Units platelets and 2 Units PRBCs. Blood smear showing immature granulocytes.  -hematology consulted, appreciate recommendations: recommend half dose Eliquis at 2.5 mg bid, outpatient ENT follow up -monitor on daily CBC -transfuse per hematology recommendations, transfusion planned today    Loose stools Patient reporting multiple loose bowel movements. C diff panel negative -will discontinue bowel regimen  -continue to monitor   Recent PE with bilateralDVT Stable. Recently admitted in February for PE in [facility] involving segmental and more peripehral branches and found to have bilateral DVTs. Hypercoagulable given h/o malignancy. Patient was treated with Eliquis and was instructed to continue Eliquis5mg  bidx 3 months. -monitor for s/sx of PE -continue 1/2 dose Eliquis per hematology recommendations  Stage IV Right Lung Cancer Adenocarcinoma of unknown primary vsNSCLC with systemic bony metastatic disease. S/p 1 mo of radiation therapy(last ttx 2/21). She has also had a Port-A-Cath placed on right chest 2/20. Possible abdominal mets seen on CT abdomen on 2/13. No mets seen on brain MR 2/17. Receives 50-75 mcg of fentanyl via 72-hour patch and 20 oxycodone every12hours as needed, dilaudid 2mg  q4hrs prn.Chemotherapy initiated on 06/21/17 -Continue homepain regimen -Continue with outpatient management -Bowel regimen Colace and MiraLAX held for loose stools -Wound care per nursing for Port-A-Cath incision -palliative care consultation, appreciate recommendations: recommend possible change in pain regimen, continue zofran with addition of compazine  Hypokalemia K of 2.4. Mg of 1.9 -KCL 37meq IV  -Replete Mg with 4g IV   Hypocalcemia Ca of 5.6 with albumin of 1.4. Corrected Ca 7.7. Mg of 1.9 -will replete as needed   Hyponatremia Na of130, 126 on admission. Likely 2/2 poor PO  intake.  -discontinued NS @100cc  due to edema -goal to increase by 4-39meq in 24 hours  Hypophosphatemia Phosphorus of 1.0 -will replete phosphorus with KPhos 1mmol IV  Systemic weakness, worsening Likely due to deconditioning in the setting of metastatic disease. -PT/OT -Nutrition consult  Chronic pancreatitis s/p biliary stenting, stable Likely due to alcohol abuse given patient's history per the chart. Patient has a biliary stent on 11/30. Has been endorsing abdominal pain however abdominal exam is unremarkable. Recent imaging on 2/13 CT abdominal pelvis did not show any acute findings consistent with pancreatitis.  -continue to monitor abdominal exam  Adjustment disorder with anxious features Acute.She did not have this before and it has been less than 3 months of symptoms according to the patient.Home meds: ativa 1mg  tid prn, mirtazapine 7.5mg  daily, -continue home meds  Tobacco abuse -Nicotine replacement patches as needed -Nursing to provide smoking cessation counseling -encouraged smoking cessation given supplemental O2 use at home, patient reports that she no longer smokes since starting oxygen -Patient continues to smoke while using supplemental O2 at home, will need aggressive counseling on dangers of smoking with O2 use  Wound Patient describes vesicular rash on buttock. Vesicles have all been popped on exam.  -wound care consulted, appreciate recommendations   Neurogenic Bladder  Patient reports no urine output and no output charted. Abdomen distended and tender. Possibly 2/2 to increased pain medication  -maintain foley catheter, remove in 24hrs   Protein Calorie Malnutrition  Albumin of 1.4 -nutrition consulted, appreciate recommendations   FEN/GI:regular diet, protonix 40mg  daily Prophylaxis:SCDs  Disposition: continued inpatient stay   Subjective:  Patient today reports feeling "ok". Intermittently falling asleep during exam. When  asked if patient was doing ok after falling asleep patient reports "I don't know what you want me to say? I am fine". Per [name], patients partner/POA, patient has been falling asleep all morning. [name] still hopeful for patient's recovery.   Objective: Temp:  [97.6 F (36.4 C)-98.8 F (37.1 C)] 98.3 F (36.8 C) (03/13 1600) Pulse Rate:  [90-114] 101 (03/13 1600) Resp:  [14-27] 21 (03/13 1600) BP: (112-169)/(51-152) 148/63 (03/13 1600) SpO2:  [90 %-95 %] 92 % (03/13 0626) FiO2 (%):  [55 %] 55 % (03/13 1330) Weight:  [163 lb 5.8 oz (74.1 kg)] 163 lb 5.8 oz (74.1 kg) (03/13 0500) Physical Exam: General: intermittently falling asleep, orientedx3, venturi mask in place  Cardiovascular: RRR, no MRG Respiratory: course crackles throughout, increased work of breathing Abdomen: soft, non tender, non distended, bowel sounds normal  Extremities: 2+ edema in lower extremities, 1+ edema in hands bilaterally, non tender   Laboratory: Recent Labs  Lab 06/29/17 0340 06/29/17 1751 06/30/17 0327 06/30/17 0705  WBC 5.3 5.5 4.1  --   HGB 7.8* 11.2* 9.6*  --   HCT 23.6* 32.1* 27.9*  --   PLT 33* 22* 14* 14*   Recent Labs  Lab 06/28/17 0359  06/29/17 0340 06/29/17 1751 06/30/17 0327 06/30/17 1300  NA 128*   < > 131* 132* 130* 130*  K 3.6   < > 2.7* 2.8* 2.4* 3.1*  CL 102   < > 103 101 100* 96*  CO2 17*   < > 21* 21* 21* 22  BUN 12   < > 11 10 9 9   CREATININE 0.83   < > 0.65 0.69 0.61 0.71  CALCIUM 5.4*   < > 5.8* 5.6* 5.6* 5.7*  PROT 5.4*  --  4.9*  --  4.7*  --   BILITOT 0.4  --  0.5  --  0.4  --   ALKPHOS 151*  --  136*  --  136*  --   ALT 37  --  32  --  28  --   AST 48*  --  30  --  25  --   GLUCOSE 236*   < > 184* 283* 267* 298*   < > = values in this interval not displayed.     Ref. Range 06/29/2017 19:40  Magnesium Latest Ref Range: 1.7 - 2.4 mg/dL 1.9    Ref. Range 06/29/2017 21:20  C Diff antigen Latest Ref Range: NEGATIVE  NEGATIVE  C Diff interpretation Unknown No C.  difficile d...  C Diff toxin Latest Ref Range: NEGATIVE  NEGATIVE     Ref. Range 06/30/2017 09:05  Sample type Unknown ARTERIAL DRAW  Delivery systems Unknown VENTURI MASK  FIO2 Unknown 40.00  pH, Arterial Latest Ref Range: 7.350 - 7.450  7.482 (H)  pCO2 arterial Latest Ref Range: 32.0 - 48.0 mmHg 33.0  pO2, Arterial Latest Ref Range: 83.0 - 108.0 mmHg 59.6 (L)  Acid-Base Excess Latest Ref Range: 0.0 - 2.0 mmol/L 1.2  Bicarbonate Latest Ref Range: 20.0 - 28.0 mmol/L 24.5  O2 Saturation Latest Units: % 93.4  Patient temperature Unknown 98.3  Collection site Unknown RIGHT RADIAL  Allens test (pass/fail) Latest Ref Range: PASS  PASS   Imaging/Diagnostic Tests: Dg Chest 2 View  Result Date: 06/27/2017 CLINICAL DATA:  Fever. EXAM: CHEST - 2 VIEW COMPARISON:  Chest radiographs and CTA 06/13/2017 FINDINGS: Right jugular Port-A-Cath terminates over the lower SVC. The cardiac silhouette is normal in size. Aortic atherosclerosis is noted. There is a moderate-sized hiatal hernia. The lungs are hyperinflated with underlying emphysema. Peribronchial thickening and coarsened interstitial markings bilaterally are mildly increased compared to the prior radiographs with minimal patchy density in the left perihilar and right lower lungs. Trace pleural effusions are questioned. No pneumothorax is identified. IMPRESSION: COPD with mildly increased predominantly interstitial type densities bilaterally which may reflect superimposed acute infection, possibly atypical or viral. Electronically Signed   By: Logan Bores M.D.   On: 06/27/2017 07:51   Dg Chest 2 View  Result Date: 06/13/2017 CLINICAL DATA:  Shortness of breath.  History of lung cancer. EXAM: CHEST  2 VIEW COMPARISON:  August 01, 2014 FINDINGS: No pneumothorax. The heart, hila, and mediastinum are unremarkable. Small hiatal hernia. The nodularity in the right upper lung on the recent PET-CT is not appreciated on this study. IMPRESSION: No cause for  shortness of breath or chest pain identified. The no nodularity in the right apex is not well assessed with this study. Electronically Signed   By: Dorise Bullion III M.D   On: 06/13/2017 10:29   Ct Head Wo Contrast  Result Date: 06/27/2017 CLINICAL DATA:  History of lung carcinoma with confusion, initial encounter EXAM: CT HEAD WITHOUT CONTRAST TECHNIQUE: Contiguous axial images were obtained from the base of the skull through the vertex without intravenous contrast. COMPARISON:  06/05/2017 MRI, 03/02/2010 CT of the head FINDINGS: Brain: No evidence of acute infarction, hemorrhage, hydrocephalus, extra-axial collection or mass lesion/mass effect. Vascular: No hyperdense vessel or unexpected calcification. Skull: Changes of hemangiomas are noted similar to that seen on recent MRI. Sinuses/Orbits: Orbits are within normal limits. Mucosal thickening is noted within the maxillary antra bilaterally as well as within the ethmoid and sphenoid sinuses. Small air-fluid level is noted in the right maxillary antrum. Other: None IMPRESSION: Changes of sinusitis. No acute intracranial abnormality is noted. Electronically Signed   By: Inez Catalina M.D.   On: 06/27/2017 18:59   Ct Angio Chest Pe W And/or Wo Contrast  Result  Date: 06/13/2017 CLINICAL DATA:  Shortness of breath.  Suspected PE. EXAM: CT ANGIOGRAPHY CHEST WITH CONTRAST TECHNIQUE: Multidetector CT imaging of the chest was performed using the standard protocol during bolus administration of intravenous contrast. Multiplanar CT image reconstructions and MIPs were obtained to evaluate the vascular anatomy. CONTRAST:  64mL ISOVUE-370 IOPAMIDOL (ISOVUE-370) INJECTION 76% COMPARISON:  Chest CT May 25, 2017. Chest x-ray June 13, 2017 FINDINGS: Cardiovascular: The heart and coronary arteries are unchanged. Atherosclerotic changes are seen in the nonaneurysmal aorta. 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 No dissection. There is narrowing of right  upper lobe pulmonary arterial branches due to soft tissue in the right hilum. Pulmonary emboli are seen in the right lung bases involving segmental and more peripheral branches. No other emboli identified. Mediastinum/Nodes: There is soft tissue in the right hilum which encases the right upper lobe bronchus and upper lobe pulmonary arterial branches. This soft tissue measures up to 2.8 cm. No other adenopathy. There is a moderate hiatal hernia. The thyroid is normal. A Port-A-Cath is identified. No effusions. Lungs/Pleura: The spiculated nodule in the right upper lobe measures 1.6 by 1.0 cm today versus 1.4 x 0.9 cm previously. The small difference could be due to difference in slice selection given the short interval. Scattered scarring. Stable 5 mm nodule in the right lung on series 6, image 77. Other nodules are stable as well. No new masses. Mild ground-glass in the medial right lung on series 6, image 102 is new and could be infectious. No new nodules. No other infiltrates. Upper Abdomen: Air in the liver is stable, likely from previous sphincterotomy. Prominence of the left adrenal gland is likely hyperplasia without nodularity. No other abnormalities in the upper abdomen. Musculoskeletal: Bony metastatic disease again identified in the spine, manubrium, sternum, and ribs. Review of the MIP images confirms the above findings. IMPRESSION: 1. Pulmonary emboli in the right lower lobe involving segmental and more peripheral branches. 2. Mild ground-glass in the medial right lung could be infectious. 3. The spiculated nodule in the right upper lobe is not significantly changed accounting for difference in technique. 4. Increasing soft tissue in the right hilum with encasement of the right upper lobe bronchus and right upper lobe pulmonary arterial branches resulting in some narrowing. This is suspicious for disease involvement. 5. Known bony metastatic disease. 6. Atherosclerotic changes in the nonaneurysmal aorta.  7. Moderate hiatal hernia. Findings called to and discussed with Dr. Ashok Cordia Aortic Atherosclerosis (ICD10-I70.0). Electronically Signed   By: Dorise Bullion III M.D   On: 06/13/2017 13:30   Mr Brain W Wo Contrast  Result Date: 06/06/2017 CLINICAL DATA:  Non-small cell lung cancer staging EXAM: MRI HEAD WITHOUT AND WITH CONTRAST TECHNIQUE: Multiplanar, multiecho pulse sequences of the brain and surrounding structures were obtained without and with intravenous contrast. CONTRAST:  49mL MULTIHANCE GADOBENATE DIMEGLUMINE 529 MG/ML IV SOLN COMPARISON:  Head CT 03/02/2010 FINDINGS: Brain: The midline structures are normal. There is no acute infarct or acute hemorrhage. No mass lesion, hydrocephalus, dural abnormality or extra-axial collection. The brain parenchymal signal is normal. No age-advanced or lobar predominant atrophy. No chronic microhemorrhage or superficial siderosis. Vascular: Major intracranial arterial and venous sinus flow voids are preserved. Skull and upper cervical spine: There are multiple T1 hyperintense foci within the calvarium, the largest in the right parietal bone. This also shows intrinsic T2 hyperintensity, likely a hemangioma. Sinuses/Orbits: Mild left sphenoid sinus mucosal thickening. No mastoid or middle ear effusion. Normal orbits. IMPRESSION: 1. No intracranial metastatic  disease.  Normal brain. 2. 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 Electronically Signed   By: Ulyses Jarred M.D.   On: 06/06/2017 01:26   Ct Abdomen Pelvis W Contrast  Result Date: 06/02/2017 CLINICAL DATA:  Left upper quadrant abdominal pain and distention. Recently diagnosed with metastatic adenocarcinoma of unknown primary. Evaluate for diverticulitis. EXAM: CT ABDOMEN AND PELVIS WITH CONTRAST TECHNIQUE: Multidetector CT imaging of the abdomen and pelvis was performed using the standard protocol following bolus administration of intravenous contrast.  CONTRAST:  163mL ISOVUE-300 IOPAMIDOL (ISOVUE-300) INJECTION 61% COMPARISON:  PET-CT 05/26/2017. Abdominopelvic CT 05/25/2017 and 03/25/2016. FINDINGS: Lower chest: Patchy ground-glass opacities at both lung bases are stable. There is no significant pleural effusion. Moderate-sized hiatal hernia again noted. Hepatobiliary: The liver is normal in density without focal abnormality. Stable mild biliary dilatation and pneumobilia status post cholecystectomy. Pancreas: Multiple calcifications are present within the pancreatic head. There is a pancreatic stent in place with a small amount of air in the main pancreatic duct. The pancreatic body and tail appear normal. No acute surrounding inflammation. Spleen: Scattered small granulomas.  Normal in size. Adrenals/Urinary Tract: Stable generalized enlargement of the left adrenal gland consistent with hyperplasia. The right adrenal gland appears normal. Tiny low-density left renal lesions remain too small to characterize, but stable. No hydronephrosis or urinary tract calculus. The bladder appears normal. Stomach/Bowel: As above, moderate-size hiatal hernia. The stomach and small bowel appear normal. The proximal colon is fluid-filled. There are diverticular changes of the sigmoid colon with a fluid-filled inferiorly projecting diverticulum on axial image 64. There may be minimal inflammation in the surrounding fat. No evidence of bowel obstruction. Vascular/Lymphatic: There are no enlarged abdominal or pelvic lymph nodes. Scattered small retroperitoneal lymph nodes are stable. There is diffuse aortic and branch vessel atherosclerosis. No acute vascular findings. Reproductive: The uterus and ovaries appear normal. No adnexal mass. Probable pelvic floor laxity. Other: There is an enhancing soft tissue nodule in the anterior abdominal wall near the umbilicus, measuring 16 mm on image 41. Mildly hypermetabolic on recent PET-CT, probably a metastasis. No ascites, free air or  peritoneal nodularity. Musculoskeletal: Multifocal osseous metastatic disease, similar to recent studies. No pathologic fracture identified. IMPRESSION: 1. Possible mild sigmoid diverticulitis. No evidence of perforation, abscess or obstruction. 2. No other evidence of acute process. 3. Known metastatic disease to the bones. Probable enhancing metastasis within the anterior abdominal wall near the umbilicus. 4. Stable incidental findings including a moderate size hiatal hernia, mild biliary dilatation status post cholecystectomy, sequela of chronic calcific pancreatitis, left adrenal hyperplasia and Aortic Atherosclerosis (ICD10-I70.0). Electronically Signed   By: Richardean Sale M.D.   On: 06/02/2017 15:48   Ir US Guide Vasc Access Right  Result Date: 06/09/2017 CLINICAL DATA:  Lung cancer EXAM: TUNNEL POWER PORT PLACEMENT WITH SUBCUTANEOUS POCKET UTILIZING ULTRASOUND & FLOUROSCOPY FLUOROSCOPY TIME:  30 seconds.  Five mGy. MEDICATIONS AND MEDICAL HISTORY: Versed 4 mg, Fentanyl 100 mcg. Additional Medications: Ancef 2 g. Antibiotics were given within 2 hours of the procedure. ANESTHESIA/SEDATION: Moderate sedation time: 26 minutes. Nursing monitored the the patient during the procedure. PROCEDURE: After written informed consent was obtained, patient was placed in the supine position on angiographic table. The right neck and chest was prepped and draped in a sterile fashion. Lidocaine was utilized for local anesthesia. The right jugular vein was noted to be patent initially with ultrasound. Under sonographic guidance, a micropuncture needle was inserted into the right IJ vein (Ultrasound and  fluoroscopic image documentation was performed). The needle was removed over an 018 wire which was exchanged for a Amplatz. This was advanced into the IVC. An 8-French dilator was advanced over the Amplatz. A small incision was made in the right upper chest over the anterior right second rib. Utilizing blunt dissection, a  subcutaneous pocket was created in the caudal direction. The pocket was irrigated with a copious amount of sterile normal saline. The port catheter was tunneled from the chest incision, and out the neck incision. The reservoir was inserted into the subcutaneous pocket and secured with two 3-0 Ethilon stitches. A peel-away sheath was advanced over the Amplatz wire. The port catheter was cut to measure length and inserted through the peel-away sheath. The peel-away sheath was removed. The chest incision was closed with 3-0 Vicryl interrupted stitches for the subcutaneous tissue and a running of 4-0 Vicryl subcuticular stitch for the skin. The neck incision was closed with a 4-0 Vicryl subcuticular stitch. Derma-bond was applied to both surgical incisions. The port reservoir was flushed and instilled with heparinized saline. No complications. FINDINGS: A right IJ vein Port-A-Cath is in place with its tip at the cavoatrial junction. COMPLICATIONS: None IMPRESSION: Successful 8 French right internal jugular vein power port placement with its tip at the SVC/RA junction. Electronically Signed   By: Marybelle Killings M.D.   On: 06/09/2017 16:41   Dg Chest Port 1 View  Result Date: 06/30/2017 CLINICAL DATA:  Increasing shortness of breath, history of lung carcinoma EXAM: PORTABLE CHEST 1 VIEW COMPARISON:  06/28/2017 FINDINGS: Right-sided chest wall port is again noted and stable. Cardiac shadow is stable. Diffuse increased interstitial changes are identified increased from the prior exam consistent with pulmonary edema. No focal confluent infiltrate is seen. No sizable effusion is noted. No bony abnormality is seen. IMPRESSION: Increasing interstitial changes consistent with edema. Electronically Signed   By: Inez Catalina M.D.   On: 06/30/2017 08:13   Dg Chest Port 1 View  Result Date: 06/28/2017 CLINICAL DATA:  Septic shock.  History of lung carcinoma EXAM: PORTABLE CHEST 1 VIEW COMPARISON:  June 27, 2017 chest  radiograph and chest CT June 13, 2017 FINDINGS: There is diffuse interstitial prominence consistent with either pulmonary edema or lymphangitic spread of tumor. There is atelectatic change in the left base with questionable airspace consolidation immediately adjacent to a hiatal hernia. Heart is upper normal in size. There is mild pulmonary venous hypertension. There is aortic atherosclerosis. There is no adenopathy apparent by radiography. Central catheter tip is in the superior vena cava near the cavoatrial junction. No pneumothorax. Bony metastases are better appreciated on recent CT than on this study, although several ribs show evidence suggesting bony metastatic disease. Moderate hiatal hernia noted. There is aortic atherosclerosis. IMPRESSION: Interstitial prominence. Question a degree of pulmonary edema versus lymphangitic spread of tumor. Both entities may exist concurrently. Note that there is pulmonary venous hypertension. Question atelectasis versus early pneumonia left base. Moderate hiatal hernia. Stable cardiac silhouette. Bony metastatic disease, better seen on recent CT. There is aortic atherosclerosis. Aortic Atherosclerosis (ICD10-I70.0). Electronically Signed   By: Lowella Grip III M.D.   On: 06/28/2017 07:55   Mm Screening Breast Tomo Bilateral  Result Date: 06/10/2017 CLINICAL DATA:  Screening. EXAM: DIGITAL SCREENING BILATERAL MAMMOGRAM WITH TOMO AND CAD COMPARISON:  Previous exam(s). ACR Breast Density Category c: The breast tissue is heterogeneously dense, which may obscure small masses. FINDINGS: There are no findings suspicious for malignancy. Images were processed with CAD. IMPRESSION:  No mammographic evidence of malignancy. A result letter of this screening mammogram will be mailed directly to the patient. RECOMMENDATION: Screening mammogram in one year. (Code:[id number]) BI-RADS CATEGORY  1: Negative. Electronically Signed   By: Lajean Manes M.D.   On: 06/10/2017 09:40    Ir Fluoro Guide Port Insertion Right  Result Date: 06/09/2017 CLINICAL DATA:  Lung cancer EXAM: TUNNEL POWER PORT PLACEMENT WITH SUBCUTANEOUS POCKET UTILIZING ULTRASOUND & FLOUROSCOPY FLUOROSCOPY TIME:  30 seconds.  Five mGy. MEDICATIONS AND MEDICAL HISTORY: Versed 4 mg, Fentanyl 100 mcg. Additional Medications: Ancef 2 g. Antibiotics were given within 2 hours of the procedure. ANESTHESIA/SEDATION: Moderate sedation time: 26 minutes. Nursing monitored the the patient during the procedure. PROCEDURE: After written informed consent was obtained, patient was placed in the supine position on angiographic table. The right neck and chest was prepped and draped in a sterile fashion. Lidocaine was utilized for local anesthesia. The right jugular vein was noted to be patent initially with ultrasound. Under sonographic guidance, a micropuncture needle was inserted into the right IJ vein (Ultrasound and fluoroscopic image documentation was performed). The needle was removed over an 018 wire which was exchanged for a Amplatz. This was advanced into the IVC. An 8-French dilator was advanced over the Amplatz. A small incision was made in the right upper chest over the anterior right second rib. Utilizing blunt dissection, a subcutaneous pocket was created in the caudal direction. The pocket was irrigated with a copious amount of sterile normal saline. The port catheter was tunneled from the chest incision, and out the neck incision. The reservoir was inserted into the subcutaneous pocket and secured with two 3-0 Ethilon stitches. A peel-away sheath was advanced over the Amplatz wire. The port catheter was cut to measure length and inserted through the peel-away sheath. The peel-away sheath was removed. The chest incision was closed with 3-0 Vicryl interrupted stitches for the subcutaneous tissue and a running of 4-0 Vicryl subcuticular stitch for the skin. The neck incision was closed with a 4-0 Vicryl subcuticular stitch.  Derma-bond was applied to both surgical incisions. The port reservoir was flushed and instilled with heparinized saline. No complications. FINDINGS: A right IJ vein Port-A-Cath is in place with its tip at the cavoatrial junction. COMPLICATIONS: None IMPRESSION: Successful 8 French right internal jugular vein power port placement with its tip at the SVC/RA junction. Electronically Signed   By: Marybelle Killings M.D.   On: 06/09/2017 16:41     Caroline More, DO 06/30/2017, 5:18 PM PGY-1, [place] Intern pager: [phone], text pages welcome

## 2017-06-30 ENCOUNTER — Inpatient Hospital Stay (HOSPITAL_COMMUNITY): Payer: BLUE CROSS/BLUE SHIELD

## 2017-06-30 DIAGNOSIS — N319 Neuromuscular dysfunction of bladder, unspecified: Secondary | ICD-10-CM

## 2017-06-30 DIAGNOSIS — Z9889 Other specified postprocedural states: Secondary | ICD-10-CM

## 2017-06-30 DIAGNOSIS — E876 Hypokalemia: Secondary | ICD-10-CM

## 2017-06-30 DIAGNOSIS — R0602 Shortness of breath: Secondary | ICD-10-CM

## 2017-06-30 DIAGNOSIS — B965 Pseudomonas (aeruginosa) (mallei) (pseudomallei) as the cause of diseases classified elsewhere: Secondary | ICD-10-CM

## 2017-06-30 DIAGNOSIS — C801 Malignant (primary) neoplasm, unspecified: Secondary | ICD-10-CM

## 2017-06-30 DIAGNOSIS — R6521 Severe sepsis with septic shock: Secondary | ICD-10-CM

## 2017-06-30 DIAGNOSIS — J810 Acute pulmonary edema: Secondary | ICD-10-CM

## 2017-06-30 DIAGNOSIS — A419 Sepsis, unspecified organism: Secondary | ICD-10-CM

## 2017-06-30 DIAGNOSIS — E46 Unspecified protein-calorie malnutrition: Secondary | ICD-10-CM

## 2017-06-30 DIAGNOSIS — J9601 Acute respiratory failure with hypoxia: Secondary | ICD-10-CM

## 2017-06-30 DIAGNOSIS — J9600 Acute respiratory failure, unspecified whether with hypoxia or hypercapnia: Secondary | ICD-10-CM

## 2017-06-30 LAB — BLOOD GAS, ARTERIAL
Acid-Base Excess: 1.2 mmol/L (ref 0.0–2.0)
Bicarbonate: 24.5 mmol/L (ref 20.0–28.0)
DRAWN BY: 33100
FIO2: 40
O2 SAT: 93.4 %
PATIENT TEMPERATURE: 98.3
pCO2 arterial: 33 mmHg (ref 32.0–48.0)
pH, Arterial: 7.482 — ABNORMAL HIGH (ref 7.350–7.450)
pO2, Arterial: 59.6 mmHg — ABNORMAL LOW (ref 83.0–108.0)

## 2017-06-30 LAB — BPAM RBC
BLOOD PRODUCT EXPIRATION DATE: 201903192359
BLOOD PRODUCT EXPIRATION DATE: 201903252359
ISSUE DATE / TIME: 201903120636
ISSUE DATE / TIME: 201903121148
UNIT TYPE AND RH: 6200
Unit Type and Rh: 6200

## 2017-06-30 LAB — TYPE AND SCREEN
ABO/RH(D): A POS
Antibody Screen: NEGATIVE
UNIT DIVISION: 0
UNIT DIVISION: 0

## 2017-06-30 LAB — BASIC METABOLIC PANEL
Anion gap: 12 (ref 5–15)
Anion gap: 14 (ref 5–15)
BUN: 9 mg/dL (ref 6–20)
BUN: 8 mg/dL (ref 6–20)
CHLORIDE: 96 mmol/L — AB (ref 101–111)
CHLORIDE: 86 mmol/L — AB (ref 101–111)
CO2: 22 mmol/L (ref 22–32)
CO2: 25 mmol/L (ref 22–32)
Calcium: 5.7 mg/dL — CL (ref 8.9–10.3)
Calcium: 5.3 mg/dL — CL (ref 8.9–10.3)
Creatinine, Ser: 0.71 mg/dL (ref 0.44–1.00)
Creatinine, Ser: 0.73 mg/dL (ref 0.44–1.00)
GFR calc Af Amer: >60 mL/min (ref >60)
GFR calc Af Amer: >60 mL/min (ref >60)
GFR calc non Af Amer: >60 mL/min (ref >60)
GFR calc non Af Amer: >60 mL/min (ref >60)
GLUCOSE: 298 mg/dL — AB (ref 65–99)
GLUCOSE: 353 mg/dL — AB (ref 65–99)
POTASSIUM: 3.1 mmol/L — AB (ref 3.5–5.1)
POTASSIUM: 2.1 mmol/L — AB (ref 3.5–5.1)
Sodium: 130 mmol/L — ABNORMAL LOW (ref 135–145)
Sodium: 125 mmol/L — ABNORMAL LOW (ref 135–145)

## 2017-06-30 LAB — COMPREHENSIVE METABOLIC PANEL
ALBUMIN: 1.4 g/dL — AB (ref 3.5–5.0)
ALK PHOS: 136 U/L — AB (ref 38–126)
ALT: 28 U/L (ref 14–54)
AST: 25 U/L (ref 15–41)
Anion gap: 9 (ref 5–15)
BILIRUBIN TOTAL: 0.4 mg/dL (ref 0.3–1.2)
BUN: 9 mg/dL (ref 6–20)
CO2: 21 mmol/L — ABNORMAL LOW (ref 22–32)
Calcium: 5.6 mg/dL — CL (ref 8.9–10.3)
Chloride: 100 mmol/L — ABNORMAL LOW (ref 101–111)
Creatinine, Ser: 0.61 mg/dL (ref 0.44–1.00)
GFR calc Af Amer: >60 mL/min (ref >60)
GFR calc non Af Amer: >60 mL/min (ref >60)
GLUCOSE: 267 mg/dL — AB (ref 65–99)
POTASSIUM: 2.4 mmol/L — AB (ref 3.5–5.1)
Sodium: 130 mmol/L — ABNORMAL LOW (ref 135–145)
TOTAL PROTEIN: 4.7 g/dL — AB (ref 6.5–8.1)

## 2017-06-30 LAB — GLUCOSE, CAPILLARY
GLUCOSE-CAPILLARY: 244 mg/dL — AB (ref 65–99)
GLUCOSE-CAPILLARY: 183 mg/dL — AB (ref 65–99)
Glucose-Capillary: 237 mg/dL — ABNORMAL HIGH (ref 65–99)
Glucose-Capillary: 338 mg/dL — ABNORMAL HIGH (ref 65–99)
Glucose-Capillary: 347 mg/dL — ABNORMAL HIGH (ref 65–99)

## 2017-06-30 LAB — CULTURE, BLOOD (ROUTINE X 2)
SPECIAL REQUESTS: ADEQUATE
Special Requests: ADEQUATE

## 2017-06-30 LAB — CBC
HEMATOCRIT: 27.9 % — AB (ref 36.0–46.0)
HEMOGLOBIN: 9.6 g/dL — AB (ref 12.0–15.0)
MCH: 28.3 pg (ref 26.0–34.0)
MCHC: 34.4 g/dL (ref 30.0–36.0)
MCV: 82.3 fL (ref 78.0–100.0)
Platelets: 14 10*3/uL — CL (ref 150–400)
RBC: 3.39 MIL/uL — ABNORMAL LOW (ref 3.87–5.11)
RDW: 16.5 % — AB (ref 11.5–15.5)
WBC: 4.1 10*3/uL (ref 4.0–10.5)

## 2017-06-30 LAB — BRAIN NATRIURETIC PEPTIDE: B Natriuretic Peptide: 433.6 pg/mL — ABNORMAL HIGH (ref 0.0–100.0)

## 2017-06-30 LAB — PHOSPHORUS
PHOSPHORUS: 1.4 mg/dL — AB (ref 2.5–4.6)
PHOSPHORUS: 1.2 mg/dL — AB (ref 2.5–4.6)

## 2017-06-30 LAB — DIC (DISSEMINATED INTRAVASCULAR COAGULATION) PANEL
D DIMER QUANT: 6.38 ug{FEU}/mL — AB (ref 0.00–0.50)
INR: 1.34
PLATELETS: 14 10*3/uL — AB (ref 150–400)

## 2017-06-30 LAB — DIC (DISSEMINATED INTRAVASCULAR COAGULATION)PANEL
Prothrombin Time: 16.4 seconds — ABNORMAL HIGH (ref 11.4–15.2)
Smear Review: NONE SEEN
aPTT: 28 seconds (ref 24–36)

## 2017-06-30 LAB — TROPONIN I
Troponin I: 0.03 ng/mL (ref <0.03)
Troponin I: 0.03 ng/mL (ref <0.03)
Troponin I: 0.03 ng/mL (ref <0.03)

## 2017-06-30 LAB — MAGNESIUM: MAGNESIUM: 1.8 mg/dL (ref 1.7–2.4)

## 2017-06-30 MED ORDER — SODIUM CHLORIDE 0.9 % IV SOLN: INTRAVENOUS | Status: DC

## 2017-06-30 MED ORDER — SODIUM CHLORIDE 0.9 % IV SOLN
2.0000 g | Freq: Once | INTRAVENOUS | Status: AC
  Administered 2017-06-30: 2 g via INTRAVENOUS
  Filled 2017-06-30: qty 20

## 2017-06-30 MED ORDER — POTASSIUM PHOSPHATES 15 MMOLE/5ML IV SOLN
40.0000 meq | Freq: Once | INTRAVENOUS | Status: DC
  Administered 2017-06-30: 40 meq via INTRAVENOUS
  Filled 2017-06-30: qty 9.09

## 2017-06-30 MED ORDER — POTASSIUM CHLORIDE 20 MEQ PO PACK
40.0000 meq | PACK | ORAL | Status: AC
  Administered 2017-06-30 – 2017-07-01 (×2): 40 meq via ORAL
  Filled 2017-06-30 (×2): qty 2

## 2017-06-30 MED ORDER — POTASSIUM CHLORIDE CRYS ER 20 MEQ PO TBCR
40.0000 meq | EXTENDED_RELEASE_TABLET | Freq: Once | ORAL | Status: AC
  Administered 2017-06-30: 40 meq via ORAL
  Filled 2017-06-30: qty 2

## 2017-06-30 MED ORDER — FUROSEMIDE 10 MG/ML IJ SOLN
40.0000 mg | Freq: Once | INTRAMUSCULAR | Status: AC
  Administered 2017-06-30: 40 mg via INTRAVENOUS
  Filled 2017-06-30: qty 4

## 2017-06-30 MED ORDER — POTASSIUM CHLORIDE 10 MEQ/50ML IV SOLN: 10.0000 meq | INTRAVENOUS | Status: DC

## 2017-06-30 MED ORDER — POTASSIUM CHLORIDE 10 MEQ/50ML IV SOLN
10.0000 meq | INTRAVENOUS | Status: DC
  Administered 2017-06-30 (×3): 10 meq via INTRAVENOUS
  Filled 2017-06-30: qty 50

## 2017-06-30 MED ORDER — ACETAMINOPHEN 10 MG/ML IV SOLN
1000.0000 mg | Freq: Four times a day (QID) | INTRAVENOUS | Status: AC
  Administered 2017-06-30 – 2017-07-01 (×4): 1000 mg via INTRAVENOUS
  Filled 2017-06-30 (×4): qty 100

## 2017-06-30 MED ORDER — POTASSIUM PHOSPHATES 15 MMOLE/5ML IV SOLN
30.0000 mmol | Freq: Once | INTRAVENOUS | Status: AC
  Administered 2017-06-30: 30 mmol via INTRAVENOUS
  Filled 2017-06-30 (×2): qty 10

## 2017-06-30 MED ORDER — POTASSIUM & SODIUM PHOSPHATES 280-160-250 MG PO PACK
1.0000 | PACK | ORAL | Status: AC
  Administered 2017-06-30 – 2017-07-01 (×2): 1 via ORAL
  Filled 2017-06-30 (×2): qty 1

## 2017-06-30 MED ORDER — MAGNESIUM SULFATE 4 GM/100ML IV SOLN
4.0000 g | Freq: Once | INTRAVENOUS | Status: AC
  Administered 2017-06-30: 4 g via INTRAVENOUS
  Filled 2017-06-30: qty 100

## 2017-06-30 MED ORDER — POTASSIUM CHLORIDE IN NACL 40-0.9 MEQ/L-% IV SOLN
INTRAVENOUS | Status: DC
  Filled 2017-06-30: qty 1000

## 2017-06-30 MED ORDER — FUROSEMIDE 10 MG/ML IJ SOLN
40.0000 mg | Freq: Four times a day (QID) | INTRAMUSCULAR | Status: DC
  Administered 2017-06-30: 40 mg via INTRAVENOUS
  Filled 2017-06-30: qty 4

## 2017-06-30 NOTE — Progress Notes (Signed)
FPTS Interim Progress Note  Spoke to patient this morning. Patient made it very clear she would not like to be intubated. I also spoke to [place], [place], and he agreed that he does not believe intubation is in patient's wishes.   Caroline More, DO 06/30/2017, 8:34 AM PGY-1, [place] [facility] Service pager [phone]

## 2017-06-30 NOTE — Progress Notes (Addendum)
FPTS Interim Progress Note  S: RN called to report patient more short of breath. O2 requirement of 6 L [place], previously on 4L. Lungs sounded more "wet". Went to bedside to evaluate further. Patient is dyspneic but coherent. Able to answer questions. States this morning when she woke up she felt more short of breath. Admits to some chest pain but states no more than usual.   O: BP 137/66   Pulse 100   Temp 97.9 F (36.6 C) (Oral)   Resp 16   Ht 5\' 6"  (1.676 m)   Wt 163 lb 5.8 oz (74.1 kg)   LMP 12/08/2006   SpO2 92%   BMI 26.37 kg/m   General: alert but tachypneic Cardiovascular: Tachycardic rate, 2+ DP pulses bilaterally Respiratory: Increased work of breathing, increased respiratory rate, rhonchorous lung sounds bilaterally throughout, on nasal cannula Extremities: 2+ pitting edema in ankles and feet Neuro: A&O   A/P: Acute on Chronic Respiratory Failure with tachycardia: On 6L [place] with O2 in high 80's/low 90's (was previously on 4 L). Tachycardic to 110-120.  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  -Obtain stat chest x-ray -Lasix 40 mg IV  -DuoNeb treatment -Stop all fluids -Will continue to follow very closely -Will call CCM if warranted   Addendum: CXR has not been read yet but looks like patchy infiltrates throughout both lungs. Looks worse than 3/10 CXR. Patient on Venturi mask now. Will call CCM  Carlyle Dolly, MD 06/30/2017, 6:32 AM PGY-3, [place] [facility] Service pager [phone]

## 2017-06-30 NOTE — Progress Notes (Signed)
Visited with this patient and friend who are inquiring about [place] which we do not assist with.  As hospital notaries, we are not allowed to notarize financial paperwork.  I advised them to seek the assistance of an attorney.  Patient states they already had HCPOA in place.      06/30/17 1045  Clinical Encounter Type  Visited With Patient (Best friend at bedside)

## 2017-06-30 NOTE — Progress Notes (Signed)
FPTS Interim Note:   Stopped by to check on patient, shortly after she was seen by Dr. Nelda Marseille. CCM consult appreciated. Again discussed goals of care with boyfriend (patient intermittently falling asleep). Explained to him that she is not doing well from a respiratory standpoint today and may be in the hours to days timeframe of life expectancy if she is not able to overcome the acute pulmonary edema. We are going to continue aggressive diuresis and electrolyte replacement. [name] was not sure what palliative medicine means (has been seen last on 3/10), I explained their role in our team as well as what comfort care might mean if they desired to transition to this. [name] had previously thought that she would improve from bacteremia and PE, but now understands that her many medical problems are limiting her prognosis. Additionally, I recommended that [name] call family members or friends who may want to visit the patient. He would like to speak to a chaplain, asked RN to page. Called palliative care team and asked that they round on the patient today, Threasa Beards will relay to assigned team member. Encouraged [name] to have RN call us with any further questions or concerns. Will PM round as well.   Ralene Ok, MD

## 2017-06-30 NOTE — Progress Notes (Signed)
[name] for Infectious Disease   Reason for visit: Follow up on Pseudomonas bacteremia  Interval History: remains afebrile, WBC 4.1, no associated rash; some loose stools.  Eating this am.  Cefepime day 3 Total antibiotics day 4  Physical Exam: Constitutional:  Vitals:   06/30/17 0700 06/30/17 0800  BP: (!) 142/66 132/67  Pulse: (!) 110 (!) 111  Resp: 16 16  Temp:  98.3 F (36.8 C)  SpO2:     patient appears in NAD Eyes: anicteric HENT: no thrush Respiratory: Normal respiratory effort; CTA B Cardiovascular: RRR GI: soft, nt, nd  Review of Systems: Constitutional: negative for fevers and chills Gastrointestinal: negative for nausea and diarrhea Integument/breast: negative for rash  Lab Results  Component Value Date   WBC 4.1 06/30/2017   HGB 9.6 (L) 06/30/2017   HCT 27.9 (L) 06/30/2017   MCV 82.3 06/30/2017   PLT 14 (LL) 06/30/2017    Lab Results  Component Value Date   CREATININE 0.61 06/30/2017   BUN 9 06/30/2017   NA 130 (L) 06/30/2017   K 2.4 (LL) 06/30/2017   CL 100 (L) 06/30/2017   CO2 21 (L) 06/30/2017    Lab Results  Component Value Date   ALT 28 06/30/2017   AST 25 06/30/2017   ALKPHOS 136 (H) 06/30/2017     Microbiology: Recent Results (from the past 240 hour(s))  Culture, blood (Routine X 2) w Reflex to ID Panel     Status: Abnormal   Collection Time: 06/27/17  6:07 AM  Result Value Ref Range Status   Specimen Description BLOOD LEFT HAND  Final   Special Requests IN PEDIATRIC BOTTLE Blood Culture adequate volume  Final   Culture  Setup Time   Final    GRAM NEGATIVE RODS IN PEDIATRIC BOTTLE CRITICAL RESULT CALLED TO, READ BACK BY AND VERIFIED WITH: A.MASTERS,PHARMD AT 9892 ON 06/28/17 BY G.MCADOO Performed at [facility], [place] [address], [place], [place] [address]    Culture PSEUDOMONAS AERUGINOSA (A)  Final   Report Status 06/30/2017 FINAL  Final   Organism ID, Bacteria PSEUDOMONAS AERUGINOSA  Final      Susceptibility    Pseudomonas aeruginosa - MIC*    CEFTAZIDIME 4 SENSITIVE Sensitive     CIPROFLOXACIN <=0.25 SENSITIVE Sensitive     GENTAMICIN <=1 SENSITIVE Sensitive     IMIPENEM 1 SENSITIVE Sensitive     PIP/TAZO 8 SENSITIVE Sensitive     CEFEPIME <=1 SENSITIVE Sensitive     * PSEUDOMONAS AERUGINOSA  Blood Culture ID Panel (Reflexed)     Status: Abnormal   Collection Time: 06/27/17  6:07 AM  Result Value Ref Range Status   Enterococcus species NOT DETECTED NOT DETECTED Final   Listeria monocytogenes NOT DETECTED NOT DETECTED Final   Staphylococcus species NOT DETECTED NOT DETECTED Final   Staphylococcus aureus NOT DETECTED NOT DETECTED Final   Streptococcus species NOT DETECTED NOT DETECTED Final   Streptococcus agalactiae NOT DETECTED NOT DETECTED Final   Streptococcus pneumoniae NOT DETECTED NOT DETECTED Final   Streptococcus pyogenes NOT DETECTED NOT DETECTED Final   Acinetobacter baumannii NOT DETECTED NOT DETECTED Final   Enterobacteriaceae species NOT DETECTED NOT DETECTED Final   Enterobacter cloacae complex NOT DETECTED NOT DETECTED Final   Escherichia coli NOT DETECTED NOT DETECTED Final   Klebsiella oxytoca NOT DETECTED NOT DETECTED Final   Klebsiella pneumoniae NOT DETECTED NOT DETECTED Final   Proteus species NOT DETECTED NOT DETECTED Final   Serratia marcescens NOT DETECTED  NOT DETECTED Final   Carbapenem resistance NOT DETECTED NOT DETECTED Final   Haemophilus influenzae NOT DETECTED NOT DETECTED Final   Neisseria meningitidis NOT DETECTED NOT DETECTED Final   Pseudomonas aeruginosa DETECTED (A) NOT DETECTED Final    Comment: CRITICAL RESULT CALLED TO, READ BACK BY AND VERIFIED WITH: A.MASTERS,PHARMD AT 8416 ON 06/28/17 BY G.MCADOO    Candida albicans NOT DETECTED NOT DETECTED Final   Candida glabrata NOT DETECTED NOT DETECTED Final   Candida krusei NOT DETECTED NOT DETECTED Final   Candida parapsilosis NOT DETECTED NOT DETECTED Final   Candida tropicalis NOT DETECTED NOT  DETECTED Final    Comment: Performed at [facility], [place] [address] [place], [place] [address]  Culture, blood (Routine X 2) w Reflex to ID Panel     Status: Abnormal   Collection Time: 06/27/17  6:12 AM  Result Value Ref Range Status   Specimen Description BLOOD LEFT HAND  Final   Special Requests IN PEDIATRIC BOTTLE Blood Culture adequate volume  Final   Culture  Setup Time   Final    GRAM NEGATIVE RODS IN PEDIATRIC BOTTLE CRITICAL VALUE NOTED.  VALUE IS CONSISTENT WITH PREVIOUSLY REPORTED AND CALLED VALUE.    Culture (A)  Final    PSEUDOMONAS AERUGINOSA SUSCEPTIBILITIES PERFORMED ON PREVIOUS CULTURE WITHIN THE LAST 5 DAYS. Performed at [facility], [place] [address] [place], [name] [address]    Report Status 06/30/2017 FINAL  Final  MRSA PCR Screening     Status: None   Collection Time: 06/27/17  5:40 PM  Result Value Ref Range Status   MRSA by PCR NEGATIVE NEGATIVE Final    Comment:        The GeneXpert MRSA Assay (FDA approved for NASAL specimens only), is one component of a comprehensive MRSA colonization surveillance program. It is not intended to diagnose MRSA infection nor to guide or monitor treatment for MRSA infections. Performed at [facility], [place] [address], [place], [place] [address]   Culture, Urine     Status: None   Collection Time: 06/27/17 10:58 PM  Result Value Ref Range Status   Specimen Description URINE, RANDOM  Final   Special Requests NONE  Final   Culture   Final    NO GROWTH Performed at [facility], [place] [address], [place], [place] [address]    Report Status 06/29/2017 FINAL  Final  C difficile quick scan w PCR reflex     Status: None   Collection Time: 06/29/17  9:20 PM  Result Value Ref Range Status   C Diff antigen NEGATIVE NEGATIVE Final   C Diff toxin NEGATIVE NEGATIVE Final   C Diff interpretation No C. difficile detected.  Final    Comment: Performed at [facility], [place] [address],  [place], [place] [address]    Impression/Plan:  1. Pseudomonas bacteremia - on appropriate therapy.  Responding to therapy.  There is no indication to cover with gentamicin as well.  Sensitivities noted.  Continue with cefepime for 10 days through March 19. Likely can use port at discharge.    2.  Loose stools - getting Ensure, recent miralax.  Not c/w C diff.  C diff test also negative.   3.  Thrombocytopenia - persistent and worsened with acute illness.  Getting a transufusion per Dr. Marin Olp.

## 2017-06-30 NOTE — Progress Notes (Signed)
FPTS Interim Progress Note  S:Came by to see patient this afternoon. Patient is in better spirits. States she feels better and her breathing is improved. [name] is at bedside and both have discussed settling affairs. Patient is hopeful to go home soon as she misses her "kitties". Reports severe pain, states she hasn't had pain medication today. Patient and [name] report patient is much more awake now. [name] states she had increased appetite this afternoon and ate an entire sandwich and mocha shake. Patient was smiling throughout encounter.   O: BP (!) 148/63   Pulse (!) 101   Temp 98.8 F (37.1 C) (Axillary)   Resp (!) 21   Ht 5\' 6"  (1.676 m)   Wt 163 lb 5.8 oz (74.1 kg)   LMP 12/08/2006   SpO2 92%   BMI 26.37 kg/m   Gen: awake and alert, sitting up in bed, venturi mask in place  Cardiac: RRR, no MRG Resp: diffuse crackles throughout, able to speak full sentences Ext: 3+ edema in lower extremities bilaterally to knee, no edema in upper extremities   A/P: Acute on chronic hypoxemic respiratory failure  Slight improvement from this am in terms of mentation and edema. Still course crackles throughout  -Give 2 doses lasix 40mg  IV q6 to complete 3 dose course recommended by CCM  -continue supplemental O2 prn   Hypophosphatemia/Hypokalemia Repeat BMP showing K 3.1 and phosphorus 1.4.  -continue Kphos -recheck phos @8pm   Caroline More, DO 06/30/2017, 4:41 PM PGY-1, [place] Medicine Service pager [phone]

## 2017-06-30 NOTE — Consult Note (Signed)
[facility] / Critical Care Progress Note   PATIENT INFO: [name]  62 y.o. female 11-Mar-1956  BRIEF:  62 y/o female, recently diagnosed with metastatic adeno ca, s/p chemoRx 1 wk ago, followed by Dr. Marin Olp.  She was admitted 06/27/17 with sepsis and epistaxis.  She is on Eliquis for RLL PE and bilateral LE DVT. Empirically started on Vanco + Zosyn. Followed by Oncology and Palliative Care.  Was initially full code but after discussions on admission, changed to DNR. Boyfriend-Steve- has POA- at bedside.  SUBJECTIVE:   Feels better, per RN O2 demand increased from 4L to 50%  VITALS: BP 132/67   Pulse (!) 111   Temp 98.3 F (36.8 C) (Axillary)   Resp 16   Ht 5\' 6"  (1.676 m)   Wt 163 lb 5.8 oz (74.1 kg)   LMP 12/08/2006   SpO2 92%   BMI 26.37 kg/m    PHYSICAL EXAM: General: Chronically ill appearing female, resting in bed, in NAD. Frail and lethargic Neuro: A&O x3 and moving all ext to command HEENT: [place]/AT, PERRL, EOM-I and MMM Cardiovascular: RRR, Nl S1/S2 and -M/R/G. Lungs: Diffuse crackles Abdomen: Soft, NT, ND and +BS Musculoskeletal: No gross deformities, no edema.  Skin: Intact, warm, no rashes.  CMP Latest Ref Rng & Units 06/30/2017 06/29/2017 06/29/2017  Glucose 65 - 99 mg/dL 267(H) 283(H) 184(H)  BUN 6 - 20 mg/dL 9 10 11   Creatinine 0.44 - 1.00 mg/dL 0.61 0.69 0.65  Sodium 135 - 145 mmol/L 130(L) 132(L) 131(L)  Potassium 3.5 - 5.1 mmol/L 2.4(LL) 2.8(L) 2.7(LL)  Chloride 101 - 111 mmol/L 100(L) 101 103  CO2 22 - 32 mmol/L 21(L) 21(L) 21(L)  Calcium 8.9 - 10.3 mg/dL 5.6(LL) 5.6(LL) 5.8(LL)  Total Protein 6.5 - 8.1 g/dL 4.7(L) - 4.9(L)  Total Bilirubin 0.3 - 1.2 mg/dL 0.4 - 0.5  Alkaline Phos 38 - 126 U/L 136(H) - 136(H)  AST 15 - 41 U/L 25 - 30  ALT 14 - 54 U/L 28 - 32   CBC Latest Ref Rng & Units 06/30/2017 06/30/2017 06/29/2017  WBC 4.0 - 10.5 K/uL - 4.1 5.5  Hemoglobin 12.0 - 15.0 g/dL - 9.6(L) 11.2(L)  Hematocrit 36.0 - 46.0 % - 27.9(L)  32.1(L)  Platelets 150 - 400 K/uL 14(LL) 14(LL) 22(LL)    CXR 3/11 > interstitial prominence, pulm edema vs lymphagetic spread of tumor. CT head 3/10 > sinusitus. PET scan 06/05/17 > widespread skeletal mets within spine / pelvis, nodular lesion in RUL with mild metabolic activity.   ASSESSMENT / PLAN: 62 y.o. F with PMH recently diagnosed metastatic adenocarcinoma, extensive skeletal mets, s/p 1 cycle chemotherapy, admitted with sepsis.  Blood cultures growing GNRs (pseudomonas).  PCCM called back due to increase O2 demand from 4L [place] to 50%.  Spoke with patient and DPOA who is bedside, patient is a full DNR.  Discussed with resident.   Acute on chronic hypoxemic respiratory failure: due to pulmonary edema, patient has been on 100 ml/hr NS and +6L.  - KVO IVF  - Titrate O2 for sat of 88-92%  - Lasix 40 mg IV q6 x3 doses   Hypokalemia:  - KCl 40 meq IV  - Recheck  Bacteremia with septic shock - Blood cultures growing GNRs (pseudomonas) and GPRs (speciation pending). Was started on levophed but has been at very low dosing, being turned off this morning.  - Cefepime  - F/U WBC and fever curve  Hx PE with DVT (on eliquis).  - Continue eliquis  and monitor for signs of any bleeding given thrombocytopenia.  Hypomag: without adequate mag will not replace K adequately  - Mg2SO4 4 gm IV ordered  Severely hypophos  - K3PO4 30 mmol IV  Mild anemia. Thrombocytopenia - currently getting platelet transfusion.  - Transfuse for Hgb < 7.  - Monitor platelets.  - Transfusions per oncology.  Severe protein calorie malnutrition.  - Nutrition consult placed.  Metastatic adenocarcinoma - followed by Dr. Marin Olp and just recently completed first chemo session.  - F/u with Dr. Marin Olp as outpatient.  GOC:  - Spoke with patient and DPOA bedside, they still believe and I agree that DNR status would be most appropriate  - Would recommend a stronger involvement of palliative care given lung cancer  and overall health  PCCM will sign off, please call back if needed.  The patient is critically ill with multiple organ systems failure and requires high complexity decision making for assessment and support, frequent evaluation and titration of therapies, application of advanced monitoring technologies and extensive interpretation of multiple databases.   Critical Care Time devoted to patient care services described in this note is  35  Minutes. This time reflects time of care of this signee Dr Jennet Maduro. This critical care time does not reflect procedure time, or teaching time or supervisory time of PA/NP/Med student/Med Resident etc but could involve care discussion time.  Rush Farmer, M.D. [facility] Pulmonary/Critical Care Medicine. Pager: [phone]. After hours pager: [phone].

## 2017-06-30 NOTE — Progress Notes (Addendum)
Palliative Care Progress Note  Checked on Etheline and [name] this evening after reviewing notes from earlier today. [place] is struggling this evening. She has a very high fever, she is becoming confused and delirious. Her breathing is labored and she is diaphoretic. She appears generally very uncomfortable-multiple monitoring cables, O2 mask hanging around her neck and also a nasal cannula. She does not want to be left alone. [name] is at her bedside-he is exhausted but afraid to go home tonight. She also says her pain is "severe". She cannot tolerate much mobility or she has pain and is easily fatigued.  I explained to Lalita and [name] that I was checking on her and that my role was to support oncology and the [facility] team in making sure she had good pain control and symptom management as well as offering my assistance in making sure their questions were answered and that we were providing care that was in line with her wishes given the severity of her illness. I am just meeting them for the first time this evening but I am very concerned about her trajectory. I spoke with Remo Lipps outside of the room due to her confusion. [name] understands how fragile and serious her condition is right now.  I felt is was appropriate to at least discuss the option of stopping our current interventions and possibly having a short window to get her back home to her "kitties" where she can have a death that is less medicalized and surrounded by the comforts of home. [name] and other family could also rest and be more comfortable too. I introduced the concept of hospice and shifting towards a focus on her comfort and making the best of the time she has left-especially given the very high mortality associated with psuedomonas bacteremia and lung cancer. I did not of course pressure them towards any option but I just wanted to make sure they knew that there were options available other than a hospital decline and death- which sadly does  appear to be the direction she is headed. [name] was very grateful to know this option existed, but I was open about the fact that she may be too unstable to move or deteriorate quickly.There is a small chance that her delirium, confusion and distress are in the setting of her high fever and once we treat that she may feel better and be able to rest.  With lung cancer and now pseudomonas bacteremia I am not sure we are actually going to be able to turn this around for her. They both agree that a focus on not suffering is very important and they are considering medical options as they are presented trying to consider risks and benefits.  Recommendations:  1. Fever: IV Tylenol SCHEDULED q6 hours- swallowing pills was very difficult for her while I was in the room. 2. Pain: Her pain regimen is not optimal -she has two long acting opioids Duragesic and Oxycontin and some under dosed breakthrough oral hydromorphone.It appears that swallowing pills is getting harder for her- she may be developing oral thrush as well but no obvious exudates or plaques.  24 hour opoid calculation: Fentanyl 85mcg/hr transdermal- given Oxycontin 20mg  BID- 40mg  given Hydromorphone 2mg  q4 prn- 10mg  given  =250mg  oral morphine equivalents=150mcg fentanyl= will reduce by 25% for cross tolerance and increase her patch to 100 mcg.  Will d/c hydromorphone-probably not contributing much to pain control give other opioids- will use oxycodone 20mg  q4 prn and add on an IV hydromorphone order in  case she deteriorates and cannot swallow.  3. Dyspnea, Severe: Recommend being aggressive with her diuresis-she is very "wet" sounding but put out 3 Liters during day shift and has 700 out now. She is still having significant dyspnea. Her labs are very abnormal- hyponatremia, hypokalemia, hypocalcemia, Can give her IV K runs to replete until they decide about comfort care transition or she declares her self in terms of improvement or decline-  she had a very hard time with the oral KCl.She is drinking lots of water- hopefully if we raise her K the Na will follow. She is frequently desaturating and causing her monitor to alarm - aggressive diuresis could be a comfort measure as well.   RN asked me about holding transfer to step-down - should wait until we get her more comfortable and stable- they may elect for comfort care at which point med-surg or home with hospice may be appropriate but right now she needs a high level of attention in ICU.  D/W FP resident and RN.  Lane Hacker, DO Palliative Medicine [phone]  Time: 50 minutes Greater than 50%  of this time was spent counseling and coordinating care related to the above assessment and plan.

## 2017-06-30 NOTE — Progress Notes (Signed)
PT Cancellation Note  Patient Details Name: [name] MRN: [id number] DOB: 10-17-1955   Cancelled Treatment:    Reason Eval/Treat Not Completed: Patient not medically ready. Pt currently on BiPAP. Will follow-up for PT treatment pending medical appropriateness.   [name], PT, DPT Acute Rehab Services  Pager: [phone] 06/30/2017, 10:28 AM

## 2017-06-30 NOTE — Progress Notes (Signed)
The blood cultures are growing Pseudomonas.  This is quite troubling.  It is no surprise that her blood counts, particular platelets are low.  Pseudomonas is incredibly adept at destroying platelets through a toxin mediated process.  She seems to be a little bit better.  Her blood counts are still quite low.  Her platelet count is only 14,000.  I will go ahead and give her a platelet transfusion.  She still has hypokalemia with a potassium of 2.4.  Her calcium is 5.6.  Her blood sugar still elevated at 267.  Thankfully, she is not neutropenic.  Her white cell count is 4.1.  Her hemoglobin is 9.6.  I am still not sure how much she really is eating.  I Try to encourage her to eat.  She is had no vomiting.  She does have some nausea.  There is been no obvious bleeding.  She is had no diarrhea.  There has been no rashes.  I am still awaiting the sensitivities on the Pseudomonas.  She is on Maxipime.  I think that she might need to be on a second antibiotic.  Again, we need to await the results of the sensitivities.  For now, I probably would consider an aminoglycoside.  Her renal function is okay.  Her boyfriend is with her.  Her blood sugars are still on the higher side.  She is on hydrocortisone.  May be, this can be tapered down.  We will continue to follow her along closely.  Lattie Haw, MD  Romans 8:28

## 2017-06-30 NOTE — Progress Notes (Signed)
INTERIM PROGRESS  Critical lab values called in hypokalemia/calcemia  Discussed most efficient replacement with Jeneen Rinks, PharmD given patients fluid status.   Will order mag and d/c lasix  Agreed with plan for Jeneen Rinks to order Kdurx2, potassium phos IV 87mEq in 551ml, calcium gluconate 2g, oral potassium phos pack  Patient's nurse notified  -Dr. Criss Rosales

## 2017-07-01 ENCOUNTER — Inpatient Hospital Stay (HOSPITAL_COMMUNITY): Payer: BLUE CROSS/BLUE SHIELD

## 2017-07-01 ENCOUNTER — Encounter (HOSPITAL_COMMUNITY): Payer: Self-pay | Admitting: Hematology & Oncology

## 2017-07-01 DIAGNOSIS — R000 Tachycardia, unspecified: Secondary | ICD-10-CM

## 2017-07-01 DIAGNOSIS — R05 Cough: Secondary | ICD-10-CM

## 2017-07-01 DIAGNOSIS — R918 Other nonspecific abnormal finding of lung field: Secondary | ICD-10-CM

## 2017-07-01 DIAGNOSIS — J811 Chronic pulmonary edema: Secondary | ICD-10-CM

## 2017-07-01 DIAGNOSIS — Z9981 Dependence on supplemental oxygen: Secondary | ICD-10-CM

## 2017-07-01 DIAGNOSIS — R0602 Shortness of breath: Secondary | ICD-10-CM

## 2017-07-01 LAB — CBC WITH DIFFERENTIAL/PLATELET
BAND NEUTROPHILS: 0 %
BLASTS: 0 %
Basophils Absolute: 0 10*3/uL (ref 0.0–0.1)
Basophils Relative: 0 %
EOS ABS: 0 10*3/uL (ref 0.0–0.7)
Eosinophils Relative: 0 %
HEMATOCRIT: 28.5 % — AB (ref 36.0–46.0)
Hemoglobin: 9.6 g/dL — ABNORMAL LOW (ref 12.0–15.0)
LYMPHS ABS: 0.2 10*3/uL — AB (ref 0.7–4.0)
Lymphocytes Relative: 4 %
MCH: 27.8 pg (ref 26.0–34.0)
MCHC: 33.7 g/dL (ref 30.0–36.0)
MCV: 82.6 fL (ref 78.0–100.0)
METAMYELOCYTES PCT: 0 %
MONOS PCT: 0 %
Monocytes Absolute: 0 10*3/uL — ABNORMAL LOW (ref 0.1–1.0)
Myelocytes: 0 %
NEUTROS ABS: 3.6 10*3/uL (ref 1.7–7.7)
Neutrophils Relative %: 96 %
Other: 0 %
PLATELETS: 45 10*3/uL — AB (ref 150–400)
Promyelocytes Absolute: 0 %
RBC: 3.45 MIL/uL — AB (ref 3.87–5.11)
RDW: 16.7 % — AB (ref 11.5–15.5)
WBC: 3.8 10*3/uL — AB (ref 4.0–10.5)
nRBC: 0 /100 WBC

## 2017-07-01 LAB — ECHOCARDIOGRAM COMPLETE
Height: 66 in
Weight: 2613.77 oz

## 2017-07-01 LAB — COMPREHENSIVE METABOLIC PANEL
ALBUMIN: 1.6 g/dL — AB (ref 3.5–5.0)
ALK PHOS: 159 U/L — AB (ref 38–126)
ALK PHOS: 160 U/L — AB (ref 38–126)
ALT: 30 U/L (ref 14–54)
ALT: 30 U/L (ref 14–54)
ANION GAP: 13 (ref 5–15)
AST: 30 U/L (ref 15–41)
AST: 33 U/L (ref 15–41)
Albumin: 1.9 g/dL — ABNORMAL LOW (ref 3.5–5.0)
Anion gap: 15 (ref 5–15)
BILIRUBIN TOTAL: 0.8 mg/dL (ref 0.3–1.2)
BILIRUBIN TOTAL: 0.6 mg/dL (ref 0.3–1.2)
BUN: 9 mg/dL (ref 6–20)
BUN: 13 mg/dL (ref 6–20)
CALCIUM: 5.6 mg/dL — AB (ref 8.9–10.3)
CALCIUM: 5.7 mg/dL — AB (ref 8.9–10.3)
CO2: 28 mmol/L (ref 22–32)
CO2: 30 mmol/L (ref 22–32)
CREATININE: 0.83 mg/dL (ref 0.44–1.00)
Chloride: 88 mmol/L — ABNORMAL LOW (ref 101–111)
Chloride: 85 mmol/L — ABNORMAL LOW (ref 101–111)
Creatinine, Ser: 0.71 mg/dL (ref 0.44–1.00)
GFR calc non Af Amer: >60 mL/min (ref >60)
GLUCOSE: 202 mg/dL — AB (ref 65–99)
Glucose, Bld: 307 mg/dL — ABNORMAL HIGH (ref 65–99)
POTASSIUM: 3.5 mmol/L (ref 3.5–5.1)
Potassium: 2.3 mmol/L — CL (ref 3.5–5.1)
Sodium: 129 mmol/L — ABNORMAL LOW (ref 135–145)
Sodium: 130 mmol/L — ABNORMAL LOW (ref 135–145)
TOTAL PROTEIN: 5.5 g/dL — AB (ref 6.5–8.1)
TOTAL PROTEIN: 6 g/dL — AB (ref 6.5–8.1)

## 2017-07-01 LAB — GLUCOSE, CAPILLARY
GLUCOSE-CAPILLARY: 228 mg/dL — AB (ref 65–99)
GLUCOSE-CAPILLARY: 224 mg/dL — AB (ref 65–99)
Glucose-Capillary: 259 mg/dL — ABNORMAL HIGH (ref 65–99)
Glucose-Capillary: 306 mg/dL — ABNORMAL HIGH (ref 65–99)

## 2017-07-01 LAB — CBC
HEMATOCRIT: 29.9 % — AB (ref 36.0–46.0)
Hemoglobin: 10.3 g/dL — ABNORMAL LOW (ref 12.0–15.0)
MCH: 28.1 pg (ref 26.0–34.0)
MCHC: 34.4 g/dL (ref 30.0–36.0)
MCV: 81.7 fL (ref 78.0–100.0)
Platelets: 21 10*3/uL — CL (ref 150–400)
RBC: 3.66 MIL/uL — ABNORMAL LOW (ref 3.87–5.11)
RDW: 16.7 % — ABNORMAL HIGH (ref 11.5–15.5)
WBC: 3.8 10*3/uL — ABNORMAL LOW (ref 4.0–10.5)

## 2017-07-01 LAB — URINALYSIS, ROUTINE W REFLEX MICROSCOPIC
BILIRUBIN URINE: NEGATIVE
GLUCOSE, UA: NEGATIVE mg/dL
KETONES UR: NEGATIVE mg/dL
LEUKOCYTES UA: NEGATIVE
NITRITE: NEGATIVE
PH: 7 (ref 5.0–8.0)
Protein, ur: 30 mg/dL — AB
SPECIFIC GRAVITY, URINE: 1.006 (ref 1.005–1.030)
Squamous Epithelial / LPF: NONE SEEN

## 2017-07-01 LAB — TYPE AND SCREEN
ABO/RH(D): A POS
ANTIBODY SCREEN: NEGATIVE

## 2017-07-01 LAB — PHOSPHORUS
PHOSPHORUS: 1.9 mg/dL — AB (ref 2.5–4.6)
PHOSPHORUS: 1.6 mg/dL — AB (ref 2.5–4.6)

## 2017-07-01 MED ORDER — CHLORHEXIDINE GLUCONATE CLOTH 2 % EX PADS
6.0000 | MEDICATED_PAD | Freq: Every day | CUTANEOUS | Status: DC
  Administered 2017-07-01: 6 via TOPICAL

## 2017-07-01 MED ORDER — FUROSEMIDE 10 MG/ML IJ SOLN: 40.0000 mg | Freq: Two times a day (BID) | INTRAMUSCULAR | Status: DC

## 2017-07-01 MED ORDER — OXYMETAZOLINE HCL 0.05 % NA SOLN
1.0000 | Freq: Two times a day (BID) | NASAL | Status: AC
  Administered 2017-07-01 – 2017-07-02 (×3): 1 via NASAL
  Filled 2017-07-01: qty 15

## 2017-07-01 MED ORDER — FUROSEMIDE 10 MG/ML IJ SOLN
40.0000 mg | Freq: Two times a day (BID) | INTRAMUSCULAR | Status: DC
  Administered 2017-07-01 – 2017-07-03 (×6): 40 mg via INTRAVENOUS
  Filled 2017-07-01 (×6): qty 4

## 2017-07-01 MED ORDER — SODIUM CHLORIDE 0.9 % IV SOLN
2.0000 g | Freq: Once | INTRAVENOUS | Status: AC
  Administered 2017-07-01: 2 g via INTRAVENOUS
  Filled 2017-07-01: qty 20

## 2017-07-01 MED ORDER — HYDROMORPHONE HCL 1 MG/ML IJ SOLN
1.0000 mg | INTRAMUSCULAR | Status: DC | PRN
  Administered 2017-07-01 – 2017-07-03 (×9): 1 mg via INTRAVENOUS
  Filled 2017-07-01 (×10): qty 1

## 2017-07-01 MED ORDER — FENTANYL 50 MCG/HR TD PT72
100.0000 ug | MEDICATED_PATCH | TRANSDERMAL | Status: DC
  Administered 2017-07-01 – 2017-07-04 (×2): 100 ug via TRANSDERMAL
  Filled 2017-07-01: qty 1
  Filled 2017-07-01: qty 2

## 2017-07-01 MED ORDER — TRANEXAMIC ACID 1000 MG/10ML IV SOLN: 500.0000 mg | Freq: Once | INTRAVENOUS | Status: DC

## 2017-07-01 MED ORDER — POTASSIUM PHOSPHATES 15 MMOLE/5ML IV SOLN
30.0000 meq | Freq: Once | INTRAVENOUS | Status: AC
  Administered 2017-07-01: 30 meq via INTRAVENOUS
  Filled 2017-07-01: qty 6.82

## 2017-07-01 MED ORDER — POTASSIUM CHLORIDE 10 MEQ/50ML IV SOLN
10.0000 meq | INTRAVENOUS | Status: AC
  Administered 2017-07-01 (×4): 10 meq via INTRAVENOUS
  Filled 2017-07-01 (×4): qty 50

## 2017-07-01 MED ORDER — SENNA 8.6 MG PO TABS
2.0000 | ORAL_TABLET | Freq: Every day | ORAL | Status: DC | PRN
  Administered 2017-07-05: 17.2 mg via ORAL
  Filled 2017-07-01: qty 2

## 2017-07-01 MED ORDER — SODIUM CHLORIDE 0.9 % IV SOLN
Freq: Once | INTRAVENOUS | Status: AC
  Administered 2017-07-01: 13:00:00 via INTRAVENOUS

## 2017-07-01 MED ORDER — OXYCODONE HCL 5 MG PO TABS
20.0000 mg | ORAL_TABLET | ORAL | Status: DC | PRN
  Administered 2017-07-01 – 2017-07-02 (×3): 20 mg via ORAL
  Filled 2017-07-01 (×4): qty 4

## 2017-07-01 MED ORDER — LORAZEPAM 2 MG/ML IJ SOLN
0.5000 mg | INTRAMUSCULAR | Status: DC | PRN
  Administered 2017-07-01 – 2017-07-03 (×3): 0.5 mg via INTRAVENOUS
  Filled 2017-07-01 (×3): qty 1

## 2017-07-01 MED ORDER — LIDOCAINE VISCOUS 2 % MT SOLN
15.0000 mL | Freq: Once | OROMUCOSAL | Status: DC
  Filled 2017-07-01: qty 15

## 2017-07-01 NOTE — Progress Notes (Signed)
  Echocardiogram 2D Echocardiogram has been performed.  Merrie Roof F 07/01/2017, 3:20 PM

## 2017-07-01 NOTE — Progress Notes (Addendum)
CRITICAL VALUE ALERT  Critical Value:  Calcium 5.6  Date & Time Notied:  07/01/17  Provider Notified: Dr. Criss Rosales  Orders Received/Actions taken: No new orders received

## 2017-07-01 NOTE — Progress Notes (Signed)
OT Cancellation Note  Patient Details Name: [name] MRN: [id number] DOB: 1955-07-21   Cancelled Treatment:    Reason Eval/Treat Not Completed: Patient not medically appropriate at this time. OT will continue to follow patient and will check tomorrow.   Mark 07/01/2017, 11:15 AM  Hulda Humphrey OTR/L [phone]

## 2017-07-01 NOTE — Progress Notes (Signed)
CRITICAL VALUE ALERT  Critical Value:  K 2.1, Calcium 5.3  Date & Time Notied: 06/30/17 called to RN at 2246  Provider Notified: Dr. Criss Rosales  Orders Received/Actions taken: Orders received

## 2017-07-01 NOTE — Progress Notes (Signed)
Inpatient Diabetes Program Recommendations  AACE/ADA: New Consensus Statement on Inpatient Glycemic Control (2015)  Target Ranges:  Prepandial:   less than 140 mg/dL      Peak postprandial:   less than 180 mg/dL (1-2 hours)      Critically ill patients:  140 - 180 mg/dL   Lab Results  Component Value Date   GLUCAP 228 (H) 07/01/2017   HGBA1C 5.3 11/10/2014    Review of Glycemic Control Results for [name], [name] (MRN [id number]) as of 07/01/2017 12:00  Ref. Range 06/30/2017 07:58 06/30/2017 11:29 06/30/2017 16:27 06/30/2017 21:10 07/01/2017 10:07  Glucose-Capillary Latest Ref Range: 65 - 99 mg/dL 237 (H) 183 (H) 338 (H) 347 (H) 228 (H)    Diabetes history: None Outpatient Diabetes medications: None Current orders for Inpatient glycemic control:  Novolog moderate tid with meals and HS Solucortef 50 mg IV q 6 hours  Inpatient Diabetes Program Recommendations:   If appropriate, may consider adding Levemir 6 units bid while on steroids.    Thanks,  Adah Perl, RN, BC-ADM Inpatient Diabetes Coordinator Pager [phone] (8a-5p)

## 2017-07-01 NOTE — Progress Notes (Addendum)
Family Medicine Teaching Service Daily Progress Note Intern Pager: [phone]  Patient name: [name] Medical record number: [id number] Date of birth: [birth year] Age: 62 y.o. Gender: female  Primary Care Provider: Nuala Alpha, DO Consultants: Hematology, palliative care, CCM, ID Code Status: Full   Pt Overview and Major Events to Date:  Admitted to [facility] on 06/26/17 for thromboyctopenia  Transferred to ICU on 06/27/17 Transferred back to [facility] on 06/29/17  Assessment and Plan: [name] Templetonis a 62 y.o.femalepresenting with thrombocytopenia. PMH is significant forStage IV adenocarcinoma of unknown primary versus NSCLC, recent PE with bilateral DVT (currently on Eliquis)bony metastasis throughout spine,ribs, sacrum, tobacco abuse, chronic pancreatitis s/p biliary stenting, MDD, anxiety, alcohol abuse, probable COPD  Acute on chronic hypoxemic respiratory failure  2/2 pulmonary edema. Patient with worsening respiratory status and needed to be placed on NRB @ 15L, satting @97 %. Lung exam showing diffuse crackles. Per Dr. Marin Olp, patient may be bleeding into her lungs. CXR stable from 06/30/17.  -will stop IVF -CCM consulted, appreciate recommendations -palliative care consulted, appreciate recommendations  -lasix 40mg  IV bid   Pseudomonal Bacteremia with septic shock Blood cultures growing pseudomonas auriginosa. Currently afebrile. WBC 4.1. LA downtrending from 3.5>2.8. S/pvanc/zosyn(3/10-3/11)for broad spectrum coverage -ID consulted, appreciate recommendations: cefepime x 10 days total -continue cefepime (3/11-), per ID will not need double coverage with gentamicin. Recommend continuing cefepime till 3/19 -continue stress dose steroids  -consider adding gentamicin for double coverage.   Thrombocytopeniawith anemia Platelets continue to drop, with am platelets at 21. Hgb slightly improved to 10.3. Likely multifactorial with recent initiation of chemotherapy along  with likely bone marrow infiltration by malignancy. Per hematology pseudomonas infections can also destroy platelets. S/p3Units platelets and 2 Units PRBCs. Blood smear showing immature granulocytes. Patient currently having nose and gum bleeding. DIC panel showing elevated PT, elevated fibrinogen and elevated d-dimer.  -hematology consulted, appreciate recommendations: recommend half dose Eliquis at 2.5 mg bid, outpatient ENT follow up -monitor on daily CBC -transfuse per hematology recommendations, transfusion planned today -have contacted Dr. Marin Olp via telephone. Dr. Marin Olp states that patient would benefit from ENT consultation but does not know if she will be able tolerate. Patient can also benefit from cocaine nasal spray or topical TXA.    -will transfuse 2U platelets today  -repeat INR on 3/15 -discontinue Eliquis  -Afrin nasal spray   Loose stools  Patient reporting multiple loose bowel movements. C diff panel negative -will discontinue bowel regimen  -continue to monitor   Recent PE with bilateralDVT Stable. Recently admitted in February for PE in RLL involving segmental and more peripehral branches and found to have bilateral DVTs. Hypercoagulable given h/o malignancy. Patient was treated with Eliquis and was instructed to continue Eliquis5mg  bidx 3 months. -monitor for s/sx of PE -discontinue 1/2 dose Eliquis, can consider restarting on 3/15  Stage IV Right Lung Cancer Adenocarcinoma of unknown primary vsNSCLC with systemic bony metastatic disease. S/p 1 mo of radiation therapy(last ttx 2/21). She has also had a Port-A-Cath placed on right chest 2/20. Possible abdominal mets seen on CT abdomen on 2/13. No mets seen on brain MR 2/17. Receives 50-75 mcg of fentanyl via 72-hour patch and 20 oxycodone every12hours as needed, dilaudid 2mg  q4hrs prn.Chemotherapy initiated on 06/21/17 -Continue homepain regimen -Continue with outpatient management -Bowel regimen Colace  and MiraLAX held for loose stools -Wound care per nursing for Port-A-Cath incision -palliative care consultation, appreciate recommendations: recommend possible change in pain regimen, continue zofran with addition of compazine  Hypokalemia K of 3.5. Mg  of 1.8 -repleted with Kdurx2, KPhos IV 34mEq in 569mL, oral K phos pack   Hypocalcemia Ca of5.6with albumin of 1.4.Corrected Ca 7.5. Mg of 1.8 -repleted with Ca gluconate 2g  Hyponatremia Na of129, 126 on admission. Likely 2/2 volume overload  -discontinued NS @100cc  due to edema, goal to increase by 4-76meq in 24 hours -continue diureses as described above -fluid restricted diet   Hypophosphatemia Phosphorus of 1.9 -repleted with KPhos IV 64mEq in 530mL and oral K phos pack   Systemic weakness, worsening Likely due to deconditioning in the setting of metastatic disease. -PT/OT -Nutrition consult  Chronic pancreatitis s/p biliary stenting, stable Likely due to alcohol abuse given patient's history per the chart. Patient has a biliary stent on 11/30. Has been endorsing abdominal pain however abdominal exam is unremarkable. Recent imaging on 2/13 CT abdominal pelvis did not show any acute findings consistent with pancreatitis.  -continue to monitor abdominal exam  Adjustment disorder with anxious features Acute.She did not have this before and it has been less than 3 months of symptoms according to the patient.Home meds: ativa 1mg  tid prn, mirtazapine 7.5mg  daily, -continue home meds  Tobacco abuse -Nicotine replacement patches as needed -Nursing to provide smoking cessation counseling -encouraged smoking cessation given supplemental O2 use at home, patient reports that she no longer smokes since starting oxygen -Patient continues to smoke while using supplemental O2 at home, will need aggressive counseling on dangers of smoking with O2 use  Wound Patient describes vesicular rash on buttock. Vesicles have  all been popped on exam.  -wound care consulted, appreciate recommendations  Neurogenic Bladder Patient reports no urine output and no output charted. Abdomen distended and tender.Possibly 2/2 to increased pain medication -maintain foley catheter  Protein Calorie Malnutrition  Albumin of 1.6 -nutrition consulted, appreciate recommendations  FEN/GI:regular diet, protonix 40mg  daily Prophylaxis:SCDs  Disposition: continued inpatient stay   Subjective:  Patient this morning very angry. States she thought she was going to die last evening. States her breathing "has its moments" and waxes and wanes with improvement. States continued pain but has not received pain medication. Upset stating that she feels like nothing is being done for her. I explained that she has continued IV treatments and antibiotics and multiple specialities are involved in her care. [name], boyfriend, also reassuring patient that hospital staff working hard to help patient.   Objective: Temp:  [97.6 F (36.4 C)-101.2 F (38.4 C)] 97.6 F (36.4 C) (03/14 1315) Pulse Rate:  [82-111] 103 (03/14 1300) Resp:  [13-24] 14 (03/14 1300) BP: (119-156)/(58-92) 133/75 (03/14 1300) SpO2:  [91 %-98 %] 94 % (03/14 1314) FiO2 (%):  [55 %] 55 % (03/13 1900) Physical Exam: General: awake and alert, laying in bed, intermittently removing NRB mask, oriented x3 Cardiovascular: RRR, no MRG  Respiratory: diffuse crackles throughout, no increased work of breathing  Abdomen: soft, non tender, non distended, bowel sounds present  Extremities: 3+ edema in feet, 1+ edema in legs, 2+ edema arms to elbows bilaterally   Laboratory: Recent Labs  Lab 06/29/17 1751 06/30/17 0327 06/30/17 0705 07/01/17 0509  WBC 5.5 4.1  --  3.8*  HGB 11.2* 9.6*  --  10.3*  HCT 32.1* 27.9*  --  29.9*  PLT 22* 14* 14* 21*   Recent Labs  Lab 06/29/17 0340  06/30/17 0327 06/30/17 1300 06/30/17 2100 07/01/17 0509  NA 131*   < > 130* 130*  125* 129*  K 2.7*   < > 2.4* 3.1* 2.1* 3.5  CL 103   < >  100* 96* 86* 88*  CO2 21*   < > 21* 22 25 28   BUN 11   < > 9 9 8 9   CREATININE 0.65   < > 0.61 0.71 0.73 0.71  CALCIUM 5.8*   < > 5.6* 5.7* 5.3* 5.6*  PROT 4.9*  --  4.7*  --   --  5.5*  BILITOT 0.5  --  0.4  --   --  0.8  ALKPHOS 136*  --  136*  --   --  159*  ALT 32  --  28  --   --  30  AST 30  --  25  --   --  30  GLUCOSE 184*   < > 267* 298* 353* 202*   < > = values in this interval not displayed.    CBG (last 3)  Recent Labs    06/30/17 2110 07/01/17 1007 07/01/17 1228  GLUCAP 347* 228* 224*     Imaging/Diagnostic Tests: Dg Chest 2 View  Result Date: 06/27/2017 CLINICAL DATA:  Fever. EXAM: CHEST - 2 VIEW COMPARISON:  Chest radiographs and CTA 06/13/2017 FINDINGS: Right jugular Port-A-Cath terminates over the lower SVC. The cardiac silhouette is normal in size. Aortic atherosclerosis is noted. There is a moderate-sized hiatal hernia. The lungs are hyperinflated with underlying emphysema. Peribronchial thickening and coarsened interstitial markings bilaterally are mildly increased compared to the prior radiographs with minimal patchy density in the left perihilar and right lower lungs. Trace pleural effusions are questioned. No pneumothorax is identified. IMPRESSION: COPD with mildly increased predominantly interstitial type densities bilaterally which may reflect superimposed acute infection, possibly atypical or viral. Electronically Signed   By: Logan Bores M.D.   On: 06/27/2017 07:51   Dg Chest 2 View  Result Date: 06/13/2017 CLINICAL DATA:  Shortness of breath.  History of lung cancer. EXAM: CHEST  2 VIEW COMPARISON:  August 01, 2014 FINDINGS: No pneumothorax. The heart, hila, and mediastinum are unremarkable. Small hiatal hernia. The nodularity in the right upper lung on the recent PET-CT is not appreciated on this study. IMPRESSION: No cause for shortness of breath or chest pain identified. The no nodularity in the  right apex is not well assessed with this study. Electronically Signed   By: Dorise Bullion III M.D   On: 06/13/2017 10:29   Ct Head Wo Contrast  Result Date: 06/27/2017 CLINICAL DATA:  History of lung carcinoma with confusion, initial encounter EXAM: CT HEAD WITHOUT CONTRAST TECHNIQUE: Contiguous axial images were obtained from the base of the skull through the vertex without intravenous contrast. COMPARISON:  06/05/2017 MRI, 03/02/2010 CT of the head FINDINGS: Brain: No evidence of acute infarction, hemorrhage, hydrocephalus, extra-axial collection or mass lesion/mass effect. Vascular: No hyperdense vessel or unexpected calcification. Skull: Changes of hemangiomas are noted similar to that seen on recent MRI. Sinuses/Orbits: Orbits are within normal limits. Mucosal thickening is noted within the maxillary antra bilaterally as well as within the ethmoid and sphenoid sinuses. Small air-fluid level is noted in the right maxillary antrum. Other: None IMPRESSION: Changes of sinusitis. No acute intracranial abnormality is noted. Electronically Signed   By: Inez Catalina M.D.   On: 06/27/2017 18:59   Ct Angio Chest Pe W And/or Wo Contrast  Result Date: 06/13/2017 CLINICAL DATA:  Shortness of breath.  Suspected PE. EXAM: CT ANGIOGRAPHY CHEST WITH CONTRAST TECHNIQUE: Multidetector CT imaging of the chest was performed using the standard protocol during bolus administration of intravenous contrast. Multiplanar CT image reconstructions and MIPs  were obtained to evaluate the vascular anatomy. CONTRAST:  62mL ISOVUE-370 IOPAMIDOL (ISOVUE-370) INJECTION 76% COMPARISON:  Chest CT May 25, 2017. Chest x-ray June 13, 2017 FINDINGS: Cardiovascular: The heart and coronary arteries are unchanged. Atherosclerotic changes are seen in the nonaneurysmal aorta. 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 No dissection. There is narrowing of right upper lobe pulmonary arterial branches due to soft tissue in the right  hilum. Pulmonary emboli are seen in the right lung bases involving segmental and more peripheral branches. No other emboli identified. Mediastinum/Nodes: There is soft tissue in the right hilum which encases the right upper lobe bronchus and upper lobe pulmonary arterial branches. This soft tissue measures up to 2.8 cm. No other adenopathy. There is a moderate hiatal hernia. The thyroid is normal. A Port-A-Cath is identified. No effusions. Lungs/Pleura: The spiculated nodule in the right upper lobe measures 1.6 by 1.0 cm today versus 1.4 x 0.9 cm previously. The small difference could be due to difference in slice selection given the short interval. Scattered scarring. Stable 5 mm nodule in the right lung on series 6, image 77. Other nodules are stable as well. No new masses. Mild ground-glass in the medial right lung on series 6, image 102 is new and could be infectious. No new nodules. No other infiltrates. Upper Abdomen: Air in the liver is stable, likely from previous sphincterotomy. Prominence of the left adrenal gland is likely hyperplasia without nodularity. No other abnormalities in the upper abdomen. Musculoskeletal: Bony metastatic disease again identified in the spine, manubrium, sternum, and ribs. Review of the MIP images confirms the above findings. IMPRESSION: 1. Pulmonary emboli in the right lower lobe involving segmental and more peripheral branches. 2. Mild ground-glass in the medial right lung could be infectious. 3. The spiculated nodule in the right upper lobe is not significantly changed accounting for difference in technique. 4. Increasing soft tissue in the right hilum with encasement of the right upper lobe bronchus and right upper lobe pulmonary arterial branches resulting in some narrowing. This is suspicious for disease involvement. 5. Known bony metastatic disease. 6. Atherosclerotic changes in the nonaneurysmal aorta. 7. Moderate hiatal hernia. Findings called to and discussed with Dr.  Ashok Cordia Aortic Atherosclerosis (ICD10-I70.0). Electronically Signed   By: Dorise Bullion III M.D   On: 06/13/2017 13:30   Mr Brain W Wo Contrast  Result Date: 06/06/2017 CLINICAL DATA:  Non-small cell lung cancer staging EXAM: MRI HEAD WITHOUT AND WITH CONTRAST TECHNIQUE: Multiplanar, multiecho pulse sequences of the brain and surrounding structures were obtained without and with intravenous contrast. CONTRAST:  35mL MULTIHANCE GADOBENATE DIMEGLUMINE 529 MG/ML IV SOLN COMPARISON:  Head CT 03/02/2010 FINDINGS: Brain: The midline structures are normal. There is no acute infarct or acute hemorrhage. No mass lesion, hydrocephalus, dural abnormality or extra-axial collection. The brain parenchymal signal is normal. No age-advanced or lobar predominant atrophy. No chronic microhemorrhage or superficial siderosis. Vascular: Major intracranial arterial and venous sinus flow voids are preserved. Skull and upper cervical spine: There are multiple T1 hyperintense foci within the calvarium, the largest in the right parietal bone. This also shows intrinsic T2 hyperintensity, likely a hemangioma. Sinuses/Orbits: Mild left sphenoid sinus mucosal thickening. No mastoid or middle ear effusion. Normal orbits. IMPRESSION: 1. No intracranial metastatic disease.  Normal brain. 2. 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 Electronically Signed   By: Ulyses Jarred M.D.   On: 06/06/2017 01:26   Ct Abdomen Pelvis W Contrast  Result Date: 06/02/2017  CLINICAL DATA:  Left upper quadrant abdominal pain and distention. Recently diagnosed with metastatic adenocarcinoma of unknown primary. Evaluate for diverticulitis. EXAM: CT ABDOMEN AND PELVIS WITH CONTRAST TECHNIQUE: Multidetector CT imaging of the abdomen and pelvis was performed using the standard protocol following bolus administration of intravenous contrast. CONTRAST:  160mL ISOVUE-300 IOPAMIDOL (ISOVUE-300) INJECTION 61%  COMPARISON:  PET-CT 05/26/2017. Abdominopelvic CT 05/25/2017 and 03/25/2016. FINDINGS: Lower chest: Patchy ground-glass opacities at both lung bases are stable. There is no significant pleural effusion. Moderate-sized hiatal hernia again noted. Hepatobiliary: The liver is normal in density without focal abnormality. Stable mild biliary dilatation and pneumobilia status post cholecystectomy. Pancreas: Multiple calcifications are present within the pancreatic head. There is a pancreatic stent in place with a small amount of air in the main pancreatic duct. The pancreatic body and tail appear normal. No acute surrounding inflammation. Spleen: Scattered small granulomas.  Normal in size. Adrenals/Urinary Tract: Stable generalized enlargement of the left adrenal gland consistent with hyperplasia. The right adrenal gland appears normal. Tiny low-density left renal lesions remain too small to characterize, but stable. No hydronephrosis or urinary tract calculus. The bladder appears normal. Stomach/Bowel: As above, moderate-size hiatal hernia. The stomach and small bowel appear normal. The proximal colon is fluid-filled. There are diverticular changes of the sigmoid colon with a fluid-filled inferiorly projecting diverticulum on axial image 64. There may be minimal inflammation in the surrounding fat. No evidence of bowel obstruction. Vascular/Lymphatic: There are no enlarged abdominal or pelvic lymph nodes. Scattered small retroperitoneal lymph nodes are stable. There is diffuse aortic and branch vessel atherosclerosis. No acute vascular findings. Reproductive: The uterus and ovaries appear normal. No adnexal mass. Probable pelvic floor laxity. Other: There is an enhancing soft tissue nodule in the anterior abdominal wall near the umbilicus, measuring 16 mm on image 41. Mildly hypermetabolic on recent PET-CT, probably a metastasis. No ascites, free air or peritoneal nodularity. Musculoskeletal: Multifocal osseous  metastatic disease, similar to recent studies. No pathologic fracture identified. IMPRESSION: 1. Possible mild sigmoid diverticulitis. No evidence of perforation, abscess or obstruction. 2. No other evidence of acute process. 3. Known metastatic disease to the bones. Probable enhancing metastasis within the anterior abdominal wall near the umbilicus. 4. Stable incidental findings including a moderate size hiatal hernia, mild biliary dilatation status post cholecystectomy, sequela of chronic calcific pancreatitis, left adrenal hyperplasia and Aortic Atherosclerosis (ICD10-I70.0). Electronically Signed   By: Richardean Sale M.D.   On: 06/02/2017 15:48   Ir US Guide Vasc Access Right  Result Date: 06/09/2017 CLINICAL DATA:  Lung cancer EXAM: TUNNEL POWER PORT PLACEMENT WITH SUBCUTANEOUS POCKET UTILIZING ULTRASOUND & FLOUROSCOPY FLUOROSCOPY TIME:  30 seconds.  Five mGy. MEDICATIONS AND MEDICAL HISTORY: Versed 4 mg, Fentanyl 100 mcg. Additional Medications: Ancef 2 g. Antibiotics were given within 2 hours of the procedure. ANESTHESIA/SEDATION: Moderate sedation time: 26 minutes. Nursing monitored the the patient during the procedure. PROCEDURE: After written informed consent was obtained, patient was placed in the supine position on angiographic table. The right neck and chest was prepped and draped in a sterile fashion. Lidocaine was utilized for local anesthesia. The right jugular vein was noted to be patent initially with ultrasound. Under sonographic guidance, a micropuncture needle was inserted into the right IJ vein (Ultrasound and fluoroscopic image documentation was performed). The needle was removed over an 018 wire which was exchanged for a Amplatz. This was advanced into the IVC. An 8-French dilator was advanced over the Amplatz. A small incision was made in the right upper  chest over the anterior right second rib. Utilizing blunt dissection, a subcutaneous pocket was created in the caudal direction. The  pocket was irrigated with a copious amount of sterile normal saline. The port catheter was tunneled from the chest incision, and out the neck incision. The reservoir was inserted into the subcutaneous pocket and secured with two 3-0 Ethilon stitches. A peel-away sheath was advanced over the Amplatz wire. The port catheter was cut to measure length and inserted through the peel-away sheath. The peel-away sheath was removed. The chest incision was closed with 3-0 Vicryl interrupted stitches for the subcutaneous tissue and a running of 4-0 Vicryl subcuticular stitch for the skin. The neck incision was closed with a 4-0 Vicryl subcuticular stitch. Derma-bond was applied to both surgical incisions. The port reservoir was flushed and instilled with heparinized saline. No complications. FINDINGS: A right IJ vein Port-A-Cath is in place with its tip at the cavoatrial junction. COMPLICATIONS: None IMPRESSION: Successful 8 French right internal jugular vein power port placement with its tip at the SVC/RA junction. Electronically Signed   By: Marybelle Killings M.D.   On: 06/09/2017 16:41   Dg Chest Port 1 View  Result Date: 07/01/2017 CLINICAL DATA:  62 year old female with metastatic lung cancer. Shortness of breath and fever. Pulmonary emboli last month. EXAM: PORTABLE CHEST 1 VIEW COMPARISON:  06/30/2017 and earlier. FINDINGS: Portable AP upright view at 0555 hours. Stable right chest porta cath, accessed. Diffuse reticulonodular pulmonary opacity has developed since February, is progressed since 06/28/2017, but stable since yesterday. No associated pneumothorax or pleural effusion. Stable cardiac size and mediastinal contours. Moderate hiatal hernia re-demonstrated. Negative visible bowel gas pattern. IMPRESSION: Progressed diffuse reticulonodular pulmonary opacity since 06/28/2017, stable since yesterday. Differential considerations in this clinical setting include acute viral/atypical infection, lymphangitic  carcinomatosis, and less likely acute interstitial edema. Electronically Signed   By: Genevie Ann M.D.   On: 07/01/2017 09:21   Dg Chest Port 1 View  Result Date: 06/30/2017 CLINICAL DATA:  Increasing shortness of breath, history of lung carcinoma EXAM: PORTABLE CHEST 1 VIEW COMPARISON:  06/28/2017 FINDINGS: Right-sided chest wall port is again noted and stable. Cardiac shadow is stable. Diffuse increased interstitial changes are identified increased from the prior exam consistent with pulmonary edema. No focal confluent infiltrate is seen. No sizable effusion is noted. No bony abnormality is seen. IMPRESSION: Increasing interstitial changes consistent with edema. Electronically Signed   By: Inez Catalina M.D.   On: 06/30/2017 08:13   Dg Chest Port 1 View  Result Date: 06/28/2017 CLINICAL DATA:  Septic shock.  History of lung carcinoma EXAM: PORTABLE CHEST 1 VIEW COMPARISON:  June 27, 2017 chest radiograph and chest CT June 13, 2017 FINDINGS: There is diffuse interstitial prominence consistent with either pulmonary edema or lymphangitic spread of tumor. There is atelectatic change in the left base with questionable airspace consolidation immediately adjacent to a hiatal hernia. Heart is upper normal in size. There is mild pulmonary venous hypertension. There is aortic atherosclerosis. There is no adenopathy apparent by radiography. Central catheter tip is in the superior vena cava near the cavoatrial junction. No pneumothorax. Bony metastases are better appreciated on recent CT than on this study, although several ribs show evidence suggesting bony metastatic disease. Moderate hiatal hernia noted. There is aortic atherosclerosis. IMPRESSION: Interstitial prominence. Question a degree of pulmonary edema versus lymphangitic spread of tumor. Both entities may exist concurrently. Note that there is pulmonary venous hypertension. Question atelectasis versus early pneumonia left base. Moderate hiatal hernia.  Stable cardiac silhouette. Bony metastatic disease, better seen on recent CT. There is aortic atherosclerosis. Aortic Atherosclerosis (ICD10-I70.0). Electronically Signed   By: Lowella Grip III M.D.   On: 06/28/2017 07:55   Mm Screening Breast Tomo Bilateral  Result Date: 06/10/2017 CLINICAL DATA:  Screening. EXAM: DIGITAL SCREENING BILATERAL MAMMOGRAM WITH TOMO AND CAD COMPARISON:  Previous exam(s). ACR Breast Density Category c: The breast tissue is heterogeneously dense, which may obscure small masses. FINDINGS: There are no findings suspicious for malignancy. Images were processed with CAD. IMPRESSION: No mammographic evidence of malignancy. A result letter of this screening mammogram will be mailed directly to the patient. RECOMMENDATION: Screening mammogram in one year. (Code:[id number]) BI-RADS CATEGORY  1: Negative. Electronically Signed   By: Lajean Manes M.D.   On: 06/10/2017 09:40   Ir Fluoro Guide Port Insertion Right  Result Date: 06/09/2017 CLINICAL DATA:  Lung cancer EXAM: TUNNEL POWER PORT PLACEMENT WITH SUBCUTANEOUS POCKET UTILIZING ULTRASOUND & FLOUROSCOPY FLUOROSCOPY TIME:  30 seconds.  Five mGy. MEDICATIONS AND MEDICAL HISTORY: Versed 4 mg, Fentanyl 100 mcg. Additional Medications: Ancef 2 g. Antibiotics were given within 2 hours of the procedure. ANESTHESIA/SEDATION: Moderate sedation time: 26 minutes. Nursing monitored the the patient during the procedure. PROCEDURE: After written informed consent was obtained, patient was placed in the supine position on angiographic table. The right neck and chest was prepped and draped in a sterile fashion. Lidocaine was utilized for local anesthesia. The right jugular vein was noted to be patent initially with ultrasound. Under sonographic guidance, a micropuncture needle was inserted into the right IJ vein (Ultrasound and fluoroscopic image documentation was performed). The needle was removed over an 018 wire which was exchanged for a Amplatz.  This was advanced into the IVC. An 8-French dilator was advanced over the Amplatz. A small incision was made in the right upper chest over the anterior right second rib. Utilizing blunt dissection, a subcutaneous pocket was created in the caudal direction. The pocket was irrigated with a copious amount of sterile normal saline. The port catheter was tunneled from the chest incision, and out the neck incision. The reservoir was inserted into the subcutaneous pocket and secured with two 3-0 Ethilon stitches. A peel-away sheath was advanced over the Amplatz wire. The port catheter was cut to measure length and inserted through the peel-away sheath. The peel-away sheath was removed. The chest incision was closed with 3-0 Vicryl interrupted stitches for the subcutaneous tissue and a running of 4-0 Vicryl subcuticular stitch for the skin. The neck incision was closed with a 4-0 Vicryl subcuticular stitch. Derma-bond was applied to both surgical incisions. The port reservoir was flushed and instilled with heparinized saline. No complications. FINDINGS: A right IJ vein Port-A-Cath is in place with its tip at the cavoatrial junction. COMPLICATIONS: None IMPRESSION: Successful 8 French right internal jugular vein power port placement with its tip at the SVC/RA junction. Electronically Signed   By: Marybelle Killings M.D.   On: 06/09/2017 16:41     Caroline More, DO 07/01/2017, 2:07 PM PGY-1, [facility] pager: [phone], text pages welcome

## 2017-07-01 NOTE — Progress Notes (Addendum)
0015 Discussed with Dr. Hilma Favors pt's thirst and low Na level. New orders received. Per Dr. Hilma Favors, let pt drink as she wants for comfort care. Pain medication adjusted, WCTM.   0230 Pt medicated for pain. See MAR. Pt on and off sleeping. WCTM.   0315 Pt with labored breathing with bouts of apnea, easy to arouse, WCTM.   0630 RN called to room, medicated for pain. Lines, tubes, gtts checked and verified. Awaiting day shift RN for report.

## 2017-07-01 NOTE — Progress Notes (Signed)
   07/01/17 1850  Note  Observations pt more relaxed this shift. pt states feeling better and breathing better today. PRN meds  given as ordered. spoke to MD this am regarding pt care, orders received and acknowledge. pt still gets tearful sometimes and and anxious. family still at beside. will continue to monitor closely.

## 2017-07-01 NOTE — Progress Notes (Signed)
[facility] for Infectious Disease   Reason for visit: Follow up on Pseudomonas bacteremia  Interval History: Tmax 101.2, WBC 3.8, no associated rash; some loose stools.  Eating this am.  CXR personally reviewed and confirm a reticular pattern.   Cefepime day 4 Total antibiotics day 5  Physical Exam: Constitutional:  Vitals:   07/01/17 0600 07/01/17 0700  BP: 130/63 124/63  Pulse: 88 84  Resp: 18 14  Temp:    SpO2: 97%    patient appears in some distress with SOB Eyes: anicteric HENT: on facemask Respiratory: increased respiratory effort; CTA B Cardiovascular: tachy RR  Review of Systems: Pulmonary: sob Integument/breast: negative for rash  Lab Results  Component Value Date   WBC 3.8 (L) 07/01/2017   HGB 10.3 (L) 07/01/2017   HCT 29.9 (L) 07/01/2017   MCV 81.7 07/01/2017   PLT 21 (LL) 07/01/2017    Lab Results  Component Value Date   CREATININE 0.71 07/01/2017   BUN 9 07/01/2017   NA 129 (L) 07/01/2017   K 3.5 07/01/2017   CL 88 (L) 07/01/2017   CO2 28 07/01/2017    Lab Results  Component Value Date   ALT 30 07/01/2017   AST 30 07/01/2017   ALKPHOS 159 (H) 07/01/2017     Microbiology: Recent Results (from the past 240 hour(s))  Culture, blood (Routine X 2) w Reflex to ID Panel     Status: Abnormal   Collection Time: 06/27/17  6:07 AM  Result Value Ref Range Status   Specimen Description BLOOD LEFT HAND  Final   Special Requests IN PEDIATRIC BOTTLE Blood Culture adequate volume  Final   Culture  Setup Time   Final    GRAM NEGATIVE RODS IN PEDIATRIC BOTTLE CRITICAL RESULT CALLED TO, READ BACK BY AND VERIFIED WITH: A.MASTERS,PHARMD AT 2202 ON 06/28/17 BY G.MCADOO Performed at [facility], [place] [address], [place], [place] [address]    Culture PSEUDOMONAS AERUGINOSA (A)  Final   Report Status 06/30/2017 FINAL  Final   Organism ID, Bacteria PSEUDOMONAS AERUGINOSA  Final      Susceptibility   Pseudomonas aeruginosa - MIC*    CEFTAZIDIME 4  SENSITIVE Sensitive     CIPROFLOXACIN <=0.25 SENSITIVE Sensitive     GENTAMICIN <=1 SENSITIVE Sensitive     IMIPENEM 1 SENSITIVE Sensitive     PIP/TAZO 8 SENSITIVE Sensitive     CEFEPIME <=1 SENSITIVE Sensitive     * PSEUDOMONAS AERUGINOSA  Blood Culture ID Panel (Reflexed)     Status: Abnormal   Collection Time: 06/27/17  6:07 AM  Result Value Ref Range Status   Enterococcus species NOT DETECTED NOT DETECTED Final   Listeria monocytogenes NOT DETECTED NOT DETECTED Final   Staphylococcus species NOT DETECTED NOT DETECTED Final   Staphylococcus aureus NOT DETECTED NOT DETECTED Final   Streptococcus species NOT DETECTED NOT DETECTED Final   Streptococcus agalactiae NOT DETECTED NOT DETECTED Final   Streptococcus pneumoniae NOT DETECTED NOT DETECTED Final   Streptococcus pyogenes NOT DETECTED NOT DETECTED Final   Acinetobacter baumannii NOT DETECTED NOT DETECTED Final   Enterobacteriaceae species NOT DETECTED NOT DETECTED Final   Enterobacter cloacae complex NOT DETECTED NOT DETECTED Final   Escherichia coli NOT DETECTED NOT DETECTED Final   Klebsiella oxytoca NOT DETECTED NOT DETECTED Final   Klebsiella pneumoniae NOT DETECTED NOT DETECTED Final   Proteus species NOT DETECTED NOT DETECTED Final   Serratia marcescens NOT DETECTED NOT DETECTED Final   Carbapenem resistance  NOT DETECTED NOT DETECTED Final   Haemophilus influenzae NOT DETECTED NOT DETECTED Final   Neisseria meningitidis NOT DETECTED NOT DETECTED Final   Pseudomonas aeruginosa DETECTED (A) NOT DETECTED Final    Comment: CRITICAL RESULT CALLED TO, READ BACK BY AND VERIFIED WITH: A.MASTERS,PHARMD AT 9390 ON 06/28/17 BY G.MCADOO    Candida albicans NOT DETECTED NOT DETECTED Final   Candida glabrata NOT DETECTED NOT DETECTED Final   Candida krusei NOT DETECTED NOT DETECTED Final   Candida parapsilosis NOT DETECTED NOT DETECTED Final   Candida tropicalis NOT DETECTED NOT DETECTED Final    Comment: Performed at [facility], [place] [address] [place], [place] [address]  Culture, blood (Routine X 2) w Reflex to ID Panel     Status: Abnormal   Collection Time: 06/27/17  6:12 AM  Result Value Ref Range Status   Specimen Description BLOOD LEFT HAND  Final   Special Requests IN PEDIATRIC BOTTLE Blood Culture adequate volume  Final   Culture  Setup Time   Final    GRAM NEGATIVE RODS IN PEDIATRIC BOTTLE CRITICAL VALUE NOTED.  VALUE IS CONSISTENT WITH PREVIOUSLY REPORTED AND CALLED VALUE.    Culture (A)  Final    PSEUDOMONAS AERUGINOSA SUSCEPTIBILITIES PERFORMED ON PREVIOUS CULTURE WITHIN THE LAST 5 DAYS. Performed at [facility], [place] [address], [place], [place] [address]    Report Status 06/30/2017 FINAL  Final  MRSA PCR Screening     Status: None   Collection Time: 06/27/17  5:40 PM  Result Value Ref Range Status   MRSA by PCR NEGATIVE NEGATIVE Final    Comment:        The GeneXpert MRSA Assay (FDA approved for NASAL specimens only), is one component of a comprehensive MRSA colonization surveillance program. It is not intended to diagnose MRSA infection nor to guide or monitor treatment for MRSA infections. Performed at [facility], [place] [address] [place], [place] [address]   Culture, Urine     Status: None   Collection Time: 06/27/17 10:58 PM  Result Value Ref Range Status   Specimen Description URINE, RANDOM  Final   Special Requests NONE  Final   Culture   Final    NO GROWTH Performed at [facility], [place] [address], [place], [place] [address]    Report Status 06/29/2017 FINAL  Final  C difficile quick scan w PCR reflex     Status: None   Collection Time: 06/29/17  9:20 PM  Result Value Ref Range Status   C Diff antigen NEGATIVE NEGATIVE Final   C Diff toxin NEGATIVE NEGATIVE Final   C Diff interpretation No C. difficile detected.  Final    Comment: Performed at [facility], [place] [address], [place], [place] [address]    Impression/Plan:  1. Pseudomonas  bacteremia - on cefepime and will continue.  Duration will depend on progress.   2.  Thrombocytopenia - persistent and worsened with acute illness.  Getting a transufusion again today per Dr. Marin Olp.    3. Reticulonodular opacities on CXR - unclear etiology if edema or blood.  CCM to see patient today.

## 2017-07-01 NOTE — Progress Notes (Signed)
  Echocardiogram 2D Echocardiogram has been performed.  Merrie Roof F 07/01/2017, 11:59 AM

## 2017-07-01 NOTE — Progress Notes (Signed)
   0225 Fentanyl 13mcg patch wasted in sharps container, witnessed by Juanda Crumble, RN at bedside.

## 2017-07-01 NOTE — Progress Notes (Signed)
1915 Bedside shift report, pt resting in bed, significant other at bedside. NAD, VSS, WCTM.   2000 Pt assessed, see flow sheet. Pt confused, very impulsive. Takes off venti mask frequently and desaturating. Pt reminded to keep it on and gets upset when instructed to wear. Pt's significant other at bedside, frequently redirecting pt to wear oxygen mask and quit pulling at lines, linens, taking mask off for frequent sips of water. Pt states she is very thirsty, asking RN for multiple cups of water. Pt distracted easily, VSS, WCTM.   2100 Pt medicated per MAR. C/O generalized pain, has moments of crying, asks RN if she's dying. Pt comforted by RN and significant other. Pt still keeps desaturating, taking off mask for frequent sips of water. Pt takes multiple minutes to recover O2 saturation to low 90s. Pt switched to NRB mask. Reminded pt not to take off mask. WCTM.   2245 Lab called with critical results. RN paged MD, Dr. Criss Rosales. Awaiting new orders.   2310 New orders received. Pt with more confusion, increased work of breathing and temp. Dr. Criss Rosales paged again regarding pt's change in status, will come by to see pt.   2345 Palliative MD, Dr. Hilma Favors here to see pt. Long discussion with significant other. New orders placed.

## 2017-07-01 NOTE — Progress Notes (Signed)
FPTS Interim Progress Note  Critical lab values of K 2.3 and Ca 5.7 called. Albumin of 1.9 so corrected Ca of 7.4. Phos of 1.6.  -will check Mg -STAT EKG -replete with calcium gluconate -replete with 30 mEq potassium phosphate over 6 hours -will recheck labs in 7 hours   Caroline More, DO 07/01/2017, 9:48 PM PGY-1, [facility] pager [phone]

## 2017-07-01 NOTE — Progress Notes (Addendum)
1900 Bedside shift report. Pt resting in bed, dozing off during shift report. Pt denies pain, NAD, pt on [place] while eating and NRB mask while resting in bed. Pt with frequent bouts of desaturation while wearing [place]. Pt and significant other aware about risks of desaturating. RN educated [name], and [name] able to understand without assistance. Fall precautions in place. WCTM.   1950 Pt assessed, see flow sheet. Pt able to answer some questions appropriately, other times confused. Lines, tubes, gtts, checked and verified. Blood sent to lab, foley care performed. Pt repositioned in bed. Pt denies distress at this time. WCTM.   2215 Pt medicated per MAR. New IV started, pt tolerated well. Significant other at bedside. WCTM.   2345 Pt sleeping comfortably, easy to arouse. NAD, no needs at this time. WCTM.

## 2017-07-01 NOTE — Progress Notes (Signed)
Unfortunately, Ms. Minogue is having a tough time.  She is on facemask oxygen.  She had a chest x-ray yesterday which showed pulmonary edema.  I actually worry that she may be bleeding into her lungs.  She is had no hemoptysis.  She has had some slight nosebleed.  I will go ahead and transfuse her 2 units of platelets today.  She has a Pseudomonas bacteremia.  This is sensitive to Maxipime.  However, I suspect it will take a good 7-10 days before we start seeing improvement.  Pseudomonas is incredibly adept at destroying platelets because of toxin production.  Her potassium is better.  Her boyfriend says she is eating a little bit better.  She has had a little bit of a cough.  There is been no obvious diarrhea.  Her blood sugars are little on the high side but I am really not too worried about this.  I really think that her caloric intake is a priority.  I note she is fighting hard.  She is doing her best to try to get better.  Having Pseudomonas in the blood is definitely a detriment right now.  Again, Pseudomonas has a mortality with bacteremia.  Thankfully, she is not neutropenic.  I know that the staff in the cardiac ICU are really doing a tremendous job with her.   Lattie Haw, MD  Romans 5:3-5

## 2017-07-01 NOTE — Progress Notes (Signed)
PT Cancellation Note  Patient Details Name: [name] MRN: [id number] DOB: 1956-02-01   Cancelled Treatment:    Reason Eval/Treat Not Completed: Patient not medically ready. Will follow-up for PT treatment when medically appropriate.  [name], PT, DPT Acute Rehab Services  Pager: [phone] 07/01/2017, 12:13 PM

## 2017-07-01 NOTE — Progress Notes (Signed)
CRITICAL VALUE ALERT  Critical Value:  K2.3 & calcium 5.7  Date & Time Notied:  07/01/17 at 2139  Provider Notified: Dr. Tammi Klippel  Orders Received/Actions taken: Orders received

## 2017-07-02 ENCOUNTER — Inpatient Hospital Stay (HOSPITAL_COMMUNITY): Payer: BLUE CROSS/BLUE SHIELD

## 2017-07-02 DIAGNOSIS — J8489 Other specified interstitial pulmonary diseases: Secondary | ICD-10-CM

## 2017-07-02 LAB — CBC
HCT: 27.9 % — ABNORMAL LOW (ref 36.0–46.0)
Hemoglobin: 9.5 g/dL — ABNORMAL LOW (ref 12.0–15.0)
MCH: 28.1 pg (ref 26.0–34.0)
MCHC: 34.1 g/dL (ref 30.0–36.0)
MCV: 82.5 fL (ref 78.0–100.0)
PLATELETS: 33 10*3/uL — AB (ref 150–400)
RBC: 3.38 MIL/uL — ABNORMAL LOW (ref 3.87–5.11)
RDW: 16.7 % — AB (ref 11.5–15.5)
WBC: 4.7 10*3/uL (ref 4.0–10.5)

## 2017-07-02 LAB — BPAM PLATELET PHERESIS
Blood Product Expiration Date: 201903142359
Blood Product Expiration Date: 201903142359
ISSUE DATE / TIME: 201903141307
ISSUE DATE / TIME: 201903141610
UNIT TYPE AND RH: 7300
Unit Type and Rh: 8400

## 2017-07-02 LAB — MAGNESIUM
MAGNESIUM: 1.4 mg/dL — AB (ref 1.7–2.4)
MAGNESIUM: 1.3 mg/dL — AB (ref 1.7–2.4)

## 2017-07-02 LAB — PREPARE PLATELET PHERESIS
UNIT DIVISION: 0
Unit division: 0

## 2017-07-02 LAB — COMPREHENSIVE METABOLIC PANEL
ALBUMIN: 1.8 g/dL — AB (ref 3.5–5.0)
ALK PHOS: 163 U/L — AB (ref 38–126)
ALT: 28 U/L (ref 14–54)
AST: 29 U/L (ref 15–41)
Anion gap: 13 (ref 5–15)
BILIRUBIN TOTAL: 0.7 mg/dL (ref 0.3–1.2)
BUN: 11 mg/dL (ref 6–20)
CALCIUM: 5.6 mg/dL — AB (ref 8.9–10.3)
CO2: 32 mmol/L (ref 22–32)
CREATININE: 0.68 mg/dL (ref 0.44–1.00)
Chloride: 86 mmol/L — ABNORMAL LOW (ref 101–111)
GFR calc Af Amer: >60 mL/min (ref >60)
Glucose, Bld: 176 mg/dL — ABNORMAL HIGH (ref 65–99)
Potassium: 2.7 mmol/L — CL (ref 3.5–5.1)
Sodium: 131 mmol/L — ABNORMAL LOW (ref 135–145)
TOTAL PROTEIN: 5.5 g/dL — AB (ref 6.5–8.1)

## 2017-07-02 LAB — URINE CULTURE: CULTURE: NO GROWTH

## 2017-07-02 LAB — PROTIME-INR
INR: 1.27
Prothrombin Time: 15.8 seconds — ABNORMAL HIGH (ref 11.4–15.2)

## 2017-07-02 LAB — GLUCOSE, CAPILLARY
GLUCOSE-CAPILLARY: 199 mg/dL — AB (ref 65–99)
Glucose-Capillary: 260 mg/dL — ABNORMAL HIGH (ref 65–99)

## 2017-07-02 LAB — PHOSPHORUS: PHOSPHORUS: 2.3 mg/dL — AB (ref 2.5–4.6)

## 2017-07-02 MED ORDER — POTASSIUM PHOSPHATES 15 MMOLE/5ML IV SOLN
40.0000 meq | Freq: Once | INTRAVENOUS | Status: AC
  Administered 2017-07-02: 40 meq via INTRAVENOUS
  Filled 2017-07-02: qty 9.09

## 2017-07-02 MED ORDER — MAGNESIUM SULFATE 2 GM/50ML IV SOLN
2.0000 g | Freq: Once | INTRAVENOUS | Status: AC
  Administered 2017-07-02: 2 g via INTRAVENOUS
  Filled 2017-07-02: qty 50

## 2017-07-02 MED ORDER — POTASSIUM CHLORIDE CRYS ER 20 MEQ PO TBCR
40.0000 meq | EXTENDED_RELEASE_TABLET | Freq: Two times a day (BID) | ORAL | Status: AC
  Administered 2017-07-02 (×2): 40 meq via ORAL
  Filled 2017-07-02 (×2): qty 2

## 2017-07-02 MED ORDER — SODIUM CHLORIDE 0.9 % IV SOLN
2.0000 g | Freq: Once | INTRAVENOUS | Status: AC
  Administered 2017-07-02: 2 g via INTRAVENOUS
  Filled 2017-07-02: qty 20

## 2017-07-02 NOTE — Progress Notes (Addendum)
Family Medicine Teaching Service Daily Progress Note Intern Pager: [phone]  Patient name: [name] Medical record number: [id number] Date of birth: [birth year] Age: 62 y.o. Gender: female  Primary Care Provider: Nuala Alpha, DO Consultants: Hematology, Palliative care, CCM, ID  Code Status: Full   Pt Overview and Major Events to Date:  Admitted to [facility] on 06/26/17 for thromboyctopenia  Transferred to ICU on 06/27/17 Transferred back to [facility] on 06/29/17  Assessment and Plan: Dalayla Templetonis a 62 y.o.femalepresenting with thrombocytopenia. PMH is significant forStage IV adenocarcinoma of unknown primary versus NSCLC, recent PE with bilateral DVT (currently on Eliquis)bony metastasis throughout spine,ribs, sacrum, tobacco abuse, chronic pancreatitis s/p biliary stenting, MDD, anxiety, alcohol abuse, probable COPD  Acute on chronic hypoxemic respiratory failure  2/2 pulmonary edema. Patient with worsening respiratory status and needed to be placed on NRB @ 15L, satting @93 %. Patient keeps removing NRB mas, O2 sats drop to low 80s very quickly after removing mask. Lung exam showing diffuse crackles but apices are clear. Per Dr. Marin Olp, patient is developing ARDS. CXR from today showing no improvement.  -CCM consulted, appreciate recommendations -palliative care consulted, appreciate recommendations  -lasix 40mg  IV bid  -per Dr. Marin Olp, if patient's CXR worsens of O2 sats decrease can consider morphine infusion   Pseudomonal Bacteremia with septic shock Blood cultures growing pseudomonas auriginosa. Currentlyafebrile. WBC4.7. S/pvanc/zosyn(3/10-3/11)for broad spectrum coverage -continue cefepime (3/11-),per ID will not need double coverage with gentamicin. Recommend continuing cefepime till 3/19 -continue stress dose steroids   Thrombocytopeniawith anemia Plateletsof 33.Hgb of 9.5. Likely multifactorial with recent initiation of chemotherapy along with  likely bone marrow infiltration by malignancy. Per hematology pseudomonas infections can also destroy platelets. Blood smearshowing immature granulocytes.Patient currently having nose and gum bleeding. INR of 1.27.  -hematology consulted, appreciate recommendations -monitor on daily CBC -transfuse per hematology recommendations  -discontinue Eliquis  -Afrin nasal spray for nose bleeds  Loose stools  Patient reporting multiple loose bowel movements. C diff panel negative -will discontinue bowel regimen  -continue to monitor  Recent PE with bilateralDVT Stable.Recently admitted in February for PE in RLL involving segmental and more peripehral branches and found to have bilateral DVTs. Hypercoagulable given h/o malignancy. Patient was treated with Eliquis and was instructed to continue Eliquis5mg  bidx 3 months. -monitor for s/sx of PE -discontinue 1/2 dose Eliquis, can consider restarting on 3/15  Stage IV Right Lung Cancer Adenocarcinoma of unknown primary vsNSCLC with systemic bony metastatic disease. S/p 1 mo of radiation therapy(last ttx 2/21). She has also had a Port-A-Cath placed on right chest 2/20. Possible abdominal mets seen on CT abdomen on 2/13. No mets seen on brain MR 2/17. Receives 50-75 mcg of fentanyl via 72-hour patch and 20 oxycodone every12hours as needed, dilaudid 2mg  q4hrs prn.Chemotherapy initiated on 06/21/17 -Continue homepain regimen -Continue with outpatient management -Bowel regimen Colace and MiraLAXheld for loose stools -Wound care per nursing for Port-A-Cath incision -palliative care consultation, appreciate recommendations   Hypokalemia K of2.7. Mg of 1.3 -repleted 69mEq Kphos -repleted Mg with 4g IV Mg  Hypocalcemia Ca of5.6with albumin of1.8.Corrected Ca7.4.  -repleted with Ca gluconate 2g  Hyponatremia Na of131, 126 on admission. Likely 2/2 volume overload  -continue diureses as described above -fluid restricted diet    Hypophosphatemia Phosphorus of2.3 -repleted with KPhos   Systemic weakness, worsening Likely due to deconditioning in the setting of metastatic disease. -PT/OT -Nutrition consult  Chronic pancreatitis s/p biliary stenting, stable Likely due to alcohol abuse given patient's history per the chart. Patient has a biliary  stent on 11/30. Has been endorsing abdominal pain however abdominal exam is unremarkable. Recent imaging on 2/13 CT abdominal pelvis did not show any acute findings consistent with pancreatitis.  -continue to monitor abdominal exam  Adjustment disorder with anxious features Acute.She did not have this before and it has been less than 3 months of symptoms according to the patient.Home meds: ativa 1mg  tid prn, mirtazapine 7.5mg  daily, -continue home meds  Tobacco abuse -Nicotine replacement patches as needed -Nursing to provide smoking cessation counseling -encouraged smoking cessation given supplemental O2 use at home, patient reports that she no longer smokes since starting oxygen -Patient continues to smoke while using supplemental O2 at home, will need aggressive counseling on dangers of smoking with O2 use  Wound Patient describes vesicular rash on buttock. Vesicles have all been popped on exam.  -wound care consulted, appreciate recommendations  Neurogenic Bladder Patient reports no urine output and no output charted. Abdomen distended and tender.Possibly 2/2 to increased pain medication -maintainfoley catheter  Protein Calorie Malnutrition  Albumin of1.8 -nutrition consulted, appreciate recommendations  FEN/GI:regular diet, protonix 40mg  daily Prophylaxis:SCDs  Disposition: continued inpatient stay   Subjective:  Patient today reports no complaints. States she is wanting Afrin for the nose bleeds. Patient reports breathing is doing fine. Patient keeps removing NRB mask and O2 sats drop to low 80s quickly.   Objective: Temp:   [97.6 F (36.4 C)-99.8 F (37.7 C)] 98.3 F (36.8 C) (03/15 0746) Pulse Rate:  [93-114] 95 (03/15 1000) Resp:  [13-26] 17 (03/15 1000) BP: (104-153)/(49-80) 150/79 (03/15 1000) SpO2:  [93 %-98 %] 95 % (03/15 0800) Weight:  [158 lb 8.2 oz (71.9 kg)] 158 lb 8.2 oz (71.9 kg) (03/15 0530) Physical Exam: General: awake and alert, NAD, oriented to person and place, knew year but not month, NRB mask in place but keeps removing mask to drink water  Cardiovascular: RRR, no MRG Respiratory: apices clear, crackles in middle lobes and bases bilaterally, able to speak full sentences Abdomen: soft, non tender, non distended, bowel sounds normal  Extremities: non tender, 3+ pedal edema   Laboratory: Recent Labs  Lab 07/01/17 0509 07/01/17 2019 07/02/17 0507  WBC 3.8* 3.8* 4.7  HGB 10.3* 9.6* 9.5*  HCT 29.9* 28.5* 27.9*  PLT 21* 45* 33*   Recent Labs  Lab 07/01/17 0509 07/01/17 2019 07/02/17 0507  NA 129* 130* 131*  K 3.5 2.3* 2.7*  CL 88* 85* 86*  CO2 28 30 32  BUN 9 13 11   CREATININE 0.71 0.83 0.68  CALCIUM 5.6* 5.7* 5.6*  PROT 5.5* 6.0* 5.5*  BILITOT 0.8 0.6 0.7  ALKPHOS 159* 160* 163*  ALT 30 30 28   AST 30 33 29  GLUCOSE 202* 307* 176*    Imaging/Diagnostic Tests: Dg Chest 2 View  Result Date: 06/27/2017 CLINICAL DATA:  Fever. EXAM: CHEST - 2 VIEW COMPARISON:  Chest radiographs and CTA 06/13/2017 FINDINGS: Right jugular Port-A-Cath terminates over the lower SVC. The cardiac silhouette is normal in size. Aortic atherosclerosis is noted. There is a moderate-sized hiatal hernia. The lungs are hyperinflated with underlying emphysema. Peribronchial thickening and coarsened interstitial markings bilaterally are mildly increased compared to the prior radiographs with minimal patchy density in the left perihilar and right lower lungs. Trace pleural effusions are questioned. No pneumothorax is identified. IMPRESSION: COPD with mildly increased predominantly interstitial type densities  bilaterally which may reflect superimposed acute infection, possibly atypical or viral. Electronically Signed   By: Logan Bores M.D.   On: 06/27/2017 07:51  Dg Chest 2 View  Result Date: 06/13/2017 CLINICAL DATA:  Shortness of breath.  History of lung cancer. EXAM: CHEST  2 VIEW COMPARISON:  August 01, 2014 FINDINGS: No pneumothorax. The heart, hila, and mediastinum are unremarkable. Small hiatal hernia. The nodularity in the right upper lung on the recent PET-CT is not appreciated on this study. IMPRESSION: No cause for shortness of breath or chest pain identified. The no nodularity in the right apex is not well assessed with this study. Electronically Signed   By: Dorise Bullion III M.D   On: 06/13/2017 10:29   Ct Head Wo Contrast  Result Date: 06/27/2017 CLINICAL DATA:  History of lung carcinoma with confusion, initial encounter EXAM: CT HEAD WITHOUT CONTRAST TECHNIQUE: Contiguous axial images were obtained from the base of the skull through the vertex without intravenous contrast. COMPARISON:  06/05/2017 MRI, 03/02/2010 CT of the head FINDINGS: Brain: No evidence of acute infarction, hemorrhage, hydrocephalus, extra-axial collection or mass lesion/mass effect. Vascular: No hyperdense vessel or unexpected calcification. Skull: Changes of hemangiomas are noted similar to that seen on recent MRI. Sinuses/Orbits: Orbits are within normal limits. Mucosal thickening is noted within the maxillary antra bilaterally as well as within the ethmoid and sphenoid sinuses. Small air-fluid level is noted in the right maxillary antrum. Other: None IMPRESSION: Changes of sinusitis. No acute intracranial abnormality is noted. Electronically Signed   By: Inez Catalina M.D.   On: 06/27/2017 18:59   Ct Angio Chest Pe W And/or Wo Contrast  Result Date: 06/13/2017 CLINICAL DATA:  Shortness of breath.  Suspected PE. EXAM: CT ANGIOGRAPHY CHEST WITH CONTRAST TECHNIQUE: Multidetector CT imaging of the chest was performed using  the standard protocol during bolus administration of intravenous contrast. Multiplanar CT image reconstructions and MIPs were obtained to evaluate the vascular anatomy. CONTRAST:  52mL ISOVUE-370 IOPAMIDOL (ISOVUE-370) INJECTION 76% COMPARISON:  Chest CT May 25, 2017. Chest x-ray June 13, 2017 FINDINGS: Cardiovascular: The heart and coronary arteries are unchanged. Atherosclerotic changes are seen in the nonaneurysmal aorta. 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 No dissection. There is narrowing of right upper lobe pulmonary arterial branches due to soft tissue in the right hilum. Pulmonary emboli are seen in the right lung bases involving segmental and more peripheral branches. No other emboli identified. Mediastinum/Nodes: There is soft tissue in the right hilum which encases the right upper lobe bronchus and upper lobe pulmonary arterial branches. This soft tissue measures up to 2.8 cm. No other adenopathy. There is a moderate hiatal hernia. The thyroid is normal. A Port-A-Cath is identified. No effusions. Lungs/Pleura: The spiculated nodule in the right upper lobe measures 1.6 by 1.0 cm today versus 1.4 x 0.9 cm previously. The small difference could be due to difference in slice selection given the short interval. Scattered scarring. Stable 5 mm nodule in the right lung on series 6, image 77. Other nodules are stable as well. No new masses. Mild ground-glass in the medial right lung on series 6, image 102 is new and could be infectious. No new nodules. No other infiltrates. Upper Abdomen: Air in the liver is stable, likely from previous sphincterotomy. Prominence of the left adrenal gland is likely hyperplasia without nodularity. No other abnormalities in the upper abdomen. Musculoskeletal: Bony metastatic disease again identified in the spine, manubrium, sternum, and ribs. Review of the MIP images confirms the above findings. IMPRESSION: 1. Pulmonary emboli in the right lower lobe  involving segmental and more peripheral branches. 2. Mild ground-glass in the medial right  lung could be infectious. 3. The spiculated nodule in the right upper lobe is not significantly changed accounting for difference in technique. 4. Increasing soft tissue in the right hilum with encasement of the right upper lobe bronchus and right upper lobe pulmonary arterial branches resulting in some narrowing. This is suspicious for disease involvement. 5. Known bony metastatic disease. 6. Atherosclerotic changes in the nonaneurysmal aorta. 7. Moderate hiatal hernia. Findings called to and discussed with Dr. Ashok Cordia Aortic Atherosclerosis (ICD10-I70.0). Electronically Signed   By: Dorise Bullion III M.D   On: 06/13/2017 13:30   Mr Brain W Wo Contrast  Result Date: 06/06/2017 CLINICAL DATA:  Non-small cell lung cancer staging EXAM: MRI HEAD WITHOUT AND WITH CONTRAST TECHNIQUE: Multiplanar, multiecho pulse sequences of the brain and surrounding structures were obtained without and with intravenous contrast. CONTRAST:  80mL MULTIHANCE GADOBENATE DIMEGLUMINE 529 MG/ML IV SOLN COMPARISON:  Head CT 03/02/2010 FINDINGS: Brain: The midline structures are normal. There is no acute infarct or acute hemorrhage. No mass lesion, hydrocephalus, dural abnormality or extra-axial collection. The brain parenchymal signal is normal. No age-advanced or lobar predominant atrophy. No chronic microhemorrhage or superficial siderosis. Vascular: Major intracranial arterial and venous sinus flow voids are preserved. Skull and upper cervical spine: There are multiple T1 hyperintense foci within the calvarium, the largest in the right parietal bone. This also shows intrinsic T2 hyperintensity, likely a hemangioma. Sinuses/Orbits: Mild left sphenoid sinus mucosal thickening. No mastoid or middle ear effusion. Normal orbits. IMPRESSION: 1. No intracranial metastatic disease.  Normal brain. 2. Intrinsically T1/T2 hyperintense foci within the  calvarium are most consistent with hemangiomata. Electronically Signed   By: Ulyses Jarred M.D.   On: 06/06/2017 01:26   Ct Abdomen Pelvis W Contrast  Result Date: 06/02/2017 CLINICAL DATA:  Left upper quadrant abdominal pain and distention. Recently diagnosed with metastatic adenocarcinoma of unknown primary. Evaluate for diverticulitis. EXAM: CT ABDOMEN AND PELVIS WITH CONTRAST TECHNIQUE: Multidetector CT imaging of the abdomen and pelvis was performed using the standard protocol following bolus administration of intravenous contrast. CONTRAST:  167mL ISOVUE-300 IOPAMIDOL (ISOVUE-300) INJECTION 61% COMPARISON:  PET-CT 05/26/2017. Abdominopelvic CT 05/25/2017 and 03/25/2016. FINDINGS: Lower chest: Patchy ground-glass opacities at both lung bases are stable. There is no significant pleural effusion. Moderate-sized hiatal hernia again noted. Hepatobiliary: The liver is normal in density without focal abnormality. Stable mild biliary dilatation and pneumobilia status post cholecystectomy. Pancreas: Multiple calcifications are present within the pancreatic head. There is a pancreatic stent in place with a small amount of air in the main pancreatic duct. The pancreatic body and tail appear normal. No acute surrounding inflammation. Spleen: Scattered small granulomas.  Normal in size. Adrenals/Urinary Tract: Stable generalized enlargement of the left adrenal gland consistent with hyperplasia. The right adrenal gland appears normal. Tiny low-density left renal lesions remain too small to characterize, but stable. No hydronephrosis or urinary tract calculus. The bladder appears normal. Stomach/Bowel: As above, moderate-size hiatal hernia. The stomach and small bowel appear normal. The proximal colon is fluid-filled. There are diverticular changes of the sigmoid colon with a fluid-filled inferiorly projecting diverticulum on axial image 64. There may be minimal inflammation in the surrounding fat. No evidence of bowel  obstruction. Vascular/Lymphatic: There are no enlarged abdominal or pelvic lymph nodes. Scattered small retroperitoneal lymph nodes are stable. There is diffuse aortic and branch vessel atherosclerosis. No acute vascular findings. Reproductive: The uterus and ovaries appear normal. No adnexal mass. Probable pelvic floor laxity. Other: There is an enhancing soft tissue nodule in  the anterior abdominal wall near the umbilicus, measuring 16 mm on image 41. Mildly hypermetabolic on recent PET-CT, probably a metastasis. No ascites, free air or peritoneal nodularity. Musculoskeletal: Multifocal osseous metastatic disease, similar to recent studies. No pathologic fracture identified. IMPRESSION: 1. Possible mild sigmoid diverticulitis. No evidence of perforation, abscess or obstruction. 2. No other evidence of acute process. 3. Known metastatic disease to the bones. Probable enhancing metastasis within the anterior abdominal wall near the umbilicus. 4. Stable incidental findings including a moderate size hiatal hernia, mild biliary dilatation status post cholecystectomy, sequela of chronic calcific pancreatitis, left adrenal hyperplasia and Aortic Atherosclerosis (ICD10-I70.0). Electronically Signed   By: Richardean Sale M.D.   On: 06/02/2017 15:48   Ir US Guide Vasc Access Right  Result Date: 06/09/2017 CLINICAL DATA:  Lung cancer EXAM: TUNNEL POWER PORT PLACEMENT WITH SUBCUTANEOUS POCKET UTILIZING ULTRASOUND & FLOUROSCOPY FLUOROSCOPY TIME:  30 seconds.  Five mGy. MEDICATIONS AND MEDICAL HISTORY: Versed 4 mg, Fentanyl 100 mcg. Additional Medications: Ancef 2 g. Antibiotics were given within 2 hours of the procedure. ANESTHESIA/SEDATION: Moderate sedation time: 26 minutes. Nursing monitored the the patient during the procedure. PROCEDURE: After written informed consent was obtained, patient was placed in the supine position on angiographic table. The right neck and chest was prepped and draped in a sterile fashion.  Lidocaine was utilized for local anesthesia. The right jugular vein was noted to be patent initially with ultrasound. Under sonographic guidance, a micropuncture needle was inserted into the right IJ vein (Ultrasound and fluoroscopic image documentation was performed). The needle was removed over an 018 wire which was exchanged for a Amplatz. This was advanced into the IVC. An 8-French dilator was advanced over the Amplatz. A small incision was made in the right upper chest over the anterior right second rib. Utilizing blunt dissection, a subcutaneous pocket was created in the caudal direction. The pocket was irrigated with a copious amount of sterile normal saline. The port catheter was tunneled from the chest incision, and out the neck incision. The reservoir was inserted into the subcutaneous pocket and secured with two 3-0 Ethilon stitches. A peel-away sheath was advanced over the Amplatz wire. The port catheter was cut to measure length and inserted through the peel-away sheath. The peel-away sheath was removed. The chest incision was closed with 3-0 Vicryl interrupted stitches for the subcutaneous tissue and a running of 4-0 Vicryl subcuticular stitch for the skin. The neck incision was closed with a 4-0 Vicryl subcuticular stitch. Derma-bond was applied to both surgical incisions. The port reservoir was flushed and instilled with heparinized saline. No complications. FINDINGS: A right IJ vein Port-A-Cath is in place with its tip at the cavoatrial junction. COMPLICATIONS: None IMPRESSION: Successful 8 French right internal jugular vein power port placement with its tip at the SVC/RA junction. Electronically Signed   By: Marybelle Killings M.D.   On: 06/09/2017 16:41   Dg Chest Port 1 View  Result Date: 07/01/2017 CLINICAL DATA:  62 year old female with metastatic lung cancer. Shortness of breath and fever. Pulmonary emboli last month. EXAM: PORTABLE CHEST 1 VIEW COMPARISON:  06/30/2017 and earlier. FINDINGS:  Portable AP upright view at 0555 hours. Stable right chest porta cath, accessed. Diffuse reticulonodular pulmonary opacity has developed since February, is progressed since 06/28/2017, but stable since yesterday. No associated pneumothorax or pleural effusion. Stable cardiac size and mediastinal contours. Moderate hiatal hernia re-demonstrated. Negative visible bowel gas pattern. IMPRESSION: Progressed diffuse reticulonodular pulmonary opacity since 06/28/2017, stable since yesterday. Differential considerations in  this clinical setting include acute viral/atypical infection, lymphangitic carcinomatosis, and less likely acute interstitial edema. Electronically Signed   By: Genevie Ann M.D.   On: 07/01/2017 09:21   Dg Chest Port 1 View  Result Date: 06/30/2017 CLINICAL DATA:  Increasing shortness of breath, history of lung carcinoma EXAM: PORTABLE CHEST 1 VIEW COMPARISON:  06/28/2017 FINDINGS: Right-sided chest wall port is again noted and stable. Cardiac shadow is stable. Diffuse increased interstitial changes are identified increased from the prior exam consistent with pulmonary edema. No focal confluent infiltrate is seen. No sizable effusion is noted. No bony abnormality is seen. IMPRESSION: Increasing interstitial changes consistent with edema. Electronically Signed   By: Inez Catalina M.D.   On: 06/30/2017 08:13   Dg Chest Port 1 View  Result Date: 06/28/2017 CLINICAL DATA:  Septic shock.  History of lung carcinoma EXAM: PORTABLE CHEST 1 VIEW COMPARISON:  June 27, 2017 chest radiograph and chest CT June 13, 2017 FINDINGS: There is diffuse interstitial prominence consistent with either pulmonary edema or lymphangitic spread of tumor. There is atelectatic change in the left base with questionable airspace consolidation immediately adjacent to a hiatal hernia. Heart is upper normal in size. There is mild pulmonary venous hypertension. There is aortic atherosclerosis. There is no adenopathy apparent by  radiography. Central catheter tip is in the superior vena cava near the cavoatrial junction. No pneumothorax. Bony metastases are better appreciated on recent CT than on this study, although several ribs show evidence suggesting bony metastatic disease. Moderate hiatal hernia noted. There is aortic atherosclerosis. IMPRESSION: Interstitial prominence. Question a degree of pulmonary edema versus lymphangitic spread of tumor. Both entities may exist concurrently. Note that there is pulmonary venous hypertension. Question atelectasis versus early pneumonia left base. Moderate hiatal hernia. Stable cardiac silhouette. Bony metastatic disease, better seen on recent CT. There is aortic atherosclerosis. Aortic Atherosclerosis (ICD10-I70.0). Electronically Signed   By: Lowella Grip III M.D.   On: 06/28/2017 07:55   Dg Chest Port 1v Same Day  Result Date: 07/02/2017 CLINICAL DATA:  Follow-up bilateral infiltrates EXAM: PORTABLE CHEST 1 VIEW COMPARISON:  07/01/2017 FINDINGS: Cardiac shadow is stable. Right chest wall port is again noted. Hiatal hernia is seen and stable. Diffuse bilateral opacities are again seen and stable. No new focal abnormality is noted. IMPRESSION: No change from the prior exam. Electronically Signed   By: Inez Catalina M.D.   On: 07/02/2017 09:03   Mm Screening Breast Tomo Bilateral  Result Date: 06/10/2017 CLINICAL DATA:  Screening. EXAM: DIGITAL SCREENING BILATERAL MAMMOGRAM WITH TOMO AND CAD COMPARISON:  Previous exam(s). ACR Breast Density Category c: The breast tissue is heterogeneously dense, which may obscure small masses. FINDINGS: There are no findings suspicious for malignancy. Images were processed with CAD. IMPRESSION: No mammographic evidence of malignancy. A result letter of this screening mammogram will be mailed directly to the patient. RECOMMENDATION: Screening mammogram in one year. (Code:[id number]) BI-RADS CATEGORY  1: Negative. Electronically Signed   By: Lajean Manes  M.D.   On: 06/10/2017 09:40   Ir Fluoro Guide Port Insertion Right  Result Date: 06/09/2017 CLINICAL DATA:  Lung cancer EXAM: TUNNEL POWER PORT PLACEMENT WITH SUBCUTANEOUS POCKET UTILIZING ULTRASOUND & FLOUROSCOPY FLUOROSCOPY TIME:  30 seconds.  Five mGy. MEDICATIONS AND MEDICAL HISTORY: Versed 4 mg, Fentanyl 100 mcg. Additional Medications: Ancef 2 g. Antibiotics were given within 2 hours of the procedure. ANESTHESIA/SEDATION: Moderate sedation time: 26 minutes. Nursing monitored the the patient during the procedure. PROCEDURE: After written informed consent was obtained,  patient was placed in the supine position on angiographic table. The right neck and chest was prepped and draped in a sterile fashion. Lidocaine was utilized for local anesthesia. The right jugular vein was noted to be patent initially with ultrasound. Under sonographic guidance, a micropuncture needle was inserted into the right IJ vein (Ultrasound and fluoroscopic image documentation was performed). The needle was removed over an 018 wire which was exchanged for a Amplatz. This was advanced into the IVC. An 8-French dilator was advanced over the Amplatz. A small incision was made in the right upper chest over the anterior right second rib. Utilizing blunt dissection, a subcutaneous pocket was created in the caudal direction. The pocket was irrigated with a copious amount of sterile normal saline. The port catheter was tunneled from the chest incision, and out the neck incision. The reservoir was inserted into the subcutaneous pocket and secured with two 3-0 Ethilon stitches. A peel-away sheath was advanced over the Amplatz wire. The port catheter was cut to measure length and inserted through the peel-away sheath. The peel-away sheath was removed. The chest incision was closed with 3-0 Vicryl interrupted stitches for the subcutaneous tissue and a running of 4-0 Vicryl subcuticular stitch for the skin. The neck incision was closed with a 4-0  Vicryl subcuticular stitch. Derma-bond was applied to both surgical incisions. The port reservoir was flushed and instilled with heparinized saline. No complications. FINDINGS: A right IJ vein Port-A-Cath is in place with its tip at the cavoatrial junction. COMPLICATIONS: None IMPRESSION: Successful 8 French right internal jugular vein power port placement with its tip at the SVC/RA junction. Electronically Signed   By: Marybelle Killings M.D.   On: 06/09/2017 16:41     Caroline More, DO 07/02/2017, 12:08 PM PGY-1, [place] Intern pager: [phone], text pages welcome

## 2017-07-02 NOTE — Progress Notes (Signed)
Report called to unit 6N, RN Remo Lipps. Pt to transfer to med-surg bed, room 6N32.

## 2017-07-02 NOTE — Progress Notes (Signed)
Pharmacy Antibiotic Note  Petra Dumler is a 62 y.o. female admitted on 06/26/2017 with sepsis.  Pharmacy initially consulted for vancomycin and Zosyn dosing.  Blood cultures now growing pseudomonas which is sensitive to cefepime.    Initially 1 blood culture reported as gram positive - called lab, this was erroneous - actually also growing GNR.  Tmax 102.2 on admission, now afebrile.   Plan: Cefepime 2 g IV q 8 hrs - per ID duration of abx depends on pt progress. Monitor cultures, s/sx, and LOT.  Height: 5\' 6"  (167.6 cm) Weight: 158 lb 8.2 oz (71.9 kg) IBW/kg (Calculated) : 59.3  Temp (24hrs), Avg:98.4 F (36.9 C), Min:97.7 F (36.5 C), Max:99.8 F (37.7 C)  Recent Labs  Lab 06/27/17 0607 06/27/17 1239  06/27/17 1556  06/29/17 1751 06/30/17 0327 06/30/17 1300 06/30/17 2100 07/01/17 0509 07/01/17 2019 07/02/17 0507  WBC  --   --    < >  --    < > 5.5 4.1  --   --  3.8* 3.8* 4.7  CREATININE  --  0.77  --   --    < > 0.69 0.61 0.71 0.73 0.71 0.83 0.68  LATICACIDVEN 1.7 3.5*  --  2.8*  --   --   --   --   --   --   --   --    < > = values in this interval not displayed.    Estimated Creatinine Clearance: 74 mL/min (by C-G formula based on SCr of 0.68 mg/dL).    Allergies  Allergen Reactions  . Ciprofloxacin Nausea Only    Antimicrobials this admission:  Vancomycin 3/10 >> 3/11 Cefepime 3/11 >  Zosyn 3/10 >>3/11  Dose adjustments this admission:   Microbiology results:  3/10 BCx: 2/2 pseudomonas- sensitivities pending- called micro - pan S except cefazolin (sens to cefepime) (BCID w/ pseudomonas) 3/10 MRSA PCR: neg 3/10 UCx: neg  Thank you for allowing pharmacy to be a part of this patient's care.  Uvaldo Rising, BCPS  Clinical Pharmacist Pager [phone]  07/02/2017 1:25 PM

## 2017-07-02 NOTE — Progress Notes (Signed)
CRITICAL VALUE ALERT  Critical Value: K 2.7 & Ca 5.6   Date & Time Notied:  07/02/17 0615  Provider Notified: Dr. Criss Rosales  Orders Received/Actions taken: MD will order replacements

## 2017-07-02 NOTE — Progress Notes (Signed)
Pt maintaining O2 sats in 90's on HFNC 15L, since placed on at 1300 to eat lunch.  Son has been at bedside since about 1315, pt has remained very alert, talkative and able to maintain sats.   Pt's significant other also at bedside with pt and son.

## 2017-07-02 NOTE — Care Management Note (Signed)
Case Management Note Marvetta Gibbons RN, BSN Unit 4E-Case Manager-- [place] coverage [phone]   Patient Details  Name: Daritza Brees MRN: [id number] Date of Birth: [birth year]  Subjective/Objective:    Pt admitted with thrombocytopenia, acute on chronic respiratory failure with worsening resp. Status. Pseudomonal Bacteremia with septic shock.  hx of stage IV adenocarcinoma lung CA                  Action/Plan: PTA pt lived at home with boyfriend, recent chemo tx for lung CA.- has home 02 baseline. Noted orders placed for HHPT/OT- however per MD notes pt may be moving toward comfort care as her respiratory status is worsening. CM will hold off at this time in arranging of any [facility] services- and see how pt progresses. CM will continue to follow for transition of care needs.   Expected Discharge Date:                  Expected Discharge Plan:  [place]  In-House Referral:     Discharge planning Services  CM Consult  Post Acute Care Choice:  Home Health Choice offered to:  Patient  DME Arranged:    DME Agency:     HH Arranged:  PT, OT HH Agency:     Status of Service:  In process, will continue to follow  If discussed at Long Length of Stay Meetings, dates discussed:    Discharge Disposition:   Additional Comments:  Dawayne Patricia, RN 07/02/2017, 12:00 PM

## 2017-07-02 NOTE — Progress Notes (Signed)
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  [name] chest x-ray yesterday showed an interstitial pneumonitis pattern.  I would not think that this is lymphangitic spread of [name] cancer.  I think that given the fact that [name] has Pseudomonas, I suspect that [name] probably is developing ARDS from this.  We will have to see what [name] chest x-ray today shows.  [name] did get some platelets yesterday.  [name] platelet count is 33,000 this morning.  [name] potassium is only 2.7.  [name] calcium is 5.6.  [name] magnesium is 1.3.  [name] primary service is managing this.  [name] white cell count is 4.7.  Hemoglobin is 9.5.  I had a talk with [name] boyfriend and with [name].  I gave them my concerns as to what is developing.  I have a bad feeling that there is a very good chance that [name] is not going to get better.  [name] has had so much go against [name].  I really think that we will no what direction [name] is headed in the next 24-48 hours.  If [name] chest x-ray worsens, and [name] oxygen saturation levels decreased, I think we need to put [name] on a morphine infusion and let [name] pass peacefully.  [name] is already a DNR.  [name] is in no condition to take any further therapy for [name] malignancy.  I just feel so bad for [name].  From the start of this whole cancer journey, [name] is just had so much go against [name].  I just want [name] to have respect, dignity, and comfort.  Again, if the chest x-ray worsens, and [name] oxygen saturations decline, I think we have to do the right thing for [name] and get [name] on a morphine infusion so that [name] will pass peacefully on to.[place]  I showed [name] boyfriend the chest x-rays and the changes that have happened over the past 4 days.  When [name] takes off [name] facemask, [name] oxygen saturation goes down to 76%.  This happens within a minute.  I think this is incredibly indicative of the problem that [name] will have.  Again, at this point, I think we need to be  looking at comfort as our top priority for [name].  We will see what the x-ray shows today.  [name] will continue on the IV antibiotics.  [name] Haw, MD  2 Timothy 4:16-18

## 2017-07-02 NOTE — Progress Notes (Signed)
INTERIM PROGRESS  Called with critical labs hypocalcemia/hypocalemia  Ordered 2G calcium gluconate and 10mEq Kph[place]  -Dr. Criss Rosales

## 2017-07-02 NOTE — Progress Notes (Signed)
PT Cancellation Note  Patient Details Name: [name] MRN: [id number] DOB: 07/11/1955   Cancelled Treatment:    Reason Eval/Treat Not Completed: Medical issues which prohibited therapy. Remains on non-rebreather mask. Will follow-up for medical appropriateness on Monday.  [name], PT, DPT Acute Rehab Services  Pager: [phone] 07/02/2017, 11:35 AM

## 2017-07-02 NOTE — Progress Notes (Signed)
Pt's son arrived today at this time, pt perked up when he arrived.

## 2017-07-02 NOTE — Progress Notes (Signed)
Inpatient Diabetes Program Recommendations  AACE/ADA: New Consensus Statement on Inpatient Glycemic Control (2015)  Target Ranges:  Prepandial:   less than 140 mg/dL      Peak postprandial:   less than 180 mg/dL (1-2 hours)      Critically ill patients:  140 - 180 mg/dL   Results for CLOMA, RAHRIG (MRN [id number]) as of 07/02/2017 10:56  Ref. Range 07/01/2017 10:07 07/01/2017 12:28 07/01/2017 17:15 07/01/2017 21:49 07/02/2017 07:49  Glucose-Capillary Latest Ref Range: 65 - 99 mg/dL 228 (H) 224 (H) 259 (H) 306 (H) 199 (H)   Review of Glycemic Control  Diabetes history: No Outpatient Diabetes medications: NA Current orders for Inpatient glycemic control: Novolog 0-15 units TID with meals, Novolog 0-5 units QHS; Solucortef 50 mg Q6H  Inpatient Diabetes Program Recommendations: Insulin - Basal: If steroids are continued, please consider ordering Lantus 7 units Q24H (based on 71 kg x 0.1 units).  Thanks, Barnie Alderman, RN, MSN, CDE Diabetes Coordinator Inpatient Diabetes Program [phone] (Team Pager from 8am to 5pm)

## 2017-07-02 NOTE — Progress Notes (Signed)
[facility] for Infectious Disease   Reason for visit: Follow up on Pseudomonas bacteremia  Interval History: Tmax 99.8, WBC 4.7, no associated rash.  Wanting to eat this am.  CXR personally reviewed and confirm a reticular pattern, about the same as yesterday, maybe a slight improvement.   Cefepime day 5 Total antibiotics day 6  Physical Exam: Constitutional:  Vitals:   07/02/17 0746 07/02/17 0800  BP:  (!) 133/54  Pulse:  (!) 105  Resp:  16  Temp: 98.3 F (36.8 C)   SpO2:  95%   patient appears in no acute distress Eyes: anicteric HENT: on facemask Respiratory: increased respiratory effort; CTA B Cardiovascular: tachy RR  Review of Systems: Pulmonary: no significant cough Integument/breast: negative for rash  Lab Results  Component Value Date   WBC 4.7 07/02/2017   HGB 9.5 (L) 07/02/2017   HCT 27.9 (L) 07/02/2017   MCV 82.5 07/02/2017   PLT 33 (L) 07/02/2017    Lab Results  Component Value Date   CREATININE 0.68 07/02/2017   BUN 11 07/02/2017   NA 131 (L) 07/02/2017   K 2.7 (LL) 07/02/2017   CL 86 (L) 07/02/2017   CO2 32 07/02/2017    Lab Results  Component Value Date   ALT 28 07/02/2017   AST 29 07/02/2017   ALKPHOS 163 (H) 07/02/2017     Microbiology: Recent Results (from the past 240 hour(s))  Culture, blood (Routine X 2) w Reflex to ID Panel     Status: Abnormal   Collection Time: 06/27/17  6:07 AM  Result Value Ref Range Status   Specimen Description BLOOD LEFT HAND  Final   Special Requests IN PEDIATRIC BOTTLE Blood Culture adequate volume  Final   Culture  Setup Time   Final    GRAM NEGATIVE RODS IN PEDIATRIC BOTTLE CRITICAL RESULT CALLED TO, READ BACK BY AND VERIFIED WITH: A.MASTERS,PHARMD AT 0277 ON 06/28/17 BY G.MCADOO Performed at [facility], [place] [address] [place], [place] [address]    Culture PSEUDOMONAS AERUGINOSA (A)  Final   Report Status 06/30/2017 FINAL  Final   Organism ID, Bacteria PSEUDOMONAS AERUGINOSA  Final       Susceptibility   Pseudomonas aeruginosa - MIC*    CEFTAZIDIME 4 SENSITIVE Sensitive     CIPROFLOXACIN <=0.25 SENSITIVE Sensitive     GENTAMICIN <=1 SENSITIVE Sensitive     IMIPENEM 1 SENSITIVE Sensitive     PIP/TAZO 8 SENSITIVE Sensitive     CEFEPIME <=1 SENSITIVE Sensitive     * PSEUDOMONAS AERUGINOSA  Blood Culture ID Panel (Reflexed)     Status: Abnormal   Collection Time: 06/27/17  6:07 AM  Result Value Ref Range Status   Enterococcus species NOT DETECTED NOT DETECTED Final   Listeria monocytogenes NOT DETECTED NOT DETECTED Final   Staphylococcus species NOT DETECTED NOT DETECTED Final   Staphylococcus aureus NOT DETECTED NOT DETECTED Final   Streptococcus species NOT DETECTED NOT DETECTED Final   Streptococcus agalactiae NOT DETECTED NOT DETECTED Final   Streptococcus pneumoniae NOT DETECTED NOT DETECTED Final   Streptococcus pyogenes NOT DETECTED NOT DETECTED Final   Acinetobacter baumannii NOT DETECTED NOT DETECTED Final   Enterobacteriaceae species NOT DETECTED NOT DETECTED Final   Enterobacter cloacae complex NOT DETECTED NOT DETECTED Final   Escherichia coli NOT DETECTED NOT DETECTED Final   Klebsiella oxytoca NOT DETECTED NOT DETECTED Final   Klebsiella pneumoniae NOT DETECTED NOT DETECTED Final   Proteus species NOT DETECTED NOT DETECTED  Final   Serratia marcescens NOT DETECTED NOT DETECTED Final   Carbapenem resistance NOT DETECTED NOT DETECTED Final   Haemophilus influenzae NOT DETECTED NOT DETECTED Final   Neisseria meningitidis NOT DETECTED NOT DETECTED Final   Pseudomonas aeruginosa DETECTED (A) NOT DETECTED Final    Comment: CRITICAL RESULT CALLED TO, READ BACK BY AND VERIFIED WITH: A.MASTERS,PHARMD AT 9518 ON 06/28/17 BY G.MCADOO    Candida albicans NOT DETECTED NOT DETECTED Final   Candida glabrata NOT DETECTED NOT DETECTED Final   Candida krusei NOT DETECTED NOT DETECTED Final   Candida parapsilosis NOT DETECTED NOT DETECTED Final   Candida tropicalis  NOT DETECTED NOT DETECTED Final    Comment: Performed at [facility], [place] [address], [place], [place] [address]  Culture, blood (Routine X 2) w Reflex to ID Panel     Status: Abnormal   Collection Time: 06/27/17  6:12 AM  Result Value Ref Range Status   Specimen Description BLOOD LEFT HAND  Final   Special Requests IN PEDIATRIC BOTTLE Blood Culture adequate volume  Final   Culture  Setup Time   Final    GRAM NEGATIVE RODS IN PEDIATRIC BOTTLE CRITICAL VALUE NOTED.  VALUE IS CONSISTENT WITH PREVIOUSLY REPORTED AND CALLED VALUE.    Culture (A)  Final    PSEUDOMONAS AERUGINOSA SUSCEPTIBILITIES PERFORMED ON PREVIOUS CULTURE WITHIN THE LAST 5 DAYS. Performed at [facility], [place] [address], [place], [place] [address]    Report Status 06/30/2017 FINAL  Final  MRSA PCR Screening     Status: None   Collection Time: 06/27/17  5:40 PM  Result Value Ref Range Status   MRSA by PCR NEGATIVE NEGATIVE Final    Comment:        The GeneXpert MRSA Assay (FDA approved for NASAL specimens only), is one component of a comprehensive MRSA colonization surveillance program. It is not intended to diagnose MRSA infection nor to guide or monitor treatment for MRSA infections. Performed at [facility], [place] [address] [place], [place] [address]   Culture, Urine     Status: None   Collection Time: 06/27/17 10:58 PM  Result Value Ref Range Status   Specimen Description URINE, RANDOM  Final   Special Requests NONE  Final   Culture   Final    NO GROWTH Performed at [facility], [place] [address] [place], [place] [address]    Report Status 06/29/2017 FINAL  Final  C difficile quick scan w PCR reflex     Status: None   Collection Time: 06/29/17  9:20 PM  Result Value Ref Range Status   C Diff antigen NEGATIVE NEGATIVE Final   C Diff toxin NEGATIVE NEGATIVE Final   C Diff interpretation No C. difficile detected.  Final    Comment: Performed at [facility], [place]  [address], [place], [place] [address]  Culture, Urine     Status: None   Collection Time: 07/01/17  1:38 AM  Result Value Ref Range Status   Specimen Description URINE, CATHETERIZED  Final   Special Requests NONE  Final   Culture   Final    NO GROWTH Performed at [facility], [address] [place], [place] [address]    Report Status 07/02/2017 FINAL  Final    Impression/Plan:  1. Pseudomonas bacteremia - on cefepime and will continue.   2.  Thrombocytopenia - continues to remain low.  33 today.      3.  Reticulonodular opacities on CXR - stable by my read.  Breathing more comfortable today.    Dr. Tommy Medal available over the weekend if needed, otherwise Dr. Megan Salon will follow up on Monday.

## 2017-07-02 NOTE — Progress Notes (Addendum)
0130 Pt sleeping, easy to arouse. NAD, WCTM. Significant other sleeping in recliner chair.   0510 Pt medicated for pain and anxiety, pt woke up, confused, reoriented. Keeps taking off NRB mask for sips of water. Pt quickly desaturates, RN to room to assist pt with mask, slow to recover O2 sats. WCTM.

## 2017-07-02 NOTE — Progress Notes (Addendum)
Pt having pain "all over" requesting pain med, next oxycodone due at 1720, IV dilaudid not indicated per order parameters so pt given tylenol po. Pt anxious about transferring to another unit, pt given ativan po for anxiety.   Pt now ready to transport to 6N32. Transported by RN and NT via bed with O2 HFNC 15L with belongings.

## 2017-07-03 DIAGNOSIS — R079 Chest pain, unspecified: Secondary | ICD-10-CM

## 2017-07-03 LAB — COMPREHENSIVE METABOLIC PANEL
ALBUMIN: 1.7 g/dL — AB (ref 3.5–5.0)
ALK PHOS: 169 U/L — AB (ref 38–126)
ALT: 27 U/L (ref 14–54)
ANION GAP: 13 (ref 5–15)
AST: 34 U/L (ref 15–41)
BILIRUBIN TOTAL: 0.6 mg/dL (ref 0.3–1.2)
BUN: 13 mg/dL (ref 6–20)
CALCIUM: 5.4 mg/dL — AB (ref 8.9–10.3)
CO2: 34 mmol/L — ABNORMAL HIGH (ref 22–32)
Chloride: 84 mmol/L — ABNORMAL LOW (ref 101–111)
Creatinine, Ser: 0.66 mg/dL (ref 0.44–1.00)
GFR calc Af Amer: >60 mL/min (ref >60)
GFR calc non Af Amer: >60 mL/min (ref >60)
GLUCOSE: 244 mg/dL — AB (ref 65–99)
Potassium: 2.5 mmol/L — CL (ref 3.5–5.1)
Sodium: 131 mmol/L — ABNORMAL LOW (ref 135–145)
TOTAL PROTEIN: 5.3 g/dL — AB (ref 6.5–8.1)

## 2017-07-03 LAB — PHOSPHORUS: Phosphorus: 1.6 mg/dL — ABNORMAL LOW (ref 2.5–4.6)

## 2017-07-03 LAB — CBC
HEMATOCRIT: 28.1 % — AB (ref 36.0–46.0)
HEMOGLOBIN: 9.3 g/dL — AB (ref 12.0–15.0)
MCH: 27.8 pg (ref 26.0–34.0)
MCHC: 33.1 g/dL (ref 30.0–36.0)
MCV: 83.9 fL (ref 78.0–100.0)
Platelets: 22 10*3/uL — CL (ref 150–400)
RBC: 3.35 MIL/uL — ABNORMAL LOW (ref 3.87–5.11)
RDW: 17 % — ABNORMAL HIGH (ref 11.5–15.5)
WBC: 5.3 10*3/uL (ref 4.0–10.5)

## 2017-07-03 LAB — GLUCOSE, RANDOM: Glucose, Bld: 458 mg/dL — ABNORMAL HIGH (ref 65–99)

## 2017-07-03 LAB — MAGNESIUM: MAGNESIUM: 1.4 mg/dL — AB (ref 1.7–2.4)

## 2017-07-03 LAB — GLUCOSE, CAPILLARY
GLUCOSE-CAPILLARY: 218 mg/dL — AB (ref 65–99)
Glucose-Capillary: 431 mg/dL — ABNORMAL HIGH (ref 65–99)

## 2017-07-03 MED ORDER — POTASSIUM CHLORIDE CRYS ER 20 MEQ PO TBCR
40.0000 meq | EXTENDED_RELEASE_TABLET | ORAL | Status: AC
  Administered 2017-07-03 (×2): 40 meq via ORAL
  Filled 2017-07-03 (×3): qty 2

## 2017-07-03 MED ORDER — INSULIN ASPART 100 UNIT/ML ~~LOC~~ SOLN
15.0000 [IU] | Freq: Once | SUBCUTANEOUS | Status: AC
  Administered 2017-07-03: 15 [IU] via SUBCUTANEOUS

## 2017-07-03 MED ORDER — HYDROCORTISONE NA SUCCINATE PF 100 MG IJ SOLR: 50.0000 mg | Freq: Two times a day (BID) | INTRAMUSCULAR | Status: DC

## 2017-07-03 MED ORDER — SODIUM CHLORIDE 0.9 % IV SOLN
2.0000 g | Freq: Once | INTRAVENOUS | Status: AC
  Administered 2017-07-03: 2 g via INTRAVENOUS
  Filled 2017-07-03: qty 20

## 2017-07-03 MED ORDER — MAGNESIUM SULFATE IN D5W 1-5 GM/100ML-% IV SOLN
1.0000 g | Freq: Once | INTRAVENOUS | Status: AC
  Administered 2017-07-03: 1 g via INTRAVENOUS
  Filled 2017-07-03: qty 100

## 2017-07-03 MED ORDER — HYDROMORPHONE HCL 1 MG/ML IJ SOLN
3.0000 mg | INTRAMUSCULAR | Status: DC | PRN
  Administered 2017-07-03 – 2017-07-04 (×6): 3 mg via INTRAVENOUS
  Filled 2017-07-03 (×6): qty 3

## 2017-07-03 MED ORDER — GABAPENTIN 600 MG PO TABS
300.0000 mg | ORAL_TABLET | Freq: Three times a day (TID) | ORAL | Status: DC
  Administered 2017-07-03 – 2017-07-05 (×4): 300 mg via ORAL
  Filled 2017-07-03 (×6): qty 1

## 2017-07-03 MED ORDER — HEPARIN SOD (PORK) LOCK FLUSH 100 UNIT/ML IV SOLN
500.0000 [IU] | INTRAVENOUS | Status: AC | PRN
  Administered 2017-07-03: 500 [IU]

## 2017-07-03 MED ORDER — FUROSEMIDE 10 MG/ML IJ SOLN
40.0000 mg | Freq: Every day | INTRAMUSCULAR | Status: DC
  Administered 2017-07-05: 40 mg via INTRAVENOUS
  Filled 2017-07-03 (×2): qty 4

## 2017-07-03 MED ORDER — POTASSIUM PHOSPHATES 15 MMOLE/5ML IV SOLN
40.0000 meq | Freq: Once | INTRAVENOUS | Status: AC
  Administered 2017-07-03: 40 meq via INTRAVENOUS
  Filled 2017-07-03: qty 9.09

## 2017-07-03 MED ORDER — POTASSIUM CHLORIDE CRYS ER 20 MEQ PO TBCR
40.0000 meq | EXTENDED_RELEASE_TABLET | Freq: Once | ORAL | Status: AC
  Administered 2017-07-03: 40 meq via ORAL
  Filled 2017-07-03: qty 2

## 2017-07-03 MED ORDER — POTASSIUM CHLORIDE 10 MEQ/50ML IV SOLN
10.0000 meq | INTRAVENOUS | Status: DC
  Administered 2017-07-03 (×2): 10 meq via INTRAVENOUS
  Filled 2017-07-03 (×6): qty 50

## 2017-07-03 NOTE — Progress Notes (Signed)
Family Medicine Teaching Service Daily Progress Note Intern Pager: [phone]  Patient name: [name] Medical record number: [id number] Date of birth: 1956/03/21 Age: 62 y.o. Gender: female  Primary Care Provider: Nuala Alpha, DO Consultants: Hematology, Palliative care, CCM, ID  Code Status: Full   Pt Overview and Major Events to Date:  Admitted to [facility] on 06/26/17 for thromboyctopenia  Transferred to ICU on 06/27/17 Transferred back to [facility] on 06/29/17  Assessment and Plan: Carisa Templetonis a 62 y.o.femalepresenting with thrombocytopenia. PMH is significant forStage IV adenocarcinoma of unknown primary versus NSCLC, recent PE with bilateral DVT (currently on Eliquis)bony metastasis throughout spine,ribs, sacrum, tobacco abuse, chronic pancreatitis s/p biliary stenting, MDD, anxiety, alcohol abuse, probable COPD  Acute on chronic hypoxemic respiratory failure  2/2 pulmonary edema superimposed on likely lung cancer. Some concern for ARDS.  -palliative care consulted, appreciate recommendations  -lasix 40mg  IV QD -per Dr. Marin Olp, if patient's CXR worsens of O2 sats decrease can consider morphine infusion   Pseudomonal Bacteremia with septic shock Blood cultures growing pseudomonas auriginosa. Currentlyafebrile. WBC4.7. S/pvanc/zosyn(3/10-3/11)for broad spectrum coverage -continue cefepime (3/11-),per ID will not need double coverage with gentamicin. Recommend continuing cefepime till 3/19 -continue stress dose steroids   Thrombocytopeniawith anemia Plateletsof 22.Hgb of 9.5. Likely multifactorial with recent initiation of chemotherapy along with likely bone marrow infiltration by malignancy.  -hematology consulted, appreciate recommendations -monitor on daily CBC -transfuse per hematology recommendations  -discontinue Eliquis  -Afrin nasal spray for nose bleeds  Recent PE with bilateralDVT Stable.Recently admitted in February for PE in [facility]  involving segmental and more peripehral branches and found to have bilateral DVTs. Hypercoagulable given h/o malignancy. Patient was treated with Eliquis and was instructed to continue Eliquis5mg  bidx 3 months. -monitor for s/sx of PE -discontinue 1/2 dose Eliquis 2/2 epistaxis and decreasing plts  Stage IV Right Lung Cancer Adenocarcinoma of unknown primary vsNSCLC with systemic bony metastatic disease.  -Continue homepain regimen -Continue with outpatient management -Wound care per nursing for Port-A-Cath incision -palliative care consultation, appreciate recommendations   Hypokalemia K of2.5. Mg of 1.4 -repleted 64mEq Kphos -repleted Mg with 1g IV Mg  Hypocalcemia Ca of5.6with albumin of1.8.Corrected Ca7.4.  -repleted with Ca gluconate 2g  Hyponatremia Na of131, 126 on admission. Likely 2/2 volume overload  -continue diureses as described above -fluid restricted diet   Hypophosphatemia Phosphorus of2.3 -repleted with KPhos 59meQ   Adjustment disorder with anxious features Acute.She did not have this before and it has been less than 3 months of symptoms according to the patient.Home meds: ativa 1mg  tid prn, mirtazapine 7.5mg  daily, -continue home meds  Tobacco abuse -Nicotine replacement patches as needed -Nursing to provide smoking cessation counseling -encouraged smoking cessation given supplemental O2 use at home, patient reports that she no longer smokes since starting oxygen -Patient continues to smoke while using supplemental O2 at home, will need aggressive counseling on dangers of smoking with O2 use  Neurogenic Bladder Patient reports no urine output and no output charted. Abdomen distended and tender.Possibly 2/2 to increased pain medication -maintainfoley catheter  Protein Calorie Malnutrition  Albumin of1.8 -nutrition consulted, appreciate recommendations  FEN/GI:regular diet, protonix 40mg   daily Prophylaxis:SCDs  Disposition: continued inpatient stay   Subjective:  Patient today reports no complaints. Sitting up in bed eating and on [place].   Objective: Temp:  [97.8 F (36.6 C)-98.8 F (37.1 C)] 97.8 F (36.6 C) (03/16 0410) Pulse Rate:  [99-115] 101 (03/16 0410) Resp:  [19-36] 19 (03/16 0410) BP: (122-148)/(54-80) 122/80 (03/16 0410) SpO2:  [92 %-98 %]  98 % (03/16 0410) Weight:  [164 lb 7.4 oz (74.6 kg)] 164 lb 7.4 oz (74.6 kg) (03/16 0410) Physical Exam: General: awake and alert, NAD, oriented to person and place, knew year but not month, NRB mask in place but keeps removing mask to drink water  Cardiovascular: RRR, no MRG Respiratory: apices clear, crackles in middle lobes and bases bilaterally, able to speak full sentences Abdomen: soft, non tender, non distended, bowel sounds normal  Extremities: non tender, 3+ pedal edema   Laboratory: Recent Labs  Lab 07/01/17 2019 07/02/17 0507 07/03/17 0400  WBC 3.8* 4.7 5.3  HGB 9.6* 9.5* 9.3*  HCT 28.5* 27.9* 28.1*  PLT 45* 33* 22*   Recent Labs  Lab 07/01/17 2019 07/02/17 0507 07/03/17 0400  NA 130* 131* 131*  K 2.3* 2.7* 2.5*  CL 85* 86* 84*  CO2 30 32 34*  BUN 13 11 13   CREATININE 0.83 0.68 0.66  CALCIUM 5.7* 5.6* 5.4*  PROT 6.0* 5.5* 5.3*  BILITOT 0.6 0.7 0.6  ALKPHOS 160* 163* 169*  ALT 30 28 27   AST 33 29 34  GLUCOSE 307* 176* 244*    Imaging/Diagnostic Tests: Dg Chest 2 View  Result Date: 06/27/2017 CLINICAL DATA:  Fever. EXAM: CHEST - 2 VIEW COMPARISON:  Chest radiographs and CTA 06/13/2017 FINDINGS: Right jugular Port-A-Cath terminates over the lower SVC. The cardiac silhouette is normal in size. Aortic atherosclerosis is noted. There is a moderate-sized hiatal hernia. The lungs are hyperinflated with underlying emphysema. Peribronchial thickening and coarsened interstitial markings bilaterally are mildly increased compared to the prior radiographs with minimal patchy density in the left  perihilar and right lower lungs. Trace pleural effusions are questioned. No pneumothorax is identified. IMPRESSION: COPD with mildly increased predominantly interstitial type densities bilaterally which may reflect superimposed acute infection, possibly atypical or viral. Electronically Signed   By: Logan Bores M.D.   On: 06/27/2017 07:51   Dg Chest 2 View  Result Date: 06/13/2017 CLINICAL DATA:  Shortness of breath.  History of lung cancer. EXAM: CHEST  2 VIEW COMPARISON:  August 01, 2014 FINDINGS: No pneumothorax. The heart, hila, and mediastinum are unremarkable. Small hiatal hernia. The nodularity in the right upper lung on the recent PET-CT is not appreciated on this study. IMPRESSION: No cause for shortness of breath or chest pain identified. The no nodularity in the right apex is not well assessed with this study. Electronically Signed   By: Dorise Bullion III M.D   On: 06/13/2017 10:29   Ct Head Wo Contrast  Result Date: 06/27/2017 CLINICAL DATA:  History of lung carcinoma with confusion, initial encounter EXAM: CT HEAD WITHOUT CONTRAST TECHNIQUE: Contiguous axial images were obtained from the base of the skull through the vertex without intravenous contrast. COMPARISON:  06/05/2017 MRI, 03/02/2010 CT of the head FINDINGS: Brain: No evidence of acute infarction, hemorrhage, hydrocephalus, extra-axial collection or mass lesion/mass effect. Vascular: No hyperdense vessel or unexpected calcification. Skull: Changes of hemangiomas are noted similar to that seen on recent MRI. Sinuses/Orbits: Orbits are within normal limits. Mucosal thickening is noted within the maxillary antra bilaterally as well as within the ethmoid and sphenoid sinuses. Small air-fluid level is noted in the right maxillary antrum. Other: None IMPRESSION: Changes of sinusitis. No acute intracranial abnormality is noted. Electronically Signed   By: Inez Catalina M.D.   On: 06/27/2017 18:59   Ct Angio Chest Pe W And/or Wo  Contrast  Result Date: 06/13/2017 CLINICAL DATA:  Shortness of breath.  Suspected PE.  EXAM: CT ANGIOGRAPHY CHEST WITH CONTRAST TECHNIQUE: Multidetector CT imaging of the chest was performed using the standard protocol during bolus administration of intravenous contrast. Multiplanar CT image reconstructions and MIPs were obtained to evaluate the vascular anatomy. CONTRAST:  11mL ISOVUE-370 IOPAMIDOL (ISOVUE-370) INJECTION 76% COMPARISON:  Chest CT May 25, 2017. Chest x-ray June 13, 2017 FINDINGS: Cardiovascular: The heart and coronary arteries are unchanged. Atherosclerotic changes are seen in the nonaneurysmal aorta. 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 No dissection. There is narrowing of right upper lobe pulmonary arterial branches due to soft tissue in the right hilum. Pulmonary emboli are seen in the right lung bases involving segmental and more peripheral branches. No other emboli identified. Mediastinum/Nodes: There is soft tissue in the right hilum which encases the right upper lobe bronchus and upper lobe pulmonary arterial branches. This soft tissue measures up to 2.8 cm. No other adenopathy. There is a moderate hiatal hernia. The thyroid is normal. A Port-A-Cath is identified. No effusions. Lungs/Pleura: The spiculated nodule in the right upper lobe measures 1.6 by 1.0 cm today versus 1.4 x 0.9 cm previously. The small difference could be due to difference in slice selection given the short interval. Scattered scarring. Stable 5 mm nodule in the right lung on series 6, image 77. Other nodules are stable as well. No new masses. Mild ground-glass in the medial right lung on series 6, image 102 is new and could be infectious. No new nodules. No other infiltrates. Upper Abdomen: Air in the liver is stable, likely from previous sphincterotomy. Prominence of the left adrenal gland is likely hyperplasia without nodularity. No other abnormalities in the upper abdomen. Musculoskeletal: Bony  metastatic disease again identified in the spine, manubrium, sternum, and ribs. Review of the MIP images confirms the above findings. IMPRESSION: 1. Pulmonary emboli in the right lower lobe involving segmental and more peripheral branches. 2. Mild ground-glass in the medial right lung could be infectious. 3. The spiculated nodule in the right upper lobe is not significantly changed accounting for difference in technique. 4. Increasing soft tissue in the right hilum with encasement of the right upper lobe bronchus and right upper lobe pulmonary arterial branches resulting in some narrowing. This is suspicious for disease involvement. 5. Known bony metastatic disease. 6. Atherosclerotic changes in the nonaneurysmal aorta. 7. Moderate hiatal hernia. Findings called to and discussed with Dr. Ashok Cordia Aortic Atherosclerosis (ICD10-I70.0). Electronically Signed   By: Dorise Bullion III M.D   On: 06/13/2017 13:30   Mr Brain W Wo Contrast  Result Date: 06/06/2017 CLINICAL DATA:  Non-small cell lung cancer staging EXAM: MRI HEAD WITHOUT AND WITH CONTRAST TECHNIQUE: Multiplanar, multiecho pulse sequences of the brain and surrounding structures were obtained without and with intravenous contrast. CONTRAST:  61mL MULTIHANCE GADOBENATE DIMEGLUMINE 529 MG/ML IV SOLN COMPARISON:  Head CT 03/02/2010 FINDINGS: Brain: The midline structures are normal. There is no acute infarct or acute hemorrhage. No mass lesion, hydrocephalus, dural abnormality or extra-axial collection. The brain parenchymal signal is normal. No age-advanced or lobar predominant atrophy. No chronic microhemorrhage or superficial siderosis. Vascular: Major intracranial arterial and venous sinus flow voids are preserved. Skull and upper cervical spine: There are multiple T1 hyperintense foci within the calvarium, the largest in the right parietal bone. This also shows intrinsic T2 hyperintensity, likely a hemangioma. Sinuses/Orbits: Mild left sphenoid sinus  mucosal thickening. No mastoid or middle ear effusion. Normal orbits. IMPRESSION: 1. No intracranial metastatic disease.  Normal brain. 2. Intrinsically T1/T2 hyperintense foci within the  calvarium are most consistent with hemangiomata. Electronically Signed   By: Ulyses Jarred M.D.   On: 06/06/2017 01:26   Ir US Guide Vasc Access Right  Result Date: 06/09/2017 CLINICAL DATA:  Lung cancer EXAM: TUNNEL POWER PORT PLACEMENT WITH SUBCUTANEOUS POCKET UTILIZING ULTRASOUND & FLOUROSCOPY FLUOROSCOPY TIME:  30 seconds.  Five mGy. MEDICATIONS AND MEDICAL HISTORY: Versed 4 mg, Fentanyl 100 mcg. Additional Medications: Ancef 2 g. Antibiotics were given within 2 hours of the procedure. ANESTHESIA/SEDATION: Moderate sedation time: 26 minutes. Nursing monitored the the patient during the procedure. PROCEDURE: After written informed consent was obtained, patient was placed in the supine position on angiographic table. The right neck and chest was prepped and draped in a sterile fashion. Lidocaine was utilized for local anesthesia. The right jugular vein was noted to be patent initially with ultrasound. Under sonographic guidance, a micropuncture needle was inserted into the right IJ vein (Ultrasound and fluoroscopic image documentation was performed). The needle was removed over an 018 wire which was exchanged for a Amplatz. This was advanced into the IVC. An 8-French dilator was advanced over the Amplatz. A small incision was made in the right upper chest over the anterior right second rib. Utilizing blunt dissection, a subcutaneous pocket was created in the caudal direction. The pocket was irrigated with a copious amount of sterile normal saline. The port catheter was tunneled from the chest incision, and out the neck incision. The reservoir was inserted into the subcutaneous pocket and secured with two 3-0 Ethilon stitches. A peel-away sheath was advanced over the Amplatz wire. The port catheter was cut to measure length  and inserted through the peel-away sheath. The peel-away sheath was removed. The chest incision was closed with 3-0 Vicryl interrupted stitches for the subcutaneous tissue and a running of 4-0 Vicryl subcuticular stitch for the skin. The neck incision was closed with a 4-0 Vicryl subcuticular stitch. Derma-bond was applied to both surgical incisions. The port reservoir was flushed and instilled with heparinized saline. No complications. FINDINGS: A right IJ vein Port-A-Cath is in place with its tip at the cavoatrial junction. COMPLICATIONS: None IMPRESSION: Successful 8 French right internal jugular vein power port placement with its tip at the SVC/RA junction. Electronically Signed   By: Marybelle Killings M.D.   On: 06/09/2017 16:41   Dg Chest Port 1 View  Result Date: 07/01/2017 CLINICAL DATA:  62 year old female with metastatic lung cancer. Shortness of breath and fever. Pulmonary emboli last month. EXAM: PORTABLE CHEST 1 VIEW COMPARISON:  06/30/2017 and earlier. FINDINGS: Portable AP upright view at 0555 hours. Stable right chest porta cath, accessed. Diffuse reticulonodular pulmonary opacity has developed since February, is progressed since 06/28/2017, but stable since yesterday. No associated pneumothorax or pleural effusion. Stable cardiac size and mediastinal contours. Moderate hiatal hernia re-demonstrated. Negative visible bowel gas pattern. IMPRESSION: Progressed diffuse reticulonodular pulmonary opacity since 06/28/2017, stable since yesterday. Differential considerations in this clinical setting include acute viral/atypical infection, lymphangitic carcinomatosis, and less likely acute interstitial edema. Electronically Signed   By: Genevie Ann M.D.   On: 07/01/2017 09:21   Dg Chest Port 1 View  Result Date: 06/30/2017 CLINICAL DATA:  Increasing shortness of breath, history of lung carcinoma EXAM: PORTABLE CHEST 1 VIEW COMPARISON:  06/28/2017 FINDINGS: Right-sided chest wall port is again noted and  stable. Cardiac shadow is stable. Diffuse increased interstitial changes are identified increased from the prior exam consistent with pulmonary edema. No focal confluent infiltrate is seen. No sizable effusion is noted. No bony  abnormality is seen. IMPRESSION: Increasing interstitial changes consistent with edema. Electronically Signed   By: Inez Catalina M.D.   On: 06/30/2017 08:13   Dg Chest Port 1 View  Result Date: 06/28/2017 CLINICAL DATA:  Septic shock.  History of lung carcinoma EXAM: PORTABLE CHEST 1 VIEW COMPARISON:  June 27, 2017 chest radiograph and chest CT June 13, 2017 FINDINGS: There is diffuse interstitial prominence consistent with either pulmonary edema or lymphangitic spread of tumor. There is atelectatic change in the left base with questionable airspace consolidation immediately adjacent to a hiatal hernia. Heart is upper normal in size. There is mild pulmonary venous hypertension. There is aortic atherosclerosis. There is no adenopathy apparent by radiography. Central catheter tip is in the superior vena cava near the cavoatrial junction. No pneumothorax. Bony metastases are better appreciated on recent CT than on this study, although several ribs show evidence suggesting bony metastatic disease. Moderate hiatal hernia noted. There is aortic atherosclerosis. IMPRESSION: Interstitial prominence. Question a degree of pulmonary edema versus lymphangitic spread of tumor. Both entities may exist concurrently. Note that there is pulmonary venous hypertension. Question atelectasis versus early pneumonia left base. Moderate hiatal hernia. Stable cardiac silhouette. Bony metastatic disease, better seen on recent CT. There is aortic atherosclerosis. Aortic Atherosclerosis (ICD10-I70.0). Electronically Signed   By: Lowella Grip III M.D.   On: 06/28/2017 07:55   Dg Chest Port 1v Same Day  Result Date: 07/02/2017 CLINICAL DATA:  Follow-up bilateral infiltrates EXAM: PORTABLE CHEST 1 VIEW  COMPARISON:  07/01/2017 FINDINGS: Cardiac shadow is stable. Right chest wall port is again noted. Hiatal hernia is seen and stable. Diffuse bilateral opacities are again seen and stable. No new focal abnormality is noted. IMPRESSION: No change from the prior exam. Electronically Signed   By: Inez Catalina M.D.   On: 07/02/2017 09:03   Mm Screening Breast Tomo Bilateral  Result Date: 06/10/2017 CLINICAL DATA:  Screening. EXAM: DIGITAL SCREENING BILATERAL MAMMOGRAM WITH TOMO AND CAD COMPARISON:  Previous exam(s). ACR Breast Density Category c: The breast tissue is heterogeneously dense, which may obscure small masses. FINDINGS: There are no findings suspicious for malignancy. Images were processed with CAD. IMPRESSION: No mammographic evidence of malignancy. A result letter of this screening mammogram will be mailed directly to the patient. RECOMMENDATION: Screening mammogram in one year. (Code:[id number]-[id number]) BI-RADS CATEGORY  1: Negative. Electronically Signed   By: Lajean Manes M.D.   On: 06/10/2017 09:40   Ir Fluoro Guide Port Insertion Right  Result Date: 06/09/2017 CLINICAL DATA:  Lung cancer EXAM: TUNNEL POWER PORT PLACEMENT WITH SUBCUTANEOUS POCKET UTILIZING ULTRASOUND & FLOUROSCOPY FLUOROSCOPY TIME:  30 seconds.  Five mGy. MEDICATIONS AND MEDICAL HISTORY: Versed 4 mg, Fentanyl 100 mcg. Additional Medications: Ancef 2 g. Antibiotics were given within 2 hours of the procedure. ANESTHESIA/SEDATION: Moderate sedation time: 26 minutes. Nursing monitored the the patient during the procedure. PROCEDURE: After written informed consent was obtained, patient was placed in the supine position on angiographic table. The right neck and chest was prepped and draped in a sterile fashion. Lidocaine was utilized for local anesthesia. The right jugular vein was noted to be patent initially with ultrasound. Under sonographic guidance, a micropuncture needle was inserted into the right IJ vein (Ultrasound and fluoroscopic  image documentation was performed). The needle was removed over an 018 wire which was exchanged for a Amplatz. This was advanced into the IVC. An 8-French dilator was advanced over the Amplatz. A small incision was made in the right upper chest over the  anterior right second rib. Utilizing blunt dissection, a subcutaneous pocket was created in the caudal direction. The pocket was irrigated with a copious amount of sterile normal saline. The port catheter was tunneled from the chest incision, and out the neck incision. The reservoir was inserted into the subcutaneous pocket and secured with two 3-0 Ethilon stitches. A peel-away sheath was advanced over the Amplatz wire. The port catheter was cut to measure length and inserted through the peel-away sheath. The peel-away sheath was removed. The chest incision was closed with 3-0 Vicryl interrupted stitches for the subcutaneous tissue and a running of 4-0 Vicryl subcuticular stitch for the skin. The neck incision was closed with a 4-0 Vicryl subcuticular stitch. Derma-bond was applied to both surgical incisions. The port reservoir was flushed and instilled with heparinized saline. No complications. FINDINGS: A right IJ vein Port-A-Cath is in place with its tip at the cavoatrial junction. COMPLICATIONS: None IMPRESSION: Successful 8 French right internal jugular vein power port placement with its tip at the SVC/RA junction. Electronically Signed   By: Marybelle Killings M.D.   On: 06/09/2017 16:41     Sela Hilding, MD 07/03/2017, 10:31 AM PGY-2, [facility] Intern pager: [phone], text pages welcome

## 2017-07-03 NOTE — Progress Notes (Signed)
Surprisingly enough, [name] is doing a little bit better.  She is not using the facemask oxygen.  She is on nasal cannula.  Her O2 saturation is about 90%.  Her chest x-ray yesterday looked stable.  I think this is encouraging.  She is still having quite a bit of pain.  As comfort is our primary goal right now, we will need to be a little more aggressive in managing this.  Her labs show white cell count of 5.3.  Hemoglobin 9.3.Lately count 22,000.  Her potassium still low.  Her blood sugar is high.  I do not think she needs hydrocortisone every 6 hours.  Her albumin is only 1.7.  She now is on 6 N.  She is a little more alert.  I am glad that her O2 saturation is doing well.  I think this will tell us how she is going to do.  Again, we have to keep comfort as a primary goal.  As such, an adjustment in her pain medications probably is necessary.  She seems to be eating a little bit better.  I know that she will get great care from all the staff up on 6 N.  [name] Haw, MD  Oswaldo Milian 41:10

## 2017-07-03 NOTE — Progress Notes (Signed)
FPTS Interim Note:   Called to bedside by RN for family discussion. [name] would like to ask if we think we are doing "too much." She has been thinking a lot about her care and would like to stop antibiotics and electrolyte replacement. She would like to go home as soon as she is able and we can get home hospice arranged. We will transition to full comfort care. [name] participated in discussion and they both are extremely appreciative of RN staff and Dr. Andria Frames and Dr. Marin Olp. Our team will call CM and hospice first thing in am to arrange services. We will stop antibiotics, electrolyte replacement, CBGs, labs now.   Ralene Ok, MD

## 2017-07-04 LAB — GLUCOSE, CAPILLARY
GLUCOSE-CAPILLARY: 165 mg/dL — AB (ref 65–99)
Glucose-Capillary: 339 mg/dL — ABNORMAL HIGH (ref 65–99)

## 2017-07-04 MED ORDER — MORPHINE SULFATE 15 MG PO TABS
15.0000 mg | ORAL_TABLET | ORAL | Status: DC | PRN
  Administered 2017-07-04: 15 mg via ORAL
  Administered 2017-07-05 (×3): 7.5 mg via ORAL
  Filled 2017-07-04 (×4): qty 1

## 2017-07-04 NOTE — Progress Notes (Signed)
[facility] Liaison:  RN visit  Notified by Caryl Pina [name], of patient/family request for [facility] services at home after discharge.  Chart and patient information are being reviewed with an [place].  Hospice eligibility pending at this time.  Writer spoke with patient and [name], boyfriend, at bedside to initiate education related to hospice philosophy, services and team approach to care.  Patient/family verbalized understanding of information given.  Per discussion, plan is for discharge to home by PTAR 07/04/17 or 07/05/17.  Please send signed and completed DNR form home with patient/family.  Patient will need prescriptions for discharge comfort medications.  DME needs have been discussed, patient currently has the following equipment in the home:  Nebulizer.  Patient/family requests the following DME for delivery to the home:  O2 concentrator x 2 and 3in1.  [facility] equipment manager, Gayland Curry, has been notified and order has been made with [facility] to arrange delivery to the home.  The home address has been verified and is correct in the chart.  [name], boyfriend,  is the family member to contact to arrange time of delivery at [phone].  [facility] is aware of the above.  Completed discharge summary will need to be faxed to [facility] at [phone], when final.  Please notify [facility] when patient is ready to leave the unit at discharge.  (Call [phone] or [phone] if its after 5pm.)  [facility] information and contact numbers have been given to [place] during visit.  Above information shared with Caryl Pina, [facility].  Please call with any hospice related questions.  Thank you for the referral.  Edyth Gunnels, RN, [facility] Liaison [phone]  All hospital liaisons are now on [place].

## 2017-07-04 NOTE — Progress Notes (Addendum)
Family Medicine Teaching Service Daily Progress Note Intern Pager: [phone]  Patient name: [name] Medical record number: [id number] Date of birth: [birth year] Age: 62 y.o. Gender: female  Primary Care Provider: Nuala Alpha, DO Consultants: Hematology, Palliative care, CCM, ID  Code Status: Full   Pt Overview and Major Events to Date:  Admitted to [facility] on 06/26/17 for thromboyctopenia  Transferred to ICU on 06/27/17 Transferred back to [place] on 06/29/17 3/16 patient and family elected comfort care  Assessment and Plan: [name] Templetonis a 62 y.o.femalepresenting with thrombocytopenia. PMH is significant forStage IV adenocarcinoma of unknown primary versus NSCLC, recent PE with bilateral DVT (currently on Eliquis)bony metastasis throughout spine,ribs, sacrum, tobacco abuse, chronic pancreatitis s/p biliary stenting, MDD, anxiety, alcohol abuse, probable COPD  Acute on chronic hypoxemic respiratory failure  Transitioned to full comfort care.  -maintain [place] for comfort -O2 sats PRN patient request -consulting case management and hospice this am -lasix 40mg  IV QD  Pseudomonal Bacteremia with septic shock Blood cultures growing pseudomonas auriginosa. Currentlyafebrile. WBC4.7. S/pvanc/zosyn(3/10-3/11)for broad spectrum coverage -no antibiotics   Thrombocytopeniawith anemia Plateletsof 22.Hgb of 9.5. Likely multifactorial with recent initiation of chemotherapy along with likely bone marrow infiltration by malignancy.  -no additional labs -discontinue Eliquis  -Afrin nasal spray for nose bleeds  Recent PE with bilateralDVT Stable.Recently admitted in February for PE in RLL involving segmental and more peripehral branches and found to have bilateral DVTs. Hypercoagulable given h/o malignancy. Patient was treated with Eliquis and was instructed to continue Eliquis5mg  bidx 3 months. -no labs or anticoagulation  Stage IV Right Lung Cancer Adenocarcinoma  of unknown primary vsNSCLC with systemic bony metastatic disease.  -Continue homepain regimen -home with hospice today  Adjustment disorder with anxious features Acute.She did not have this before and it has been less than 3 months of symptoms according to the patient.Home meds: ativa 1mg  tid prn, mirtazapine 7.5mg  daily, -continue home meds  Neurogenic Bladder Patient reports no urine output and no output charted. Abdomen distended and tender.Possibly 2/2 to increased pain medication -maintainfoley catheter  FEN/GI:regular diet, protonix 40mg  daily Prophylaxis:SCDs  Disposition: home with hospice today  Subjective:  Patient in back pain this morning, eager to go home.   Objective: Temp:  [98 F (36.7 C)-98.6 F (37 C)] 98 F (36.7 C) (03/17 0649) Pulse Rate:  [102-116] 116 (03/17 0649) Resp:  [18-19] 19 (03/17 0649) BP: (122-143)/(57-67) 122/67 (03/17 0649) SpO2:  [92 %-100 %] 94 % (03/17 0649) Physical Exam: General: awake and alert, NAD Cardiovascular: RRR, no MRG Respiratory: coarse breath sounds throughout, slightly increased WOB Abdomen: soft, non tender, non distended, bowel sounds normal  Extremities: non tender, 3+ pedal edema   Laboratory: Recent Labs  Lab 07/01/17 2019 07/02/17 0507 07/03/17 0400  WBC 3.8* 4.7 5.3  HGB 9.6* 9.5* 9.3*  HCT 28.5* 27.9* 28.1*  PLT 45* 33* 22*   Recent Labs  Lab 07/01/17 2019 07/02/17 0507 07/03/17 0400 07/03/17 1446  NA 130* 131* 131*  --   K 2.3* 2.7* 2.5*  --   CL 85* 86* 84*  --   CO2 30 32 34*  --   BUN 13 11 13   --   CREATININE 0.83 0.68 0.66  --   CALCIUM 5.7* 5.6* 5.4*  --   PROT 6.0* 5.5* 5.3*  --   BILITOT 0.6 0.7 0.6  --   ALKPHOS 160* 163* 169*  --   ALT 30 28 27   --   AST 33 29 34  --   GLUCOSE  307* 176* 244* 458*    No new imaging.  [name] Hilding, MD 07/04/2017, 7:22 AM PGY-2, [place] Intern pager: [phone], text pages welcome

## 2017-07-04 NOTE — Progress Notes (Signed)
Pt visiting with family at bedside.

## 2017-07-04 NOTE — Progress Notes (Signed)
Discharge order signed this AM with plan for patient to return home with hospice.  Discussed with case management and arrangements made by Edyth Gunnels (home hospice).   However, patient's partner left hospital this afternoon to ensure things were in place at home and had returned this evening at 8 PM.  Patient will stay through tonight and I will continue to support as needed.   Additionally, patient had requested for IV dilaudid at home upon discharge. Informed pt that unfortunately this was not an option .  I have changed her to po morphine drops to transition and see if this provides her comfort and pain relief.    Later this evening, received page from RN that patient was appearing drowsy, although responsive to questions, following her po morphine dose. I examined pt bedside at 9 PM.  She appeared tired, answering some questions and with delayed responses to others.  Per her partner, she is improved from earlier.  On my exam, she is oriented and otherwise appears well. No increased work of breathing and no current complaints of pain.   Discussed holding next prn dose with RN to see if she improves.   Will continue to monitor.   Lovenia Kim, MD [place], PGY-2

## 2017-07-04 NOTE — Progress Notes (Signed)
Called and left a message for Caryl Pina, CM that pt is trying to get home with Hospice asap.

## 2017-07-04 NOTE — Care Management (Signed)
Call received from nursing unit that plans are to d/c pt to home hospice care today.  Discussed HH vs Home hospice with patient and husband at bedside.   Pt and husband agree that they do not want to pursue curative therapies and want to go home with hospice care.  Home hospice choice offered and they choose [facility].  They know an employee there and would like to be in touch with them.  Amy of [facility] contacted and given this information.  Amy in to talk to the patient currently.  CM will continue to follow for d/c needs.

## 2017-07-04 NOTE — Progress Notes (Signed)
Pt sleeping at present, MSIR with good relief.

## 2017-07-05 ENCOUNTER — Inpatient Hospital Stay: Payer: BLUE CROSS/BLUE SHIELD | Admitting: Family Medicine

## 2017-07-05 ENCOUNTER — Ambulatory Visit: Payer: BLUE CROSS/BLUE SHIELD

## 2017-07-05 ENCOUNTER — Other Ambulatory Visit: Payer: BLUE CROSS/BLUE SHIELD

## 2017-07-05 ENCOUNTER — Ambulatory Visit: Payer: BLUE CROSS/BLUE SHIELD | Admitting: Hematology & Oncology

## 2017-07-05 DIAGNOSIS — E43 Unspecified severe protein-calorie malnutrition: Secondary | ICD-10-CM

## 2017-07-05 MED ORDER — IBUPROFEN 400 MG PO TABS
400.0000 mg | ORAL_TABLET | Freq: Once | ORAL | Status: AC
  Administered 2017-07-05: 400 mg via ORAL
  Filled 2017-07-05: qty 1

## 2017-07-05 MED ORDER — MORPHINE SULFATE 15 MG PO TABS
7.5000 mg | ORAL_TABLET | Freq: Once | ORAL | Status: AC
  Administered 2017-07-05: 7.5 mg via ORAL
  Filled 2017-07-05: qty 1

## 2017-07-05 MED ORDER — MORPHINE SULFATE 15 MG PO TABS: 15.0000 mg | ORAL_TABLET | ORAL | 0 refills | Status: AC | PRN

## 2017-07-05 MED ORDER — MORPHINE SULFATE 15 MG PO TABS: 15.0000 mg | ORAL_TABLET | ORAL | 0 refills | Status: DC | PRN

## 2017-07-05 MED FILL — MORPHINE SULFATE IR 15 MG T: 15 | 4 days supply | Qty: 30 | Fill #0

## 2017-07-05 NOTE — Care Management (Signed)
Patient and [name] requesting hospital bed to be delivered to home before discharge. Called EVA with [facility], she will order same through [facility]. [facility] will call [name] for delivery. Once hospital bed is delivered [name] will call patient's nurse for PTAR to be called.   Magdalen Spatz RN BSN [phone]

## 2017-07-05 NOTE — Progress Notes (Signed)
Patient appeared overly drowsy but easily arousable. Patient had delayed responses and would occasionally respond by nodding her head. Vital signs obtained. Notified on-call MD and asked MD to return call to speak with RN. MD returned call; RN notified MD of patient's change in condition and of patient's vital signs. MD stated to hold next PRN dose of morphine at this time and that she will come assess the patient.

## 2017-07-05 NOTE — Progress Notes (Addendum)
Patient transitioning to comfort care. Hospice to assume care after discharge and arrangements are being made for equipment at home-possible discharge today. Inpatient palliative care will sign off for now-please call with any questions or concerns.  Lane Hacker, DO Palliative Medicine [phone]  No Charge Visit

## 2017-07-05 NOTE — Care Management (Signed)
ED CM received call from [facility] on 6N to confirm the delivery of hospital bed tonight.  CM contacted [place] who verified delivery time arranged tonight between 4- 8pm. Updated Patty RN

## 2017-07-05 NOTE — Progress Notes (Signed)
Family Medicine Teaching Service Daily Progress Note Intern Pager: [phone]  Patient name: [name] Medical record number: [id number] Date of birth: [birth year] Age: 62 y.o. Gender: female  Primary Care Provider: Nuala Alpha, DO Consultants: Hematology, Palliative care, CCM, ID  Code Status: Full   Pt Overview and Major Events to Date:  Admitted to [facility] on 06/26/17 for thromboyctopenia  Transferred to ICU on 06/27/17 Transferred back to [facility] on 06/29/17 3/16 patient and family elected comfort care w/ home hospice  Assessment and Plan: [name] Templetonis a 62 y.o.femalepresenting with thrombocytopenia. PMH is significant forStage IV adenocarcinoma of unknown primary versus NSCLC, recent PE with bilateral DVT (currently on Eliquis)bony metastasis throughout spine,ribs, sacrum, tobacco abuse, chronic pancreatitis s/p biliary stenting, MDD, anxiety, alcohol abuse, probable COPD  Acute on chronic hypoxemic respiratory failure  Transitioned to full comfort care.  -maintain [place] for comfort -O2 sats PRN patient request -consulting case management and hospice this am -lasix 40mg  IV QD  Pseudomonal Bacteremia with septic shock Blood cultures growing pseudomonas auriginosa. Currentlyafebrile. WBC4.7. S/pvanc/zosyn(3/10-3/11)for broad spectrum coverage -no antibiotics   Thrombocytopeniawith anemia Plateletsof 22.Hgb of 9.5. Likely multifactorial with recent initiation of chemotherapy along with likely bone marrow infiltration by malignancy.  -no additional labs -discontinue Eliquis  -Afrin nasal spray for nose bleeds  Recent PE with bilateralDVT Stable.Recently admitted in February for PE in RLL involving segmental and more peripehral branches and found to have bilateral DVTs. Hypercoagulable given h/o malignancy. Patient was treated with Eliquis and was instructed to continue Eliquis5mg  bidx 3 months. -no labs or anticoagulation  Stage IV Right Lung  Cancer Adenocarcinoma of unknown primary vsNSCLC with systemic bony metastatic disease.  -will d/c with fentanyl patch and oxy IR -home with hospice today  Adjustment disorder with anxious features Acute.She did not have this before and it has been less than 3 months of symptoms according to the patient.Home meds: ativa 1mg  tid prn, mirtazapine 7.5mg  daily, -continue home meds  Neurogenic Bladder Patient reports no urine output and no output charted. Abdomen distended and tender.Possibly 2/2 to increased pain medication -maintainfoley catheter  FEN/GI:regular diet, protonix 40mg  daily Prophylaxis:SCDs  Disposition: home with hospice today if bed delivered in time  Subjective:  Patient was comfortable on AM rounds, eager to go home but wants hospital bed delivered first  Objective: Temp:  [97.9 F (36.6 C)-99.3 F (37.4 C)] 97.9 F (36.6 C) (03/18 1402) Pulse Rate:  [98-113] 98 (03/18 1444) Resp:  [20-24] 20 (03/18 1444) BP: (107-111)/(51-54) 110/51 (03/18 1402) SpO2:  [92 %-99 %] 98 % (03/18 1444) Physical Exam: General: awake and alert, frail and clearly ill Cardiovascular: RRR, no MRG Respiratory: coarse breath sounds throughout, slightly increased WOB Abdomen: soft, non tender, non distended, bowel sounds normal  Extremities: non tender, 3+ pedal edema   Laboratory: Recent Labs  Lab 07/01/17 2019 07/02/17 0507 07/03/17 0400  WBC 3.8* 4.7 5.3  HGB 9.6* 9.5* 9.3*  HCT 28.5* 27.9* 28.1*  PLT 45* 33* 22*   Recent Labs  Lab 07/01/17 2019 07/02/17 0507 07/03/17 0400 07/03/17 1446  NA 130* 131* 131*  --   K 2.3* 2.7* 2.5*  --   CL 85* 86* 84*  --   CO2 30 32 34*  --   BUN 13 11 13   --   CREATININE 0.83 0.68 0.66  --   CALCIUM 5.7* 5.6* 5.4*  --   PROT 6.0* 5.5* 5.3*  --   BILITOT 0.6 0.7 0.6  --   ALKPHOS 160* 163* 169*  --  ALT 30 28 27   --   AST 33 29 34  --   GLUCOSE 307* 176* 244* 458*    No new imaging.  Sherene Sires,  DO 07/05/2017, 3:29 PM PGY-1, [place] Intern pager: [phone], text pages welcome

## 2017-07-05 NOTE — Progress Notes (Signed)
Nutrition Brief Note  Chart reviewed. Pt now transitioning to comfort care.  No further nutrition interventions warranted at this time.  Please re-consult as needed.   Barkley Kratochvil A. Jereld Presti, RD, LDN, CDE Pager: [phone] After hours Pager: [phone]  

## 2017-07-05 NOTE — Care Management Note (Signed)
Case Management Note  Patient Details  Name: [name] MRN: [id number] Date of Birth: 1956/01/03  Subjective/Objective:                    Action/Plan:  Confirmed address and DME including oxygen has been delivered to home.   Called Harmon Pier with [facility] aware discharge is this am.    PTAR paperwork placed in shadow chart, for when discharge takes place.  Paged MD to sign DNR gold form. Expected Discharge Date:  07/04/17               Expected Discharge Plan:  Home w Hospice Care  In-House Referral:     Discharge planning Services  CM Consult  Post Acute Care Choice:  Hospice Choice offered to:  Patient, Spouse  DME Arranged:  Oxygen DME Agency:  [place]:  NA [place] Agency:  [facility]  Status of Service:  In process, will continue to follow  If discussed at Long Length of Stay Meetings, dates discussed:    Additional Comments:  Marilu Favre, RN 07/05/2017, 8:59 AM

## 2017-07-05 NOTE — Progress Notes (Signed)
PTAR here at this time to transport pt home.

## 2017-07-08 ENCOUNTER — Ambulatory Visit: Payer: BLUE CROSS/BLUE SHIELD | Admitting: Radiation Oncology

## 2017-07-08 NOTE — Telephone Encounter (Signed)
Opened in error. Raylea Adcox Kennon Holter, CMA

## 2017-07-12 ENCOUNTER — Inpatient Hospital Stay: Payer: BLUE CROSS/BLUE SHIELD

## 2017-07-12 ENCOUNTER — Inpatient Hospital Stay: Payer: BLUE CROSS/BLUE SHIELD | Admitting: Hematology & Oncology

## 2017-07-26 ENCOUNTER — Other Ambulatory Visit: Payer: BLUE CROSS/BLUE SHIELD

## 2017-07-26 ENCOUNTER — Ambulatory Visit: Payer: BLUE CROSS/BLUE SHIELD | Admitting: Hematology & Oncology

## 2017-07-26 ENCOUNTER — Ambulatory Visit: Payer: BLUE CROSS/BLUE SHIELD

## 2017-08-02 ENCOUNTER — Inpatient Hospital Stay: Payer: BLUE CROSS/BLUE SHIELD

## 2017-08-02 ENCOUNTER — Telehealth: Payer: Self-pay | Admitting: *Deleted

## 2017-08-02 ENCOUNTER — Inpatient Hospital Stay: Payer: BLUE CROSS/BLUE SHIELD | Admitting: Hematology & Oncology

## 2017-08-16 ENCOUNTER — Ambulatory Visit: Payer: BLUE CROSS/BLUE SHIELD

## 2017-08-16 ENCOUNTER — Other Ambulatory Visit: Payer: BLUE CROSS/BLUE SHIELD

## 2017-08-16 ENCOUNTER — Ambulatory Visit: Payer: BLUE CROSS/BLUE SHIELD | Admitting: Hematology & Oncology

## 2017-08-18 NOTE — Telephone Encounter (Signed)
Received call from [place] stating that patient had passed away at home.  04-10-24May 15, 2024 at 1:14pm  Dr Marin Olp notified.

## 2017-08-18 DEATH — deceased

## 2018-03-24 IMAGING — DX DG LUMBAR SPINE COMPLETE 4+V
5 series · 5 of 5 positions shown · non-contrast
Comparison: CT 03/25/2016

CLINICAL DATA: Low back pain

EXAM:
LUMBAR SPINE - COMPLETE 4+ VIEW

[t lumbar spine ap]
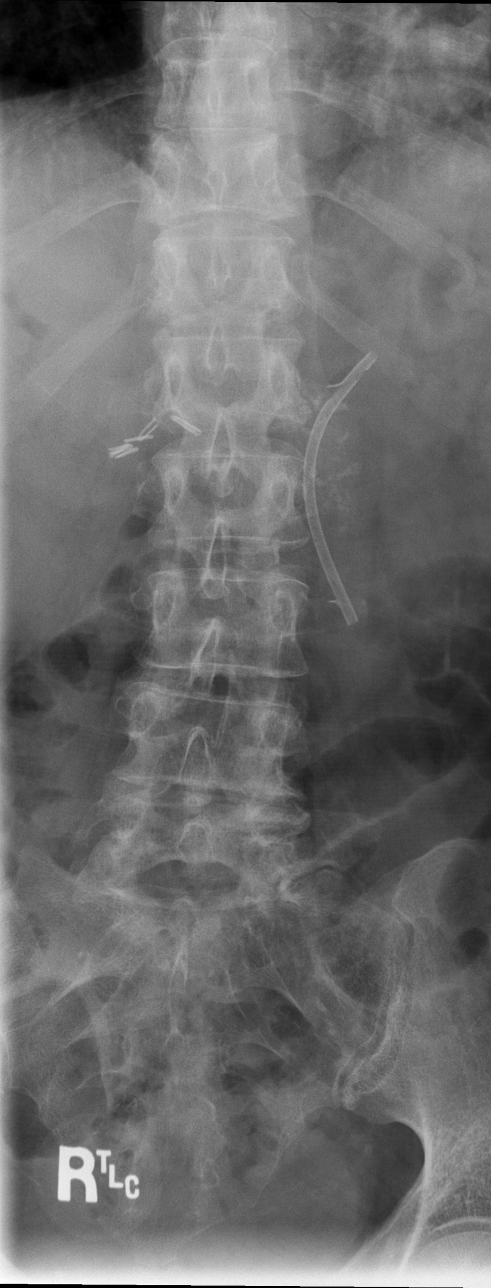

[t lumbar spine obl (1 of 2)]
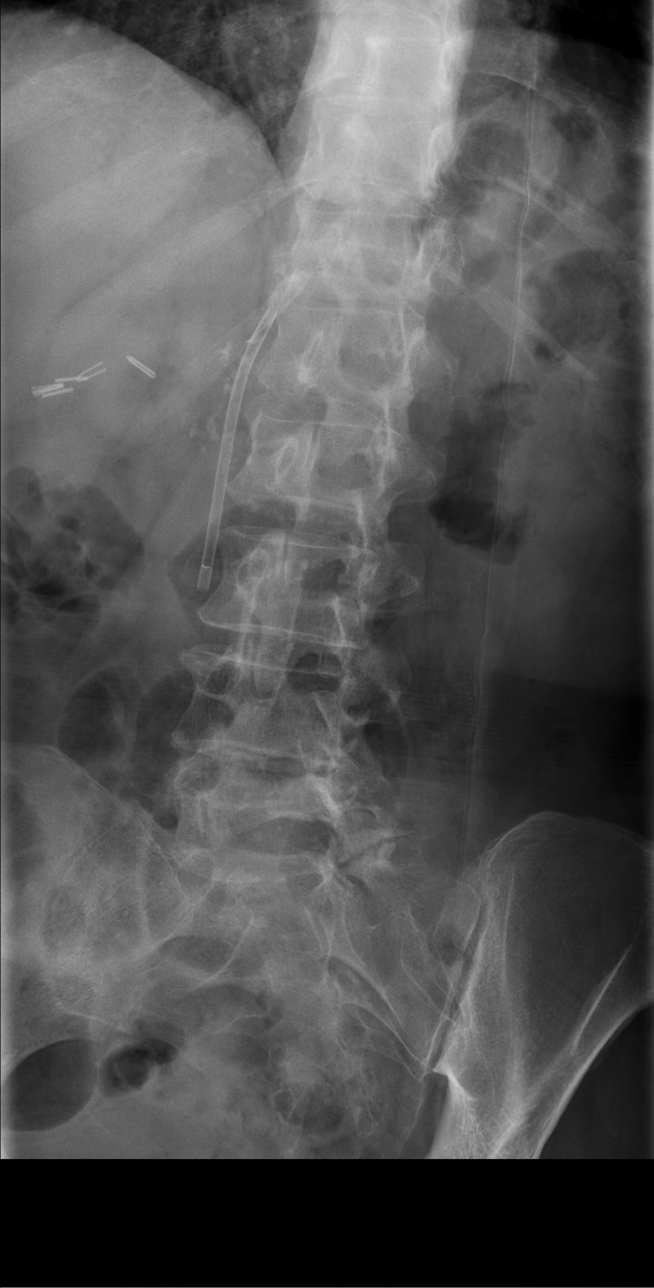

[t lumbar spine obl (2 of 2)]
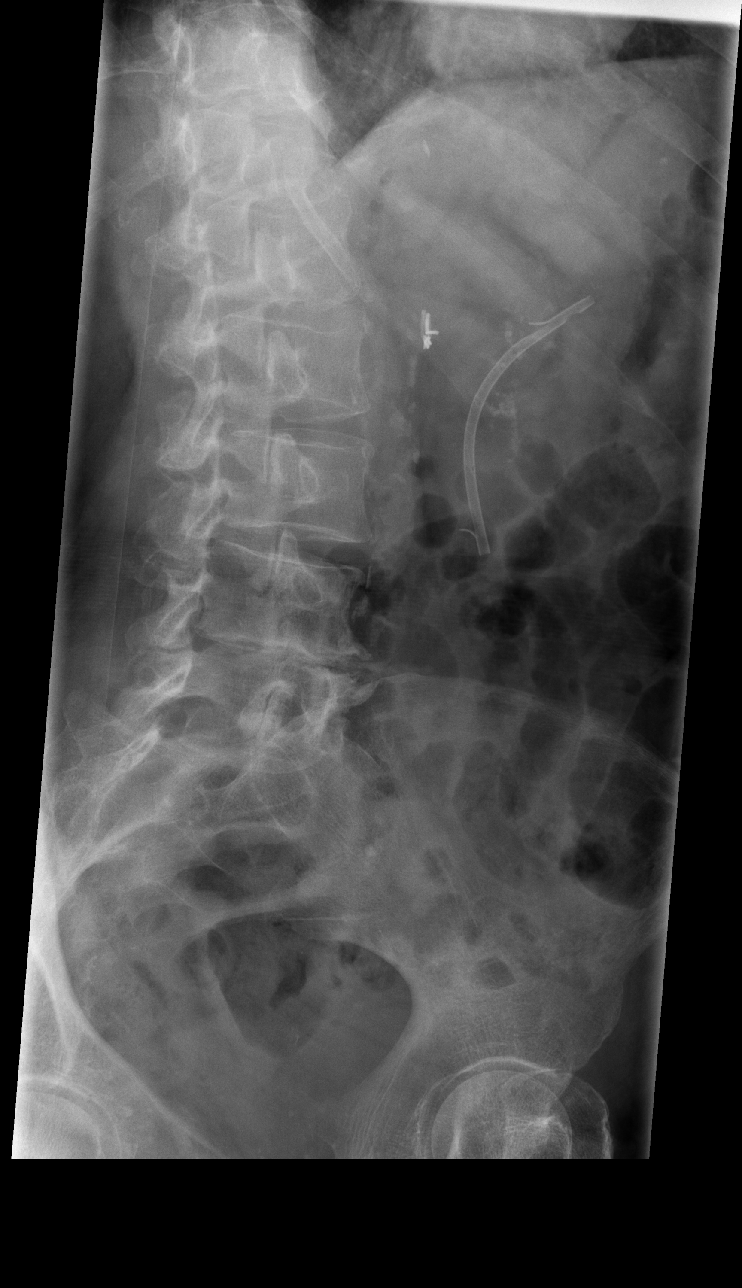

[t lumbar spine lat]
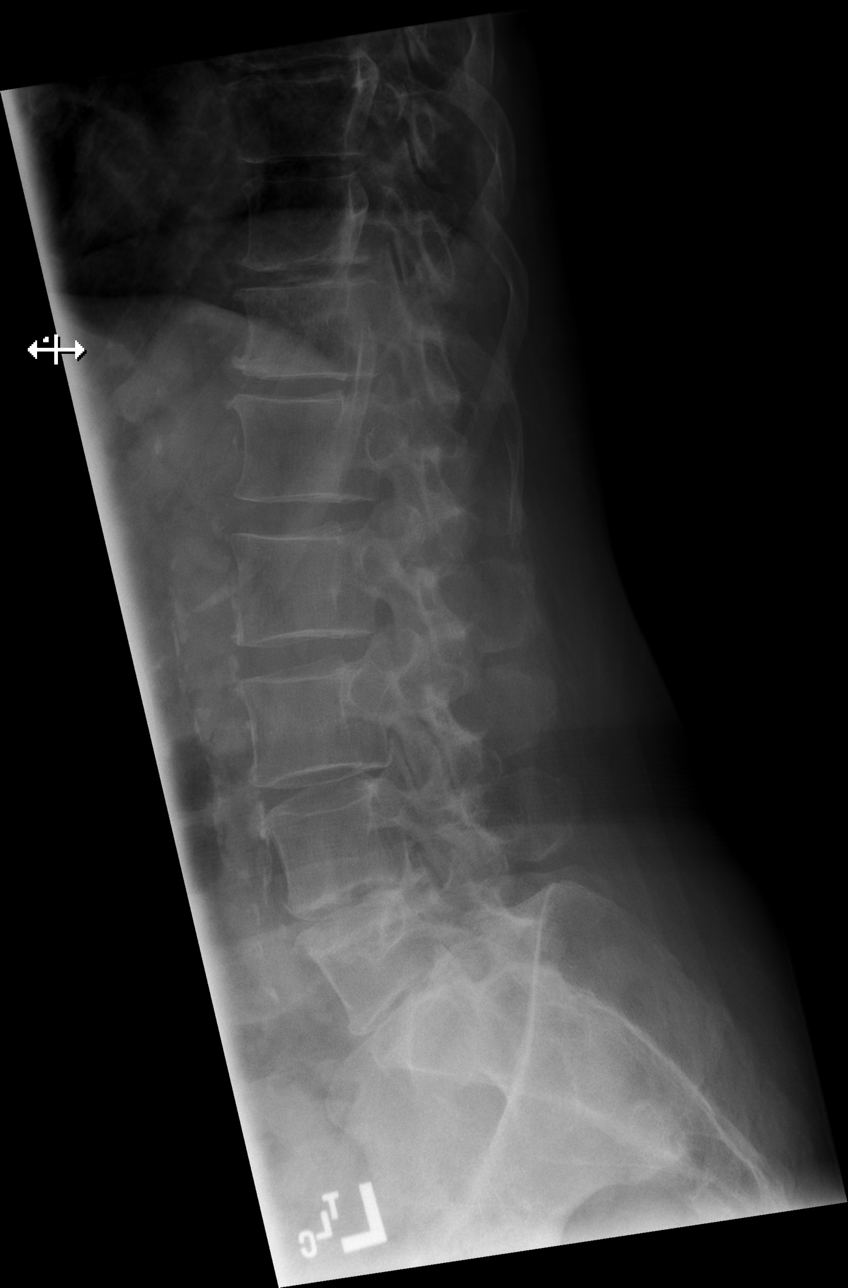

[t lumbar l-5 s-1 spot]
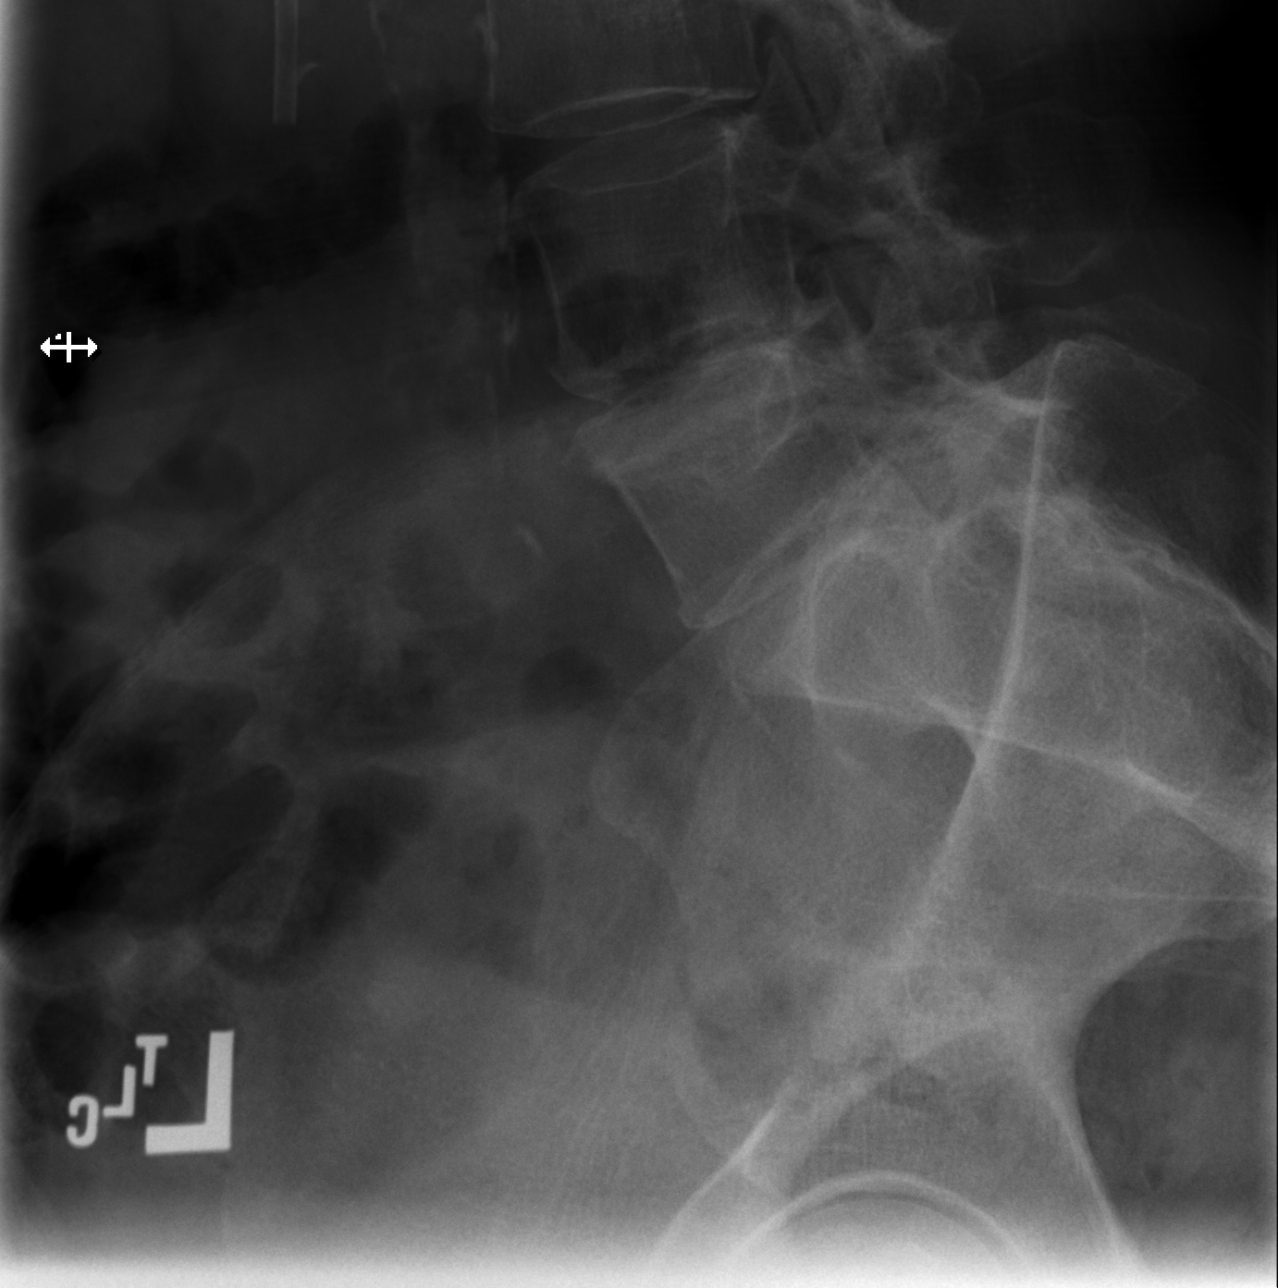

[5 of 5 positions shown; findings below may reference images not displayed]

FINDINGS: Degenerative disc and facet disease in the lower lumbar spine with
disc space narrowing and spurring. Normal alignment. No fracture.
Atherosclerotic change in the scratched at atherosclerotic
calcifications in the aorta. No visible aneurysm. Calcifications in
the pancreatic head region as seen on prior CT. Prior
cholecystectomy. Plastic biliary stent noted in place.
IMPRESSION: Degenerative disc and facet disease in the lower lumbar spine. No
acute bony abnormality.

## 2018-06-24 IMAGING — DX DG CHEST 1V PORT
1 series · 1 of 1 positions shown · non-contrast
Comparison: 06/28/2017

CLINICAL DATA: Increasing shortness of breath, history of lung
carcinoma

EXAM:
PORTABLE CHEST 1 VIEW

[chest ap]
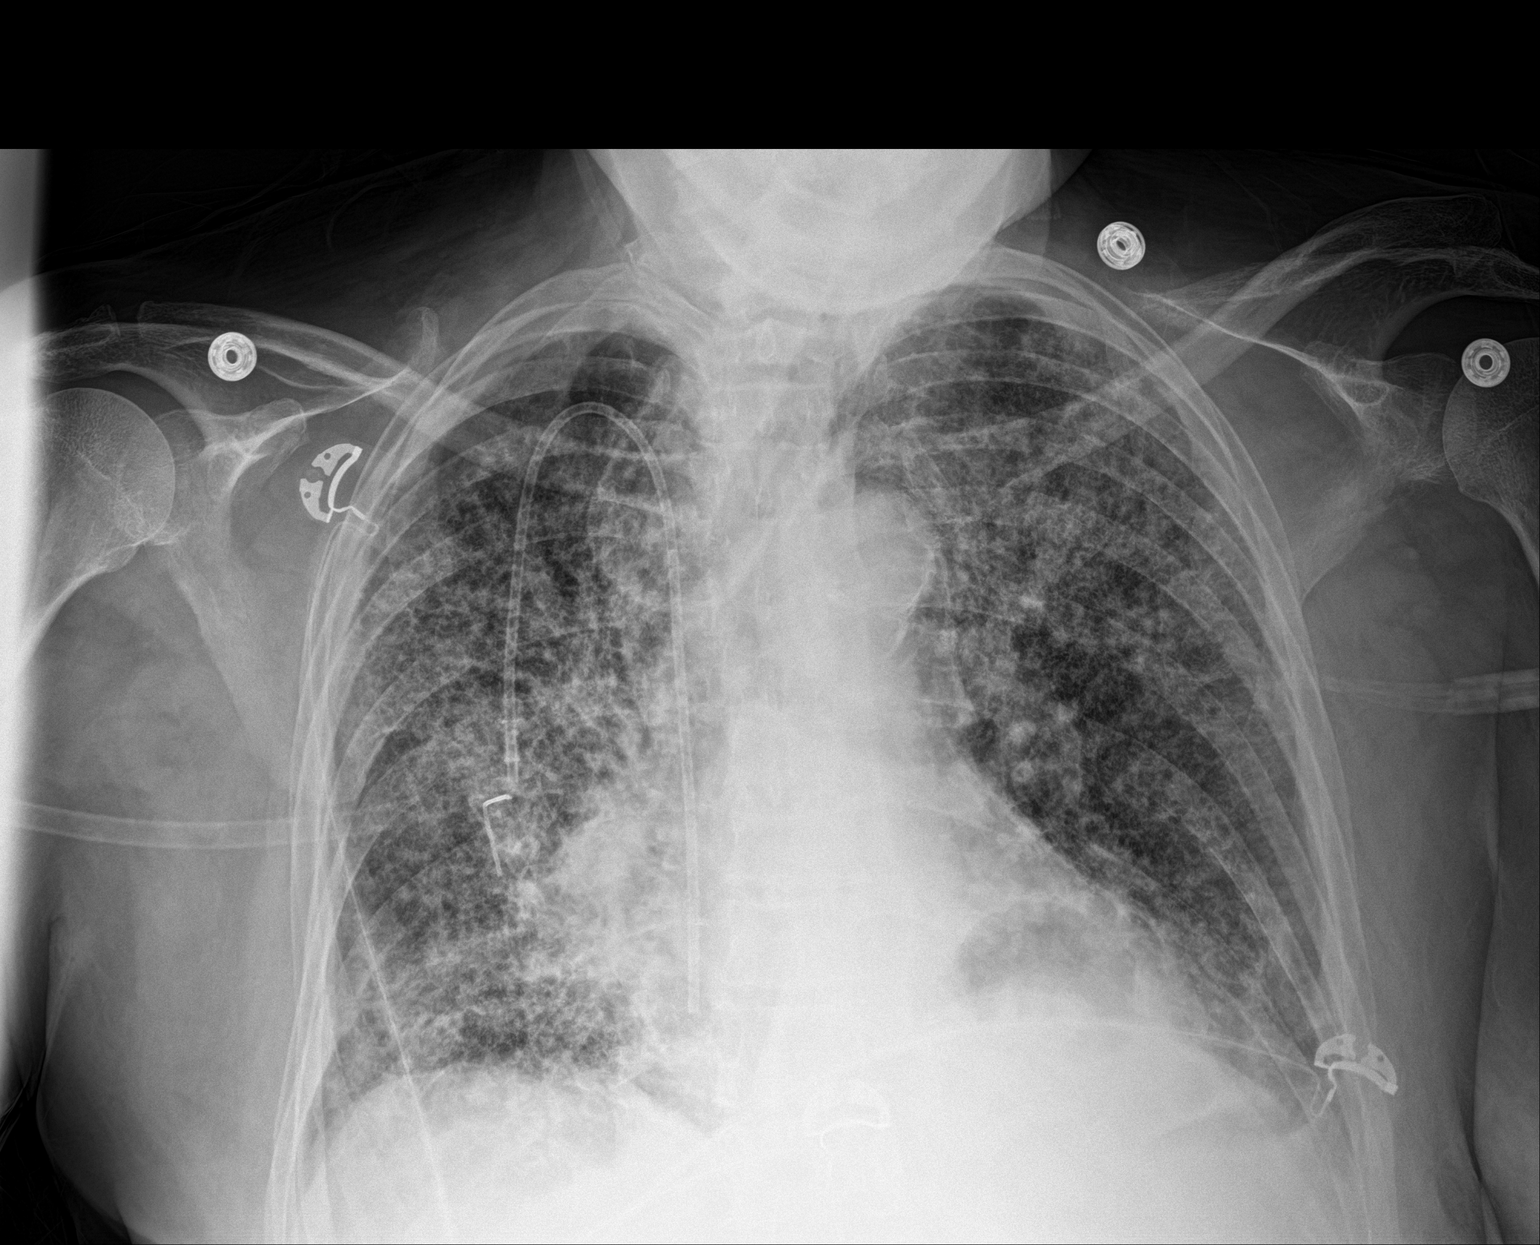

[1 of 1 positions shown; findings below may reference images not displayed]

FINDINGS: Right-sided chest wall port is again noted and stable. Cardiac
shadow is stable. Diffuse increased interstitial changes are
identified increased from the prior exam consistent with pulmonary
edema. No focal confluent infiltrate is seen. No sizable effusion is
noted. No bony abnormality is seen.
IMPRESSION: Increasing interstitial changes consistent with edema.

## 2018-06-25 IMAGING — DX DG CHEST 1V PORT
1 series · 1 of 1 positions shown · non-contrast
Comparison: 06/30/2017 and earlier.

CLINICAL DATA: 61-year-old female with metastatic lung cancer.
Shortness of breath and fever. Pulmonary emboli last month.

EXAM:
PORTABLE CHEST 1 VIEW

[chest ap]
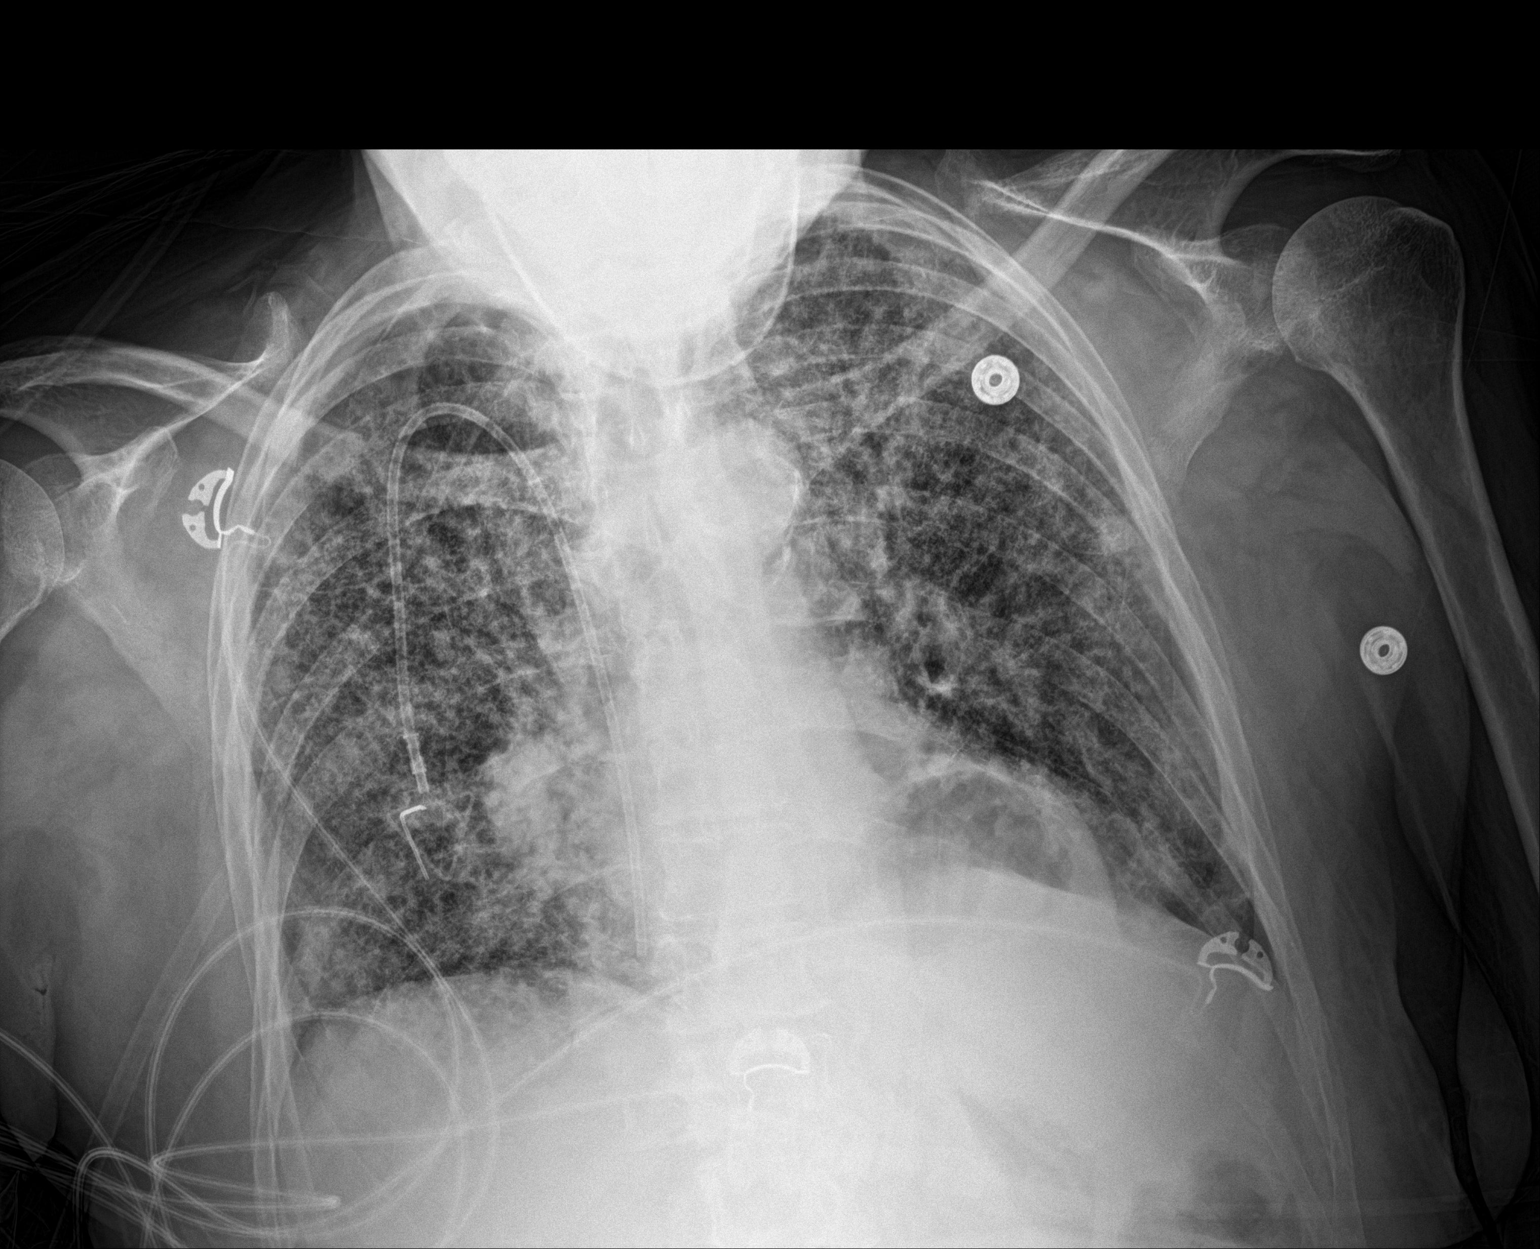

[1 of 1 positions shown; findings below may reference images not displayed]

FINDINGS: Portable AP upright view at 8111 hours. Stable right chest porta
cath, accessed. Diffuse reticulonodular pulmonary opacity has
developed since [REDACTED], is progressed since 06/28/2017, but stable
since yesterday. No associated pneumothorax or pleural effusion.
Stable cardiac size and mediastinal contours. Moderate hiatal hernia
re-demonstrated. Negative visible bowel gas pattern.
IMPRESSION: Progressed diffuse reticulonodular pulmonary opacity since
06/28/2017, stable since yesterday. Differential considerations in
this clinical setting include acute viral/atypical infection,
lymphangitic carcinomatosis, and less likely acute interstitial
edema.

## 2022-05-21 DEATH — deceased
